# Patient Record
Sex: Male | Born: 1940
Health system: Southern US, Community
[De-identification: ages and names within clinical notes are randomized; demographics above are authoritative.]

## PROBLEM LIST (undated history)

## (undated) DIAGNOSIS — I509 Heart failure, unspecified: Secondary | ICD-10-CM

## (undated) DIAGNOSIS — I119 Hypertensive heart disease without heart failure: Secondary | ICD-10-CM

## (undated) DIAGNOSIS — R9431 Abnormal electrocardiogram [ECG] [EKG]: Secondary | ICD-10-CM

## (undated) DIAGNOSIS — I714 Abdominal aortic aneurysm, without rupture, unspecified: Secondary | ICD-10-CM

## (undated) DIAGNOSIS — K920 Hematemesis: Secondary | ICD-10-CM

## (undated) DIAGNOSIS — E739 Lactose intolerance, unspecified: Secondary | ICD-10-CM

## (undated) DIAGNOSIS — N183 Chronic kidney disease, stage 3 unspecified: Secondary | ICD-10-CM

## (undated) DIAGNOSIS — E785 Hyperlipidemia, unspecified: Secondary | ICD-10-CM

## (undated) DIAGNOSIS — I255 Ischemic cardiomyopathy: Secondary | ICD-10-CM

## (undated) DIAGNOSIS — I4819 Other persistent atrial fibrillation: Secondary | ICD-10-CM

## (undated) DIAGNOSIS — I2510 Atherosclerotic heart disease of native coronary artery without angina pectoris: Secondary | ICD-10-CM

## (undated) DIAGNOSIS — R001 Bradycardia, unspecified: Secondary | ICD-10-CM

## (undated) DIAGNOSIS — R7989 Other specified abnormal findings of blood chemistry: Secondary | ICD-10-CM

## (undated) DIAGNOSIS — I739 Peripheral vascular disease, unspecified: Secondary | ICD-10-CM

## (undated) DIAGNOSIS — I6529 Occlusion and stenosis of unspecified carotid artery: Secondary | ICD-10-CM

## (undated) DIAGNOSIS — K4090 Unilateral inguinal hernia, without obstruction or gangrene, not specified as recurrent: Secondary | ICD-10-CM

## (undated) DIAGNOSIS — I4901 Ventricular fibrillation: Secondary | ICD-10-CM

## (undated) DIAGNOSIS — I340 Nonrheumatic mitral (valve) insufficiency: Secondary | ICD-10-CM

## (undated) DIAGNOSIS — D509 Iron deficiency anemia, unspecified: Secondary | ICD-10-CM

## (undated) DIAGNOSIS — K5790 Diverticulosis of intestine, part unspecified, without perforation or abscess without bleeding: Secondary | ICD-10-CM

## (undated) DIAGNOSIS — R945 Abnormal results of liver function studies: Secondary | ICD-10-CM

## (undated) DIAGNOSIS — Z8673 Personal history of transient ischemic attack (TIA), and cerebral infarction without residual deficits: Secondary | ICD-10-CM

## (undated) HISTORY — DX: Atherosclerotic heart disease of native coronary artery without angina pectoris: I25.10

## (undated) HISTORY — DX: Diverticulosis of intestine, part unspecified, without perforation or abscess without bleeding: K57.90

## (undated) HISTORY — DX: Unilateral inguinal hernia, without obstruction or gangrene, not specified as recurrent: K40.90

## (undated) HISTORY — DX: Other persistent atrial fibrillation: I48.19

## (undated) HISTORY — DX: Peripheral vascular disease, unspecified: I73.9

## (undated) HISTORY — DX: Abdominal aortic aneurysm, without rupture, unspecified: I71.40

## (undated) HISTORY — DX: Hyperlipidemia, unspecified: E78.5

## (undated) HISTORY — DX: Abdominal aortic aneurysm, without rupture: I71.4

## (undated) HISTORY — DX: Iron deficiency anemia, unspecified: D50.9

## (undated) HISTORY — DX: Chronic kidney disease, stage 3 (moderate): N18.3

## (undated) HISTORY — DX: Personal history of transient ischemic attack (TIA), and cerebral infarction without residual deficits: Z86.73

## (undated) HISTORY — DX: Chronic kidney disease, stage 3 unspecified: N18.30

## (undated) HISTORY — DX: Hypertensive heart disease without heart failure: I11.9

## (undated) HISTORY — PX: HERNIA REPAIR: SHX51

## (undated) HISTORY — PX: SKIN GRAFT: SHX250

## (undated) HISTORY — DX: Lactose intolerance, unspecified: E73.9

---

## 1999-09-06 DIAGNOSIS — K5790 Diverticulosis of intestine, part unspecified, without perforation or abscess without bleeding: Secondary | ICD-10-CM

## 1999-09-06 HISTORY — DX: Diverticulosis of intestine, part unspecified, without perforation or abscess without bleeding: K57.90

## 2002-01-10 ENCOUNTER — Inpatient Hospital Stay (HOSPITAL_COMMUNITY): Admission: EM | Admit: 2002-01-10 | Discharge: 2002-01-14 | Payer: Self-pay | Admitting: Emergency Medicine

## 2002-01-10 ENCOUNTER — Encounter: Payer: Self-pay | Admitting: Emergency Medicine

## 2002-01-11 ENCOUNTER — Encounter: Payer: Self-pay | Admitting: Internal Medicine

## 2002-01-12 ENCOUNTER — Encounter: Payer: Self-pay | Admitting: Cardiology

## 2002-01-24 ENCOUNTER — Encounter: Admission: RE | Admit: 2002-01-24 | Discharge: 2002-01-24 | Payer: Self-pay | Admitting: Internal Medicine

## 2003-06-28 ENCOUNTER — Inpatient Hospital Stay (HOSPITAL_COMMUNITY): Admission: EM | Admit: 2003-06-28 | Discharge: 2003-07-01 | Payer: Self-pay | Admitting: Emergency Medicine

## 2003-06-28 ENCOUNTER — Encounter: Payer: Self-pay | Admitting: Emergency Medicine

## 2003-06-29 ENCOUNTER — Encounter: Payer: Self-pay | Admitting: Cardiology

## 2004-09-24 ENCOUNTER — Ambulatory Visit: Payer: Self-pay | Admitting: Cardiology

## 2004-09-27 ENCOUNTER — Ambulatory Visit: Payer: Self-pay | Admitting: Cardiology

## 2004-10-04 ENCOUNTER — Ambulatory Visit: Payer: Self-pay

## 2005-03-24 ENCOUNTER — Ambulatory Visit: Payer: Self-pay | Admitting: Cardiology

## 2005-03-31 ENCOUNTER — Ambulatory Visit: Payer: Self-pay | Admitting: Cardiology

## 2005-04-06 ENCOUNTER — Ambulatory Visit: Payer: Self-pay

## 2005-12-07 ENCOUNTER — Ambulatory Visit: Payer: Self-pay | Admitting: Cardiology

## 2006-01-26 ENCOUNTER — Ambulatory Visit: Payer: Self-pay

## 2006-01-26 ENCOUNTER — Ambulatory Visit: Payer: Self-pay | Admitting: Cardiology

## 2006-04-04 ENCOUNTER — Ambulatory Visit: Payer: Self-pay | Admitting: Cardiology

## 2006-08-03 ENCOUNTER — Ambulatory Visit: Payer: Self-pay | Admitting: Cardiology

## 2007-01-07 ENCOUNTER — Inpatient Hospital Stay (HOSPITAL_COMMUNITY): Admission: EM | Admit: 2007-01-07 | Discharge: 2007-01-15 | Payer: Self-pay | Admitting: *Deleted

## 2007-01-07 ENCOUNTER — Ambulatory Visit: Payer: Self-pay | Admitting: Internal Medicine

## 2007-01-07 ENCOUNTER — Ambulatory Visit: Payer: Self-pay | Admitting: Vascular Surgery

## 2007-01-07 HISTORY — PX: POPLITEAL ARTERY ANGIOPLASTY: SHX2242

## 2007-01-09 ENCOUNTER — Ambulatory Visit: Payer: Self-pay | Admitting: Physical Medicine & Rehabilitation

## 2007-01-11 HISTORY — PX: ORIF TIBIA & FIBULA FRACTURES: SHX2131

## 2007-01-19 ENCOUNTER — Ambulatory Visit: Payer: Self-pay | Admitting: Cardiology

## 2007-02-14 ENCOUNTER — Inpatient Hospital Stay (HOSPITAL_COMMUNITY): Admission: EM | Admit: 2007-02-14 | Discharge: 2007-02-15 | Payer: Self-pay | Admitting: Orthopedic Surgery

## 2007-04-30 ENCOUNTER — Encounter: Admission: RE | Admit: 2007-04-30 | Discharge: 2007-07-29 | Payer: Self-pay | Admitting: Orthopedic Surgery

## 2007-05-14 ENCOUNTER — Ambulatory Visit: Payer: Self-pay | Admitting: Vascular Surgery

## 2007-06-23 ENCOUNTER — Emergency Department (HOSPITAL_COMMUNITY): Admission: EM | Admit: 2007-06-23 | Discharge: 2007-06-23 | Payer: Self-pay | Admitting: *Deleted

## 2007-06-25 ENCOUNTER — Emergency Department (HOSPITAL_COMMUNITY): Admission: EM | Admit: 2007-06-25 | Discharge: 2007-06-25 | Payer: Self-pay | Admitting: *Deleted

## 2007-06-27 ENCOUNTER — Emergency Department (HOSPITAL_COMMUNITY): Admission: EM | Admit: 2007-06-27 | Discharge: 2007-06-27 | Payer: Self-pay | Admitting: Emergency Medicine

## 2007-07-05 ENCOUNTER — Ambulatory Visit: Payer: Self-pay | Admitting: Cardiology

## 2007-07-10 ENCOUNTER — Ambulatory Visit: Payer: Self-pay | Admitting: Cardiology

## 2007-07-10 ENCOUNTER — Ambulatory Visit: Payer: Self-pay

## 2007-07-10 LAB — CONVERTED CEMR LAB
Creatinine, Ser: 1.5 mg/dL (ref 0.4–1.5)
Potassium: 3.6 meq/L (ref 3.5–5.1)
Sodium: 139 meq/L (ref 135–145)

## 2007-08-13 ENCOUNTER — Ambulatory Visit (HOSPITAL_COMMUNITY): Admission: RE | Admit: 2007-08-13 | Discharge: 2007-08-13 | Payer: Self-pay | Admitting: General Surgery

## 2007-08-13 ENCOUNTER — Encounter (INDEPENDENT_AMBULATORY_CARE_PROVIDER_SITE_OTHER): Payer: Self-pay | Admitting: General Surgery

## 2008-10-02 DIAGNOSIS — E785 Hyperlipidemia, unspecified: Secondary | ICD-10-CM

## 2008-10-02 DIAGNOSIS — K4090 Unilateral inguinal hernia, without obstruction or gangrene, not specified as recurrent: Secondary | ICD-10-CM | POA: Insufficient documentation

## 2008-10-02 DIAGNOSIS — I679 Cerebrovascular disease, unspecified: Secondary | ICD-10-CM | POA: Insufficient documentation

## 2008-10-02 DIAGNOSIS — I10 Essential (primary) hypertension: Secondary | ICD-10-CM

## 2008-10-02 DIAGNOSIS — E739 Lactose intolerance, unspecified: Secondary | ICD-10-CM

## 2008-10-02 DIAGNOSIS — I739 Peripheral vascular disease, unspecified: Secondary | ICD-10-CM | POA: Insufficient documentation

## 2008-10-03 ENCOUNTER — Ambulatory Visit: Payer: Self-pay | Admitting: Cardiology

## 2008-11-12 ENCOUNTER — Ambulatory Visit: Payer: Self-pay

## 2008-11-12 ENCOUNTER — Ambulatory Visit: Payer: Self-pay | Admitting: Cardiology

## 2008-11-12 LAB — CONVERTED CEMR LAB
AST: 23 units/L (ref 0–37)
Albumin: 3.7 g/dL (ref 3.5–5.2)
Alkaline Phosphatase: 105 units/L (ref 39–117)
BUN: 28 mg/dL — ABNORMAL HIGH (ref 6–23)
Chloride: 111 meq/L (ref 96–112)
Cholesterol: 119 mg/dL (ref 0–200)
GFR calc non Af Amer: 50 mL/min
LDL Cholesterol: 77 mg/dL (ref 0–99)
Potassium: 3.7 meq/L (ref 3.5–5.1)
Total Bilirubin: 0.7 mg/dL (ref 0.3–1.2)

## 2009-08-10 ENCOUNTER — Encounter: Payer: Self-pay | Admitting: Cardiology

## 2009-08-11 ENCOUNTER — Ambulatory Visit: Payer: Self-pay | Admitting: Cardiology

## 2009-08-11 DIAGNOSIS — F17200 Nicotine dependence, unspecified, uncomplicated: Secondary | ICD-10-CM

## 2009-08-25 ENCOUNTER — Encounter (INDEPENDENT_AMBULATORY_CARE_PROVIDER_SITE_OTHER): Payer: Self-pay | Admitting: *Deleted

## 2009-09-15 ENCOUNTER — Ambulatory Visit: Payer: Self-pay | Admitting: Cardiology

## 2009-09-15 ENCOUNTER — Ambulatory Visit: Payer: Self-pay

## 2009-09-15 DIAGNOSIS — E875 Hyperkalemia: Secondary | ICD-10-CM | POA: Insufficient documentation

## 2009-09-18 LAB — CONVERTED CEMR LAB
CO2: 24 meq/L (ref 19–32)
Calcium: 9.3 mg/dL (ref 8.4–10.5)
Creatinine, Ser: 1.49 mg/dL (ref 0.40–1.50)
Glucose, Bld: 63 mg/dL — ABNORMAL LOW (ref 70–99)

## 2009-09-22 LAB — CONVERTED CEMR LAB
AST: 17 units/L (ref 0–37)
Alkaline Phosphatase: 106 units/L (ref 39–117)
BUN: 26 mg/dL — ABNORMAL HIGH (ref 6–23)
Bilirubin, Direct: 0 mg/dL (ref 0.0–0.3)
CO2: 26 meq/L (ref 19–32)
Calcium: 9.6 mg/dL (ref 8.4–10.5)
Creatinine, Ser: 1.8 mg/dL — ABNORMAL HIGH (ref 0.4–1.5)
Glucose, Bld: 94 mg/dL (ref 70–99)
Total Bilirubin: 0.6 mg/dL (ref 0.3–1.2)
Total CHOL/HDL Ratio: 7

## 2010-08-13 ENCOUNTER — Ambulatory Visit: Payer: Self-pay | Admitting: Cardiology

## 2010-08-13 ENCOUNTER — Encounter: Payer: Self-pay | Admitting: Cardiology

## 2010-08-18 ENCOUNTER — Ambulatory Visit: Payer: Self-pay

## 2010-08-19 ENCOUNTER — Encounter (INDEPENDENT_AMBULATORY_CARE_PROVIDER_SITE_OTHER): Payer: Self-pay | Admitting: *Deleted

## 2010-08-26 ENCOUNTER — Ambulatory Visit: Payer: Self-pay | Admitting: Cardiology

## 2010-08-26 ENCOUNTER — Telehealth: Payer: Self-pay | Admitting: Cardiology

## 2010-08-26 DIAGNOSIS — N259 Disorder resulting from impaired renal tubular function, unspecified: Secondary | ICD-10-CM | POA: Insufficient documentation

## 2010-08-26 LAB — CONVERTED CEMR LAB
Albumin: 3.7 g/dL (ref 3.5–5.2)
CO2: 26 meq/L (ref 19–32)
Calcium: 9.5 mg/dL (ref 8.4–10.5)
Chloride: 106 meq/L (ref 96–112)
Cholesterol: 118 mg/dL (ref 0–200)
Direct LDL: 76.3 mg/dL
HDL: 26 mg/dL — ABNORMAL LOW (ref >39.00)
Sodium: 140 meq/L (ref 135–145)
Total Protein: 6.4 g/dL (ref 6.0–8.3)
Triglycerides: 203 mg/dL — ABNORMAL HIGH (ref 0.0–149.0)

## 2010-09-01 LAB — CONVERTED CEMR LAB
BUN: 15 mg/dL (ref 6–23)
CO2: 25 meq/L (ref 19–32)
Chloride: 106 meq/L (ref 96–112)
Creatinine, Ser: 1.5 mg/dL (ref 0.4–1.5)
Glucose, Bld: 75 mg/dL (ref 70–99)

## 2010-10-04 ENCOUNTER — Ambulatory Visit: Admit: 2010-10-04 | Payer: Self-pay | Admitting: Cardiology

## 2010-10-05 NOTE — Assessment & Plan Note (Signed)
Summary: yearly/sl   History of Present Illness: Logan White is a very pleasant gentleman who has a history of PCI of his LAD in October 2004.  His last Myoview was performed on November 12, 2008. Ejection fraction was 47%. There was a small area of potential ischemia at the infero-apex. It was felt to be low risk and we are treating medically.  He also has cerebrovascular disease. Last carotid Doppler performed in Jan 2011 showed 60-79% bilateral stenosis. Followup was recommended in 12 months. Note abdominal ultrasound in May of 2007 showed no aneurysm. I last saw him in Dec 2010. Since then the patient denies any dyspnea on exertion, orthopnea, PND, pedal edema, palpitations, syncope or chest pain.  Current Medications (verified): 1)  Hydrochlorothiazide 12.5 Mg Tabs (Hydrochlorothiazide) .... Take 1 Tablet By Mouth Once A  Day 2)  Lisinopril 40 Mg Tabs (Lisinopril) .... Take 1 Tablet By Mouth Once A Day 3)  Zocor 40 Mg Tabs (Simvastatin) .... Take 1 Tablet By Mouth At Bedtime 4)  Aspirin 81 Mg Tbec (Aspirin) .... Take One Tablet By Mouth Daily  Past History:  Past Medical History: Reviewed history from 10/02/2008 and no changes required. INGUINAL HERNIA (ICD-550.90) chronic GLUCOSE INTOLERANCE (ICD-271.3)question of DM PERIPHERAL VASCULAR DISEASE (ICD-443.9) CEREBROVASCULAR DISEASE (ICD-437.9) HYPERLIPIDEMIA-MIXED (ICD-272.4) HYPERTENSION (ICD-401.9) CAD (ICD-414.01 Status post aborted anterior ST elevation myocardial        infarction in October of 2004, with PTCA and stenting with a        CYPHER drug-eluting stent to the LAD.  There was normal ejection        fraction.   Past Surgical History: Reviewed history from 10/02/2008 and no changes required.  PROCEDURES:   1. Status post left popliteal artery exploration and vein patch       angioplasty, per Dr. Donnetta Hutching, secondary to ischemic left foot related       to left popliteal artery injury on Jan 07, 2007.   2. Irrigation and  debridement of left open proximal tibia fracture       with significant soft tissue injury noted and I & D of soft tissue       and muscle over the left forearm with closure over drain on Jan 07, 2007.   3. On Jan 11, 2007, open reduction internal fixation, tibia shaft, open       treatment of unicondylar plateau fracture, irrigation/debridement       of open fracture including bone, removal of external fixator under       anesthesia per Dr. Altamese Lennox  Social History: Reviewed history from 10/02/2008 and no changes required. 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 half to one pack of cigarettes a day Alcohol Use - no  Review of Systems       no fevers or chills, productive cough, hemoptysis, dysphasia, odynophagia, melena, hematochezia, dysuria, hematuria, rash, seizure activity, orthopnea, PND, pedal edema, claudication. Remaining systems are negative.   Vital Signs:  Patient profile:   70 year old male Height:      75 inches Weight:      183 pounds BMI:     22.96 Pulse rate:   53 / minute Resp:     14 per minute BP sitting:   119 / 65  (left arm)  Vitals Entered By: Burnett Kanaris (August 13, 2010 9:24 AM)  Physical Exam  General:  Well-developed well-nourished in no acute distress.  Skin is warm and dry.  HEENT is normal.  Neck is supple. No thyromegaly.  Chest is clear to auscultation with normal expansion.  Cardiovascular exam is regular rate and rhythm. 2/6 systolic ejection murmur. Abdominal exam nontender or distended. No masses palpated. Extremities show no edema. neuro grossly intact    EKG  Procedure date:  08/13/2010  Findings:      Sus bradycardia with first degree AV block. Nonspecific ST changes.  Impression & Recommendations:  Problem # 1:  TOBACCO ABUSE (ICD-305.1) Patient counseled on discontinuing.  Problem # 2:  PERIPHERAL VASCULAR DISEASE (ICD-443.9) Patient with known cerebrovascular disease.  Followup carotid Dopplers January 2012. Continue aspirin and statin.  Problem # 3:  HYPERLIPIDEMIA-MIXED (P102836.4)  Continue statin. Check lipids and liver. The following medications were removed from the medication list:    Lipitor 40 Mg Tabs (Atorvastatin calcium) .Marland Kitchen... Take one tablet by mouth daily. His updated medication list for this problem includes:    Zocor 40 Mg Tabs (Simvastatin) .Marland Kitchen... Take 1 tablet by mouth at bedtime  Orders: TLB-Hepatic/Liver Function Pnl (80076-HEPATIC) TLB-BMP (Basic Metabolic Panel-BMET) (99991111)  The following medications were removed from the medication list:    Lipitor 40 Mg Tabs (Atorvastatin calcium) .Marland Kitchen... Take one tablet by mouth daily. His updated medication list for this problem includes:    Zocor 40 Mg Tabs (Simvastatin) .Marland Kitchen... Take 1 tablet by mouth at bedtime  Problem # 4:  HYPERTENSION (ICD-401.9) Blood pressure controlled on present medications. Will continue. Check potassium and renal function. His updated medication list for this problem includes:    Hydrochlorothiazide 12.5 Mg Tabs (Hydrochlorothiazide) .Marland Kitchen... Take 1 tablet by mouth once a  day    Lisinopril 40 Mg Tabs (Lisinopril) .Marland Kitchen... Take 1 tablet by mouth once a day    Aspirin 81 Mg Tbec (Aspirin) .Marland Kitchen... Take one tablet by mouth daily  Problem # 5:  CAD (ICD-414.00) Continue aspirin, ACE inhibitor and statin. His updated medication list for this problem includes:    Lisinopril 40 Mg Tabs (Lisinopril) .Marland Kitchen... Take 1 tablet by mouth once a day    Aspirin 81 Mg Tbec (Aspirin) .Marland Kitchen... Take one tablet by mouth daily  Orders: TLB-BMP (Basic Metabolic Panel-BMET) (99991111)  His updated medication list for this problem includes:    Lisinopril 40 Mg Tabs (Lisinopril) .Marland Kitchen... Take 1 tablet by mouth once a day    Aspirin 81 Mg Tbec (Aspirin) .Marland Kitchen... Take one tablet by mouth daily  Other Orders: Carotid Duplex (Carotid Duplex)  Patient Instructions: 1)  Your physician  recommends that you schedule a follow-up appointment in: 1year with Dr. [name]. 2)  Your physician recommends that you return for lab work in: Today for Liver and Lipid panel as well as a BMET 3)  Your physician has requested that you have a carotid duplex. Dr. [name] would like for you to have a Carotid Duplex done in 09/2010. This test is an ultrasound of the carotid arteries in your neck. It looks at blood flow through these arteries that supply the brain with blood. Allow one hour for this exam. There are no restrictions or special instructions.

## 2010-10-07 NOTE — Miscellaneous (Signed)
Summary: RX Niaspan 500mg  for  4 weeks/1000mg  thereafter  Clinical Lists Changes  Medications: Added new medication of NIASPAN 500 MG CR-TABS (NIACIN (ANTIHYPERLIPIDEMIC)) one at bedtime for 4 weeks then increase to 1000 mg thereafter - Signed Added new medication of NIASPAN 1000 MG CR-TABS (NIACIN (ANTIHYPERLIPIDEMIC)) one every evening after completing 4 weeks of 500 mg - Signed Rx of NIASPAN 500 MG CR-TABS (NIACIN (ANTIHYPERLIPIDEMIC)) one at bedtime for 4 weeks then increase to 1000 mg thereafter;  #30 x 0;  Signed;  Entered by: Sim Boast, RN;  Authorized by: Colin Mulders, MD, [facility];  Method used: Faxed to [facility] [address] [place], [place]  [address], Ph: [phone], Fax: [phone] Rx of NIASPAN 1000 MG CR-TABS (NIACIN (ANTIHYPERLIPIDEMIC)) one every evening after completing 4 weeks of 500 mg;  #30 x 11;  Signed;  Entered by: Sim Boast, RN;  Authorized by: Colin Mulders, MD, [facility];  Method used: Faxed to [facility] [address], [address] [place], [place]  [address], Ph: [phone], Fax: [phone]    Prescriptions: NIASPAN 1000 MG CR-TABS (NIACIN (ANTIHYPERLIPIDEMIC)) one every evening after completing 4 weeks of 500 mg  #30 x 11   Entered by:   Sim Boast, RN   Authorized by:   Colin Mulders, MD, [facility]   Signed by:   Sim Boast, RN on 08/26/2010   Method used:   Faxed to ...       [facility] [phone] (retail)       [place], [place]  [address]       Ph: [phone]       Fax: [phone]   RxID:   [id number] NIASPAN 500 MG CR-TABS (NIACIN (ANTIHYPERLIPIDEMIC)) one at bedtime for 4 weeks then increase to 1000 mg thereafter  #30 x 0   Entered by:   Sim Boast, RN   Authorized by:   Colin Mulders, MD, [facility]   Signed by:   Sim Boast, RN on 08/26/2010   Method used:   Faxed to ...       [facility] [address] (retail)       [address]       [place], [place]  [address]       Ph: [phone]       Fax: [phone]   RxID:   [id number]

## 2010-10-07 NOTE — Letter (Signed)
Summary: [facility] [place] [address] [place]   [place], [place] [address]   Phone: [phone]  Fax: [phone]     August 19, 2010 MRN: [id number]   Logan White [place], [place]  [address]   Dear Mr. ABELLERA,  We have reviewed your cholesterol results.  They are as follows:     Total Cholesterol:    118 (Desirable: less than 200)       HDL  Cholesterol:     26.00  (Desirable: greater than 40 for men and 50 for women)       LDL Cholesterol:       136  (Desirable: less than 100 for low risk and less than 70 for moderate to high risk)       Triglycerides:       203.0  (Desirable: less than 150)  Our recommendations include:THESE NUMBERS LOOK GOOD EXCEPT FOR THE HDL. DR CRENSHAW WOULD LIKE FOR YOU TO TAKE A MEDICATION THAT CAN RAISE THE HDL NUMBER. PLEASE CALL SO WE CAN DISCUSS. DR CRENSHAW/DEBRA   Call our office at the number listed above if you have any questions.  Lowering your LDL cholesterol is important, but it is only one of a large number of "risk factors" that may indicate that you are at risk for heart disease, stroke or other complications of hardening of the arteries.  Other risk factors include:   A.  Cigarette Smoking* B.  High Blood Pressure* C.  Obesity* D.   Low HDL Cholesterol (see yours above)* E.   Diabetes Mellitus (higher risk if your is uncontrolled) F.  Family history of premature heart disease G.  Previous history of stroke or cardiovascular disease    *These are risk factors YOU HAVE CONTROL OVER.  For more information, visit .  There is now evidence that lowering the TOTAL CHOLESTEROL AND LDL CHOLESTEROL can reduce the risk of heart disease.  The American Heart Association recommends the following guidelines for the treatment of elevated cholesterol:  1.  If there is now current heart disease and less than two risk factors, TOTAL CHOLESTEROL should be less than 200 and LDL CHOLESTEROL  should be less than 100. 2.  If there is current heart disease or two or more risk factors, TOTAL CHOLESTEROL should be less than 200 and LDL CHOLESTEROL should be less than 70.  A diet low in cholesterol, saturated fat, and calories is the cornerstone of treatment for elevated cholesterol.  Cessation of smoking and exercise are also important in the management of elevated cholesterol and preventing vascular disease.  Studies have shown that 30 to 60 minutes of physical activity most days can help lower blood pressure, lower cholesterol, and keep your weight at a healthy level.  Drug therapy is used when cholesterol levels do not respond to therapeutic lifestyle changes (smoking cessation, diet, and exercise) and remains unacceptably high.  If medication is started, it is important to have you levels checked periodically to evaluate the need for further treatment options.  Thank you,    [facility]

## 2010-10-07 NOTE — Progress Notes (Signed)
Summary: Problems with "sex drive"  Phone Note Other Incoming   Caller: pt Details for Reason: Problems with "sex drive" Summary of Call: pt here for labs and complains of difficulty with his "sex drive".  Would like to know if his medication would be causing this and if he can possibly use Viagra or Cialis.  Pt aware Dr [name] not in the office today but that I will forward this information to him for his review and we  will contact him in follow-up.  Pt is in agreement. Initial call taken by: Sim Boast, RN,  August 26, 2010 11:10 AM  Follow-up for Phone Call        pt not on beta blocker; may take viagra but would need to get from his primary care; instruct pt to avoid ntg for 24 hours after taking Colin Mulders, MD, [facility]  August 26, 2010 12:01 PM  pt aware Fredia Beets, RN  September 01, 2010 10:56 AM

## 2010-12-28 ENCOUNTER — Inpatient Hospital Stay (HOSPITAL_COMMUNITY)
Admission: EM | Admit: 2010-12-28 | Discharge: 2011-01-01 | DRG: 690 | Disposition: A | Discharge status: Home / Self Care | Payer: Medicare Other | Attending: Internal Medicine | Admitting: Internal Medicine

## 2010-12-28 DIAGNOSIS — Z23 Encounter for immunization: Secondary | ICD-10-CM

## 2010-12-28 DIAGNOSIS — N181 Chronic kidney disease, stage 1: Secondary | ICD-10-CM | POA: Diagnosis present

## 2010-12-28 DIAGNOSIS — Z87891 Personal history of nicotine dependence: Secondary | ICD-10-CM

## 2010-12-28 DIAGNOSIS — I129 Hypertensive chronic kidney disease with stage 1 through stage 4 chronic kidney disease, or unspecified chronic kidney disease: Secondary | ICD-10-CM | POA: Diagnosis present

## 2010-12-28 DIAGNOSIS — I252 Old myocardial infarction: Secondary | ICD-10-CM

## 2010-12-28 DIAGNOSIS — I4891 Unspecified atrial fibrillation: Secondary | ICD-10-CM | POA: Diagnosis present

## 2010-12-28 DIAGNOSIS — E119 Type 2 diabetes mellitus without complications: Secondary | ICD-10-CM | POA: Diagnosis present

## 2010-12-28 DIAGNOSIS — Z7982 Long term (current) use of aspirin: Secondary | ICD-10-CM

## 2010-12-28 DIAGNOSIS — A088 Other specified intestinal infections: Secondary | ICD-10-CM | POA: Diagnosis present

## 2010-12-28 DIAGNOSIS — R1310 Dysphagia, unspecified: Secondary | ICD-10-CM | POA: Diagnosis present

## 2010-12-28 DIAGNOSIS — Z8249 Family history of ischemic heart disease and other diseases of the circulatory system: Secondary | ICD-10-CM

## 2010-12-28 DIAGNOSIS — I4892 Unspecified atrial flutter: Secondary | ICD-10-CM | POA: Diagnosis not present

## 2010-12-28 DIAGNOSIS — Z79899 Other long term (current) drug therapy: Secondary | ICD-10-CM

## 2010-12-28 DIAGNOSIS — Z9861 Coronary angioplasty status: Secondary | ICD-10-CM

## 2010-12-28 DIAGNOSIS — E876 Hypokalemia: Secondary | ICD-10-CM | POA: Diagnosis not present

## 2010-12-28 DIAGNOSIS — E860 Dehydration: Secondary | ICD-10-CM | POA: Diagnosis present

## 2010-12-28 DIAGNOSIS — Z8781 Personal history of (healed) traumatic fracture: Secondary | ICD-10-CM

## 2010-12-28 DIAGNOSIS — R319 Hematuria, unspecified: Secondary | ICD-10-CM | POA: Diagnosis present

## 2010-12-28 DIAGNOSIS — I739 Peripheral vascular disease, unspecified: Secondary | ICD-10-CM | POA: Diagnosis present

## 2010-12-28 DIAGNOSIS — N390 Urinary tract infection, site not specified: Principal | ICD-10-CM | POA: Diagnosis present

## 2010-12-28 DIAGNOSIS — E785 Hyperlipidemia, unspecified: Secondary | ICD-10-CM | POA: Diagnosis present

## 2010-12-28 DIAGNOSIS — I2510 Atherosclerotic heart disease of native coronary artery without angina pectoris: Secondary | ICD-10-CM | POA: Diagnosis present

## 2010-12-28 DIAGNOSIS — Z8673 Personal history of transient ischemic attack (TIA), and cerebral infarction without residual deficits: Secondary | ICD-10-CM

## 2010-12-28 LAB — COMPREHENSIVE METABOLIC PANEL
ALT: 27 U/L (ref 0–53)
Alkaline Phosphatase: 152 U/L — ABNORMAL HIGH (ref 39–117)
CO2: 26 mEq/L (ref 19–32)
Calcium: 10.4 mg/dL (ref 8.4–10.5)
GFR calc non Af Amer: 36 mL/min — ABNORMAL LOW (ref >60)
Glucose, Bld: 174 mg/dL — ABNORMAL HIGH (ref 70–99)
Sodium: 141 mEq/L (ref 135–145)

## 2010-12-28 LAB — URINALYSIS, ROUTINE W REFLEX MICROSCOPIC
Nitrite: NEGATIVE
Specific Gravity, Urine: 1.039 — ABNORMAL HIGH (ref 1.005–1.030)
Urobilinogen, UA: 0.2 mg/dL (ref 0.0–1.0)

## 2010-12-28 LAB — CBC
Platelets: 236 10*3/uL (ref 150–400)
RDW: 14.4 % (ref 11.5–15.5)
WBC: 8.8 10*3/uL (ref 4.0–10.5)

## 2010-12-28 LAB — URINE MICROSCOPIC-ADD ON

## 2010-12-29 DIAGNOSIS — I369 Nonrheumatic tricuspid valve disorder, unspecified: Secondary | ICD-10-CM

## 2010-12-29 LAB — CBC
HCT: 50 % (ref 39.0–52.0)
HCT: 44.7 % (ref 39.0–52.0)
Hemoglobin: 15.8 g/dL (ref 13.0–17.0)
MCH: 26.7 pg (ref 26.0–34.0)
MCH: 26 pg (ref 26.0–34.0)
MCV: 73.4 fL — ABNORMAL LOW (ref 78.0–100.0)
MCV: 73.6 fL — ABNORMAL LOW (ref 78.0–100.0)
Platelets: 265 10*3/uL (ref 150–400)
Platelets: 259 10*3/uL (ref 150–400)
Platelets: 261 10*3/uL (ref 150–400)
RBC: 6.51 MIL/uL — ABNORMAL HIGH (ref 4.22–5.81)
RBC: 6.07 MIL/uL — ABNORMAL HIGH (ref 4.22–5.81)
RDW: 14.4 % (ref 11.5–15.5)
RDW: 14.5 % (ref 11.5–15.5)
WBC: 10.3 10*3/uL (ref 4.0–10.5)
WBC: 12.9 10*3/uL — ABNORMAL HIGH (ref 4.0–10.5)
WBC: 12.1 10*3/uL — ABNORMAL HIGH (ref 4.0–10.5)

## 2010-12-29 LAB — DIFFERENTIAL
Basophils Absolute: 0 10*3/uL (ref 0.0–0.1)
Basophils Absolute: 0.1 10*3/uL (ref 0.0–0.1)
Basophils Relative: 1 % (ref 0–1)
Eosinophils Absolute: 0 10*3/uL (ref 0.0–0.7)
Eosinophils Relative: 0 % (ref 0–5)
Lymphocytes Relative: 21 % (ref 12–46)
Lymphs Abs: 1.4 10*3/uL (ref 0.7–4.0)
Lymphs Abs: 2.2 10*3/uL (ref 0.7–4.0)
Monocytes Absolute: 0.4 10*3/uL (ref 0.1–1.0)
Monocytes Relative: 5 % (ref 3–12)
Monocytes Relative: 11 % (ref 3–12)
Neutro Abs: 6.9 10*3/uL (ref 1.7–7.7)
Neutrophils Relative %: 67 % (ref 43–77)
nRBC: 0 /100 WBC

## 2010-12-29 LAB — LIPID PANEL
Cholesterol: 222 mg/dL — ABNORMAL HIGH (ref 0–200)
LDL Cholesterol: 150 mg/dL — ABNORMAL HIGH (ref 0–99)
Total CHOL/HDL Ratio: 3.7 RATIO

## 2010-12-29 LAB — CK TOTAL AND CKMB (NOT AT ARMC)
CK, MB: 9.8 ng/mL (ref 0.3–4.0)
Relative Index: 2.7 — ABNORMAL HIGH (ref 0.0–2.5)
Total CK: 367 U/L — ABNORMAL HIGH (ref 7–232)

## 2010-12-29 LAB — CARDIAC PANEL(CRET KIN+CKTOT+MB+TROPI)
Total CK: 814 U/L — ABNORMAL HIGH (ref 7–232)
Troponin I: 0.06 ng/mL (ref 0.00–0.06)

## 2010-12-29 LAB — GLUCOSE, CAPILLARY
Glucose-Capillary: 110 mg/dL — ABNORMAL HIGH (ref 70–99)
Glucose-Capillary: 136 mg/dL — ABNORMAL HIGH (ref 70–99)

## 2010-12-29 LAB — SAMPLE TO BLOOD BANK

## 2010-12-29 LAB — TROPONIN I: Troponin I: 0.05 ng/mL (ref 0.00–0.06)

## 2010-12-29 LAB — HEMOGLOBIN A1C
Hgb A1c MFr Bld: 5.7 % — ABNORMAL HIGH (ref <5.7)
Mean Plasma Glucose: 117 mg/dL — ABNORMAL HIGH (ref <117)

## 2010-12-29 LAB — APTT: aPTT: 33 seconds (ref 24–37)

## 2010-12-29 LAB — MRSA PCR SCREENING: MRSA by PCR: POSITIVE — AB

## 2010-12-30 LAB — COMPREHENSIVE METABOLIC PANEL
ALT: 22 U/L (ref 0–53)
Alkaline Phosphatase: 119 U/L — ABNORMAL HIGH (ref 39–117)
Chloride: 98 mEq/L (ref 96–112)
Glucose, Bld: 128 mg/dL — ABNORMAL HIGH (ref 70–99)
Potassium: 3.7 mEq/L (ref 3.5–5.1)
Sodium: 139 mEq/L (ref 135–145)
Total Bilirubin: 1 mg/dL (ref 0.3–1.2)
Total Protein: 7.6 g/dL (ref 6.0–8.3)

## 2010-12-30 LAB — CBC
HCT: 46 % (ref 39.0–52.0)
Hemoglobin: 16.8 g/dL (ref 13.0–17.0)
RBC: 6.23 MIL/uL — ABNORMAL HIGH (ref 4.22–5.81)
RDW: 14.5 % (ref 11.5–15.5)
WBC: 13.3 10*3/uL — ABNORMAL HIGH (ref 4.0–10.5)

## 2010-12-30 LAB — GLUCOSE, CAPILLARY
Glucose-Capillary: 111 mg/dL — ABNORMAL HIGH (ref 70–99)
Glucose-Capillary: 123 mg/dL — ABNORMAL HIGH (ref 70–99)
Glucose-Capillary: 127 mg/dL — ABNORMAL HIGH (ref 70–99)

## 2010-12-31 ENCOUNTER — Inpatient Hospital Stay (HOSPITAL_COMMUNITY): Payer: Medicare Other

## 2010-12-31 DIAGNOSIS — I4891 Unspecified atrial fibrillation: Secondary | ICD-10-CM

## 2010-12-31 LAB — URINALYSIS, ROUTINE W REFLEX MICROSCOPIC
Glucose, UA: NEGATIVE mg/dL
Ketones, ur: NEGATIVE mg/dL
Leukocytes, UA: NEGATIVE
Nitrite: NEGATIVE
Specific Gravity, Urine: 1.024 (ref 1.005–1.030)
pH: 6 (ref 5.0–8.0)

## 2010-12-31 LAB — URINE MICROSCOPIC-ADD ON

## 2010-12-31 LAB — GLUCOSE, CAPILLARY
Glucose-Capillary: 118 mg/dL — ABNORMAL HIGH (ref 70–99)
Glucose-Capillary: 113 mg/dL — ABNORMAL HIGH (ref 70–99)
Glucose-Capillary: 119 mg/dL — ABNORMAL HIGH (ref 70–99)

## 2010-12-31 LAB — BASIC METABOLIC PANEL
CO2: 25 mEq/L (ref 19–32)
Calcium: 9 mg/dL (ref 8.4–10.5)
Chloride: 102 mEq/L (ref 96–112)
GFR calc Af Amer: >60 mL/min (ref >60)
Sodium: 136 mEq/L (ref 135–145)

## 2010-12-31 LAB — CBC
Hemoglobin: 17.3 g/dL — ABNORMAL HIGH (ref 13.0–17.0)
Platelets: 255 10*3/uL (ref 150–400)
RBC: 6.42 MIL/uL — ABNORMAL HIGH (ref 4.22–5.81)

## 2011-01-01 LAB — BASIC METABOLIC PANEL
Calcium: 8.8 mg/dL (ref 8.4–10.5)
Creatinine, Ser: 1.21 mg/dL (ref 0.4–1.5)
GFR calc Af Amer: >60 mL/min (ref >60)
GFR calc non Af Amer: 60 mL/min — ABNORMAL LOW (ref >60)
Sodium: 136 mEq/L (ref 135–145)

## 2011-01-01 LAB — GLUCOSE, CAPILLARY: Glucose-Capillary: 103 mg/dL — ABNORMAL HIGH (ref 70–99)

## 2011-01-01 LAB — MAGNESIUM: Magnesium: 2 mg/dL (ref 1.5–2.5)

## 2011-01-01 LAB — CBC
Hemoglobin: 16.5 g/dL (ref 13.0–17.0)
MCH: 26.6 pg (ref 26.0–34.0)
MCHC: 35.9 g/dL (ref 30.0–36.0)
Platelets: 249 10*3/uL (ref 150–400)
RDW: 14.4 % (ref 11.5–15.5)

## 2011-01-02 LAB — URINE CULTURE
Culture: NO GROWTH
Special Requests: NEGATIVE
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
## 2011-01-11 ENCOUNTER — Encounter: Payer: Self-pay | Admitting: Physician Assistant

## 2011-01-13 ENCOUNTER — Encounter: Payer: Self-pay | Admitting: Physician Assistant

## 2011-01-13 ENCOUNTER — Ambulatory Visit (INDEPENDENT_AMBULATORY_CARE_PROVIDER_SITE_OTHER): Payer: Medicare Other | Admitting: Physician Assistant

## 2011-01-13 ENCOUNTER — Telehealth: Payer: Self-pay | Admitting: *Deleted

## 2011-01-13 VITALS — BP 158/80 | HR 50 | Resp 18 | Ht 75.0 in | Wt 175.8 lb

## 2011-01-13 DIAGNOSIS — R0989 Other specified symptoms and signs involving the circulatory and respiratory systems: Secondary | ICD-10-CM

## 2011-01-13 DIAGNOSIS — I10 Essential (primary) hypertension: Secondary | ICD-10-CM

## 2011-01-13 DIAGNOSIS — R0609 Other forms of dyspnea: Secondary | ICD-10-CM

## 2011-01-13 DIAGNOSIS — N259 Disorder resulting from impaired renal tubular function, unspecified: Secondary | ICD-10-CM

## 2011-01-13 DIAGNOSIS — E785 Hyperlipidemia, unspecified: Secondary | ICD-10-CM

## 2011-01-13 DIAGNOSIS — I4891 Unspecified atrial fibrillation: Secondary | ICD-10-CM

## 2011-01-13 DIAGNOSIS — R0600 Dyspnea, unspecified: Secondary | ICD-10-CM

## 2011-01-13 DIAGNOSIS — I679 Cerebrovascular disease, unspecified: Secondary | ICD-10-CM

## 2011-01-13 DIAGNOSIS — K920 Hematemesis: Secondary | ICD-10-CM

## 2011-01-13 LAB — BASIC METABOLIC PANEL
GFR: 57.67 mL/min — ABNORMAL LOW (ref >60.00)
Potassium: 4.2 mEq/L (ref 3.5–5.1)
Sodium: 143 mEq/L (ref 135–145)

## 2011-01-13 LAB — BRAIN NATRIURETIC PEPTIDE: Pro B Natriuretic peptide (BNP): 446 pg/mL — ABNORMAL HIGH (ref 0.0–100.0)

## 2011-01-13 MED ORDER — FAMOTIDINE 20 MG PO TABS: 20.0000 mg | ORAL_TABLET | Freq: Two times a day (BID) | ORAL | Status: DC

## 2011-01-13 MED ORDER — POTASSIUM CHLORIDE ER 10 MEQ PO TBCR: 10.0000 meq | EXTENDED_RELEASE_TABLET | Freq: Every day | ORAL | Status: DC

## 2011-01-13 MED ORDER — SIMVASTATIN 40 MG PO TABS: 40.0000 mg | ORAL_TABLET | Freq: Every evening | ORAL | Status: DC

## 2011-01-13 MED ORDER — FUROSEMIDE 40 MG PO TABS: 40.0000 mg | ORAL_TABLET | Freq: Every day | ORAL | Status: DC

## 2011-01-13 MED ORDER — DILTIAZEM HCL ER COATED BEADS 180 MG PO CP24: 240.0000 mg | ORAL_CAPSULE | Freq: Every day | ORAL | Status: DC

## 2011-01-13 NOTE — Assessment & Plan Note (Signed)
He is maintaining normal sinus rhythm.  He is somewhat bradycardic.  This may be contributing to his dyspnea and I have recommended decreasing his Cardizem to 180 mg daily.  He has a high stroke risk profile.  His CHADS2-VASc score is 3.  However with his recent hematemesis I am not certain that Coumadin should be started currently.  I would like him to see gastroenterology as his problems with nausea and vomiting seemed to be more chronic than just gastroenteritis.  I will place him on a 48-hour Holter monitor to assess for recurrent atrial fibrillation.  He can continue on aspirin for now.  I will bring him back to see Dr. [name] in several weeks.

## 2011-01-13 NOTE — Assessment & Plan Note (Signed)
Follow up with a basic metabolic panel today.  If his renal function is stable, I will try to reinitiate his ACE inhibitor for blood pressure control.

## 2011-01-13 NOTE — Assessment & Plan Note (Signed)
His lipid panel during his hospitalization was as follows: TC 222, TG 61, HDL 60, LDL 150.  His simvastatin was held at discharge for unclear reasons.  He will restart this and we will need to arrange follow up on his lipids his future appointment.

## 2011-01-13 NOTE — Patient Instructions (Addendum)
Your physician recommends that you schedule a follow-up appointment in: [place] DR. CRENSHAW AS PER SCOTT WEAVER, PA-C.  Your physician has requested that you have a carotid duplex 437.9 . This test is an ultrasound of the carotid arteries in your neck. It looks at blood flow through these arteries that supply the brain with blood. Allow one hour for this exam. There are no restrictions or special instructions.  Your physician recommends that you return for lab work in: [place], BNP, [place] 786.05  Your physician has recommended you make the following change in your medication: DECREASE CARDIZEM TO 180 MG 1 TABLET ONCE DAILY. START PEPCID 20 MG 1 TABLET TWICE DAILY.  Your physician has recommended that you wear a 48 HOUR holter monitor 427.31. Holter monitors are medical devices that record the heart's electrical activity. Doctors most often use these monitors to diagnose arrhythmias. Arrhythmias are problems with the speed or rhythm of the heartbeat. The monitor is a small, portable device. You can wear one while you do your normal daily activities. This is usually used to diagnose what is causing palpitations/syncope (passing out).  You have been referred to GI FOR NAUSEA AN VOMITING, [place] 4/24/-01/01/11 WITH HEMATEMESIS

## 2011-01-13 NOTE — Assessment & Plan Note (Addendum)
As noted, I think he needs evaluation with gastroenterology.  He has had problems with nausea and vomiting in the past as well as the episode that sent him to the hospital.  He denies any dysphagia or odynophagia.  He's had some questionable weight loss.  His barium swallow in the hospital was normal.  I've asked him to start taking Pepcid 20 mg twice a day.  It is possible he had a MW tear from the vomiting.  But, with the need for coumadin, I think he should be seen by GI first.

## 2011-01-13 NOTE — Assessment & Plan Note (Signed)
As noted, if his renal function is stable, I will restart his ACE inhibitor.

## 2011-01-13 NOTE — Telephone Encounter (Signed)
Repeat bmet, bnp 01/21/11

## 2011-01-13 NOTE — Progress Notes (Signed)
History of Present Illness: Primary Cardiologist: Dr. Kirk Ruths  Logan White is a 70 y.o. male with a history of PCI of his LAD in October 2004.  His last Myoview was performed on November 12, 2008. Ejection fraction was 47%. There was a small area of potential ischemia at the infero-apex. It was felt to be low risk and we are treating medically.  He also has cerebrovascular disease. Last carotid Doppler performed in Jan 2011 showed 60-79% bilateral stenosis. Followup was recommended in 12 months but I do not see that this has been done yet.  He was admitted to [facility] 4/24-4/28 with N/V and hematemesis and new onset AFib with RVR.  He was seen by cardiology.  Coumadin was deferred due to hematemesis.  He was not seen by GI.  He did have a barium swallow due to dysphagia which was normal.  His N/V was felt to be due to a gastroenteritis.  He was also noted to have a UTI which was treated with antibx's.  He did develop an elevated creatinine from dehydration and his ACE was held.  His creatinine improved with this as well as hydration.  He was placed on diltiazem and did convert back to NSR.  He returns for follow up today.    He continues to feel sluggish and describes some dyspnea.  He is able to climb steps or go up hills without significant dyspnea.  He describes NYHA class 2 symptoms.  No chest pain.  He had some palpitations when he first went home from the hospital.  However, he has not had any further palpitations.  No orthopnea, PND, edema.  No syncope.  He denies dysphagia or odynophagia.  He has not had any further vomiting.  He does note a prior a h/o nausea and vomiting prior to his admission recently.  He had an episode of nausea since discharge as well.  He has lost some weight but he is not sure how much.  His appetite is good.    Past Medical History  Diagnosis Date  . Inguinal hernia without mention of obstruction or gangrene, unilateral or unspecified, (not specified as recurrent)   .  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 Peripheral vascular disease, unspecified   . Carotid stenosis     dopplers 1/11: 60-79% bilat.  . Hyperlipidemia     mixed  . Hypertension     a. echo 4/12: EF 50%, asymmetric septal hypertrophy, no SAM or LVOT gradient, LAE, PASP 35  . Coronary artery disease     a. s/p aborted ant STEMI tx with Cypher DES to LAD 10/04 (residual at cath: D1 50%, CFX 40% and multiple dist 70%, EF 55%);   b. myoview 3/10: Ef 47%, infero-apical isch, LOW RISK - med Tx recommended  . Glucose intolerance (impaired glucose tolerance)   . CKD (chronic kidney disease)   . Paroxysmal atrial fibrillation 12/2010    Current Outpatient Prescriptions  Medication Sig Dispense Refill  . aspirin 81 MG EC tablet Take 81 mg by mouth daily.        Marland Kitchen diltiazem (CARDIZEM CD) 180 MG 24 hr capsule Take 1 capsule (180 mg total) by mouth daily.  30 capsule  6  . hydrALAZINE (APRESOLINE) 50 MG tablet Take 50 mg by mouth 3 (three) times daily.        Marland Kitchen DISCONTD: diltiazem (CARDIZEM CD) 240 MG 24 hr capsule Take 240 mg by mouth daily.        Marland Kitchen  famotidine (PEPCID) 20 MG tablet Take 1 tablet (20 mg total) by mouth 2 (two) times daily.  60 tablet  11  . simvastatin (ZOCOR) 40 MG tablet Take 1 tablet (40 mg total) by mouth every evening.  30 tablet  11  . DISCONTD: lisinopril (PRINIVIL,ZESTRIL) 40 MG tablet Take 40 mg by mouth daily.        Marland Kitchen DISCONTD: niacin (NIASPAN) 1000 MG CR tablet Take 1,000 mg by mouth at bedtime.        Marland Kitchen DISCONTD: simvastatin (ZOCOR) 40 MG tablet Take 40 mg by mouth at bedtime.          No Known Allergies  History  Substance Use Topics  . Smoking status: Former Smoker -- 1.0 packs/day    Types: Cigarettes    Quit date: 09/08/2010  . Smokeless tobacco: Not on file  . Alcohol Use: No    ROS:  See HPI.  No melena or hematochezia.  No fevers, chills or cough.  All other systems reviewed and negative.  Vital Signs: BP 158/80   Pulse 50  Resp 18  Ht 6\' 3"  (1.905 m)  Wt 175 lb 12.8 oz (79.742 kg)  BMI 21.97 kg/m2  PHYSICAL EXAM: Well nourished, well developed, in no acute distress HEENT: normal Neck: no JVD Lymphatics: no cervical or supraclavicular adenopathy Cardiac:  normal S1, S2; RRR; 2/6 systolic murmur along LSB Lungs:  clear to auscultation bilaterally, no wheezing, rhonchi or rales Abd: soft, nontender, no hepatomegaly, no bruits Ext: no edema Skin: warm and dry Neuro:  CNs 2-12 intact, no focal abnormalities noted Psych: normal affect  EKG:  Sinus bradycardia, heart rate 50, normal axis, LVH, poor R wave progression, nonspecific ST-T wave changes, no significant change when compared to prior tracing, QTC 474 ms.  ASSESSMENT AND PLAN:

## 2011-01-13 NOTE — Telephone Encounter (Signed)
Med order.

## 2011-01-13 NOTE — Assessment & Plan Note (Signed)
He is overdue for carotid Dopplers.  This will be arranged.

## 2011-01-13 NOTE — Assessment & Plan Note (Signed)
I suspect his dyspnea may be related to his bradycardia.  I will decrease his diltiazem as noted.  His BNP was elevated in the hospital.  I do not see that a chest x-ray was done.  He does not look particularly volume overloaded to me.  He does have a mildly depressed LV function.  I will check a BNP with his basic metabolic panel today and also send him for a chest x-ray.

## 2011-01-18 ENCOUNTER — Other Ambulatory Visit: Payer: Self-pay | Admitting: Cardiology

## 2011-01-18 DIAGNOSIS — I6529 Occlusion and stenosis of unspecified carotid artery: Secondary | ICD-10-CM
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
## 2011-01-18 NOTE — Assessment & Plan Note (Signed)
[facility]                            CARDIOLOGY OFFICE NOTE   Logan, White                    MRN:          [id number]  DATE:10/03/2008                            DOB:          [birth year]    Logan White is a very pleasant gentleman who has a history of PCI of his  LAD in October 2004.  His last Myoview was performed on April 06, 2005,  and showed small inferoapical infarct, but no ischemia and his ejection  fraction was 50%.  He also has cerebrovascular disease.  Since I last  saw him, he is doing well from a symptomatic standpoint.  He has dyspnea  with more extreme activities, but not with routine activities.  This is  unchanged compared to July 05, 2007, when I saw him last.  It is not  associated with chest pain.  There is no orthopnea, PND, or pedal edema.  It resolves promptly with the rest.  He also has not had palpitations or  syncope.   MEDICATIONS:  1. Aspirin.  2. HCTZ 12.5 mg p.o. daily.  3. Zocor 40 mg p.o. daily.  4. Lisinopril 40 mg p.o. daily.   Note, he has not taken these in the last couple of days as he had ran  out of these medications.   PHYSICAL EXAMINATION:  VITAL SIGNS:  Blood pressure 160/89 and his pulse  is 56.  HEENT:  Normal.  NECK:  Supple.  CHEST:  Clear.  CARDIOVASCULAR:  Regular rhythm.  ABDOMEN:  No tenderness.  EXTREMITIES:  No edema.   His electrocardiogram today shows a sinus bradycardia at a rate of 55.  There is left ventricle hypertrophy with repolarization abnormality.   DIAGNOSES:  1. Coronary artery disease - the patient does have some dyspnea.  It      has been 4 years since his last Myoview.  We will plan to proceed      with a followup study.  If shows no ischemia, we will continue with      medical therapy.  He will continue on his aspirin, and I will      resume his angiotensin-converting enzyme inhibitor and statin.  2. Hypertension - his blood pressure is elevated today, but  we are      resuming his lisinopril and hydrochlorothiazide.  We will check a      BMET in 6 weeks.  3. Hyperlipidemia - we will resume his Zocor and we will check lipids      and liver in 6 weeks as well.  4. Cerebrovascular disease - he will continue his aspirin and statin.      We will schedule him to have followup carotid Dopplers.  5. History of peripheral vascular use.  6. Tobacco abuse - we discussed the importance of discontinuing this      for between 3-10 minutes.   We will see him back in 1 year.     Logan Bors [name], MD, [facility]  Electronically Signed    BSC/MedQ  DD: 10/03/2008  DT: 10/04/2008  Job #: [id number]
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
## 2011-01-18 NOTE — Op Note (Signed)
NAME:  Logan White, Logan White NO.:  [id number]   MEDICAL RECORD NO.:  [id number]          PATIENT TYPE:  INP   LOCATION:  5018                         FACILITY:  [place]   PHYSICIAN:  Astrid Divine. Marcelino Scot, M.D. DATE OF BIRTH:  [birth year]   DATE OF PROCEDURE:  01/11/2007  DATE OF DISCHARGE:                               OPERATIVE REPORT   PREOPERATIVE DIAGNOSIS:  1. Open left grade 3C proximal tibial shaft fracture.  2. Unicondylar lateral tibial plateau fracture.  3. Retained external fixateur .   POSTOPERATIVE DIAGNOSES:  1. Open left grade 3C proximal tibial shaft fracture.  2. Unicondylar lateral tibial plateau fracture.  3. Retained external fixateur .   PROCEDURES:  1. Open reduction and internal fixation of the tibia shaft.  2. Open treatment of unicondylar plateau fracture.  3. Irrigation and debridement of open fracture including bone.  4. Removal of external fixateur under anesthesia.   SURGEON:  Astrid Divine. Marcelino Scot, M.D.   ASSISTANT:  None.   ANESTHESIA:  General.   COMPLICATIONS:  None.   SPECIMENS:  None.   ESTIMATED BLOOD LOSS:  200 mL.   DISPOSITION:  To PACU.   CONDITION:  Stable.   BRIEF SUMMARY AND INDICATION FOR PROCEDURE:  Logan White is a 70-  year-old male with a vascular compromise of following an open fracture  of his tibial shaft.  This was treated with external fixation by Dr.  Basil Dess, as well as acute vascular repair by Dr. Donnetta Hutching.  CT scan  demonstrated a unicondylar plateau fracture as well involving the medial  plateau.  Given the complexity of the fracture, Dr. Louanne Skye asked for a  consultation and further management.  I discussed with the patient the  risks and benefits of surgery including the possibility of infection,  nerve injury, vessel injury, malunion, nonunion, and need for further  surgery among others.  He also understood the recommendation for  allografting and the risk associated with that as well.  After full  discussion he wished to proceed.   DESCRIPTION OF PROCEDURE:  Logan White was taken to the operating room  where general anesthesia was induced.  His left lower extremity was  prepped and draped in the usual sterile fashion.  The fixateur was  retained during prepping and during the procedure, given the vessel  repair.  The old medial incision was reopened and incarceration of the  hamstring tendons within the fracture site identified.  The fracture  site was distracted and then aggressive curettage including removal of  cancellus bone performed to reduce the risk of infection.  Soft tissues  of the skin and  subcutaneous tissues were debrided with curettes and  sharply with the knife.  Following this debridement, 3 liters of  pulsatile lavage was used as well.  We then loosened the fixateur and  were able to mobilize the fracture in order to maintain extrication of  the tendons while reducing and provisionally holding the fracture with K-  wires.  The fixateur  was, once again, retightened; and then two lag  screws were used to reduce the tibial tubercle from anterior-to-  posterior as well as medial-to-lateral.  We then checked to make sure  that our reduction was being held and compressed at the plateau.  We  took the 4.5 medial plate and slid this down the medial cortex distally;  and underneath the tendon proximally.  We used standard fixation in the  proximal segment in order to maximally appose the plate to the bone and  similarly, one standard screw distally.  We then were able to remove the  fixateur and further compress the plate to the proximal segment.  At  that point we then continued with locked fixation both in the shaft and  in the proximal segment; and exchanged the subchondral screws for locked  fixation.   As the external fixateur was medially in the subchondral bone, our plate  was just slightly lower than this.  We brought him into full extension,  and checked his  alignment which appeared excellent.  Following fixation  there was an excellent stability to varus-valgus forces in full  extension; and also a good stability at 30 degrees of flexion.  The  wounds were copiously irrigated; and then closed in a standard layered  fashion.  As the external fixateur  had been on for 4 days and under  sterile dressings.  With the only dressing change having been performed  by myself, I did feel that secondary, very loose closure, was indicated;  particularly as the proximal pins were most likely within the synovial  reflection.  These were aggressively irrigated and curettaged as well  prior to that closure.   We then took the large infused allograft membrane and cut three strips,  placing some along the anteromedial cortex; and over the tubercle  anteriorly; and then placing some laterally, and then, the bulk  posteriorly.  This resulted in good coverage; and the allograft itself  appeared to be in stable position.  The patient was then placed into  sterile dressings after a standard layered closure; and taken to PACU in  stable condition.   PROGNOSIS:  Logan White should do well following repair of his fracture.  We were able to obtain what appeared to be a very clean wound and  fracture site.  Key in what appeared a near anatomic reduction; and  restore his alignment while maintaining his articular congruity.  We did  achieve compression across that plateau as well.  The patient will have  unrestricted range of motion; and will be on Coumadin for DVT  prophylaxis bridged on Lovenox in the short-term.  He will maintain  nonweightbearing for the next 8 weeks with graduated weightbearing  thereafter.      Astrid Divine. Marcelino Scot, M.D.     MHH/MEDQ  D:  01/11/2007  T:  01/11/2007  Job:  [id number]
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
## 2011-01-21 ENCOUNTER — Encounter (INDEPENDENT_AMBULATORY_CARE_PROVIDER_SITE_OTHER): Payer: Medicare Other

## 2011-01-21 ENCOUNTER — Other Ambulatory Visit (INDEPENDENT_AMBULATORY_CARE_PROVIDER_SITE_OTHER): Payer: Medicare Other | Admitting: *Deleted

## 2011-01-21 ENCOUNTER — Other Ambulatory Visit: Payer: Self-pay | Admitting: *Deleted

## 2011-01-21 ENCOUNTER — Encounter (INDEPENDENT_AMBULATORY_CARE_PROVIDER_SITE_OTHER): Payer: Medicare Other | Admitting: *Deleted

## 2011-01-21 ENCOUNTER — Other Ambulatory Visit: Payer: Self-pay | Admitting: Physician Assistant

## 2011-01-21 DIAGNOSIS — I6529 Occlusion and stenosis of unspecified carotid artery: Secondary | ICD-10-CM

## 2011-01-21 DIAGNOSIS — I679 Cerebrovascular disease, unspecified: Secondary | ICD-10-CM

## 2011-01-21 DIAGNOSIS — I4891 Unspecified atrial fibrillation: Secondary | ICD-10-CM

## 2011-01-21 DIAGNOSIS — I10 Essential (primary) hypertension: Secondary | ICD-10-CM

## 2011-01-21 LAB — BASIC METABOLIC PANEL
CO2: 29 mEq/L (ref 19–32)
Calcium: 9.4 mg/dL (ref 8.4–10.5)
Creatinine, Ser: 2.1 mg/dL — ABNORMAL HIGH (ref 0.4–1.5)
GFR: 40.62 mL/min — ABNORMAL LOW (ref >60.00)
Sodium: 140 mEq/L (ref 135–145)

## 2011-01-21 NOTE — Assessment & Plan Note (Signed)
[facility]                            CARDIOLOGY OFFICE NOTE   Logan White, Logan White                    MRN:          [id number]  DATE:08/03/2006                            DOB:          May 08, 1941    Logan White is a gentleman who has a history of coronary disease,  cerebrovascular disease, hypertension and hyperlipidemia.  Since I last  saw him he is doing well.  There is no chest pain, dyspnea, palpitations  or syncope.  Note, he ran out of his medications recently due to  financial issues.  He is therefore not taking any of his medicines other  than aspirin.   PHYSICAL EXAM TODAY:  Shows blood pressure of 185/90 and his pulse is  61.  NECK:  Supple.  CHEST:  Clear.  CARDIOVASCULAR:  Reveals a regular rate and rhythm.  EXTREMITIES:  Show no edema.   His electrocardiogram shows a sinus rhythm at a rate of 61.  There is  left ventricular hypertrophy with repolarization abnormalities.   DIAGNOSES:  1. Coronary artery disease with history of drug-eluting stent to the      left anterior descending in 2004.  2. Hypertension.  3. Hyperlipidemia.  4. Tobacco abuse.  5. Cerebrovascular disease.  6. History of peripheral vascular disease.   PLAN:  Logan White is doing well from a symptomatic standpoint.  His  blood pressure is extremely elevated today but he has run out of all of  his medications.  We reviewed his medication list today and have made  multiple changes due to financial issues.  I have discontinued his  Plavix.  I would prefer to have him on this long term but he cannot  afford this medicine and his drug-eluting stent was placed 3 years ago.  He is also not taking it at this point regardless.  We will continue  with his aspirin.  He will continue off of his Norvasc.  We have also  discontinued his enalapril and Lipitor.  I will add lisinopril/HCT  20/12.5 mg p.o. daily and metoprolol ER 25 mg p.o. daily.  He will need  a BMET in 1  week.  I will add Zocor 40 mg p.o. q.h.s. and we will check  lipids in liver in 6 weeks.  Hopefully he will be able to afford these  medications.  He needs to discontinue his tobacco use.  He will need  follow up carotid Dopplers in the future.  Note a previous ultrasound  showed no significant aneurysm.  I will see him back in approximately 3  months.     Denice Bors [name], MD, [name]  Electronically Signed   BSC/MedQ  DD: 08/03/2006  DT: 08/03/2006  Job #: [id number]

## 2011-01-21 NOTE — Cardiovascular Report (Signed)
NAME:  NAGI, LISONBEE                       ACCOUNT NO.:  [id number]   MEDICAL RECORD NO.:  [id number]                   PATIENT TYPE:  OIB   LOCATION:  6522                                 FACILITY:  [place]   PHYSICIAN:  Eustace Quail, M.D.                  DATE OF BIRTH:  February 27, 1941   DATE OF PROCEDURE:  06/30/2003  DATE OF DISCHARGE:                              CARDIAC CATHETERIZATION   PROCEDURES PERFORMED:  Cardiac catheterization.   CARDIOLOGIST:  Eustace Quail, M.D.   CLINICAL HISTORY:  Logan White is 70 years old and has a history of a heart  attack about 10 years ago.  He was admitted to the hospital with chest pain  this weekend.  He had transient ST elevation in the anterior leads, but this  resolved, so he was not taken to the cath lab urgently.  He was scheduled  for evaluation with angiography today.  His CKs and MBs were positive for an  aborted ST elevation myocardial infarction.   DESCRIPTION OF PROCEDURE:  The procedure was performed via the left femoral  artery.  We initially tried to pass the wire via the right femoral artery,  but were unable to pass the wire.  We used a 6 [place] sheath an 6 [place]  preformed coronary catheters.  A distal aortogram was performed to evaluate  the right iliac artery, which was found to be totally occluded.   After completion of the diagnostic study I made the decision to proceed with  intervention on the LAD.   The patient was on an Integrilin drip and had been given previous heparin  and we gave additional heparin of 1800 units to prolong the ACT to greater  than 200 seconds.  We used a Q4 6 [place] guiding catheter with side holes  and a PT-2 light support wire.  We crossed the lesion in the proximal LAD  with the wire without difficulty.  We direct-stented with a 3.5 x 18 mm  CYPHER stent deploying this with one inflation of 16 atmospheres for 35  seconds.  We then post dilated with a 4.0 x 25 mm Quantum Maverick and  performed two inflations up to 14 atmospheres for 30 seconds.  Repeat  diagnostic studies were then performed through the guiding catheter.   We initially were planning to close the left femoral artery with Angio-Seal,  but the patient developed a hematoma while we were exchanging sheaths and we  elected not to do this.  We stopped the Integrilin at the end of the  procedure because of the hematoma, but this we obtained control quickly and  the groin remained soft.   RESULTS:   ANGIOGRAPHIC DATA:  Left Main Coronary Artery:  The left main coronary  artery was free of significant disease.   Left Anterior Descending Artery:  The left anterior descending artery gave  rise to four diagonal branches and two septal  perforators.  There was a 85%  stenosis in the proximal LAD.  There was 50% narrowing near the ostium of  the first diagonal branch.   Circumflex Artery:  The circumflex artery gave rise to a large marginal  branch, a small marginal branch, a posterolateral and a posterior descending  branch.  There was 40% narrowing in the proximal circumflex artery.  There  was 70% narrowing in the posterolateral branch.  There were multiple 70%  stenoses in the distal circumflex artery and into the proximal portion of  the posterior descending branch.   Right Coronary Artery:  The right coronary artery is a nondominant vessel,  supplied only the right ventricular branch and was free of major  obstruction.   VENTRICULOGRAPHIC DATA:  Left Ventriculogram:  The left ventriculogram  performed in the RAO projection showed hypo- to akinesis of the tip of the  apex.  The left ventricle was quite vigorous.  The estimated ejection  fraction was 55%.   AORTOGRAPHIC DATA:  Dital Aortogram:  A distal aortogram was performed,  which showed occlusion of the right external iliac artery just after its  takeoff from the internal iliac.  The left iliac artery was patent.  The  injection was too low to  visualize the renals.   HEMODYNAMIC DATA:  The aortic pressure was 143/73 with a mean of 99.  The  left ventricular pressure was 143/17.   Following stenting of the lesion in the proximal LAD the stenosis improved  from 95% to 10%.  There was a little bit of prolapse of tissue through the  stent.   CONCLUSIONS:  1. Recent aborted ST elevation anterior wall myocardial infarction with 95%     stenosis in the proximal left anterior descending, 50% ostial stenosis in     the first diagonal branch, 40% stenosis in the mid circumflex artery with     multiple 79% distal stenoses in the circumflex artery, no significant     obstruction in a nondominant right coronary artery, and apical wall hypo-     to akinesis with an estimated ejection fraction of 55%.  2. Successful stenting of the lesion in the proximal left anterior     descending using a CYPHER stent with improvement in percent narrowing     from 95% to 10%.   DISPOSITION:  The patient returned to the holding area for further  observation.                                                 Eustace Quail, M.D.    BB/MEDQ  D:  06/30/2003  T:  06/30/2003  Job:  [id number]   cc:   Ogden Wall, M.D.   Cardiopulmonary Laboratory

## 2011-01-21 NOTE — Consult Note (Signed)
[place]  Patient:    Logan White, Logan White Visit Number: [id number] MRN: [id number]          Service Type: MED Location: 2000 2038 01 Attending Physician:  Linna Caprice Dictated by:   Denice Bors. [name], M.D. [facility] Proc. Date: 01/11/02 Admit Date:  01/10/2002                            Consultation Report  HISTORY OF PRESENT ILLNESS:  Mr. Bronkema is a 70 year old gentleman with past medical history of myocardial infarction, hypertension, and inguinal hernia. We were asked to evaluate for atypical chest pain and bradycardia.  The patient states that approximately eight years ago he was diagnosed with a myocardial infarction here at [facility].  He states he did have a cardiac catheterization, although I do not have those records available.  He apparently had a minor blockage and was told he needed to take an aspirin a day and be treated medically.  He has not been taking those medications.  Over the past one week, the patient complains of left inguinal pain.  He was seen at [facility] and sent to the emergency room where an incarcerated inguinal hernia was reduced.  Over the past two days, he has also had nausea and vomiting.  Yesterday evening, he complained of an epigastric pain that was described as a burning sensation.  He felt that it was indigestion.  The pain did not radiate nor was there associated shortness of breath or diaphoresis. There were no factors that increased the pain or improved the pain.  Of note, he does have exertional pain and he denies any dyspnea on exertion, orthopnea, PND, pedal edema, palpitations, presyncope, or syncope.  He was also noted to have a heart rate in the 40s but again denies any syncope.  PAST MEDICAL HISTORY:  Significant for hypertension but he does not take medications.  There is no hyperlipidemia or diabetes mellitus.  He has had a prior history of inguinal hernia repair.  PAST  SURGICAL HISTORY:  There are no other prior surgeries.  FAMILY HISTORY:  Positive for coronary artery disease.  SOCIAL HISTORY:  He smokes cigars but denies any alcohol use.  He does smoke marijuana.  MEDICATIONS:  His present medications at the time of admission were none.  ALLERGIES:  He has no known drug allergies.  REVIEW OF SYSTEMS:  He denies any headaches, fevers, or chills.  There is no productive cough or hemoptysis.  There is no dysphagia, odynophagia, melena, hematochezia.  He has not had nausea and vomiting over the past two days. There is no orthopnea, PND, or pedal edema.  There is no claudication noted. The remainder of the review of systems are negative.  PHYSICAL EXAMINATION:  VITAL SIGNS:  His physical exam today shows a blood pressure of 190 to 200/100 to 110.  His pulse has been in the 40s since admission.  He is afebrile.  His respiratory rate is 20.  GENERAL:  He is well-developed, well-nourished and no acute distress.  SKIN:  Warm and dry.  HEENT:  Unremarkable with normal eye lids.  NECK:  Supple with normal upstrokes bilaterally and there are no bruits noted. There was no jugular venous distension.  No thyromegaly noted.  CHEST:  Clear to auscultation with normal expansion.  CARDIOVASCULAR:  Bradycardic rate but a regular rhythm.  There is a gallop but there are no murmurs  noted.  ABDOMEN:  Nontender.  Standard positive bowel sounds.  No hepatosplenomegaly and no masses appreciated.  There is no abdominal bruit.  He has 2+ femoral pulses bilaterally.  He does have a left femoral bruit.  EXTREMITIES:  No edema.  I can palpate no cords.  He has 2+ dorsalis pedis pulses bilaterally.  NEUROLOGICAL:  Grossly intact.  LABORATORY DATA:  An echocardiogram from today shows normal left ventricular function with mild LVH.  There is mild LAE and trivial mitral regurgitation. His electrocardiogram from [place] reveals a marked sinus bradycardia at  a rate of 44.  The axis is normal.  There is left ventricular hypertrophy with repolarization abnormalities.  The anterolateral T-wave changes noted. Ischemia cannot be excluded.  There is no prior electrocardiogram for comparison.  A follow-up electrocardiogram today again shows a marked bradycardia at a rate of 41.  There is some improvement in the T-wave changes. His chest x-ray showed mild vascular congestion and atelectasis.  His hemoglobin is 16 with a hematocrit of 46.  His white blood cell count is 8.5 with a platelet count of 222,000.  His sodium is 146 with a potassium of 2.8.  His BUN and creatinine are 8 and 0.9.  His glucose is 119.  His alkaline phosphatase is mildly elevated at 134.  DIAGNOSES: 1. Atypical epigastric pain. 2. Asymptomatic sinus bradycardia. 3. Hypertension, untreated. 4. Status post reduction of left inguinal hernia.  PLAN:  Mr. Pritchett presents for evaluation of epigastric pain as well as asymptomatic sinus bradycardia.  His symptoms are very atypical and occur in the setting of an incarcerated hernia associated with nausea and vomiting. However, he does have risk factors for coronary disease including untreated hypertension, tobacco use, and male sex.  He did rule out for myocardial infarction with serial enzymes (his CK-MB is elevated mildly but there is no clear trend and his troponin I is negative).  We will therefore plan to proceed with a stress Cardiolite tomorrow morning.  This will be to rule out ischemia and also to evaluate his chronotropic competence.  If it shows normal perfusion and his heart rate does increase, then I would not recommend further cardiac workup.  He does need risk factor modification including discontinuation of tobacco use.  His blood pressure also remains markedly elevated and his Lotensin can be increased as needed.  Given his bradycardia, we would avoid AV nodal block agents including verapamil, Cardizem, digoxin, and  beta blockade.  I would also avoid clonidine as it can also decrease her heart rate.  We will follow him while he is in the hospital.  Dictated by:   Denice Bors. [name], M.D. [place] Attending Physician:  Linna Caprice DD:  01/11/02 TD:  01/13/02 Job: [id number]

## 2011-01-21 NOTE — H&P (Signed)
NAME:  Logan White, Logan White NO.:  [id number]   MEDICAL RECORD NO.:  [id number]                   PATIENT TYPE:  INP   LOCATION:  [id number]                                 FACILITY:  [facility]   PHYSICIAN:  Satira Sark, M.D. [facility]        DATE OF BIRTH:  1940/09/19   DATE OF ADMISSION:  06/28/2003  DATE OF DISCHARGE:                                HISTORY & PHYSICAL   CHIEF COMPLAINT:  Abdominal pain.   PRIMARY CARE PHYSICIAN:  [facility].   HISTORY OF PRESENT ILLNESS:  The patient is a 70 year old man from  [place] who presents with chest pain/abdominal pain to [facility]  Emergency Department. He was moving a bed at approximately 3:30-4:00 p.m.  when he experienced a sharp stabbing pain across his abdomen that was 8-9/10  in intensity. He decided to go to the emergency department and en route, he  experienced heartburn, but no chest pressure. He also had nausea, vomiting,  and diaphoresis. After arrival in the emergency department, the patient's  heartburn sensation decreased to 1/10 after he received aspirin,  nitroglycerin, and a GI cocktail.  On my arrival to the emergency  department, the patient was complaining of 1/10 heartburn and he was  comfortable.   PAST MEDICAL HISTORY:  1. Hernia.  2. Hypertension.  3. The patient is unsure whether or not he had a small MI ten years ago. An     exercise treadmill test done one year ago was reportedly normal.   ALLERGIES:  No known drug allergies.   MEDICATIONS:  The patient has been off his blood pressure medications for  some time. He is not sure what he previously took.   SOCIAL HISTORY:  The patient lives in [place] with his wife. He has a 42-  year-old child. 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  He has a 10-pack-  year history of smoking and is currently smoking 2-3 cigars per day.  He  intermittently uses marijuana and the last time he used marijuana was the  night prior to admission.   FAMILY HISTORY:  His father died of coronary artery disease. His uncle died  of coronary artery disease.   REVIEW OF SYSTEMS:  All other review of systems are negative, except as  mentioned in the HPI. CARDIOPULMONARY:  Positive for chest pain.  GASTROINTESTINAL:  Positive for nausea, vomiting, GERD symptoms, and  abdominal pain. All other review of systems are negative.   PHYSICAL EXAMINATION:  VITAL SIGNS:  On admission, pulse 56, respirations  20, blood pressure initial readings were 180/100 but, at the time I saw him,  blood pressure was 158/91, saturating 97% on 2 L.  GENERAL:  He is a  pleasant man in no apparent distress with a thin body habitus.  HEENT:  Normocephalic and atraumatic.  PERRLA. EOMI.  NECK:  No JVD, no bruits.  No  thyromegaly, no lymphadenopathy.  CARDIAC:  Regular rate and rhythm  with no  murmur, rub, or gallop.  Normal PMI. The patient has a thin chest.  LUNGS:  Clear to auscultation bilaterally.  No rales, rhonchi, or wheezes.  ABDOMEN:  Soft, nontender, and nondistended.  No hepatosplenomegaly.  Normal bowel  sounds.  EXTREMITIES:  No clubbing, cyanosis, or edema. MUSCULOSKELETAL:  Within normal limits.  NEUROLOGICAL:  Intact.   LABORATORY DATA:  Chest x-ray is pending.   EKG, on initial arrival to the ER, shows bradycardia with several  millimeters of anterior ST elevation. This resolved quickly and the  patient's EKG is currently at sinus bradycardia with a rate of 50-60 with  inferolateral T-wave inversion.   White count 8.4, hematocrit 46, hemoglobin 16, platelet count 213,000.  Sodium 143, potassium 3.4, chloride 109, bicarbonate 29, BUN 19, creatinine  1.4, glucose 128.  Bilirubin 0.6, AST 23, ALT 18. CK 350, MB 7, Troponin  0.16. Alkaline phosphatase 108, amylase 97, lipase 19, total protein 7.1,  albumin 2.7. Urinalysis positive for small blood but, otherwise, negative.   ASSESSMENT/PLAN:  The patient is a 70 year old man with history of a   questionable small myocardial infarction ten years ago, positive tobacco  abuse, uncontrolled hypertension, and positive family history who presents  with atypical symptoms, but ST elevation quickly resolved with treatment in  the emergency department.  The patient will be admitted for an acute  myocardial infarction.   1. Acute myocardial infarction .The patient is currently chest pain-free and     his electrocardiogram does not any longer show ST elevation. Will slowly     titrate up his nitroglycerin and provide morphine as needed. He does have     positive enzymes, so we will begin a IIB-IIIA inhibitor, heparin, and     aspirin. I will also start an ACE inhibitor and a statin.  We will not be     able to start a beta- blocker because of his bradycardia.  2. Hypertension.  We will follow his hypertension closely. His blood     pressure is slightly better controlled now. We will continue his     nitroglycerin and begin captopril.  3. Diabetes.  We  will follow his blood sugars closely.   We will obtain a cardiac catheterization in the next 1-2 days at [facility].  I will follow the patient closely in the interim time.      Era Bumpers, M.D. [facility]                Satira Sark, M.D. [facility]    DWM/MEDQ  D:  06/28/2003  T:  06/28/2003  Job:  [id number]

## 2011-01-21 NOTE — Consult Note (Signed)
[place]. [facility]  Patient:    Logan White, Logan White Visit Number: [id number] MRN: [id number]          Service Type: MED Location: 2000 2038 01 Attending Physician:  Linna Caprice Dictated by:   Judeth Horn, M.D. Proc. Date: 01/10/02 Admit Date:  01/10/2002                            Consultation Report  REASON FOR CONSULTATION:  This 70 year old gentleman whom I was asked to see by Dr. Silvano Bilis. [place], while in the emergency room seeing other patients, to see this gentleman who had an incarcerated hernia plus nausea and vomiting.  I went to evaluate the patient where he had an incarcerated left inguinal hernia which had been that way for at least the past two hours.  He had been known to have a hernia, but it had never been incarcerated.  PHYSICAL EXAMINATION:  He had a large left inguinal hernia, which was not initially reducible, because of discomfort.  He had active bowel sounds in the scrotum.  After the patient received 1 mg of Dilaudid, 12.5 mg of IV Phenergan, and 2 mg of IV Versed, we were able to easily reduce the hernia.  Bowel activity remained.  The patient remained sleepy; however, the nausea did resolve.  The patient could have an elective cholecystectomy as an outpatient, and I have given him my card.  Further evaluation also shows that the patient had sinus bradycardia, which may need a separate workup.  He is currently on no medication.  PAST MEDICAL HISTORY  He has had a previous right inguinal hernia repair also. His history is somewhat vague because of his discomfort and now because of his sedation.  RECOMMENDATION:  Will allow the patient to awaken and go home, now that the hernia is reduced. Dictated by:   Judeth Horn, M.D. Attending Physician:  Linna Caprice DD:  01/10/02 TD:  01/12/02 Job: [id number]

## 2011-01-21 NOTE — Discharge Summary (Signed)
NAME:  Logan White, Logan White                       ACCOUNT NO.:  [id number]   MEDICAL RECORD NO.:  [id number]                   PATIENT TYPE:  OIB   LOCATION:  [id number]                                 FACILITY:  [facility]   PHYSICIAN:  Eustace Quail, M.D.                  DATE OF BIRTH:  Feb 20, 1941   DATE OF ADMISSION:  06/30/2003  DATE OF DISCHARGE:  07/01/2003                           DISCHARGE SUMMARY - REFERRING   PROCEDURE:  1. Cardiac catheterization.  2. Coronary arteriogram.  3. Left ventriculogram.  4. Ultrasound of right groin.  5. Percutaneous transluminal coronary angioplasty and stent to one vessel.   HOSPITAL COURSE:  [name] is a 71 year old male with no known history of  coronary artery disease. At about 3:30 to 4:00 on the day of admission, he  was moving a bed when he experienced sharp stabbing pain across his abdomen,  and he went to the emergency room, and on route to the emergency room, he  had heartburn. His pain decreased to a 1/10 in the emergency room, and he  received aspirin, nitroglycerin, and a GI cocktail. He was still complaining  of 1/10 heartburn upon arrival to the emergency room; however, his initial  enzymes were abnormal with a CK-MB of 350/7.0 and a troponin of 0.16. It was  therefore decided to admit him to the hospital and schedule him for cardiac  catheterization.   [name] ruled in for a MI with a CK-MB that peaked to 313/13.1. His  troponin I peaked at 1.18. He had a cardiac catheterization performed on  June 30, 2003, and it showed a normal left main and RCA, but the first  diagonal had a 50% stenosis, the circumflex had multiple 70% lesions, and  the LAD had a 95% mid stenosis. The 95% mid stenosis in the LAD was treated  with PTCA and a Cypher stent, reducing it to 10% with TIMI-III flow. Dr.  Olevia Perches evaluated the patient and felt that he should have a Cardiolite in  four to six weeks to assess the circumflex artery or sooner on a  p.r.n.  basis. His EF was approximately 55% with apical hypokinesis. Of note, an  abdominal aortogram showed patent abdominal aorta with a right external  iliac artery just after its take off from the internal iliac was occluded.  The left iliac artery was patent. The renals were not visualized.   [name] tolerated the procedure well, but post procedure, his blood  pressure was significantly elevated, being generally greater than 180. He  had received labetalol 20 mg IV in the catheterization lab, but this dropped  his heart rate into the 50s, and so his low dose beta blocker was not  increased. He had IV hydralazine p.r.n. and Norvasc added to his medication  regimen. The next day, Vasotec was added as well.   The next day, [name] was noted to have bilateral groin bruits,  left  greater than right. The cardiac catheterization had been attempted on the  right but actually performed on the left, so a groin ultrasound was ordered  to make sure there was no pseudoaneurysm or AV fistula. The groin ultrasound  was negative for pseudoaneurysm or AV fistula.   [name] was continuing to smoke when he came into the hospital, and a  smoking cessation consult was called. He was encouraged to quit, and the  patient stated that he wanted to do it cold [place] and did not request any  medical help.   [name] was also seen by cardiac rehab, and the use of 911, MI risk  factors, cardiac risk factor modifications were discussed. He was referred  for outpatient cardiac rehab.   By July 01, 2003, [name]'s ultrasound was negative, and he was  ambulating without chest pain or shortness of breath. He was considered  stable for discharge on July 01, 2003 p.m. with outpatient followup  arranged.   [name] was hypokalemic upon admission with a potassium of 3.4. Because  of IV fluids, his potassium dropped to a low of 2.9, and he received  multiple supplemental doses including 80 mEq  on the day of discharge for a  potassium of 3.0. He is not on any diuretics, and this will be followed up  in the office. Additionally, it was noted that his CBG was 147, and it had  been 111 on admission that was nonfasting. It was felt that this is  secondary to glucose containing IV solutions. The patient is to followup  with his primary care physician for general medical care and with  cardiology.   LABORATORY DATA:  Sodium 137, potassium 3.0, chloride 98, CO2 35, BUN 6,  creatinine 1.1, glucose 147. Hemoglobin 14.4, hematocrit 42.4, WBC 8.3,  platelets 192. Total cholesterol 167, triglycerides 46, HDL 36, LDL 122. TSH  0.315. Urine drug screen positive for THC.   DISCHARGE CONDITION:  Improved.   DISCHARGE DIAGNOSES:  1. Acute anterior myocardial infarction with Cypher stent to the left     anterior descending artery this admission.  2. Residual coronary artery disease of the circumflex with an ejection     fraction of 55%, stress test in the office four to six weeks for     evaluation.  3. Hyperlipidemia.  4. Hypertension.  5. Ongoing tobacco use.  6. Family history of coronary artery disease.  7. Noncompliance with medications prior to admission.  8. Peripheral vascular disease.   DISCHARGE INSTRUCTIONS:  His activity level is to include no driving for two  days and no strenuous or sexual activity or work until cleared by M.D. He is  to stick to a low fat diet. He is to call the office with problems with the  catheterization site. He is not to use tobacco. He is follow up with [facility]. He is to follow up with Dr. [name] November 9 at 9:30 a.m.   DISCHARGE MEDICATIONS:  1. Aspirin 325 mg q.d.  2. Plavix 75 mg q.d.  3. Toprol-XL 25 mg q.d.  4. Nitroglycerin 0.4 mg p.r.n.  5.     Lipitor 80 mg one half tablet q.d.  6. Vasotec 5 mg q.d.  7. Norvasc 5 mg q.d.     Davis Gourd, P.A. [facility]                  Eustace Quail, M.D.    RG/MEDQ  D:  07/01/2003  T:  07/01/2003  Job:  [id number]   cc:   Kirk Ruths, M.D.   [place]

## 2011-01-21 NOTE — Discharge Summary (Signed)
Logan White. [facility]  Patient:    Logan White, Logan White Visit Number: [id number] MRN: [id number]          Service Type: MED Location: [id number] Attending Physician:  Linna Caprice Dictated by:   Cheryl Flash, M.D. Admit Date:  01/10/2002 Discharge Date: 01/14/2002   CC:         [facility]   Discharge Summary  DISCHARGE DIAGNOSES: 1. Atypical chest pain.    a. The patient presented with chest pain on October 18, 1994, and todays       chest pain the patient had a Cardiolite test done which showed an       ejection fraction of 41%, no ischemia, no scarring by imaging.  He also       had a two-dimensional echocardiogram done that showed normal left       ventricular size, ejection fraction was 55 to 65%.  The patient was       noted to have a cardiomyopathy secondary to hypertension and requires       hypertensive management, was not on any hypertensive medicines on       presentation. 2. History of bradycardia and was also bradycardic in February of 1996.    Bradycardia per cardiology is presumed to be secondary to vagal tone,    improved during his hospitalization, no further workup was done. 3. Hypertension.  The patient was started on Lotensin and hydrochlorothiazide.    If blood pressure is still elevated on follow-up will add Norvasc and    calcium channel blocker.  Will not add clonidine or beta blocker because of    history of bradycardia. 4. Increased LDL, the patient has cholesterol 179, HDL 48, LDl 120, will not    add an agent at this time, but on follow-up will probably add a statin to    get LDL less than 100. 5. Incarcerated inguinal hernia.  The patient has a history of incarcerated    hernia and hernia repair on the right. Today he has incarcerated hernia on    the left. Hernia was reduced in the emergency department by Dr. Hulen Skains. The    patient is to follow up as outpatient for elective hernia repair. 6. Hypokalemia. 7. Nausea  and vomiting. 8. Proteinuria. 9. Increased hemoglobin.  DISCHARGE MEDICATIONS: 1. Lotensin 40 mg p.o. q.d. 2. Hydrochlorothiazide 25 mg p.o. q.d. 3. Aspirin 81 mg p.o. q.d.  FOLLOW-UP:  The patient was told to call primary care doctor or to [place] to make appointment within a week.  Also to call Dr. Hulen Skains, surgeon, to make appointment for elective surgery of his inguinal hernia.  PROCEDURE:  He had the inguinal hernia repair on the 8th.  Also had a Cardiolite stress test done on the 10th.  CHIEF COMPLAINT:  Chest pain, bradycardia, incarcerated inguinal hernia.  HISTORY OF PRESENT ILLNESS:  [name] is a 70 year old African-American male with a past medical history significant for hypertension, tobacco abuse, with a history of small MI per patient back in 1996.  He presented to the ED from [place] secondary to inguinal pain x1 week.  The patient found to have an incarcerated inguinal hernia that was reduced by surgery at the emergency department.  The patient was found to be hypertensive and bradycardic with a burning substernal chest pain since that afternoon.  The patient contributes his chest pain to heartburn.  He also complained of vomiting that afternoon secondary to his hernia.  The patient denies  shortness of breath, dyspnea on exertion, PND, orthopnea, or lower extremity edema.  In the emergency department, the patient bradyd down to 31s and then 40s on arrival.  PAST MEDICAL HISTORY:  As per discharge problem list.  FAMILY HISTORY:  Mother died in 51 of MI.  Also had sickle cell.  Father died of MI in his 89s.  Also had hypertension.  Two brothers and two sisters that are healthy.  SOCIAL HISTORY:  Recently started Architect job.  No IV drug use.  He smokes four cigarettes per day x6 months, used to smoke one pack per day for 20 years.  He is married with four children, one deceased from breast cancer.  PHYSICAL EXAMINATION:  VITAL SIGNS: Temperature  96.8, heart rate 43, blood pressure 185/89, respiratory rate 18, pulse 97% on room air.  GENERAL: The patient was in no acute distress.  He was lethargic, status post getting Versed and Dilaudid for his hernia repair.  HEENT: Head is normocephalic and atraumatic.  Pupils are pinpoint.  Extraocular movements intact.  Sclerae muddy.  No JVD.  NECK: Supple.  LUNGS: Respirations with crackles in the bases, no wheezes.  Good air movement.  HEART: Bradycardic with no murmurs, rubs, or gallops appreciated.  GASTROINTESTINAL: Positive bowel sounds.  Soft and nontender with no guarding, no rebound, no hepatosplenomegaly.  No Murphys sign.  EXTREMITIES: No clubbing, cyanosis, or edema.  DP pulse 2+ bilaterally.  NEUROLOGICAL: Cranial nerves II-XII intact.  Alert and oriented x3.  HOSPITAL COURSE:  #1 - Chest pain.  The patients chest pain sounds atypical, but because he does have some cardiac risk factors in that he stated that he had a small MI in 1996.  He has uncontrolled hypertension.  He smokes tobacco.  He was bradycardic.  Cardiology was consulted.  A two-dimensional echocardiogram was done that was normal.  A Cardiolite stress test was done that showed EF of 41% but no ischemic changes.  Cardiology felt no further workup was warranted and the patient should have real good control of his hypertension.  The patient would follow up with his primary.  He was started on Lotensin and hydrochlorothiazide.  #2 - Bradycardia.  Per cardiology, bradycardia is most likely secondary to vagal response to pain from his inguinal hernia.  During his hospitalization, his heart rate was within normal limits.  No further workup was done by cardiology.  #3 - Incarcerated inguinal hernia, status post release in ED.  The patient will follow up with surgery for elective repair of his inguinal hernia.  #4 - Nausea and vomiting that was most likely secondary to his inguinal hernia repair.  He received  Phenergan and Zofran p.r.n. and that resolved.   #5 - Hypokalemia.   Probably most likely secondary to his vomiting.  He received runs of potassium and potassium was normal on discharge.  #6 - Hypertension.  He was started on an ACE inhibitor for blood pressure control secondary to his low heart rate and blood pressure was still elevated.  He was also started on hydrochlorothiazide.  On follow-up if blood pressure is still elevated, would probably add Norvasc.  Will not add clonidine or beta blocker because of bradycardia.  #7 - Proteinuria.  On discharge, spot urine was done to quantitate the amount of protein that was in the patients urine.  Most likely, the patient has proteinuria secondary to uncontrolled hypertension.  Probably has nephretic syndrome.  Will follow up on results of proteinuria.  #8 - Elevated hemoglobin.  The patient had elevated hemoglobin levels on his admission CBC.  Will follow up with his primary care diagnosis to workup his elevated hemoglobin level.  DISCHARGE LABORATORY DATA:  Sodium 136, potassium 3.9, chloride 100, CO2 29, glucose 93, BUN 76, creatinine 1.4, calcium 9.3.  CBC; WBC 9.2, hemoglobin 18.5, hematocrit 52.8, platelets 243, neutrophils 45%, lymphocytes 45%.  UA; large blood with greater than 300 protein.  Otherwise negative.  TSH 0.837, troponin 0.02.  Urine drug screen negative.  CK 537, MB 7.6, relative index 1.4, troponin 0.02.  Lipid profile; total cholesterol 179, HDL 48, LDL 120, lipase 18, troponin 0.07, troponin 0.03, CK 904, 6.6 MB, and relative index 0.7, and troponin 0.07.  CK 904, MB 86.6, relative index 0.7, and troponin 0.03.  Perfudin study; global hypokinesis especially in the septum, left ventricle is dilated with end diastolic volume of [id number] ml and EF of 41%, no indefinite scar or inducable ischemia is evident.  The abdominal x-ray showed nonobstructive bowel gas pattern.  Chest x-ray showed pulmonary vascular congestion, mild  basilar atelectasis.  EKG on admission showed marked bradycardia, LVH with some ST and T wave abnormality, representing anterolateral ischemia with a normal axis. Dictated by:   Cheryl Flash, M.D. Attending Physician:  Linna Caprice DD:  01/14/02 TD:  01/15/02 Job: [id number]

## 2011-01-24 ENCOUNTER — Encounter: Payer: Self-pay | Admitting: Cardiology

## 2011-01-28 ENCOUNTER — Other Ambulatory Visit: Payer: Medicare Other | Admitting: *Deleted

## 2011-02-01 ENCOUNTER — Encounter: Payer: Medicare Other | Admitting: Cardiology

## 2011-02-01 NOTE — Progress Notes (Signed)
HPI: Logan White is a 70 y.o. male with a history of PCI of his LAD in October 2004. His last Myoview was performed on November 12, 2008. Ejection fraction was 47%. There was a small area of potential ischemia at the infero-apex. It was felt to be low risk and we are treating medically. He also has cerebrovascular disease. Carotid Dopplers on Jan 21, 2011 showed 40-59% bilateral stenosis and followup recommended in one year.  He was admitted to [facility] 4/24-4/28 with N/V and hematemesis and new onset AFib with RVR. Coumadin was deferred due to hematemesis. He did have a barium swallow due to dysphagia which was normal. His N/V was felt to be due to a gastroenteritis. He was also noted to have a UTI which was treated with antibx's. He did develop an elevated creatinine from dehydration and his ACE was held. His creatinine improved with this as well as hydration. He was placed on diltiazem and did convert back to NSR. Echocardiogram in April of 2012 showed an ejection fraction of 50% with posterior hypokinesis. There was mild to moderate left atrial enlargement. Holter monitor in May 2012 showed sinus rhythm with PACs, PVCs and brief PAT. He was seen by Richardson Dopp in April of 2012 and started on Lasix for dyspnea and an elevated BNP. His BUN and creatinine increased and Lasix was decreased to 20 mg daily. Since he was last seen,    Current Outpatient Prescriptions  Medication Sig Dispense Refill  . aspirin 81 MG EC tablet Take 81 mg by mouth daily.        Marland Kitchen diltiazem (CARDIZEM CD) 180 MG 24 hr capsule Take 1 capsule (180 mg total) by mouth daily.  30 capsule  6  . famotidine (PEPCID) 20 MG tablet Take 1 tablet (20 mg total) by mouth 2 (two) times daily.  60 tablet  11  . furosemide (LASIX) 40 MG tablet Take 1 tablet (40 mg total) by mouth daily.  30 tablet  11  . hydrALAZINE (APRESOLINE) 50 MG tablet Take 50 mg by mouth 3 (three) times daily.        . potassium chloride (K-DUR) 10 MEQ tablet Take 1 tablet  (10 mEq total) by mouth daily.  30 tablet  11  . simvastatin (ZOCOR) 40 MG tablet Take 1 tablet (40 mg total) by mouth every evening.  30 tablet  11     Past Medical History  Diagnosis Date  . Inguinal hernia without mention of obstruction or gangrene, unilateral or unspecified, (not specified as recurrent)   . 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 Peripheral vascular disease, unspecified   . Carotid stenosis     dopplers 1/11: 60-79% bilat.  . Hyperlipidemia     mixed  . Hypertension     a. echo 4/12: EF 50%, asymmetric septal hypertrophy, no SAM or LVOT gradient, LAE, PASP 35  . Coronary artery disease     a. s/p aborted ant STEMI tx with Cypher DES to LAD 10/04 (residual at cath: D1 50%, CFX 40% and multiple dist 70%, EF 55%);   b. myoview 3/10: Ef 47%, infero-apical isch, LOW RISK - med Tx recommended  . Glucose intolerance (impaired glucose tolerance)   . CKD (chronic kidney disease)   . Paroxysmal atrial fibrillation 12/2010    Past Surgical History  Procedure Date  . Popliteal artery angioplasty 01/07/07    s/p LEFT POPLITEAL ARTERY EXPLORATION AND VEIN PATCH ANGIOPLASTY, PER DR. EARLY, SECONDARY TO ISCHEMIC LEFT FOOT  RELATED TO LEFT POPLITEAL ARTERY INJURY  . Orif tibia & fibula fractures 01/11/07    OPEN TX OF UNICONDYLAR PLATEAU FRACTURE, IRRIGATION/DEBRIDEMENT OF OPEN FRACTURE INCLUDING BONE, REMOVAL OF EXTERNAL FIXATOR UNDER ANESTHESIA PER DR. MICHAEL HANDY    History   Social History  . Marital Status: Married    Spouse Name: N/A    Number of Children: N/A  . Years of Education: N/A   Occupational History  . 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 Smoking status: Former Smoker -- 1.0 packs/day    Types: Cigarettes    Quit date: 09/08/2010  . Smokeless tobacco: Not on file  . Alcohol Use: No  . Drug Use: No  . Sexually Active: Not on file   Other Topics Concern  . 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 YES. 1/2 -1 PPD OF CIGARETTES NO ETOH    ROS: no fevers or chills, productive cough, hemoptysis, dysphasia, odynophagia, melena, hematochezia, dysuria, hematuria, rash, seizure activity, orthopnea, PND, pedal edema, claudication. Remaining systems are negative.  Physical Exam: Well-developed well-nourished in no acute distress.  Skin is warm and dry.  HEENT is normal.  Neck is supple. No thyromegaly.  Chest is clear to auscultation with normal expansion.  Cardiovascular exam is regular rate and rhythm.  Abdominal exam nontender or distended. No masses palpated. Extremities show no edema. neuro grossly intact  ECG     This encounter was created in error - please disregard.

## 2011-02-07 ENCOUNTER — Telehealth: Payer: Self-pay | Admitting: *Deleted

## 2011-02-07 ENCOUNTER — Encounter: Payer: Self-pay | Admitting: Gastroenterology

## 2011-02-07 NOTE — Telephone Encounter (Signed)
Attempted to inform pt monitor results  Sinus with pac's, brief paf, pvc's and couplet per dr crenshaw./cy

## 2011-02-08 ENCOUNTER — Telehealth: Payer: Self-pay

## 2011-02-08 NOTE — Telephone Encounter (Signed)
Pt was scheduled for a direct procedure in July but the pt was referred for hematemesis. Previsit nurse cancelled previsit appt and states that the pt needs and OV to evaluate the pt prior to the procedure. Appt made for the pt to see Tye Savoy NP 02/10/11@10am . Pt aware of appt date and time.

## 2011-02-10 ENCOUNTER — Encounter: Payer: Self-pay | Admitting: Nurse Practitioner

## 2011-02-10 ENCOUNTER — Ambulatory Visit (INDEPENDENT_AMBULATORY_CARE_PROVIDER_SITE_OTHER): Payer: Medicare Other | Admitting: Nurse Practitioner

## 2011-02-10 VITALS — BP 110/62 | HR 60 | Ht 75.5 in | Wt 184.2 lb

## 2011-02-10 DIAGNOSIS — I4891 Unspecified atrial fibrillation: Secondary | ICD-10-CM

## 2011-02-10 DIAGNOSIS — R718 Other abnormality of red blood cells: Secondary | ICD-10-CM

## 2011-02-10 DIAGNOSIS — R5383 Other fatigue: Secondary | ICD-10-CM

## 2011-02-10 DIAGNOSIS — R6889 Other general symptoms and signs: Secondary | ICD-10-CM

## 2011-02-10 DIAGNOSIS — Z7901 Long term (current) use of anticoagulants: Secondary | ICD-10-CM

## 2011-02-10 DIAGNOSIS — K920 Hematemesis: Secondary | ICD-10-CM

## 2011-02-10 DIAGNOSIS — Z1211 Encounter for screening for malignant neoplasm of colon: Secondary | ICD-10-CM | POA: Insufficient documentation

## 2011-02-10 NOTE — Assessment & Plan Note (Addendum)
Hospitalized late April with nausea vomiting and low volume hematemesis. Suspect hematemesis was secondary to Mallory-Weiss tear or esophagitis. Hemoglobin remained in the normal range during hospitalization. Patient is due for colon cancer screening. I believe it is wise to do an upper endoscopy simultaneously, especially since the patient may need Coumadin in the near future for atrial fibrillation. The benefits, risks, and potential complications of EGD with possible biopsies were discussed with the patient and he agrees to proceed.

## 2011-02-10 NOTE — Assessment & Plan Note (Signed)
Due for routine colon cancer screening. Will arrange for this to be done at the time of upper endoscopy. The risks, benefits, and alternatives to colonoscopy with possible biopsy and possible polypectomy were discussed with the patient and he agrees to proceed.

## 2011-02-10 NOTE — Progress Notes (Signed)
Reviewed and agree with management. Williemae Muriel T. Joannah Gitlin MD FACG 

## 2011-02-10 NOTE — Progress Notes (Signed)
02/10/2011 MALLORY JUSTEN [id number] 1941/07/20   HISTORY OF PRESENT ILLNESS: Mr. Weisner is a 70 year old black male with a history of PCI to his LAD in 2004. He has a history of cerebrovascular accident, hypertension and chronic kidney disease. Patient was last seen in 2001 by Dr. Deatra Ina for routine colon cancer screening. He is on the recall list for a colonoscopy for 02/23/2011 but was referred here by cardiology for evaluation of hematemesis. In late April patient was admitted with nausea, vomiting, hematemesis and abdominal pain. Symptoms were felt to be secondary to viral gastroenteritis as well as a urinary tract infection.  Patient was also diagnosed with new onset atrial fibrillation  with rapid ventricular response felt to be secondary to dehydration. The patient tells me he did in fact have a couple of episodes of hematemesis prior to hospital admission but that was only after he had vomited several times. No prior history of hematemesis and none since hospital discharge. Patient takes a baby aspirin at home, no other NSAIDs. His hemoglobin on admission was 18.6, at discharge, after rehydration, it was normal at 16.5. Of note, his MCV was low at 74 but he has chronic microcytosis dating back to at least to 2008. During his hospital admission the patient did have some dysphagia, mainly to pills. He had no prior history of dysphagia and has not had any dysplasia since hospital discharge. Barium swallow in the hospital was normal.    Patient had a hospital followup with Dr. Jacalyn Lefevre office on 01/13/2011. Per their note, Coumadin was deferred in the hospital because of hematemesis. The physician assistant of Dr. Jacalyn Lefevre office started the patient on Pepcid twice a day but for unclear reasons the patient is not taking that medication. He was actually discharged from the hospital on Protonix. but apparently it was too costly. Cardiology feels the patient may need Coumadin but wanted him evaluated  by [place] first  Past Medical History  Diagnosis Date  . Inguinal hernia without mention of obstruction or gangrene, unilateral or unspecified, (not specified as recurrent)   . 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 Peripheral vascular disease, unspecified   . Carotid stenosis     dopplers 1/11: 60-79% bilat.  . Hyperlipidemia     mixed  . Hypertension     a. echo 4/12: EF 50%, asymmetric septal hypertrophy, no SAM or LVOT gradient, LAE, PASP 35  . Coronary artery disease     a. s/p aborted ant STEMI tx with Cypher DES to LAD 10/04 (residual at cath: D1 50%, CFX 40% and multiple dist 70%, EF 55%);   b. myoview 3/10: Ef 47%, infero-apical isch, LOW RISK - med Tx recommended  . Glucose intolerance (impaired glucose tolerance)   . CKD (chronic kidney disease)   . Paroxysmal atrial fibrillation 12/2010  . Diverticulosis   . Diabetes mellitus   . Atrial fibrillation   . H/O: CVA (cardiovascular accident)    Past Surgical History  Procedure Date  . Popliteal artery angioplasty 01/07/07    s/p LEFT POPLITEAL ARTERY EXPLORATION AND VEIN PATCH ANGIOPLASTY, PER DR. EARLY, SECONDARY TO ISCHEMIC LEFT FOOT RELATED TO LEFT POPLITEAL ARTERY INJURY  . Orif tibia & fibula fractures 01/11/07    OPEN TX OF UNICONDYLAR PLATEAU FRACTURE, IRRIGATION/DEBRIDEMENT OF OPEN FRACTURE INCLUDING BONE, REMOVAL OF EXTERNAL FIXATOR UNDER ANESTHESIA PER DR. MICHAEL HANDY    reports that he quit smoking about 5 months ago. His smoking use included Cigarettes. He smoked 1 pack per day. He  has never used smokeless tobacco. He reports that he does not drink alcohol or use illicit drugs. family history includes Brain cancer in his mother and Heart disease in his father. No Known Allergies    Outpatient Encounter Prescriptions as of 02/10/2011  Medication Sig Dispense Refill  . aspirin 81 MG EC tablet Take 81 mg by mouth daily.        Marland Kitchen diltiazem (CARDIZEM CD) 180 MG 24 hr capsule Take 1  capsule (180 mg total) by mouth daily.  30 capsule  6  . famotidine (PEPCID) 20 MG tablet Take 1 tablet (20 mg total) by mouth 2 (two) times daily.  60 tablet  11  . furosemide (LASIX) 40 MG tablet Take 1 tablet (40 mg total) by mouth daily.  30 tablet  11  . hydrALAZINE (APRESOLINE) 50 MG tablet Take 50 mg by mouth 3 (three) times daily.        . potassium chloride (K-DUR) 10 MEQ tablet Take 1 tablet (10 mEq total) by mouth daily.  30 tablet  11  . simvastatin (ZOCOR) 40 MG tablet Take 1 tablet (40 mg total) by mouth every evening.  30 tablet  11     REVIEW OF SYSTEMS  : Patient has had mild weight loss, fatigue. All other systems reviewed and negative except where noted in the History of Present Illness.   PHYSICAL EXAM: General: Well developed black male in no acute distress Head: Normocephalic and atraumatic Eyes:  sclerae anicteric,conjunctive pink. Ears: Normal auditory acuity Mouth: No deformity or lesions Neck: Supple, no masses.  Lungs: Clear throughout to auscultation Heart: Regular rate and rhythm; soft murmur lower sternal border. Abdomen: Soft, non distended, nontender. No masses or hepatomegaly noted. Normal bowel sounds Rectal: Light brown, heme negative stool Musculoskeletal: Symmetrical with no gross deformities  Skin: No lesions on visible extremities Extremities: No edema or deformities noted Neurological: Alert oriented x 4, grossly nonfocal Cervical Nodes:  No significant cervical adenopathy Psychological:  Alert and cooperative. Normal mood and affect  ASSESSMENT AND PLAN;

## 2011-02-10 NOTE — Assessment & Plan Note (Signed)
Diagnosed with new onset atrial fibrillation with rapid ventricular response in April 2012. Patient currently on Cardizem, Coumadin not started secondary to hematemesis in the hospital. He may need Coumadin in the near future.

## 2011-02-10 NOTE — Patient Instructions (Signed)
Call and schedule you Colon/Endo with Dr. Deatra Ina.  You will have to come in for pre-visit 1 week prior to your procedure for nurse to go over instructions with you.  Call [phone] to schedule.

## 2011-02-11 ENCOUNTER — Encounter: Payer: Self-pay | Admitting: Gastroenterology

## 2011-02-16 ENCOUNTER — Encounter: Payer: Self-pay | Admitting: Gastroenterology

## 2011-02-17 NOTE — Telephone Encounter (Signed)
LMTCB ./CY 

## 2011-02-22 ENCOUNTER — Encounter: Payer: Self-pay | Admitting: Cardiology

## 2011-02-22 NOTE — Telephone Encounter (Signed)
PT  AWARE OF  MONITOR RESULTS ./CY 

## 2011-02-23 ENCOUNTER — Other Ambulatory Visit: Payer: Medicare Other | Admitting: Gastroenterology

## 2011-03-04 ENCOUNTER — Encounter: Payer: Medicare Other | Admitting: Gastroenterology

## 2011-03-07 ENCOUNTER — Encounter: Payer: Medicare Other | Admitting: Gastroenterology

## 2011-03-22 ENCOUNTER — Ambulatory Visit: Payer: Medicare Other | Admitting: Gastroenterology

## 2011-03-30 ENCOUNTER — Encounter: Payer: Medicare Other | Admitting: Gastroenterology

## 2011-06-13 LAB — CBC
HCT: 35.8 — ABNORMAL LOW
MCHC: 34.3
MCV: 75.2 — ABNORMAL LOW
Platelets: 320
RBC: 4.76

## 2011-06-13 LAB — COMPREHENSIVE METABOLIC PANEL
AST: 14
BUN: 21
CO2: 26
Calcium: 9.6
Chloride: 108
Creatinine, Ser: 1.55 — ABNORMAL HIGH
GFR calc Af Amer: 55 — ABNORMAL LOW
GFR calc non Af Amer: 45 — ABNORMAL LOW
Total Bilirubin: 0.7

## 2011-06-13 LAB — DIFFERENTIAL
Basophils Relative: 0
Eosinophils Absolute: 0.1 — ABNORMAL LOW
Eosinophils Relative: 2
Lymphs Abs: 2.1
Neutrophils Relative %: 54

## 2011-06-13 LAB — URINALYSIS, ROUTINE W REFLEX MICROSCOPIC
Bilirubin Urine: NEGATIVE
Glucose, UA: NEGATIVE
Hgb urine dipstick: NEGATIVE
Protein, ur: NEGATIVE
Urobilinogen, UA: 0.2

## 2011-06-15 LAB — URINALYSIS, ROUTINE W REFLEX MICROSCOPIC
Glucose, UA: NEGATIVE
Hgb urine dipstick: NEGATIVE
Hgb urine dipstick: NEGATIVE
Ketones, ur: NEGATIVE
Leukocytes, UA: NEGATIVE
Leukocytes, UA: NEGATIVE
Nitrite: NEGATIVE
Protein, ur: 100 — AB
Protein, ur: 30 — AB
Specific Gravity, Urine: 1.023
Urobilinogen, UA: 0.2

## 2011-06-15 LAB — BASIC METABOLIC PANEL
CO2: 34 — ABNORMAL HIGH
Calcium: 9.2
Chloride: 92 — ABNORMAL LOW
GFR calc Af Amer: 59 — ABNORMAL LOW
Potassium: 3 — ABNORMAL LOW
Sodium: 136

## 2011-06-15 LAB — DIFFERENTIAL
Basophils Relative: 0
Lymphocytes Relative: 25
Lymphs Abs: 2.1
Monocytes Relative: 8
Neutro Abs: 5.7
Neutrophils Relative %: 67

## 2011-06-15 LAB — COMPREHENSIVE METABOLIC PANEL
BUN: 27 — ABNORMAL HIGH
Calcium: 9.7
Creatinine, Ser: 1.54 — ABNORMAL HIGH
Glucose, Bld: 115 — ABNORMAL HIGH
Total Protein: 7.3

## 2011-06-15 LAB — CBC
HCT: 41.2
Hemoglobin: 13.9
MCHC: 33.8
Platelets: 344
RDW: 17.7 — ABNORMAL HIGH

## 2011-06-15 LAB — URINE MICROSCOPIC-ADD ON

## 2011-06-23 LAB — BASIC METABOLIC PANEL
Calcium: 8.9
GFR calc Af Amer: >60
GFR calc non Af Amer: >60
Glucose, Bld: 119 — ABNORMAL HIGH
Potassium: 3.1 — ABNORMAL LOW
Sodium: 131 — ABNORMAL LOW

## 2011-06-23 LAB — CBC
HCT: 33 — ABNORMAL LOW
Hemoglobin: 10.9 — ABNORMAL LOW
RBC: 4.21 — ABNORMAL LOW
RDW: 15.3 — ABNORMAL HIGH

## 2011-06-23 LAB — APTT: aPTT: 43 — ABNORMAL HIGH

## 2011-06-23 LAB — DIFFERENTIAL
Basophils Relative: 0
Lymphocytes Relative: 14
Lymphs Abs: 1.3
Monocytes Absolute: 1 — ABNORMAL HIGH
Monocytes Relative: 11
Neutro Abs: 7
Neutrophils Relative %: 75

## 2011-06-23 LAB — PROTIME-INR
INR: 1.6 — ABNORMAL HIGH
Prothrombin Time: 19.3 — ABNORMAL HIGH

## 2011-11-10 ENCOUNTER — Encounter: Payer: Medicare Other | Admitting: Cardiology

## 2011-11-10 NOTE — Progress Notes (Signed)
HPI: Logan White is a very pleasant gentleman who has a history of PCI of his LAD in October 2004.  His last Myoview was performed on November 12, 2008. Ejection fraction was 47%. There was a small area of potential ischemia at the infero-apex. It was felt to be low risk and we are treating medically.  He also has cerebrovascular disease. Last carotid Doppler performed in May 2012 showed 40-59% bilateral stenosis. Followup was recommended in 12 months. Note abdominal ultrasound in May of 2007 showed no aneurysm. Patient apparently had episode of nausea/vomiting and hematemesis in April of 2012. It was felt his atrial fibrillation may have been stress related. He had a followup Holter monitor in May of 2012 that showed PACs, PVCs and one brief run of PAT. He was to followup with me in May but did not return. He was seen by gastroenterology and had EGD and colonoscopy. I do not have those records available. Since he was last seen here,   Current Outpatient Prescriptions  Medication Sig Dispense Refill  . aspirin 81 MG EC tablet Take 81 mg by mouth daily.        Marland Kitchen diltiazem (CARDIZEM CD) 180 MG 24 hr capsule Take 1 capsule (180 mg total) by mouth daily.  30 capsule  6  . famotidine (PEPCID) 20 MG tablet Take 1 tablet (20 mg total) by mouth 2 (two) times daily.  60 tablet  11  . furosemide (LASIX) 40 MG tablet Take 1 tablet (40 mg total) by mouth daily.  30 tablet  11  . hydrALAZINE (APRESOLINE) 50 MG tablet Take 50 mg by mouth 3 (three) times daily.        . potassium chloride (K-DUR) 10 MEQ tablet Take 1 tablet (10 mEq total) by mouth daily.  30 tablet  11  . simvastatin (ZOCOR) 40 MG tablet Take 1 tablet (40 mg total) by mouth every evening.  30 tablet  11     Past Medical History  Diagnosis Date  . Inguinal hernia without mention of obstruction or gangrene, unilateral or unspecified, (not specified as recurrent)   . 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  Peripheral vascular disease, unspecified   . Carotid stenosis     dopplers 1/11: 60-79% bilat.  . Hyperlipidemia     mixed  . Hypertension     a. echo 4/12: EF 50%, asymmetric septal hypertrophy, no SAM or LVOT gradient, LAE, PASP 35  . Coronary artery disease     a. s/p aborted ant STEMI tx with Cypher DES to LAD 10/04 (residual at cath: D1 50%, CFX 40% and multiple dist 70%, EF 55%);   b. myoview 3/10: Ef 47%, infero-apical isch, LOW RISK - med Tx recommended  . Glucose intolerance (impaired glucose tolerance)   . CKD (chronic kidney disease)   . Paroxysmal atrial fibrillation 12/2010  . Diverticulosis   . Diabetes mellitus   . Atrial fibrillation   . H/O: CVA (cardiovascular accident)     Past Surgical History  Procedure Date  . Popliteal artery angioplasty 01/07/07    s/p LEFT POPLITEAL ARTERY EXPLORATION AND VEIN PATCH ANGIOPLASTY, PER DR. EARLY, SECONDARY TO ISCHEMIC LEFT FOOT RELATED TO LEFT POPLITEAL ARTERY INJURY  . Orif tibia & fibula fractures 01/11/07    OPEN TX OF UNICONDYLAR PLATEAU FRACTURE, IRRIGATION/DEBRIDEMENT OF OPEN FRACTURE INCLUDING BONE, REMOVAL OF EXTERNAL FIXATOR UNDER ANESTHESIA PER DR. MICHAEL HANDY    History   Social History  . Marital Status: Married  Spouse Name: N/A    Number of Children: 4  . Years of Education: N/A   Occupational History  . 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 Smoking status: Former Smoker -- 1.0 packs/day    Types: Cigarettes    Quit date: 09/08/2010  . Smokeless tobacco: Never Used  . Alcohol Use: No  . Drug Use: No  . Sexually Active: Not on file   Other Topics Concern  . 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 1/2 -1 PPD OF CIGARETTES NO ETOH    ROS: no fevers or chills, productive cough, hemoptysis, dysphasia, odynophagia, melena, hematochezia, dysuria, hematuria, rash, seizure activity, orthopnea, PND, pedal edema,  claudication. Remaining systems are negative.  Physical Exam: Well-developed well-nourished in no acute distress.  Skin is warm and dry.  HEENT is normal.  Neck is supple. No thyromegaly.  Chest is clear to auscultation with normal expansion.  Cardiovascular exam is regular rate and rhythm.  Abdominal exam nontender or distended. No masses palpated. Extremities show no edema. neuro grossly intact  ECG     This encounter was created in error - please disregard.

## 2011-11-16 ENCOUNTER — Telehealth: Payer: Self-pay | Admitting: Cardiology

## 2011-11-16 NOTE — Telephone Encounter (Signed)
Please return call to patient wife Mardene Celeste [phone]  Patient has been off of his meds for the last several months, and wife is wondering if it is safe to start taking them again.  She can be reached at [phone]

## 2011-11-16 NOTE — Telephone Encounter (Signed)
Unable to reach pt or leave a message, numbers in chart are invalid

## 2011-11-17 NOTE — Telephone Encounter (Signed)
Fu call Patient's wife called back and gave correct phone number please call back

## 2011-11-17 NOTE — Telephone Encounter (Signed)
Patient should resume cardizem and needs fu ov Kirk Ruths

## 2011-11-17 NOTE — Telephone Encounter (Signed)
Pt set up app. Pt stopped taking diltiazem long ago/ wife not sure why thought he just forgot the medication. Wants to know if he can just restart the med. Told her Dr [name] and nurse are out of office today but will return tomorrow. Please call and advise if he can restart med.

## 2011-11-18 MED ORDER — DILTIAZEM HCL ER COATED BEADS 180 MG PO CP24: 180.0000 mg | ORAL_CAPSULE | Freq: Every day | ORAL | Status: DC

## 2011-11-18 NOTE — Telephone Encounter (Signed)
Spoke with pt, he is aware of dr Jacalyn Lefevre recommendations. Aware of f/u appt.

## 2011-11-18 NOTE — Telephone Encounter (Signed)
pts wife calling to give [place] pt's number, instead of hers, pls call [phone]

## 2011-11-25 ENCOUNTER — Ambulatory Visit (INDEPENDENT_AMBULATORY_CARE_PROVIDER_SITE_OTHER)
Admission: RE | Admit: 2011-11-25 | Discharge: 2011-11-25 | Disposition: A | Discharge status: Home / Self Care | Payer: Medicare Other | Source: Ambulatory Visit | Attending: Cardiology | Admitting: Cardiology

## 2011-11-25 ENCOUNTER — Encounter: Payer: Self-pay | Admitting: Cardiology

## 2011-11-25 ENCOUNTER — Telehealth: Payer: Self-pay | Admitting: *Deleted

## 2011-11-25 ENCOUNTER — Ambulatory Visit (INDEPENDENT_AMBULATORY_CARE_PROVIDER_SITE_OTHER): Payer: Medicare Other | Admitting: Cardiology

## 2011-11-25 ENCOUNTER — Telehealth: Payer: Self-pay | Admitting: Cardiology

## 2011-11-25 VITALS — BP 148/90 | HR 80 | Ht 75.0 in | Wt 187.0 lb

## 2011-11-25 DIAGNOSIS — I4891 Unspecified atrial fibrillation: Secondary | ICD-10-CM

## 2011-11-25 DIAGNOSIS — R0600 Dyspnea, unspecified: Secondary | ICD-10-CM

## 2011-11-25 DIAGNOSIS — R609 Edema, unspecified: Secondary | ICD-10-CM

## 2011-11-25 DIAGNOSIS — I499 Cardiac arrhythmia, unspecified: Secondary | ICD-10-CM

## 2011-11-25 DIAGNOSIS — I2510 Atherosclerotic heart disease of native coronary artery without angina pectoris: Secondary | ICD-10-CM

## 2011-11-25 DIAGNOSIS — E7800 Pure hypercholesterolemia, unspecified: Secondary | ICD-10-CM

## 2011-11-25 DIAGNOSIS — R0989 Other specified symptoms and signs involving the circulatory and respiratory systems: Secondary | ICD-10-CM

## 2011-11-25 DIAGNOSIS — E876 Hypokalemia: Secondary | ICD-10-CM

## 2011-11-25 DIAGNOSIS — R0609 Other forms of dyspnea: Secondary | ICD-10-CM

## 2011-11-25 DIAGNOSIS — K920 Hematemesis: Secondary | ICD-10-CM

## 2011-11-25 LAB — CBC WITH DIFFERENTIAL/PLATELET
Basophils Relative: 0.5 % (ref 0.0–3.0)
Eosinophils Relative: 2.7 % (ref 0.0–5.0)
HCT: 40.4 % (ref 39.0–52.0)
Lymphs Abs: 2.1 10*3/uL (ref 0.7–4.0)
MCV: 78.5 fl (ref 78.0–100.0)
Monocytes Relative: 10.9 % (ref 3.0–12.0)
Platelets: 297 10*3/uL (ref 150.0–400.0)
RBC: 5.15 Mil/uL (ref 4.22–5.81)
WBC: 6.3 10*3/uL (ref 4.5–10.5)

## 2011-11-25 LAB — BASIC METABOLIC PANEL
BUN: 21 mg/dL (ref 6–23)
Chloride: 105 mEq/L (ref 96–112)
GFR: 77.29 mL/min (ref >60.00)
Potassium: 2.8 mEq/L — CL (ref 3.5–5.1)
Sodium: 141 mEq/L (ref 135–145)

## 2011-11-25 LAB — BRAIN NATRIURETIC PEPTIDE: Pro B Natriuretic peptide (BNP): 1460 pg/mL — ABNORMAL HIGH (ref 0.0–100.0)

## 2011-11-25 MED ORDER — POTASSIUM CHLORIDE CRYS ER 20 MEQ PO TBCR: 20.0000 meq | EXTENDED_RELEASE_TABLET | Freq: Two times a day (BID) | ORAL | Status: DC

## 2011-11-25 MED ORDER — FUROSEMIDE 40 MG PO TABS: 40.0000 mg | ORAL_TABLET | Freq: Every day | ORAL | Status: DC

## 2011-11-25 NOTE — Telephone Encounter (Signed)
Message copied by Earvin Hansen on Fri Nov 25, 2011  5:26 PM ------      Message from: Darlin Coco      Created: Fri Nov 25, 2011  4:58 PM       Please report.  The chest x-ray shows an enlarged heart and evidence of too  much fluid in the lungs

## 2011-11-25 NOTE — Assessment & Plan Note (Signed)
EKG today shows atrial fibrillation with a controlled ventricular response.  The patient is unaware as to the duration of his atrial fibrillation.  He is aware of dyspnea but not of his heart rate or rhythm.  In the past the patient has been judged not to be a Coumadin candidate because of compliance issues and question of GI bleeding issues.

## 2011-11-25 NOTE — Telephone Encounter (Signed)
Message copied by Earvin Hansen on Fri Nov 25, 2011  5:23 PM ------      Message from: Darlin Coco      Created: Fri Nov 25, 2011  5:02 PM       Please report.  The test for congestive heart failure was abnormal.  The hemoglobin is normal.  The potassium is very low.  I want him to add Kdur 20 mEq twice a day.  He should also eat foods high in potassium.

## 2011-11-25 NOTE — Patient Instructions (Addendum)
Will obtain labs today and call you with the results (CBC/BMET/BNP)  Will have you go for chest xray today and call you with the results ([place] building across from [facility])  Start on Lasix (furosemide) 40 mg daily  Your physician recommends that you schedule a follow-up appointment in: with Dr [name] in 1-2 week  YOU NEED TO Convoy  Smoking Cessation This document explains the best ways for you to quit smoking and new treatments to help. It lists new medicines that can double or triple your chances of quitting and quitting for good. It also considers ways to avoid relapses and concerns you may have about quitting, including weight gain. NICOTINE: A POWERFUL ADDICTION If you have tried to quit smoking, you know how hard it can be. It is hard because nicotine is a very addictive drug. For some people, it can be as addictive as heroin or cocaine. Usually, people make 2 or 3 tries, or more, before finally being able to quit. Each time you try to quit, you can learn about what helps and what hurts. Quitting takes hard work and a lot of effort, but you can quit smoking. QUITTING SMOKING IS ONE OF THE MOST IMPORTANT THINGS YOU WILL EVER DO.  You will live longer, feel better, and live better.   The impact on your body of quitting smoking is felt almost immediately:   Within 20 minutes, blood pressure decreases. Pulse returns to its normal level.   After 8 hours, carbon monoxide levels in the blood return to normal. Oxygen level increases.   After 24 hours, chance of heart attack starts to decrease. Breath, hair, and body stop smelling like smoke.   After 48 hours, damaged nerve endings begin to recover. Sense of taste and smell improve.   After 72 hours, the body is virtually free of nicotine. Bronchial tubes relax and breathing becomes easier.   After 2 to 12 weeks, lungs can hold more air. Exercise becomes easier and circulation improves.   Quitting will reduce your  risk of having a heart attack, stroke, cancer, or lung disease:   After 1 year, the risk of coronary heart disease is cut in half.   After 5 years, the risk of stroke falls to the same as a nonsmoker.   After 10 years, the risk of lung cancer is cut in half and the risk of other cancers decreases significantly.   After 15 years, the risk of coronary heart disease drops, usually to the level of a nonsmoker.   If you are pregnant, quitting smoking will improve your chances of having a healthy baby.   The people you live with, especially your children, will be healthier.   You will have extra money to spend on things other than cigarettes.  FIVE KEYS TO QUITTING Studies have shown that these 5 steps will help you quit smoking and quit for good. You have the best chances of quitting if you use them together: 1. Get ready.  2. Get support and encouragement.  3. Learn new skills and behaviors.  4. Get medicine to reduce your nicotine addiction and use it correctly.  5. Be prepared for relapse or difficult situations. Be determined to continue trying to quit, even if you do not succeed at first.  1. GET READY  Set a quit date.   Change your environment.   Get rid of ALL cigarettes, ashtrays, matches, and lighters in your home, car, and place of work.   Do not  let people smoke in your home.   Review your past attempts to quit. Think about what worked and what did not.   Once you quit, do not smoke. NOT EVEN A PUFF!  2. GET SUPPORT AND ENCOURAGEMENT Studies have shown that you have a better chance of being successful if you have help. You can get support in many ways.  Tell your family, friends, and coworkers that you are going to quit and need their support. Ask them not to smoke around you.   Talk to your caregivers (doctor, dentist, nurse, pharmacist, psychologist, and/or smoking counselor).   Get individual, group, or telephone counseling and support. The more counseling you  have, the better your chances are of quitting. Programs are available at [employer] and health centers. Call your local health department for information about programs in your area.   Spiritual beliefs and practices may help some smokers quit.   Quit meters are Insurance underwriter that keep track of quit statistics, such as amount of "quit-time," cigarettes not smoked, and money saved.   Many smokers find one or more of the many self-help books available useful in helping them quit and stay off tobacco.  3. LEARN NEW SKILLS AND BEHAVIORS  Try to distract yourself from urges to smoke. Talk to someone, go for a walk, or occupy your time with a task.   When you first try to quit, change your routine. Take a different route to work. Drink tea instead of coffee. Eat breakfast in a different place.   Do something to reduce your stress. Take a hot bath, exercise, or read a book.   Plan something enjoyable to do every day. Reward yourself for not smoking.   Explore interactive web-based programs that specialize in helping you quit.  4. GET MEDICINE AND USE IT CORRECTLY Medicines can help you stop smoking and decrease the urge to smoke. Combining medicine with the above behavioral methods and support can quadruple your chances of successfully quitting smoking. The U.S. Food and Drug Administration (FDA) has approved 7 medicines to help you quit smoking. These medicines fall into 3 categories.  Nicotine replacement therapy (delivers nicotine to your body without the negative effects and risks of smoking):   Nicotine gum: Available over-the-counter.   Nicotine lozenges: Available over-the-counter.   Nicotine inhaler: Available by prescription.   Nicotine nasal spray: Available by prescription.   Nicotine skin patches (transdermal): Available by prescription and over-the-counter.   Antidepressant medicine (helps people abstain from smoking, but how this works is  unknown):   Bupropion sustained-release (SR) tablets: Available by prescription.   Nicotinic receptor partial agonist (simulates the effect of nicotine in your brain):   Varenicline tartrate tablets: Available by prescription.   Ask your caregiver for advice about which medicines to use and how to use them. Carefully read the information on the package.   Everyone who is trying to quit may benefit from using a medicine. If you are pregnant or trying to become pregnant, nursing an infant, you are under age 84, or you smoke fewer than 10 cigarettes per day, talk to your caregiver before taking any nicotine replacement medicines.   You should stop using a nicotine replacement product and call your caregiver if you experience nausea, dizziness, weakness, vomiting, fast or irregular heartbeat, mouth problems with the lozenge or gum, or redness or swelling of the skin around the patch that does not go away.   Do not use any other product containing nicotine  while using a nicotine replacement product.   Talk to your caregiver before using these products if you have diabetes, heart disease, asthma, stomach ulcers, you had a recent heart attack, you have high blood pressure that is not controlled with medicine, a history of irregular heartbeat, or you have been prescribed medicine to help you quit smoking.  5. BE PREPARED FOR RELAPSE OR DIFFICULT SITUATIONS  Most relapses occur within the first 3 months after quitting. Do not be discouraged if you start smoking again. Remember, most people try several times before they finally quit.   You may have symptoms of withdrawal because your body is used to nicotine. You may crave cigarettes, be irritable, feel very hungry, cough often, get headaches, or have difficulty concentrating.   The withdrawal symptoms are only temporary. They are strongest when you first quit, but they will go away within 10 to 14 days.  Here are some difficult situations to watch  for:  Alcohol. Avoid drinking alcohol. Drinking lowers your chances of successfully quitting.   Caffeine. Try to reduce the amount of caffeine you consume. It also lowers your chances of successfully quitting.   Other smokers. Being around smoking can make you want to smoke. Avoid smokers.   Weight gain. Many smokers will gain weight when they quit, usually less than 10 pounds. Eat a healthy diet and stay active. Do not let weight gain distract you from your main goal, quitting smoking. Some medicines that help you quit smoking may also help delay weight gain. You can always lose the weight gained after you quit.   Bad mood or depression. There are a lot of ways to improve your mood other than smoking.  If you are having problems with any of these situations, talk to your caregiver. SPECIAL SITUATIONS AND CONDITIONS Studies suggest that everyone can quit smoking. Your situation or condition can give you a special reason to quit.  Pregnant women/new mothers: By quitting, you protect your baby's health and your own.   Hospitalized patients: By quitting, you reduce health problems and help healing.   Heart attack patients: By quitting, you reduce your risk of a second heart attack.   Lung, head, and neck cancer patients: By quitting, you reduce your chance of a second cancer.   Parents of children and adolescents: By quitting, you protect your children from illnesses caused by secondhand smoke.  QUESTIONS TO THINK ABOUT Think about the following questions before you try to stop smoking. You may want to talk about your answers with your caregiver.  Why do you want to quit?   If you tried to quit in the past, what helped and what did not?   What will be the most difficult situations for you after you quit? How will you plan to handle them?   Who can help you through the tough times? Your family? Friends? Caregiver?   What pleasures do you get from smoking? What ways can you still get  pleasure if you quit?  Here are some questions to ask your caregiver:  How can you help me to be successful at quitting?   What medicine do you think would be best for me and how should I take it?   What should I do if I need more help?   What is smoking withdrawal like? How can I get information on withdrawal?  Quitting takes hard work and a lot of effort, but you can quit smoking. FOR MORE INFORMATION  Smokefree.gov (Inrails.tn) provides free, accurate,  evidence-based information and professional assistance to help support the immediate and long-term needs of people trying to quit smoking. Document Released: 08/16/2001 Document Revised: 08/11/2011 Document Reviewed: 06/08/2009 [facility] Patient Information 2012 [place].Sodium-Controlled Diet  WATCH YOUR SALT INTAKE  Sodium is a mineral. It is found in many foods. Sodium may be found naturally or added during the making of a food. The most common form of sodium is salt, which is made up of sodium and chloride. Reducing your sodium intake involves changing your eating habits. The following guidelines will help you reduce the sodium in your diet:  Stop using the salt shaker.   Use salt sparingly in cooking and baking.   Substitute with sodium-free seasonings and spices.   Do not use a salt substitute (potassium chloride) without your caregiver's permission.   Include a variety of fresh, unprocessed foods in your diet.   Limit the use of processed and convenience foods that are high in sodium.  USE THE FOLLOWING FOODS SPARINGLY: Breads/Starches  Commercial bread stuffing, commercial pancake or waffle mixes, coating mixes. Waffles. Croutons. Prepared (boxed or frozen) potato, rice, or noodle mixes that contain salt or sodium. Salted [place] fries or hash browns. Salted popcorn, breads, crackers, chips, or snack foods.  Vegetables  Vegetables canned with salt or prepared in cream, butter, or cheese sauces.  Sauerkraut. Tomato or vegetable juices canned with salt.   Fresh vegetables are allowed if rinsed thoroughly.  Fruit  Fruit is okay to eat.  Meat and Meat Substitutes  Salted or smoked meats, such as bacon or Canadian bacon, chipped or corned beef, hot dogs, salt pork, luncheon meats, pastrami, ham, or sausage. Canned or smoked fish, poultry, or meat. Processed cheese or cheese spreads, blue or Roquef[place] cheese. Battered or frozen fish products. Prepared spaghetti sauce. Baked beans. Reuben sandwiches. Salted nuts. Caviar.  Milk  Limit buttermilk to 1 cup per week.  Soups and Combination Foods  Bouillon cubes, canned or dried soups, broth, consomm. Convenience (frozen or packaged) dinners with more than 600 mg sodium. Pot pies, pizza, Asian food, fast food cheeseburgers, and specialty sandwiches.  Desserts and Sweets  Regular (salted) desserts, pie, commercial fruit snack pies, commercial snack cakes, canned puddings.   Eat desserts and sweets in moderation.  Fats and Oils  Gravy mixes or canned gravy. No more than 1 to 2 tbs of salad dressing. Chip dips.   Eat fats and oils in moderation.  Beverages  See those listed under the vegetables and milk groups.  Condiments  Ketchup, mustard, meat sauces, salsa, regular (salted) and lite soy sauce or mustard. Dill pickles, olives, meat tenderizer. Prepared horseradish or pickle relish. Dutch-processed cocoa. Baking powder or baking soda used medicinally. Worcestershire sauce. "Light" salt. Salt substitute, unless approved by your caregiver.  Document Released: 02/11/2002 Document Revised: 08/11/2011 Document Reviewed: 09/14/2009 [facility] Patient Information 2012 [place].

## 2011-11-25 NOTE — Telephone Encounter (Signed)
Pt picked up rx diltiazem 3-15, Saturday the 16th leg and foot swollen, still swollen now, wife checked the med online and found out this is a side effect also having  SOB, pls advise [phone]

## 2011-11-25 NOTE — Assessment & Plan Note (Signed)
The patient has been dyspneic for the past week or so.  This corresponds to the Timentin as noted development of peripheral edema.  The patient has a past history of chronic renal insufficiency.  The patient is not on Lasix and we will start Lasix 40 mg daily and get baseline labs today

## 2011-11-25 NOTE — Progress Notes (Addendum)
Logan White Date of Birth:  Dec 04, 1940 [employer] [address] [place], [place]  [address] [phone]         Fax   [phone]  History of Present Illness: This 71 year old gentleman is seen as a work in  office visit.  He is a patient of Dr. [name].  He has not been seen in the office since May 2012.  He comes in today because of increasing shortness of breath and increasing peripheral edema affecting predominantly the right leg.  He has not been experiencing any chest pain.  He has had a cough.  He has had orthopnea.  He states that he quit smoking about a week ago.  He has had no fever.  His sputum is clear.  His right leg has been swelling since one week ago.  There is no pain on walking and the swelling is painless.  The patient has a past history of ischemic heart disease In 2004he had a STEMI treated with a drug-eluting stent to the LAD.  He had a Myoview in March 2010 showing an ejection fraction of 47% with inferoapical ischemia.  He's had a past history of chronic kidney disease, glucose intolerance, high blood pressure, hyperlipidemia, paroxysmal atrial fibrillation, and peripheral vascular disease.  Current Outpatient Prescriptions  Medication Sig Dispense Refill  . aspirin 81 MG EC tablet Take 81 mg by mouth daily.        Marland Kitchen diltiazem (CARDIZEM CD) 180 MG 24 hr capsule Take 1 capsule (180 mg total) by mouth daily.  30 capsule  6  . lisinopril (PRINIVIL,ZESTRIL) 40 MG tablet Take 40 mg by mouth daily.      . simvastatin (ZOCOR) 40 MG tablet Take 1 tablet (40 mg total) by mouth every evening.  30 tablet  11  . furosemide (LASIX) 40 MG tablet Take 1 tablet (40 mg total) by mouth daily.  30 tablet  11    No Known Allergies  Patient Active Problem List  Diagnoses  . GLUCOSE INTOLERANCE  . HYPERLIPIDEMIA-MIXED  . HYPERPOTASSEMIA  . TOBACCO ABUSE  . HYPERTENSION  . CAD  . CEREBROVASCULAR DISEASE  . PERIPHERAL VASCULAR DISEASE  . INGUINAL HERNIA   . RENAL INSUFFICIENCY  . Atrial fibrillation  . Hematemesis  . Dyspnea  . Screening for colon cancer    History  Smoking status  . Former Smoker -- 1.0 packs/day  . Types: Cigarettes  . Quit date: 09/08/2010  Smokeless tobacco  . Never Used    History  Alcohol Use No    Family History  Problem Relation Age of Onset  . Heart disease Father     ALSO UNCLE DIED HAD CAD  . Brain cancer Mother     Review of Systems: Constitutional: no fever chills diaphoresis or fatigue or change in weight.  Head and neck: no hearing loss, no epistaxis, no photophobia or visual disturbance. Respiratory: No cough, shortness of breath or wheezing. Cardiovascular: No chest pain peripheral edema, palpitations. Gastrointestinal: No abdominal distention, no abdominal pain, no change in bowel habits hematochezia or melena. Genitourinary: No dysuria, no frequency, no urgency, no nocturia. Musculoskeletal:No arthralgias, no back pain, no gait disturbance or myalgias. Neurological: No dizziness, no headaches, no numbness, no seizures, no syncope, no weakness, no tremors. Hematologic: No lymphadenopathy, no easy bruising. Psychiatric: No confusion, no hallucinations, no sleep disturbance.    Physical Exam: Filed Vitals:   11/25/11 1201  BP: 148/90  Pulse: 80   the general appearance reveals a  thin elderly African American gentleman who is mildly dyspneic at rest.Pupils equal and reactive.   Extraocular Movements are full.  There is no scleral icterus.  The mouth and pharynx are normal.  The neck is supple.  The carotids reveal no bruits.  The jugular venous pressure is normal.  The thyroid is not enlarged.  There is no lymphadenopathy.  The chest reveals mild expiratory wheezing at the bases.  The heart reveals an irregular rhythm without murmur gallop rub or click.  The abdomen is scaphoid without tenderness.  The liver percusses slightly enlarged.  Extremities reveal 3+ pitting edema on the right  and 2+ on the left.  Pedal pulses are difficult to feel because of the edema. Strength is normal and symmetrical in all extremities.  There is no lateralizing weakness.  There are no sensory deficits.  The skin is warm and dry.  There is no rash.  EKG today shows atrial fibrillation with a controlled ventricular response.  Occasional PVCs are noted.  The patient has evidence of an old anterolateral myocardial infarction and evidence of ST-T wave abnormalities consistent with inferolateral ischemia or digitalis effect   Assessment / Plan: 1. dyspnea and peripheral edema consistent with recurrent congestive heart failure.  His previous ejection fraction was 47%.  He has not been on any diuretic and he has not been on a low salt diet. 2. atrial fibrillation of unknown duration in a patient who is felt to be a poor candidate for long-term anticoagulation other than aspirin. 3.  History of chronic renal insufficiency 4. past history of hematemesis seen by GI but never followed up with endoscopy and colonoscopy apparently.  Plan will be to add furosemide 40 mg daily.  We will get a chest x-ray today to evaluate dyspnea and cough.  We are getting baseline lab work including CBC, basal metabolic panel and a B. natruretic peptide today.  Patient was counseled not to resume cigarette smoking.  He was advised to adhere to a low-salt diet.  I want him to return for followup office visit with Dr. [name] in one to 2 weeks

## 2011-11-25 NOTE — Telephone Encounter (Signed)
Advises wife and patient

## 2011-11-25 NOTE — Assessment & Plan Note (Signed)
The patient denies any recent chest pain to suggest recurrent angina pectoris

## 2011-11-25 NOTE — Assessment & Plan Note (Signed)
The patient had a past history of hematemesis in May 2012.  He saw Dr. Deatra Ina who felt that he might have had a Mallory-Weiss tear.  Dr. Deatra Ina recommended colonoscopy and endoscopy but apparently the patient never followed up and never had those procedures.  He denies any recent hematochezia or melena.  He remains on a baby aspirin daily.

## 2011-11-25 NOTE — Telephone Encounter (Signed)
Fu call °Patient returning your call °

## 2011-11-25 NOTE — Telephone Encounter (Signed)
Pt reports after taking Diltiazem on Friday and Saturday he noticed his right leg and foot were swollen. It has been swollen since last Saturday  "I thought I had been bitten by something" He reports some shortness of breath.   He states he is taking all of his medications. Pt does not think his Diltiazem is the same. ? Weight 182 according to pt. He does not weigh daily Dr. [name] reviewed. Recommended pt come in today for review. Pt will see Dr. Mare Ferrari today at 11:30.  He will bring in all of his medications Horton Chin RN

## 2011-11-25 NOTE — Telephone Encounter (Signed)
Advised patient and wife of labs and chest xray results.  Will recheck bmet next week

## 2011-11-25 NOTE — Telephone Encounter (Signed)
Seen as a work in today

## 2011-11-28 ENCOUNTER — Ambulatory Visit: Payer: Medicare Other | Admitting: Cardiology

## 2011-11-30 ENCOUNTER — Ambulatory Visit (INDEPENDENT_AMBULATORY_CARE_PROVIDER_SITE_OTHER): Payer: Medicare Other | Admitting: *Deleted

## 2011-11-30 DIAGNOSIS — I10 Essential (primary) hypertension: Secondary | ICD-10-CM

## 2011-11-30 LAB — BASIC METABOLIC PANEL
CO2: 30 mEq/L (ref 19–32)
Chloride: 101 mEq/L (ref 96–112)
Creatinine, Ser: 1.7 mg/dL — ABNORMAL HIGH (ref 0.4–1.5)
Glucose, Bld: 74 mg/dL (ref 70–99)
Sodium: 138 mEq/L (ref 135–145)

## 2011-12-01 ENCOUNTER — Telehealth: Payer: Self-pay | Admitting: *Deleted

## 2011-12-01 DIAGNOSIS — E876 Hypokalemia: Secondary | ICD-10-CM

## 2011-12-01 MED ORDER — POTASSIUM CHLORIDE CRYS ER 20 MEQ PO TBCR: 20.0000 meq | EXTENDED_RELEASE_TABLET | Freq: Three times a day (TID) | ORAL | Status: DC

## 2011-12-01 NOTE — Telephone Encounter (Signed)
Message copied by Earvin Hansen on Thu Dec 01, 2011  5:35 PM ------      Message from: Darlin Coco      Created: Wed Nov 30, 2011  9:31 PM       Please report.  K is still low. Increase KDUR 20 to TID

## 2011-12-01 NOTE — Telephone Encounter (Signed)
Advised patient of lab results  

## 2011-12-16 ENCOUNTER — Encounter: Payer: Self-pay | Admitting: Cardiology

## 2011-12-16 ENCOUNTER — Ambulatory Visit (INDEPENDENT_AMBULATORY_CARE_PROVIDER_SITE_OTHER): Payer: Medicare Other | Admitting: Cardiology

## 2011-12-16 VITALS — BP 124/70 | HR 80 | Ht 75.0 in | Wt 170.0 lb

## 2011-12-16 DIAGNOSIS — R0609 Other forms of dyspnea: Secondary | ICD-10-CM

## 2011-12-16 DIAGNOSIS — R0600 Dyspnea, unspecified: Secondary | ICD-10-CM

## 2011-12-16 DIAGNOSIS — I4891 Unspecified atrial fibrillation: Secondary | ICD-10-CM

## 2011-12-16 LAB — BASIC METABOLIC PANEL
BUN: 24 mg/dL — ABNORMAL HIGH (ref 6–23)
Chloride: 106 mEq/L (ref 96–112)
Creatinine, Ser: 1.5 mg/dL (ref 0.4–1.5)
Glucose, Bld: 91 mg/dL (ref 70–99)
Potassium: 4.2 mEq/L (ref 3.5–5.1)

## 2011-12-16 NOTE — Assessment & Plan Note (Signed)
Improved. Continue Lasix for CHF. Check potassium and renal function. Check BNP.

## 2011-12-16 NOTE — Assessment & Plan Note (Signed)
Continue aspirin and statin. Schedule Myoview for risk stratification. 

## 2011-12-16 NOTE — Assessment & Plan Note (Signed)
Blood pressure controlled. Continue present medications. 

## 2011-12-16 NOTE — Progress Notes (Signed)
HPI: Pleasant male for fu of atrial fibrillation and CAD. Patient with a history of PCI of his LAD in October 2004. His last Myoview was performed on November 12, 2008. Ejection fraction was 47%. There was a small area of potential ischemia at the infero-apex. It was felt to be low risk and we are treating medically. He also has cerebrovascular disease. Last carotid Doppler performed in May 2012 showed 40-59% bilateral stenosis. Followup was recommended in 12 months. He was admitted in April 2012 with N/V and hematemesis and new onset AFib with RVR. He was seen by cardiology. Coumadin was deferred due to hematemesis. He was not seen by GI. He did have a barium swallow due to dysphagia which was normal. His N/V was felt to be due to a gastroenteritis. Patient noted to be in sinus at fu. Seen by Dr Mare Ferrari in March of 2013 with complaints of dyspnea. Chest x-ray showed CHF. He was also noted to be in atrial fibrillation. BNP elevated. Lasix 40 mg daily was added. Note patient was seen by GI following his episode of hematemesis in April of 2012. Colonoscopy and endoscopy recommended but procedures not performed. Patient also with renal insufficiency. Since he was seen previously, he occasionally notes dyspnea but states it occurs after smoking. He denies dyspnea on exertion, orthopnea, PND, chest pain or syncope. His pedal edema has resolved with Lasix. No bleeding.   Current Outpatient Prescriptions  Medication Sig Dispense Refill  . aspirin 81 MG EC tablet Take 81 mg by mouth daily.        Marland Kitchen diltiazem (CARDIZEM CD) 180 MG 24 hr capsule Take 1 capsule (180 mg total) by mouth daily.  30 capsule  6  . furosemide (LASIX) 40 MG tablet Take 1 tablet (40 mg total) by mouth daily.  30 tablet  11  . potassium chloride SA (K-DUR,KLOR-CON) 20 MEQ tablet Take 1 tablet (20 mEq total) by mouth 3 (three) times daily.  90 tablet  5  . simvastatin (ZOCOR) 40 MG tablet Take 1 tablet (40 mg total) by mouth every evening.   30 tablet  11  . DISCONTD: famotidine (PEPCID) 20 MG tablet Take 1 tablet (20 mg total) by mouth 2 (two) times daily.  60 tablet  11  . DISCONTD: hydrALAZINE (APRESOLINE) 50 MG tablet Take 50 mg by mouth 3 (three) times daily.           Past Medical History  Diagnosis Date  . Inguinal hernia without mention of obstruction or gangrene, unilateral or unspecified, (not specified as recurrent)   . 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 Peripheral vascular disease, unspecified   . Carotid stenosis     dopplers 1/11: 60-79% bilat.  . Hyperlipidemia     mixed  . Hypertension     a. echo 4/12: EF 50%, asymmetric septal hypertrophy, no SAM or LVOT gradient, LAE, PASP 35  . Coronary artery disease     a. s/p aborted ant STEMI tx with Cypher DES to LAD 10/04 (residual at cath: D1 50%, CFX 40% and multiple dist 70%, EF 55%);   b. myoview 3/10: Ef 47%, infero-apical isch, LOW RISK - med Tx recommended  . Glucose intolerance (impaired glucose tolerance)   . CKD (chronic kidney disease)   . Paroxysmal atrial fibrillation 12/2010  . Diverticulosis   . Diabetes mellitus   . H/O: CVA (cardiovascular accident)     Past Surgical History  Procedure Date  . Popliteal artery angioplasty 01/07/07  s/p LEFT POPLITEAL ARTERY EXPLORATION AND VEIN PATCH ANGIOPLASTY, PER DR. EARLY, SECONDARY TO ISCHEMIC LEFT FOOT RELATED TO LEFT POPLITEAL ARTERY INJURY  . Orif tibia & fibula fractures 01/11/07    OPEN TX OF UNICONDYLAR PLATEAU FRACTURE, IRRIGATION/DEBRIDEMENT OF OPEN FRACTURE INCLUDING BONE, REMOVAL OF EXTERNAL FIXATOR UNDER ANESTHESIA PER DR. MICHAEL HANDY    History   Social History  . Marital Status: Married    Spouse Name: N/A    Number of Children: 4  . Years of Education: N/A   Occupational History  . 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 Smoking status: Former Smoker -- 1.0 packs/day    Types: Cigarettes    Quit date: 09/08/2010    . Smokeless tobacco: Never Used  . Alcohol Use: No  . Drug Use: No  . Sexually Active: Not on file   Other Topics Concern  . 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 1/2 -1 PPD OF CIGARETTES NO ETOH    ROS: no fevers or chills, productive cough, hemoptysis, dysphasia, odynophagia, melena, hematochezia, dysuria, hematuria, rash, seizure activity, orthopnea, PND, pedal edema, claudication. Remaining systems are negative.  Physical Exam: Well-developed well-nourished in no acute distress.  Skin is warm and dry.  HEENT is normal.  Neck is supple. No thyromegaly.  Chest is clear to auscultation with normal expansion.  Cardiovascular exam is irregular Abdominal exam nontender or distended. No masses palpated. Extremities show no edema. neuro grossly intact

## 2011-12-16 NOTE — Assessment & Plan Note (Signed)
Repeat potassium and renal function. 

## 2011-12-16 NOTE — Assessment & Plan Note (Signed)
Patient counseled on discontinuing. 

## 2011-12-16 NOTE — Patient Instructions (Signed)
Your physician recommends that you have lab work today: BMP, BNP, TSH  Your physician has requested that you have an echocardiogram. Echocardiography is a painless test that uses sound waves to create images of your heart. It provides your doctor with information about the size and shape of your heart and how well your heart's chambers and valves are working. This procedure takes approximately one hour. There are no restrictions for this procedure.  Your physician has requested that you have a lexiscan myoview. For further information please visit [email or web address]. Please follow instruction sheet, as given.  Your physician has requested that you have a carotid duplex. This test is an ultrasound of the carotid arteries in your neck. It looks at blood flow through these arteries that supply the brain with blood. Allow one hour for this exam. There are no restrictions or special instructions.  MAY 2013   Your physician recommends that you schedule a follow-up appointment in: 6 weeks with Dr. [name].  You have been referred to  Dr. Fuller Plan for endoscopy follow-up

## 2011-12-16 NOTE — Assessment & Plan Note (Signed)
Continue statin. 

## 2011-12-16 NOTE — Assessment & Plan Note (Signed)
Continue aspirin and statin. Followup carotid Dopplers May 2013.

## 2011-12-28 ENCOUNTER — Ambulatory Visit (HOSPITAL_COMMUNITY): Payer: Medicare Other | Attending: Cardiovascular Disease

## 2011-12-28 ENCOUNTER — Other Ambulatory Visit: Payer: Self-pay

## 2011-12-28 DIAGNOSIS — I059 Rheumatic mitral valve disease, unspecified: Secondary | ICD-10-CM | POA: Insufficient documentation

## 2011-12-28 DIAGNOSIS — Z87891 Personal history of nicotine dependence: Secondary | ICD-10-CM | POA: Insufficient documentation

## 2011-12-28 DIAGNOSIS — I517 Cardiomegaly: Secondary | ICD-10-CM | POA: Insufficient documentation

## 2011-12-28 DIAGNOSIS — I679 Cerebrovascular disease, unspecified: Secondary | ICD-10-CM | POA: Insufficient documentation

## 2011-12-28 DIAGNOSIS — R0989 Other specified symptoms and signs involving the circulatory and respiratory systems: Secondary | ICD-10-CM | POA: Insufficient documentation

## 2011-12-28 DIAGNOSIS — R0609 Other forms of dyspnea: Secondary | ICD-10-CM | POA: Insufficient documentation

## 2011-12-28 DIAGNOSIS — I10 Essential (primary) hypertension: Secondary | ICD-10-CM | POA: Insufficient documentation

## 2011-12-28 DIAGNOSIS — I079 Rheumatic tricuspid valve disease, unspecified: Secondary | ICD-10-CM | POA: Insufficient documentation

## 2011-12-28 DIAGNOSIS — I739 Peripheral vascular disease, unspecified: Secondary | ICD-10-CM | POA: Insufficient documentation

## 2011-12-28 DIAGNOSIS — I4891 Unspecified atrial fibrillation: Secondary | ICD-10-CM | POA: Insufficient documentation

## 2011-12-28 DIAGNOSIS — N289 Disorder of kidney and ureter, unspecified: Secondary | ICD-10-CM | POA: Insufficient documentation

## 2011-12-28 DIAGNOSIS — I2510 Atherosclerotic heart disease of native coronary artery without angina pectoris: Secondary | ICD-10-CM | POA: Insufficient documentation

## 2011-12-28 DIAGNOSIS — E785 Hyperlipidemia, unspecified: Secondary | ICD-10-CM | POA: Insufficient documentation

## 2012-01-02 ENCOUNTER — Other Ambulatory Visit (HOSPITAL_COMMUNITY): Payer: Medicare Other

## 2012-01-09 ENCOUNTER — Ambulatory Visit (HOSPITAL_COMMUNITY): Payer: Medicare Other | Attending: Internal Medicine | Admitting: Radiology

## 2012-01-09 VITALS — BP 112/92 | Ht 75.5 in | Wt 170.0 lb

## 2012-01-09 DIAGNOSIS — R0609 Other forms of dyspnea: Secondary | ICD-10-CM | POA: Insufficient documentation

## 2012-01-09 DIAGNOSIS — Z8679 Personal history of other diseases of the circulatory system: Secondary | ICD-10-CM | POA: Insufficient documentation

## 2012-01-09 DIAGNOSIS — I2510 Atherosclerotic heart disease of native coronary artery without angina pectoris: Secondary | ICD-10-CM

## 2012-01-09 DIAGNOSIS — E785 Hyperlipidemia, unspecified: Secondary | ICD-10-CM | POA: Insufficient documentation

## 2012-01-09 DIAGNOSIS — I10 Essential (primary) hypertension: Secondary | ICD-10-CM | POA: Insufficient documentation

## 2012-01-09 DIAGNOSIS — I4891 Unspecified atrial fibrillation: Secondary | ICD-10-CM | POA: Insufficient documentation

## 2012-01-09 DIAGNOSIS — R0989 Other specified symptoms and signs involving the circulatory and respiratory systems: Secondary | ICD-10-CM | POA: Insufficient documentation

## 2012-01-09 DIAGNOSIS — I779 Disorder of arteries and arterioles, unspecified: Secondary | ICD-10-CM | POA: Insufficient documentation

## 2012-01-09 DIAGNOSIS — E119 Type 2 diabetes mellitus without complications: Secondary | ICD-10-CM | POA: Insufficient documentation

## 2012-01-09 DIAGNOSIS — I739 Peripheral vascular disease, unspecified: Secondary | ICD-10-CM | POA: Insufficient documentation

## 2012-01-09 DIAGNOSIS — R5383 Other fatigue: Secondary | ICD-10-CM | POA: Insufficient documentation

## 2012-01-09 DIAGNOSIS — R0602 Shortness of breath: Secondary | ICD-10-CM | POA: Insufficient documentation

## 2012-01-09 DIAGNOSIS — R5381 Other malaise: Secondary | ICD-10-CM | POA: Insufficient documentation

## 2012-01-09 DIAGNOSIS — R0600 Dyspnea, unspecified: Secondary | ICD-10-CM

## 2012-01-09 DIAGNOSIS — R079 Chest pain, unspecified: Secondary | ICD-10-CM

## 2012-01-09 DIAGNOSIS — Z87891 Personal history of nicotine dependence: Secondary | ICD-10-CM | POA: Insufficient documentation

## 2012-01-09 MED ORDER — TECHNETIUM TC 99M TETROFOSMIN IV KIT
33.0000 | PACK | Freq: Once | INTRAVENOUS | Status: AC | PRN
  Administered 2012-01-09: 33 via INTRAVENOUS

## 2012-01-09 MED ORDER — TECHNETIUM TC 99M TETROFOSMIN IV KIT
11.0000 | PACK | Freq: Once | INTRAVENOUS | Status: AC | PRN
  Administered 2012-01-09: 11 via INTRAVENOUS

## 2012-01-09 MED ORDER — REGADENOSON 0.4 MG/5ML IV SOLN
0.4000 mg | Freq: Once | INTRAVENOUS | Status: AC
  Administered 2012-01-09: 0.4 mg via INTRAVENOUS

## 2012-01-09 NOTE — Progress Notes (Signed)
[facility] [place] [address] [phone]  Cardiology Nuclear Med Study  Logan White is a 71 y.o. male     MRN : [id number]     DOB: [birth year]  Procedure Date: 01/09/2012  Nuclear Med Background Indication for Stress Test:  Evaluation for Ischemia and Stent Patency History:  '04 Stent-LAD; '10 [id number] infero-apical ischemia, EF=47%, med. tx; '12 Echo:EF=50% Cardiac Risk Factors: Carotid Disease, CVA, History of Smoking, Hypertension, Lipids, NIDDM, PVD and Smoker  Symptoms:  DOE, Fatigue and SOB   Nuclear Pre-Procedure Caffeine/Decaff Intake:  None NPO After: 2:30PM   Lungs:  Clear. O2 Sat: 99% on room air. IV 0.9% NS with Angio Cath:  22g  IV Site: R Hand  IV Started by:  Eliezer Lofts, EMT-P  Chest Size (in):  38 Cup Size: n/a  Height: 6' 3.5" (1.918 m)  Weight:  170 lb (77.111 kg)  BMI:  Body mass index is 20.97 kg/(m^2). Tech Comments:  NA    Nuclear Med Study 1 or 2 day study: 1 day  Stress Test Type:  Lexiscan  Reading MD: Dorris Carnes, MD  Order Authorizing Provider:  Kirk Ruths, MD  Resting Radionuclide: Technetium 54m Tetrofosmin  Resting Radionuclide Dose: 11.0 mCi   Stress Radionuclide:  Technetium 56m Tetrofosmin  Stress Radionuclide Dose: 33.0 mCi           Stress Protocol Rest HR: 117 Stress HR: 112  Rest BP: 112/92 Stress BP: 108/90  Exercise Time (min): n/a METS: n/a   Predicted Max HR: 152 bpm % Max HR: 73.68 bpm Rate Pressure Product: 12096   Dose of Adenosine (mg):  n/a Dose of Lexiscan: 0.4 mg  Dose of Atropine (mg): n/a Dose of Dobutamine: n/a mcg/kg/min (at max HR)  Stress Test Technologist: Letta Moynahan, CMA-N  Nuclear Technologist:  Charlton Amor, CNMT     Rest Procedure:  Myocardial perfusion imaging was performed at rest 45 minutes following the intravenous administration of Technetium 19m Tetrofosmin.  Rest ECG: Atrial Fibrilliation/Flutter with RVR, LVH with Strain and  occasional PVC's.  Stress Procedure:  The patient received IV Lexiscan 0.4 mg over 15-seconds.  Technetium 71m Tetrofosmin injected at 30-seconds.  There were no significant changes with Lexiscan bolus, frequent PVC's and occasional couplets were noted.  Quantitative spect images were obtained after a 45 minute delay.  Stress ECG: Nondiagnostic due to baseline ST changes.  QPS Raw Data Images:  Images were motion corrected.  SOft tissue (diaphragm) underlies the inferior wall. Stress Images:  LV appears dilated.  Thinning in the inferior wall (base, mid, distal).  Small defect in the very distal inferoseptal wall and apex.  Otherwise normal perfusion. Rest Images:  Improvement in the distal inferoseptal region, incomplete at apex. Subtraction (SDS):  Borderline, does not appear significant. Transient Ischemic Dilatation (Normal <1.22):  1.06 Lung/Heart Ratio (Normal <0.45):  0.34  Quantitative Gated Spect Images QGS EDV:  n/a ml QGS ESV:  n/a ml  Impression Exercise Capacity:  Lexiscan with no exercise. BP Response:  Normal blood pressure response. Clinical Symptoms:  Mild chest pain/dyspnea. ECG Impression:  Nondiagnostic Comparison with Prior Nuclear Study: By comparison to previous report does not appear to be significantly different.  Overall Impression:  Distal inferoseptal and small apical defect consistent with scar and/or soft tissue attenuation with minimal periinfarct ischemia.  Overall Low risk scan.  LV Ejection Fraction: Study not gated.  LV Wall Motion:Not gated.  Dorris Carnes

## 2012-01-11 ENCOUNTER — Telehealth: Payer: Self-pay | Admitting: Cardiology

## 2012-01-11 NOTE — Telephone Encounter (Signed)
Please return call to patient regarding stress test results, he can be reached at [phone].

## 2012-01-11 NOTE — Telephone Encounter (Signed)
Spoke with pt, aware of nuclear results 

## 2012-01-20 ENCOUNTER — Ambulatory Visit: Payer: Medicare Other | Admitting: Gastroenterology

## 2012-01-24 ENCOUNTER — Other Ambulatory Visit: Payer: Self-pay | Admitting: *Deleted

## 2012-01-24 DIAGNOSIS — I6529 Occlusion and stenosis of unspecified carotid artery: Secondary | ICD-10-CM

## 2012-02-03 ENCOUNTER — Encounter: Payer: Medicare Other | Admitting: Cardiology

## 2012-02-03 NOTE — Progress Notes (Signed)
HPI: Pleasant male for fu of atrial fibrillation and CAD. Patient with a history of PCI of his LAD in October 2004. His last Myoview was performed in May of 2013. There was distal inferoseptal and small apical defect consistent with scar and/or soft tissue attenuation with minimal periinfarct ischemia. It was felt to be low risk and we are treating medically. He also has cerebrovascular disease. Last echocardiogram performed in April of 2013 showed an ejection fraction of 25%. There was moderate mitral regurgitation. There was severe left atrial enlargement and moderate right atrial enlargement. There was mild to moderate tricuspid regurgitation. Last carotid Doppler performed in May 2012 showed 40-59% bilateral stenosis. Followup was recommended in 12 months. Patient also with paroxysmal atrial fibrillation. TSH normal in April of 2013. When I last saw him it was noted that he needed to be on Coumadin. However he had had a previous admission with hematemesis possibly from a Mallory-Weiss tear. However upper and lower endoscopy had been recommended but not performed. We asked him to see gastroenterology for consideration of those procedures. If negative his aspirin was to be discontinued and Coumadin initiated. I last saw him in April of 2013. Since then,    Current Outpatient Prescriptions  Medication Sig Dispense Refill  . aspirin 81 MG EC tablet Take 81 mg by mouth daily.        Marland Kitchen diltiazem (CARDIZEM CD) 180 MG 24 hr capsule Take 1 capsule (180 mg total) by mouth daily.  30 capsule  6  . furosemide (LASIX) 40 MG tablet Take 1 tablet (40 mg total) by mouth daily.  30 tablet  11  . potassium chloride SA (K-DUR,KLOR-CON) 20 MEQ tablet Take 1 tablet (20 mEq total) by mouth 3 (three) times daily.  90 tablet  5  . simvastatin (ZOCOR) 40 MG tablet Take 1 tablet (40 mg total) by mouth every evening.  30 tablet  11  . DISCONTD: famotidine (PEPCID) 20 MG tablet Take 1 tablet (20 mg total) by mouth 2 (two)  times daily.  60 tablet  11  . DISCONTD: hydrALAZINE (APRESOLINE) 50 MG tablet Take 50 mg by mouth 3 (three) times daily.           Past Medical History  Diagnosis Date  . Inguinal hernia without mention of obstruction or gangrene, unilateral or unspecified, (not specified as recurrent)   . 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 Circulation problem   . Carotid stenosis     dopplers 1/11: 60-79% bilat.  . Hyperlipidemia     mixed  . Hypertension     a. echo 4/12: EF 50%, asymmetric septal hypertrophy, no SAM or LVOT gradient, LAE, PASP 35  . Coronary artery disease     a. s/p aborted ant STEMI tx with Cypher DES to LAD 10/04 (residual at cath: D1 50%, CFX 40% and multiple dist 70%, EF 55%);   b. myoview 3/10: Ef 47%, infero-apical isch, LOW RISK - med Tx recommended  . Glucose intolerance (impaired glucose tolerance)   . CKD (chronic kidney disease)   . Paroxysmal atrial fibrillation 12/2010  . Diverticulosis     2001  . Diabetes mellitus   . H/O: CVA (cardiovascular accident)     Past Surgical History  Procedure Date  . Popliteal artery angioplasty 01/07/07    s/p LEFT POPLITEAL ARTERY EXPLORATION AND VEIN PATCH ANGIOPLASTY, PER DR. EARLY, SECONDARY TO ISCHEMIC LEFT FOOT RELATED TO LEFT POPLITEAL ARTERY INJURY  . Orif tibia & fibula fractures  01/11/07    OPEN TX OF UNICONDYLAR PLATEAU FRACTURE, IRRIGATION/DEBRIDEMENT OF OPEN FRACTURE INCLUDING BONE, REMOVAL OF EXTERNAL FIXATOR UNDER ANESTHESIA PER DR. MICHAEL HANDY    History   Social History  . Marital Status: Married    Spouse Name: N/A    Number of Children: 4  . Years of Education: N/A   Occupational History  . 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 Smoking status: Former Smoker -- 1.0 packs/day    Types: Cigarettes    Quit date: 09/08/2010  . Smokeless tobacco: Never Used  . Alcohol Use: No  . Drug Use: No  . Sexually Active: Not on file   Other Topics  Concern  . 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 1/2 -1 PPD OF CIGARETTES NO ETOH    ROS: no fevers or chills, productive cough, hemoptysis, dysphasia, odynophagia, melena, hematochezia, dysuria, hematuria, rash, seizure activity, orthopnea, PND, pedal edema, claudication. Remaining systems are negative.  Physical Exam: Well-developed well-nourished in no acute distress.  Skin is warm and dry.  HEENT is normal.  Neck is supple. No thyromegaly.  Chest is clear to auscultation with normal expansion.  Cardiovascular exam is regular rate and rhythm.  Abdominal exam nontender or distended. No masses palpated. Extremities show no edema. neuro grossly intact  ECG     This encounter was created in error - please disregard.

## 2012-02-03 NOTE — Assessment & Plan Note (Signed)
Patient remains in atrial fibrillation on examination. Continue Cardizem. Check echocardiogram and TSH. He has embolic risk factors of age greater than 63, coronary disease and hypertension. He also has a history of glucose intolerance. He needs to be on Coumadin. However he did have hematemesis in April of 2012. This was most likely from a Mallory-Weiss tear. However endoscopy and colonoscopy was suggested but he did not have performed as he states he could not afford. Continue aspirin for now. Refer back to GI for above procedures. If negative we will discontinue aspirin and begin Coumadin with goal INR 2-3. He understands risk of CVA and need for Coumadin.

## 2012-02-22 ENCOUNTER — Emergency Department (HOSPITAL_COMMUNITY): Payer: Medicare Other

## 2012-02-22 ENCOUNTER — Inpatient Hospital Stay (HOSPITAL_COMMUNITY)
Admission: EM | Admit: 2012-02-22 | Discharge: 2012-03-16 | DRG: 308 | Disposition: A | Discharge status: Home / Self Care | Payer: Medicare Other | Source: Ambulatory Visit | Attending: Internal Medicine | Admitting: Internal Medicine

## 2012-02-22 DIAGNOSIS — Z8673 Personal history of transient ischemic attack (TIA), and cerebral infarction without residual deficits: Secondary | ICD-10-CM

## 2012-02-22 DIAGNOSIS — I255 Ischemic cardiomyopathy: Secondary | ICD-10-CM | POA: Diagnosis present

## 2012-02-22 DIAGNOSIS — I252 Old myocardial infarction: Secondary | ICD-10-CM

## 2012-02-22 DIAGNOSIS — I4891 Unspecified atrial fibrillation: Secondary | ICD-10-CM

## 2012-02-22 DIAGNOSIS — G931 Anoxic brain damage, not elsewhere classified: Secondary | ICD-10-CM

## 2012-02-22 DIAGNOSIS — E785 Hyperlipidemia, unspecified: Secondary | ICD-10-CM | POA: Diagnosis present

## 2012-02-22 DIAGNOSIS — R410 Disorientation, unspecified: Secondary | ICD-10-CM

## 2012-02-22 DIAGNOSIS — I129 Hypertensive chronic kidney disease with stage 1 through stage 4 chronic kidney disease, or unspecified chronic kidney disease: Secondary | ICD-10-CM | POA: Diagnosis present

## 2012-02-22 DIAGNOSIS — J9600 Acute respiratory failure, unspecified whether with hypoxia or hypercapnia: Secondary | ICD-10-CM | POA: Diagnosis present

## 2012-02-22 DIAGNOSIS — I509 Heart failure, unspecified: Secondary | ICD-10-CM

## 2012-02-22 DIAGNOSIS — E119 Type 2 diabetes mellitus without complications: Secondary | ICD-10-CM

## 2012-02-22 DIAGNOSIS — N179 Acute kidney failure, unspecified: Secondary | ICD-10-CM

## 2012-02-22 DIAGNOSIS — N183 Chronic kidney disease, stage 3 unspecified: Secondary | ICD-10-CM

## 2012-02-22 DIAGNOSIS — R4020 Unspecified coma: Secondary | ICD-10-CM | POA: Diagnosis present

## 2012-02-22 DIAGNOSIS — I2589 Other forms of chronic ischemic heart disease: Secondary | ICD-10-CM | POA: Diagnosis present

## 2012-02-22 DIAGNOSIS — R6521 Severe sepsis with septic shock: Secondary | ICD-10-CM

## 2012-02-22 DIAGNOSIS — E870 Hyperosmolality and hypernatremia: Secondary | ICD-10-CM | POA: Diagnosis present

## 2012-02-22 DIAGNOSIS — F17200 Nicotine dependence, unspecified, uncomplicated: Secondary | ICD-10-CM | POA: Diagnosis present

## 2012-02-22 DIAGNOSIS — I2510 Atherosclerotic heart disease of native coronary artery without angina pectoris: Secondary | ICD-10-CM | POA: Diagnosis present

## 2012-02-22 DIAGNOSIS — E875 Hyperkalemia: Secondary | ICD-10-CM | POA: Diagnosis not present

## 2012-02-22 DIAGNOSIS — J9601 Acute respiratory failure with hypoxia: Secondary | ICD-10-CM | POA: Diagnosis present

## 2012-02-22 DIAGNOSIS — A419 Sepsis, unspecified organism: Secondary | ICD-10-CM

## 2012-02-22 DIAGNOSIS — F1210 Cannabis abuse, uncomplicated: Secondary | ICD-10-CM | POA: Diagnosis present

## 2012-02-22 DIAGNOSIS — N189 Chronic kidney disease, unspecified: Secondary | ICD-10-CM

## 2012-02-22 DIAGNOSIS — Z9861 Coronary angioplasty status: Secondary | ICD-10-CM

## 2012-02-22 DIAGNOSIS — R570 Cardiogenic shock: Secondary | ICD-10-CM

## 2012-02-22 DIAGNOSIS — I5021 Acute systolic (congestive) heart failure: Secondary | ICD-10-CM

## 2012-02-22 DIAGNOSIS — I5023 Acute on chronic systolic (congestive) heart failure: Secondary | ICD-10-CM | POA: Diagnosis not present

## 2012-02-22 DIAGNOSIS — I4901 Ventricular fibrillation: Principal | ICD-10-CM

## 2012-02-22 DIAGNOSIS — E876 Hypokalemia: Secondary | ICD-10-CM | POA: Diagnosis present

## 2012-02-22 DIAGNOSIS — I469 Cardiac arrest, cause unspecified: Secondary | ICD-10-CM

## 2012-02-22 DIAGNOSIS — Z7982 Long term (current) use of aspirin: Secondary | ICD-10-CM

## 2012-02-22 DIAGNOSIS — I6529 Occlusion and stenosis of unspecified carotid artery: Secondary | ICD-10-CM | POA: Diagnosis present

## 2012-02-22 HISTORY — DX: Peripheral vascular disease, unspecified: I73.9

## 2012-02-22 HISTORY — DX: Nonrheumatic mitral (valve) insufficiency: I34.0

## 2012-02-22 HISTORY — DX: Other specified abnormal findings of blood chemistry: R79.89

## 2012-02-22 HISTORY — DX: Ventricular fibrillation: I49.01

## 2012-02-22 HISTORY — DX: Occlusion and stenosis of unspecified carotid artery: I65.29

## 2012-02-22 HISTORY — DX: Hematemesis: K92.0

## 2012-02-22 HISTORY — DX: Unilateral inguinal hernia, without obstruction or gangrene, not specified as recurrent: K40.90

## 2012-02-22 HISTORY — DX: Abnormal electrocardiogram (ECG) (EKG): R94.31

## 2012-02-22 HISTORY — DX: Abnormal results of liver function studies: R94.5

## 2012-02-22 HISTORY — DX: Bradycardia, unspecified: R00.1

## 2012-02-22 LAB — DIFFERENTIAL
Eosinophils Absolute: 0.3 10*3/uL (ref 0.0–0.7)
Eosinophils Relative: 2 % (ref 0–5)
Lymphs Abs: 5.8 10*3/uL — ABNORMAL HIGH (ref 0.7–4.0)
Monocytes Relative: 5 % (ref 3–12)

## 2012-02-22 LAB — CBC
MCH: 25.6 pg — ABNORMAL LOW (ref 26.0–34.0)
MCV: 72.7 fL — ABNORMAL LOW (ref 78.0–100.0)
Platelets: 215 10*3/uL (ref 150–400)
RBC: 5.32 MIL/uL (ref 4.22–5.81)

## 2012-02-22 LAB — COMPREHENSIVE METABOLIC PANEL
BUN: 23 mg/dL (ref 6–23)
Calcium: 8.6 mg/dL (ref 8.4–10.5)
Creatinine, Ser: 1.72 mg/dL — ABNORMAL HIGH (ref 0.50–1.35)
GFR calc Af Amer: 45 mL/min — ABNORMAL LOW (ref >90)
Glucose, Bld: 401 mg/dL — ABNORMAL HIGH (ref 70–99)
Sodium: 138 mEq/L (ref 135–145)
Total Protein: 6.5 g/dL (ref 6.0–8.3)

## 2012-02-22 LAB — ETHANOL: Alcohol, Ethyl (B): <11 mg/dL (ref 0–11)

## 2012-02-22 LAB — POCT I-STAT, CHEM 8
Creatinine, Ser: 1.8 mg/dL — ABNORMAL HIGH (ref 0.50–1.35)
Glucose, Bld: 369 mg/dL — ABNORMAL HIGH (ref 70–99)
Hemoglobin: 15 g/dL (ref 13.0–17.0)
Potassium: 2.2 mEq/L — CL (ref 3.5–5.1)

## 2012-02-22 LAB — APTT: aPTT: 30 seconds (ref 24–37)

## 2012-02-22 MED ORDER — SODIUM CHLORIDE 0.9 % IV SOLN
1.0000 mg/h | INTRAVENOUS | Status: DC
  Administered 2012-02-23: 3 mg/h via INTRAVENOUS
  Administered 2012-02-23: 4 mg/h via INTRAVENOUS
  Administered 2012-02-23: 2 mg/h via INTRAVENOUS
  Administered 2012-02-23 – 2012-02-24 (×2): 3 mg/h via INTRAVENOUS
  Filled 2012-02-22 (×3): qty 10

## 2012-02-22 MED ORDER — METOPROLOL TARTRATE 1 MG/ML IV SOLN
5.0000 mg | Freq: Once | INTRAVENOUS | Status: AC
  Administered 2012-02-22: 5 mg via INTRAVENOUS

## 2012-02-22 MED ORDER — CISATRACURIUM BOLUS VIA INFUSION
0.0500 mg/kg | Freq: Once | INTRAVENOUS | Status: AC | PRN
  Filled 2012-02-22: qty 4

## 2012-02-22 MED ORDER — NALOXONE HCL 1 MG/ML IJ SOLN
INTRAMUSCULAR | Status: AC
  Administered 2012-02-22: 23:00:00
  Filled 2012-02-22: qty 4

## 2012-02-22 MED ORDER — ROCURONIUM BROMIDE 50 MG/5ML IV SOLN
75.0000 mg | Freq: Once | INTRAVENOUS | Status: AC
  Administered 2012-02-22: 75 mg via INTRAVENOUS

## 2012-02-22 MED ORDER — MIDAZOLAM HCL 2 MG/2ML IJ SOLN: 2.0000 mg | Freq: Once | INTRAMUSCULAR | Status: AC | PRN

## 2012-02-22 MED ORDER — NOREPINEPHRINE BITARTRATE 1 MG/ML IJ SOLN
0.5000 ug/min | INTRAVENOUS | Status: DC
  Administered 2012-02-23: 25 ug/min via INTRAVENOUS
  Administered 2012-02-23: 5 ug/min via INTRAVENOUS
  Administered 2012-02-23: 15 ug/min via INTRAVENOUS
  Administered 2012-02-23: 2 ug/min via INTRAVENOUS
  Administered 2012-02-24: 10 ug/min via INTRAVENOUS
  Administered 2012-02-25: 15 ug/min via INTRAVENOUS
  Filled 2012-02-22 (×6): qty 4

## 2012-02-22 MED ORDER — FENTANYL CITRATE 0.05 MG/ML IJ SOLN: 100.0000 ug | Freq: Once | INTRAMUSCULAR | Status: AC | PRN

## 2012-02-22 MED ORDER — EPINEPHRINE HCL 0.1 MG/ML IJ SOLN
INTRAMUSCULAR | Status: AC
  Administered 2012-02-22: 23:00:00
  Filled 2012-02-22: qty 40

## 2012-02-22 MED ORDER — ETOMIDATE 2 MG/ML IV SOLN
20.0000 mg | Freq: Once | INTRAVENOUS | Status: AC
  Administered 2012-02-22: 20 mg via INTRAVENOUS

## 2012-02-22 MED ORDER — SODIUM CHLORIDE 0.9 % IV SOLN
25.0000 ug/h | INTRAVENOUS | Status: DC
  Administered 2012-02-23: 50 ug/h via INTRAVENOUS
  Administered 2012-02-24: 100 ug/h via INTRAVENOUS
  Filled 2012-02-22 (×2): qty 50

## 2012-02-22 MED ORDER — ASPIRIN 300 MG RE SUPP: 300.0000 mg | RECTAL | Status: DC

## 2012-02-22 MED ORDER — MIDAZOLAM HCL 2 MG/2ML IJ SOLN: 2.0000 mg | Freq: Once | INTRAMUSCULAR | Status: DC

## 2012-02-22 MED ORDER — POTASSIUM CHLORIDE 10 MEQ/100ML IV SOLN
10.0000 meq | INTRAVENOUS | Status: DC
  Filled 2012-02-22: qty 100

## 2012-02-22 MED ORDER — SODIUM CHLORIDE 0.9 % IV SOLN
1.0000 ug/kg/min | INTRAVENOUS | Status: DC
  Administered 2012-02-23: 1 ug/kg/min via INTRAVENOUS
  Filled 2012-02-22 (×2): qty 20

## 2012-02-22 MED ORDER — FENTANYL BOLUS VIA INFUSION
50.0000 ug | INTRAVENOUS | Status: DC | PRN
  Filled 2012-02-22: qty 50

## 2012-02-22 MED ORDER — MIDAZOLAM BOLUS VIA INFUSION
2.0000 mg | INTRAVENOUS | Status: DC | PRN
  Filled 2012-02-22: qty 2

## 2012-02-22 MED ORDER — SODIUM CHLORIDE 0.9 % IV SOLN
2000.0000 mL | Freq: Once | INTRAVENOUS | Status: AC
  Administered 2012-02-22: 2000 mL via INTRAVENOUS

## 2012-02-22 MED ORDER — METOPROLOL TARTRATE 1 MG/ML IV SOLN
INTRAVENOUS | Status: AC
  Administered 2012-02-22
  Filled 2012-02-22: qty 5

## 2012-02-22 MED ORDER — CISATRACURIUM BOLUS VIA INFUSION
0.1000 mg/kg | Freq: Once | INTRAVENOUS | Status: AC
  Administered 2012-02-22: 6.5 mg via INTRAVENOUS
  Filled 2012-02-22: qty 7

## 2012-02-22 MED ORDER — FENTANYL CITRATE 0.05 MG/ML IJ SOLN: 100.0000 ug | Freq: Once | INTRAMUSCULAR | Status: DC

## 2012-02-22 MED ORDER — ARTIFICIAL TEARS OP OINT
1.0000 "application " | TOPICAL_OINTMENT | Freq: Three times a day (TID) | OPHTHALMIC | Status: DC
  Administered 2012-02-23 – 2012-02-25 (×7): 1 via OPHTHALMIC
  Filled 2012-02-22 (×2): qty 3.5

## 2012-02-22 MED ORDER — DEXTROSE 50 % IV SOLN
INTRAVENOUS | Status: AC
  Administered 2012-02-22: 23:00:00
  Filled 2012-02-22: qty 50
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
## 2012-02-22 NOTE — Progress Notes (Signed)
Chaplain responded to a CPR in progress. Chaplain prayed with patient's family and offered emotional support. Follow up as needed.

## 2012-02-22 NOTE — ED Notes (Addendum)
Patient arrival with spontaneous pulses, fighting to get up.  Patient was sedated and intubated by Dr Ferdinand Lango and Dr Alvino Chapel at bedside.  King airway is removed by Dr Ferdinand Lango prior to intubation with ETT.
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
## 2012-02-22 NOTE — ED Notes (Signed)
MD in to insert triple lumen right IJ.

## 2012-02-22 NOTE — ED Notes (Signed)
Patient in Afib RVR at this time rate of 122.

## 2012-02-23 ENCOUNTER — Inpatient Hospital Stay (HOSPITAL_COMMUNITY): Payer: Medicare Other

## 2012-02-23 ENCOUNTER — Encounter (HOSPITAL_COMMUNITY): Payer: Self-pay | Admitting: Cardiology

## 2012-02-23 DIAGNOSIS — E1129 Type 2 diabetes mellitus with other diabetic kidney complication: Secondary | ICD-10-CM

## 2012-02-23 DIAGNOSIS — I4901 Ventricular fibrillation: Principal | ICD-10-CM | POA: Diagnosis present

## 2012-02-23 DIAGNOSIS — J9601 Acute respiratory failure with hypoxia: Secondary | ICD-10-CM | POA: Diagnosis present

## 2012-02-23 DIAGNOSIS — E119 Type 2 diabetes mellitus without complications: Secondary | ICD-10-CM | POA: Diagnosis present

## 2012-02-23 DIAGNOSIS — I469 Cardiac arrest, cause unspecified: Secondary | ICD-10-CM | POA: Diagnosis present

## 2012-02-23 DIAGNOSIS — I509 Heart failure, unspecified: Secondary | ICD-10-CM | POA: Diagnosis present

## 2012-02-23 LAB — CARDIAC PANEL(CRET KIN+CKTOT+MB+TROPI)
CK, MB: 6.7 ng/mL (ref 0.3–4.0)
CK, MB: 9 ng/mL (ref 0.3–4.0)
CK, MB: 20.9 ng/mL (ref 0.3–4.0)
Relative Index: 1.9 (ref 0.0–2.5)
Relative Index: 3.5 — ABNORMAL HIGH (ref 0.0–2.5)
Total CK: 345 U/L — ABNORMAL HIGH (ref 7–232)
Total CK: 350 U/L — ABNORMAL HIGH (ref 7–232)
Total CK: 502 U/L — ABNORMAL HIGH (ref 7–232)
Total CK: 602 U/L — ABNORMAL HIGH (ref 7–232)
Troponin I: <0.3 ng/mL (ref <0.30)

## 2012-02-23 LAB — BASIC METABOLIC PANEL
BUN: 20 mg/dL (ref 6–23)
BUN: 23 mg/dL (ref 6–23)
BUN: 24 mg/dL — ABNORMAL HIGH (ref 6–23)
BUN: 24 mg/dL — ABNORMAL HIGH (ref 6–23)
BUN: 25 mg/dL — ABNORMAL HIGH (ref 6–23)
BUN: 26 mg/dL — ABNORMAL HIGH (ref 6–23)
CO2: 20 mEq/L (ref 19–32)
CO2: 18 mEq/L — ABNORMAL LOW (ref 19–32)
Calcium: 8.3 mg/dL — ABNORMAL LOW (ref 8.4–10.5)
Calcium: 7.9 mg/dL — ABNORMAL LOW (ref 8.4–10.5)
Calcium: 8 mg/dL — ABNORMAL LOW (ref 8.4–10.5)
Calcium: 7.7 mg/dL — ABNORMAL LOW (ref 8.4–10.5)
Chloride: 109 mEq/L (ref 96–112)
Chloride: 109 mEq/L (ref 96–112)
Creatinine, Ser: 1.6 mg/dL — ABNORMAL HIGH (ref 0.50–1.35)
Creatinine, Ser: 1.56 mg/dL — ABNORMAL HIGH (ref 0.50–1.35)
Creatinine, Ser: 1.53 mg/dL — ABNORMAL HIGH (ref 0.50–1.35)
Creatinine, Ser: 1.35 mg/dL (ref 0.50–1.35)
Creatinine, Ser: 1.29 mg/dL (ref 0.50–1.35)
GFR calc Af Amer: 47 mL/min — ABNORMAL LOW (ref >90)
GFR calc Af Amer: 49 mL/min — ABNORMAL LOW (ref >90)
GFR calc Af Amer: 52 mL/min — ABNORMAL LOW (ref >90)
GFR calc Af Amer: 61 mL/min — ABNORMAL LOW (ref >90)
GFR calc Af Amer: 71 mL/min — ABNORMAL LOW (ref >90)
GFR calc non Af Amer: 41 mL/min — ABNORMAL LOW (ref >90)
GFR calc non Af Amer: 42 mL/min — ABNORMAL LOW (ref >90)
GFR calc non Af Amer: 43 mL/min — ABNORMAL LOW (ref >90)
GFR calc non Af Amer: 45 mL/min — ABNORMAL LOW (ref >90)
GFR calc non Af Amer: 55 mL/min — ABNORMAL LOW (ref >90)
GFR calc non Af Amer: 62 mL/min — ABNORMAL LOW (ref >90)
Glucose, Bld: 260 mg/dL — ABNORMAL HIGH (ref 70–99)
Glucose, Bld: 155 mg/dL — ABNORMAL HIGH (ref 70–99)
Glucose, Bld: 181 mg/dL — ABNORMAL HIGH (ref 70–99)
Potassium: 2.5 mEq/L — CL (ref 3.5–5.1)
Potassium: 2.8 mEq/L — ABNORMAL LOW (ref 3.5–5.1)
Potassium: 2.9 mEq/L — ABNORMAL LOW (ref 3.5–5.1)
Potassium: 3 mEq/L — ABNORMAL LOW (ref 3.5–5.1)
Sodium: 140 mEq/L (ref 135–145)
Sodium: 139 mEq/L (ref 135–145)
Sodium: 140 mEq/L (ref 135–145)
Sodium: 141 mEq/L (ref 135–145)

## 2012-02-23 LAB — MAGNESIUM: Magnesium: 1.7 mg/dL (ref 1.5–2.5)

## 2012-02-23 LAB — RAPID URINE DRUG SCREEN, HOSP PERFORMED
Amphetamines: NOT DETECTED
Barbiturates: NOT DETECTED
Benzodiazepines: NOT DETECTED
Cocaine: NOT DETECTED
Opiates: POSITIVE — AB

## 2012-02-23 LAB — BLOOD GAS, ARTERIAL
Acid-base deficit: 7.9 mmol/L — ABNORMAL HIGH (ref 0.0–2.0)
Bicarbonate: 19.2 mEq/L — ABNORMAL LOW (ref 20.0–24.0)
Bicarbonate: 20.6 mEq/L (ref 20.0–24.0)
FIO2: 1 %
FIO2: 0.9 %
MECHVT: 500 mL
O2 Saturation: 96.4 %
O2 Saturation: 94.9 %
Patient temperature: 91.4
Patient temperature: 91.4
TCO2: 20.9 mmol/L (ref 0–100)
TCO2: 22.4 mmol/L (ref 0–100)
pO2, Arterial: 79.4 mmHg — ABNORMAL LOW (ref 80.0–100.0)

## 2012-02-23 LAB — GLUCOSE, CAPILLARY
Glucose-Capillary: 140 mg/dL — ABNORMAL HIGH (ref 70–99)
Glucose-Capillary: 188 mg/dL — ABNORMAL HIGH (ref 70–99)
Glucose-Capillary: 197 mg/dL — ABNORMAL HIGH (ref 70–99)
Glucose-Capillary: 346 mg/dL — ABNORMAL HIGH (ref 70–99)

## 2012-02-23 LAB — POCT I-STAT 3, ART BLOOD GAS (G3+)
Acid-base deficit: 9 mmol/L — ABNORMAL HIGH (ref 0.0–2.0)
O2 Saturation: 95 %
Patient temperature: 32.9
TCO2: 19 mmol/L (ref 0–100)

## 2012-02-23 LAB — CBC
Hemoglobin: 15.1 g/dL (ref 13.0–17.0)
Hemoglobin: 15.2 g/dL (ref 13.0–17.0)
MCHC: 36.2 g/dL — ABNORMAL HIGH (ref 30.0–36.0)
MCHC: 35.6 g/dL (ref 30.0–36.0)
Platelets: 224 10*3/uL (ref 150–400)
RDW: 17.6 % — ABNORMAL HIGH (ref 11.5–15.5)

## 2012-02-23 LAB — URINE MICROSCOPIC-ADD ON

## 2012-02-23 LAB — URINALYSIS, ROUTINE W REFLEX MICROSCOPIC
Leukocytes, UA: NEGATIVE
Nitrite: NEGATIVE
Specific Gravity, Urine: 1.015 (ref 1.005–1.030)
pH: 6 (ref 5.0–8.0)

## 2012-02-23 LAB — HEPATIC FUNCTION PANEL
Bilirubin, Direct: 0.2 mg/dL (ref 0.0–0.3)
Indirect Bilirubin: 0.2 mg/dL — ABNORMAL LOW (ref 0.3–0.9)
Total Protein: 6.8 g/dL (ref 6.0–8.3)

## 2012-02-23 LAB — LACTIC ACID, PLASMA
Lactic Acid, Venous: 1.6 mmol/L (ref 0.5–2.2)
Lactic Acid, Venous: 8.7 mmol/L — ABNORMAL HIGH (ref 0.5–2.2)

## 2012-02-23 LAB — PROCALCITONIN: Procalcitonin: <0.1 ng/mL

## 2012-02-23 MED ORDER — POTASSIUM CHLORIDE 10 MEQ/50ML IV SOLN
10.0000 meq | INTRAVENOUS | Status: AC
  Administered 2012-02-23 (×4): 10 meq via INTRAVENOUS
  Filled 2012-02-23: qty 50
  Filled 2012-02-23: qty 150

## 2012-02-23 MED ORDER — MUPIROCIN 2 % EX OINT
1.0000 "application " | TOPICAL_OINTMENT | Freq: Two times a day (BID) | CUTANEOUS | Status: AC
  Administered 2012-02-23 – 2012-02-27 (×10): 1 via NASAL
  Filled 2012-02-23: qty 22

## 2012-02-23 MED ORDER — SODIUM BICARBONATE 8.4 % IV SOLN
INTRAVENOUS | Status: DC
  Administered 2012-02-23 – 2012-02-24 (×3): via INTRAVENOUS
  Filled 2012-02-23 (×10): qty 150

## 2012-02-23 MED ORDER — SODIUM CHLORIDE 0.9 % IV SOLN
INTRAVENOUS | Status: AC
  Administered 2012-02-23: 01:00:00 via INTRAVENOUS

## 2012-02-23 MED ORDER — ETOMIDATE 2 MG/ML IV SOLN
INTRAVENOUS | Status: AC
  Administered 2012-02-22
  Filled 2012-02-23: qty 20

## 2012-02-23 MED ORDER — LIDOCAINE HCL (CARDIAC) 20 MG/ML IV SOLN
INTRAVENOUS | Status: AC
  Administered 2012-02-22
  Filled 2012-02-23: qty 5

## 2012-02-23 MED ORDER — IPRATROPIUM-ALBUTEROL 18-103 MCG/ACT IN AERO
6.0000 | INHALATION_SPRAY | RESPIRATORY_TRACT | Status: DC
  Administered 2012-02-23 (×3): 6 via RESPIRATORY_TRACT
  Filled 2012-02-23: qty 14.7

## 2012-02-23 MED ORDER — DEXTROSE 10 % IV SOLN
INTRAVENOUS | Status: DC | PRN
  Administered 2012-02-27: 05:00:00 via INTRAVENOUS

## 2012-02-23 MED ORDER — CHLORHEXIDINE GLUCONATE 0.12 % MT SOLN: 15.0000 mL | Freq: Two times a day (BID) | OROMUCOSAL | Status: DC

## 2012-02-23 MED ORDER — POTASSIUM CHLORIDE 20 MEQ/15ML (10%) PO LIQD
40.0000 meq | Freq: Once | ORAL | Status: AC
  Administered 2012-02-23: 40 meq
  Filled 2012-02-23: qty 30

## 2012-02-23 MED ORDER — FAMOTIDINE IN NACL 20-0.9 MG/50ML-% IV SOLN
20.0000 mg | Freq: Two times a day (BID) | INTRAVENOUS | Status: DC
  Administered 2012-02-23 – 2012-02-28 (×13): 20 mg via INTRAVENOUS
  Filled 2012-02-23 (×16): qty 50

## 2012-02-23 MED ORDER — INSULIN ASPART 100 UNIT/ML ~~LOC~~ SOLN
0.0000 [IU] | SUBCUTANEOUS | Status: DC
  Administered 2012-02-23 (×3): 3 [IU] via SUBCUTANEOUS
  Administered 2012-02-23: 4 [IU] via SUBCUTANEOUS
  Administered 2012-02-24 – 2012-03-04 (×17): 1 [IU] via SUBCUTANEOUS

## 2012-02-23 MED ORDER — SODIUM BICARBONATE 8.4 % IV SOLN
50.0000 meq | Freq: Once | INTRAVENOUS | Status: AC
  Administered 2012-02-23: 50 meq via INTRAVENOUS
  Filled 2012-02-23: qty 50

## 2012-02-23 MED ORDER — BIOTENE DRY MOUTH MT LIQD
15.0000 mL | Freq: Four times a day (QID) | OROMUCOSAL | Status: DC
  Administered 2012-02-23 – 2012-03-05 (×45): 15 mL via OROMUCOSAL

## 2012-02-23 MED ORDER — ASPIRIN 81 MG PO CHEW
324.0000 mg | CHEWABLE_TABLET | ORAL | Status: AC
  Administered 2012-02-23: 324 mg via ORAL

## 2012-02-23 MED ORDER — METOPROLOL TARTRATE 1 MG/ML IV SOLN
5.0000 mg | Freq: Three times a day (TID) | INTRAVENOUS | Status: DC
  Administered 2012-02-23: 5 mg via INTRAVENOUS
  Filled 2012-02-23 (×3): qty 5

## 2012-02-23 MED ORDER — POTASSIUM CHLORIDE 10 MEQ/50ML IV SOLN
10.0000 meq | INTRAVENOUS | Status: AC
  Administered 2012-02-23 (×4): 10 meq via INTRAVENOUS

## 2012-02-23 MED ORDER — DILTIAZEM HCL ER COATED BEADS 180 MG PO CP24: 180.0000 mg | ORAL_CAPSULE | Freq: Every day | ORAL | Status: DC

## 2012-02-23 MED ORDER — ENOXAPARIN SODIUM 40 MG/0.4ML ~~LOC~~ SOLN
40.0000 mg | SUBCUTANEOUS | Status: DC
  Administered 2012-02-23 – 2012-03-11 (×17): 40 mg via SUBCUTANEOUS
  Filled 2012-02-23 (×20): qty 0.4

## 2012-02-23 MED ORDER — SUCCINYLCHOLINE CHLORIDE 20 MG/ML IJ SOLN
INTRAMUSCULAR | Status: AC
  Filled 2012-02-23: qty 10

## 2012-02-23 MED ORDER — ROCURONIUM BROMIDE 50 MG/5ML IV SOLN
INTRAVENOUS | Status: AC
  Administered 2012-02-22
  Filled 2012-02-23: qty 2

## 2012-02-23 MED ORDER — ASPIRIN 81 MG PO CHEW
CHEWABLE_TABLET | ORAL | Status: AC
  Administered 2012-02-23: 01:00:00
  Filled 2012-02-23: qty 4

## 2012-02-23 MED ORDER — BIOTENE DRY MOUTH MT LIQD: 15.0000 mL | Freq: Two times a day (BID) | OROMUCOSAL | Status: DC

## 2012-02-23 MED ORDER — SODIUM BICARBONATE 8.4 % IV SOLN
INTRAVENOUS | Status: DC
  Administered 2012-02-23: 11:00:00 via INTRAVENOUS
  Filled 2012-02-23 (×2): qty 50

## 2012-02-23 MED ORDER — IPRATROPIUM-ALBUTEROL 18-103 MCG/ACT IN AERO
6.0000 | INHALATION_SPRAY | RESPIRATORY_TRACT | Status: DC
  Administered 2012-02-23 – 2012-03-05 (×64): 6 via RESPIRATORY_TRACT
  Filled 2012-02-23 (×2): qty 14.7

## 2012-02-23 MED ORDER — SODIUM CHLORIDE 0.9 % IV SOLN: 250.0000 mL | INTRAVENOUS | Status: DC | PRN

## 2012-02-23 MED ORDER — PNEUMOCOCCAL VAC POLYVALENT 25 MCG/0.5ML IJ INJ
0.5000 mL | INJECTION | INTRAMUSCULAR | Status: AC
  Filled 2012-02-23: qty 0.5

## 2012-02-23 MED ORDER — SODIUM CHLORIDE 0.9 % IV SOLN
2.0000 g | Freq: Once | INTRAVENOUS | Status: AC
  Administered 2012-02-23: 2 g via INTRAVENOUS
  Filled 2012-02-23: qty 20

## 2012-02-23 MED ORDER — SODIUM CHLORIDE 0.9 % IV SOLN
INTRAVENOUS | Status: DC
  Administered 2012-02-23: 03:00:00 via INTRAVENOUS
  Administered 2012-02-25: 20 mL/h via INTRAVENOUS
  Administered 2012-03-02: 10 mL/h via INTRAVENOUS
  Administered 2012-03-04 (×2): via INTRAVENOUS

## 2012-02-23 MED ORDER — ATROPINE SULFATE 1 MG/ML IJ SOLN
1.0000 mg | Freq: Once | INTRAMUSCULAR | Status: AC
  Administered 2012-02-23: 1 mg via INTRAVENOUS

## 2012-02-23 MED ORDER — CHLORHEXIDINE GLUCONATE CLOTH 2 % EX PADS
6.0000 | MEDICATED_PAD | Freq: Every day | CUTANEOUS | Status: AC
  Administered 2012-02-23 – 2012-02-27 (×5): 6 via TOPICAL

## 2012-02-23 MED ORDER — ASPIRIN 300 MG RE SUPP: 300.0000 mg | RECTAL | Status: AC

## 2012-02-23 MED ORDER — CHLORHEXIDINE GLUCONATE 0.12 % MT SOLN
15.0000 mL | Freq: Two times a day (BID) | OROMUCOSAL | Status: DC
  Administered 2012-02-23 – 2012-03-05 (×23): 15 mL via OROMUCOSAL
  Filled 2012-02-23 (×19): qty 15

## 2012-02-23 MED ORDER — POTASSIUM CHLORIDE 10 MEQ/50ML IV SOLN
10.0000 meq | INTRAVENOUS | Status: AC
  Administered 2012-02-23 (×3): 10 meq via INTRAVENOUS

## 2012-02-23 MED ORDER — SODIUM BICARBONATE 8.4 % IV SOLN
INTRAVENOUS | Status: DC
  Administered 2012-02-23: 09:00:00 via INTRAVENOUS
  Filled 2012-02-23 (×3): qty 150

## 2012-02-23 MED FILL — Medication: Qty: 1 | Status: AC
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
## 2012-02-23 NOTE — Procedures (Signed)
Central Venous Catheter Insertion Procedure Note Jontue Jeannette [id number] Dec 12, 1940  Procedure: Insertion of Central Venous Catheter Indications: Assessment of intravascular volume and Drug and/or fluid administration  Procedure Details Consent: Unable to obtain consent because of emergent medical necessity. Time Out: Verified patient identification, verified procedure, site/side was marked, verified correct patient position, special equipment/implants available, medications/allergies/relevent history reviewed, required imaging and test results available.  Performed  Maximum sterile technique was used including antiseptics, cap, gloves, gown, hand hygiene, mask and sheet. Skin prep: Chlorhexidine; local anesthetic administered A antimicrobial bonded/coated triple lumen catheter was placed in the right internal jugular vein using the Seldinger technique. Ultrasound used for vessel identification.  Evaluation Blood flow good Complications: No apparent complications Patient did tolerate procedure well. Chest X-ray ordered to verify placement.  CXR: normal.  Jaquise Faux 02/23/2012, 12:02 AM

## 2012-02-23 NOTE — Progress Notes (Signed)
[facility] Progress Note Patient Name: Deveion Bartles DOB: [birth year] MRN: [id number]  Date of Service  02/23/2012   HPI/Events of Note   K 2.9  ` K supp given   Intervention Category Major Interventions: Electrolyte abnormality - evaluation and management  Asencion Noble 02/23/2012, 4:58 PM

## 2012-02-23 NOTE — ED Provider Notes (Signed)
I saw and evaluated the patient, reviewed the resident's note and I agree with the findings and plan and agree with their ECG interpretation. Patient came in post cardiac arrest. Initially V. fib. Return of vitals after at most 10 minutes of down time. Found to be hypokalemic with a potassium of 2.2. Patient's King airway was changed over to an ET tube by Dr. Ferdinand Lango. I was present for the whole procedure. Patient was hypothermia protocol patient. I patient was seen in the ER by critical care and by Dr. [name].  Jasper Riling. Alvino Chapel, MD 02/23/12 1500

## 2012-02-23 NOTE — Progress Notes (Signed)
INITIAL ADULT NUTRITION ASSESSMENT Date: 02/23/2012   Time: 2:47 PM Reason for Assessment: Vent x 1 day  ASSESSMENT: Male 71 y.o.  Dx: Cardiac arrest  Hx: No past medical history on file. Per H&P known CAD  Related Meds:     . sodium chloride  2,000 mL Intravenous Once  . albuterol-ipratropium  6-8 puff Inhalation Q4H  . antiseptic oral rinse  15 mL Mouth Rinse QID  . artificial tears  1 application Both Eyes [id number]  . aspirin      . aspirin  324 mg Oral NOW   Or  . aspirin  300 mg Rectal NOW  . atropine  1 mg Intravenous Once  . calcium gluconate  2 g Intravenous Once  . chlorhexidine  15 mL Mouth Rinse BID  . Chlorhexidine Gluconate Cloth  6 each Topical Q0600  . dextrose      . enoxaparin  40 mg Subcutaneous Q24H  . EPINEPHrine      . etomidate      . etomidate  20 mg Intravenous Once  . famotidine (PEPCID) IV  20 mg Intravenous Q12H  . fentaNYL  100 mcg Intravenous Once  . insulin aspart  0-4 Units Subcutaneous Q4H  . lidocaine (cardiac) 100 mg/42ml      . metoprolol      . metoprolol  5 mg Intravenous Once  . mupirocin ointment  1 application Nasal BID  . naloxone      . potassium chloride  10 mEq Intravenous Q1 Hr x 4  . potassium chloride  10 mEq Intravenous Q1 Hr x 4  . potassium chloride  40 mEq Per Tube Once  . rocuronium      . rocuronium  75 mg Intravenous Once  . sodium bicarbonate  50 mEq Intravenous Once  . succinylcholine      . DISCONTD: antiseptic oral rinse  15 mL Mouth Rinse q12n4p  . DISCONTD: aspirin  300 mg Rectal NOW  . DISCONTD: chlorhexidine  15 mL Mouth Rinse BID  . DISCONTD: diltiazem  180 mg Oral Daily  . DISCONTD: metoprolol  5 mg Intravenous Q8H  . DISCONTD: midazolam  2 mg Intravenous Once  . DISCONTD: potassium chloride  10 mEq Intravenous Q1 Hr x 4     Ht: 6' (182.9 cm)  Wt: 171 lb 1.2 oz (77.6 kg)  Ideal Wt: 81 kg % Ideal Wt: 96%  Usual Wt:  Wt Readings from Last 10 Encounters:  02/23/12 171 lb 1.2 oz (77.6 kg)    %  Usual Wt: --  Body mass index is 23.20 kg/(m^2). WNL  Food/Nutrition Related Hx: pt unable to provide information at this time   Labs:  CMP     Component Value Date/Time   NA 138 02/23/2012 1100   K 3.8 02/23/2012 1100   CL 108 02/23/2012 1100   CO2 17* 02/23/2012 1100   GLUCOSE 238* 02/23/2012 1100   BUN 26* 02/23/2012 1100   CREATININE 1.53* 02/23/2012 1100   CALCIUM 7.8* 02/23/2012 1100   PROT 6.8 02/23/2012 0500   ALBUMIN 3.4* 02/23/2012 0500   AST 200* 02/23/2012 0500   ALT 124* 02/23/2012 0500   ALKPHOS 176* 02/23/2012 0500   BILITOT 0.4 02/23/2012 0500   GFRNONAA 45* 02/23/2012 1100   GFRAA 52* 02/23/2012 1100   CBG (last 3)   Basename 02/23/12 1013 02/23/12 0820 02/23/12 0643  GLUCAP 203* 115* 140*   Magnesium  Date/Time Value Range Status  02/23/2012 12:35 AM 1.7  1.5 -  2.5 mg/dL Final   No results found for this basename: phos     Intake/Output Summary (Last 24 hours) at 02/23/12 1449 Last data filed at 02/23/12 1400  Gross per 24 hour  Intake 3872.66 ml  Output   1440 ml  Net 2432.66 ml     Diet Order:  none  Supplements/Tube Feeding: none  IVF:    sodium chloride Last Rate: 100 mL/hr at 02/23/12 0115  sodium chloride Last Rate: 20 mL/hr at 02/23/12 0230  cisatracurium (NIMBEX) infusion Last Rate: 1 mcg/kg/min (02/23/12 0119)  dextrose   fentaNYL infusion INTRAVENOUS Last Rate: 100 mcg/hr (02/23/12 0600)  midazolam (VERSED) infusion Last Rate: 3 mg/hr (02/23/12 1006)  norepinephrine (LEVOPHED) Adult infusion Last Rate: 15 mcg/min (02/23/12 1436)  sodium bicarbonate infusion 1000 mL Last Rate: 150 mL/hr at 02/23/12 1400  DISCONTD:  sodium bicarbonate infusion 1000 mL Last Rate: 200 mL/hr at 02/23/12 [id number]  DISCONTD:  sodium bicarbonate infusion 1000 mL Last Rate: 150 mL/hr at 02/23/12 1059    Estimated Nutritional Needs:   Kcal: 1802 Protein: 120-145 gm  Fluid: 1.8 L  Pt is intubated and sedated after witnessed arrest, now on arctic sun protocol. Pt was  + for opiates and THC on admission.  Recommend initiation of EN once pt rewarmed and if to remain intubated.   NUTRITION DIAGNOSIS: -Inadequate oral intake (NI-2.1).  Status: Ongoing  RELATED TO: inability to eat  AS EVIDENCE BY: no diet, vent dependant  MONITORING/EVALUATION(Goals): Goal: initiation of EN w/i 24-48 hours once pt is rewarmed if remains intubated.  And  EN to meet >90% of estimated nutrition needs.  Monitor: vent/rewarming, weight, labs, I/O's, TF initiation   EDUCATION NEEDS: -No education needs identified at this time  INTERVENTION: 1. Recommend if TF initiated, Osmolite 1.2 @ goal rate of 50 ml/hr, with 30 ml Pro-stat 4 times daily via tube.   This will provide 1840 kcal, 127 gm protein, and 984 ml free water.  2. RD will continue to follow.    Dietitian [phone]  DOCUMENTATION CODES Per approved criteria  -Not Applicable    Logan White 02/23/2012, 2:47 PM
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
## 2012-02-23 NOTE — Progress Notes (Signed)
Dr. Nelda Marseille notified of pt's ABG result and HR 20's and B/P decrease. Orders received and Richardson Landry Minor NP at bedside. Titrating Levophed, 1 amp Na Bicarb  and 1 amp of atropine given. Na Bicarb infusion started per orders, vent changes made as ordered.  HR and B/P Increasing after meds given. Will continue to monitor pt.

## 2012-02-23 NOTE — Progress Notes (Signed)
ABG drawn on increased VT
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
## 2012-02-24 ENCOUNTER — Inpatient Hospital Stay (HOSPITAL_COMMUNITY): Payer: Medicare Other

## 2012-02-24 DIAGNOSIS — I059 Rheumatic mitral valve disease, unspecified: Secondary | ICD-10-CM

## 2012-02-24 LAB — BASIC METABOLIC PANEL
BUN: 19 mg/dL (ref 6–23)
BUN: 20 mg/dL (ref 6–23)
CO2: 30 mEq/L (ref 19–32)
CO2: 27 mEq/L (ref 19–32)
Calcium: 7.5 mg/dL — ABNORMAL LOW (ref 8.4–10.5)
Calcium: 7.6 mg/dL — ABNORMAL LOW (ref 8.4–10.5)
Creatinine, Ser: 1.14 mg/dL (ref 0.50–1.35)
Creatinine, Ser: 1.27 mg/dL (ref 0.50–1.35)
GFR calc Af Amer: 71 mL/min — ABNORMAL LOW (ref >90)
GFR calc non Af Amer: 64 mL/min — ABNORMAL LOW (ref >90)
GFR calc non Af Amer: 61 mL/min — ABNORMAL LOW (ref >90)
Glucose, Bld: 148 mg/dL — ABNORMAL HIGH (ref 70–99)
Glucose, Bld: 92 mg/dL (ref 70–99)
Glucose, Bld: 132 mg/dL — ABNORMAL HIGH (ref 70–99)
Potassium: 2.9 mEq/L — ABNORMAL LOW (ref 3.5–5.1)
Sodium: 141 mEq/L (ref 135–145)
Sodium: 139 mEq/L (ref 135–145)

## 2012-02-24 LAB — GLUCOSE, CAPILLARY
Glucose-Capillary: 80 mg/dL (ref 70–99)
Glucose-Capillary: 69 mg/dL — ABNORMAL LOW (ref 70–99)
Glucose-Capillary: 83 mg/dL (ref 70–99)
Glucose-Capillary: 117 mg/dL — ABNORMAL HIGH (ref 70–99)

## 2012-02-24 LAB — CORTISOL: Cortisol, Plasma: 25.2 ug/dL

## 2012-02-24 LAB — BLOOD GAS, ARTERIAL
Acid-Base Excess: 1.3 mmol/L (ref 0.0–2.0)
Drawn by: 36277
FIO2: 0.4 %
RATE: 22 resp/min
pCO2 arterial: 30.8 mmHg — ABNORMAL LOW (ref 35.0–45.0)
pO2, Arterial: 74.5 mmHg — ABNORMAL LOW (ref 80.0–100.0)

## 2012-02-24 LAB — URINE CULTURE: Culture  Setup Time: 201306200648

## 2012-02-24 LAB — CBC
MCV: 70.6 fL — ABNORMAL LOW (ref 78.0–100.0)
Platelets: 164 10*3/uL (ref 150–400)
RDW: 17.4 % — ABNORMAL HIGH (ref 11.5–15.5)
WBC: 5.6 10*3/uL (ref 4.0–10.5)

## 2012-02-24 LAB — PHOSPHORUS: Phosphorus: 3 mg/dL (ref 2.3–4.6)

## 2012-02-24 LAB — MAGNESIUM: Magnesium: 1.3 mg/dL — ABNORMAL LOW (ref 1.5–2.5)

## 2012-02-24 MED ORDER — DEXTROSE 50 % IV SOLN
25.0000 mL | Freq: Once | INTRAVENOUS | Status: AC
  Administered 2012-02-24: 13:00:00 via INTRAVENOUS

## 2012-02-24 MED ORDER — POTASSIUM CHLORIDE 20 MEQ/15ML (10%) PO LIQD
20.0000 meq | Freq: Once | ORAL | Status: AC
  Administered 2012-02-24: 20 meq
  Filled 2012-02-24: qty 15

## 2012-02-24 MED ORDER — PNEUMOCOCCAL VAC POLYVALENT 25 MCG/0.5ML IJ INJ
0.5000 mL | INJECTION | INTRAMUSCULAR | Status: DC
  Filled 2012-02-24: qty 0.5

## 2012-02-24 MED ORDER — DEXTROSE 5 % IV SOLN
2.0000 g | Freq: Once | INTRAVENOUS | Status: DC
  Filled 2012-02-24: qty 4

## 2012-02-24 MED ORDER — POTASSIUM CHLORIDE 20 MEQ/15ML (10%) PO LIQD
40.0000 meq | Freq: Once | ORAL | Status: AC
  Administered 2012-02-24: 40 meq via ORAL
  Filled 2012-02-24: qty 30

## 2012-02-24 MED ORDER — POTASSIUM CHLORIDE 20 MEQ/15ML (10%) PO LIQD
ORAL | Status: AC
  Administered 2012-02-24: 40 meq via ORAL
  Filled 2012-02-24: qty 30

## 2012-02-24 MED ORDER — DEXTROSE 50 % IV SOLN
INTRAVENOUS | Status: AC
  Filled 2012-02-24: qty 50

## 2012-02-24 MED ORDER — DEXTROSE 50 % IV SOLN
25.0000 mL | Freq: Once | INTRAVENOUS | Status: AC
  Administered 2012-02-24: 25 mL via INTRAVENOUS
  Filled 2012-02-24: qty 50

## 2012-02-24 MED ORDER — POTASSIUM CHLORIDE 10 MEQ/50ML IV SOLN
INTRAVENOUS | Status: AC
  Administered 2012-02-24: 10 meq
  Filled 2012-02-24: qty 50

## 2012-02-24 MED ORDER — SODIUM CHLORIDE 0.9 % IV SOLN
2.0000 mg/h | INTRAVENOUS | Status: DC
  Filled 2012-02-24: qty 10

## 2012-02-24 MED ORDER — POTASSIUM CHLORIDE 10 MEQ/100ML IV SOLN
10.0000 meq | Freq: Once | INTRAVENOUS | Status: AC
  Administered 2012-02-24: 10 meq via INTRAVENOUS

## 2012-02-24 MED ORDER — SODIUM CHLORIDE 0.9 % IV SOLN
50.0000 ug/h | INTRAVENOUS | Status: DC
  Administered 2012-02-25: 50 ug/h via INTRAVENOUS
  Filled 2012-02-24: qty 50

## 2012-02-24 MED ORDER — FENTANYL BOLUS VIA INFUSION
50.0000 ug | Freq: Four times a day (QID) | INTRAVENOUS | Status: DC | PRN
  Filled 2012-02-24: qty 100

## 2012-02-24 MED ORDER — POTASSIUM CHLORIDE 20 MEQ/15ML (10%) PO LIQD
ORAL | Status: AC
  Filled 2012-02-24: qty 30

## 2012-02-24 MED ORDER — POTASSIUM CHLORIDE 20 MEQ/15ML (10%) PO LIQD
40.0000 meq | Freq: Once | ORAL | Status: AC
  Administered 2012-02-24: 40 meq
  Filled 2012-02-24: qty 30

## 2012-02-24 MED ORDER — MAGNESIUM SULFATE 40 MG/ML IJ SOLN
2.0000 g | Freq: Once | INTRAMUSCULAR | Status: AC
  Administered 2012-02-24: 2 g via INTRAVENOUS
  Filled 2012-02-24: qty 50

## 2012-02-24 MED ORDER — PNEUMOCOCCAL VAC POLYVALENT 25 MCG/0.5ML IJ INJ
0.5000 mL | INJECTION | INTRAMUSCULAR | Status: AC
  Administered 2012-02-25: 0.5 mL via INTRAMUSCULAR
  Filled 2012-02-24: qty 0.5

## 2012-02-24 MED ORDER — DEXTROSE 10 % IV SOLN
INTRAVENOUS | Status: DC
  Administered 2012-02-24: 17:00:00 via INTRAVENOUS

## 2012-02-24 MED ORDER — MIDAZOLAM BOLUS VIA INFUSION
1.0000 mg | INTRAVENOUS | Status: DC | PRN
  Filled 2012-02-24: qty 2

## 2012-02-24 MED FILL — Perflutren Lipid Microsphere IV Susp 6.52 MG/ML: INTRAVENOUS | Qty: 2 | Status: AC

## 2012-02-24 NOTE — Progress Notes (Signed)
CRITICAL VALUE ALERT  Critical value received: Potassium 2.6 Date of notification: 02/24/12 Time of notification:  0610  Critical value read back: yes  Nurse who received alert: Geralynn Rile MD notified (1st page): [phone] Time of first page:  MD notified (2nd page):  Time of second page:  Responding MD:  Time MD responded:

## 2012-02-24 NOTE — Progress Notes (Signed)
[place] Progress Note Patient Name: Logan White DOB: 21-Oct-1940 MRN: [id number]  Date of Service  02/24/2012   HPI/Events of Note   RN says map goal > 65. Currently MAP is 63 with sbp 80. Ptient on levophed but is capped at 26mcg  eICU Interventions  Increase levophed range to 46mcg   Intervention Category Intermediate Interventions: Medication change / dose adjustment  Baer Hinton 02/24/2012, 9:59 PM

## 2012-02-24 NOTE — Progress Notes (Signed)
CBG: 68  Treatment: dextrose 50% half an amp   Symptoms: none   Follow-up CBG: Time:1300 CBG Result:140  Possible Reasons for Event: removal of dextrose infusion   Comments/MD notified: no     Logan White
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
## 2012-02-24 NOTE — Consult Note (Signed)
Pt currently ventilated. Will revisit later.

## 2012-02-24 NOTE — Progress Notes (Signed)
Pt BP has steadily been decreasing since start of shift; levo has remained at 74mcg/min; MD notified that MAP was dropping below 65; orders received to increase levophed; MD also aware of pt's CVP and decreased urine output. will continue to monitor closely and update as needed

## 2012-02-24 NOTE — Progress Notes (Signed)
[place] Progress Note Patient Name: Logan White DOB: 01-12-1941 MRN: [id number]  Date of Service  02/24/2012   HPI/Events of Note   Lab 02/24/12 0455 02/23/12 2251 02/23/12 1850 02/23/12 1500 02/23/12 1100 02/23/12 0035  NA 141 141 140 140 138 --  K 2.6* 2.8* -- -- -- --  CL 101 105 105 108 108 --  CO2 30 23 21 20  17* --  GLUCOSE 148* 144* 181* 212* 238* --  BUN 19 20 22  24* 26* --  CREATININE 1.14 1.18 1.29 1.35 1.53* --  CALCIUM 7.5* 7.7* 8.0* 7.9* 7.8* --  MG 1.3* -- -- -- -- 1.7  PHOS 3.0 -- -- -- -- --   Rewarming in progress   eICU Interventions  Will give mag 2gm Will give 82meq kcl   Intervention Category Major Interventions: Electrolyte abnormality - evaluation and management  [name] 02/24/2012, 6:28 AM
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
## 2012-02-24 NOTE — Progress Notes (Signed)
Wasted 100cc fentanyl in bag and also wasted 20cc versed from bag.    wittnessed by pam alford

## 2012-02-24 NOTE — Progress Notes (Signed)
Pt preparing for wean for today.  rn has turned off pressors and all criteria is met.

## 2012-02-24 NOTE — Progress Notes (Signed)
eLink Physician-Brief Progress Note Patient Name: Logan White DOB: 08-08-1941 MRN: [id number]  Date of Service  02/24/2012   HPI/Events of Note   Lab 02/23/12 2251 02/23/12 1850 02/23/12 1500 02/23/12 1100 02/23/12 0658  K 2.8* 2.6* 2.9* 3.8 2.8*      eICU Interventions  Replete with 13meq kcl via tube Avoid giving any more k due to anticipated rewarming in 2h Check mag and phos   Intervention Category Intermediate Interventions: Electrolyte abnormality - evaluation and management  Yeily Link 02/24/2012, 12:04 AM

## 2012-02-24 NOTE — Progress Notes (Signed)
*  PRELIMINARY RESULTS* Echocardiogram 2D Echocardiogram withDefinity has been performed.  Logan White 02/24/2012, 12:44 PM

## 2012-02-24 NOTE — Consult Note (Deleted)
Pt says he smokes 4-5 black & mild cigars per year for stress. His wife commented about him telling the truth but pt insists he smokes only 4 per year. Pt voices understanding of risk factors and says he plans to quit cold [place]. Encouraged pt to quit completely. Referred to 1-800 quit now for f/u and support. Discussed oral fixation substitutes, second hand smoke and in home smoking policy. Reviewed and gave pt Written education/contact information.

## 2012-02-25 ENCOUNTER — Inpatient Hospital Stay (HOSPITAL_COMMUNITY): Payer: Medicare Other

## 2012-02-25 DIAGNOSIS — R570 Cardiogenic shock: Secondary | ICD-10-CM | POA: Diagnosis present

## 2012-02-25 DIAGNOSIS — I255 Ischemic cardiomyopathy: Secondary | ICD-10-CM | POA: Diagnosis present

## 2012-02-25 DIAGNOSIS — N179 Acute kidney failure, unspecified: Secondary | ICD-10-CM | POA: Diagnosis not present

## 2012-02-25 DIAGNOSIS — G931 Anoxic brain damage, not elsewhere classified: Secondary | ICD-10-CM | POA: Diagnosis present

## 2012-02-25 LAB — URINALYSIS, ROUTINE W REFLEX MICROSCOPIC
Glucose, UA: NEGATIVE mg/dL
Ketones, ur: NEGATIVE mg/dL
Protein, ur: >300 mg/dL — AB
Urobilinogen, UA: 1 mg/dL (ref 0.0–1.0)

## 2012-02-25 LAB — CBC
HCT: 38.4 % — ABNORMAL LOW (ref 39.0–52.0)
MCH: 25.6 pg — ABNORMAL LOW (ref 26.0–34.0)
MCHC: 35.9 g/dL (ref 30.0–36.0)
MCV: 71.1 fL — ABNORMAL LOW (ref 78.0–100.0)
RDW: 18 % — ABNORMAL HIGH (ref 11.5–15.5)

## 2012-02-25 LAB — GLUCOSE, CAPILLARY
Glucose-Capillary: 120 mg/dL — ABNORMAL HIGH (ref 70–99)
Glucose-Capillary: 123 mg/dL — ABNORMAL HIGH (ref 70–99)
Glucose-Capillary: 128 mg/dL — ABNORMAL HIGH (ref 70–99)
Glucose-Capillary: 122 mg/dL — ABNORMAL HIGH (ref 70–99)
Glucose-Capillary: 130 mg/dL — ABNORMAL HIGH (ref 70–99)
Glucose-Capillary: 107 mg/dL — ABNORMAL HIGH (ref 70–99)

## 2012-02-25 LAB — PHOSPHORUS: Phosphorus: 4.6 mg/dL (ref 2.3–4.6)

## 2012-02-25 LAB — BLOOD GAS, ARTERIAL
Drawn by: 36277
MECHVT: 620 mL
PEEP: 5 cmH2O
Patient temperature: 98.6
RATE: 16 resp/min
pCO2 arterial: 43 mmHg (ref 35.0–45.0)
pH, Arterial: 7.392 (ref 7.350–7.450)

## 2012-02-25 LAB — BASIC METABOLIC PANEL
GFR calc non Af Amer: 40 mL/min — ABNORMAL LOW (ref >90)
Glucose, Bld: 113 mg/dL — ABNORMAL HIGH (ref 70–99)
Potassium: 5.5 mEq/L — ABNORMAL HIGH (ref 3.5–5.1)
Sodium: 135 mEq/L (ref 135–145)

## 2012-02-25 LAB — URINE MICROSCOPIC-ADD ON

## 2012-02-25 MED ORDER — SODIUM CHLORIDE 0.9 % IV SOLN
300.0000 ug/h | INTRAVENOUS | Status: DC
  Administered 2012-02-25: 150 ug/h via INTRAVENOUS
  Administered 2012-02-26: 125 ug/h via INTRAVENOUS
  Filled 2012-02-25: qty 50

## 2012-02-25 MED ORDER — FENTANYL CITRATE 0.05 MG/ML IJ SOLN: 25.0000 ug | INTRAMUSCULAR | Status: DC | PRN

## 2012-02-25 MED ORDER — ASPIRIN 81 MG PO CHEW
81.0000 mg | CHEWABLE_TABLET | Freq: Every day | ORAL | Status: DC
  Administered 2012-02-25 – 2012-03-05 (×9): 81 mg
  Filled 2012-02-25 (×9): qty 1

## 2012-02-25 MED ORDER — FREE WATER
100.0000 mL | Freq: Three times a day (TID) | Status: DC
  Administered 2012-02-25 – 2012-03-03 (×16): 100 mL

## 2012-02-25 MED ORDER — DEXTROSE 5 % IV SOLN
1.0000 g | INTRAVENOUS | Status: DC
  Administered 2012-02-25 – 2012-02-28 (×4): 1 g via INTRAVENOUS
  Filled 2012-02-25 (×4): qty 10

## 2012-02-25 MED ORDER — FENTANYL CITRATE 0.05 MG/ML IJ SOLN
25.0000 ug | INTRAMUSCULAR | Status: DC | PRN
  Filled 2012-02-25: qty 2

## 2012-02-25 MED ORDER — SODIUM CHLORIDE 0.9 % IV SOLN
0.2000 ug/kg/h | INTRAVENOUS | Status: DC
  Administered 2012-02-25 (×2): 1 ug/kg/h via INTRAVENOUS
  Administered 2012-02-25 (×2): 0.4 ug/kg/h via INTRAVENOUS
  Administered 2012-02-25: 1 ug/kg/h via INTRAVENOUS
  Administered 2012-02-25: 0.9 ug/kg/h via INTRAVENOUS
  Administered 2012-02-26: 0.8 ug/kg/h via INTRAVENOUS
  Administered 2012-02-26 (×2): 0.7 ug/kg/h via INTRAVENOUS
  Filled 2012-02-25 (×10): qty 2

## 2012-02-25 MED ORDER — OSMOLITE 1.5 CAL PO LIQD
1000.0000 mL | ORAL | Status: DC
  Administered 2012-02-25 – 2012-03-04 (×2): 1000 mL
  Filled 2012-02-25 (×5): qty 1000

## 2012-02-25 MED ORDER — NOREPINEPHRINE BITARTRATE 1 MG/ML IJ SOLN
0.5000 ug/min | INTRAVENOUS | Status: DC
  Administered 2012-02-25: 20 ug/min via INTRAVENOUS
  Administered 2012-02-26: 10 ug/min via INTRAVENOUS
  Filled 2012-02-25 (×2): qty 16

## 2012-02-25 NOTE — Progress Notes (Signed)
[facility] Progress Note Patient Name: Logan White DOB: 20-Apr-1941 MRN: [id number]  Date of Service  02/25/2012   HPI/Events of Note  Agiated. RN fears self extubation. Patient on pressors and poor metnal status   eICU Interventions  Start precedex gtt (note :we might have to accept increased presor need)   Intervention Category Major Interventions: Delirium, psychosis, severe agitation - evaluation and management;Respiratory failure - evaluation and management  Macauley Mossberg 02/25/2012, 1:54 AM

## 2012-02-25 NOTE — Progress Notes (Signed)
Nutrition Follow-up  Intervention:   Recommend: Osmolite 1.2 @ goal rate of 50 ml/hr, with 30 ml Pro-stat 4 times daily via tube.  This will provide 1840 kcal, 127 gm protein, and 984 ml free water.    Diet Order:  NPO Patient is intubated on ventilator support.  MV: 10.8 Temp: 37.3 Propofol: none Patient has OGT in place. Osmolite 1.5 is infusing @ 20 ml/hr. Providing 720 kcal, 30 grams protein, and 365 ml H2O.  Free water flushes: 100 ml every 8 hours providing 300 of free water. Total free water: 665 ml per day. Residuals: not recorded Last bm: 6/21   Meds: Scheduled Meds:   . albuterol-ipratropium  6 puff Inhalation Q4H  . antiseptic oral rinse  15 mL Mouth Rinse QID  . aspirin  81 mg Per Tube Daily  . cefTRIAXone (ROCEPHIN)  IV  1 g Intravenous Q24H  . chlorhexidine  15 mL Mouth Rinse BID  . Chlorhexidine Gluconate Cloth  6 each Topical Q0600  . dextrose  25 mL Intravenous Once  . enoxaparin  40 mg Subcutaneous Q24H  . famotidine (PEPCID) IV  20 mg Intravenous Q12H  . free water  100 mL Per Tube Q8H  . insulin aspart  0-4 Units Subcutaneous Q4H  . mupirocin ointment  1 application Nasal BID  . pneumococcal 23 valent vaccine  0.5 mL Intramuscular Tomorrow-1000  . pneumococcal 23 valent vaccine  0.5 mL Intramuscular Tomorrow-1000  . potassium chloride  40 mEq Oral Once  . potassium chloride      . DISCONTD: artificial tears  1 application Both Eyes [id number]  . DISCONTD: fentaNYL  100 mcg Intravenous Once   Continuous Infusions:   . sodium chloride 20 mL/hr (02/25/12 0219)  . dexmedetomidine (PRECEDEX) IV infusion 1 mcg/kg/hr (02/25/12 1316)  . dextrose    . feeding supplement (OSMOLITE 1.5 CAL) 1,000 mL (02/25/12 1115)  . fentaNYL infusion INTRAVENOUS    . norepinephrine (LEVOPHED) Adult infusion 28 mcg/min (02/25/12 0700)  . DISCONTD: cisatracurium (NIMBEX) infusion Stopped (02/24/12 1225)  . DISCONTD: dextrose 50 mL/hr at 02/24/12 1900  . DISCONTD: fentaNYL infusion  INTRAVENOUS Stopped (02/24/12 1518)  . DISCONTD: fentaNYL infusion INTRAVENOUS 75 mcg/hr (02/25/12 [id number])  . DISCONTD: midazolam (VERSED) infusion Stopped (02/24/12 1519)  . DISCONTD: midazolam (VERSED) infusion    . DISCONTD: norepinephrine (LEVOPHED) Adult infusion 18 mcg/min (02/25/12 0250)   PRN Meds:.sodium chloride, dextrose, fentaNYL, DISCONTD: fentaNYL, DISCONTD: fentaNYL, DISCONTD: fentaNYL, DISCONTD: midazolam, DISCONTD: midazolam  Labs:  CMP     Component Value Date/Time   NA 135 02/25/2012 0245   K 5.5* 02/25/2012 0245   CL 98 02/25/2012 0245   CO2 23 02/25/2012 0245   GLUCOSE 113* 02/25/2012 0245   BUN 27* 02/25/2012 0245   CREATININE 1.68* 02/25/2012 0245   CALCIUM 7.5* 02/25/2012 0245   PROT 6.8 02/23/2012 0500   ALBUMIN 3.4* 02/23/2012 0500   AST 200* 02/23/2012 0500   ALT 124* 02/23/2012 0500   ALKPHOS 176* 02/23/2012 0500   BILITOT 0.4 02/23/2012 0500   GFRNONAA 40* 02/25/2012 0245   GFRAA 47* 02/25/2012 0245   CBG (last 3)   Basename 02/25/12 1312 02/25/12 0804 02/25/12 0315  GLUCAP 130* 122* 123*    Potassium  Date/Time Value Range Status  02/25/2012  2:45 AM 5.5* 3.5 - 5.1 mEq/L Final     NO VISIBLE HEMOLYSIS  02/24/2012  1:05 PM 3.3* 3.5 - 5.1 mEq/L Final  02/24/2012  8:37 AM 2.9* 3.5 - 5.1 mEq/L Final  Intake/Output Summary (Last 24 hours) at 02/25/12 1424 Last data filed at 02/25/12 1015  Gross per 24 hour  Intake 1778.41 ml  Output    415 ml  Net 1363.41 ml    Weight Status:   188 lbs 6/22  Re-estimated needs:  1877 kcal; 120-145 grams protein   Nutrition Dx:  Inadequate oral intake r/t inability to eat AEB NPO status; ongoing.  Goal: Pt to meet >/= 90% of their estimated nutrition needs; not met  Intervention:    Recommend: Osmolite 1.2 @ goal rate of 50 ml/hr, with 30 ml Pro-stat 4 times daily via tube.  This will provide 1840 kcal, 127 gm protein, and 984 ml free water.   Monitor:  Vent status, TF tolerance, I&O  [place], LDN,  CNSC [phone] weekend pager

## 2012-02-25 NOTE — Progress Notes (Signed)
Name: Logan White MRN: [id number] DOB: [birth year]    LOS: 3  Referring Provider:  Alvino Chapel Reason for Referral:  Cardiac arrest  PULMONARY / CRITICAL CARE MEDICINE  PROFILE:  This is a 71 y/o male with CAD admitted 02/22/2012 after cardiac arrest. Initial rhythm VF. Defib X 2 by EMS in field. Duration of pulselessness > 12 minutes. Underwent [place] protocol (completed 6/21 PM). Did not undergo cath   Events Since Admission: 6/19 Head CT >>  Current Status: intermittently agitated per RN. Sat up on WUA but not F/C and seemed disoriented. Presently sedated with RASS -3  Vital Signs: Temp:  [96.3 F (35.7 C)-99.8 F (37.7 C)] 99.1 F (37.3 C) (06/22 0800) Pulse Rate:  [75-95] 75  (06/22 0848) Resp:  [16-23] 18  (06/22 0848) BP: (67-141)/(8-79) 141/79 mmHg (06/22 0848) SpO2:  [93 %-100 %] 99 % (06/22 0700) FiO2 (%):  [40 %] 40 % (06/22 0848) Weight:  [85.5 kg (188 lb 7.9 oz)] 85.5 kg (188 lb 7.9 oz) (06/22 0600)  Physical Examination: Gen: sedated on vent HEENT: NCAT, PERRL, ETT in place PULM: diffuse rhonchi CV: RRR s M AB: BS+, soft, nontender, no hsm Ext: very cold feet with pulses not palpable Neuro: W/D both UEs, minimal w/d of B LE. PERRL  Intake/Output Summary (Last 24 hours) at 02/25/12 1012 Last data filed at 02/25/12 1000  Gross per 24 hour  Intake 2064.51 ml  Output    545 ml  Net 1519.51 ml   Principal Problem:  *Cardiac arrest Active Problems:  Atrial fibrillation with rapid ventricular response  Acute respiratory failure with hypoxia  CKD (chronic kidney disease)  CHF (congestive heart failure)  Ventricular fibrillation  DM (diabetes mellitus)   ASSESSMENT AND PLAN  PULMONARY  Lab 02/25/12 0244 02/24/12 0431 02/23/12 0839 02/23/12 0517 02/23/12 0140  PHART 7.392 7.507* 7.298* 7.208* 7.225*  PCO2ART 43.0 30.8* 34.1* 50.1* 45.0  PO2ART 91.0 74.5* 69.0* 79.4* 93.3  HCO3 25.6* 24.2* 17.5* 20.6 19.2*  O2SAT 96.5 97.0 95.0 94.9 96.4    Ventilator Settings: Vent Mode:  [-] PRVC FiO2 (%):  [40 %] 40 % Set Rate:  [16 bmp] 16 bmp Vt Set:  [620 mL-637 mL] 637 mL PEEP:  [5 cmH20] 5 cmH20 Plateau Pressure:  [14 cmH20-33 cmH20] 14 cmH20 CXR:  CM ,edema pattern  ETT:  6/19 >>  A:  Acute respiratory failure due to cardiac arrest; smoker, no known COPD P:   -Cont full vent. -Cont dailyWUA/SBT -Lighten sedation as tolerated   CARDIOVASCULAR  Lab 02/23/12 1646 02/23/12 1100 02/23/12 0035 02/22/12 2317 02/22/12 2316  TROPONINI <0.30 <0.30 <0.30 <0.30 --  LATICACIDVEN -- -- 1.6 -- 8.7*  PROBNP -- -- -- -- --   ECG:   Echo 6/21: LVEF 30%. Diffuse HK, moderate MR, mild PAH Lines: 6/19 R IJ CVL >>   A:  1)Cardiac arrest due to V-fib in setting of profound hypokalemia.   2) Atrial fibrillation with RVR - resolved 3) History of CHF, Afib, HTN, CAD, Cerebrovascular disease 4) Shock post arrest - likely cardiogenic P:  -Wean NE for MAP > 65 mmHg  RENAL  Lab 02/25/12 0245 02/24/12 1305 02/24/12 0837 02/24/12 0455 02/23/12 2251 02/23/12 0035  NA 135 139 139 141 141 --  K 5.5* 3.3* -- -- -- --  CL 98 101 101 101 105 --  CO2 23 27 27 30 23  --  BUN 27* 20 20 19 20  --  CREATININE 1.68* 1.27 1.19 1.14 1.18 --  CALCIUM 7.5* 7.6* 7.6* 7.5* 7.7* --  MG 1.6 -- -- 1.3* -- 1.7  PHOS 4.6 -- -- 3.0 -- --   Intake/Output      06/21 0701 - 06/22 0700 06/22 0701 - 06/23 0700   I.V. (mL/kg) 2287.6 (26.8)    NG/GT     IV Piggyback 200    Total Intake(mL/kg) 2487.6 (29.1)    Urine (mL/kg/hr) 540 (0.3) 125   Total Output 540 125   Net +1947.6 -125        Stool Occurrence 1 x     Foley:  6/19  A:   1) CKD, baseline Cr in last year is 1.2-2.1. 2) Hypokalemia, resolved 3) Mild acute renal insuff 3) HyperK+ - mild  P:   -KVO IVF. -Monitor  GASTROINTESTINAL  Lab 02/23/12 0500 02/22/12 2310  AST 200* 122*  ALT 124* 83*  ALKPHOS 176* 157*  BILITOT 0.4 0.3  PROT 6.8 6.5  ALBUMIN 3.4* 3.3*    A: Elevated LFTs -  likely shock and/or congestion.  P:   -Monitor LFTs intermittently   HEMATOLOGIC  Lab 02/25/12 0245 02/24/12 0455 02/23/12 0500 02/23/12 0035 02/22/12 2310  HGB 13.8 14.0 15.2 15.1 13.6  HCT 38.4* 38.7* 42.7 41.7 38.7*  PLT 167 164 224 205 215  INR -- -- -- -- 1.33  APTT -- -- -- -- 30   A:  No acute issues P:  -Monitor for bleeding. -Follow CBC.   INFECTIOUS  Lab 02/25/12 0245 02/24/12 0455 02/23/12 0500 02/23/12 0035 02/22/12 2317 02/22/12 2310  WBC 12.0* 5.6 11.5* 10.9* -- 11.4*  PROCALCITON -- -- -- -- <0.10 --   Cultures: Blood X 2 6/22 >>  Resp 6/22 >>  Urine 6/22 >>   Antibiotics: Ceftriaxone  A:  Low grade fever 6/22, purulent ET secretions - likely purulent tracheobronchitis P:   -Micro and Abx as above - PCT 6/23 AM   ENDOCRINE  Lab 02/25/12 0315 02/25/12 0022 02/24/12 1925 02/24/12 1716 02/24/12 1653  GLUCAP 123* 128* 120* 117* 67*   A:  History of glucose intolerance  Episodes of hypoglycemia P:   -Begin TFs - Cont SSI  NEUROLOGIC  A:  Post anoxic encephalopathy, prognosis guarded P:   -D/C continuous fentanyl. Cont Precedex. Daily WUA   BEST PRACTICE / DISPOSITION Level of Care:  ICU Primary Service: PCCM Consultants:  Interventional Cardiology [name]), [facility] Cardiology Code Status:  Full Diet:  TFs DVT Px:  lovenox GI Px:  pepcid Social / Family:  Updated at bedside  CC time 45 min.  Merton Border, M.D. Pulmonary and [place] Pager: [phone]  02/25/2012, 10:12 AM

## 2012-02-25 NOTE — Progress Notes (Signed)
Pt is coughing more and attempting to pull at ET tube and sit up.    Not following commands. Pupils continue to be pinpoint and nonreactive, but pt squints when shining light in eye.  MD notified; orders received for sedation.  Will continue to monitor closely and update as needed

## 2012-02-25 NOTE — Progress Notes (Addendum)
Patient ID: Logan White, male   DOB: [birth year], 71 y.o.   MRN: [id number]    SUBJECTIVE: Sedated on vent this morning.  Hypotensive last night, now on norepinephrine at 20.  He is in NSR.   Current Facility-Administered Medications  Medication Dose Route Frequency Provider Last Rate Last Dose  . 0.9 %  sodium chloride infusion  250 mL Intravenous PRN Juanito Doom, MD   10 mL/hr at 02/25/12 0046  . 0.9 %  sodium chloride infusion   Intravenous Continuous Colbert Coyer, MD 20 mL/hr at 02/25/12 0219 20 mL/hr at 02/25/12 0219  . albuterol-ipratropium (COMBIVENT) inhaler 6 puff  6 puff Inhalation Q4H Juanito Doom, MD   6 puff at 02/25/12 0334  . antiseptic oral rinse (BIOTENE) solution 15 mL  15 mL Mouth Rinse QID Colbert Coyer, MD   15 mL at 02/25/12 0454  . artificial tears (LACRILUBE) ophthalmic ointment 1 application  1 application Both Eyes [id number] Nathan R. Alvino Chapel, MD   1 application at [id number] [phone]  . chlorhexidine (PERIDEX) 0.12 % solution 15 mL  15 mL Mouth Rinse BID Colbert Coyer, MD   15 mL at 02/25/12 0809  . Chlorhexidine Gluconate Cloth 2 % PADS 6 each  6 each Topical Q0600 Colbert Coyer, MD   6 each at 02/25/12 0500  . dexmedetomidine (PRECEDEX) 200 mcg in sodium chloride 0.9 % 50 mL infusion  0.2-1.2 mcg/kg/hr Intravenous Continuous Brand Males, MD 8.3 mL/hr at 02/25/12 0645 0.4 mcg/kg/hr at 02/25/12 0645  . dextrose 10 % infusion   Intravenous Continuous PRN Juanito Doom, MD      . dextrose 10 % infusion   Intravenous Continuous Doree Fudge, MD 50 mL/hr at 02/24/12 1900    . dextrose 50 % solution 25 mL  25 mL Intravenous Once Rush Farmer, MD      . dextrose 50 % solution 25 mL  25 mL Intravenous Once Rush Farmer, MD   25 mL at 02/24/12 1658  . enoxaparin (LOVENOX) injection 40 mg  40 mg Subcutaneous Q24H Juanito Doom, MD   40 mg at 02/25/12 0050  . famotidine (PEPCID) IVPB 20 mg  20 mg Intravenous Q12H  Juanito Doom, MD   20 mg at 02/24/12 2101  . fentaNYL (SUBLIMAZE) 10 mcg/mL in sodium chloride 0.9 % 250 mL infusion  50-300 mcg/hr Intravenous Titrated Rush Farmer, MD 7.5 mL/hr at 02/25/12 0632 75 mcg/hr at 02/25/12 [id number]   And  . fentaNYL (SUBLIMAZE) bolus via infusion 50-100 mcg  50-100 mcg Intravenous Q6H PRN Rush Farmer, MD      . fentaNYL (SUBLIMAZE) bolus via infusion 50 mcg  50 mcg Intravenous Q15 min PRN Jasper Riling. Pickering, MD      . fentaNYL (SUBLIMAZE) injection 100 mcg  100 mcg Intravenous Once [employer]. Pickering, MD      . fentaNYL (SUBLIMAZE) injection 25-50 mcg  25-50 mcg Intravenous Q2H PRN Brand Males, MD      . insulin aspart (novoLOG) injection 0-4 Units  0-4 Units Subcutaneous Q4H Juanito Doom, MD   1 Units at 02/25/12 0454  . magnesium sulfate IVPB 2 g 50 mL  2 g Intravenous Once Juanito Doom, MD   2 g at 02/24/12 0844  . mupirocin ointment (BACTROBAN) 2 % 1 application  1 application Nasal BID Colbert Coyer, MD   1 application at [id number]  . norepinephrine (LEVOPHED) 16,000 mcg in dextrose 5 %  250 mL infusion  0.5-50 mcg/min Intravenous Titrated Juanito Doom, MD 26.3 mL/hr at 02/25/12 0700 28 mcg/min at 02/25/12 0700  . pneumococcal 23 valent vaccine (PNU-IMMUNE) injection 0.5 mL  0.5 mL Intramuscular Tomorrow-1000 Rush Farmer, MD      . pneumococcal 23 valent vaccine (PNU-IMMUNE) injection 0.5 mL  0.5 mL Intramuscular Tomorrow-1000 Juanito Doom, MD      . potassium chloride 10 mEq in 100 mL IVPB  10 mEq Intravenous Once Rush Farmer, MD   10 mEq at 02/24/12 1107  . potassium chloride 10 MEQ/50ML IVPB        10 mEq at 02/24/12 1106  . potassium chloride 20 MEQ/15ML (10%) liquid 40 mEq  40 mEq Oral Once Rush Farmer, MD   40 mEq at 02/24/12 1108  . potassium chloride 20 MEQ/15ML (10%) liquid 40 mEq  40 mEq Oral Once Rush Farmer, MD   40 mEq at 02/24/12 1530  . potassium chloride 20 MEQ/15ML (10%) liquid           .  DISCONTD: cisatracurium (NIMBEX) 200 mg in sodium chloride 0.9 % 200 mL infusion  1-1.5 mcg/kg/min Intravenous Continuous Jasper Riling. Alvino Chapel, MD   1 mcg/kg/min at 02/23/12 0119  . DISCONTD: fentaNYL (SUBLIMAZE) 10 mcg/mL in sodium chloride 0.9 % 250 mL infusion  25-300 mcg/hr Intravenous Continuous Jasper Riling. Alvino Chapel, MD   50 mcg/hr at 02/24/12 1427  . DISCONTD: midazolam (VERSED) 1 mg/mL in sodium chloride 0.9 % 50 mL infusion  1-10 mg/hr Intravenous Continuous Jasper Riling. Pickering, MD   1.5 mg/hr at 02/24/12 1425  . DISCONTD: midazolam (VERSED) 1 mg/mL in sodium chloride 0.9 % 50 mL infusion  2-10 mg/hr Intravenous Titrated Rush Farmer, MD      . DISCONTD: midazolam (VERSED) bolus via infusion 1-2 mg  1-2 mg Intravenous Q2H PRN Rush Farmer, MD      . DISCONTD: midazolam (VERSED) bolus via infusion 2 mg  2 mg Intravenous Q30 min PRN Jasper Riling. Pickering, MD      . DISCONTD: norepinephrine (LEVOPHED) 4,000 mcg in dextrose 5 % 250 mL infusion  0.5-50 mcg/min Intravenous Titrated Brand Males, MD 67.5 mL/hr at 02/25/12 0250 18 mcg/min at 02/25/12 0250  . DISCONTD: pneumococcal 23 valent vaccine (PNU-IMMUNE) injection 0.5 mL  0.5 mL Intramuscular Tomorrow-1000 Juanito Doom, MD      . DISCONTD: sodium bicarbonate 150 mEq in dextrose 5 % 1,000 mL infusion   Intravenous Continuous Rush Farmer, MD      Precedex gtt Norepinephrine at 20 Fentanyl gtt    Filed Vitals:   02/25/12 0630 02/25/12 0700 02/25/12 0800 02/25/12 0848  BP:    141/79  Pulse:    75  Temp:   99.1 F (37.3 C)   TempSrc:   Oral   Resp: 17 20  18   Height:      Weight:      SpO2: 98% 99%      Intake/Output Summary (Last 24 hours) at 02/25/12 0919 Last data filed at 02/25/12 0700  Gross per 24 hour  Intake 2127.21 ml  Output    455 ml  Net 1672.21 ml    LABS: Basic Metabolic Panel:  Basename 02/25/12 0245 02/24/12 1305 02/24/12 0455  NA 135 139 --  K 5.5* 3.3* --  CL 98 101 --  CO2 23 27 --  GLUCOSE  113* 132* --  BUN 27* 20 --  CREATININE 1.68* 1.27 --  CALCIUM 7.5*  7.6* --  MG 1.6 -- 1.3*  PHOS 4.6 -- 3.0   Liver Function Tests:  Basename 02/23/12 0500 02/22/12 2310  AST 200* 122*  ALT 124* 83*  ALKPHOS 176* 157*  BILITOT 0.4 0.3  PROT 6.8 6.5  ALBUMIN 3.4* 3.3*   No results found for this basename: LIPASE:2,AMYLASE:2 in the last 72 hours CBC:  Basename 02/25/12 0245 02/24/12 0455 02/22/12 2310  WBC 12.0* 5.6 --  NEUTROABS -- -- 4.7  HGB 13.8 14.0 --  HCT 38.4* 38.7* --  MCV 71.1* 70.6* --  PLT 167 164 --   Cardiac Enzymes:  Basename 02/23/12 1646 02/23/12 1100 02/23/12 0035  CKTOTAL 602* 502* 350*  CKMB 20.9* 17.4* 9.0*  CKMBINDEX -- -- --  TROPONINI <0.30 <0.30 <0.30    RADIOLOGY: Dg Chest Port 1 View  02/24/2012  *RADIOLOGY REPORT*  Clinical Data: Copious secretions.  Endotracheal tube position.  PORTABLE CHEST - 1 VIEW  Comparison: Portable chest 02/23/2012.  Findings: Endotracheal tube now terminates 3.5 cm above the carina. An NG tube is in the stomach.  Right IJ line is stable.  The heart is mildly enlarged.  Aeration is slightly improved.  There is persistent bibasilar airspace disease.  Small pleural effusions are suspected.  IMPRESSION:  1.  Endotracheal tube is positioned somewhat lower than on the prior exam, now 3.5 cm above the carina. 2.  Slight improved aeration with persistent bilateral pleural effusions and basilar airspace disease, likely reflecting atelectasis.  Original Report Authenticated By: Resa Miner. MATTERN, M.D.     PHYSICAL EXAM General: Intubated/sedated Lungs: Coarse breath sounds bilaterally CV: Nondisplaced PMI.  Heart regular S1/S2, no S3/S4, no murmur but difficult to hear with lung sounds.  No peripheral edema.  Abdomen: Soft, no hepatosplenomegaly, no distention.  Neurologic: Sedated. Extremities: No clubbing or cyanosis.   TELEMETRY: Reviewed telemetry pt in NSR  ASSESSMENT AND PLAN: 71 yo with history of CAD had  vfib arrest after witnessing violent event.  1. Ventricular fibrillation: s/p cardioversion, now s/p Arctic Sun protocol.  He does wake up and has been agitated but no definite purposeful movement yet.  He is now sedated on the vent.  Cause of arrest not clear => was hypokalemic on arrival but could be due to ACLS meds.  Has h/o CAD but no acute ECG changes and cardiac enzymes negative so no evidence for ACS.  Could have had arrest in the setting of baseline ischemic cardiomyopathy with scar. EF 35% with diffuse hypokinesis on echo.  - If/when neurological recovery is shown, he will need cardiac cath and likely ICD.  - Beta blocker when BP more stable.   - Should get ASA 81.  2. Hypotension: Post-arrest with decreased LV systolic function and need for heavy sedation.  BP now stable on norepinephrine (up to Q000111Q systolic).  Can start to wean norepinephrine.  3. Renal: Acute on chronic kidney injury likely related to hypoperfusion.  4. Atrial fibrillation with RVR: Currently in NSR.  Start amiodarone if it returns.   Loralie Champagne 02/25/2012 9:26 AM

## 2012-02-25 NOTE — Progress Notes (Signed)
Pt's arterial blood pressure dropped with a SBP in the 40s;  Arm BP with a SBP in 60s.  HR in lower 60s.  MD notified; ordered to increase LEVO to 35mcg.  Will continue to monitor closely and update as needed

## 2012-02-26 ENCOUNTER — Encounter (HOSPITAL_COMMUNITY): Payer: Self-pay | Admitting: *Deleted

## 2012-02-26 ENCOUNTER — Inpatient Hospital Stay (HOSPITAL_COMMUNITY): Payer: Medicare Other

## 2012-02-26 DIAGNOSIS — R570 Cardiogenic shock: Secondary | ICD-10-CM

## 2012-02-26 LAB — CBC
HCT: 37.8 % — ABNORMAL LOW (ref 39.0–52.0)
MCHC: 35.7 g/dL (ref 30.0–36.0)
Platelets: 138 10*3/uL — ABNORMAL LOW (ref 150–400)
RDW: 18.1 % — ABNORMAL HIGH (ref 11.5–15.5)

## 2012-02-26 LAB — PRO B NATRIURETIC PEPTIDE: Pro B Natriuretic peptide (BNP): 11188 pg/mL — ABNORMAL HIGH (ref 0–125)

## 2012-02-26 LAB — GLUCOSE, CAPILLARY
Glucose-Capillary: 93 mg/dL (ref 70–99)
Glucose-Capillary: 107 mg/dL — ABNORMAL HIGH (ref 70–99)
Glucose-Capillary: 121 mg/dL — ABNORMAL HIGH (ref 70–99)
Glucose-Capillary: 113 mg/dL — ABNORMAL HIGH (ref 70–99)

## 2012-02-26 LAB — BASIC METABOLIC PANEL
BUN: 27 mg/dL — ABNORMAL HIGH (ref 6–23)
Calcium: 8.4 mg/dL (ref 8.4–10.5)
Chloride: 101 mEq/L (ref 96–112)
Creatinine, Ser: 1.65 mg/dL — ABNORMAL HIGH (ref 0.50–1.35)
GFR calc Af Amer: 48 mL/min — ABNORMAL LOW (ref >90)
GFR calc non Af Amer: 41 mL/min — ABNORMAL LOW (ref >90)

## 2012-02-26 MED ORDER — DEXTROSE 50 % IV SOLN
INTRAVENOUS | Status: AC
  Administered 2012-02-26: 25 mL
  Filled 2012-02-26: qty 50

## 2012-02-26 MED ORDER — FENTANYL CITRATE 0.05 MG/ML IJ SOLN: 25.0000 ug | INTRAMUSCULAR | Status: DC | PRN

## 2012-02-26 MED ORDER — PROPOFOL 10 MG/ML IV EMUL
INTRAVENOUS | Status: AC
  Filled 2012-02-26: qty 100

## 2012-02-26 MED ORDER — PROPOFOL 10 MG/ML IV EMUL
5.0000 ug/kg/min | INTRAVENOUS | Status: DC
  Administered 2012-02-27 (×2): 20 ug/kg/min via INTRAVENOUS
  Administered 2012-02-27: 10 ug/kg/min via INTRAVENOUS
  Administered 2012-02-29 – 2012-03-01 (×5): 30 ug/kg/min via INTRAVENOUS
  Administered 2012-03-01: 40 ug/kg/min via INTRAVENOUS
  Administered 2012-03-01 (×2): 50 ug/kg/min via INTRAVENOUS
  Administered 2012-03-01: 35 ug/kg/min via INTRAVENOUS
  Administered 2012-03-02: 30 ug/kg/min via INTRAVENOUS
  Administered 2012-03-02 (×3): 35 ug/kg/min via INTRAVENOUS
  Administered 2012-03-02: 30 ug/kg/min via INTRAVENOUS
  Administered 2012-03-03: 15 ug/kg/min via INTRAVENOUS
  Administered 2012-03-03: 35 ug/kg/min via INTRAVENOUS
  Filled 2012-02-26 (×20): qty 100

## 2012-02-26 MED ORDER — FENTANYL CITRATE 0.05 MG/ML IJ SOLN
100.0000 ug | Freq: Once | INTRAMUSCULAR | Status: AC
  Administered 2012-02-26: 100 ug via INTRAVENOUS

## 2012-02-26 MED ORDER — SODIUM CHLORIDE 0.9 % IV SOLN
50.0000 ug/h | INTRAVENOUS | Status: DC
  Administered 2012-02-26: 100 ug/h via INTRAVENOUS
  Administered 2012-02-27: 300 ug/h via INTRAVENOUS
  Administered 2012-02-27: 150 ug/h via INTRAVENOUS
  Administered 2012-02-27: 100 ug/h via INTRAVENOUS
  Filled 2012-02-26 (×5): qty 50

## 2012-02-26 MED ORDER — MIDAZOLAM HCL 5 MG/ML IJ SOLN
4.0000 mg | Freq: Once | INTRAMUSCULAR | Status: AC
  Administered 2012-02-26: 4 mg via INTRAVENOUS

## 2012-02-26 MED ORDER — FUROSEMIDE 10 MG/ML IJ SOLN
40.0000 mg | Freq: Two times a day (BID) | INTRAMUSCULAR | Status: AC
  Administered 2012-02-26 – 2012-02-27 (×2): 40 mg via INTRAVENOUS
  Filled 2012-02-26 (×2): qty 4

## 2012-02-26 MED ORDER — FENTANYL BOLUS VIA INFUSION
50.0000 ug | Freq: Four times a day (QID) | INTRAVENOUS | Status: DC | PRN
  Filled 2012-02-26: qty 100

## 2012-02-26 MED ORDER — HALOPERIDOL LACTATE 5 MG/ML IJ SOLN: 5.0000 mg | Freq: Four times a day (QID) | INTRAMUSCULAR | Status: DC | PRN

## 2012-02-26 MED ORDER — PROPOFOL 10 MG/ML IV EMUL
5.0000 ug/kg/min | INTRAVENOUS | Status: DC
  Administered 2012-02-26: 10 ug/kg/min via INTRAVENOUS

## 2012-02-26 MED ORDER — MIDAZOLAM HCL 2 MG/2ML IJ SOLN
INTRAMUSCULAR | Status: AC
  Administered 2012-02-26: 4 mg
  Filled 2012-02-26: qty 4

## 2012-02-26 MED ORDER — AMIODARONE HCL IN DEXTROSE 360-4.14 MG/200ML-% IV SOLN
30.0000 mg/h | INTRAVENOUS | Status: DC
  Administered 2012-02-26: 60 mg/h via INTRAVENOUS
  Administered 2012-02-26: 30 mg/h via INTRAVENOUS
  Administered 2012-02-26: 60 mg/h via INTRAVENOUS
  Administered 2012-02-27: 30.06 mg/h via INTRAVENOUS
  Administered 2012-02-27 – 2012-03-02 (×7): 30 mg/h via INTRAVENOUS
  Filled 2012-02-26 (×30): qty 200

## 2012-02-26 NOTE — Progress Notes (Signed)
eLink Physician-Brief Progress Note Patient Name: Leonce Jett DOB: 1940-12-13 MRN: [id number]  Date of Service  02/26/2012   HPI/Events of Note  ETT in cervical trachea on CXR, good volumes on vent   eICU Interventions  Push in 5 cm, rpt CXR in am   Intervention Category Intermediate Interventions: Diagnostic test evaluation  Maye Parkinson V. 02/26/2012, 11:44 PM

## 2012-02-26 NOTE — Progress Notes (Addendum)
eLink Physician-Brief Progress Note Patient Name: Logan White DOB: 08/20/1941 MRN: [id number]  Date of Service  02/26/2012   HPI/Events of Note  RASS +4 and needing multiple providers to hold him down   eICU Interventions  Haldol 5mg  IV stat Fentanyl 120mcg IV bolus Versed 4mg  IV bolus DC precedex Start diprivn  Update  - now needing fent gtt and diprivan gtt for sedation   Intervention Category Major Interventions: Delirium, psychosis, severe agitation - evaluation and management  Mckaylah Bettendorf 02/26/2012, 4:06 PM

## 2012-02-26 NOTE — Progress Notes (Signed)
eLink Physician-Brief Progress Note Patient Name: Logan White DOB: 1941-06-20 MRN: [id number]  Date of Service  02/26/2012   HPI/Events of Note  Rn says morning cxr shows patient extubated but patient is currently intubated. I confirmed that I cannot see ET tube on cxr   eICU Interventions  Get stat cxr   Intervention Category Intermediate Interventions: Diagnostic test evaluation;Communication with other healthcare providers and/or family  Aaliah Jorgenson 02/26/2012, 10:45 PM

## 2012-02-26 NOTE — Progress Notes (Signed)
Patient ID: Logan White, male   DOB: [birth year], 71 y.o.   MRN: [id number]     SUBJECTIVE: Sedated on vent this morning.  No purposeful awakening yesterday.  Norepinephrine weaned to 3 currently.  Went back into atrial fibrillation last night, has been mildly tachycardic.   Current Facility-Administered Medications  Medication Dose Route Frequency Provider Last Rate Last Dose  . 0.9 %  sodium chloride infusion  250 mL Intravenous PRN Juanito Doom, MD   10 mL/hr at 02/25/12 0046  . 0.9 %  sodium chloride infusion   Intravenous Continuous Colbert Coyer, MD 20 mL/hr at 02/25/12 0219 20 mL/hr at 02/25/12 0219  . albuterol-ipratropium (COMBIVENT) inhaler 6 puff  6 puff Inhalation Q4H Juanito Doom, MD   6 puff at 02/26/12 0836  . antiseptic oral rinse (BIOTENE) solution 15 mL  15 mL Mouth Rinse QID Colbert Coyer, MD   15 mL at 02/26/12 0351  . aspirin chewable tablet 81 mg  81 mg Per Tube Daily Larey Dresser, MD   81 mg at 02/25/12 1000  . cefTRIAXone (ROCEPHIN) 1 g in dextrose 5 % 50 mL IVPB  1 g Intravenous Q24H Wilhelmina Mcardle, MD   1 g at 02/25/12 1101  . chlorhexidine (PERIDEX) 0.12 % solution 15 mL  15 mL Mouth Rinse BID Colbert Coyer, MD   15 mL at 02/25/12 2011  . Chlorhexidine Gluconate Cloth 2 % PADS 6 each  6 each Topical Q0600 Colbert Coyer, MD   6 each at 02/26/12 0454  . dexmedetomidine (PRECEDEX) 200 mcg in sodium chloride 0.9 % 50 mL infusion  0.2-1.2 mcg/kg/hr Intravenous Continuous Brand Males, MD 14.5 mL/hr at 02/26/12 0832 0.7 mcg/kg/hr at 02/26/12 0832  . dextrose 10 % infusion   Intravenous Continuous PRN Juanito Doom, MD      . dextrose 50 % solution        25 mL at 02/26/12 0809  . enoxaparin (LOVENOX) injection 40 mg  40 mg Subcutaneous Q24H Juanito Doom, MD   40 mg at 02/26/12 0034  . famotidine (PEPCID) IVPB 20 mg  20 mg Intravenous Q12H Juanito Doom, MD   20 mg at 02/25/12 2138  . feeding supplement  (OSMOLITE 1.5 CAL) liquid 1,000 mL  1,000 mL Per Tube Continuous Wilhelmina Mcardle, MD 20 mL/hr at 02/25/12 1115 1,000 mL at 02/25/12 1115  . fentaNYL (SUBLIMAZE) 10 mcg/mL in sodium chloride 0.9 % 250 mL infusion  300 mcg/hr Intravenous Continuous Larey Dresser, MD 15 mL/hr at 02/26/12 0516 150 mcg/hr at 02/26/12 0516  . fentaNYL (SUBLIMAZE) injection 25-50 mcg  25-50 mcg Intravenous Q1H PRN Wilhelmina Mcardle, MD      . free water 100 mL  100 mL Per Tube Q8H Wilhelmina Mcardle, MD   100 mL at 02/26/12 0535  . insulin aspart (novoLOG) injection 0-4 Units  0-4 Units Subcutaneous Q4H Juanito Doom, MD   1 Units at 02/25/12 1315  . mupirocin ointment (BACTROBAN) 2 % 1 application  1 application Nasal BID Colbert Coyer, MD   1 application at [id number]  . norepinephrine (LEVOPHED) 16,000 mcg in dextrose 5 % 250 mL infusion  0.5-50 mcg/min Intravenous Titrated Wilhelmina Mcardle, MD 2.8 mL/hr at 02/26/12 0500 3 mcg/min at 02/26/12 0500  . pneumococcal 23 valent vaccine (PNU-IMMUNE) injection 0.5 mL  0.5 mL Intramuscular Tomorrow-1000 Rush Farmer, MD      . pneumococcal 23 valent vaccine (  PNU-IMMUNE) injection 0.5 mL  0.5 mL Intramuscular Tomorrow-1000 Juanito Doom, MD   0.5 mL at 02/25/12 1143  . DISCONTD: artificial tears (LACRILUBE) ophthalmic ointment 1 application  1 application Both Eyes [id number] Nathan R. Alvino Chapel, MD   1 application at [id number] [phone]  . DISCONTD: dextrose 10 % infusion   Intravenous Continuous Doree Fudge, MD 50 mL/hr at 02/24/12 1900    . DISCONTD: fentaNYL (SUBLIMAZE) 10 mcg/mL in sodium chloride 0.9 % 250 mL infusion  50-300 mcg/hr Intravenous Titrated Rush Farmer, MD 7.5 mL/hr at 02/25/12 [id number] 75 mcg/hr at 02/25/12 [id number]  . DISCONTD: fentaNYL (SUBLIMAZE) bolus via infusion 50 mcg  50 mcg Intravenous Q15 min PRN Jasper Riling. Pickering, MD      . DISCONTD: fentaNYL (SUBLIMAZE) bolus via infusion 50-100 mcg  50-100 mcg Intravenous Q6H PRN Rush Farmer, MD        . DISCONTD: fentaNYL (SUBLIMAZE) injection 100 mcg  100 mcg Intravenous Once [employer]. Alvino Chapel, MD      . DISCONTD: fentaNYL (SUBLIMAZE) injection 25-50 mcg  25-50 mcg Intravenous Q2H PRN Brand Males, MD      Precedex gtt Norepinephrine at 3 Fentanyl gtt    Filed Vitals:   02/26/12 0600 02/26/12 0700 02/26/12 0800 02/26/12 0832  BP:  151/108    Pulse:      Temp:  97.9 F (36.6 C)    TempSrc:  Axillary    Resp: 18 17 19    Height:      Weight:      SpO2: 97% 98% 97% 98%    Intake/Output Summary (Last 24 hours) at 02/26/12 0900 Last data filed at 02/26/12 [id number]  Gross per 24 hour  Intake 1924.53 ml  Output   1660 ml  Net 264.53 ml    LABS: Basic Metabolic Panel:  Basename 02/26/12 0415 02/25/12 0245 02/24/12 0455  NA 137 135 --  K 4.7 5.5* --  CL 101 98 --  CO2 24 23 --  GLUCOSE 76 113* --  BUN 27* 27* --  CREATININE 1.65* 1.68* --  CALCIUM 8.4 7.5* --  MG -- 1.6 1.3*  PHOS -- 4.6 3.0   Liver Function Tests: No results found for this basename: AST:2,ALT:2,ALKPHOS:2,BILITOT:2,PROT:2,ALBUMIN:2 in the last 72 hours No results found for this basename: LIPASE:2,AMYLASE:2 in the last 72 hours CBC:  Basename 02/26/12 0415 02/25/12 0245  WBC 7.2 12.0*  NEUTROABS -- --  HGB 13.5 13.8  HCT 37.8* 38.4*  MCV 72.3* 71.1*  PLT 138* 167   Cardiac Enzymes:  Basename 02/23/12 1646 02/23/12 1100  CKTOTAL 602* 502*  CKMB 20.9* 17.4*  CKMBINDEX -- --  TROPONINI <0.30 <0.30    RADIOLOGY: Dg Chest Port 1 View  02/24/2012  *RADIOLOGY REPORT*  Clinical Data: Copious secretions.  Endotracheal tube position.  PORTABLE CHEST - 1 VIEW  Comparison: Portable chest 02/23/2012.  Findings: Endotracheal tube now terminates 3.5 cm above the carina. An NG tube is in the stomach.  Right IJ line is stable.  The heart is mildly enlarged.  Aeration is slightly improved.  There is persistent bibasilar airspace disease.  Small pleural effusions are suspected.  IMPRESSION:  1.   Endotracheal tube is positioned somewhat lower than on the prior exam, now 3.5 cm above the carina. 2.  Slight improved aeration with persistent bilateral pleural effusions and basilar airspace disease, likely reflecting atelectasis.  Original Report Authenticated By: Resa Miner. MATTERN, M.D.     PHYSICAL EXAM General: Intubated/sedated Lungs: Coarse breath sounds bilaterally  CV: Nondisplaced PMI.  Heart irregular S1/S2, no S3/S4, no murmur but difficult to hear with lung sounds.  No peripheral edema.  Abdomen: Soft, no hepatosplenomegaly, no distention.  Neurologic: Sedated. Extremities: No clubbing or cyanosis.   TELEMETRY: Reviewed telemetry pt in atrial fibrillation, 90s-100s  ASSESSMENT AND PLAN: 71 yo with history of CAD had vfib arrest after witnessing violent event.  1. Ventricular fibrillation: s/p cardioversion, now s/p Arctic Sun protocol.  He does wake up and has been agitated but no definite purposeful movement yet.  He is now sedated on the vent.  Cause of arrest not clear => was hypokalemic on arrival but could be due to ACLS meds.  Has h/o CAD but no acute ECG changes and cardiac enzymes negative so no evidence for ACS.  Could have had arrest in the setting of baseline ischemic cardiomyopathy with scar. EF 35% with diffuse hypokinesis on echo.  - If/when neurological recovery is shown, he will need cardiac cath and likely ICD.  - Beta blocker when BP more stable.   - Continue ASA 81   2. Hypotension: Post-arrest with decreased LV systolic function and need for heavy sedation.  BP now stable on gradually lowering dose of norepinephrine. 3. Renal: Acute on chronic kidney injury likely related to hypoperfusion. Creatinine stable today.  4. Atrial fibrillation: Mildly elevated rate.  I will put him on an amiodarone gtt to see if we can maintain NSR for better hemodynamics. 5. Neuro: sedation wean today to see if he awakens purposefully.    Loralie Champagne 02/26/2012 9:00  AM

## 2012-02-26 NOTE — Progress Notes (Signed)
Name: Logan White MRN: [id number] DOB: 1941-04-24    LOS: 4  Referring Provider:  Alvino Chapel Reason for Referral:  Cardiac arrest  PULMONARY / CRITICAL CARE MEDICINE  PROFILE:  This is a 71 y/o male with CAD admitted 02/22/2012 after cardiac arrest. Initial rhythm VF. Defib X 2 by EMS in field. Duration of pulselessness > 12 minutes. Underwent Arctic Sun protocol (completed 6/21 PM). Did not undergo cath   Events Since Admission: 6/19 Head CT >>NAD  Current Status: intermittently agitated per RN. Sat up on WUA but not F/C and seemed disoriented. Presently sedated with RASS -3  Vital Signs: Temp:  [97.1 F (36.2 C)-100 F (37.8 C)] 97.1 F (36.2 C) (06/23 1700) Resp:  [16-23] 16  (06/23 1800) BP: (87-151)/(55-108) 97/66 mmHg (06/23 1700) SpO2:  [87 %-100 %] 100 % (06/23 1800) FiO2 (%):  [40 %] 40 % (06/23 1615) Weight:  [85.3 kg (188 lb 0.8 oz)] 85.3 kg (188 lb 0.8 oz) (06/23 0500)  Physical Examination: Gen: sedated on vent HEENT: NCAT, PERRL, ETT in place PULM: diffuse rhonchi CV: RRR s M AB: BS+, soft, nontender, no hsm Ext: very cold feet with pulses not palpable Neuro: W/D both UEs, minimal w/d of B LE. PERRL  Intake/Output Summary (Last 24 hours) at 02/26/12 1900 Last data filed at 02/26/12 1817  Gross per 24 hour  Intake 1553.02 ml  Output   2950 ml  Net -1396.98 ml   Principal Problem:  *Cardiac arrest Active Problems:  Acute respiratory failure with hypoxia  Ventricular fibrillation  Cardiogenic shock  Anoxic encephalopathy  DM (diabetes mellitus)  Acute renal failure  Ischemic cardiomyopathy  Atrial fibrillation with rapid ventricular response  CKD (chronic kidney disease)   ASSESSMENT AND PLAN  PULMONARY  Lab 02/25/12 0244 02/24/12 0431 02/23/12 0839 02/23/12 0517 02/23/12 0140  PHART 7.392 7.507* 7.298* 7.208* 7.225*  PCO2ART 43.0 30.8* 34.1* 50.1* 45.0  PO2ART 91.0 74.5* 69.0* 79.4* 93.3  HCO3 25.6* 24.2* 17.5* 20.6 19.2*  O2SAT 96.5  97.0 95.0 94.9 96.4   Ventilator Settings: Vent Mode:  [-] PRVC FiO2 (%):  [40 %] 40 % Set Rate:  [16 bmp] 16 bmp Vt Set:  [620 mL] 620 mL PEEP:  [5 cmH20] 5 cmH20 Plateau Pressure:  [22 cmH20-25 cmH20] 25 cmH20 CXR:  CM ,edema pattern  ETT:  6/19 >>  A:  Acute respiratory failure due to cardiac arrest; smoker, no known COPD P:   -Cont full vent. -Cont dailyWUA/SBT -Lighten sedation as tolerated   CARDIOVASCULAR  Lab 02/26/12 0415 02/23/12 1646 02/23/12 1100 02/23/12 0035 02/22/12 2317 02/22/12 2316  TROPONINI -- <0.30 <0.30 <0.30 <0.30 --  LATICACIDVEN -- -- -- 1.6 -- 8.7*  PROBNP 11188.0* -- -- -- -- --   ECG:   Echo 6/21: LVEF 30%. Diffuse HK, moderate MR, mild PAH Lines: 6/19 R IJ CVL >>   A:  1)Cardiac arrest due to V-fib in setting of profound hypokalemia.   2) Atrial fibrillation with RVR - resolved 3) History of CHF, Afib, HTN, CAD, Cerebrovascular disease 4) Shock post arrest - likely cardiogenic P:  -Wean NE for MAP > 65 mmHg  RENAL  Lab 02/26/12 0415 02/25/12 0245 02/24/12 1305 02/24/12 0837 02/24/12 0455 02/23/12 0035  NA 137 135 139 139 141 --  K 4.7 5.5* -- -- -- --  CL 101 98 101 101 101 --  CO2 24 23 27 27 30  --  BUN 27* 27* 20 20 19  --  CREATININE 1.65* 1.68* 1.27 1.19  1.14 --  CALCIUM 8.4 7.5* 7.6* 7.6* 7.5* --  MG -- 1.6 -- -- 1.3* 1.7  PHOS -- 4.6 -- -- 3.0 --   Intake/Output      06/22 0701 - 06/23 0700 06/23 0701 - 06/24 0700   I.V. (mL/kg) 1307 (15.3) 491.7 (5.8)   NG/GT 520 0   IV Piggyback 150 54   Total Intake(mL/kg) 1977 (23.2) 545.7 (6.4)   Urine (mL/kg/hr) 1060 (0.5) 1150 (1.1)   Emesis/NG output  1100   Total Output 1060 2250   Net +917 -1704.3         Foley:  6/19  A:   1) CKD, baseline Cr in last year is 1.2-2.1. 2) Hypokalemia, resolved 3) Mild acute renal insuff 3) HyperK+ - mild  P:   -KVO IVF. -Monitor  GASTROINTESTINAL  Lab 02/23/12 0500 02/22/12 2310  AST 200* 122*  ALT 124* 83*  ALKPHOS 176* 157*    BILITOT 0.4 0.3  PROT 6.8 6.5  ALBUMIN 3.4* 3.3*    A: Elevated LFTs - likely shock and/or congestion.  P:   -Monitor LFTs intermittently   HEMATOLOGIC  Lab 02/26/12 0415 02/25/12 0245 02/24/12 0455 02/23/12 0500 02/23/12 0035 02/22/12 2310  HGB 13.5 13.8 14.0 15.2 15.1 --  HCT 37.8* 38.4* 38.7* 42.7 41.7 --  PLT 138* 167 164 224 205 --  INR -- -- -- -- -- 1.33  APTT -- -- -- -- -- 30   A:  No acute issues P:  -Monitor for bleeding. -Follow CBC.   INFECTIOUS  Lab 02/26/12 0415 02/25/12 0245 02/24/12 0455 02/23/12 0500 02/23/12 0035 02/22/12 2317  WBC 7.2 12.0* 5.6 11.5* 10.9* --  PROCALCITON 13.17 -- -- -- -- <0.10   Cultures: Blood X 2 6/22 >>  Resp 6/22 >>  Urine 6/22 >>   Antibiotics: Ceftriaxone 6/22 >>   A:  Low grade fever 6/22, purulent ET secretions - likely purulent tracheobronchitis P:   -Micro and Abx as above - PCT 6/23 AM - 13.17   ENDOCRINE  Lab 02/26/12 1617 02/26/12 1158 02/26/12 1024 02/26/12 0748 02/26/12 0341  GLUCAP 116* 121* 107* 69* 73   A:  History of glucose intolerance  Episodes of hypoglycemia P:   -Begin TFs - Cont SSI  NEUROLOGIC  A:  Post anoxic encephalopathy, prognosis guarded P:   -D/C continuous fentanyl. Cont Precedex. Daily WUA   BEST PRACTICE / DISPOSITION Level of Care:  ICU Primary Service: PCCM Consultants:  Interventional Cardiology [name]), [facility] Code Status:  Full Diet:  TFs DVT Px:  lovenox GI Px:  pepcid Social / Family:  Updated at bedside  CC time 35 min.  Merton Border, M.D. Pulmonary and [employer] Pager: [phone]  02/26/2012, 7:00 PM

## 2012-02-27 ENCOUNTER — Inpatient Hospital Stay (HOSPITAL_COMMUNITY): Payer: Medicare Other

## 2012-02-27 DIAGNOSIS — A419 Sepsis, unspecified organism: Secondary | ICD-10-CM | POA: Diagnosis not present

## 2012-02-27 DIAGNOSIS — R6521 Severe sepsis with septic shock: Secondary | ICD-10-CM

## 2012-02-27 DIAGNOSIS — R6520 Severe sepsis without septic shock: Secondary | ICD-10-CM

## 2012-02-27 LAB — GLUCOSE, CAPILLARY
Glucose-Capillary: 117 mg/dL — ABNORMAL HIGH (ref 70–99)
Glucose-Capillary: 96 mg/dL (ref 70–99)
Glucose-Capillary: 99 mg/dL (ref 70–99)

## 2012-02-27 LAB — CBC
MCV: 71.1 fL — ABNORMAL LOW (ref 78.0–100.0)
Platelets: 156 10*3/uL (ref 150–400)
RDW: 17.7 % — ABNORMAL HIGH (ref 11.5–15.5)
WBC: 9.5 10*3/uL (ref 4.0–10.5)

## 2012-02-27 LAB — BASIC METABOLIC PANEL
Calcium: 9.2 mg/dL (ref 8.4–10.5)
Creatinine, Ser: 2 mg/dL — ABNORMAL HIGH (ref 0.50–1.35)
GFR calc Af Amer: 38 mL/min — ABNORMAL LOW (ref >90)

## 2012-02-27 MED ORDER — VANCOMYCIN HCL 1000 MG IV SOLR
1500.0000 mg | INTRAVENOUS | Status: DC
  Administered 2012-02-27 – 2012-02-28 (×2): 1500 mg via INTRAVENOUS
  Filled 2012-02-27 (×3): qty 1500

## 2012-02-27 MED ORDER — DEXTROSE 10 % IV SOLN
INTRAVENOUS | Status: DC
  Administered 2012-02-27: 06:00:00 via INTRAVENOUS

## 2012-02-27 NOTE — Progress Notes (Addendum)
Name: Logan White MRN: [id number] DOB: 1941/05/13    LOS: 5  Referring Provider:  Alvino Chapel Reason for Referral:  Cardiac arrest  PULMONARY / CRITICAL CARE MEDICINE  PROFILE:  This is a 71 y/o male with CAD admitted 02/22/2012 after cardiac arrest. Initial rhythm VF. Defib X 2 by EMS in field. Duration of pulselessness > 12 minutes. Underwent [place] protocol (completed 6/21 PM). Did not undergo cath   Events Since Admission: 6/19 Head CT >>NAD 6/23 Severe agiotation - on fent and diprivan Current Status:  Severe agiation. On fent and diprivan On pressors RN not sure if he followed commands briefly Had high residuals yesterday but improved Still on amio gtt No family at bedside   Vital Signs: Temp:  [96.4 F (35.8 C)-99.7 F (37.6 C)] 96.4 F (35.8 C) (06/24 1123) Pulse Rate:  [73-96] 83  (06/24 1204) Resp:  [16-24] 16  (06/24 1300) BP: (81-163)/(55-104) 112/66 mmHg (06/24 1300) SpO2:  [86 %-100 %] 100 % (06/24 1300) FiO2 (%):  [40 %] 40 % (06/24 1204) Weight:  [82.5 kg (181 lb 14.1 oz)-85.3 kg (188 lb 0.8 oz)] 82.5 kg (181 lb 14.1 oz) (06/24 0800)  Physical Examination: Gen: sedated on vent HEENT: NCAT, PERRL, ETT in place PULM: diffuse rhonchi CV: RRR s M AB: BS+, soft, nontender, no hsm Ext: very cold feet with pulses not palpable Neuro RASS +4 on wua  Neuro:  Intake/Output Summary (Last 24 hours) at 02/27/12 1331 Last data filed at 02/27/12 1323  Gross per 24 hour  Intake 1461.9 ml  Output   6650 ml  Net -5188.1 ml   Principal Problem:  *Cardiac arrest Active Problems:  Septic shock  Atrial fibrillation with rapid ventricular response  Acute respiratory failure with hypoxia  CKD (chronic kidney disease)  Ventricular fibrillation  DM (diabetes mellitus)  Cardiogenic shock  Acute renal failure  Anoxic encephalopathy  Ischemic cardiomyopathy   ASSESSMENT AND PLAN  PULMONARY  Lab 02/25/12 0244 02/24/12 0431 02/23/12 0839 02/23/12 0517  02/23/12 0140  PHART 7.392 7.507* 7.298* 7.208* 7.225*  PCO2ART 43.0 30.8* 34.1* 50.1* 45.0  PO2ART 91.0 74.5* 69.0* 79.4* 93.3  HCO3 25.6* 24.2* 17.5* 20.6 19.2*  O2SAT 96.5 97.0 95.0 94.9 96.4   Ventilator Settings: Vent Mode:  [-] PRVC FiO2 (%):  [40 %] 40 % Set Rate:  [16 bmp] 16 bmp Vt Set:  [620 mL] 620 mL PEEP:  [5 cmH20] 5 cmH20 Plateau Pressure:  [19 cmH20-25 cmH20] 21 cmH20 CXR:  CM ,edema pattern  ETT:  6/19 >>  A:  Acute respiratory failure due to cardiac arrest; smoker, no known COPD  -on 6/24: does not meet sbt criteria due to shock and agitation P:   -Cont full vent. -Cont dailyWUA/SBT -Lighten sedation as tolerated but needing sedation gtt on 6/24   CARDIOVASCULAR  Lab 02/26/12 0415 02/23/12 1646 02/23/12 1100 02/23/12 0035 02/22/12 2317 02/22/12 2316  TROPONINI -- <0.30 <0.30 <0.30 <0.30 --  LATICACIDVEN -- -- -- 1.6 -- 8.7*  PROBNP 11188.0* -- -- -- -- --   ECG:   Echo 6/21: LVEF 30%. Diffuse HK, moderate MR, mild PAH Lines: 6/19 R IJ CVL >>   A:  1)Cardiac arrest due to V-fib in setting of profound hypokalemia.   2) Atrial fibrillation with RVR - resolved 3) History of CHF, Afib, HTN, CAD, Cerebrovascular disease 4) Shock post arrest - likely cardiogenic P:  -Wean NE for MAP > 65 mmHg  RENAL  Lab 02/27/12 0334 02/26/12 0415 02/25/12 0245  02/24/12 1305 02/24/12 0837 02/24/12 0455 02/23/12 0035  NA 138 137 135 139 139 -- --  K 3.7 4.7 -- -- -- -- --  CL 99 101 98 101 101 -- --  CO2 27 24 23 27 27  -- --  BUN 30* 27* 27* 20 20 -- --  CREATININE 2.00* 1.65* 1.68* 1.27 1.19 -- --  CALCIUM 9.2 8.4 7.5* 7.6* 7.6* -- --  MG -- -- 1.6 -- -- 1.3* 1.7  PHOS -- -- 4.6 -- -- 3.0 --   Intake/Output      06/23 0701 - 06/24 0700 06/24 0701 - 06/25 0700   I.V. (mL/kg) 1098.7 (12.9) 235.2 (2.9)   NG/GT 100 140   IV Piggyback 54 100   Total Intake(mL/kg) 1252.7 (14.7) 475.2 (5.8)   Urine (mL/kg/hr) 5000 (2.4) 2050 (3.8)   Emesis/NG output 1150    Total  Output 6150 2050   Net -4897.4 -1574.8         Foley:  6/19  A:   1) CKD, baseline Cr in last year is 1.2-2.1. 2) Mild acute renal insuff getting worse 6/24   P:   -KVO IVF. -Monitor  GASTROINTESTINAL  Lab 02/23/12 0500 02/22/12 2310  AST 200* 122*  ALT 124* 83*  ALKPHOS 176* 157*  BILITOT 0.4 0.3  PROT 6.8 6.5  ALBUMIN 3.4* 3.3*    A: Elevated LFTs - likely shock and/or congestion.  P:   -Monitor LFTs intermittently   HEMATOLOGIC  Lab 02/27/12 0334 02/26/12 0415 02/25/12 0245 02/24/12 0455 02/23/12 0500 02/22/12 2310  HGB 12.9* 13.5 13.8 14.0 15.2 --  HCT 36.2* 37.8* 38.4* 38.7* 42.7 --  PLT 156 138* 167 164 224 --  INR -- -- -- -- -- 1.33  APTT -- -- -- -- -- 30   A:  No acute issues P:  - - PRBC for hgb </= 6.9gm%    - exceptions are   -  if ACS susepcted/confirmed then transfuse for hgb </= 8.0gm%,  or    -  If septic shock first 24h and scvo2 < 70% then transfuse for hgb </= 9.0gm%   - active bleeding with hemodynamic instability, then transfuse regardless of hemoglobin value   At at all times try to transfuse 1 unit prbc as possible with exception of active hemorrhage    INFECTIOUS  Lab 02/27/12 0334 02/26/12 0415 02/25/12 0245 02/24/12 0455 02/23/12 0500 02/22/12 2317  WBC 9.5 7.2 12.0* 5.6 11.5* --  PROCALCITON -- 13.17 -- -- -- <0.10   Cultures: Blood X 2 6/22 >>  Resp 6/22 >>  Urine 6/22 >>   Results for orders placed during the hospital encounter of 02/22/12  CULTURE, BLOOD (ROUTINE X 2)     Status: Normal (Preliminary result)   Collection Time   02/22/12 11:03 PM      Component Value Range Status Comment   Specimen Description BLOOD RIGHT HAND   Final    Special Requests BOTTLES DRAWN AEROBIC AND ANAEROBIC 10CC EACH   Final    Culture  Setup Time [id number]   Final    Culture     Final    Value:        BLOOD CULTURE RECEIVED NO GROWTH TO DATE CULTURE WILL BE HELD FOR 5 DAYS BEFORE ISSUING A FINAL NEGATIVE REPORT   Report Status  PENDING   Incomplete   CULTURE, BLOOD (ROUTINE X 2)     Status: Normal (Preliminary result)   Collection Time   02/22/12 11:08  PM      Component Value Range Status Comment   Specimen Description BLOOD LEFT HAND   Final    Special Requests BOTTLES DRAWN AEROBIC ONLY 5CC    Final    Culture  Setup Time [id number]   Final    Culture     Final    Value:        BLOOD CULTURE RECEIVED NO GROWTH TO DATE CULTURE WILL BE HELD FOR 5 DAYS BEFORE ISSUING A FINAL NEGATIVE REPORT   Report Status PENDING   Incomplete   URINE CULTURE     Status: Normal   Collection Time   02/22/12 11:40 PM      Component Value Range Status Comment   Specimen Description URINE, CATHETERIZED   Final    Special Requests NONE   Final    Culture  Setup Time [id number]   Final    Colony Count NO GROWTH   Final    Culture NO GROWTH   Final    Report Status 02/24/2012 FINAL   Final   MRSA PCR SCREENING     Status: Abnormal   Collection Time   02/23/12  1:07 AM      Component Value Range Status Comment   MRSA by PCR POSITIVE (*) NEGATIVE Final   CULTURE, RESPIRATORY     Status: Normal (Preliminary result)   Collection Time   02/25/12 12:45 PM      Component Value Range Status Comment   Specimen Description TRACHEAL ASPIRATE   Final    Special Requests NONE   Final    Gram Stain     Final    Value: ABUNDANT WBC PRESENT, PREDOMINANTLY PMN     ABUNDANT GRAM NEGATIVE COCCOBACILLI     MODERATE GRAM POSITIVE COCCI IN PAIRS   Culture Culture reincubated for better growth   Final    Report Status PENDING   Incomplete   CULTURE, BLOOD (ROUTINE X 2)     Status: Normal (Preliminary result)   Collection Time   02/25/12  1:04 PM      Component Value Range Status Comment   Specimen Description BLOOD RIGHT HAND   Final    Special Requests BOTTLES DRAWN AEROBIC AND ANAEROBIC 10CC   Final    Culture  Setup Time [id number]   Final    Culture     Final    Value:        BLOOD CULTURE RECEIVED NO GROWTH TO DATE CULTURE WILL BE HELD  FOR 5 DAYS BEFORE ISSUING A FINAL NEGATIVE REPORT   Report Status PENDING   Incomplete   CULTURE, BLOOD (ROUTINE X 2)     Status: Normal (Preliminary result)   Collection Time   02/25/12  1:18 PM      Component Value Range Status Comment   Specimen Description BLOOD LEFT HAND   Final    Special Requests BOTTLES DRAWN AEROBIC ONLY [facility]   Final    Culture  Setup Time [id number]   Final    Culture     Final    Value:        BLOOD CULTURE RECEIVED NO GROWTH TO DATE CULTURE WILL BE HELD FOR 5 DAYS BEFORE ISSUING A FINAL NEGATIVE REPORT   Report Status PENDING   Incomplete   CULTURE, RESPIRATORY     Status: Normal (Preliminary result)   Collection Time   02/26/12  6:57 AM      Component Value Range Status Comment   Specimen Description TRACHEAL  ASPIRATE   Final    Special Requests NONE   Final    Gram Stain PENDING   Incomplete    Culture Culture reincubated for better growth   Final    Report Status PENDING   Incomplete      Antibiotics: Anti-infectives     Start     Dose/Rate Route Frequency Ordered Stop   02/25/12 1100   cefTRIAXone (ROCEPHIN) 1 g in dextrose 5 % 50 mL IVPB        1 g 100 mL/hr over 30 Minutes Intravenous Every 24 hours 02/25/12 1012             Ceftriaxone 6/22 >>   A:  Low grade fever 6/22, purulent ET secretions - likely purulent tracheobronchitis On 6/24 - hypothermiic and still pn pressors -septic shck P:   -Micro and Abx as above - PCT 6/23 AM - 13.17 - add vancomycin  ENDOCRINE  Lab 02/27/12 1205 02/27/12 0808 02/27/12 0337 02/27/12 0024 02/26/12 1948  GLUCAP 113* 98 91 96 113*   A:  History of glucose intolerance  Episodes of hypoglycemia P:   -Begin TFs - Cont SSI  NEUROLOGIC  A:   - on 6/24: RASS +4 on WUA. ? Followed x 1 command. Needing continuous sedation P:   fent gtt  diprivan gtt Daily [place] / DISPOSITION Level of Care:  ICU Primary Service: PCCM Consultants:  Interventional Cardiology [name]), LB  Cardiology Code Status:  Full Diet:  TFs DVT Px:  lovenox GI Px:  pepcid Social / Family:  Not available 6/24. Will get social work consult  The patient is critically ill with multiple organ systems failure and requires high complexity decision making for assessment and support, frequent evaluation and titration of therapies, application of advanced monitoring technologies and extensive interpretation of multiple databases.   Critical Care Time devoted to patient care services described in this note is  35  Minutes.  Dr. Brand Males, M.D., [facility].C.P Pulmonary and Critical Care Medicine Staff Physician [facility] Pager: [phone], If no answer or between  15:00h - 7:00h: call [phone]  02/27/2012 2:19 PM

## 2012-02-27 NOTE — Progress Notes (Signed)
ANTIBIOTIC CONSULT NOTE - INITIAL  Pharmacy Consult for Vancomycin Indication: PNA r/o sepsis  No Known Allergies  Patient Measurements: Height: 6' (182.9 cm) Weight: 181 lb 14.1 oz (82.5 kg) IBW/kg (Calculated) : 77.6   Vital Signs: Temp: 96.4 F (35.8 C) (06/24 1123) Temp src: Axillary (06/24 1123) BP: 112/66 mmHg (06/24 1300) Pulse Rate: 83  (06/24 1204) Intake/Output from previous day: 06/23 0701 - 06/24 0700 In: 1262 [I.V.:1108; NG/GT:100; IV Piggyback:54] Out: [id number] [Urine:5000; Emesis/NG output:1150] Intake/Output from this shift: Total I/O In: 723.6 [I.V.:463.6; NG/GT:160; IV Piggyback:100] Out: 2050 [Urine:2050]  Labs:  [facility] 02/27/12 0334 02/26/12 0415 02/25/12 0245  WBC 9.5 7.2 12.0*  HGB 12.9* 13.5 13.8  PLT 156 138* 167  LABCREA -- -- --  CREATININE 2.00* 1.65* 1.68*   Estimated Creatinine Clearance: 38.8 ml/min (by C-G formula based on Cr of 2). No results found for this basename: VANCOTROUGH:2,VANCOPEAK:2,VANCORANDOM:2,GENTTROUGH:2,GENTPEAK:2,GENTRANDOM:2,TOBRATROUGH:2,TOBRAPEAK:2,TOBRARND:2,AMIKACINPEAK:2,AMIKACINTROU:2,AMIKACIN:2, in the last 72 hours   Microbiology: Recent Results (from the past 720 hour(s))  CULTURE, BLOOD (ROUTINE X 2)     Status: Normal (Preliminary result)   Collection Time   02/22/12 11:03 PM      Component Value Range Status Comment   Specimen Description BLOOD RIGHT HAND   Final    Special Requests BOTTLES DRAWN AEROBIC AND ANAEROBIC 10CC EACH   Final    Culture  Setup Time [id number]   Final    Culture     Final    Value:        BLOOD CULTURE RECEIVED NO GROWTH TO DATE CULTURE WILL BE HELD FOR 5 DAYS BEFORE ISSUING A FINAL NEGATIVE REPORT   Report Status PENDING   Incomplete   CULTURE, BLOOD (ROUTINE X 2)     Status: Normal (Preliminary result)   Collection Time   02/22/12 11:08 PM      Component Value Range Status Comment   Specimen Description BLOOD LEFT HAND   Final    Special Requests BOTTLES DRAWN AEROBIC ONLY  5CC    Final    Culture  Setup Time [id number]   Final    Culture     Final    Value:        BLOOD CULTURE RECEIVED NO GROWTH TO DATE CULTURE WILL BE HELD FOR 5 DAYS BEFORE ISSUING A FINAL NEGATIVE REPORT   Report Status PENDING   Incomplete   URINE CULTURE     Status: Normal   Collection Time   02/22/12 11:40 PM      Component Value Range Status Comment   Specimen Description URINE, CATHETERIZED   Final    Special Requests NONE   Final    Culture  Setup Time [id number]   Final    Colony Count NO GROWTH   Final    Culture NO GROWTH   Final    Report Status 02/24/2012 FINAL   Final   MRSA PCR SCREENING     Status: Abnormal   Collection Time   02/23/12  1:07 AM      Component Value Range Status Comment   MRSA by PCR POSITIVE (*) NEGATIVE Final   CULTURE, RESPIRATORY     Status: Normal (Preliminary result)   Collection Time   02/25/12 12:45 PM      Component Value Range Status Comment   Specimen Description TRACHEAL ASPIRATE   Final    Special Requests NONE   Final    Gram Stain     Final    Value: ABUNDANT WBC PRESENT, PREDOMINANTLY PMN  ABUNDANT GRAM NEGATIVE COCCOBACILLI     MODERATE GRAM POSITIVE COCCI IN PAIRS   Culture Culture reincubated for better growth   Final    Report Status PENDING   Incomplete   CULTURE, BLOOD (ROUTINE X 2)     Status: Normal (Preliminary result)   Collection Time   02/25/12  1:04 PM      Component Value Range Status Comment   Specimen Description BLOOD RIGHT HAND   Final    Special Requests BOTTLES DRAWN AEROBIC AND ANAEROBIC 10CC   Final    Culture  Setup Time [id number]   Final    Culture     Final    Value:        BLOOD CULTURE RECEIVED NO GROWTH TO DATE CULTURE WILL BE HELD FOR 5 DAYS BEFORE ISSUING A FINAL NEGATIVE REPORT   Report Status PENDING   Incomplete   CULTURE, BLOOD (ROUTINE X 2)     Status: Normal (Preliminary result)   Collection Time   02/25/12  1:18 PM      Component Value Range Status Comment   Specimen Description  BLOOD LEFT HAND   Final    Special Requests BOTTLES DRAWN AEROBIC ONLY [facility]   Final    Culture  Setup Time [id number]   Final    Culture     Final    Value:        BLOOD CULTURE RECEIVED NO GROWTH TO DATE CULTURE WILL BE HELD FOR 5 DAYS BEFORE ISSUING A FINAL NEGATIVE REPORT   Report Status PENDING   Incomplete   CULTURE, RESPIRATORY     Status: Normal (Preliminary result)   Collection Time   02/26/12  6:57 AM      Component Value Range Status Comment   Specimen Description TRACHEAL ASPIRATE   Final    Special Requests NONE   Final    Gram Stain PENDING   Incomplete    Culture Culture reincubated for better growth   Final    Report Status PENDING   Incomplete     Medical History: Past Medical History  Diagnosis Date  . Coronary artery disease   . Hypertension   . Diabetes mellitus   . Pneumonia   . Myocardial infarction   . Carotid artery stenosis     Medications:  Scheduled:    . albuterol-ipratropium  6 puff Inhalation Q4H  . antiseptic oral rinse  15 mL Mouth Rinse QID  . aspirin  81 mg Per Tube Daily  . cefTRIAXone (ROCEPHIN)  IV  1 g Intravenous Q24H  . chlorhexidine  15 mL Mouth Rinse BID  . Chlorhexidine Gluconate Cloth  6 each Topical Q0600  . enoxaparin  40 mg Subcutaneous Q24H  . famotidine (PEPCID) IV  20 mg Intravenous Q12H  . fentaNYL  100 mcg Intravenous Once  . free water  100 mL Per Tube Q8H  . furosemide  40 mg Intravenous BID  . insulin aspart  0-4 Units Subcutaneous Q4H  . midazolam      . midazolam  4 mg Intravenous Once  . mupirocin ointment  1 application Nasal BID  . propofol       Assessment: 71 y/o male patient s/p cardiac arrest, now intubated/sedated requiring broad spectrum antibiotics d/t low grade fever and increased secretions for pneumonia vs sepsis. Renal fxn declining, will adjust dosing. All cx ngtd.  Goal of Therapy:  Vancomycin trough level 15-20 mcg/ml  Plan:  Vancomycin 1500mg  IV q24, monitor renal function and f/u  c&s. Measure antibiotic drug levels at steady state  Davonna Belling, PharmD, [place] Pager [phone] 02/27/2012,2:38 PM

## 2012-02-27 NOTE — Progress Notes (Signed)
Clinical Social Worker received inappropriate referral for advance directive request. Patient is currently on a vent, sedated and intubated. Family was walking in room when CSW came by. CSW will sign off as social work intervention is no longer needed. Please re-consult CSW if new needs arises.   Leandro Reasoner MSW, [place]

## 2012-02-27 NOTE — Progress Notes (Signed)
Patient's ET tube advanced 2cm. Patient's ET tube is now at 26cm. Chest xray ordered.

## 2012-02-27 NOTE — Progress Notes (Signed)
CCM Dr notified of OGT residual of 399ml . TF continued.

## 2012-02-27 NOTE — Plan of Care (Signed)
Problem: Phase III Progression Outcomes Goal: Mental status at or near baseline Outcome: Not Progressing Does not follow simple commands  . No apparent eye contact. Does not become calm w/reassurance.

## 2012-02-27 NOTE — Consult Note (Signed)
Pt still intubated. Will keep an eye on his progress.

## 2012-02-28 ENCOUNTER — Inpatient Hospital Stay (HOSPITAL_COMMUNITY): Payer: Medicare Other

## 2012-02-28 ENCOUNTER — Ambulatory Visit: Payer: Medicare Other | Admitting: Gastroenterology

## 2012-02-28 DIAGNOSIS — R569 Unspecified convulsions: Secondary | ICD-10-CM

## 2012-02-28 LAB — CULTURE, RESPIRATORY W GRAM STAIN

## 2012-02-28 LAB — GLUCOSE, CAPILLARY

## 2012-02-28 MED ORDER — PIPERACILLIN-TAZOBACTAM 3.375 G IVPB
3.3750 g | Freq: Three times a day (TID) | INTRAVENOUS | Status: DC
  Administered 2012-02-28 – 2012-03-03 (×11): 3.375 g via INTRAVENOUS
  Filled 2012-02-28 (×13): qty 50

## 2012-02-28 MED ORDER — LIDOCAINE HCL (CARDIAC) 20 MG/ML IV SOLN
INTRAVENOUS | Status: AC
  Filled 2012-02-28: qty 5

## 2012-02-28 MED ORDER — ROCURONIUM BROMIDE 50 MG/5ML IV SOLN
INTRAVENOUS | Status: AC
  Filled 2012-02-28: qty 2

## 2012-02-28 MED ORDER — SUCCINYLCHOLINE CHLORIDE 20 MG/ML IJ SOLN
INTRAMUSCULAR | Status: AC
  Filled 2012-02-28: qty 10

## 2012-02-28 MED ORDER — FUROSEMIDE 10 MG/ML IJ SOLN
40.0000 mg | Freq: Once | INTRAMUSCULAR | Status: AC
  Administered 2012-02-28: 40 mg via INTRAVENOUS
  Filled 2012-02-28: qty 4

## 2012-02-28 MED ORDER — ROCURONIUM BROMIDE 50 MG/5ML IV SOLN
70.0000 mg | Freq: Once | INTRAVENOUS | Status: AC
  Administered 2012-02-28: 70 mg via INTRAVENOUS
  Filled 2012-02-28: qty 7

## 2012-02-28 MED ORDER — HALOPERIDOL LACTATE 5 MG/ML IJ SOLN: 5.0000 mg | Freq: Four times a day (QID) | INTRAMUSCULAR | Status: DC

## 2012-02-28 MED ORDER — ETOMIDATE 2 MG/ML IV SOLN
INTRAVENOUS | Status: AC
  Filled 2012-02-28: qty 20

## 2012-02-28 NOTE — Progress Notes (Signed)
Nutrition Follow-up  Recommend: Osmolite 1.2 @ goal rate of 50 ml/hr, with 30 ml Pro-stat 4 times daily via tube.  This will provide 1840 kcal, 127 gm protein, and 984 ml free water.  Current TF + propofol calories is meeting only 49% of kcal needs and 25% protein needs.   Pt remains intubated and sedated. Continues with Osmolite 1.5 at 20 ml/hr providing 720 kcal, 30 gm protein and 365 ml free water. Also 100 ml free water flushes q 8 hours for 665 ml free water total.  Now on propofol, 10.2 ml/hr, providing 269 kcal from lipids daily.  Per notes pt had 335 ml residuals last night, MD notified, TF not held.  Per RN continues with residuals less then 100 ml, not holding TF. Continues to tolerate TF well.     Diet Order:  NPO  Meds: Scheduled Meds:   . albuterol-ipratropium  6 puff Inhalation Q4H  . antiseptic oral rinse  15 mL Mouth Rinse QID  . aspirin  81 mg Per Tube Daily  . cefTRIAXone (ROCEPHIN)  IV  1 g Intravenous Q24H  . chlorhexidine  15 mL Mouth Rinse BID  . enoxaparin  40 mg Subcutaneous Q24H  . famotidine (PEPCID) IV  20 mg Intravenous Q12H  . free water  100 mL Per Tube Q8H  . haloperidol lactate  5 mg Intravenous Q6H  . insulin aspart  0-4 Units Subcutaneous Q4H  . mupirocin ointment  1 application Nasal BID  . vancomycin  1,500 mg Intravenous Q24H   Continuous Infusions:   . sodium chloride 20 mL/hr at 02/27/12 1300  . amiodarone (NEXTERONE PREMIX) 360 mg/200 mL dextrose 30 mg/hr (02/28/12 0414)  . dextrose 20 mL/hr at 02/27/12 0451  . dextrose 20 mL/hr at 02/27/12 0700  . feeding supplement (OSMOLITE 1.5 CAL) Stopped (02/27/12 0412)  . fentaNYL infusion INTRAVENOUS 100 mcg/hr (02/28/12 0500)  . norepinephrine (LEVOPHED) Adult infusion 2 mcg/min (02/28/12 0200)  . propofol 20 mcg/kg/min (02/27/12 2300)   PRN Meds:.sodium chloride, dextrose, fentaNYL  Labs:  CMP     Component Value Date/Time   NA 138 02/27/2012 0334   K 3.7 02/27/2012 0334   CL 99 02/27/2012  0334   CO2 27 02/27/2012 0334   GLUCOSE 110* 02/27/2012 0334   BUN 30* 02/27/2012 0334   CREATININE 2.00* 02/27/2012 0334   CALCIUM 9.2 02/27/2012 0334   PROT 6.8 02/23/2012 0500   ALBUMIN 3.4* 02/23/2012 0500   AST 200* 02/23/2012 0500   ALT 124* 02/23/2012 0500   ALKPHOS 176* 02/23/2012 0500   BILITOT 0.4 02/23/2012 0500   GFRNONAA 33* 02/27/2012 0334   GFRAA 38* 02/27/2012 0334     Intake/Output Summary (Last 24 hours) at 02/28/12 0901 Last data filed at 02/28/12 0700  Gross per 24 hour  Intake 2730.3 ml  Output   3555 ml  Net -824.7 ml    Weight Status:   143 lbs on admission Stable at 188 lbs x 3 days (6/22-24)  Down to 181 lbs 6/24   Re-estimated needs:  1998 kcal, 120-145 gm protein  Nutrition Dx:  Inadequate oral intake r/t inability to eat AEB NPO-ongoing   Goal:  Pt to met >/= 90% of estimated nutrition needs, unmet  Intervention:  Recommend: Osmolite 1.2 @ goal rate of 50 ml/hr, with 30 ml Pro-stat 4 times daily via tube.  This will provide 1840 kcal, 127 gm protein, and 984 ml free water.    Monitor:  Vent status, weight, labs, I/O's  Orson Slick MARIE Pager #:  [phone]

## 2012-02-28 NOTE — Procedures (Signed)
Bronchoscopy Procedure Note Jarette Pullum [id number] 10/12/1940  Procedure: Bronchoscopy Indications: Obtain specimens for culture and/or other diagnostic studies and Remove secretions and Also to reintubate patient due to kinkedprior et tube  Procedure Details Consent: Unable to obtain consent because of emergent medical necessity. Time Out: Verified patient identification, verified procedure, site/side was marked, verified correct patient position, special equipment/implants available, medications/allergies/relevent history reviewed, required imaging and test results available.  Performed  In preparation for procedure, patient was given 100% FiO2. Sedation: Muscle relaxants and previously on sedation  Airway entered and the following bronchi were examined: RUL, RLL and Bronchi.   Procedures performed: TACHEAL ASPIRATE Bronchoscope removed.  , Patient placed back on 100% FiO2 at conclusion of procedure.    Evaluation Hemodynamic Status: BP stable throughout; O2 sats: stable throughout Patient's Current Condition: stable Specimens:  TRACHEAL ASPIRATE THAT IS PURULEN Complications: No apparent complications Patient did tolerate procedure well.   Rmani Kapusta 02/28/2012

## 2012-02-28 NOTE — Progress Notes (Signed)
Patient's et tube advanced 2cm. Patient is now at 28cm. Xray pending

## 2012-02-28 NOTE — Progress Notes (Addendum)
Name: Logan White MRN: [id number] DOB: July 18, 1941    LOS: 6 Date of admit 02/22/2012 10:36 PM   Referring Provider:  Alvino Chapel Reason for Referral:  Cardiac arrest  PULMONARY / CRITICAL CARE MEDICINE  PROFILE:  This is a 71 y/o male with CAD admitted 02/22/2012 after cardiac arrest. Initial rhythm VF. Defib X 2 by EMS in field. Duration of pulselessness > 12 minutes. Underwent [place] protocol (completed 6/21 PM). Did not undergo cath   Events Since Admission: 6/19 Head CT >>NAD 6/23 Severe agiotation - on fent and diprivan 6/24: still severe agitation, still on pressors. abx added. On amio gtt   Current Status:   6/25: Still with sever agitation On WUA RASS +4. Opening eyes but not tracking or following commands. Grandaughter concerned about slow pace of improvement - wants EEG. Off pressors  6/25: Et tube changed due to kink   Vital Signs: Temp:  [96.4 F (35.8 C)-100.1 F (37.8 C)] 100.1 F (37.8 C) (06/25 0820) Pulse Rate:  [83-112] 112  (06/25 0824) Resp:  [16-19] 16  (06/25 1000) BP: (86-128)/(40-90) 118/68 mmHg (06/25 1000) SpO2:  [92 %-100 %] 100 % (06/25 0900) FiO2 (%):  [40 %] 40 % (06/25 1000)  Physical Examination: Gen: sedated on vent HEENT: NCAT, PERRL, ETT in place PULM: scattered crackles CV: RRR s M AB: BS+, soft, nontender, no hsm Ext: very cold feet with pulses not palpable Neuro RASS +4 on wua  Neuro:   Intake/Output Summary (Last 24 hours) at 02/28/12 1045 Last data filed at 02/28/12 1000  Gross per 24 hour  Intake 2890.3 ml  Output   3905 ml  Net -1014.7 ml   Principal Problem:  *Cardiac arrest Active Problems:  Atrial fibrillation with rapid ventricular response  Acute respiratory failure with hypoxia  Ventricular fibrillation  Cardiogenic shock  Acute renal failure  Anoxic encephalopathy  Septic shock  CKD (chronic kidney disease)  DM (diabetes mellitus)  Ischemic cardiomyopathy   ASSESSMENT AND  PLAN  PULMONARY  Lab 02/25/12 0244 02/24/12 0431 02/23/12 0839 02/23/12 0517 02/23/12 0140  PHART 7.392 7.507* 7.298* 7.208* 7.225*  PCO2ART 43.0 30.8* 34.1* 50.1* 45.0  PO2ART 91.0 74.5* 69.0* 79.4* 93.3  HCO3 25.6* 24.2* 17.5* 20.6 19.2*  O2SAT 96.5 97.0 95.0 94.9 96.4   Ventilator Settings: Vent Mode:  [-] PRVC FiO2 (%):  [40 %] 40 % Set Rate:  [16 bmp] 16 bmp Vt Set:  [620 mL] 620 mL PEEP:  [5 cmH20] 5 cmH20 Plateau Pressure:  [10 cmH20-22 cmH20] 10 cmH20 CXR:  CM ,edema pattern  ETT:  6/19 >>  A:  Acute respiratory failure due to cardiac arrest; smoker, no known COPD  -on 6/25: does not meet sbt criteria due d agitation (otherwise on 40% fio2 and meets  P:   -Cont full vent with sedation gtt -Cont dailyWUA/SBT    CARDIOVASCULAR  Lab 02/26/12 0415 02/23/12 1646 02/23/12 1100 02/23/12 0035 02/22/12 2317 02/22/12 2316  TROPONINI -- <0.30 <0.30 <0.30 <0.30 --  LATICACIDVEN -- -- -- 1.6 -- 8.7*  PROBNP 11188.0* -- -- -- -- --   ECG:   Echo 6/21: LVEF 30%. Diffuse HK, moderate MR, mild PAH Lines: 6/19 R IJ CVL >>   A:  1)Cardiac arrest due to V-fib in setting of profound hypokalemia.  But possible STREP PNEUMO and H FLUplayed a role 2) Atrial fibrillation with RVR - resolved 3) History of CHF, Afib, HTN, CAD, Cerebrovascular disease 4) Shock post arrest - likely cardiogenic and septc   -  on 6/25: off pressors P:  -goal MAP > 65 mmHg - begin diuresis 6/25 with one dose  RENAL  Lab 02/27/12 0334 02/26/12 0415 02/25/12 0245 02/24/12 1305 02/24/12 0837 02/24/12 0455 02/23/12 0035  NA 138 137 135 139 139 -- --  K 3.7 4.7 -- -- -- -- --  CL 99 101 98 101 101 -- --  CO2 27 24 23 27 27  -- --  BUN 30* 27* 27* 20 20 -- --  CREATININE 2.00* 1.65* 1.68* 1.27 1.19 -- --  CALCIUM 9.2 8.4 7.5* 7.6* 7.6* -- --  MG -- -- 1.6 -- -- 1.3* 1.7  PHOS -- -- 4.6 -- -- 3.0 --   Intake/Output      06/24 0701 - 06/25 0700 06/25 0701 - 06/26 0700   I.V. (mL/kg) 1582 (19.2) 185.7  (2.3)   NG/GT 700 60   IV Piggyback 650    Total Intake(mL/kg) 2932 (35.5) 245.7 (3)   Urine (mL/kg/hr) 3555 (1.8) 350   Emesis/NG output     Total Output 3555 350   Net -623 -104.3         Foley:  6/19  A:   1) CKD, baseline Cr in last year is 1.2-2.1. 2) Mild acute renal insuff getting worse 6/24 and 6/25   P:   - give one dose lasix 6/25  - check bnp 6/26 and decide more diuresis  GASTROINTESTINAL  Lab 02/23/12 0500 02/22/12 2310  AST 200* 122*  ALT 124* 83*  ALKPHOS 176* 157*  BILITOT 0.4 0.3  PROT 6.8 6.5  ALBUMIN 3.4* 3.3*    A: Elevated LFTs - likely shock and/or congestion.  P:   -Monitor LFTs intermittently   HEMATOLOGIC  Lab 02/27/12 0334 02/26/12 0415 02/25/12 0245 02/24/12 0455 02/23/12 0500 02/22/12 2310  HGB 12.9* 13.5 13.8 14.0 15.2 --  HCT 36.2* 37.8* 38.4* 38.7* 42.7 --  PLT 156 138* 167 164 224 --  INR -- -- -- -- -- 1.33  APTT -- -- -- -- -- 30   A:  No acute issues P:  - - PRBC for hgb </= 6.9gm%    - exceptions are   -  if ACS susepcted/confirmed then transfuse for hgb </= 8.0gm%,  or    -  If septic shock first 24h and scvo2 < 70% then transfuse for hgb </= 9.0gm%   - active bleeding with hemodynamic instability, then transfuse regardless of hemoglobin value   At at all times try to transfuse 1 unit prbc as possible with exception of active hemorrhage    INFECTIOUS  Lab 02/27/12 0334 02/26/12 0415 02/25/12 0245 02/24/12 0455 02/23/12 0500 02/22/12 2317  WBC 9.5 7.2 12.0* 5.6 11.5* --  PROCALCITON -- 13.17 -- -- -- <0.10   Cultures: Blood X 2 6/22 >>  Resp 6/22 >>  Urine 6/22 >>   .... BRONCH ET TUBE THICK GREEN PURULENT SECRETION 6/25 >>  Results for orders placed during the hospital encounter of 02/22/12  CULTURE, BLOOD (ROUTINE X 2)     Status: Normal (Preliminary result)   Collection Time   02/22/12 11:03 PM      Component Value Range Status Comment   Specimen Description BLOOD RIGHT HAND   Final    Special Requests  BOTTLES DRAWN AEROBIC AND ANAEROBIC [facility]   Final    Culture  Setup Time [id number]   Final    Culture     Final    Value:  BLOOD CULTURE RECEIVED NO GROWTH TO DATE CULTURE WILL BE HELD FOR 5 DAYS BEFORE ISSUING A FINAL NEGATIVE REPORT   Report Status PENDING   Incomplete   CULTURE, BLOOD (ROUTINE X 2)     Status: Normal (Preliminary result)   Collection Time   02/22/12 11:08 PM      Component Value Range Status Comment   Specimen Description BLOOD LEFT HAND   Final    Special Requests BOTTLES DRAWN AEROBIC ONLY 5CC    Final    Culture  Setup Time [id number]   Final    Culture     Final    Value:        BLOOD CULTURE RECEIVED NO GROWTH TO DATE CULTURE WILL BE HELD FOR 5 DAYS BEFORE ISSUING A FINAL NEGATIVE REPORT   Report Status PENDING   Incomplete   URINE CULTURE     Status: Normal   Collection Time   02/22/12 11:40 PM      Component Value Range Status Comment   Specimen Description URINE, CATHETERIZED   Final    Special Requests NONE   Final    Culture  Setup Time [id number]   Final    Colony Count NO GROWTH   Final    Culture NO GROWTH   Final    Report Status 02/24/2012 FINAL   Final   MRSA PCR SCREENING     Status: Abnormal   Collection Time   02/23/12  1:07 AM      Component Value Range Status Comment   MRSA by PCR POSITIVE (*) NEGATIVE Final   CULTURE, RESPIRATORY     Status: Normal   Collection Time   02/25/12 12:45 PM      Component Value Range Status Comment   Specimen Description TRACHEAL ASPIRATE   Final    Special Requests NONE   Final    Gram Stain     Final    Value: ABUNDANT WBC PRESENT, PREDOMINANTLY PMN     ABUNDANT GRAM NEGATIVE COCCOBACILLI     MODERATE GRAM POSITIVE COCCI IN PAIRS   Culture     Final    Value: MODERATE STREPTOCOCCUS PNEUMONIAE     ABUNDANT HAEMOPHILUS INFLUENZAE     Note: BETA LACTAMASE NEGATIVE   Report Status 02/28/2012 FINAL   Final    Organism ID, Bacteria STREPTOCOCCUS PNEUMONIAE   Final   CULTURE, BLOOD (ROUTINE X  2)     Status: Normal (Preliminary result)   Collection Time   02/25/12  1:04 PM      Component Value Range Status Comment   Specimen Description BLOOD RIGHT HAND   Final    Special Requests BOTTLES DRAWN AEROBIC AND ANAEROBIC 10CC   Final    Culture  Setup Time [id number]   Final    Culture     Final    Value:        BLOOD CULTURE RECEIVED NO GROWTH TO DATE CULTURE WILL BE HELD FOR 5 DAYS BEFORE ISSUING A FINAL NEGATIVE REPORT   Report Status PENDING   Incomplete   CULTURE, BLOOD (ROUTINE X 2)     Status: Normal (Preliminary result)   Collection Time   02/25/12  1:18 PM      Component Value Range Status Comment   Specimen Description BLOOD LEFT HAND   Final    Special Requests BOTTLES DRAWN AEROBIC ONLY [facility]   Final    Culture  Setup Time [id number]   Final    Culture  Final    Value:        BLOOD CULTURE RECEIVED NO GROWTH TO DATE CULTURE WILL BE HELD FOR 5 DAYS BEFORE ISSUING A FINAL NEGATIVE REPORT   Report Status PENDING   Incomplete   CULTURE, RESPIRATORY     Status: Normal (Preliminary result)   Collection Time   02/26/12  6:57 AM      Component Value Range Status Comment   Specimen Description TRACHEAL ASPIRATE   Final    Special Requests NONE   Final    Gram Stain     Final    Value: MODERATE WBC PRESENT,BOTH PMN AND MONONUCLEAR     NO SQUAMOUS EPITHELIAL CELLS SEEN     NO ORGANISMS SEEN   Culture Culture reincubated for better growth   Final    Report Status PENDING   Incomplete      Antibiotics: Anti-infectives     Start     Dose/Rate Route Frequency Ordered Stop   02/27/12 1500   vancomycin (VANCOCIN) 1,500 mg in sodium chloride 0.9 % 500 mL IVPB        1,500 mg 250 mL/hr over 120 Minutes Intravenous Every 24 hours 02/27/12 1442     02/25/12 1100   cefTRIAXone (ROCEPHIN) 1 g in dextrose 5 % 50 mL IVPB        1 g 100 mL/hr over 30 Minutes Intravenous Every 24 hours 02/25/12 1012             A:  6/22 Sputum culture Strep Pneump and H Flu ( MRSA PCR  POSITIVE)- likely primary cause of cardiac arrest On 6/24 - hypothermiic and still pn pressors -septic shck.  On 6/25 - off pressors. Bronch with thick green purulent secretion via et tube P:   - Continue antibiotics Vanc  And change  CEftriazone to zosyn due to purulent secretions via et  - Will need to taper   ENDOCRINE  Lab 02/28/12 0823 02/28/12 0319 02/27/12 2338 02/27/12 1541 02/27/12 1205  GLUCAP 104* 102* 117* 99 113*   A:  History of glucose intolerance  Episodes of hypoglycemia P:   -Begin TFs - Cont SSI  NEUROLOGIC  A:   - on 6/24: RASS +4 on WUA. ? Followed x 1 command. Needing continuous sedation P:   fent gtt  diprivan gtt Daily wua Get EEG (family keen on it)   BEST PRACTICE / DISPOSITION Level of Care:  ICU Primary Service: PCCM Consultants:  Interventional Cardiology [name]), [facility] Cardiology Code Status:  Full Diet:  TFs DVT Px:  lovenox GI Px:  pepcid Social / Family:  Wife and grandaugther updated 6/25. Informed recovery is uncertain and could take weeks or months   The patient is critically ill with multiple organ systems failure and requires high complexity decision making for assessment and support, frequent evaluation and titration of therapies, application of advanced monitoring technologies and extensive interpretation of multiple databases.   Critical Care Time devoted to patient care services described in this note is  35  Minutes.  Dr. Brand Males, M.D., [facility].C.P Pulmonary and Critical Care Medicine Staff Physician [facility] Pager: [phone], If no answer or between  15:00h - 7:00h: call [phone]  02/28/2012 10:45 AM

## 2012-02-28 NOTE — Procedures (Signed)
Intubation Procedure Note Johandry Longden [id number] [birth year]  Procedure: Intubation Indications: ETT needed to be changed - prior et tube kinked in mouth and was 8cm-13cm above carina despite manipulation. So bronchoscope used. Copious purulent secretions obtained and will be sent for BAL  Procedure Details Consent: Unable to obtain consent because of emergent medical necessity. Time Out: Verified patient identification, verified procedure, site/side was marked, verified correct patient position, special equipment/implants available, medications/allergies/relevent history reviewed, required imaging and test results available.  Performed  Maximum sterile technique was used including cap, gloves, gown, hand hygiene and mask.  MAC    Evaluation Hemodynamic Status: BP stable throughout; O2 sats: stable throughout Patient's Current Condition: stable Complications: No apparent complications Patient did tolerate procedure well. Chest X-ray ordered to verify placement.  CXR: not required.   Reid Nawrot 02/28/2012

## 2012-02-29 ENCOUNTER — Inpatient Hospital Stay (HOSPITAL_COMMUNITY): Payer: Medicare Other

## 2012-02-29 LAB — BASIC METABOLIC PANEL
BUN: 20 mg/dL (ref 6–23)
BUN: 19 mg/dL (ref 6–23)
Chloride: 101 mEq/L (ref 96–112)
Chloride: 103 mEq/L (ref 96–112)
GFR calc Af Amer: 47 mL/min — ABNORMAL LOW (ref >90)
Glucose, Bld: 108 mg/dL — ABNORMAL HIGH (ref 70–99)
Glucose, Bld: 115 mg/dL — ABNORMAL HIGH (ref 70–99)
Potassium: 2.4 mEq/L — CL (ref 3.5–5.1)
Potassium: 3.6 mEq/L (ref 3.5–5.1)
Sodium: 145 mEq/L (ref 135–145)
Sodium: 144 mEq/L (ref 135–145)

## 2012-02-29 LAB — CULTURE, BLOOD (ROUTINE X 2)
Culture  Setup Time: 201306200511
Culture: NO GROWTH

## 2012-02-29 LAB — CBC
HCT: 31.5 % — ABNORMAL LOW (ref 39.0–52.0)
Hemoglobin: 11.3 g/dL — ABNORMAL LOW (ref 13.0–17.0)
RBC: 4.43 MIL/uL (ref 4.22–5.81)
WBC: 7.1 10*3/uL (ref 4.0–10.5)

## 2012-02-29 LAB — GLUCOSE, CAPILLARY
Glucose-Capillary: 104 mg/dL — ABNORMAL HIGH (ref 70–99)
Glucose-Capillary: 112 mg/dL — ABNORMAL HIGH (ref 70–99)
Glucose-Capillary: 115 mg/dL — ABNORMAL HIGH (ref 70–99)

## 2012-02-29 LAB — CREATININE, SERUM: GFR calc non Af Amer: 37 mL/min — ABNORMAL LOW (ref >90)

## 2012-02-29 MED ORDER — HALOPERIDOL LACTATE 5 MG/ML IJ SOLN
INTRAMUSCULAR | Status: AC
  Filled 2012-02-29: qty 1

## 2012-02-29 MED ORDER — POTASSIUM CHLORIDE 20 MEQ/15ML (10%) PO LIQD
40.0000 meq | Freq: Three times a day (TID) | ORAL | Status: AC
  Administered 2012-02-29 (×3): 40 meq
  Filled 2012-02-29 (×2): qty 30

## 2012-02-29 MED ORDER — FAMOTIDINE 40 MG/5ML PO SUSR
40.0000 mg | Freq: Every day | ORAL | Status: DC
  Administered 2012-02-29 – 2012-03-04 (×5): 40 mg via ORAL
  Filled 2012-02-29 (×7): qty 5

## 2012-02-29 MED ORDER — POTASSIUM CHLORIDE 10 MEQ/50ML IV SOLN
10.0000 meq | INTRAVENOUS | Status: AC
  Administered 2012-02-29 (×4): 10 meq via INTRAVENOUS
  Filled 2012-02-29 (×4): qty 50

## 2012-02-29 MED ORDER — HALOPERIDOL LACTATE 5 MG/ML IJ SOLN: 4.0000 mg | INTRAMUSCULAR | Status: DC | PRN

## 2012-02-29 MED ORDER — POTASSIUM CHLORIDE 20 MEQ/15ML (10%) PO LIQD
ORAL | Status: AC
  Filled 2012-02-29: qty 30

## 2012-02-29 MED ORDER — FENTANYL CITRATE 0.05 MG/ML IJ SOLN
25.0000 ug | INTRAMUSCULAR | Status: DC | PRN
  Administered 2012-03-01: 100 ug via INTRAVENOUS
  Administered 2012-03-01: 50 ug via INTRAVENOUS
  Administered 2012-03-02 – 2012-03-05 (×7): 100 ug via INTRAVENOUS
  Filled 2012-02-29 (×9): qty 2

## 2012-02-29 MED ORDER — SODIUM CHLORIDE 0.9 % IV SOLN
INTRAVENOUS | Status: DC
  Administered 2012-02-28: 10 mL/h via INTRAVENOUS
  Administered 2012-02-29: 14:00:00 via INTRAVENOUS

## 2012-02-29 NOTE — Progress Notes (Signed)
Name: Logan White MRN: [id number] DOB: 29-Dec-1940    LOS: 7  Referring Provider:  Alvino Chapel Reason for Referral:  Cardiac arrest  PULMONARY / CRITICAL CARE MEDICINE  PROFILE:  This is a 71 y/o male with CAD admitted 02/22/2012 after cardiac arrest. Initial rhythm VF. Defib X 2 by EMS in field. Duration of pulselessness > 12 minutes. Underwent [place] protocol (completed 6/21 PM). Did not undergo cath   Events Since Admission: 6/19 Head CT >>NAD 6/23 Severe agitation - on fent and diprivan 6/25 ETT changed, bronch, high residuals on TFs  Current Status:   agiation this am , back On fent and diprivan Off pressors RN not sure if he followed commands briefly Still on amio gtt   Vital Signs: Temp:  [98.3 F (36.8 C)-99.8 F (37.7 C)] 98.3 F (36.8 C) (06/26 0800) Pulse Rate:  [88-101] 88  (06/26 0819) Resp:  [16-17] 16  (06/26 0900) BP: (81-128)/(43-79) 84/66 mmHg (06/26 0900) SpO2:  [96 %-100 %] 99 % (06/26 0900) FiO2 (%):  [40 %] 40 % (06/26 0826) Weight:  [80 kg (176 lb 5.9 oz)] 80 kg (176 lb 5.9 oz) (06/26 0500)  Physical Examination: Gen: sedated on vent HEENT: NCAT, PERRL, ETT in place PULM: diffuse rhonchi CV: RRR s M AB: BS+, soft, nontender, no hsm Ext: very cold feet with pulses not palpable Neuro RASS +4 on wua  Neuro:   Intake/Output Summary (Last 24 hours) at 02/29/12 0911 Last data filed at 02/29/12 0900  Gross per 24 hour  Intake 2900.34 ml  Output   3100 ml  Net -199.66 ml   Principal Problem:  *Cardiac arrest Active Problems:  Atrial fibrillation with rapid ventricular response  Acute respiratory failure with hypoxia  CKD (chronic kidney disease)  Ventricular fibrillation  DM (diabetes mellitus)  Cardiogenic shock  Acute renal failure  Anoxic encephalopathy  Ischemic cardiomyopathy  Septic shock   ASSESSMENT AND PLAN  PULMONARY  Lab 02/25/12 0244 02/24/12 0431 02/23/12 0839 02/23/12 0517 02/23/12 0140  PHART 7.392 7.507*  7.298* 7.208* 7.225*  PCO2ART 43.0 30.8* 34.1* 50.1* 45.0  PO2ART 91.0 74.5* 69.0* 79.4* 93.3  HCO3 25.6* 24.2* 17.5* 20.6 19.2*  O2SAT 96.5 97.0 95.0 94.9 96.4   Ventilator Settings: Vent Mode:  [-] PRVC FiO2 (%):  [40 %] 40 % Set Rate:  [16 bmp] 16 bmp Vt Set:  [620 mL] 620 mL PEEP:  [5 cmH20] 5 cmH20 Plateau Pressure:  [18 cmH20-20 cmH20] 18 cmH20 CXR:  CM ,edema pattern with bibasal ASD l >>r ETT:  6/19 >>  A:  Acute respiratory failure due to cardiac arrest; smoker, no known COPD  -on 6/24: does not meet sbt criteria due to shock and agitation P:   -SBTs . -Cont dailyWUA -Lighten sedation as tolerated but needing sedation gtt on 6/26 - trial of haldol prn   CARDIOVASCULAR  Lab 02/29/12 0530 02/26/12 0415 02/23/12 1646 02/23/12 1100 02/23/12 0035 02/22/12 2317 02/22/12 2316  TROPONINI -- -- <0.30 <0.30 <0.30 <0.30 --  LATICACIDVEN -- -- -- -- 1.6 -- 8.7*  PROBNP 8413.0* 11188.0* -- -- -- -- --   ECG:   Echo 6/21: LVEF 30%. Diffuse HK, moderate MR, mild PAH Lines: 6/19 R IJ CVL >>   A:  1)Cardiac arrest due to V-fib in setting of profound hypokalemia.   2) Atrial fibrillation with RVR - resolved 3) History of CHF, Afib, HTN, CAD, Cerebrovascular disease 4) Shock post arrest - likely cardiogenic P:  -Wean NE for MAP >  65 mmHg  RENAL  Lab 02/29/12 0530 02/27/12 0334 02/26/12 0415 02/25/12 0245 02/24/12 1305 02/24/12 0837 02/24/12 0455 02/23/12 0035  NA -- 138 137 135 139 139 -- --  K -- 3.7 4.7 -- -- -- -- --  CL -- 99 101 98 101 101 -- --  CO2 -- 27 24 23 27 27  -- --  BUN -- 30* 27* 27* 20 20 -- --  CREATININE 1.81* 2.00* 1.65* 1.68* 1.27 -- -- --  CALCIUM -- 9.2 8.4 7.5* 7.6* 7.6* -- --  MG -- -- -- 1.6 -- -- 1.3* 1.7  PHOS -- -- -- 4.6 -- -- 3.0 --   Intake/Output      06/25 0701 - 06/26 0700 06/26 0701 - 06/27 0700   I.V. (mL/kg) 1647.4 (20.6) 144.2 (1.8)   NG/GT 520 40   IV Piggyback 700 12.5   Total Intake(mL/kg) 2867.4 (35.8) 196.7 (2.5)   Urine  (mL/kg/hr) 3300 (1.7) 150   Total Output 3300 150   Net -432.6 +46.7         Foley:  6/19  A:   1) CKD, baseline Cr in last year is 1.2-2.1. 2) Mild acute renal insuff getting worse 6/24   P:   -KVO IVF. -Monitor  GASTROINTESTINAL  Lab 02/23/12 0500 02/22/12 2310  AST 200* 122*  ALT 124* 83*  ALKPHOS 176* 157*  BILITOT 0.4 0.3  PROT 6.8 6.5  ALBUMIN 3.4* 3.3*    A: Elevated LFTs - likely shock and/or congestion.  P:   -Monitor LFTs intermittently   HEMATOLOGIC  Lab 02/27/12 0334 02/26/12 0415 02/25/12 0245 02/24/12 0455 02/23/12 0500 02/22/12 2310  HGB 12.9* 13.5 13.8 14.0 15.2 --  HCT 36.2* 37.8* 38.4* 38.7* 42.7 --  PLT 156 138* 167 164 224 --  INR -- -- -- -- -- 1.33  APTT -- -- -- -- -- 30   A:  No acute issues P:  - aim for Hb 7 or above    INFECTIOUS  Lab 02/27/12 0334 02/26/12 0415 02/25/12 0245 02/24/12 0455 02/23/12 0500 02/22/12 2317  WBC 9.5 7.2 12.0* 5.6 11.5* --  PROCALCITON -- 13.17 -- -- -- <0.10   Cultures: Blood X 2 6/22 >> ng Resp 6/22 >> strep, hemophilus Urine 6/22 >>   Ceftriaxone 6/22 >>6/25 Zosyn 6/25 >>  A:  Low grade fever 6/22, purulent ET secretions - likely purulent tracheobronchitis  P:    - PCT 6/23 AM - 13.17 - dc vancomycin  ENDOCRINE  Lab 02/29/12 0746 02/29/12 0341 02/28/12 2306 02/28/12 1909 02/28/12 1614  GLUCAP 104* 115* 104* 92 95   A:  History of glucose intolerance  Episodes of hypoglycemia P:   -Begin TFs - Cont SSI  NEUROLOGIC  A:   - on 6/24: ? Followed x 1 command.  Head CT neg 6/20 eeg 6/25 slowing  P:   fent gtt  diprivan gtt Add haldol prn if qTc ok Daily [place] / DISPOSITION Level of Care:  ICU Primary Service: PCCM Consultants:  Interventional Cardiology [name]), [facility] Cardiology Code Status:  Full Diet:  TFs DVT Px:  lovenox GI Px:  pepcid Social / Family: Updated grand-d Danae Chen 6/26. Will get social work consult  The patient is critically ill with  multiple organ systems failure and requires high complexity decision making for assessment and support, frequent evaluation and titration of therapies, application of advanced monitoring technologies and extensive interpretation of multiple databases.   Critical Care Time devoted  to patient care services described in this note is  35  Minutes.  Kara Mead MD. [place] Flood. [place] [facility] Pager [phone] If no response call [phone]    02/29/2012 9:11 AM

## 2012-02-29 NOTE — Procedures (Signed)
EEG NUMBER:  [id number].  This routine EEG was requested in this 71 year old man who is status post cardiac arrest.  He has had problems with agitation.  List of medications include Haldol, propofol, and Sublimaze.  The EEG was done with the patient both awake and drowsy.  During periods of maximal wakefulness, background activities were composed mainly of higher frequency theta activities.  These were posterior dominant.  There was no clear reactivity to eye opening.  Photic stimulation did not produce a driving response.  Hyperventilation was not performed.  Most of the tracing was spent in a drowsy state.  This was characterized by diffuse poorly organized low-amplitude theta and delta activities with bursts of diffuse low-amplitude rhythmic alpha activities.  No normal activities of sleep were seen.  CLINICAL INTERPRETATION:  This routine EEG done with the patient awake and drowsy is abnormal.  Background activities during wakefulness are too slow suggesting a mild to moderate encephalopathy of nonspecific etiology.  No interictal epileptiform discharges or electrographic seizures were seen.          ______________________________ Neena Rhymes, MD    [id number] D:  02/29/2012 08:00:32  T:  02/29/2012 08:13:10  Job #:  [id number]

## 2012-02-29 NOTE — Progress Notes (Signed)
Replaced K aggerssively IV & PO & rechk in pm Chk mag level  Charnetta Wulff V.

## 2012-03-01 ENCOUNTER — Inpatient Hospital Stay (HOSPITAL_COMMUNITY): Payer: Medicare Other

## 2012-03-01 LAB — GLUCOSE, CAPILLARY
Glucose-Capillary: 123 mg/dL — ABNORMAL HIGH (ref 70–99)
Glucose-Capillary: 92 mg/dL (ref 70–99)
Glucose-Capillary: 126 mg/dL — ABNORMAL HIGH (ref 70–99)
Glucose-Capillary: 110 mg/dL — ABNORMAL HIGH (ref 70–99)
Glucose-Capillary: 83 mg/dL (ref 70–99)

## 2012-03-01 LAB — BASIC METABOLIC PANEL
CO2: 30 mEq/L (ref 19–32)
Calcium: 8.6 mg/dL (ref 8.4–10.5)
GFR calc non Af Amer: 42 mL/min — ABNORMAL LOW (ref >90)
Glucose, Bld: 125 mg/dL — ABNORMAL HIGH (ref 70–99)
Potassium: 3.1 mEq/L — ABNORMAL LOW (ref 3.5–5.1)
Sodium: 142 mEq/L (ref 135–145)

## 2012-03-01 LAB — CBC
Hemoglobin: 11.4 g/dL — ABNORMAL LOW (ref 13.0–17.0)
MCH: 25.4 pg — ABNORMAL LOW (ref 26.0–34.0)
Platelets: 145 10*3/uL — ABNORMAL LOW (ref 150–400)
RBC: 4.48 MIL/uL (ref 4.22–5.81)
WBC: 8 10*3/uL (ref 4.0–10.5)

## 2012-03-01 MED ORDER — POTASSIUM CHLORIDE 10 MEQ/50ML IV SOLN
10.0000 meq | INTRAVENOUS | Status: AC
  Administered 2012-03-01 (×4): 10 meq via INTRAVENOUS
  Filled 2012-03-01: qty 200

## 2012-03-01 NOTE — Progress Notes (Signed)
Name: Logan White MRN: [id number] DOB: 02-16-1941    LOS: 8  Referring Provider:  Alvino Chapel Reason for Referral:  Cardiac arrest  PULMONARY / CRITICAL CARE MEDICINE  PROFILE:  This is a 71 y/o male with CAD admitted 02/22/2012 after cardiac arrest. Initial rhythm VF. Defib X 2 by EMS in field. Duration of pulselessness > 12 minutes. Underwent [place] Sun protocol (completed 6/21 PM). Did not undergo cath   Events Since Admission: 6/19 Head CT >>NAD 6/23 Severe agitation - on fent and diprivan 6/25 ETT changed, bronch, high residuals on TFs 6/26  agiation , back On fent and diprivan, Off pressors Current Status:  Vomiting x1, high RR on SBT,   followed commands briefly off sedation  Still on amio gtt   Vital Signs: Temp:  [97.9 F (36.6 C)-99 F (37.2 C)] 99 F (37.2 C) (06/27 0500) Pulse Rate:  [76-104] 76  (06/27 0847) Resp:  [15-17] 16  (06/27 0700) BP: (83-146)/(58-117) 146/117 mmHg (06/27 0847) SpO2:  [97 %-100 %] 97 % (06/27 0847) FiO2 (%):  [40 %] 40 % (06/27 0847) Weight:  [80.1 kg (176 lb 9.4 oz)] 80.1 kg (176 lb 9.4 oz) (06/27 0400)  Physical Examination: Gen: agitated on vent HEENT: NCAT, PERRL, ETT in place PULM: diffuse rhonchi CV: RRR s M AB: BS+, soft, nontender, no hsm Ext: very cold feet with pulses not palpable Neuro RASS +2 on wua    Intake/Output Summary (Last 24 hours) at 03/01/12 0901 Last data filed at 03/01/12 0700  Gross per 24 hour  Intake 2277.53 ml  Output    770 ml  Net 1507.53 ml   Principal Problem:  *Cardiac arrest Active Problems:  Atrial fibrillation with rapid ventricular response  Acute respiratory failure with hypoxia  CKD (chronic kidney disease)  Ventricular fibrillation  DM (diabetes mellitus)  Cardiogenic shock  Acute renal failure  Anoxic encephalopathy  Ischemic cardiomyopathy  Septic shock   ASSESSMENT AND PLAN  PULMONARY  Lab 02/25/12 0244 02/24/12 0431  PHART 7.392 7.507*  PCO2ART 43.0 30.8*    PO2ART 91.0 74.5*  HCO3 25.6* 24.2*  O2SAT 96.5 97.0   Ventilator Settings: Vent Mode:  [-] PRVC FiO2 (%):  [40 %] 40 % Set Rate:  [16 bmp] 16 bmp Vt Set:  [620 mL] 620 mL PEEP:  [5 cmH20] 5 cmH20 Plateau Pressure:  [19 cmH20-21 cmH20] 19 cmH20  CXR:  CM ,edema pattern with bibasal ASD l >>r ETT:  6/19 >>  A:  Acute respiratory failure due to cardiac arrest; smoker, suspect underlying COPD  -on 6/24: does not meet sbt criteria due to shock and agitation P:   -SBTs . -Rx pneumonia -ct combivent   CARDIOVASCULAR  Lab 02/29/12 0530 02/26/12 0415 02/23/12 1646 02/23/12 1100  TROPONINI -- -- <0.30 <0.30  LATICACIDVEN -- -- -- --  PROBNP 8413.0* 11188.0* -- --   ECG:   Echo 6/21: LVEF 30%. Diffuse HK, moderate MR, mild PAH Lines: 6/19 R IJ CVL >>   A:  1)Cardiac arrest due to V-fib in setting of profound hypokalemia.   2) Atrial fibrillation with RVR - resolved 3) History of CHF, Afib, HTN, CAD, Cerebrovascular disease 4) Shock post arrest - likely cardiogenic P:  -off pressors -cardiac cath/ ICD in future if recovery -amio gtt per cards ? Change to PO, ok to use low dose BB  RENAL  Lab 03/01/12 0327 02/29/12 1700 02/29/12 1226 02/29/12 1009 02/29/12 0530 02/27/12 0334 02/26/12 0415 02/25/12 0245 02/24/12 0455  NA 142  144 -- 145 -- 138 137 -- --  K 3.1* 3.6 -- -- -- -- -- -- --  CL 102 103 -- 101 -- 99 101 -- --  CO2 30 31 -- 33* -- 27 24 -- --  BUN 20 19 -- 20 -- 30* 27* -- --  CREATININE 1.61* 1.66* -- 1.74* 1.81* 2.00* -- -- --  CALCIUM 8.6 8.5 -- 8.6 -- 9.2 8.4 -- --  MG -- -- 1.8 -- -- -- -- 1.6 1.3*  PHOS -- -- -- -- -- -- -- 4.6 3.0   Intake/Output      06/26 0701 - 06/27 0700 06/27 0701 - 06/28 0700   I.V. (mL/kg) 1294.2 (16.2)    NG/GT 830    IV Piggyback 350    Total Intake(mL/kg) 2474.2 (30.9)    Urine (mL/kg/hr) 920 (0.5)    Total Output 920    Net +1554.2          Foley:  6/19  A:   1) CKD, baseline Cr in last year is 1.2-2.1. 2) Mild  acute renal insuff getting worse 6/24 3) hypokalemia   P:   -KVO IVF. -repelte K, Mg ok  GASTROINTESTINAL No results found for this basename: AST:5,ALT:5,ALKPHOS:5,BILITOT:5,PROT:5,ALBUMIN:5 in the last 168 hours  A: Elevated LFTs - likely shock and/or congestion.  P:   -Monitor LFTs intermittently   HEMATOLOGIC  Lab 03/01/12 0327 02/29/12 1009 02/27/12 0334 02/26/12 0415 02/25/12 0245  HGB 11.4* 11.3* 12.9* 13.5 13.8  HCT 31.9* 31.5* 36.2* 37.8* 38.4*  PLT 145* 136* 156 138* 167  INR -- -- -- -- --  APTT -- -- -- -- --   A:  No acute issues P:  - aim for Hb 7 or above    INFECTIOUS  Lab 03/01/12 0327 02/29/12 1009 02/27/12 0334 02/26/12 0415 02/25/12 0245  WBC 8.0 7.1 9.5 7.2 12.0*  PROCALCITON -- -- -- 13.17 --   Cultures: Blood X 2 6/22 >> ng Resp 6/22 >> strep pneumonia, hemophilus Urine 6/22 >>   Ceftriaxone 6/22 >>6/25 Zosyn 6/25 >>  A:  Low grade fever 6/22, purulent ET secretions - likely purulent tracheobronchitis  P:    - PCT 6/23 AM - 13.17 - dc vancomycin  ENDOCRINE  Lab 03/01/12 0822 03/01/12 0305 02/29/12 2356 02/29/12 2050 02/29/12 1556  GLUCAP 123* 129* 115* 115* 112*   A:  History of glucose intolerance  Episodes of hypoglycemia P:   -ct TFs - Cont SSI  NEUROLOGIC  A:   - on 6/24: ? Followed x 1 command.  Head CT neg 6/20 eeg 6/25 slowing  P:   fent prn,  haldol prn if qTc ok  diprivan gtt to rest Daily [place] / DISPOSITION Level of Care:  ICU Primary Service: PCCM Consultants:  Interventional Cardiology [name]), [facility] Cardiology Code Status:  Full Diet:  TFs DVT Px:  lovenox GI Px:  pepcid Social / Family: Updated grand-d Danae Chen 6/27.   The patient is critically ill with multiple organ systems failure and requires high complexity decision making for assessment and support, frequent evaluation and titration of therapies, application of advanced monitoring technologies and extensive interpretation of  multiple databases.   Critical Care Time devoted to patient care services described in this note is  35  Minutes.  Kara Mead MD. Shade Flood. [facility] Pager [phone] If no response call [phone]    03/01/2012 9:01 AM

## 2012-03-01 NOTE — Progress Notes (Signed)
Pt appears to be trying to convert him self into SR on monitor; HR in the 50s-60s.  MD notified; no order to stop amiodarone gtt at this time; BP 93/62 (70). will continue to monitor closely  And update as needed

## 2012-03-01 NOTE — Progress Notes (Signed)
ANTIBIOTIC CONSULT NOTE - Follow Up Pharmacy Consult for Zosyn Indication: PNA r/o sepsis -> Tracheobronchitis  No Known Allergies  Patient Measurements: Height: 6' (182.9 cm) Weight: 176 lb 9.4 oz (80.1 kg) IBW/kg (Calculated) : 77.6   Vital Signs: Temp: 98.9 F (37.2 C) (06/27 1200) Temp src: Oral (06/27 1200) BP: 127/83 mmHg (06/27 1400) Pulse Rate: 68  (06/27 1228) Intake/Output from previous day: 06/26 0701 - 06/27 0700 In: 2474.2 [I.V.:1294.2; NG/GT:830; IV Piggyback:350] Out: 920 [Urine:920] Intake/Output from this shift: Total I/O In: 703.4 [I.V.:373.4; NG/GT:80; IV Piggyback:250] Out: 60 [Urine:60]  Labs:  Basename 03/01/12 0327 02/29/12 1700 02/29/12 1009  WBC 8.0 -- 7.1  HGB 11.4* -- 11.3*  PLT 145* -- 136*  LABCREA -- -- --  CREATININE 1.61* 1.66* 1.74*   Estimated Creatinine Clearance: 48.2 ml/min (by C-G formula based on Cr of 1.61). No results found for this basename: VANCOTROUGH:2,VANCOPEAK:2,VANCORANDOM:2,GENTTROUGH:2,GENTPEAK:2,GENTRANDOM:2,TOBRATROUGH:2,TOBRAPEAK:2,TOBRARND:2,AMIKACINPEAK:2,AMIKACINTROU:2,AMIKACIN:2, in the last 72 hours   Microbiology: Recent Results (from the past 720 hour(s))  CULTURE, BLOOD (ROUTINE X 2)     Status: Normal   Collection Time   02/22/12 11:03 PM      Component Value Range Status Comment   Specimen Description BLOOD RIGHT HAND   Final    Special Requests BOTTLES DRAWN AEROBIC AND ANAEROBIC [facility]   Final    Culture  Setup Time [id number]   Final    Culture NO GROWTH 5 DAYS   Final    Report Status 02/29/2012 FINAL   Final   CULTURE, BLOOD (ROUTINE X 2)     Status: Normal   Collection Time   02/22/12 11:08 PM      Component Value Range Status Comment   Specimen Description BLOOD LEFT HAND   Final    Special Requests BOTTLES DRAWN AEROBIC ONLY 5CC    Final    Culture  Setup Time [id number]   Final    Culture NO GROWTH 5 DAYS   Final    Report Status 02/29/2012 FINAL   Final   URINE CULTURE     Status:  Normal   Collection Time   02/22/12 11:40 PM      Component Value Range Status Comment   Specimen Description URINE, CATHETERIZED   Final    Special Requests NONE   Final    Culture  Setup Time [id number]   Final    Colony Count NO GROWTH   Final    Culture NO GROWTH   Final    Report Status 02/24/2012 FINAL   Final   MRSA PCR SCREENING     Status: Abnormal   Collection Time   02/23/12  1:07 AM      Component Value Range Status Comment   MRSA by PCR POSITIVE (*) NEGATIVE Final   CULTURE, RESPIRATORY     Status: Normal   Collection Time   02/25/12 12:45 PM      Component Value Range Status Comment   Specimen Description TRACHEAL ASPIRATE   Final    Special Requests NONE   Final    Gram Stain     Final    Value: ABUNDANT WBC PRESENT, PREDOMINANTLY PMN     ABUNDANT GRAM NEGATIVE COCCOBACILLI     MODERATE GRAM POSITIVE COCCI IN PAIRS   Culture     Final    Value: MODERATE STREPTOCOCCUS PNEUMONIAE     ABUNDANT HAEMOPHILUS INFLUENZAE     Note: BETA LACTAMASE NEGATIVE   Report Status 02/28/2012 FINAL   Final  Organism ID, Bacteria STREPTOCOCCUS PNEUMONIAE   Final   CULTURE, BLOOD (ROUTINE X 2)     Status: Normal (Preliminary result)   Collection Time   02/25/12  1:04 PM      Component Value Range Status Comment   Specimen Description BLOOD RIGHT HAND   Final    Special Requests BOTTLES DRAWN AEROBIC AND ANAEROBIC 10CC   Final    Culture  Setup Time [id number]   Final    Culture     Final    Value:        BLOOD CULTURE RECEIVED NO GROWTH TO DATE CULTURE WILL BE HELD FOR 5 DAYS BEFORE ISSUING A FINAL NEGATIVE REPORT   Report Status PENDING   Incomplete   CULTURE, BLOOD (ROUTINE X 2)     Status: Normal (Preliminary result)   Collection Time   02/25/12  1:18 PM      Component Value Range Status Comment   Specimen Description BLOOD LEFT HAND   Final    Special Requests BOTTLES DRAWN AEROBIC ONLY [facility]   Final    Culture  Setup Time [id number]   Final    Culture     Final     Value:        BLOOD CULTURE RECEIVED NO GROWTH TO DATE CULTURE WILL BE HELD FOR 5 DAYS BEFORE ISSUING A FINAL NEGATIVE REPORT   Report Status PENDING   Incomplete   CULTURE, RESPIRATORY     Status: Normal   Collection Time   02/26/12  6:57 AM      Component Value Range Status Comment   Specimen Description TRACHEAL ASPIRATE   Final    Special Requests NONE   Final    Gram Stain     Final    Value: MODERATE WBC PRESENT,BOTH PMN AND MONONUCLEAR     NO SQUAMOUS EPITHELIAL CELLS SEEN     NO ORGANISMS SEEN   Culture     Final    Value: FEW HAEMOPHILUS INFLUENZAE     Note: BETA LACTAMASE NEGATIVE   Report Status 02/28/2012 FINAL   Final   CULTURE, BAL-QUANTITATIVE     Status: Normal (Preliminary result)   Collection Time   02/28/12  1:48 PM      Component Value Range Status Comment   Specimen Description BRONCHIAL ALVEOLAR LAVAGE   Final    Special Requests Normal   Final    Gram Stain     Final    Value: ABUNDANT WBC PRESENT,BOTH PMN AND MONONUCLEAR     NO SQUAMOUS EPITHELIAL CELLS SEEN     RARE GRAM POSITIVE RODS   Colony Count 30,000 COLONIES/ML   Final    Culture PSEUDOMONAS AERUGINOSA   Final    Report Status PENDING   Incomplete     Medical History: Past Medical History  Diagnosis Date  . Coronary artery disease   . Hypertension   . Diabetes mellitus   . Pneumonia   . Myocardial infarction   . Carotid artery stenosis     Medications:  Scheduled:     . albuterol-ipratropium  6 puff Inhalation Q4H  . antiseptic oral rinse  15 mL Mouth Rinse QID  . aspirin  81 mg Per Tube Daily  . chlorhexidine  15 mL Mouth Rinse BID  . enoxaparin  40 mg Subcutaneous Q24H  . famotidine  40 mg Oral Daily  . free water  100 mL Per Tube Q8H  . insulin aspart  0-4 Units Subcutaneous Q4H  .  piperacillin-tazobactam (ZOSYN)  IV  3.375 g Intravenous Q8H  . potassium chloride  10 mEq Intravenous Q1 Hr x 4  . potassium chloride  10 mEq Intravenous Q1 Hr x 4  . potassium chloride  40 mEq Per  Tube TID   Admit Complaint: 71 y.o. male admitted 02/22/2012 with vfib arrest. Pharmacy consulted to dose vancomycin and zosyn 6/24, Now on zosyn for tracheobronchitis  Overnight Events: 03/01/2012 vomiting, SBT with high RR, converted to NSR this am.   Assessment: Anticoagulation: Afib, DVTPx: enox 40 daily; CBC stable  Infectious Disease: suspect tracheobronchitis: afeb, WBC wnl Antibiotics: Vanc 6.24>>6/25 CTX 6.22>>6.25 Zosyn 6.25>> Cultures:sputum cx grew strep  Cardiovascular: s/p vfib arrest, complete artic sun protocol 6.21, afib RVR., CAD / PVD / HTN / HLD : ASA and Amio ggt; .BP now in 100-150s, HR 60-70s now in NSR Endocrinology: DM  Gastrointestinal / Nutrition: TF, famotidine  Neurology/MSK: sedated on vent:  Propofol (TG 167 on 6/25)  Nephrology/Urology/Electrolytes: CKD, ARF SCr improving post arrest UOP 0.6 ml/kg.hr  Pulmonary: ARf;  PRVC 16/620/5 40% vent, with PSTs  PTA Medication Issues: Home Meds Not Ordered: diltiazem, Lasix/KCL  Best Practices: MC, Lovenox, Pepcid IV, lacrilube  Goal of Therapy:  Renal adjustment of antibiotics  Plan:  Continue Zosyn 3.375g IV q8h infuse over 4h dose remains appropriate for renal function. Follow up SCr, UOP, cultures, clinical course and adjust as clinically indicated.   Thank you for allowing pharmacy to be a part of this patients care team.  Rowe Robert Pharm.D., BCPS Clinical Pharmacist 03/01/2012 3:09 PM Pager: [phone] Phone: [phone]

## 2012-03-02 ENCOUNTER — Inpatient Hospital Stay (HOSPITAL_COMMUNITY): Payer: Medicare Other

## 2012-03-02 LAB — BASIC METABOLIC PANEL
CO2: 29 mEq/L (ref 19–32)
Calcium: 9 mg/dL (ref 8.4–10.5)
Creatinine, Ser: 1.64 mg/dL — ABNORMAL HIGH (ref 0.50–1.35)
GFR calc Af Amer: 48 mL/min — ABNORMAL LOW (ref >90)
GFR calc non Af Amer: 41 mL/min — ABNORMAL LOW (ref >90)
Sodium: 148 mEq/L — ABNORMAL HIGH (ref 135–145)

## 2012-03-02 LAB — CULTURE, BLOOD (ROUTINE X 2)
Culture  Setup Time: 201306221725
Culture  Setup Time: 201306221726
Culture: NO GROWTH

## 2012-03-02 LAB — CULTURE, BAL-QUANTITATIVE W GRAM STAIN: Special Requests: NORMAL

## 2012-03-02 LAB — GLUCOSE, CAPILLARY
Glucose-Capillary: 120 mg/dL — ABNORMAL HIGH (ref 70–99)
Glucose-Capillary: 136 mg/dL — ABNORMAL HIGH (ref 70–99)
Glucose-Capillary: 137 mg/dL — ABNORMAL HIGH (ref 70–99)
Glucose-Capillary: 125 mg/dL — ABNORMAL HIGH (ref 70–99)

## 2012-03-02 MED ORDER — AMIODARONE HCL 200 MG PO TABS
200.0000 mg | ORAL_TABLET | Freq: Two times a day (BID) | ORAL | Status: DC
  Filled 2012-03-02 (×2): qty 1

## 2012-03-02 MED ORDER — AMIODARONE HCL 200 MG PO TABS
200.0000 mg | ORAL_TABLET | Freq: Two times a day (BID) | ORAL | Status: DC
  Administered 2012-03-02 – 2012-03-03 (×4): 200 mg
  Filled 2012-03-02 (×6): qty 1

## 2012-03-02 MED ORDER — CARVEDILOL 6.25 MG PO TABS
6.2500 mg | ORAL_TABLET | Freq: Two times a day (BID) | ORAL | Status: DC
  Administered 2012-03-02 – 2012-03-05 (×6): 6.25 mg
  Filled 2012-03-02 (×9): qty 1

## 2012-03-02 MED ORDER — FUROSEMIDE 10 MG/ML IJ SOLN
40.0000 mg | Freq: Once | INTRAMUSCULAR | Status: AC
  Administered 2012-03-02: 40 mg via INTRAVENOUS
  Filled 2012-03-02: qty 4

## 2012-03-02 NOTE — Progress Notes (Signed)
Notified MD of pt's lung sounding very rhoncorus, more than yesterday; no new orders received; will continue to monitor closely and update as needed

## 2012-03-02 NOTE — Progress Notes (Signed)
Pt's foley catheter was leaking brown, bloody urine.  When catheter was removed, blood clots noted on catheter tip; new 16FR foley inserted with sterile technique. Frank blood noted around insertion site before, during and after insertion.   Will continue to monitor and update as needed.

## 2012-03-02 NOTE — Progress Notes (Signed)
Name: Logan White MRN: [id number] DOB: 24-Aug-1941    LOS: 9  Referring Provider:  Alvino Chapel Reason for Referral:  Cardiac arrest  PULMONARY / CRITICAL CARE MEDICINE  PROFILE:  This is a 71 y/o male with CAD admitted 02/22/2012 after cardiac arrest. Initial rhythm VF. Defib X 2 by EMS in field. Duration of pulselessness > 12 minutes. Underwent [place] protocol (completed 6/21 PM). Did not undergo cath   Events Since Admission: 6/19 Head CT >>NAD 6/23 Severe agitation - on fent and diprivan 6/25 ETT changed, bronch, high residuals on TFs 6/26  agiation , back On fent and diprivan, Off pressors  Current Status:   high RR on SBT,  Agitated briefly off sedation  Still on amio gtt   Vital Signs: Temp:  [97.4 F (36.3 C)-99.1 F (37.3 C)] 98.5 F (36.9 C) (06/28 1613) Resp:  [16-22] 20  (06/28 1700) BP: (107-185)/(75-125) 107/75 mmHg (06/28 1700) SpO2:  [96 %-100 %] 96 % (06/28 1700) FiO2 (%):  [40 %] 40 % (06/28 1700)  Physical Examination: Gen: agitated on vent HEENT: NCAT, PERRL, ETT in place PULM: diffuse rhonchi CV: RRR s M AB: BS+, soft, nontender, no hsm Ext: very cold feet with pulses not palpable Neuro RASS +2 on wua    Intake/Output Summary (Last 24 hours) at 03/02/12 1838 Last data filed at 03/02/12 1700  Gross per 24 hour  Intake 1660.17 ml  Output   3045 ml  Net -1384.83 ml   Principal Problem:  *Cardiac arrest Active Problems:  Atrial fibrillation with rapid ventricular response  Acute respiratory failure with hypoxia  CKD (chronic kidney disease)  Ventricular fibrillation  DM (diabetes mellitus)  Cardiogenic shock  Acute renal failure  Anoxic encephalopathy  Ischemic cardiomyopathy  Septic shock   ASSESSMENT AND PLAN  PULMONARY  Lab 02/25/12 0244  PHART 7.392  PCO2ART 43.0  PO2ART 91.0  HCO3 25.6*  O2SAT 96.5   Ventilator Settings: Vent Mode:  [-] PRVC FiO2 (%):  [40 %] 40 % Set Rate:  [16 bmp] 16 bmp Vt Set:  [620 mL]  620 mL PEEP:  [5 cmH20] 5 cmH20 Plateau Pressure:  [17 cmH20-22 cmH20] 19 cmH20  CXR:  CM ,edema pattern with bibasal ASD l >>r ETT:  6/19 >>  A:  Acute respiratory failure due to cardiac arrest; smoker, suspect underlying COPD  -on 6/24: does not meet sbt criteria due to shock and agitation P:   -SBTs . -Rx pneumonia -ct combivent   CARDIOVASCULAR  Lab 02/29/12 0530 02/26/12 0415  TROPONINI -- --  LATICACIDVEN -- --  PROBNP 8413.0* 11188.0*   ECG:   Echo 6/21: LVEF 30%. Diffuse HK, moderate MR, mild PAH Lines: 6/19 R IJ CVL >>   A:  1)Cardiac arrest due to V-fib in setting of profound hypokalemia.   2) Atrial fibrillation with RVR - resolved 3) History of CHF, Afib, HTN, CAD, Cerebrovascular disease 4) Shock post arrest - likely cardiogenic P:  -off pressors -cardiac cath/ ICD in future if recovery - Change to PO amio, ok to use low dose BB  RENAL  Lab 03/02/12 0500 03/01/12 0327 02/29/12 1700 02/29/12 1226 02/29/12 1009 02/29/12 0530 02/27/12 0334 02/25/12 0245  NA 148* 142 144 -- 145 -- 138 --  K 3.5 3.1* -- -- -- -- -- --  CL 103 102 103 -- 101 -- 99 --  CO2 29 30 31  -- 33* -- 27 --  BUN 26* 20 19 -- 20 -- 30* --  CREATININE 1.64*  1.61* 1.66* -- 1.74* 1.81* -- --  CALCIUM 9.0 8.6 8.5 -- 8.6 -- 9.2 --  MG -- -- -- 1.8 -- -- -- 1.6  PHOS -- -- -- -- -- -- -- 4.6   Intake/Output      06/27 0701 - 06/28 0700 06/28 0701 - 06/29 0700   I.V. (mL/kg) 1403.1 (17.5) 403 (5)   NG/GT 260 235   IV Piggyback 350 54   Total Intake(mL/kg) 2013.1 (25.1) 692 (8.6)   Urine (mL/kg/hr) 560 (0.3) 2705 (2.9)   Total Output 560 2705   Net +1453.1 -2013        Emesis Occurrence 2 x     Foley:  6/19  A:   1) CKD, baseline Cr in last year is 1.2-2.1. 2) Mild acute renal insuff getting worse 6/24 3) hypokalemia   P:   -diurese -repelted K, Mg ok  GASTROINTESTINAL No results found for this basename: AST:5,ALT:5,ALKPHOS:5,BILITOT:5,PROT:5,ALBUMIN:5 in the last 168  hours  A: Elevated LFTs - likely shock and/or congestion.  P:   -Monitor LFTs intermittently   HEMATOLOGIC  Lab 03/01/12 0327 02/29/12 1009 02/27/12 0334 02/26/12 0415 02/25/12 0245  HGB 11.4* 11.3* 12.9* 13.5 13.8  HCT 31.9* 31.5* 36.2* 37.8* 38.4*  PLT 145* 136* 156 138* 167  INR -- -- -- -- --  APTT -- -- -- -- --   A:  No acute issues P:  - aim for Hb 7 or above    INFECTIOUS  Lab 03/01/12 0327 02/29/12 1009 02/27/12 0334 02/26/12 0415 02/25/12 0245  WBC 8.0 7.1 9.5 7.2 12.0*  PROCALCITON -- -- -- 13.17 --   Cultures: Blood X 2 6/22 >> ng Resp 6/22 >> strep pneumonia, hemophilus BAL 6/25 >> pseudomonas >>  Ceftriaxone 6/22 >>6/25 Zosyn 6/25 >>  A:  Low grade fever 6/22, purulent ET secretions - likely purulent tracheobronchitis  P:    - FU sens of pseudomonas - dc vancomycin  ENDOCRINE  Lab 03/02/12 1536 03/02/12 1150 03/02/12 0746 03/02/12 0407 03/01/12 2322  GLUCAP 137* 139* 136* 120* 83   A:  History of glucose intolerance  Episodes of hypoglycemia P:   -ct TFs - Cont SSI  NEUROLOGIC  A:   - on 6/24: ? Followed x 1 command.  Head CT neg 6/20 eeg 6/25 slowing  P:   fent prn,  haldol prn if qTc ok  diprivan gtt to rest Daily [place] / DISPOSITION Level of Care:  ICU Primary Service: PCCM Consultants:  Interventional Cardiology [name]), [facility] Cardiology Code Status:  Full Diet:  TFs DVT Px:  lovenox GI Px:  pepcid Social / Family: Updated grand-d Danae Chen 6/27.   The patient is critically ill with multiple organ systems failure and requires high complexity decision making for assessment and support, frequent evaluation and titration of therapies, application of advanced monitoring technologies and extensive interpretation of multiple databases.   Critical Care Time devoted to patient care services described in this note is  35  Minutes.  Kara Mead MD. Shade Flood. [place] [facility] Pager [phone] If no response  call [phone]    03/02/2012 6:38 PM

## 2012-03-02 NOTE — Progress Notes (Signed)
Patient: Logan White Date of Encounter: 03/02/2012, 9:00 AM Admit date: 02/22/2012     Subjective  Mr. Mayton remains intubated. Per nursing, patient failed his SBT this AM. He was slowly arousable to voice off sedation but became tachypneic and agitated off sedation.    Objective  Physical Exam: Vitals: BP 172/99  Pulse 66  Temp 98.6 F (37 C) (Axillary)  Resp 21  Ht 6' (1.829 m)  Wt 176 lb 9.4 oz (80.1 kg)  BMI 23.95 kg/m2  SpO2 97% General: Critically ill appearing 71 year old male who is intubated and sedated. Neck: Supple. JVD difficult to assess due to patient position but does not appear elevated. Lungs: Mechanical ventilation without wheezes or rhonchi auscultated anteriorly. Heart: RRR S1 S2 without murmur, rub, or gallop.  Abdomen: Appears soft, non-distended. Extremities: No clubbing or cyanosis. No edema.   Neuro: Deferred. Intubated and sedated.  Intake/Output: Intake/Output Summary (Last 24 hours) at 03/02/12 0900 Last data filed at 03/02/12 0845  Gross per 24 hour  Intake 1960.02 ml  Output    560 ml  Net 1400.02 ml   Inpatient Medications:  . albuterol-ipratropium  6 puff Inhalation Q4H  . amiodarone  200 mg Oral BID  . antiseptic oral rinse  15 mL Mouth Rinse QID  . aspirin  81 mg Per Tube Daily  . chlorhexidine  15 mL Mouth Rinse BID  . enoxaparin  40 mg Subcutaneous Q24H  . famotidine  40 mg Oral Daily  . free water  100 mL Per Tube Q8H  . furosemide  40 mg Intravenous Once  . insulin aspart  0-4 Units Subcutaneous Q4H  . piperacillin-tazobactam (ZOSYN)  IV  3.375 g Intravenous Q8H  . potassium chloride  10 mEq Intravenous Q1 Hr x 4   . sodium chloride 10 mL/hr at 02/28/12 1900  . sodium chloride 10 mL/hr at 02/29/12 1400  . dextrose 20 mL/hr at 02/27/12 0451  . feeding supplement (OSMOLITE 1.5 CAL) Stopped (02/27/12 0412)  . propofol 30 mcg/kg/min (03/02/12 0845)   Labs: Basename 03/02/12 0500 03/01/12 0327 02/29/12 1226  NA 148*  142 --  K 3.5 3.1* --  CL 103 102 --  CO2 29 30 --  GLUCOSE 118* 125* --  BUN 26* 20 --  CREATININE 1.64* 1.61* --  CALCIUM 9.0 8.6 --  MG -- -- 1.8  PHOS -- -- --   Basename 03/01/12 0327 02/29/12 1009  WBC 8.0 7.1  NEUTROABS -- --  HGB 11.4* 11.3*  HCT 31.9* 31.5*  MCV 71.2* 71.1*  PLT 145* 136*   No results found for this basename: CKTOTAL:4,CKMB:4,TROPONINI:4 in the last 72 hours No components found with this basename: POCBNP:3 No results found for this basename: TSH,T4TOTAL,FREET3,T3FREE,THYROIDAB in the last 72 hours No results found for this basename: VITAMINB12,FOLATE,FERRITIN,TIBC,IRON,RETICCTPCT in the last 72 hours  Radiology/Studies: 03/02/2012  *RADIOLOGY REPORT*  PORTABLE CHEST - 1 VIEW  Comparison: 03/01/2012  Findings: Endotracheal tube is 3.8 cm above the carina.  There is enlargement of the cardiac silhouette.  Persistent airspace disease at the right lung base and persistent retrocardiac opacification. The aortic arch is heavily calcified.  Central line in the upper SVC region.  Nasogastric tube extends into the abdomen.  IMPRESSION:  Persistent bibasilar airspace disease.  Cardiomegaly.  Stable support apparatuses.  Original Report Authenticated By: Markus Daft, M.D.   Echocardiogram: from 02/24/2012 - Left ventricle: Mid and basal inferior wall akinesis The cavity size was moderately dilated. Wall thickness was increased  in a pattern of mild LVH. The estimated ejection fraction was 30%. Diffuse hypokinesis. - Mitral valve: Moderate regurgitation. - Left atrium: The atrium was moderately dilated. - Right atrium: The atrium was mildly dilated. - Atrial septum: No defect or patent foramen ovale was identified. - Pulmonary arteries: PA peak pressure: 51mm Hg (S).  Telemetry: normal sinus rhythm currently with occasional PVCs   Assessment and Plan  1. s/p VF arrest - in setting of profound hypokalemia; also has LV dysfunction, EF 30%, with inferior wall akinesis and  global hypokinesis; cardiac enzymes negative; completed [facility] protocol 02/25/2012; remains intubated and sedated; agree with continuing amiodarone and will add low dose beta blocker; avoid volume overload - I/Os positive (+1400) today and agree gentle diuresis with IV lasix; if has neuro recovery, consider LHC for definitive evaluation of coronary anatomy   2. LV dysfunction - new (echo April 2012, EF 50%); add BB; no ACEI/ARB due to renal dysfunction 3. CAD - has been managed medically; s/p aborted anterior STEMI - with Cypher DES to LAD 06/2003 (residual disease at time of LHC: first diagonal 50%, circumflex 40% and multiple distal 70%, EF 55%); myoview 11/2008: EF 47% with infero-apical ischemia, felt to be low risk and medical management recommended  4. PAF - currently in NSR 5. CKD  Dr. Rayann Heman to see and make further recommendations as needed. Signed, EDMISTEN, BROOKE PA-C   I have seen, examined the patient, and reviewed the above assessment and plan.  Changes to above are made where necessary.  Further CV testing is deferred until he wakes.  In the interim, we will continue to titrate medicine. He is back in afib.  Continue amiodarone.  I will add Coreg.  This should be titrated over the weekend as BP allows.   Co Sign: Thompson Grayer, MD 03/02/2012 7:32 PM   Pt remains unresponsive.

## 2012-03-02 NOTE — Progress Notes (Signed)
ANTIBIOTIC CONSULT NOTE - Follow Up Pharmacy Consult for Zosyn Indication: PNA r/o sepsis -> Tracheobronchitis  No Known Allergies  Patient Measurements: Height: 6' (182.9 cm) Weight: 176 lb 9.4 oz (80.1 kg) IBW/kg (Calculated) : 77.6   Vital Signs: Temp: 98.6 F (37 C) (06/28 0748) Temp src: Axillary (06/28 0748) BP: 167/114 mmHg (06/28 1000) Intake/Output from previous day: 06/27 0701 - 06/28 0700 In: 2013.1 [I.V.:1403.1; NG/GT:260; IV Piggyback:350] Out: 560 [Urine:560] Intake/Output from this shift: Total I/O In: 317.8 [I.V.:148.8; NG/GT:115; IV Piggyback:54] Out: 225 [Urine:225]  Labs:  Basename 03/02/12 0500 03/01/12 0327 02/29/12 1700 02/29/12 1009  WBC -- 8.0 -- 7.1  HGB -- 11.4* -- 11.3*  PLT -- 145* -- 136*  LABCREA -- -- -- --  CREATININE 1.64* 1.61* 1.66* --   Estimated Creatinine Clearance: 47.3 ml/min (by C-G formula based on Cr of 1.64). No results found for this basename: VANCOTROUGH:2,VANCOPEAK:2,VANCORANDOM:2,GENTTROUGH:2,GENTPEAK:2,GENTRANDOM:2,TOBRATROUGH:2,TOBRAPEAK:2,TOBRARND:2,AMIKACINPEAK:2,AMIKACINTROU:2,AMIKACIN:2, in the last 72 hours   Microbiology: Recent Results (from the past 720 hour(s))  CULTURE, BLOOD (ROUTINE X 2)     Status: Normal   Collection Time   02/22/12 11:03 PM      Component Value Range Status Comment   Specimen Description BLOOD RIGHT HAND   Final    Special Requests BOTTLES DRAWN AEROBIC AND ANAEROBIC [facility]   Final    Culture  Setup Time [id number]   Final    Culture NO GROWTH 5 DAYS   Final    Report Status 02/29/2012 FINAL   Final   CULTURE, BLOOD (ROUTINE X 2)     Status: Normal   Collection Time   02/22/12 11:08 PM      Component Value Range Status Comment   Specimen Description BLOOD LEFT HAND   Final    Special Requests BOTTLES DRAWN AEROBIC ONLY 5CC    Final    Culture  Setup Time [id number]   Final    Culture NO GROWTH 5 DAYS   Final    Report Status 02/29/2012 FINAL   Final   URINE CULTURE      Status: Normal   Collection Time   02/22/12 11:40 PM      Component Value Range Status Comment   Specimen Description URINE, CATHETERIZED   Final    Special Requests NONE   Final    Culture  Setup Time [id number]   Final    Colony Count NO GROWTH   Final    Culture NO GROWTH   Final    Report Status 02/24/2012 FINAL   Final   MRSA PCR SCREENING     Status: Abnormal   Collection Time   02/23/12  1:07 AM      Component Value Range Status Comment   MRSA by PCR POSITIVE (*) NEGATIVE Final   CULTURE, RESPIRATORY     Status: Normal   Collection Time   02/25/12 12:45 PM      Component Value Range Status Comment   Specimen Description TRACHEAL ASPIRATE   Final    Special Requests NONE   Final    Gram Stain     Final    Value: ABUNDANT WBC PRESENT, PREDOMINANTLY PMN     ABUNDANT GRAM NEGATIVE COCCOBACILLI     MODERATE GRAM POSITIVE COCCI IN PAIRS   Culture     Final    Value: MODERATE STREPTOCOCCUS PNEUMONIAE     ABUNDANT HAEMOPHILUS INFLUENZAE     Note: BETA LACTAMASE NEGATIVE   Report Status 02/28/2012 FINAL   Final  Organism ID, Bacteria STREPTOCOCCUS PNEUMONIAE   Final   CULTURE, BLOOD (ROUTINE X 2)     Status: Normal   Collection Time   02/25/12  1:04 PM      Component Value Range Status Comment   Specimen Description BLOOD RIGHT HAND   Final    Special Requests BOTTLES DRAWN AEROBIC AND ANAEROBIC 10CC   Final    Culture  Setup Time [id number]   Final    Culture NO GROWTH 5 DAYS   Final    Report Status 03/02/2012 FINAL   Final   CULTURE, BLOOD (ROUTINE X 2)     Status: Normal   Collection Time   02/25/12  1:18 PM      Component Value Range Status Comment   Specimen Description BLOOD LEFT HAND   Final    Special Requests BOTTLES DRAWN AEROBIC ONLY [facility]   Final    Culture  Setup Time [id number]   Final    Culture NO GROWTH 5 DAYS   Final    Report Status 03/02/2012 FINAL   Final   CULTURE, RESPIRATORY     Status: Normal   Collection Time   02/26/12  6:57 AM       Component Value Range Status Comment   Specimen Description TRACHEAL ASPIRATE   Final    Special Requests NONE   Final    Gram Stain     Final    Value: MODERATE WBC PRESENT,BOTH PMN AND MONONUCLEAR     NO SQUAMOUS EPITHELIAL CELLS SEEN     NO ORGANISMS SEEN   Culture     Final    Value: FEW HAEMOPHILUS INFLUENZAE     Note: BETA LACTAMASE NEGATIVE   Report Status 02/28/2012 FINAL   Final   CULTURE, BAL-QUANTITATIVE     Status: Normal   Collection Time   02/28/12  1:48 PM      Component Value Range Status Comment   Specimen Description BRONCHIAL ALVEOLAR LAVAGE   Final    Special Requests Normal   Final    Gram Stain     Final    Value: ABUNDANT WBC PRESENT,BOTH PMN AND MONONUCLEAR     NO SQUAMOUS EPITHELIAL CELLS SEEN     RARE GRAM POSITIVE RODS   Colony Count 30,000 COLONIES/ML   Final    Culture PSEUDOMONAS AERUGINOSA   Final    Report Status 03/02/2012 FINAL   Final    Organism ID, Bacteria PSEUDOMONAS AERUGINOSA   Final     Medical History: Past Medical History  Diagnosis Date  . Coronary artery disease   . Hypertension   . Diabetes mellitus   . Pneumonia   . Myocardial infarction   . Carotid artery stenosis     Medications:  Scheduled:     . albuterol-ipratropium  6 puff Inhalation Q4H  . amiodarone  200 mg Per Tube BID  . antiseptic oral rinse  15 mL Mouth Rinse QID  . aspirin  81 mg Per Tube Daily  . chlorhexidine  15 mL Mouth Rinse BID  . enoxaparin  40 mg Subcutaneous Q24H  . famotidine  40 mg Oral Daily  . free water  100 mL Per Tube Q8H  . furosemide  40 mg Intravenous Once  . insulin aspart  0-4 Units Subcutaneous Q4H  . piperacillin-tazobactam (ZOSYN)  IV  3.375 g Intravenous Q8H  . potassium chloride  10 mEq Intravenous Q1 Hr x 4  . DISCONTD: amiodarone  200 mg Oral BID  Admit Complaint: 71 y.o. male admitted 02/22/2012 with vfib arrest. Now on zosyn for tracheobronchitis  Infectious Disease: suspect tracheobronchitis: afeb, WBC 8. Scr 1.64  stable. UOP 0.5ml/kg/hr last 24h.  Antibiotics: Vanc 6.24>>6/25 CTX 6.22>>6.25 Zosyn 6.25>> Cultures:sputum cx grew strep   Goal of Therapy:  Renal adjustment of antibiotics  Plan:  Continue Zosyn 3.375g IV q8h infuse over 4h dose remains appropriate for renal function unless CrCl<30. Pharmacy will sign off. Please reconsult for further dosing assistance.   Syncere Eble S. Alford Highland, PharmD, [place] Clinical Staff Pharmacist Pager [phone]

## 2012-03-03 ENCOUNTER — Inpatient Hospital Stay (HOSPITAL_COMMUNITY): Payer: Medicare Other

## 2012-03-03 DIAGNOSIS — I2589 Other forms of chronic ischemic heart disease: Secondary | ICD-10-CM

## 2012-03-03 LAB — GLUCOSE, CAPILLARY: Glucose-Capillary: 109 mg/dL — ABNORMAL HIGH (ref 70–99)

## 2012-03-03 LAB — CBC
HCT: 37.5 % — ABNORMAL LOW (ref 39.0–52.0)
Hemoglobin: 13.1 g/dL (ref 13.0–17.0)
RDW: 17.7 % — ABNORMAL HIGH (ref 11.5–15.5)
WBC: 14.8 10*3/uL — ABNORMAL HIGH (ref 4.0–10.5)

## 2012-03-03 LAB — BASIC METABOLIC PANEL
Chloride: 104 mEq/L (ref 96–112)
Creatinine, Ser: 1.61 mg/dL — ABNORMAL HIGH (ref 0.50–1.35)
GFR calc Af Amer: 49 mL/min — ABNORMAL LOW (ref >90)
GFR calc non Af Amer: 42 mL/min — ABNORMAL LOW (ref >90)
Potassium: 3.1 mEq/L — ABNORMAL LOW (ref 3.5–5.1)

## 2012-03-03 LAB — TRIGLYCERIDES: Triglycerides: 195 mg/dL — ABNORMAL HIGH (ref <150)

## 2012-03-03 MED ORDER — SODIUM CHLORIDE 0.9 % IV SOLN: 0.2000 ug/kg/h | INTRAVENOUS | Status: DC

## 2012-03-03 MED ORDER — POTASSIUM CHLORIDE 20 MEQ/15ML (10%) PO LIQD
40.0000 meq | Freq: Once | ORAL | Status: AC
  Administered 2012-03-03: 40 meq
  Filled 2012-03-03: qty 30

## 2012-03-03 MED ORDER — SODIUM CHLORIDE 0.9 % IV SOLN
0.4000 ug/kg/h | INTRAVENOUS | Status: AC
  Administered 2012-03-03: 1.1 ug/kg/h via INTRAVENOUS
  Administered 2012-03-04 – 2012-03-05 (×4): 1 ug/kg/h via INTRAVENOUS
  Filled 2012-03-03 (×8): qty 4

## 2012-03-03 MED ORDER — FREE WATER
200.0000 mL | Freq: Four times a day (QID) | Status: DC
  Administered 2012-03-03 – 2012-03-05 (×8): 200 mL

## 2012-03-03 MED ORDER — MIDAZOLAM HCL 2 MG/2ML IJ SOLN
2.0000 mg | INTRAMUSCULAR | Status: DC | PRN
  Administered 2012-03-04 – 2012-03-05 (×4): 2 mg via INTRAVENOUS
  Filled 2012-03-03 (×4): qty 2

## 2012-03-03 MED ORDER — DEXTROSE 5 % IV SOLN
2.0000 g | Freq: Two times a day (BID) | INTRAVENOUS | Status: DC
  Administered 2012-03-03 – 2012-03-05 (×5): 2 g via INTRAVENOUS
  Filled 2012-03-03 (×6): qty 2

## 2012-03-03 MED ORDER — DEXTROSE 5 % IV SOLN
30.0000 ug/min | INTRAVENOUS | Status: DC
  Administered 2012-03-03: 30 ug/min via INTRAVENOUS
  Administered 2012-03-03 – 2012-03-04 (×2): 10 ug/min via INTRAVENOUS
  Filled 2012-03-03 (×2): qty 1

## 2012-03-03 MED ORDER — SODIUM CHLORIDE 0.9 % IV BOLUS (SEPSIS)
500.0000 mL | Freq: Once | INTRAVENOUS | Status: AC
  Administered 2012-03-03: 13:00:00 via INTRAVENOUS

## 2012-03-03 MED ORDER — SODIUM CHLORIDE 0.9 % IV SOLN
0.2000 ug/kg/h | INTRAVENOUS | Status: DC
  Administered 2012-03-03: 1.1 ug/kg/h via INTRAVENOUS
  Administered 2012-03-03: 1 ug/kg/h via INTRAVENOUS
  Administered 2012-03-03: 0.4 ug/kg/h via INTRAVENOUS
  Administered 2012-03-03: 0.2 ug/kg/h via INTRAVENOUS
  Administered 2012-03-03: 1.1 ug/kg/h via INTRAVENOUS
  Administered 2012-03-03: 1 ug/kg/h via INTRAVENOUS
  Filled 2012-03-03 (×5): qty 2

## 2012-03-03 NOTE — Progress Notes (Signed)
ANTIBIOTIC CONSULT NOTE - INITIAL  Pharmacy Consult for cefepime Indication: pneumonia  No Known Allergies  Patient Measurements: Height: 6' (182.9 cm) Weight: 176 lb 9.4 oz (80.1 kg) IBW/kg (Calculated) : 77.6  Adjusted Body Weight:   Vital Signs: Temp: 98.2 F (36.8 C) (06/29 0400) Temp src: Oral (06/29 0400) BP: 111/73 mmHg (06/29 0800) Intake/Output from previous day: 06/28 0701 - 06/29 0700 In: 1584.7 [I.V.:915.7; NG/GT:515; IV Piggyback:154] Out: [id number] [Urine:3235] Intake/Output from this shift:    Labs:  Basename 03/03/12 0338 03/02/12 0500 03/01/12 0327 02/29/12 1009  WBC 14.8* -- 8.0 7.1  HGB 13.1 -- 11.4* 11.3*  PLT 257 -- 145* 136*  LABCREA -- -- -- --  CREATININE 1.61* 1.64* 1.61* --   Estimated Creatinine Clearance: 48.2 ml/min (by C-G formula based on Cr of 1.61). No results found for this basename: VANCOTROUGH:2,VANCOPEAK:2,VANCORANDOM:2,GENTTROUGH:2,GENTPEAK:2,GENTRANDOM:2,TOBRATROUGH:2,TOBRAPEAK:2,TOBRARND:2,AMIKACINPEAK:2,AMIKACINTROU:2,AMIKACIN:2, in the last 72 hours   Microbiology: Recent Results (from the past 720 hour(s))  CULTURE, BLOOD (ROUTINE X 2)     Status: Normal   Collection Time   02/22/12 11:03 PM      Component Value Range Status Comment   Specimen Description BLOOD RIGHT HAND   Final    Special Requests BOTTLES DRAWN AEROBIC AND ANAEROBIC [facility]   Final    Culture  Setup Time [id number]   Final    Culture NO GROWTH 5 DAYS   Final    Report Status 02/29/2012 FINAL   Final   CULTURE, BLOOD (ROUTINE X 2)     Status: Normal   Collection Time   02/22/12 11:08 PM      Component Value Range Status Comment   Specimen Description BLOOD LEFT HAND   Final    Special Requests BOTTLES DRAWN AEROBIC ONLY 5CC    Final    Culture  Setup Time [id number]   Final    Culture NO GROWTH 5 DAYS   Final    Report Status 02/29/2012 FINAL   Final   URINE CULTURE     Status: Normal   Collection Time   02/22/12 11:40 PM      Component Value Range  Status Comment   Specimen Description URINE, CATHETERIZED   Final    Special Requests NONE   Final    Culture  Setup Time [id number]   Final    Colony Count NO GROWTH   Final    Culture NO GROWTH   Final    Report Status 02/24/2012 FINAL   Final   MRSA PCR SCREENING     Status: Abnormal   Collection Time   02/23/12  1:07 AM      Component Value Range Status Comment   MRSA by PCR POSITIVE (*) NEGATIVE Final   CULTURE, RESPIRATORY     Status: Normal   Collection Time   02/25/12 12:45 PM      Component Value Range Status Comment   Specimen Description TRACHEAL ASPIRATE   Final    Special Requests NONE   Final    Gram Stain     Final    Value: ABUNDANT WBC PRESENT, PREDOMINANTLY PMN     ABUNDANT GRAM NEGATIVE COCCOBACILLI     MODERATE GRAM POSITIVE COCCI IN PAIRS   Culture     Final    Value: MODERATE STREPTOCOCCUS PNEUMONIAE     ABUNDANT HAEMOPHILUS INFLUENZAE     Note: BETA LACTAMASE NEGATIVE   Report Status 02/28/2012 FINAL   Final    Organism ID, Bacteria STREPTOCOCCUS PNEUMONIAE   Final  CULTURE, BLOOD (ROUTINE X 2)     Status: Normal   Collection Time   02/25/12  1:04 PM      Component Value Range Status Comment   Specimen Description BLOOD RIGHT HAND   Final    Special Requests BOTTLES DRAWN AEROBIC AND ANAEROBIC 10CC   Final    Culture  Setup Time [id number]   Final    Culture NO GROWTH 5 DAYS   Final    Report Status 03/02/2012 FINAL   Final   CULTURE, BLOOD (ROUTINE X 2)     Status: Normal   Collection Time   02/25/12  1:18 PM      Component Value Range Status Comment   Specimen Description BLOOD LEFT HAND   Final    Special Requests BOTTLES DRAWN AEROBIC ONLY [facility]   Final    Culture  Setup Time [id number]   Final    Culture NO GROWTH 5 DAYS   Final    Report Status 03/02/2012 FINAL   Final   CULTURE, RESPIRATORY     Status: Normal   Collection Time   02/26/12  6:57 AM      Component Value Range Status Comment   Specimen Description TRACHEAL ASPIRATE   Final      Special Requests NONE   Final    Gram Stain     Final    Value: MODERATE WBC PRESENT,BOTH PMN AND MONONUCLEAR     NO SQUAMOUS EPITHELIAL CELLS SEEN     NO ORGANISMS SEEN   Culture     Final    Value: FEW HAEMOPHILUS INFLUENZAE     Note: BETA LACTAMASE NEGATIVE   Report Status 02/28/2012 FINAL   Final   CULTURE, BAL-QUANTITATIVE     Status: Normal   Collection Time   02/28/12  1:48 PM      Component Value Range Status Comment   Specimen Description BRONCHIAL ALVEOLAR LAVAGE   Final    Special Requests Normal   Final    Gram Stain     Final    Value: ABUNDANT WBC PRESENT,BOTH PMN AND MONONUCLEAR     NO SQUAMOUS EPITHELIAL CELLS SEEN     RARE GRAM POSITIVE RODS   Colony Count 30,000 COLONIES/ML   Final    Culture PSEUDOMONAS AERUGINOSA   Final    Report Status 03/02/2012 FINAL   Final    Organism ID, Bacteria PSEUDOMONAS AERUGINOSA   Final     Medical History: Past Medical History  Diagnosis Date  . Coronary artery disease   . Hypertension   . Diabetes mellitus   . Pneumonia   . Myocardial infarction   . Carotid artery stenosis    Assessment: Patientis a 71 y.o. m known to pharmacy from prior abx consult.    Antibiotics: Vanc 6/24>>6/25 CTX 6/22>>6/25 Zosyn 6/25>> 6/29 Cefepime 6/29>>  BAL 6/25>> pseudomonas BCX x2 6/22>> negative FINAL TA 6/23>> hew H. infu FINAL  TA 6/22>>H.infu, strep Pneumo BCX x2 6/19>> negative FINAL  To start cefepime for pseudomonas PNA.  Plan:  1) cefepime 2gm q12h   Razan Siler P 03/03/2012,8:51 AM

## 2012-03-03 NOTE — Progress Notes (Addendum)
Physician-Brief Progress Note Patient Name: Logan White DOB: 1940/09/21 MRN: [id number]  Date of Service  03/03/2012   HPI/Events of Note   Hypotensive SBP 80 andHR 40s on precedex  ICU Interventions  Fluid bolus and reassess  Update 1:11 PM  RN says bp improved to 90 sbp but patient agitated and she is unable to titrate precedex up further due to bp concern. SSo, will start neo for help with bp support. Goal bp is sbp > 90   Intervention Category Intermediate Interventions: Hypotension - evaluation and management  Zayanna Pundt 03/03/2012, 12:21 PM

## 2012-03-03 NOTE — Progress Notes (Signed)
[facility] Progress Note Patient Name: Logan White DOB: 1941/04/30 MRN: [id number]  Date of Service  03/03/2012   HPI/Events of Note  hypokalemia   eICU Interventions  Potassium replaced   Intervention Category Minor Interventions: Electrolytes abnormality - evaluation and management  Abrey Bradway 03/03/2012, 4:24 AM
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
## 2012-03-03 NOTE — Progress Notes (Signed)
Name: Logan White MRN: [id number] DOB: 31-May-1941    LOS: 10  Referring Provider:  Alvino Chapel Reason for Referral:  Cardiac arrest  PULMONARY / CRITICAL CARE MEDICINE  PROFILE:  This is a 71 y/o male with CAD admitted 02/22/2012 after cardiac arrest. Initial rhythm VF. Defib X 2 by EMS in field. Duration of pulselessness > 12 minutes. Underwent [place] protocol (completed 6/21 PM). Did not undergo cath   Events Since Admission: 6/19 Head CT >>NAD 6/23 Severe agitation - on fent and diprivan 6/25 ETT changed, bronch, high residuals on TFs 6/26  agitation , back On fent and diprivan, Off pressors  Current Status:  high RR on SBT,  Agitated off sedation  On propofol   Vital Signs: Temp:  [97.9 F (36.6 C)-99.1 F (37.3 C)] 98.2 F (36.8 C) (06/29 0400) Resp:  [16-22] 16  (06/29 0700) BP: (103-175)/(56-125) 109/66 mmHg (06/29 0700) SpO2:  [95 %-100 %] 97 % (06/29 0700) FiO2 (%):  [40 %] 40 % (06/29 0700)  Physical Examination: Gen: agitated on vent HEENT: NCAT, PERRL, ETT in place PULM: diffuse rhonchi CV: RRR s M AB: BS+, soft, nontender, no hsm Ext: very cold feet with pulses not palpable Neuro RASS +2 on wua    Intake/Output Summary (Last 24 hours) at 03/03/12 0839 Last data filed at 03/03/12 0700  Gross per 24 hour  Intake 1463.55 ml  Output   3235 ml  Net -1771.45 ml   Principal Problem:  *Cardiac arrest Active Problems:  Atrial fibrillation with rapid ventricular response  Acute respiratory failure with hypoxia  CKD (chronic kidney disease)  Ventricular fibrillation  DM (diabetes mellitus)  Cardiogenic shock  Acute renal failure  Anoxic encephalopathy  Ischemic cardiomyopathy  Septic shock   ASSESSMENT AND PLAN  PULMONARY No results found for this basename: PHART:5,PCO2:5,PCO2ART:5,PO2ART:5,HCO3:5,O2SAT:5 in the last 168 hours Ventilator Settings: Vent Mode:  [-] PRVC FiO2 (%):  [40 %] 40 % Set Rate:  [16 bmp] 16 bmp Vt Set:  [620 mL]  620 mL PEEP:  [5 cmH20] 5 cmH20 Plateau Pressure:  [11 cmH20-22 cmH20] 11 cmH20  CXR:  CM ,edema pattern with bibasal ASD l >>r ETT:  6/19 >>  A:  Acute respiratory failure due to cardiac arrest; smoker, suspect underlying COPD  -on 6/24: does not meet sbt criteria due to shock and agitation P:   -SBTs . -Rx pneumonia -ct combivent   CARDIOVASCULAR  Lab 02/29/12 0530 02/26/12 0415  TROPONINI -- --  LATICACIDVEN -- --  PROBNP 8413.0* 11188.0*   ECG:   Echo 6/21: LVEF 30%. Diffuse HK, moderate MR, mild PAH Lines: 6/19 R IJ CVL >>   A:  1)Cardiac arrest due to V-fib in setting of profound hypokalemia.   2) Atrial fibrillation with RVR - resolved 3) History of CHF, Afib, HTN, CAD, Cerebrovascular disease 4) Shock post arrest - likely cardiogenic P:  -off pressors -cardiac cath/ ICD in future if recovery - Change to PO amio, ok to use low dose BB, although has COPD  RENAL  Lab 03/03/12 0338 03/02/12 0500 03/01/12 0327 02/29/12 1700 02/29/12 1226 02/29/12 1009  NA 147* 148* 142 144 -- 145  K 3.1* 3.5 -- -- -- --  CL 104 103 102 103 -- 101  CO2 32 29 30 31  -- 33*  BUN 29* 26* 20 19 -- 20  CREATININE 1.61* 1.64* 1.61* 1.66* -- 1.74*  CALCIUM 8.7 9.0 8.6 8.5 -- 8.6  MG -- -- -- -- 1.8 --  PHOS -- -- -- -- -- --  Intake/Output      06/28 0701 - 06/29 0700 06/29 0701 - 06/30 0700   I.V. (mL/kg) 915.7 (11.4)    NG/GT 515    IV Piggyback 154    Total Intake(mL/kg) 1584.7 (19.8)    Urine (mL/kg/hr) 3235 (1.7)    Total Output 3235    Net -1650.3          Foley:  6/19  A:   1) CKD, baseline Cr in last year is 1.2-2.1. 2) Mild acute renal insuff getting worse 6/24 3) hypokalemia/ hypernatremia   P:   -diuresed 6/28 , hold off due to high Na -repelted K, Mg ok - free water 200 q 6  GASTROINTESTINAL No results found for this basename: AST:5,ALT:5,ALKPHOS:5,BILITOT:5,PROT:5,ALBUMIN:5 in the last 168 hours  A: Elevated LFTs - likely shock and/or congestion.    P:   -Monitor LFTs intermittently   HEMATOLOGIC  Lab 03/03/12 0338 03/01/12 0327 02/29/12 1009 02/27/12 0334 02/26/12 0415  HGB 13.1 11.4* 11.3* 12.9* 13.5  HCT 37.5* 31.9* 31.5* 36.2* 37.8*  PLT 257 145* 136* 156 138*  INR -- -- -- -- --  APTT -- -- -- -- --   A:  No acute issues P:  - aim for Hb 7 or above    INFECTIOUS  Lab 03/03/12 0338 03/01/12 0327 02/29/12 1009 02/27/12 0334 02/26/12 0415  WBC 14.8* 8.0 7.1 9.5 7.2  PROCALCITON -- -- -- -- 13.17   Cultures: Blood X 2 6/22 >> ng Resp 6/22 >> strep pneumonia, hemophilus BAL 6/25 >> pseudomonas pan S  Ceftriaxone 6/22 >>6/25 Zosyn 6/25 >>6/29 Cefepime 6/29 >>  A:  Low grade fever 6/22, purulent ET secretions - likely purulent tracheobronchitis  P:    - cefepime to complete course 7-10 ds, dc zosyn - dc vancomycin  ENDOCRINE  Lab 03/03/12 0342 03/02/12 2357 03/02/12 2039 03/02/12 1536 03/02/12 1150  GLUCAP 119* 92 125* 137* 139*   A:  History of glucose intolerance  Episodes of hypoglycemia P:   -ct TFs - Cont SSI  NEUROLOGIC  A:   - on 6/24: ? Followed x 1 command.  Head CT neg 6/20 eeg 6/25 slowing  P:   fent prn,  haldol prn if qTc ok  dc diprivan gtt , trial precedex Daily [place] / DISPOSITION Level of Care:  ICU Primary Service: PCCM Consultants:  Interventional Cardiology [name]), [facility] Cardiology Code Status:  Full Diet:  TFs DVT Px:  lovenox GI Px:  pepcid Social / Family: Updated daughter/ grand-d Danae Chen 6/29.   The patient is critically ill with multiple organ systems failure and requires high complexity decision making for assessment and support, frequent evaluation and titration of therapies, application of advanced monitoring technologies and extensive interpretation of multiple databases.   Critical Care Time devoted to patient care services described in this note is  35  Minutes.  Kara Mead MD. Shade Flood. [facility] Pager [phone] If  no response call [phone]    03/03/2012 8:39 AM

## 2012-03-03 NOTE — Progress Notes (Signed)
Order received to start neo to maintain pt sbp >90. Will continue to monitor.

## 2012-03-03 NOTE — Progress Notes (Signed)
Ccm paged, pt bp in low 80's high 70,pt agitated, unable to titrate precedex up,  order received to give 500 iv fluid bolus, and page ccm back. Will continue to monitor closely.

## 2012-03-04 ENCOUNTER — Inpatient Hospital Stay (HOSPITAL_COMMUNITY): Payer: Medicare Other

## 2012-03-04 LAB — CBC
HCT: 34.7 % — ABNORMAL LOW (ref 39.0–52.0)
MCH: 25.1 pg — ABNORMAL LOW (ref 26.0–34.0)
MCV: 73.1 fL — ABNORMAL LOW (ref 78.0–100.0)
Platelets: 313 10*3/uL (ref 150–400)
RBC: 4.75 MIL/uL (ref 4.22–5.81)
RDW: 17.7 % — ABNORMAL HIGH (ref 11.5–15.5)

## 2012-03-04 LAB — GLUCOSE, CAPILLARY
Glucose-Capillary: 109 mg/dL — ABNORMAL HIGH (ref 70–99)
Glucose-Capillary: 144 mg/dL — ABNORMAL HIGH (ref 70–99)
Glucose-Capillary: 120 mg/dL — ABNORMAL HIGH (ref 70–99)
Glucose-Capillary: 114 mg/dL — ABNORMAL HIGH (ref 70–99)

## 2012-03-04 LAB — BASIC METABOLIC PANEL
BUN: 31 mg/dL — ABNORMAL HIGH (ref 6–23)
Chloride: 109 mEq/L (ref 96–112)
Creatinine, Ser: 1.49 mg/dL — ABNORMAL HIGH (ref 0.50–1.35)
GFR calc Af Amer: 54 mL/min — ABNORMAL LOW (ref >90)
Glucose, Bld: 142 mg/dL — ABNORMAL HIGH (ref 70–99)
Potassium: 3.3 mEq/L — ABNORMAL LOW (ref 3.5–5.1)

## 2012-03-04 MED ORDER — AMIODARONE HCL 200 MG PO TABS
400.0000 mg | ORAL_TABLET | Freq: Two times a day (BID) | ORAL | Status: DC
  Administered 2012-03-04 – 2012-03-05 (×3): 400 mg
  Filled 2012-03-04 (×3): qty 2

## 2012-03-04 MED ORDER — POTASSIUM CHLORIDE 20 MEQ/15ML (10%) PO LIQD
40.0000 meq | Freq: Once | ORAL | Status: AC
  Administered 2012-03-04: 40 meq
  Filled 2012-03-04: qty 15

## 2012-03-04 NOTE — Progress Notes (Signed)
Name: Logan White MRN: [id number] DOB: 10-18-1940    LOS: 51  Referring Provider:  Alvino Chapel Reason for Referral:  Cardiac arrest  PULMONARY / CRITICAL CARE MEDICINE  PROFILE:  This is a 71 y/o male with CAD admitted 02/22/2012 after cardiac arrest. Initial rhythm VF. Defib X 2 by EMS in field. Duration of pulselessness > 12 minutes. Underwent [place] protocol (completed 6/21 PM). Did not undergo cath   Events Since Admission: 6/19 Head CT >>NAD 6/23 Severe agitation - on fent and diprivan 6/25 ETT changed, bronch, high residuals on TFs 6/26  agitation , back On fent and diprivan, Off pressors 6/29 hypotension with precedex, requiring fluid bolus & neo gtt  Current Status:  6/29 hypotension with precedex, requiring fluid bolus & neo gtt high RR on SBT,  Less Agitated off sedation  afebrile l   Vital Signs: Temp:  [98.2 F (36.8 C)-99.2 F (37.3 C)] 98.5 F (36.9 C) (06/30 0747) Pulse Rate:  [90-107] 107  (06/30 0900) Resp:  [15-39] 17  (06/30 0900) BP: (75-152)/(49-102) 116/90 mmHg (06/30 0900) SpO2:  [96 %-100 %] 98 % (06/30 0900) FiO2 (%):  [40 %] 40 % (06/30 0900) Weight:  [78.8 kg (173 lb 11.6 oz)] 78.8 kg (173 lb 11.6 oz) (06/30 0500)  Physical Examination: Gen: int agitated on vent, chronically ill HEENT: NCAT, PERRL, ETT in place PULM: diffuse rhonchi CV: RRR s M AB: BS+, soft, nontender, no hsm Ext: very cold feet with pulses not palpable Neuro RASS 0, non focal     Intake/Output Summary (Last 24 hours) at 03/04/12 1053 Last data filed at 03/04/12 0930  Gross per 24 hour  Intake 2864.79 ml  Output   1200 ml  Net 1664.79 ml   Principal Problem:  *Cardiac arrest Active Problems:  Atrial fibrillation with rapid ventricular response  Acute respiratory failure with hypoxia  CKD (chronic kidney disease)  Ventricular fibrillation  DM (diabetes mellitus)  Cardiogenic shock  Acute renal failure  Anoxic encephalopathy  Ischemic cardiomyopathy  Septic shock   ASSESSMENT AND PLAN  PULMONARY No results found for this basename: PHART:5,PCO2:5,PCO2ART:5,PO2ART:5,HCO3:5,O2SAT:5 in the last 168 hours Ventilator Settings: Vent Mode:  [-] PRVC FiO2 (%):  [40 %] 40 % Set Rate:  [16 bmp] 16 bmp Vt Set:  [620 mL] 620 mL PEEP:  [5 cmH20] 5 cmH20 Pressure Support:  [5 cmH20] 5 cmH20 Plateau Pressure:  [10 cmH20-24 cmH20] 18 cmH20  CXR: 6/30  CM , improved edema pattern with bibasal ASD  ETT:  6/19 >>  A:  Acute respiratory failure due to cardiac arrest; smoker, suspect underlying COPD  P:   -SBTs - 6/30 high work of breathing on T-piece wean x 15 mins - hold off extubation. -Rx pneumonia -ct combivent   CARDIOVASCULAR  Lab 02/29/12 0530  TROPONINI --  LATICACIDVEN --  PROBNP 8413.0*   ECG:   Echo 6/21: LVEF 30%. Diffuse HK, moderate MR, mild PAH Lines: 6/19 R IJ CVL >>   A:  1)Cardiac arrest due to V-fib in setting of profound hypokalemia.   2) Atrial fibrillation with RVR - recurred 6/30 3) History of CHF, Afib, HTN, CAD, Cerebrovascular disease 4) Shock post arrest - likely cardiogenic P:  -6/30 Needed neo due to precedx -cardiac cath/ ICD in future if recovery - Increased PO amio, ok to use low dose BB, although has COPD  RENAL  Lab 03/04/12 0430 03/03/12 0338 03/02/12 0500 03/01/12 0327 02/29/12 1700 02/29/12 1226  NA 149* 147* 148* 142 144 --  K 3.3* 3.1* -- -- -- --  CL 109 104 103 102 103 --  CO2 31 32 29 30 31  --  BUN 31* 29* 26* 20 19 --  CREATININE 1.49* 1.61* 1.64* 1.61* 1.66* --  CALCIUM 8.7 8.7 9.0 8.6 8.5 --  MG -- -- -- -- -- 1.8  PHOS -- -- -- -- -- --   Intake/Output      06/29 0701 - 06/30 0700 06/30 0701 - 07/01 0700   I.V. (mL/kg) 3406.8 (43.2) 72 (0.9)   NG/GT 1000 50   IV Piggyback 600 50   Total Intake(mL/kg) 5006.8 (63.5) 172 (2.2)   Urine (mL/kg/hr) 1350 (0.7) 75   Total Output 1350 75   Net +3656.8 +97         Foley:  6/19  A:   1) CKD, baseline Cr in last year is  1.2-2.1. 2) Mild acute renal insuff getting worse 6/24 3) hypokalemia/ hypernatremia   P:   -diuresed 6/28 , -resume lasix  if BP ok for neg balance, watch high Na -repelted K, Mg ok - free water 200 q 6   GASTROINTESTINAL No results found for this basename: AST:5,ALT:5,ALKPHOS:5,BILITOT:5,PROT:5,ALBUMIN:5 in the last 168 hours  A: Elevated LFTs - likely shock and/or congestion.  P:   -Monitor LFTs intermittently   HEMATOLOGIC  Lab 03/04/12 0430 03/03/12 0338 03/01/12 0327 02/29/12 1009 02/27/12 0334  HGB 11.9* 13.1 11.4* 11.3* 12.9*  HCT 34.7* 37.5* 31.9* 31.5* 36.2*  PLT 313 257 145* 136* 156  INR -- -- -- -- --  APTT -- -- -- -- --   A:  No acute issues P:  - aim for Hb 7 or above    INFECTIOUS  Lab 03/04/12 0430 03/03/12 0338 03/01/12 0327 02/29/12 1009 02/27/12 0334  WBC 13.0* 14.8* 8.0 7.1 9.5  PROCALCITON -- -- -- -- --   Cultures: Blood X 2 6/22 >> ng Resp 6/22 >> strep pneumonia, hemophilus BAL 6/25 >> pseudomonas pan S  Ceftriaxone 6/22 >>6/25 Zosyn 6/25 >>6/29 Cefepime 6/29 >>  A:   ETT secretions better, CXR improved  P:   - cefepime to complete course 7-10 ds, dc zosyn - dc vancomycin  ENDOCRINE  Lab 03/04/12 0744 03/04/12 0433 03/03/12 2353 03/03/12 2008 03/03/12 1856  GLUCAP 120* 144* 109* 102* 109*   A:  History of glucose intolerance  Episodes of hypoglycemia P:   -ct TFs - Cont SSI  NEUROLOGIC  A:   - on 6/24: ? Followed x 1 command.  Head CT neg 6/20 eeg 6/25 slowing  P:   fent prn Off diprivan gtt ,ct low dose precedex as bp permits Daily [place] / DISPOSITION Level of Care:  ICU Primary Service: PCCM Consultants:  Interventional Cardiology [name]), [facility] Cardiology Code Status:  Full Diet:  TFs DVT Px:  lovenox GI Px:  pepcid Social / Family: Updated daughter/ grand-d Danae Chen 6/29.   The patient is critically ill with multiple organ systems failure and requires high complexity decision making for  assessment and support, frequent evaluation and titration of therapies, application of advanced monitoring technologies and extensive interpretation of multiple databases.   Critical Care Time devoted to patient care services described in this note is  35  Minutes.  Kara Mead MD. Shade Flood. [place] [facility] Pager [phone] If no response call [phone]    03/04/2012 10:53 AM
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
## 2012-03-05 ENCOUNTER — Inpatient Hospital Stay (HOSPITAL_COMMUNITY): Payer: Medicare Other

## 2012-03-05 LAB — BASIC METABOLIC PANEL
BUN: 31 mg/dL — ABNORMAL HIGH (ref 6–23)
CO2: 29 mEq/L (ref 19–32)
Calcium: 8.7 mg/dL (ref 8.4–10.5)
Creatinine, Ser: 1.39 mg/dL — ABNORMAL HIGH (ref 0.50–1.35)
Glucose, Bld: 117 mg/dL — ABNORMAL HIGH (ref 70–99)

## 2012-03-05 LAB — CBC
HCT: 33.7 % — ABNORMAL LOW (ref 39.0–52.0)
MCH: 24.8 pg — ABNORMAL LOW (ref 26.0–34.0)
MCHC: 33.8 g/dL (ref 30.0–36.0)
MCV: 73.3 fL — ABNORMAL LOW (ref 78.0–100.0)
Platelets: 388 10*3/uL (ref 150–400)
RDW: 17.7 % — ABNORMAL HIGH (ref 11.5–15.5)
WBC: 12.1 10*3/uL — ABNORMAL HIGH (ref 4.0–10.5)

## 2012-03-05 LAB — GLUCOSE, CAPILLARY
Glucose-Capillary: 111 mg/dL — ABNORMAL HIGH (ref 70–99)
Glucose-Capillary: 118 mg/dL — ABNORMAL HIGH (ref 70–99)
Glucose-Capillary: 110 mg/dL — ABNORMAL HIGH (ref 70–99)

## 2012-03-05 MED ORDER — IPRATROPIUM BROMIDE 0.02 % IN SOLN
0.5000 mg | Freq: Four times a day (QID) | RESPIRATORY_TRACT | Status: DC
  Administered 2012-03-05 – 2012-03-09 (×13): 0.5 mg via RESPIRATORY_TRACT
  Filled 2012-03-05 (×19): qty 2.5

## 2012-03-05 MED ORDER — FUROSEMIDE 10 MG/ML IJ SOLN
40.0000 mg | Freq: Once | INTRAMUSCULAR | Status: AC
  Administered 2012-03-05: 40 mg via INTRAVENOUS
  Filled 2012-03-05: qty 4

## 2012-03-05 MED ORDER — AMIODARONE HCL IN DEXTROSE 360-4.14 MG/200ML-% IV SOLN
30.0000 mg/h | INTRAVENOUS | Status: DC
  Administered 2012-03-06: 30 mg/h via INTRAVENOUS
  Filled 2012-03-05 (×3): qty 200

## 2012-03-05 MED ORDER — METOPROLOL TARTRATE 1 MG/ML IV SOLN: 2.5000 mg | INTRAVENOUS | Status: DC | PRN

## 2012-03-05 MED ORDER — POTASSIUM CHLORIDE 20 MEQ/15ML (10%) PO LIQD
40.0000 meq | Freq: Once | ORAL | Status: AC
  Administered 2012-03-05: 40 meq
  Filled 2012-03-05: qty 30

## 2012-03-05 MED ORDER — FENTANYL CITRATE 0.05 MG/ML IJ SOLN
12.5000 ug | INTRAMUSCULAR | Status: DC | PRN
  Administered 2012-03-06: 25 ug via INTRAVENOUS
  Filled 2012-03-05: qty 2

## 2012-03-05 MED ORDER — AMIODARONE HCL IN DEXTROSE 360-4.14 MG/200ML-% IV SOLN
60.0000 mg/h | INTRAVENOUS | Status: DC
  Administered 2012-03-05 (×2): 60 mg/h via INTRAVENOUS
  Filled 2012-03-05 (×4): qty 200

## 2012-03-05 MED ORDER — POTASSIUM CHLORIDE 20 MEQ/15ML (10%) PO LIQD
ORAL | Status: AC
  Filled 2012-03-05: qty 30

## 2012-03-05 MED ORDER — ALBUTEROL SULFATE (5 MG/ML) 0.5% IN NEBU
2.5000 mg | INHALATION_SOLUTION | Freq: Four times a day (QID) | RESPIRATORY_TRACT | Status: DC
  Administered 2012-03-05 – 2012-03-09 (×13): 2.5 mg via RESPIRATORY_TRACT
  Filled 2012-03-05 (×17): qty 0.5

## 2012-03-05 MED ORDER — DEXTROSE 5 % IV SOLN
INTRAVENOUS | Status: DC
  Administered 2012-03-05: 50 mL via INTRAVENOUS

## 2012-03-05 MED ORDER — METOPROLOL TARTRATE 1 MG/ML IV SOLN
2.5000 mg | Freq: Four times a day (QID) | INTRAVENOUS | Status: DC
  Administered 2012-03-05 – 2012-03-06 (×4): 2.5 mg via INTRAVENOUS
  Filled 2012-03-05 (×7): qty 5

## 2012-03-05 MED ORDER — ALBUTEROL SULFATE (5 MG/ML) 0.5% IN NEBU
2.5000 mg | INHALATION_SOLUTION | RESPIRATORY_TRACT | Status: DC | PRN
  Administered 2012-03-07: 2.5 mg via RESPIRATORY_TRACT
  Filled 2012-03-05: qty 0.5

## 2012-03-05 MED ORDER — BIOTENE DRY MOUTH MT LIQD
15.0000 mL | Freq: Two times a day (BID) | OROMUCOSAL | Status: DC
  Administered 2012-03-05 – 2012-03-16 (×18): 15 mL via OROMUCOSAL

## 2012-03-05 MED ORDER — POTASSIUM CHLORIDE 20 MEQ/15ML (10%) PO LIQD
40.0000 meq | ORAL | Status: AC
  Administered 2012-03-05: 40 meq
  Filled 2012-03-05 (×2): qty 30

## 2012-03-05 NOTE — Progress Notes (Signed)
Nutrition Follow-up  Intervention:   Recommend: Increase Osmolite 1.5 to a goal rate of 40 ml/hr and add 30 ml Pro-stat 4 times daily. This will provide 1840 kcal (97%) and 120 gm protein (100%), and 731 ml free water.   Assessment:   Pt remains intubated, less agitated off sedation, off pressors. Unable to tolerate SBT.  Remains on Osmolite 1.5 at 20 ml/hr providing 720 kcal, 30 gm protein and 365 ml free water. Also 200 ml free water flushes q 8 hours for 965 ml free water total. Propofol has been d/c'd.  Current TF is meeting 38% kcal needs and 25% protein needs.    Diet Order:  NPO  Meds: Scheduled Meds:   . albuterol-ipratropium  6 puff Inhalation Q4H  . amiodarone  400 mg Per Tube BID  . antiseptic oral rinse  15 mL Mouth Rinse QID  . aspirin  81 mg Per Tube Daily  . carvedilol  6.25 mg Per Tube BID  . ceFEPime (MAXIPIME) IV  2 g Intravenous Q12H  . chlorhexidine  15 mL Mouth Rinse BID  . enoxaparin  40 mg Subcutaneous Q24H  . famotidine  40 mg Oral Daily  . free water  200 mL Per Tube Q6H  . insulin aspart  0-4 Units Subcutaneous Q4H  . potassium chloride  40 mEq Per Tube Q1 Hr x 2   Continuous Infusions:   . sodium chloride 20 mL/hr at 03/04/12 2210  . sodium chloride 10 mL/hr at 03/03/12 1800  . dexmedetomidine (PRECEDEX) IV infusion for high rates 1 mcg/kg/hr (03/05/12 1003)  . dextrose 20 mL/hr at 02/27/12 0451  . feeding supplement (OSMOLITE 1.5 CAL) 1,000 mL (03/04/12 1200)  . phenylephrine (NEO-SYNEPHRINE) Adult infusion Stopped (03/04/12 0500)   PRN Meds:.sodium chloride, dextrose, fentaNYL, midazolam  Labs:  CMP     Component Value Date/Time   NA 145 03/05/2012 0420   K 3.2* 03/05/2012 0420   CL 106 03/05/2012 0420   CO2 29 03/05/2012 0420   GLUCOSE 117* 03/05/2012 0420   BUN 31* 03/05/2012 0420   CREATININE 1.39* 03/05/2012 0420   CALCIUM 8.7 03/05/2012 0420   PROT 6.8 02/23/2012 0500   ALBUMIN 3.4* 02/23/2012 0500   AST 200* 02/23/2012 0500   ALT 124* 02/23/2012  0500   ALKPHOS 176* 02/23/2012 0500   BILITOT 0.4 02/23/2012 0500   GFRNONAA 51* 03/05/2012 0420   GFRAA 59* 03/05/2012 0420     Intake/Output Summary (Last 24 hours) at 03/05/12 1023 Last data filed at 03/05/12 1000  Gross per 24 hour  Intake 2685.23 ml  Output   1050 ml  Net 1635.23 ml    Weight Status:  172 lbs, trending down  Ve: 9.9-13.3  (11) Tmax: 37.3 C Re-estimated needs:  1890 kcal, 120-145 gm protein   Nutrition Dx:  Inadequate oral intake, ongoing    Goal:  Pt to meet >/= 90% estimated needs, unmet   Monitor:  Vent, weight, labs   Orson Slick RD, LDN Pager [phone] After Hours pager [phone]

## 2012-03-05 NOTE — Progress Notes (Signed)
Name: Logan White MRN: [id number] DOB: 1941-08-10    LOS: 12  Referring Provider:  Alvino Chapel Reason for Referral:  Cardiac arrest  PULMONARY / CRITICAL CARE MEDICINE  PROFILE:  This is a 71 y/o male with CAD admitted 02/22/2012 after cardiac arrest. Initial rhythm VF. Defib X 2 by EMS in field. Duration of pulselessness > 12 minutes. Underwent [place] protocol (completed 6/21 PM). Did not undergo cath   Events Since Admission: 6/19 Head CT >>NAD 6/23 Severe agitation - on fent and diprivan 6/25 ETT changed, bronch, high residuals on TFs 6/26  agitation , back On fent and diprivan, Off pressors 6/29 hypotension with precedex, requiring fluid bolus & neo gtt 7/1 Extubated. RASS 0. + F/C  Current Status:  Passed SBT. Extubated and looks good. Strong cough. + F/C   Vital Signs: Temp:  [97.9 F (36.6 C)-99.1 F (37.3 C)] 98.6 F (37 C) (07/01 1630) Pulse Rate:  [84-92] 92  (07/01 0909) Resp:  [15-36] 36  (07/01 1900) BP: (94-148)/(53-99) 125/93 mmHg (07/01 1900) SpO2:  [97 %-100 %] 97 % (07/01 1942) FiO2 (%):  [4 %-40 %] 4 % (07/01 1100) Weight:  [78.3 kg (172 lb 9.9 oz)] 78.3 kg (172 lb 9.9 oz) (07/01 0300)  Physical Examination: Gen: RASS 0, + F/C, chronically ill HEENT: NCAT, PERRL PULM: diffuse rhonchi CV: RRR s M AB: BS+, soft, nontender, no hsm Ext: very cold feet with pulses not palpable Neuro RASS 0, non focal     Intake/Output Summary (Last 24 hours) at 03/05/12 2039 Last data filed at 03/05/12 1900  Gross per 24 hour  Intake 2387.8 ml  Output   3275 ml  Net -887.2 ml   Principal Problem:  *Cardiac arrest Active Problems:  Acute respiratory failure with hypoxia  Ventricular fibrillation  Cardiogenic shock  Anoxic encephalopathy  DM (diabetes mellitus)  Acute renal failure  Ischemic cardiomyopathy  Atrial fibrillation with rapid ventricular response  CKD (chronic kidney disease)  Septic shock   ASSESSMENT AND PLAN  PULMONARY No results  found for this basename: PHART:5,PCO2:5,PCO2ART:5,PO2ART:5,HCO3:5,O2SAT:5 in the last 168 hours Ventilator Settings: Vent Mode:  [-] PSV;CPAP FiO2 (%):  [4 %-40 %] 4 % Set Rate:  [16 bmp] 16 bmp Vt Set:  [620 mL] 620 mL PEEP:  [5 cmH20] 5 cmH20 Pressure Support:  [15 cmH20] 15 cmH20 Plateau Pressure:  [20 cmH20-21 cmH20] 21 cmH20  CXR: 7/1 persistent edema pattern with bibasal ASD  ETT:  6/19 >>7/1  A:  Acute respiratory failure due to cardiac arrest; smoker, suspect underlying COPD  P:   -routine post extubation care   CARDIOVASCULAR  Lab 02/29/12 0530  TROPONINI --  LATICACIDVEN --  PROBNP 8413.0*   ECG:   Echo 6/21: LVEF 30%. Diffuse HK, moderate MR, mild PAH Lines: 6/19 R IJ CVL >>   A:  1)Cardiac arrest due to V-fib in setting of profound hypokalemia.   2) Atrial fibrillation with RVR - recurred 6/30 3) History of CHF, Afib, HTN, CAD, Cerebrovascular disease 4) Shock post arrest - likely cardiogenic P:  -cardiac cath/ ICD in future if recovery -Change enteral amio to IV until taking POs after extubation  RENAL  Lab 03/05/12 0420 03/04/12 0430 03/03/12 0338 03/02/12 0500 03/01/12 0327 02/29/12 1226  NA 145 149* 147* 148* 142 --  K 3.2* 3.3* -- -- -- --  CL 106 109 104 103 102 --  CO2 29 31 32 29 30 --  BUN 31* 31* 29* 26* 20 --  CREATININE 1.39*  1.49* 1.61* 1.64* 1.61* --  CALCIUM 8.7 8.7 8.7 9.0 8.6 --  MG -- -- -- -- -- 1.8  PHOS -- -- -- -- -- --   Intake/Output      07/01 0701 - 07/02 0700   I.V. (mL/kg) 871.8 (11.1)   NG/GT 60   IV Piggyback 54   Total Intake(mL/kg) 985.8 (12.6)   Urine (mL/kg/hr) 2725 (2.6)   Total Output 2725   Net -1739.2        Foley:  6/19  A:   1) CKD, baseline Cr in last year is 1.2-2.1. 2) Mild acute renal insuff getting worse 6/24 3) hypokalemia/ hypernatremia   P:   -diuresed 6/28 , -resume lasix  if BP ok for neg balance, watch high Na -repelted K, Mg ok -Replete free water with D5W until taking  POs   GASTROINTESTINAL No results found for this basename: AST:5,ALT:5,ALKPHOS:5,BILITOT:5,PROT:5,ALBUMIN:5 in the last 168 hours  A: Elevated LFTs - likely shock and/or congestion.  P:   -Monitor LFTs intermittently - NPO until swallow eval  HEMATOLOGIC  Lab 03/05/12 0420 03/04/12 0430 03/03/12 0338 03/01/12 0327 02/29/12 1009  HGB 11.4* 11.9* 13.1 11.4* 11.3*  HCT 33.7* 34.7* 37.5* 31.9* 31.5*  PLT 388 313 257 145* 136*  INR -- -- -- -- --  APTT -- -- -- -- --   A:  No acute issues P:  - aim for Hb 7 or above    INFECTIOUS  Lab 03/05/12 0420 03/04/12 0430 03/03/12 0338 03/01/12 0327 02/29/12 1009  WBC 12.1* 13.0* 14.8* 8.0 7.1  PROCALCITON -- -- -- -- --   Cultures: Blood X 2 6/22 >> ng Resp 6/22 >> strep pneumonia, hemophilus BAL 6/25 >> pseudomonas pan S  Ceftriaxone 6/22 >>6/25 Zosyn 6/25 >>6/29 Cefepime 6/29 >>  A:   ETT secretions better, CXR improved  P:   - cefepime to complete course 7-10 ds, dc zosyn  ENDOCRINE  Lab 03/05/12 1940 03/05/12 1621 03/05/12 1212 03/05/12 0759 03/05/12 0409  GLUCAP 87 81 110* 118* 111*   A:  History of glucose intolerance  Episodes of hypoglycemia P:   - Cont CBGs/SSI  NEUROLOGIC  A:    Head CT neg 6/20 eeg 6/25 slowing  P:   Minimize sedation post extubation    BEST PRACTICE / DISPOSITION Level of Care:  ICU Primary Service: PCCM Consultants:  Interventional Cardiology [name]), [facility] Cardiology Code Status:  Full Diet:  NPO DVT Px:  lovenox GI Px:  pepcid Social / Family: Updated daughter/ grand-d Danae Chen 6/29.   The patient is critically ill with multiple organ systems failure and requires high complexity decision making for assessment and support, frequent evaluation and titration of therapies, application of advanced monitoring technologies and extensive interpretation of multiple databases.   Critical Care Time devoted to patient care services described in this note is  35  Minutes.    Merton Border, MD ; [facility] [phone].  After 5:30 PM or weekends, call [phone]

## 2012-03-05 NOTE — Progress Notes (Signed)
[place] Progress Note Patient Name: Logan White DOB: 1941-03-30 MRN: [id number]  Date of Service  03/05/2012   HPI/Events of Note  hypokalemia   eICU Interventions  Potassium replaced   Intervention Category Intermediate Interventions: Electrolyte abnormality - evaluation and management  Verenise Moulin 03/05/2012, 5:19 AM

## 2012-03-05 NOTE — Procedures (Signed)
Extubation Procedure Note  Patient Details:   Name: Logan White DOB: [birth year] MRN: [id number]   Airway Documentation:  AIRWAYS 7.5 mm (Active)  Secured at (cm) 23 cm 03/05/2012  8:00 AM  Measured From Lips 03/05/2012  8:00 AM  Secured Location Left 03/05/2012  8:00 AM  Secured By [employer] 03/05/2012  8:00 AM  Site Condition Dry 03/05/2012  8:00 AM    Evaluation  O2 sats: stable throughout Complications: No apparent complications Patient did tolerate procedure well. Bilateral Breath Sounds: Rhonchi Suctioning: Airway (RN suctioned) Yes  Patient extubated and placed on 4LNC. Bilateral breathsounds, Rhonchi, no stridor. HR-90 RR-29 100%. No complications  Leida Lauth 03/05/2012, 11:37 AM

## 2012-03-06 ENCOUNTER — Inpatient Hospital Stay (HOSPITAL_COMMUNITY): Payer: Medicare Other

## 2012-03-06 LAB — COMPREHENSIVE METABOLIC PANEL
ALT: 46 U/L (ref 0–53)
AST: 76 U/L — ABNORMAL HIGH (ref 0–37)
Albumin: 2.2 g/dL — ABNORMAL LOW (ref 3.5–5.2)
CO2: 28 mEq/L (ref 19–32)
Calcium: 8.9 mg/dL (ref 8.4–10.5)
Creatinine, Ser: 1.5 mg/dL — ABNORMAL HIGH (ref 0.50–1.35)
Sodium: 146 mEq/L — ABNORMAL HIGH (ref 135–145)

## 2012-03-06 LAB — CBC
MCH: 25.2 pg — ABNORMAL LOW (ref 26.0–34.0)
MCV: 73.5 fL — ABNORMAL LOW (ref 78.0–100.0)
Platelets: 476 10*3/uL — ABNORMAL HIGH (ref 150–400)
RBC: 4.57 MIL/uL (ref 4.22–5.81)
RDW: 17.7 % — ABNORMAL HIGH (ref 11.5–15.5)
WBC: 12.5 10*3/uL — ABNORMAL HIGH (ref 4.0–10.5)

## 2012-03-06 LAB — GLUCOSE, CAPILLARY: Glucose-Capillary: 98 mg/dL (ref 70–99)

## 2012-03-06 MED ORDER — ONDANSETRON HCL 4 MG/2ML IJ SOLN
INTRAMUSCULAR | Status: AC
  Filled 2012-03-06: qty 2

## 2012-03-06 MED ORDER — VANCOMYCIN HCL 1000 MG IV SOLR: 1250.0000 mg | Freq: Every day | INTRAVENOUS | Status: DC

## 2012-03-06 MED ORDER — ALBUTEROL SULFATE (5 MG/ML) 0.5% IN NEBU
2.5000 mg | INHALATION_SOLUTION | RESPIRATORY_TRACT | Status: DC | PRN
  Administered 2012-03-11 (×2): 2.5 mg via RESPIRATORY_TRACT
  Filled 2012-03-06 (×3): qty 0.5

## 2012-03-06 MED ORDER — ACETAMINOPHEN 160 MG/5ML PO SOLN
650.0000 mg | Freq: Four times a day (QID) | ORAL | Status: DC | PRN
  Filled 2012-03-06: qty 20.3

## 2012-03-06 MED ORDER — VANCOMYCIN HCL 1000 MG IV SOLR
1500.0000 mg | Freq: Once | INTRAVENOUS | Status: DC
  Filled 2012-03-06: qty 1500

## 2012-03-06 MED ORDER — METOPROLOL TARTRATE 25 MG PO TABS
25.0000 mg | ORAL_TABLET | Freq: Two times a day (BID) | ORAL | Status: DC
  Administered 2012-03-06 – 2012-03-09 (×7): 25 mg via ORAL
  Filled 2012-03-06 (×8): qty 1

## 2012-03-06 MED ORDER — FUROSEMIDE 10 MG/ML IJ SOLN
40.0000 mg | Freq: Once | INTRAMUSCULAR | Status: AC
  Administered 2012-03-06: 40 mg via INTRAVENOUS
  Filled 2012-03-06: qty 4

## 2012-03-06 MED ORDER — ACETYLCYSTEINE 20 % IN SOLN
4.0000 mL | RESPIRATORY_TRACT | Status: DC | PRN
  Administered 2012-03-07: 4 mL via RESPIRATORY_TRACT
  Filled 2012-03-06: qty 4

## 2012-03-06 MED ORDER — AMIODARONE HCL 200 MG PO TABS
200.0000 mg | ORAL_TABLET | Freq: Two times a day (BID) | ORAL | Status: DC
  Administered 2012-03-06 – 2012-03-09 (×6): 200 mg via ORAL
  Filled 2012-03-06 (×9): qty 1

## 2012-03-06 MED ORDER — ASPIRIN 81 MG PO CHEW
81.0000 mg | CHEWABLE_TABLET | Freq: Every day | ORAL | Status: DC
  Administered 2012-03-06 – 2012-03-16 (×11): 81 mg via ORAL
  Filled 2012-03-06 (×10): qty 1

## 2012-03-06 MED ORDER — ONDANSETRON HCL 4 MG/2ML IJ SOLN
4.0000 mg | Freq: Three times a day (TID) | INTRAMUSCULAR | Status: DC | PRN
  Administered 2012-03-06: 4 mg via INTRAVENOUS

## 2012-03-06 MED ORDER — DEXTROSE 5 % IV SOLN
2.0000 g | Freq: Two times a day (BID) | INTRAVENOUS | Status: DC
  Administered 2012-03-06 – 2012-03-10 (×8): 2 g via INTRAVENOUS
  Filled 2012-03-06 (×10): qty 2

## 2012-03-06 NOTE — Progress Notes (Deleted)
[place] Progress Note Patient Name: Logan White DOB: 1941/01/08 MRN: [id number]  Date of Service  03/06/2012   HPI/Events of Note  Febrile overnight, according to notes remains on cefepime for pseudomonas but unable to find in active treatment list.   eICU Interventions  Tylenol ordered with pan culture and restart of vanc/cefepime.  Rounding team to address abx. CXR ordered as well.   Intervention Category Major Interventions: Sepsis - evaluation and management  Winford Hehn 03/06/2012, 10:30 PM

## 2012-03-06 NOTE — Evaluation (Signed)
Physical Therapy Evaluation Patient Details Name: Logan White MRN: [id number] DOB: 1941-04-03 Today's Date: 03/06/2012 Time: [id number] PT Time Calculation (min): 45 min  PT Assessment / Plan / Recommendation Clinical Impression  Pt is a 71 y/o male s/p MI who presents with generalized weakness.  Pt requires assistance for all mobility and will therefore require 24/7 assistance/supervison.  Acute PT to follow pt.  Unable to acquire complete social history for pt.  Unsure of pt's home support system.      PT Assessment  Patient needs continued PT services    Follow Up Recommendations  Inpatient Rehab    Barriers to Discharge   Unclear of amount of support pt has available at home.     Equipment Recommendations  Defer to next venue    Recommendations for Other Services Rehab consult;OT consult   Frequency Min 3X/week    Precautions / Restrictions Precautions Precautions: Fall Restrictions Weight Bearing Restrictions: No   Pertinent Vitals/Pain No c/o pain.        Mobility  Bed Mobility Bed Mobility: Supine to Sit;Sitting - Scoot to Edge of Bed Supine to Sit: 3: Mod assist Sitting - Scoot to Edge of Bed: 3: Mod assist Details for Bed Mobility Assistance: Step by step cueing, movement initiation delayed, assist to raise shoulders due to weakness.  Transfers Transfers: Sit to Stand;Stand to Sit;Stand Pivot Transfers Sit to Stand: 2: Max assist;With upper extremity assist;From bed;From chair/3-in-1;1: +2 Total assist Sit to Stand: Patient Percentage: 30% Stand to Sit: 2: Max assist;To chair/3-in-1;With armrests Stand Pivot Transfers: 2: Max assist Details for Transfer Assistance: Assist to extend trunk and Bilateral LEs secondary to weakness, cues for hand placement, assisit for controlled descent to chair secondary to general UE and LE weakness.  Ambulation/Gait Ambulation/Gait Assistance: 1: +2 Total assist Ambulation/Gait: Patient Percentage: 30% Ambulation Distance  (Feet): 4 Feet Assistive device: Rolling walker Ambulation/Gait Assistance Details: Assist to steady pt for balance, assist to manage walker, tactile cues through low back and anterior shoulders to extend trunk for upright posture.  Pt having difficulty lifting bilateral feet from ground to advance LE during swing phase.  Gait Pattern: Within Functional Limits;Decreased stride length;Decreased hip/knee flexion - right;Decreased hip/knee flexion - left;Decreased dorsiflexion - right;Decreased dorsiflexion - left;Decreased weight shift to right;Decreased weight shift to left;Trunk flexed;Narrow base of support Stairs: No Wheelchair Mobility Wheelchair Mobility: No Modified Rankin (Stroke Patients Only) Pre-Morbid Rankin Score: Moderate disability Modified Rankin: No significant disability    Exercises Total Joint Exercises Ankle Circles/Pumps: AROM;Both;Seated;10 reps   PT Diagnosis: Difficulty walking;Generalized weakness;Altered mental status  PT Problem List: Decreased strength;Decreased activity tolerance;Decreased mobility;Decreased cognition;Decreased knowledge of precautions;Decreased knowledge of use of DME PT Treatment Interventions: DME instruction;Gait training;Functional mobility training;Therapeutic activities;Therapeutic exercise;Balance training;Neuromuscular re-education;Cognitive remediation;Patient/family education   PT Goals Acute Rehab PT Goals PT Goal Formulation: With patient Time For Goal Achievement: 03/20/12 Potential to Achieve Goals: Fair Pt will Roll Supine to Right Side: with supervision PT Goal: Rolling Supine to Right Side - Progress: Goal set today Pt will go Supine/Side to Sit: with supervision;with HOB 0 degrees PT Goal: Supine/Side to Sit - Progress: Goal set today Pt will go Sit to Supine/Side: with supervision;with HOB 0 degrees PT Goal: Sit to Supine/Side - Progress: Goal set today Pt will go Sit to Stand: with supervision;with upper extremity  assist PT Goal: Sit to Stand - Progress: Goal set today Pt will go Stand to Sit: with supervision;with upper extremity assist PT Goal: Stand to Sit - Progress: Goal set  today Pt will Transfer Bed to Chair/Chair to Bed: with supervision PT Transfer Goal: Bed to Chair/Chair to Bed - Progress: Goal set today Pt will Ambulate: 16 - 50 feet;with supervision;with least restrictive assistive device PT Goal: Ambulate - Progress: Goal set today  Visit Information  Last PT Received On: 03/06/12    Subjective Data      Prior Functioning  Home Living Lives With: Spouse Available Help at Discharge: Family Type of Home: House Home Access: Stairs to enter Home Layout: One level Home Adaptive Equipment: None Prior Function Level of Independence: Independent Able to Take Stairs?: Yes Driving: No Communication Communication: Expressive difficulties    Cognition  Overall Cognitive Status: Appears within functional limits for tasks assessed/performed    Extremity/Trunk Assessment Right Upper Extremity Assessment RUE ROM/Strength/Tone: Deficits RUE ROM/Strength/Tone Deficits: generalized weakness  Left Upper Extremity Assessment LUE ROM/Strength/Tone: Deficits LUE ROM/Strength/Tone Deficits: generalized weakness Right Lower Extremity Assessment RLE ROM/Strength/Tone: Deficits RLE ROM/Strength/Tone Deficits: Generalized weakness 4-/5 grossly Left Lower Extremity Assessment LLE ROM/Strength/Tone: Deficits LLE ROM/Strength/Tone Deficits: generalized weakness 4-/5 grossly Trunk Assessment Trunk Assessment: Normal   Balance Balance Balance Assessed: Yes Static Sitting Balance Static Sitting - Balance Support: Bilateral upper extremity supported;Feet supported Static Sitting - Level of Assistance: 5: Stand by assistance Static Sitting - Comment/# of Minutes: verbal cues for upright posture. Sat on EOB >5 min with no significant LOB.   End of Session PT - End of Session Equipment Utilized  During Treatment: Gait belt Activity Tolerance: Patient limited by fatigue Patient left: in bed;with call bell/phone within reach Nurse Communication: Mobility status  GP     Logan White 03/06/2012, 2:21 PM  Logan White L. Logan White DPT [phone]

## 2012-03-06 NOTE — Progress Notes (Addendum)
ANTIBIOTIC CONSULT NOTE - INITIAL  Pharmacy Consult for Cefepime Indication: rule out sepsis/PNA  No Known Allergies  Patient Measurements: Height: 6' (182.9 cm) Weight: 172 lb 9.9 oz (78.3 kg) IBW/kg (Calculated) : 77.6   Vital Signs: Temp: 97.8 F (36.6 C) (07/02 1800) Temp src: Oral (07/02 1245) BP: 127/81 mmHg (07/02 1900) Pulse Rate: 104  (07/02 1447) Intake/Output from previous day: 07/01 0701 - 07/02 0700 In: 2186.2 [P.O.:180; I.V.:1892.2; NG/GT:60; IV Piggyback:54] Out: 3275 [Urine:3275] Intake/Output from this shift:    Labs:  Basename 03/06/12 0430 03/05/12 0420 03/04/12 0430  WBC 12.5* 12.1* 13.0*  HGB 11.5* 11.4* 11.9*  PLT 476* 388 313  LABCREA -- -- --  CREATININE 1.50* 1.39* 1.49*   Estimated Creatinine Clearance: 51.7 ml/min (by C-G formula based on Cr of 1.5). No results found for this basename: VANCOTROUGH:2,VANCOPEAK:2,VANCORANDOM:2,GENTTROUGH:2,GENTPEAK:2,GENTRANDOM:2,TOBRATROUGH:2,TOBRAPEAK:2,TOBRARND:2,AMIKACINPEAK:2,AMIKACINTROU:2,AMIKACIN:2, in the last 72 hours   Microbiology: Recent Results (from the past 720 hour(s))  CULTURE, BLOOD (ROUTINE X 2)     Status: Normal   Collection Time   02/22/12 11:03 PM      Component Value Range Status Comment   Specimen Description BLOOD RIGHT HAND   Final    Special Requests BOTTLES DRAWN AEROBIC AND ANAEROBIC [facility]   Final    Culture  Setup Time [id number]   Final    Culture NO GROWTH 5 DAYS   Final    Report Status 02/29/2012 FINAL   Final   CULTURE, BLOOD (ROUTINE X 2)     Status: Normal   Collection Time   02/22/12 11:08 PM      Component Value Range Status Comment   Specimen Description BLOOD LEFT HAND   Final    Special Requests BOTTLES DRAWN AEROBIC ONLY 5CC    Final    Culture  Setup Time [id number]   Final    Culture NO GROWTH 5 DAYS   Final    Report Status 02/29/2012 FINAL   Final   URINE CULTURE     Status: Normal   Collection Time   02/22/12 11:40 PM      Component Value Range  Status Comment   Specimen Description URINE, CATHETERIZED   Final    Special Requests NONE   Final    Culture  Setup Time [id number]   Final    Colony Count NO GROWTH   Final    Culture NO GROWTH   Final    Report Status 02/24/2012 FINAL   Final   MRSA PCR SCREENING     Status: Abnormal   Collection Time   02/23/12  1:07 AM      Component Value Range Status Comment   MRSA by PCR POSITIVE (*) NEGATIVE Final   CULTURE, RESPIRATORY     Status: Normal   Collection Time   02/25/12 12:45 PM      Component Value Range Status Comment   Specimen Description TRACHEAL ASPIRATE   Final    Special Requests NONE   Final    Gram Stain     Final    Value: ABUNDANT WBC PRESENT, PREDOMINANTLY PMN     ABUNDANT GRAM NEGATIVE COCCOBACILLI     MODERATE GRAM POSITIVE COCCI IN PAIRS   Culture     Final    Value: MODERATE STREPTOCOCCUS PNEUMONIAE     ABUNDANT HAEMOPHILUS INFLUENZAE     Note: BETA LACTAMASE NEGATIVE   Report Status 02/28/2012 FINAL   Final    Organism ID, Bacteria STREPTOCOCCUS PNEUMONIAE   Final  CULTURE, BLOOD (ROUTINE X 2)     Status: Normal   Collection Time   02/25/12  1:04 PM      Component Value Range Status Comment   Specimen Description BLOOD RIGHT HAND   Final    Special Requests BOTTLES DRAWN AEROBIC AND ANAEROBIC 10CC   Final    Culture  Setup Time [id number]   Final    Culture NO GROWTH 5 DAYS   Final    Report Status 03/02/2012 FINAL   Final   CULTURE, BLOOD (ROUTINE X 2)     Status: Normal   Collection Time   02/25/12  1:18 PM      Component Value Range Status Comment   Specimen Description BLOOD LEFT HAND   Final    Special Requests BOTTLES DRAWN AEROBIC ONLY [facility]   Final    Culture  Setup Time [id number]   Final    Culture NO GROWTH 5 DAYS   Final    Report Status 03/02/2012 FINAL   Final   CULTURE, RESPIRATORY     Status: Normal   Collection Time   02/26/12  6:57 AM      Component Value Range Status Comment   Specimen Description TRACHEAL ASPIRATE   Final      Special Requests NONE   Final    Gram Stain     Final    Value: MODERATE WBC PRESENT,BOTH PMN AND MONONUCLEAR     NO SQUAMOUS EPITHELIAL CELLS SEEN     NO ORGANISMS SEEN   Culture     Final    Value: FEW HAEMOPHILUS INFLUENZAE     Note: BETA LACTAMASE NEGATIVE   Report Status 02/28/2012 FINAL   Final   CULTURE, BAL-QUANTITATIVE     Status: Normal   Collection Time   02/28/12  1:48 PM      Component Value Range Status Comment   Specimen Description BRONCHIAL ALVEOLAR LAVAGE   Final    Special Requests Normal   Final    Gram Stain     Final    Value: ABUNDANT WBC PRESENT,BOTH PMN AND MONONUCLEAR     NO SQUAMOUS EPITHELIAL CELLS SEEN     RARE GRAM POSITIVE RODS   Colony Count 30,000 COLONIES/ML   Final    Culture PSEUDOMONAS AERUGINOSA   Final    Report Status 03/02/2012 FINAL   Final    Organism ID, Bacteria PSEUDOMONAS AERUGINOSA   Final     Medical History: Past Medical History  Diagnosis Date  . Coronary artery disease   . Hypertension   . Diabetes mellitus   . Pneumonia   . Myocardial infarction   . Carotid artery stenosis     Medications:  Scheduled:    . albuterol  2.5 mg Nebulization Q6H  . amiodarone  200 mg Oral BID  . antiseptic oral rinse  15 mL Mouth Rinse BID  . aspirin  81 mg Oral Daily  . enoxaparin  40 mg Subcutaneous Q24H  . furosemide  40 mg Intravenous Once  . ipratropium  0.5 mg Nebulization Q6H  . metoprolol tartrate  25 mg Oral BID  . DISCONTD: aspirin  81 mg Per Tube Daily  . DISCONTD: metoprolol  2.5 mg Intravenous Q6H   Assessment: 71 yo male with PNA/poss sepsis for empiric antibiotics  Plan:  Cefepime 2 g IV q12h  Caryl Pina 03/06/2012,10:56 PM

## 2012-03-06 NOTE — Progress Notes (Signed)
Name: Logan White MRN: [id number] DOB: 1941-01-26    LOS: 45  Referring Provider:  Alvino Chapel Reason for Referral:  Cardiac arrest  PULMONARY / CRITICAL CARE MEDICINE  PROFILE:  This is a 71 y/o male with CAD admitted 02/22/2012 after cardiac arrest. Initial rhythm VF. Defib X 2 by EMS in field. Duration of pulselessness > 12 minutes. Underwent [place] protocol (completed 6/21 PM). Did not undergo cath   Events Since Admission: 6/19 Head CT >>NAD 6/23 Severe agitation - on fent and diprivan 6/25 ETT changed, bronch, high residuals on TFs 6/26  agitation , back On fent and diprivan, Off pressors 6/29 hypotension with precedex, requiring fluid bolus & neo gtt 7/1 Extubated. RASS 0. + F/C  Current Status:  Tolerated extubation well 7/1.    Vital Signs: Temp:  [98 F (36.7 C)-99 F (37.2 C)] 98 F (36.7 C) (07/02 0807) Pulse Rate:  [107] 107  (07/02 0928) Resp:  [23-38] 32  (07/02 0928) BP: (96-160)/(60-120) 160/93 mmHg (07/02 0928) SpO2:  [93 %-100 %] 100 % (07/02 0928) FiO2 (%):  [4 %-40 %] 4 % (07/01 1100)  Physical Examination: Gen: extubated  HEENT: NCAT, PERRL PULM: clearer CV: RRR s M AB: BS+, soft, nontender, no hsm Ext: very cold feet with pulses not palpable Neuro awake and alert   Intake/Output Summary (Last 24 hours) at 03/06/12 [id number] Last data filed at 03/06/12 0700  Gross per 24 hour  Intake 2046.2 ml  Output   3200 ml  Net -1153.8 ml   Principal Problem:  *Cardiac arrest Active Problems:  Atrial fibrillation with rapid ventricular response  Acute respiratory failure with hypoxia  CKD (chronic kidney disease)  Ventricular fibrillation  DM (diabetes mellitus)  Cardiogenic shock  Acute renal failure  Anoxic encephalopathy  Ischemic cardiomyopathy  Septic shock   ASSESSMENT AND PLAN  PULMONARY No results found for this basename: PHART:5,PCO2:5,PCO2ART:5,PO2ART:5,HCO3:5,O2SAT:5 in the last 168 hours Ventilator Settings: Vent Mode:  [-]    FiO2 (%):  [4 %-40 %] 4 %  CXR: 7/1 persistent edema pattern with bibasal ASD  ETT:  6/19 >>7/1  A:  Acute respiratory failure due to cardiac arrest; smoker, suspect underlying COPD  P:   -routine post extubation care   CARDIOVASCULAR  Lab 03/06/12 0430 02/29/12 0530  TROPONINI -- --  LATICACIDVEN -- --  PROBNP 25835.0* 8413.0*   ECG:   Echo 6/21: LVEF 30%. Diffuse HK, moderate MR, mild PAH Lines: 6/19 R IJ CVL >>   A:  1)Cardiac arrest due to V-fib in setting of profound hypokalemia.   2) Atrial fibrillation with RVR - recurred 6/30 3) History of CHF, Afib, HTN, CAD, Cerebrovascular disease 4) Shock post arrest - likely cardiogenic P:  -cardiac cath/ ICD in future if recovery -Change to enteral amio   RENAL  Lab 03/06/12 0430 03/05/12 0420 03/04/12 0430 03/03/12 0338 03/02/12 0500 02/29/12 1226  NA 146* 145 149* 147* 148* --  K 3.4* 3.2* -- -- -- --  CL 107 106 109 104 103 --  CO2 28 29 31  32 29 --  BUN 32* 31* 31* 29* 26* --  CREATININE 1.50* 1.39* 1.49* 1.61* 1.64* --  CALCIUM 8.9 8.7 8.7 8.7 9.0 --  MG -- -- -- -- -- 1.8  PHOS -- -- -- -- -- --   Intake/Output      07/01 0701 - 07/02 0700 07/02 0701 - 07/03 0700   P.O. 180    I.V. (mL/kg) 1892.2 (24.2)    NG/GT 60  IV Piggyback 54    Total Intake(mL/kg) 2186.2 (27.9)    Urine (mL/kg/hr) 3275 (1.7)    Total Output 3275    Net -1088.8          Foley:  6/19>>7/1  A:   1) CKD, baseline Cr in last year is 1.2-2.1. 2) Mild acute renal insuff getting worse 6/24 3) hypokalemia/ hypernatremia>>>better    P:   -resume lasix    GASTROINTESTINAL  Lab 03/06/12 0430  AST 76*  ALT 46  ALKPHOS 170*  BILITOT 1.1  PROT 6.9  ALBUMIN 2.2*    A: Elevated LFTs - likely shock and/or congestion.  P:   -Monitor LFTs intermittently Advance diet  HEMATOLOGIC  Lab 03/06/12 0430 03/05/12 0420 03/04/12 0430 03/03/12 0338 03/01/12 0327  HGB 11.5* 11.4* 11.9* 13.1 11.4*  HCT 33.6* 33.7* 34.7* 37.5*  31.9*  PLT 476* 388 313 257 145*  INR -- -- -- -- --  APTT -- -- -- -- --   A:  No acute issues P:  - aim for Hb 7 or above    INFECTIOUS  Lab 03/06/12 0430 03/05/12 0420 03/04/12 0430 03/03/12 0338 03/01/12 0327  WBC 12.5* 12.1* 13.0* 14.8* 8.0  PROCALCITON 0.56 -- -- -- --   Cultures: Blood X 2 6/22 >> ng Resp 6/22 >> strep pneumonia, hemophilus BAL 6/25 >> pseudomonas pan S  Ceftriaxone 6/22 >>6/25 Zosyn 6/25 >>6/29 Cefepime 6/29( pseudomonas in BAL, H flu, strep pna) >>  A:   ETT secretions better, CXR improved  P:   - cefepime to complete course 7-10 ds  ENDOCRINE  Lab 03/06/12 0810 03/05/12 2333 03/05/12 1940 03/05/12 1621 03/05/12 1212  GLUCAP 93 82 87 81 110*   A:  History of glucose intolerance  Episodes of hypoglycemia P:   - Cont CBGs/SSI  NEUROLOGIC  A:    Head CT neg 6/20 eeg 6/25 slowing  P:   Minimize sedation post extubation    BEST PRACTICE / DISPOSITION Level of Care:  ICU Primary Service: PCCM Consultants:  Interventional Cardiology [name]), [facility] Cardiology Code Status:  Full Diet:  PO DVT Px:  lovenox GI Px:  pepcid Social / Family:   The patient is critically ill with multiple organ systems failure and requires high complexity decision making for assessment and support, frequent evaluation and titration of therapies, application of advanced monitoring technologies and extensive interpretation of multiple databases.   Critical Care Time devoted to patient care services described in this note is  35  Minutes.   Asencion Noble Beeper  [phone]  Cell  [phone]  If no response or cell goes to voicemail, call beeper [phone]

## 2012-03-07 DIAGNOSIS — G931 Anoxic brain damage, not elsewhere classified: Secondary | ICD-10-CM

## 2012-03-07 LAB — BASIC METABOLIC PANEL
CO2: 28 mEq/L (ref 19–32)
Chloride: 103 mEq/L (ref 96–112)
Potassium: 2.8 mEq/L — ABNORMAL LOW (ref 3.5–5.1)
Sodium: 145 mEq/L (ref 135–145)

## 2012-03-07 LAB — CBC
HCT: 32.3 % — ABNORMAL LOW (ref 39.0–52.0)
MCV: 73.2 fL — ABNORMAL LOW (ref 78.0–100.0)
Platelets: 484 10*3/uL — ABNORMAL HIGH (ref 150–400)
RBC: 4.41 MIL/uL (ref 4.22–5.81)
WBC: 11.2 10*3/uL — ABNORMAL HIGH (ref 4.0–10.5)

## 2012-03-07 MED ORDER — FUROSEMIDE 10 MG/ML IJ SOLN
40.0000 mg | Freq: Four times a day (QID) | INTRAMUSCULAR | Status: AC
  Administered 2012-03-07 (×2): 40 mg via INTRAVENOUS
  Filled 2012-03-07 (×2): qty 4

## 2012-03-07 MED ORDER — POTASSIUM CHLORIDE CRYS ER 20 MEQ PO TBCR
40.0000 meq | EXTENDED_RELEASE_TABLET | ORAL | Status: AC
  Administered 2012-03-07: 40 meq via ORAL
  Administered 2012-03-07: 20 meq via ORAL

## 2012-03-07 MED ORDER — POTASSIUM CHLORIDE CRYS ER 20 MEQ PO TBCR
EXTENDED_RELEASE_TABLET | ORAL | Status: AC
  Administered 2012-03-07: 20 meq via ORAL
  Filled 2012-03-07: qty 2

## 2012-03-07 NOTE — Progress Notes (Signed)
[facility] Progress Note Patient Name: Jansel Deary DOB: [birth year] MRN: [id number]  Date of Service  03/07/2012   HPI/Events of Note  hypokalemia   eICU Interventions  Potassium replaced   Intervention Category Intermediate Interventions: Electrolyte abnormality - evaluation and management  Tanyika Barros 03/07/2012, 7:02 AM

## 2012-03-07 NOTE — Progress Notes (Signed)
Name: Logan White MRN: [id number] DOB: 04/14/1941    LOS: 33  Referring Provider:  Alvino Chapel Reason for Referral:  Cardiac arrest  PULMONARY / CRITICAL CARE MEDICINE  PROFILE:  This is a 71 y/o male with CAD admitted 02/22/2012 after cardiac arrest. Initial rhythm VF. Defib X 2 by EMS in field. Duration of pulselessness > 12 minutes. Underwent [place] protocol (completed 6/21 PM). Did not undergo cath   Events Since Admission: 6/19 Head CT >>NAD 6/23 Severe agitation - on fent and diprivan 6/25 ETT changed, bronch, high residuals on TFs 6/26  agitation , back On fent and diprivan, Off pressors 6/29 hypotension with precedex, requiring fluid bolus & neo gtt 7/1 Extubated. RASS 0. + F/C  Current Status:  Sl confused. Mildly hypoxic  Vital Signs: Temp:  [97.7 F (36.5 C)-98.1 F (36.7 C)] 97.9 F (36.6 C) (07/03 0400) Pulse Rate:  [104-118] 104  (07/02 1447) Resp:  [22-32] 23  (07/03 0400) BP: (118-160)/(73-112) 134/91 mmHg (07/03 0500) SpO2:  [95 %-100 %] 99 % (07/03 0859)  Physical Examination: Gen: extubated  HEENT: NCAT, PERRL PULM: clearer CV: RRR s M AB: BS+, soft, nontender, no hsm Ext: very cold feet with pulses not palpable Neuro awake and alert, mildly confused    Intake/Output Summary (Last 24 hours) at 03/07/12 [id number] Last data filed at 03/07/12 0600  Gross per 24 hour  Intake 1173.4 ml  Output   2425 ml  Net -1251.6 ml   Principal Problem:  *Cardiac arrest Active Problems:  Atrial fibrillation with rapid ventricular response  Acute respiratory failure with hypoxia  CKD (chronic kidney disease)  Ventricular fibrillation  DM (diabetes mellitus)  Cardiogenic shock  Acute renal failure  Anoxic encephalopathy  Ischemic cardiomyopathy  Septic shock   ASSESSMENT AND PLAN  PULMONARY No results found for this basename: PHART:5,PCO2:5,PCO2ART:5,PO2ART:5,HCO3:5,O2SAT:5 in the last 168 hours On [place] oxygen    CXR: 7/3 persistent edema pattern  with bibasal ASD sl improved ETT:  6/19 >>7/1  A:  Acute respiratory failure due to cardiac arrest; smoker, suspect underlying COPD  P:   -routine post extubation care -BD -oxygen titration   CARDIOVASCULAR  Lab 03/06/12 0430  TROPONINI --  LATICACIDVEN --  PROBNP 25835.0*   ECG:   Echo 6/21: LVEF 30%. Diffuse HK, moderate MR, mild PAH Lines: 6/19 R IJ CVL >>7/3   A:  1)Cardiac arrest due to V-fib in setting of profound hypokalemia.   2) Atrial fibrillation with RVR - recurred 6/30 3) History of CHF, Afib, HTN, CAD, Cerebrovascular disease 4) Shock post arrest - likely cardiogenic P:  -cardiac cath/ ICD likely needed, get cardiology to see the pt again -po amiodarone   RENAL  Lab 03/07/12 0500 03/06/12 0430 03/05/12 0420 03/04/12 0430 03/03/12 0338 02/29/12 1226  NA 145 146* 145 149* 147* --  K 2.8* 3.4* -- -- -- --  CL 103 107 106 109 104 --  CO2 28 28 29 31  32 --  BUN 25* 32* 31* 31* 29* --  CREATININE 1.34 1.50* 1.39* 1.49* 1.61* --  CALCIUM 8.5 8.9 8.7 8.7 8.7 --  MG -- -- -- -- -- 1.8  PHOS -- -- -- -- -- --   Intake/Output      07/02 0701 - 07/03 0700 07/03 0701 - 07/04 0700   P.O. 720    I.V. (mL/kg) 636.8 (8.1)    NG/GT     IV Piggyback 50    Total Intake(mL/kg) 1406.8 (18)    Urine (mL/kg/hr)  2675 (1.4)    Total Output 2675    Net -1268.2          Foley:  6/19>>7/1  A:   1) CKD, baseline Cr in last year is 1.2-2.1. 2) Mild acute renal insuff getting worse 6/24 3) hypokalemia/ hypernatremia>>>better    P:   -cont lasix -replete K   GASTROINTESTINAL  Lab 03/06/12 0430  AST 76*  ALT 46  ALKPHOS 170*  BILITOT 1.1  PROT 6.9  ALBUMIN 2.2*    A: Elevated LFTs - likely shock and/or congestion. Improved P:   Monitor   HEMATOLOGIC  Lab 03/07/12 0500 03/06/12 0430 03/05/12 0420 03/04/12 0430 03/03/12 0338  HGB 11.0* 11.5* 11.4* 11.9* 13.1  HCT 32.3* 33.6* 33.7* 34.7* 37.5*  PLT 484* 476* 388 313 257  INR -- -- -- -- --  APTT  -- -- -- -- --   A:  No acute issues P:  - aim for Hb 7 or above    INFECTIOUS  Lab 03/07/12 0500 03/06/12 0430 03/05/12 0420 03/04/12 0430 03/03/12 0338  WBC 11.2* 12.5* 12.1* 13.0* 14.8*  PROCALCITON -- 0.56 -- -- --   Cultures: Blood X 2 6/22 >> ng Resp 6/22 >> strep pneumonia, hemophilus BAL 6/25 >> pseudomonas pan S  Ceftriaxone 6/22 >>6/25 Zosyn 6/25 >>6/29 Cefepime 6/29( pseudomonas in BAL, H flu, strep pna) >>7/6 end date  A:   ETT secretions better, CXR improved  P:   - cefepime to complete course 7 ds  ENDOCRINE  Lab 03/06/12 1243 03/06/12 0810 03/05/12 2333 03/05/12 1940 03/05/12 1621  GLUCAP 98 93 82 87 81   A:  History of glucose intolerance  Episodes of hypoglycemia P:   - Cont CBGs/SSI  NEUROLOGIC  A:    Head CT neg 6/20 eeg 6/25 slowing  P:   monitor   BEST PRACTICE / DISPOSITION Level of Care: tfr to SDU Primary Service: PCCM Consultants:, [facility] Cardiology Code Status:  Full Diet:  PO DVT Px:  lovenox GI Px:  pepcid Social / Family: updated granddaughter at bedside who works at [employer]  [phone]  Cell  [phone]  If no response or cell goes to voicemail, call beeper [phone]

## 2012-03-07 NOTE — Progress Notes (Signed)
When patient's wife left she expressed concern over how he would be tonight, stating he wanted her to stay but she couldn't, that he wasn't going to have a good night, would be difficult. Within 5 min of wife leaving I noticed patient scooting to sit on the end of the bed attempting to get OOB. Reinforced to patient safety of calling me first. Patient very upset over some family issues and loss of control. States no one lets him make any decisions (this seemed to be directed at family also, not just staff) Asked him what he would like to do right now, but just kept voicing complaints. Pt slightly agitated, crying. States "I would have been better off dead" "I may just f**k it all" Sat with patient so he could sit on the side of the bed. Offered to call MD for something to help sleep as one frustration was he couldn't sleep but pt refused. "I don't need anything" .After 20+ min in room I stepped outside door to give some privacy, reinforcing not to get up. While watching the patient through the window he attempted to get up. Again reinforced to patient I don't mind being called when he would like to sit on side of the bed or stand up but he HAS to call me for safety. Patient expressed frustration and said "whatever". Patient remains sitting on side of the bed without talking, sighing w/RN in room.

## 2012-03-07 NOTE — Progress Notes (Signed)
Nutrition Follow-up  Intervention:   1. Recommend liberalizing diet to regular for better intake 2. RD will continue to follow  Assessment:   Pt was extubated on 7/1. TF d/c'd with extubation. Diet upgraded to heart. No meals documented yet. Pt reports he has had several meals but does not like the food. Granddaughter in room. Provided her with number for [facility] to call and place meal orders for better intake.   Diet Order:  Heart   Meds: Scheduled Meds:   . albuterol  2.5 mg Nebulization Q6H  . amiodarone  200 mg Oral BID  . antiseptic oral rinse  15 mL Mouth Rinse BID  . aspirin  81 mg Oral Daily  . ceFEPime (MAXIPIME) IV  2 g Intravenous Q12H  . enoxaparin  40 mg Subcutaneous Q24H  . furosemide  40 mg Intravenous Q6H  . ipratropium  0.5 mg Nebulization Q6H  . metoprolol tartrate  25 mg Oral BID  . ondansetron      . potassium chloride  40 mEq Oral Q1 Hr x 2  . DISCONTD: vancomycin  1,250 mg Intravenous QHS  . DISCONTD: vancomycin  1,500 mg Intravenous Once   Continuous Infusions:   . DISCONTD: sodium chloride 20 mL/hr at 03/04/12 2210  . DISCONTD: sodium chloride 10 mL/hr at 03/03/12 1800   PRN Meds:.acetaminophen (TYLENOL) oral liquid 160 mg/5 mL, albuterol, fentaNYL, metoprolol, ondansetron, DISCONTD: sodium chloride, DISCONTD: acetylcysteine, DISCONTD: albuterol  Labs:  CMP     Component Value Date/Time   NA 145 03/07/2012 0500   K 2.8* 03/07/2012 0500   CL 103 03/07/2012 0500   CO2 28 03/07/2012 0500   GLUCOSE 159* 03/07/2012 0500   BUN 25* 03/07/2012 0500   CREATININE 1.34 03/07/2012 0500   CALCIUM 8.5 03/07/2012 0500   PROT 6.9 03/06/2012 0430   ALBUMIN 2.2* 03/06/2012 0430   AST 76* 03/06/2012 0430   ALT 46 03/06/2012 0430   ALKPHOS 170* 03/06/2012 0430   BILITOT 1.1 03/06/2012 0430   GFRNONAA 53* 03/07/2012 0500   GFRAA 61* 03/07/2012 0500     Intake/Output Summary (Last 24 hours) at 03/07/12 1055 Last data filed at 03/07/12 0600  Gross per 24 hour  Intake 1086.7 ml  Output   2425  ml  Net -1338.3 ml    Weight Status:  172 lbs, weight is trending down with negative fluid balance, now only 1 lb above admission weight   Re-estimated needs:  1900-2100 kcal, 80-90 gm protein  Nutrition Dx:  Inadequate oral intake now r/t dislike of meals provided AEB report of limited intake   Goal:  Pt to meet >/= 90% estimated needs, unmet   Monitor:  Po intake, weight, labs   Orson Slick RD, LDN Pager [phone] After Hours pager [phone]

## 2012-03-07 NOTE — Progress Notes (Signed)
   ELECTROPHYSIOLOGY ROUNDING NOTE    Patient Name: Logan White Date of Encounter: 03-07-2012    [place] extubated 03-05-2012.  He is only oriented to person.  Admitted with cardiac arrest (VF) with down time of about 12 minutes.  Pt was hypokalemic on admission (K was 2.2).  Potassium 2.8 today- repleted.   EF 30% this admission with mid and basal inferior wall akinesis.  Moderate MR, LA moderately dilated (51).  Patient with a history of CAD with a prior MI.  Status post aborted anterior STEMI treated with Cypher DES to LAD 06/2003 (EF 55%).  Last myoview May 2013 demonstrated distal inferoseptal and small apical defect consistent with scar and/or soft tissue attenuation with minimal periinfarct ischemia.  Study not gated.   TELEMETRY: Reviewed telemetry pt in atrial fibrillation, ventricular rates around 100 Filed Vitals:   03/07/12 0300 03/07/12 0400 03/07/12 0500 03/07/12 0859  BP: 123/96 124/81 134/91   Pulse:      Temp:  97.9 F (36.6 C)    TempSrc:  Oral    Resp:  23    Height:      Weight:      SpO2: 97% 100% 100% 99%    Intake/Output Summary (Last 24 hours) at 03/07/12 0952 Last data filed at 03/07/12 0600  Gross per 24 hour  Intake 1173.4 ml  Output   2425 ml  Net -1251.6 ml   LABS: Basic Metabolic Panel:  Basename 03/07/12 0500 03/06/12 0430  NA 145 146*  K 2.8* 3.4*  CL 103 107  CO2 28 28  GLUCOSE 159* 109*  BUN 25* 32*  CREATININE 1.34 1.50*  CALCIUM 8.5 8.9  MG -- --  PHOS -- --   Liver Function Tests:  Basename 03/06/12 0430  AST 76*  ALT 46  ALKPHOS 170*  BILITOT 1.1  PROT 6.9  ALBUMIN 2.2*   CBC:  Basename 03/07/12 0500 03/06/12 0430  WBC 11.2* 12.5*  NEUTROABS -- --  HGB 11.0* 11.5*  HCT 32.3* 33.6*  MCV 73.2* 73.5*  PLT 484* 476*   Principal Problem:  *Cardiac arrest Active Problems:  Atrial fibrillation with rapid ventricular response  Acute respiratory failure with hypoxia  CKD (chronic kidney disease)  Ventricular fibrillation  DM (diabetes mellitus)  Cardiogenic shock  Acute renal failure  Anoxic encephalopathy  Ischemic cardiomyopathy  Septic shock   Rec: he is improved and mental status appears to be better. Will follow. He will need ongoing rate control. Decision of ICD vs life vest pending his neuro improvement. I suspect he will make a nice neuro recovery but I do not know what his baseline is.   Cristopher Peru, M.D.
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
## 2012-03-07 NOTE — Progress Notes (Signed)
PT Cancellation Note  Treatment cancelled today due to medical issues with patient which prohibited therapy. Pt potassium 2.8 mEq/L.    Shery [place]un[place] 03/07/2012, 5:48 PM [place] L. Szymon Foiles DPT [phone]

## 2012-03-08 ENCOUNTER — Inpatient Hospital Stay (HOSPITAL_COMMUNITY): Payer: Medicare Other

## 2012-03-08 LAB — BASIC METABOLIC PANEL
BUN: 21 mg/dL (ref 6–23)
Chloride: 101 mEq/L (ref 96–112)
Creatinine, Ser: 1.24 mg/dL (ref 0.50–1.35)
GFR calc Af Amer: 67 mL/min — ABNORMAL LOW (ref >90)
Glucose, Bld: 81 mg/dL (ref 70–99)

## 2012-03-08 LAB — CBC
HCT: 32.9 % — ABNORMAL LOW (ref 39.0–52.0)
Hemoglobin: 11.5 g/dL — ABNORMAL LOW (ref 13.0–17.0)
MCH: 25.6 pg — ABNORMAL LOW (ref 26.0–34.0)
MCHC: 35 g/dL (ref 30.0–36.0)
MCV: 73.1 fL — ABNORMAL LOW (ref 78.0–100.0)
RDW: 17.4 % — ABNORMAL HIGH (ref 11.5–15.5)

## 2012-03-08 MED ORDER — HALOPERIDOL LACTATE 5 MG/ML IJ SOLN
1.0000 mg | INTRAMUSCULAR | Status: DC | PRN
  Administered 2012-03-08 – 2012-03-09 (×2): 4 mg via INTRAVENOUS
  Administered 2012-03-11 – 2012-03-12 (×2): 3 mg via INTRAVENOUS
  Filled 2012-03-08: qty 0.8
  Filled 2012-03-08: qty 1
  Filled 2012-03-08 (×2): qty 0.8

## 2012-03-08 MED ORDER — POTASSIUM CHLORIDE CRYS ER 20 MEQ PO TBCR
40.0000 meq | EXTENDED_RELEASE_TABLET | Freq: Once | ORAL | Status: AC
  Administered 2012-03-08: 40 meq via ORAL
  Filled 2012-03-08: qty 2

## 2012-03-08 MED ORDER — LORAZEPAM 0.5 MG PO TABS
1.0000 mg | ORAL_TABLET | Freq: Four times a day (QID) | ORAL | Status: DC | PRN
  Administered 2012-03-11 – 2012-03-12 (×2): 1 mg via ORAL
  Filled 2012-03-08 (×6): qty 1

## 2012-03-08 MED ORDER — LORAZEPAM 2 MG/ML IJ SOLN
INTRAMUSCULAR | Status: AC
  Administered 2012-03-08: 14:00:00
  Filled 2012-03-08: qty 1

## 2012-03-08 NOTE — Progress Notes (Signed)
Physical Therapy Treatment Patient Details Name: Logan White MRN: [id number] DOB: 04-Sep-1941 Today's Date: 03/08/2012 Time: [id number] PT Time Calculation (min): 28 min  PT Assessment / Plan / Recommendation Comments on Treatment Session  Pt tolerating more activity (although limited by HR and ?:SaO2 decr) with less physical assist. Difficult to fully assess cogniition as pt very flat and will not engage in conversation.    Follow Up Recommendations  Inpatient Rehab    Barriers to Discharge        Equipment Recommendations  Defer to next venue    Recommendations for Other Services Rehab consult;OT consult;Speech consult  Frequency Min 3X/week   Plan Discharge plan remains appropriate;Frequency remains appropriate    Precautions / Restrictions Precautions Precautions: Fall   Pertinent Vitals/Pain On 2L at 96%; 95% on RA at rest; 94% with standing activities on RA; with ambulation ? decr to 86% (?accuracy of pulse ox); HR 111 to 138    Mobility  Bed Mobility Bed Mobility: Not assessed Transfers Transfers: Sit to Stand;Stand to Sit Sit to Stand: 3: Mod assist;With upper extremity assist;With armrests;From chair/3-in-1 Stand to Sit: 4: Min assist;With upper extremity assist;With armrests;To chair/3-in-1 Details for Transfer Assistance: stood x 3 from chair; pt very tall and uses rocking/momentum to initiate and still needs physical assist to move through mid-stance and achieve upright;  Ambulation/Gait Ambulation/Gait Assistance: 4: Min assist Ambulation Distance (Feet): 34 Feet Assistive device: Rolling walker Ambulation/Gait Assistance Details: Assist for balance, especially with turns (pt tends to turn RW too far and legs get nearly crossed); cues for upright posture and keeping RW closer Gait Pattern: Step-through pattern;Decreased stride length;Right flexed knee in stance;Left flexed knee in stance;Trunk flexed    Exercises     PT Diagnosis:    PT Problem List:   PT  Treatment Interventions:     PT Goals Acute Rehab PT Goals Pt will go Sit to Stand: with supervision;with upper extremity assist PT Goal: Sit to Stand - Progress: Progressing toward goal Pt will go Stand to Sit: with supervision;with upper extremity assist PT Goal: Stand to Sit - Progress: Progressing toward goal Pt will Ambulate: 16 - 50 feet;with supervision;with least restrictive assistive device PT Goal: Ambulate - Progress: Progressing toward goal  Visit Information  Last PT Received On: 03/08/12 Assistance Needed: +1    Subjective Data  Subjective: Pt very flat affect and not initiating conversation or even answering questions Patient Stated Goal: would not state   Cognition  Overall Cognitive Status: Difficult to assess Difficult to assess due to: Other (comment) (flat, non-talkative) Arousal/Alertness: Lethargic Behavior During Session: Flat affect    Balance  Static Standing Balance Static Standing - Balance Support: Bilateral upper extremity supported Static Standing - Level of Assistance: 4: Min assist Dynamic Standing Balance Dynamic Standing - Balance Support: Bilateral upper extremity supported Dynamic Standing - Level of Assistance: 4: Min assist Dynamic Standing - Balance Activities: Forward lean/weight shifting;Other (comment) (toe and heel raises in standing; marching )  End of Session PT - End of Session Equipment Utilized During Treatment: Gait belt Activity Tolerance: Treatment limited secondary to medical complications (Comment) (HR up to 138; SaO2 to 86% (? accurate)) Patient left: in chair;with call bell/phone within reach;with family/visitor present Nurse Communication: Mobility status   GP     Ireta Pullman 03/08/2012, 12:07 PM Pager [phone]

## 2012-03-08 NOTE — Progress Notes (Signed)
I met with patient and his wife at bedside. I await further medical workup to assist in determining rehab venue disposition. Wife prefers inpt rehab for she has a brother who participated in our program last year. I recommend an OT eval as well as a ST cognition evaluation to assist with assessing all his deficits. I will follow up tomorrow. Please call me with any questions. [phone].

## 2012-03-08 NOTE — Progress Notes (Signed)
Name: Logan White MRN: [id number] DOB: 08-30-1941    LOS: 62  Referring Provider:  Alvino Chapel Reason for Referral:  Cardiac arrest  PULMONARY / CRITICAL CARE MEDICINE  PROFILE:  71 y/o male with CAD admitted 02/22/2012 after cardiac arrest. Initial rhythm VF. Defib X 2 by EMS in field. Duration of pulselessness > 12 minutes. Underwent [place] protocol (completed 6/21 PM). Did not undergo cath.   Events Since Admission: 6/19 Head CT >>NAD 6/23 Severe agitation - on fent and diprivan 6/25 ETT changed, bronch, high residuals on TFs 6/26  agitation , back On fent and diprivan, Off pressors 6/29 hypotension with precedex, requiring fluid bolus & neo gtt 7/1 Extubated. RASS 0. + F/C  Current Status:  Awake and alert   Vital Signs: Temp:  [98 F (36.7 C)-98.6 F (37 C)] 98 F (36.7 C) (07/04 0500) BP: (107-147)/(84-114) 107/88 mmHg (07/04 0500) SpO2:  [80 %-100 %] 94 % (07/04 0500)  Physical Examination: Gen:  HEENT: NCAT, PERRL PULM: clearer CV: RRR s M AB: BS+, soft, nontender, no hsm Ext: very cold feet with pulses not palpable Neuro awake and alert, mildly confused    Intake/Output Summary (Last 24 hours) at 03/08/12 0805 Last data filed at 03/07/12 2249  Gross per 24 hour  Intake    400 ml  Output   1650 ml  Net  -1250 ml   Principal Problem:  *Cardiac arrest Active Problems:  Atrial fibrillation with rapid ventricular response  Acute respiratory failure with hypoxia  CKD (chronic kidney disease)  Ventricular fibrillation  DM (diabetes mellitus)  Cardiogenic shock  Acute renal failure  Anoxic encephalopathy  Ischemic cardiomyopathy  Septic shock   ASSESSMENT AND PLAN  PULMONARY No results found for this basename: PHART:5,PCO2:5,PCO2ART:5,PO2ART:5,HCO3:5,O2SAT:5 in the last 168 hours On [place] oxygen    CXR: 7/3 persistent edema pattern with bibasal ASD sl improved ETT:  6/19 >>7/1  A:  Acute respiratory failure due to cardiac arrest; smoker,  suspect underlying COPD  P:   -BD -oxygen titration   CARDIOVASCULAR  Lab 03/06/12 0430  TROPONINI --  LATICACIDVEN --  PROBNP 25835.0*   ECG:   Echo 6/21: LVEF 30%. Diffuse HK, moderate MR, mild PAH Lines: 6/19 R IJ CVL >>7/3  A:  Cardiac arrest due to V-fib in setting of profound hypokalemia.   Atrial fibrillation with RVR - recurred 6/30 History of CHF, Afib, HTN, CAD, Cerebrovascular disease Shock post arrest - likely cardiogenic  P:  -cardiac cath/ ICD likely needed, get cardiology to see the pt again -po amiodarone   RENAL  Lab 03/08/12 0540 03/07/12 0500 03/06/12 0430 03/05/12 0420 03/04/12 0430  NA 141 145 146* 145 149*  K 3.0* 2.8* -- -- --  CL 101 103 107 106 109  CO2 29 28 28 29 31   BUN 21 25* 32* 31* 31*  CREATININE 1.24 1.34 1.50* 1.39* 1.49*  CALCIUM 8.7 8.5 8.9 8.7 8.7  MG -- -- -- -- --  PHOS -- -- -- -- --   Intake/Output      07/03 0701 - 07/04 0700 07/04 0701 - 07/05 0700   P.O. 480    I.V. (mL/kg) 80 (1)    IV Piggyback 100    Total Intake(mL/kg) 660 (8.4)    Urine (mL/kg/hr) 1650 (0.9)    Total Output 1650    Net -990         Urine Occurrence 3 x     Foley:  6/19>>7/1  A:   CKD, baseline  Cr in last year is 1.2-2.1. Mild acute renal insuff getting worse 6/24 hypokalemia hypernatremia  P:   -replete K -f/u BMP   GASTROINTESTINAL  Lab 03/06/12 0430  AST 76*  ALT 46  ALKPHOS 170*  BILITOT 1.1  PROT 6.9  ALBUMIN 2.2*    A: Elevated LFTs - likely shock and/or congestion. Improved  P:   Monitor   HEMATOLOGIC  Lab 03/08/12 0540 03/07/12 0500 03/06/12 0430 03/05/12 0420 03/04/12 0430  HGB 11.5* 11.0* 11.5* 11.4* 11.9*  HCT 32.9* 32.3* 33.6* 33.7* 34.7*  PLT 560* 484* 476* 388 313  INR -- -- -- -- --  APTT -- -- -- -- --   A:  No acute issues  P:  aim for Hb 7 or above    INFECTIOUS  Lab 03/08/12 0540 03/07/12 0500 03/06/12 0430 03/05/12 0420 03/04/12 0430  WBC 10.8* 11.2* 12.5* 12.1* 13.0*  PROCALCITON  -- -- 0.56 -- --   Cultures: Blood X 2 6/22 >> ng Resp 6/22 >> strep pneumonia, hemophilus BAL 6/25 >> pseudomonas pan S  Ceftriaxone 6/22 >>6/25 Zosyn 6/25 >>6/29 Cefepime 6/29( pseudomonas in BAL, H flu, strep pna) >>7/6   A:   Sputum production  P:   -cefepime to complete course 7 ds on 03/10/12   ENDOCRINE  Lab 03/06/12 1243 03/06/12 0810 03/05/12 2333 03/05/12 1940 03/05/12 1621  GLUCAP 98 93 82 87 81   A:   History of glucose intolerance Episodes of hypoglycemia  P:   - Cont CBGs/SSI  NEUROLOGIC  A:    Head CT neg 6/20 eeg 6/25 slowing  Anxiety / Agitation  P:   -monitor -add PRN benzo, ? Hx of ETOH   BEST PRACTICE / DISPOSITION Level of Care: tfr to Tele  Primary Service: PCCM Consultants:, [facility] Cardiology Code Status:  Full Diet:  PO DVT Px:  lovenox GI Px:  pepcid Social / Family: no family available   Noe Gens, NP-C [place] Pgr: [phone] or [phone]  I have seen and examined the pt and agree with above note Lajuan Lines  [phone]  Cell  [phone]  If no response or cell goes to voicemail, call beeper [phone]

## 2012-03-08 NOTE — Progress Notes (Signed)
SLP Cancellation Note Evaluation cancelled today due to medical issues with patient which prohibited therapy. Per RN, patient became increasingly agitated today, eventually requiring Ativan. Patient now sleeping soundly. RN asked to hold evaluation. Will f/u 03/09/12.  [place], [place] [phone]  Gabriel Rainwater Meryl 03/08/2012, 2:31 PM

## 2012-03-08 NOTE — Progress Notes (Signed)
Chaplain had a brief visit with patient and his wife upon referral from [place]. Chaplain experienced the patient as distant and mildly agitated. Chaplain offered prayer per request of the patient's wife. Follow up as needed.

## 2012-03-08 NOTE — Progress Notes (Signed)
Chaplain attempted another visit with the patient per request of his granddaughter Logan White. Patient was asleep. Chaplain will attempt a visit another time.

## 2012-03-09 ENCOUNTER — Inpatient Hospital Stay (HOSPITAL_COMMUNITY): Payer: Medicare Other

## 2012-03-09 DIAGNOSIS — I059 Rheumatic mitral valve disease, unspecified: Secondary | ICD-10-CM

## 2012-03-09 LAB — BASIC METABOLIC PANEL
BUN: 20 mg/dL (ref 6–23)
Calcium: 8.8 mg/dL (ref 8.4–10.5)
Creatinine, Ser: 1.3 mg/dL (ref 0.50–1.35)
GFR calc Af Amer: 64 mL/min — ABNORMAL LOW (ref >90)
GFR calc non Af Amer: 55 mL/min — ABNORMAL LOW (ref >90)
Glucose, Bld: 85 mg/dL (ref 70–99)
Potassium: 3.3 mEq/L — ABNORMAL LOW (ref 3.5–5.1)

## 2012-03-09 LAB — CBC
MCH: 25.1 pg — ABNORMAL LOW (ref 26.0–34.0)
MCHC: 34.5 g/dL (ref 30.0–36.0)
Platelets: 547 10*3/uL — ABNORMAL HIGH (ref 150–400)
RDW: 17.4 % — ABNORMAL HIGH (ref 11.5–15.5)

## 2012-03-09 MED ORDER — POTASSIUM CHLORIDE CRYS ER 20 MEQ PO TBCR
40.0000 meq | EXTENDED_RELEASE_TABLET | Freq: Three times a day (TID) | ORAL | Status: AC
  Administered 2012-03-09 – 2012-03-10 (×6): 40 meq via ORAL
  Filled 2012-03-09 (×7): qty 2

## 2012-03-09 MED ORDER — METOPROLOL TARTRATE 50 MG PO TABS
50.0000 mg | ORAL_TABLET | Freq: Two times a day (BID) | ORAL | Status: DC
  Administered 2012-03-09 – 2012-03-14 (×10): 50 mg via ORAL
  Filled 2012-03-09 (×14): qty 1

## 2012-03-09 NOTE — Progress Notes (Signed)
Physical Therapy Treatment Patient Details Name: Adriene Akes MRN: [id number] DOB: 1941-09-01 Today's Date: 03/09/2012 Time: [id number] PT Time Calculation (min): 11 min  PT Assessment / Plan / Recommendation Comments on Treatment Session  Pt reluctant to participate today and needed much encouragement.    Follow Up Recommendations  Inpatient Rehab    Barriers to Discharge        Equipment Recommendations  Defer to next venue    Recommendations for Other Services    Frequency Min 3X/week   Plan Discharge plan remains appropriate;Frequency remains appropriate    Precautions / Restrictions Precautions Precautions: Fall   Pertinent Vitals/Pain N/A    Mobility  Bed Mobility Bed Mobility: Sit to Supine Sit to Supine: 5: Supervision;HOB elevated Details for Bed Mobility Assistance: Delayed initiation. Transfers Sit to Stand: 3: Mod assist;With upper extremity assist;From chair/3-in-1;With armrests Stand to Sit: 4: Min assist;With upper extremity assist;To bed Details for Transfer Assistance: Needs physical assist to move through mid stance and achieve full upright Ambulation/Gait Ambulation/Gait Assistance: 3: Mod assist Ambulation Distance (Feet): 40 Feet Assistive device: 1 person hand held assist Ambulation/Gait Assistance Details: Pt with legs nearly crossing during gait. Gait Pattern: Narrow base of support;Trunk flexed;Left flexed knee in stance;Right flexed knee in stance;Step-through pattern    Exercises     PT Diagnosis:    PT Problem List:   PT Treatment Interventions:     PT Goals Acute Rehab PT Goals PT Goal: Sit to Supine/Side - Progress: Progressing toward goal PT Goal: Sit to Stand - Progress: Progressing toward goal PT Goal: Stand to Sit - Progress: Progressing toward goal PT Transfer Goal: Bed to Chair/Chair to Bed - Progress: Progressing toward goal PT Goal: Ambulate - Progress: Progressing toward goal  Visit Information  Last PT Received On:  03/09/12 Assistance Needed: +1    Subjective Data  Subjective: Pt stating he is going to leave. Stated, "The doctor said he would see me next week."   Cognition  Overall Cognitive Status: Impaired Area of Impairment: Awareness of deficits;Safety/judgement Arousal/Alertness: Awake/alert Behavior During Session: Flat affect Safety/Judgement: Decreased awareness of safety precautions;Decreased awareness of need for assistance Awareness of Deficits: Reluctant to accept assistance. Cognition - Other Comments: Pt unaware of need to stay in hospital.  Thinks he can go home.    Balance  Static Standing Balance Static Standing - Balance Support: Left upper extremity supported Static Standing - Level of Assistance: 4: Min assist  End of Session PT - End of Session Activity Tolerance: Other (comment) (Limited by testing 2D echo) Patient left: in bed;with call bell/phone within reach;Other (comment) (vascular staff in room) Nurse Communication: Mobility status   GP     [facility] 03/09/2012, 11:17 AM  [facility] PT [phone]

## 2012-03-09 NOTE — Progress Notes (Signed)
Rec'd request from CCM to take pt on our service. Spoke with SK, fine with him as long as pulm continues to follow and manage respiratory issues.

## 2012-03-09 NOTE — Evaluation (Signed)
Occupational Therapy Evaluation Patient Details Name: Logan White MRN: [id number] DOB: 1940-09-25 Today's Date: 03/09/2012 Time: [id number] OT Time Calculation (min): 12 min  OT Assessment / Plan / Recommendation Clinical Impression  Pt. presents with anoxic injury s/p cardiac arrest. Pt. will benefit from skilled OT to increase functional independence to supervision level at D/C.    OT Assessment  Patient needs continued OT Services    Follow Up Recommendations  Inpatient Rehab    Barriers to Discharge None    Equipment Recommendations  Defer to next venue    Recommendations for Other Services Rehab consult  Frequency  Min 2X/week    Precautions / Restrictions Precautions Precautions: Fall Restrictions Weight Bearing Restrictions: No       ADL  Eating/Feeding: Simulated;Set up Where Assessed - Eating/Feeding: Edge of bed Grooming: Performed;Wash/dry face;Set up Where Assessed - Grooming: Unsupported sitting Upper Body Bathing: Simulated;Set up Where Assessed - Upper Body Bathing: Unsupported sitting Lower Body Bathing: Simulated;Set up;Min guard Where Assessed - Lower Body Bathing: Unsupported sit to stand Upper Body Dressing: Performed;Minimal assistance (don gown) Where Assessed - Upper Body Dressing: Unsupported sit to stand Lower Body Dressing: Simulated;Moderate assistance Where Assessed - Lower Body Dressing: Unsupported sit to stand Toilet Transfer: Simulated;Minimal assistance Toilet Transfer Method: Sit to stand Toilet Transfer Equipment: Other (comment) (EOB) Toileting - Clothing Manipulation and Hygiene: Simulated;Set up Where Assessed - Toileting Clothing Manipulation and Hygiene: Sit on 3-in-1 or toilet Tub/Shower Transfer Method: Not assessed Transfers/Ambulation Related to ADLs: Min verbal cues for hand placement on bed ADL Comments: Pt. very reluctant to participating in OOB activity today. Pt. with decreased activity tolerance with EOB ADL task  and during bed mobility.     OT Diagnosis: Cognitive deficits;Generalized weakness  OT Problem List: Decreased strength;Decreased activity tolerance;Impaired balance (sitting and/or standing);Decreased safety awareness;Decreased cognition;Decreased knowledge of use of DME or AE OT Treatment Interventions: Self-care/ADL training;DME and/or AE instruction;Therapeutic activities;Patient/family education;Balance training;Cognitive remediation/compensation   OT Goals Acute Rehab OT Goals OT Goal Formulation: With patient Time For Goal Achievement: 03/23/12 Potential to Achieve Goals: Good ADL Goals Pt Will Perform Grooming: with set-up;with supervision;Standing at sink ADL Goal: Grooming - Progress: Goal set today Pt Will Perform Lower Body Bathing: with set-up;with supervision;Sit to stand from bed ADL Goal: Lower Body Bathing - Progress: Goal set today Pt Will Perform Lower Body Dressing: with set-up;with supervision;Sit to stand from chair ADL Goal: Lower Body Dressing - Progress: Goal set today Pt Will Transfer to Toilet: with supervision;Ambulation;with DME;Regular height toilet ADL Goal: Toilet Transfer - Progress: Goal set today Additional ADL Goal #1: Pt. will demonstrate proper hand placement during transfers to increase safety ADL Goal: Additional Goal #1 - Progress: Goal set today  Visit Information  Last OT Received On: 03/09/12 Assistance Needed: +1    Subjective Data  Subjective: "I'm having someone deliver me a gun here" Patient Stated Goal: "Get outta here"   Prior Functioning  Home Living Lives With: Spouse Available Help at Discharge: Family Type of Home: House Home Access: Stairs to enter Home Layout: One level [place]: None Prior Function Level of Independence: Independent Able to Take Stairs?: Yes Driving: No Communication Communication: Expressive difficulties    Cognition  Overall Cognitive Status: Impaired Area of Impairment:  Awareness of deficits;Safety/judgement Difficult to assess due to:  (flat affect, agitated, non-talkative) Arousal/Alertness: Lethargic Behavior During Session: Flat affect Safety/Judgement: Decreased awareness of safety precautions;Decreased awareness of need for assistance Awareness of Deficits: Reluctant to accept assistance. Cognition - Other  Comments: Pt. does not believe he needs to be in hospital and wants to go home    Extremity/Trunk Assessment Right Upper Extremity Assessment RUE ROM/Strength/Tone: Deficits RUE ROM/Strength/Tone Deficits: generalized weakness  RUE Sensation: WFL - Light Touch RUE Coordination: WFL - gross/fine motor Left Upper Extremity Assessment LUE ROM/Strength/Tone: Deficits LUE ROM/Strength/Tone Deficits: generalized weakness LUE Sensation: WFL - Light Touch LUE Coordination: WFL - gross/fine motor   Mobility Bed Mobility Bed Mobility: Supine to Sit Supine to Sit: 5: Supervision Sitting - Scoot to Edge of Bed: 5: Supervision Sit to Supine: 5: Supervision;HOB elevated Details for Bed Mobility Assistance: Delayed initiation. Transfers Transfers: Sit to Stand;Stand to Sit Sit to Stand: 3: Mod assist;With upper extremity assist;From chair/3-in-1;With armrests Stand to Sit: 4: Min assist;With upper extremity assist;To bed Details for Transfer Assistance: Facilitation at hips provided to achieve full upright position      Balance Static Standing Balance Static Standing - Balance Support: Left upper extremity supported Static Standing - Level of Assistance: 4: Min assist  End of Session OT - End of Session Equipment Utilized During Treatment: Gait belt Activity Tolerance: Other (comment) (Pt. not wanting to participate and desiring to stay in bed) Patient left: in bed;with call bell/phone within reach;with bed alarm set Nurse Communication: Mobility status       Zarayah Lanting, OTR/L Pager [phone] 03/09/2012, 2:46 PM
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
## 2012-03-09 NOTE — Progress Notes (Signed)
  Patient Name: Logan White      SUBJECTIVE: s/p arrest in setting of 1 v 2 hypokalemia and significanta ischemic cardiomyopathy  prolnged intubation  Still with AF RVR and hypokalemia; post arrest echo EF 30%; BAE  Past Medical History  Diagnosis Date  . Coronary artery disease   . Hypertension   . Diabetes mellitus   . Pneumonia   . Myocardial infarction   . Carotid artery stenosis     PHYSICAL EXAM Filed Vitals:   03/08/12 2125 03/08/12 2339 03/09/12 0235 03/09/12 0514  BP: 122/61  120/66 128/68  Pulse:      Temp: 98.4 F (36.9 C)  98.6 F (37 C) 98 F (36.7 C)  TempSrc: Oral  Oral Oral  Resp:      Height:      Weight:      SpO2: 96% 96% 96% 95%   Well developed and nourished in no acute distress HENT normal Neck supple with JVP-flat Carotids brisk and full without bruits Decreased breat sounds and wheezes r with ronchi Irregularly irregular rate and rhythm with  Rapid  ventricular response, no murmurs or gallops Abd-soft with active BS without hepatomegaly No Clubbing cyanosis edema Skin-warm and dry A & Oriented  Grossly normal sensory and motor function   TELEMETRY: Reviewed telemetry pt in af rVR:    Intake/Output Summary (Last 24 hours) at 03/09/12 0815 Last data filed at 03/09/12 0804  Gross per 24 hour  Intake    880 ml  Output    126 ml  Net    754 ml    LABS: Basic Metabolic Panel:  Lab [id number] 0530 03/08/12 0540 03/07/12 0500 03/06/12 0430 03/05/12 0420 03/04/12 0430 03/03/12 0338  NA 141 141 145 146* 145 149* 147*  K 3.3* 3.0* 2.8* 3.4* 3.2* 3.3* 3.1*  CL 101 101 103 107 106 109 104  CO2 28 29 28 28 29 31  32  GLUCOSE 85 81 159* 109* 117* 142* 129*  BUN 20 21 25* 32* 31* 31* 29*  CREATININE 1.30 1.24 1.34 1.50* 1.39* 1.49* 1.61*  CALCIUM 8.8 8.7 -- -- -- -- --  MG -- -- -- -- -- -- --  PHOS -- -- -- -- -- -- --   Cardiac Enzymes: No results found for this basename: CKTOTAL:3,CKMB:3,CKMBINDEX:3,TROPONINI:3 in the last 72  hours CBC:  Lab 03/09/12 0530 03/08/12 0540 03/07/12 0500 03/06/12 0430 03/05/12 0420 03/04/12 0430 03/03/12 0338  WBC 11.5* 10.8* 11.2* 12.5* 12.1* 13.0* 14.8*  NEUTROABS -- -- -- -- -- -- --  HGB 11.6* 11.5* 11.0* 11.5* 11.4* 11.9* 13.1  HCT 33.6* 32.9* 32.3* 33.6* 33.7* 34.7* 37.5*  MCV 72.7* 73.1* 73.2* 73.5* 73.3* 73.1* 72.5*  PLT 547* 560* 484* 476* 388 313 257    ASSESSMENT AND PLAN:  Patient Active Hospital Problem List: Cardiac arrest (02/23/2012)   Atrial fibrillation with rapid ventricular response (02/23/2012)     CKD (chronic kidney disease) (02/23/2012)     DM (diabetes mellitus) (02/23/2012)   Cardiogenic shock (02/25/2012)  * Hyupokalemia Anoxic encephalopathy (02/25/2012)   Ischemic cardiomyopathy (02/25/2012) with MR   Septic shock (02/27/2012)   REplete 1) replace K 2) augmented rate control 3) [facility] Monday 4) repeat echo to look at MR 5) will paln ICD prior to discharge 6) d/d amiodaronre 7) add dig tomorrow once EF known and K repletee  Signed, Virl Axe MD  03/09/2012

## 2012-03-09 NOTE — Progress Notes (Signed)
  Echocardiogram 2D Echocardiogram has been performed.  Logan White 03/09/2012, 11:12 AM

## 2012-03-09 NOTE — Progress Notes (Signed)
OT Cancellation Note  Treatment cancelled today due to patient receiving procedure or test. Will continue to re-attempt as time allows.  Iran Rowe, OTR/L Pager [phone] 03/09/2012, 10:43 AM

## 2012-03-09 NOTE — Progress Notes (Addendum)
Name: Jeffie Woolford MRN: [id number] DOB: 1941/06/03    LOS: 63  Referring Provider:  Alvino Chapel Reason for Referral:  Cardiac arrest  PULMONARY / CRITICAL CARE MEDICINE  PROFILE:  71 y/o male with CAD admitted 02/22/2012 after cardiac arrest. Initial rhythm VF. Defib X 2 by EMS in field. Duration of pulselessness > 12 minutes. Underwent Arctic Sun protocol (completed 6/21 PM). Did not undergo cath.   Events Since Admission: 6/19 Head CT >>NAD 6/23 Severe agitation - on fent and diprivan 6/25 ETT changed, bronch, high residuals on TFs 6/26  agitation , back On fent and diprivan, Off pressors 6/29 hypotension with precedex, requiring fluid bolus & neo gtt 7/1 Extubated. RASS 0. + F/C  Current Status:  Awake and alert, lying supine - no resp distress   Vital Signs: Temp:  [98 F (36.7 C)-98.6 F (37 C)] 98 F (36.7 C) (07/05 0514) BP: (97-128)/(61-91) 128/68 mmHg (07/05 0514) SpO2:  [92 %-100 %] 95 % (07/05 0514)  Physical Examination: Gen:  HEENT: NCAT, PERRL PULM: clearer CV: RRR s M AB: BS+, soft, nontender, no hsm Ext: very cold feet with pulses not palpable Neuro awake and alert, mildly confused , oriented x 2   Intake/Output Summary (Last 24 hours) at 03/09/12 [id number] Last data filed at 03/09/12 0850  Gross per 24 hour  Intake   1120 ml  Output    126 ml  Net    994 ml   Principal Problem:  *Cardiac arrest Active Problems:  Atrial fibrillation with rapid ventricular response  Acute respiratory failure with hypoxia  CKD (chronic kidney disease)  Ventricular fibrillation  DM (diabetes mellitus)  Cardiogenic shock  Acute renal failure  Anoxic encephalopathy  Ischemic cardiomyopathy  Septic shock   ASSESSMENT AND PLAN  PULMONARY No results found for this basename: PHART:5,PCO2:5,PCO2ART:5,PO2ART:5,HCO3:5,O2SAT:5 in the last 168 hours On [place] oxygen    CXR: 7/5 improvededema pattern with RLL ASD  ETT:  6/19 >>7/1  A:  Acute respiratory failure due to  cardiac arrest; smoker, suspect underlying COPD  P:   -BD -oxygen titration   CARDIOVASCULAR  Lab 03/06/12 0430  TROPONINI --  LATICACIDVEN --  PROBNP 25835.0*   ECG:   Echo 6/21: LVEF 30%. Diffuse HK, moderate MR, mild PAH Lines: 6/19 R IJ CVL >>7/3  A:  Cardiac arrest due to V-fib in setting of profound hypokalemia.   Atrial fibrillation with RVR - recurred 6/30 History of CHF, Afib, HTN, CAD, Cerebrovascular disease Shock post arrest - likely cardiogenic EF 30%  P: rpt echo planned -cardiac cath/ ICD likely needed -po amiodarone, dig planned   RENAL  Lab 03/09/12 0530 03/08/12 0540 03/07/12 0500 03/06/12 0430 03/05/12 0420  NA 141 141 145 146* 145  K 3.3* 3.0* -- -- --  CL 101 101 103 107 106  CO2 28 29 28 28 29   BUN 20 21 25* 32* 31*  CREATININE 1.30 1.24 1.34 1.50* 1.39*  CALCIUM 8.8 8.7 8.5 8.9 8.7  MG -- -- -- -- --  PHOS -- -- -- -- --   Intake/Output      07/04 0701 - 07/05 0700 07/05 0701 - 07/06 0700   P.O. 540 600   I.V. (mL/kg)     IV Piggyback 100    Total Intake(mL/kg) 640 (8.2) 600 (7.7)   Urine (mL/kg/hr) 376 (0.2)    Total Output 376    Net +264 +600         Foley:  6/19>>7/1  A:   CKD,  baseline Cr in last year is 1.2-2.1. Mild acute renal insuff getting worse 6/24 hypokalemia hypernatremia  P:   -repleted K -f/u BMP   GASTROINTESTINAL  Lab 03/06/12 0430  AST 76*  ALT 46  ALKPHOS 170*  BILITOT 1.1  PROT 6.9  ALBUMIN 2.2*    A: Elevated LFTs - likely shock and/or congestion. Improved  P:   Monitor   HEMATOLOGIC  Lab 03/09/12 0530 03/08/12 0540 03/07/12 0500 03/06/12 0430 03/05/12 0420  HGB 11.6* 11.5* 11.0* 11.5* 11.4*  HCT 33.6* 32.9* 32.3* 33.6* 33.7*  PLT 547* 560* 484* 476* 388  INR -- -- -- -- --  APTT -- -- -- -- --   A:  No acute issues  P:  aim for Hb 7 or above    INFECTIOUS  Lab 03/09/12 0530 03/08/12 0540 03/07/12 0500 03/06/12 0430 03/05/12 0420  WBC 11.5* 10.8* 11.2* 12.5* 12.1*    PROCALCITON -- -- -- 0.56 --   Cultures: Blood X 2 6/22 >> ng Resp 6/22 >> strep pneumonia, hemophilus BAL 6/25 >> pseudomonas pan S  Ceftriaxone 6/22 >>6/25 Zosyn 6/25 >>6/29 Cefepime 6/29( pseudomonas in BAL, H flu, strep pna) >>7/6   A:   Sputum production  P:   -cefepime to complete course 7 ds on 03/10/12   ENDOCRINE  Lab 03/06/12 1243 03/06/12 0810 03/05/12 2333 03/05/12 1940 03/05/12 1621  GLUCAP 98 93 82 87 81   A:   History of glucose intolerance Episodes of hypoglycemia  P:   - Cont CBGs/SSI  NEUROLOGIC  A:    Head CT neg 6/20 eeg 6/25 slowing  Anxiety / Agitation  P:   -monitor -add PRN benzo, ? Hx of ETOH   BEST PRACTICE / DISPOSITION Level of Care: Tele  Primary Service: PCCM-->Tx to [facility] Cardiology Service.  PCCM will remain as pulmonary consultant. Consultants:, [facility] Code Status:  Full Diet:  PO DVT Px:  lovenox GI Px:  pepcid Social / Family: no family available    ALVA,RAKESH V.  [phone]

## 2012-03-10 LAB — BASIC METABOLIC PANEL
CO2: 19 mEq/L (ref 19–32)
Calcium: 9 mg/dL (ref 8.4–10.5)
Creatinine, Ser: 1.25 mg/dL (ref 0.50–1.35)
GFR calc non Af Amer: 57 mL/min — ABNORMAL LOW (ref >90)
Sodium: 135 mEq/L (ref 135–145)

## 2012-03-10 LAB — MAGNESIUM: Magnesium: 1.9 mg/dL (ref 1.5–2.5)

## 2012-03-10 MED ORDER — DILTIAZEM HCL 60 MG PO TABS
60.0000 mg | ORAL_TABLET | Freq: Four times a day (QID) | ORAL | Status: DC
  Administered 2012-03-10 – 2012-03-12 (×10): 60 mg via ORAL
  Filled 2012-03-10 (×16): qty 1

## 2012-03-10 MED ORDER — GUAIFENESIN 100 MG/5ML PO SYRP
300.0000 mg | ORAL_SOLUTION | Freq: Four times a day (QID) | ORAL | Status: DC | PRN
  Administered 2012-03-10 – 2012-03-13 (×2): 300 mg via ORAL
  Filled 2012-03-10 (×3): qty 118

## 2012-03-10 NOTE — Progress Notes (Signed)
  Patient Name: Logan White      SUBJECTIVE: s/p arrest in setting of 1 v 2 hypokalemia and significanta ischemic cardiomyopathy  prolnged intubation  Still with AF RVR and hypokalemia; post arrest echo EF 30%; BAE  Without sob or chest pain  Walking better  Past Medical History  Diagnosis Date  . Coronary artery disease   . Hypertension   . Diabetes mellitus   . Pneumonia   . Myocardial infarction   . Carotid artery stenosis     PHYSICAL EXAM Filed Vitals:   03/09/12 1952 03/09/12 2212 03/10/12 0221 03/10/12 0417  BP: 122/79  118/64 138/89  Pulse: 112  108 110  Temp: 97.8 F (36.6 C)  98.2 F (36.8 C) 98 F (36.7 C)  TempSrc: Oral  Oral Oral  Resp: 18  20 20   Height:      Weight:    167 lb 8.8 oz (76 kg)  SpO2: 94% 95% 94% 97%   Well developed and nourished in no acute distress HENT normal Neck supple with JVP-flat Carotids brisk and full without bruits Decreased breat sounds and wheezes r with ronchi Irregularly irregular rate and rhythm with  Rapid  ventricular response, no murmurs or gallops Abd-soft with active BS without hepatomegaly No Clubbing cyanosis edema Skin-warm and dry A & Oriented  Grossly normal sensory and motor function; weide based gait   TELEMETRY: Reviewed telemetry pt in af rVR:    Intake/Output Summary (Last 24 hours) at 03/10/12 1054 Last data filed at 03/10/12 0834  Gross per 24 hour  Intake    460 ml  Output    100 ml  Net    360 ml    LABS: Basic Metabolic Panel:  Lab [id number] 0500 03/09/12 0530 03/08/12 0540 03/07/12 0500 03/06/12 0430 03/05/12 0420 03/04/12 0430  NA 135 141 141 145 146* 145 149*  K 4.7 3.3* 3.0* 2.8* 3.4* 3.2* 3.3*  CL 101 101 101 103 107 106 109  CO2 19 28 29 28 28 29 31   GLUCOSE 112* 85 81 159* 109* 117* 142*  BUN 19 20 21  25* 32* 31* 31*  CREATININE 1.25 1.30 1.24 1.34 1.50* 1.39* 1.49*  CALCIUM 9.0 8.8 -- -- -- -- --  MG 1.9 -- -- -- -- -- --  PHOS -- -- -- -- -- -- --   Cardiac  Enzymes: No results found for this basename: CKTOTAL:3,CKMB:3,CKMBINDEX:3,TROPONINI:3 in the last 72 hours CBC:  Lab 03/09/12 0530 03/08/12 0540 03/07/12 0500 03/06/12 0430 03/05/12 0420 03/04/12 0430  WBC 11.5* 10.8* 11.2* 12.5* 12.1* 13.0*  NEUTROABS -- -- -- -- -- --  HGB 11.6* 11.5* 11.0* 11.5* 11.4* 11.9*  HCT 33.6* 32.9* 32.3* 33.6* 33.7* 34.7*  MCV 72.7* 73.1* 73.2* 73.5* 73.3* 73.1*  PLT 547* 560* 484* 476* 388 313     Echo  ASSESSMENT AND PLAN:  Patient Active Hospital Problem List: Cardiac arrest (02/23/2012)   Atrial fibrillation with rapid ventricular response (02/23/2012)     CKD (chronic kidney disease) (02/23/2012)     DM (diabetes mellitus) (02/23/2012)   Cardiogenic shock (02/25/2012)  * Hyupokalemia Anoxic encephalopathy (02/25/2012)   Ischemic cardiomyopathy (02/25/2012) with MR   Septic shock (02/27/2012)     Push   rate control Add cardiazem Cath mon ICD tues-hopefully and home wed  Signed, Virl Axe MD  03/10/2012

## 2012-03-10 NOTE — Progress Notes (Signed)
Name: Logan White MRN: [id number] DOB: 16-Dec-1940    LOS: 17  Referring Provider:  Alvino Chapel Reason for Referral:  Cardiac arrest  PULMONARY / CRITICAL CARE MEDICINE  PROFILE:  71 y/o male with CAD admitted 02/22/2012 after cardiac arrest. Initial rhythm VF. Defib X 2 by EMS in field. Duration of pulselessness > 12 minutes. Underwent [place] Sun protocol (completed 6/21 PM). Did not undergo cath.   Events Since Admission: 6/19 Head CT >>NAD 6/23 Severe agitation - on fent and diprivan 6/25 ETT changed, bronch, high residuals on TFs 6/26  agitation , back On fent and diprivan, Off pressors 6/29 hypotension with precedex, requiring fluid bolus & neo gtt 7/1 Extubated. RASS 0. + F/C  Current Status:  On room air with no increased wob.  Still with cough but very little mucus.   Vital Signs: Temp:  [97.5 F (36.4 C)-98.2 F (36.8 C)] 98 F (36.7 C) (07/06 0417) Pulse Rate:  [96-112] 110  (07/06 0417) Resp:  [18-20] 20  (07/06 0417) BP: (112-151)/(64-89) 138/89 mmHg (07/06 0417) SpO2:  [94 %-97 %] 97 % (07/06 0417) Weight:  [76 kg (167 lb 8.8 oz)] 76 kg (167 lb 8.8 oz) (07/06 0417)  Physical Examination: Gen: thin male in nad HEENT: nose without purulence or discharge PULM: bibasilar crackles, no wheezing CV: RRR Ext: no edema Neuro awake and alert, mildly confused , oriented x 2   Principal Problem:  *Cardiac arrest Active Problems:  Atrial fibrillation with rapid ventricular response  Acute respiratory failure with hypoxia  CKD (chronic kidney disease)  Ventricular fibrillation  DM (diabetes mellitus)  Cardiogenic shock  Acute renal failure  Anoxic encephalopathy  Ischemic cardiomyopathy  Septic shock   ASSESSMENT AND PLAN   A:  Acute respiratory failure due to cardiac arrest; smoker, suspect underlying COPD?? May have superimposed bronchitis with +culture. He has no increased wob, and mild nonproductive cough.  He is finishing up his abx today.  He has  excellent sats on RA.  P:   -BD as needed.  -stop antibiotics today   A:  Cardiac arrest due to V-fib in setting of profound hypokalemia.   Atrial fibrillation with RVR - recurred 6/30 History of CHF, Afib, HTN, CAD, Cerebrovascular disease Shock post arrest - likely cardiogenic EF 30%  P: per cardiology      [facility] M  [phone]

## 2012-03-11 ENCOUNTER — Inpatient Hospital Stay (HOSPITAL_COMMUNITY): Payer: Medicare Other

## 2012-03-11 DIAGNOSIS — I5021 Acute systolic (congestive) heart failure: Secondary | ICD-10-CM

## 2012-03-11 LAB — CBC
HCT: 35.9 % — ABNORMAL LOW (ref 39.0–52.0)
MCV: 74 fL — ABNORMAL LOW (ref 78.0–100.0)
Platelets: 658 10*3/uL — ABNORMAL HIGH (ref 150–400)
RBC: 4.85 MIL/uL (ref 4.22–5.81)
WBC: 14.9 10*3/uL — ABNORMAL HIGH (ref 4.0–10.5)

## 2012-03-11 LAB — PROTIME-INR: INR: 1.61 — ABNORMAL HIGH (ref 0.00–1.49)

## 2012-03-11 LAB — BASIC METABOLIC PANEL
BUN: 22 mg/dL (ref 6–23)
CO2: 23 mEq/L (ref 19–32)
Chloride: 98 mEq/L (ref 96–112)
Creatinine, Ser: 1.49 mg/dL — ABNORMAL HIGH (ref 0.50–1.35)
GFR calc Af Amer: 54 mL/min — ABNORMAL LOW (ref >90)
Potassium: 5.8 mEq/L — ABNORMAL HIGH (ref 3.5–5.1)

## 2012-03-11 LAB — PRO B NATRIURETIC PEPTIDE: Pro B Natriuretic peptide (BNP): 39057 pg/mL — ABNORMAL HIGH (ref 0–125)

## 2012-03-11 MED ORDER — ENOXAPARIN SODIUM 80 MG/0.8ML ~~LOC~~ SOLN
1.0000 mg/kg | Freq: Two times a day (BID) | SUBCUTANEOUS | Status: DC
  Administered 2012-03-11 – 2012-03-16 (×9): 70 mg via SUBCUTANEOUS
  Filled 2012-03-11 (×15): qty 0.8

## 2012-03-11 MED ORDER — ENOXAPARIN SODIUM 30 MG/0.3ML ~~LOC~~ SOLN
1.0000 mg/kg | Freq: Two times a day (BID) | SUBCUTANEOUS | Status: DC
  Filled 2012-03-11 (×2): qty 0.3

## 2012-03-11 MED ORDER — FUROSEMIDE 10 MG/ML IJ SOLN
80.0000 mg | Freq: Once | INTRAMUSCULAR | Status: AC
  Administered 2012-03-11: 80 mg via INTRAVENOUS
  Filled 2012-03-11: qty 8

## 2012-03-11 NOTE — Progress Notes (Signed)
ANTICOAGULATION CONSULT NOTE - Initial Consult  Pharmacy Consult for Coumadin Indication: atrial fibrillation  No Known Allergies  Patient Measurements: Height: 6\' 1"  (185.4 cm) Weight: 154 lb 15.7 oz (70.3 kg) (Scale B.) IBW/kg (Calculated) : 79.9    Vital Signs: Temp: 97.2 F (36.2 C) (07/07 1334) Temp src: Axillary (07/07 1334) BP: 134/93 mmHg (07/07 1334) Pulse Rate: 104  (07/07 1334)  Labs:  Basename 03/11/12 0858 03/11/12 0839 03/10/12 0500 03/09/12 0530  HGB -- 12.4* -- 11.6*  HCT -- 35.9* -- 33.6*  PLT -- 658* -- 547*  APTT -- -- -- --  LABPROT 19.4* -- -- --  INR 1.61* -- -- --  HEPARINUNFRC -- -- -- --  CREATININE -- 1.49* 1.25 1.30  CKTOTAL -- -- -- --  CKMB -- -- -- --  TROPONINI -- -- -- --    Estimated Creatinine Clearance: 47.2 ml/min (by C-G formula based on Cr of 1.49).   Medical History: Past Medical History  Diagnosis Date  . Coronary artery disease   . Hypertension   . Diabetes mellitus   . Pneumonia   . Myocardial infarction   . Carotid artery stenosis     Medications:  Scheduled:    . antiseptic oral rinse  15 mL Mouth Rinse BID  . aspirin  81 mg Oral Daily  . diltiazem  60 mg Oral Q6H  . enoxaparin (LOVENOX) injection  1 mg/kg Subcutaneous Q12H  . furosemide  80 mg Intravenous Once  . metoprolol tartrate  50 mg Oral BID  . potassium chloride  40 mEq Oral TID  . DISCONTD: enoxaparin  40 mg Subcutaneous Q24H  . DISCONTD: enoxaparin  1 mg/kg Subcutaneous Q12H    Assessment: 71 yr old male s/p cardiac arrest on coumadin for afib.  Patient currently on lovenox 70mg  sq bid.  Goal of Therapy:  INR 2-3 Monitor platelets by anticoagulation protocol: Yes   Plan:  Coumadin 7.5mg  po x 1 dose tonight. Daily PT/INR   Liz Beach 03/11/2012,1:52 PM

## 2012-03-11 NOTE — Progress Notes (Signed)
Pt is having more SOB on exertion, urged pt to deep breath, which helped a bit, gave 2 breathing tx, due to continued SOB and some episodes of hyperventilation put pt on nonrebreather and started at 3 L;  breathsounds now had crackles in both lobes earlier just some rhonchi; MD notified said they would come up to look at him; MD later ordered Lasix 80 mg IV 1 x dose and a chest x-ray due to the S/S, will continue to monitor, Thanks, Arvella Nigh RN

## 2012-03-11 NOTE — Progress Notes (Signed)
Pt converted back to sinus tach 108, with 1 degree HB, did a ECG to confirm, MD notified, will continue to monitor, thanks, Arvella Nigh RN

## 2012-03-11 NOTE — Progress Notes (Signed)
Pt had episode of pause of 3.37 sec, no S/S, BP 125/89, HR 112, pt still is A-fib with rate controlled, MD notified, will continue to monitor, Thanks, Arvella Nigh RN

## 2012-03-11 NOTE — Progress Notes (Signed)
Patient Name: Logan White Date of Encounter: 03/11/2012     Principal Problem:  *Cardiac arrest Active Problems:  Ventricular fibrillation  Ischemic cardiomyopathy  Acute systolic CHF (congestive heart failure)  Atrial fibrillation with rapid ventricular response  Acute respiratory failure with hypoxia  CKD (chronic kidney disease)  DM (diabetes mellitus)  Cardiogenic shock  Acute renal failure  Anoxic encephalopathy  Septic shock    SUBJECTIVE  Called to see pt this AM 2/2 increasing dyspnea.  Pt sitting up @ side of bed with NRB in place.  Some increase in WOB noted.  He says he's been short of breath like this x 2 wks.  RN notes significant difference in resp status since awakening this AM.  He denies chest pain, fevers, chills, cough.  CURRENT MEDS    . antiseptic oral rinse  15 mL Mouth Rinse BID  . aspirin  81 mg Oral Daily  . diltiazem  60 mg Oral Q6H  . enoxaparin  40 mg Subcutaneous Q24H  . furosemide  80 mg Intravenous Once  . metoprolol tartrate  50 mg Oral BID  . potassium chloride  40 mEq Oral TID  . DISCONTD: ceFEPime (MAXIPIME) IV  2 g Intravenous Q12H    OBJECTIVE  Filed Vitals:   03/10/12 1336 03/10/12 2030 03/11/12 0500 03/11/12 0645  BP: 130/92 125/89 137/86   Pulse: 107 112 101   Temp: 98 F (36.7 C) 97.7 F (36.5 C) 97.1 F (36.2 C)   TempSrc: Oral Oral Axillary   Resp: 18 16  28   Height:      Weight:   154 lb 15.7 oz (70.3 kg)   SpO2: 99% 96% 98% 97%    Intake/Output Summary (Last 24 hours) at 03/11/12 0750 Last data filed at 03/10/12 2300  Gross per 24 hour  Intake    540 ml  Output      0 ml  Net    540 ml   Filed Weights   03/05/12 0300 03/10/12 0417 03/11/12 0500  Weight: 172 lb 9.9 oz (78.3 kg) 167 lb 8.8 oz (76 kg) 154 lb 15.7 oz (70.3 kg)    PHYSICAL EXAM  General: Pleasant, increased WOB. Neuro: Alert and oriented X 3. Moves all extremities spontaneously. Psych: Flat affect. HEENT:  Normal  Neck: Supple  without bruits.  JVP mildly elevated. Lungs:  Resp regular, sl tachypnea.  Basilar crackles and coarse breath sounds throughout. Heart: Difficult to hear over coarse lung sounds.  Irregular, tachy. Abdomen: Soft, non-tender, non-distended, BS + x 4.  Extremities: No clubbing, cyanosis or edema. DP/PT/Radials 1+ and equal bilaterally.  Accessory Clinical Findings  CBC  Basename 03/09/12 0530  WBC 11.5*  NEUTROABS --  HGB 11.6*  HCT 33.6*  MCV 72.7*  PLT 123XX123*   Basic Metabolic Panel  Basename [id number] 0500 03/09/12 0530  NA 135 141  K 4.7 3.3*  CL 101 101  CO2 19 28  GLUCOSE 112* 85  BUN 19 20  CREATININE 1.25 1.30  CALCIUM 9.0 8.8  MG 1.9 --  PHOS -- --   TELE  Afib, 90's to low 100's.  Despite reports of sinus tach by nsg, review of tele does not show this.  ECG  Pending.  Radiology/Studies  Dg Chest Port 1 View  03/09/2012  *RADIOLOGY REPORT*  Clinical Data: Check endotracheal tube.  Shortness of breath  PORTABLE CHEST - 1 VIEW  Comparison: 03/08/2012  Findings: An endotracheal tube is not visualized.  No internal support apparatus is  seen.  Cardiomegaly and aortic ectasia with aortic calcification is stable in degree.  The lung fields demonstrate pulmonary vascular congestion, central peribronchial cuffing, interstitial septal lines and bibasilar alveolar density.  Some improvement in aeration is noted in comparison with the previous exam and the overall pattern is compatible with improving pulmonary edema.  A small right pleural effusion is suspected.  The left costophrenic angle has been excluded from film.  Bony structures demonstrate unchanged curvature of the thoracic spine and are otherwise stable.  IMPRESSION: Improving pulmonary edema pattern with some persistent basilar alveolar edema noted.  Original Report Authenticated By: Ander Gaster, M.D.   ASSESSMENT AND PLAN  1.  Acute Dyspnea:  Pt with acute dyspnea and increased wob this AM.  On exam, he has  evidence of mild volume overload.  He has very coarse breath sounds.  He is afebrile and denies chills.  I've given him a dose of IV lasix and will check pcxr now to r/o pna.  Last cxr showed improving ASD (7/5).  Cont O2 via face mask.  F/U CBC, BMET, pBNP.  2.  VF Arrest:  In setting of severe hypokalemia (2.2).  Ef initially 30% with diff HK, improved to 50-55% by repeat echo 7/5.  Plan for R & L heart cath tomorrow per prior notes.  For ICD prior to d/c.  3.  ICM/Acute systolic chf:  See #1.  EF now normalized.  Cont bb.  No acei/arb up to this point in setting of some degree of acute on chronic KD.    4.  Anoxic encephalopathy:  Stable.  5. Afib: Rates reasonable - 90's - low 100's.  Cont bb/dilt.  CHADS2VASc: 5 (chf, htn, dm - reported in hx but not on anything and cbg's ok, cad, age>65).  Role of anticoagulation not clear - ? Compliance.  Signed, Murray Hodgkins NP    Pt feeling better following lasix still with JVP and ronchi-- will continue IV lasix LABS pending, incl CBC and repeat BMET Add LMWH for VTE prophylaxis and TE risk with his afib--begin coumadin tonight  For Cath in am  Virl Axe, MD 03/11/2012 10:30 AM

## 2012-03-11 NOTE — Progress Notes (Signed)
Patient is very restless and anxious; patient continues to voids in the bed and on his gown; patient has been educated on the importance of using the urinal.  Patient continues to try and leave the unit and has been given Haldol; will continue to monitor patient______________________________________________________________D. Owens Shark RN

## 2012-03-12 ENCOUNTER — Encounter (HOSPITAL_COMMUNITY)
Admission: EM | Disposition: A | Discharge status: Home / Self Care | Payer: Self-pay | Source: Ambulatory Visit | Attending: Pulmonary Disease

## 2012-03-12 DIAGNOSIS — R404 Transient alteration of awareness: Secondary | ICD-10-CM

## 2012-03-12 DIAGNOSIS — F05 Delirium due to known physiological condition: Secondary | ICD-10-CM

## 2012-03-12 DIAGNOSIS — R410 Disorientation, unspecified: Secondary | ICD-10-CM

## 2012-03-12 LAB — HEPATIC FUNCTION PANEL
ALT: 34 U/L (ref 0–53)
AST: 35 U/L (ref 0–37)
Albumin: 2.8 g/dL — ABNORMAL LOW (ref 3.5–5.2)
Alkaline Phosphatase: 161 U/L — ABNORMAL HIGH (ref 39–117)
Indirect Bilirubin: 0.5 mg/dL (ref 0.3–0.9)
Total Protein: 7.6 g/dL (ref 6.0–8.3)

## 2012-03-12 LAB — BASIC METABOLIC PANEL
BUN: 23 mg/dL (ref 6–23)
Calcium: 9.2 mg/dL (ref 8.4–10.5)
Chloride: 99 mEq/L (ref 96–112)
Creatinine, Ser: 1.66 mg/dL — ABNORMAL HIGH (ref 0.50–1.35)
GFR calc Af Amer: 47 mL/min — ABNORMAL LOW (ref >90)
GFR calc non Af Amer: 41 mL/min — ABNORMAL LOW (ref >90)

## 2012-03-12 LAB — PROTIME-INR
INR: 1.52 — ABNORMAL HIGH (ref 0.00–1.49)
Prothrombin Time: 18.6 seconds — ABNORMAL HIGH (ref 11.6–15.2)

## 2012-03-12 SURGERY — LEFT AND RIGHT HEART CATHETERIZATION WITH CORONARY ANGIOGRAM
Anesthesia: LOCAL

## 2012-03-12 MED ORDER — WARFARIN SODIUM 5 MG PO TABS
5.0000 mg | ORAL_TABLET | Freq: Once | ORAL | Status: AC
  Administered 2012-03-12: 5 mg via ORAL
  Filled 2012-03-12: qty 1

## 2012-03-12 MED ORDER — LORAZEPAM 2 MG/ML IJ SOLN
1.0000 mg | Freq: Once | INTRAMUSCULAR | Status: AC
  Administered 2012-03-12: 1 mg via INTRAVENOUS
  Filled 2012-03-12: qty 1

## 2012-03-12 MED ORDER — HALOPERIDOL LACTATE 5 MG/ML IJ SOLN
2.0000 mg | Freq: Once | INTRAMUSCULAR | Status: AC
  Administered 2012-03-12: 2 mg via INTRAVENOUS
  Filled 2012-03-12 (×2): qty 0.4

## 2012-03-12 MED ORDER — WARFARIN - PHARMACIST DOSING INPATIENT
Freq: Every day | Status: DC
  Administered 2012-03-12: 1
  Administered 2012-03-13 – 2012-03-14 (×2)

## 2012-03-12 MED ORDER — BENZTROPINE MESYLATE 0.5 MG PO TABS
0.5000 mg | ORAL_TABLET | Freq: Two times a day (BID) | ORAL | Status: DC | PRN
  Administered 2012-03-12: 0.5 mg via ORAL
  Filled 2012-03-12 (×2): qty 1

## 2012-03-12 MED ORDER — HALOPERIDOL LACTATE 5 MG/ML IJ SOLN
1.0000 mg | INTRAMUSCULAR | Status: DC | PRN
  Administered 2012-03-12: 1 mg via INTRAVENOUS
  Filled 2012-03-12: qty 0.2

## 2012-03-12 MED ORDER — TRAZODONE HCL 100 MG PO TABS
100.0000 mg | ORAL_TABLET | Freq: Every day | ORAL | Status: DC
  Administered 2012-03-12 – 2012-03-15 (×4): 100 mg via ORAL
  Filled 2012-03-12 (×5): qty 1

## 2012-03-12 NOTE — Progress Notes (Signed)
SLP Cancellation Note  Treatment cancelled today due to medical issues with patient which prohibited therapy.  Per RN, episode of aggression noted last night, treated with Haldol and Ativan, now resting. Asked to hold treatment today and f/u 7/9.   [name], CCC-SLP [phone]   Logan White Meryl 03/12/2012, 2:00 PM

## 2012-03-12 NOTE — Consult Note (Signed)
Clinical Social Work Department CLINICAL SOCIAL WORK PSYCHIATRY SERVICE LINE ASSESSMENT 03/12/2012  Patient:  Logan White  Account:  [id number]  Admit Date:  02/22/2012  Clinical Social Worker:  Caleen Essex, LCSW  Date/Time:  03/12/2012 11:52 AM Referred by:  Physician  Date referred:  03/12/2012 Reason for Referral  Behavioral Health Issues   Presenting Symptoms/Problems (In the person's/family's own words):   Acute Delirium.  Patient has been having behavioral outburst, agitation, restlessness, and not sleeping.  Sitter at the bedside for safety.   Abuse/Neglect/Trauma History (check all that apply)  Denies history   Abuse/Neglect/Trauma Comments:   none reported per wife   Psychiatric History (check all that apply)  Denies history   Psychiatric medications:  None reported by wife   Current Mental Health Hospitalizations/Previous Mental Health History:   none reported by wife   Current provider:   PCP  No outpatient Psych f.u   Place and Date:   none   Current Medications:    Scheduled Meds:   . antiseptic oral rinse  15 mL Mouth Rinse BID  . aspirin  81 mg Oral Daily  . diltiazem  60 mg Oral Q6H  . enoxaparin (LOVENOX) injection  1 mg/kg Subcutaneous Q12H  . haloperidol lactate  2 mg Intravenous Once  . LORazepam  1 mg Intravenous Once  . metoprolol tartrate  50 mg Oral BID  . warfarin  5 mg Oral ONCE-1800  . Warfarin - Pharmacist Dosing Inpatient   Does not apply q1800   Continuous Infusions:  PRN Meds:.acetaminophen (TYLENOL) oral liquid 160 mg/5 mL, albuterol, guaifenesin, haloperidol lactate, LORazepam, metoprolol Previous Impatient Admission/Date/Reason:   none reported by wife   Emotional Health / Current Symptoms    Suicide/Self Harm  None reported   Suicide attempt in the past:   None reported by wife currently or history   Other harmful behavior:   Patient has been acting out while in the hospital. Confused and combative, not baseline per  wife.    At home patient was doing very well and independent. No outburst of behavioral/MH problems   Psychotic/Dissociative Symptoms  Delusional  Confusion   Other Psychotic/Dissociative Symptoms:   Per chart review and MD report: Patient became combative and refusing treatment.  He started threatening staff, throwing objects and trying to walk into other patient's rooms.  Sitter and medication had to control patient.    Attention/Behavioral Symptoms  Physicial aggression  Restless  Withdrawn  Verbal aggression   Other Attention / Behavioral Symptoms:   Behavioral problems listed above were events from the previous night and not an accurate reflection of patient's baseline.  Wife at the bedside this am, and patient is resting comfortably in his bed, no agitation noted nor aggression.  Patient has been sleeping all morning.    Wife reports at home is WNL with no problems.    Cognitive Impairment  Impairment due to current medical condition/treatment (stroke,reaction to medication,reaction to infections,etc)   Other Cognitive Impairment:   Patient typically is alert and oriented/independent at home.    Mood and Adjustment  Aggressive/frustrated  Confused    Stress, Anxiety, Trauma, Any Recent Loss/Stressor  None reported   Anxiety (frequency):   Phobia (specify):   Compulsive behavior (specify):   Obsessive behavior (specify):   Other:   Patient has been in the hospital for a long period of time. Wife reports exhaustion but she is doing better.    Patient not currently at his baseline causing increased confusion and agitation  Substance Abuse/Use  Current substance use   SBIRT completed (please refer for detailed history):  N  Self-reported substance use:   None reported,  however patient tested positive for opiates and THC   Urinary Drug Screen Completed:  Y Alcohol level:   ETOH none  Positive for opiates and THC    Environmental/Housing/Living  Arrangement  Stable housing   Who is in the home:   Wife: [name]   Emergency contact:  Wife contact information in chart   Financial  Medicare   Patient's Strengths and Goals (patient's own words):   Patient was asleep and not engaged   Clinical Social Worker's Interpretive Summary:   Upon assessment chart was reviewed. At this time patient is not able to participate in assessment and wife is reporting all information above.  Reports no previous mental health diagnosis, nor inpatient admissions.  Reports current behaviors are not typical of patient or baseline.  Wife reports she lives at home with him, and unclear of involvement, but reports patient did for himself at home and did not require any assistance.    CIR is reviewing patient for possible admission as disposition, however wife reports he is going home.  Medication management and  behavioral symptom management will continue and follow up.   Disposition:  Recommend Psych CSW continuing to support while in hospital   Caleen Essex, MSW LCSW [phone]
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
## 2012-03-12 NOTE — Progress Notes (Signed)
Noted events over the weekend. I will follow up as medical workup pending and functional progress with therapies.[phone] with questions.

## 2012-03-12 NOTE — Progress Notes (Signed)
Pt continues to be combative refusing VS, refusing meds, threatening the staff, throwing objects at staff, and trying to walk in other pt's rooms, called MD gave order to give Ativan IV and another dose of Haldol IV, RN called security again to help with administering the IV meds, meds were given, pt calmed down and sleept alittle bit, much calmer now, will continue to monitor

## 2012-03-12 NOTE — Progress Notes (Signed)
ANTICOAGULATION CONSULT NOTE - Follow Up Consult  Pharmacy Consult for  Lovenox / Coumadin Indication: atrial fibrillation  No Known Allergies   Labs:  Basename 03/11/12 0858 03/11/12 0839 03/10/12 0500  HGB -- 12.4* --  HCT -- 35.9* --  PLT -- 658* --  APTT -- -- --  LABPROT 19.4* -- --  INR 1.61* -- --  HEPARINUNFRC -- -- --  CREATININE -- 1.49* 1.25  CKTOTAL -- -- --  CKMB -- -- --  TROPONINI -- -- --    Estimated Creatinine Clearance: 47.2 ml/min (by C-G formula based on Cr of 1.49).  Assessment: Refusing treatment today No bleeding noted  Goal of Therapy:  INR 2-3 Monitor platelets by anticoagulation protocol: Yes   Plan:  1) Continue Lovenox 70 mg sq BID 2) Coumadin 5 mg po x 1 dose today 3) Follow up AM labs  Thank you. Anette Guarneri, PharmD Clinical Pharmacist [phone]

## 2012-03-12 NOTE — Progress Notes (Signed)
Pt has been refusing meds, tele box, care and VS, pt is calm and resting when not asked to do something, but gets combative when asked for VS or to be given meds, MD notified, pt has order to be given Haldol IV PRN for agitation, will try to give med, will continue to monitor, Thanks, Arvella Nigh RN

## 2012-03-12 NOTE — Progress Notes (Signed)
  Patient Name: Logan White      SUBJECTIVE: acuttely confused this am and threatening people with "his nine." Can not get any other history from him Yesterday ws quite lucid and understood plan ( ithought)  Past Medical History  Diagnosis Date  . Coronary artery disease   . Hypertension   . Diabetes mellitus   . Pneumonia   . Myocardial infarction   . Carotid artery stenosis     PHYSICAL EXAM Filed Vitals:   03/11/12 1052 03/11/12 1334 03/11/12 1723 03/11/12 2338  BP: 136/92 134/93 114/90   Pulse: 107 104 105 75  Temp: 97.9 F (36.6 C) 97.2 F (36.2 C)    TempSrc: Oral Axillary    Resp: 22 21 20 24   Height:      Weight:      SpO2: 98% 90% 100% 94%   BP 114/90  Pulse 75  Temp 97.2 F (36.2 C) (Axillary)  Resp 24  Ht 6\' 1"  (1.854 m)  Wt 154 lb 15.7 oz (70.3 kg)  BMI 20.45 kg/m2  SpO2 94% Pt refused examination  TELEMETRY: Reviewed telemetry pt in afib:    Intake/Output Summary (Last 24 hours) at 03/12/12 0755 Last data filed at 03/11/12 1700  Gross per 24 hour  Intake    340 ml  Output    875 ml  Net   -535 ml    LABS: Basic Metabolic Panel:  Lab [id number] 0839 03/10/12 0500 03/09/12 0530 03/08/12 0540 03/07/12 0500 03/06/12 0430  NA 136 135 141 141 145 146*  K 5.8* 4.7 3.3* 3.0* 2.8* 3.4*  CL 98 101 101 101 103 107  CO2 23 19 28 29 28 28   GLUCOSE 91 112* 85 81 159* 109*  BUN 22 19 20 21  25* 32*  CREATININE 1.49* 1.25 1.30 1.24 1.34 1.50*  CALCIUM 9.4 9.0 -- -- -- --  MG -- 1.9 -- -- -- --  PHOS -- -- -- -- -- --   Cardiac Enzymes: No results found for this basename: CKTOTAL:3,CKMB:3,CKMBINDEX:3,TROPONINI:3 in the last 72 hours CBC:  Lab 03/11/12 0839 03/09/12 0530 03/08/12 0540 03/07/12 0500 03/06/12 0430  WBC 14.9* 11.5* 10.8* 11.2* 12.5*  NEUTROABS -- -- -- -- --  HGB 12.4* 11.6* 11.5* 11.0* 11.5*  HCT 35.9* 33.6* 32.9* 32.3* 33.6*  MCV 74.0* 72.7* 73.1* 73.2* 73.5*  PLT 658* 547* 560* 484* 476*   PROTIME:  Basename 03/11/12  0858  LABPROT 19.4*  INR 1.61*      ASSESSMENT AND PLAN:  Patient Active Hospital Problem List:   Acute confusion/delerium (03/12/2012) Cardiac arrest (02/23/2012)   Atrial fibrillation with rapid ventricular response (02/23/2012)       Ventricular fibrillation (02/23/2012)   DM (diabetes mellitus    Acute renal failure (02/25/2012)   Anoxic encephalopathy (02/25/2012)   Ischemic cardiomyopathy (02/25/2012)       Acute systolic CHF (congestive heart failure) (03/11/2012)     right now, cardiac eval and treatements are on hold as the pt is refusing both.  We have no way of following up on hyperkalemia from yesterday or treating either his myopthy or afib  I have called psych for STAT help he is with a sitter I spoke w him for about 20 minutes and was not able to reconnect with him    Signed, Virl Axe MD  03/12/2012

## 2012-03-13 ENCOUNTER — Inpatient Hospital Stay (HOSPITAL_COMMUNITY): Payer: Medicare Other

## 2012-03-13 ENCOUNTER — Encounter (HOSPITAL_COMMUNITY)
Admission: EM | Disposition: A | Discharge status: Home / Self Care | Payer: Self-pay | Source: Ambulatory Visit | Attending: Pulmonary Disease

## 2012-03-13 DIAGNOSIS — R4182 Altered mental status, unspecified: Secondary | ICD-10-CM

## 2012-03-13 LAB — GLUCOSE, CAPILLARY: Glucose-Capillary: 90 mg/dL (ref 70–99)

## 2012-03-13 LAB — URINE MICROSCOPIC-ADD ON

## 2012-03-13 LAB — URINALYSIS, ROUTINE W REFLEX MICROSCOPIC
Glucose, UA: NEGATIVE mg/dL
Leukocytes, UA: NEGATIVE
Protein, ur: 100 mg/dL — AB
Specific Gravity, Urine: 1.016 (ref 1.005–1.030)
pH: 6.5 (ref 5.0–8.0)

## 2012-03-13 LAB — PROTIME-INR: INR: 1.53 — ABNORMAL HIGH (ref 0.00–1.49)

## 2012-03-13 SURGERY — IMPLANTABLE CARDIOVERTER DEFIBRILLATOR IMPLANT

## 2012-03-13 MED ORDER — WARFARIN SODIUM 5 MG PO TABS
5.0000 mg | ORAL_TABLET | Freq: Once | ORAL | Status: AC
  Administered 2012-03-13: 5 mg via ORAL
  Filled 2012-03-13: qty 1

## 2012-03-13 MED ORDER — GUAIFENESIN 100 MG/5ML PO SOLN
15.0000 mL | Freq: Four times a day (QID) | ORAL | Status: DC | PRN
  Administered 2012-03-13: 300 mg via ORAL
  Filled 2012-03-13: qty 15

## 2012-03-13 MED ORDER — ENSURE COMPLETE PO LIQD
237.0000 mL | Freq: Two times a day (BID) | ORAL | Status: DC
  Administered 2012-03-13 – 2012-03-16 (×5): 237 mL via ORAL

## 2012-03-13 NOTE — Progress Notes (Signed)
Pt had a 7 beat run of vtach on the monitor.  Pt asymptomatic at this time.  MD has been made aware.  New orders given.  Will continue to monitor.  Gae Gallop RN
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
## 2012-03-13 NOTE — Progress Notes (Signed)
   Patient Name: Logan White      SUBJECTIVE: alttile better this am with somec\sob butnochest pain  Past Medical History  Diagnosis Date  . Coronary artery disease   . Hypertension   . Diabetes mellitus   . Pneumonia   . Myocardial infarction   . Carotid artery stenosis     PHYSICAL EXAM Filed Vitals:   03/12/12 1028 03/12/12 1433 03/12/12 2022 03/13/12 0423  BP: 122/73 104/61 124/68 101/55  Pulse: 64 54 49 41  Temp: 97.8 F (36.6 C) 97.8 F (36.6 C) 97.8 F (36.6 C) 98 F (36.7 C)  TempSrc: Oral Axillary Oral Oral  Resp: 22 20 22 24   Height:      Weight:    164 lb 3.2 oz (74.481 kg)  SpO2: 94% 94% 96% 98%   Well developed and nourished in no acute distress HENT normal Neck supple   ronchi r>l Regular rate and rhythm, no murmurs or gallops Abd-soft with active BS without hepatomegaly No Clubbing cyanosis edema Skin-warm and dry A but not orientrd  Grossly normal sensory and motor function        Intake/Output Summary (Last 24 hours) at 03/13/12 0830 Last data filed at 03/12/12 2246  Gross per 24 hour  Intake    120 ml  Output      0 ml  Net    120 ml    LABS: Basic Metabolic Panel:  Lab [id number] 0500 03/11/12 0839 03/10/12 0500 03/09/12 0530 03/08/12 0540 03/07/12 0500  NA 137 136 135 141 141 145  K 4.2 5.8* 4.7 3.3* 3.0* 2.8*  CL 99 98 101 101 101 103  CO2 24 23 19 28 29 28   GLUCOSE 89 91 112* 85 81 159*  BUN 23 22 19 20 21  25*  CREATININE 1.66* 1.49* 1.25 1.30 1.24 1.34  CALCIUM 9.2 9.4 -- -- -- --  MG -- -- 1.9 -- -- --  PHOS -- -- -- -- -- --   Cardiac Enzymes: No results found for this basename: CKTOTAL:3,CKMB:3,CKMBINDEX:3,TROPONINI:3 in the last 72 hours CBC:  Lab 03/11/12 0839 03/09/12 0530 03/08/12 0540 03/07/12 0500  WBC 14.9* 11.5* 10.8* 11.2*  NEUTROABS -- -- -- --  HGB 12.4* 11.6* 11.5* 11.0*  HCT 35.9* 33.6* 32.9* 32.3*  MCV 74.0* 72.7* 73.1* 73.2*  PLT 658* 547* 560* 484*   PROTIME:  Basename 03/13/12 0520  03/12/12 0500 03/11/12 0858  LABPROT 18.7* 18.6* 19.4*  INR 1.53* 1.52* 1.61*   Liver Function Tests:  Basename 03/12/12 1810  AST 35  ALT 34  ALKPHOS 161*  BILITOT 1.0  PROT 7.6  ALBUMIN 2.8*      ASSESSMENT AND PLAN:  Patient Active Hospital Problem List: Cardiac arrest (02/23/2012)   Atrial fibrillation with rapid ventricular response (02/23/2012)   CKD (chronic kidney disease) (02/23/2012)       Anoxic encephalopathy (02/25/2012)   Ischemic cardiomyopathy (A999333)   Acute systolic CHF (congestive heart failure) (03/11/2012)   Delirium (03/12/2012)   Better  Will continue cardiac meds  Will defer cath and anticipaTE MYOVIEW  MAYBE THURS WILL HAVE to decide as we go through week re ICD given cnfusion  Signed, Virl Axe MD  03/13/2012

## 2012-03-13 NOTE — Progress Notes (Signed)
Physical Therapy Treatment Patient Details Name: Logan White MRN: [id number] DOB: 1941/03/05 Today's Date: 03/13/2012 Time: [id number] PT Time Calculation (min): 15 min  PT Assessment / Plan / Recommendation Comments on Treatment Session  Pt adm with cardiac arrest.  Pt's mobility slowly improving but cognition remains poor.  Doubt pt's wife will be able to manage him after inpatient rehab stay.  Feel pt will need ST-SNF to allow longer recovery time.    Follow Up Recommendations  Skilled nursing facility    Barriers to Discharge        Equipment Recommendations  Defer to next venue    Recommendations for Other Services    Frequency Min 2X/week   Plan Discharge plan needs to be updated;Frequency needs to be updated    Precautions / Restrictions Precautions Precautions: Fall Restrictions Weight Bearing Restrictions: No   Pertinent Vitals/Pain N/A    Mobility  Transfers Stand to Sit: 4: Min assist;With armrests;With upper extremity assist;To chair/3-in-1 Details for Transfer Assistance: cues for hand placement.  Pt up with sitter when session initiated. Ambulation/Gait Ambulation/Gait Assistance: 4: Min assist Ambulation Distance (Feet): 125 Feet Assistive device: Rolling walker Ambulation/Gait Assistance Details: Repeated cues to stay closer to walker and to stand more erect. Gait Pattern: Narrow base of support;Trunk flexed;Left flexed knee in stance;Right flexed knee in stance;Step-through pattern    Exercises     PT Diagnosis:    PT Problem List:   PT Treatment Interventions:     PT Goals Acute Rehab PT Goals PT Goal: Sit to Stand - Progress: Progressing toward goal PT Goal: Stand to Sit - Progress: Progressing toward goal PT Goal: Ambulate - Progress: Progressing toward goal  Visit Information  Last PT Received On: 03/13/12 Assistance Needed: +1    Subjective Data  Subjective: "Let me sit down and rest a minute."  Pt with minimal talk during session.     Cognition  Overall Cognitive Status: Impaired Arousal/Alertness: Awake/alert Behavior During Session: Flat affect Safety/Judgement: Decreased awareness of safety precautions;Decreased safety judgement for tasks assessed;Decreased awareness of need for assistance    Balance  Static Standing Balance Static Standing - Balance Support: Bilateral upper extremity supported (on walker) Static Standing - Level of Assistance: 4: Min assist  End of Session PT - End of Session Equipment Utilized During Treatment: Gait belt Activity Tolerance: Patient tolerated treatment well Patient left: in chair;with call bell/phone within reach;Other (comment) (with sitter present)   GP     Letanya Froh 03/13/2012, 10:20 AM  [place]

## 2012-03-13 NOTE — Progress Notes (Signed)
Patient ID: Logan White  male  [id number]    DOB: 1941/07/05    DOA: 02/22/2012  PCP: No primary provider on file.                                           Internal medicine consult followup:   Subjective: Patient much more calm today, sitting in the chair but not very forthcoming with answers. No acute issues overnight, poor by mouth intake.  Objective: Weight change:   Intake/Output Summary (Last 24 hours) at 03/13/12 1346 Last data filed at 03/12/12 2246  Gross per 24 hour  Intake    120 ml  Output      0 ml  Net    120 ml   Blood pressure 125/74, pulse 53, temperature 98 F (36.7 C), temperature source Oral, resp. rate 24, height 6\' 1"  (1.854 m), weight 74.481 kg (164 lb 3.2 oz), SpO2 98.00%.  Physical Exam: General: Alert and awake, not in any acute distress. HEENT: anicteric sclera, pupils reactive to light and accommodation, EOMI CVS: S1-S2 clear, no murmur rubs or gallops Chest: Scattered rhonchi bilaterally Abdomen: soft nontender, nondistended, normal bowel sounds, no organomegaly Extremities: no cyanosis, clubbing or edema noted bilaterally Neuro: Cranial nerves II-XII intact, no focal neurological deficits  Lab Results: Basic Metabolic Panel:  Lab [id number] 0500 03/11/12 0839 03/10/12 0500  NA 137 136 --  K 4.2 5.8* --  CL 99 98 --  CO2 24 23 --  GLUCOSE 89 91 --  BUN 23 22 --  CREATININE 1.66* 1.49* --  CALCIUM 9.2 9.4 --  MG -- -- 1.9  PHOS -- -- --   Liver Function Tests:  Lab 03/12/12 1810  AST 35  ALT 34  ALKPHOS 161*  BILITOT 1.0  PROT 7.6  ALBUMIN 2.8*   No results found for this basename: LIPASE:2,AMYLASE:2 in the last 168 hours  Lab 03/12/12 1810  AMMONIA 30   CBC:  Lab 03/11/12 0839 03/09/12 0530  WBC 14.9* 11.5*  NEUTROABS -- --  HGB 12.4* 11.6*  HCT 35.9* 33.6*  MCV 74.0* 72.7*  PLT 658* 547*   Cardiac Enzymes: No results found for this basename: CKTOTAL:3,CKMB:3,CKMBINDEX:3,TROPONINI:3 in the last 168  hours BNP: No components found with this basename: POCBNP:2 CBG:  Lab 03/13/12 0609  GLUCAP 90     Micro Results: No results found for this or any previous visit (from the past 240 hour(s)).  Studies/Results: Ct Head Wo Contrast  02/23/2012  *RADIOLOGY REPORT*  Clinical Data: Found unresponsive.  Status post CPR.  CT HEAD WITHOUT CONTRAST  Technique:  Contiguous axial images were obtained from the base of the skull through the vertex without contrast.  Comparison: None.  Findings: There is no evidence of intracranial hemorrhage, brain edema or other signs of acute infarction.  There is no evidence of intracranial mass lesion or mass effect.  No abnormal extra-axial fluid collections are identified.  Mild diffuse cerebral atrophy is demonstrated.  No evidence of hydrocephalus.  No other intracranial abnormality identified.  No skull abnormality noted.  A high frontal scalp subcutaneous lipoma is seen measuring approximately 1.2 x 2.9 cm.  IMPRESSION:  1.  No acute intracranial abnormality. 2.  Mild cerebral atrophy. 3.  2.9 cm high frontal scalp subcutaneous lipoma incidentally noted  Original Report Authenticated By: Marlaine Hind, M.D.   Dg Chest Port 1 View  03/11/2012  *  RADIOLOGY REPORT*  Clinical Data: Dyspnea, evaluate for pneumonia, pulmonary edema  PORTABLE CHEST - 1 VIEW  Comparison: 03/09/2012; 03/08/2012; 03/06/2012  Findings:  Grossly unchanged enlarged cardiac silhouette and mediastinal contours.  The pulmonary vascular is indistinct with cephalization of flow.  A small amount of fluid is seen layering along the right minor fissure.  Minimal increase in perihilar and medial basilar heterogeneous opacities.  Query small right-sided pleural effusion. No pneumothorax.  Unchanged bones.  IMPRESSION:  1.   Overall findings most suggestive of worsening pulmonary edema, now with likely small right-sided pleural effusion. 2.  Worsening perihilar and medial basilar heterogeneous opacities,  atelectasis versus infiltrate.  Original Report Authenticated By: Rachel Moulds, M.D.   Dg Chest Port 1 View  03/09/2012  *RADIOLOGY REPORT*  Clinical Data: Check endotracheal tube.  Shortness of breath  PORTABLE CHEST - 1 VIEW  Comparison: 03/08/2012  Findings: An endotracheal tube is not visualized.  No internal support apparatus is seen.  Cardiomegaly and aortic ectasia with aortic calcification is stable in degree.  The lung fields demonstrate pulmonary vascular congestion, central peribronchial cuffing, interstitial septal lines and bibasilar alveolar density.  Some improvement in aeration is noted in comparison with the previous exam and the overall pattern is compatible with improving pulmonary edema.  A small right pleural effusion is suspected.  The left costophrenic angle has been excluded from film.  Bony structures demonstrate unchanged curvature of the thoracic spine and are otherwise stable.  IMPRESSION: Improving pulmonary edema pattern with some persistent basilar alveolar edema noted.  Original Report Authenticated By: Ander Gaster, M.D.   Dg Chest Port 1 View  03/08/2012  *RADIOLOGY REPORT*  Clinical Data: Chest pain and congestion.  PORTABLE CHEST - 1 VIEW  Comparison: Chest 03/06/2012.  Findings: Right IJ catheter has been removed.  Right worse than left airspace disease persist but shows some improvement.  There is cardiomegaly.  No pneumothorax.  IMPRESSION: Some improvement in right worse than left airspace disease.  Original Report Authenticated By: Arvid Right. Luther Parody, M.D.   Dg Chest Port 1 View  03/06/2012  *RADIOLOGY REPORT*  Clinical Data: Cough, shortness of breath, congestion.  PORTABLE CHEST - 1 VIEW  Comparison: 03/06/2012  Findings: Cardiomegaly.  Bilateral airspace opacities and effusions persist, unchanged.  Findings compatible with mild to moderate CHF. Right central line is unchanged.  IMPRESSION: Continued CHF.  Small effusions.  No real change.  Original Report  Authenticated By: Raelyn Number, M.D.   Dg Chest Port 1 View  03/06/2012  *RADIOLOGY REPORT*  Clinical Data: Respiratory distress.  Extubation.  Follow up CHF.  PORTABLE CHEST - 1 VIEW 03/06/2012 0440 hours:  Comparison: Portable chest x-rays yesterday and dating back to 03/01/2012.  Findings: Interval extubation.  Cardiac silhouette enlarged but stable.  Moderate interstitial and airspace pulmonary edema, unchanged.  Bilateral pleural effusions and associated passive atelectasis in the lower lobes, unchanged.  No new pulmonary parenchymal abnormalities.  Right jugular central venous catheter tip remains in the upper SVC.  IMPRESSION: Extubation.  Stable mild CHF.  Stable small bilateral pleural effusions and associated passive atelectasis in the lower lobes. No new abnormalities.  Original Report Authenticated By: Deniece Portela, M.D.   Dg Chest Port 1 View  03/05/2012  *RADIOLOGY REPORT*  Clinical Data: Follow up of airspace disease/pulmonary edema. Hypertension.  Diabetes.  Coronary artery disease.  PORTABLE CHEST - 1 VIEW  Comparison: 03/04/2012  Findings: Endotracheal tube terminates at the level of the clavicles.  Nasogastric  tube extends beyond the  inferior aspect of the film.  Right IJ central line at mid SVC.  Patient rotated to the left.  Mild cardiomegaly.  Mild interstitial edema is new or increased.  Developing right and likely similar left pleural effusion. No pneumothorax.  Similar left and worsened right base air space disease.  IMPRESSION:  1.  Developing or increased mild congestive heart failure. 2.  Worsened right base aeration with developing airspace disease and pleural fluid. 3.  Similar left base air space disease and pleural fluid.  Original Report Authenticated By: Areta Haber, M.D.   Dg Chest Port 1 View  03/04/2012  *RADIOLOGY REPORT*  Clinical Data: Pneumonia  PORTABLE CHEST - 1 VIEW  Comparison:   the previous day's study  Findings: Endotracheal tube, right IJ central  line, and nasogastric tube remain in place.  Stable cardiomegaly.  Improved aeration with mild perihilar and infrahilar interstitial prominence, some decrease in the interstitial edema or infiltrate seen previously. There is persistent left retrocardiac consolidation / atelectasis. The airspace opacities at the right lung base are improved.  No definite effusion.  IMPRESSION:  1.  Interval improvement in bilateral edema or infiltrates. 2.  Stable cardiomegaly. 3. Support hardware stable in position.  Original Report Authenticated By: Trecia Rogers, M.D.   Dg Chest Port 1 View  03/03/2012  *RADIOLOGY REPORT*  Clinical Data: Pulmonary airspace disease.  Coronary artery disease.  PORTABLE CHEST - 1 VIEW  Comparison: 03/02/2012  Findings: The patient is rotated to the left on today's exam, resulting in reduced diagnostic sensitivity and specificity. Endotracheal tube tip is 2.8 cm above the carina.  Right IJ line tip:  SVC.  Nasogastric tube extends down into the stomach.  Atherosclerotic calcification of the aortic arch is present.  Increased right lower lobe airspace opacity noted with obscuration of the right hemidiaphragm.  Persistent left lower lobe airspace opacity is present.  Mild cardiomegaly noted.  IMPRESSION:  1.  Worsened airspace opacity in the right lower lobe with new obscuration the right hemidiaphragm.  Persistent left lower lobe airspace opacity.  The appearance may reflect atelectasis or pneumonia. 2.  Atherosclerosis. 3.  Cardiomegaly.  Original Report Authenticated By: Carron Curie, M.D.   Dg Chest Port 1 View  03/02/2012  *RADIOLOGY REPORT*  Clinical Data: Follow up edema and airspace disease.  PORTABLE CHEST - 1 VIEW  Comparison: 03/01/2012  Findings: Endotracheal tube is 3.8 cm above the carina.  There is enlargement of the cardiac silhouette.  Persistent airspace disease at the right lung base and persistent retrocardiac opacification. The aortic arch is heavily calcified.   Central line in the upper SVC region.  Nasogastric tube extends into the abdomen.  IMPRESSION:  Persistent bibasilar airspace disease.  Cardiomegaly.  Stable support apparatuses.  Original Report Authenticated By: Markus Daft, M.D.   Dg Chest Port 1 View  03/01/2012  *RADIOLOGY REPORT*  Clinical Data: Follow up pulmonary edema  PORTABLE CHEST - 1 VIEW  Comparison: 02/29/2012  Findings: Cardiomegaly again noted.  Endotracheal tube in place with tip 4.8 cm above the carina.  Stable NG tube position.  Stable right IJ central line position. Persistent mild congestion/edema and bilateral basilar atelectasis or infiltrate.  IMPRESSION: Stable support apparatus.  Persistent mild congestion/edema. Again noted bilateral basilar atelectasis or infiltrate.  Original Report Authenticated By: Lahoma Crocker, M.D.   Dg Chest Port 1 View  02/29/2012  *RADIOLOGY REPORT*  Clinical Data: Assess endotracheal tube.  PORTABLE CHEST - 1 VIEW  Comparison: 02/28/2012.  Findings: Endotracheal tube is in satisfactory position. Nasogastric tube is followed into the stomach.  Right IJ central line tip projects over the SVC.  Heart is enlarged, stable.  Thoracic aorta is calcified.  Mild diffuse interstitial prominence and indistinctness with bibasilar air space disease.  Collapse/consolidation in the left lower lobe, as before.  Probable bilateral pleural effusions.  IMPRESSION: Probable congestive heart failure with bibasilar air space disease, left greater than right.  Original Report Authenticated By: Luretha Rued, M.D.   Dg Chest Port 1 View  02/28/2012  *RADIOLOGY REPORT*  Clinical Data: Endotracheal tube placement.  PORTABLE CHEST - 1 VIEW  Comparison: Chest x-ray 02/27/2012.  Findings: The endotracheal tube is 13 cm above the carina located just at the thoracic inlet.  The NG tube and right IJ catheters are stable.  The heart remains enlarged. Probable perihilar pulmonary edema and bibasilar atelectasis.  No definite pleural  effusions.  IMPRESSION:  1.  Stable support apparatus. 2.  Persistent cardiac enlargement, probable perihilar pulmonary edema and bibasilar atelectasis.  Original Report Authenticated By: P. Kalman Jewels, M.D.   Dg Chest Port 1 View  02/27/2012  *RADIOLOGY REPORT*  Clinical Data: Respiratory failure.  Evaluate endotracheal tube position.  PORTABLE CHEST - 1 VIEW  Comparison: Multiple priors, most recently 02/27/2012.  Findings: An endotracheal tube is in place with tip 10.1 cm above the carina. A nasogastric tube is seen extending into the stomach, however, the tip of the nasogastric tube extends below the lower margin of the image. There is a right-sided internal jugular central venous catheter with tip terminating in the proximal superior vena cava.Lung volumes are low.  There are bibasilar opacities which may represent areas of atelectasis and/or consolidation.  Costophrenic sulci are excluded from the lower margin of the imaged such that small effusions cannot be excluded, however, no larger pleural effusions are noted.  Mild pulmonary venous congestion (likely accentuated by low lung volumes), without frank pulmonary edema.  Heart size is mildly enlarged.  Mediastinal contours are unremarkable.  Atherosclerotic calcifications within the arch of the aorta.  IMPRESSION: 1.  Support apparatus, as above.  Please note the relatively high position of the endotracheal tube and consider advancement approximately 3- 4 cm for more optimal placement. 2.  Persistent bibasilar areas of atelectasis and/or consolidation. Sequelae of aspiration or developing bilateral lower lobe infection would be difficult to entirely exclude. 3.  Atherosclerosis. 4.  Mild cardiomegaly.  Original Report Authenticated By: Etheleen Mayhew, M.D.   Dg Chest Port 1 View  02/27/2012  *RADIOLOGY REPORT*  Clinical Data: Respiratory failure.  PORTABLE CHEST - 1 VIEW  Comparison: 02/26/2012  Findings: Interval extubation.  Cardiomegaly  bilateral perihilar and lower lobe opacities.  Mild interstitial prominence. Interstitial disease is improved slightly since prior study.  IMPRESSION: Improving interstitial edema.  Continued confluent opacities in the lower lobe, edema versus infection.  Original Report Authenticated By: Raelyn Number, M.D.   Dg Chest Port 1 View  02/26/2012  *RADIOLOGY REPORT*  Clinical Data: Check endotracheal tube placement.  PORTABLE CHEST - 1 VIEW  Comparison: 02/26/2012 at 0735 hours  Findings: Endotracheal tube is not visualized and may be above the thoracic inlet.  Enteric tube tip is below the left hemidiaphragm, likely in the stomach.  Right central venous catheter tip remains at the mid SVC region.  Cardiac enlargement with mild pulmonary vascular congestion and perihilar edema similar to previous study. No pleural effusions.  No pneumothorax.  IMPRESSION: Endotracheal tube  is not visualized and may be in the cervical trachea.  Persistent cardiac enlargement, mild pulmonary vascular congestion, and edema.  Original Report Authenticated By: Neale Burly, M.D.   Dg Chest Port 1 View  02/26/2012  *RADIOLOGY REPORT*  Clinical Data: Respiratory failure  PORTABLE CHEST - 1 VIEW  Comparison: Yesterday  Findings: Moderate cardiomegaly.  Endotracheal tube removed.  NG tube and right internal jugular vein central venous catheter stable. Stable diffuse edema.  No pneumothorax.  IMPRESSION: Extubated.  Stable edema.  Original Report Authenticated By: Jamas Lav, M.D.   Dg Chest Port 1 View  02/25/2012  *RADIOLOGY REPORT*  Clinical Data: 71 year old male on ventilator.  Recent cardiac arrest and ventricular fibrillation.  PORTABLE CHEST - 1 VIEW  Comparison: 02/24/2012 and prior chest radiographs  Findings: Cardiomegaly and bilateral airspace opacities/edema again noted. Endotracheal tube is again identified with tip 5.5 cm above the carina. A right IJ central venous catheters present with tip overlying the upper  SVC. An NG tube is identified entering the stomach with tip off the field of view. There is no evidence of pneumothorax. Left basilar atelectasis again noted.  IMPRESSION: Unchanged chest radiograph.  Original Report Authenticated By: Lura Em, M.D.   Dg Chest Port 1 View  02/24/2012  *RADIOLOGY REPORT*  Clinical Data: Copious secretions.  Endotracheal tube position.  PORTABLE CHEST - 1 VIEW  Comparison: Portable chest 02/23/2012.  Findings: Endotracheal tube now terminates 3.5 cm above the carina. An NG tube is in the stomach.  Right IJ line is stable.  The heart is mildly enlarged.  Aeration is slightly improved.  There is persistent bibasilar airspace disease.  Small pleural effusions are suspected.  IMPRESSION:  1.  Endotracheal tube is positioned somewhat lower than on the prior exam, now 3.5 cm above the carina. 2.  Slight improved aeration with persistent bilateral pleural effusions and basilar airspace disease, likely reflecting atelectasis.  Original Report Authenticated By: Resa Miner. MATTERN, M.D.   Portable Chest Xray In Am  02/23/2012  *RADIOLOGY REPORT*  Clinical Data: Ventilator.  PORTABLE CHEST - 1 VIEW  Comparison: 02/22/2012  Findings: Endotracheal tube remains 6 cm above the carina.  Support devices are unchanged.  Increasing bilateral perihilar and lower lobe opacities, likely worsening edema.  Cardiomegaly.  Suspect small layering effusions.  IMPRESSION: Worsening bilateral airspace disease, likely edema and probable small layering effusions.  Original Report Authenticated By: Raelyn Number, M.D.   Dg Chest Portable 1 View  02/23/2012  *RADIOLOGY REPORT*  Clinical Data: Cardiac arrest; central line placement and endotracheal tube placement.  PORTABLE CHEST - 1 VIEW  Comparison: Chest radiograph performed earlier today at 10:59 p.m.  Findings: The patient's endotracheal tube is now seen ending 5-6 cm above the carina.  An enteric tube is seen extending below the diaphragm.  A  right IJ line is noted ending overlying the mid SVC.  The lungs remain relatively well expanded.  There is worsening dense consolidation at the left lung base.  Vascular congestion is again noted, with increasing interstitial markings, raising concern for mild interstitial edema.  No definite pleural effusion or pneumothorax is seen.  The cardiomediastinal silhouette is borderline normal in size.  No acute osseous abnormalities are identified.  IMPRESSION:  1.  Endotracheal tube seen ending 5-6 cm above the carina. 2.  Right IJ line seen ending overlying the mid SVC. 3.  Worsening dense consolidation at the left lung base, likely reflect atelectasis. 4.  Mild interstitial edema appears slightly  worsened from the prior study.  Original Report Authenticated By: Santa Lighter, M.D.   Dg Chest Portable 1 View  02/22/2012  *RADIOLOGY REPORT*  Clinical Data: Cardiac arrest; assess support apparatus.  PORTABLE CHEST - 1 VIEW  Comparison: None.  Findings: The patient's enteric tube is noted extending below the diaphragm.  The endotracheal tube is seen ending 4 cm above the carina.  An esophageal probe is noted with its tip about the level of the vocal cords.  Vascular congestion is noted, with increased interstitial markings, likely reflecting mild pulmonary edema.  No definite pleural effusion or pneumothorax is seen, though the left costophrenic angle is incompletely imaged on this study.  The cardiomediastinal silhouette is borderline enlarged; calcification is noted within the aortic arch.  No acute osseous abnormalities are seen.  IMPRESSION:  1.  Endotracheal tube seen ending 4 cm above the carina. 2.  Esophageal probe noted with tip about the level of the vocal cords; this could be carefully advanced to the esophagus, as deemed clinically appropriate. 3.  Vascular congestion and borderline cardiomegaly, with increased interstitial markings, likely reflecting mild pulmonary edema.  Original Report Authenticated By:  Santa Lighter, M.D.    Medications: Scheduled Meds:   . antiseptic oral rinse  15 mL Mouth Rinse BID  . aspirin  81 mg Oral Daily  . diltiazem  60 mg Oral Q6H  . enoxaparin (LOVENOX) injection  1 mg/kg Subcutaneous Q12H  . metoprolol tartrate  50 mg Oral BID  . traZODone  100 mg Oral QHS  . warfarin  5 mg Oral ONCE-1800  . warfarin  5 mg Oral ONCE-1800  . Warfarin - Pharmacist Dosing Inpatient   Does not apply q1800   Continuous Infusions:    Assessment/Plan: Principal Problem:  *Cardiac arrest Active Problems:  Atrial fibrillation with rapid ventricular response  Acute respiratory failure with hypoxia  CKD (chronic kidney disease)  Ventricular fibrillation  DM (diabetes mellitus)  Cardiogenic shock  Acute renal failure  Anoxic encephalopathy  Ischemic cardiomyopathy  Septic shock  Acute systolic CHF (congestive heart failure)  Acute confusion/delerium  Delirium   Acute Delirium: Improving today, Multifactorial but no clear etiology, patient had cardiac arrest with possible anoxic encephalopathy, hypoxic respiratory failure, prolonged intubation, sepsis, prolonged hospitalization causing the acute psychosis.  - Per RN, still didn't sleep well last night, but no agitation today   - Patient was seen by psychiatry and started on PRN Cogentin and trazodone.  - avoid benzodiazepines or any other exacerbating medications.  - I had ordered the UA and culture, still pending  - LFTs improving, ammonia level normal   - Patient had CT head on 6/20 at the time of arrival which was essentially negative. Given the sudden change in his mental status in the last 24 hours, I had ordered the MRI brain and EEG for further workup (pending). - defer to primary service for CHF management and other cardiac issues.  - Chief Technology Officer probably for another 24 hours until he is safe from psychiatry standpoint   DVT Prophylaxis: On Coumadin, Lovenox  Code Status: Full  code  Disposition: Per primary service   LOS: 20 days   RAI,RIPUDEEP M.D. [facility] 03/13/2012, 1:46 PM Pager: [phone]  If 7PM-7AM, please contact night-coverage [email or web address] Password [email or web address]

## 2012-03-13 NOTE — Progress Notes (Signed)
Speech Language Pathology Treatment Patient Details Name: Logan White MRN: [id number] DOB: [birth year] Today's Date: 03/13/2012 Time: [id number] SLP Time Calculation (min): 13 min  Assessment / Plan / Recommendation Clinical Impression  Treatment focused on cognitive-skills development. Patient initially alert, interactive, pleasant. SLP provided moderate-max verbal and tactile cues to sustain attention to basic conversation as patient is highly distracted, both internally and externally. Patient oriented to self only. Unable to provide specific details regarding name of hospital, time, or situation. Max verbal cues required for improved intellectual awareness of deficits s/p cardiac arrest as patient sees no differences in overall function. Patient became quickly fatigued but remained pleasant, stating "I need to rest, Ill see you tomorrow morning". Wife present and supportive. Educated wife on need for 24 hour supervision following discharge due to cognitive impairements impacting overall safety with ADLs at this time. Will continue to f/u.     SLP Plan  Continue with current plan of care    Pertinent Vitals/Pain n/a  SLP Goals  SLP Goals Potential to Achieve Goals: Good Progress/Goals/Alternative treatment plan discussed with pt/caregiver and they: Agree SLP Goal #1: Patient will sustain attention to basic functional ADL with min assist. SLP Goal #1 - Progress: Progressing toward goal SLP Goal #2: Patient will verbalize awareness of new deficits s/p acute illness with moderate assist. SLP Goal #2 - Progress: Progressing toward goal  General Behavior/Cognition: Cooperative;Pleasant mood;Lethargic Patient Positioning: Upright in bed      Treatment Treatment focused on: Cognition;Patient/family/caregiver education Family/Caregiver Educated: wife Skilled Treatment: Treatment focused on cognitive-skills development. Patient initially alert, interactive, pleasant. SLP provided  moderate-max verbal and tactile cues to sustain attention to basic conversation as patient is highly distracted, both internally and externally. Patient oriented to self only. Unable to provide specific details regarding name of hospital, time, or situation. Max verbal cues required for improved intellectual awareness of deficits s/p cardiac arrest as patient sees no differences in overall function. Patient became quickly fatigued but remained pleasant, stating "I need to rest, Ill see you tomorrow morning". Wife present and supportive. Educated wife on need for 24 hour supervision following discharge due to cognitive impairements impacting overall safety with ADLs at this time. Will continue to f/u.  (continue to recommend CIR )   [place], CCC-SLP [phone]   Kura Bethards Meryl 03/13/2012, 11:15 AM

## 2012-03-13 NOTE — Progress Notes (Signed)
Nutrition Follow-up  Intervention:   1. Ensure Complete po BID, each supplement provides 350 kcal and 13 grams of protein. 2. RD will continue to follow    Assessment:   Pt transferred to floor. Increased agitation and restlessness beginning on 7/7, refusing meds and labs . Seen by psych. At time of RD visit pt was calm, with sitter in room. Sitter states pt at a good lunch. Pt reports he has had a good appetite today. After several questions pt started to mumble and drifted off to sleep.   Diet Order:  Heart PO intake: variable, 0-50% per documentation Supplements: none  Meds: Scheduled Meds:   . antiseptic oral rinse  15 mL Mouth Rinse BID  . aspirin  81 mg Oral Daily  . diltiazem  60 mg Oral Q6H  . enoxaparin (LOVENOX) injection  1 mg/kg Subcutaneous Q12H  . metoprolol tartrate  50 mg Oral BID  . traZODone  100 mg Oral QHS  . warfarin  5 mg Oral ONCE-1800  . warfarin  5 mg Oral ONCE-1800  . Warfarin - Pharmacist Dosing Inpatient   Does not apply q1800   Continuous Infusions:  PRN Meds:.acetaminophen (TYLENOL) oral liquid 160 mg/5 mL, albuterol, benztropine, guaiFENesin, haloperidol lactate, metoprolol, DISCONTD: guaifenesin  Labs:  CMP     Component Value Date/Time   NA 137 03/12/2012 0500   K 4.2 03/12/2012 0500   CL 99 03/12/2012 0500   CO2 24 03/12/2012 0500   GLUCOSE 89 03/12/2012 0500   BUN 23 03/12/2012 0500   CREATININE 1.66* 03/12/2012 0500   CALCIUM 9.2 03/12/2012 0500   PROT 7.6 03/12/2012 1810   ALBUMIN 2.8* 03/12/2012 1810   AST 35 03/12/2012 1810   ALT 34 03/12/2012 1810   ALKPHOS 161* 03/12/2012 1810   BILITOT 1.0 03/12/2012 1810   GFRNONAA 41* 03/12/2012 0500   GFRAA 47* 03/12/2012 0500     Intake/Output Summary (Last 24 hours) at 03/13/12 1504 Last data filed at 03/12/12 2246  Gross per 24 hour  Intake    120 ml  Output      0 ml  Net    120 ml    Weight Status:  164 lbs, trending down  Re-estimated needs:  1900-2100 kcal, 80-90 gm protein  Nutrition Dx:  Inadequate  oral intake r/t dislike of meals AEB limited intake, ongoing   Goal:  Pt to meet >/=90% of estimated nutrition needs, unmet  Monitor:  PO intake, weight, labs,  Orson Slick RD, LDN Pager [phone] After Hours pager [phone]

## 2012-03-13 NOTE — Progress Notes (Addendum)
ANTICOAGULATION CONSULT NOTE - Follow Up Consult  Pharmacy Consult for  Lovenox / Coumadin Indication: atrial fibrillation  No Known Allergies   Labs:  Basename 03/13/12 0520 03/12/12 0500 03/11/12 0858 03/11/12 0839  HGB -- -- -- 12.4*  HCT -- -- -- 35.9*  PLT -- -- -- 658*  APTT -- -- -- --  LABPROT 18.7* 18.6* 19.4* --  INR 1.53* 1.52* 1.61* --  HEPARINUNFRC -- -- -- --  CREATININE -- 1.66* -- 1.49*  CKTOTAL -- -- -- --  CKMB -- -- -- --  TROPONINI -- -- -- --    Estimated Creatinine Clearance: 44.9 ml/min (by C-G formula based on Cr of 1.66).  Assessment: Pt with Afib s/p Vfib arrest. D#2 of enoxaparin and warfarin overlap with an INR of 1.53  Goal of Therapy:  INR 2-3 Monitor platelets by anticoagulation protocol: Yes   Plan:  1) Continue Lovenox 70 mg sq BID 2) Coumadin 5 mg po x 1 dose today 3) Follow up CBC and INR  Thank you. Anette Guarneri, PharmD [phone]

## 2012-03-14 LAB — PROTIME-INR: INR: 1.48 (ref 0.00–1.49)

## 2012-03-14 LAB — CREATININE, SERUM
Creatinine, Ser: 1.68 mg/dL — ABNORMAL HIGH (ref 0.50–1.35)
GFR calc non Af Amer: 40 mL/min — ABNORMAL LOW (ref >90)

## 2012-03-14 MED ORDER — WARFARIN SODIUM 7.5 MG PO TABS
7.5000 mg | ORAL_TABLET | Freq: Once | ORAL | Status: AC
  Administered 2012-03-14: 7.5 mg via ORAL
  Filled 2012-03-14: qty 1
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
## 2012-03-14 NOTE — Progress Notes (Signed)
Occupational Therapy Treatment Patient Details Name: Logan White MRN: [id number] DOB: 30-Nov-1940 Today's Date: 03/14/2012 Time: [id number] OT Time Calculation (min): 18 min  OT Assessment / Plan / Recommendation Comments on Treatment Session Pt demonstrates 3 out 4 DOE however on RA pt oxgyen 92% at sink with oral care. Pt lacks awares of DOE. Pt remains high fall risk    Follow Up Recommendations  Inpatient Rehab    Barriers to Discharge       Equipment Recommendations  Defer to next venue    Recommendations for Other Services Rehab consult  Frequency Min 2X/week   Plan Discharge plan remains appropriate    Precautions / Restrictions Precautions Precautions: Fall Restrictions Weight Bearing Restrictions: No   Pertinent Vitals/Pain none    ADL  Grooming: Performed;Min guard;Wash/dry hands;Teeth care Where Assessed - Grooming: Unsupported standing (pt fixated on thrush located on tongue) Toilet Transfer: Simulated;Min guard Toilet Transfer Method: Sit to stand;Stand pivot Science writer: Regular height toilet (BIl ue used on bed surface to recommend 3n1) Equipment Used: Gait belt;Rolling walker Transfers/Ambulation Related to ADLs: PT ambulated ~10 ft during session with flexed posture at hips. Pt DOE of 3 out 4 however responses no dyspnea when asked ADL Comments: Pt reluctant initially to OT treatment but with some cueing agreeable. Pt at the end of session states "ill do whatever I can for ya" pt completed bed mobility and transfered to sink level for grooming. Pt with flexed posture (hip flexion, bil ue resting on counter top and dypnea 3 out 4) pt d/c oxgyen at sink level and oxgyen level 92% on RA. Pt required extended time due to rest break required. Pt demonstrates concern over white thrush like color on tongue.        OT Goals Acute Rehab OT Goals OT Goal Formulation: With patient Time For Goal Achievement: 03/23/12 Potential to Achieve Goals:  Good ADL Goals Pt Will Perform Grooming: with set-up;with supervision;Standing at sink ADL Goal: Grooming - Progress: Progressing toward goals Pt Will Perform Lower Body Bathing: with set-up;with supervision;Sit to stand from bed Pt Will Perform Lower Body Dressing: with set-up;with supervision;Sit to stand from chair Pt Will Transfer to Toilet: with supervision;Ambulation;with DME;Regular height toilet ADL Goal: Toilet Transfer - Progress: Progressing toward goals Additional ADL Goal #1: Pt. will demonstrate proper hand placement during transfers to increase safety ADL Goal: Additional Goal #1 - Progress: Progressing toward goals  Visit Information  Last OT Received On: 03/14/12 Assistance Needed: +1    Subjective Data      Prior Functioning       Cognition  Overall Cognitive Status: Impaired Area of Impairment: Safety/judgement;Awareness of deficits;Awareness of errors Arousal/Alertness: Awake/alert Orientation Level: Disoriented to;Situation (reports July but not the correct date "28th or 29th") Behavior During Session: Flat affect Safety/Judgement: Decreased awareness of safety precautions;Decreased safety judgement for tasks assessed Awareness of Errors: Assistance required to identify errors made    Mobility Bed Mobility Bed Mobility: Supine to Sit;Sit to Supine Supine to Sit: 5: Supervision;HOB elevated Sitting - Scoot to Edge of Bed: 5: Supervision Sit to Supine: 5: Supervision Transfers Sit to Stand: 4: Min guard;With upper extremity assist;From bed Stand to Sit: 4: Min guard;With upper extremity assist;To bed Details for Transfer Assistance: pt needed mod v/c to initiate task but able to complete sit <>stand with min guard (A) . Pt static standing fair balance.    Exercises    Balance Static Sitting Balance Static Sitting - Balance Support: Bilateral upper extremity supported;Feet supported  Static Sitting - Level of Assistance: 5: Stand by assistance Static  Sitting - Comment/# of Minutes: 3 Static Standing Balance Static Standing - Balance Support: Bilateral upper extremity supported;During functional activity Static Standing - Level of Assistance: 5: Stand by assistance  End of Session OT - End of Session Activity Tolerance: Patient tolerated treatment well Patient left: in bed;with call bell/phone within reach Nurse Communication: Mobility status  GO     Sharol Harness [facility] 03/14/2012, 2:34 PM Pager: [phone]

## 2012-03-14 NOTE — Progress Notes (Addendum)
Noted medical workup in progress. I will need to discuss with Dr. Naaman Plummer any further inpatient rehabilitation stay needs before planning final disposition of CIR vs SNF vs Home with home Health follow up. Please call with any questions. [phone]. I discussed with Dr. Naaman Plummer pt's update in functional status and medical workup. I then contacted pt's wife by phone. She feels she and their 70 year old son can provide 24/7 supervision and manage his long term cognition issues. She prefers CIR admit and then d/c home with 24/7 care of family. She is under the understanding that cardiology plans further studies Thursday or Friday related to ICD and then plans for inpt rehab. I am in agreement to CIR admit after medical workup completion. Please call for any questions. [phone]

## 2012-03-14 NOTE — Progress Notes (Signed)
ANTICOAGULATION CONSULT NOTE - Follow Up Consult  Pharmacy Consult for  Lovenox / Coumadin Indication: atrial fibrillation  No Known Allergies   Labs:  Basename 03/14/12 0500 03/13/12 0520 03/12/12 0500 03/11/12 0839  HGB -- -- -- 12.4*  HCT -- -- -- 35.9*  PLT -- -- -- 658*  APTT -- -- -- --  LABPROT 18.2* 18.7* 18.6* --  INR 1.48 1.53* 1.52* --  HEPARINUNFRC -- -- -- --  CREATININE 1.68* -- 1.66* 1.49*  CKTOTAL -- -- -- --  CKMB -- -- -- --  TROPONINI -- -- -- --    Estimated Creatinine Clearance: 44.3 ml/min (by C-G formula based on Cr of 1.68).  Assessment: Pt with Afib s/p Vfib arrest.  Enoxaparin and warfarin overlap with an INR of 1.48  Goal of Therapy:  INR 2-3 Monitor platelets by anticoagulation protocol: Yes   Plan:  1) Continue Lovenox 70 mg sq BID 2) Coumadin 7.5 mg po x 1 dose today 3) Follow up CBC and INR  Thank you. Anette Guarneri, PharmD [phone]

## 2012-03-14 NOTE — Progress Notes (Signed)
Follow up Psych Consult;  S/p cardiac arrest, slow recovery with sudden acute altered mental status of progressive agitation, sleepless night, aggressive combativeness; then complete inability to speak or interact with staff.  Pt is seen today while EKG is taken.  He is lying in bed, speaking, following simple directions.    He is missing upper teeth which impairs his speech.  But he engages in simple responses with  Good eye contact and appropriate replies.  He says he feels better today.  His ability to respond to more complex questions is limited. Today is the first day in several where his symptoms of delirium are abating and he is more cognitively intact.  He expresses an interest in going home.  RECOMMENDATION:  1.  Given his abnormal EKG with prolonged QTc interval and bradycardia s/p infarct, NO antipsychotics are indicated nor should they be taken.  2. Defer to cardiologists for discharge plan; suggest short stay at SNF for Pt/OT Logan Asberry J. Evern Bio, MD Psychiatrist . 03/14/2012 12:11 AM

## 2012-03-15 LAB — CBC
HCT: 31.4 % — ABNORMAL LOW (ref 39.0–52.0)
Platelets: 484 10*3/uL — ABNORMAL HIGH (ref 150–400)
RDW: 18.4 % — ABNORMAL HIGH (ref 11.5–15.5)
WBC: 7.9 10*3/uL (ref 4.0–10.5)

## 2012-03-15 LAB — URINE CULTURE: Colony Count: NO GROWTH

## 2012-03-15 LAB — BASIC METABOLIC PANEL
BUN: 19 mg/dL (ref 6–23)
Chloride: 96 mEq/L (ref 96–112)
GFR calc Af Amer: 52 mL/min — ABNORMAL LOW (ref >90)
Potassium: 4.4 mEq/L (ref 3.5–5.1)
Sodium: 133 mEq/L — ABNORMAL LOW (ref 135–145)

## 2012-03-15 LAB — PROTIME-INR: INR: 2.24 — ABNORMAL HIGH (ref 0.00–1.49)

## 2012-03-15 MED ORDER — WARFARIN SODIUM 5 MG PO TABS
5.0000 mg | ORAL_TABLET | Freq: Once | ORAL | Status: AC
  Administered 2012-03-15: 5 mg via ORAL
  Filled 2012-03-15: qty 1

## 2012-03-15 MED ORDER — METOPROLOL TARTRATE 25 MG PO TABS
25.0000 mg | ORAL_TABLET | Freq: Two times a day (BID) | ORAL | Status: DC
  Administered 2012-03-15 (×2): 25 mg via ORAL
  Filled 2012-03-15 (×4): qty 1

## 2012-03-15 NOTE — Progress Notes (Signed)
Speech Language Pathology Treatment Patient Details Name: Logan White MRN: [id number] DOB: 03-16-1941 Today's Date: 03/15/2012 Time: [id number] SLP Time Calculation (min): 8 min  Assessment / Plan / Recommendation Clinical Impression  Pt. seen for cognitve treatment with goal of increasing indepence and decreasing level of care required.  Pt. very sleepy needing total assist to awaken with verbal and tactile cues and sustain attention to SLP and eventually stated he didn't sleep much last night.  Throughout constant stimuli to staty awake he needed total assist for spatial, situational and temporal orientation.  No response to additional questions due to inability to sustain attention for more than 1 second    SLP Plan  Continue with current plan of care       SLP Goals  SLP Goals Potential to Achieve Goals: Good Potential Considerations: Cooperation/participation level SLP Goal #1 - Progress: Progressing toward goal SLP Goal #2 - Progress: Progressing toward goal SLP Goal #3 - Progress: Progressing toward goal  General Behavior/Cognition: Cooperative;Pleasant mood;Lethargic;Requires cueing;Decreased sustained attention Patient Positioning: Upright in bed  Oral Cavity - Oral Hygiene     Treatment Skilled Treatment: Pt. seen for cognitve treatment with goal of increasing indepence and decreasing level of care required.  Pt. very sleepy needing total assist to awaken with verbal and tactile cues and sustain attention to SLP and eventually stated he didn't sleep much last night.  Throughout constant stimuli to staty awake he needed total assist for spatial, situational and temporal orientation.  No response to additional questions due to inability to sustain attention for more than 1 second.         Orbie Pyo [place].[employer] [phone]  03/15/2012

## 2012-03-15 NOTE — Progress Notes (Signed)
Physical Therapy Treatment Patient Details Name: Logan White MRN: [id number] DOB: 12/21/1940 Today's Date: 03/15/2012 Time: [id number] PT Time Calculation (min): 25 min  PT Assessment / Plan / Recommendation Comments on Treatment Session  Pt making good progress with mobility should be appropriate for return to home.      Follow Up Recommendations  Home health PT    Barriers to Discharge        Equipment Recommendations  Rolling walker with 5" wheels    Recommendations for Other Services    Frequency Min 2X/week   Plan Discharge plan remains appropriate;Frequency remains appropriate    Precautions / Restrictions Precautions Precautions: Fall Restrictions Weight Bearing Restrictions: No   Pertinent Vitals/Pain No c/o pain    Mobility  Bed Mobility Bed Mobility: Supine to Sit;Sit to Supine Supine to Sit: 7: Independent Sitting - Scoot to Edge of Bed: 5: Supervision Sit to Supine: 7: Independent;HOB flat Transfers Transfers: Sit to Stand;Stand to Sit Sit to Stand: 6: Modified independent (Device/Increase time) Stand to Sit: 7: Independent Stand Pivot Transfers: Not tested (comment) Ambulation/Gait Ambulation/Gait Assistance: 4: Min guard;5: Supervision Ambulation Distance (Feet): 250 Feet Assistive device: Rolling walker;1 person hand held assist Ambulation/Gait Assistance Details: Pt ambulated 125 feet with RW and supervision via cueing for upright trunk posture. Pt then ambulated 125 with HHA and rail on wall.  Gait without  RW less steady with occasional scissoring.  Gait Pattern: Narrow base of support;Trunk flexed;Left flexed knee in stance;Right flexed knee in stance;Step-through pattern Stairs: No Wheelchair Mobility Wheelchair Mobility: No Modified Rankin (Stroke Patients Only) Pre-Morbid Rankin Score: Moderate disability Modified Rankin: No significant disability    Exercises     PT Diagnosis:    PT Problem List:   PT Treatment Interventions:     PT  Goals Acute Rehab PT Goals PT Goal Formulation: With patient Time For Goal Achievement: 03/20/12 Potential to Achieve Goals: Fair Pt will Roll Supine to Right Side: with supervision PT Goal: Rolling Supine to Right Side - Progress: Met Pt will go Supine/Side to Sit: with supervision;with HOB 0 degrees PT Goal: Supine/Side to Sit - Progress: Met Pt will go Sit to Supine/Side: with supervision;with HOB 0 degrees PT Goal: Sit to Supine/Side - Progress: Met Pt will go Sit to Stand: with supervision;with upper extremity assist PT Goal: Sit to Stand - Progress: Met Pt will go Stand to Sit: with supervision;with upper extremity assist PT Goal: Stand to Sit - Progress: Met Pt will Transfer Bed to Chair/Chair to Bed: with supervision PT Transfer Goal: Bed to Chair/Chair to Bed - Progress: Met Pt will Ambulate: 16 - 50 feet;with supervision;with least restrictive assistive device PT Goal: Ambulate - Progress: Met  Visit Information  Last PT Received On: 03/15/12    Subjective Data      Cognition  Overall Cognitive Status: Impaired Area of Impairment: Safety/judgement;Awareness of deficits;Awareness of errors Arousal/Alertness: Awake/alert Orientation Level: Disoriented to;Time;Situation Behavior During Session: [facility] for tasks performed Safety/Judgement: Decreased awareness of safety precautions;Decreased safety judgement for tasks assessed    Balance  Balance Balance Assessed: No  End of Session PT - End of Session Equipment Utilized During Treatment: Gait belt Activity Tolerance: Patient tolerated treatment well Patient left: in bed;with call bell/phone within reach Nurse Communication: Mobility status   GP     Cleopatra Sardo 03/15/2012, 2:26 PM Tiffannie Sloss L. Esau Fridman DPT [phone]

## 2012-03-15 NOTE — Progress Notes (Signed)
Pt scheduled dose of metoprolol held due to HR of 55 at this time.  MD has been made aware.  Dose has been adjusted.  Will continue to monitor. Gae Gallop RN
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
## 2012-03-15 NOTE — Progress Notes (Signed)
   CARE MANAGEMENT NOTE 03/15/2012  Patient:  Logan White, Logan White   Account Number:  [id number]  Date Initiated:  02/23/2012  Documentation initiated by:  SIMMONS,CRYSTAL  Subjective/Objective Assessment:   ADMITTED WITH CARDIAC ARREST. LIVES AT HOME WITH WIFE; WAS IPTA.     Action/Plan:   DISCHARGE PLANNING.   Anticipated DC Date:  03/16/2012   Anticipated DC Plan:  [place]  CM consult      PAC Choice  IP REHAB  DURABLE MEDICAL EQUIPMENT   Choice offered to / List presented to:     DME arranged  3-N-1  Vassie Moselle      DME agency  [place].        Status of service:  Completed, signed off Medicare Important Message given?   (If response is "NO", the following Medicare IM given date fields will be blank) Date Medicare IM given:   Date Additional Medicare IM given:    Discharge Disposition:  HOME/SELF CARE  Per UR Regulation:  Reviewed for med. necessity/level of care/duration of stay  If discussed at [place] of Stay Meetings, dates discussed:   02/29/2012  03/07/2012  03/13/2012  03/15/2012    Comments:  03/15/12 1130 Spoke with pt. and spouse about [place] services and DME.  Pt. will be going home with spouse when medially stable with Outpatient PT.  Pt. was interested in having rolling walker, and 3-N-1, however pt. is not interested in having [place] services.  Pt. has support from spouse and his son.  TC to [place] to make arrangements for DME. Llana Aliment, RN, BSN NCM [phone]   7/3 debbie dowell rn,bsn has been extubated, for cir consult.  7/1 13:30p debbie dowell rn,bsn remains on vent and on iv amiodarone.  6/27 13:46p debbie dowell rn,bsn remains on vent. now on iv amiodarone.  6/24 14:33 debbie dowell rn,bsn remains on levophed iv and vent.  02/23/12  [place], BSN [phone] NCM WILL FOLLOW.

## 2012-03-15 NOTE — Progress Notes (Addendum)
ANTICOAGULATION CONSULT NOTE - Follow Up Consult  Pharmacy Consult for  Lovenox / Coumadin Indication: atrial fibrillation  No Known Allergies   Labs:  Basename 03/15/12 0521 03/14/12 0500 03/13/12 0520  HGB 10.9* -- --  HCT 31.4* -- --  PLT 484* -- --  APTT -- -- --  LABPROT 25.2* 18.2* 18.7*  INR 2.24* 1.48 1.53*  HEPARINUNFRC -- -- --  CREATININE 1.54* 1.68* --  CKTOTAL -- -- --  CKMB -- -- --  TROPONINI -- -- --    Estimated Creatinine Clearance: 49 ml/min (by C-G formula based on Cr of 1.54).  Assessment: Pt with Afib s/p Vfib arrest.  Enoxaparin and warfarin overlap D#5 with an INR of 2.24. Recommend to continue overlap with therapeutic INR X 2 days. H&H decreased from last CBC but no bleeding noted.   Goal of Therapy:  INR 2-3 Monitor platelets by anticoagulation protocol: Yes   Plan:  1) Continue Lovenox 70 mg sq BID per MD D#5 2) Decrease coumadin to 5mg  X 1 3) Follow up INR in AM   [facility], Pharm.D. Clinical Pharmacist   Pager: [phone] Phone: [phone] 03/15/2012 9:31 AM  Co-signed by: Salome Arnt, PharmD, BCPS Pager # [phone] 03/15/2012 9:32 AM

## 2012-03-15 NOTE — Progress Notes (Signed)
Discussed with PT pt's progress today. Patient ambulated to nurses' desk with RW and min guard assist. Returned to room with min guard assist and holding to hall rail. Wife reports going with him with RW and to bathroom over the last few days. Wife feels she can manage his cognition issues. I discussed again with Dr. Naaman Plummer and we recommend outpatient PT, OT, and SLP at this time. Wife is in agreement and requests outpatient neurorehabilitaiton that she took her brother to last year on [place]. I will update RNCM. Please call with any questions. [id number]

## 2012-03-15 NOTE — Progress Notes (Signed)
Patient Name: Logan White      SUBJECTIVE: appreciate help from consultants ;  EF normal with LAE  Past Medical History  Diagnosis Date  . Coronary artery disease   . Hypertension   . Diabetes mellitus   . Pneumonia   . Myocardial infarction   . Carotid artery stenosis     PHYSICAL EXAM Filed Vitals:   03/14/12 1410 03/14/12 2100 03/14/12 2200 03/15/12 0422  BP: 115/65 134/72  139/77  Pulse: 58 52 60 51  Temp: 97.8 F (36.6 C) 98.4 F (36.9 C)  98.2 F (36.8 C)  TempSrc: Oral Oral  Oral  Resp: 18 18  18   Height:      Weight:    166 lb 6.4 oz (75.479 kg)  SpO2: 97% 97%  96%    General appearance: alert, cooperative and no distress Lungs: clear to auscultation bilaterally Heart: regular rate and rhythm, S1, S2 normal, no murmur, click, rub or gallop Extremities: extremities normal, atraumatic, no cyanosis or edema Skin: Skin color, texture, turgor normal. No rashes or lesions Neurologic: Alert and oriented X 2   weak  TELEMETRY: Reviewed telemetry pt in sinus brady:    Intake/Output Summary (Last 24 hours) at 03/15/12 0806 Last data filed at 03/15/12 0600  Gross per 24 hour  Intake    600 ml  Output    900 ml  Net   -300 ml    LABS: Basic Metabolic Panel:  Lab [id number] 0521 03/14/12 0500 03/12/12 0500 03/11/12 0839 03/10/12 0500 03/09/12 0530  NA 133* -- 137 136 135 141  K 4.4 -- 4.2 5.8* 4.7 3.3*  CL 96 -- 99 98 101 101  CO2 24 -- 24 23 19 28   GLUCOSE 102* -- 89 91 112* 85  BUN 19 -- 23 22 19 20   CREATININE 1.54* 1.68* 1.66* 1.49* 1.25 1.30  CALCIUM 8.8 -- 9.2 -- -- --  MG -- -- -- -- 1.9 --  PHOS -- -- -- -- -- --   Cardiac Enzymes: No results found for this basename: CKTOTAL:3,CKMB:3,CKMBINDEX:3,TROPONINI:3 in the last 72 hours CBC:  Lab 03/15/12 0521 03/11/12 0839 03/09/12 0530  WBC 7.9 14.9* 11.5*  NEUTROABS -- -- --  HGB 10.9* 12.4* 11.6*  HCT 31.4* 35.9* 33.6*  MCV 73.7* 74.0* 72.7*  PLT 484* 658* 547*   PROTIME:  Basename  03/15/12 0521 03/14/12 0500 03/13/12 0520  LABPROT 25.2* 18.2* 18.7*  INR 2.24* 1.48 1.53*   Liver Function Tests:  Basename 03/12/12 1810  AST 35  ALT 34  ALKPHOS 161*  BILITOT 1.0  PROT 7.6  ALBUMIN 2.8*      ASSESSMENT AND PLAN:  Patient Active Hospital Problem List: Cardiac arrest (02/23/2012)   Atrial fibrillation with rapid ventricular response (02/23/2012)   CKD (chronic kidney disease) (02/23/2012)   Ventricular fibrillation (02/23/2012)   DM (diabetes mellitus) (02/23/2012)   Anoxic encephalopathy (02/25/2012)   Ischemic cardiomyopathy (02/25/2012)>> prior PCI with modest depression of OV function now improved  EF 45>>55%; neg myoview 5/13  0000000)   Acute systolic CHF (congestive heart failure) (03/11/2012)   Acute confusion/delerium (03/12/2012)   Afib  Now in sinus, on Coumadin with therapeutic INR  His K was profoundly low on admission and remained so for  3 days in hospital despite vigoours efforts to replete , suggesting a total body deficit as opposed to an electrolyte shift related to resuscitation.  Furthermore we add evidence in March of profound hypolakemia as outpt.  As such, n I think  it is reasonable, esp with normal LV function, to attribute to the reversible factor, hypokalemia is cardiac arrest and as such, will plan to discharge on medical therapy   BB/ ccomadin  willl shoot for tomoorow PT cponsult  Signed, Virl Axe MD  03/15/2012

## 2012-03-16 ENCOUNTER — Encounter (HOSPITAL_COMMUNITY): Payer: Self-pay | Admitting: Physician Assistant

## 2012-03-16 ENCOUNTER — Telehealth: Payer: Self-pay | Admitting: Cardiovascular Disease

## 2012-03-16 ENCOUNTER — Other Ambulatory Visit: Payer: Medicare Other

## 2012-03-16 LAB — BASIC METABOLIC PANEL
CO2: 28 mEq/L (ref 19–32)
Chloride: 95 mEq/L — ABNORMAL LOW (ref 96–112)
GFR calc Af Amer: 48 mL/min — ABNORMAL LOW (ref >90)
Potassium: 4.4 mEq/L (ref 3.5–5.1)
Sodium: 135 mEq/L (ref 135–145)

## 2012-03-16 LAB — PROTIME-INR
INR: 2.57 — ABNORMAL HIGH (ref 0.00–1.49)
Prothrombin Time: 28 seconds — ABNORMAL HIGH (ref 11.6–15.2)

## 2012-03-16 LAB — CBC
Platelets: 512 10*3/uL — ABNORMAL HIGH (ref 150–400)
RBC: 4.31 MIL/uL (ref 4.22–5.81)
WBC: 8.1 10*3/uL (ref 4.0–10.5)

## 2012-03-16 MED ORDER — WARFARIN SODIUM 5 MG PO TABS: 2.5000 mg | ORAL_TABLET | ORAL | Status: DC

## 2012-03-16 MED ORDER — SPIRONOLACTONE 25 MG PO TABS: 25.0000 mg | ORAL_TABLET | Freq: Every day | ORAL | Status: DC

## 2012-03-16 MED ORDER — NITROGLYCERIN 0.4 MG SL SUBL: 0.4000 mg | SUBLINGUAL_TABLET | SUBLINGUAL | Status: DC | PRN

## 2012-03-16 MED ORDER — ASPIRIN 81 MG PO TBEC: 81.0000 mg | DELAYED_RELEASE_TABLET | Freq: Every day | ORAL | Status: DC

## 2012-03-16 MED ORDER — WARFARIN SODIUM 2.5 MG PO TABS
2.5000 mg | ORAL_TABLET | Freq: Once | ORAL | Status: DC
  Filled 2012-03-16: qty 1

## 2012-03-16 NOTE — Progress Notes (Signed)
[place]   Agencies that are Medicare-Certified and are affiliated with The [employer]  Telephone Number Address  [place] has ownership interest in this company; however, you are under no obligation to use this agency. [phone] or  [place], [place] [address]   Agencies that are Medicare-Certified and are not affiliated with The [place] Telephone Number Address  [facility] [phone] Fax [phone] [address], [place], [place]  [address]  [facility] [phone] or [phone] Fax [phone] [id number] [place], [place] [address]  [facility] [phone] Fax [phone] [place], [place] [address]  [place] [phone] Fax [phone], [place], [place]  [address]  [facility] [phone] Fax [phone] [address] [place], [place] [address]  [facility] [place], [place] [address]  [facility] [phone]  [address], [place] [address]  [facility] [phone] or [phone] Fax [phone] [place] [address], [place] [address] [place], [place]  [address]  [facility] [phone] Fax [phone] [place], [place]  [address]  [place]  [phone] Fax [phone] [address] [place], [place] [address]               Agencies that are not Medicare-Certified and are not affiliated with The [place] Telephone Number Address  [place] or [phone] Fax [phone] [address], [place]  [address]  [facility] [phone] Fax [phone] [address] [place], [place]  [address]  [facility]  [phone] Fax [phone] [address], [place] [address]  [place] [facility] [place] [facility] [phone] Fax [phone] [address] [place], [place] [address]  [facility] [phone] or [phone] Fax [phone] [address] [place], [place]  [address]  [facility] [place] [phone] or [phone] Fax [phone] [address] [place], [place]  [address]  [facility] [phone] Fax [phone] [phone] [place], [place]  [address]  [facility] [phone] [address] [place], [place]  [address]  [facility] [phone] Fax [phone] [address] [place], [place]  [address]  [place]. [phone] Fax [phone] [address] [place], [place]  [address]  [facility] [phone] Fax [phone] [address] [place], [place] [address]  [facility] [phone] Fax [phone] [address] [place], [place]  [address]

## 2012-03-16 NOTE — Telephone Encounter (Signed)
Patient aware of appointment and is feeling good.  He does not have any questions or concerns at this time

## 2012-03-16 NOTE — Progress Notes (Signed)
   CARE MANAGEMENT NOTE 03/16/2012  Patient:  Logan White, Logan White   Account Number:  [id number]  Date Initiated:  02/23/2012  Documentation initiated by:  SIMMONS,CRYSTAL  Subjective/Objective Assessment:   ADMITTED WITH CARDIAC ARREST. LIVES AT HOME WITH WIFE; WAS IPTA.     Action/Plan:   DISCHARGE PLANNING.   Anticipated DC Date:  03/16/2012   Anticipated DC Plan:  [place]  CM consult      PAC Choice  IP REHAB  [place]   Choice offered to / List presented to:  C-3 Spouse   DME arranged  3-N-1  Vassie Moselle      DME agency  [place] arranged  [place] RN      [place].   Status of service:  Completed, signed off Medicare Important Message given?   (If response is "NO", the following Medicare IM given date fields will be blank) Date Medicare IM given:   Date Additional Medicare IM given:    Discharge Disposition:  HOME/SELF CARE  Per UR Regulation:  Reviewed for med. necessity/level of care/duration of stay  If discussed at [place] of Stay Meetings, dates discussed:   02/29/2012  03/07/2012  03/13/2012  03/15/2012    Comments:  03/16/12 1600 Llana Aliment, RN, BSN NCM [phone] TC to spouse about [place] and pt. changed his mind and he wants HH.  [place].  TC to [place] Pons, with [facility] to give referral for [facility] PT/OT, [facility] RN. Pt. discharged home today.   03/15/12 1130 Spoke with pt. and spouse about [place] services and DME.  Pt. will be going home with spouse when medially stable with Outpatient PT.  Pt. was interested in having rolling walker, and 3-N-1, however pt. is not interested in having [place] services.  Pt. has support from spouse and his son.  TC to [place] to make arrangements for DME. Llana Aliment, RN, BSN NCM [phone]   7/3 debbie dowell rn,bsn has been extubated, for cir consult.  7/1 13:30p  debbie dowell rn,bsn remains on vent and on iv amiodarone.  6/27 13:46p debbie dowell rn,bsn remains on vent. now on iv amiodarone.  6/24 14:33 debbie dowell rn,bsn remains on levophed iv and vent.  02/23/12  [place], BSN [phone] NCM WILL FOLLOW.
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
## 2012-03-16 NOTE — Telephone Encounter (Signed)
New problem:  Transition care -7 days pt has appt on  7/22 @ 12:10 with Brynda Rim.

## 2012-03-16 NOTE — Telephone Encounter (Signed)
lmom for patient to return my call 
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
## 2012-03-16 NOTE — Progress Notes (Signed)
IV d/c'd.  Tele d/c'd.  Pt d/c'd to home.  Home meds and d/c instructions have been discussed and reviewed with pt.  Pt leaving unit via wheelchair and appears in no acute distress. Gae Gallop RN

## 2012-03-16 NOTE — Progress Notes (Signed)
ANTICOAGULATION CONSULT NOTE - Follow Up Consult  Pharmacy Consult for  Lovenox / Coumadin Indication: atrial fibrillation  No Known Allergies   Labs:  Basename 03/16/12 0544 03/15/12 0521 03/14/12 0500  HGB 11.2* 10.9* --  HCT 31.9* 31.4* --  PLT 512* 484* --  APTT -- -- --  LABPROT 28.0* 25.2* 18.2*  INR 2.57* 2.24* 1.48  HEPARINUNFRC -- -- --  CREATININE 1.65* 1.54* 1.68*  CKTOTAL -- -- --  CKMB -- -- --  TROPONINI -- -- --    Estimated Creatinine Clearance: 45.8 ml/min (by C-G formula based on Cr of 1.65).  Assessment: Pt with Afib s/p Vfib arrest.   INR = 2.57, therapeutic No bleeding noted  Goal of Therapy:  INR 2-3 Monitor platelets by anticoagulation protocol: Yes   Plan:  1) Discontinue Lovenox 2) Coumadin 2.5 mg po x 1 dose today 3) Follow up AM INR  Thank you. Anette Guarneri, PharmD [phone]

## 2012-03-19 ENCOUNTER — Ambulatory Visit (INDEPENDENT_AMBULATORY_CARE_PROVIDER_SITE_OTHER): Payer: Medicare Other | Admitting: *Deleted

## 2012-03-19 ENCOUNTER — Ambulatory Visit (INDEPENDENT_AMBULATORY_CARE_PROVIDER_SITE_OTHER): Payer: Medicare Other

## 2012-03-19 DIAGNOSIS — E785 Hyperlipidemia, unspecified: Secondary | ICD-10-CM

## 2012-03-19 DIAGNOSIS — I10 Essential (primary) hypertension: Secondary | ICD-10-CM

## 2012-03-19 DIAGNOSIS — Z7901 Long term (current) use of anticoagulants: Secondary | ICD-10-CM

## 2012-03-19 DIAGNOSIS — I469 Cardiac arrest, cause unspecified: Secondary | ICD-10-CM

## 2012-03-19 DIAGNOSIS — I4891 Unspecified atrial fibrillation: Secondary | ICD-10-CM

## 2012-03-19 DIAGNOSIS — I2510 Atherosclerotic heart disease of native coronary artery without angina pectoris: Secondary | ICD-10-CM

## 2012-03-19 LAB — BASIC METABOLIC PANEL
Calcium: 9 mg/dL (ref 8.4–10.5)
GFR: 57.47 mL/min — ABNORMAL LOW (ref >60.00)
Glucose, Bld: 93 mg/dL (ref 70–99)
Potassium: 4.1 mEq/L (ref 3.5–5.1)
Sodium: 136 mEq/L (ref 135–145)

## 2012-03-19 NOTE — Patient Instructions (Signed)

## 2012-03-20 ENCOUNTER — Telehealth: Payer: Self-pay | Admitting: Cardiology

## 2012-03-20 NOTE — Telephone Encounter (Signed)
Recording [employer] customer still unavailable

## 2012-03-20 NOTE — Telephone Encounter (Signed)
Tried to call and cell number unavailable at this time, will try again later

## 2012-03-20 NOTE — Telephone Encounter (Signed)
New msg Pt's wife wants to discuss which meds to continue since getting out of hospital

## 2012-03-21 NOTE — Telephone Encounter (Signed)
Left message to call back  

## 2012-03-22 ENCOUNTER — Telehealth: Payer: Self-pay | Admitting: Cardiology

## 2012-03-22 NOTE — Telephone Encounter (Signed)
Pt was d/c from hospital and wants to know what meds to be taking now

## 2012-03-22 NOTE — Telephone Encounter (Addendum)
Family wants to know if he is ok on current meds from DC, pt on coumadin, asa and spironolactone. Home health nurse told family his vitals are good. His appetite is picking up. He is getting out of house. He has f/u with Kathleen Argue PA next week/ coumadin clinic also. Reviewed Dr Aquilla Hacker notes from hospital and other meds will be addressed in out setting, he is on current meds as stated. Family made aware.

## 2012-03-23 NOTE — Telephone Encounter (Signed)
Pt was informed meds are appropriate at this time.

## 2012-03-23 NOTE — Telephone Encounter (Signed)
His medications are appropriate. I have reviewed his d/c summary. His K+ on recent labs 7/15 was normal. He is not on zocor b/c of high liver enzymes.  This will likely be restarted in the future. He is off of diltiazem b/c of low heart rates. He is not on Lasix or K+ b/c of stable volume. He should continue current Rx until follow up with me. Richardson Dopp, PA-C  1:36 PM 03/23/2012

## 2012-03-26 ENCOUNTER — Ambulatory Visit (INDEPENDENT_AMBULATORY_CARE_PROVIDER_SITE_OTHER): Payer: Medicare Other | Admitting: Physician Assistant

## 2012-03-26 ENCOUNTER — Ambulatory Visit (INDEPENDENT_AMBULATORY_CARE_PROVIDER_SITE_OTHER): Payer: Medicare Other

## 2012-03-26 ENCOUNTER — Encounter: Payer: Self-pay | Admitting: Physician Assistant

## 2012-03-26 VITALS — BP 148/94 | HR 95 | Ht 75.5 in | Wt 163.4 lb

## 2012-03-26 DIAGNOSIS — I10 Essential (primary) hypertension: Secondary | ICD-10-CM

## 2012-03-26 DIAGNOSIS — I4891 Unspecified atrial fibrillation: Secondary | ICD-10-CM

## 2012-03-26 DIAGNOSIS — Z8719 Personal history of other diseases of the digestive system: Secondary | ICD-10-CM

## 2012-03-26 DIAGNOSIS — E785 Hyperlipidemia, unspecified: Secondary | ICD-10-CM

## 2012-03-26 DIAGNOSIS — Z7901 Long term (current) use of anticoagulants: Secondary | ICD-10-CM

## 2012-03-26 DIAGNOSIS — I469 Cardiac arrest, cause unspecified: Secondary | ICD-10-CM

## 2012-03-26 DIAGNOSIS — N189 Chronic kidney disease, unspecified: Secondary | ICD-10-CM

## 2012-03-26 DIAGNOSIS — I2510 Atherosclerotic heart disease of native coronary artery without angina pectoris: Secondary | ICD-10-CM

## 2012-03-26 LAB — PROTIME-INR
INR: 9.5 ratio (ref 0.8–1.0)
Prothrombin Time: 107 s (ref 10.2–12.4)

## 2012-03-26 LAB — BASIC METABOLIC PANEL
BUN: 20 mg/dL (ref 6–23)
Chloride: 103 mEq/L (ref 96–112)
Creatinine, Ser: 1.5 mg/dL (ref 0.4–1.5)
GFR: 59.69 mL/min — ABNORMAL LOW (ref >60.00)
Potassium: 4.1 mEq/L (ref 3.5–5.1)

## 2012-03-26 LAB — HEPATIC FUNCTION PANEL
Albumin: 3.7 g/dL (ref 3.5–5.2)
Alkaline Phosphatase: 152 U/L — ABNORMAL HIGH (ref 39–117)
Bilirubin, Direct: 0.2 mg/dL (ref 0.0–0.3)
Total Protein: 8.3 g/dL (ref 6.0–8.3)

## 2012-03-26 LAB — POCT INR: INR: >8

## 2012-03-26 NOTE — Patient Instructions (Addendum)
Your physician has recommended that you wear a 48 hour holter monitor. Holter monitors are medical devices that record the heart's electrical activity. Doctors most often use these monitors to diagnose arrhythmias. Arrhythmias are problems with the speed or rhythm of the heartbeat. The monitor is a small, portable device. You can wear one while you do your normal daily activities. This is usually used to diagnose what is causing palpitations/syncope (passing out).   Your physician recommends that you have blood work done today  Your physician recommends that you schedule a follow-up appointment in: 2-3 weeks with Dr. [name]

## 2012-03-26 NOTE — Progress Notes (Signed)
[place], [place]  [address] Phone: [phone] Fax:  [phone]  Date:  03/26/2012   Name:  Logan White   DOB:  1941-01-11   MRN:  [id number]  PCP:  No primary provider on file.  Primary Cardiologist:  Dr. Kirk Ruths  Primary Electrophysiologist:  Dr. Virl Axe    History of Present Illness: Logan White is a 71 y.o. male who returns for post hospital follow up.  He has a history of CAD, s/p PCI of his LAD in 06/2003.  He also has cerebrovascular disease.  Carotid Doppler performed in 5/12 with 40-59% bilateral stenosis.  Followup was recommended in 12 months.  Admitted to [facility] 12/2010 with N/V and hematemesis in the setting of new onset AFib with RVR.  Coumadin was deferred initially due to hematemesis.  Last seen by Dr. [name] 12/2011.  Myoview was scheduled at that time.  Myoview 01/09/12: Distal inferoseptal and small apical defect consistent with scar and/or soft tissue attenuation with minimal peri-infarct ischemia, low risk scan, not gated.    He was admitted 6/19-7/12 with a V. Fib arrest ultimately felt to be secondary to hypokalemia.  He witnessed a violent event prior to collapsing.  CPR and defibrillation were initiated in the field.  His potassium was low (2.2) upon arrival with renal insufficiency (creatinine 1.8).  He was in atrial fibrillation with RVR.   He was intubated.  Initial echocardiogram demonstrated reduced LV function with an EF of 25-30%.   LFTs were elevated and felt to be related to shocked liver.  He was treated with antibiotics for tracheobronchitis.  BAL was done and positive for Pseudomonas.  He was placed on IV amiodarone due to recurrent atrial fibrillation.  EEG was notable for moderate encephalopathy of nonspecific etiology.  Head CT was negative.  Repeat echo 03/09/12 (with improved LV function): EF 50-55%, mild MR, severe LAE, mildly reduced RVF, PASP 38, trivial pericardial effusion.  He was followed by  Dr. Caryl Comes in the hospital.  Amiodarone was discontinued.  He developed bradycardia and Cardizem was also discontinued.  He has a high thromboembolic risk factor profile and Coumadin was initiated.  There was a question of whether or not to proceed with cardiac catheterization.  The patient declined this in the setting of encephalopathy and this was placed on hold.  Due to the patient's delirium from an anoxic encephalopathy, he was seen by psychiatry.  He was given Cogentin and trazodone.  However, due to QTc prolongation his medications were stopped.  He was not placed back on his statin do to elevated LFTs.  Overall, it was felt that his arrest was due to reversible causes (hypokalemia) and EF recovered.  Therefore,  no ICD was indicated.  Dr. Caryl Comes noted he may require pacing in the future for tachy-brady.    Overall, doing well since discharge.  Denies chest pain, syncope, palpitations.  He denies significant dyspnea.  He probably describes class II symptoms.  He denies orthopnea or pedal edema.  Wt Readings from Last 3 Encounters:  03/26/12 163 lb 6.4 oz (74.118 kg)  03/15/12 166 lb 6.4 oz (75.479 kg)  03/15/12 166 lb 6.4 oz (75.479 kg)     Potassium  Date/Time Value Range Status  03/19/2012 11:30 AM 4.1  3.5 - 5.1 mEq/L Final     Creatinine, Ser  Date/Time Value Range Status  03/19/2012 11:30 AM 1.6* 0.4 - 1.5 mg/dL Final     Past Medical History  Diagnosis Date  . Inguinal hernia without mention of obstruction or gangrene, unilateral or unspecified, (not specified as recurrent)   . 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 Peripheral vascular disease, unspecified   . Carotid stenosis     dopplers 1/11: 60-79% bilat.  . Hyperlipidemia     mixed  . Hypertension     a. echo 4/12: EF 50%, asymmetric septal hypertrophy, no SAM or LVOT gradient, LAE, PASP 35  . Coronary artery disease     a. s/p aborted ant STEMI tx with Cypher DES to LAD 10/04  (residual at cath: D1 50%, CFX 40% and multiple dist 70%, EF 55%);   b. myoview 3/10: Ef 47%, infero-apical isch, LOW RISK - med Tx recommended  . CKD (chronic kidney disease)   . Paroxysmal atrial fibrillation 12/2010  . Diverticulosis     2001  . Diabetes mellitus   . H/O: CVA (cardiovascular accident)   . Coronary artery disease     a. s/p aborted ant STEMI s/p DES to LAD 06/2003.  Marland Kitchen Hypertension   . Glucose intolerance (impaired glucose tolerance)   . Pneumonia   . Carotid artery stenosis     01/2011 - 40-59% bilateral stenosis  . Peripheral vascular disease     h/o LE angioplasty  . 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 Cardiomyopathy     a. EF 50-55% by echo 03/09/12 (was 30% by echo 02/24/12)  . Chronic renal insufficiency   . Paroxysmal atrial fibrillation   . Mitral regurgitation     Mild by echo 03/2012  . Hematemesis     12/2010 felt 2/2 Mallory Weiss tear - pt could not afford colonscopy/EGD at that time so Coumadin was deferred. Coumadin initiated 03/2012 without any evidence for bleeding.  Marland Kitchen Hyperlipidemia   . Inguinal hernia   . Diverticulosis 2001  . QT prolongation   . Bradycardia     Requiring discontinuation of use of BB/CCB  . Elevated LFTs     Shock liver 03/2012    Current Outpatient Prescriptions  Medication Sig Dispense Refill  . albuterol (PROVENTIL HFA;VENTOLIN HFA) 108 (90 BASE) MCG/ACT inhaler Inhale 2 puffs into the lungs every 4 (four) hours as needed. For wheezing.      Marland Kitchen aspirin 81 MG EC tablet Take 81 mg by mouth daily.        Marland Kitchen aspirin 81 MG EC tablet Take 1 tablet (81 mg total) by mouth daily.      Marland Kitchen diltiazem (CARDIZEM CD) 180 MG 24 hr capsule Take 1 capsule (180 mg total) by mouth daily.  30 capsule  6  . furosemide (LASIX) 40 MG tablet Take 1 tablet (40 mg total) by mouth daily.   30 tablet  11  . nitroGLYCERIN (NITROSTAT) 0.4 MG SL tablet Place 1 tablet (0.4 mg total) under the tongue every 5 (five) minutes as needed for chest pain (Up to 3 doses).  25 tablet  4  . potassium chloride SA (K-DUR,KLOR-CON) 20 MEQ tablet Take 1 tablet (20 mEq total) by mouth 3 (three) times daily.  90 tablet  5  . simvastatin (ZOCOR) 40 MG tablet Take 1 tablet (40 mg total) by mouth every evening.  30 tablet  11  . spironolactone (ALDACTONE) 25 MG tablet Take 1 tablet (25 mg total) by mouth daily.  30 tablet  6  . warfarin (COUMADIN) 5 MG tablet Take 0.5-1 tablets (2.5-5 mg total) by mouth as directed. TONIGHT (03/16/12) - take a half tablet by mouth. TOMORROW (03/17/12) - take a whole tablet by mouth. Sunday (03/18/12) - take a whole tablet by mouth. Monday (03/19/12) - you will have an appointment to get your Coumadin level checked at [facility] and they will tell you what your dose should be.  30 tablet  2  . DISCONTD: famotidine (PEPCID) 20 MG tablet Take 1 tablet (20 mg total) by mouth 2 (two) times daily.  60 tablet  11  . DISCONTD: hydrALAZINE (APRESOLINE) 50 MG tablet Take 50 mg by mouth 3 (three) times daily.          Allergies: No Known Allergies  History  Substance Use Topics  . Smoking status: Current Everyday Smoker -- 0.5 packs/day for 30 years    Types: Cigarettes    Last Attempt to Quit: 09/08/2010  . Smokeless tobacco: Not on file  . Alcohol Use: No     ROS:  Please see the history of present illness.    All other systems reviewed and negative.   PHYSICAL EXAM: VS:  BP 148/94  Pulse 95  Ht 6' 3.5" (1.918 m)  Wt 163 lb 6.4 oz (74.118 kg)  BMI 20.15 kg/m2 Well nourished, well developed, in no acute distress HEENT: normal Neck: no JVD Cardiac:  normal S1, S2; Irregularly irregular rhythm, no murmur Lungs:  clear to auscultation bilaterally, no wheezing, rhonchi or rales Abd: soft, nontender, no hepatomegaly Ext: no edema Skin: warm and dry Neuro:  CNs 2-12  intact, no focal abnormalities noted  EKG:  H. The fibrillation, rate 95, normal axis, LVH with repolarization abnormality      ASSESSMENT AND PLAN:  1.  Atrial fibrillation Overall, his rate seems to be fairly well controlled.  He had difficulty with significant bradycardia with AV nodal blocking agents in the hospital.  Dr. Caryl Comes had noted that he may be a candidate for pacemaker due to tachybradycardia syndrome in the future.  At this point, I am hesitant to add his diltiazem or even low-dose Toprol back given his bradycardia in the hospital.  Today, his heart rate is 95, which is fairly well controlled.  I will place him on a 48-hour Holter to better assess his heart rate control.  He is on Coumadin which is managed in our Coumadin clinic.  His INR was elevated today and he needs a venipuncture for INR.  Plan follow up with Dr. [name] in the next several weeks.  2.  Ventricular fibrillation arrest in the setting of profound hypokalemia He had reduced LV function when he initially presented.  Follow up echo prior to discharge demonstrated normalized LV function.  There was a question whether or not to proceed with cardiac catheterization, however this was deferred due to the patient's mental status.  He had a recent low risk Myoview.  He is now on spironolactone.  I will check a basic metabolic panel today.  I will defer decisions regarding whether or not to proceed with cardiac catheterization to Dr. [name].  As noted above, follow up with Dr. [name] in the next several weeks.    3.  CAD Stable.  Continue aspirin.  Follow up as noted.  4.  Chronic kidney disease Check a basic metabolic panel today.  5.  Hyperlipidemia Check LFTs today.  Once normalized, restart statin therapy.  6.  History of upper GI bleed Overall tolerating  Coumadin.  7.  Hypertension Somewhat elevated.  Continue current therapy.  Continue to monitor blood pressure.  8.  CHF As noted above, his ejection  fraction was initially depressed and then recovered.  His volume appears stable without Lasix therapy.  We discussed the importance of obtaining a scale and wane daily.  He plans on doing this.  He knows to call us if he gains 2-3 pounds in 24 hours.   Signed, Richardson Dopp, PA-C  11:50 AM 03/26/2012

## 2012-03-28 ENCOUNTER — Telehealth: Payer: Self-pay | Admitting: *Deleted

## 2012-03-28 DIAGNOSIS — R945 Abnormal results of liver function studies: Secondary | ICD-10-CM

## 2012-03-28 NOTE — Procedures (Signed)
REFERRING PHYSICIAN:  Estill Cotta, MD  HISTORY:  A 71 year old male, extubated status post cardiac arrest now with altered mental status.  MEDICATIONS:  Coumadin, Lopressor, Cardizem, Haldol, Lasix, Cogentin.  CONDITIONS OF RECORDING:  This is a 16 channel EEG carried out with the patient in the awake and drowsy states.  DESCRIPTION:  The patient is in drowse throughout the majority of the tracing.  The background activity is dominated by theta rhythms that are poorly organized.  There are intermixed other rhythms noted as well including alpha and delta rhythms.  There is some superimposed faster rhythms anteriorly.  This activity is continuous.  There are brief periods during the tracing where the patient is alert and awake.  During those times, a poorly sustained posterior background rhythm can be noted at approximately 9 Hz alpha activity.  Stage II sleep is not obtained. Hyperventilation was not performed.  Intermittent photic stimulation failed to elicit any change in the tracing.  IMPRESSION:  This EEG is slow and poorly organized and likely represents drowse.  There is some evidence of normal wakefulness.  Stage II sleep was not obtained.  No epileptiform activity noted.          ______________________________ Alexis Goodell, MD    [id number] D:  03/13/2012 19:05:36  T:  03/13/2012 [id number]  Job #:  [id number]

## 2012-03-28 NOTE — Telephone Encounter (Signed)
Message copied by Iona Hansen on Wed Mar 28, 2012  5:01 PM ------      Message from: [place], [place] T      Created: Mon Mar 26, 2012  5:06 PM       K+ still normal      Coumadin clinic adjusting coumadin.      LFTs now normal.      Ok to restart simvastatin.      Check LFTs in 3 weeks      Richardson Dopp, PA-C  5:06 PM 03/26/2012

## 2012-03-30 ENCOUNTER — Ambulatory Visit (INDEPENDENT_AMBULATORY_CARE_PROVIDER_SITE_OTHER): Payer: Medicare Other | Admitting: Pharmacist

## 2012-03-30 DIAGNOSIS — Z7901 Long term (current) use of anticoagulants: Secondary | ICD-10-CM

## 2012-03-30 DIAGNOSIS — I4891 Unspecified atrial fibrillation: Secondary | ICD-10-CM

## 2012-03-30 DIAGNOSIS — I469 Cardiac arrest, cause unspecified: Secondary | ICD-10-CM

## 2012-03-30 LAB — POCT INR: INR: 3.5

## 2012-04-05 ENCOUNTER — Ambulatory Visit (INDEPENDENT_AMBULATORY_CARE_PROVIDER_SITE_OTHER): Payer: Medicare Other | Admitting: Pharmacist

## 2012-04-05 ENCOUNTER — Encounter (INDEPENDENT_AMBULATORY_CARE_PROVIDER_SITE_OTHER): Payer: Medicare Other

## 2012-04-05 ENCOUNTER — Encounter: Payer: Self-pay | Admitting: Cardiology

## 2012-04-05 DIAGNOSIS — I4891 Unspecified atrial fibrillation: Secondary | ICD-10-CM

## 2012-04-05 DIAGNOSIS — Z7901 Long term (current) use of anticoagulants: Secondary | ICD-10-CM

## 2012-04-05 DIAGNOSIS — I469 Cardiac arrest, cause unspecified: Secondary | ICD-10-CM

## 2012-04-05 LAB — POCT INR: INR: 3.4

## 2012-04-09 ENCOUNTER — Telehealth: Payer: Self-pay | Admitting: Cardiology

## 2012-04-09 NOTE — Telephone Encounter (Signed)
New msg Pt has been having some chest tightness and sob that comes and goes for about two weeks. Please call

## 2012-04-09 NOTE — Telephone Encounter (Signed)
Called and spoke with pt's wife--pt c/o tightness in chest, coughing, SOB--wife states she has taken him to [facility] treatment--advised to call us back if problem not taken care of --wife agrees

## 2012-04-12 ENCOUNTER — Telehealth: Payer: Self-pay | Admitting: *Deleted

## 2012-04-12 ENCOUNTER — Ambulatory Visit (INDEPENDENT_AMBULATORY_CARE_PROVIDER_SITE_OTHER): Payer: Medicare Other | Admitting: *Deleted

## 2012-04-12 DIAGNOSIS — I4891 Unspecified atrial fibrillation: Secondary | ICD-10-CM

## 2012-04-12 DIAGNOSIS — I2510 Atherosclerotic heart disease of native coronary artery without angina pectoris: Secondary | ICD-10-CM

## 2012-04-12 DIAGNOSIS — Z7901 Long term (current) use of anticoagulants: Secondary | ICD-10-CM

## 2012-04-12 DIAGNOSIS — I469 Cardiac arrest, cause unspecified: Secondary | ICD-10-CM

## 2012-04-12 LAB — POCT INR: INR: 4

## 2012-04-12 MED ORDER — METOPROLOL SUCCINATE ER 25 MG PO TB24: 25.0000 mg | ORAL_TABLET | Freq: Every day | ORAL | Status: DC

## 2012-04-12 MED ORDER — WARFARIN SODIUM 2.5 MG PO TABS: ORAL_TABLET | ORAL | Status: DC

## 2012-04-12 NOTE — Telephone Encounter (Signed)
pt aksed me to s/w wife about monitor results and any changes. wife notified of monitor results and to d/c cardizem and to start on toprol xl 25 qd. wife also states pt not on lasix or K+ told wife I make a note of this in pt's chart. Per Nicki Reaper w. PAC to cancel 8/15 appt with him and for the pt to keep 8/19 with Dr. [name], wife gave verbal understanding today to all instructions

## 2012-04-19 ENCOUNTER — Ambulatory Visit: Payer: Medicare Other | Admitting: Physician Assistant

## 2012-04-23 ENCOUNTER — Other Ambulatory Visit (INDEPENDENT_AMBULATORY_CARE_PROVIDER_SITE_OTHER): Payer: Medicare Other

## 2012-04-23 ENCOUNTER — Ambulatory Visit (INDEPENDENT_AMBULATORY_CARE_PROVIDER_SITE_OTHER): Payer: Medicare Other | Admitting: *Deleted

## 2012-04-23 ENCOUNTER — Telehealth: Payer: Self-pay | Admitting: Gastroenterology

## 2012-04-23 ENCOUNTER — Encounter: Payer: Self-pay | Admitting: Cardiology

## 2012-04-23 ENCOUNTER — Ambulatory Visit (INDEPENDENT_AMBULATORY_CARE_PROVIDER_SITE_OTHER): Payer: Medicare Other | Admitting: Cardiology

## 2012-04-23 VITALS — BP 130/100 | HR 110 | Ht 75.5 in | Wt 171.8 lb

## 2012-04-23 DIAGNOSIS — I4891 Unspecified atrial fibrillation: Secondary | ICD-10-CM

## 2012-04-23 DIAGNOSIS — I2510 Atherosclerotic heart disease of native coronary artery without angina pectoris: Secondary | ICD-10-CM

## 2012-04-23 DIAGNOSIS — I469 Cardiac arrest, cause unspecified: Secondary | ICD-10-CM

## 2012-04-23 DIAGNOSIS — Z7901 Long term (current) use of anticoagulants: Secondary | ICD-10-CM

## 2012-04-23 DIAGNOSIS — R5383 Other fatigue: Secondary | ICD-10-CM | POA: Insufficient documentation

## 2012-04-23 DIAGNOSIS — R0989 Other specified symptoms and signs involving the circulatory and respiratory systems: Secondary | ICD-10-CM

## 2012-04-23 DIAGNOSIS — I428 Other cardiomyopathies: Secondary | ICD-10-CM

## 2012-04-23 DIAGNOSIS — I10 Essential (primary) hypertension: Secondary | ICD-10-CM

## 2012-04-23 DIAGNOSIS — R0602 Shortness of breath: Secondary | ICD-10-CM

## 2012-04-23 LAB — BASIC METABOLIC PANEL
BUN: 16 mg/dL (ref 6–23)
Chloride: 107 mEq/L (ref 96–112)
Glucose, Bld: 97 mg/dL (ref 70–99)
Potassium: 3.8 mEq/L (ref 3.5–5.1)

## 2012-04-23 LAB — HEPATIC FUNCTION PANEL
ALT: 29 U/L (ref 0–53)
AST: 38 U/L — ABNORMAL HIGH (ref 0–37)
Alkaline Phosphatase: 120 U/L — ABNORMAL HIGH (ref 39–117)
Total Bilirubin: 2 mg/dL — ABNORMAL HIGH (ref 0.3–1.2)

## 2012-04-23 LAB — MAGNESIUM: Magnesium: 2 mg/dL (ref 1.5–2.5)

## 2012-04-23 LAB — CBC WITH DIFFERENTIAL/PLATELET
Basophils Absolute: 0 10*3/uL (ref 0.0–0.1)
HCT: 40.3 % (ref 39.0–52.0)
Hemoglobin: 13.2 g/dL (ref 13.0–17.0)
Lymphs Abs: 2.2 10*3/uL (ref 0.7–4.0)
MCHC: 32.8 g/dL (ref 30.0–36.0)
Monocytes Relative: 9.9 % (ref 3.0–12.0)
Neutro Abs: 2.3 10*3/uL (ref 1.4–7.7)
RDW: 18.4 % — ABNORMAL HIGH (ref 11.5–14.6)

## 2012-04-23 LAB — POCT INR: INR: 2.4

## 2012-04-23 MED ORDER — METOPROLOL SUCCINATE ER 50 MG PO TB24: 50.0000 mg | ORAL_TABLET | Freq: Every day | ORAL | Status: DC

## 2012-04-23 NOTE — Telephone Encounter (Signed)
Pt scheduled to see Dr. Deatra Ina 05/02/12@10 :30am. Left message for Bethel Born to call back.

## 2012-04-23 NOTE — Assessment & Plan Note (Signed)
Previously felt secondary to hypokalemia. LV function preserved. Continue beta blocker. We'll not pursue cardiac catheterization as previous Myoview low risk and there was an alternative explanation for his arrest.

## 2012-04-23 NOTE — Assessment & Plan Note (Signed)
Possibly secondary to CHF. He complains of some orthopnea but there may also be a component of COPD. Check BNP. If elevated we'll add low-dose diuretic with close followup of renal function and potassium.

## 2012-04-23 NOTE — Telephone Encounter (Signed)
Logan White will notify pt of appt date and time.

## 2012-04-23 NOTE — Assessment & Plan Note (Signed)
Check hemoglobin and TSH. 

## 2012-04-23 NOTE — Assessment & Plan Note (Signed)
Recheck liver functions. If completely normalized Will resume statin.

## 2012-04-23 NOTE — Assessment & Plan Note (Signed)
Discontinue aspirin given need for Coumadin. Continue beta blocker. Resume statin if liver functions normalized.

## 2012-04-23 NOTE — Assessment & Plan Note (Signed)
Increase Toprol to 50 mg daily both for blood pressure and for rate control.

## 2012-04-23 NOTE — Patient Instructions (Addendum)
Your physician recommends that you have  lab work today: bmet, magnesium,cbc, lfts, bnp, pt/inr,tsh,   Your physician has recommended you make the following change in your medication: STOP  Aspirin, INCREASE Metoprolol (50 mg ) daily  Your physician has requested that you have a carotid duplex. This test is an ultrasound of the carotid arteries in your neck. It looks at blood flow through these arteries that supply the brain with blood. Allow one hour for this exam. There are no restrictions or special instructions.   You have been referred to GI doctor, DX: GI bleed  Your physician recommends that you schedule a follow-up appointment in: 4 weeks with Dr. [name]

## 2012-04-23 NOTE — Assessment & Plan Note (Signed)
Arrange followup carotid Dopplers.

## 2012-04-23 NOTE — Assessment & Plan Note (Signed)
Patient's heart rate remains elevated. Increase Toprol to 50 mg daily. Observe closely for bradycardia although none demonstrated on recent Holter. Continue Coumadin. Best option may be rate control and anticoagulation as he has severe left atrial enlargement on recent echocardiogram.

## 2012-04-23 NOTE — Progress Notes (Signed)
HPI: Pleasant male for fu of atrial fibrillation and CAD. Patient with a history of PCI of his LAD in October 2004. His last Myoview was performed in May of 2013. There was a distal inferior septal and small apical defect consistent with scar with minimal peri-infarct ischemia. Study not gated. It was felt to be low risk and we are treating medically. He also has cerebrovascular disease. Last carotid Doppler performed in May 2012 showed 40-59% bilateral stenosis. Followup was recommended in 12 months. Patient also with history of GI bleed and atrial fibrillation. He was admitted in June of 2013 following a ventricular fibrillation arrest that was felt related to hypokalemia (K 2.2). Echocardiogram showed an ejection fraction of 25-30%. Repeat study prior to discharge showed improvement to 50-55%, severe left atrial enlargement and mild mitral regurgitation. Note during that hospitalization he was noted to have significant bradycardia with AV nodal blocking agents and ACE maker felt possibly needed in the future for tachycardia bradycardia syndrome. followup Holter monitor in August of 2013 showed the patient's rate was not controlled. Toprol 25 mg daily was added. Since that time he does note mild dyspnea on exertion. He also has orthopnea and edema in the right ankle. No chest pain, palpitations or syncope. He does complain of fatigue and insomnia.   Current Outpatient Prescriptions  Medication Sig Dispense Refill  . albuterol (PROVENTIL HFA;VENTOLIN HFA) 108 (90 BASE) MCG/ACT inhaler Inhale 2 puffs into the lungs every 4 (four) hours as needed. For wheezing.      Marland Kitchen aspirin 81 MG EC tablet Take 81 mg by mouth daily.        . metoprolol succinate (TOPROL-XL) 25 MG 24 hr tablet Take 1 tablet (25 mg total) by mouth daily.  30 tablet  11  . nitroGLYCERIN (NITROSTAT) 0.4 MG SL tablet Place 1 tablet (0.4 mg total) under the tongue every 5 (five) minutes as needed for chest pain (Up to 3 doses).  25 tablet  4    . spironolactone (ALDACTONE) 25 MG tablet Take 1 tablet (25 mg total) by mouth daily.  30 tablet  6  . warfarin (COUMADIN) 2.5 MG tablet Take as directed by coumadin clinic  30 tablet  3  . DISCONTD: famotidine (PEPCID) 20 MG tablet Take 1 tablet (20 mg total) by mouth 2 (two) times daily.  60 tablet  11  . DISCONTD: hydrALAZINE (APRESOLINE) 50 MG tablet Take 50 mg by mouth 3 (three) times daily.           Past Medical History  Diagnosis Date  . Inguinal hernia without mention of obstruction or gangrene, unilateral or unspecified, (not specified as recurrent)   . 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 Peripheral vascular disease, unspecified   . Carotid stenosis     dopplers 1/11: 60-79% bilat.  . Hyperlipidemia     mixed  . Hypertension     a. echo 4/12: EF 50%, asymmetric septal hypertrophy, no SAM or LVOT gradient, LAE, PASP 35  . Coronary artery disease     a. s/p aborted ant STEMI tx with Cypher DES to LAD 10/04 (residual at cath: D1 50%, CFX 40% and multiple dist 70%, EF 55%);   b. myoview 3/10: Ef 47%, infero-apical isch, LOW RISK - med Tx recommended  . CKD (chronic kidney disease)   . Paroxysmal atrial fibrillation 12/2010  . Diverticulosis     2001  . Diabetes mellitus   . H/O: CVA (cardiovascular accident)   . Coronary  artery disease     a. s/p aborted ant STEMI s/p DES to LAD 06/2003.  Marland Kitchen Hypertension   . Glucose intolerance (impaired glucose tolerance)   . Pneumonia   . Carotid artery stenosis     01/2011 - 40-59% bilateral stenosis  . Peripheral vascular disease     h/o LE angioplasty  . 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 Cardiomyopathy     a. EF 50-55% by echo 03/09/12 (was 30% by echo 02/24/12)  . Chronic renal insufficiency   . Paroxysmal atrial fibrillation    . Mitral regurgitation     Mild by echo 03/2012  . Hematemesis     12/2010 felt 2/2 Mallory Weiss tear - pt could not afford colonscopy/EGD at that time so Coumadin was deferred. Coumadin initiated 03/2012 without any evidence for bleeding.  Marland Kitchen Hyperlipidemia   . Inguinal hernia   . Diverticulosis 2001  . QT prolongation   . Bradycardia     Requiring discontinuation of use of BB/CCB  . Elevated LFTs     Shock liver 03/2012    Past Surgical History  Procedure Date  . Popliteal artery angioplasty 01/07/07    s/p LEFT POPLITEAL ARTERY EXPLORATION AND VEIN PATCH ANGIOPLASTY, PER DR. EARLY, SECONDARY TO ISCHEMIC LEFT FOOT RELATED TO LEFT POPLITEAL ARTERY INJURY  . Orif tibia & fibula fractures 01/11/07    OPEN TX OF UNICONDYLAR PLATEAU FRACTURE, IRRIGATION/DEBRIDEMENT OF OPEN FRACTURE INCLUDING BONE, REMOVAL OF EXTERNAL FIXATOR UNDER ANESTHESIA PER DR. MICHAEL HANDY  . Hernia repair   . Skin graft     History   Social History  . Marital Status: Married    Spouse Name: N/A    Number of Children: 4  . Years of Education: N/A   Occupational History  . 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 Smoking status: Former Smoker -- 0.5 packs/day for 30 years    Types: Cigarettes    Quit date: 03/12/2012  . Smokeless tobacco: Not on file  . Alcohol Use: No  . Drug Use: 2 per week    Special: Marijuana  . Sexually Active: Not on file   Other Topics Concern  . 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 1/2 -1 PPD OF CIGARETTES NO ETOH    ROS: no fevers or chills, productive cough, hemoptysis, dysphasia, odynophagia, melena, hematochezia, dysuria, hematuria, rash, seizure activity, orthopnea, PND, pedal edema, claudication. Remaining systems are negative.  Physical Exam: Well-developed well-nourished in no acute distress.  Skin is warm and dry.  HEENT is normal.  Neck  is supple.  Chest is clear to auscultation with normal expansion.  Cardiovascular exam is tachycardic and irregular. Abdominal exam nontender or distended. No masses palpated. Extremities show no edema. neuro grossly intact

## 2012-04-23 NOTE — Assessment & Plan Note (Signed)
Patient still has not had GI evaluation from previous episode of hematemesis which was felt possibly secondary to Mallory-Weiss tear. Will arrange GI evaluation.

## 2012-04-23 NOTE — Assessment & Plan Note (Signed)
Patient has discontinued. 

## 2012-04-24 ENCOUNTER — Encounter (INDEPENDENT_AMBULATORY_CARE_PROVIDER_SITE_OTHER): Payer: Medicare Other

## 2012-04-24 DIAGNOSIS — I10 Essential (primary) hypertension: Secondary | ICD-10-CM

## 2012-04-24 DIAGNOSIS — I428 Other cardiomyopathies: Secondary | ICD-10-CM

## 2012-04-24 DIAGNOSIS — R0602 Shortness of breath: Secondary | ICD-10-CM

## 2012-04-24 DIAGNOSIS — I6529 Occlusion and stenosis of unspecified carotid artery: Secondary | ICD-10-CM

## 2012-04-24 DIAGNOSIS — I2510 Atherosclerotic heart disease of native coronary artery without angina pectoris: Secondary | ICD-10-CM

## 2012-04-25 ENCOUNTER — Other Ambulatory Visit: Payer: Self-pay | Admitting: *Deleted

## 2012-04-25 DIAGNOSIS — Z79899 Other long term (current) drug therapy: Secondary | ICD-10-CM

## 2012-04-25 DIAGNOSIS — R7989 Other specified abnormal findings of blood chemistry: Secondary | ICD-10-CM

## 2012-04-25 MED ORDER — FUROSEMIDE 20 MG PO TABS: 20.0000 mg | ORAL_TABLET | Freq: Every day | ORAL | Status: DC

## 2012-04-26 DIAGNOSIS — Z7901 Long term (current) use of anticoagulants: Secondary | ICD-10-CM

## 2012-04-26 DIAGNOSIS — I469 Cardiac arrest, cause unspecified: Secondary | ICD-10-CM

## 2012-04-26 DIAGNOSIS — I10 Essential (primary) hypertension: Secondary | ICD-10-CM

## 2012-04-26 DIAGNOSIS — N189 Chronic kidney disease, unspecified: Secondary | ICD-10-CM

## 2012-04-26 DIAGNOSIS — I2510 Atherosclerotic heart disease of native coronary artery without angina pectoris: Secondary | ICD-10-CM

## 2012-04-26 DIAGNOSIS — E785 Hyperlipidemia, unspecified: Secondary | ICD-10-CM

## 2012-04-26 DIAGNOSIS — Z8719 Personal history of other diseases of the digestive system: Secondary | ICD-10-CM

## 2012-04-30 ENCOUNTER — Other Ambulatory Visit (INDEPENDENT_AMBULATORY_CARE_PROVIDER_SITE_OTHER): Payer: Medicare Other

## 2012-04-30 DIAGNOSIS — I2510 Atherosclerotic heart disease of native coronary artery without angina pectoris: Secondary | ICD-10-CM

## 2012-04-30 DIAGNOSIS — R7989 Other specified abnormal findings of blood chemistry: Secondary | ICD-10-CM

## 2012-04-30 DIAGNOSIS — Z79899 Other long term (current) drug therapy: Secondary | ICD-10-CM

## 2012-04-30 LAB — BASIC METABOLIC PANEL
GFR: 49.29 mL/min — ABNORMAL LOW (ref >60.00)
Glucose, Bld: 128 mg/dL — ABNORMAL HIGH (ref 70–99)
Potassium: 3 mEq/L — ABNORMAL LOW (ref 3.5–5.1)
Sodium: 140 mEq/L (ref 135–145)

## 2012-05-02 ENCOUNTER — Other Ambulatory Visit: Payer: Self-pay | Admitting: *Deleted

## 2012-05-02 ENCOUNTER — Ambulatory Visit: Payer: Medicare Other | Admitting: Gastroenterology

## 2012-05-02 MED ORDER — POTASSIUM CHLORIDE CRYS ER 20 MEQ PO TBCR: EXTENDED_RELEASE_TABLET | ORAL | Status: DC

## 2012-05-03 ENCOUNTER — Other Ambulatory Visit: Payer: Medicare Other

## 2012-05-04 ENCOUNTER — Telehealth: Payer: Self-pay | Admitting: *Deleted

## 2012-05-04 NOTE — Telephone Encounter (Signed)
Message copied by Michae Kava on Fri May 04, 2012  4:31 PM ------      Message from: [place], [place] T      Created: Fri May 04, 2012  2:12 PM       Has this been addressed?      Richardson Dopp, PA-C  2:12 PM 05/04/2012

## 2012-05-04 NOTE — Telephone Encounter (Signed)
verified with pt about his K+ dose and he stated he was called 8/28 to take 2 tabs that day and then 1 20 meq daily, will have repeat bmet 9/3

## 2012-05-08 ENCOUNTER — Other Ambulatory Visit: Payer: Medicare Other

## 2012-05-09 ENCOUNTER — Telehealth: Payer: Self-pay | Admitting: *Deleted

## 2012-05-09 NOTE — Telephone Encounter (Signed)
Message copied by Michae Kava on Wed May 09, 2012  5:15 PM ------      Message from: [place], [place] T      Created: Wed May 09, 2012  3:58 PM       Has he had repeat BMET yet?      Very important to make sure his K+ is ok b/c of his recent admission for VFib thought to be due to low K+.      Richardson Dopp, PA-C  3:58 PM 05/09/2012

## 2012-05-09 NOTE — Telephone Encounter (Signed)
called pt to see why he n/s for lab yesterday, said he did ot have money for co-pay and was going to come in this Friday 9/58for bmet, told pt that was fine and I will let the PA know of this

## 2012-05-10 ENCOUNTER — Other Ambulatory Visit: Payer: Self-pay | Admitting: *Deleted

## 2012-05-10 DIAGNOSIS — I10 Essential (primary) hypertension: Secondary | ICD-10-CM

## 2012-05-10 DIAGNOSIS — I2510 Atherosclerotic heart disease of native coronary artery without angina pectoris: Secondary | ICD-10-CM

## 2012-05-11 ENCOUNTER — Ambulatory Visit (INDEPENDENT_AMBULATORY_CARE_PROVIDER_SITE_OTHER): Payer: Medicare Other | Admitting: *Deleted

## 2012-05-11 DIAGNOSIS — I10 Essential (primary) hypertension: Secondary | ICD-10-CM

## 2012-05-11 DIAGNOSIS — I2510 Atherosclerotic heart disease of native coronary artery without angina pectoris: Secondary | ICD-10-CM

## 2012-05-11 LAB — BASIC METABOLIC PANEL
CO2: 20 mEq/L (ref 19–32)
Chloride: 109 mEq/L (ref 96–112)
Sodium: 140 mEq/L (ref 135–145)

## 2012-05-16 ENCOUNTER — Other Ambulatory Visit: Payer: Self-pay | Admitting: *Deleted

## 2012-05-16 ENCOUNTER — Telehealth: Payer: Self-pay | Admitting: Cardiology

## 2012-05-16 DIAGNOSIS — N289 Disorder of kidney and ureter, unspecified: Secondary | ICD-10-CM

## 2012-05-16 NOTE — Telephone Encounter (Signed)
Patient returning nurse call, he can be reached at hm#

## 2012-05-16 NOTE — Telephone Encounter (Signed)
Left message of results for pt, follow up lab appt made for four weeks

## 2012-05-21 NOTE — Progress Notes (Signed)
BMET is at baseline.

## 2012-05-28 ENCOUNTER — Encounter: Payer: Self-pay | Admitting: Cardiology

## 2012-05-28 ENCOUNTER — Ambulatory Visit (INDEPENDENT_AMBULATORY_CARE_PROVIDER_SITE_OTHER): Payer: Medicare Other | Admitting: Cardiology

## 2012-05-28 VITALS — BP 129/77 | HR 87 | Wt 169.0 lb

## 2012-05-28 DIAGNOSIS — I2510 Atherosclerotic heart disease of native coronary artery without angina pectoris: Secondary | ICD-10-CM

## 2012-05-28 DIAGNOSIS — E875 Hyperkalemia: Secondary | ICD-10-CM

## 2012-05-28 DIAGNOSIS — E785 Hyperlipidemia, unspecified: Secondary | ICD-10-CM

## 2012-05-28 DIAGNOSIS — I4891 Unspecified atrial fibrillation: Secondary | ICD-10-CM

## 2012-05-28 DIAGNOSIS — I679 Cerebrovascular disease, unspecified: Secondary | ICD-10-CM

## 2012-05-28 DIAGNOSIS — I10 Essential (primary) hypertension: Secondary | ICD-10-CM

## 2012-05-28 NOTE — Assessment & Plan Note (Signed)
Previous hypokalemia felt causative of cardiac arrest. Plan repeat BMET.

## 2012-05-28 NOTE — Assessment & Plan Note (Signed)
Blood pressure controlled.continue present medications. 

## 2012-05-28 NOTE — Assessment & Plan Note (Signed)
Not on aspirin given need for Coumadin. Resume statin if liver functions normalized.

## 2012-05-28 NOTE — Progress Notes (Signed)
HPI: Pleasant male for fu of atrial fibrillation and CAD. Patient with a history of PCI of his LAD in October 2004. His last Myoview was performed in May of 2013. There was a distal inferior septal and small apical defect consistent with scar with minimal peri-infarct ischemia. Study not gated. It was felt to be low risk and we are treating medically. He also has cerebrovascular disease. Last carotid Doppler performed in August 2013 showed 40-59% left and 60-79% right stenosis. Followup was recommended in 6 months. Patient also with history of GI bleed and atrial fibrillation. He was admitted in June of 2013 following a ventricular fibrillation arrest that was felt related to hypokalemia (K 2.2). Echocardiogram showed an ejection fraction of 25-30%. Repeat study prior to discharge showed improvement to 50-55%, severe left atrial enlargement and mild mitral regurgitation. Note during that hospitalization he was noted to have significant bradycardia with AV nodal blocking agents and pacemaker felt possibly needed in the future for tachycardia bradycardia syndrome. Followup Holter monitor in August of 2013 showed the patient's rate was not controlled. Toprol 25 mg daily was added and eventually increased to 50 mg daily. Since I last saw him, he has some dyspnea on exertion but improved. No orthopnea, PND, pedal edema, chest pain or syncope.   Current Outpatient Prescriptions  Medication Sig Dispense Refill  . albuterol (PROVENTIL HFA;VENTOLIN HFA) 108 (90 BASE) MCG/ACT inhaler Inhale 2 puffs into the lungs every 4 (four) hours as needed. For wheezing.      . furosemide (LASIX) 20 MG tablet Take 1 tablet (20 mg total) by mouth daily.  30 tablet  12  . metoprolol succinate (TOPROL-XL) 50 MG 24 hr tablet Take 1 tablet (50 mg total) by mouth daily.  30 tablet  11  . nitroGLYCERIN (NITROSTAT) 0.4 MG SL tablet Place 1 tablet (0.4 mg total) under the tongue every 5 (five) minutes as needed for chest pain (Up to 3  doses).  25 tablet  4  . potassium chloride SA (K-DUR,KLOR-CON) 20 MEQ tablet one tablet daily      . spironolactone (ALDACTONE) 25 MG tablet Take 1 tablet (25 mg total) by mouth daily.  30 tablet  6  . warfarin (COUMADIN) 2.5 MG tablet Take as directed by coumadin clinic  30 tablet  3  . DISCONTD: famotidine (PEPCID) 20 MG tablet Take 1 tablet (20 mg total) by mouth 2 (two) times daily.  60 tablet  11  . DISCONTD: hydrALAZINE (APRESOLINE) 50 MG tablet Take 50 mg by mouth 3 (three) times daily.           Past Medical History  Diagnosis Date  . Inguinal hernia without mention of obstruction or gangrene, unilateral or unspecified, (not specified as recurrent)   . 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 Peripheral vascular disease, unspecified   . Carotid stenosis     dopplers 1/11: 60-79% bilat.  . Hyperlipidemia     mixed  . Hypertension     a. echo 4/12: EF 50%, asymmetric septal hypertrophy, no SAM or LVOT gradient, LAE, PASP 35  . Coronary artery disease     a. s/p aborted ant STEMI tx with Cypher DES to LAD 10/04 (residual at cath: D1 50%, CFX 40% and multiple dist 70%, EF 55%);   b. myoview 3/10: Ef 47%, infero-apical isch, LOW RISK - med Tx recommended  . CKD (chronic kidney disease)   . Paroxysmal atrial fibrillation 12/2010  . Diverticulosis     2001  .  Diabetes mellitus   . H/O: CVA (cardiovascular accident)   . Coronary artery disease     a. s/p aborted ant STEMI s/p DES to LAD 06/2003.  Marland Kitchen Hypertension   . Glucose intolerance (impaired glucose tolerance)   . Pneumonia   . Carotid artery stenosis     01/2011 - 40-59% bilateral stenosis  . Peripheral vascular disease     h/o LE angioplasty  . 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 Cardiomyopathy     a. EF 50-55% by echo  03/09/12 (was 30% by echo 02/24/12)  . Chronic renal insufficiency   . Paroxysmal atrial fibrillation   . Mitral regurgitation     Mild by echo 03/2012  . Hematemesis     12/2010 felt 2/2 Mallory Weiss tear - pt could not afford colonscopy/EGD at that time so Coumadin was deferred. Coumadin initiated 03/2012 without any evidence for bleeding.  Marland Kitchen Hyperlipidemia   . Inguinal hernia   . Diverticulosis 2001  . QT prolongation   . Bradycardia     Requiring discontinuation of use of BB/CCB  . Elevated LFTs     Shock liver 03/2012    Past Surgical History  Procedure Date  . Popliteal artery angioplasty 01/07/07    s/p LEFT POPLITEAL ARTERY EXPLORATION AND VEIN PATCH ANGIOPLASTY, PER DR. EARLY, SECONDARY TO ISCHEMIC LEFT FOOT RELATED TO LEFT POPLITEAL ARTERY INJURY  . Orif tibia & fibula fractures 01/11/07    OPEN TX OF UNICONDYLAR PLATEAU FRACTURE, IRRIGATION/DEBRIDEMENT OF OPEN FRACTURE INCLUDING BONE, REMOVAL OF EXTERNAL FIXATOR UNDER ANESTHESIA PER DR. MICHAEL HANDY  . Hernia repair   . Skin graft     History   Social History  . Marital Status: Married    Spouse Name: N/A    Number of Children: 4  . Years of Education: N/A   Occupational History  . 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 Smoking status: Former Smoker -- 0.5 packs/day for 30 years    Types: Cigarettes    Quit date: 03/12/2012  . Smokeless tobacco: Not on file  . Alcohol Use: No  . Drug Use: 2 per week    Special: Marijuana  . Sexually Active: Not on file   Other Topics Concern  . 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 1/2 -1 PPD OF CIGARETTES NO ETOH    ROS: no fevers or chills, productive cough, hemoptysis, dysphasia, odynophagia, melena, hematochezia, dysuria, hematuria, rash, seizure activity, orthopnea, PND, pedal edema, claudication. Remaining systems are negative.  Physical  Exam: Well-developed well-nourished in no acute distress.  Skin is warm and dry.  HEENT is normal.  Neck is supple. Chest is clear to auscultation with normal expansion.  Cardiovascular exam is irregular  Abdominal exam nontender or distended. No masses palpated. Extremities show no edema. neuro grossly intact

## 2012-05-28 NOTE — Assessment & Plan Note (Signed)
Patient remains in atrial fibrillation on examination. Continue Toprol and Coumadin. I am not convinced that he is particularly symptomatically from this. He also has severe left atrial enlargement on echo. We will plan rate control and anticoagulation. I will schedule a CardioNet to make sure that his rate is adequately controlled. Note he did have some bradycardia when he was in the hospital and there was discussion that he may require pacemaker in the future.

## 2012-05-28 NOTE — Assessment & Plan Note (Signed)
Plan recheck liver functions. If normalized we'll resume statin.

## 2012-05-28 NOTE — Patient Instructions (Addendum)
Your physician has recommended that you wear an event monitor. Event monitors are medical devices that record the heart's electrical activity. Doctors most often [place] these monitors to diagnose arrhythmias. Arrhythmias are problems with the speed or rhythm of the heartbeat. The monitor is a small, portable device. You can wear one while you do your normal daily activities. This is usually used to diagnose what is causing palpitations/syncope (passing out).  Your physician recommends that you return for lab work in: today bmet, liver  Your physician recommends that you schedule a follow-up appointment in: 3 months with Dr [name]

## 2012-05-28 NOTE — Assessment & Plan Note (Signed)
Repeat carotid Dopplers February 2014.

## 2012-05-29 LAB — BASIC METABOLIC PANEL
BUN: 32 mg/dL — ABNORMAL HIGH (ref 6–23)
Chloride: 108 mEq/L (ref 96–112)
Glucose, Bld: 99 mg/dL (ref 70–99)
Potassium: 4.3 mEq/L (ref 3.5–5.1)
Sodium: 144 mEq/L (ref 135–145)

## 2012-05-29 LAB — HEPATIC FUNCTION PANEL
ALT: 29 U/L (ref 0–53)
AST: 36 U/L (ref 0–37)
Albumin: 3.7 g/dL (ref 3.5–5.2)
Alkaline Phosphatase: 118 U/L — ABNORMAL HIGH (ref 39–117)
Total Bilirubin: 1.4 mg/dL — ABNORMAL HIGH (ref 0.3–1.2)

## 2012-05-30 ENCOUNTER — Encounter (INDEPENDENT_AMBULATORY_CARE_PROVIDER_SITE_OTHER): Payer: Medicare Other

## 2012-05-30 ENCOUNTER — Ambulatory Visit: Payer: Medicare Other | Admitting: Gastroenterology

## 2012-05-30 DIAGNOSIS — I4891 Unspecified atrial fibrillation: Secondary | ICD-10-CM

## 2012-06-06 ENCOUNTER — Other Ambulatory Visit: Payer: Self-pay | Admitting: *Deleted

## 2012-06-06 DIAGNOSIS — E7800 Pure hypercholesterolemia, unspecified: Secondary | ICD-10-CM

## 2012-06-06 MED ORDER — PRAVASTATIN SODIUM 40 MG PO TABS: 40.0000 mg | ORAL_TABLET | Freq: Every evening | ORAL | Status: DC

## 2012-06-13 ENCOUNTER — Other Ambulatory Visit: Payer: Medicare Other

## 2012-06-21 ENCOUNTER — Telehealth: Payer: Self-pay | Admitting: *Deleted

## 2012-06-21 DIAGNOSIS — I10 Essential (primary) hypertension: Secondary | ICD-10-CM

## 2012-06-21 DIAGNOSIS — I428 Other cardiomyopathies: Secondary | ICD-10-CM

## 2012-06-21 DIAGNOSIS — I2510 Atherosclerotic heart disease of native coronary artery without angina pectoris: Secondary | ICD-10-CM

## 2012-06-21 DIAGNOSIS — R0602 Shortness of breath: Secondary | ICD-10-CM

## 2012-06-21 MED ORDER — METOPROLOL SUCCINATE ER 50 MG PO TB24: 50.0000 mg | ORAL_TABLET | Freq: Two times a day (BID) | ORAL | Status: DC

## 2012-06-21 NOTE — Telephone Encounter (Signed)
Spoke with pt and wife, aware of med change and monitor results

## 2012-06-21 NOTE — Telephone Encounter (Signed)
Left message for pt to call, monitor reviewed by dr [name] shows atrial fib with increased rate. Per dr [name] the pt needs to increase toprol to 50 mg bid.

## 2012-07-19 ENCOUNTER — Other Ambulatory Visit: Payer: Medicare Other

## 2012-07-25 ENCOUNTER — Other Ambulatory Visit (INDEPENDENT_AMBULATORY_CARE_PROVIDER_SITE_OTHER): Payer: Medicare Other

## 2012-07-25 ENCOUNTER — Other Ambulatory Visit: Payer: Self-pay | Admitting: *Deleted

## 2012-07-25 DIAGNOSIS — E7800 Pure hypercholesterolemia, unspecified: Secondary | ICD-10-CM

## 2012-07-25 LAB — HEPATIC FUNCTION PANEL
Albumin: 3.8 g/dL (ref 3.5–5.2)
Alkaline Phosphatase: 155 U/L — ABNORMAL HIGH (ref 39–117)
Total Protein: 8.2 g/dL (ref 6.0–8.3)

## 2012-07-25 LAB — LIPID PANEL
Cholesterol: 135 mg/dL (ref 0–200)
HDL: 28.3 mg/dL — ABNORMAL LOW (ref >39.00)
LDL Cholesterol: 86 mg/dL (ref 0–99)
Total CHOL/HDL Ratio: 5
Triglycerides: 105 mg/dL (ref 0.0–149.0)

## 2012-08-10 ENCOUNTER — Other Ambulatory Visit: Payer: Self-pay | Admitting: Cardiology

## 2012-08-14 ENCOUNTER — Ambulatory Visit (INDEPENDENT_AMBULATORY_CARE_PROVIDER_SITE_OTHER): Payer: Medicare Other

## 2012-08-14 DIAGNOSIS — Z7901 Long term (current) use of anticoagulants: Secondary | ICD-10-CM

## 2012-08-14 DIAGNOSIS — I4891 Unspecified atrial fibrillation: Secondary | ICD-10-CM

## 2012-08-14 DIAGNOSIS — I469 Cardiac arrest, cause unspecified: Secondary | ICD-10-CM

## 2012-08-14 LAB — POCT INR: INR: 5.4

## 2012-08-23 ENCOUNTER — Ambulatory Visit (INDEPENDENT_AMBULATORY_CARE_PROVIDER_SITE_OTHER): Payer: Medicare Other | Admitting: Cardiology

## 2012-08-23 ENCOUNTER — Encounter: Payer: Self-pay | Admitting: Cardiology

## 2012-08-23 ENCOUNTER — Ambulatory Visit (INDEPENDENT_AMBULATORY_CARE_PROVIDER_SITE_OTHER): Payer: Medicare Other

## 2012-08-23 VITALS — BP 122/64 | HR 79 | Wt 179.0 lb

## 2012-08-23 DIAGNOSIS — I4891 Unspecified atrial fibrillation: Secondary | ICD-10-CM

## 2012-08-23 DIAGNOSIS — R7989 Other specified abnormal findings of blood chemistry: Secondary | ICD-10-CM

## 2012-08-23 DIAGNOSIS — R945 Abnormal results of liver function studies: Secondary | ICD-10-CM

## 2012-08-23 DIAGNOSIS — I10 Essential (primary) hypertension: Secondary | ICD-10-CM

## 2012-08-23 DIAGNOSIS — I679 Cerebrovascular disease, unspecified: Secondary | ICD-10-CM

## 2012-08-23 DIAGNOSIS — N289 Disorder of kidney and ureter, unspecified: Secondary | ICD-10-CM

## 2012-08-23 DIAGNOSIS — I469 Cardiac arrest, cause unspecified: Secondary | ICD-10-CM

## 2012-08-23 DIAGNOSIS — I2510 Atherosclerotic heart disease of native coronary artery without angina pectoris: Secondary | ICD-10-CM

## 2012-08-23 DIAGNOSIS — Z7901 Long term (current) use of anticoagulants: Secondary | ICD-10-CM

## 2012-08-23 LAB — BASIC METABOLIC PANEL
CO2: 21 mEq/L (ref 19–32)
Chloride: 111 mEq/L (ref 96–112)
Glucose, Bld: 90 mg/dL (ref 70–99)
Potassium: 4.2 mEq/L (ref 3.5–5.1)
Sodium: 139 mEq/L (ref 135–145)

## 2012-08-23 LAB — CBC WITH DIFFERENTIAL/PLATELET
Eosinophils Absolute: 0.3 10*3/uL (ref 0.0–0.7)
Eosinophils Relative: 4.2 % (ref 0.0–5.0)
Lymphocytes Relative: 38.2 % (ref 12.0–46.0)
MCHC: 32.7 g/dL (ref 30.0–36.0)
MCV: 80.5 fl (ref 78.0–100.0)
Monocytes Absolute: 0.6 10*3/uL (ref 0.1–1.0)
Neutrophils Relative %: 46.3 % (ref 43.0–77.0)
Platelets: 194 10*3/uL (ref 150.0–400.0)
WBC: 6.1 10*3/uL (ref 4.5–10.5)

## 2012-08-23 LAB — LIPID PANEL
HDL: 28.8 mg/dL — ABNORMAL LOW (ref >39.00)
Total CHOL/HDL Ratio: 5
Triglycerides: 120 mg/dL (ref 0.0–149.0)
VLDL: 24 mg/dL (ref 0.0–40.0)

## 2012-08-23 LAB — HEPATIC FUNCTION PANEL
Albumin: 3.7 g/dL (ref 3.5–5.2)
Total Bilirubin: 0.8 mg/dL (ref 0.3–1.2)

## 2012-08-23 MED ORDER — METOPROLOL SUCCINATE ER 50 MG PO TB24: 50.0000 mg | ORAL_TABLET | Freq: Every day | ORAL | Status: DC

## 2012-08-23 NOTE — Addendum Note (Signed)
Addended by: Eulis Foster on: 08/23/2012 02:36 PM   Modules accepted: Orders

## 2012-08-23 NOTE — Assessment & Plan Note (Signed)
Continue statin. Not on aspirin given need for Coumadin. 

## 2012-08-23 NOTE — Assessment & Plan Note (Signed)
Blood pressure controlled. Continue present medications. Check renal function.

## 2012-08-23 NOTE — Progress Notes (Signed)
HPI: Pleasant male for fu of atrial fibrillation and CAD. Patient with a history of PCI of his LAD in October 2004. His last Myoview was performed in May of 2013. There was a distal inferior septal and small apical defect consistent with scar with minimal peri-infarct ischemia. Study not gated. It was felt to be low risk and we are treating medically. He also has cerebrovascular disease. Last carotid Doppler performed in August 2013 showed 40-59% left and 60-79% right stenosis. Followup was recommended in 6 months. He was admitted in June of 2013 following a ventricular fibrillation arrest that was felt related to hypokalemia (K 2.2). Echocardiogram showed an ejection fraction of 25-30%. Repeat study prior to discharge showed improvement to 50-55%, severe left atrial enlargement and mild mitral regurgitation. Note during that hospitalization he was noted to have significant bradycardia with AV nodal blocking agents and pacemaker felt possibly needed in the future for tachycardia bradycardia syndrome. Followup Holter monitor in August of 2013 showed the patient's rate was not controlled. Toprol 25 mg daily was added and eventually increased to 50 mg daily. Followup event monitor in October of 2013 showed that his rate was not controlled and we increased his Toprol to 50 mg by mouth twice a day. However, he is taking only 25 mg daily. I last saw him in Sept 2013. Since then, the patient has dyspnea with more extreme activities but not with routine activities. It is relieved with rest. It is not associated with chest pain. There is no orthopnea, PND or pedal edema. There is no syncope or palpitations. There is no exertional chest pain.   Current Outpatient Prescriptions  Medication Sig Dispense Refill  . metoprolol succinate (TOPROL-XL) 25 MG 24 hr tablet Take 25 mg by mouth daily.      . nitroGLYCERIN (NITROSTAT) 0.4 MG SL tablet Place 1 tablet (0.4 mg total) under the tongue every 5 (five) minutes as needed  for chest pain (Up to 3 doses).  25 tablet  4  . potassium chloride SA (K-DUR,KLOR-CON) 20 MEQ tablet one tablet daily      . pravastatin (PRAVACHOL) 40 MG tablet Take 40 mg by mouth daily.      Marland Kitchen spironolactone (ALDACTONE) 25 MG tablet Take 1 tablet (25 mg total) by mouth daily.  30 tablet  6  . warfarin (COUMADIN) 2.5 MG tablet TAKE AS DIRECTED BY COUMADIN  CLINIC  30 tablet  1  . [DISCONTINUED] famotidine (PEPCID) 20 MG tablet Take 1 tablet (20 mg total) by mouth 2 (two) times daily.  60 tablet  11  . [DISCONTINUED] hydrALAZINE (APRESOLINE) 50 MG tablet Take 50 mg by mouth 3 (three) times daily.           Past Medical History  Diagnosis Date  . Inguinal hernia without mention of obstruction or gangrene, unilateral or unspecified, (not specified as recurrent)   . 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 Peripheral vascular disease, unspecified   . Carotid stenosis     dopplers 1/11: 60-79% bilat.  . Hyperlipidemia     mixed  . Hypertension     a. echo 4/12: EF 50%, asymmetric septal hypertrophy, no SAM or LVOT gradient, LAE, PASP 35  . Coronary artery disease     a. s/p aborted ant STEMI tx with Cypher DES to LAD 10/04 (residual at cath: D1 50%, CFX 40% and multiple dist 70%, EF 55%);   b. myoview 3/10: Ef 47%, infero-apical isch, LOW RISK - med Tx recommended  .  CKD (chronic kidney disease)   . Paroxysmal atrial fibrillation 12/2010  . Diverticulosis     2001  . Diabetes mellitus   . H/O: CVA (cardiovascular accident)   . Coronary artery disease     a. s/p aborted ant STEMI s/p DES to LAD 06/2003.  Marland Kitchen Hypertension   . Glucose intolerance (impaired glucose tolerance)   . Pneumonia   . Carotid artery stenosis     01/2011 - 40-59% bilateral stenosis  . Peripheral vascular disease     h/o LE angioplasty  . Cardiac arrest - ventricular fibrillation     03/2012 in setting of hypokalemia (prolonged hosp with VDRF, tracheobronchitis, ARF, shock liver,  PAF, AMS felt secondary to post-anoxic encephalopathy/shock)  . Cardiomyopathy     a. EF 50-55% by echo 03/09/12 (was 30% by echo 02/24/12)  . Chronic renal insufficiency   . Paroxysmal atrial fibrillation   . Mitral regurgitation     Mild by echo 03/2012  . Hematemesis     12/2010 felt 2/2 Mallory Weiss tear - pt could not afford colonscopy/EGD at that time so Coumadin was deferred. Coumadin initiated 03/2012 without any evidence for bleeding.  Marland Kitchen Hyperlipidemia   . Inguinal hernia   . Diverticulosis 2001  . QT prolongation   . Bradycardia     Requiring discontinuation of use of BB/CCB  . Elevated LFTs     Shock liver 03/2012    Past Surgical History  Procedure Date  . Popliteal artery angioplasty 01/07/07    s/p LEFT POPLITEAL ARTERY EXPLORATION AND VEIN PATCH ANGIOPLASTY, PER DR. EARLY, SECONDARY TO ISCHEMIC LEFT FOOT RELATED TO LEFT POPLITEAL ARTERY INJURY  . Orif tibia & fibula fractures 01/11/07    OPEN TX OF UNICONDYLAR PLATEAU FRACTURE, IRRIGATION/DEBRIDEMENT OF OPEN FRACTURE INCLUDING BONE, REMOVAL OF EXTERNAL FIXATOR UNDER ANESTHESIA PER DR. MICHAEL HANDY  . Hernia repair   . Skin graft     History   Social History  . Marital Status: Married    Spouse Name: N/A    Number of Children: 4  . Years of Education: N/A   Occupational History  . 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 Smoking status: Former Smoker -- 0.5 packs/day for 30 years    Types: Cigarettes    Quit date: 03/12/2012  . Smokeless tobacco: Not on file  . Alcohol Use: No  . Drug Use: 2 per week    Special: Marijuana  . Sexually Active: Not on file   Other Topics Concern  . 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 1/2 -1 PPD OF CIGARETTES NO ETOH    ROS: no fevers or chills, productive cough, hemoptysis, dysphasia, odynophagia, melena, hematochezia, dysuria, hematuria, rash,  seizure activity, orthopnea, PND, pedal edema, claudication. Remaining systems are negative.  Physical Exam: Well-developed well-nourished in no acute distress.  Skin is warm and dry.  HEENT is normal.  Neck is supple.  Chest is clear to auscultation with normal expansion.  Cardiovascular exam is irregular Abdominal exam nontender or distended. No masses palpated. Extremities show no edema. neuro grossly intact  ECG atrial fibrillation at a rate of 91. Left ventricular hypertrophy with repolarization abnormality.

## 2012-08-23 NOTE — Assessment & Plan Note (Signed)
Continue statin. Repeat carotid Dopplers February 2014.

## 2012-08-23 NOTE — Assessment & Plan Note (Signed)
Patient counseled on discontinuing. 

## 2012-08-23 NOTE — Assessment & Plan Note (Signed)
Recheck potassium and renal function °

## 2012-08-23 NOTE — Patient Instructions (Addendum)
Your physician wants you to follow-up in: [place] will receive a reminder letter in the mail two months in advance. If you don't receive a letter, please call our office to schedule the follow-up appointment.   INCREASE METOPROLOL TO 50 MG ONCE DAILY  Your physician recommends that you HAVE LAB WORK TODAY

## 2012-08-23 NOTE — Assessment & Plan Note (Signed)
Plan continue rate control and anticoagulation. Increase Toprol to 50 mg daily. Check hemoglobin.

## 2012-08-23 NOTE — Assessment & Plan Note (Signed)
Continue statin. He has had increased liver functions in the past. Repeat liver functions and lipid panel.

## 2012-09-04 ENCOUNTER — Other Ambulatory Visit: Payer: Self-pay | Admitting: *Deleted

## 2012-09-04 DIAGNOSIS — R748 Abnormal levels of other serum enzymes: Secondary | ICD-10-CM

## 2012-09-06 ENCOUNTER — Other Ambulatory Visit: Payer: Self-pay | Admitting: *Deleted

## 2012-09-06 ENCOUNTER — Ambulatory Visit (INDEPENDENT_AMBULATORY_CARE_PROVIDER_SITE_OTHER): Payer: Medicare Other | Admitting: *Deleted

## 2012-09-06 ENCOUNTER — Other Ambulatory Visit (INDEPENDENT_AMBULATORY_CARE_PROVIDER_SITE_OTHER): Payer: Medicare Other

## 2012-09-06 DIAGNOSIS — Z7901 Long term (current) use of anticoagulants: Secondary | ICD-10-CM

## 2012-09-06 DIAGNOSIS — I469 Cardiac arrest, cause unspecified: Secondary | ICD-10-CM

## 2012-09-06 DIAGNOSIS — R7989 Other specified abnormal findings of blood chemistry: Secondary | ICD-10-CM

## 2012-09-06 DIAGNOSIS — R748 Abnormal levels of other serum enzymes: Secondary | ICD-10-CM

## 2012-09-06 DIAGNOSIS — I4891 Unspecified atrial fibrillation: Secondary | ICD-10-CM

## 2012-09-06 LAB — POCT INR: INR: 3.7

## 2012-09-06 LAB — GAMMA GT: GGT: 108 U/L — ABNORMAL HIGH (ref 7–51)

## 2012-09-20 ENCOUNTER — Ambulatory Visit (INDEPENDENT_AMBULATORY_CARE_PROVIDER_SITE_OTHER): Payer: Medicare Other | Admitting: *Deleted

## 2012-09-20 DIAGNOSIS — Z7901 Long term (current) use of anticoagulants: Secondary | ICD-10-CM

## 2012-09-20 DIAGNOSIS — I4891 Unspecified atrial fibrillation: Secondary | ICD-10-CM

## 2012-09-20 DIAGNOSIS — I469 Cardiac arrest, cause unspecified: Secondary | ICD-10-CM

## 2012-10-31 ENCOUNTER — Other Ambulatory Visit: Payer: Self-pay | Admitting: Physician Assistant

## 2012-10-31 ENCOUNTER — Other Ambulatory Visit: Payer: Self-pay | Admitting: Cardiology

## 2012-11-02 ENCOUNTER — Ambulatory Visit (INDEPENDENT_AMBULATORY_CARE_PROVIDER_SITE_OTHER): Payer: Medicare Other | Admitting: *Deleted

## 2012-11-02 LAB — POCT INR: INR: 2.6

## 2012-11-02 MED ORDER — WARFARIN SODIUM 2.5 MG PO TABS: 2.5000 mg | ORAL_TABLET | ORAL | Status: DC

## 2012-12-05 ENCOUNTER — Ambulatory Visit (INDEPENDENT_AMBULATORY_CARE_PROVIDER_SITE_OTHER): Payer: Medicare Other | Admitting: *Deleted

## 2012-12-05 DIAGNOSIS — Z7901 Long term (current) use of anticoagulants: Secondary | ICD-10-CM

## 2012-12-05 DIAGNOSIS — I469 Cardiac arrest, cause unspecified: Secondary | ICD-10-CM

## 2012-12-05 DIAGNOSIS — I4891 Unspecified atrial fibrillation: Secondary | ICD-10-CM

## 2012-12-05 LAB — POCT INR: INR: 2.4

## 2012-12-30 ENCOUNTER — Emergency Department (HOSPITAL_COMMUNITY): Payer: Medicare Other

## 2012-12-30 ENCOUNTER — Inpatient Hospital Stay (HOSPITAL_COMMUNITY)
Admission: EM | Admit: 2012-12-30 | Discharge: 2013-01-11 | DRG: 250 | Disposition: A | Discharge status: Home Health | Payer: Medicare Other | Attending: Cardiology | Admitting: Cardiology

## 2012-12-30 ENCOUNTER — Ambulatory Visit (HOSPITAL_COMMUNITY): Admit: 2012-12-30 | Payer: Self-pay | Admitting: Cardiology

## 2012-12-30 ENCOUNTER — Encounter (HOSPITAL_COMMUNITY)
Admission: EM | Disposition: A | Discharge status: Home Health | Payer: Self-pay | Source: Home / Self Care | Attending: Pulmonary Disease

## 2012-12-30 ENCOUNTER — Encounter (HOSPITAL_COMMUNITY): Payer: Self-pay | Admitting: Emergency Medicine

## 2012-12-30 DIAGNOSIS — N170 Acute kidney failure with tubular necrosis: Secondary | ICD-10-CM | POA: Diagnosis not present

## 2012-12-30 DIAGNOSIS — Z8249 Family history of ischemic heart disease and other diseases of the circulatory system: Secondary | ICD-10-CM

## 2012-12-30 DIAGNOSIS — Y92009 Unspecified place in unspecified non-institutional (private) residence as the place of occurrence of the external cause: Secondary | ICD-10-CM

## 2012-12-30 DIAGNOSIS — I469 Cardiac arrest, cause unspecified: Secondary | ICD-10-CM | POA: Diagnosis present

## 2012-12-30 DIAGNOSIS — I739 Peripheral vascular disease, unspecified: Secondary | ICD-10-CM | POA: Diagnosis present

## 2012-12-30 DIAGNOSIS — I472 Ventricular tachycardia, unspecified: Secondary | ICD-10-CM | POA: Diagnosis present

## 2012-12-30 DIAGNOSIS — J9600 Acute respiratory failure, unspecified whether with hypoxia or hypercapnia: Secondary | ICD-10-CM | POA: Diagnosis present

## 2012-12-30 DIAGNOSIS — I10 Essential (primary) hypertension: Secondary | ICD-10-CM | POA: Diagnosis present

## 2012-12-30 DIAGNOSIS — N183 Chronic kidney disease, stage 3 unspecified: Secondary | ICD-10-CM | POA: Diagnosis present

## 2012-12-30 DIAGNOSIS — I4901 Ventricular fibrillation: Secondary | ICD-10-CM | POA: Diagnosis present

## 2012-12-30 DIAGNOSIS — I482 Chronic atrial fibrillation, unspecified: Secondary | ICD-10-CM

## 2012-12-30 DIAGNOSIS — Z8673 Personal history of transient ischemic attack (TIA), and cerebral infarction without residual deficits: Secondary | ICD-10-CM

## 2012-12-30 DIAGNOSIS — N179 Acute kidney failure, unspecified: Secondary | ICD-10-CM

## 2012-12-30 DIAGNOSIS — I2582 Chronic total occlusion of coronary artery: Secondary | ICD-10-CM | POA: Diagnosis present

## 2012-12-30 DIAGNOSIS — Z7901 Long term (current) use of anticoagulants: Secondary | ICD-10-CM

## 2012-12-30 DIAGNOSIS — R410 Disorientation, unspecified: Secondary | ICD-10-CM

## 2012-12-30 DIAGNOSIS — E876 Hypokalemia: Secondary | ICD-10-CM | POA: Diagnosis present

## 2012-12-30 DIAGNOSIS — E875 Hyperkalemia: Secondary | ICD-10-CM | POA: Diagnosis present

## 2012-12-30 DIAGNOSIS — I4729 Other ventricular tachycardia: Secondary | ICD-10-CM | POA: Diagnosis present

## 2012-12-30 DIAGNOSIS — E782 Mixed hyperlipidemia: Secondary | ICD-10-CM | POA: Diagnosis present

## 2012-12-30 DIAGNOSIS — Z9861 Coronary angioplasty status: Secondary | ICD-10-CM

## 2012-12-30 DIAGNOSIS — I462 Cardiac arrest due to underlying cardiac condition: Secondary | ICD-10-CM

## 2012-12-30 DIAGNOSIS — I70209 Unspecified atherosclerosis of native arteries of extremities, unspecified extremity: Secondary | ICD-10-CM | POA: Diagnosis present

## 2012-12-30 DIAGNOSIS — J9601 Acute respiratory failure with hypoxia: Secondary | ICD-10-CM | POA: Diagnosis present

## 2012-12-30 DIAGNOSIS — I4891 Unspecified atrial fibrillation: Secondary | ICD-10-CM | POA: Diagnosis not present

## 2012-12-30 DIAGNOSIS — Z832 Family history of diseases of the blood and blood-forming organs and certain disorders involving the immune mechanism: Secondary | ICD-10-CM

## 2012-12-30 DIAGNOSIS — R570 Cardiogenic shock: Secondary | ICD-10-CM | POA: Diagnosis present

## 2012-12-30 DIAGNOSIS — R945 Abnormal results of liver function studies: Secondary | ICD-10-CM

## 2012-12-30 DIAGNOSIS — I129 Hypertensive chronic kidney disease with stage 1 through stage 4 chronic kidney disease, or unspecified chronic kidney disease: Secondary | ICD-10-CM | POA: Diagnosis present

## 2012-12-30 DIAGNOSIS — I2510 Atherosclerotic heart disease of native coronary artery without angina pectoris: Secondary | ICD-10-CM | POA: Diagnosis present

## 2012-12-30 DIAGNOSIS — G931 Anoxic brain damage, not elsewhere classified: Secondary | ICD-10-CM | POA: Diagnosis present

## 2012-12-30 DIAGNOSIS — R7989 Other specified abnormal findings of blood chemistry: Secondary | ICD-10-CM | POA: Diagnosis present

## 2012-12-30 DIAGNOSIS — R4189 Other symptoms and signs involving cognitive functions and awareness: Secondary | ICD-10-CM

## 2012-12-30 DIAGNOSIS — Z87891 Personal history of nicotine dependence: Secondary | ICD-10-CM

## 2012-12-30 DIAGNOSIS — I2109 ST elevation (STEMI) myocardial infarction involving other coronary artery of anterior wall: Principal | ICD-10-CM | POA: Diagnosis present

## 2012-12-30 DIAGNOSIS — I255 Ischemic cardiomyopathy: Secondary | ICD-10-CM

## 2012-12-30 DIAGNOSIS — N189 Chronic kidney disease, unspecified: Secondary | ICD-10-CM

## 2012-12-30 DIAGNOSIS — Y840 Cardiac catheterization as the cause of abnormal reaction of the patient, or of later complication, without mention of misadventure at the time of the procedure: Secondary | ICD-10-CM | POA: Diagnosis present

## 2012-12-30 DIAGNOSIS — T82897A Other specified complication of cardiac prosthetic devices, implants and grafts, initial encounter: Secondary | ICD-10-CM | POA: Diagnosis present

## 2012-12-30 DIAGNOSIS — I2589 Other forms of chronic ischemic heart disease: Secondary | ICD-10-CM | POA: Diagnosis present

## 2012-12-30 DIAGNOSIS — Z79899 Other long term (current) drug therapy: Secondary | ICD-10-CM

## 2012-12-30 DIAGNOSIS — E785 Hyperlipidemia, unspecified: Secondary | ICD-10-CM | POA: Diagnosis present

## 2012-12-30 DIAGNOSIS — Z8674 Personal history of sudden cardiac arrest: Secondary | ICD-10-CM

## 2012-12-30 DIAGNOSIS — E873 Alkalosis: Secondary | ICD-10-CM | POA: Diagnosis not present

## 2012-12-30 DIAGNOSIS — R0902 Hypoxemia: Secondary | ICD-10-CM | POA: Diagnosis present

## 2012-12-30 DIAGNOSIS — I5021 Acute systolic (congestive) heart failure: Secondary | ICD-10-CM

## 2012-12-30 DIAGNOSIS — Z7902 Long term (current) use of antithrombotics/antiplatelets: Secondary | ICD-10-CM

## 2012-12-30 DIAGNOSIS — E119 Type 2 diabetes mellitus without complications: Secondary | ICD-10-CM | POA: Diagnosis present

## 2012-12-30 DIAGNOSIS — F1210 Cannabis abuse, uncomplicated: Secondary | ICD-10-CM | POA: Diagnosis present

## 2012-12-30 HISTORY — DX: Ischemic cardiomyopathy: I25.5

## 2012-12-30 HISTORY — PX: LEFT HEART CATH: SHX5478

## 2012-12-30 LAB — POCT I-STAT, CHEM 8
Calcium, Ion: 1.76 mmol/L (ref 1.13–1.30)
Chloride: 107 mEq/L (ref 96–112)
Creatinine, Ser: 1.7 mg/dL — ABNORMAL HIGH (ref 0.50–1.35)
Glucose, Bld: 286 mg/dL — ABNORMAL HIGH (ref 70–99)
HCT: 44 % (ref 39.0–52.0)

## 2012-12-30 LAB — POCT I-STAT 3, ART BLOOD GAS (G3+)
Acid-base deficit: 19 mmol/L — ABNORMAL HIGH (ref 0.0–2.0)
O2 Saturation: 94 %
TCO2: 15 mmol/L (ref 0–100)
pO2, Arterial: 108 mmHg — ABNORMAL HIGH (ref 80.0–100.0)

## 2012-12-30 LAB — PROTIME-INR
INR: 2.72 — ABNORMAL HIGH (ref 0.00–1.49)
Prothrombin Time: 27.5 seconds — ABNORMAL HIGH (ref 11.6–15.2)

## 2012-12-30 LAB — COMPREHENSIVE METABOLIC PANEL
ALT: 109 U/L — ABNORMAL HIGH (ref 0–53)
Albumin: 2.3 g/dL — ABNORMAL LOW (ref 3.5–5.2)
Alkaline Phosphatase: 135 U/L — ABNORMAL HIGH (ref 39–117)
Potassium: 3.9 mEq/L (ref 3.5–5.1)
Sodium: 142 mEq/L (ref 135–145)
Total Protein: 5.2 g/dL — ABNORMAL LOW (ref 6.0–8.3)

## 2012-12-30 LAB — POCT I-STAT TROPONIN I

## 2012-12-30 SURGERY — LEFT HEART CATH
Anesthesia: LOCAL

## 2012-12-30 MED ORDER — ATROPINE SULFATE 1 MG/ML IJ SOLN
INTRAMUSCULAR | Status: AC | PRN
  Administered 2012-12-30: 1 mg via INTRAVENOUS

## 2012-12-30 MED ORDER — MIDAZOLAM HCL 2 MG/2ML IJ SOLN
INTRAMUSCULAR | Status: AC
  Administered 2012-12-30: 2 mg via INTRAVENOUS
  Filled 2012-12-30: qty 2

## 2012-12-30 MED ORDER — FENTANYL CITRATE 0.05 MG/ML IJ SOLN
INTRAMUSCULAR | Status: AC
  Administered 2012-12-30: 100 ug via INTRAVENOUS
  Filled 2012-12-30: qty 2

## 2012-12-30 MED ORDER — EPINEPHRINE HCL 1 MG/ML IJ SOLN
0.5000 ug/min | INTRAVENOUS | Status: DC
  Filled 2012-12-30: qty 1

## 2012-12-30 MED ORDER — HEPARIN (PORCINE) IN NACL 2-0.9 UNIT/ML-% IJ SOLN
INTRAMUSCULAR | Status: AC
  Filled 2012-12-30: qty 2000

## 2012-12-30 MED ORDER — LIDOCAINE HCL (PF) 1 % IJ SOLN
INTRAMUSCULAR | Status: AC
  Filled 2012-12-30: qty 30

## 2012-12-30 MED ORDER — SODIUM BICARBONATE 8.4 % IV SOLN
INTRAVENOUS | Status: AC | PRN
  Administered 2012-12-30 (×3): 50 meq via INTRAVENOUS

## 2012-12-30 MED ORDER — EPINEPHRINE HCL 0.1 MG/ML IJ SOLN
INTRAMUSCULAR | Status: AC | PRN
  Administered 2012-12-30 (×2): 1 via INTRAVENOUS

## 2012-12-30 MED ORDER — LIDOCAINE HCL (CARDIAC) 20 MG/ML IV SOLN
INTRAVENOUS | Status: AC | PRN
  Administered 2012-12-30: 100 mg via INTRAVENOUS

## 2012-12-30 MED ORDER — MIDAZOLAM HCL 2 MG/2ML IJ SOLN: 2.0000 mg | Freq: Once | INTRAMUSCULAR | Status: AC

## 2012-12-30 MED ORDER — SODIUM BICARBONATE 8.4 % IV SOLN
INTRAVENOUS | Status: DC
  Administered 2012-12-31 – 2013-01-01 (×4): via INTRAVENOUS
  Filled 2012-12-30 (×14): qty 150

## 2012-12-30 MED ORDER — FENTANYL CITRATE 0.05 MG/ML IJ SOLN: 100.0000 ug | Freq: Once | INTRAMUSCULAR | Status: AC

## 2012-12-30 MED ORDER — MAGNESIUM SULFATE 50 % IJ SOLN
INTRAMUSCULAR | Status: AC | PRN
  Administered 2012-12-30 (×2): 2 g via INTRAVENOUS

## 2012-12-30 MED ORDER — PROCAINAMIDE HCL 100 MG/ML IJ SOLN
1000.0000 mg | Freq: Once | INTRAVENOUS | Status: DC
  Filled 2012-12-30: qty 10

## 2012-12-30 MED ORDER — CALCIUM CHLORIDE 10 % IV SOLN
INTRAVENOUS | Status: AC | PRN
  Administered 2012-12-30 (×3): 1 g via INTRAVENOUS

## 2012-12-30 NOTE — ED Provider Notes (Signed)
History     CSN: [id number]  Arrival date & time 12/30/12  2248   First MD Initiated Contact with Patient 12/30/12 2258      Chief Complaint  Patient presents with  . Cardiac Arrest    (Consider location/radiation/quality/duration/timing/severity/associated sxs/prior treatment) HPI Comments: 15 y M with PMH of HTN, HLD, CKD, CVA, prior V fib arrest brought by EMS after a witnessed arrest at home.  CPR initiated by family and continued by EMS.  They reports at least 10 rounds of epi and at least 6 cardioversions with refractory V fib.  Patient is a 72 y.o. male presenting with altered mental status. The history is provided by the EMS personnel.  Altered Mental Status This is a new problem. The current episode started today. The problem occurs constantly. The problem has been unchanged. Treatments tried: cardioversion, epinephrine, King Airway.    Past Medical History  Diagnosis Date  . Inguinal hernia without mention of obstruction or gangrene, unilateral or unspecified, (not specified as recurrent)   . 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 Peripheral vascular disease, unspecified   . Carotid stenosis     dopplers 1/11: 60-79% bilat.  . Hyperlipidemia     mixed  . Hypertension     a. echo 4/12: EF 50%, asymmetric septal hypertrophy, no SAM or LVOT gradient, LAE, PASP 35  . Coronary artery disease     a. s/p aborted ant STEMI tx with Cypher DES to LAD 10/04 (residual at cath: D1 50%, CFX 40% and multiple dist 70%, EF 55%);   b. myoview 3/10: Ef 47%, infero-apical isch, LOW RISK - med Tx recommended  . CKD (chronic kidney disease)   . Paroxysmal atrial fibrillation 12/2010  . Diverticulosis     2001  . Diabetes mellitus   . H/O: CVA (cardiovascular accident)   . Coronary artery disease     a. s/p aborted ant STEMI s/p DES to LAD 06/2003.  Marland Kitchen Hypertension   . Glucose intolerance (impaired glucose tolerance)   . Pneumonia   . Carotid artery  stenosis     01/2011 - 40-59% bilateral stenosis  . Peripheral vascular disease     h/o LE angioplasty  . 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 Cardiomyopathy     a. EF 50-55% by echo 03/09/12 (was 30% by echo 02/24/12)  . Chronic renal insufficiency   . Paroxysmal atrial fibrillation   . Mitral regurgitation     Mild by echo 03/2012  . Hematemesis     12/2010 felt 2/2 Mallory Weiss tear - pt could not afford colonscopy/EGD at that time so Coumadin was deferred. Coumadin initiated 03/2012 without any evidence for bleeding.  Marland Kitchen Hyperlipidemia   . Inguinal hernia   . Diverticulosis 2001  . QT prolongation   . Bradycardia     Requiring discontinuation of use of BB/CCB  . Elevated LFTs     Shock liver 03/2012    Past Surgical History  Procedure Laterality Date  . Popliteal artery angioplasty  01/07/07    s/p LEFT POPLITEAL ARTERY EXPLORATION AND VEIN PATCH ANGIOPLASTY, PER DR. EARLY, SECONDARY TO ISCHEMIC LEFT FOOT RELATED TO LEFT POPLITEAL ARTERY INJURY  . Orif tibia & fibula fractures  01/11/07    OPEN TX OF UNICONDYLAR PLATEAU FRACTURE, IRRIGATION/DEBRIDEMENT OF OPEN FRACTURE INCLUDING BONE, REMOVAL OF EXTERNAL FIXATOR UNDER ANESTHESIA  PER DR. MICHAEL HANDY  . Hernia repair    . Skin graft      Family History  Problem Relation Age of Onset  . Heart disease Father     ALSO UNCLE DIED HAD CAD  . Brain cancer Mother   . Leukemia Mother   . Sickle cell anemia Mother   . Heart failure Father   . Sickle cell anemia Other     History  Substance Use Topics  . Smoking status: Former Smoker -- 0.50 packs/day for 30 years    Types: Cigarettes    Quit date: 03/12/2012  . Smokeless tobacco: Not on file  . Alcohol Use: No      Review of Systems  Unable to perform ROS: Patient unresponsive  Psychiatric/Behavioral:  Positive for altered mental status.    Allergies  Review of patient's allergies indicates no known allergies.  Home Medications   Current Outpatient Rx  Name  Route  Sig  Dispense  Refill  . metoprolol succinate (TOPROL-XL) 50 MG 24 hr tablet   Oral   Take 1 tablet (50 mg total) by mouth daily.   30 tablet   12   . nitroGLYCERIN (NITROSTAT) 0.4 MG SL tablet   Sublingual   Place 1 tablet (0.4 mg total) under the tongue every 5 (five) minutes as needed for chest pain (Up to 3 doses).   25 tablet   4   . potassium chloride SA (K-DUR,KLOR-CON) 20 MEQ tablet      one tablet daily         . spironolactone (ALDACTONE) 25 MG tablet      TAKE ONE TABLET BY MOUTH DAILY   30 tablet   5   . warfarin (COUMADIN) 2.5 MG tablet   Oral   Take 1 tablet (2.5 mg total) by mouth as directed.   35 tablet   2    No vital signs obtainable on arrival  Physical Exam  Vitals reviewed. Constitutional: He appears distressed.  unresponsive  HENT:  Head: Normocephalic.  Right Ear: External ear normal.  Left Ear: External ear normal.  Nose: Nose normal.  Mouth/Throat: Oropharynx is clear and moist.  King Airway in place  Eyes: Conjunctivae are normal. Pupils are equal, round, and reactive to light.  Pupils unreactive  Neck: Neck supple. No tracheal deviation present.  Cardiovascular:  Pulseless, no cardiac activity  Pulmonary/Chest:  rhonchorous BS bilaterally with [place]way, no spontaneous respirations  Abdominal: Soft. He exhibits no distension.  Genitourinary: Penis normal.  Musculoskeletal: He exhibits no edema.  Neurological:  unresponsive  Skin: Skin is dry. No rash noted.  cool  Psychiatric: He has a normal mood and affect.    ED Course  CARDIOVERSION Performed by: Madaline Brilliant Authorized by: Dorie Rank R Consent: The procedure was performed in an emergent situation. Cardioversion basis: emergent Pre-procedure rhythm: ventricular fibrillation (V tach) Patient  position: patient was placed in a supine position Chest area: chest area exposed Electrodes: pads Electrodes placed: anterior-posterior Number of attempts: 9. Post-procedure rhythm: junctional rhythm  INTUBATION Performed by: Madaline Brilliant Authorized by: Dorie Rank R Consent: The procedure was performed in an emergent situation. Patient identity confirmed: anonymous protocol, patient vented/unresponsive Indications: airway protection and respiratory failure Intubation method: video-assisted Patient status: unconscious Preoxygenation: [place] Airway. Laryngoscope size: Mac 4 Tube size: 7.5 mm Tube type: cuffed Number of attempts: 1 Cords visualized: yes Post-procedure assessment: chest rise,  ETCO2 monitor and CO2 detector Breath sounds: equal Cuff inflated: yes Tube secured  with: ETT holder Chest x-ray interpreted by me. Chest x-ray findings: endotracheal tube in appropriate position Patient tolerance: Patient tolerated the procedure well with no immediate complications.  CENTRAL LINE Performed by: Madaline Brilliant Authorized by: Dorie Rank R Consent: The procedure was performed in an emergent situation. Patient identity confirmed: anonymous protocol, patient vented/unresponsive Indications: vascular access Preparation: skin prepped with 2% chlorhexidine Skin prep agent dried: skin prep agent completely dried prior to procedure Sterile barriers: all five maximum sterile barriers used - cap, mask, sterile gown, sterile gloves, and large sterile sheet Hand hygiene: hand hygiene performed prior to central venous catheter insertion Location details: left femoral Site selection rationale: Wanted to avoid area of chest if pulses were lost again Patient position: flat Catheter type: triple lumen Catheter size: 7 Fr Pre-procedure: landmarks identified Ultrasound guidance: yes Number of attempts: 1 Successful placement: yes Post-procedure: line sutured and dressing  applied Assessment: blood return through all ports and free fluid flow Patient tolerance: Patient tolerated the procedure well with no immediate complications.   (including critical care time)  Labs Reviewed  PROTIME-INR - Abnormal; Notable for the following:    Prothrombin Time 27.5 (*)    INR 2.72 (*)    All other components within normal limits  APTT - Abnormal; Notable for the following:    aPTT 54 (*)    All other components within normal limits  COMPREHENSIVE METABOLIC PANEL - Abnormal; Notable for the following:    CO2 18 (*)    Glucose, Bld 307 (*)    Creatinine, Ser 1.78 (*)    Calcium 13.4 (*)    Total Protein 5.2 (*)    Albumin 2.3 (*)    AST 135 (*)    ALT 109 (*)    Alkaline Phosphatase 135 (*)    Total Bilirubin 0.2 (*)    GFR calc non Af Amer 37 (*)    GFR calc Af Amer 43 (*)    All other components within normal limits  CBC WITH DIFFERENTIAL - Abnormal; Notable for the following:    MCV 77.5 (*)    MCHC 36.3 (*)    Platelets 101 (*)    Neutrophils Relative 27 (*)    Lymphocytes Relative 66 (*)    Lymphs Abs 5.9 (*)    All other components within normal limits  TROPONIN I - Abnormal; Notable for the following:    Troponin I 0.35 (*)    All other components within normal limits  POCT I-STAT, CHEM 8 - Abnormal; Notable for the following:    Creatinine, Ser 1.70 (*)    Glucose, Bld 286 (*)    Calcium, Ion 1.76 (*)    All other components within normal limits  POCT I-STAT 3, BLOOD GAS (G3+) - Abnormal; Notable for the following:    pH, Arterial 6.977 (*)    pCO2 arterial 56.8 (*)    pO2, Arterial 108.0 (*)    Bicarbonate 13.3 (*)    Acid-base deficit 19.0 (*)    All other components within normal limits  POCT I-STAT TROPONIN I - Abnormal; Notable for the following:    Troponin i, poc 0.09 (*)    All other components within normal limits  POCT I-STAT 3, BLOOD GAS (G3+) - Abnormal; Notable for the following:    pH, Arterial 7.296 (*)    pCO2 arterial  33.9 (*)    pO2, Arterial 102.0 (*)    Bicarbonate 16.5 (*)    Acid-base deficit 9.0 (*)  All other components within normal limits  BLOOD GAS, ARTERIAL  POCT ACTIVATED CLOTTING TIME   Dg Chest Portable 1 View  12/31/2012  *RADIOLOGY REPORT*  Clinical Data: Cardiac arrest.  PORTABLE CHEST - 1 VIEW  Comparison: 11/25/2011.  Findings: Endotracheal tube tip is 65 mm from the carina.  Enteric tube is present with the tip not visualized.  Defibrillator pads overlie the chest. Cardiopericardial silhouette is enlarged. Diffuse airspace disease is present compatible with cardiogenic pulmonary edema.  Both interstitial and alveolar edema present. There is no pneumothorax identified.  IMPRESSION:  1.  Support apparatus as described above. 2.  Cardiomegaly and moderate pulmonary edema.   Original Report Authenticated By: Dereck Ligas, M.D.      1. Cardiac arrest   2. Unresponsive state   3. Required emergent intubaiton 4. Hyperglycemia 5. Trasaminitis 6. Acute Kidney Injury    MDM   70 y M with PMH of HTN, HLD, CKD, CVA, prior V fib arrest brought by EMS after a witnessed arrest at home.  CPR initiated by family and continued by EMS.  They reports at least 10 rounds of epi and at least 6 cardioversions with refractory V fib.  He arrived pulseless and unresponsive with no cardiac activity on U/S bedside. Over the course of his resuscitation he received compressions by auto device, 7 defib/cardioversions for Vfib and Vtach, 2 rounds of 2 g magnesium, 3 rounds of CaCl, 3 rounds of bicarb, 1 round of atropine and 100 mg Lidocaine.  After ROSC, he was intubated using the Glidescope - Grade 1 view but stomach contents being regurgitated prior to and during intubation.  L femoral CVL placed by myself.  Radial a line placed by RT.  Critical Care and Cardiology were consulted early in his resuscitation.  Hypothermia protocol initiated.  He was taken emergently to the cath lab with cardiology.  Disposition:  Admit  Condition: Critical  Pt seen in conjunction with my attending, Dr. Dorie Rank.  Madaline Brilliant, MD PGY-II [facility] Emergency Medicine Resident         Madaline Brilliant, MD 12/31/12 [phone]

## 2012-12-30 NOTE — ED Notes (Signed)
Patient arrived via [facility] in cardiac arrest, Linton Rump performing CPR.  Patient was a witnessed arrest by family, CPR initiated by family.  Hx of MI in the past.  Patient with 79min down time, patient in refractory VT/VF upon arrival to ED.

## 2012-12-30 NOTE — ED Notes (Signed)
Pulses regained at this time.

## 2012-12-30 NOTE — ED Notes (Signed)
Cold saline started at this time.

## 2012-12-30 NOTE — Progress Notes (Signed)
Patient transported to cath-lab on vent and arctic sun
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
## 2012-12-30 NOTE — Procedures (Signed)
Arterial Catheter Insertion Procedure Note Logan White [id number] 08-22-1941  Procedure: Insertion of Arterial Catheter  Indications: Blood pressure monitoring and Frequent blood sampling  Procedure Details Consent: Risks of procedure as well as the alternatives and risks of each were explained to the (patient/caregiver).  Consent for procedure obtained. and Unable to obtain consent because of emergent medical necessity. Time Out: Verified patient identification, verified procedure, site/side was marked, verified correct patient position, special equipment/implants available, medications/allergies/relevent history reviewed, required imaging and test results available.  Performed  Maximum sterile technique was used including gloves, gown, hand hygiene and mask. Skin prep: Chlorhexidine; local anesthetic administered 20 gauge catheter was inserted into left radial artery using the Seldinger technique.  Evaluation Blood flow poor; BP tracing good. Complications: No apparent complications. Patient started on artic sun protocol. Arterial line placed due to hypotension    [name] 12/30/2012

## 2012-12-31 ENCOUNTER — Inpatient Hospital Stay (HOSPITAL_COMMUNITY): Payer: Medicare Other

## 2012-12-31 DIAGNOSIS — I059 Rheumatic mitral valve disease, unspecified: Secondary | ICD-10-CM

## 2012-12-31 DIAGNOSIS — N289 Disorder of kidney and ureter, unspecified: Secondary | ICD-10-CM | POA: Insufficient documentation

## 2012-12-31 DIAGNOSIS — I469 Cardiac arrest, cause unspecified: Secondary | ICD-10-CM

## 2012-12-31 DIAGNOSIS — I462 Cardiac arrest due to underlying cardiac condition: Secondary | ICD-10-CM | POA: Diagnosis present

## 2012-12-31 DIAGNOSIS — Z7901 Long term (current) use of anticoagulants: Secondary | ICD-10-CM

## 2012-12-31 DIAGNOSIS — G931 Anoxic brain damage, not elsewhere classified: Secondary | ICD-10-CM

## 2012-12-31 DIAGNOSIS — E876 Hypokalemia: Secondary | ICD-10-CM | POA: Diagnosis present

## 2012-12-31 DIAGNOSIS — J9600 Acute respiratory failure, unspecified whether with hypoxia or hypercapnia: Secondary | ICD-10-CM

## 2012-12-31 DIAGNOSIS — I2109 ST elevation (STEMI) myocardial infarction involving other coronary artery of anterior wall: Secondary | ICD-10-CM | POA: Diagnosis present

## 2012-12-31 DIAGNOSIS — Z9911 Dependence on respirator [ventilator] status: Secondary | ICD-10-CM

## 2012-12-31 DIAGNOSIS — I4901 Ventricular fibrillation: Secondary | ICD-10-CM | POA: Diagnosis present

## 2012-12-31 DIAGNOSIS — N189 Chronic kidney disease, unspecified: Secondary | ICD-10-CM | POA: Insufficient documentation

## 2012-12-31 DIAGNOSIS — R945 Abnormal results of liver function studies: Secondary | ICD-10-CM | POA: Diagnosis present

## 2012-12-31 DIAGNOSIS — R570 Cardiogenic shock: Secondary | ICD-10-CM

## 2012-12-31 LAB — GLUCOSE, CAPILLARY
Glucose-Capillary: 187 mg/dL — ABNORMAL HIGH (ref 70–99)
Glucose-Capillary: 237 mg/dL — ABNORMAL HIGH (ref 70–99)
Glucose-Capillary: 280 mg/dL — ABNORMAL HIGH (ref 70–99)
Glucose-Capillary: 247 mg/dL — ABNORMAL HIGH (ref 70–99)
Glucose-Capillary: 183 mg/dL — ABNORMAL HIGH (ref 70–99)
Glucose-Capillary: 175 mg/dL — ABNORMAL HIGH (ref 70–99)
Glucose-Capillary: 159 mg/dL — ABNORMAL HIGH (ref 70–99)
Glucose-Capillary: 138 mg/dL — ABNORMAL HIGH (ref 70–99)
Glucose-Capillary: 128 mg/dL — ABNORMAL HIGH (ref 70–99)

## 2012-12-31 LAB — MRSA PCR SCREENING: MRSA by PCR: POSITIVE — AB

## 2012-12-31 LAB — COMPREHENSIVE METABOLIC PANEL
ALT: 154 U/L — ABNORMAL HIGH (ref 0–53)
AST: 282 U/L — ABNORMAL HIGH (ref 0–37)
Alkaline Phosphatase: 195 U/L — ABNORMAL HIGH (ref 39–117)
CO2: 15 mEq/L — ABNORMAL LOW (ref 19–32)
Calcium: 9.9 mg/dL (ref 8.4–10.5)
GFR calc Af Amer: 37 mL/min — ABNORMAL LOW (ref >90)
GFR calc non Af Amer: 32 mL/min — ABNORMAL LOW (ref >90)
Glucose, Bld: 240 mg/dL — ABNORMAL HIGH (ref 70–99)
Potassium: 3.9 mEq/L (ref 3.5–5.1)
Sodium: 135 mEq/L (ref 135–145)

## 2012-12-31 LAB — POCT I-STAT, CHEM 8
Calcium, Ion: 1.34 mmol/L — ABNORMAL HIGH (ref 1.13–1.30)
Chloride: 111 mEq/L (ref 96–112)
Creatinine, Ser: 2.3 mg/dL — ABNORMAL HIGH (ref 0.50–1.35)
Glucose, Bld: 265 mg/dL — ABNORMAL HIGH (ref 70–99)
Potassium: 2.9 mEq/L — ABNORMAL LOW (ref 3.5–5.1)

## 2012-12-31 LAB — BASIC METABOLIC PANEL
BUN: 25 mg/dL — ABNORMAL HIGH (ref 6–23)
BUN: 28 mg/dL — ABNORMAL HIGH (ref 6–23)
BUN: 32 mg/dL — ABNORMAL HIGH (ref 6–23)
CO2: 14 mEq/L — ABNORMAL LOW (ref 19–32)
CO2: 16 mEq/L — ABNORMAL LOW (ref 19–32)
Calcium: 8.8 mg/dL (ref 8.4–10.5)
Chloride: 104 mEq/L (ref 96–112)
Chloride: 103 mEq/L (ref 96–112)
Chloride: 105 mEq/L (ref 96–112)
Chloride: 105 mEq/L (ref 96–112)
Creatinine, Ser: 2.17 mg/dL — ABNORMAL HIGH (ref 0.50–1.35)
Creatinine, Ser: 2.18 mg/dL — ABNORMAL HIGH (ref 0.50–1.35)
GFR calc Af Amer: 32 mL/min — ABNORMAL LOW (ref >90)
GFR calc Af Amer: 34 mL/min — ABNORMAL LOW (ref >90)
GFR calc non Af Amer: 28 mL/min — ABNORMAL LOW (ref >90)
Glucose, Bld: 273 mg/dL — ABNORMAL HIGH (ref 70–99)
Glucose, Bld: 295 mg/dL — ABNORMAL HIGH (ref 70–99)
Potassium: 3 mEq/L — ABNORMAL LOW (ref 3.5–5.1)
Potassium: 2.7 mEq/L — CL (ref 3.5–5.1)
Potassium: 2.9 mEq/L — ABNORMAL LOW (ref 3.5–5.1)
Sodium: 135 mEq/L (ref 135–145)
Sodium: 138 mEq/L (ref 135–145)
Sodium: 139 mEq/L (ref 135–145)

## 2012-12-31 LAB — CBC WITH DIFFERENTIAL/PLATELET
Basophils Absolute: 0 10*3/uL (ref 0.0–0.1)
Basophils Relative: 0 % (ref 0–1)
Eosinophils Relative: 2 % (ref 0–5)
HCT: 39.9 % (ref 39.0–52.0)
Lymphocytes Relative: 66 % — ABNORMAL HIGH (ref 12–46)
MCHC: 36.3 g/dL — ABNORMAL HIGH (ref 30.0–36.0)
MCV: 77.5 fL — ABNORMAL LOW (ref 78.0–100.0)
Monocytes Absolute: 0.5 10*3/uL (ref 0.1–1.0)
RDW: 14 % (ref 11.5–15.5)

## 2012-12-31 LAB — CBC
HCT: 43.4 % (ref 39.0–52.0)
MCH: 28.5 pg (ref 26.0–34.0)
MCV: 74.1 fL — ABNORMAL LOW (ref 78.0–100.0)
Platelets: 207 10*3/uL (ref 150–400)
RBC: 5.86 MIL/uL — ABNORMAL HIGH (ref 4.22–5.81)
RDW: 13.9 % (ref 11.5–15.5)
WBC: 14.2 10*3/uL — ABNORMAL HIGH (ref 4.0–10.5)

## 2012-12-31 LAB — POCT I-STAT 3, ART BLOOD GAS (G3+)
Acid-base deficit: 10 mmol/L — ABNORMAL HIGH (ref 0.0–2.0)
Bicarbonate: 16.5 mEq/L — ABNORMAL LOW (ref 20.0–24.0)
Bicarbonate: 14.9 mEq/L — ABNORMAL LOW (ref 20.0–24.0)
TCO2: 18 mmol/L (ref 0–100)
TCO2: 16 mmol/L (ref 0–100)
pCO2 arterial: 33.9 mmHg — ABNORMAL LOW (ref 35.0–45.0)
pH, Arterial: 7.296 — ABNORMAL LOW (ref 7.350–7.450)
pO2, Arterial: 102 mmHg — ABNORMAL HIGH (ref 80.0–100.0)
pO2, Arterial: 112 mmHg — ABNORMAL HIGH (ref 80.0–100.0)

## 2012-12-31 LAB — HEMOGLOBIN A1C: Mean Plasma Glucose: 126 mg/dL — ABNORMAL HIGH (ref <117)

## 2012-12-31 LAB — LACTIC ACID, PLASMA: Lactic Acid, Venous: 5.8 mmol/L — ABNORMAL HIGH (ref 0.5–2.2)

## 2012-12-31 LAB — PROTIME-INR
INR: 3.62 — ABNORMAL HIGH (ref 0.00–1.49)
Prothrombin Time: 34 seconds — ABNORMAL HIGH (ref 11.6–15.2)

## 2012-12-31 LAB — POCT ACTIVATED CLOTTING TIME: Activated Clotting Time: 557 seconds

## 2012-12-31 LAB — TROPONIN I: Troponin I: 19.3 ng/mL (ref <0.30)

## 2012-12-31 MED ORDER — CISATRACURIUM BOLUS VIA INFUSION
0.1000 mg/kg | Freq: Once | INTRAVENOUS | Status: AC
  Filled 2012-12-31: qty 9

## 2012-12-31 MED ORDER — PANTOPRAZOLE SODIUM 40 MG IV SOLR
40.0000 mg | INTRAVENOUS | Status: DC
  Administered 2012-12-31: 40 mg via INTRAVENOUS
  Filled 2012-12-31 (×2): qty 40

## 2012-12-31 MED ORDER — CLOPIDOGREL BISULFATE 300 MG PO TABS
300.0000 mg | ORAL_TABLET | Freq: Once | ORAL | Status: AC
  Administered 2012-12-31: 300 mg via NASOGASTRIC
  Filled 2012-12-31: qty 1

## 2012-12-31 MED ORDER — BIVALIRUDIN 250 MG IV SOLR
INTRAVENOUS | Status: AC
  Filled 2012-12-31: qty 250

## 2012-12-31 MED ORDER — FENTANYL CITRATE 0.05 MG/ML IJ SOLN: 100.0000 ug | Freq: Once | INTRAMUSCULAR | Status: DC

## 2012-12-31 MED ORDER — NOREPINEPHRINE BITARTRATE 1 MG/ML IJ SOLN
2.0000 ug/min | INTRAVENOUS | Status: AC
  Administered 2012-12-31: 4 ug/min via INTRAVENOUS

## 2012-12-31 MED ORDER — POTASSIUM CHLORIDE 10 MEQ/50ML IV SOLN
10.0000 meq | INTRAVENOUS | Status: AC
  Administered 2012-12-31 (×3): 10 meq via INTRAVENOUS
  Filled 2012-12-31: qty 150

## 2012-12-31 MED ORDER — CLOPIDOGREL BISULFATE 75 MG PO TABS
75.0000 mg | ORAL_TABLET | Freq: Every day | ORAL | Status: DC
  Administered 2013-01-01 – 2013-01-11 (×10): 75 mg via NASOGASTRIC
  Filled 2012-12-31 (×14): qty 1

## 2012-12-31 MED ORDER — DOPAMINE-DEXTROSE 3.2-5 MG/ML-% IV SOLN
INTRAVENOUS | Status: AC
  Filled 2012-12-31: qty 250

## 2012-12-31 MED ORDER — LIDOCAINE BOLUS VIA INFUSION
75.0000 mg | Freq: Once | INTRAVENOUS | Status: AC
  Administered 2012-12-31: 75 mg via INTRAVENOUS
  Filled 2012-12-31: qty 76

## 2012-12-31 MED ORDER — VITAMIN K1 10 MG/ML IJ SOLN
1.0000 mg | Freq: Once | INTRAVENOUS | Status: AC
  Administered 2012-12-31: 1 mg via INTRAVENOUS
  Filled 2012-12-31: qty 0.1

## 2012-12-31 MED ORDER — ASPIRIN 81 MG PO CHEW
81.0000 mg | CHEWABLE_TABLET | Freq: Every day | ORAL | Status: DC
  Administered 2013-01-01 – 2013-01-09 (×7): 81 mg via NASOGASTRIC
  Filled 2012-12-31 (×7): qty 1

## 2012-12-31 MED ORDER — CHLORHEXIDINE GLUCONATE 0.12 % MT SOLN
15.0000 mL | Freq: Once | OROMUCOSAL | Status: AC
  Administered 2012-12-31: 15 mL via OROMUCOSAL

## 2012-12-31 MED ORDER — NITROGLYCERIN 1 MG/10 ML FOR IR/CATH LAB
INTRA_ARTERIAL | Status: AC
  Filled 2012-12-31: qty 10

## 2012-12-31 MED ORDER — SODIUM CHLORIDE 0.9 % IV SOLN
1.0000 ug/kg/min | INTRAVENOUS | Status: DC
  Administered 2012-12-31: 1 ug/kg/min via INTRAVENOUS
  Filled 2012-12-31 (×2): qty 20

## 2012-12-31 MED ORDER — CISATRACURIUM BOLUS VIA INFUSION
0.0500 mg/kg | Freq: Once | INTRAVENOUS | Status: AC | PRN
  Filled 2012-12-31: qty 5

## 2012-12-31 MED ORDER — MIDAZOLAM BOLUS VIA INFUSION
2.0000 mg | INTRAVENOUS | Status: DC | PRN
  Filled 2012-12-31: qty 2

## 2012-12-31 MED ORDER — NOREPINEPHRINE BITARTRATE 1 MG/ML IJ SOLN
2.0000 ug/min | INTRAVENOUS | Status: DC
  Filled 2012-12-31: qty 4

## 2012-12-31 MED ORDER — POTASSIUM CHLORIDE 10 MEQ/50ML IV SOLN
INTRAVENOUS | Status: AC
  Administered 2012-12-31: 10 meq via INTRAVENOUS
  Filled 2012-12-31: qty 50

## 2012-12-31 MED ORDER — ASPIRIN 300 MG RE SUPP
300.0000 mg | RECTAL | Status: AC
  Administered 2012-12-31: 300 mg via RECTAL
  Filled 2012-12-31: qty 1

## 2012-12-31 MED ORDER — MIDAZOLAM HCL 2 MG/2ML IJ SOLN: 2.0000 mg | Freq: Once | INTRAMUSCULAR | Status: AC | PRN

## 2012-12-31 MED ORDER — INSULIN GLARGINE 100 UNIT/ML ~~LOC~~ SOLN
20.0000 [IU] | SUBCUTANEOUS | Status: DC
  Administered 2012-12-31 – 2013-01-04 (×5): 20 [IU] via SUBCUTANEOUS
  Filled 2012-12-31 (×7): qty 0.2

## 2012-12-31 MED ORDER — SODIUM CHLORIDE 0.9 % IV SOLN
INTRAVENOUS | Status: DC
  Administered 2012-12-31 – 2013-01-05 (×3): via INTRAVENOUS

## 2012-12-31 MED ORDER — SODIUM CHLORIDE 0.9 % IV SOLN
INTRAVENOUS | Status: DC
  Administered 2012-12-31: 2.3 [IU]/h via INTRAVENOUS
  Filled 2012-12-31 (×2): qty 1

## 2012-12-31 MED ORDER — INSULIN ASPART 100 UNIT/ML ~~LOC~~ SOLN
2.0000 [IU] | SUBCUTANEOUS | Status: DC
  Administered 2013-01-01 (×2): 4 [IU] via SUBCUTANEOUS
  Administered 2013-01-02 – 2013-01-04 (×7): 2 [IU] via SUBCUTANEOUS
  Administered 2013-01-04: 4 [IU] via SUBCUTANEOUS
  Administered 2013-01-04: 2 [IU] via SUBCUTANEOUS
  Administered 2013-01-04 – 2013-01-05 (×2): 4 [IU] via SUBCUTANEOUS

## 2012-12-31 MED ORDER — NOREPINEPHRINE BITARTRATE 1 MG/ML IJ SOLN
2.0000 ug/min | INTRAVENOUS | Status: DC
  Administered 2012-12-31: 10 ug/min via INTRAVENOUS
  Administered 2013-01-01: 25 ug/min via INTRAVENOUS
  Administered 2013-01-02: 28 ug/min via INTRAVENOUS
  Administered 2013-01-02 – 2013-01-03 (×2): 32 ug/min via INTRAVENOUS
  Administered 2013-01-03 (×2): 28 ug/min via INTRAVENOUS
  Administered 2013-01-04: 12 ug/min via INTRAVENOUS
  Filled 2012-12-31 (×8): qty 16

## 2012-12-31 MED ORDER — POTASSIUM CHLORIDE 10 MEQ/50ML IV SOLN
10.0000 meq | INTRAVENOUS | Status: AC
Start: 2012-12-31 — End: 2012-12-31
  Administered 2012-12-31 (×2): 10 meq via INTRAVENOUS
  Filled 2012-12-31: qty 100

## 2012-12-31 MED ORDER — CHLORHEXIDINE GLUCONATE CLOTH 2 % EX PADS
6.0000 | MEDICATED_PAD | Freq: Every day | CUTANEOUS | Status: AC
  Administered 2012-12-31 – 2013-01-04 (×5): 6 via TOPICAL

## 2012-12-31 MED ORDER — ARTIFICIAL TEARS OP OINT
1.0000 "application " | TOPICAL_OINTMENT | Freq: Three times a day (TID) | OPHTHALMIC | Status: DC
  Administered 2012-12-31 – 2013-01-01 (×5): 1 via OPHTHALMIC
  Filled 2012-12-31 (×2): qty 3.5

## 2012-12-31 MED ORDER — LIDOCAINE IN D5W 4-5 MG/ML-% IV SOLN
1.0000 mg/min | INTRAVENOUS | Status: DC
  Administered 2012-12-31: 1 mg/min via INTRAVENOUS
  Filled 2012-12-31: qty 250

## 2012-12-31 MED ORDER — INSULIN ASPART 100 UNIT/ML ~~LOC~~ SOLN
0.0000 [IU] | SUBCUTANEOUS | Status: DC
  Administered 2012-12-31: 3 [IU] via SUBCUTANEOUS

## 2012-12-31 MED ORDER — SODIUM CHLORIDE 0.9 % IV SOLN
INTRAVENOUS | Status: DC
  Administered 2012-12-31: 04:00:00 via INTRAVENOUS

## 2012-12-31 MED ORDER — POTASSIUM CHLORIDE 10 MEQ/50ML IV SOLN
10.0000 meq | INTRAVENOUS | Status: AC
  Administered 2012-12-31 (×3): 10 meq via INTRAVENOUS

## 2012-12-31 MED ORDER — ACETAMINOPHEN 325 MG PO TABS: 650.0000 mg | ORAL_TABLET | ORAL | Status: DC | PRN

## 2012-12-31 MED ORDER — POTASSIUM CHLORIDE 10 MEQ/100ML IV SOLN: 10.0000 meq | INTRAVENOUS | Status: DC

## 2012-12-31 MED ORDER — MUPIROCIN 2 % EX OINT
1.0000 "application " | TOPICAL_OINTMENT | Freq: Two times a day (BID) | CUTANEOUS | Status: AC
  Administered 2012-12-31 – 2013-01-04 (×10): 1 via NASAL
  Filled 2012-12-31: qty 22

## 2012-12-31 MED ORDER — NOREPINEPHRINE BITARTRATE 1 MG/ML IJ SOLN
2.0000 ug/min | INTRAVENOUS | Status: AC
  Administered 2012-12-31: 2 ug/min via INTRAVENOUS

## 2012-12-31 MED ORDER — DOPAMINE-DEXTROSE 3.2-5 MG/ML-% IV SOLN
2.0000 ug/kg/min | INTRAVENOUS | Status: DC
  Administered 2013-01-02: 3 ug/kg/min via INTRAVENOUS
  Filled 2012-12-31: qty 250

## 2012-12-31 MED ORDER — POTASSIUM CHLORIDE 10 MEQ/50ML IV SOLN
INTRAVENOUS | Status: AC
  Filled 2012-12-31: qty 150

## 2012-12-31 MED ORDER — FENTANYL BOLUS VIA INFUSION
50.0000 ug | INTRAVENOUS | Status: DC | PRN
  Filled 2012-12-31: qty 50

## 2012-12-31 MED ORDER — SODIUM CHLORIDE 0.9 % IV SOLN
1.0000 mg/h | INTRAVENOUS | Status: DC
  Administered 2012-12-31: 2 mg/h via INTRAVENOUS
  Administered 2012-12-31: 6 mg/h via INTRAVENOUS
  Administered 2012-12-31: 4 mg/h via INTRAVENOUS
  Administered 2013-01-01 (×2): 6 mg/h via INTRAVENOUS
  Filled 2012-12-31 (×6): qty 10

## 2012-12-31 MED ORDER — SODIUM CHLORIDE 0.9 % IV SOLN: 2000.0000 mL | Freq: Once | INTRAVENOUS | Status: AC

## 2012-12-31 MED ORDER — HEPARIN SODIUM (PORCINE) 5000 UNIT/ML IJ SOLN: 5000.0000 [IU] | Freq: Three times a day (TID) | INTRAMUSCULAR | Status: DC

## 2012-12-31 MED ORDER — SODIUM CHLORIDE 0.9 % IV SOLN
0.2500 mg/kg/h | INTRAVENOUS | Status: AC
  Administered 2012-12-31: 0.25 mg/kg/h via INTRAVENOUS
  Filled 2012-12-31: qty 250

## 2012-12-31 MED ORDER — ONDANSETRON HCL 4 MG/2ML IJ SOLN: 4.0000 mg | Freq: Four times a day (QID) | INTRAMUSCULAR | Status: DC | PRN

## 2012-12-31 MED ORDER — BIOTENE DRY MOUTH MT LIQD
15.0000 mL | Freq: Four times a day (QID) | OROMUCOSAL | Status: DC
  Administered 2012-12-31 – 2013-01-11 (×41): 15 mL via OROMUCOSAL

## 2012-12-31 MED ORDER — CHLORHEXIDINE GLUCONATE 0.12 % MT SOLN
15.0000 mL | Freq: Two times a day (BID) | OROMUCOSAL | Status: DC
  Administered 2012-12-31 – 2013-01-06 (×14): 15 mL via OROMUCOSAL
  Filled 2012-12-31 (×15): qty 15

## 2012-12-31 MED ORDER — SODIUM CHLORIDE 0.9 % IV SOLN
25.0000 ug/h | INTRAVENOUS | Status: DC
  Administered 2012-12-31: 250 ug/h via INTRAVENOUS
  Administered 2012-12-31: 50 ug/h via INTRAVENOUS
  Administered 2013-01-01: 250 ug/h via INTRAVENOUS
  Filled 2012-12-31 (×4): qty 50

## 2012-12-31 MED ORDER — MIDAZOLAM HCL 2 MG/2ML IJ SOLN: 2.0000 mg | Freq: Once | INTRAMUSCULAR | Status: DC

## 2012-12-31 MED FILL — Medication: Qty: 1 | Status: AC

## 2012-12-31 NOTE — Progress Notes (Signed)
Pt transported to ICU from cath lab with no complications.

## 2012-12-31 NOTE — ED Provider Notes (Signed)
I reviewed and interpreted the EKG during the patient's evaluation in the ED and agree with the resident's interpretation. I saw and evaluated the patient, reviewed the resident's note and I agree with the findings and plan.  I was present at the bedside for the entire resuscitation.  Multiple rounds of medications, defibrillation with eventual return of spontaneous circulation.  Cooling initiated. Cardiology and Critical care were consulted and their assistance was appreciated.    I had a long discussion with the family and discussed the critical nature of his illness  Pt admitted up to ICU.  CRITICAL CARE Performed by: Kathalene Frames  ?  Total critical care time: 45 min  Critical care time was exclusive of separately billable procedures and treating other patients.  Critical care was necessary to treat or prevent imminent or life-threatening deterioration.  Critical care was time spent personally by me on the following activities: development of treatment plan with patient and/or surrogate as well as nursing, discussions with consultants, evaluation of patient's response to treatment, examination of patient, obtaining history from patient or surrogate, ordering and performing treatments and interventions, ordering and review of laboratory studies, ordering and review of radiographic studies, pulse oximetry and re-evaluation of patient's condition.   Kathalene Frames, MD 12/31/12 1640

## 2012-12-31 NOTE — Progress Notes (Addendum)
PULMONARY  / CRITICAL CARE MEDICINE  Name: Logan White MRN: [id number] DOB: 22-Sep-1940    ADMISSION DATE:  12/30/2012  REFERRING MD :  Dr. Tomi Bamberger PRIMARY SERVICE: PCCM  CHIEF COMPLAINT:  Cardiac arrest  BRIEF PATIENT DESCRIPTION:  Logan White is a 72 year old man with PMH of ischemic cardiomyopathy, A.fib on warfarin, CVA, DM2, CKD, HTN, and previous cardiac arrest who presented to the [facility] via EMS with VF/VT arrest with possible STEMI, gained pulse, started on [facility] protocol to cardiac cath.   SIGNIFICANT EVENTS / STUDIES:  4/27 VT/VT Cardiac arrest with ~30 minutes of CPR  4/27 Initiation of [facility] protocol 4/27 CXR cardiomegaly with mild pulmonary edema 4/28 To cath lab for eval of coronary anatomy  LINES / TUBES: Left femoral CL 4/27>>> ETT 4/27>>> OGT 4/27>> Foley 4/27>>>  CULTURES: NA  ANTIBIOTICS: NA   SUBJECTIVE:  Cardiac arrest, pulse after 73min of CPR, multiple doses of epi and shock [facility] protocol Cardiac Cath 4/27 On norepinephrine and dopamine drip  VITAL SIGNS: Temp:  [88.9 F (31.6 C)-97.9 F (36.6 C)] 88.9 F (31.6 C) (04/28 0700) Pulse Rate:  [39-109] 109 (04/28 0817) Resp:  [22-35] 35 (04/28 0817) BP: (122-198)/(70-135) 138/78 mmHg (04/28 0817) SpO2:  [80 %-100 %] 99 % (04/28 0817) Arterial Line BP: (171-221)/(83-117) 221/98 mmHg (04/28 0000) FiO2 (%):  [80 %-100 %] 80 % (04/28 0817) Weight:  [179 lb 0.2 oz (81.2 kg)-184 lb 1.4 oz (83.5 kg)] 184 lb 1.4 oz (83.5 kg) (04/28 0240) HEMODYNAMICS:   VENTILATOR SETTINGS: Vent Mode:  [-] PRVC FiO2 (%):  [80 %-100 %] 80 % Set Rate:  [35 bmp] 35 bmp Vt Set:  [550 mL] 550 mL PEEP:  [5 cmH20] 5 cmH20 Plateau Pressure:  [23 cmH20-30 cmH20] 24 cmH20 INTAKE / OUTPUT: Intake/Output     04/27 0701 - 04/28 0700 04/28 0701 - 04/29 0700   I.V. (mL/kg) 788.8 (9.4)    IV Piggyback 50 50   Total Intake(mL/kg) 838.8 (10) 50 (0.6)   Urine (mL/kg/hr) 257    Emesis/NG output 750    Total  Output 1007     Net -168.2 +50         PHYSICAL EXAMINATION: General:  Sedated and intubated Neuro:  Sedated and paralyzed. HEENT: ETT in place. Cardiovascular:  S1, s2, irregularly irregular rhythm. Lungs:  Bibasilar crackles, mechanical breath sounds. Abdomen:  Soft, no distended, hypoactive BS.  Musculoskeletal:  Good tone. Skin:  Intact.  LABS:  Recent Labs Lab 12/30/12 2312 12/30/12 2336 12/31/12 0156 12/31/12 0230 12/31/12 0231 12/31/12 0500 12/31/12 0700  HGB 14.5 15.0  --  16.7  --   --   --   WBC 8.9  --   --  14.2*  --   --   --   PLT 101*  --   --  207  --   --   --   NA 142 139  --  135  --  135 138  K 3.9 3.8  --  3.9  --  3.0* 2.7*  CL 102 107  --  105  --  104 103  CO2 18*  --   --  15*  --  14* 16*  GLUCOSE 307* 286*  --  240*  --  273* 295*  BUN 16 18  --  22  --  24* 25*  CREATININE 1.78* 1.70*  --  2.02*  --  2.14* 2.16*  CALCIUM 13.4*  --   --  9.9  --  9.7 9.8  MG  --   --   --  3.4*  --   --   --   PHOS  --   --   --  6.2*  --   --   --   AST 135*  --   --  282*  --   --   --   ALT 109*  --   --  154*  --   --   --   ALKPHOS 135*  --   --  195*  --   --   --   BILITOT 0.2*  --   --  0.4  --   --   --   PROT 5.2*  --   --  6.3  --   --   --   ALBUMIN 2.3*  --   --  2.9*  --   --   --   APTT 54*  --   --  163*  --   --   --   INR 2.72*  --   --  9.05*  --   --   --   TROPONINI 0.35*  --   --   --  2.78*  --  6.90*  PHART  --  6.977* 7.296*  --   --   --   --   PCO2ART  --  56.8* 33.9*  --   --   --   --   PO2ART  --  108.0* 102.0*  --   --   --   --     Recent Labs Lab 12/31/12 0225 12/31/12 0305 12/31/12 0405 12/31/12 0506 12/31/12 0609  GLUCAP 191* 187* 210* 251* 237*    CXR: 4/27 Cardiomegaly and mild pulmonary edema CXr: 4/28 No new infiltrates.  ASSESSMENT / PLAN:  PULMONARY A: VDRF - 2/2 cardiac arrest P:   - Full vent support. - Decreased FiO2 to 80%. ABG- pH 7.35, PCO2 25, PO2 112. keep O2 sat > 92% - SBT once  hypothermia is complete and depending on neuro status. - Portable chest x-ray in a.m. - Increase PEEP to 10.  CARDIOVASCULAR A: VF/VT arrest-  STEMI- status post PCI and stenting of CFx, troponins 6.9<2.78<0.35 [facility] protocol P:  - Per cardiology - Aspirin and Plavix. No statin secondary to elevated LFTs and no beta blocker or Ace inhibitors secondary to hypertension and AKI. - Continue [facility] protocol with paralysis - Continue Levophed and dopamine for goal MAP >65   RENAL A:  Acute on chronic kidney disease. Baseline CKD stage 3 - Baseline Cr 1.5, 1.7 on admission Creatinine 2.16<2.02<1.78-likely secondary to PCI. Now hypokalemia- 2.7<3.0<3.9 P:   - K replaced, recheck at 3 PM. - IVF infusion per [facility] protocol - 2L at [place], Mg, Phos  GASTROINTESTINAL A:  No active issues P:   Protonix 40mg  IV daily for SUP  HEMATOLOGIC A:   Leukocytosis. WBC 14.2< 8.9. Likely stress-related. P:  - CBC in AM  INFECTIOUS A:  No active issues P:   - CBC in AM  ENDOCRINE A:  Hyperglycemia - undiagnosed DM2? P:   CBG q 4h SSI  NEUROLOGIC A:  Risk for brain anoxia 2/2 cardiac arrest P:   - Continue [facility] protocol  - Fentanyl and Versed for sedation while on vent - Paralyzed- Nimbex.  PATEL,RAVI, MD 8:43 AM  TODAY'S SUMMARY: Cardiac arrest 2/2 STEMI. CPR for ~30 minutes with return  of pulse. Intubated. [facility] protocol initiated, cath lab- PCI to circumflex- flow not restored. LAD 80% apical lesion.  I have personally obtained a history, examined the patient, evaluated laboratory and imaging results, formulated the assessment and plan and placed orders.  CRITICAL CARE: The patient is critically ill with multiple organ systems failure and requires high complexity decision making for assessment and support, frequent evaluation and titration of therapies, application of advanced monitoring technologies and extensive interpretation of multiple databases.  Critical Care Time devoted to patient care services described in this note is 60 minutes.   Rush Farmer, M.D. Pulmonary and [place] Pager: [phone]  12/31/2012, 8:27 AM
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
## 2012-12-31 NOTE — Progress Notes (Signed)
Called by RN re: increasing ventricular ectopy and short runs of NSVT. K+ still low and being supplemented.   Spoke with SK, pt is critically ill and amiodarone not a good option because of abnormal LFTs. Will give bolus of Lidocaine and start a low-dose drip. Hopefully, potassium level will improve and will be able to d/c the [place] in am.

## 2012-12-31 NOTE — Progress Notes (Signed)
  Echocardiogram 2D Echocardiogram has been performed.  [place], [place] 12/31/2012, 11:12 AM
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
## 2012-12-31 NOTE — Progress Notes (Signed)
Pt INR 3.75 at 2100. Arterial sheath due to be pulled at 2200. Notified Dr. Percival Spanish of lab value and asked how to proceed. New orders received - 1mg  Vit K IV and sheath pull in the morning. Will continue to monitor closely.
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
## 2012-12-31 NOTE — Progress Notes (Signed)
Notified cardiologist regarding troponin & INR. Will plan to administer vit K. Recheck INR at 2000 & precede to pull sheath.

## 2012-12-31 NOTE — Progress Notes (Signed)
[facility] Progress Note Patient Name: Logan White DOB: May 08, 1941 MRN: [id number]  Date of Service  12/31/2012   HPI/Events of Note     eICU Interventions  Hypokalemia -repleted    Intervention Category Intermediate Interventions: Electrolyte abnormality - evaluation and management  Ieisha Gao V. 12/31/2012, 6:10 PM

## 2012-12-31 NOTE — Progress Notes (Signed)
Paged Dr. Jimmy Footman for pt HR dropping into the 20s and in ventricular bigeminy. Dr. Jimmy Footman told me to page on call cardiologist. I paged Dr. Radford Pax with no return page. I called back to Dr. Jimmy Footman and received new orders. Will continue to monitor closely.
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
## 2012-12-31 NOTE — ED Notes (Signed)
To Cath lab 

## 2012-12-31 NOTE — Progress Notes (Signed)
Chaplain responded to ED at the request of nurse. Pt was brought in as CPR in-progress and later changed to code stemi. Chaplain escorted family to consult room A and had doctor briefed them on pt's condition. Chaplain provided ministry of presence, empathic listening to family and shared words of hope and encouragement until pt was admitted to 2907. Chaplain will follow-up as needed. Family thanked chaplain for his presence and support. Jarrett Ables [id number]  12/31/12 0250  Clinical Encounter Type  Visited With Patient and family together  Visit Type Initial;Spiritual support;Critical Care;ED;Trauma  Referral From Nurse  Spiritual Encounters  Spiritual Needs Emotional  Stress Factors  Patient Stress Factors Lack of knowledge;Not reviewed  Family Stress Factors Major life changes (Shock)

## 2012-12-31 NOTE — Progress Notes (Addendum)
Patient Name: Logan White Date of Encounter: 12/31/2012  Principal Problem:   Cardiac arrest due to underlying cardiac condition Active Problems:   Acute MI, anterolateral wall, initial episode of care   VF (ventricular fibrillation)   Acute on chronic renal insufficiency   HYPERLIPIDEMIA-MIXED   HYPERTENSION   PERIPHERAL VASCULAR DISEASE   Abnormal LFTs   Chronic anticoagulation    SUBJECTIVE: Intubated and sedated  OBJECTIVE Filed Vitals:   12/31/12 0400 12/31/12 0500 12/31/12 0550 12/31/12 0600  BP:    148/82  Pulse:    86  Temp: 95.4 F (35.2 C) 93.7 F (34.3 C) 91.4 F (33 C) 91 F (32.8 C)  TempSrc: Core (Comment) Core (Comment) Core (Comment) Core (Comment)  Resp: 35 35  35  Height:      Weight:      SpO2:  80%      Intake/Output Summary (Last 24 hours) at 12/31/12 0814 Last data filed at 12/31/12 0751  Gross per 24 hour  Intake 888.78 ml  Output   1007 ml  Net -118.22 ml   Filed Weights   12/30/12 2355 12/31/12 0240  Weight: 179 lb 0.2 oz (81.2 kg) 184 lb 1.4 oz (83.5 kg)    PHYSICAL EXAM General: Well developed, well nourished, male sedated on the vent Head: Normocephalic, atraumatic.  Neck:  without bruits, JVD - U/A to accurately assess due to equipment. Lungs:  Resp regular and unlabored,Clear anteriorly, bases not assessed due to [employer] [facility]. Heart: Slightly irregular, S1, S2, no S3, S4, or murmur; no rub. Abdomen: Soft, non-tender, non-distended, BS not heard.  Extremities: No clubbing, cyanosis, no edema. No LE pulses felt  Neuro: Sedated.  LABS: CBC:  Recent Labs  12/30/12 2312 12/30/12 2336 12/31/12 0230  WBC 8.9  --  14.2*  NEUTROABS 2.4  --   --   HGB 14.5 15.0 16.7  HCT 39.9 44.0 43.4  MCV 77.5*  --  74.1*  PLT 101*  --  207   INR:  Recent Labs  12/31/12 0230  INR [id number]*   Basic Metabolic Panel:  Recent Labs  12/31/12 0230 12/31/12 0500 12/31/12 0700  NA 135 135 138  K 3.9 3.0* 2.7*  CL 105 104  103  CO2 15* 14* 16*  GLUCOSE 240* 273* 295*  BUN 22 24* 25*  CREATININE 2.02* 2.14* 2.16*  CALCIUM 9.9 9.7 9.8  MG 3.4*  --   --   PHOS 6.2*  --   --    Liver Function Tests:  Recent Labs  12/30/12 2312 12/31/12 0230  AST 135* 282*  ALT 109* 154*  ALKPHOS 135* 195*  BILITOT 0.2* 0.4  PROT 5.2* 6.3  ALBUMIN 2.3* 2.9*   Cardiac Enzymes:  Recent Labs  12/30/12 2312 12/31/12 0231 12/31/12 0700  TROPONINI 0.35* 2.78* 6.90*    Recent Labs  12/30/12 2334  TROPIPOC 0.09*   TELE:  SR with frequent PVCs, some multifocal, periods of bigeminy    ECG: -APR-2014 02:22:49  Normal sinus rhythm Left ventricular hypertrophy with repolarization abnormality Vent. rate 61 BPM PR interval 206 ms QRS duration 114 ms QT/QTc 466/469 ms P-R-T axes 51 -8 149  Radiology/Studies: Dg Chest Portable 1 View 12/31/2012  *RADIOLOGY REPORT*  Clinical Data: Cardiac arrest.  PORTABLE CHEST - 1 VIEW  Comparison: 11/25/2011.  Findings: Endotracheal tube tip is 65 mm from the carina.  Enteric tube is present with the tip not visualized.  Defibrillator pads overlie the chest. Cardiopericardial silhouette is  enlarged. Diffuse airspace disease is present compatible with cardiogenic pulmonary edema.  Both interstitial and alveolar edema present. There is no pneumothorax identified.  IMPRESSION:  1.  Support apparatus as described above. 2.  Cardiomegaly and moderate pulmonary edema.   Original Report Authenticated By: Dereck Ligas, M.D.     Current Medications:  . antiseptic oral rinse  15 mL Mouth Rinse QID  . artificial tears  1 application Both Eyes [id number]  . [START ON 01/01/2013] aspirin  81 mg Per NG tube Daily  . chlorhexidine  15 mL Mouth Rinse BID  . chlorhexidine  15 mL Mouth/Throat Once  . Chlorhexidine Gluconate Cloth  6 each Topical Q0600  . [START ON 01/01/2013] clopidogrel  75 mg Per NG tube Q breakfast  . fentaNYL  100 mcg Intravenous Once  . insulin aspart  0-9 Units Subcutaneous Q4H   . midazolam  2 mg Intravenous Once  . mupirocin ointment  1 application Nasal BID  . norepinephrine (LEVOPHED) Adult infusion  2-50 mcg/min Intravenous To Cath  . pantoprazole (PROTONIX) IV  40 mg Intravenous Q24H  . potassium chloride  10 mEq Intravenous Q1 Hr x 3  . procainamide (PRONESTYL) BOLUS IVPB  1,000 mg Intravenous Once   . sodium chloride 10 mL/hr at 12/31/12 0336  . sodium chloride Stopped (12/31/12 [id number])  . cisatracurium (NIMBEX) infusion 1 mcg/kg/min (12/31/12 0248)  . DOPamine Stopped (12/31/12 0749)  . fentaNYL infusion INTRAVENOUS 250 mcg/hr (12/31/12 0430)  . insulin (NOVOLIN-R) infusion 2.3 Units/hr (12/31/12 0809)  . midazolam (VERSED) infusion 4 mg/hr (12/31/12 0512)  .  sodium bicarbonate  infusion 1000 mL 150 mL/hr at 12/31/12 0321    ASSESSMENT AND PLAN: 72 yo male with hx CAD was seen slumped over by son, who started CPR. VT/VF on EMS arrival, prolonged CPR at scene and in ER. Documented in ER at 11 pm, taken to the cath lab at 2 am. Su Hilt protocol initiated.  Principal Problem:   Cardiac arrest due to underlying cardiac condition/  VF (ventricular fibrillation) - Pt with frequent ventricular ectopy now. MD advise on amiodarone. As BP improves, may be able to add low-dose BB. Mgt of vent/hypothermia, etc, per CCM  Active Problems:   Acute MI, anterolateral wall, initial episode of care -  In lab, CFX got PCI but U/A to restore distal flow (?chronic occlusion). LAD 80% apical lesion. Plavix loaded. Consider PCI LAD or CTO PCI of the CFX later if pt condition will tolerate. For now, continue ASA/Plavix. No statin secondary to elevated LFTs, no BB or ACE secondary to hypotension and acute renal insufficiency.    Chronic anticoagulation -  INR 2.7 on admission, coumadin held, follow.    Hypokalemia - 3 runs ordered but K+ lower after 1 run, add 3 more. Otherwise, per CCM.  Otherwise, per CCM.   HYPERLIPIDEMIA-MIXED   HYPERTENSION   PERIPHERAL VASCULAR  DISEASE   Abnormal LFTs   Acute on chronic renal insufficiency   Signed, Rosaria Ferries , PA-C 8:14 AM 12/31/2012  Pt had OOH CArrest in 6/13 attributed to profound hypokalemia;  There had been loss of LV function which recovered and further testing was deferred 2/2 to neg myoview 5/13.  Cath last pm showed occluded Cx>> with Tn now 6.9.  Interestingly at last arrest Tn was normal.  CPR was more prolonged this time Continue supportive care Will wait and see what happens to LVEF Since written TN>19 consistent w MI as primary event SHeath still in-- on coumadin for  afib but also with hx of stroke.  The sheath needs to come out  Will give vit K and pull sheath  Heparinize if necessary but aware of risk benefit  Bleeding asso with sheath is potentially hazardous  Lido started earlier for mi assoc ventricular ectopy w subsequent impairment of hemodynmaics  Will plan to d/c in am

## 2012-12-31 NOTE — Progress Notes (Signed)
INITIAL NUTRITION ASSESSMENT  DOCUMENTATION CODES Per approved criteria  -Not Applicable   INTERVENTION: 1. If EN warranted, recommend Initiate Glucerna 1.2 @ 20 ml/hr via OG and increase by 10 ml every 4 hours to goal rate of 65 ml/hr. 30 ml Prostat BID.  At goal rate, tube feeding regimen will provide 2072 kcal, 124 grams of protein, and 1256 ml of H2O.    NUTRITION DIAGNOSIS: Inadequate oral intake related to inability to eat as evidenced by NPO diet status.   Goal: Intake to meet >/=90% estimated nutrition needs  Monitor:  Vent status, weight trends, initiation of EN, labs   Reason for Assessment: VRDF  72 y.o. male  Admitting Dx: Cardiac arrest due to underlying cardiac condition  ASSESSMENT: Pt admitted with cardiac arrest, taken to cath lab and started on arctic sun protocol. Pt with witnessed arrest, required CPR ~30 minutes and several shocks for ROC.   Recommend if pt to remain intubated post hypothermia, recommend initiation of enteral nutrition.    Height: Ht Readings from Last 1 Encounters:  12/31/12 6\' 2"  (1.88 m)    Weight: Wt Readings from Last 1 Encounters:  12/31/12 184 lb 1.4 oz (83.5 kg)    Ideal Body Weight: 190 lbs   % Ideal Body Weight: 96%  Wt Readings from Last 10 Encounters:  12/31/12 184 lb 1.4 oz (83.5 kg)  12/31/12 184 lb 1.4 oz (83.5 kg)  08/23/12 179 lb (81.194 kg)  05/28/12 169 lb (76.658 kg)  04/23/12 171 lb 12.8 oz (77.928 kg)  03/26/12 163 lb 6.4 oz (74.118 kg)  03/15/12 166 lb 6.4 oz (75.479 kg)  03/15/12 166 lb 6.4 oz (75.479 kg)  03/15/12 166 lb 6.4 oz (75.479 kg)  01/09/12 170 lb (77.111 kg)    Usual Body Weight: ~170 lbs, trending up   % Usual Body Weight: 108%  BMI:  Body mass index is 23.62 kg/(m^2). WNL   Patient is currently intubated on ventilator support.  MV: 17.2 Temp:Temp (24hrs), Avg:94.3 F (34.6 C), Min:88.9 F (31.6 C), Max:97.9 F (36.6 C)  Propofol: none  Estimated Nutritional Needs: Kcal:  2114 Protein: 115-125 gm  Fluid: >/= 2 L   Skin: intact   Diet Order:   NPO  EDUCATION NEEDS: -No education needs identified at this time   Intake/Output Summary (Last 24 hours) at 12/31/12 1123 Last data filed at 12/31/12 1001  Gross per 24 hour  Intake 1608.38 ml  Output   1127 ml  Net 481.38 ml    Last BM: 4/28   Labs:   Recent Labs Lab 12/31/12 0230 12/31/12 0500 12/31/12 0700 12/31/12 1008  NA 135 135 138 140  K 3.9 3.0* 2.7* 2.9*  CL 105 104 103 111  CO2 15* 14* 16*  --   BUN 22 24* 25* 27*  CREATININE 2.02* 2.14* 2.16* 2.30*  CALCIUM 9.9 9.7 9.8  --   MG 3.4*  --   --   --   PHOS 6.2*  --   --   --   GLUCOSE 240* 273* 295* 265*    CBG (last 3)   Recent Labs  12/31/12 0405 12/31/12 0506 12/31/12 0609  GLUCAP 210* 251* 237*    Scheduled Meds: . antiseptic oral rinse  15 mL Mouth Rinse QID  . artificial tears  1 application Both Eyes [id number]  . [START ON 01/01/2013] aspirin  81 mg Per NG tube Daily  . chlorhexidine  15 mL Mouth Rinse BID  . Chlorhexidine Gluconate Cloth  6 each Topical Q0600  . [START ON 01/01/2013] clopidogrel  75 mg Per NG tube Q breakfast  . fentaNYL  100 mcg Intravenous Once  . midazolam  2 mg Intravenous Once  . mupirocin ointment  1 application Nasal BID  . pantoprazole (PROTONIX) IV  40 mg Intravenous Q24H  . potassium chloride  10 mEq Intravenous Q1 Hr x 3  . procainamide (PRONESTYL) BOLUS IVPB  1,000 mg Intravenous Once    Continuous Infusions: . sodium chloride 10 mL/hr at 12/31/12 0336  . sodium chloride Stopped (12/31/12 [id number])  . cisatracurium (NIMBEX) infusion 1 mcg/kg/min (12/31/12 0800)  . DOPamine 2 mcg/kg/min (12/31/12 0925)  . fentaNYL infusion INTRAVENOUS 250 mcg/hr (12/31/12 0800)  . insulin (NOVOLIN-R) infusion 5.6 Units/hr (12/31/12 1000)  . midazolam (VERSED) infusion 4 mg/hr (12/31/12 0800)  . norepinephrine (LEVOPHED) Adult infusion Stopped (12/31/12 0845)  .  sodium bicarbonate  infusion 1000 mL 150  mL/hr at 12/31/12 0321    Past Medical History  Diagnosis Date  . Inguinal hernia without mention of obstruction or gangrene, unilateral or unspecified, (not specified as recurrent)   . 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 Peripheral vascular disease, unspecified   . Carotid stenosis     dopplers 1/11: 60-79% bilat.  . Hyperlipidemia     mixed  . Hypertension     a. echo 4/12: EF 50%, asymmetric septal hypertrophy, no SAM or LVOT gradient, LAE, PASP 35  . Coronary artery disease     a. s/p aborted ant STEMI tx with Cypher DES to LAD 10/04 (residual at cath: D1 50%, CFX 40% and multiple dist 70%, EF 55%);   b. myoview 3/10: Ef 47%, infero-apical isch, LOW RISK - med Tx recommended  . CKD (chronic kidney disease)   . Paroxysmal atrial fibrillation 12/2010  . Diverticulosis     2001  . Diabetes mellitus   . H/O: CVA (cardiovascular accident)   . Coronary artery disease     a. s/p aborted ant STEMI s/p DES to LAD 06/2003.  Marland Kitchen Hypertension   . Glucose intolerance (impaired glucose tolerance)   . Pneumonia   . Carotid artery stenosis     01/2011 - 40-59% bilateral stenosis  . Peripheral vascular disease     h/o LE angioplasty  . 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 Cardiomyopathy     a. EF 50-55% by echo 03/09/12 (was 30% by echo 02/24/12)  . Chronic renal insufficiency   . Paroxysmal atrial fibrillation   . Mitral regurgitation     Mild by echo 03/2012  . Hematemesis     12/2010 felt 2/2 Mallory Weiss tear - pt could not afford colonscopy/EGD at that time so Coumadin was deferred. Coumadin initiated 03/2012 without any evidence for bleeding.  Marland Kitchen Hyperlipidemia   . Inguinal hernia   . Diverticulosis 2001  . QT prolongation   . Bradycardia     Requiring discontinuation of use of  BB/CCB  . Elevated LFTs     Shock liver 03/2012    Past Surgical History  Procedure Laterality Date  . Popliteal artery angioplasty  01/07/07    s/p LEFT POPLITEAL ARTERY EXPLORATION AND VEIN PATCH ANGIOPLASTY, PER DR. EARLY, SECONDARY TO ISCHEMIC LEFT FOOT RELATED TO LEFT POPLITEAL ARTERY INJURY  . Orif tibia & fibula fractures  01/11/07    OPEN TX OF  UNICONDYLAR PLATEAU FRACTURE, IRRIGATION/DEBRIDEMENT OF OPEN FRACTURE INCLUDING BONE, REMOVAL OF EXTERNAL FIXATOR UNDER ANESTHESIA PER DR. MICHAEL HANDY  . Hernia repair    . Skin graft      Orson Slick RD, LDN Pager [phone] After Hours pager [phone]

## 2012-12-31 NOTE — Interval H&P Note (Signed)
History and Physical Interval Note:  12/31/2012 2:02 AM  Logan White  has presented today for Emergent Cardiac Catheter, with the diagnosis of VF/VT Arrest - possible STEMI.  The various methods of treatment have been discussed with the patient's family. After consideration of risks, benefits and other options for treatment, the family has verbally consented to  Procedure(s): LEFT HEART CATH (N/A) as a surgical intervention .     The patient's history has been reviewed, patient examined, no change in status, stable for surgery.  I have reviewed the patient's chart and labs.  Questions were answered to the patient's wife's satisfaction.     HARDING,DAVID W

## 2012-12-31 NOTE — Progress Notes (Signed)
CRITICAL VALUE ALERT  Critical value received:  INR 9.05  Date of notification:  12/31/12  Time of notification:  0430  Critical value read back:yes  Nurse who received alert:  Fernand Parkins, RN  MD notified (1st page):  Dellia Nims RN  Time of first page:  [phone]  Responding MD:  Dellia Nims RN  Time MD responded:  [phone]

## 2012-12-31 NOTE — Progress Notes (Signed)
MEDICATION RELATED CONSULT NOTE   Patient is on chronic warfarin prior to admission with therapeutic INR of 2.7 prior to cath. No pharmacological dvt prophylaxis is indicated at this time.  Erin Hearing PharmD., BCPS Clinical Pharmacist Pager [phone] 12/31/2012 8:36 AM

## 2013-01-01 DIAGNOSIS — N179 Acute kidney failure, unspecified: Secondary | ICD-10-CM

## 2013-01-01 LAB — HEPATIC FUNCTION PANEL
Alkaline Phosphatase: 149 U/L — ABNORMAL HIGH (ref 39–117)
Indirect Bilirubin: 0.6 mg/dL (ref 0.3–0.9)
Total Bilirubin: 0.8 mg/dL (ref 0.3–1.2)
Total Protein: 5.3 g/dL — ABNORMAL LOW (ref 6.0–8.3)

## 2013-01-01 LAB — BASIC METABOLIC PANEL WITH GFR
BUN: 35 mg/dL — ABNORMAL HIGH (ref 6–23)
CO2: 23 meq/L (ref 19–32)
Calcium: 8.4 mg/dL (ref 8.4–10.5)
Chloride: 100 meq/L (ref 96–112)
Creatinine, Ser: 2.16 mg/dL — ABNORMAL HIGH (ref 0.50–1.35)
GFR calc Af Amer: 34 mL/min — ABNORMAL LOW
GFR calc non Af Amer: 29 mL/min — ABNORMAL LOW
Glucose, Bld: 155 mg/dL — ABNORMAL HIGH (ref 70–99)
Potassium: 3.6 meq/L (ref 3.5–5.1)
Sodium: 136 meq/L (ref 135–145)

## 2013-01-01 LAB — BASIC METABOLIC PANEL
BUN: 33 mg/dL — ABNORMAL HIGH (ref 6–23)
BUN: 35 mg/dL — ABNORMAL HIGH (ref 6–23)
BUN: 36 mg/dL — ABNORMAL HIGH (ref 6–23)
Calcium: 8.6 mg/dL (ref 8.4–10.5)
Calcium: 8.1 mg/dL — ABNORMAL LOW (ref 8.4–10.5)
Calcium: 7.7 mg/dL — ABNORMAL LOW (ref 8.4–10.5)
Creatinine, Ser: 2.2 mg/dL — ABNORMAL HIGH (ref 0.50–1.35)
Creatinine, Ser: 2.16 mg/dL — ABNORMAL HIGH (ref 0.50–1.35)
Creatinine, Ser: 2.22 mg/dL — ABNORMAL HIGH (ref 0.50–1.35)
Creatinine, Ser: 2.37 mg/dL — ABNORMAL HIGH (ref 0.50–1.35)
GFR calc Af Amer: 33 mL/min — ABNORMAL LOW (ref >90)
GFR calc Af Amer: 34 mL/min — ABNORMAL LOW (ref >90)
GFR calc Af Amer: 33 mL/min — ABNORMAL LOW (ref >90)
GFR calc Af Amer: 31 mL/min — ABNORMAL LOW (ref >90)
GFR calc non Af Amer: 29 mL/min — ABNORMAL LOW (ref >90)
GFR calc non Af Amer: 29 mL/min — ABNORMAL LOW (ref >90)
GFR calc non Af Amer: 29 mL/min — ABNORMAL LOW (ref >90)
GFR calc non Af Amer: 28 mL/min — ABNORMAL LOW (ref >90)
Glucose, Bld: 155 mg/dL — ABNORMAL HIGH (ref 70–99)
Potassium: 3.6 mEq/L (ref 3.5–5.1)
Sodium: 139 mEq/L (ref 135–145)

## 2013-01-01 LAB — BLOOD GAS, ARTERIAL
Acid-base deficit: 0 mmol/L (ref 0.0–2.0)
Bicarbonate: 22.2 meq/L (ref 20.0–24.0)
Drawn by: 312761
FIO2: 0.8 %
MECHVT: 0.55 mL
O2 Saturation: 99.3 %
PEEP: 10 cmH2O
Patient temperature: 98.6
RATE: 35 {breaths}/min
TCO2: 23 mmol/L (ref 0–100)
pCO2 arterial: 25.2 mmHg — ABNORMAL LOW (ref 35.0–45.0)
pH, Arterial: 7.553 — ABNORMAL HIGH (ref 7.350–7.450)
pO2, Arterial: 237 mmHg — ABNORMAL HIGH (ref 80.0–100.0)

## 2013-01-01 LAB — PROTIME-INR
INR: 2.86 — ABNORMAL HIGH (ref 0.00–1.49)
Prothrombin Time: 28.5 seconds — ABNORMAL HIGH (ref 11.6–15.2)

## 2013-01-01 LAB — CBC
HCT: 44.2 % (ref 39.0–52.0)
Hemoglobin: 17.6 g/dL — ABNORMAL HIGH (ref 13.0–17.0)
MCH: 28.7 pg (ref 26.0–34.0)
MCHC: 39.8 g/dL — ABNORMAL HIGH (ref 30.0–36.0)
MCV: 72 fL — ABNORMAL LOW (ref 78.0–100.0)
Platelets: 165 K/uL (ref 150–400)
RBC: 6.14 MIL/uL — ABNORMAL HIGH (ref 4.22–5.81)
RDW: 13.7 % (ref 11.5–15.5)
WBC: 6.3 K/uL (ref 4.0–10.5)

## 2013-01-01 LAB — LACTIC ACID, PLASMA
Lactic Acid, Venous: 3.2 mmol/L — ABNORMAL HIGH (ref 0.5–2.2)
Lactic Acid, Venous: 3 mmol/L — ABNORMAL HIGH (ref 0.5–2.2)

## 2013-01-01 LAB — GLUCOSE, CAPILLARY
Glucose-Capillary: 122 mg/dL — ABNORMAL HIGH (ref 70–99)
Glucose-Capillary: 122 mg/dL — ABNORMAL HIGH (ref 70–99)
Glucose-Capillary: 100 mg/dL — ABNORMAL HIGH (ref 70–99)
Glucose-Capillary: 49 mg/dL — ABNORMAL LOW (ref 70–99)
Glucose-Capillary: 57 mg/dL — ABNORMAL LOW (ref 70–99)
Glucose-Capillary: 95 mg/dL (ref 70–99)

## 2013-01-01 LAB — POCT I-STAT 3, ART BLOOD GAS (G3+)
Bicarbonate: 25.5 mEq/L — ABNORMAL HIGH (ref 20.0–24.0)
Patient temperature: 32.8
TCO2: 26 mmol/L (ref 0–100)
pCO2 arterial: 25.7 mmHg — ABNORMAL LOW (ref 35.0–45.0)
pH, Arterial: 7.589 — ABNORMAL HIGH (ref 7.350–7.450)

## 2013-01-01 LAB — MAGNESIUM: Magnesium: 2 mg/dL (ref 1.5–2.5)

## 2013-01-01 MED ORDER — PANTOPRAZOLE SODIUM 40 MG IV SOLR
40.0000 mg | INTRAVENOUS | Status: DC
  Administered 2013-01-01 – 2013-01-03 (×3): 40 mg via INTRAVENOUS
  Filled 2013-01-01 (×4): qty 40

## 2013-01-01 MED ORDER — DEXTROSE 50 % IV SOLN: 50.0000 mL | Freq: Once | INTRAVENOUS | Status: AC | PRN

## 2013-01-01 MED ORDER — DEXTROSE 50 % IV SOLN
INTRAVENOUS | Status: AC
  Administered 2013-01-01: 50 mL
  Filled 2013-01-01: qty 50

## 2013-01-01 MED ORDER — SODIUM GLYCEROPHOSPHATE 1 MMOLE/ML IV SOLN
20.0000 mmol | Freq: Once | INTRAVENOUS | Status: AC
  Administered 2013-01-01: 20 mmol via INTRAVENOUS
  Filled 2013-01-01: qty 20

## 2013-01-01 MED ORDER — SODIUM GLYCEROPHOSPHATE 1 MMOLE/ML IV SOLN
10.0000 mmol | Freq: Once | INTRAVENOUS | Status: AC
  Administered 2013-01-01: 10 mmol via INTRAVENOUS
  Filled 2013-01-01: qty 10

## 2013-01-01 MED ORDER — SODIUM PHOSPHATE 3 MMOLE/ML IV SOLN: 30.0000 mmol | Freq: Once | INTRAVENOUS | Status: DC

## 2013-01-01 MED ORDER — DEXTROSE 50 % IV SOLN: 25.0000 mL | Freq: Once | INTRAVENOUS | Status: AC | PRN

## 2013-01-01 MED ORDER — VITAMIN K1 10 MG/ML IJ SOLN
1.0000 mg | Freq: Once | INTRAVENOUS | Status: AC
  Administered 2013-01-01: 1 mg via INTRAVENOUS
  Filled 2013-01-01: qty 0.1

## 2013-01-01 MED ORDER — POTASSIUM CHLORIDE 10 MEQ/50ML IV SOLN
INTRAVENOUS | Status: AC
  Filled 2013-01-01: qty 50

## 2013-01-01 MED ORDER — POTASSIUM CHLORIDE 10 MEQ/50ML IV SOLN
10.0000 meq | INTRAVENOUS | Status: DC
  Administered 2013-01-01 (×4): 10 meq via INTRAVENOUS
  Filled 2013-01-01: qty 100
  Filled 2013-01-01: qty 50

## 2013-01-01 MED FILL — Sodium Chloride IV Soln 0.9%: INTRAVENOUS | Qty: 50 | Status: AC

## 2013-01-01 NOTE — Progress Notes (Signed)
[facility] Progress Note Patient Name: BERISH RIVENBURG DOB: 06/07/1941 MRN: [id number]  Date of Service  01/01/2013   HPI/Events of Note  Hypokalemia   eICU Interventions  Potassium replaced   Intervention Category Intermediate Interventions: Electrolyte abnormality - evaluation and management  DETERDING,ELIZABETH 01/01/2013, 1:16 AM

## 2013-01-01 NOTE — Progress Notes (Signed)
Pt. Acutely hypotensive; cuff/A-line correlating.  Norepinephrine titrated up to 25 mcg/min, Dopamine increased to 3 mcg/kg/min.  CCM notified via e-link.

## 2013-01-01 NOTE — Progress Notes (Signed)
[facility] Progress Note Patient Name: Logan White DOB: Nov 12, 1940 MRN: [id number]  Date of Service  01/01/2013   HPI/Events of Note  hypophosphatemia   eICU Interventions  Phos replaced   Intervention Category Intermediate Interventions: Electrolyte abnormality - evaluation and management  Yee Joss 01/01/2013, 6:13 AM

## 2013-01-01 NOTE — Plan of Care (Signed)
Problem: Phase II Progression Outcomes Goal: Hemodynamically stable Outcome: Progressing Titrating levophed to maintain MAP > 85.  Dopamine infusing to maintain HR > 60.

## 2013-01-01 NOTE — Progress Notes (Signed)
SUBJECTIVE:  Intubated sedated and cooled.     PHYSICAL EXAM Filed Vitals:   01/01/13 0700 01/01/13 0800 01/01/13 0802 01/01/13 0840  BP:   134/75   Pulse:   74   Temp: 91.4 F (33 C) 92.7 F (33.7 C)    TempSrc: Core (Comment)     Resp:  24 24   Height:      Weight:      SpO2:  100%  100%   General:  Acutely ill Lungs:  Clear Heart:  Irregular, distant heart sounds Abdomen:  Distended Extremities:  Diffuse mild edema  LABS: Lab Results  Component Value Date   CKTOTAL 602* 02/23/2012   CKMB 20.9* 02/23/2012   TROPONINI 19.30* 12/31/2012   Results for orders placed during the hospital encounter of 12/30/12 (from the past 24 hour(s))  GLUCOSE, CAPILLARY     Status: Abnormal   Collection Time    12/31/12  9:10 AM      Result Value Range   Glucose-Capillary 280 (*) 70 - 99 mg/dL  GLUCOSE, CAPILLARY     Status: Abnormal   Collection Time    12/31/12 10:06 AM      Result Value Range   Glucose-Capillary 247 (*) 70 - 99 mg/dL  POCT I-STAT, CHEM 8     Status: Abnormal   Collection Time    12/31/12 10:08 AM      Result Value Range   Sodium 140  135 - 145 mEq/L   Potassium 2.9 (*) 3.5 - 5.1 mEq/L   Chloride 111  96 - 112 mEq/L   BUN 27 (*) 6 - 23 mg/dL   Creatinine, Ser 2.30 (*) 0.50 - 1.35 mg/dL   Glucose, Bld 265 (*) 70 - 99 mg/dL   Calcium, Ion 1.34 (*) 1.13 - 1.30 mmol/L   TCO2 16  0 - 100 mmol/L   Hemoglobin 18.4 (*) 13.0 - 17.0 g/dL   HCT 54.0 (*) 39.0 - 52.0 %  GLUCOSE, CAPILLARY     Status: Abnormal   Collection Time    12/31/12 11:08 AM      Result Value Range   Glucose-Capillary 228 (*) 70 - 99 mg/dL  APTT     Status: Abnormal   Collection Time    12/31/12 12:10 PM      Result Value Range   aPTT 75 (*) 24 - 37 seconds  PROTIME-INR     Status: Abnormal   Collection Time    12/31/12 12:10 PM      Result Value Range   Prothrombin Time 34.1 (*) 11.6 - 15.2 seconds   INR 3.64 (*) 0.00 - 99991111  BASIC METABOLIC PANEL     Status: Abnormal   Collection  Time    12/31/12 12:10 PM      Result Value Range   Sodium 136  135 - 145 mEq/L   Potassium 2.8 (*) 3.5 - 5.1 mEq/L   Chloride 105  96 - 112 mEq/L   CO2 16 (*) 19 - 32 mEq/L   Glucose, Bld 217 (*) 70 - 99 mg/dL   BUN 28 (*) 6 - 23 mg/dL   Creatinine, Ser 2.17 (*) 0.50 - 1.35 mg/dL   Calcium 9.5  8.4 - 10.5 mg/dL   GFR calc non Af Amer 29 (*) >90 mL/min   GFR calc Af Amer 34 (*) >90 mL/min  GLUCOSE, CAPILLARY     Status: Abnormal   Collection Time    12/31/12 12:12 PM  Result Value Range   Glucose-Capillary 199 (*) 70 - 99 mg/dL  GLUCOSE, CAPILLARY     Status: Abnormal   Collection Time    12/31/12  1:10 PM      Result Value Range   Glucose-Capillary 190 (*) 70 - 99 mg/dL  GLUCOSE, CAPILLARY     Status: Abnormal   Collection Time    12/31/12  2:08 PM      Result Value Range   Glucose-Capillary 183 (*) 70 - 99 mg/dL  TROPONIN I     Status: Abnormal   Collection Time    12/31/12  2:31 PM      Result Value Range   Troponin I 19.30 (*) <0.30 ng/mL  LACTIC ACID, PLASMA     Status: Abnormal   Collection Time    12/31/12  2:59 PM      Result Value Range   Lactic Acid, Venous 3.6 (*) 0.5 - 2.2 mmol/L  GLUCOSE, CAPILLARY     Status: Abnormal   Collection Time    12/31/12  3:00 PM      Result Value Range   Glucose-Capillary 175 (*) 70 - 99 mg/dL  GLUCOSE, CAPILLARY     Status: Abnormal   Collection Time    12/31/12  4:03 PM      Result Value Range   Glucose-Capillary 159 (*) 70 - 99 mg/dL  PROTIME-INR     Status: Abnormal   Collection Time    12/31/12  5:00 PM      Result Value Range   Prothrombin Time 34.0 (*) 11.6 - 15.2 seconds   INR 3.62 (*) 0.00 - 99991111  BASIC METABOLIC PANEL     Status: Abnormal   Collection Time    12/31/12  5:00 PM      Result Value Range   Sodium 138  135 - 145 mEq/L   Potassium 2.9 (*) 3.5 - 5.1 mEq/L   Chloride 106  96 - 112 mEq/L   CO2 19  19 - 32 mEq/L   Glucose, Bld 135 (*) 70 - 99 mg/dL   BUN 32 (*) 6 - 23 mg/dL   Creatinine, Ser  2.25 (*) 0.50 - 1.35 mg/dL   Calcium 9.2  8.4 - 10.5 mg/dL   GFR calc non Af Amer 28 (*) >90 mL/min   GFR calc Af Amer 32 (*) >90 mL/min  GLUCOSE, CAPILLARY     Status: Abnormal   Collection Time    12/31/12  5:02 PM      Result Value Range   Glucose-Capillary 138 (*) 70 - 99 mg/dL  GLUCOSE, CAPILLARY     Status: Abnormal   Collection Time    12/31/12  6:03 PM      Result Value Range   Glucose-Capillary 128 (*) 70 - 99 mg/dL  GLUCOSE, CAPILLARY     Status: Abnormal   Collection Time    12/31/12  7:25 PM      Result Value Range   Glucose-Capillary 128 (*) 70 - 99 mg/dL  GLUCOSE, CAPILLARY     Status: Abnormal   Collection Time    12/31/12  8:21 PM      Result Value Range   Glucose-Capillary 122 (*) 70 - 99 mg/dL  LACTIC ACID, PLASMA     Status: Abnormal   Collection Time    12/31/12  8:59 PM      Result Value Range   Lactic Acid, Venous 3.4 (*) 0.5 - 2.2 mmol/L  BASIC METABOLIC PANEL  Status: Abnormal   Collection Time    12/31/12  9:15 PM      Result Value Range   Sodium 139  135 - 145 mEq/L   Potassium 3.4 (*) 3.5 - 5.1 mEq/L   Chloride 105  96 - 112 mEq/L   CO2 20  19 - 32 mEq/L   Glucose, Bld 139 (*) 70 - 99 mg/dL   BUN 32 (*) 6 - 23 mg/dL   Creatinine, Ser 2.18 (*) 0.50 - 1.35 mg/dL   Calcium 8.8  8.4 - 10.5 mg/dL   GFR calc non Af Amer 29 (*) >90 mL/min   GFR calc Af Amer 34 (*) >90 mL/min  PROTIME-INR     Status: Abnormal   Collection Time    12/31/12  9:15 PM      Result Value Range   Prothrombin Time 34.9 (*) 11.6 - 15.2 seconds   INR 3.75 (*) 0.00 - 1.49  GLUCOSE, CAPILLARY     Status: Abnormal   Collection Time    12/31/12  9:15 PM      Result Value Range   Glucose-Capillary 138 (*) 70 - 99 mg/dL  GLUCOSE, CAPILLARY     Status: Abnormal   Collection Time    12/31/12 11:48 PM      Result Value Range   Glucose-Capillary 154 (*) 70 - 99 mg/dL  BASIC METABOLIC PANEL     Status: Abnormal   Collection Time    01/01/13 12:00 AM      Result Value  Range   Sodium 139  135 - 145 mEq/L   Potassium 2.9 (*) 3.5 - 5.1 mEq/L   Chloride 103  96 - 112 mEq/L   CO2 21  19 - 32 mEq/L   Glucose, Bld 155 (*) 70 - 99 mg/dL   BUN 33 (*) 6 - 23 mg/dL   Creatinine, Ser 2.20 (*) 0.50 - 1.35 mg/dL   Calcium 8.6  8.4 - 10.5 mg/dL   GFR calc non Af Amer 29 (*) >90 mL/min   GFR calc Af Amer 33 (*) >90 mL/min  GLUCOSE, CAPILLARY     Status: Abnormal   Collection Time    01/01/13  2:02 AM      Result Value Range   Glucose-Capillary 158 (*) 70 - 99 mg/dL  BLOOD GAS, ARTERIAL     Status: Abnormal   Collection Time    01/01/13  3:45 AM      Result Value Range   FIO2 0.80     Delivery systems VENTILATOR     Mode PRESSURE REGULATED VOLUME CONTROL     VT 0.550     Rate 35     Peep/cpap 10.0     pH, Arterial 7.553 (*) 7.350 - 7.450   pCO2 arterial 25.2 (*) 35.0 - 45.0 mmHg   pO2, Arterial 237.0 (*) 80.0 - 100.0 mmHg   Bicarbonate 22.2  20.0 - 24.0 mEq/L   TCO2 23.0  0 - 100 mmol/L   Acid-base deficit 0.0  0.0 - 2.0 mmol/L   O2 Saturation 99.3     Patient temperature 98.6     Collection site A-LINE     Drawn by [id number]     Sample type ARTERIAL DRAW    GLUCOSE, CAPILLARY     Status: Abnormal   Collection Time    01/01/13  5:00 AM      Result Value Range   Glucose-Capillary 152 (*) 70 - 99 mg/dL  PROTIME-INR  Status: Abnormal   Collection Time    01/01/13  5:00 AM      Result Value Range   Prothrombin Time 28.5 (*) 11.6 - 15.2 seconds   INR 2.86 (*) 0.00 - 1.49  CBC     Status: Abnormal   Collection Time    01/01/13  5:05 AM      Result Value Range   WBC 6.3  4.0 - 10.5 K/uL   RBC 6.14 (*) 4.22 - 5.81 MIL/uL   Hemoglobin 17.6 (*) 13.0 - 17.0 g/dL   HCT 44.2  39.0 - 52.0 %   MCV 72.0 (*) 78.0 - 100.0 fL   MCH 28.7  26.0 - 34.0 pg   MCHC 39.8 (*) 30.0 - 36.0 g/dL   RDW 13.7  11.5 - 15.5 %   Platelets 165  150 - 400 K/uL  BASIC METABOLIC PANEL     Status: Abnormal   Collection Time    01/01/13  5:05 AM      Result Value Range    Sodium 136  135 - 145 mEq/L   Potassium 3.6  3.5 - 5.1 mEq/L   Chloride 100  96 - 112 mEq/L   CO2 23  19 - 32 mEq/L   Glucose, Bld 155 (*) 70 - 99 mg/dL   BUN 35 (*) 6 - 23 mg/dL   Creatinine, Ser 2.16 (*) 0.50 - 1.35 mg/dL   Calcium 8.4  8.4 - 10.5 mg/dL   GFR calc non Af Amer 29 (*) >90 mL/min   GFR calc Af Amer 34 (*) >90 mL/min  MAGNESIUM     Status: None   Collection Time    01/01/13  5:05 AM      Result Value Range   Magnesium 2.0  1.5 - 2.5 mg/dL  PHOSPHORUS     Status: Abnormal   Collection Time    01/01/13  5:05 AM      Result Value Range   Phosphorus 1.3 (*) 2.3 - 4.6 mg/dL  POCT I-STAT 3, BLOOD GAS (G3+)     Status: Abnormal   Collection Time    01/01/13  5:48 AM      Result Value Range   pH, Arterial 7.589 (*) 7.350 - 7.450   pCO2 arterial 25.7 (*) 35.0 - 45.0 mmHg   pO2, Arterial 245.0 (*) 80.0 - 100.0 mmHg   Bicarbonate 25.5 (*) 20.0 - 24.0 mEq/L   TCO2 26  0 - 100 mmol/L   O2 Saturation 100.0     Acid-Base Excess 4.0 (*) 0.0 - 2.0 mmol/L   Patient temperature 32.8 C     Sample type ARTERIAL    GLUCOSE, CAPILLARY     Status: Abnormal   Collection Time    01/01/13  6:28 AM      Result Value Range   Glucose-Capillary 122 (*) 70 - 99 mg/dL  GLUCOSE, CAPILLARY     Status: Abnormal   Collection Time    01/01/13  7:21 AM      Result Value Range   Glucose-Capillary 100 (*) 70 - 99 mg/dL  LACTIC ACID, PLASMA     Status: Abnormal   Collection Time    01/01/13  7:59 AM      Result Value Range   Lactic Acid, Venous 3.2 (*) 0.5 - 2.2 mmol/L  BASIC METABOLIC PANEL     Status: Abnormal   Collection Time    01/01/13  7:59 AM      Result Value Range  Sodium 137  135 - 145 mEq/L   Potassium 3.6  3.5 - 5.1 mEq/L   Chloride 101  96 - 112 mEq/L   CO2 25  19 - 32 mEq/L   Glucose, Bld 107 (*) 70 - 99 mg/dL   BUN 35 (*) 6 - 23 mg/dL   Creatinine, Ser 2.20 (*) 0.50 - 1.35 mg/dL   Calcium 8.3 (*) 8.4 - 10.5 mg/dL   GFR calc non Af Amer 29 (*) >90 mL/min   GFR calc  Af Amer 33 (*) >90 mL/min  POCT ACTIVATED CLOTTING TIME     Status: None   Collection Time    01/01/13  8:42 AM      Result Value Range   Activated Clotting Time 181      Intake/Output Summary (Last 24 hours) at 01/01/13 [id number] Last data filed at 01/01/13 0800  Gross per 24 hour  Intake 5969.85 ml  Output   2135 ml  Net 3834.85 ml    ASSESSMENT AND PLAN:  Cardiac arrest/ VF (ventricular fibrillation):  Cooled and warming has begun.  I will stop the lidocaine.  If he has any recurrent arrhythmia we can consider amiodarone.  I will repeat liver enzymes.      Acute on chronic renal insufficiency:  Creat remains stable.    CAD:  Needs sheath to be pulled.  We have tried to bring the INR down.  It remains elevated but is lower than previous.  The patient has been treated with vitamin Logan White 01/01/2013 9:09 AM

## 2013-01-01 NOTE — Progress Notes (Signed)
eLink Physician-Brief Progress Note Patient Name: Logan White DOB: 11/17/1940 MRN: [id number]  Date of Service  01/01/2013   HPI/Events of Note  resp alkalosis   eICU Interventions  Decrease in RR from 35 to 25.  Recheck ABG in 1 hour post vent change   Intervention Category Major Interventions: Acid-Base disturbance - evaluation and management  DETERDING,ELIZABETH 01/01/2013, 4:17 AM

## 2013-01-01 NOTE — Progress Notes (Signed)
PULMONARY  / CRITICAL CARE MEDICINE  Name: Logan White MRN: [id number] DOB: Jan 25, 1941    ADMISSION DATE:  12/30/2012  REFERRING MD :  Dr. Tomi Bamberger PRIMARY SERVICE: PCCM  CHIEF COMPLAINT:  Cardiac arrest  BRIEF PATIENT DESCRIPTION:  Mr. Gibney is a 72 year old man with PMH of ischemic cardiomyopathy, A.fib on warfarin, CVA, DM2, CKD, HTN, and previous cardiac arrest who presented to the [facility] via EMS with VF/VT arrest with possible STEMI, gained pulse, started on [facility] protocol to cardiac cath.   SIGNIFICANT EVENTS / STUDIES:  4/27 VT/VT Cardiac arrest with ~30 minutes of CPR  4/27 Initiation of [facility] protocol 4/27 CXR cardiomegaly with mild pulmonary edema 4/28 Cardiac cath- no culprit new lesions.  LINES / TUBES: Left femoral CL 4/27>>> ETT 4/27>>> OGT 4/27>> Foley 4/27>>>   CULTURES: NA  ANTIBIOTICS: NA  SUBJECTIVE:  Cardiac arrest, pulse after 63min of CPR, multiple doses of epi and shock [facility] protocol Cardiac Cath 4/28 early am. On norepinephrine and dopamine drip  VITAL SIGNS: Temp:  [90.9 F (32.7 C)-92.7 F (33.7 C)] 92.7 F (33.7 C) (04/29 0800) Pulse Rate:  [56-74] 74 (04/29 0802) Resp:  [18-35] 24 (04/29 0802) BP: (119-151)/(71-87) 134/75 mmHg (04/29 0802) SpO2:  [82 %-100 %] 100 % (04/29 0840) FiO2 (%):  [60 %-80 %] 60 % (04/29 0802) Weight:  [184 lb 1.4 oz (83.5 kg)] 184 lb 1.4 oz (83.5 kg) (04/28 1500) HEMODYNAMICS:   VENTILATOR SETTINGS: Vent Mode:  [-] PRVC FiO2 (%):  [60 %-80 %] 60 % Set Rate:  [24 bmp-35 bmp] 24 bmp Vt Set:  [550 mL] 550 mL PEEP:  [5 cmH20-10 cmH20] 10 cmH20 Plateau Pressure:  [24 cmH20-30 cmH20] 24 cmH20 INTAKE / OUTPUT: Intake/Output     04/28 0701 - 04/29 0700 04/29 0701 - 04/30 0700   I.V. (mL/kg) 5557.8 (66.6)    Other 50    NG/GT 30    IV Piggyback 850    Total Intake(mL/kg) 6487.8 (77.7)    Urine (mL/kg/hr) 740 (0.4)    Emesis/NG output     Stool 1425 (0.7)    Total Output 2165     Net  +4322.8           PHYSICAL EXAMINATION: General:  Sedated and intubated Neuro:  Sedated and paralyzed. HEENT: ETT in place. Cardiovascular:  S1, s2, irregularly irregular rhythm. Lungs:  Bibasilar crackles, mechanical breath sounds. Abdomen:  Soft, no distended. Musculoskeletal:  Good tone. Skin:  Intact.  LABS:  Recent Labs Lab 12/30/12 2312  12/31/12 0230 12/31/12 0231  12/31/12 0700 12/31/12 0818 12/31/12 0828 12/31/12 1008 12/31/12 1210 12/31/12 1431 12/31/12 1459 12/31/12 1700 12/31/12 2059 12/31/12 2115 01/01/13 01/01/13 0345 01/01/13 0500 01/01/13 0505 01/01/13 0548  HGB 14.5  < > 16.7  --   --   --   --   --  18.4*  --   --   --   --   --   --   --   --   --  17.6*  --   WBC 8.9  --  14.2*  --   --   --   --   --   --   --   --   --   --   --   --   --   --   --  6.3  --   PLT 101*  --  207  --   --   --   --   --   --   --   --   --   --   --   --   --   --   --  165  --   NA 142  < > 135  --   < > 138  --   --  140 136  --   --  138  --  139 139  --   --  136  --   K 3.9  < > 3.9  --   < > 2.7*  --   --  2.9* 2.8*  --   --  2.9*  --  3.4* 2.9*  --   --  3.6  --   CL 102  < > 105  --   < > 103  --   --  111 105  --   --  106  --  105 103  --   --  100  --   CO2 18*  --  15*  --   < > 16*  --   --   --  16*  --   --  19  --  20 21  --   --  23  --   GLUCOSE 307*  < > 240*  --   < > 295*  --   --  265* 217*  --   --  135*  --  139* 155*  --   --  155*  --   BUN 16  < > 22  --   < > 25*  --   --  27* 28*  --   --  32*  --  32* 33*  --   --  35*  --   CREATININE 1.78*  < > 2.02*  --   < > 2.16*  --   --  2.30* 2.17*  --   --  2.25*  --  2.18* 2.20*  --   --  2.16*  --   CALCIUM 13.4*  --  9.9  --   < > 9.8  --   --   --  9.5  --   --  9.2  --  8.8 8.6  --   --  8.4  --   MG  --   --  3.4*  --   --   --   --   --   --   --   --   --   --   --   --   --   --   --  2.0  --   PHOS  --   --  6.2*  --   --   --   --   --   --   --   --   --   --   --   --   --   --   --   1.3*  --   AST 135*  --  282*  --   --   --   --   --   --   --   --   --   --   --   --   --   --   --   --   --   ALT 109*  --  154*  --   --   --   --   --   --   --   --   --   --   --   --   --   --   --   --   --   ALKPHOS 135*  --  195*  --   --   --   --   --   --   --   --   --   --   --   --   --   --   --   --   --  BILITOT 0.2*  --  0.4  --   --   --   --   --   --   --   --   --   --   --   --   --   --   --   --   --   PROT 5.2*  --  6.3  --   --   --   --   --   --   --   --   --   --   --   --   --   --   --   --   --   ALBUMIN 2.3*  --  2.9*  --   --   --   --   --   --   --   --   --   --   --   --   --   --   --   --   --   APTT 54*  --  163*  --   --   --   --   --   --  75*  --   --   --   --   --   --   --   --   --   --   INR 2.72*  --  9.05*  --   --   --   --   --   --  3.64*  --   --  3.62*  --  3.75*  --   --  2.86*  --   --   LATICACIDVEN  --   --   --   --   --   --   --  5.8*  --   --   --  3.6*  --  3.4*  --   --   --   --   --   --   TROPONINI 0.35*  --   --  2.78*  --  6.90*  --   --   --   --  19.30*  --   --   --   --   --   --   --   --   --   PHART  --   < >  --   --   --   --  7.359  --   --   --   --   --   --   --   --   --  7.553*  --   --  7.589*  PCO2ART  --   < >  --   --   --   --  25.0*  --   --   --   --   --   --   --   --   --  25.2*  --   --  25.7*  PO2ART  --   < >  --   --   --   --  112.0*  --   --   --   --   --   --   --   --   --  237.0*  --   --  245.0*  < > = values in this interval not displayed.  Recent Labs Lab 12/31/12 2348 01/01/13 0202 01/01/13 0500 01/01/13 0628 01/01/13 0721  GLUCAP 154* 158* 152* 122* 100*   CXR: 4/27 Cardiomegaly and mild pulmonary edema CXr: 4/28 No  new infiltrates. CXR: 4/29- increasing right mid and lower lung air space disease.  ASSESSMENT / PLAN:  PULMONARY A: VDRF - 2/2 cardiac arrest P:   - Full vent support. - pH 7.59 this morning- decreased RR to 24 and Fio2.  Stop bicarbonate drip. -  SBT once hypothermia is complete (around 6 PM today) and depending on neuro status. - Portable chest x-ray in a.m.  CARDIOVASCULAR A: VF/VT arrest-  STEMI- status post PCI and stenting of CFx, troponins 6.9<2.78<0.35 [facility] protocol P:  - Per cardiology. - Aspirin and Plavix. No statin secondary to elevated LFTs and no beta blocker or Ace inhibitors secondary to hypertension and AKI. - Continue [employer] protocol with paralysis. - Continue Levophed and dopamine for goal MAP >65. - Lidocaine drip per cardiology. We'll likely start amiodarone soon.  RENAL A:  Acute on chronic kidney disease. Baseline CKD stage 3 - Baseline Cr 1.5, 1.7 on admission Creatinine 2.2<2.16<2.02<1.78-likely secondary to PCI. Hypokalemia- 3.6<2.7<3.0<3.9 P:   - K replaced, Phosp replaced - IVF infusion per [facility] protocol - 2L at [facility] - Trend K, Mg, Phos  GASTROINTESTINAL A:  No active issues P:   - Protonix 40mg  IV daily for SUP.  HEMATOLOGIC A:   Leukocytosis- resolved. WBC 6.3 <14.2< 8.9. Likely stress-related. P:  - CBC in AM.  INFECTIOUS A:  No active issues P:   - Monitor fever curve and WBC once warmed.  ENDOCRINE A:  Hyperglycemia - undiagnosed DM2? P:   - CBG q 4h. - SSI.  NEUROLOGIC A:  Risk for brain anoxia 2/2 cardiac arrest P:   - Continue [facility] protocol - to be completed around 6 PM today. - Fentanyl and Versed for sedation while on vent - Paralyzed- Nimbex.  PATEL,RAVI, MD 8:54 AM  TODAY'S SUMMARY: Cardiac arrest 2/2 STEMI. CPR for ~30 minutes with return of pulse. Intubated. [employer] protocol initiated, cath lab- PCI to circumflex- flow not restored. LAD 80% apical lesion. Hypothermia protocol to be completed around 6 PM today.  I have personally obtained a history, examined the patient, evaluated laboratory and imaging results, formulated the assessment and plan and placed orders.  CRITICAL CARE: The patient is critically ill with multiple organ systems  failure and requires high complexity decision making for assessment and support, frequent evaluation and titration of therapies, application of advanced monitoring technologies and extensive interpretation of multiple databases. Critical Care Time devoted to patient care services described in this note is 35 minutes.   Rush Farmer, M.D. Pulmonary and [place] Pager: [phone]  01/01/2013, 8:54 AM

## 2013-01-02 ENCOUNTER — Inpatient Hospital Stay (HOSPITAL_COMMUNITY): Payer: Medicare Other

## 2013-01-02 ENCOUNTER — Encounter (HOSPITAL_COMMUNITY): Payer: Self-pay | Admitting: *Deleted

## 2013-01-02 DIAGNOSIS — R404 Transient alteration of awareness: Secondary | ICD-10-CM

## 2013-01-02 LAB — BLOOD GAS, ARTERIAL
Acid-base deficit: 0.4 mmol/L (ref 0.0–2.0)
Drawn by: 34779
FIO2: 0.4 %
MECHVT: 550 mL
RATE: 24 resp/min
pCO2 arterial: 30.2 mmHg — ABNORMAL LOW (ref 35.0–45.0)
pH, Arterial: 7.487 — ABNORMAL HIGH (ref 7.350–7.450)
pO2, Arterial: 119 mmHg — ABNORMAL HIGH (ref 80.0–100.0)

## 2013-01-02 LAB — CBC
HCT: 42.5 % (ref 39.0–52.0)
MCHC: 38.6 g/dL — ABNORMAL HIGH (ref 30.0–36.0)
RDW: 13.9 % (ref 11.5–15.5)
WBC: 12.5 10*3/uL — ABNORMAL HIGH (ref 4.0–10.5)

## 2013-01-02 LAB — LACTIC ACID, PLASMA
Lactic Acid, Venous: 2.6 mmol/L — ABNORMAL HIGH (ref 0.5–2.2)
Lactic Acid, Venous: 3 mmol/L — ABNORMAL HIGH (ref 0.5–2.2)

## 2013-01-02 LAB — BASIC METABOLIC PANEL
CO2: 24 mEq/L (ref 19–32)
Calcium: 7.6 mg/dL — ABNORMAL LOW (ref 8.4–10.5)
Chloride: 102 mEq/L (ref 96–112)
GFR calc Af Amer: 28 mL/min — ABNORMAL LOW (ref >90)
Sodium: 141 mEq/L (ref 135–145)

## 2013-01-02 LAB — GLUCOSE, CAPILLARY
Glucose-Capillary: 84 mg/dL (ref 70–99)
Glucose-Capillary: 108 mg/dL — ABNORMAL HIGH (ref 70–99)
Glucose-Capillary: 105 mg/dL — ABNORMAL HIGH (ref 70–99)

## 2013-01-02 LAB — MAGNESIUM: Magnesium: 1.7 mg/dL (ref 1.5–2.5)

## 2013-01-02 LAB — PHOSPHORUS: Phosphorus: 5.8 mg/dL — ABNORMAL HIGH (ref 2.3–4.6)

## 2013-01-02 MED ORDER — FUROSEMIDE 10 MG/ML IJ SOLN
40.0000 mg | Freq: Three times a day (TID) | INTRAMUSCULAR | Status: AC
  Administered 2013-01-02 (×2): 40 mg via INTRAVENOUS
  Filled 2013-01-02 (×2): qty 4

## 2013-01-02 MED ORDER — PRO-STAT SUGAR FREE PO LIQD
30.0000 mL | Freq: Three times a day (TID) | ORAL | Status: DC
  Administered 2013-01-02 – 2013-01-04 (×7): 30 mL
  Filled 2013-01-02 (×11): qty 30

## 2013-01-02 MED ORDER — OSMOLITE 1.2 CAL PO LIQD
1000.0000 mL | ORAL | Status: DC
  Administered 2013-01-02: 1000 mL
  Filled 2013-01-02 (×6): qty 1000

## 2013-01-02 NOTE — Progress Notes (Deleted)
Spoke with patient's children and wife (step mother).  She is clearly elderly and is unable to process information adequately.  I would imagine there is some baseline dementia here.  After discussion however, decision was made to make patient LCB with no CPR and no cardioversion with the wife's agreement.  I will not change code status based on wife's input alone as she exhibited clear signs of inability to process the complex information given to her that required very frequent repetition to the extent that some family members started telling her that a particular topic has already been handled.  Additional CC time 45 min.  Rush Farmer, M.D. [facility] Pulmonary/Critical Care Medicine. Pager: [phone]. After hours pager: [phone].

## 2013-01-02 NOTE — Progress Notes (Signed)
Acute hypotension;   Pressors titrated up; now more on the hypertensive site. Cont to monitor;

## 2013-01-02 NOTE — Progress Notes (Signed)
Portable EEG completed

## 2013-01-02 NOTE — Consult Note (Signed)
NEURO HOSPITALIST CONSULT NOTE    Reason for Consult: prognosis  HPI:                                                                                                                                          Logan White is an 72 y.o. male of ischemic cardiomyopathy, afib on warfarin, CVA, DM2, CKD, HTN, and previous cardiac arrest who presented the the ED with VF/VT arrest, possible STEMI, CPR for estimated 30 minutes. Pulses were gained and patient was started on the [employer] protocol. He was rewarmed at 1700 hours on 4/29.  Head CT shows no acute abnormalities with good gray white differention. Neurology was called for prognostication.   Past Medical History  Diagnosis Date  . Inguinal hernia without mention of obstruction or gangrene, unilateral or unspecified, (not specified as recurrent)   . 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 Peripheral vascular disease, unspecified   . Carotid stenosis     dopplers 1/11: 60-79% bilat.  . Hyperlipidemia     mixed  . Hypertension     a. echo 4/12: EF 50%, asymmetric septal hypertrophy, no SAM or LVOT gradient, LAE, PASP 35  . Coronary artery disease     a. s/p aborted ant STEMI tx with Cypher DES to LAD 10/04 (residual at cath: D1 50%, CFX 40% and multiple dist 70%, EF 55%);   b. myoview 3/10: Ef 47%, infero-apical isch, LOW RISK - med Tx recommended  . CKD (chronic kidney disease)   . Paroxysmal atrial fibrillation 12/2010  . Diverticulosis     2001  . Diabetes mellitus   . H/O: CVA (cardiovascular accident)   . Coronary artery disease     a. s/p aborted ant STEMI s/p DES to LAD 06/2003.  Marland Kitchen Hypertension   . Glucose intolerance (impaired glucose tolerance)   . Pneumonia   . Carotid artery stenosis     01/2011 - 40-59% bilateral stenosis  . Peripheral vascular disease     h/o LE angioplasty  . 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 Cardiomyopathy     a. EF 50-55% by echo 03/09/12 (was 30% by echo 02/24/12)  . Chronic renal insufficiency   . Paroxysmal atrial fibrillation   . Mitral regurgitation     Mild by echo 03/2012  . Hematemesis     12/2010 felt 2/2 Mallory Weiss tear - pt could not afford colonscopy/EGD at that time so Coumadin was deferred. Coumadin initiated 03/2012 without any evidence for bleeding.  Marland Kitchen Hyperlipidemia   . Inguinal hernia   . Diverticulosis 2001  . QT prolongation   .  Bradycardia     Requiring discontinuation of use of BB/CCB  . Elevated LFTs     Shock liver 03/2012    Past Surgical History  Procedure Laterality Date  . Popliteal artery angioplasty  01/07/07    s/p LEFT POPLITEAL ARTERY EXPLORATION AND VEIN PATCH ANGIOPLASTY, PER DR. EARLY, SECONDARY TO ISCHEMIC LEFT FOOT RELATED TO LEFT POPLITEAL ARTERY INJURY  . Orif tibia & fibula fractures  01/11/07    OPEN TX OF UNICONDYLAR PLATEAU FRACTURE, IRRIGATION/DEBRIDEMENT OF OPEN FRACTURE INCLUDING BONE, REMOVAL OF EXTERNAL FIXATOR UNDER ANESTHESIA PER DR. MICHAEL HANDY  . Hernia repair    . Skin graft      Family History  Problem Relation Age of Onset  . Heart disease Father     ALSO UNCLE DIED HAD CAD  . Brain cancer Mother   . Leukemia Mother   . Sickle cell anemia Mother   . Heart failure Father   . Sickle cell anemia Other      Social History:  reports that he quit smoking about 9 months ago. His smoking use included Cigarettes. He has a 15 pack-year smoking history. He does not have any smokeless tobacco history on file. He reports that he uses illicit drugs (Marijuana) about twice per week. He reports that he does not drink alcohol.  No Known Allergies  MEDICATIONS:                                                                                                                     Prior to Admission:   Prescriptions prior to admission  Medication Sig Dispense Refill  . nitroGLYCERIN (NITROSTAT) 0.4 MG SL tablet Place 1 tablet (0.4 mg total) under the tongue every 5 (five) minutes as needed for chest pain (Up to 3 doses).  25 tablet  4  . potassium chloride SA (K-DUR,KLOR-CON) 20 MEQ tablet one tablet daily      . spironolactone (ALDACTONE) 25 MG tablet TAKE ONE TABLET BY MOUTH DAILY  30 tablet  5  . warfarin (COUMADIN) 2.5 MG tablet Take 1 tablet (2.5 mg total) by mouth as directed.  35 tablet  2   Scheduled: . antiseptic oral rinse  15 mL Mouth Rinse QID  . aspirin  81 mg Per NG tube Daily  . chlorhexidine  15 mL Mouth Rinse BID  . Chlorhexidine Gluconate Cloth  6 each Topical Q0600  . clopidogrel  75 mg Per NG tube Q breakfast  . feeding supplement  30 mL Per Tube TID  . furosemide  40 mg Intravenous Q8H  . insulin aspart  2-6 Units Subcutaneous Q4H  . insulin glargine  20 Units Subcutaneous Q24H  . mupirocin ointment  1 application Nasal BID  . pantoprazole (PROTONIX) IV  40 mg Intravenous Q24H     ROS:  History obtained from unobtainable from patient due to unresponsiveness    Blood pressure 181/65, pulse 78, temperature 99.5 F (37.5 C), temperature source Core (Comment), resp. rate 13, height 6\' 2"  (1.88 m), weight 83.5 kg (184 lb 1.4 oz), SpO2 98.00%.   Neurologic Examination:                                                                                                      Mental Status: Patient does not respond to verbal stimuli.  Opens eyes temporarily in responce to deep sternal rub.  Does not follow commands.  No verbalizations but intubated and breathing over vent Cranial Nerves: II: patient does not respond confrontation bilaterally, pupils right 1 mm, left 1 mm,and sluggishly reactive bilaterally III,IV,VI: doll's response absent  bilaterally. Eyes disconjugate V,VII: corneal reflex present bilaterally  VIII: patient does not respond to verbal stimuli IX,X: gag reflex absent, XI: trapezius strength unable to test bilaterally XII: tongue strength unable to test Motor: Extremities flaccid throughout.  Does respond to noxious stimuli in bilateral arms and withdraws--no response in LE Sensory: As above Deep Tendon Reflexes:  2+ in UE at bicep and brachioradialis no KJ and no AJ Plantars: absent bilaterally Cerebellar: Unable to perform    Lab Results  Component Value Date/Time   CHOL 135 08/23/2012  9:02 AM    Results for orders placed during the hospital encounter of 12/30/12 (from the past 48 hour(s))  GLUCOSE, CAPILLARY     Status: Abnormal   Collection Time    12/31/12  2:08 PM      Result Value Range   Glucose-Capillary 183 (*) 70 - 99 mg/dL  TROPONIN I     Status: Abnormal   Collection Time    12/31/12  2:31 PM      Result Value Range   Troponin I 19.30 (*) <0.30 ng/mL   Comment:            Due to the release kinetics of cTnI,     a negative result within the first hours     of the onset of symptoms does not rule out     myocardial infarction with certainty.     If myocardial infarction is still suspected,     repeat the test at appropriate intervals.     CRITICAL VALUE NOTED.  VALUE IS CONSISTENT WITH PREVIOUSLY REPORTED AND CALLED VALUE.  LACTIC ACID, PLASMA     Status: Abnormal   Collection Time    12/31/12  2:59 PM      Result Value Range   Lactic Acid, Venous 3.6 (*) 0.5 - 2.2 mmol/L  GLUCOSE, CAPILLARY     Status: Abnormal   Collection Time    12/31/12  3:00 PM      Result Value Range   Glucose-Capillary 175 (*) 70 - 99 mg/dL  GLUCOSE, CAPILLARY     Status: Abnormal   Collection Time    12/31/12  4:03 PM      Result Value Range   Glucose-Capillary 159 (*) 70 - 99 mg/dL  PROTIME-INR  Status: Abnormal   Collection Time    12/31/12  5:00 PM      Result Value Range    Prothrombin Time 34.0 (*) 11.6 - 15.2 seconds   INR 3.62 (*) 0.00 - 99991111  BASIC METABOLIC PANEL     Status: Abnormal   Collection Time    12/31/12  5:00 PM      Result Value Range   Sodium 138  135 - 145 mEq/L   Potassium 2.9 (*) 3.5 - 5.1 mEq/L   Chloride 106  96 - 112 mEq/L   CO2 19  19 - 32 mEq/L   Glucose, Bld 135 (*) 70 - 99 mg/dL   BUN 32 (*) 6 - 23 mg/dL   Creatinine, Ser 2.25 (*) 0.50 - 1.35 mg/dL   Calcium 9.2  8.4 - 10.5 mg/dL   GFR calc non Af Amer 28 (*) >90 mL/min   GFR calc Af Amer 32 (*) >90 mL/min   Comment:            The eGFR has been calculated     using the CKD EPI equation.     This calculation has not been     validated in all clinical     situations.     eGFR's persistently     <90 mL/min signify     possible Chronic Kidney Disease.  GLUCOSE, CAPILLARY     Status: Abnormal   Collection Time    12/31/12  5:02 PM      Result Value Range   Glucose-Capillary 138 (*) 70 - 99 mg/dL  GLUCOSE, CAPILLARY     Status: Abnormal   Collection Time    12/31/12  6:03 PM      Result Value Range   Glucose-Capillary 128 (*) 70 - 99 mg/dL  GLUCOSE, CAPILLARY     Status: Abnormal   Collection Time    12/31/12  7:25 PM      Result Value Range   Glucose-Capillary 128 (*) 70 - 99 mg/dL  GLUCOSE, CAPILLARY     Status: Abnormal   Collection Time    12/31/12  8:21 PM      Result Value Range   Glucose-Capillary 122 (*) 70 - 99 mg/dL  LACTIC ACID, PLASMA     Status: Abnormal   Collection Time    12/31/12  8:59 PM      Result Value Range   Lactic Acid, Venous 3.4 (*) 0.5 - 2.2 mmol/L  BASIC METABOLIC PANEL     Status: Abnormal   Collection Time    12/31/12  9:15 PM      Result Value Range   Sodium 139  135 - 145 mEq/L   Potassium 3.4 (*) 3.5 - 5.1 mEq/L   Chloride 105  96 - 112 mEq/L   CO2 20  19 - 32 mEq/L   Glucose, Bld 139 (*) 70 - 99 mg/dL   BUN 32 (*) 6 - 23 mg/dL   Creatinine, Ser 2.18 (*) 0.50 - 1.35 mg/dL   Calcium 8.8  8.4 - 10.5 mg/dL   GFR calc non Af  Amer 29 (*) >90 mL/min   GFR calc Af Amer 34 (*) >90 mL/min   Comment:            The eGFR has been calculated     using the CKD EPI equation.     This calculation has not been     validated in all clinical     situations.  eGFR's persistently     <90 mL/min signify     possible Chronic Kidney Disease.  PROTIME-INR     Status: Abnormal   Collection Time    12/31/12  9:15 PM      Result Value Range   Prothrombin Time 34.9 (*) 11.6 - 15.2 seconds   INR 3.75 (*) 0.00 - 1.49  GLUCOSE, CAPILLARY     Status: Abnormal   Collection Time    12/31/12  9:15 PM      Result Value Range   Glucose-Capillary 138 (*) 70 - 99 mg/dL  GLUCOSE, CAPILLARY     Status: Abnormal   Collection Time    12/31/12 11:48 PM      Result Value Range   Glucose-Capillary 154 (*) 70 - 99 mg/dL  BASIC METABOLIC PANEL     Status: Abnormal   Collection Time    01/01/13 12:00 AM      Result Value Range   Sodium 139  135 - 145 mEq/L   Potassium 2.9 (*) 3.5 - 5.1 mEq/L   Chloride 103  96 - 112 mEq/L   CO2 21  19 - 32 mEq/L   Glucose, Bld 155 (*) 70 - 99 mg/dL   BUN 33 (*) 6 - 23 mg/dL   Creatinine, Ser 2.20 (*) 0.50 - 1.35 mg/dL   Calcium 8.6  8.4 - 10.5 mg/dL   GFR calc non Af Amer 29 (*) >90 mL/min   GFR calc Af Amer 33 (*) >90 mL/min   Comment:            The eGFR has been calculated     using the CKD EPI equation.     This calculation has not been     validated in all clinical     situations.     eGFR's persistently     <90 mL/min signify     possible Chronic Kidney Disease.  GLUCOSE, CAPILLARY     Status: Abnormal   Collection Time    01/01/13  2:02 AM      Result Value Range   Glucose-Capillary 158 (*) 70 - 99 mg/dL  BLOOD GAS, ARTERIAL     Status: Abnormal   Collection Time    01/01/13  3:45 AM      Result Value Range   FIO2 0.80     Delivery systems VENTILATOR     Mode PRESSURE REGULATED VOLUME CONTROL     VT 0.550     Rate 35     Peep/cpap 10.0     pH, Arterial 7.553 (*) 7.350 -  7.450   pCO2 arterial 25.2 (*) 35.0 - 45.0 mmHg   pO2, Arterial 237.0 (*) 80.0 - 100.0 mmHg   Bicarbonate 22.2  20.0 - 24.0 mEq/L   TCO2 23.0  0 - 100 mmol/L   Acid-base deficit 0.0  0.0 - 2.0 mmol/L   O2 Saturation 99.3     Patient temperature 98.6     Collection site A-LINE     Drawn by [id number]     Sample type ARTERIAL DRAW    GLUCOSE, CAPILLARY     Status: Abnormal   Collection Time    01/01/13  5:00 AM      Result Value Range   Glucose-Capillary 152 (*) 70 - 99 mg/dL  PROTIME-INR     Status: Abnormal   Collection Time    01/01/13  5:00 AM      Result Value Range   Prothrombin Time 28.5 (*) 11.6 -  15.2 seconds   INR 2.86 (*) 0.00 - 1.49  CBC     Status: Abnormal   Collection Time    01/01/13  5:05 AM      Result Value Range   WBC 6.3  4.0 - 10.5 K/uL   RBC 6.14 (*) 4.22 - 5.81 MIL/uL   Hemoglobin 17.6 (*) 13.0 - 17.0 g/dL   HCT 44.2  39.0 - 52.0 %   MCV 72.0 (*) 78.0 - 100.0 fL   MCH 28.7  26.0 - 34.0 pg   MCHC 39.8 (*) 30.0 - 36.0 g/dL   Comment: TARGET CELLS   RDW 13.7  11.5 - 15.5 %   Platelets 165  150 - 400 K/uL  BASIC METABOLIC PANEL     Status: Abnormal   Collection Time    01/01/13  5:05 AM      Result Value Range   Sodium 136  135 - 145 mEq/L   Potassium 3.6  3.5 - 5.1 mEq/L   Comment: DELTA CHECK NOTED     NO VISIBLE HEMOLYSIS   Chloride 100  96 - 112 mEq/L   CO2 23  19 - 32 mEq/L   Glucose, Bld 155 (*) 70 - 99 mg/dL   BUN 35 (*) 6 - 23 mg/dL   Creatinine, Ser 2.16 (*) 0.50 - 1.35 mg/dL   Calcium 8.4  8.4 - 10.5 mg/dL   GFR calc non Af Amer 29 (*) >90 mL/min   GFR calc Af Amer 34 (*) >90 mL/min   Comment:            The eGFR has been calculated     using the CKD EPI equation.     This calculation has not been     validated in all clinical     situations.     eGFR's persistently     <90 mL/min signify     possible Chronic Kidney Disease.  MAGNESIUM     Status: None   Collection Time    01/01/13  5:05 AM      Result Value Range   Magnesium  2.0  1.5 - 2.5 mg/dL  PHOSPHORUS     Status: Abnormal   Collection Time    01/01/13  5:05 AM      Result Value Range   Phosphorus 1.3 (*) 2.3 - 4.6 mg/dL  POCT I-STAT 3, BLOOD GAS (G3+)     Status: Abnormal   Collection Time    01/01/13  5:48 AM      Result Value Range   pH, Arterial 7.589 (*) 7.350 - 7.450   pCO2 arterial 25.7 (*) 35.0 - 45.0 mmHg   pO2, Arterial 245.0 (*) 80.0 - 100.0 mmHg   Bicarbonate 25.5 (*) 20.0 - 24.0 mEq/L   TCO2 26  0 - 100 mmol/L   O2 Saturation 100.0     Acid-Base Excess 4.0 (*) 0.0 - 2.0 mmol/L   Patient temperature 32.8 C     Sample type ARTERIAL    GLUCOSE, CAPILLARY     Status: Abnormal   Collection Time    01/01/13  6:28 AM      Result Value Range   Glucose-Capillary 122 (*) 70 - 99 mg/dL  GLUCOSE, CAPILLARY     Status: Abnormal   Collection Time    01/01/13  7:21 AM      Result Value Range   Glucose-Capillary 100 (*) 70 - 99 mg/dL  LACTIC ACID, PLASMA     Status: Abnormal  Collection Time    01/01/13  7:59 AM      Result Value Range   Lactic Acid, Venous 3.2 (*) 0.5 - 2.2 mmol/L  BASIC METABOLIC PANEL     Status: Abnormal   Collection Time    01/01/13  7:59 AM      Result Value Range   Sodium 137  135 - 145 mEq/L   Potassium 3.6  3.5 - 5.1 mEq/L   Chloride 101  96 - 112 mEq/L   CO2 25  19 - 32 mEq/L   Glucose, Bld 107 (*) 70 - 99 mg/dL   BUN 35 (*) 6 - 23 mg/dL   Creatinine, Ser 2.20 (*) 0.50 - 1.35 mg/dL   Calcium 8.3 (*) 8.4 - 10.5 mg/dL   GFR calc non Af Amer 29 (*) >90 mL/min   GFR calc Af Amer 33 (*) >90 mL/min   Comment:            The eGFR has been calculated     using the CKD EPI equation.     This calculation has not been     validated in all clinical     situations.     eGFR's persistently     <90 mL/min signify     possible Chronic Kidney Disease.  HEPATIC FUNCTION PANEL     Status: Abnormal   Collection Time    01/01/13  7:59 AM      Result Value Range   Total Protein 5.3 (*) 6.0 - 8.3 g/dL   Albumin 2.2 (*)  3.5 - 5.2 g/dL   AST 264 (*) 0 - 37 U/L   ALT 125 (*) 0 - 53 U/L   Alkaline Phosphatase 149 (*) 39 - 117 U/L   Total Bilirubin 0.8  0.3 - 1.2 mg/dL   Bilirubin, Direct 0.2  0.0 - 0.3 mg/dL   Indirect Bilirubin 0.6  0.3 - 0.9 mg/dL  POCT ACTIVATED CLOTTING TIME     Status: None   Collection Time    01/01/13  8:42 AM      Result Value Range   Activated Clotting Time 0000000    BASIC METABOLIC PANEL     Status: Abnormal   Collection Time    01/01/13 12:00 PM      Result Value Range   Sodium 138  135 - 145 mEq/L   Potassium 3.6  3.5 - 5.1 mEq/L   Chloride 101  96 - 112 mEq/L   CO2 25  19 - 32 mEq/L   Glucose, Bld 72  70 - 99 mg/dL   BUN 35 (*) 6 - 23 mg/dL   Creatinine, Ser 2.16 (*) 0.50 - 1.35 mg/dL   Calcium 8.1 (*) 8.4 - 10.5 mg/dL   GFR calc non Af Amer 29 (*) >90 mL/min   GFR calc Af Amer 34 (*) >90 mL/min   Comment:            The eGFR has been calculated     using the CKD EPI equation.     This calculation has not been     validated in all clinical     situations.     eGFR's persistently     <90 mL/min signify     possible Chronic Kidney Disease.  GLUCOSE, CAPILLARY     Status: Abnormal   Collection Time    01/01/13 12:03 PM      Result Value Range   Glucose-Capillary 57 (*) 70 - 99 mg/dL  GLUCOSE,  CAPILLARY     Status: Abnormal   Collection Time    01/01/13 12:06 PM      Result Value Range   Glucose-Capillary 49 (*) 70 - 99 mg/dL  GLUCOSE, CAPILLARY     Status: None   Collection Time    01/01/13 12:31 PM      Result Value Range   Glucose-Capillary 99  70 - 99 mg/dL  GLUCOSE, CAPILLARY     Status: None   Collection Time    01/01/13  1:14 PM      Result Value Range   Glucose-Capillary 95  70 - 99 mg/dL  LACTIC ACID, PLASMA     Status: Abnormal   Collection Time    01/01/13  2:59 PM      Result Value Range   Lactic Acid, Venous 3.0 (*) 0.5 - 2.2 mmol/L  BASIC METABOLIC PANEL     Status: Abnormal   Collection Time    01/01/13  3:00 PM      Result Value Range    Sodium 138  135 - 145 mEq/L   Potassium 4.4  3.5 - 5.1 mEq/L   Comment: DELTA CHECK NOTED     NO VISIBLE HEMOLYSIS   Chloride 100  96 - 112 mEq/L   CO2 24  19 - 32 mEq/L   Glucose, Bld 86  70 - 99 mg/dL   BUN 36 (*) 6 - 23 mg/dL   Creatinine, Ser 2.22 (*) 0.50 - 1.35 mg/dL   Calcium 7.8 (*) 8.4 - 10.5 mg/dL   GFR calc non Af Amer 28 (*) >90 mL/min   GFR calc Af Amer 33 (*) >90 mL/min   Comment:            The eGFR has been calculated     using the CKD EPI equation.     This calculation has not been     validated in all clinical     situations.     eGFR's persistently     <90 mL/min signify     possible Chronic Kidney Disease.  GLUCOSE, CAPILLARY     Status: None   Collection Time    01/01/13  3:16 PM      Result Value Range   Glucose-Capillary 87  70 - 99 mg/dL  LACTIC ACID, PLASMA     Status: Abnormal   Collection Time    01/01/13  7:00 PM      Result Value Range   Lactic Acid, Venous 2.9 (*) 0.5 - 2.2 mmol/L  BASIC METABOLIC PANEL     Status: Abnormal   Collection Time    01/01/13  7:00 PM      Result Value Range   Sodium 140  135 - 145 mEq/L   Potassium 5.1  3.5 - 5.1 mEq/L   Chloride 102  96 - 112 mEq/L   CO2 26  19 - 32 mEq/L   Glucose, Bld 92  70 - 99 mg/dL   BUN 38 (*) 6 - 23 mg/dL   Creatinine, Ser 2.37 (*) 0.50 - 1.35 mg/dL   Calcium 7.7 (*) 8.4 - 10.5 mg/dL   GFR calc non Af Amer 26 (*) >90 mL/min   GFR calc Af Amer 31 (*) >90 mL/min   Comment:            The eGFR has been calculated     using the CKD EPI equation.     This calculation has not been     validated in  all clinical     situations.     eGFR's persistently     <90 mL/min signify     possible Chronic Kidney Disease.  GLUCOSE, CAPILLARY     Status: None   Collection Time    01/01/13  7:41 PM      Result Value Range   Glucose-Capillary 84  70 - 99 mg/dL  GLUCOSE, CAPILLARY     Status: None   Collection Time    01/02/13 12:16 AM      Result Value Range   Glucose-Capillary 81  70 - 99  mg/dL  LACTIC ACID, PLASMA     Status: Abnormal   Collection Time    01/02/13  2:45 AM      Result Value Range   Lactic Acid, Venous 3.2 (*) 0.5 - 2.2 mmol/L  GLUCOSE, CAPILLARY     Status: Abnormal   Collection Time    01/02/13  4:18 AM      Result Value Range   Glucose-Capillary 105 (*) 70 - 99 mg/dL  BASIC METABOLIC PANEL     Status: Abnormal   Collection Time    01/02/13  4:20 AM      Result Value Range   Sodium 141  135 - 145 mEq/L   Potassium 5.2 (*) 3.5 - 5.1 mEq/L   Chloride 102  96 - 112 mEq/L   CO2 24  19 - 32 mEq/L   Glucose, Bld 107 (*) 70 - 99 mg/dL   BUN 42 (*) 6 - 23 mg/dL   Creatinine, Ser 2.55 (*) 0.50 - 1.35 mg/dL   Calcium 7.6 (*) 8.4 - 10.5 mg/dL   GFR calc non Af Amer 24 (*) >90 mL/min   GFR calc Af Amer 28 (*) >90 mL/min   Comment:            The eGFR has been calculated     using the CKD EPI equation.     This calculation has not been     validated in all clinical     situations.     eGFR's persistently     <90 mL/min signify     possible Chronic Kidney Disease.  CBC     Status: Abnormal   Collection Time    01/02/13  4:20 AM      Result Value Range   WBC 12.5 (*) 4.0 - 10.5 K/uL   RBC 5.80  4.22 - 5.81 MIL/uL   Hemoglobin 16.4  13.0 - 17.0 g/dL   HCT 42.5  39.0 - 52.0 %   MCV 73.3 (*) 78.0 - 100.0 fL   MCH 28.3  26.0 - 34.0 pg   MCHC 38.6 (*) 30.0 - 36.0 g/dL   Comment: TARGET CELLS   RDW 13.9  11.5 - 15.5 %   Platelets 147 (*) 150 - 400 K/uL  MAGNESIUM     Status: None   Collection Time    01/02/13  4:20 AM      Result Value Range   Magnesium 1.7  1.5 - 2.5 mg/dL  PHOSPHORUS     Status: Abnormal   Collection Time    01/02/13  4:20 AM      Result Value Range   Phosphorus 5.8 (*) 2.3 - 4.6 mg/dL  BLOOD GAS, ARTERIAL     Status: Abnormal   Collection Time    01/02/13  4:22 AM      Result Value Range   FIO2 0.40     Delivery systems VENTILATOR  Mode PRESSURE REGULATED VOLUME CONTROL     VT 550     Rate 24     Peep/cpap 10.0      pH, Arterial 7.487 (*) 7.350 - 7.450   pCO2 arterial 30.2 (*) 35.0 - 45.0 mmHg   pO2, Arterial 119.0 (*) 80.0 - 100.0 mmHg   Bicarbonate 22.6  20.0 - 24.0 mEq/L   TCO2 23.6  0 - 100 mmol/L   Acid-base deficit 0.4  0.0 - 2.0 mmol/L   O2 Saturation 98.6     Patient temperature 98.6     Collection site A-LINE     Drawn by [phone]     Sample type ARTERIAL DRAW    GLUCOSE, CAPILLARY     Status: Abnormal   Collection Time    01/02/13  8:00 AM      Result Value Range   Glucose-Capillary 108 (*) 70 - 99 mg/dL  LACTIC ACID, PLASMA     Status: Abnormal   Collection Time    01/02/13 11:50 AM      Result Value Range   Lactic Acid, Venous 2.6 (*) 0.5 - 2.2 mmol/L  GLUCOSE, CAPILLARY     Status: Abnormal   Collection Time    01/02/13 11:55 AM      Result Value Range   Glucose-Capillary 116 (*) 70 - 99 mg/dL    Ct Head Wo Contrast  01/02/2013  *RADIOLOGY REPORT*  Clinical Data: Cardiac arrest, rule out anoxic brain injury.  On Coumadin  CT HEAD WITHOUT CONTRAST  Technique:  Contiguous axial images were obtained from the base of the skull through the vertex without contrast.  Comparison: CT head 01/07/2007  Findings: Ventricle size is normal.  Negative for acute infarct. Mild chronic microvascular ischemic changes in the white matter. No evidence of cortical edema.  Pearline Cables and white matter differentiation is maintained.  Negative for hemorrhage or mass.  No acute infarct or edema.  No shift to the midline structures.  Mucosal edema in the paranasal sinuses.  Calcification in the cavernous carotid is mild.  No skull lesion.  Right frontal scalp lipoma over the convexities unchanged from the prior study and appears benign.  IMPRESSION: No acute intracranial abnormality.  If the patient continues to have symptoms of anoxic injury, follow-up CT or MRI is suggested.   Original Report Authenticated By: Carl Best, M.D.    Dg Chest Port 1 View  01/02/2013  *RADIOLOGY REPORT*  Clinical Data: Shortness of  breath.  PORTABLE CHEST - 1 VIEW  Comparison: 01/01/2013.  Findings: Endotracheal tube tip 7.2 cm above the carina. Nasogastric tube tip gastric fundus.  Overlying leads noted.  Heart appears slightly prominent.  Calcified tortuous aorta.  Slight improved aeration right mid to lower lung zone.  Perihilar prominence may represent pulmonary vascular congestion/mild pulmonary edema.  Infectious infiltrate is a secondary less likely consideration.  IMPRESSION: Slight improved aeration right mid to lower lung zone.  Please see above.   Original Report Authenticated By: Genia Del, M.D.      Assessment/Plan: 73 YO male with multiple comorbidity who presented to ED after cardiac arrest and CPR for 30 minutes. Arctic sun protocol was initiated and patient rewarmed at 1700 hours 01/01/13.  Exam shows patient breathing over the vent, intact pupillary, corneal reflex, disconjugate gaze and responds to noxious stimuli in bilateral UE to nailbed pressure. Prognosis is guarded as patient has been < 72 hours since rewarming.   1) EEG ordered 2) MRI brain w/o contrast to evaluate for  possible embolic shower.  Will continue to follow with you.   Etta Quill PA-C [facility] [phone]  I personally participated in this patient's evaluation and management as well as approved above clinical assessment and management recommendations.  Rush Farmer M.D. [facility] [phone]  01/02/2013, 1:35 PM

## 2013-01-02 NOTE — Progress Notes (Signed)
NUTRITION FOLLOW UP  Intervention:   1. Initiate Osmolite 1.2 @ 20 ml/hr via OG and increase by 10 ml every 4 hours to goal rate of 55 ml/hr. 30 ml Prostat TID.  At goal rate, tube feeding regimen will provide 1884 kcal, 118 grams of protein, and 1082 ml of H2O.    Nutrition Dx:   Inadequate oral intake related to inability to eat as evidenced by NPO diet status.    Goal:   Intake to meet >/=90% estimated nutrition needs   Monitor:   Vent status, weight trends, TF tolerance, labs  Assessment:   Pt remains intubated, PS trials but no extubation until MS improves. Pt rewarmed from hypothermia protocol. Requiring pressors. RD consulted for initiation and management of EN.   Height: Ht Readings from Last 1 Encounters:  12/31/12 6\' 2"  (1.88 m)    Weight Status:   Wt Readings from Last 1 Encounters:  12/31/12 184 lb 1.4 oz (83.5 kg)   Patient is currently intubated on ventilator support.  MV: 7.1 Temp:Temp (24hrs), Avg:98.5 F (36.9 C), Min:94.5 F (34.7 C), Max:99.9 F (37.7 C)  Propofol: none   Re-estimated needs:  Kcal: 1911 Protein: 110-125 gm  Fluid: >/= 1.9 L   Skin: intact   Diet Order: NPO   Intake/Output Summary (Last 24 hours) at 01/02/13 1147 Last data filed at 01/02/13 1000  Gross per 24 hour  Intake 1879.01 ml  Output   1975 ml  Net -95.99 ml    Last BM: 4/30    Labs:   Recent Labs Lab 12/31/12 0230  01/01/13 0505  01/01/13 1500 01/01/13 1900 01/02/13 0420  NA 135  < > 136  < > 138 140 141  K 3.9  < > 3.6  < > 4.4 5.1 5.2*  CL 105  < > 100  < > 100 102 102  CO2 15*  < > 23  < > 24 26 24   BUN 22  < > 35*  < > 36* 38* 42*  CREATININE 2.02*  < > 2.16*  < > 2.22* 2.37* 2.55*  CALCIUM 9.9  < > 8.4  < > 7.8* 7.7* 7.6*  MG 3.4*  --  2.0  --   --   --  1.7  PHOS 6.2*  --  1.3*  --   --   --  5.8*  GLUCOSE 240*  < > 155*  < > 86 92 107*  < > = values in this interval not displayed.  CBG (last 3)   Recent Labs  01/02/13 0016  01/02/13 0418 01/02/13 0800  GLUCAP 81 105* 108*    Scheduled Meds: . antiseptic oral rinse  15 mL Mouth Rinse QID  . aspirin  81 mg Per NG tube Daily  . chlorhexidine  15 mL Mouth Rinse BID  . Chlorhexidine Gluconate Cloth  6 each Topical Q0600  . clopidogrel  75 mg Per NG tube Q breakfast  . furosemide  40 mg Intravenous Q8H  . insulin aspart  2-6 Units Subcutaneous Q4H  . insulin glargine  20 Units Subcutaneous Q24H  . mupirocin ointment  1 application Nasal BID  . pantoprazole (PROTONIX) IV  40 mg Intravenous Q24H    Continuous Infusions: . sodium chloride 10 mL/hr at 01/02/13 0400  . sodium chloride 20 mL/hr at 01/01/13 1900  . DOPamine 3 mcg/kg/min (01/01/13 1720)  . norepinephrine (LEVOPHED) Adult infusion 28 mcg/min (01/02/13 0700)    Orson Slick RD, LDN Pager [phone] After Hours  pager [phone]

## 2013-01-02 NOTE — Progress Notes (Signed)
Wasted 169ml of fentanyl and 25 ml of versed with Kerrie Pleasure, RN. Casimer Leek, RN

## 2013-01-02 NOTE — Progress Notes (Signed)
Inpatient Diabetes Program Recommendations  AACE/ADA: New Consensus Statement on Inpatient Glycemic Control (2013)  Target Ranges:  Prepandial:   less than 140 mg/dL      Peak postprandial:   less than 180 mg/dL (1-2 hours)      Critically ill patients:  140 - 180 mg/dL   Inpatient Diabetes Program Recommendations Insulin - Basal: consider decreasing Lantus to 15 units  Thank you  Raoul Pitch BSN, RN,CDE Inpatient Diabetes Coordinator [phone] (team pager)

## 2013-01-02 NOTE — Progress Notes (Signed)
Pt having acute hypotension despite pressors being titrated up.  Pt rebounded BP 1 minute after titration.Called Dr. Conception Chancy for further advise. No new orders at this time. Will continue to monitor closely.

## 2013-01-02 NOTE — Progress Notes (Signed)
Patient Name: Logan White      SUBJECTIVE: continues with pressor support requiring overnight up titration VF peri MI>> cooled.  ECHO  EF55%>>30-35% with rv dysfunction Now warmed and febrile  Past Medical History  Diagnosis Date  . Inguinal hernia without mention of obstruction or gangrene, unilateral or unspecified, (not specified as recurrent)   . 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 Peripheral vascular disease, unspecified   . Carotid stenosis     dopplers 1/11: 60-79% bilat.  . Hyperlipidemia     mixed  . Hypertension     a. echo 4/12: EF 50%, asymmetric septal hypertrophy, no SAM or LVOT gradient, LAE, PASP 35  . Coronary artery disease     a. s/p aborted ant STEMI tx with Cypher DES to LAD 10/04 (residual at cath: D1 50%, CFX 40% and multiple dist 70%, EF 55%);   b. myoview 3/10: Ef 47%, infero-apical isch, LOW RISK - med Tx recommended  . CKD (chronic kidney disease)   . Paroxysmal atrial fibrillation 12/2010  . Diverticulosis     2001  . Diabetes mellitus   . H/O: CVA (cardiovascular accident)   . Coronary artery disease     a. s/p aborted ant STEMI s/p DES to LAD 06/2003.  Marland Kitchen Hypertension   . Glucose intolerance (impaired glucose tolerance)   . Pneumonia   . Carotid artery stenosis     01/2011 - 40-59% bilateral stenosis  . Peripheral vascular disease     h/o LE angioplasty  . 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 Cardiomyopathy     a. EF 50-55% by echo 03/09/12 (was 30% by echo 02/24/12)  . Chronic renal insufficiency   . Paroxysmal atrial fibrillation   . Mitral regurgitation     Mild by echo 03/2012  . Hematemesis     12/2010 felt 2/2 Mallory Weiss tear - pt could not afford colonscopy/EGD at that time so Coumadin was deferred. Coumadin initiated 03/2012  without any evidence for bleeding.  Marland Kitchen Hyperlipidemia   . Inguinal hernia   . Diverticulosis 2001  . QT prolongation   . Bradycardia     Requiring discontinuation of use of BB/CCB  . Elevated LFTs     Shock liver 03/2012    PHYSICAL EXAM Filed Vitals:   01/02/13 0724 01/02/13 0725 01/02/13 0730 01/02/13 0753  BP:    181/65  Pulse:    76  Temp: 99.5 F (37.5 C) 99.5 F (37.5 C) 99.5 F (37.5 C) 99.5 F (37.5 C)  TempSrc:      Resp: 24 24 24 24   Height:      Weight:      SpO2: 98% 99% 100% 100%    Intubated and responds to voice intubated HENT normal Neck supple Clear Regular rate and rhythm, no murmurs or gallops Abd-soft with active BS No Clubbing cyanosis edema Skin-warm and dry Semi purpseful movement     Intake/Output Summary (Last 24 hours) at 01/02/13 0802 Last data filed at 01/02/13 0700  Gross per 24 hour  Intake 2064.42 ml  Output   2225 ml  Net -160.58 ml    LABS: Basic Metabolic Panel:  Recent Labs Lab 01/01/13 01/01/13 0505 01/01/13 0759 01/01/13 1200 01/01/13 1500 01/01/13 1900 01/02/13 0420  NA 139 136 137 138 138 140 141  K 2.9* 3.6 3.6 3.6  4.4 5.1 5.2*  CL 103 100 101 101 100 102 102  CO2 21 23 25 25 24 26 24   GLUCOSE 155* 155* 107* 72 86 92 107*  BUN 33* 35* 35* 35* 36* 38* 42*  CREATININE 2.20* 2.16* 2.20* 2.16* 2.22* 2.37* 2.55*  CALCIUM 8.6 8.4 8.3* 8.1* 7.8* 7.7* 7.6*  MG  --  2.0  --   --   --   --  1.7  PHOS  --  1.3*  --   --   --   --  5.8*   Cardiac Enzymes:  Recent Labs  12/31/12 0231 12/31/12 0700 12/31/12 1431  TROPONINI 2.78* 6.90* 19.30*   CBC:  Recent Labs Lab 12/30/12 2312 12/30/12 2336 12/31/12 0230 12/31/12 1008 01/01/13 0505 01/02/13 0420  WBC 8.9  --  14.2*  --  6.3 12.5*  NEUTROABS 2.4  --   --   --   --   --   HGB 14.5 15.0 16.7 18.4* 17.6* 16.4  HCT 39.9 44.0 43.4 54.0* 44.2 42.5  MCV 77.5*  --  74.1*  --  72.0* 73.3*  PLT 101*  --  207  --  165 147*   PROTIME:  Recent Labs   12/31/12 1700 12/31/12 2115 01/01/13 0500  LABPROT 34.0* 34.9* 28.5*  INR 3.62* 3.75* 2.86*   Liver Function Tests:  Recent Labs  12/31/12 0230 01/01/13 0759  AST 282* 264*  ALT 154* 125*  ALKPHOS 195* 149*  BILITOT 0.4 0.8  PROT 6.3 5.3*  ALBUMIN 2.9* 2.2*   No results found for this basename: LIPASE, AMYLASE,  in the last 72 hours BNP: BNP (last 3 results)  Recent Labs  03/06/12 0430 03/11/12 0858 04/23/12 0939  PROBNP 25835.0* 39057.0* 1756.0*   D-Dimer: No results found for this basename: DDIMER,  in the last 72 hours Hemoglobin A1C:  Recent Labs  12/31/12 0230  HGBA1C 6.0*   Fasting Lipid Panel:      ASSESSMENT AND PLAN:  Principal Problem:   Cardiac arrest due to underlying cardiac condition Active Problems:   HYPERLIPIDEMIA-MIXED   HYPERTENSION   PERIPHERAL VASCULAR DISEASE   Acute MI, anterolateral wall, initial episode of care   VF (ventricular fibrillation)   Abnormal LFTs   Acute on chronic renal insufficiency   Chronic anticoagulation   Hypokalemia  Will check troponin as we never saw it peak On ASA and plavix, no other options right now with INR and pressor requirement Prognosis grim Hyperkalemia and worsening renal function evident Should we give tylenol for fever  Signed, Virl Axe MD  01/02/2013

## 2013-01-02 NOTE — Progress Notes (Signed)
PULMONARY  / CRITICAL CARE MEDICINE  Name: Logan White MRN: [id number] DOB: [birth year]    ADMISSION DATE:  12/30/2012  REFERRING MD :  Dr. Tomi Bamberger PRIMARY SERVICE: PCCM  CHIEF COMPLAINT:  Cardiac arrest  BRIEF PATIENT DESCRIPTION:  Logan White is a 72 year old man with PMH of ischemic cardiomyopathy, A.fib on warfarin, CVA, DM2, CKD, HTN, and previous cardiac arrest who presented to the [facility] via EMS with VF/VT arrest with possible STEMI, gained pulse, started on Arctic Sun protocol to cardiac cath.   SIGNIFICANT EVENTS / STUDIES:  4/27 VT/VT Cardiac arrest with ~30 minutes of CPR  4/27 Initiation of Arctic Sun protocol 4/27 CXR cardiomegaly with mild pulmonary edema 4/28 Cardiac cath- no culprit new lesions.  LINES / TUBES: Left femoral CL 4/27>>> ETT 4/27>>> OGT 4/27>> Foley 4/27>>>   CULTURES: NA  ANTIBIOTICS: NA  SUBJECTIVE:  Cardiac arrest, pulse after 66min of CPR, multiple doses of epi and shock Arctic Sun protocol Cardiac Cath 4/28 early am. On norepinephrine and dopamine drip  VITAL SIGNS: Temp:  [93.2 F (34 C)-99.9 F (37.7 C)] 99.5 F (37.5 C) (04/30 0753) Pulse Rate:  [65-94] 76 (04/30 0753) Resp:  [24-25] 24 (04/30 0753) BP: (78-181)/(49-75) 181/65 mmHg (04/30 0753) SpO2:  [98 %-100 %] 100 % (04/30 0753) FiO2 (%):  [40 %-60 %] 40 % (04/30 0753) HEMODYNAMICS:   VENTILATOR SETTINGS: Vent Mode:  [-] PRVC FiO2 (%):  [40 %-60 %] 40 % Set Rate:  [24 bmp] 24 bmp Vt Set:  [550 mL] 550 mL PEEP:  [10 cmH20] 10 cmH20 Plateau Pressure:  [23 cmH20-27 cmH20] 25 cmH20 INTAKE / OUTPUT: Intake/Output     04/29 0701 - 04/30 0700 04/30 0701 - 05/01 0700   I.V. (mL/kg) 1684 (20.2)    Other 30    NG/GT 30    IV Piggyback 594    Total Intake(mL/kg) 2338 (28)    Urine (mL/kg/hr) 1375 (0.7)    Emesis/NG output 700 (0.3)    Stool 400 (0.2)    Total Output 2475     Net -137           PHYSICAL EXAMINATION: General:  Sedated and intubated Neuro:   Sedated. HEENT: ETT in place. Cardiovascular:  S1, s2, irregularly irregular rhythm. Lungs:  Bibasilar crackles, mechanical breath sounds. Abdomen:  Soft, no distended. Musculoskeletal:  Good tone. Skin:  Intact.  LABS:  Recent Labs Lab 12/30/12 2312  12/31/12 0230 12/31/12 0231  12/31/12 0700  12/31/12 1008 12/31/12 1210 12/31/12 1431  12/31/12 1700  12/31/12 2115  01/01/13 0345 01/01/13 0500 01/01/13 0505 01/01/13 0548 01/01/13 0759  01/01/13 1459 01/01/13 1500 01/01/13 1900 01/02/13 0245 01/02/13 0420 01/02/13 0422  HGB 14.5  < > 16.7  --   --   --   --  18.4*  --   --   --   --   --   --   --   --   --  17.6*  --   --   --   --   --   --   --  16.4  --   WBC 8.9  --  14.2*  --   --   --   --   --   --   --   --   --   --   --   --   --   --  6.3  --   --   --   --   --   --   --  12.5*  --   PLT 101*  --  207  --   --   --   --   --   --   --   --   --   --   --   --   --   --  165  --   --   --   --   --   --   --  147*  --   NA 142  < > 135  --   < > 138  --  140 136  --   --  138  --  139  < >  --   --  136  --  137  < >  --  138 140  --  141  --   K 3.9  < > 3.9  --   < > 2.7*  --  2.9* 2.8*  --   --  2.9*  --  3.4*  < >  --   --  3.6  --  3.6  < >  --  4.4 5.1  --  5.2*  --   CL 102  < > 105  --   < > 103  --  111 105  --   --  106  --  105  < >  --   --  100  --  101  < >  --  100 102  --  102  --   CO2 18*  --  15*  --   < > 16*  --   --  16*  --   --  19  --  20  < >  --   --  23  --  25  < >  --  24 26  --  24  --   GLUCOSE 307*  < > 240*  --   < > 295*  --  265* 217*  --   --  135*  --  139*  < >  --   --  155*  --  107*  < >  --  86 92  --  107*  --   BUN 16  < > 22  --   < > 25*  --  27* 28*  --   --  32*  --  32*  < >  --   --  35*  --  35*  < >  --  36* 38*  --  42*  --   CREATININE 1.78*  < > 2.02*  --   < > 2.16*  --  2.30* 2.17*  --   --  2.25*  --  2.18*  < >  --   --  2.16*  --  2.20*  < >  --  2.22* 2.37*  --  2.55*  --   CALCIUM 13.4*  --  9.9  --    < > 9.8  --   --  9.5  --   --  9.2  --  8.8  < >  --   --  8.4  --  8.3*  < >  --  7.8* 7.7*  --  7.6*  --   MG  --   --  3.4*  --   --   --   --   --   --   --   --   --   --   --   --   --   --  2.0  --   --   --   --   --   --   --  1.7  --   PHOS  --   --  6.2*  --   --   --   --   --   --   --   --   --   --   --   --   --   --  1.3*  --   --   --   --   --   --   --  5.8*  --   AST 135*  --  282*  --   --   --   --   --   --   --   --   --   --   --   --   --   --   --   --  264*  --   --   --   --   --   --   --   ALT 109*  --  154*  --   --   --   --   --   --   --   --   --   --   --   --   --   --   --   --  125*  --   --   --   --   --   --   --   ALKPHOS 135*  --  195*  --   --   --   --   --   --   --   --   --   --   --   --   --   --   --   --  149*  --   --   --   --   --   --   --   BILITOT 0.2*  --  0.4  --   --   --   --   --   --   --   --   --   --   --   --   --   --   --   --  0.8  --   --   --   --   --   --   --   PROT 5.2*  --  6.3  --   --   --   --   --   --   --   --   --   --   --   --   --   --   --   --  5.3*  --   --   --   --   --   --   --   ALBUMIN 2.3*  --  2.9*  --   --   --   --   --   --   --   --   --   --   --   --   --   --   --   --  2.2*  --   --   --   --   --   --   --   APTT 54*  --  163*  --   --   --   --   --  75*  --   --   --   --   --   --   --   --   --   --   --   --   --   --   --   --   --   --  INR 2.72*  --  9.05*  --   --   --   --   --  3.64*  --   --  3.62*  --  3.75*  --   --  2.86*  --   --   --   --   --   --   --   --   --   --   LATICACIDVEN  --   --   --   --   --   --   < >  --   --   --   < >  --   < >  --   --   --   --   --   --  3.2*  --  3.0*  --  2.9* 3.2*  --   --   TROPONINI 0.35*  --   --  2.78*  --  6.90*  --   --   --  19.30*  --   --   --   --   --   --   --   --   --   --   --   --   --   --   --   --   --   PHART  --   < >  --   --   --   --   < >  --   --   --   --   --   --   --   --  7.553*  --   --  7.589*  --   --    --   --   --   --   --  7.487*  PCO2ART  --   < >  --   --   --   --   < >  --   --   --   --   --   --   --   --  25.2*  --   --  25.7*  --   --   --   --   --   --   --  30.2*  PO2ART  --   < >  --   --   --   --   < >  --   --   --   --   --   --   --   --  237.0*  --   --  245.0*  --   --   --   --   --   --   --  119.0*  < > = values in this interval not displayed.  Recent Labs Lab 01/01/13 1516 01/01/13 1941 01/02/13 0016 01/02/13 0418 01/02/13 0800  GLUCAP 87 84 81 105* 108*   CXR: 4/27 Cardiomegaly and mild pulmonary edema CXr: 4/28 No new infiltrates. CXR: 4/29- increasing right mid and lower lung air space disease.  ASSESSMENT / PLAN:  PULMONARY A: VDRF - 2/2 cardiac arrest P:   - Begin PS trials.. - No extubation until patient is more stable. - Portable chest x-ray in a.m.  CARDIOVASCULAR A: VF/VT arrest-  STEMI- status post PCI and stenting of CFx, troponins 6.9<2.78<0.35 Arctic Sun protocol P:  - Per cardiology. - Aspirin and Plavix. No statin secondary to elevated LFTs and no beta blocker or Ace inhibitors secondary to hypertension and AKI. - Artic sun protocol complete. - Levophed for BP support.  RENAL A:  Acute on chronic kidney disease. Baseline CKD stage 3 - Baseline Cr 1.5, 1.7 on admission Creatinine 2.55 <2.2<2.16<2.02<1.78-likely secondary to PCI. Hypokalemia- 3.6<2.7<3.0<3.9 Hyperkalemia 5.2<5.1 P:   - Trend K, Mg, Phos. - Lasix as ordered.  GASTROINTESTINAL A:  No active issues P:   - Protonix 40mg  IV daily for SUP. - TF as ordered.  HEMATOLOGIC A:   Leukocytosis- resolved. WBC 12< 6.3 <14.2< 8.9. Likely stress-related. P:  - CBC in AM.  INFECTIOUS A:  No active issues P:   - Monitor fever curve and WBC once warmed.  ENDOCRINE A:  Hyperglycemia - undiagnosed DM2? P:   - CBG q 4h. - SSI.  NEUROLOGIC A:  Risk for brain anoxia 2/2 cardiac arrest P:   - Continue Arctic Sun protocol - completed yesterday.  - Fentanyl and  Versed for sedation while on vent - EEG and CT ordered to evaluate neuro status. - Neuro consult.  PATEL,RAVI, MD 9:07 AM  TODAY'S SUMMARY: Cardiac arrest 2/2 STEMI. CPR for ~30 minutes with return of pulse. Intubated. [employer] protocol initiated, cath lab- PCI to circumflex- flow not restored. LAD 80% apical lesion.   Hypothermia protocolxcompleted yesterday. No respiratory effort from patient.    I have personally obtained a history, examined the patient, evaluated laboratory and imaging results, formulated the assessment and plan and placed orders.  CRITICAL CARE: The patient is critically ill with multiple organ systems failure and requires high complexity decision making for assessment and support, frequent evaluation and titration of therapies, application of advanced monitoring technologies and extensive interpretation of multiple databases. Critical Care Time devoted to patient care services described in this note is 35 minutes.   Rush Farmer, M.D. Pulmonary and [place] Pager: [phone]  01/02/2013, 9:07 AM

## 2013-01-02 NOTE — Progress Notes (Signed)
RT attempted wean, NO Pt effort at all. Pt does not breath over vent on full support and was did not breath on Pressure Support as well.

## 2013-01-02 NOTE — Progress Notes (Signed)
Levophed titrated up due to drop of MAP to 50s.  No change in heart rate/rhythm observed.

## 2013-01-03 ENCOUNTER — Inpatient Hospital Stay (HOSPITAL_COMMUNITY): Payer: Medicare Other

## 2013-01-03 DIAGNOSIS — I469 Cardiac arrest, cause unspecified: Secondary | ICD-10-CM

## 2013-01-03 DIAGNOSIS — I2109 ST elevation (STEMI) myocardial infarction involving other coronary artery of anterior wall: Secondary | ICD-10-CM

## 2013-01-03 LAB — GLUCOSE, CAPILLARY
Glucose-Capillary: 121 mg/dL — ABNORMAL HIGH (ref 70–99)
Glucose-Capillary: 121 mg/dL — ABNORMAL HIGH (ref 70–99)
Glucose-Capillary: 131 mg/dL — ABNORMAL HIGH (ref 70–99)

## 2013-01-03 LAB — URINALYSIS, ROUTINE W REFLEX MICROSCOPIC
Bilirubin Urine: NEGATIVE
Ketones, ur: NEGATIVE mg/dL
Nitrite: NEGATIVE
Protein, ur: 30 mg/dL — AB
Specific Gravity, Urine: 1.012 (ref 1.005–1.030)
Urobilinogen, UA: 1 mg/dL (ref 0.0–1.0)

## 2013-01-03 LAB — BLOOD GAS, ARTERIAL
Drawn by: 36274
MECHVT: 550 mL
PEEP: 5 cmH2O
RATE: 12 resp/min
pCO2 arterial: 51.9 mmHg — ABNORMAL HIGH (ref 35.0–45.0)
pO2, Arterial: 51.1 mmHg — ABNORMAL LOW (ref 80.0–100.0)

## 2013-01-03 LAB — CBC
HCT: 41.7 % (ref 39.0–52.0)
Hemoglobin: 15.5 g/dL (ref 13.0–17.0)
MCH: 28.2 pg (ref 26.0–34.0)
MCHC: 37.2 g/dL — ABNORMAL HIGH (ref 30.0–36.0)
MCV: 75.8 fL — ABNORMAL LOW (ref 78.0–100.0)
RDW: 14.2 % (ref 11.5–15.5)

## 2013-01-03 LAB — URINE MICROSCOPIC-ADD ON

## 2013-01-03 LAB — BASIC METABOLIC PANEL
BUN: 52 mg/dL — ABNORMAL HIGH (ref 6–23)
Calcium: 7.4 mg/dL — ABNORMAL LOW (ref 8.4–10.5)
Creatinine, Ser: 3.01 mg/dL — ABNORMAL HIGH (ref 0.50–1.35)
GFR calc Af Amer: 23 mL/min — ABNORMAL LOW (ref >90)
GFR calc non Af Amer: 20 mL/min — ABNORMAL LOW (ref >90)
Glucose, Bld: 131 mg/dL — ABNORMAL HIGH (ref 70–99)
Potassium: 4.7 mEq/L (ref 3.5–5.1)

## 2013-01-03 LAB — LACTIC ACID, PLASMA
Lactic Acid, Venous: 3 mmol/L — ABNORMAL HIGH (ref 0.5–2.2)
Lactic Acid, Venous: 2.4 mmol/L — ABNORMAL HIGH (ref 0.5–2.2)

## 2013-01-03 MED ORDER — MAGNESIUM SULFATE 40 MG/ML IJ SOLN
2.0000 g | Freq: Once | INTRAMUSCULAR | Status: AC
  Administered 2013-01-03: 2 g via INTRAVENOUS
  Filled 2013-01-03: qty 50

## 2013-01-03 MED ORDER — SODIUM CHLORIDE 0.9 % IV BOLUS (SEPSIS)
500.0000 mL | Freq: Once | INTRAVENOUS | Status: AC
  Administered 2013-01-03: 500 mL via INTRAVENOUS

## 2013-01-03 MED ORDER — AMIODARONE HCL IN DEXTROSE 360-4.14 MG/200ML-% IV SOLN
INTRAVENOUS | Status: AC
  Filled 2013-01-03: qty 200

## 2013-01-03 MED ORDER — AMIODARONE HCL IN DEXTROSE 360-4.14 MG/200ML-% IV SOLN
60.0000 mg/h | INTRAVENOUS | Status: AC
  Filled 2013-01-03: qty 200

## 2013-01-03 MED ORDER — PANTOPRAZOLE SODIUM 40 MG PO PACK
40.0000 mg | PACK | Freq: Every day | ORAL | Status: DC
  Administered 2013-01-03 – 2013-01-04 (×2): 40 mg
  Filled 2013-01-03 (×3): qty 20

## 2013-01-03 MED ORDER — AMIODARONE HCL IN DEXTROSE 360-4.14 MG/200ML-% IV SOLN
30.0000 mg/h | INTRAVENOUS | Status: DC
  Administered 2013-01-03 – 2013-01-07 (×9): 30 mg/h via INTRAVENOUS
  Filled 2013-01-03 (×21): qty 200

## 2013-01-03 MED ORDER — FUROSEMIDE 10 MG/ML IJ SOLN
40.0000 mg | Freq: Three times a day (TID) | INTRAMUSCULAR | Status: AC
  Administered 2013-01-03 (×2): 40 mg via INTRAVENOUS
  Filled 2013-01-03 (×2): qty 4

## 2013-01-03 MED ORDER — FENTANYL CITRATE 0.05 MG/ML IJ SOLN
INTRAMUSCULAR | Status: AC
  Filled 2013-01-03: qty 2

## 2013-01-03 MED ORDER — AMIODARONE LOAD VIA INFUSION
150.0000 mg | Freq: Once | INTRAVENOUS | Status: AC
  Administered 2013-01-03: 150 mg via INTRAVENOUS

## 2013-01-03 MED ORDER — FENTANYL CITRATE 0.05 MG/ML IJ SOLN
50.0000 ug | Freq: Once | INTRAMUSCULAR | Status: AC
  Administered 2013-01-03: 50 ug via INTRAVENOUS

## 2013-01-03 NOTE — Significant Event (Signed)
Pt noted to develop tachycardia after getting a bath.  He is on dopamine and levophed.  Will give fentanyl 50 mcg IV x one.  Advised to wean off dopamine as tolerated as BP currently 08/06/59 with MAP 78.  Chesley Mires, MD [facility] Pulmonary/Critical Care 01/03/2013, 3:38 AM Pager:  [phone] After 3pm call: [phone]

## 2013-01-03 NOTE — Progress Notes (Addendum)
NEURO HOSPITALIST PROGRESS NOTE   SUBJECTIVE:                                                                                                                        Patient remains intubated and unable to give subjective history  OBJECTIVE:                                                                                                                           Vital signs in last 24 hours: Temp:  [99 F (37.2 C)-99.7 F (37.6 C)] 99.3 F (37.4 C) (05/01 0801) Pulse Rate:  [76-78] 76 (04/30 1524) Resp:  [12-24] 17 (05/01 0801) BP: (134-137)/(56-70) 137/70 mmHg (04/30 2000) SpO2:  [96 %-100 %] 98 % (05/01 0801) FiO2 (%):  [40 %-50 %] 40 % (05/01 0801) Weight:  [86.4 kg (190 lb 7.6 oz)] 86.4 kg (190 lb 7.6 oz) (05/01 0500)  Intake/Output from previous day: 04/30 0701 - 05/01 0700 In: 1849.9 [I.V.:1474.9; NG/GT:345] Out: 3805 [Urine:3675; Stool:130] Intake/Output this shift:   Nutritional status: NPO  Past Medical History  Diagnosis Date  . Inguinal hernia without mention of obstruction or gangrene, unilateral or unspecified, (not specified as recurrent)   . 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 Peripheral vascular disease, unspecified   . Carotid stenosis     dopplers 1/11: 60-79% bilat.  . Hyperlipidemia     mixed  . Hypertension     a. echo 4/12: EF 50%, asymmetric septal hypertrophy, no SAM or LVOT gradient, LAE, PASP 35  . Coronary artery disease     a. s/p aborted ant STEMI tx with Cypher DES to LAD 10/04 (residual at cath: D1 50%, CFX 40% and multiple dist 70%, EF 55%);   b. myoview 3/10: Ef 47%, infero-apical isch, LOW RISK - med Tx recommended  . CKD (chronic kidney disease)   . Paroxysmal atrial fibrillation 12/2010  . Diverticulosis     2001  . Diabetes mellitus   . H/O: CVA (cardiovascular accident)   . Coronary artery disease     a. s/p aborted ant STEMI s/p DES to LAD 06/2003.  Marland Kitchen  Hypertension   . Glucose intolerance (impaired glucose tolerance)   . Pneumonia   .  Carotid artery stenosis     01/2011 - 40-59% bilateral stenosis  . Peripheral vascular disease     h/o LE angioplasty  . 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 Cardiomyopathy     a. EF 50-55% by echo 03/09/12 (was 30% by echo 02/24/12)  . Chronic renal insufficiency   . Paroxysmal atrial fibrillation   . Mitral regurgitation     Mild by echo 03/2012  . Hematemesis     12/2010 felt 2/2 Mallory Weiss tear - pt could not afford colonscopy/EGD at that time so Coumadin was deferred. Coumadin initiated 03/2012 without any evidence for bleeding.  Marland Kitchen Hyperlipidemia   . Inguinal hernia   . Diverticulosis 2001  . QT prolongation   . Bradycardia     Requiring discontinuation of use of BB/CCB  . Elevated LFTs     Shock liver 03/2012     Neurologic Exam:  Mental Status:  Patient does not respond to verbal stimuli. Opens eyes temporarily in responce to deep sternal rub. Does not follow commands. No verbalizations but intubated and breathing over vent  Cranial Nerves:  II: patient does not respond confrontation bilaterally, pupils right 1 mm, left 1 mm,and sluggishly reactive bilaterally  III,IV,VI: doll's response present bilaterally. Eyes disconjugate  V,VII: corneal reflex present bilaterally  VIII: patient does not respond to verbal stimuli  IX,X: gag reflex present , XI: trapezius strength unable to test bilaterally  XII: tongue strength unable to test  Motor:  Extremities flaccid throughout. Does respond to noxious stimuli in bilateral arms and and legs  Sensory:  As above  Deep Tendon Reflexes:  2+ in UE at bicep and brachioradialis no KJ and no AJ  Plantars:  absent bilaterally  Cerebellar:  Unable to perform   Lab Results: Lab  Results  Component Value Date/Time   CHOL 135 08/23/2012  9:02 AM   Lipid Panel No results found for this basename: CHOL, TRIG, HDL, CHOLHDL, VLDL, LDLCALC,  in the last 72 hours  Studies/Results: Ct Head Wo Contrast  01/02/2013  *RADIOLOGY REPORT*  Clinical Data: Cardiac arrest, rule out anoxic brain injury.  On Coumadin  CT HEAD WITHOUT CONTRAST  Technique:  Contiguous axial images were obtained from the base of the skull through the vertex without contrast.  Comparison: CT head 01/07/2007  Findings: Ventricle size is normal.  Negative for acute infarct. Mild chronic microvascular ischemic changes in the white matter. No evidence of cortical edema.  Pearline Cables and white matter differentiation is maintained.  Negative for hemorrhage or mass.  No acute infarct or edema.  No shift to the midline structures.  Mucosal edema in the paranasal sinuses.  Calcification in the cavernous carotid is mild.  No skull lesion.  Right frontal scalp lipoma over the convexities unchanged from the prior study and appears benign.  IMPRESSION: No acute intracranial abnormality.  If the patient continues to have symptoms of anoxic injury, follow-up CT or MRI is suggested.   Original Report Authenticated By: Carl Best, M.D.    Dg Chest Port 1 View  01/03/2013  *RADIOLOGY REPORT*  Clinical Data: Evaluate endotracheal tube positioning  PORTABLE CHEST - 1 VIEW  Comparison: 01/02/2013; 12/30/2012; 11/24/2012  Findings: Grossly unchanged cardiac silhouette and mediastinal contours with atherosclerotic calcification of the thoracic aorta. Stable position of support apparatus including transcutaneous pacer's devices.  Pulmonary vasculature is slightly less  distinct on the present examination.  Worsening perihilar medial basilar opacities, right greater than left.  No definite pleural effusion or pneumothorax.  Unchanged bones.  IMPRESSION: 1.  Stable positioning of support apparatus.  No pneumothorax. 2.  Suspected worsening pulmonary  edema and perihilar/bibasilar atelectasis.   Original Report Authenticated By: Jake Seats, MD    Dg Chest Port 1 View  01/02/2013  *RADIOLOGY REPORT*  Clinical Data: Shortness of breath.  PORTABLE CHEST - 1 VIEW  Comparison: 01/01/2013.  Findings: Endotracheal tube tip 7.2 cm above the carina. Nasogastric tube tip gastric fundus.  Overlying leads noted.  Heart appears slightly prominent.  Calcified tortuous aorta.  Slight improved aeration right mid to lower lung zone.  Perihilar prominence may represent pulmonary vascular congestion/mild pulmonary edema.  Infectious infiltrate is a secondary less likely consideration.  IMPRESSION: Slight improved aeration right mid to lower lung zone.  Please see above.   Original Report Authenticated By: Genia Del, M.D.    EEG INTERPRETATION: This EEG shows findings consistent with severe  encephalopathic process as seen with severe hypoxic encephalopathy. No  evidence of epileptiform activity was demonstrated  MEDICATIONS                                                                                                                        Scheduled: . amiodarone (NEXTERONE PREMIX) 360 mg/200 mL dextrose      . antiseptic oral rinse  15 mL Mouth Rinse QID  . aspirin  81 mg Per NG tube Daily  . chlorhexidine  15 mL Mouth Rinse BID  . Chlorhexidine Gluconate Cloth  6 each Topical Q0600  . clopidogrel  75 mg Per NG tube Q breakfast  . feeding supplement  30 mL Per Tube TID  . insulin aspart  2-6 Units Subcutaneous Q4H  . insulin glargine  20 Units Subcutaneous Q24H  . mupirocin ointment  1 application Nasal BID  . pantoprazole (PROTONIX) IV  40 mg Intravenous Q24H    ASSESSMENT/PLAN:                                                                                                            72 YO male with multiple comorbidity who presented to ED after cardiac arrest and CPR for 30 minutes. Arctic sun protocol was initiated and patient rewarmed at 1700  hours 01/01/13. Exam shows patient breathing over the vent, intact pupillary, corneal reflex, disconjugate gaze and responds to noxious stimuli in in both UE and LE to nailbed pressure. EEG was obtained and  did not show epileptiform activity. Patient continues to show brainstem function and prognosis is still guarded as patient has been < 36 hours since rewarming.    Plan: 1) MRI brain w/o contrast to evaluate for possible embolic shower. 2) Continue to treat underlying metabolic abnormalities such as elevated creatinine.    Will continue to follow with you     Assessment and plan discussed with with attending physician and they are in agreement.    Etta Quill PA-C [facility] [phone]  01/03/2013, 8:40 AM

## 2013-01-03 NOTE — Progress Notes (Signed)
Patient: Logan White Date of Encounter: 01/03/2013, 6:55 AM Admit date: 12/30/2012     Subjective  Mr. Bacak is s/p VF arrest, peri-MI >> cooled. Echo EF55%>>30-35% with RV dysfunction. Now warmed and febrile. Opens eyes to name but makes no other purposeful movements.   Objective  Physical Exam: Vitals: BP 137/70  Pulse 76  Temp(Src) 99 F (37.2 C) (Core (Comment))  Resp 18  Ht 6\' 2"  (1.88 m)  Wt 190 lb 7.6 oz (86.4 kg)  BMI 24.45 kg/m2  SpO2 99% General: Well developed, ill appearing 72 year old male intubated. Neck: Supple. Difficult to assess given support apparatus but I do not appreciate any JVD elevation. Lungs: Mechanical ventilation. Coarse breath sounds at bases bilaterally to auscultation.  Heart: Distant heart tones. Irregularly irregular S1 S2 without murmur, rub or gallop.  Abdomen: Soft, non-distended. Extremities: BLE cool to touch with diminished pedal pulses. No clubbing or cyanosis. No edema.   Neuro: Opens eyes to voice/name called otherwise no purposeful movement.  Intake/Output:  Intake/Output Summary (Last 24 hours) at 01/03/13 0655 Last data filed at 01/03/13 0600  Gross per 24 hour  Intake 1884.56 ml  Output   3805 ml  Net -1920.44 ml    Inpatient Medications:  . antiseptic oral rinse  15 mL Mouth Rinse QID  . aspirin  81 mg Per NG tube Daily  . chlorhexidine  15 mL Mouth Rinse BID  . Chlorhexidine Gluconate Cloth  6 each Topical Q0600  . clopidogrel  75 mg Per NG tube Q breakfast  . feeding supplement  30 mL Per Tube TID  . insulin aspart  2-6 Units Subcutaneous Q4H  . insulin glargine  20 Units Subcutaneous Q24H  . mupirocin ointment  1 application Nasal BID  . pantoprazole (PROTONIX) IV  40 mg Intravenous Q24H   . sodium chloride 10 mL/hr at 01/02/13 0400  . sodium chloride 20 mL/hr at 01/01/13 1900  . DOPamine 3 mcg/kg/min (01/02/13 2000)  . feeding supplement (OSMOLITE 1.2 CAL) 1,000 mL (01/02/13 1730)  . norepinephrine  (LEVOPHED) Adult infusion 32 mcg/min (01/03/13 0200)    Labs:  Recent Labs  01/02/13 0420 01/03/13 0432  NA 141 138  K 5.2* 4.7  CL 102 97  CO2 24 27  GLUCOSE 107* 131*  BUN 42* 52*  CREATININE 2.55* 3.01*  CALCIUM 7.6* 7.4*  MG 1.7 1.6  PHOS 5.8* 7.6*    Recent Labs  01/01/13 0759  AST 264*  ALT 125*  ALKPHOS 149*  BILITOT 0.8  PROT 5.3*  ALBUMIN 2.2*    Recent Labs  01/02/13 0420 01/03/13 0432  WBC 12.5* 7.9  HGB 16.4 15.5  HCT 42.5 41.7  MCV 73.3* 75.8*  PLT 147* 134*    Recent Labs  12/31/12 0700 12/31/12 1431  TROPONINI 6.90* 19.30*    Recent Labs  01/01/13 0500  INR 2.86*    Radiology/Studies: Ct Head Wo Contrast  01/02/2013  *RADIOLOGY REPORT*  Clinical Data: Cardiac arrest, rule out anoxic brain injury.  On Coumadin  CT HEAD WITHOUT CONTRAST  Technique:  Contiguous axial images were obtained from the base of the skull through the vertex without contrast.  Comparison: CT head 01/07/2007  Findings: Ventricle size is normal.  Negative for acute infarct. Mild chronic microvascular ischemic changes in the white matter. No evidence of cortical edema.  Pearline Cables and white matter differentiation is maintained.  Negative for hemorrhage or mass.  No acute infarct or edema.  No shift to  the midline structures.  Mucosal edema in the paranasal sinuses.  Calcification in the cavernous carotid is mild.  No skull lesion.  Right frontal scalp lipoma over the convexities unchanged from the prior study and appears benign.  IMPRESSION: No acute intracranial abnormality.  If the patient continues to have symptoms of anoxic injury, follow-up CT or MRI is suggested.   Original Report Authenticated By: Carl Best, M.D.    Dg Chest Port 1 View  01/02/2013  *RADIOLOGY REPORT*  Clinical Data: Shortness of breath.  PORTABLE CHEST - 1 VIEW  Comparison: 01/01/2013.  Findings: Endotracheal tube tip 7.2 cm above the carina. Nasogastric tube tip gastric fundus.  Overlying leads noted.   Heart appears slightly prominent.  Calcified tortuous aorta.  Slight improved aeration right mid to lower lung zone.  Perihilar prominence may represent pulmonary vascular congestion/mild pulmonary edema.  Infectious infiltrate is a secondary less likely consideration.  IMPRESSION: Slight improved aeration right mid to lower lung zone.  Please see above.   Original Report Authenticated By: Genia Del, M.D.     Telemetry: currently AF w/RVR; no further VT/VF    Assessment and Plan  1. VF arrest 2. STEMI 3. Ischemic CM 4. PAF 5. Acute on chronic renal failure 6. Anoxic brain injury  Dr. Caryl Comes to see Signed, EDMISTEN, BROOKE PA-C  Continue supportive care on high dose norepi ASA and plavix

## 2013-01-03 NOTE — Procedures (Signed)
EEG NUMBER:  [id number]  INDICATION FOR STUDY:  A 73 year old man with hypoxic encephalopathy following ventricular fibrillation arrest.  Study is being performed to assess severity of encephalopathy as well as to rule out possible subclinical seizure activity.  DESCRIPTION:  This is a routine EEG recording performed at the patient's bedside in intensive care unit.  The predominant background activity was one of burst suppression pattern with low amplitude 1 to 2 Hz delta activity, alternating with runs of faster rhythm, consisting of irregular 5-10 Hz, generalized low-to-moderate amplitude activity. Photic stimulation produced no appreciable occipital driving response. No epileptiform discharges were recorded.  There were no areas of focal disproportionate slowing.  INTERPRETATION:  This EEG shows findings consistent with severe encephalopathic process as seen with severe hypoxic encephalopathy.  No evidence of epileptiform activity was demonstrated.     Wallie Char, MD    [id number] D:  01/02/2013 16:58:11  T:  01/03/2013 07:40:46  Job #:  [id number]

## 2013-01-03 NOTE — Consult Note (Signed)
[facility]        RENAL CONSULT   Reason for Consult: Acute renal failure following out of hospital cardiac arrest and subsequent cardiac cath Referring Physician: Dr. Nelda Marseille  HPI: Logan White is a 72 y.o. black man admitted on 12/30/2012 after sustaining a cardiac arrest at home. He received 30 minutes CPR with restoration of pulse. He was brought to [facility] emergency room where he received hypothermia. He underwent cardiac catheterization on 28 April. 150 cc of contrast was used. A chronically occluded circumflex coronary artery was noted--unable to revascularize. He has had increasing serum creatinine over last several days and renal consult was requested by Dr. Nelda Marseille.  He receives primary care at [facility] according to his wife. He has had prior cardiac arrest last summer. His wife does not know of any renal disease in the past. He had acute renal failure following cardiac arrest in 2013. Creatinine in December 2013 was 1.5 .    Past Medical History  Diagnosis Date  . Inguinal hernia without mention of obstruction or gangrene, unilateral or unspecified, (not specified as recurrent)   . 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 Peripheral vascular disease, unspecified   . Carotid stenosis     dopplers 1/11: 60-79% bilat.  . Hyperlipidemia     mixed  . Hypertension     a. echo 4/12: EF 50%, asymmetric septal hypertrophy, no SAM or LVOT gradient, LAE, PASP 35  . Coronary artery disease     a. s/p aborted ant STEMI tx with Cypher DES to LAD 10/04 (residual at cath: D1 50%, CFX 40% and multiple dist 70%, EF 55%);   b. myoview 3/10: Ef 47%, infero-apical isch, LOW RISK - med Tx recommended  . CKD (chronic kidney disease)   . Paroxysmal atrial fibrillation 12/2010  . Diverticulosis     2001  . Diabetes mellitus   . H/O: CVA (cardiovascular accident)   . Coronary artery disease     a. s/p aborted ant  STEMI s/p DES to LAD 06/2003.  Marland Kitchen Hypertension   . Glucose intolerance (impaired glucose tolerance)   . Pneumonia   . Carotid artery stenosis     01/2011 - 40-59% bilateral stenosis  . Peripheral vascular disease     h/o LE angioplasty  . 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 Cardiomyopathy     a. EF 50-55% by echo 03/09/12 (was 30% by echo 02/24/12)  . Chronic renal insufficiency   . Paroxysmal atrial fibrillation   . Mitral regurgitation     Mild by echo 03/2012  . Hematemesis     12/2010 felt 2/2 Mallory Weiss tear - pt could not afford colonscopy/EGD at that time so Coumadin was deferred. Coumadin initiated 03/2012 without any evidence for bleeding.  Marland Kitchen Hyperlipidemia   . Inguinal hernia   . Diverticulosis 2001  . QT prolongation   . Bradycardia     Requiring discontinuation of use of BB/CCB  . Elevated LFTs     Shock liver 03/2012    Family History  Problem Relation Age of Onset  . Heart disease Father     ALSO UNCLE DIED HAD CAD  . Brain cancer Mother   . Leukemia Mother   . Sickle cell anemia Mother   . Heart failure Father   . Sickle  cell anemia Other   He has 2 brothers and 2 sisters who are healthy. He had 2 daughters-one daughter died of cancer. He has 2 sons who are healthy  Social History: Born in [place], he lived in [place] for many years. He moved back [place] about 30 years ago. 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 Prior to Admission:  Prescriptions prior to admission  Medication Sig Dispense Refill  . nitroGLYCERIN (NITROSTAT) 0.4 MG SL tablet  Place 1 tablet (0.4 mg total) under the tongue every 5 (five) minutes as needed for chest pain (Up to 3 doses).  25 tablet  4  . potassium chloride SA (K-DUR,KLOR-CON) 20 MEQ tablet one tablet daily      . spironolactone (ALDACTONE) 25 MG tablet TAKE ONE TABLET BY MOUTH DAILY  30 tablet  5  . warfarin (COUMADIN) 2.5 MG tablet Take 1 tablet (2.5 mg total) by mouth as directed.  35 tablet  2   Scheduled: . antiseptic oral rinse  15 mL Mouth Rinse QID  . aspirin  81 mg Per NG tube Daily  . chlorhexidine  15 mL Mouth Rinse BID  . Chlorhexidine Gluconate Cloth  6 each Topical Q0600  . clopidogrel  75 mg Per NG tube Q breakfast  . feeding supplement  30 mL Per Tube TID  . furosemide  40 mg Intravenous Q8H  . insulin aspart  2-6 Units Subcutaneous Q4H  . insulin glargine  20 Units Subcutaneous Q24H  . mupirocin ointment  1 application Nasal BID  . pantoprazole sodium  40 mg Per Tube QHS   Continuous: . sodium chloride 10 mL/hr at 01/02/13 0400  . sodium chloride 20 mL/hr at 01/01/13 1900  . amiodarone (NEXTERONE PREMIX) 360 mg/200 mL dextrose 60 mg/hr (01/03/13 0930)   And  . amiodarone (NEXTERONE PREMIX) 360 mg/200 mL dextrose    . DOPamine 3 mcg/kg/min (01/02/13 2000)  . feeding supplement (OSMOLITE 1.2 CAL) 1,000 mL (01/02/13 1730)  . norepinephrine (LEVOPHED) Adult infusion 28 mcg/min (01/03/13 0929)    Current meds-Aspirin 81/D., Plavix 75/D., furosemide 40 IV/D. Protonix 40/D IV, amiodarone IV, norepinephrine 20 mcg/minute  ROS- unable to obtain. Wife does not recall any positive symptoms  Physical Exam Blood pressure 137/70, pulse 76, temperature 99.3 F (37.4 C), temperature source Core (Comment), resp. rate 24, height 6\' 2"  (1.88 m), weight 86.4 kg (190 lb 7.6 oz), SpO2 99.00%. granddaughter and grandson Randall Hiss and Roslyn Smiling) are at bedside. He has spontaneous respirations  General-intubated, not sedated Chest-rhonchi Heart-no rub Abd-nontender, Foley catheter in  place Extr-no edema Neuro- poorly responsive  Intake and output-1475/1850 (yesterday),  Lab- hemoglobin 15.5, WBC 7900, PLTS 134K Sodium 138, potassium 4.7, chloride 97, CO2 27, BUN 52, CR 3.01, calcium 7.4, albumin -na, magnesium 1.6, phosphorus 7.6 ABG-pH 7.31 PCO2 51 PCO2 52 on 40% FiO2  Date   CR 27 April 1.78 28 April 2.18 29 April 2.37 30 April  2.55 1 May  3.01   Chest x-ray-pulmonary vascular congestion right greater than left Renal ultrasound-not done U A.-not done   Assessment/Plan: 1. Acute kidney injury-hypotension,  Cardiac arrest, contrast nephropathy likely culprits. He is currently nonoliguric. Chest x-ray shows vascular congestion. I would continue diuretics at this point. Check urinalysis and renal ultrasound . 2.  Cardiac arrest with prolonged CPR-per Dr. Nelda Marseille and critical care medicine. He is scheduled for MRI of brain later today. Continue amiodarone. 3. Shock/hypotension-remains on norepinephrine. Prior history ischemic cardiomyopathy. No new suggestions  We'll follow  Sid Greener F 01/03/2013, 2:07 PM

## 2013-01-03 NOTE — Progress Notes (Signed)
PULMONARY  / CRITICAL CARE MEDICINE  Name: Logan White MRN: [id number] DOB: 1941-08-03    ADMISSION DATE:  12/30/2012  REFERRING MD :  Dr. Tomi Bamberger PRIMARY SERVICE: PCCM  CHIEF COMPLAINT:  Cardiac arrest  BRIEF PATIENT DESCRIPTION:  [name] is a 72 year old man with PMH of ischemic cardiomyopathy, A.fib on warfarin, CVA, DM2, CKD, HTN, and previous cardiac arrest who presented to the [facility] via EMS with VF/VT arrest with possible STEMI, gained pulse, started on Arctic Sun protocol to cardiac cath.   SIGNIFICANT EVENTS / STUDIES:  4/27 VT/VT Cardiac arrest with ~30 minutes of CPR  4/27 Initiation of Arctic Sun protocol 4/27 CXR cardiomegaly with mild pulmonary edema 4/28 Cardiac cath- no culprit new lesions.  LINES / TUBES: Left femoral CL 4/27>>> ETT 4/27>>> OGT 4/27>> Foley 4/27>>>   CULTURES: NA  ANTIBIOTICS: NA  SUBJECTIVE:  Cardiac arrest, pulse after 30min of CPR, multiple doses of epi and shock Arctic Sun protocol Cardiac Cath 4/28 early am. On norepinephrine and dopamine drip  VITAL SIGNS: Temp:  [99 F (37.2 C)-99.7 F (37.6 C)] 99.1 F (37.3 C) (05/01 0900) Pulse Rate:  [76-78] 76 (04/30 1524) Resp:  [12-18] 16 (05/01 0900) BP: (134-137)/(56-70) 137/70 mmHg (04/30 2000) SpO2:  [96 %-100 %] 100 % (05/01 0900) FiO2 (%):  [40 %-50 %] 40 % (05/01 0801) Weight:  [86.4 kg (190 lb 7.6 oz)] 86.4 kg (190 lb 7.6 oz) (05/01 0500) HEMODYNAMICS:   VENTILATOR SETTINGS: Vent Mode:  [-] PRVC FiO2 (%):  [40 %-50 %] 40 % Set Rate:  [12 bmp] 12 bmp Vt Set:  [550 mL] 550 mL PEEP:  [5 cmH20] 5 cmH20 Pressure Support:  [10 cmH20] 10 cmH20 Plateau Pressure:  [14 cmH20-21 cmH20] 18 cmH20 INTAKE / OUTPUT: Intake/Output     04/30 0701 - 05/01 0700 05/01 0701 - 05/02 0700   I.V. (mL/kg) 1474.9 (17.1) 200 (2.3)   Other 30    NG/GT 345 80   IV Piggyback     Total Intake(mL/kg) 1849.9 (21.4) 280 (3.2)   Urine (mL/kg/hr) 3675 (1.8) 175 (0.6)   Emesis/NG output     Stool 130 (0.1)    Total Output 3805 175   Net -1955.1 +105         PHYSICAL EXAMINATION: General:  Intubated, minimal sedation, not arousable or response to command. Neuro:  Withdraws to pain but not following command. HEENT: ETT in place. Cardiovascular:  S1, s2, irregularly irregular rhythm. Lungs:  Bibasilar crackles, mechanical breath sounds. Abdomen:  Soft, no distended. Musculoskeletal:  Good tone. Skin:  Intact.  LABS:  Recent Labs Lab 12/30/12 2312  12/31/12 0230 12/31/12 0231  12/31/12 0700  12/31/12 1210 12/31/12 1431  12/31/12 1700  12/31/12 2115  01/01/13 0500 01/01/13 0505 01/01/13 0548 01/01/13 0759  01/01/13 1900  01/02/13 0420 01/02/13 0422 01/02/13 1150 01/02/13 2000 01/03/13 0259 01/03/13 0342 01/03/13 0432  HGB 14.5  < > 16.7  --   --   --   < >  --   --   --   --   --   --   --   --  17.6*  --   --   --   --   --  16.4  --   --   --   --   --  15.5  WBC 8.9  --  14.2*  --   --   --   --   --   --   --   --   --   --   --   --  6.3  --   --   --   --   --  12.5*  --   --   --   --   --  7.9  PLT 101*  --  207  --   --   --   --   --   --   --   --   --   --   --   --  165  --   --   --   --   --  147*  --   --   --   --   --  134*  NA 142  < > 135  --   < > 138  < > 136  --   --  138  --  139  < >  --  136  --  137  < > 140  --  141  --   --   --   --   --  138  K 3.9  < > 3.9  --   < > 2.7*  < > 2.8*  --   --  2.9*  --  3.4*  < >  --  3.6  --  3.6  < > 5.1  --  5.2*  --   --   --   --   --  4.7  CL 102  < > 105  --   < > 103  < > 105  --   --  106  --  105  < >  --  100  --  101  < > 102  --  102  --   --   --   --   --  97  CO2 18*  --  15*  --   < > 16*  --  16*  --   --  19  --  20  < >  --  23  --  25  < > 26  --  24  --   --   --   --   --  27  GLUCOSE 307*  < > 240*  --   < > 295*  < > 217*  --   --  135*  --  139*  < >  --  155*  --  107*  < > 92  --  107*  --   --   --   --   --  131*  BUN 16  < > 22  --   < > 25*  < > 28*  --   --  32*  --   32*  < >  --  35*  --  35*  < > 38*  --  42*  --   --   --   --   --  52*  CREATININE 1.78*  < > 2.02*  --   < > 2.16*  < > 2.17*  --   --  2.25*  --  2.18*  < >  --  2.16*  --  2.20*  < > 2.37*  --  2.55*  --   --   --   --   --  3.01*  CALCIUM 13.4*  --  9.9  --   < > 9.8  --  9.5  --   --  9.2  --  8.8  < >  --  8.4  --  8.3*  < >  7.7*  --  7.6*  --   --   --   --   --  7.4*  MG  --   < > 3.4*  --   --   --   --   --   --   --   --   --   --   --   --  2.0  --   --   --   --   --  1.7  --   --   --   --   --  1.6  PHOS  --   < > 6.2*  --   --   --   --   --   --   --   --   --   --   --   --  1.3*  --   --   --   --   --  5.8*  --   --   --   --   --  7.6*  AST 135*  --  282*  --   --   --   --   --   --   --   --   --   --   --   --   --   --  264*  --   --   --   --   --   --   --   --   --   --   ALT 109*  --  154*  --   --   --   --   --   --   --   --   --   --   --   --   --   --  125*  --   --   --   --   --   --   --   --   --   --   ALKPHOS 135*  --  195*  --   --   --   --   --   --   --   --   --   --   --   --   --   --  149*  --   --   --   --   --   --   --   --   --   --   BILITOT 0.2*  --  0.4  --   --   --   --   --   --   --   --   --   --   --   --   --   --  0.8  --   --   --   --   --   --   --   --   --   --   PROT 5.2*  --  6.3  --   --   --   --   --   --   --   --   --   --   --   --   --   --  5.3*  --   --   --   --   --   --   --   --   --   --   ALBUMIN 2.3*  --  2.9*  --   --   --   --   --   --   --   --   --   --   --   --   --   --  2.2*  --   --   --   --   --   --   --   --   --   --   APTT 54*  --  163*  --   --   --   --  75*  --   --   --   --   --   --   --   --   --   --   --   --   --   --   --   --   --   --   --   --   INR 2.72*  --  9.05*  --   --   --   --  3.64*  --   --  3.62*  --  3.75*  --  2.86*  --   --   --   --   --   --   --   --   --   --   --   --   --   LATICACIDVEN  --   --   --   --   --   --   < >  --   --   < >  --   < >  --   --   --   --    --  3.2*  < > 2.9*  < >  --   --  2.6* 3.0* 3.0*  --   --   TROPONINI 0.35*  --   --  2.78*  --  6.90*  --   --  19.30*  --   --   --   --   --   --   --   --   --   --   --   --   --   --   --   --   --   --   --   PHART  --   < >  --   --   --   --   < >  --   --   --   --   --   --   < >  --   --  7.589*  --   --   --   --   --  7.487*  --   --   --  7.311*  --   PCO2ART  --   < >  --   --   --   --   < >  --   --   --   --   --   --   < >  --   --  25.7*  --   --   --   --   --  30.2*  --   --   --  51.9*  --   PO2ART  --   < >  --   --   --   --   < >  --   --   --   --   --   --   < >  --   --  245.0*  --   --   --   --   --  119.0*  --   --   --  51.1*  --   < > = values in this interval not displayed.  Recent Labs Lab 01/02/13 1758 01/02/13 1954 01/02/13 2323  01/03/13 0346 01/03/13 0821  GLUCAP 117* 105* 133* 121* 121*   CXR: 4/27 Cardiomegaly and mild pulmonary edema CXr: 4/28 No new infiltrates. CXR: 4/29- increasing right mid and lower lung air space disease.  ASSESSMENT / PLAN:  PULMONARY A: VDRF - 2/2 cardiac arrest P:   - Begin PS trials but no extubation due to poor mental status. - Portable chest x-ray in a.m. - Diureses as ordered.  CARDIOVASCULAR A: VF/VT arrest-  STEMI- status post PCI and stenting of CFx, troponins 6.9<2.78<0.35 Arctic Sun protocol P:  - Per cardiology. - Aspirin and Plavix. No statin secondary to elevated LFTs and no beta blocker or Ace inhibitors secondary to hypertension and AKI. - Artic sun protocol complete. - Levophed for BP support.  RENAL A:  Acute on chronic kidney disease. Baseline CKD stage 3 - Baseline Cr 1.5, 1.7 on admission Creatinine 2.55 <2.2<2.16<2.02<1.78-likely secondary to PCI. P:   - Trend K, Mg, Phos. - Lasix as ordered. - Replace Mg. - Renal consult called  GASTROINTESTINAL A:  No active issues P:   - Protonix 40mg  IV daily for SUP. - TF as ordered.  HEMATOLOGIC A:   Leukocytosis- resolved. WBC 12< 6.3  <14.2< 8.9. Likely stress-related. P:  - CBC in AM.  INFECTIOUS A:  No active issues P:   - Monitor fever curve and WBC.  ENDOCRINE A:  Hyperglycemia - undiagnosed DM2? P:   - CBG q 4h. - SSI.  NEUROLOGIC A:  Risk for brain anoxia 2/2 cardiac arrest P:   - Fentanyl and Versed for sedation while on vent - EEG and CT noted, MRI ordered and pending. - Neuro consult appreciated, prognostication pending.  TODAY'S SUMMARY: Cardiac arrest 2/2 STEMI. CPR for ~30 minutes with return of pulse. Intubated. [employer] protocol initiated, cath lab- PCI to circumflex- flow not restored. LAD 80% apical lesion.  Neuro status remains very poor, not a candidate for extubation.  Family wishes for full support, MRI ordered and pending.   I have personally obtained a history, examined the patient, evaluated laboratory and imaging results, formulated the assessment and plan and placed orders.  CRITICAL CARE: The patient is critically ill with multiple organ systems failure and requires high complexity decision making for assessment and support, frequent evaluation and titration of therapies, application of advanced monitoring technologies and extensive interpretation of multiple databases. Critical Care Time devoted to patient care services described in this note is 35 minutes.   Rush Farmer, M.D. Pulmonary and [place] Pager: [phone]  01/03/2013, 10:19 AM

## 2013-01-04 ENCOUNTER — Inpatient Hospital Stay (HOSPITAL_COMMUNITY): Payer: Medicare Other

## 2013-01-04 DIAGNOSIS — F05 Delirium due to known physiological condition: Secondary | ICD-10-CM

## 2013-01-04 LAB — GLUCOSE, CAPILLARY
Glucose-Capillary: 137 mg/dL — ABNORMAL HIGH (ref 70–99)
Glucose-Capillary: 140 mg/dL — ABNORMAL HIGH (ref 70–99)
Glucose-Capillary: 181 mg/dL — ABNORMAL HIGH (ref 70–99)
Glucose-Capillary: 111 mg/dL — ABNORMAL HIGH (ref 70–99)
Glucose-Capillary: 119 mg/dL — ABNORMAL HIGH (ref 70–99)

## 2013-01-04 LAB — CBC
MCH: 27.8 pg (ref 26.0–34.0)
MCHC: 37.6 g/dL — ABNORMAL HIGH (ref 30.0–36.0)
Platelets: 132 10*3/uL — ABNORMAL LOW (ref 150–400)

## 2013-01-04 LAB — POCT I-STAT 3, ART BLOOD GAS (G3+)
O2 Saturation: 92 %
TCO2: 32 mmol/L (ref 0–100)
pCO2 arterial: 47.3 mmHg — ABNORMAL HIGH (ref 35.0–45.0)
pH, Arterial: 7.412 (ref 7.350–7.450)
pO2, Arterial: 64 mmHg — ABNORMAL LOW (ref 80.0–100.0)

## 2013-01-04 LAB — BASIC METABOLIC PANEL
BUN: 56 mg/dL — ABNORMAL HIGH (ref 6–23)
Calcium: 8.3 mg/dL — ABNORMAL LOW (ref 8.4–10.5)
GFR calc non Af Amer: 22 mL/min — ABNORMAL LOW (ref >90)
Glucose, Bld: 161 mg/dL — ABNORMAL HIGH (ref 70–99)

## 2013-01-04 LAB — TROPONIN I: Troponin I: 12.35 ng/mL (ref <0.30)

## 2013-01-04 LAB — MAGNESIUM: Magnesium: 1.6 mg/dL (ref 1.5–2.5)

## 2013-01-04 MED ORDER — MIDAZOLAM HCL 2 MG/2ML IJ SOLN
INTRAMUSCULAR | Status: AC
  Filled 2013-01-04: qty 4

## 2013-01-04 MED ORDER — ACETAMINOPHEN 160 MG/5ML PO SOLN
650.0000 mg | ORAL | Status: DC | PRN
  Administered 2013-01-04: 650 mg via ORAL
  Filled 2013-01-04 (×2): qty 20.3

## 2013-01-04 MED ORDER — FUROSEMIDE 10 MG/ML IJ SOLN
40.0000 mg | Freq: Four times a day (QID) | INTRAMUSCULAR | Status: DC
  Administered 2013-01-04: 40 mg via INTRAVENOUS
  Filled 2013-01-04: qty 4

## 2013-01-04 MED ORDER — POTASSIUM CHLORIDE 20 MEQ/15ML (10%) PO LIQD
40.0000 meq | Freq: Three times a day (TID) | ORAL | Status: AC
  Administered 2013-01-04 (×2): 40 meq
  Filled 2013-01-04 (×2): qty 30

## 2013-01-04 MED ORDER — FUROSEMIDE 10 MG/ML IJ SOLN
40.0000 mg | Freq: Two times a day (BID) | INTRAMUSCULAR | Status: DC
  Administered 2013-01-04 – 2013-01-07 (×7): 40 mg via INTRAVENOUS
  Filled 2013-01-04 (×14): qty 4

## 2013-01-04 MED ORDER — MAGNESIUM SULFATE 40 MG/ML IJ SOLN
2.0000 g | Freq: Once | INTRAMUSCULAR | Status: AC
  Administered 2013-01-04: 2 g via INTRAVENOUS
  Filled 2013-01-04: qty 50

## 2013-01-04 NOTE — Progress Notes (Signed)
Subjective: Patient is responsive at this point, although he is still intubated and on mechanical ventilation.  Objective: Current vital signs: BP 119/89  Pulse 83  Temp(Src) 98.8 F (37.1 C) (Core (Comment))  Resp 19  Ht 6\' 2"  (1.88 m)  Wt 83.9 kg (184 lb 15.5 oz)  BMI 23.74 kg/m2  SpO2 100%  Neurologic Exam: Patient appears to be alert and follows voluntary commands well. He is intubated and has spontaneous respirations, however. Pupils were equal and reacted normally to light. Extraocular movements were full and conjugate with voluntary movement. Face was symmetrical with no focal weakness. Patient moved extremities equally to command with good strength throughout.  Lab Results: Results for orders placed during the hospital encounter of 12/30/12 (from the past 48 hour(s))  GLUCOSE, CAPILLARY     Status: Abnormal   Collection Time    01/02/13  5:58 PM      Result Value Range   Glucose-Capillary 117 (*) 70 - 99 mg/dL  GLUCOSE, CAPILLARY     Status: Abnormal   Collection Time    01/02/13  7:54 PM      Result Value Range   Glucose-Capillary 105 (*) 70 - 99 mg/dL  LACTIC ACID, PLASMA     Status: Abnormal   Collection Time    01/02/13  8:00 PM      Result Value Range   Lactic Acid, Venous 3.0 (*) 0.5 - 2.2 mmol/L  GLUCOSE, CAPILLARY     Status: Abnormal   Collection Time    01/02/13 11:23 PM      Result Value Range   Glucose-Capillary 133 (*) 70 - 99 mg/dL  LACTIC ACID, PLASMA     Status: Abnormal   Collection Time    01/03/13  2:59 AM      Result Value Range   Lactic Acid, Venous 3.0 (*) 0.5 - 2.2 mmol/L  BLOOD GAS, ARTERIAL     Status: Abnormal   Collection Time    01/03/13  3:42 AM      Result Value Range   FIO2 0.40     Delivery systems VENTILATOR     Mode PRESSURE REGULATED VOLUME CONTROL     VT 550     Rate 12     Peep/cpap 5.0     pH, Arterial 7.311 (*) 7.350 - 7.450   pCO2 arterial 51.9 (*) 35.0 - 45.0 mmHg   pO2, Arterial 51.1 (*) 80.0 - 100.0 mmHg    Bicarbonate 25.4 (*) 20.0 - 24.0 mEq/L   TCO2 27.0  0 - 100 mmol/L   Acid-base deficit 0.0  0.0 - 2.0 mmol/L   O2 Saturation 83.3     Patient temperature 98.6     Collection site A-LINE     Drawn by [phone]     Sample type ARTERIAL DRAW    GLUCOSE, CAPILLARY     Status: Abnormal   Collection Time    01/03/13  3:46 AM      Result Value Range   Glucose-Capillary 121 (*) 70 - 99 mg/dL  CBC     Status: Abnormal   Collection Time    01/03/13  4:32 AM      Result Value Range   WBC 7.9  4.0 - 10.5 K/uL   RBC 5.50  4.22 - 5.81 MIL/uL   Hemoglobin 15.5  13.0 - 17.0 g/dL   HCT 41.7  39.0 - 52.0 %   MCV 75.8 (*) 78.0 - 100.0 fL   MCH 28.2  26.0 -  34.0 pg   MCHC 37.2 (*) 30.0 - 36.0 g/dL   Comment: TARGET CELLS   RDW 14.2  11.5 - 15.5 %   Platelets 134 (*) 150 - 400 K/uL  BASIC METABOLIC PANEL     Status: Abnormal   Collection Time    01/03/13  4:32 AM      Result Value Range   Sodium 138  135 - 145 mEq/L   Potassium 4.7  3.5 - 5.1 mEq/L   Chloride 97  96 - 112 mEq/L   CO2 27  19 - 32 mEq/L   Glucose, Bld 131 (*) 70 - 99 mg/dL   BUN 52 (*) 6 - 23 mg/dL   Creatinine, Ser 3.01 (*) 0.50 - 1.35 mg/dL   Calcium 7.4 (*) 8.4 - 10.5 mg/dL   GFR calc non Af Amer 20 (*) >90 mL/min   GFR calc Af Amer 23 (*) >90 mL/min   Comment:            The eGFR has been calculated     using the CKD EPI equation.     This calculation has not been     validated in all clinical     situations.     eGFR's persistently     <90 mL/min signify     possible Chronic Kidney Disease.  MAGNESIUM     Status: None   Collection Time    01/03/13  4:32 AM      Result Value Range   Magnesium 1.6  1.5 - 2.5 mg/dL  PHOSPHORUS     Status: Abnormal   Collection Time    01/03/13  4:32 AM      Result Value Range   Phosphorus 7.6 (*) 2.3 - 4.6 mg/dL  GLUCOSE, CAPILLARY     Status: Abnormal   Collection Time    01/03/13  8:21 AM      Result Value Range   Glucose-Capillary 121 (*) 70 - 99 mg/dL  LACTIC ACID,  PLASMA     Status: Abnormal   Collection Time    01/03/13 10:20 AM      Result Value Range   Lactic Acid, Venous 2.4 (*) 0.5 - 2.2 mmol/L  GLUCOSE, CAPILLARY     Status: Abnormal   Collection Time    01/03/13 12:22 PM      Result Value Range   Glucose-Capillary 131 (*) 70 - 99 mg/dL  GLUCOSE, CAPILLARY     Status: Abnormal   Collection Time    01/03/13  4:18 PM      Result Value Range   Glucose-Capillary 137 (*) 70 - 99 mg/dL  URINALYSIS, ROUTINE W REFLEX MICROSCOPIC     Status: Abnormal   Collection Time    01/03/13  4:56 PM      Result Value Range   Color, Urine YELLOW  YELLOW   APPearance CLEAR  CLEAR   Specific Gravity, Urine 1.012  1.005 - 1.030   pH 6.0  5.0 - 8.0   Glucose, UA NEGATIVE  NEGATIVE mg/dL   Hgb urine dipstick LARGE (*) NEGATIVE   Bilirubin Urine NEGATIVE  NEGATIVE   Ketones, ur NEGATIVE  NEGATIVE mg/dL   Protein, ur 30 (*) NEGATIVE mg/dL   Urobilinogen, UA 1.0  0.0 - 1.0 mg/dL   Nitrite NEGATIVE  NEGATIVE   Leukocytes, UA NEGATIVE  NEGATIVE  URINE MICROSCOPIC-ADD ON     Status: Abnormal   Collection Time    01/03/13  4:56 PM  Result Value Range   Squamous Epithelial / LPF RARE  RARE   WBC, UA 0-2  <3 WBC/hpf   Casts GRANULAR CAST (*) NEGATIVE  LACTIC ACID, PLASMA     Status: Abnormal   Collection Time    01/03/13  8:00 PM      Result Value Range   Lactic Acid, Venous 2.3 (*) 0.5 - 2.2 mmol/L  GLUCOSE, CAPILLARY     Status: Abnormal   Collection Time    01/03/13  8:34 PM      Result Value Range   Glucose-Capillary 140 (*) 70 - 99 mg/dL  GLUCOSE, CAPILLARY     Status: Abnormal   Collection Time    01/03/13 11:32 PM      Result Value Range   Glucose-Capillary 181 (*) 70 - 99 mg/dL  POCT I-STAT 3, BLOOD GAS (G3+)     Status: Abnormal   Collection Time    01/04/13  3:10 AM      Result Value Range   pH, Arterial 7.412  7.350 - 7.450   pCO2 arterial 47.3 (*) 35.0 - 45.0 mmHg   pO2, Arterial 64.0 (*) 80.0 - 100.0 mmHg   Bicarbonate 30.2 (*)  20.0 - 24.0 mEq/L   TCO2 32  0 - 100 mmol/L   O2 Saturation 92.0     Acid-Base Excess 4.0 (*) 0.0 - 2.0 mmol/L   Patient temperature 98.6 F     Collection site RADIAL, ALLEN'S TEST ACCEPTABLE     Drawn by RT     Sample type ARTERIAL    GLUCOSE, CAPILLARY     Status: Abnormal   Collection Time    01/04/13  3:15 AM      Result Value Range   Glucose-Capillary 134 (*) 70 - 99 mg/dL  BASIC METABOLIC PANEL     Status: Abnormal   Collection Time    01/04/13  3:55 AM      Result Value Range   Sodium 139  135 - 145 mEq/L   Potassium 3.5  3.5 - 5.1 mEq/L   Chloride 95 (*) 96 - 112 mEq/L   CO2 29  19 - 32 mEq/L   Glucose, Bld 161 (*) 70 - 99 mg/dL   BUN 56 (*) 6 - 23 mg/dL   Creatinine, Ser 2.71 (*) 0.50 - 1.35 mg/dL   Calcium 8.3 (*) 8.4 - 10.5 mg/dL   GFR calc non Af Amer 22 (*) >90 mL/min   GFR calc Af Amer 26 (*) >90 mL/min   Comment:            The eGFR has been calculated     using the CKD EPI equation.     This calculation has not been     validated in all clinical     situations.     eGFR's persistently     <90 mL/min signify     possible Chronic Kidney Disease.  CBC     Status: Abnormal   Collection Time    01/04/13  3:55 AM      Result Value Range   WBC 4.6  4.0 - 10.5 K/uL   Comment: WHITE COUNT CONFIRMED ON SMEAR   RBC 5.26  4.22 - 5.81 MIL/uL   Hemoglobin 14.6  13.0 - 17.0 g/dL   HCT 38.8 (*) 39.0 - 52.0 %   MCV 73.8 (*) 78.0 - 100.0 fL   MCH 27.8  26.0 - 34.0 pg   MCHC 37.6 (*) 30.0 - 36.0 g/dL  Comment: TARGET CELLS   RDW 14.2  11.5 - 15.5 %   Platelets 132 (*) 150 - 400 K/uL  MAGNESIUM     Status: None   Collection Time    01/04/13  3:55 AM      Result Value Range   Magnesium 1.6  1.5 - 2.5 mg/dL  PHOSPHORUS     Status: None   Collection Time    01/04/13  3:55 AM      Result Value Range   Phosphorus 4.6  2.3 - 4.6 mg/dL  LACTIC ACID, PLASMA     Status: None   Collection Time    01/04/13  4:30 AM      Result Value Range   Lactic Acid, Venous 2.2   0.5 - 2.2 mmol/L  GLUCOSE, CAPILLARY     Status: Abnormal   Collection Time    01/04/13  7:19 AM      Result Value Range   Glucose-Capillary 155 (*) 70 - 99 mg/dL  LACTIC ACID, PLASMA     Status: None   Collection Time    01/04/13  8:51 AM      Result Value Range   Lactic Acid, Venous 1.9  0.5 - 2.2 mmol/L  TROPONIN I     Status: Abnormal   Collection Time    01/04/13  9:17 AM      Result Value Range   Troponin I 12.35 (*) <0.30 ng/mL   Comment:            Due to the release kinetics of cTnI,     a negative result within the first hours     of the onset of symptoms does not rule out     myocardial infarction with certainty.     If myocardial infarction is still suspected,     repeat the test at appropriate intervals.     CRITICAL RESULT CALLED TO, READ BACK BY AND VERIFIED WITH:     HOUT,L RN @ 1001 ON 01/04/13 BY LEONARD,A    Studies/Results: US Renal Port  01/03/2013  *RADIOLOGY REPORT*  Clinical Data: Worsening renal function, evaluate for hydronephrosis  RENAL/URINARY TRACT ULTRASOUND COMPLETE  Comparison:  CT chest abdomen pelvis 01/07/2007  Findings:  Right Kidney:  Diffusely echogenic renal parenchyma.  The contour is normal and reniform.  No hydronephrosis or focal renal lesion. The kidney measures 10.6 cm in length.  Left Kidney:  Diffusely echogenic renal parenchyma.  The renal contour is normal.  No hydronephrosis or focal lesion.  The kidney measures 10.5 cm in length.  Bladder:  Foley catheter in the collapsed bladder.  Other:  Small 1.1 x 1.2 cm cyst in hepatic segment five of the liver adjacent to the gallbladder fossa.  Cholelithiasis is noted. There is also mild gallbladder wall thickening.  IMPRESSION:  1.  No hydronephrosis. 2.  Echogenic renal parenchyma consistent with underlying medical renal disease 3.  Cholelithiasis with gallbladder wall thickening. Query symptoms of chronic cholecystitis.   Original Report Authenticated By: Jacqulynn Cadet, M.D.    Dg Chest Port  1 View  01/04/2013  *RADIOLOGY REPORT*  Clinical Data: Endotracheal tube placement.  PORTABLE CHEST - 1 VIEW  Comparison: 01/03/2013.  Findings: Endotracheal tube is in satisfactory position. Nasogastric tube is followed into the stomach.  Heart size stable. There is diffuse bilateral air space disease, bibasilar dependent. No definite pleural fluid.  IMPRESSION: Pulmonary edema, stable.   Original Report Authenticated By: Lorin Picket, M.D.    Dg Chest [facility]  1 View  01/03/2013  *RADIOLOGY REPORT*  Clinical Data: Evaluate endotracheal tube positioning  PORTABLE CHEST - 1 VIEW  Comparison: 01/02/2013; 12/30/2012; 11/24/2012  Findings: Grossly unchanged cardiac silhouette and mediastinal contours with atherosclerotic calcification of the thoracic aorta. Stable position of support apparatus including transcutaneous pacer's devices.  Pulmonary vasculature is slightly less distinct on the present examination.  Worsening perihilar medial basilar opacities, right greater than left.  No definite pleural effusion or pneumothorax.  Unchanged bones.  IMPRESSION: 1.  Stable positioning of support apparatus.  No pneumothorax. 2.  Suspected worsening pulmonary edema and perihilar/bibasilar atelectasis.   Original Report Authenticated By: Jake Seats, MD     Medications: I have reviewed the patient's current medications.  Assessment/Plan: Hypoxic encephalopathy with marked improvement and level of responsiveness. Patient has regained consciousness and communicates appropriately with following commands. No focal weakness was noted.  Recommend no changes in current management. No further neurodiagnostic studies are indicated at this point. We will sign off on his care at this point, but will remain available for reevaluation if indicated.  C.R. Nicole Kindred, MD [facility]  01/04/2013  12:54 PM

## 2013-01-04 NOTE — Progress Notes (Signed)
PULMONARY  / CRITICAL CARE MEDICINE  Name: Logan White MRN: [id number] DOB: 1940/09/23    ADMISSION DATE:  12/30/2012  REFERRING MD :  Dr. Tomi Bamberger PRIMARY SERVICE: PCCM  CHIEF COMPLAINT:  Cardiac arrest  BRIEF PATIENT DESCRIPTION:  [name] is a 72 year old man with PMH of ischemic cardiomyopathy, A.fib on warfarin, CVA, DM2, CKD, HTN, and previous cardiac arrest who presented to the [facility] via EMS with VF/VT arrest with possible STEMI, gained pulse, started on Arctic Sun protocol to cardiac cath.   SIGNIFICANT EVENTS / STUDIES:  4/27 VT/VT Cardiac arrest with ~30 minutes of CPR  4/27 Initiation of Arctic Sun protocol 4/27 CXR cardiomegaly with mild pulmonary edema 4/28 Cardiac cath- no culprit new lesions.  LINES / TUBES: Left femoral CL 4/27>>> ETT 4/27>>> OGT 4/27>> Foley 4/27>>>   CULTURES: NA  ANTIBIOTICS: NA  SUBJECTIVE:  Cardiac arrest, pulse after 79min of CPR, multiple doses of epi and shock Arctic Sun protocol Cardiac Cath 4/28 early am. On norepinephrine and dopamine drip  VITAL SIGNS: Temp:  [99 F (37.2 C)-101.7 F (38.7 C)] 99 F (37.2 C) (05/02 0900) Pulse Rate:  [83] 83 (05/01 2000) Resp:  [13-35] 20 (05/02 0900) BP: (119)/(89) 119/89 mmHg (05/02 0400) SpO2:  [85 %-100 %] 100 % (05/02 0900) FiO2 (%):  [40 %] 40 % (05/02 0800) Weight:  [83.9 kg (184 lb 15.5 oz)] 83.9 kg (184 lb 15.5 oz) (05/02 0500) HEMODYNAMICS:   VENTILATOR SETTINGS: Vent Mode:  [-] PSV FiO2 (%):  [40 %] 40 % Set Rate:  [12 bmp] 12 bmp Vt Set:  [550 mL] 550 mL PEEP:  [5 cmH20] 5 cmH20 Pressure Support:  [10 cmH20] 10 cmH20 Plateau Pressure:  [14 cmH20-20 cmH20] 17 cmH20 INTAKE / OUTPUT: Intake/Output     05/01 0701 - 05/02 0700 05/02 0701 - 05/03 0700   I.V. (mL/kg) 1861 (22.2) 101.6 (1.2)   Other     NG/GT 740 110   IV Piggyback 50    Total Intake(mL/kg) 2651 (31.6) 211.6 (2.5)   Urine (mL/kg/hr) 5375 (2.7) 250 (0.9)   Stool     Total Output 5375 250   Net  -2724 -38.4         PHYSICAL EXAMINATION: General: Intubated, on no sedation, arousable and follows command with delay. Neuro: Withdraws to pain but not following command. HEENT: ETT in place. Cardiovascular:  S1, s2, irregularly irregular rhythm. Lungs:  Bibasilar crackles, mechanical breath sounds. Abdomen:  Soft, no distended. Musculoskeletal:  Good tone. Skin:  Intact.  LABS:  Recent Labs Lab 12/30/12 2312  12/31/12 0230  12/31/12 0700  12/31/12 1210 12/31/12 1431  12/31/12 1700  12/31/12 2115  01/01/13 0500  01/01/13 0759  01/02/13 0420 01/02/13 0422  01/03/13 0342 01/03/13 0432  01/03/13 2000 01/04/13 0310 01/04/13 0355 01/04/13 0430 01/04/13 0851 01/04/13 0917  HGB 14.5  < > 16.7  --   --   < >  --   --   --   --   --   --   --   --   < >  --   --  16.4  --   --   --  15.5  --   --   --  14.6  --   --   --   WBC 8.9  --  14.2*  --   --   --   --   --   --   --   --   --   --   --   < >  --   --  12.5*  --   --   --  7.9  --   --   --  4.6  --   --   --   PLT 101*  --  207  --   --   --   --   --   --   --   --   --   --   --   < >  --   --  147*  --   --   --  134*  --   --   --  132*  --   --   --   NA 142  < > 135  < > 138  < > 136  --   --  138  --  139  < >  --   < > 137  < > 141  --   --   --  138  --   --   --  139  --   --   --   K 3.9  < > 3.9  < > 2.7*  < > 2.8*  --   --  2.9*  --  3.4*  < >  --   < > 3.6  < > 5.2*  --   --   --  4.7  --   --   --  3.5  --   --   --   CL 102  < > 105  < > 103  < > 105  --   --  106  --  105  < >  --   < > 101  < > 102  --   --   --  97  --   --   --  95*  --   --   --   CO2 18*  --  15*  < > 16*  --  16*  --   --  19  --  20  < >  --   < > 25  < > 24  --   --   --  27  --   --   --  29  --   --   --   GLUCOSE 307*  < > 240*  < > 295*  < > 217*  --   --  135*  --  139*  < >  --   < > 107*  < > 107*  --   --   --  131*  --   --   --  161*  --   --   --   BUN 16  < > 22  < > 25*  < > 28*  --   --  32*  --  32*  < >  --   < >  35*  < > 42*  --   --   --  52*  --   --   --  56*  --   --   --   CREATININE 1.78*  < > 2.02*  < > 2.16*  < > 2.17*  --   --  2.25*  --  2.18*  < >  --   < > 2.20*  < > 2.55*  --   --   --  3.01*  --   --   --  2.71*  --   --   --   CALCIUM 13.4*  --  9.9  < >  9.8  --  9.5  --   --  9.2  --  8.8  < >  --   < > 8.3*  < > 7.6*  --   --   --  7.4*  --   --   --  8.3*  --   --   --   MG  --   --  3.4*  --   --   --   --   --   --   --   --   --   --   --   < >  --   --  1.7  --   --   --  1.6  --   --   --  1.6  --   --   --   PHOS  --   --  6.2*  --   --   --   --   --   --   --   --   --   --   --   < >  --   --  5.8*  --   --   --  7.6*  --   --   --  4.6  --   --   --   AST 135*  --  282*  --   --   --   --   --   --   --   --   --   --   --   --  264*  --   --   --   --   --   --   --   --   --   --   --   --   --   ALT 109*  --  154*  --   --   --   --   --   --   --   --   --   --   --   --  125*  --   --   --   --   --   --   --   --   --   --   --   --   --   ALKPHOS 135*  --  195*  --   --   --   --   --   --   --   --   --   --   --   --  149*  --   --   --   --   --   --   --   --   --   --   --   --   --   BILITOT 0.2*  --  0.4  --   --   --   --   --   --   --   --   --   --   --   --  0.8  --   --   --   --   --   --   --   --   --   --   --   --   --   PROT 5.2*  --  6.3  --   --   --   --   --   --   --   --   --   --   --   --  5.3*  --   --   --   --   --   --   --   --   --   --   --   --   --  ALBUMIN 2.3*  --  2.9*  --   --   --   --   --   --   --   --   --   --   --   --  2.2*  --   --   --   --   --   --   --   --   --   --   --   --   --   APTT 54*  --  163*  --   --   --  75*  --   --   --   --   --   --   --   --   --   --   --   --   --   --   --   --   --   --   --   --   --   --   INR 2.72*  --  9.05*  --   --   --  3.64*  --   --  3.62*  --  3.75*  --  2.86*  --   --   --   --   --   --   --   --   --   --   --   --   --   --   --   LATICACIDVEN  --   --   --   --   --   <  >  --   --   < >  --   < >  --   --   --   --  3.2*  < >  --   --   < >  --   --   < > 2.3*  --   --  2.2 1.9  --   TROPONINI 0.35*  --   --   < > 6.90*  --   --  19.30*  --   --   --   --   --   --   --   --   --   --   --   --   --   --   --   --   --   --   --   --  12.35*  PHART  --   < >  --   --   --   < >  --   --   --   --   --   --   < >  --   < >  --   --   --  7.487*  --  7.311*  --   --   --  7.412  --   --   --   --   PCO2ART  --   < >  --   --   --   < >  --   --   --   --   --   --   < >  --   < >  --   --   --  30.2*  --  51.9*  --   --   --  47.3*  --   --   --   --   PO2ART  --   < >  --   --   --   < >  --   --   --   --   --   --   < >  --   < >  --   --   --  119.0*  --  51.1*  --   --   --  64.0*  --   --   --   --   < > = values in this interval not displayed.  Recent Labs Lab 01/03/13 1618 01/03/13 2034 01/03/13 2332 01/04/13 0315 01/04/13 0719  GLUCAP 137* 140* 181* 134* 155*   CXR: 4/27 Cardiomegaly and mild pulmonary edema CXr: 4/28 No new infiltrates. CXR: 4/29- increasing right mid and lower lung air space disease.  ASSESSMENT / PLAN:  PULMONARY A: VDRF - 2/2 cardiac arrest P:   - Continue PS trials but no extubation due to poor mental status and weak gag. - Portable chest x-ray in a.m. - Diureses as ordered. - Asked family 5/1 to consider option of trach/peg.  CARDIOVASCULAR A: VF/VT arrest-  STEMI- status post PCI and stenting of CFx, troponins 6.9<2.78<0.35 Arctic Sun protocol P:  - Per cardiology. - Aspirin and Plavix. No statin secondary to elevated LFTs and no beta blocker or Ace inhibitors secondary to hypertension and AKI. - Artic sun protocol complete. - Levophed for BP support at 15 mcg.  RENAL A:  Acute on chronic kidney disease. Baseline CKD stage 3 - Baseline Cr 1.5, 1.7 on admission Creatinine 2.55 <2.2<2.16<2.02<1.78-likely secondary to PCI. P:   - Trend K, Mg, Phos. - Lasix as ordered. - Replace K and Mg. - Renal consult  appreciated.  GASTROINTESTINAL A:  No active issues P:   - Protonix 40mg  IV daily for SUP. - TF as ordered.  HEMATOLOGIC A:   Leukocytosis- resolved. WBC 12< 6.3 <14.2< 8.9. Likely stress-related. P:  - CBC in AM.  INFECTIOUS A:  No active issues P:   - Monitor fever curve and WBC.  ENDOCRINE A:  Hyperglycemia - undiagnosed DM2? P:   - CBG q 4h. - SSI.  NEUROLOGIC A:  Risk for brain anoxia 2/2 cardiac arrest P:   - PRN Fentanyl and Versed for sedation while on vent - EEG and CT noted, MRI ordered and pending. - Neuro consult appreciated.  TODAY'S SUMMARY: Cardiac arrest 2/2 STEMI. CPR for ~30 minutes with return of pulse. Intubated. [employer] protocol initiated, cath lab- PCI to circumflex- flow not restored. LAD 80% apical lesion.  Neuro status improving.  Family discussing trach need.   I have personally obtained a history, examined the patient, evaluated laboratory and imaging results, formulated the assessment and plan and placed orders.  CRITICAL CARE: The patient is critically ill with multiple organ systems failure and requires high complexity decision making for assessment and support, frequent evaluation and titration of therapies, application of advanced monitoring technologies and extensive interpretation of multiple databases. Critical Care Time devoted to patient care services described in this note is 35 minutes.   Rush Farmer, M.D. Pulmonary and [place] Pager: [phone]  01/04/2013, 10:10 AM

## 2013-01-04 NOTE — Progress Notes (Signed)
Patient ID: Logan White, male   DOB: 1940/12/17, 72 y.o.   MRN: [id number] Reason for Consult:AKI/CKD Referring Physician: Keyston Soland is an 72 y.o. male.  HPI: Logan White is a 72 y.o. black man admitted on 12/30/2012 after sustaining a cardiac arrest at home. He received 30 minutes CPR with restoration of pulse. He was brought to [facility] emergency room where he received hypothermia. He underwent cardiac catheterization on 28 April. 150 cc of contrast was used. A chronically occluded circumflex coronary artery was noted--unable to revascularize. He has had increasing serum creatinine over last several days and renal consult was requested by Dr. Nelda Marseille.  He receives primary care at [facility] according to his wife. He has had prior cardiac arrest last summer. His wife does not know of any renal disease in the past. He had acute renal failure following cardiac arrest in 2013. Creatinine in December 2013 was 1.5.  The trend in Scr is seen below.   Trend in Creatinine: Creatinine, Ser  Date/Time Value Range Status  01/04/2013  3:55 AM 2.71* 0.50 - 1.35 mg/dL Final  01/03/2013  4:32 AM 3.01* 0.50 - 1.35 mg/dL Final  01/02/2013  4:20 AM 2.55* 0.50 - 1.35 mg/dL Final  01/01/2013  7:00 PM 2.37* 0.50 - 1.35 mg/dL Final  01/01/2013  3:00 PM 2.22* 0.50 - 1.35 mg/dL Final  01/01/2013 12:00 PM 2.16* 0.50 - 1.35 mg/dL Final  01/01/2013  7:59 AM 2.20* 0.50 - 1.35 mg/dL Final  01/01/2013  5:05 AM 2.16* 0.50 - 1.35 mg/dL Final  01/01/2013 12:00 AM 2.20* 0.50 - 1.35 mg/dL Final  12/31/2012  9:15 PM 2.18* 0.50 - 1.35 mg/dL Final  12/31/2012  5:00 PM 2.25* 0.50 - 1.35 mg/dL Final  12/31/2012 12:10 PM 2.17* 0.50 - 1.35 mg/dL Final  12/31/2012 10:08 AM 2.30* 0.50 - 1.35 mg/dL Final  12/31/2012  7:00 AM 2.16* 0.50 - 1.35 mg/dL Final  12/31/2012  5:00 AM 2.14* 0.50 - 1.35 mg/dL Final  12/31/2012  2:30 AM 2.02* 0.50 - 1.35 mg/dL Final  12/30/2012 11:36 PM 1.70* 0.50 - 1.35  mg/dL Final  12/30/2012 11:12 PM 1.78* 0.50 - 1.35 mg/dL Final  08/23/2012  9:02 AM 1.5  0.4 - 1.5 mg/dL Final  05/28/2012 10:55 AM 1.8* 0.4 - 1.5 mg/dL Final  05/11/2012 12:50 PM 1.5  0.4 - 1.5 mg/dL Final  04/30/2012 11:04 AM 1.8* 0.4 - 1.5 mg/dL Final  04/23/2012  9:39 AM 1.4  0.4 - 1.5 mg/dL Final  03/26/2012  1:16 PM 1.5  0.4 - 1.5 mg/dL Final  03/19/2012 11:30 AM 1.6* 0.4 - 1.5 mg/dL Final  03/16/2012  5:44 AM 1.65* 0.50 - 1.35 mg/dL Final  03/15/2012  5:21 AM 1.54* 0.50 - 1.35 mg/dL Final  03/14/2012  5:00 AM 1.68* 0.50 - 1.35 mg/dL Final  03/12/2012  5:00 AM 1.66* 0.50 - 1.35 mg/dL Final  03/11/2012  8:39 AM 1.49* 0.50 - 1.35 mg/dL Final  03/10/2012  5:00 AM 1.25  0.50 - 1.35 mg/dL Final  03/09/2012  5:30 AM 1.30  0.50 - 1.35 mg/dL Final  03/08/2012  5:40 AM 1.24  0.50 - 1.35 mg/dL Final  03/07/2012  5:00 AM 1.34  0.50 - 1.35 mg/dL Final  03/06/2012  4:30 AM 1.50* 0.50 - 1.35 mg/dL Final  03/05/2012  4:20 AM 1.39* 0.50 - 1.35 mg/dL Final  03/04/2012  4:30 AM 1.49* 0.50 - 1.35 mg/dL Final  03/03/2012  3:38 AM 1.61* 0.50 - 1.35  mg/dL Final  03/02/2012  5:00 AM 1.64* 0.50 - 1.35 mg/dL Final  03/01/2012  3:27 AM 1.61* 0.50 - 1.35 mg/dL Final  02/29/2012  5:00 PM 1.66* 0.50 - 1.35 mg/dL Final  02/29/2012 10:09 AM 1.74* 0.50 - 1.35 mg/dL Final  02/29/2012  5:30 AM 1.81* 0.50 - 1.35 mg/dL Final  02/27/2012  3:34 AM 2.00* 0.50 - 1.35 mg/dL Final  02/26/2012  4:15 AM 1.65* 0.50 - 1.35 mg/dL Final  02/25/2012  2:45 AM 1.68* 0.50 - 1.35 mg/dL Final  02/24/2012  1:05 PM 1.27  0.50 - 1.35 mg/dL Final  02/24/2012  8:37 AM 1.19  0.50 - 1.35 mg/dL Final  02/24/2012  4:55 AM 1.14  0.50 - 1.35 mg/dL Final  02/23/2012 10:51 PM 1.18  0.50 - 1.35 mg/dL Final  02/23/2012  6:50 PM 1.29  0.50 - 1.35 mg/dL Final  02/23/2012  3:00 PM 1.35  0.50 - 1.35 mg/dL Final    PMH:   Past Medical History  Diagnosis Date  . Inguinal hernia without mention of obstruction or gangrene, unilateral or unspecified, (not specified as recurrent)   .  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 Peripheral vascular disease, unspecified   . Carotid stenosis     dopplers 1/11: 60-79% bilat.  . Hyperlipidemia     mixed  . Hypertension     a. echo 4/12: EF 50%, asymmetric septal hypertrophy, no SAM or LVOT gradient, LAE, PASP 35  . Coronary artery disease     a. s/p aborted ant STEMI tx with Cypher DES to LAD 10/04 (residual at cath: D1 50%, CFX 40% and multiple dist 70%, EF 55%);   b. myoview 3/10: Ef 47%, infero-apical isch, LOW RISK - med Tx recommended  . CKD (chronic kidney disease)   . Paroxysmal atrial fibrillation 12/2010  . Diverticulosis     2001  . Diabetes mellitus   . H/O: CVA (cardiovascular accident)   . Coronary artery disease     a. s/p aborted ant STEMI s/p DES to LAD 06/2003.  Marland Kitchen Hypertension   . Glucose intolerance (impaired glucose tolerance)   . Pneumonia   . Carotid artery stenosis     01/2011 - 40-59% bilateral stenosis  . Peripheral vascular disease     h/o LE angioplasty  . 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 Cardiomyopathy     a. EF 50-55% by echo 03/09/12 (was 30% by echo 02/24/12)  . Chronic renal insufficiency   . Paroxysmal atrial fibrillation   . Mitral regurgitation     Mild by echo 03/2012  . Hematemesis     12/2010 felt 2/2 Mallory Weiss tear - pt could not afford colonscopy/EGD at that time so Coumadin was deferred. Coumadin initiated 03/2012 without any evidence for bleeding.  Marland Kitchen Hyperlipidemia   . Inguinal hernia   . Diverticulosis 2001  . QT prolongation   . Bradycardia     Requiring discontinuation of use of BB/CCB  . Elevated LFTs     Shock liver 03/2012    PSH:   Past Surgical History  Procedure Laterality Date  . Popliteal artery angioplasty  01/07/07    s/p LEFT POPLITEAL ARTERY EXPLORATION  AND VEIN PATCH ANGIOPLASTY, PER DR. EARLY, SECONDARY TO ISCHEMIC LEFT FOOT RELATED TO LEFT POPLITEAL ARTERY INJURY  . Orif tibia & fibula fractures  01/11/07  OPEN TX OF UNICONDYLAR PLATEAU FRACTURE, IRRIGATION/DEBRIDEMENT OF OPEN FRACTURE INCLUDING BONE, REMOVAL OF EXTERNAL FIXATOR UNDER ANESTHESIA PER DR. MICHAEL HANDY  . Hernia repair    . Skin graft      Allergies: No Known Allergies  Medications:   Prior to Admission medications   Medication Sig Start Date End Date Taking? Authorizing Provider  nitroGLYCERIN (NITROSTAT) 0.4 MG SL tablet Place 1 tablet (0.4 mg total) under the tongue every 5 (five) minutes as needed for chest pain (Up to 3 doses). 03/16/12 03/16/13 Yes Dayna N Dunn, PA-C  potassium chloride SA (K-DUR,KLOR-CON) 20 MEQ tablet one tablet daily 05/02/12  Yes Scott T Kathlen Mody, PA-C  spironolactone (ALDACTONE) 25 MG tablet TAKE ONE TABLET BY MOUTH DAILY 10/31/12  Yes Lelon Perla, MD  warfarin (COUMADIN) 2.5 MG tablet Take 1 tablet (2.5 mg total) by mouth as directed. 11/02/12  Yes Lelon Perla, MD    Inpatient medications: . antiseptic oral rinse  15 mL Mouth Rinse QID  . aspirin  81 mg Per NG tube Daily  . chlorhexidine  15 mL Mouth Rinse BID  . clopidogrel  75 mg Per NG tube Q breakfast  . feeding supplement  30 mL Per Tube TID  . furosemide  40 mg Intravenous Q6H  . insulin aspart  2-6 Units Subcutaneous Q4H  . insulin glargine  20 Units Subcutaneous Q24H  . magnesium sulfate 1 - 4 g bolus IVPB  2 g Intravenous Once  . mupirocin ointment  1 application Nasal BID  . pantoprazole sodium  40 mg Per Tube QHS  . potassium chloride  40 mEq Per Tube TID    Discontinued Meds:   Medications Discontinued During This Encounter  Medication Reason  . heparin injection 5,000 Units   . EPINEPHrine (ADRENALIN) 1,000 mcg in dextrose 5 % 250 mL infusion   . norepinephrine (LEVOPHED) 4 mg in dextrose 5 % 250 mL infusion   . insulin aspart (novoLOG) injection 0-9 Units Change  in therapy  . potassium chloride 10 mEq in 100 mL IVPB   . pantoprazole (PROTONIX) injection 40 mg   . sodium phosphate 30 mmol in dextrose 5 % 250 mL infusion Formulary change  . potassium chloride 10 mEq in 50 mL *CENTRAL LINE* IVPB   . lidocaine (cardiac) IV  infusion 4 mg/mL   . procainamide (PRONESTYL) 1,000 mg in dextrose 5 % 250 mL IVPB Completed Course  . metoprolol succinate (TOPROL-XL) 50 MG 24 hr tablet Discontinued by provider  . artificial tears (LACRILUBE) ophthalmic ointment 1 application   . cisatracurium (NIMBEX) 200 mg in sodium chloride 0.9 % 200 mL infusion   . midazolam (VERSED) injection 2 mg   . midazolam (VERSED) 1 mg/mL in sodium chloride 0.9 % 50 mL infusion   . midazolam (VERSED) bolus via infusion 2 mg   . fentaNYL (SUBLIMAZE) injection 100 mcg   . fentaNYL (SUBLIMAZE) 10 mcg/mL in sodium chloride 0.9 % 250 mL infusion   . fentaNYL (SUBLIMAZE) bolus via infusion 50 mcg   . insulin regular (NOVOLIN R,HUMULIN R) 1 Units/mL in sodium chloride 0.9 % 100 mL infusion   . sodium bicarbonate 150 mEq in dextrose 5 % 1,000 mL infusion   . pantoprazole (PROTONIX) injection 40 mg Change in therapy  . acetaminophen (TYLENOL) tablet 650 mg     Social History:  reports that he quit smoking about 9 months ago. His smoking use included Cigarettes. He has a 15 pack-year smoking history. He does not have  any smokeless tobacco history on file. He reports that he uses illicit drugs (Marijuana) about twice per week. He reports that he does not drink alcohol.  Family History:   Family History  Problem Relation Age of Onset  . Heart disease Father     ALSO UNCLE DIED HAD CAD  . Brain cancer Mother   . Leukemia Mother   . Sickle cell anemia Mother   . Heart failure Father   . Sickle cell anemia Other     Review of systems not obtained due to patient factors. Weight change: -2.5 kg (-5 lb 8.2 oz)  Intake/Output Summary (Last 24 hours) at 01/04/13 1156 Last data filed at  01/04/13 1125  Gross per 24 hour  Intake   2396 ml  Output   5205 ml  Net  -2809 ml    General appearance: intubated, eyes open but nonresponsive/interactive Head: Normocephalic, without obvious abnormality, atraumatic Resp: occ rhonchi bilaterally Cardio: no rub GI: soft, non-tender; bowel sounds normal; no masses,  no organomegaly Extremities: extremities normal, atraumatic, no cyanosis or edema  Labs: Basic Metabolic Panel:  Recent Labs Lab 12/30/12 2312  12/31/12 0230  01/01/13 0505 01/01/13 0759 01/01/13 1200 01/01/13 1500 01/01/13 1900 01/02/13 0420 01/03/13 0432 01/04/13 0355  NA 142  < > 135  < > 136 137 138 138 140 141 138 139  K 3.9  < > 3.9  < > 3.6 3.6 3.6 4.4 5.1 5.2* 4.7 3.5  CL 102  < > 105  < > 100 101 101 100 102 102 97 95*  CO2 18*  --  15*  < > 23 25 25 24 26 24 27 29   GLUCOSE 307*  < > 240*  < > 155* 107* 72 86 92 107* 131* 161*  BUN 16  < > 22  < > 35* 35* 35* 36* 38* 42* 52* 56*  CREATININE 1.78*  < > 2.02*  < > 2.16* 2.20* 2.16* 2.22* 2.37* 2.55* 3.01* 2.71*  ALBUMIN 2.3*  --  2.9*  --   --  2.2*  --   --   --   --   --   --   CALCIUM 13.4*  --  9.9  < > 8.4 8.3* 8.1* 7.8* 7.7* 7.6* 7.4* 8.3*  PHOS  --   --  6.2*  --  1.3*  --   --   --   --  5.8* 7.6* 4.6  < > = values in this interval not displayed. Liver Function Tests:  Recent Labs Lab 12/30/12 2312 12/31/12 0230 01/01/13 0759  AST 135* 282* 264*  ALT 109* 154* 125*  ALKPHOS 135* 195* 149*  BILITOT 0.2* 0.4 0.8  PROT 5.2* 6.3 5.3*  ALBUMIN 2.3* 2.9* 2.2*   No results found for this basename: LIPASE, AMYLASE,  in the last 168 hours No results found for this basename: AMMONIA,  in the last 168 hours CBC:  Recent Labs Lab 12/30/12 2312  01/01/13 0505 01/02/13 0420 01/03/13 0432 01/04/13 0355  WBC 8.9  < > 6.3 12.5* 7.9 4.6  NEUTROABS 2.4  --   --   --   --   --   HGB 14.5  < > 17.6* 16.4 15.5 14.6  HCT 39.9  < > 44.2 42.5 41.7 38.8*  MCV 77.5*  < > 72.0* 73.3* 75.8* 73.8*   PLT 101*  < > 165 147* 134* 132*  < > = values in this interval not displayed. PT/INR: @labrcntip (inr:5) Cardiac Enzymes:  Recent Labs Lab 12/30/12 2312 12/31/12 0231 12/31/12 0700 12/31/12 1431 01/04/13 0917  TROPONINI 0.35* 2.78* 6.90* 19.30* 12.35*   CBG:  Recent Labs Lab 01/03/13 1618 01/03/13 2034 01/03/13 2332 01/04/13 0315 01/04/13 0719  GLUCAP 137* 140* 181* 134* 155*    Iron Studies: No results found for this basename: IRON, TIBC, TRANSFERRIN, FERRITIN,  in the last 168 hours  Xrays/Other Studies: US Renal Port  01/03/2013  *RADIOLOGY REPORT*  Clinical Data: Worsening renal function, evaluate for hydronephrosis  RENAL/URINARY TRACT ULTRASOUND COMPLETE  Comparison:  CT chest abdomen pelvis 01/07/2007  Findings:  Right Kidney:  Diffusely echogenic renal parenchyma.  The contour is normal and reniform.  No hydronephrosis or focal renal lesion. The kidney measures 10.6 cm in length.  Left Kidney:  Diffusely echogenic renal parenchyma.  The renal contour is normal.  No hydronephrosis or focal lesion.  The kidney measures 10.5 cm in length.  Bladder:  Foley catheter in the collapsed bladder.  Other:  Small 1.1 x 1.2 cm cyst in hepatic segment five of the liver adjacent to the gallbladder fossa.  Cholelithiasis is noted. There is also mild gallbladder wall thickening.  IMPRESSION:  1.  No hydronephrosis. 2.  Echogenic renal parenchyma consistent with underlying medical renal disease 3.  Cholelithiasis with gallbladder wall thickening. Query symptoms of chronic cholecystitis.   Original Report Authenticated By: Jacqulynn Cadet, M.D.    Dg Chest Port 1 View  01/04/2013  *RADIOLOGY REPORT*  Clinical Data: Endotracheal tube placement.  PORTABLE CHEST - 1 VIEW  Comparison: 01/03/2013.  Findings: Endotracheal tube is in satisfactory position. Nasogastric tube is followed into the stomach.  Heart size stable. There is diffuse bilateral air space disease, bibasilar dependent. No definite  pleural fluid.  IMPRESSION: Pulmonary edema, stable.   Original Report Authenticated By: Lorin Picket, M.D.    Dg Chest Port 1 View  01/03/2013  *RADIOLOGY REPORT*  Clinical Data: Evaluate endotracheal tube positioning  PORTABLE CHEST - 1 VIEW  Comparison: 01/02/2013; 12/30/2012; 11/24/2012  Findings: Grossly unchanged cardiac silhouette and mediastinal contours with atherosclerotic calcification of the thoracic aorta. Stable position of support apparatus including transcutaneous pacer's devices.  Pulmonary vasculature is slightly less distinct on the present examination.  Worsening perihilar medial basilar opacities, right greater than left.  No definite pleural effusion or pneumothorax.  Unchanged bones.  IMPRESSION: 1.  Stable positioning of support apparatus.  No pneumothorax. 2.  Suspected worsening pulmonary edema and perihilar/bibasilar atelectasis.   Original Report Authenticated By: Jake Seats, MD      Assessment/Plan:  1. Acute kidney injury/CKD- likely ischemic ATN in setting of hypotension, Cardiac arrest, further complicated by contrast nephropathy. 1.  Marked increase in UOP.  Likely diuretic phase of recovery.   2. Follow I's/O's and would decrease diuretics to prevent worsening of his ATN. Chest x-ray shows worsening of vascular congestion despite significant diuresis  ?asp PNA from arrest.  3. renal ultrasound without hydro and cortical thinning. 2. Cardiac arrest with prolonged CPR-per Cards and PCCM.  3. AMS- post prolonged CPR and arrest.  He is scheduled for MRI of brain later today.  4. Shock/hypotension-remains on norepinephrine/dopa. Prior history ischemic cardiomyopathy. No new suggestions 5. VDRF- per PCCM 6. Fever/pulmonary infiltrates- ? Aspiration PNA.  abx per PCCm 7. Dispo- ?await to see MRI of brain and possible anoxic brain injury. Dailin Sosnowski A 01/04/2013, 11:56 AM

## 2013-01-04 NOTE — Progress Notes (Signed)
Patient: Logan White Date of Encounter: 01/04/2013, 8:29 AM Admit date: 12/30/2012     Subjective  Not responsive to name on exam this morning, hypotensive requiring levophed upward titration (decreased MAP) and amiodarone.  Mildly febrile today at 99.1 from tmx 101.7 yesterday. Art line b 136/66 on exam this morning Not responding to name or any purposeful movements today.    Objective  Physical Exam: Vitals: BP 119/89  Pulse 83  Temp(Src) 99.1 F (37.3 C) (Core (Comment))  Resp 29  Ht 6\' 2"  (1.88 m)  Wt 184 lb 15.5 oz (83.9 kg)  BMI 23.74 kg/m2  SpO2 100% General: Well developed, AA male HEENT: Normocephalic, atraumatic, intubated Neck: Supple Lungs: Mechanical ventilation. Coarse breath sounds anterior fields Heart: Distant heart sounds. Irregularly irregular S1 S2 without murmur, rub or gallop appreciated  Abdomen/GU: Soft, non-distended, hypoactive bowel sounds, foley in place Extremities: BLE cool, diminished pedal pulses, SCDs in place Neuro: Following commands  Intake/Output:  Intake/Output Summary (Last 24 hours) at 01/04/13 0829 Last data filed at 01/04/13 0600  Gross per 24 hour  Intake 2405.2 ml  Output   5225 ml  Net -2819.8 ml    Inpatient Medications:  . antiseptic oral rinse  15 mL Mouth Rinse QID  . aspirin  81 mg Per NG tube Daily  . chlorhexidine  15 mL Mouth Rinse BID  . clopidogrel  75 mg Per NG tube Q breakfast  . feeding supplement  30 mL Per Tube TID  . insulin aspart  2-6 Units Subcutaneous Q4H  . insulin glargine  20 Units Subcutaneous Q24H  . mupirocin ointment  1 application Nasal BID  . pantoprazole sodium  40 mg Per Tube QHS   . sodium chloride 10 mL/hr at 01/02/13 0400  . sodium chloride 20 mL/hr at 01/01/13 1900  . amiodarone (NEXTERONE PREMIX) 360 mg/200 mL dextrose 30 mg/hr (01/04/13 0525)  . DOPamine 3 mcg/kg/min (01/02/13 2000)  . feeding supplement (OSMOLITE 1.2 CAL) 1,000 mL (01/02/13 1730)  . norepinephrine (LEVOPHED)  Adult infusion 15 mcg/min (01/04/13 0400)    Labs:  Recent Labs  01/03/13 0432 01/04/13 0355  NA 138 139  K 4.7 3.5  CL 97 95*  CO2 27 29  GLUCOSE 131* 161*  BUN 52* 56*  CREATININE 3.01* 2.71*  CALCIUM 7.4* 8.3*  MG 1.6 1.6  PHOS 7.6* 4.6     Recent Labs  01/03/13 0432 01/04/13 0355  WBC 7.9 4.6  HGB 15.5 14.6  HCT 41.7 38.8*  MCV 75.8* 73.8*  PLT 134* 132*    Radiology/Studies: Ct Head Wo Contrast  01/02/2013  *RADIOLOGY REPORT*  Clinical Data: Cardiac arrest, rule out anoxic brain injury.  On Coumadin  CT HEAD WITHOUT CONTRAST  Technique:  Contiguous axial images were obtained from the base of the skull through the vertex without contrast.  Comparison: CT head 01/07/2007  Findings: Ventricle size is normal.  Negative for acute infarct. Mild chronic microvascular ischemic changes in the white matter. No evidence of cortical edema.  Pearline Cables and white matter differentiation is maintained.  Negative for hemorrhage or mass.  No acute infarct or edema.  No shift to the midline structures.  Mucosal edema in the paranasal sinuses.  Calcification in the cavernous carotid is mild.  No skull lesion.  Right frontal scalp lipoma over the convexities unchanged from the prior study and appears benign.  IMPRESSION: No acute intracranial abnormality.  If the patient continues to have symptoms of anoxic injury, follow-up CT or MRI  is suggested.   Original Report Authenticated By: Carl Best, M.D.    Dg Chest Port 1 View  01/02/2013  *RADIOLOGY REPORT*  Clinical Data: Shortness of breath.  PORTABLE CHEST - 1 VIEW  Comparison: 01/01/2013.  Findings: Endotracheal tube tip 7.2 cm above the carina. Nasogastric tube tip gastric fundus.  Overlying leads noted.  Heart appears slightly prominent.  Calcified tortuous aorta.  Slight improved aeration right mid to lower lung zone.  Perihilar prominence may represent pulmonary vascular congestion/mild pulmonary edema.  Infectious infiltrate is a secondary  less likely consideration.  IMPRESSION: Slight improved aeration right mid to lower lung zone.  Please see above.   Original Report Authenticated By: Genia Del, M.D.     Telemetry: currently AF w/RVR; no further VT/VF    Assessment and Plan  Logan White is a 72 year old man with PMH of ischemic cardiomyopathy, Atrial fib on warfarin, CVA, DM2, CKD, HTN, and previous cardiac arrest a year ago (2/2 sever hyperkalemia) admitted with V Fib/V Tach arrest now s/p emergent cardiac cath and induced hypothermia.  1. Ventricular Fibrillation arrest:  Witnessed, s/p CPR prior to ED presentation, s/p induced hypothermia, telemetry Atrial fib, requirinf pressors for hypotension with goal MAP >85 and dopamine infusion to maintain HR >60.  On exam today HR 87 bpm with Arterial line bp reading 136/66 -cont supportive care  2. STEMI: cardiac enzymes elevated, s/p emergent cardiac cath -consider repeat troponin to follow down trend     Recent Labs Lab 12/30/12 2312 12/31/12 0231 12/31/12 0700 12/31/12 1431  TROPONINI 0.35* 2.78* 6.90* 19.30*   3. Ischemic Cardiomyopathy: Echo EF55%>>30-35% with RV dysfunction, pt with extensive CAD hx  4. Acute on chronic renal failure: creatinine looks to have peaked, Renal US w/o hydronephrosis, diffusely echogenic bilaterally c/w medical renal disease, UOP adequate -Renal following  Recent Labs Lab 01/01/13 1500 01/01/13 1900 01/02/13 0420 01/03/13 0432 01/04/13 0355  CREATININE 2.22* 2.37* 2.55* 3.01* 2.71*   6. Anoxic brain injury: unresponsive and intubated, MRI brain pending -Neurology following   Signed, Dorian Heckle , MD PGY-2  History and all data above reviewed.  Patient examined.  I agree with the findings as above. He is arousable and following commands.  The patient exam reveals COR:RRR  ,  Lungs: Decreased breath sounds  ,  Abd: Positive bowel sounds, no rebound no guarding, Ext No edema  .  All available labs, radiology testing,  previous records reviewed. Agree with documented assessment and plan. Continue supportive care.  Medical management.  We will need to consider an ICD in the future.    [name]  10:17 AM  01/04/2013

## 2013-01-05 ENCOUNTER — Inpatient Hospital Stay (HOSPITAL_COMMUNITY): Payer: Medicare Other

## 2013-01-05 DIAGNOSIS — I4891 Unspecified atrial fibrillation: Secondary | ICD-10-CM

## 2013-01-05 DIAGNOSIS — I2589 Other forms of chronic ischemic heart disease: Secondary | ICD-10-CM

## 2013-01-05 DIAGNOSIS — E119 Type 2 diabetes mellitus without complications: Secondary | ICD-10-CM

## 2013-01-05 LAB — CBC
MCV: 73.3 fL — ABNORMAL LOW (ref 78.0–100.0)
Platelets: 139 10*3/uL — ABNORMAL LOW (ref 150–400)
RBC: 5.06 MIL/uL (ref 4.22–5.81)
RDW: 14.1 % (ref 11.5–15.5)
WBC: 6.8 10*3/uL (ref 4.0–10.5)

## 2013-01-05 LAB — MAGNESIUM: Magnesium: 1.9 mg/dL (ref 1.5–2.5)

## 2013-01-05 LAB — POCT I-STAT 3, ART BLOOD GAS (G3+)
Acid-Base Excess: 10 mmol/L — ABNORMAL HIGH (ref 0.0–2.0)
Patient temperature: 100
pH, Arterial: 7.417 (ref 7.350–7.450)

## 2013-01-05 LAB — BASIC METABOLIC PANEL
CO2: 32 mEq/L (ref 19–32)
Chloride: 93 mEq/L — ABNORMAL LOW (ref 96–112)
Creatinine, Ser: 2.26 mg/dL — ABNORMAL HIGH (ref 0.50–1.35)
GFR calc Af Amer: 32 mL/min — ABNORMAL LOW (ref >90)
Sodium: 137 mEq/L (ref 135–145)

## 2013-01-05 LAB — BLOOD GAS, ARTERIAL
Bicarbonate: 32.7 mEq/L — ABNORMAL HIGH (ref 20.0–24.0)
O2 Saturation: 96.6 %
PEEP: 5 cmH2O
TCO2: 34.1 mmol/L (ref 0–100)
pH, Arterial: 7.468 — ABNORMAL HIGH (ref 7.350–7.450)
pO2, Arterial: 85.9 mmHg (ref 80.0–100.0)

## 2013-01-05 LAB — GLUCOSE, CAPILLARY
Glucose-Capillary: 114 mg/dL — ABNORMAL HIGH (ref 70–99)
Glucose-Capillary: 151 mg/dL — ABNORMAL HIGH (ref 70–99)

## 2013-01-05 LAB — PHOSPHORUS: Phosphorus: 3.8 mg/dL (ref 2.3–4.6)

## 2013-01-05 MED ORDER — HEPARIN BOLUS VIA INFUSION
4000.0000 [IU] | Freq: Once | INTRAVENOUS | Status: AC
  Administered 2013-01-05: 4000 [IU] via INTRAVENOUS
  Filled 2013-01-05: qty 4000

## 2013-01-05 MED ORDER — MIDAZOLAM HCL 2 MG/2ML IJ SOLN
1.0000 mg | INTRAMUSCULAR | Status: DC | PRN
  Administered 2013-01-05: 1 mg via INTRAVENOUS
  Filled 2013-01-05: qty 2

## 2013-01-05 MED ORDER — MIDAZOLAM HCL 2 MG/2ML IJ SOLN
INTRAMUSCULAR | Status: AC
  Administered 2013-01-05: 2 mg via INTRAVENOUS
  Filled 2013-01-05: qty 2

## 2013-01-05 MED ORDER — FENTANYL CITRATE 0.05 MG/ML IJ SOLN
25.0000 ug | INTRAMUSCULAR | Status: DC | PRN
  Administered 2013-01-05: 25 ug via INTRAVENOUS
  Filled 2013-01-05: qty 2

## 2013-01-05 MED ORDER — POTASSIUM CHLORIDE 10 MEQ/50ML IV SOLN
10.0000 meq | INTRAVENOUS | Status: AC
  Administered 2013-01-05 (×2): 10 meq via INTRAVENOUS
  Filled 2013-01-05 (×2): qty 50

## 2013-01-05 MED ORDER — FENTANYL CITRATE 0.05 MG/ML IJ SOLN
INTRAMUSCULAR | Status: AC
  Administered 2013-01-05: 50 ug via INTRAVENOUS
  Filled 2013-01-05: qty 2

## 2013-01-05 MED ORDER — HEPARIN (PORCINE) IN NACL 100-0.45 UNIT/ML-% IJ SOLN
1250.0000 [IU]/h | INTRAMUSCULAR | Status: DC
  Administered 2013-01-05: 1100 [IU]/h via INTRAVENOUS
  Filled 2013-01-05 (×5): qty 250

## 2013-01-05 NOTE — Progress Notes (Signed)
[facility] ICU Electrolyte Replacement Protocol  Patient Name: Logan White DOB: March 08, 1941 MRN: [id number]  Date of Service  01/05/2013 K+ 3.3  Pt not suitable for electrolyte protocol MD notified  HPI/Events of Note    Recent Labs Lab 01/01/13 0505  01/01/13 1900 01/02/13 0420 01/03/13 0432 01/04/13 0355 01/05/13 0419  NA 136  < > 140 141 138 139 137  K 3.6  < > 5.1 5.2* 4.7 3.5 3.3*  CL 100  < > 102 102 97 95* 93*  CO2 23  < > 26 24 27 29  32  GLUCOSE 155*  < > 92 107* 131* 161* 132*  BUN 35*  < > 38* 42* 52* 56* 58*  CREATININE 2.16*  < > 2.37* 2.55* 3.01* 2.71* 2.26*  CALCIUM 8.4  < > 7.7* 7.6* 7.4* 8.3* 9.2  MG 2.0  --   --  1.7 1.6 1.6 1.9  PHOS 1.3*  --   --  5.8* 7.6* 4.6 3.8  < > = values in this interval not displayed.  Estimated Creatinine Clearance: 35.4 ml/min (by C-G formula based on Cr of 2.26).  Intake/Output     05/02 0701 - 05/03 0700   I.V. (mL/kg) 947 (11.7)   NG/GT 1045   IV Piggyback 50   Total Intake(mL/kg) 2042 (25.1)   Urine (mL/kg/hr) 3705 (1.9)   Total Output 3705   Net -1663.1        - I/O DETAILED x24h    Total I/O In: 906.6 [I.V.:411.6; NG/GT:495] Out: 1450 [Urine:1450] - I/O THIS SHIFT    ASSESSMENT   eICURN Interventions     ASSESSMENT: MAJOR ELECTROLYTE    Lorene Dy 01/05/2013, 5:43 AM

## 2013-01-05 NOTE — Progress Notes (Signed)
ANTICOAGULATION CONSULT NOTE - Initial Consult  Pharmacy Consult for heparin  Indication: atrial fibrillation  No Known Allergies  Patient Measurements: Height: 6\' 2"  (188 cm) Weight: 179 lb 0.2 oz (81.2 kg) IBW/kg (Calculated) : 82.2 Heparin Dosing Weight: 80kg  Vital Signs: Temp: 100.6 F (38.1 C) (05/03 1400) BP: 165/54 mmHg (05/03 0747) Pulse Rate: 101 (05/03 0747)  Labs:  Recent Labs  01/03/13 0432 01/04/13 0355 01/04/13 0917 01/05/13 0419  HGB 15.5 14.6  --  14.2  HCT 41.7 38.8*  --  37.1*  PLT 134* 132*  --  139*  CREATININE 3.01* 2.71*  --  2.26*  TROPONINI  --   --  12.35*  --     Estimated Creatinine Clearance: 35.4 ml/min (by C-G formula based on Cr of 2.26).   Medical History: Past Medical History  Diagnosis Date  . Inguinal hernia without mention of obstruction or gangrene, unilateral or unspecified, (not specified as recurrent)   . 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 Peripheral vascular disease, unspecified   . Carotid stenosis     dopplers 1/11: 60-79% bilat.  . Hyperlipidemia     mixed  . Hypertension     a. echo 4/12: EF 50%, asymmetric septal hypertrophy, no SAM or LVOT gradient, LAE, PASP 35  . Coronary artery disease     a. s/p aborted ant STEMI tx with Cypher DES to LAD 10/04 (residual at cath: D1 50%, CFX 40% and multiple dist 70%, EF 55%);   b. myoview 3/10: Ef 47%, infero-apical isch, LOW RISK - med Tx recommended  . CKD (chronic kidney disease)   . Paroxysmal atrial fibrillation 12/2010  . Diverticulosis     2001  . Diabetes mellitus   . H/O: CVA (cardiovascular accident)   . Coronary artery disease     a. s/p aborted ant STEMI s/p DES to LAD 06/2003.  Marland Kitchen Hypertension   . Glucose intolerance (impaired glucose tolerance)   . Pneumonia   . Carotid artery stenosis     01/2011 - 40-59% bilateral stenosis  . Peripheral vascular disease     h/o LE angioplasty  . Cardiac arrest - ventricular  fibrillation     03/2012 in setting of hypokalemia (prolonged hosp with VDRF, tracheobronchitis, ARF, shock liver, PAF, AMS felt secondary to post-anoxic encephalopathy/shock)  . Cardiomyopathy     a. EF 50-55% by echo 03/09/12 (was 30% by echo 02/24/12)  . Chronic renal insufficiency   . Paroxysmal atrial fibrillation   . Mitral regurgitation     Mild by echo 03/2012  . Hematemesis     12/2010 felt 2/2 Mallory Weiss tear - pt could not afford colonscopy/EGD at that time so Coumadin was deferred. Coumadin initiated 03/2012 without any evidence for bleeding.  Marland Kitchen Hyperlipidemia   . Inguinal hernia   . Diverticulosis 2001  . QT prolongation   . Bradycardia     Requiring discontinuation of use of BB/CCB  . Elevated LFTs     Shock liver 03/2012     Assessment: 72 year old male s/p vfib arrest hypothermia protocol. History of afib on coumadin pta, orders to start heparin. Patient continues in afib. CBC has remained stable.  Goal of Therapy:  Heparin level 0.3-0.7 units/ml Monitor platelets by anticoagulation protocol: Yes   Plan:  Give 4000 units bolus x 1 Start heparin infusion at 1100 units/hr Check anti-Xa level in 6 hours and daily while on heparin Continue to monitor H&H and platelets  Georgina Peer  01/05/2013,3:38 PM

## 2013-01-05 NOTE — Progress Notes (Signed)
Awake, following comends and trying to climb out of bed   Intermittent sedation ordered

## 2013-01-05 NOTE — Progress Notes (Signed)
PULMONARY  / CRITICAL CARE MEDICINE  Name: Logan White MRN: [id number] DOB: 01/23/1941    ADMISSION DATE:  12/30/2012  REFERRING MD :  EDP PRIMARY SERVICE: PCCM  CHIEF COMPLAINT:  Cardiac arrest  BRIEF PATIENT DESCRIPTION:  72 yo with ischemic cardiomyopathy admitted for hypothermia protocol after VF/VT arrest with possible STEMI.  SIGNIFICANT EVENTS / STUDIES:  4/27 VT/VT cardiac arrest, hypothermia protocol initiated 4/28 Cardiac cath - no culprit new lesions 5/1   EEG >>> Severe encephalopathy 5/2   Brain MRI >>> No acute infarct, no edema  LINES / TUBES: Left femoral  TLC 4/27 >>> 5/3 ETT 4/27>>> OGT 4/27>> Foley 4/27>>>   CULTURES:  ANTIBIOTICS:  SUBJECTIVE:  Agitated overnight, responds to some commands.  VITAL SIGNS: Temp:  [97.9 F (36.6 C)-100.4 F (38 C)] 100 F (37.8 C) (05/03 0747) Pulse Rate:  [101] 101 (05/03 0747) Resp:  [14-38] 22 (05/03 0747) BP: (165)/(54) 165/54 mmHg (05/03 0747) SpO2:  [96 %-100 %] 99 % (05/03 0747) FiO2 (%):  [40 %] 40 % (05/03 0747) Weight:  [81.2 kg (179 lb 0.2 oz)] 81.2 kg (179 lb 0.2 oz) (05/03 0438) HEMODYNAMICS:   VENTILATOR SETTINGS: Vent Mode:  [-] CPAP;PSV FiO2 (%):  [40 %] 40 % Set Rate:  [12 bmp-14 bmp] 14 bmp Vt Set:  [550 mL] 550 mL PEEP:  [5 cmH20] 5 cmH20 Pressure Support:  [5 cmH20-10 cmH20] 5 cmH20 Plateau Pressure:  [16 cmH20-18 cmH20] 17 cmH20  INTAKE / OUTPUT: Intake/Output     05/02 0701 - 05/03 0700 05/03 0701 - 05/04 0700   I.V. (mL/kg) 1025.1 (12.6)    NG/GT 1155    IV Piggyback 50    Total Intake(mL/kg) 2230.1 (27.5)    Urine (mL/kg/hr) 4055 (2.1)    Total Output 4055     Net -1825           PHYSICAL EXAMINATION: General: No distress, synchronous Neuro: Nonverbal, follows some commands, no focal deficits HEENT: OGT/OETT Cardiovascular:  Irregular / tachycardic Lungs:  Bibasilar crackles, bilateral air entry Abdomen:  Soft, no distended. Musculoskeletal:  Moves all  extremities Skin:  Intact.  LABS:  Recent Labs Lab 12/30/12 2312  12/31/12 0230  12/31/12 0700  12/31/12 1210 12/31/12 1431  12/31/12 1700  12/31/12 2115  01/01/13 0500  01/01/13 0759  01/03/13 0432  01/03/13 2000 01/04/13 0310 01/04/13 0355 01/04/13 0430 01/04/13 0851 01/04/13 0917 01/05/13 0419 01/05/13 0500 01/05/13 0808  HGB 14.5  < > 16.7  --   --   < >  --   --   --   --   --   --   --   --   < >  --   < > 15.5  --   --   --  14.6  --   --   --  14.2  --   --   WBC 8.9  --  14.2*  --   --   --   --   --   --   --   --   --   --   --   < >  --   < > 7.9  --   --   --  4.6  --   --   --  6.8  --   --   PLT 101*  --  207  --   --   --   --   --   --   --   --   --   --   --   < >  --   < >  134*  --   --   --  132*  --   --   --  139*  --   --   NA 142  < > 135  < > 138  < > 136  --   --  138  --  139  < >  --   < > 137  < > 138  --   --   --  139  --   --   --  137  --   --   K 3.9  < > 3.9  < > 2.7*  < > 2.8*  --   --  2.9*  --  3.4*  < >  --   < > 3.6  < > 4.7  --   --   --  3.5  --   --   --  3.3*  --   --   CL 102  < > 105  < > 103  < > 105  --   --  106  --  105  < >  --   < > 101  < > 97  --   --   --  95*  --   --   --  93*  --   --   CO2 18*  --  15*  < > 16*  --  16*  --   --  19  --  20  < >  --   < > 25  < > 27  --   --   --  29  --   --   --  32  --   --   GLUCOSE 307*  < > 240*  < > 295*  < > 217*  --   --  135*  --  139*  < >  --   < > 107*  < > 131*  --   --   --  161*  --   --   --  132*  --   --   BUN 16  < > 22  < > 25*  < > 28*  --   --  32*  --  32*  < >  --   < > 35*  < > 52*  --   --   --  56*  --   --   --  58*  --   --   CREATININE 1.78*  < > 2.02*  < > 2.16*  < > 2.17*  --   --  2.25*  --  2.18*  < >  --   < > 2.20*  < > 3.01*  --   --   --  2.71*  --   --   --  2.26*  --   --   CALCIUM 13.4*  --  9.9  < > 9.8  --  9.5  --   --  9.2  --  8.8  < >  --   < > 8.3*  < > 7.4*  --   --   --  8.3*  --   --   --  9.2  --   --   MG  --   --  3.4*  --   --   --    --   --   --   --   --   --   --   --   < >  --   < >  1.6  --   --   --  1.6  --   --   --  1.9  --   --   PHOS  --   --  6.2*  --   --   --   --   --   --   --   --   --   --   --   < >  --   < > 7.6*  --   --   --  4.6  --   --   --  3.8  --   --   AST 135*  --  282*  --   --   --   --   --   --   --   --   --   --   --   --  264*  --   --   --   --   --   --   --   --   --   --   --   --   ALT 109*  --  154*  --   --   --   --   --   --   --   --   --   --   --   --  125*  --   --   --   --   --   --   --   --   --   --   --   --   ALKPHOS 135*  --  195*  --   --   --   --   --   --   --   --   --   --   --   --  149*  --   --   --   --   --   --   --   --   --   --   --   --   BILITOT 0.2*  --  0.4  --   --   --   --   --   --   --   --   --   --   --   --  0.8  --   --   --   --   --   --   --   --   --   --   --   --   PROT 5.2*  --  6.3  --   --   --   --   --   --   --   --   --   --   --   --  5.3*  --   --   --   --   --   --   --   --   --   --   --   --   ALBUMIN 2.3*  --  2.9*  --   --   --   --   --   --   --   --   --   --   --   --  2.2*  --   --   --   --   --   --   --   --   --   --   --   --   APTT 54*  --  163*  --   --   --  75*  --   --   --   --   --   --   --   --   --   --   --   --   --   --   --   --   --   --   --   --   --   INR 2.72*  --  9.05*  --   --   --  3.64*  --   --  3.62*  --  3.75*  --  2.86*  --   --   --   --   --   --   --   --   --   --   --   --   --   --   LATICACIDVEN  --   --   --   --   --   < >  --   --   < >  --   < >  --   --   --   --  3.2*  < >  --   < > 2.3*  --   --  2.2 1.9  --   --   --   --   TROPONINI 0.35*  --   --   < > 6.90*  --   --  19.30*  --   --   --   --   --   --   --   --   --   --   --   --   --   --   --   --  12.35*  --   --   --   PHART  --   < >  --   --   --   < >  --   --   --   --   --   --   < >  --   < >  --   < >  --   --   --  7.412  --   --   --   --   --  7.468* 7.417  PCO2ART  --   < >  --   --   --   < >  --   --    --   --   --   --   < >  --   < >  --   < >  --   --   --  47.3*  --   --   --   --   --  45.8* 56.7*  PO2ART  --   < >  --   --   --   < >  --   --   --   --   --   --   < >  --   < >  --   < >  --   --   --  64.0*  --   --   --   --   --  85.9 89.0  < > = values in this interval not displayed.  Recent Labs Lab 01/04/13 1647 01/04/13 2004 01/04/13 2315 01/05/13 0358 01/05/13 0722  GLUCAP 111* 119* 129* 114* 151*   CXR: 5/3 >>> Improving bilateral airspace disease, ETT in place  ASSESSMENT / PLAN:  PULMONARY A: Acute respiratory failure in setting of cardiac arrest and acute pulmonary edema. P:  SBT with intent to extubate as improved mental status  CARDIOVASCULAR A: VF/VT arrest. STEMI.  Cardiogenic shock, resolving. P:  Cardiology following Aspirin / Plavix Statin contraindicated - elevated LFTs  Beta blocker  / ACEI contraindicated -  hypotension / AKI D/c Levophed / Dopamine D/c femoral line Amiodarone  RENAL A:  CKD.  Acute on chronic renal failure. P:   Renal following Trend BMP Lasix Replete K PRN  GASTROINTESTINAL A:  No active issues P:   NPO SLP eval after extubation D/c Protonix as extubated and off pressors D/c TF  HEM: No active issues. P:  Trend CBC  INFECTIOUS A:  No active issues. P:   No intervention required  ENDOCRINE A:  Hyperglycemia, suspected DM. P:   SSI D/c Lantus as NPO  NEUROLOGIC A:  Acute encephalopathy, improving.  No edema  / no acute stroke on MRI. P:   Neuro following D/c sedation  I have personally obtained a history, examined the patient, evaluated laboratory and imaging results, formulated the assessment and plan and placed orders.  CRITICAL CARE: The patient is critically ill with multiple organ systems failure and requires high complexity decision making for assessment and support, frequent evaluation and titration of therapies, application of advanced monitoring technologies and extensive interpretation  of multiple databases. Critical Care Time devoted to patient care services described in this note is 35 minutes.   Doree Fudge, M.D. Pulmonary and [place] Pager: [phone]  01/05/2013, 8:17 AM

## 2013-01-05 NOTE — Progress Notes (Signed)
SLP Cancellation Note  Patient Details Name: [name] MRN: [id number] DOB: March 17, 1941   Cancelled treatment:       Reason Eval/Treat Not Completed: Patient not medically ready.  Patient extubated this am after prolonged intubation. Per RN, patient remains largely aphonic, severely hoarse. Will f/u 5/4.  [place], [place] [phone]    Aurel Nguyen Meryl 01/05/2013, 2:35 PM

## 2013-01-05 NOTE — Progress Notes (Signed)
Patient ID: Logan White, male   DOB: Dec 29, 1940, 72 y.o.   MRN: [id number] S:extubated, lethargic but follows simple commands O:BP 165/54  Pulse 101  Temp(Src) 100.2 F (37.9 C) (Core (Comment))  Resp 19  Ht 6\' 2"  (1.88 m)  Wt 81.2 kg (179 lb 0.2 oz)  BMI 22.97 kg/m2  SpO2 97%  Intake/Output Summary (Last 24 hours) at 01/05/13 0940 Last data filed at 01/05/13 0800  Gross per 24 hour  Intake 2203.48 ml  Output   3955 ml  Net -1751.52 ml   Intake/Output: I/O last 3 completed shifts: In: 3658.7 [I.V.:1738.7; NG/GT:1870; IV Piggyback:50] Out: [id number] [Urine:8055]  Intake/Output this shift:  Total I/O In: 92.4 [I.V.:37.4; NG/GT:55] Out: 150 [Urine:150] Weight change: -2.7 kg (-5 lb 15.2 oz) [id number], chronically ill appearing AAM CVS:no rub Resp:scattered rhonchi [id number] Ext:no edema   Recent Labs Lab 12/30/12 2312  12/31/12 0230  01/01/13 0505 01/01/13 0759 01/01/13 1200 01/01/13 1500 01/01/13 1900 01/02/13 0420 01/03/13 0432 01/04/13 0355 01/05/13 0419  NA 142  < > 135  < > 136 137 138 138 140 141 138 139 137  K 3.9  < > 3.9  < > 3.6 3.6 3.6 4.4 5.1 5.2* 4.7 3.5 3.3*  CL 102  < > 105  < > 100 101 101 100 102 102 97 95* 93*  CO2 18*  --  15*  < > 23 25 25 24 26 24 27 29  32  GLUCOSE 307*  < > 240*  < > 155* 107* 72 86 92 107* 131* 161* 132*  BUN 16  < > 22  < > 35* 35* 35* 36* 38* 42* 52* 56* 58*  CREATININE 1.78*  < > 2.02*  < > 2.16* 2.20* 2.16* 2.22* 2.37* 2.55* 3.01* 2.71* 2.26*  ALBUMIN 2.3*  --  2.9*  --   --  2.2*  --   --   --   --   --   --   --   CALCIUM 13.4*  --  9.9  < > 8.4 8.3* 8.1* 7.8* 7.7* 7.6* 7.4* 8.3* 9.2  PHOS  --   --  6.2*  --  1.3*  --   --   --   --  5.8* 7.6* 4.6 3.8  AST 135*  --  282*  --   --  264*  --   --   --   --   --   --   --   ALT 109*  --  154*  --   --  125*  --   --   --   --   --   --   --   < > = values in this interval not displayed. Liver Function Tests:  Recent Labs Lab 12/30/12 2312 12/31/12 0230  01/01/13 0759  AST 135* 282* 264*  ALT 109* 154* 125*  ALKPHOS 135* 195* 149*  BILITOT 0.2* 0.4 0.8  PROT 5.2* 6.3 5.3*  ALBUMIN 2.3* 2.9* 2.2*   No results found for this basename: LIPASE, AMYLASE,  in the last 168 hours No results found for this basename: AMMONIA,  in the last 168 hours CBC:  Recent Labs Lab 12/30/12 2312  01/01/13 0505 01/02/13 0420 01/03/13 0432 01/04/13 0355 01/05/13 0419  WBC 8.9  < > 6.3 12.5* 7.9 4.6 6.8  NEUTROABS 2.4  --   --   --   --   --   --   HGB 14.5  < > 17.6* 16.4  15.5 14.6 14.2  HCT 39.9  < > 44.2 42.5 41.7 38.8* 37.1*  MCV 77.5*  < > 72.0* 73.3* 75.8* 73.8* 73.3*  PLT 101*  < > 165 147* 134* 132* 139*  < > = values in this interval not displayed. Cardiac Enzymes:  Recent Labs Lab 12/30/12 2312 12/31/12 0231 12/31/12 0700 12/31/12 1431 01/04/13 0917  TROPONINI 0.35* 2.78* 6.90* 19.30* 12.35*   CBG:  Recent Labs Lab 01/04/13 1647 01/04/13 2004 01/04/13 2315 01/05/13 0358 01/05/13 0722  GLUCAP 111* 119* 129* 114* 151*    Iron Studies: No results found for this basename: IRON, TIBC, TRANSFERRIN, FERRITIN,  in the last 72 hours Studies/Results: Mr Brain Wo Contrast  01/04/2013  *RADIOLOGY REPORT*  Clinical Data: Cardiac arrest.  Evaluate for anoxic injury.  MRI HEAD WITHOUT CONTRAST  Technique:  Multiplanar, multiecho pulse sequences of the brain and surrounding structures were obtained according to standard protocol without intravenous contrast.  Comparison: 01/02/2013 CT.  No comparison MR.  Findings: No acute infarct.  No diffuse brain edema to suggest diffuse anoxic injury.  Mild small vessel disease type changes.  Global atrophy without hydrocephalus.  Major intracranial vascular structures are patent.  No intracranial hemorrhage.  Lipoma scalp convexity incidentally noted.  Mild mucosal thickening mastoid air cells and paranasal sinuses. Pooled secretions within the upper pharynx.  IMPRESSION:  No acute infarct.  No diffuse  brain edema to suggest diffuse anoxic injury.   Original Report Authenticated By: Genia Del, M.D.    US Renal Port  01/03/2013  *RADIOLOGY REPORT*  Clinical Data: Worsening renal function, evaluate for hydronephrosis  RENAL/URINARY TRACT ULTRASOUND COMPLETE  Comparison:  CT chest abdomen pelvis 01/07/2007  Findings:  Right Kidney:  Diffusely echogenic renal parenchyma.  The contour is normal and reniform.  No hydronephrosis or focal renal lesion. The kidney measures 10.6 cm in length.  Left Kidney:  Diffusely echogenic renal parenchyma.  The renal contour is normal.  No hydronephrosis or focal lesion.  The kidney measures 10.5 cm in length.  Bladder:  Foley catheter in the collapsed bladder.  Other:  Small 1.1 x 1.2 cm cyst in hepatic segment five of the liver adjacent to the gallbladder fossa.  Cholelithiasis is noted. There is also mild gallbladder wall thickening.  IMPRESSION:  1.  No hydronephrosis. 2.  Echogenic renal parenchyma consistent with underlying medical renal disease 3.  Cholelithiasis with gallbladder wall thickening. Query symptoms of chronic cholecystitis.   Original Report Authenticated By: Jacqulynn Cadet, M.D.    Dg Chest Port 1 View  01/05/2013  *RADIOLOGY REPORT*  Clinical Data: Intubated, respiratory failure  PORTABLE CHEST - 1 VIEW  Comparison: 01/04/2013  Findings: Slight retraction of endotracheal tube now 7.8 cm above the carina.  NG tube in the stomach fundus as before.  Mild cardiac enlargement with slight improvement in perihilar and basilar edema pattern.  Minimal residual basilar atelectasis.  No enlarging effusion or pneumothorax.  IMPRESSION: Slight retraction of the endotracheal tube as above.  Improving edema pattern with residual atelectasis   Original Report Authenticated By: Jerilynn Mages. Annamaria Boots, M.D.    Dg Chest Port 1 View  01/04/2013  *RADIOLOGY REPORT*  Clinical Data: Endotracheal tube placement.  PORTABLE CHEST - 1 VIEW  Comparison: 01/03/2013.  Findings: Endotracheal tube  is in satisfactory position. Nasogastric tube is followed into the stomach.  Heart size stable. There is diffuse bilateral air space disease, bibasilar dependent. No definite pleural fluid.  IMPRESSION: Pulmonary edema, stable.   Original Report Authenticated By: Rip Harbour  Rosario Jacks, M.D.    . antiseptic oral rinse  15 mL Mouth Rinse QID  . aspirin  81 mg Per NG tube Daily  . chlorhexidine  15 mL Mouth Rinse BID  . clopidogrel  75 mg Per NG tube Q breakfast  . furosemide  40 mg Intravenous Q12H  . potassium chloride  10 mEq Intravenous Q1 Hr x 2    BMET    Component Value Date/Time   NA 137 01/05/2013 0419   K 3.3* 01/05/2013 0419   CL 93* 01/05/2013 0419   CO2 32 01/05/2013 0419   GLUCOSE 132* 01/05/2013 0419   BUN 58* 01/05/2013 0419   CREATININE 2.26* 01/05/2013 0419   CALCIUM 9.2 01/05/2013 0419   GFRNONAA 28* 01/05/2013 0419   GFRAA 32* 01/05/2013 0419   CBC    Component Value Date/Time   WBC 6.8 01/05/2013 0419   RBC 5.06 01/05/2013 0419   HGB 14.2 01/05/2013 0419   HCT 37.1* 01/05/2013 0419   PLT 139* 01/05/2013 0419   MCV 73.3* 01/05/2013 0419   MCH 28.1 01/05/2013 0419   MCHC 38.3* 01/05/2013 0419   RDW 14.1 01/05/2013 0419   LYMPHSABS 5.9* 12/30/2012 2312   MONOABS 0.5 12/30/2012 2312   EOSABS 0.2 12/30/2012 2312   BASOSABS 0.0 12/30/2012 2312     Assessment/Plan:  1. Acute kidney injury/CKD- likely ischemic ATN in setting of hypotension, Cardiac arrest, further complicated by contrast nephropathy.  1. Marked increase in UOP and dropping Scr. Likely diuretic phase of recovery.  2. Follow I's/O's and would decrease diuretics to prevent worsening of his ATN.   3. renal ultrasound without hydro but did show cortical thinning. 4. 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 Cardiac arrest with prolonged CPR-per Cards and PCCM.  3. AMS- post prolonged CPR and arrest. He is extubated and lethargic but does follow simple commands.  MRI of brain did not show any cerebral edema to support anoxic injury.   Will need PT/OT and swallowing eval.   4. Shock/hypotension-off of pressors. Prior history ischemic cardiomyopathy.  5. VDRF- s/p extubation.  Plan per PCCM 6. Fever/pulmonary infiltrates- ? Aspiration PNA. abx per PCCm 7. Dispo- will likely need PT/OT or SNF depending upon his recovery and family wishes. 8.   Dalesha Stanback A

## 2013-01-05 NOTE — Progress Notes (Signed)
ANTICOAGULATION CONSULT NOTE  Pharmacy Consult for heparin  Indication: atrial fibrillation  No Known Allergies  Patient Measurements: Height: 6\' 2"  (188 cm) Weight: 179 lb 0.2 oz (81.2 kg) IBW/kg (Calculated) : 82.2 Heparin Dosing Weight: 80kg  Vital Signs: Temp: 99.9 F (37.7 C) (05/03 2100) BP: 92/56 mmHg (05/03 2100)  Labs:  Recent Labs  01/03/13 0432 01/04/13 0355 01/04/13 0917 01/05/13 0419 01/05/13 2132  HGB 15.5 14.6  --  14.2  --   HCT 41.7 38.8*  --  37.1*  --   PLT 134* 132*  --  139*  --   HEPARINUNFRC  --   --   --   --  0.26*  CREATININE 3.01* 2.71*  --  2.26*  --   TROPONINI  --   --  12.35*  --   --     Estimated Creatinine Clearance: 35.4 ml/min (by C-G formula based on Cr of 2.26).  Assessment: 72 year old male with h/o afib for heparin.  Goal of Therapy:  Heparin level 0.3-0.7 units/ml Monitor platelets by anticoagulation protocol: Yes   Plan:  Increase Heparin 1250 units/hr Follow-up am labs.  Caryl Pina 01/05/2013,10:36 PM

## 2013-01-05 NOTE — Procedures (Signed)
Extubation Procedure Note  Patient Details:   Name: Logan White DOB: 04/28/1941 MRN: [id number]   Airway Documentation:     Evaluation  O2 sats: stable throughout Complications: No apparent complications Patient did tolerate procedure well. Bilateral Breath Sounds: Rhonchi;Diminished Suctioning: Airway;Oral Yes  Mcneil Sober 01/05/2013, 8:18 AM

## 2013-01-05 NOTE — Progress Notes (Signed)
Patient: Logan White Date of Encounter: 01/05/2013, 8:25 AM Admit date: 12/30/2012     Subjective  Pt became more responsive throughout the day yesterday.  Tried to climb out of bed while intubated.  Extubated this morning. Febrile T 100.   Objective  Physical Exam: Vitals: BP 165/54  Pulse 101  Temp(Src) 100 F (37.8 C) (Core (Comment))  Resp 22  Ht 6\' 2"  (1.88 m)  Wt 179 lb 0.2 oz (81.2 kg)  BMI 22.97 kg/m2  SpO2 97% General: Well developed, AA male HEENT: Normocephalic, atraumatic Neck: Supple Lungs: Coarse breath sounds anterior fields Heart: Distant heart sounds. Irregularly irregular S1 S2 without murmur, rub or gallop appreciated  Abdomen/GU: Soft, non-distended, hypoactive bowel sounds, foley in place Extremities: BLE cool, diminished pedal pulses, SCDs in place Neuro: Following commands  Intake/Output:  Intake/Output Summary (Last 24 hours) at 01/05/13 0825 Last data filed at 01/05/13 0600  Gross per 24 hour  Intake 2124.25 ml  Output   3805 ml  Net -1680.75 ml    Inpatient Medications:  . antiseptic oral rinse  15 mL Mouth Rinse QID  . aspirin  81 mg Per NG tube Daily  . chlorhexidine  15 mL Mouth Rinse BID  . clopidogrel  75 mg Per NG tube Q breakfast  . feeding supplement  30 mL Per Tube TID  . furosemide  40 mg Intravenous Q12H  . insulin aspart  2-6 Units Subcutaneous Q4H  . insulin glargine  20 Units Subcutaneous Q24H  . pantoprazole sodium  40 mg Per Tube QHS   . sodium chloride 20 mL/hr at 01/05/13 0401  . sodium chloride 20 mL/hr at 01/01/13 1900  . amiodarone (NEXTERONE PREMIX) 360 mg/200 mL dextrose 30 mg/hr (01/05/13 0400)  . DOPamine 3 mcg/kg/min (01/02/13 2000)  . feeding supplement (OSMOLITE 1.2 CAL) 1,000 mL (01/02/13 1730)  . norepinephrine (LEVOPHED) Adult infusion 2 mcg/min (01/05/13 0737)    Labs:  Recent Labs  01/04/13 0355 01/05/13 0419  NA 139 137  K 3.5 3.3*  CL 95* 93*  CO2 29 32  GLUCOSE 161* 132*  BUN 56* 58*   CREATININE 2.71* 2.26*  CALCIUM 8.3* 9.2  MG 1.6 1.9  PHOS 4.6 3.8     Recent Labs  01/04/13 0355 01/05/13 0419  WBC 4.6 6.8  HGB 14.6 14.2  HCT 38.8* 37.1*  MCV 73.8* 73.3*  PLT 132* 139*    Radiology/Studies: Ct Head Wo Contrast  01/02/2013  *RADIOLOGY REPORT*  Clinical Data: Cardiac arrest, rule out anoxic brain injury.  On Coumadin  CT HEAD WITHOUT CONTRAST  Technique:  Contiguous axial images were obtained from the base of the skull through the vertex without contrast.  Comparison: CT head 01/07/2007  Findings: Ventricle size is normal.  Negative for acute infarct. Mild chronic microvascular ischemic changes in the white matter. No evidence of cortical edema.  Pearline Cables and white matter differentiation is maintained.  Negative for hemorrhage or mass.  No acute infarct or edema.  No shift to the midline structures.  Mucosal edema in the paranasal sinuses.  Calcification in the cavernous carotid is mild.  No skull lesion.  Right frontal scalp lipoma over the convexities unchanged from the prior study and appears benign.  IMPRESSION: No acute intracranial abnormality.  If the patient continues to have symptoms of anoxic injury, follow-up CT or MRI is suggested.   Original Report Authenticated By: Carl Best, M.D.    Dg Chest Port 1 View  01/02/2013  *RADIOLOGY REPORT*  Clinical Data: Shortness of breath.  PORTABLE CHEST - 1 VIEW  Comparison: 01/01/2013.  Findings: Endotracheal tube tip 7.2 cm above the carina. Nasogastric tube tip gastric fundus.  Overlying leads noted.  Heart appears slightly prominent.  Calcified tortuous aorta.  Slight improved aeration right mid to lower lung zone.  Perihilar prominence may represent pulmonary vascular congestion/mild pulmonary edema.  Infectious infiltrate is a secondary less likely consideration.  IMPRESSION: Slight improved aeration right mid to lower lung zone.  Please see above.   Original Report Authenticated By: Genia Del, M.D.     Telemetry:  currently AF w/RVR; no further VT/VF    Assessment and Plan  Mr. Lloyd is a 72 year old man with PMH of ischemic cardiomyopathy, Atrial fib on warfarin, CVA, DM2, CKD, HTN, and previous cardiac arrest a year ago (2/2 severe hyperkalemia) admitted with V Fib/V Tach arrest now s/p emergent cardiac cath and induced hypothermia.  1. Ventricular Fibrillation arrest:  Witnessed, s/p CPR prior to ED presentation, s/p induced hypothermia, telemetry Atrial fib, Bp 165/54 on pressors. -recommend hold levophed and monitor bp -likely need ICD in future as this is pt's second episode of VF/VT cardiac arrest in 2 years  2. STEMI: cardiac enzymes elevated, s/p emergent cardiac cath, troponins trending towards normal    Recent Labs Lab 12/30/12 2312 12/31/12 0231 12/31/12 0700 12/31/12 1431 01/04/13 0917  TROPONINI 0.35* 2.78* 6.90* 19.30* 12.35*   3. Ischemic Cardiomyopathy: Echo EF55%>>30-35% with RV dysfunction, pt with extensive CAD hx  4. Acute on chronic renal failure: creatinine looks to have peaked, Renal US w/o hydronephrosis, diffusely echogenic bilaterally c/w medical renal disease, UOP adequate -Renal following  Recent Labs Lab 01/01/13 1900 01/02/13 0420 01/03/13 0432 01/04/13 0355 01/05/13 0419  CREATININE 2.37* 2.55* 3.01* 2.71* 2.26*   6. Anoxic brain injury: responsive, MRI w/o anoxic findings, EEG with severe encephalopathy -Neurology signed off as no further testing needed   Signed, Dorian Heckle , MD  History and all data above reviewed.  Patient examined.  I agree with the findings as above.  He is extubated and awake but not purposeful.  In atrial fib since 5/1. The patient exam reveals [id number]  ,  Lungs: Decreased  ,  Abd: Positive bowel sounds, no rebound no guarding, Ext No edema  .  All available labs, radiology testing, previous records reviewed. Agree with documented assessment and plan. Atrial fib:  Start heparin.  Change to PO amio when taking PO.  We  will need to consider long term PO anticoagulation pending his other issues and in particular his mental status.  No contraindication to anticoagulation at this point.  Vfib:  We will consider ICD pending other outcomes.  Cardiomyopathy:  EF is now 30 - 35%.  We will titrate meds in the future when his BP allows.    Mike Hamre  9:30 AM  01/05/2013;  PGY-2

## 2013-01-06 ENCOUNTER — Encounter (HOSPITAL_COMMUNITY): Payer: Self-pay | Admitting: Internal Medicine

## 2013-01-06 DIAGNOSIS — I5021 Acute systolic (congestive) heart failure: Secondary | ICD-10-CM

## 2013-01-06 DIAGNOSIS — I509 Heart failure, unspecified: Secondary | ICD-10-CM

## 2013-01-06 LAB — RENAL FUNCTION PANEL
BUN: 60 mg/dL — ABNORMAL HIGH (ref 6–23)
Chloride: 91 mEq/L — ABNORMAL LOW (ref 96–112)
Creatinine, Ser: 2.34 mg/dL — ABNORMAL HIGH (ref 0.50–1.35)
Glucose, Bld: 248 mg/dL — ABNORMAL HIGH (ref 70–99)
Phosphorus: 4.2 mg/dL (ref 2.3–4.6)
Potassium: 3.4 mEq/L — ABNORMAL LOW (ref 3.5–5.1)

## 2013-01-06 LAB — CBC
Hemoglobin: 14.5 g/dL (ref 13.0–17.0)
MCH: 27.7 pg (ref 26.0–34.0)
MCHC: 37.5 g/dL — ABNORMAL HIGH (ref 30.0–36.0)
RDW: 13.7 % (ref 11.5–15.5)

## 2013-01-06 LAB — HEPARIN LEVEL (UNFRACTIONATED)
Heparin Unfractionated: 0.28 IU/mL — ABNORMAL LOW (ref 0.30–0.70)
Heparin Unfractionated: 0.37 IU/mL (ref 0.30–0.70)

## 2013-01-06 MED ORDER — WHITE PETROLATUM GEL
Status: AC
  Administered 2013-01-06: 0.2
  Filled 2013-01-06: qty 5

## 2013-01-06 MED ORDER — HEPARIN (PORCINE) IN NACL 100-0.45 UNIT/ML-% IJ SOLN
1400.0000 [IU]/h | INTRAMUSCULAR | Status: DC
Start: 2013-01-06 — End: 2013-01-07
  Administered 2013-01-06 – 2013-01-07 (×2): 1400 [IU]/h via INTRAVENOUS
  Filled 2013-01-06 (×3): qty 250

## 2013-01-06 MED ORDER — POTASSIUM CHLORIDE 10 MEQ/100ML IV SOLN
10.0000 meq | INTRAVENOUS | Status: AC
  Administered 2013-01-06 (×4): 10 meq via INTRAVENOUS
  Filled 2013-01-06: qty 400

## 2013-01-06 NOTE — Progress Notes (Signed)
ANTICOAGULATION CONSULT NOTE  Pharmacy Consult for heparin  Indication: atrial fibrillation  No Known Allergies  Patient Measurements: Height: 6\' 2"  (188 cm) Weight: 169 lb 8.5 oz (76.9 kg) IBW/kg (Calculated) : 82.2 Heparin Dosing Weight: 80kg  Vital Signs: Temp: 98.8 F (37.1 C) (05/04 1700) BP: 120/74 mmHg (05/04 1700)  Labs:  Recent Labs  01/04/13 0355 01/04/13 0917 01/05/13 0419 01/05/13 2132 01/06/13 0809 01/06/13 1659  HGB 14.6  --  14.2  --  14.5  --   HCT 38.8*  --  37.1*  --  38.7*  --   PLT 132*  --  139*  --  159  --   LABPROT  --   --   --   --  17.3*  --   INR  --   --   --   --  1.46  --   HEPARINUNFRC  --   --   --  0.26* 0.28* 0.37  CREATININE 2.71*  --  2.26*  --  2.34*  --   TROPONINI  --  12.35*  --   --   --   --     Estimated Creatinine Clearance: 32.4 ml/min (by C-G formula based on Cr of 2.34).  Assessment: 72 year old male with h/o afib for heparin. S/p VT/VF arrest s/p hypothermia protocol.  Heparin level 0.37 now in goal range.  Goal of Therapy:  Heparin level 0.3-0.7 units/ml Monitor platelets by anticoagulation protocol: Yes   Plan:  Continue heparin 1400 units/hr. Next heparin level in am.  Kymari Lollis S. Alford Highland, PharmD, [facility] Clinical Staff Pharmacist Pager [phone]  01/06/2013 5:44 PM

## 2013-01-06 NOTE — Progress Notes (Signed)
Patient ID: CHARLETON FERA, male   DOB: 02-23-1941, 72 y.o.   MRN: [id number] S:more awake/alert/interactive.  Wants a drink of water O:BP 123/59  Pulse 101  Temp(Src) 99.1 F (37.3 C) (Core (Comment))  Resp 24  Ht 6\' 2"  (1.88 m)  Wt 76.9 kg (169 lb 8.5 oz)  BMI 21.76 kg/m2  SpO2 100%  Intake/Output Summary (Last 24 hours) at 01/06/13 1219 Last data filed at 01/06/13 1100  Gross per 24 hour  Intake 1153.95 ml  Output   2790 ml  Net -1636.05 ml   Intake/Output: I/O last 3 completed shifts: In: 2379.1 [I.V.:1564.1; NG/GT:715; IV Piggyback:100] Out: 4750 [Urine:4750]  Intake/Output this shift:  Total I/O In: 301.3 [I.V.:201.3; IV Piggyback:100] Out: 240 [Urine:240] Weight change: -4.3 kg (-9 lb 7.7 oz) [id number], chronically ill-appearing AAM who appears older than stated age CVS:no rub Resp:scattered rhonchi [id number] Ext:no edema   Recent Labs Lab 12/30/12 2312  12/31/12 0230  01/01/13 0505 01/01/13 0759  01/01/13 1500 01/01/13 1900 01/02/13 0420 01/03/13 0432 01/04/13 0355 01/05/13 0419 01/06/13 0809  NA 142  < > 135  < > 136 137  < > 138 140 141 138 139 137 138  K 3.9  < > 3.9  < > 3.6 3.6  < > 4.4 5.1 5.2* 4.7 3.5 3.3* 3.4*  CL 102  < > 105  < > 100 101  < > 100 102 102 97 95* 93* 91*  CO2 18*  --  15*  < > 23 25  < > 24 26 24 27 29  32 30  GLUCOSE 307*  < > 240*  < > 155* 107*  < > 86 92 107* 131* 161* 132* 248*  BUN 16  < > 22  < > 35* 35*  < > 36* 38* 42* 52* 56* 58* 60*  CREATININE 1.78*  < > 2.02*  < > 2.16* 2.20*  < > 2.22* 2.37* 2.55* 3.01* 2.71* 2.26* 2.34*  ALBUMIN 2.3*  --  2.9*  --   --  2.2*  --   --   --   --   --   --   --  2.0*  CALCIUM 13.4*  --  9.9  < > 8.4 8.3*  < > 7.8* 7.7* 7.6* 7.4* 8.3* 9.2 8.8  PHOS  --   --  6.2*  --  1.3*  --   --   --   --  5.8* 7.6* 4.6 3.8 4.2  AST 135*  --  282*  --   --  264*  --   --   --   --   --   --   --   --   ALT 109*  --  154*  --   --  125*  --   --   --   --   --   --   --   --   < > = values in  this interval not displayed. Liver Function Tests:  Recent Labs Lab 12/30/12 2312 12/31/12 0230 01/01/13 0759 01/06/13 0809  AST 135* 282* 264*  --   ALT 109* 154* 125*  --   ALKPHOS 135* 195* 149*  --   BILITOT 0.2* 0.4 0.8  --   PROT 5.2* 6.3 5.3*  --   ALBUMIN 2.3* 2.9* 2.2* 2.0*   No results found for this basename: LIPASE, AMYLASE,  in the last 168 hours No results found for this basename: AMMONIA,  in the last 168  hours CBC:  Recent Labs Lab 12/30/12 2312  01/02/13 0420 01/03/13 0432 01/04/13 0355 01/05/13 0419 01/06/13 0809  WBC 8.9  < > 12.5* 7.9 4.6 6.8 10.5  NEUTROABS 2.4  --   --   --   --   --   --   HGB 14.5  < > 16.4 15.5 14.6 14.2 14.5  HCT 39.9  < > 42.5 41.7 38.8* 37.1* 38.7*  MCV 77.5*  < > 73.3* 75.8* 73.8* 73.3* 74.0*  PLT 101*  < > 147* 134* 132* 139* 159  < > = values in this interval not displayed. Cardiac Enzymes:  Recent Labs Lab 12/30/12 2312 12/31/12 0231 12/31/12 0700 12/31/12 1431 01/04/13 0917  TROPONINI 0.35* 2.78* 6.90* 19.30* 12.35*   CBG:  Recent Labs Lab 01/04/13 1647 01/04/13 2004 01/04/13 2315 01/05/13 0358 01/05/13 0722  GLUCAP 111* 119* 129* 114* 151*    Iron Studies: No results found for this basename: IRON, TIBC, TRANSFERRIN, FERRITIN,  in the last 72 hours Studies/Results: Mr Brain Wo Contrast  01/04/2013  *RADIOLOGY REPORT*  Clinical Data: Cardiac arrest.  Evaluate for anoxic injury.  MRI HEAD WITHOUT CONTRAST  Technique:  Multiplanar, multiecho pulse sequences of the brain and surrounding structures were obtained according to standard protocol without intravenous contrast.  Comparison: 01/02/2013 CT.  No comparison MR.  Findings: No acute infarct.  No diffuse brain edema to suggest diffuse anoxic injury.  Mild small vessel disease type changes.  Global atrophy without hydrocephalus.  Major intracranial vascular structures are patent.  No intracranial hemorrhage.  Lipoma scalp convexity incidentally noted.  Mild  mucosal thickening mastoid air cells and paranasal sinuses. Pooled secretions within the upper pharynx.  IMPRESSION:  No acute infarct.  No diffuse brain edema to suggest diffuse anoxic injury.   Original Report Authenticated By: Genia Del, M.D.    Dg Chest Port 1 View  01/05/2013  *RADIOLOGY REPORT*  Clinical Data: Intubated, respiratory failure  PORTABLE CHEST - 1 VIEW  Comparison: 01/04/2013  Findings: Slight retraction of endotracheal tube now 7.8 cm above the carina.  NG tube in the stomach fundus as before.  Mild cardiac enlargement with slight improvement in perihilar and basilar edema pattern.  Minimal residual basilar atelectasis.  No enlarging effusion or pneumothorax.  IMPRESSION: Slight retraction of the endotracheal tube as above.  Improving edema pattern with residual atelectasis   Original Report Authenticated By: Jerilynn Mages. Annamaria Boots, M.D.    . antiseptic oral rinse  15 mL Mouth Rinse QID  . aspirin  81 mg Per NG tube Daily  . chlorhexidine  15 mL Mouth Rinse BID  . clopidogrel  75 mg Per NG tube Q breakfast  . furosemide  40 mg Intravenous Q12H  . potassium chloride  10 mEq Intravenous Q1 Hr x 4    BMET    Component Value Date/Time   NA 138 01/06/2013 0809   K 3.4* 01/06/2013 0809   CL 91* 01/06/2013 0809   CO2 30 01/06/2013 0809   GLUCOSE 248* 01/06/2013 0809   BUN 60* 01/06/2013 0809   CREATININE 2.34* 01/06/2013 0809   CALCIUM 8.8 01/06/2013 0809   GFRNONAA 27* 01/06/2013 0809   GFRAA 31* 01/06/2013 0809   CBC    Component Value Date/Time   WBC 10.5 01/06/2013 0809   RBC 5.23 01/06/2013 0809   HGB 14.5 01/06/2013 0809   HCT 38.7* 01/06/2013 0809   PLT 159 01/06/2013 0809   MCV 74.0* 01/06/2013 0809   MCH 27.7 01/06/2013 0809  MCHC 37.5* 01/06/2013 0809   RDW 13.7 01/06/2013 0809   LYMPHSABS 5.9* 12/30/2012 2312   MONOABS 0.5 12/30/2012 2312   EOSABS 0.2 12/30/2012 2312   BASOSABS 0.0 12/30/2012 2312     Assessment/Plan:  1. Acute kidney injury/CKD- likely ischemic ATN in setting of hypotension,  Cardiac arrest, further complicated by contrast nephropathy.  1. Marked increase in UOP and dropping Scr. Likely diuretic phase of recovery.  2. Follow I's/O's and would decrease diuretics to prevent worsening of his ATN.  3. renal ultrasound without hydro but did show cortical thinning. 4. CKD stage 3-4 with baseline Scr of around 1.5-1.8 5. F/u with his PCP and if needed he can be set up for outpt f/u with our practice although pt would make a poor dialysis candidate given ICMP and ongoing arrythmias. 6. Will sign off for now, as nothing further to add.  Call with questions or concerns. 2. Cardiac arrest with prolonged CPR-per Cards and PCCM.  3. AMS- post prolonged CPR and arrest. He is extubated and lethargic but does follow simple commands. MRI of brain did not show any cerebral edema to support anoxic injury. Will need PT/OT and swallowing eval.  4. Shock/hypotension-off of pressors. Prior history ischemic cardiomyopathy.  5. VDRF- s/p extubation. Plan per PCCM 6. Fever/pulmonary infiltrates- ? Aspiration PNA. abx per PCCm 7. Dispo- will likely need PT/OT or SNF depending upon his recovery and family wishes. 8.   Rainee Sweatt A

## 2013-01-06 NOTE — Progress Notes (Signed)
Patient: Logan White Date of Encounter: 01/06/2013, 8:32 AM Admit date: 12/30/2012     Subjective  The patient appears more alert this morning, and granddaughter at bedside states his mental status is significantly improved.  The patient spiked a temp of 100.8 last night.  Levophed discontinued yesterday, BP 100-120/60's.   Objective  Physical Exam: Vitals: BP 104/66  Pulse 101  Temp(Src) 99.3 F (37.4 C) (Core (Comment))  Resp 37  Ht 6\' 2"  (1.88 m)  Wt 169 lb 8.5 oz (76.9 kg)  BMI 21.76 kg/m2  SpO2 100% General: alert, sitting up in bed, can answer questions and identify family members HEENT: PERRL, EOMI, conjunctiva erythematous  Neck: supple, no lymphadenopathy, no JVD Lungs: course breath sounds in anterior fields Heart: distant heart sounds, irregularly irregular rhythm, regular rate, no m/g/r Extremities: no cyanosis, clubbing, or edema Neurologic: A&O x3, following commands  Intake/Output:  Intake/Output Summary (Last 24 hours) at 01/06/13 0832 Last data filed at 01/06/13 0800  Gross per 24 hour  Intake 1148.65 ml  Output   2920 ml  Net -1771.35 ml    Inpatient Medications:  . antiseptic oral rinse  15 mL Mouth Rinse QID  . aspirin  81 mg Per NG tube Daily  . chlorhexidine  15 mL Mouth Rinse BID  . clopidogrel  75 mg Per NG tube Q breakfast  . furosemide  40 mg Intravenous Q12H   . sodium chloride 20 mL/hr at 01/05/13 0401  . sodium chloride 20 mL/hr at 01/05/13 1600  . amiodarone (NEXTERONE PREMIX) 360 mg/200 mL dextrose 30 mg/hr (01/05/13 2355)  . heparin 1,250 Units/hr (01/05/13 2238)    Labs:  Recent Labs  01/04/13 0355 01/05/13 0419  NA 139 137  K 3.5 3.3*  CL 95* 93*  CO2 29 32  GLUCOSE 161* 132*  BUN 56* 58*  CREATININE 2.71* 2.26*  CALCIUM 8.3* 9.2  MG 1.6 1.9  PHOS 4.6 3.8     Recent Labs  01/04/13 0355 01/05/13 0419  WBC 4.6 6.8  HGB 14.6 14.2  HCT 38.8* 37.1*  MCV 73.8* 73.3*  PLT 132* 139*     Radiology/Studies: Ct Head Wo Contrast  01/02/2013  *RADIOLOGY REPORT*  Clinical Data: Cardiac arrest, rule out anoxic brain injury.  On Coumadin  CT HEAD WITHOUT CONTRAST  Technique:  Contiguous axial images were obtained from the base of the skull through the vertex without contrast.  Comparison: CT head 01/07/2007  Findings: Ventricle size is normal.  Negative for acute infarct. Mild chronic microvascular ischemic changes in the white matter. No evidence of cortical edema.  Pearline Cables and white matter differentiation is maintained.  Negative for hemorrhage or mass.  No acute infarct or edema.  No shift to the midline structures.  Mucosal edema in the paranasal sinuses.  Calcification in the cavernous carotid is mild.  No skull lesion.  Right frontal scalp lipoma over the convexities unchanged from the prior study and appears benign.  IMPRESSION: No acute intracranial abnormality.  If the patient continues to have symptoms of anoxic injury, follow-up CT or MRI is suggested.   Original Report Authenticated By: Carl Best, M.D.    Dg Chest Port 1 View  01/02/2013  *RADIOLOGY REPORT*  Clinical Data: Shortness of breath.  PORTABLE CHEST - 1 VIEW  Comparison: 01/01/2013.  Findings: Endotracheal tube tip 7.2 cm above the carina. Nasogastric tube tip gastric fundus.  Overlying leads noted.  Heart appears slightly prominent.  Calcified tortuous aorta.  Slight improved aeration right  mid to lower lung zone.  Perihilar prominence may represent pulmonary vascular congestion/mild pulmonary edema.  Infectious infiltrate is a secondary less likely consideration.  IMPRESSION: Slight improved aeration right mid to lower lung zone.  Please see above.   Original Report Authenticated By: Genia Del, M.D.     Telemetry: currently AF w/RVR; no further VT/VF    Assessment and Plan  Mr. Terrasi is a 72 year old man with PMH of ischemic cardiomyopathy, Atrial fib on warfarin, CVA, DM2, CKD, HTN, and previous cardiac arrest  a year ago (2/2 severe hyperkalemia) admitted with V Fib/V Tach arrest now s/p emergent cardiac cath and induced hypothermia.  1. Ventricular Fibrillation arrest:  Witnessed, s/p CPR prior to ED presentation, s/p induced hypothermia.  Telemetry shows continued afib, with BP in 100-120's/60's off pressors since 5/3. -continue aspirin, plavix -continue heparin drip -likely need ICD in future as this is pt's second episode of VF/VT cardiac arrest in 2 years  2. STEMI: cardiac enzymes elevated, s/p emergent cardiac cath, troponins trending towards normal  Recent Labs Lab 12/30/12 2312 12/31/12 0231 12/31/12 0700 12/31/12 1431 01/04/13 0917  TROPONINI 0.35* 2.78* 6.90* 19.30* 12.35*   3. Ischemic Cardiomyopathy: Echo EF55%>>30-35% with RV dysfunction, pt with extensive CAD hx -holding BB, ACE-I in setting of hypotension -on lasix 40 IV q12 hours  4. Acute on chronic renal failure: creatinine looks to have peaked, Renal US w/o hydronephrosis, diffusely echogenic bilaterally c/w medical renal disease, UOP adequate -Renal following  Recent Labs Lab 01/01/13 1900 01/02/13 0420 01/03/13 0432 01/04/13 0355 01/05/13 0419  CREATININE 2.37* 2.55* 3.01* 2.71* 2.26*   5. Atrial fibrillation - patient currently in afib on telemetry, with HR in 100's.  Patient started on heparin 5/3. -continue amiodarone drip, transition to PO if patient passes swallow screen -continue heparin -continue to discuss options for long-term anticoagulation  6. Anoxic brain injury: responsive, MRI w/o anoxic findings, EEG with severe encephalopathy -Neurology signed off as no further testing needed -SLP swallow eval today   Signed, Elnora Morrison , MD   History and all data above reviewed.  Patient examined.  I agree with the findings as above. Much more alert today.  No chest pain  The patient exam reveals [id number]  ,  Lungs: Decreased breath sounds  ,  Abd: Positive bowel sounds, no rebound no guarding,  Ext No edema  .  All available labs, radiology testing, previous records reviewed. Agree with documented assessment and plan. Atrial fib:  Switch to PO when taking PO.  On heparin.  Start systemic anticoagulation after we have further assessed his ability to comply with this.  Need further discussion with family.  V fib arrest:  We will need to consult EP before discharge to consider ICD.  Cardiomyopathy:  Hold on beta blocker until BP slightly better.   Jeneen Rinks [facility]  10:25 AM  01/06/2013
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
## 2013-01-06 NOTE — Progress Notes (Signed)
PULMONARY  / CRITICAL CARE MEDICINE  Name: Logan White MRN: [id number] DOB: 05/23/1941    ADMISSION DATE:  12/30/2012  REFERRING MD :  EDP PRIMARY SERVICE: PCCM  CHIEF COMPLAINT:  Cardiac arrest  BRIEF PATIENT DESCRIPTION:  72 yo with ischemic cardiomyopathy admitted for hypothermia protocol after VF/VT arrest with possible STEMI.  SIGNIFICANT EVENTS / STUDIES:  4/27 VT/VT cardiac arrest, hypothermia protocol initiated 4/28 Cardiac cath - no culprit new lesions 5/1   EEG >>> Severe encephalopathy 5/2   Brain MRI >>> No acute infarct, no edema 5/3   Extubated  LINES / TUBES: Left femoral  TLC 4/27 >>> 5/3 ETT 4/27 >>> 5/3 OGT 4/27>>> 5/3 Foley 4/27>>>   CULTURES:  ANTIBIOTICS:  SUBJECTIVE:  Remained extubated overnight.  No issues per RN.  VITAL SIGNS: Temp:  [99 F (37.2 C)-100.8 F (38.2 C)] 99.3 F (37.4 C) (05/04 0800) Resp:  [15-43] 37 (05/04 0800) BP: (92-148)/(52-112) 104/66 mmHg (05/04 0800) SpO2:  [95 %-100 %] 100 % (05/04 0800) Weight:  [76.9 kg (169 lb 8.5 oz)] 76.9 kg (169 lb 8.5 oz) (05/04 0457) HEMODYNAMICS:   VENTILATOR SETTINGS:    INTAKE / OUTPUT: Intake/Output     05/03 0701 - 05/04 0700 05/04 0701 - 05/05 0700   I.V. (mL/kg) 1036.9 (13.5) 49.2 (0.6)   NG/GT 55    IV Piggyback 100    Total Intake(mL/kg) 1191.9 (15.5) 49.2 (0.6)   Urine (mL/kg/hr) 2950 (1.6) 120 (0.8)   Total Output 2950 120   Net -1758.1 -70.8         PHYSICAL EXAMINATION: General: No distress Neuro: Sleepy, arouses to voice, follows commands HEENT: PERRL Cardiovascular:  Irregular / tachycardic Lungs:  Bilateral diminished air entry Abdomen:  Soft, no distended. Musculoskeletal:  Moves all extremities Skin:  Intact.  LABS:  Recent Labs Lab 12/30/12 2312  12/31/12 0230  12/31/12 0700  12/31/12 1210 12/31/12 1431  12/31/12 2115  01/01/13 0500  01/01/13 0759  01/03/13 0432  01/03/13 2000 01/04/13 0310 01/04/13 0355 01/04/13 0430 01/04/13 0851  01/04/13 0917 01/05/13 0419 01/05/13 0500 01/05/13 0808 01/06/13 0809  HGB 14.5  < > 16.7  --   --   < >  --   --   --   --   --   --   < >  --   < > 15.5  --   --   --  14.6  --   --   --  14.2  --   --   --   WBC 8.9  --  14.2*  --   --   --   --   --   --   --   --   --   < >  --   < > 7.9  --   --   --  4.6  --   --   --  6.8  --   --   --   PLT 101*  --  207  --   --   --   --   --   --   --   --   --   < >  --   < > 134*  --   --   --  132*  --   --   --  139*  --   --   --   NA 142  < > 135  < > 138  < > 136  --   < >  139  < >  --   < > 137  < > 138  --   --   --  139  --   --   --  137  --   --   --   K 3.9  < > 3.9  < > 2.7*  < > 2.8*  --   < > 3.4*  < >  --   < > 3.6  < > 4.7  --   --   --  3.5  --   --   --  3.3*  --   --   --   CL 102  < > 105  < > 103  < > 105  --   < > 105  < >  --   < > 101  < > 97  --   --   --  95*  --   --   --  93*  --   --   --   CO2 18*  --  15*  < > 16*  --  16*  --   < > 20  < >  --   < > 25  < > 27  --   --   --  29  --   --   --  32  --   --   --   GLUCOSE 307*  < > 240*  < > 295*  < > 217*  --   < > 139*  < >  --   < > 107*  < > 131*  --   --   --  161*  --   --   --  132*  --   --   --   BUN 16  < > 22  < > 25*  < > 28*  --   < > 32*  < >  --   < > 35*  < > 52*  --   --   --  56*  --   --   --  58*  --   --   --   CREATININE 1.78*  < > 2.02*  < > 2.16*  < > 2.17*  --   < > 2.18*  < >  --   < > 2.20*  < > 3.01*  --   --   --  2.71*  --   --   --  2.26*  --   --   --   CALCIUM 13.4*  --  9.9  < > 9.8  --  9.5  --   < > 8.8  < >  --   < > 8.3*  < > 7.4*  --   --   --  8.3*  --   --   --  9.2  --   --   --   MG  --   --  3.4*  --   --   --   --   --   --   --   --   --   < >  --   < > 1.6  --   --   --  1.6  --   --   --  1.9  --   --   --   PHOS  --   --  6.2*  --   --   --   --   --   --   --   --   --   < >  --   < >  7.6*  --   --   --  4.6  --   --   --  3.8  --   --   --   AST 135*  --  282*  --   --   --   --   --   --   --   --   --   --  264*  --    --   --   --   --   --   --   --   --   --   --   --   --   ALT 109*  --  154*  --   --   --   --   --   --   --   --   --   --  125*  --   --   --   --   --   --   --   --   --   --   --   --   --   ALKPHOS 135*  --  195*  --   --   --   --   --   --   --   --   --   --  149*  --   --   --   --   --   --   --   --   --   --   --   --   --   BILITOT 0.2*  --  0.4  --   --   --   --   --   --   --   --   --   --  0.8  --   --   --   --   --   --   --   --   --   --   --   --   --   PROT 5.2*  --  6.3  --   --   --   --   --   --   --   --   --   --  5.3*  --   --   --   --   --   --   --   --   --   --   --   --   --   ALBUMIN 2.3*  --  2.9*  --   --   --   --   --   --   --   --   --   --  2.2*  --   --   --   --   --   --   --   --   --   --   --   --   --   APTT 54*  --  163*  --   --   --  75*  --   --   --   --   --   --   --   --   --   --   --   --   --   --   --   --   --   --   --   --   INR 2.72*  --  9.05*  --   --   --  3.64*  --   < > 3.75*  --  2.86*  --   --   --   --   --   --   --   --   --   --   --   --   --   --  1.46  LATICACIDVEN  --   --   --   --   --   < >  --   --   < >  --   --   --   --  3.2*  < >  --   < > 2.3*  --   --  2.2 1.9  --   --   --   --   --   TROPONINI 0.35*  --   --   < > 6.90*  --   --  19.30*  --   --   --   --   --   --   --   --   --   --   --   --   --   --  12.35*  --   --   --   --   PHART  --   < >  --   --   --   < >  --   --   --   --   < >  --   < >  --   < >  --   --   --  7.412  --   --   --   --   --  7.468* 7.417  --   PCO2ART  --   < >  --   --   --   < >  --   --   --   --   < >  --   < >  --   < >  --   --   --  47.3*  --   --   --   --   --  45.8* 56.7*  --   PO2ART  --   < >  --   --   --   < >  --   --   --   --   < >  --   < >  --   < >  --   --   --  64.0*  --   --   --   --   --  85.9 89.0  --   < > = values in this interval not displayed.  Recent Labs Lab 01/04/13 1647 01/04/13 2004 01/04/13 2315 01/05/13 0358 01/05/13 0722   GLUCAP 111* 119* 129* 114* 151*   CXR: None today  ASSESSMENT / PLAN:  PULMONARY A: No active issues. P:   Goal SpO2>92 Supplemental oxygen PRN Incentive spirometry Add flutter valve  CARDIOVASCULAR A: S/p VF/VT arrest. STEMI.  AF/RVR. P:  Cardiology following Aspirin / Plavix Statin contraindicated - elevated LFTs  Beta blocker  / ACEI contraindicated -  hypotension / AKI Amiodarone Cardiology considering permanent pacer placement  RENAL A:  CKD.  Acute on chronic renal failure. P:   Renal following Trend BMP Lasix Replete K PRN  GASTROINTESTINAL A:  No active issues P:   NPO SLP eval after extubation GI Px is not indicated  HEM: No active issues. P:  Trend CBC Heparin gtt DVT Px is not indicated (on Heparin gtt)  INFECTIOUS A:  No active issues. P:   No intervention required  ENDOCRINE A:  Hyperglycemia, suspected DM. P:   SSI  NEUROLOGIC A:  Acute encephalopathy, improving. P:   Neuro following  I have personally obtained a history, examined the patient, evaluated laboratory and imaging results, formulated the assessment and plan and placed  orders.  Doree Fudge, M.D. Pulmonary and [place] Pager: [phone]  01/06/2013, 8:53 AM

## 2013-01-06 NOTE — Progress Notes (Addendum)
ANTICOAGULATION CONSULT NOTE  Pharmacy Consult for heparin  Indication: atrial fibrillation  No Known Allergies  Patient Measurements: Height: 6\' 2"  (188 cm) Weight: 169 lb 8.5 oz (76.9 kg) IBW/kg (Calculated) : 82.2 Heparin Dosing Weight: 80kg  Vital Signs: Temp: 99.3 F (37.4 C) (05/04 0800) BP: 104/66 mmHg (05/04 0800)  Labs:  Recent Labs  01/04/13 0355 01/04/13 0917 01/05/13 0419 01/05/13 2132 01/06/13 0809  HGB 14.6  --  14.2  --   --   HCT 38.8*  --  37.1*  --   --   PLT 132*  --  139*  --   --   LABPROT  --   --   --   --  17.3*  INR  --   --   --   --  1.46  HEPARINUNFRC  --   --   --  0.26* 0.28*  CREATININE 2.71*  --  2.26*  --   --   TROPONINI  --  12.35*  --   --   --     Estimated Creatinine Clearance: 33.6 ml/min (by C-G formula based on Cr of 2.26).  Assessment: 72 year old male with h/o afib for heparin.  Level continues to be below goal on 1250 units/hr. No bleeding issues noted.   Goal of Therapy:  Heparin level 0.3-0.7 units/ml Monitor platelets by anticoagulation protocol: Yes   Plan:  Increase Heparin 1400 units/hr Follow-up HL in 8 hours  Erin Hearing PharmD., BCPS Clinical Pharmacist Pager [phone] 01/06/2013 9:05 AM

## 2013-01-07 DIAGNOSIS — N289 Disorder of kidney and ureter, unspecified: Secondary | ICD-10-CM

## 2013-01-07 DIAGNOSIS — N189 Chronic kidney disease, unspecified: Secondary | ICD-10-CM

## 2013-01-07 LAB — CBC
HCT: 41.1 % (ref 39.0–52.0)
Hemoglobin: 15.8 g/dL (ref 13.0–17.0)
MCV: 73.9 fL — ABNORMAL LOW (ref 78.0–100.0)
WBC: 13.7 10*3/uL — ABNORMAL HIGH (ref 4.0–10.5)

## 2013-01-07 LAB — RENAL FUNCTION PANEL
CO2: 24 mEq/L (ref 19–32)
GFR calc Af Amer: 29 mL/min — ABNORMAL LOW (ref >90)
Glucose, Bld: 86 mg/dL (ref 70–99)
Potassium: 3.7 mEq/L (ref 3.5–5.1)
Sodium: 143 mEq/L (ref 135–145)

## 2013-01-07 LAB — GLUCOSE, CAPILLARY: Glucose-Capillary: 118 mg/dL — ABNORMAL HIGH (ref 70–99)

## 2013-01-07 LAB — HEPARIN LEVEL (UNFRACTIONATED): Heparin Unfractionated: 0.3 IU/mL (ref 0.30–0.70)

## 2013-01-07 MED ORDER — HEPARIN (PORCINE) IN NACL 100-0.45 UNIT/ML-% IJ SOLN
1500.0000 [IU]/h | INTRAMUSCULAR | Status: DC
  Administered 2013-01-07 – 2013-01-11 (×5): 1500 [IU]/h via INTRAVENOUS
  Filled 2013-01-07 (×10): qty 250

## 2013-01-07 MED ORDER — WHITE PETROLATUM GEL
Status: AC
  Administered 2013-01-07: 1
  Filled 2013-01-07: qty 5

## 2013-01-07 NOTE — Progress Notes (Signed)
Transferred in from 2907 by chair. Awake and alert.

## 2013-01-07 NOTE — Progress Notes (Signed)
Patient ID: Logan White, male   DOB: Apr 26, 1941, 72 y.o.   MRN: [id number] Subjective:  Awake, denies chest pain.   Objective:  Vital Signs in the last 24 hours: Temp:  [98.1 F (36.7 C)-99.3 F (37.4 C)] 98.1 F (36.7 C) (05/05 0800) Resp:  [21-39] 33 (05/05 0700) BP: (100-157)/(55-116) 100/76 mmHg (05/05 0700) SpO2:  [92 %-100 %] 97 % (05/05 0700)  Intake/Output from previous day: 05/04 0701 - 05/05 0700 In: 1550.6 [I.V.:1150.6; IV Piggyback:400] Out: 2655 [Urine:2655] Intake/Output from this shift: Total I/O In: 73.4 [I.V.:73.4] Out: -   Physical Exam: stable appearing NAD HEENT: Unremarkable Neck:  7 cm JVD, no thyromegally Lungs:  Clear with scattered rales HEART:  Regular rate rhythm, no murmurs, no rubs, no clicks Abd:  Flat, positive bowel sounds, no organomegally, no rebound, no guarding Ext:  2 plus pulses, no edema, no cyanosis, no clubbing Skin:  No rashes no nodules Neuro:  CN II through XII intact, motor grossly intact, oriented to name and hospital, not date or place   Lab Results:  Recent Labs  01/06/13 0809 01/07/13 0415  WBC 10.5 13.7*  HGB 14.5 15.8  PLT 159 168    Recent Labs  01/06/13 0809 01/07/13 0415  NA 138 143  K 3.4* 3.7  CL 91* 96  CO2 30 24  GLUCOSE 248* 86  BUN 60* 66*  CREATININE 2.34* 2.50*   No results found for this basename: TROPONINI, CK, MB,  in the last 72 hours Hepatic Function Panel  Recent Labs  01/07/13 0415  ALBUMIN 2.2*   No results found for this basename: CHOL,  in the last 72 hours No results found for this basename: PROTIME,  in the last 72 hours  Imaging: No results found.  Cardiac Studies: Tele - atrial fib with RVR Assessment/Plan:  1. NSTEMI, Troponin 20 2. VF arrest, s/p cooling 3. Encephalopathy due to anoxia, improving, awaiting swallow eval. 4. ICM in setting of recent arrest 5. Atrial fib with an RVR Rec: as patient's cardiac arrest due to NSTEMI, no indication for ICD at this  time. His EF now reduced (previously normal). In addition to maximal medical therapy, would discharge with life vest. Would repeat EF as an outpatient in 3 months. If EF remains down, then ICD for primary prevention would be warranted. VF arrest due to a secondary cause is not an indication for ICD.   LOS: 8 days    Gregg Goynes,M.D. 01/07/2013, 10:17 AM

## 2013-01-07 NOTE — Progress Notes (Addendum)
PULMONARY  / CRITICAL CARE MEDICINE  Name: Logan White MRN: [id number] DOB: 1940/09/08    ADMISSION DATE:  12/30/2012  REFERRING MD :  EDP PRIMARY SERVICE: PCCM  CHIEF COMPLAINT:  Cardiac arrest  BRIEF PATIENT DESCRIPTION:  72 yo with ischemic cardiomyopathy admitted for hypothermia protocol after VF/VT arrest with possible STEMI.  SIGNIFICANT EVENTS / STUDIES:  4/27 VT/VT cardiac arrest, hypothermia protocol initiated 4/28 Cardiac cath - no culprit new lesions 5/1   EEG >>> Severe encephalopathy 5/2   Brain MRI >>> No acute infarct, no edema 5/3   Extubated  LINES / TUBES: Left femoral  TLC 4/27 >>> 5/3 ETT 4/27 >>> 5/3 OGT 4/27>>> 5/3 Foley 4/27>>>   CULTURES:  ANTIBIOTICS:  SUBJECTIVE:  Remained extubated overnight.  No issues per RN.  VITAL SIGNS: Temp:  [98.1 F (36.7 C)-99.3 F (37.4 C)] 98.1 F (36.7 C) (05/05 0800) Resp:  [21-39] 33 (05/05 0700) BP: (100-157)/(55-116) 100/76 mmHg (05/05 0700) SpO2:  [92 %-100 %] 97 % (05/05 0700) HEMODYNAMICS:   VENTILATOR SETTINGS:    INTAKE / OUTPUT: Intake/Output     05/04 0701 - 05/05 0700 05/05 0701 - 05/06 0700   I.V. (mL/kg) 1150.6 (15) 73.4 (1)   NG/GT     IV Piggyback 400    Total Intake(mL/kg) 1550.6 (20.2) 73.4 (1)   Urine (mL/kg/hr) 2655 (1.4)    Total Output 2655     Net -1104.4 +73.4        Emesis Occurrence 2 x     PHYSICAL EXAMINATION: General: No distress Neuro: Sleepy, arouses to voice, follows commands HEENT: PERRL Cardiovascular:  Irregular / tachycardic Lungs:  Bilateral diminished air entry Abdomen:  Soft, no distended. Musculoskeletal:  Moves all extremities Skin:  Intact.  LABS:  Recent Labs Lab 12/31/12 1210 12/31/12 1431  12/31/12 2115  01/01/13 0500  01/01/13 0759  01/03/13 0432  01/03/13 2000 01/04/13 0310 01/04/13 0355 01/04/13 0430 01/04/13 [id number] 01/04/13 [id number] 01/05/13 0419 01/05/13 0500 01/05/13 0808 01/06/13 0809 01/07/13 0415  HGB  --   --   --   --    --   --   < >  --   < > 15.5  --   --   --  14.6  --   --   --  14.2  --   --  14.5 15.8  WBC  --   --   --   --   --   --   < >  --   < > 7.9  --   --   --  4.6  --   --   --  6.8  --   --  10.5 13.7*  PLT  --   --   --   --   --   --   < >  --   < > 134*  --   --   --  132*  --   --   --  139*  --   --  159 168  NA 136  --   < > 139  < >  --   < > 137  < > 138  --   --   --  139  --   --   --  137  --   --  138 143  K 2.8*  --   < > 3.4*  < >  --   < > 3.6  < > 4.7  --   --   --  3.5  --   --   --  3.3*  --   --  3.4* 3.7  CL 105  --   < > 105  < >  --   < > 101  < > 97  --   --   --  95*  --   --   --  93*  --   --  91* 96  CO2 16*  --   < > 20  < >  --   < > 25  < > 27  --   --   --  29  --   --   --  32  --   --  30 24  GLUCOSE 217*  --   < > 139*  < >  --   < > 107*  < > 131*  --   --   --  161*  --   --   --  132*  --   --  248* 86  BUN 28*  --   < > 32*  < >  --   < > 35*  < > 52*  --   --   --  56*  --   --   --  58*  --   --  60* 66*  CREATININE 2.17*  --   < > 2.18*  < >  --   < > 2.20*  < > 3.01*  --   --   --  2.71*  --   --   --  2.26*  --   --  2.34* 2.50*  CALCIUM 9.5  --   < > 8.8  < >  --   < > 8.3*  < > 7.4*  --   --   --  8.3*  --   --   --  9.2  --   --  8.8 9.6  MG  --   --   --   --   --   --   < >  --   < > 1.6  --   --   --  1.6  --   --   --  1.9  --   --   --   --   PHOS  --   --   --   --   --   --   < >  --   < > 7.6*  --   --   --  4.6  --   --   --  3.8  --   --  4.2 3.9  AST  --   --   --   --   --   --   --  264*  --   --   --   --   --   --   --   --   --   --   --   --   --   --   ALT  --   --   --   --   --   --   --  125*  --   --   --   --   --   --   --   --   --   --   --   --   --   --   ALKPHOS  --   --   --   --   --   --   --  149*  --   --   --   --   --   --   --   --   --   --   --   --   --   --   BILITOT  --   --   --   --   --   --   --  0.8  --   --   --   --   --   --   --   --   --   --   --   --   --   --   PROT  --   --   --   --   --   --   --   5.3*  --   --   --   --   --   --   --   --   --   --   --   --   --   --   ALBUMIN  --   --   --   --   --   --   --  2.2*  --   --   --   --   --   --   --   --   --   --   --   --  2.0* 2.2*  APTT 75*  --   --   --   --   --   --   --   --   --   --   --   --   --   --   --   --   --   --   --   --   --   INR 3.64*  --   < > 3.75*  --  2.86*  --   --   --   --   --   --   --   --   --   --   --   --   --   --  1.46  --   LATICACIDVEN  --   --   < >  --   --   --   --  3.2*  < >  --   < > 2.3*  --   --  2.2 1.9  --   --   --   --   --   --   TROPONINI  --  19.30*  --   --   --   --   --   --   --   --   --   --   --   --   --   --  12.35*  --   --   --   --   --   PHART  --   --   --   --   < >  --   < >  --   < >  --   --   --  7.412  --   --   --   --   --  7.468* 7.417  --   --   PCO2ART  --   --   --   --   < >  --   < >  --   < >  --   --   --  47.3*  --   --   --   --   --  45.8* 56.7*  --   --   PO2ART  --   --   --   --   < >  --   < >  --   < >  --   --   --  64.0*  --   --   --   --   --  85.9 89.0  --   --   < > = values in this interval not displayed.  Recent Labs Lab 01/04/13 1647 01/04/13 2004 01/04/13 2315 01/05/13 0358 01/05/13 0722  GLUCAP 111* 119* 129* 114* 151*   CXR: None today  ASSESSMENT / PLAN:  PULMONARY A: No active issues. P:   - Goal SpO2>92. - Supplemental oxygen PRN. - Incentive spirometry.  CARDIOVASCULAR A: S/p VF/VT arrest. STEMI.  AF/RVR. P:  - Cardiology following. - Aspirin / Plavix. - Statin contraindicated - elevated LFTs. - Beta blocker/ACEI contraindicated -  hypotension / AKI. - Amiodarone. - Cardiology considering permanent pacer placement.  RENAL A:  CKD.  Acute on chronic renal failure. P:   - Renal following. - Trend BMP. - Lasix as ordered. - Replete K PRN.  GASTROINTESTINAL A:  No active issues P:   - NPO. - SLP eval again 5/5. - GI Px is not indicated.  HEM: No active issues. P:  - Trend CBC. - Heparin  gtt. - DVT Px is not indicated (on Heparin gtt).  INFECTIOUS A:  No active issues. P:   - No intervention required.  ENDOCRINE A:  Hyperglycemia, suspected DM. P:   - SSI.  NEUROLOGIC A:  Acute encephalopathy, improving. P:   - Neuro following.  Will transfer to SDU, cardiology arranging for ICD implantation.  Transfer to [facility], PCCM will sign off, please call back if needed.  Jennet Maduro, M.D. [facility] Pager: [phone]  01/07/2013, 9:38 AM

## 2013-01-07 NOTE — Progress Notes (Signed)
Patient: Logan White Date of Encounter: 01/07/2013, 7:15 AM Admit date: 12/30/2012     Subjective  No acute events overnight, pt continues to improve and become more alert especially to family.   Objective  Physical Exam: Vitals: BP 122/76  Pulse 101  Temp(Src) 99.3 F (37.4 C) (Core (Comment))  Resp 35  Ht 6\' 2"  (1.88 m)  Wt 169 lb 8.5 oz (76.9 kg)  BMI 21.76 kg/m2  SpO2 93% General: Well developed, AA male, sitting in bedside recliner HEENT: Normocephalic, atraumatic, injected sclera bilaterally Neck: Supple Lungs: Coarse breath sounds anterior fields Heart: Distant heart sounds. Irregularly irregular S1 S2 without murmur, rub or gallop appreciated  Abdomen/GU: Soft, non-distended, hypoactive bowel sounds, foley in place Extremities: BLE cool, diminished pedal pulses Neuro: sleeping in recliner but easily arousable, fluent speech and follows commands  Intake/Output:  Intake/Output Summary (Last 24 hours) at 01/07/13 0715 Last data filed at 01/07/13 0600  Gross per 24 hour  Intake 1513.9 ml  Output   2655 ml  Net -1141.1 ml    Inpatient Medications:  . antiseptic oral rinse  15 mL Mouth Rinse QID  . aspirin  81 mg Per NG tube Daily  . chlorhexidine  15 mL Mouth Rinse BID  . clopidogrel  75 mg Per NG tube Q breakfast  . furosemide  40 mg Intravenous Q12H   . sodium chloride 20 mL/hr at 01/05/13 0401  . sodium chloride 20 mL/hr at 01/05/13 1600  . amiodarone (NEXTERONE PREMIX) 360 mg/200 mL dextrose 30 mg/hr (01/07/13 0057)  . heparin 1,400 Units/hr (01/07/13 0311)    Labs:  Recent Labs  01/05/13 0419 01/06/13 0809 01/07/13 0415  NA 137 138 143  K 3.3* 3.4* 3.7  CL 93* 91* 96  CO2 32 30 24  GLUCOSE 132* 248* 86  BUN 58* 60* 66*  CREATININE 2.26* 2.34* 2.50*  CALCIUM 9.2 8.8 9.6  MG 1.9  --   --   PHOS 3.8 4.2 3.9     Recent Labs  01/06/13 0809 01/07/13 0415  WBC 10.5 13.7*  HGB 14.5 15.8  HCT 38.7* 41.1  MCV 74.0* 73.9*  PLT 159 168     Radiology/Studies: Ct Head Wo Contrast  01/02/2013  *RADIOLOGY REPORT*  Clinical Data: Cardiac arrest, rule out anoxic brain injury.  On Coumadin  CT HEAD WITHOUT CONTRAST  Technique:  Contiguous axial images were obtained from the base of the skull through the vertex without contrast.  Comparison: CT head 01/07/2007  Findings: Ventricle size is normal.  Negative for acute infarct. Mild chronic microvascular ischemic changes in the white matter. No evidence of cortical edema.  Pearline Cables and white matter differentiation is maintained.  Negative for hemorrhage or mass.  No acute infarct or edema.  No shift to the midline structures.  Mucosal edema in the paranasal sinuses.  Calcification in the cavernous carotid is mild.  No skull lesion.  Right frontal scalp lipoma over the convexities unchanged from the prior study and appears benign.  IMPRESSION: No acute intracranial abnormality.  If the patient continues to have symptoms of anoxic injury, follow-up CT or MRI is suggested.   Original Report Authenticated By: Carl Best, M.D.    Dg Chest Port 1 View  01/02/2013  *RADIOLOGY REPORT*  Clinical Data: Shortness of breath.  PORTABLE CHEST - 1 VIEW  Comparison: 01/01/2013.  Findings: Endotracheal tube tip 7.2 cm above the carina. Nasogastric tube tip gastric fundus.  Overlying leads noted.  Heart appears slightly prominent.  Calcified tortuous aorta.  Slight improved aeration right mid to lower lung zone.  Perihilar prominence may represent pulmonary vascular congestion/mild pulmonary edema.  Infectious infiltrate is a secondary less likely consideration.  IMPRESSION: Slight improved aeration right mid to lower lung zone.  Please see above.   Original Report Authenticated By: Genia Del, M.D.     Telemetry: continued A fib    Assessment and Plan  Mr. Torbeck is a 72 year old man with PMH of ischemic cardiomyopathy, Atrial fib on warfarin, CVA, DM2, CKD, HTN, and previous cardiac arrest a year ago (2/2  severe hyperkalemia) admitted with V Fib/V Tach arrest now s/p emergent cardiac cath and induced hypothermia.  1. Ventricular Fibrillation arrest:  Witnessed, s/p CPR prior to ED presentation, s/p induced hypothermia, telemetry Atrial fib, Bp 122/76, pt off pressors. -Possibly need ICD in future as this is pt's second episode of VF/VT cardiac arrest in 2 years, will consult EP  2. STEMI: cardiac enzymes trending downwards, s/p emergent cardiac cath -cont aspirin and plavix  Recent Labs Lab 12/31/12 1431 01/04/13 0917  TROPONINI 19.30* 12.35*   3. Ischemic Cardiomyopathy: Echo EF55%>>30-35% with RV dysfunction, pt with extensive CAD hx -on Lasix 40 mg IV q12 -will need ICD as noted above   4. Atrial fibrillation: cont Afib on telemetry, on amiodarone IV -transition to amiodarone oral once taking orals, swallowing fxn to be re-assessed today -pt on Heparin gtt, will need long term anticoag  5. Acute on chronic renal failure: likely secondary to ATN, creatinine looks to have peaked, Renal US w/o hydronephrosis, diffusely echogenic bilaterally c/w medical renal disease, UOP adequate, pt stable from renal aspect -Renal signed off  Recent Labs Lab 01/03/13 0432 01/04/13 0355 01/05/13 0419 01/06/13 0809 01/07/13 0415  CREATININE 3.01* 2.71* 2.26* 2.34* 2.50*   7. Anoxic brain injury: alert and responsive, MRI w/o anoxic findings, EEG with severe encephalopathy -Neurology signed off as no further testing needed   Signed, Dorian Heckle , MD  Patient examined chart reviewed.  Renal function improving Agree with EP evaluation for consideration of AICD  QRS narrow and would not necessarily need BiV.  D/C foley  Jenkins Rouge

## 2013-01-07 NOTE — Progress Notes (Signed)
Speech Language Pathology Dysphagia Treatment Patient Details Name: Logan White MRN: [id number] DOB: [birth year] Today's Date: 01/07/2013 Time: 1200-1215 SLP Time Calculation (min): 15 min  Assessment / Plan / Recommendation Clinical Impression  Pt.'s swallow ability appears to have improved yesterday per documentation of bedside swallow assessment.  Today no evidence of aspiration was observed with multiple trials.  Possibly a suspected delay in swallow initiation versus oral delays.  Pt. required min-mod verbal/visual cues to take smaller bites.  Wife reports he does not consistenly wear dentures when eating at home.  Recommend pt. initiate po's with a Dys 2 diet texture to conserve energy and lack of upper dentition.  Textures can be upgraded by SLP as pt. progresses.  Recommend thin via cup or straw, small sips and bites, full supervision and pills whole in applesauce.    Diet Recommendation  Initiate / Change Diet: Dysphagia 2 (fine chop);Thin liquid    SLP Plan Continue with current plan of care   Pertinent Vitals/Pain none   Swallowing Goals  SLP Swallowing Goals Patient will utilize recommended strategies during swallow to increase swallowing safety with: Minimal cueing Goal #3: Pt will consume PO trials at bedside without overt s/s of aspiration to assess readiness for a PO diet. Swallow Study Goal #3 - Progress: Met  General Temperature Spikes Noted: No Respiratory Status: Supplemental O2 delivered via (comment) Behavior/Cognition: Alert;Cooperative;Pleasant mood;Requires cueing Oral Cavity - Dentition:  (natural lower, top palte not in hospital) Patient Positioning: Upright in bed  Oral Cavity - Oral Hygiene Does patient have any of the following "at risk" factors?: Oxygen therapy - cannula, mask, simple oxygen devices Brush patient's teeth BID with toothbrush (using toothpaste with fluoride): Yes Patient is AT RISK - Oral Care Protocol followed (see row info): Yes    Dysphagia Treatment Treatment focused on: Patient/family/caregiver education;Facilitation of oral preparatory phase;Facilitation of oral phase;Facilitation of pharyngeal phase Family/Caregiver Educated: wife Treatment Methods/Modalities: Skilled observation;Differential diagnosis Patient observed directly with PO's: Yes Type of PO's observed: Thin liquids;Dysphagia 1 (puree);Dysphagia 3 (soft) Feeding: Able to feed self;Needs assist;Needs set up Liquids provided via: Teaspoon;Cup;Straw Oral Phase Signs & Symptoms: Prolonged mastication;Prolonged bolus formation;Prolonged oral phase (with cracker only) Pharyngeal Phase Signs & Symptoms: Suspected delayed swallow initiation Type of cueing: Verbal;Visual Amount of cueing: Minimal   GO     Houston Siren M.Ed [employer] [phone]  01/07/2013

## 2013-01-07 NOTE — Progress Notes (Signed)
[facility] for heparin  Indication: atrial fibrillation  No Known Allergies  Patient Measurements: Height: 6\' 2"  (188 cm) Weight: 169 lb 8.5 oz (76.9 kg) IBW/kg (Calculated) : 82.2 Heparin Dosing Weight: 80kg  Vital Signs: Temp: 98.2 F (36.8 C) (05/05 1100) Temp src: Oral (05/05 1100) BP: 113/86 mmHg (05/05 1100)  Labs:  Recent Labs  01/05/13 0419  01/06/13 0809 01/06/13 1659 01/07/13 0415 01/07/13 1353  HGB 14.2  --  14.5  --  15.8  --   HCT 37.1*  --  38.7*  --  41.1  --   PLT 139*  --  159  --  168  --   LABPROT  --   --  17.3*  --   --   --   INR  --   --  1.46  --   --   --   HEPARINUNFRC  --   < > 0.28* 0.37 <0.10* 0.30  CREATININE 2.26*  --  2.34*  --  2.50*  --   < > = values in this interval not displayed.  Estimated Creatinine Clearance: 30.3 ml/min (by C-G formula based on Cr of 2.5).  Assessment: 72 year old male with h/o afib for heparin. S/p VT/VF arrest s/p hypothermia protocol.  Heparin level undetectable this morning due to IV out. Repeat level 0.3 in goal range. No bleeding complications noted. Patient does not qualify for ICD at this time. Swallowing ability improving, will need long term anticoagulation, will continue to follow.  Goal of Therapy:  Heparin level 0.3-0.7 units/ml Monitor platelets by anticoagulation protocol: Yes   Plan:  Continue heparin 1500 units/hr. Next heparin level in am.  Erin Hearing PharmD., BCPS Clinical Pharmacist Pager [phone] 01/07/2013 2:58 PM

## 2013-01-08 DIAGNOSIS — Z7901 Long term (current) use of anticoagulants: Secondary | ICD-10-CM

## 2013-01-08 DIAGNOSIS — R7989 Other specified abnormal findings of blood chemistry: Secondary | ICD-10-CM

## 2013-01-08 LAB — RENAL FUNCTION PANEL
Albumin: 2.6 g/dL — ABNORMAL LOW (ref 3.5–5.2)
BUN: 58 mg/dL — ABNORMAL HIGH (ref 6–23)
GFR calc Af Amer: 31 mL/min — ABNORMAL LOW (ref >90)
GFR calc non Af Amer: 27 mL/min — ABNORMAL LOW (ref >90)
Phosphorus: 3.4 mg/dL (ref 2.3–4.6)
Potassium: 2.8 mEq/L — ABNORMAL LOW (ref 3.5–5.1)
Sodium: 139 mEq/L (ref 135–145)

## 2013-01-08 LAB — POTASSIUM: Potassium: 3.2 mEq/L — ABNORMAL LOW (ref 3.5–5.1)

## 2013-01-08 LAB — CBC
MCH: 27.9 pg (ref 26.0–34.0)
MCV: 72.4 fL — ABNORMAL LOW (ref 78.0–100.0)
Platelets: UNDETERMINED 10*3/uL (ref 150–400)
RBC: 5.62 MIL/uL (ref 4.22–5.81)
RDW: 13.5 % (ref 11.5–15.5)
WBC: 18.6 10*3/uL — ABNORMAL HIGH (ref 4.0–10.5)

## 2013-01-08 LAB — HEPARIN LEVEL (UNFRACTIONATED): Heparin Unfractionated: 0.31 IU/mL (ref 0.30–0.70)

## 2013-01-08 MED ORDER — ENSURE COMPLETE PO LIQD
237.0000 mL | Freq: Two times a day (BID) | ORAL | Status: DC
  Administered 2013-01-08 – 2013-01-11 (×5): 237 mL via ORAL

## 2013-01-08 MED ORDER — MAGNESIUM SULFATE 40 MG/ML IJ SOLN
2.0000 g | Freq: Once | INTRAMUSCULAR | Status: AC
  Administered 2013-01-08: 2 g via INTRAVENOUS
  Filled 2013-01-08: qty 50

## 2013-01-08 MED ORDER — METOPROLOL TARTRATE 12.5 MG HALF TABLET
12.5000 mg | ORAL_TABLET | Freq: Two times a day (BID) | ORAL | Status: DC
  Administered 2013-01-08 (×2): 12.5 mg via ORAL
  Filled 2013-01-08 (×4): qty 1

## 2013-01-08 MED ORDER — POTASSIUM CHLORIDE 10 MEQ/100ML IV SOLN
10.0000 meq | INTRAVENOUS | Status: AC
  Administered 2013-01-08 (×2): 10 meq via INTRAVENOUS
  Filled 2013-01-08 (×2): qty 100

## 2013-01-08 MED ORDER — WARFARIN SODIUM 4 MG PO TABS
4.0000 mg | ORAL_TABLET | Freq: Once | ORAL | Status: AC
  Administered 2013-01-08: 4 mg via ORAL
  Filled 2013-01-08: qty 1

## 2013-01-08 MED ORDER — POTASSIUM CHLORIDE CRYS ER 20 MEQ PO TBCR
40.0000 meq | EXTENDED_RELEASE_TABLET | Freq: Once | ORAL | Status: AC
  Administered 2013-01-08: 40 meq via ORAL
  Filled 2013-01-08: qty 2

## 2013-01-08 MED ORDER — WARFARIN - PHARMACIST DOSING INPATIENT
Freq: Every day | Status: DC
  Administered 2013-01-09: 18:00:00

## 2013-01-08 MED ORDER — FUROSEMIDE 40 MG PO TABS
40.0000 mg | ORAL_TABLET | Freq: Every day | ORAL | Status: DC
  Administered 2013-01-08: 40 mg via ORAL
  Filled 2013-01-08 (×2): qty 1

## 2013-01-08 MED ORDER — ATORVASTATIN CALCIUM 80 MG PO TABS
80.0000 mg | ORAL_TABLET | Freq: Every day | ORAL | Status: DC
  Administered 2013-01-08 – 2013-01-10 (×3): 80 mg via ORAL
  Filled 2013-01-08 (×4): qty 1

## 2013-01-08 NOTE — Progress Notes (Signed)
K-3.2 result relayed  To dr. Wynelle Cleveland, awaiting response.

## 2013-01-08 NOTE — Care Management Note (Signed)
    Page 1 of 2   01/08/2013     3:53:43 PM   CARE MANAGEMENT NOTE 01/08/2013  Patient:  Logan White, Logan White   Account Number:  [id number]  Date Initiated:  12/31/2012  Documentation initiated by:  Elissa Hefty  Subjective/Objective Assessment:   adm w cardiac arrest, vent     Action/Plan:   lives w wife   Anticipated DC Date:     Anticipated DC Plan:        DC Planning Services  CM consult      [facility] Choice  HOME HEALTH   Choice offered to / List presented to:  C-1 Patient        [place] arranged  [place] PT      [place].   Status of service:   Medicare Important Message given?   (If response is "NO", the following Medicare IM given date fields will be blank) Date Medicare IM given:   Date Additional Medicare IM given:    Discharge Disposition:  [place]  Per UR Regulation:  Reviewed for med. necessity/level of care/duration of stay  If discussed at [place] of Stay Meetings, dates discussed:   01/08/2013    Comments:  5/6  1552 debbie Thatiana Renbarger rn,bsn spoke w pt. hx of adv homecare in past and no pref to hhc agency. ref to donna w [facility] for hhpt. phy ther rec hhpt. pt states md mentioned will need lifevest. lifevest form placed in shadowchart for md to sign.  4/28 1041a debbie Natasja Niday rn,bsn  01-03-13 3:43pm [place] [id number] [place] on vent and pressors - oliguria - Renal consulting for HD.

## 2013-01-08 NOTE — Progress Notes (Signed)
[facility] for heparin / Coumadin  Indication: atrial fibrillation  No Known Allergies  Patient Measurements: Height: 6\' 2"  (188 cm) Weight: 169 lb 8.5 oz (76.9 kg) IBW/kg (Calculated) : 82.2 Heparin Dosing Weight: 80kg  Vital Signs: Temp: 97.6 F (36.4 C) (05/06 0427) Temp src: Oral (05/06 0427) BP: 122/76 mmHg (05/06 0427) Pulse Rate: 106 (05/06 0427)  Labs:  Recent Labs  01/06/13 0809  01/07/13 0415 01/07/13 1353 01/08/13 0520  HGB 14.5  --  15.8  --   --   HCT 38.7*  --  41.1  --   --   PLT 159  --  168  --   --   LABPROT 17.3*  --   --   --   --   INR 1.46  --   --   --   --   HEPARINUNFRC 0.28*  < > <0.10* 0.30 0.31  CREATININE 2.34*  --  2.50*  --  2.31*  < > = values in this interval not displayed.  Estimated Creatinine Clearance: 32.8 ml/min (by C-G formula based on Cr of 2.31).  Assessment: 72 year old male with h/o afib for heparin. S/p VT/VF arrest s/p hypothermia protocol.  Patient does not qualify for ICD at this time. Swallowing ability improving, will need long term anticoagulation, will continue to follow.  Heparin level (0.31) is at-goal on 1500 units/hr.   Pharmacy consulted to now restart Coumadin. INR on 5/4 of 1.46. Prior to admission Coumadin regimen of 2.5mg  daily, except 1.25mg  on Monday and Wednesday (per 12/05/12 anticoagulation note).   Goal of Therapy:  Heparin level 0.3-0.7 units/ml Monitor platelets by anticoagulation protocol: Yes   Plan:  1. Continue IV heparin at 1500 units/hr. 2. Daily CBC, heparin level  3. Coumadin 4mg  po today. 4. Daily PT / INR  Raye Sorrow, PharmD 01/08/2013 7:21 AM

## 2013-01-08 NOTE — Progress Notes (Signed)
Hypokalemia   Kcl 2 runs ordered through the periphery IV.

## 2013-01-08 NOTE — Evaluation (Signed)
Physical Therapy Evaluation Patient Details Name: Logan White MRN: [id number] DOB: Nov 28, 1940 Today's Date: 01/08/2013 Time: 0900-0920 PT Time Calculation (min): 20 min  PT Assessment / Plan / Recommendation Clinical Impression  Pt is a 72 yo male admitted for cardiac arrest s/p 19 min of CPR. Pt presenting with balance impairements, mild cognitive deficits and decreased safety awareness in addition to increased HR and RR with minimal activity. Anticipate pt to be able to return home with 24/7 supervision and HHPT once medicall safe. Acute PT to follow to address mentioned deficits and achieve safe mod I function to decrease burden of care on family.    PT Assessment  Patient needs continued PT services    Follow Up Recommendations  Home health PT;Supervision/Assistance - 24 hour    Does the patient have the potential to tolerate intense rehabilitation      Barriers to Discharge None      Equipment Recommendations  Rolling walker with 5" wheels    Recommendations for Other Services     Frequency Min 4X/week    Precautions / Restrictions Precautions Precautions: Fall Restrictions Weight Bearing Restrictions: No   Pertinent Vitals/Pain Pt denies pain.      Mobility  Bed Mobility Bed Mobility: Supine to Sit Supine to Sit: 5: Supervision;With rails;HOB elevated Details for Bed Mobility Assistance: increased time/effort required Transfers Transfers: Sit to Stand;Stand to Sit Sit to Stand: 4: Min assist;With upper extremity assist;From bed Stand to Sit: With armrests;With upper extremity assist;4: Min guard;To chair/3-in-1 Details for Transfer Assistance: pt unsteady once in standing, increased time Ambulation/Gait Ambulation/Gait Assistance: 3: Mod assist Ambulation Distance (Feet): 20 Feet Assistive device: 1 person hand held assist Ambulation/Gait Assistance Details: pt reached for objects to hold onto. pt with scissored, narrow base of support despite v/cs. Pt  with HR into 130 and RR into 30s to 44 however questionable accuracy due to pt asymptomatic and then found to have 1 lead off Gait Pattern: Step-through pattern;Scissoring Gait velocity: slow Stairs: No    Exercises     PT Diagnosis: Difficulty walking  PT Problem List: Decreased activity tolerance;Decreased balance;Decreased mobility PT Treatment Interventions: Gait training;Stair training;Functional mobility training;Therapeutic activities;Therapeutic exercise;Balance training   PT Goals Acute Rehab PT Goals PT Goal Formulation: With patient Time For Goal Achievement: 01/22/13 Potential to Achieve Goals: Good Pt will go Sit to Stand: with modified independence;with upper extremity assist (up to RW) PT Goal: Sit to Stand - Progress: Goal set today Pt will Stand: with modified independence;3 - 5 min;with no upper extremity support (for AM ADLs) PT Goal: Stand - Progress: Goal set today Pt will Ambulate: 51 - 150 feet;with supervision;with rolling walker PT Goal: Ambulate - Progress: Goal set today Pt will Go Up / Down Stairs: 3-5 stairs;with min assist PT Goal: Up/Down Stairs - Progress: Goal set today  Visit Information  Last PT Received On: 01/08/13 Assistance Needed: +1    Subjective Data  Subjective: Pt received supine in bed agreeable to PT   Prior Functioning  Home Living Lives With: Spouse Available Help at Discharge: Family;Available 24 hours/day (works part time but is now on leave) Type of Home: House Home Access: Stairs to enter [employer] of Steps: 3-4 Entrance Stairs-Rails: None Home Layout: One level Bathroom Shower/Tub: Chiropodist: Standard Home Adaptive Equipment: None Prior Function Level of Independence: Independent Able to Take Stairs?: Yes Driving: Yes Vocation: Retired Corporate investment banker: No difficulties Dominant Hand: Right    Cognition  Cognition Arousal/Alertness: Awake/alert Behavior  During  Therapy: WFL for tasks assessed/performed Overall Cognitive Status: Impaired/Different from baseline Area of Impairment: Safety/judgement;Awareness;Attention Current Attention Level: Sustained Safety/Judgement: Decreased awareness of deficits;Decreased awareness of safety Awareness: Intellectual General Comments: pt with delayed processing and response time    Extremity/Trunk Assessment Right Upper Extremity Assessment RUE ROM/Strength/Tone: Within functional levels RUE Sensation: WFL - Light Touch RUE Coordination: WFL - gross/fine motor Left Upper Extremity Assessment LUE ROM/Strength/Tone: Within functional levels LUE Sensation: WFL - Light Touch Right Lower Extremity Assessment RLE ROM/Strength/Tone: Within functional levels RLE Sensation: WFL - Light Touch RLE Coordination: WFL - gross/fine motor Left Lower Extremity Assessment LLE ROM/Strength/Tone: Within functional levels LLE Sensation: WFL - Light Touch LLE Coordination: WFL - gross/fine motor Trunk Assessment Trunk Assessment: Normal   Balance Balance Balance Assessed: Yes Static Sitting Balance Static Sitting - Balance Support: No upper extremity supported Static Sitting - Level of Assistance: 6: Modified independent (Device/Increase time) Static Sitting - Comment/# of Minutes: 5 min Static Standing Balance Static Standing - Balance Support: Left upper extremity supported Static Standing - Level of Assistance: 4: Min assist Static Standing - Comment/# of Minutes: 1 min  End of Session PT - End of Session Equipment Utilized During Treatment: Gait belt Activity Tolerance: Patient tolerated treatment well (limited by inc HR and RR ) Patient left: with call bell/phone within reach;in chair Nurse Communication: Mobility status  GP     Kingsley Callander 01/08/2013, 10:21 AM  Kittie Plater, PT, DPT Pager #: [phone] Office #: [phone]

## 2013-01-08 NOTE — Progress Notes (Signed)
Patient: Logan White Date of Encounter: 01/08/2013, 6:51 AM Admit date: 12/30/2012     Subjective  Patient denies CP or dyspnea   Objective  Physical Exam: Vitals: BP 122/76  Pulse 106  Temp(Src) 97.6 F (36.4 C) (Oral)  Resp 30  Ht 6\' 2"  (1.88 m)  Wt 169 lb 8.5 oz (76.9 kg)  BMI 21.76 kg/m2  SpO2 95% General: Well developed, WN, NAD HEENT: Normal Neck: Supple Lungs: Diffuse rhonchi Heart: Irregularly irregular S1 S2 without murmur, rub or gallop appreciated  Abdomen/GU: Soft, non-distended Extremities: No edema Neuro: grossly intact  Intake/Output:  Intake/Output Summary (Last 24 hours) at 01/08/13 0651 Last data filed at 01/08/13 0600  Gross per 24 hour  Intake 2797.81 ml  Output   1779 ml  Net 1018.81 ml    Inpatient Medications:  . antiseptic oral rinse  15 mL Mouth Rinse QID  . aspirin  81 mg Per NG tube Daily  . clopidogrel  75 mg Per NG tube Q breakfast  . furosemide  40 mg Intravenous Q12H   . sodium chloride 20 mL/hr at 01/05/13 0401  . sodium chloride 20 mL/hr at 01/07/13 0800  . amiodarone (NEXTERONE PREMIX) 360 mg/200 mL dextrose 30 mg/hr (01/07/13 2003)  . heparin 1,500 Units/hr (01/07/13 2002)    Labs:  Recent Labs  01/06/13 0809 01/07/13 0415  NA 138 143  K 3.4* 3.7  CL 91* 96  CO2 30 24  GLUCOSE 248* 86  BUN 60* 66*  CREATININE 2.34* 2.50*  CALCIUM 8.8 9.6  PHOS 4.2 3.9     Recent Labs  01/06/13 0809 01/07/13 0415  WBC 10.5 13.7*  HGB 14.5 15.8  HCT 38.7* 41.1  MCV 74.0* 73.9*  PLT 159 168    Radiology/Studies: Ct Head Wo Contrast  01/02/2013  *RADIOLOGY REPORT*  Clinical Data: Cardiac arrest, rule out anoxic brain injury.  On Coumadin  CT HEAD WITHOUT CONTRAST  Technique:  Contiguous axial images were obtained from the base of the skull through the vertex without contrast.  Comparison: CT head 01/07/2007  Findings: Ventricle size is normal.  Negative for acute infarct. Mild chronic microvascular ischemic changes in  the white matter. No evidence of cortical edema.  Pearline Cables and white matter differentiation is maintained.  Negative for hemorrhage or mass.  No acute infarct or edema.  No shift to the midline structures.  Mucosal edema in the paranasal sinuses.  Calcification in the cavernous carotid is mild.  No skull lesion.  Right frontal scalp lipoma over the convexities unchanged from the prior study and appears benign.  IMPRESSION: No acute intracranial abnormality.  If the patient continues to have symptoms of anoxic injury, follow-up CT or MRI is suggested.   Original Report Authenticated By: Carl Best, M.D.    Dg Chest Port 1 View  01/02/2013  *RADIOLOGY REPORT*  Clinical Data: Shortness of breath.  PORTABLE CHEST - 1 VIEW  Comparison: 01/01/2013.  Findings: Endotracheal tube tip 7.2 cm above the carina. Nasogastric tube tip gastric fundus.  Overlying leads noted.  Heart appears slightly prominent.  Calcified tortuous aorta.  Slight improved aeration right mid to lower lung zone.  Perihilar prominence may represent pulmonary vascular congestion/mild pulmonary edema.  Infectious infiltrate is a secondary less likely consideration.  IMPRESSION: Slight improved aeration right mid to lower lung zone.  Please see above.   Original Report Authenticated By: Genia Del, M.D.     Telemetry: continued A fib    Assessment and Plan  Mr.  Addario is a 72 year old man with PMH of ischemic cardiomyopathy, Atrial fib on warfarin, CVA, DM2, CKD, HTN, and previous cardiac arrest a year ago (2/2 severe hypokalemia) admitted with V Fib/V Tach arrest now s/p emergent cardiac cath and induced hypothermia.  1. Ventricular Fibrillation arrest:  Witnessed, s/p CPR prior to ED presentation, s/p induced hypothermia, telemetry Atrial fib -Dr Lovena Le has reviewed; plan life vest at DC; repeat echo in 3 months; if EF < 35, ICD.  2. STEMI: cardiac enzymes trending downwards, s/p emergent cardiac cath -cont aspirin and plavix, add lopressor  12.5 mg PO BID; add statin.   Recent Labs Lab 01/04/13 0917  TROPONINI 12.35*   3. Ischemic Cardiomyopathy: Echo EF55%>>30-35% with RV dysfunction, pt with extensive CAD hx -change lasix to 40 mg po daily -add beta blocker; no ACEI given renal insuff.; add hydralazine nitrates later as tolerated by BP.   4. Atrial fibrillation: cont Afib on telemetry; add lopressor; DC amiodarone. -pt on Heparin gtt, will need long term anticoag; begin coumadin.  5. Acute on chronic renal failure: likely secondary to ATN, creatinine looks to have peaked, Renal US w/o hydronephrosis, diffusely echogenic bilaterally c/w medical renal disease, UOP adequate, pt stable from renal aspect -Renal signed off  Recent Labs Lab 01/03/13 0432 01/04/13 0355 01/05/13 0419 01/06/13 0809 01/07/13 0415  CREATININE 3.01* 2.71* 2.26* 2.34* 2.50*   7. Anoxic brain injury: alert and responsive, MRI w/o anoxic findings, improving.   Signed, Kirk Ruths , MD

## 2013-01-08 NOTE — Progress Notes (Signed)
NUTRITION FOLLOW UP  Intervention:   Ensure Complete po BID, each supplement provides 350 kcal and 13 grams of protein.   Nutrition Dx:   Inadequate oral intake now related to poor appetite and dislike of foods provided as evidenced by poor meal completion  Goal:   Meet >/=90% estimated nutrition needs. Currently unmet   Monitor:   PO intake, weight trends, supplement tolerance, labs   Assessment:   Pt was extubated on 5/3, with improved mental status. SLP evaluated swallowing ability on 5/5, upgraded diet to dysphagia 2, thin liquids.  Pt does not like the lunch provided, basically untouched. States he does not have an appetite either. Does not remember his appetite or weight PTA. Is willing to try Ensure Complete, chocolate only.   Height: Ht Readings from Last 1 Encounters:  12/31/12 6\' 2"  (1.88 m)    Weight Status:   Wt Readings from Last 1 Encounters:  01/06/13 169 lb 8.5 oz (76.9 kg)    Re-estimated needs:  Kcal: 1900-2100 Protein: 75-85 gm  Fluid: >/= 2 L   Skin: intact  Diet Order: Dysphagia 2, thin   Intake/Output Summary (Last 24 hours) at 01/08/13 1226 Last data filed at 01/08/13 1200  Gross per 24 hour  Intake 2820.31 ml  Output   1129 ml  Net 1691.31 ml    Last BM: 5/5    Labs:   Recent Labs Lab 01/04/13 0355 01/05/13 0419 01/06/13 0809 01/07/13 0415 01/08/13 0520  NA 139 137 138 143 139  K 3.5 3.3* 3.4* 3.7 2.8*  CL 95* 93* 91* 96 90*  CO2 29 32 30 24 35*  BUN 56* 58* 60* 66* 58*  CREATININE 2.71* 2.26* 2.34* 2.50* 2.31*  CALCIUM 8.3* 9.2 8.8 9.6 9.6  MG 1.6 1.9  --   --  1.7  PHOS 4.6 3.8 4.2 3.9 3.4  GLUCOSE 161* 132* 248* 86 103*    CBG (last 3)  No results found for this basename: GLUCAP,  in the last 72 hours  Scheduled Meds: . antiseptic oral rinse  15 mL Mouth Rinse QID  . aspirin  81 mg Per NG tube Daily  . atorvastatin  80 mg Oral q1800  . clopidogrel  75 mg Per NG tube Q breakfast  . furosemide  40 mg Oral Daily   . magnesium sulfate 1 - 4 g bolus IVPB  2 g Intravenous Once  . metoprolol tartrate  12.5 mg Oral BID  . warfarin  4 mg Oral ONCE-1800  . Warfarin - Pharmacist Dosing Inpatient   Does not apply q1800    Continuous Infusions: . sodium chloride 20 mL/hr at 01/05/13 0401  . sodium chloride 20 mL/hr at 01/07/13 0800  . heparin 1,500 Units/hr (01/07/13 2002)    Orson Slick RD, LDN Pager [phone] After Hours pager [phone]

## 2013-01-08 NOTE — Progress Notes (Signed)
[facility] Progress Note [place] TEAM 1 - Stepdown/ICU TEAM   Logan White [id number] DOB: May 13, 1941 DOA: 12/30/2012 PCP: No primary provider on file.  Brief narrative: 72 y/o male with a h/o ischemic cardiomyopathy with DES to LAD, PAF, carotid artery stenosis, DM, admitted for v- fib arrest in June2013 thought to be due to hypokalemia. EF was 25-30% but improved to 50-55%. Admitted on 4/27 with V-fib arrest to the ICU service.   SIGNIFICANT EVENTS / STUDIES:  4/27 VT/VT cardiac arrest, hypothermia protocol initiated  4/28 Cardiac cath - no culprit new lesions  5/1 EEG >>> Severe encephalopathy  5/2 Brain MRI >>> No acute infarct, no edema  5/3 Extubated  Assessment/Plan: Principal Problem:   Cardiac arrest/ V-fib/ STEMI  - - Cardiology following.  - Aspirin / Plavix.  - ACEI contraindicated -  AKI.  - Amiodarone discontinued- started on Lopressor today - Cardiology recommending life vest for 30 days- repeat ECHO afterwards to assess EF and possible AICD if EF not improved.   Active Problems:  PAF - coumadin resumed by cardiology - on Heparin infusion as well - Lopressor added    Abnormal LFTs - due to cardiac arrest - repeat in AM    Acute on chronic renal insufficiency (CKD 3) - baseline Cr appears to be 1.7 - hold off on ACE I and other offending agents    Chronic anticoagulation - cont coumadin and heparin per cardiology    Hypokalemia - replaced - will replace mag as well today as it is borderline low    Code Status: full code Family Communication: with wife Disposition Plan: transfer to tele when OK with cardiology  Consultants: cardiology  Procedures: Cardiac Cath- 4/28 EEG- 5/1  DVT prophylaxis: Heparin infusion  HPI/Subjective: Pt alert, sitting in chair, no complaints of dyspnea, chest pain or palpitations.    Objective: Blood pressure 106/67, pulse 100, temperature 98.1 F (36.7 C), temperature source Oral, resp.  rate 29, height 6\' 2"  (1.88 m), weight 76.9 kg (169 lb 8.5 oz), SpO2 98.00%.  Intake/Output Summary (Last 24 hours) at 01/08/13 1033 Last data filed at 01/08/13 0854  Gross per 24 hour  Intake 2781.71 ml  Output   1429 ml  Net 1352.71 ml     Exam: General: No acute respiratory distress Lungs: Clear to auscultation bilaterally without wheezes or crackles Cardiovascular: Irregular rate and rhythm without murmur gallop or rub normal S1 and S2 Abdomen: Nontender, nondistended, soft, bowel sounds positive, no rebound, no ascites, no appreciable mass Extremities: No significant cyanosis, clubbing, or edema bilateral lower extremities  Data Reviewed: Basic Metabolic Panel:  Recent Labs Lab 01/02/13 0420 01/03/13 0432 01/04/13 0355 01/05/13 0419 01/06/13 0809 01/07/13 0415 01/08/13 0520  NA 141 138 139 137 138 143 139  K 5.2* 4.7 3.5 3.3* 3.4* 3.7 2.8*  CL 102 97 95* 93* 91* 96 90*  CO2 24 27 29  32 30 24 35*  GLUCOSE 107* 131* 161* 132* 248* 86 103*  BUN 42* 52* 56* 58* 60* 66* 58*  CREATININE 2.55* 3.01* 2.71* 2.26* 2.34* 2.50* 2.31*  CALCIUM 7.6* 7.4* 8.3* 9.2 8.8 9.6 9.6  MG 1.7 1.6 1.6 1.9  --   --  1.7  PHOS 5.8* 7.6* 4.6 3.8 4.2 3.9 3.4   Liver Function Tests:  Recent Labs Lab 01/06/13 0809 01/07/13 0415 01/08/13 0520  ALBUMIN 2.0* 2.2* 2.6*   No results found for this basename: LIPASE, AMYLASE,  in the last 168 hours No results found for  this basename: AMMONIA,  in the last 168 hours CBC:  Recent Labs Lab 01/04/13 0355 01/05/13 0419 01/06/13 0809 01/07/13 0415 01/08/13 0520  WBC 4.6 6.8 10.5 13.7* 18.6*  HGB 14.6 14.2 14.5 15.8 15.7  HCT 38.8* 37.1* 38.7* 41.1 40.7  MCV 73.8* 73.3* 74.0* 73.9* 72.4*  PLT 132* 139* 159 168 PLATELET CLUMPS NOTED ON SMEAR, UNABLE TO ESTIMATE   Cardiac Enzymes:  Recent Labs Lab 01/04/13 0917  TROPONINI 12.35*   BNP (last 3 results)  Recent Labs  03/06/12 0430 03/11/12 0858 04/23/12 0939  PROBNP 25835.0*  39057.0* 1756.0*   CBG:  Recent Labs Lab 01/04/13 1647 01/04/13 2004 01/04/13 2315 01/05/13 0358 01/05/13 0722  GLUCAP 111* 119* 129* 114* 151*    Recent Results (from the past 240 hour(s))  MRSA PCR SCREENING     Status: Abnormal   Collection Time    12/31/12  2:30 AM      Result Value Range Status   MRSA by PCR POSITIVE (*) NEGATIVE Final   Comment:            The GeneXpert MRSA Assay (FDA     approved for NASAL specimens     only), is one component of a     comprehensive MRSA colonization     surveillance program. It is not     intended to diagnose MRSA     infection nor to guide or     monitor treatment for     MRSA infections.     RESULT CALLED TO, READ BACK BY AND VERIFIED WITH:     STOWE,S RN [id number] AT [phone] SKEEN,P     Studies:  Recent x-ray studies have been reviewed in detail by the Attending Physician  Scheduled Meds:  Scheduled Meds: . antiseptic oral rinse  15 mL Mouth Rinse QID  . aspirin  81 mg Per NG tube Daily  . atorvastatin  80 mg Oral q1800  . clopidogrel  75 mg Per NG tube Q breakfast  . furosemide  40 mg Oral Daily  . metoprolol tartrate  12.5 mg Oral BID  . warfarin  4 mg Oral ONCE-1800  . Warfarin - Pharmacist Dosing Inpatient   Does not apply q1800   Continuous Infusions: . sodium chloride 20 mL/hr at 01/05/13 0401  . sodium chloride 20 mL/hr at 01/07/13 0800  . heparin 1,500 Units/hr (01/07/13 2002)    Time spent on care of this patient: 78 min   Debbe Odea, [place]  [facility] Office  [phone] Pager - Text Page per Amion as per below:  On-Call/Text Page:      [email or web address]      password [email or web address]  If 7PM-7AM, please contact night-coverage [email or web address] Password [email or web address] 01/08/2013, 10:33 AM   LOS: 9 days

## 2013-01-09 LAB — URINALYSIS, ROUTINE W REFLEX MICROSCOPIC
Leukocytes, UA: NEGATIVE
Nitrite: NEGATIVE
Protein, ur: NEGATIVE mg/dL
Specific Gravity, Urine: 1.017 (ref 1.005–1.030)
Urobilinogen, UA: 2 mg/dL — ABNORMAL HIGH (ref 0.0–1.0)

## 2013-01-09 LAB — COMPREHENSIVE METABOLIC PANEL
BUN: 44 mg/dL — ABNORMAL HIGH (ref 6–23)
CO2: 33 mEq/L — ABNORMAL HIGH (ref 19–32)
Calcium: 9.6 mg/dL (ref 8.4–10.5)
Chloride: 93 mEq/L — ABNORMAL LOW (ref 96–112)
Creatinine, Ser: 1.95 mg/dL — ABNORMAL HIGH (ref 0.50–1.35)
GFR calc Af Amer: 39 mL/min — ABNORMAL LOW (ref >90)
GFR calc non Af Amer: 33 mL/min — ABNORMAL LOW (ref >90)
Total Bilirubin: 1.5 mg/dL — ABNORMAL HIGH (ref 0.3–1.2)

## 2013-01-09 LAB — HEPARIN LEVEL (UNFRACTIONATED): Heparin Unfractionated: 0.41 IU/mL (ref 0.30–0.70)

## 2013-01-09 LAB — BASIC METABOLIC PANEL
BUN: 41 mg/dL — ABNORMAL HIGH (ref 6–23)
CO2: 31 mEq/L (ref 19–32)
Chloride: 93 mEq/L — ABNORMAL LOW (ref 96–112)
Glucose, Bld: 115 mg/dL — ABNORMAL HIGH (ref 70–99)
Potassium: 3.8 mEq/L (ref 3.5–5.1)
Sodium: 134 mEq/L — ABNORMAL LOW (ref 135–145)

## 2013-01-09 LAB — CBC
HCT: 40.9 % (ref 39.0–52.0)
Hemoglobin: 15.2 g/dL (ref 13.0–17.0)
MCH: 27.5 pg (ref 26.0–34.0)
MCHC: 37.2 g/dL — ABNORMAL HIGH (ref 30.0–36.0)
RDW: 14.1 % (ref 11.5–15.5)

## 2013-01-09 LAB — URINE MICROSCOPIC-ADD ON

## 2013-01-09 MED ORDER — FUROSEMIDE 20 MG PO TABS
20.0000 mg | ORAL_TABLET | Freq: Every day | ORAL | Status: DC
  Administered 2013-01-09 – 2013-01-11 (×3): 20 mg via ORAL
  Filled 2013-01-09 (×3): qty 1

## 2013-01-09 MED ORDER — METOPROLOL TARTRATE 25 MG PO TABS
25.0000 mg | ORAL_TABLET | Freq: Two times a day (BID) | ORAL | Status: DC
  Administered 2013-01-09 (×2): 25 mg via ORAL
  Filled 2013-01-09 (×3): qty 1

## 2013-01-09 MED ORDER — WARFARIN SODIUM 4 MG PO TABS
4.0000 mg | ORAL_TABLET | Freq: Once | ORAL | Status: AC
  Administered 2013-01-09: 4 mg via ORAL
  Filled 2013-01-09: qty 1

## 2013-01-09 MED ORDER — PANTOPRAZOLE SODIUM 40 MG PO TBEC
40.0000 mg | DELAYED_RELEASE_TABLET | Freq: Every day | ORAL | Status: DC
  Administered 2013-01-09 – 2013-01-11 (×3): 40 mg via ORAL
  Filled 2013-01-09 (×3): qty 1

## 2013-01-09 MED ORDER — POTASSIUM CHLORIDE 10 MEQ/100ML IV SOLN
10.0000 meq | INTRAVENOUS | Status: DC
  Administered 2013-01-09: 10 meq via INTRAVENOUS
  Filled 2013-01-09: qty 100

## 2013-01-09 MED ORDER — POTASSIUM CHLORIDE CRYS ER 20 MEQ PO TBCR
40.0000 meq | EXTENDED_RELEASE_TABLET | Freq: Once | ORAL | Status: AC
  Administered 2013-01-09: 40 meq via ORAL
  Filled 2013-01-09: qty 2

## 2013-01-09 NOTE — Progress Notes (Signed)
Patient: Logan White Date of Encounter: 01/09/2013, 7:31 AM Admit date: 12/30/2012     Subjective  Patient denies CP or dyspnea   Objective  Physical Exam: Vitals: BP 105/36  Pulse 101  Temp(Src) 98.6 F (37 C) (Oral)  Resp 31  Ht 6\' 2"  (1.88 m)  Wt 169 lb 15.6 oz (77.1 kg)  BMI 21.81 kg/m2  SpO2 99% General: Well developed, WN, NAD HEENT: Normal Neck: Supple Lungs: CTA Heart: Irregularly irregular S1 S2 without murmur, rub or gallop appreciated  Abdomen/GU: Soft, non-distended Extremities: No edema Neuro: grossly intact  Intake/Output:  Intake/Output Summary (Last 24 hours) at 01/09/13 0731 Last data filed at 01/09/13 0700  Gross per 24 hour  Intake   1235 ml  Output    903 ml  Net    332 ml    Inpatient Medications:  . antiseptic oral rinse  15 mL Mouth Rinse QID  . aspirin  81 mg Per NG tube Daily  . atorvastatin  80 mg Oral q1800  . clopidogrel  75 mg Per NG tube Q breakfast  . feeding supplement  237 mL Oral BID BM  . furosemide  40 mg Oral Daily  . metoprolol tartrate  12.5 mg Oral BID  . Warfarin - Pharmacist Dosing Inpatient   Does not apply q1800   . sodium chloride 20 mL/hr at 01/05/13 0401  . sodium chloride 5 mL/hr at 01/08/13 2300  . heparin 1,500 Units/hr (01/09/13 0326)    Labs:  Recent Labs  01/07/13 0415 01/08/13 0520 01/08/13 1435  NA 143 139  --   K 3.7 2.8* 3.2*  CL 96 90*  --   CO2 24 35*  --   GLUCOSE 86 103*  --   BUN 66* 58*  --   CREATININE 2.50* 2.31*  --   CALCIUM 9.6 9.6  --   MG  --  1.7  --   PHOS 3.9 3.4  --      Recent Labs  01/07/13 0415 01/08/13 0520  WBC 13.7* 18.6*  HGB 15.8 15.7  HCT 41.1 40.7  MCV 73.9* 72.4*  PLT 168 PLATELET CLUMPS NOTED ON SMEAR, UNABLE TO ESTIMATE    Radiology/Studies: Ct Head Wo Contrast  01/02/2013  *RADIOLOGY REPORT*  Clinical Data: Cardiac arrest, rule out anoxic brain injury.  On Coumadin  CT HEAD WITHOUT CONTRAST  Technique:  Contiguous axial images were obtained  from the base of the skull through the vertex without contrast.  Comparison: CT head 01/07/2007  Findings: Ventricle size is normal.  Negative for acute infarct. Mild chronic microvascular ischemic changes in the white matter. No evidence of cortical edema.  Pearline Cables and white matter differentiation is maintained.  Negative for hemorrhage or mass.  No acute infarct or edema.  No shift to the midline structures.  Mucosal edema in the paranasal sinuses.  Calcification in the cavernous carotid is mild.  No skull lesion.  Right frontal scalp lipoma over the convexities unchanged from the prior study and appears benign.  IMPRESSION: No acute intracranial abnormality.  If the patient continues to have symptoms of anoxic injury, follow-up CT or MRI is suggested.   Original Report Authenticated By: Carl Best, M.D.    Dg Chest Port 1 View  01/02/2013  *RADIOLOGY REPORT*  Clinical Data: Shortness of breath.  PORTABLE CHEST - 1 VIEW  Comparison: 01/01/2013.  Findings: Endotracheal tube tip 7.2 cm above the carina. Nasogastric tube tip gastric fundus.  Overlying leads noted.  Heart appears  slightly prominent.  Calcified tortuous aorta.  Slight improved aeration right mid to lower lung zone.  Perihilar prominence may represent pulmonary vascular congestion/mild pulmonary edema.  Infectious infiltrate is a secondary less likely consideration.  IMPRESSION: Slight improved aeration right mid to lower lung zone.  Please see above.   Original Report Authenticated By: Genia Del, M.D.     Telemetry: continued A fib    Assessment and Plan  Logan White is a 72 year old man with PMH of ischemic cardiomyopathy, Atrial fib on warfarin, CVA, DM2, CKD, HTN, and previous cardiac arrest a year ago (2/2 severe hypokalemia) admitted with V Fib/V Tach arrest now s/p emergent cardiac cath and induced hypothermia.  1. Ventricular Fibrillation arrest:  Witnessed, s/p CPR prior to ED presentation, s/p induced hypothermia, telemetry Atrial  fib -Dr Lovena Le has reviewed; plan life vest at DC; repeat echo in 3 months; if EF < 35, ICD.  2. STEMI: cardiac enzymes trending downwards, s/p emergent cardiac cath -cont aspirin, plavix, lopressor and statin.   Recent Labs Lab 01/04/13 0917  TROPONINI 12.35*   3. Ischemic Cardiomyopathy: Echo EF55%>>30-35% with RV dysfunction, pt with extensive CAD hx -change lasix to 20 mg po daily -continue beta blocker; no ACEI given renal insuff.; add hydralazine nitrates later as tolerated by BP.   4. Atrial fibrillation: cont Afib on telemetry (HR increased); change lopressor to 25 mg po BID. -pt on Heparin gtt, will need long term anticoag; continue coumadin.  5. Acute on chronic renal failure: likely secondary to ATN, creatinine looks to have peaked, Renal US w/o hydronephrosis, diffusely echogenic bilaterally c/w medical renal disease, UOP adequate, pt stable from renal aspect -Renal signed off Recheck BMET in AM  Recent Labs Lab 01/04/13 0355 01/05/13 0419 01/06/13 0809 01/07/13 0415 01/08/13 0520  CREATININE 2.71* 2.26* 2.34* 2.50* 2.31*   7. Anoxic brain injury: alert and responsive, MRI w/o anoxic findings, improved.  8. Increasing WBC: check UA; otherwise per primary care.  9. Hypokalemia: recheck and supplement as needed.  Patient can be transferred to telemetry from a cardiac standpoint Signed, Kirk Ruths , MD

## 2013-01-09 NOTE — Progress Notes (Signed)
Physical Therapy Treatment Patient Details Name: Logan White MRN: [id number] DOB: [birth year] Today's Date: 01/09/2013 Time: [id number] PT Time Calculation (min): 24 min  PT Assessment / Plan / Recommendation Comments on Treatment Session  Pt is a 72 yo male who presented with cardiac arrest. Pt much more alert today and safely ambulating with a RW with min gaurd-minA. Pt continues to have decreased endurance and generalized weakness. Pt requires 24/7 supervision initially to safely retun home and would benefit from HHPT to address balance deficits, weakness and endurance. Pt's spouse agreeable to providing 24/7 supervision.    Follow Up Recommendations  Home health PT;Supervision/Assistance - 24 hour     Does the patient have the potential to tolerate intense rehabilitation     Barriers to Discharge        Equipment Recommendations  Rolling walker with 5" wheels (3-in-1 chair to raise commode height)    Recommendations for Other Services    Frequency Min 4X/week   Plan Discharge plan remains appropriate    Precautions / Restrictions Precautions Precautions: Fall Restrictions Weight Bearing Restrictions: No   Pertinent Vitals/Pain  Pt denies pain    Mobility  Bed Mobility Bed Mobility: Supine to Sit;Sitting - Scoot to Edge of Bed Supine to Sit: HOB flat;6: Modified independent (Device/Increase time) (increased time) Sitting - Scoot to Edge of Bed: 5: Supervision (v/cs for hand placement and sequencing) Details for Bed Mobility Assistance: Increased time Transfers Transfers: Sit to Stand;Stand to Sit Sit to Stand: 5: Supervision;From bed Stand to Sit: 5: Supervision;To chair/3-in-1;With armrests;With upper extremity assist (w/ v/c's for sequencing and safe transfer techniques) Details for Transfer Assistance: pt able to stand with supervision from elevated bed-hips flexed to 90 Ambulation/Gait Ambulation/Gait Assistance: 4: Min guard (with min assist to regain  balance) Ambulation Distance (Feet): 250 Feet Assistive device: Rolling walker (It initially w/o RW~20 ft; pt w/ 2 instances LOB; used RW) Ambulation/Gait Assistance Details: Pt reaches for objects to hold onto w/o RW, pt scissoring and w/ 2 instances of LOB. With RW, pt continues to scissor but is contact gaurd- no LOB Gait Pattern: Step-through pattern;Scissoring Gait velocity: slow    Exercises     PT Diagnosis:    PT Problem List:   PT Treatment Interventions:     PT Goals Acute Rehab PT Goals PT Goal Formulation: With patient Time For Goal Achievement: 01/22/13 PT Goal: Sit to Stand - Progress: Progressing toward goal PT Goal: Stand - Progress: Progressing toward goal PT Goal: Ambulate - Progress: Progressing toward goal  Visit Information  Last PT Received On: 01/09/13 Assistance Needed: +1    Subjective Data  Subjective: Pt received supine in bed agreeable to PT. Pt reports he does not in fact have steps at home and his wife is taking FMLA to be with him at home. Wife present   Cognition  Cognition Arousal/Alertness: Awake/alert Behavior During Therapy: WFL for tasks assessed/performed Current Attention Level: Sustained General Comments: Pt more alert in therapy today. Decreased response time    Balance  Static Standing Balance Static Standing - Balance Support: Bilateral upper extremity supported Static Standing - Level of Assistance: 6: Modified independent (Device/Increase time) (RW) Static Standing - Comment/# of Minutes: Pt with good static balance for 2 mins with B/L hands on walker  End of Session PT - End of Session Equipment Utilized During Treatment: Gait belt Activity Tolerance: Patient tolerated treatment well Patient left: with call bell/phone within reach;in chair;with family/visitor present Nurse Communication: Mobility status (equipment for d/c  home)   GP     01/09/2013, 1:30 PM Donnald Garre, Student Physical Therapist Office #:  [phone]   Kittie Plater, PT, DPT Pager #: [phone] Office #: [phone]

## 2013-01-09 NOTE — Progress Notes (Signed)
Speech Language Pathology Dysphagia Treatment Patient Details Name: Logan White MRN: [id number] DOB: 31-May-1941 Today's Date: 01/09/2013 Time: 0900-0910 SLP Time Calculation (min): 10 min  Assessment / Plan / Recommendation Clinical Impression  Skilled dysphagia treatment included observation of upgraded texture (Dys 3) to determine ability to recommend upgrade.  Pt. stated he has upper denture plate but only wears when he leaves his house and does not eat with them at home.  Pt. dislikes saltine crackers (no graham crackers available) but agreeable to consume.  Prep, mastication and transit were delayed most likely with pt.'s dislike an affecting factor.  His reports a decreased appetite and "nothing tastes good."  Recommend upgrade diet texture to Dys 3 and continue thin.  SLP will see once more to ensure effeciency with uprgraded texture.    Diet Recommendation  Initiate / Change Diet: Dysphagia 3 (mechanical soft);Thin liquid    SLP Plan Continue with current plan of care   Pertinent Vitals/Pain none   Swallowing Goals  SLP Swallowing Goals Patient will utilize recommended strategies during swallow to increase swallowing safety with: Minimal cueing Goal #3: Pt will consume PO trials at bedside without overt s/s of aspiration to assess readiness for a PO diet. Swallow Study Goal #3 - Progress: Met  General Temperature Spikes Noted: No Respiratory Status: Room air Behavior/Cognition: Alert;Cooperative;Pleasant mood Oral Cavity - Dentition: Dentures, bottom Patient Positioning: Upright in bed  Oral Cavity - Oral Hygiene Does patient have any of the following "at risk" factors?: Nutritional status - inadequate Brush patient's teeth BID with toothbrush (using toothpaste with fluoride): Yes Patient is AT RISK - Oral Care Protocol followed (see row info): Yes   Dysphagia Treatment Treatment focused on: Skilled observation of diet tolerance Treatment Methods/Modalities: Skilled  observation Patient observed directly with PO's: Yes Type of PO's observed: Dysphagia 3 (soft);Thin liquids Feeding: Able to feed self Liquids provided via: Straw Oral Phase Signs & Symptoms: Prolonged mastication;Prolonged bolus formation;Prolonged oral phase Type of cueing: Verbal Amount of cueing: Minimal   GO     ls 01/09/2013

## 2013-01-09 NOTE — Progress Notes (Signed)
[facility] [place] TEAM 1 - Stepdown/ICU TEAM  After reviewing the patient's chart it appeared that the vast majority of active issues were being thoroughly addressed by the cardiology service.  I contacted [facility] cardiology and requested that they consider assuming the attending role for this patient.  They have graciously agreed to do so.  [facility] is therefore signing off.  Cherene Altes, MD [facility] Office  [phone] Pager [phone]  On-Call/Text Page:      [email or web address]      password [facility]

## 2013-01-09 NOTE — Progress Notes (Signed)
[place] for heparin / Coumadin  Indication: atrial fibrillation  No Known Allergies  Labs:  Recent Labs  01/07/13 0415 01/07/13 1353 01/08/13 0520 01/09/13 0810  HGB 15.8  --  15.7 15.2  HCT 41.1  --  40.7 40.9  PLT 168  --  PLATELET CLUMPS NOTED ON SMEAR, UNABLE TO ESTIMATE 240  LABPROT  --   --   --  17.5*  INR  --   --   --  1.48  HEPARINUNFRC <0.10* 0.30 0.31 0.41  CREATININE 2.50*  --  2.31* 1.95*    Estimated Creatinine Clearance: 39 ml/min (by C-G formula based on Cr of 1.95).  Assessment: 72 year old male with h/o afib for heparin. S/p VT/VF arrest s/p hypothermia protocol.  Heparin level (0.41) is at-goal on 1500 units/hr.   Pharmacy consulted to now restart Coumadin. INR on 5/4 of 1.46. Prior to admission Coumadin regimen of 2.5mg  daily, except 1.25mg  on Monday and Wednesday (per 12/05/12 anticoagulation note).   Goal of Therapy:  Heparin level 0.3-0.7 units/ml Monitor platelets by anticoagulation protocol: Yes   Plan:  1. Continue IV heparin at 1500 units/hr. 2. Daily CBC, heparin level  3. Coumadin 4mg  po today. 4. Daily PT / INR  Thank you. Anette Guarneri, PharmD [phone]  01/09/2013 9:58 AM

## 2013-01-09 NOTE — Progress Notes (Signed)
Report called to receiving Rn [id number]. Will transfer via [place] on monitor.

## 2013-01-09 NOTE — Progress Notes (Signed)
Physical Therapy Treatment Patient Details Name: Logan White MRN: [id number] DOB: 1941/04/27 Today's Date: 01/09/2013 Time: [id number] PT Time Calculation (min): 24 min  PT Assessment / Plan / Recommendation Comments on Treatment Session  Pt is a 72 yo male who presented with cardiac arrest. Pt much more alert today and safely ambulating with a RW with min gaurd-minA. Pt continues to have decreased endurance and generalized weakness. Pt requires 24/7 supervision initially to safely retun home and would benefit from HHPT to address balance deficits, weakness and endurance. Pt's spouse agreeable to providing 24/7 supervision.    Follow Up Recommendations  Home health PT;Supervision/Assistance - 24 hour     Does the patient have the potential to tolerate intense rehabilitation     Barriers to Discharge        Equipment Recommendations  Rolling walker with 5" wheels (3-in-1 chair to raise commode height)    Recommendations for Other Services    Frequency Min 4X/week   Plan Discharge plan remains appropriate    Precautions / Restrictions Precautions Precautions: Fall Restrictions Weight Bearing Restrictions: No   Pertinent Vitals/Pain  Pt denies pain    Mobility  Bed Mobility Bed Mobility: Supine to Sit;Sitting - Scoot to Edge of Bed Supine to Sit: HOB flat;6: Modified independent (Device/Increase time) (increased time) Sitting - Scoot to Edge of Bed: 5: Supervision (v/cs for hand placement and sequencing) Details for Bed Mobility Assistance: Increased time Transfers Transfers: Sit to Stand;Stand to Sit Sit to Stand: 5: Supervision;From bed Stand to Sit: 5: Supervision;To chair/3-in-1;With armrests;With upper extremity assist (w/ v/c's for sequencing and safe transfer techniques) Details for Transfer Assistance: pt able to stand with supervision from elevated bed-hips flexed to 90 Ambulation/Gait Ambulation/Gait Assistance: 4: Min guard (with min assist to regain  balance) Ambulation Distance (Feet): 250 Feet Assistive device: Rolling walker (It initially w/o RW~20 ft; pt w/ 2 instances LOB; used RW) Ambulation/Gait Assistance Details: Pt reaches for objects to hold onto w/o RW, pt scissoring and w/ 2 instances of LOB. With RW, pt continues to scissor but is contact gaurd- no LOB Gait Pattern: Step-through pattern;Scissoring Gait velocity: slow    Exercises     PT Diagnosis:    PT Problem List:   PT Treatment Interventions:     PT Goals Acute Rehab PT Goals PT Goal Formulation: With patient Time For Goal Achievement: 01/22/13 PT Goal: Sit to Stand - Progress: Progressing toward goal PT Goal: Stand - Progress: Progressing toward goal PT Goal: Ambulate - Progress: Progressing toward goal  Visit Information  Last PT Received On: 01/09/13 Assistance Needed: +1    Subjective Data  Subjective: Pt received supine in bed agreeable to PT. Pt reports he does not in fact have steps at home and his wife is taking FMLA to be with him at home. Wife present   Cognition  Cognition Arousal/Alertness: Awake/alert Behavior During Therapy: WFL for tasks assessed/performed Current Attention Level: Sustained General Comments: Pt more alert in therapy today. Decreased response time    Balance  Static Standing Balance Static Standing - Balance Support: Bilateral upper extremity supported Static Standing - Level of Assistance: 6: Modified independent (Device/Increase time) (RW) Static Standing - Comment/# of Minutes: Pt with good static balance for 2 mins with B/L hands on walker  End of Session PT - End of Session Equipment Utilized During Treatment: Gait belt Activity Tolerance: Patient tolerated treatment well Patient left: with call bell/phone within reach;in chair;with family/visitor present Nurse Communication: Mobility status (equipment for d/c  home)   GP     01/09/2013, 1:30 PM Donnald Garre, Student Physical Therapist Office #: [phone]

## 2013-01-10 ENCOUNTER — Inpatient Hospital Stay (HOSPITAL_COMMUNITY): Payer: Medicare Other

## 2013-01-10 LAB — BASIC METABOLIC PANEL
BUN: 36 mg/dL — ABNORMAL HIGH (ref 6–23)
Calcium: 8.8 mg/dL (ref 8.4–10.5)
Chloride: 96 mEq/L (ref 96–112)
Creatinine, Ser: 1.78 mg/dL — ABNORMAL HIGH (ref 0.50–1.35)
GFR calc Af Amer: 43 mL/min — ABNORMAL LOW (ref >90)

## 2013-01-10 LAB — CBC
HCT: 37.1 % — ABNORMAL LOW (ref 39.0–52.0)
MCHC: 36.4 g/dL — ABNORMAL HIGH (ref 30.0–36.0)
MCV: 74.5 fL — ABNORMAL LOW (ref 78.0–100.0)
Platelets: 289 10*3/uL (ref 150–400)
RDW: 14.1 % (ref 11.5–15.5)
WBC: 18 10*3/uL — ABNORMAL HIGH (ref 4.0–10.5)

## 2013-01-10 LAB — PROTIME-INR
INR: 1.83 — ABNORMAL HIGH (ref 0.00–1.49)
Prothrombin Time: 20.5 seconds — ABNORMAL HIGH (ref 11.6–15.2)

## 2013-01-10 MED ORDER — METOPROLOL SUCCINATE ER 50 MG PO TB24
75.0000 mg | ORAL_TABLET | Freq: Every day | ORAL | Status: DC
  Administered 2013-01-10 – 2013-01-11 (×2): 75 mg via ORAL
  Filled 2013-01-10 (×2): qty 1

## 2013-01-10 MED ORDER — WARFARIN SODIUM 4 MG PO TABS
4.0000 mg | ORAL_TABLET | Freq: Once | ORAL | Status: AC
  Administered 2013-01-10: 4 mg via ORAL
  Filled 2013-01-10: qty 1

## 2013-01-10 MED ORDER — SODIUM CHLORIDE 0.9 % IJ SOLN
3.0000 mL | Freq: Two times a day (BID) | INTRAMUSCULAR | Status: DC
  Administered 2013-01-10 – 2013-01-11 (×2): 3 mL via INTRAVENOUS

## 2013-01-10 NOTE — Progress Notes (Signed)
Patient: Logan White Date of Encounter: 01/10/2013, 7:33 AM Admit date: 12/30/2012     Subjective  Patient denies CP or dyspnea; mild cough (unchanged from home); no diarrhea, dysuria, abdominal pain   Objective  Physical Exam: Vitals: BP 115/59  Pulse 106  Temp(Src) 99.2 F (37.3 C) (Oral)  Resp 20  Ht 6\' 1"  (1.854 m)  Wt 166 lb 3.6 oz (75.4 kg)  BMI 21.94 kg/m2  SpO2 92% General: Well developed, WN, NAD HEENT: Normal Neck: Supple Lungs: CTA Heart: Irregularly irregular S1 S2 without murmur, rub or gallop appreciated  Abdomen/GU: Soft, non-distended; no RUQ tenderness Extremities: No edema; no evidence of infection at IV sites. Neuro: grossly intact  Intake/Output:  Intake/Output Summary (Last 24 hours) at 01/10/13 0733 Last data filed at 01/10/13 0610  Gross per 24 hour  Intake 1414.5 ml  Output   1425 ml  Net  -10.5 ml    Inpatient Medications:  . antiseptic oral rinse  15 mL Mouth Rinse QID  . aspirin  81 mg Per NG tube Daily  . atorvastatin  80 mg Oral q1800  . clopidogrel  75 mg Per NG tube Q breakfast  . feeding supplement  237 mL Oral BID BM  . furosemide  20 mg Oral Daily  . metoprolol tartrate  25 mg Oral BID  . pantoprazole  40 mg Oral Daily  . Warfarin - Pharmacist Dosing Inpatient   Does not apply q1800   . sodium chloride 20 mL/hr at 01/05/13 0401  . sodium chloride 5 mL/hr at 01/08/13 2300  . heparin 1,500 Units/hr (01/09/13 2016)    Labs:  Recent Labs  01/08/13 0520  01/09/13 0810 01/09/13 2004  NA 139  --  137 134*  K 2.8*  < > 3.0* 3.8  CL 90*  --  93* 93*  CO2 35*  --  33* 31  GLUCOSE 103*  --  127* 115*  BUN 58*  --  44* 41*  CREATININE 2.31*  --  1.95* 1.88*  CALCIUM 9.6  --  9.6 9.1  MG 1.7  --   --   --   PHOS 3.4  --   --   --   < > = values in this interval not displayed.   Recent Labs  01/08/13 0520 01/09/13 0810  WBC 18.6* 21.7*  HGB 15.7 15.2  HCT 40.7 40.9  MCV 72.4* 74.0*  PLT PLATELET CLUMPS NOTED ON  SMEAR, UNABLE TO ESTIMATE 240    Radiology/Studies: Ct Head Wo Contrast  01/02/2013  *RADIOLOGY REPORT*  Clinical Data: Cardiac arrest, rule out anoxic brain injury.  On Coumadin  CT HEAD WITHOUT CONTRAST  Technique:  Contiguous axial images were obtained from the base of the skull through the vertex without contrast.  Comparison: CT head 01/07/2007  Findings: Ventricle size is normal.  Negative for acute infarct. Mild chronic microvascular ischemic changes in the white matter. No evidence of cortical edema.  Pearline Cables and white matter differentiation is maintained.  Negative for hemorrhage or mass.  No acute infarct or edema.  No shift to the midline structures.  Mucosal edema in the paranasal sinuses.  Calcification in the cavernous carotid is mild.  No skull lesion.  Right frontal scalp lipoma over the convexities unchanged from the prior study and appears benign.  IMPRESSION: No acute intracranial abnormality.  If the patient continues to have symptoms of anoxic injury, follow-up CT or MRI is suggested.   Original Report Authenticated By: Carl Best, M.D.  Dg Chest Port 1 View  01/02/2013  *RADIOLOGY REPORT*  Clinical Data: Shortness of breath.  PORTABLE CHEST - 1 VIEW  Comparison: 01/01/2013.  Findings: Endotracheal tube tip 7.2 cm above the carina. Nasogastric tube tip gastric fundus.  Overlying leads noted.  Heart appears slightly prominent.  Calcified tortuous aorta.  Slight improved aeration right mid to lower lung zone.  Perihilar prominence may represent pulmonary vascular congestion/mild pulmonary edema.  Infectious infiltrate is a secondary less likely consideration.  IMPRESSION: Slight improved aeration right mid to lower lung zone.  Please see above.   Original Report Authenticated By: Genia Del, M.D.     Telemetry: continued A fib    Assessment and Plan  Mr. Szalay is a 72 year old man with PMH of ischemic cardiomyopathy, Atrial fib on warfarin, CVA, DM2, CKD, HTN, and previous  cardiac arrest a year ago (2/2 severe hypokalemia) admitted with V Fib/V Tach arrest now s/p emergent cardiac cath and induced hypothermia.  1. Ventricular Fibrillation arrest:  Witnessed, s/p CPR prior to ED presentation, s/p induced hypothermia, telemetry Atrial fib -Dr Lovena Le has reviewed; plan life vest at DC; repeat echo in 3 months; if EF < 35, ICD.  2. STEMI: cardiac enzymes trending downwards, s/p emergent cardiac cath -cont plavix, lopressor and statin; discussed with Dr Wilhemina Cash yesterday; previous PCI aborted; DC ASA (would like to avoid dual antiplt therapy given need for coumadin).  Recent Labs Lab 01/04/13 0917  TROPONINI 12.35*   3. Ischemic Cardiomyopathy: Echo EF55%>>30-35% with RV dysfunction, pt with extensive CAD hx -continue lasix 20 mg po daily -continue beta blocker (change to toprol); no ACEI given renal insuff.; add hydralazine nitrates later as tolerated by BP.   4. Atrial fibrillation: cont Afib on telemetry (HR increased); change lopressor to toprol 75 mg po daily. -pt on Heparin gtt, will need long term anticoag; continue coumadin.  5. Acute on chronic renal failure: likely secondary to ATN, creatinine continues to improve, Renal US w/o hydronephrosis, diffusely echogenic bilaterally c/w medical renal disease, UOP adequate, pt stable from renal aspect -Renal signed off [facility] in AM  Recent Labs Lab 01/06/13 0809 01/07/13 0415 01/08/13 0520 01/09/13 0810 01/09/13 2004  CREATININE 2.34* 2.50* 2.31* 1.95* 1.88*   7. Anoxic brain injury: alert and responsive, MRI w/o anoxic findings, improved.  8. Increasing WBC: no localizing signs/symptoms of infection; no evidence of infection at IV sites; UA neg; no RUQ tenderness on exam (cholelithiasis on previous ultrasound); afebrile; continue to follow; no antibiotics for now.  9. Hypokalemia: recheck and supplement as needed.  10. Elevated LFTs: most likely from recent arrest; follow.  Signed, Kirk Ruths , MD

## 2013-01-10 NOTE — Progress Notes (Signed)
Physical Therapy Treatment Patient Details Name: Logan White MRN: [id number] DOB: [birth year] Today's Date: 01/10/2013 Time: [id number] PT Time Calculation (min): 23 min  PT Assessment / Plan / Recommendation Comments on Treatment Session  Overall pt moving fairly well.  Mildly unsteady with ambulation but no overt LOB, & slightly impulsive.  Pt able to increase ambulation distance & tolerated well without any complaints.  Cont to recommend 24/7 (S) initially, HHPT, use of RW at d/c.      Follow Up Recommendations  Home health PT;Supervision/Assistance - 24 hour     Does the patient have the potential to tolerate intense rehabilitation     Barriers to Discharge        Equipment Recommendations  Rolling walker with 5" wheels    Recommendations for Other Services    Frequency Min 4X/week   Plan Discharge plan remains appropriate    Precautions / Restrictions Precautions Precautions: Fall Restrictions Weight Bearing Restrictions: No       Mobility  Bed Mobility Bed Mobility: Not assessed Transfers Transfers: Sit to Stand;Stand to Sit Sit to Stand: 5: Supervision;With upper extremity assist;From bed;From toilet Stand to Sit: 5: Supervision;With upper extremity assist;To bed;To toilet Details for Transfer Assistance: Cues for hand placement & to ensure balance before ambulating.   Ambulation/Gait Ambulation/Gait Assistance: 4: Min guard Ambulation Distance (Feet): 300 Feet Assistive device: Rolling walker Ambulation/Gait Assistance Details: Cues for safe use of RW-- pt taking 1 hand off at times while still ambulating.  Cues for body positioning inside RW & tall posture.   Gait Pattern: Step-through pattern;Narrow base of support General Gait Details: No LOB noted with use of RW Stairs: No Wheelchair Mobility Wheelchair Mobility: No     PT Goals Acute Rehab PT Goals Time For Goal Achievement: 01/22/13 Potential to Achieve Goals: Good Pt will go Sit to Stand: with  modified independence;with upper extremity assist PT Goal: Sit to Stand - Progress: Progressing toward goal Pt will Stand: with modified independence;3 - 5 min;with no upper extremity support PT Goal: Stand - Progress: Progressing toward goal Pt will Ambulate: 51 - 150 feet;with supervision;with rolling walker PT Goal: Ambulate - Progress: Progressing toward goal Pt will Go Up / Down Stairs: 3-5 stairs;with min assist  Visit Information  Last PT Received On: 01/10/13 Assistance Needed: +1    Subjective Data      Cognition  Cognition Arousal/Alertness: Awake/alert Behavior During Therapy: WFL for tasks assessed/performed Overall Cognitive Status: Impaired/Different from baseline Area of Impairment: Safety/judgement Safety/Judgement: Decreased awareness of safety General Comments: Pt slightly impulsive.     Balance     End of Session PT - End of Session Equipment Utilized During Treatment: Gait belt Activity Tolerance: Patient tolerated treatment well Patient left: Other (comment);with nursing in room;with family/visitor present;with bed alarm set;with call bell/phone within reach (sitting EOB per pt's request) Nurse Communication: Mobility status     Sarajane Marek, [place] [phone] 01/10/2013

## 2013-01-10 NOTE — Progress Notes (Signed)
[facility] for heparin / Coumadin  Indication: atrial fibrillation  No Known Allergies  Labs:  Recent Labs  01/08/13 0520 01/09/13 0810 01/09/13 2004 01/10/13 0650  HGB 15.7 15.2  --  13.5  HCT 40.7 40.9  --  37.1*  PLT PLATELET CLUMPS NOTED ON SMEAR, UNABLE TO ESTIMATE 240  --  289  LABPROT  --  17.5*  --  20.5*  INR  --  1.48  --  1.83*  HEPARINUNFRC 0.31 0.41  --  0.44  CREATININE 2.31* 1.95* 1.88*  --     Estimated Creatinine Clearance: 39.5 ml/min (by C-G formula based on Cr of 1.88).  Assessment: 72 year old male with h/o afib continues on IV heparin and coumadin for anticoagulation. Admitted s/p VT/VF arrest and hypothermia protocol. Heparin level remains at goal at 0.44 and INR is low at 1.83 but increasing nicely. No bleeding noted, CBC Is stable.   Prior to admission Coumadin regimen of 2.5mg  daily, except 1.25mg  on Monday and Wednesday (per 12/05/12 anticoagulation note).   Goal of Therapy:  Heparin level 0.3-0.7 units/ml Monitor platelets by anticoagulation protocol: Yes   Plan:  1. Continue heparin gtt at 1500 units/hr 2. Coumadin 4mg  PO x 1 tonight - will likely reduce dose tomorrow depending on response 3. F/u AM INR and heparin level  Salome Arnt, PharmD, BCPS Pager # [phone] 01/10/2013 8:38 AM

## 2013-01-11 ENCOUNTER — Telehealth: Payer: Self-pay | Admitting: Cardiology

## 2013-01-11 ENCOUNTER — Encounter (HOSPITAL_COMMUNITY): Payer: Self-pay | Admitting: Nurse Practitioner

## 2013-01-11 DIAGNOSIS — N179 Acute kidney failure, unspecified: Secondary | ICD-10-CM

## 2013-01-11 DIAGNOSIS — I482 Chronic atrial fibrillation, unspecified: Secondary | ICD-10-CM

## 2013-01-11 LAB — COMPREHENSIVE METABOLIC PANEL
AST: 40 U/L — ABNORMAL HIGH (ref 0–37)
BUN: 29 mg/dL — ABNORMAL HIGH (ref 6–23)
CO2: 30 mEq/L (ref 19–32)
Calcium: 8.8 mg/dL (ref 8.4–10.5)
Chloride: 97 mEq/L (ref 96–112)
Creatinine, Ser: 1.79 mg/dL — ABNORMAL HIGH (ref 0.50–1.35)
GFR calc non Af Amer: 37 mL/min — ABNORMAL LOW (ref >90)
Total Bilirubin: 1.1 mg/dL (ref 0.3–1.2)

## 2013-01-11 LAB — PROTIME-INR
INR: 1.81 — ABNORMAL HIGH (ref 0.00–1.49)
Prothrombin Time: 20.3 seconds — ABNORMAL HIGH (ref 11.6–15.2)

## 2013-01-11 LAB — CBC
Hemoglobin: 13.5 g/dL (ref 13.0–17.0)
Platelets: 353 10*3/uL (ref 150–400)
RBC: 4.9 MIL/uL (ref 4.22–5.81)
WBC: 16.4 10*3/uL — ABNORMAL HIGH (ref 4.0–10.5)

## 2013-01-11 MED ORDER — POTASSIUM CHLORIDE CRYS ER 20 MEQ PO TBCR
40.0000 meq | EXTENDED_RELEASE_TABLET | Freq: Once | ORAL | Status: AC
  Administered 2013-01-11: 40 meq via ORAL
  Filled 2013-01-11: qty 2

## 2013-01-11 MED ORDER — WARFARIN SODIUM 5 MG PO TABS
5.0000 mg | ORAL_TABLET | Freq: Once | ORAL | Status: DC
  Filled 2013-01-11: qty 1

## 2013-01-11 MED ORDER — POTASSIUM CHLORIDE CRYS ER 20 MEQ PO TBCR: 20.0000 meq | EXTENDED_RELEASE_TABLET | Freq: Every day | ORAL | Status: DC

## 2013-01-11 MED ORDER — METOPROLOL SUCCINATE ER 25 MG PO TB24: 75.0000 mg | ORAL_TABLET | Freq: Every day | ORAL | Status: DC

## 2013-01-11 MED ORDER — CLOPIDOGREL BISULFATE 75 MG PO TABS: 75.0000 mg | ORAL_TABLET | Freq: Every day | ORAL | Status: DC

## 2013-01-11 MED ORDER — ATORVASTATIN CALCIUM 80 MG PO TABS: 80.0000 mg | ORAL_TABLET | Freq: Every day | ORAL | Status: DC

## 2013-01-11 MED ORDER — FUROSEMIDE 20 MG PO TABS: 20.0000 mg | ORAL_TABLET | Freq: Every day | ORAL | Status: DC

## 2013-01-11 MED ORDER — NITROGLYCERIN 0.4 MG SL SUBL: 0.4000 mg | SUBLINGUAL_TABLET | SUBLINGUAL | Status: DC | PRN

## 2013-01-11 NOTE — Progress Notes (Signed)
Speech Language Pathology Dysphagia Treatment Patient Details Name: Logan White MRN: [id number] DOB: 1941-04-21 Today's Date: 01/11/2013 Time: [id number] SLP Time Calculation (min): 10 min  Assessment / Plan / Recommendation Clinical Impression  F/u due to acute transient dysphagia s/p intubation.  No further indication of deficits.  Pt is consuming POs safely - dysphagia has resolved per observation with adequate airway protection.   No SLP f/u needed.  WIll sign-off    Diet Recommendation  Initiate / Change Diet: Regular;Thin liquid    SLP Plan All goals met   Pertinent Vitals/Pain No pain   Swallowing Goals  SLP Swallowing Goals Swallow Study Goal #2 - Progress: Met Swallow Study Goal #3 - Progress: Met  General Temperature Spikes Noted: No Respiratory Status: Room air Behavior/Cognition: Alert;Cooperative;Pleasant mood Oral Cavity - Dentition: Dentures, bottom Patient Positioning: Upright in chair  Oral Cavity - Oral Hygiene     Dysphagia Treatment Treatment focused on: Skilled observation of diet tolerance Treatment Methods/Modalities: Skilled observation Patient observed directly with PO's: Yes Type of PO's observed: Regular;Thin liquids Feeding: Able to feed self Liquids provided via: Straw;Cup Oral Phase Signs & Symptoms: Prolonged mastication Amount of cueing:  (no cues)        Logan White 01/11/2013, 9:46 AM

## 2013-01-11 NOTE — Telephone Encounter (Signed)
New problem      Per Gerald Stabs PA calling 7 day TCM with Dr [name].

## 2013-01-11 NOTE — Progress Notes (Signed)
ANTICOAGULATION CONSULT NOTE  Pharmacy Consult for heparin / Coumadin  Indication: atrial fibrillation  No Known Allergies  Labs:  Recent Labs  01/09/13 0810 01/09/13 2004 01/10/13 0650 01/11/13 0540  HGB 15.2  --  13.5 13.5  HCT 40.9  --  37.1* 37.3*  PLT 240  --  289 353  LABPROT 17.5*  --  20.5* 20.3*  INR 1.48  --  1.83* 1.81*  HEPARINUNFRC 0.41  --  0.44 0.59  CREATININE 1.95* 1.88* 1.78* 1.79*    Estimated Creatinine Clearance: 41.6 ml/min (by C-G formula based on Cr of 1.79).  Assessment: 72 year old male with h/o afib continues on IV heparin and coumadin for anticoagulation. Admitted s/p VT/VF arrest and hypothermia protocol. Heparin level remains at goal at 0.44 and INR is low at 1.81 (may be some resistance d/t vitamin K administration). No bleeding noted, CBC Is stable.   Prior to admission Coumadin regimen of 2.5mg  daily, except 1.25mg  on Monday and Wednesday (per 12/05/12 anticoagulation note).   Goal of Therapy:  INR 2-3   Plan:  1. Coumadin 5mg  PO x 1 tonight  2. F/u AM INR if still admitted  *IF DISCHARGED TODAY - Recommend 5mg  tonight then resume home regimen tomorrow  Salome Arnt, PharmD, BCPS Pager # [phone] 01/11/2013 9:15 AM
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
## 2013-01-11 NOTE — Progress Notes (Signed)
Patient: Logan White Date of Encounter: 01/11/2013, 8:21 AM Admit date: 12/30/2012     Subjective  Patient denies CP or dyspnea; mild cough (unchanged from home); no diarrhea, dysuria, abdominal pain   Objective  Physical Exam: Vitals: BP 119/74  Pulse 79  Temp(Src) 97.8 F (36.6 C) (Oral)  Resp 18  Ht 6\' 1"  (1.854 m)  Wt 166 lb 11.2 oz (75.615 kg)  BMI 22 kg/m2  SpO2 96% General: Well developed, WN, NAD HEENT: Normal Neck: Supple Lungs: CTA Heart: Irregularly irregular S1 S2 without murmur, rub or gallop appreciated  Abdomen/GU: Soft, non-distended; no RUQ tenderness Extremities: No edema; no evidence of infection at IV sites. Neuro: grossly intact  Intake/Output:  Intake/Output Summary (Last 24 hours) at 01/11/13 0821 Last data filed at 01/11/13 0802  Gross per 24 hour  Intake   5260 ml  Output   1200 ml  Net   4060 ml    Inpatient Medications:  . antiseptic oral rinse  15 mL Mouth Rinse QID  . atorvastatin  80 mg Oral q1800  . clopidogrel  75 mg Per NG tube Q breakfast  . feeding supplement  237 mL Oral BID BM  . furosemide  20 mg Oral Daily  . metoprolol succinate  75 mg Oral Daily  . pantoprazole  40 mg Oral Daily  . sodium chloride  3 mL Intravenous Q12H  . Warfarin - Pharmacist Dosing Inpatient   Does not apply q1800   . sodium chloride Stopped (01/10/13 1023)  . sodium chloride 5 mL/hr at 01/10/13 1900  . heparin 1,500 Units/hr (01/11/13 0652)    Labs:  Recent Labs  01/10/13 0650 01/11/13 0540  NA 136 137  K 3.5 3.3*  CL 96 97  CO2 26 30  GLUCOSE 134* 101*  BUN 36* 29*  CREATININE 1.78* 1.79*  CALCIUM 8.8 8.8     Recent Labs  01/10/13 0650 01/11/13 0540  WBC 18.0* 16.4*  HGB 13.5 13.5  HCT 37.1* 37.3*  MCV 74.5* 76.1*  PLT 289 353    Radiology/Studies: Ct Head Wo Contrast  01/02/2013  *RADIOLOGY REPORT*  Clinical Data: Cardiac arrest, rule out anoxic brain injury.  On Coumadin  CT HEAD WITHOUT CONTRAST  Technique:   Contiguous axial images were obtained from the base of the skull through the vertex without contrast.  Comparison: CT head 01/07/2007  Findings: Ventricle size is normal.  Negative for acute infarct. Mild chronic microvascular ischemic changes in the white matter. No evidence of cortical edema.  Pearline Cables and white matter differentiation is maintained.  Negative for hemorrhage or mass.  No acute infarct or edema.  No shift to the midline structures.  Mucosal edema in the paranasal sinuses.  Calcification in the cavernous carotid is mild.  No skull lesion.  Right frontal scalp lipoma over the convexities unchanged from the prior study and appears benign.  IMPRESSION: No acute intracranial abnormality.  If the patient continues to have symptoms of anoxic injury, follow-up CT or MRI is suggested.   Original Report Authenticated By: Carl Best, M.D.    Dg Chest Port 1 View  01/02/2013  *RADIOLOGY REPORT*  Clinical Data: Shortness of breath.  PORTABLE CHEST - 1 VIEW  Comparison: 01/01/2013.  Findings: Endotracheal tube tip 7.2 cm above the carina. Nasogastric tube tip gastric fundus.  Overlying leads noted.  Heart appears slightly prominent.  Calcified tortuous aorta.  Slight improved aeration right mid to lower lung zone.  Perihilar prominence may represent pulmonary vascular congestion/mild  pulmonary edema.  Infectious infiltrate is a secondary less likely consideration.  IMPRESSION: Slight improved aeration right mid to lower lung zone.  Please see above.   Original Report Authenticated By: Genia Del, M.D.     Telemetry: continued A fib    Assessment and Plan  Logan White is a 72 year old man with PMH of ischemic cardiomyopathy, Atrial fib on warfarin, CVA, DM2, CKD, HTN, and previous cardiac arrest a year ago (2/2 severe hypokalemia) admitted with V Fib/V Tach arrest now s/p emergent cardiac cath and induced hypothermia.  1. Ventricular Fibrillation arrest:  Witnessed, s/p CPR prior to ED presentation, s/p  induced hypothermia, telemetry Atrial fib -Dr Lovena Le has reviewed; plan life vest at DC; repeat echo in 3 months; if EF < 35, ICD.  2. STEMI: cardiac enzymes trending downwards, s/p emergent cardiac cath -cont plavix, lopressor and statin; discussed with Dr Wilhemina Cash previously; previous PCI aborted; Continue off ASA (would like to avoid dual antiplt therapy given need for coumadin).  Recent Labs Lab 01/04/13 0917  TROPONINI 12.35*   3. Ischemic Cardiomyopathy: Echo EF55%>>30-35% with RV dysfunction, pt with extensive CAD hx -continue lasix 20 mg po daily -continue beta blocker; no ACEI given renal insuff; add hydralazine nitrates later as tolerated by BP.   4. Atrial fibrillation: cont Afib on telemetry (HR controlled); continue toprol 75 mg po daily. -continue coumadin.  5. Acute on chronic renal failure: likely secondary to ATN, creatinine continues to improve, Renal US w/o hydronephrosis, diffusely echogenic bilaterally c/w medical renal disease, UOP adequate, pt stable from renal aspect -Renal signed off  Recent Labs Lab 01/08/13 0520 01/09/13 0810 01/09/13 2004 01/10/13 0650 01/11/13 0540  CREATININE 2.31* 1.95* 1.88* 1.78* 1.79*   7. Anoxic brain injury: alert and responsive, MRI w/o anoxic findings, improved.  8. Increasing WBC: WBC decreasing today; no localizing signs/symptoms of infection; no evidence of infection at IV sites; UA and chest xray neg; no RUQ tenderness on exam (cholelithiasis on previous ultrasound); afebrile.  9. Hypokalemia: will give KCL 40 meq today and DC on 20 meq po daily.  10. Elevated LFTs: most likely from recent arrest; improving.  DC with life vest today; check BMET and INR in coumadin clinic on Monday (DC on preadmission dose of coumadin); fu with me in 2 weeks. >30 min PA and physician time D2  Signed, Kirk Ruths , MD

## 2013-01-12 DIAGNOSIS — I469 Cardiac arrest, cause unspecified: Secondary | ICD-10-CM

## 2013-01-12 DIAGNOSIS — I679 Cerebrovascular disease, unspecified: Secondary | ICD-10-CM

## 2013-01-12 DIAGNOSIS — I2589 Other forms of chronic ischemic heart disease: Secondary | ICD-10-CM

## 2013-01-12 NOTE — Discharge Summary (Signed)
See progress notes Brian Crenshaw  

## 2013-01-14 ENCOUNTER — Ambulatory Visit (INDEPENDENT_AMBULATORY_CARE_PROVIDER_SITE_OTHER): Payer: Medicare Other

## 2013-01-14 ENCOUNTER — Ambulatory Visit (INDEPENDENT_AMBULATORY_CARE_PROVIDER_SITE_OTHER): Payer: Medicare Other | Admitting: Cardiology

## 2013-01-14 DIAGNOSIS — R7989 Other specified abnormal findings of blood chemistry: Secondary | ICD-10-CM

## 2013-01-14 DIAGNOSIS — Z7901 Long term (current) use of anticoagulants: Secondary | ICD-10-CM

## 2013-01-14 DIAGNOSIS — I469 Cardiac arrest, cause unspecified: Secondary | ICD-10-CM

## 2013-01-14 DIAGNOSIS — I4891 Unspecified atrial fibrillation: Secondary | ICD-10-CM

## 2013-01-14 DIAGNOSIS — I2510 Atherosclerotic heart disease of native coronary artery without angina pectoris: Secondary | ICD-10-CM

## 2013-01-14 DIAGNOSIS — I10 Essential (primary) hypertension: Secondary | ICD-10-CM

## 2013-01-14 LAB — HEPATIC FUNCTION PANEL
AST: 34 U/L (ref 0–37)
Alkaline Phosphatase: 185 U/L — ABNORMAL HIGH (ref 39–117)
Bilirubin, Direct: 0.1 mg/dL (ref 0.0–0.3)
Total Bilirubin: 0.9 mg/dL (ref 0.3–1.2)

## 2013-01-14 LAB — BASIC METABOLIC PANEL
BUN: 16 mg/dL (ref 6–23)
CO2: 23 mEq/L (ref 19–32)
Calcium: 8.4 mg/dL (ref 8.4–10.5)
Chloride: 100 mEq/L (ref 96–112)
Creatinine, Ser: 1.7 mg/dL — ABNORMAL HIGH (ref 0.4–1.5)
Glucose, Bld: 96 mg/dL (ref 70–99)

## 2013-01-14 LAB — POCT INR: INR: 2.3

## 2013-01-14 NOTE — Telephone Encounter (Signed)
Spoke to patient he stated he is doing well.Stated he understands his discharge instructions and medications.Patient is aware of INR check today at 1:45 pm and post hospital appointment with Dr.Crenshaw 01/18/13 at 9:30 am.

## 2013-01-18 ENCOUNTER — Ambulatory Visit (INDEPENDENT_AMBULATORY_CARE_PROVIDER_SITE_OTHER): Payer: Medicare Other | Admitting: Cardiology

## 2013-01-18 ENCOUNTER — Telehealth: Payer: Self-pay | Admitting: Cardiology

## 2013-01-18 ENCOUNTER — Encounter: Payer: Self-pay | Admitting: Cardiology

## 2013-01-18 ENCOUNTER — Encounter: Payer: Medicare Other | Admitting: Cardiology

## 2013-01-18 VITALS — BP 132/74 | HR 65 | Wt 170.0 lb

## 2013-01-18 DIAGNOSIS — I2589 Other forms of chronic ischemic heart disease: Secondary | ICD-10-CM

## 2013-01-18 DIAGNOSIS — I10 Essential (primary) hypertension: Secondary | ICD-10-CM

## 2013-01-18 DIAGNOSIS — I469 Cardiac arrest, cause unspecified: Secondary | ICD-10-CM

## 2013-01-18 DIAGNOSIS — I679 Cerebrovascular disease, unspecified: Secondary | ICD-10-CM

## 2013-01-18 DIAGNOSIS — E785 Hyperlipidemia, unspecified: Secondary | ICD-10-CM

## 2013-01-18 DIAGNOSIS — I255 Ischemic cardiomyopathy: Secondary | ICD-10-CM

## 2013-01-18 MED ORDER — LISINOPRIL 2.5 MG PO TABS: 2.5000 mg | ORAL_TABLET | Freq: Every day | ORAL | Status: DC

## 2013-01-18 NOTE — Assessment & Plan Note (Signed)
Continue Toprol and Coumadin.

## 2013-01-18 NOTE — Assessment & Plan Note (Signed)
Continue statin.schedule followup carotid Dopplers.

## 2013-01-18 NOTE — Telephone Encounter (Signed)
Spoke with Logan White and advised her that the document for her employer was ready for pick up.  She asked for it to mailed to her home address. Placed in the mail today.

## 2013-01-18 NOTE — Telephone Encounter (Signed)
Documentation completed by Dr. [name] and faxed to [phone] on 5.15.2014. Called and spoke with Logan White on 5.15.2014 to advise document faxed and ready for pick up. Patient was to have wife to call the office back. Stated she was at her place of employment.

## 2013-01-18 NOTE — Assessment & Plan Note (Addendum)
Continue beta blocker. Renal function has improved on most recent labs. Add lisinopril 2.5 mg daily. Check potassium and renal function in 3 days. Repeat echo in 3 months.

## 2013-01-18 NOTE — Assessment & Plan Note (Signed)
Patient is status post cardiac arrest x2. Initial episode was felt related to severe hypokalemia with a potassium of 2.2. His most recent episode occurred in the setting of a non-ST elevation myocardial infarction. His ejection fraction is 30-35%. Continue life vest. Repeat echocardiogram 3 months from most recent event. His ejection fraction less than 35% he will need a permanent ICD.

## 2013-01-18 NOTE — Patient Instructions (Signed)
Your physician recommends that you schedule a follow-up appointment in:  [place] recommends that you return for lab work Monday 01-21-13  START LISINOPRIL 2.5 MG ONCE DAILY  Your physician has requested that you have an echocardiogram. Echocardiography is a painless test that uses sound waves to create images of your heart. It provides your doctor with information about the size and shape of your heart and how well your heart's chambers and valves are working. This procedure takes approximately one hour. There are no restrictions for this procedure. 04-01-13   Your physician has requested that you have a carotid duplex. This test is an ultrasound of the carotid arteries in your neck. It looks at blood flow through these arteries that supply the brain with blood. Allow one hour for this exam. There are no restrictions or special instructions.

## 2013-01-18 NOTE — Assessment & Plan Note (Signed)
-   Continue Plavix and statin 

## 2013-01-18 NOTE — Assessment & Plan Note (Signed)
Continue statin. 

## 2013-01-18 NOTE — Assessment & Plan Note (Signed)
Continue present blood pressure medications. 

## 2013-01-18 NOTE — Progress Notes (Signed)
HPI: Pleasant male for fu of recent ventricular fibrillation arrest, coronary disease, ischemic cardiomyopathy and atrial fibrillation. Patient with a history of PCI of his LAD in October 2004. Last carotid Doppler performed in August 2013 showed 40-59% left and 60-79% right stenosis. Followup was recommended in 6 months. He was admitted in June of 2013 following a ventricular fibrillation arrest that was felt related to hypokalemia (K 2.2). Patient improved following that episode. Patient admitted in April of 2014 following a ventricular fibrillation arrest. Cardiac catheterization in April of 2014 showed a patent stent in the LAD with a 40% in-stent restenosis. There was an apical 80% lesion. The circumflex was occluded. The right coronary artery was small and nondominant. The patient had attempt at PCI of the occluded circumflex but was unsuccessful. Echocardiogram in April of 2014 showed an ejection fraction of 30-35%, mild left atrial enlargement, moderately reduced RV function and mild mitral regurgitation. Patient was seen by Dr. Lovena Le of electrophysiology in ICD felt not indicated at this time because the patient did suffer a non-ST elevation myocardial infarction with positive enzymes. He was discharged with a life vest. The plan is to repeat his echocardiogram 3 months after his event and if his ejection fraction is less than 35% he will require ICD. Most recent laboratories on May 12 showed a potassium of 4.0, BUN 16 and creatinine 1.7. Since he was discharged, he denies dyspnea, chest pain, palpitations, syncope or bleeding.   Current Outpatient Prescriptions  Medication Sig Dispense Refill  . atorvastatin (LIPITOR) 80 MG tablet Take 1 tablet (80 mg total) by mouth daily at 6 PM.  30 tablet  6  . clopidogrel (PLAVIX) 75 MG tablet Take 1 tablet (75 mg total) by mouth daily.  30 tablet  6  . furosemide (LASIX) 20 MG tablet Take 1 tablet (20 mg total) by mouth daily.  30 tablet  6  . metoprolol  succinate (TOPROL-XL) 25 MG 24 hr tablet Take 3 tablets (75 mg total) by mouth daily.  90 tablet  6  . nitroGLYCERIN (NITROSTAT) 0.4 MG SL tablet Place 1 tablet (0.4 mg total) under the tongue every 5 (five) minutes as needed for chest pain (Up to 3 doses).  25 tablet  3  . potassium chloride SA (K-DUR,KLOR-CON) 20 MEQ tablet Take 1 tablet (20 mEq total) by mouth daily.  30 tablet  6  . warfarin (COUMADIN) 2.5 MG tablet Take 1 tablet (2.5 mg total) by mouth as directed.  35 tablet  2  . [DISCONTINUED] famotidine (PEPCID) 20 MG tablet Take 1 tablet (20 mg total) by mouth 2 (two) times daily.  60 tablet  11  . [DISCONTINUED] hydrALAZINE (APRESOLINE) 50 MG tablet Take 50 mg by mouth 3 (three) times daily.         No current facility-administered medications for this visit.     Past Medical History  Diagnosis Date  . Inguinal hernia without mention of obstruction or gangrene, unilateral or unspecified, (not specified as recurrent)   . 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 Peripheral vascular disease, unspecified   . Hyperlipidemia     mixed  . Hypertension     a. echo 4/12: EF 50%, asymmetric septal hypertrophy, no SAM or LVOT gradient, LAE, PASP 35  . Coronary artery disease     a. s/p aborted ant STEMI tx with Cypher DES to LAD 10/04 (residual at cath: D1 50%, CFX 40% and multiple dist 70%, EF 55%);   b. myoview 3/10:  Ef 47%, infero-apical isch, LOW RISK - med Tx recommended  . CKD (chronic kidney disease)   . Atrial fibrillation 12/2010  . Diverticulosis     2001  . Diabetes mellitus   . H/O: CVA (cardiovascular accident)   . Carotid artery stenosis     01/2011 - 40-59% bilateral stenosis  . Peripheral vascular disease     h/o LE angioplasty  . 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  Ischemic cardiomyopathy     a. EF 50-55% by echo 03/09/12 (was 30% by echo 02/24/12)  . Mitral regurgitation     Mild by echo 03/2012  . Hematemesis     12/2010 felt 2/2 Mallory Weiss tear - pt could not afford colonscopy/EGD at that time so Coumadin was deferred. Coumadin initiated 03/2012 without any evidence for bleeding.  . Inguinal hernia   . Diverticulosis 2001  . QT prolongation   . Bradycardia     Requiring discontinuation of use of BB/CCB  . Elevated LFTs     Shock liver 03/2012    Past Surgical History  Procedure Laterality Date  . Popliteal artery angioplasty  01/07/07    s/p LEFT POPLITEAL ARTERY EXPLORATION AND VEIN PATCH ANGIOPLASTY, PER DR. EARLY, SECONDARY TO ISCHEMIC LEFT FOOT RELATED TO LEFT POPLITEAL ARTERY INJURY  . Orif tibia & fibula fractures  01/11/07    OPEN TX OF UNICONDYLAR PLATEAU FRACTURE, IRRIGATION/DEBRIDEMENT OF OPEN FRACTURE INCLUDING BONE, REMOVAL OF EXTERNAL FIXATOR UNDER ANESTHESIA PER DR. MICHAEL HANDY  . Hernia repair    . Skin graft      History   Social History  . Marital Status: Married    Spouse Name: N/A    Number of Children: 4  . Years of Education: N/A   Occupational History  . 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 Smoking status: Former Smoker -- 0.50 packs/day for 30 years    Types: Cigarettes    Quit date: 03/12/2012  . Smokeless tobacco: Not on file  . Alcohol Use: No  . Drug Use: 2.00 per week    Special: Marijuana  . Sexually Active: Not on file   Other Topics Concern  . 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 YES. 1/2 -1 PPD OF CIGARETTES    NO ETOH    ROS: no fevers or chills, productive cough, hemoptysis, dysphasia, odynophagia, melena, hematochezia, dysuria, hematuria, rash, seizure activity, orthopnea, PND, pedal edema, claudication. Remaining systems are negative.  Physical  Exam: Well-developed well-nourished in no acute distress.  Skin is warm and dry.  HEENT is normal.  Neck is supple.  Chest is clear to auscultation with normal expansion.  Cardiovascular exam is irregular Abdominal exam nontender or distended. No masses palpated. Extremities show no edema. neuro grossly intact

## 2013-01-21 ENCOUNTER — Other Ambulatory Visit: Payer: Medicare Other

## 2013-01-22 ENCOUNTER — Encounter (INDEPENDENT_AMBULATORY_CARE_PROVIDER_SITE_OTHER): Payer: Medicare Other

## 2013-01-22 DIAGNOSIS — I6529 Occlusion and stenosis of unspecified carotid artery: Secondary | ICD-10-CM

## 2013-01-22 DIAGNOSIS — I679 Cerebrovascular disease, unspecified: Secondary | ICD-10-CM

## 2013-02-10 ENCOUNTER — Other Ambulatory Visit: Payer: Self-pay | Admitting: Cardiology

## 2013-03-23 ENCOUNTER — Emergency Department (HOSPITAL_COMMUNITY)
Admission: EM | Admit: 2013-03-23 | Discharge: 2013-03-23 | Disposition: A | Discharge status: Home / Self Care | Payer: Medicare Other | Attending: Emergency Medicine | Admitting: Emergency Medicine

## 2013-03-23 ENCOUNTER — Encounter (HOSPITAL_COMMUNITY): Payer: Self-pay | Admitting: Nurse Practitioner

## 2013-03-23 DIAGNOSIS — I129 Hypertensive chronic kidney disease with stage 1 through stage 4 chronic kidney disease, or unspecified chronic kidney disease: Secondary | ICD-10-CM | POA: Insufficient documentation

## 2013-03-23 DIAGNOSIS — E1129 Type 2 diabetes mellitus with other diabetic kidney complication: Secondary | ICD-10-CM | POA: Insufficient documentation

## 2013-03-23 DIAGNOSIS — Z7901 Long term (current) use of anticoagulants: Secondary | ICD-10-CM | POA: Insufficient documentation

## 2013-03-23 DIAGNOSIS — I2510 Atherosclerotic heart disease of native coronary artery without angina pectoris: Secondary | ICD-10-CM | POA: Insufficient documentation

## 2013-03-23 DIAGNOSIS — Z8679 Personal history of other diseases of the circulatory system: Secondary | ICD-10-CM | POA: Insufficient documentation

## 2013-03-23 DIAGNOSIS — R1310 Dysphagia, unspecified: Secondary | ICD-10-CM

## 2013-03-23 DIAGNOSIS — Z79899 Other long term (current) drug therapy: Secondary | ICD-10-CM | POA: Insufficient documentation

## 2013-03-23 DIAGNOSIS — Z8674 Personal history of sudden cardiac arrest: Secondary | ICD-10-CM | POA: Insufficient documentation

## 2013-03-23 DIAGNOSIS — Z87891 Personal history of nicotine dependence: Secondary | ICD-10-CM | POA: Insufficient documentation

## 2013-03-23 DIAGNOSIS — N189 Chronic kidney disease, unspecified: Secondary | ICD-10-CM | POA: Insufficient documentation

## 2013-03-23 DIAGNOSIS — Z8673 Personal history of transient ischemic attack (TIA), and cerebral infarction without residual deficits: Secondary | ICD-10-CM | POA: Insufficient documentation

## 2013-03-23 DIAGNOSIS — Z8719 Personal history of other diseases of the digestive system: Secondary | ICD-10-CM | POA: Insufficient documentation

## 2013-03-23 DIAGNOSIS — E785 Hyperlipidemia, unspecified: Secondary | ICD-10-CM | POA: Insufficient documentation

## 2013-03-23 LAB — CBC WITH DIFFERENTIAL/PLATELET
Basophils Relative: 0 % (ref 0–1)
HCT: 43.3 % (ref 39.0–52.0)
Hemoglobin: 15.6 g/dL (ref 13.0–17.0)
Lymphocytes Relative: 46 % (ref 12–46)
MCHC: 36 g/dL (ref 30.0–36.0)
Monocytes Absolute: 0.5 10*3/uL (ref 0.1–1.0)
Monocytes Relative: 8 % (ref 3–12)
Neutro Abs: 2.7 10*3/uL (ref 1.7–7.7)
Neutrophils Relative %: 44 % (ref 43–77)
RBC: 5.65 MIL/uL (ref 4.22–5.81)
WBC: 6.2 10*3/uL (ref 4.0–10.5)

## 2013-03-23 LAB — COMPREHENSIVE METABOLIC PANEL
AST: 22 U/L (ref 0–37)
Albumin: 4.1 g/dL (ref 3.5–5.2)
Alkaline Phosphatase: 178 U/L — ABNORMAL HIGH (ref 39–117)
BUN: 27 mg/dL — ABNORMAL HIGH (ref 6–23)
CO2: 27 mEq/L (ref 19–32)
Chloride: 95 mEq/L — ABNORMAL LOW (ref 96–112)
Creatinine, Ser: 1.84 mg/dL — ABNORMAL HIGH (ref 0.50–1.35)
GFR calc non Af Amer: 36 mL/min — ABNORMAL LOW (ref >90)
Potassium: 4.2 mEq/L (ref 3.5–5.1)
Total Bilirubin: 1.1 mg/dL (ref 0.3–1.2)

## 2013-03-23 LAB — URINALYSIS, ROUTINE W REFLEX MICROSCOPIC
Glucose, UA: NEGATIVE mg/dL
Ketones, ur: NEGATIVE mg/dL
Protein, ur: NEGATIVE mg/dL
pH: 5.5 (ref 5.0–8.0)

## 2013-03-23 NOTE — ED Notes (Addendum)
Pt reports vomiting for past several days. Denies pain, bowel/bladder changes. Pt has been unable to tolerate daily meds due to vomiting. [facility] to [facility] and they recommended him to come to ER evaluation since pt has been off daily meds. cxr was completed at [facility] and was normal, pt has images on disk with him

## 2013-03-23 NOTE — ED Notes (Signed)
Pt knows that urine is needed

## 2013-03-23 NOTE — ED Provider Notes (Signed)
History    CSN: [id number] Arrival date & time 03/23/13  1544  First MD Initiated Contact with Patient 03/23/13 1632     Chief Complaint  Patient presents with  . Emesis   (Consider location/radiation/quality/duration/timing/severity/associated sxs/prior Treatment) HPI Comments: Logan White is a 72 y.o. male who presents for evaluation of swallowing difficulty. The problem is worsening over the last week. He was able to tolerate some Gatorade last night and this morning, without vomiting. Prior to that, yesterday, he could not swallow food, because it felt like it was "swelling in my mouth". At other times he has felt like things got caught in his lower chest area, then would move up and down before passing into the stomach. He's not had a bowel movement recently, because he is not "keeping anything down". He has been unable to take any of his medications, for 2 days, because he could not swallow them. He denies headache, chest pain, back pain, abdominal pain, weakness, dizziness, fever, chills, dysuria, urinary frequency, or constipation. He had a similar episode about one month ago that resolved spontaneously after about a day. He did not seek evaluation at that time. There are no other known modifying factors.  Patient is a 72 y.o. male presenting with vomiting. The history is provided by the patient.  Emesis  Past Medical History  Diagnosis Date  . Inguinal hernia without mention of obstruction or gangrene, unilateral or unspecified, (not specified as recurrent)   . 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 Peripheral vascular disease, unspecified   . Hyperlipidemia     mixed  . Hypertension     a. echo 4/12: EF 50%, asymmetric septal hypertrophy, no SAM or LVOT gradient, LAE, PASP 35  . Coronary artery disease     a. s/p aborted ant STEMI tx with Cypher DES to LAD 10/04 (residual at cath: D1 50%, CFX 40% and multiple dist 70%, EF 55%);   b.  myoview 3/10: Ef 47%, infero-apical isch, LOW RISK - med Tx recommended  . CKD (chronic kidney disease)   . Atrial fibrillation 12/2010  . Diverticulosis     2001  . Diabetes mellitus   . H/O: CVA (cardiovascular accident)   . Carotid artery stenosis     01/2011 - 40-59% bilateral stenosis  . Peripheral vascular disease     h/o LE angioplasty  . 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 Ischemic cardiomyopathy     a. EF 50-55% by echo 03/09/12 (was 30% by echo 02/24/12)  . Mitral regurgitation     Mild by echo 03/2012  . Hematemesis     12/2010 felt 2/2 Mallory Weiss tear - pt could not afford colonscopy/EGD at that time so Coumadin was deferred. Coumadin initiated 03/2012 without any evidence for bleeding.  . Inguinal hernia   . Diverticulosis 2001  . QT prolongation   . Bradycardia     Requiring discontinuation of use of BB/CCB  . Elevated LFTs     Shock liver 03/2012   Past Surgical History  Procedure Laterality Date  . Popliteal artery angioplasty  01/07/07    s/p LEFT POPLITEAL ARTERY EXPLORATION AND VEIN PATCH ANGIOPLASTY, PER DR. EARLY, SECONDARY TO ISCHEMIC LEFT FOOT RELATED TO LEFT POPLITEAL ARTERY INJURY  . Orif tibia & fibula fractures  01/11/07    OPEN TX OF UNICONDYLAR PLATEAU  FRACTURE, IRRIGATION/DEBRIDEMENT OF OPEN FRACTURE INCLUDING BONE, REMOVAL OF EXTERNAL FIXATOR UNDER ANESTHESIA PER DR. MICHAEL HANDY  . Hernia repair    . Skin graft     Family History  Problem Relation Age of Onset  . Heart disease Father     ALSO UNCLE DIED HAD CAD  . Brain cancer Mother   . Leukemia Mother   . Sickle cell anemia Mother   . Heart failure Father   . Sickle cell anemia Other    History  Substance Use Topics  . Smoking status: Former Smoker -- 0.50 packs/day for 30 years    Types: Cigarettes    Quit date: 03/12/2012   . Smokeless tobacco: Not on file  . Alcohol Use: No    Review of Systems  Gastrointestinal: Positive for vomiting.  All other systems reviewed and are negative.    Allergies  Review of patient's allergies indicates no known allergies.  Home Medications   Current Outpatient Rx  Name  Route  Sig  Dispense  Refill  . atorvastatin (LIPITOR) 80 MG tablet   Oral   Take 1 tablet (80 mg total) by mouth daily at 6 PM.   30 tablet   6   . Calcium Carbonate Antacid (TUMS PO)   Oral   Take 1 tablet by mouth daily as needed (indigestion).         . clopidogrel (PLAVIX) 75 MG tablet   Oral   Take 1 tablet (75 mg total) by mouth daily.   30 tablet   6   . furosemide (LASIX) 20 MG tablet   Oral   Take 1 tablet (20 mg total) by mouth daily.   30 tablet   6   . lisinopril (PRINIVIL,ZESTRIL) 2.5 MG tablet   Oral   Take 1 tablet (2.5 mg total) by mouth daily.   90 tablet   3   . metoprolol succinate (TOPROL-XL) 25 MG 24 hr tablet   Oral   Take 25 mg by mouth 3 (three) times daily. 8am, 12pm, 6pm         . nitroGLYCERIN (NITROSTAT) 0.4 MG SL tablet   Sublingual   Place 1 tablet (0.4 mg total) under the tongue every 5 (five) minutes as needed for chest pain (Up to 3 doses).   25 tablet   3   . potassium chloride SA (K-DUR,KLOR-CON) 20 MEQ tablet   Oral   Take 1 tablet (20 mEq total) by mouth daily.   30 tablet   6   . spironolactone (ALDACTONE) 25 MG tablet   Oral   Take 25 mg by mouth daily.         Marland Kitchen warfarin (COUMADIN) 2.5 MG tablet   Oral   Take 1.25-2.5 mg by mouth daily. 1/2 tablet (1.25 mg) on Monday and Friday, 1 tablet (2.5 mg) on all other days          BP 105/77  Pulse 49  Temp(Src) 98.7 F (37.1 C) (Oral)  Resp 21  SpO2 94% Physical Exam  Nursing note and vitals reviewed. Constitutional: He is oriented to person, place, and time. He appears well-developed. No distress.  Elderly, calm  HENT:  Head: Normocephalic and atraumatic.  Right  Ear: External ear normal.  Left Ear: External ear normal.  Nose: Nose normal.  Mouth/Throat: Oropharynx is clear and moist. No oropharyngeal exudate.  No uvula deviation. No trismus  Eyes: Conjunctivae and EOM are normal. Pupils are equal, round, and reactive to light.  Neck:  Normal range of motion and phonation normal. Neck supple.  Cardiovascular: Normal rate, regular rhythm, normal heart sounds and intact distal pulses.   Pulmonary/Chest: Effort normal and breath sounds normal. He exhibits no bony tenderness.  He is wearing an external defibrillator  Abdominal: Soft. Normal appearance. There is no tenderness.  Musculoskeletal: Normal range of motion.  Neurological: He is alert and oriented to person, place, and time. He has normal strength. No cranial nerve deficit or sensory deficit. He exhibits normal muscle tone. Coordination normal.  Skin: Skin is warm, dry and intact.  Psychiatric: He has a normal mood and affect. His behavior is normal. Judgment and thought content normal.    ED Course  Procedures (including critical care time)  6:43 PM Reevaluation with update and discussion. After initial assessment and treatment, an updated evaluation reveals he is tolerating water and crackers, now. He relates that he is having marriage difficulties and plans on leaving his wife, and going back home to [place], [place]. His wife has left the emergency department, to get something to eat. We are trying to locate his medications, so he can attempt to take them. Veronnica Hennings L        Date: 03/23/13  Rate: 92  Rhythm: atrial fibrillation  QRS Axis: left  PR and QT Intervals: QT normal  ST/T Wave abnormalities: early repolarization  PR and QRS Conduction Disutrbances:QRS normal  Narrative Interpretation:   Old EKG Reviewed: changes noted- 12/31/12- prior tracing normal sinus rhythm (AF is paroxysmal)   Labs Reviewed  CBC WITH DIFFERENTIAL - Abnormal; Notable for the following:     MCV 76.6 (*)    All other components within normal limits  COMPREHENSIVE METABOLIC PANEL - Abnormal; Notable for the following:    Sodium 132 (*)    Chloride 95 (*)    Glucose, Bld 102 (*)    BUN 27 (*)    Creatinine, Ser 1.84 (*)    Alkaline Phosphatase 178 (*)    GFR calc non Af Amer 36 (*)    GFR calc Af Amer 41 (*)    All other components within normal limits  URINALYSIS, ROUTINE W REFLEX MICROSCOPIC - Abnormal; Notable for the following:    Bilirubin Urine SMALL (*)    All other components within normal limits  PROTIME-INR - Abnormal; Notable for the following:    Prothrombin Time 22.3 (*)    INR 2.03 (*)    All other components within normal limits  LIPASE, BLOOD    1. Dysphagia, unspecified(787.20)     MDM  Unspecified, intermittent dysphagia. Suspect esophageal disorder, possibly related to GERD. He was able to tolerate oral liquids oral food and his medications, in ED. Doubt metabolic instability, serious bacterial infection or impending vascular collapse; the patient is stable for discharge.   Nursing Notes Reviewed/ Care Coordinated, and agree without changes. Applicable Imaging Reviewed.  Interpretation of Laboratory Data incorporated into ED treatment   Plan: Home Medications- usual; Home Treatments and Observation- rest, fluids, watch for progressive symptoms; return here if the recommended treatment, does not improve the symptoms; Recommended follow up- follow up with his GI specialist and/or his PCP, in one week    Richarda Blade, MD 03/24/13 0020

## 2013-04-01 ENCOUNTER — Ambulatory Visit (HOSPITAL_COMMUNITY): Payer: Medicare Other | Attending: Cardiology | Admitting: Radiology

## 2013-04-01 DIAGNOSIS — I2589 Other forms of chronic ischemic heart disease: Secondary | ICD-10-CM | POA: Insufficient documentation

## 2013-04-01 DIAGNOSIS — I059 Rheumatic mitral valve disease, unspecified: Secondary | ICD-10-CM | POA: Insufficient documentation

## 2013-04-01 DIAGNOSIS — I255 Ischemic cardiomyopathy: Secondary | ICD-10-CM

## 2013-04-01 DIAGNOSIS — Z8673 Personal history of transient ischemic attack (TIA), and cerebral infarction without residual deficits: Secondary | ICD-10-CM | POA: Insufficient documentation

## 2013-04-01 DIAGNOSIS — I2510 Atherosclerotic heart disease of native coronary artery without angina pectoris: Secondary | ICD-10-CM | POA: Insufficient documentation

## 2013-04-01 DIAGNOSIS — E119 Type 2 diabetes mellitus without complications: Secondary | ICD-10-CM | POA: Insufficient documentation

## 2013-04-01 DIAGNOSIS — E785 Hyperlipidemia, unspecified: Secondary | ICD-10-CM | POA: Insufficient documentation

## 2013-04-01 DIAGNOSIS — I10 Essential (primary) hypertension: Secondary | ICD-10-CM | POA: Insufficient documentation

## 2013-04-01 DIAGNOSIS — I4891 Unspecified atrial fibrillation: Secondary | ICD-10-CM | POA: Insufficient documentation

## 2013-04-01 DIAGNOSIS — I379 Nonrheumatic pulmonary valve disorder, unspecified: Secondary | ICD-10-CM | POA: Insufficient documentation

## 2013-04-01 DIAGNOSIS — Z8674 Personal history of sudden cardiac arrest: Secondary | ICD-10-CM | POA: Insufficient documentation

## 2013-04-01 DIAGNOSIS — I079 Rheumatic tricuspid valve disease, unspecified: Secondary | ICD-10-CM | POA: Insufficient documentation

## 2013-04-01 DIAGNOSIS — I2109 ST elevation (STEMI) myocardial infarction involving other coronary artery of anterior wall: Secondary | ICD-10-CM

## 2013-04-01 DIAGNOSIS — I4892 Unspecified atrial flutter: Secondary | ICD-10-CM | POA: Insufficient documentation

## 2013-04-01 NOTE — Progress Notes (Signed)
Echocardiogram performed.  

## 2013-04-03 ENCOUNTER — Telehealth: Payer: Self-pay | Admitting: Cardiology

## 2013-04-03 NOTE — Telephone Encounter (Signed)
Spoke with pt, aware of echo results. He questions if he needs to cont to wear the life vest. Will discuss with dr [name]

## 2013-04-03 NOTE — Telephone Encounter (Signed)
Follow Up ° ° °Pt returning call from earlier. Please call back. °

## 2013-04-05 ENCOUNTER — Ambulatory Visit (INDEPENDENT_AMBULATORY_CARE_PROVIDER_SITE_OTHER): Payer: Medicare Other | Admitting: Cardiology

## 2013-04-05 ENCOUNTER — Encounter: Payer: Self-pay | Admitting: Cardiology

## 2013-04-05 VITALS — BP 132/80 | HR 74 | Ht 75.5 in | Wt 184.0 lb

## 2013-04-05 DIAGNOSIS — I10 Essential (primary) hypertension: Secondary | ICD-10-CM

## 2013-04-05 DIAGNOSIS — E785 Hyperlipidemia, unspecified: Secondary | ICD-10-CM

## 2013-04-05 DIAGNOSIS — I255 Ischemic cardiomyopathy: Secondary | ICD-10-CM

## 2013-04-05 DIAGNOSIS — I2589 Other forms of chronic ischemic heart disease: Secondary | ICD-10-CM

## 2013-04-05 LAB — BASIC METABOLIC PANEL
BUN: 24 mg/dL — ABNORMAL HIGH (ref 6–23)
CO2: 25 mEq/L (ref 19–32)
Chloride: 106 mEq/L (ref 96–112)
Glucose, Bld: 114 mg/dL — ABNORMAL HIGH (ref 70–99)
Potassium: 3.5 mEq/L (ref 3.5–5.1)
Sodium: 139 mEq/L (ref 135–145)

## 2013-04-05 MED ORDER — METOPROLOL SUCCINATE ER 25 MG PO TB24: ORAL_TABLET | ORAL | Status: DC

## 2013-04-05 NOTE — Assessment & Plan Note (Signed)
Given recent infarct continue Plavix for one year. Continue statin.

## 2013-04-05 NOTE — Assessment & Plan Note (Signed)
LV function now improved.continue beta blocker and ACE inhibitor.

## 2013-04-05 NOTE — Assessment & Plan Note (Signed)
Continue statin. 

## 2013-04-05 NOTE — Assessment & Plan Note (Signed)
Continue present blood pressure medications. 

## 2013-04-05 NOTE — Assessment & Plan Note (Signed)
Recheck potassium and renal function °

## 2013-04-05 NOTE — Patient Instructions (Addendum)
Your physician wants you to follow-up in: 3 [place] will receive a reminder letter in the mail two months in advance. If you don't receive a letter, please call our office to schedule the follow-up appointment.   Your physician recommends that you HAVE LAB WORK TODAY  CHANGE METOPROLOL TO 25 MG THREE TABLETS ONCE DAILY

## 2013-04-05 NOTE — Assessment & Plan Note (Signed)
Patient remains in permanent atrial fibrillation. Continue beta blocker for rate control and a consolidate to once daily Toprol. Continue Coumadin.

## 2013-04-05 NOTE — Assessment & Plan Note (Signed)
Continue statin. Followup carotid Dopplers in November 2014.

## 2013-04-05 NOTE — Progress Notes (Signed)
HPI: Pleasant male for fu of recent ventricular fibrillation arrest, coronary disease, ischemic cardiomyopathy and atrial fibrillation. Patient with a history of PCI of his LAD in October 2004. Last carotid Doppler performed in May of 2014 showed 60-79% bilateral stenosis. Followup was recommended in 6 months. He was admitted in June of 2013 following a ventricular fibrillation arrest that was felt related to hypokalemia (K 2.2). Patient improved following that episode. Patient admitted in April of 2014 following a ventricular fibrillation arrest. Cardiac catheterization in April of 2014 showed a patent stent in the LAD with a 40% in-stent restenosis. There was an apical 80% lesion. The circumflex was occluded. The right coronary artery was small and nondominant. The patient had attempt at PCI of the occluded circumflex but was unsuccessful. Echocardiogram in April of 2014 showed an ejection fraction of 30-35%, mild left atrial enlargement, moderately reduced RV function and mild mitral regurgitation. Patient was seen by Dr. Lovena Le of electrophysiology and ICD felt not indicated at this time because the patient did suffer a non-ST elevation myocardial infarction with positive enzymes. He was discharged with a life vest. Echocardiogram repeated in July of 2014. There was mild left ventricular hypertrophy and his ejection fraction was 55-60%. There was biatrial enlargement and mild mitral regurgitation. Since he was last seen, no dyspnea, chest pain, palpitations or syncope. No pedal edema.  Current Outpatient Prescriptions  Medication Sig Dispense Refill  . atorvastatin (LIPITOR) 80 MG tablet Take 1 tablet (80 mg total) by mouth daily at 6 PM.  30 tablet  6  . clopidogrel (PLAVIX) 75 MG tablet Take 1 tablet (75 mg total) by mouth daily.  30 tablet  6  . furosemide (LASIX) 20 MG tablet Take 1 tablet (20 mg total) by mouth daily.  30 tablet  6  . lisinopril (PRINIVIL,ZESTRIL) 2.5 MG tablet Take 1 tablet (2.5  mg total) by mouth daily.  90 tablet  3  . metoprolol succinate (TOPROL-XL) 25 MG 24 hr tablet Take 25 mg by mouth 3 (three) times daily. 8am, 12pm, 6pm      . nitroGLYCERIN (NITROSTAT) 0.4 MG SL tablet Place 1 tablet (0.4 mg total) under the tongue every 5 (five) minutes as needed for chest pain (Up to 3 doses).  25 tablet  3  . potassium chloride SA (K-DUR,KLOR-CON) 20 MEQ tablet Take 1 tablet (20 mEq total) by mouth daily.  30 tablet  6  . spironolactone (ALDACTONE) 25 MG tablet Take 25 mg by mouth daily.      Marland Kitchen warfarin (COUMADIN) 2.5 MG tablet Take 1.25-2.5 mg by mouth daily. 1/2 tablet (1.25 mg) on Monday and Friday, 1 tablet (2.5 mg) on all other days      . [DISCONTINUED] famotidine (PEPCID) 20 MG tablet Take 1 tablet (20 mg total) by mouth 2 (two) times daily.  60 tablet  11  . [DISCONTINUED] hydrALAZINE (APRESOLINE) 50 MG tablet Take 50 mg by mouth 3 (three) times daily.         No current facility-administered medications for this visit.     Past Medical History  Diagnosis Date  . Inguinal hernia without mention of obstruction or gangrene, unilateral or unspecified, (not specified as recurrent)   . 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 Peripheral vascular disease, unspecified   . Hyperlipidemia     mixed  . Hypertension     a. echo 4/12: EF 50%, asymmetric septal hypertrophy, no SAM or LVOT gradient, LAE, PASP 35  . Coronary artery  disease     a. s/p aborted ant STEMI tx with Cypher DES to LAD 10/04 (residual at cath: D1 50%, CFX 40% and multiple dist 70%, EF 55%);   b. myoview 3/10: Ef 47%, infero-apical isch, LOW RISK - med Tx recommended  . CKD (chronic kidney disease)   . Atrial fibrillation 12/2010  . Diverticulosis     2001  . Diabetes mellitus   . H/O: CVA (cardiovascular accident)   . Carotid artery stenosis     01/2011 - 40-59% bilateral stenosis  . Peripheral vascular disease     h/o LE angioplasty  . Cardiac arrest -  ventricular fibrillation     03/2012 in setting of hypokalemia (prolonged hosp with VDRF, tracheobronchitis, ARF, shock liver, PAF, AMS felt secondary to post-anoxic encephalopathy/shock)  . Ischemic cardiomyopathy     a. EF 50-55% by echo 03/09/12 (was 30% by echo 02/24/12)  . Mitral regurgitation     Mild by echo 03/2012  . Hematemesis     12/2010 felt 2/2 Mallory Weiss tear - pt could not afford colonscopy/EGD at that time so Coumadin was deferred. Coumadin initiated 03/2012 without any evidence for bleeding.  . Inguinal hernia   . Diverticulosis 2001  . QT prolongation   . Bradycardia     Requiring discontinuation of use of BB/CCB  . Elevated LFTs     Shock liver 03/2012    Past Surgical History  Procedure Laterality Date  . Popliteal artery angioplasty  01/07/07    s/p LEFT POPLITEAL ARTERY EXPLORATION AND VEIN PATCH ANGIOPLASTY, PER DR. EARLY, SECONDARY TO ISCHEMIC LEFT FOOT RELATED TO LEFT POPLITEAL ARTERY INJURY  . Orif tibia & fibula fractures  01/11/07    OPEN TX OF UNICONDYLAR PLATEAU FRACTURE, IRRIGATION/DEBRIDEMENT OF OPEN FRACTURE INCLUDING BONE, REMOVAL OF EXTERNAL FIXATOR UNDER ANESTHESIA PER DR. MICHAEL HANDY  . Hernia repair    . Skin graft      History   Social History  . Marital Status: Married    Spouse Name: N/A    Number of Children: 4  . Years of Education: N/A   Occupational History  . 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 Smoking status: Former Smoker -- 0.50 packs/day for 30 years    Types: Cigarettes    Quit date: 03/12/2012  . Smokeless tobacco: Not on file  . Alcohol Use: No  . Drug Use: No  . Sexually Active: Not on file   Other Topics Concern  . 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 YES. 1/2 -1 PPD OF CIGARETTES    NO ETOH    ROS: no fevers or chills, productive cough, hemoptysis, dysphasia, odynophagia,  melena, hematochezia, dysuria, hematuria, rash, seizure activity, orthopnea, PND, pedal edema, claudication. Remaining systems are negative.  Physical Exam: Well-developed well-nourished in no acute distress.  Skin is warm and dry.  HEENT is normal.  Neck is supple.  Chest is clear to auscultation with normal expansion.  Cardiovascular exam is irregular Abdominal exam nontender or distended. No masses palpated. Extremities show no edema. neuro grossly intact  ECG 03/23/2013-atrial fibrillation, left ventricular hypertrophy with repolarization abnormality.

## 2013-04-05 NOTE — Telephone Encounter (Signed)
Discussed at office visit 04-05-13. Pt will take off the life vest today

## 2013-04-05 NOTE — Assessment & Plan Note (Signed)
Patient's followup echocardiogram showed normalization of his LV function. We will discontinue life vest.

## 2013-04-19 ENCOUNTER — Other Ambulatory Visit (INDEPENDENT_AMBULATORY_CARE_PROVIDER_SITE_OTHER): Payer: Medicare Other

## 2013-04-19 ENCOUNTER — Telehealth: Payer: Self-pay | Admitting: Cardiology

## 2013-04-19 DIAGNOSIS — I255 Ischemic cardiomyopathy: Secondary | ICD-10-CM

## 2013-04-19 DIAGNOSIS — I2589 Other forms of chronic ischemic heart disease: Secondary | ICD-10-CM

## 2013-04-19 DIAGNOSIS — E876 Hypokalemia: Secondary | ICD-10-CM

## 2013-04-19 LAB — BASIC METABOLIC PANEL
BUN: 26 mg/dL — ABNORMAL HIGH (ref 6–23)
CO2: 24 mEq/L (ref 19–32)
Chloride: 109 mEq/L (ref 96–112)
Creatinine, Ser: 1.5 mg/dL (ref 0.4–1.5)
Glucose, Bld: 114 mg/dL — ABNORMAL HIGH (ref 70–99)

## 2013-04-19 NOTE — Telephone Encounter (Signed)
Follow Up     Pts wife following up on pts test results. Please call.

## 2013-04-19 NOTE — Telephone Encounter (Signed)
Spoke with pt wife, she states the pt was out of potassium for three days. He has now restarted. He will take an extra 2 tablets today only. He will come to have a recheck of his potassium next week.

## 2013-05-12 ENCOUNTER — Other Ambulatory Visit: Payer: Self-pay | Admitting: Cardiology

## 2013-06-28 ENCOUNTER — Telehealth: Payer: Self-pay

## 2013-06-28 ENCOUNTER — Other Ambulatory Visit: Payer: Self-pay | Admitting: Cardiology

## 2013-06-28 MED ORDER — SPIRONOLACTONE 25 MG PO TABS: ORAL_TABLET | ORAL | Status: DC

## 2013-06-28 MED ORDER — FUROSEMIDE 20 MG PO TABS: 20.0000 mg | ORAL_TABLET | Freq: Every day | ORAL | Status: DC

## 2013-06-28 NOTE — Telephone Encounter (Signed)
Last INR 5/14 overdue for f/u.  Attempted to call pt LMOM TCB.

## 2013-06-28 NOTE — Telephone Encounter (Signed)
Pt has not been seen since May and states took last coumadin tablet on yesterday.Pt instructed would send in  Refill for 10 tablets and made an appt for him to be seen in clinic on Monday and he states understanding.

## 2013-07-03 ENCOUNTER — Ambulatory Visit (INDEPENDENT_AMBULATORY_CARE_PROVIDER_SITE_OTHER): Payer: Medicare Other | Admitting: Pharmacist

## 2013-07-03 DIAGNOSIS — I4891 Unspecified atrial fibrillation: Secondary | ICD-10-CM

## 2013-07-03 DIAGNOSIS — I469 Cardiac arrest, cause unspecified: Secondary | ICD-10-CM

## 2013-07-03 DIAGNOSIS — Z7901 Long term (current) use of anticoagulants: Secondary | ICD-10-CM

## 2013-07-03 DIAGNOSIS — E876 Hypokalemia: Secondary | ICD-10-CM

## 2013-07-03 MED ORDER — WARFARIN SODIUM 2.5 MG PO TABS: ORAL_TABLET | ORAL | Status: DC

## 2013-07-04 LAB — BASIC METABOLIC PANEL
BUN: 30 mg/dL — ABNORMAL HIGH (ref 6–23)
Calcium: 9.4 mg/dL (ref 8.4–10.5)
Creatinine, Ser: 1.9 mg/dL — ABNORMAL HIGH (ref 0.4–1.5)
GFR: 45.27 mL/min — ABNORMAL LOW (ref >60.00)
Glucose, Bld: 96 mg/dL (ref 70–99)
Potassium: 4.3 mEq/L (ref 3.5–5.1)

## 2013-07-04 NOTE — Telephone Encounter (Signed)
error 

## 2013-07-08 ENCOUNTER — Other Ambulatory Visit: Payer: Self-pay | Admitting: *Deleted

## 2013-07-08 ENCOUNTER — Telehealth: Payer: Self-pay | Admitting: Cardiology

## 2013-07-08 DIAGNOSIS — I10 Essential (primary) hypertension: Secondary | ICD-10-CM

## 2013-07-08 NOTE — Telephone Encounter (Signed)
Called patient concerning lab work results. He will return on 12/2 for a repeat BMET.

## 2013-07-08 NOTE — Telephone Encounter (Signed)
Follow Up   Pt is returning a call about blood work// Please call back

## 2013-08-05 ENCOUNTER — Ambulatory Visit (INDEPENDENT_AMBULATORY_CARE_PROVIDER_SITE_OTHER): Payer: Medicare Other | Admitting: *Deleted

## 2013-08-05 ENCOUNTER — Other Ambulatory Visit (INDEPENDENT_AMBULATORY_CARE_PROVIDER_SITE_OTHER): Payer: Medicare Other

## 2013-08-05 DIAGNOSIS — Z7901 Long term (current) use of anticoagulants: Secondary | ICD-10-CM

## 2013-08-05 DIAGNOSIS — I4891 Unspecified atrial fibrillation: Secondary | ICD-10-CM

## 2013-08-05 DIAGNOSIS — I10 Essential (primary) hypertension: Secondary | ICD-10-CM

## 2013-08-06 ENCOUNTER — Other Ambulatory Visit: Payer: Medicare Other

## 2013-08-06 LAB — BASIC METABOLIC PANEL
BUN: 37 mg/dL — ABNORMAL HIGH (ref 6–23)
Creatinine, Ser: 2.4 mg/dL — ABNORMAL HIGH (ref 0.4–1.5)
GFR: 35.24 mL/min — ABNORMAL LOW (ref >60.00)
Glucose, Bld: 111 mg/dL — ABNORMAL HIGH (ref 70–99)
Potassium: 5 mEq/L (ref 3.5–5.1)

## 2013-08-07 ENCOUNTER — Other Ambulatory Visit: Payer: Self-pay | Admitting: Nurse Practitioner

## 2013-08-09 ENCOUNTER — Other Ambulatory Visit: Payer: Self-pay | Admitting: *Deleted

## 2013-08-09 ENCOUNTER — Telehealth: Payer: Self-pay | Admitting: Cardiology

## 2013-08-09 DIAGNOSIS — N259 Disorder resulting from impaired renal tubular function, unspecified: Secondary | ICD-10-CM

## 2013-08-09 NOTE — Telephone Encounter (Signed)
New problem   Pt's wife stated someone called concerning her husband lab work. Pt is out of town and ask wife to call.

## 2013-08-09 NOTE — Telephone Encounter (Signed)
Spoke with pt wife, aware of med change and need for repeat labs

## 2013-08-13 ENCOUNTER — Other Ambulatory Visit: Payer: Self-pay | Admitting: Cardiology

## 2013-09-07 ENCOUNTER — Other Ambulatory Visit: Payer: Self-pay | Admitting: Nurse Practitioner

## 2013-09-13 ENCOUNTER — Other Ambulatory Visit: Payer: Self-pay | Admitting: Cardiology

## 2013-09-16 IMAGING — CR DG CHEST 1V PORT
1 series · 1 of 1 positions shown · non-contrast
Comparison: Chest radiograph performed earlier today at [DATE] p.m.

CLINICAL DATA: Cardiac arrest; central line placement and
endotracheal tube placement.

PORTABLE CHEST - 1 VIEW

[AP]
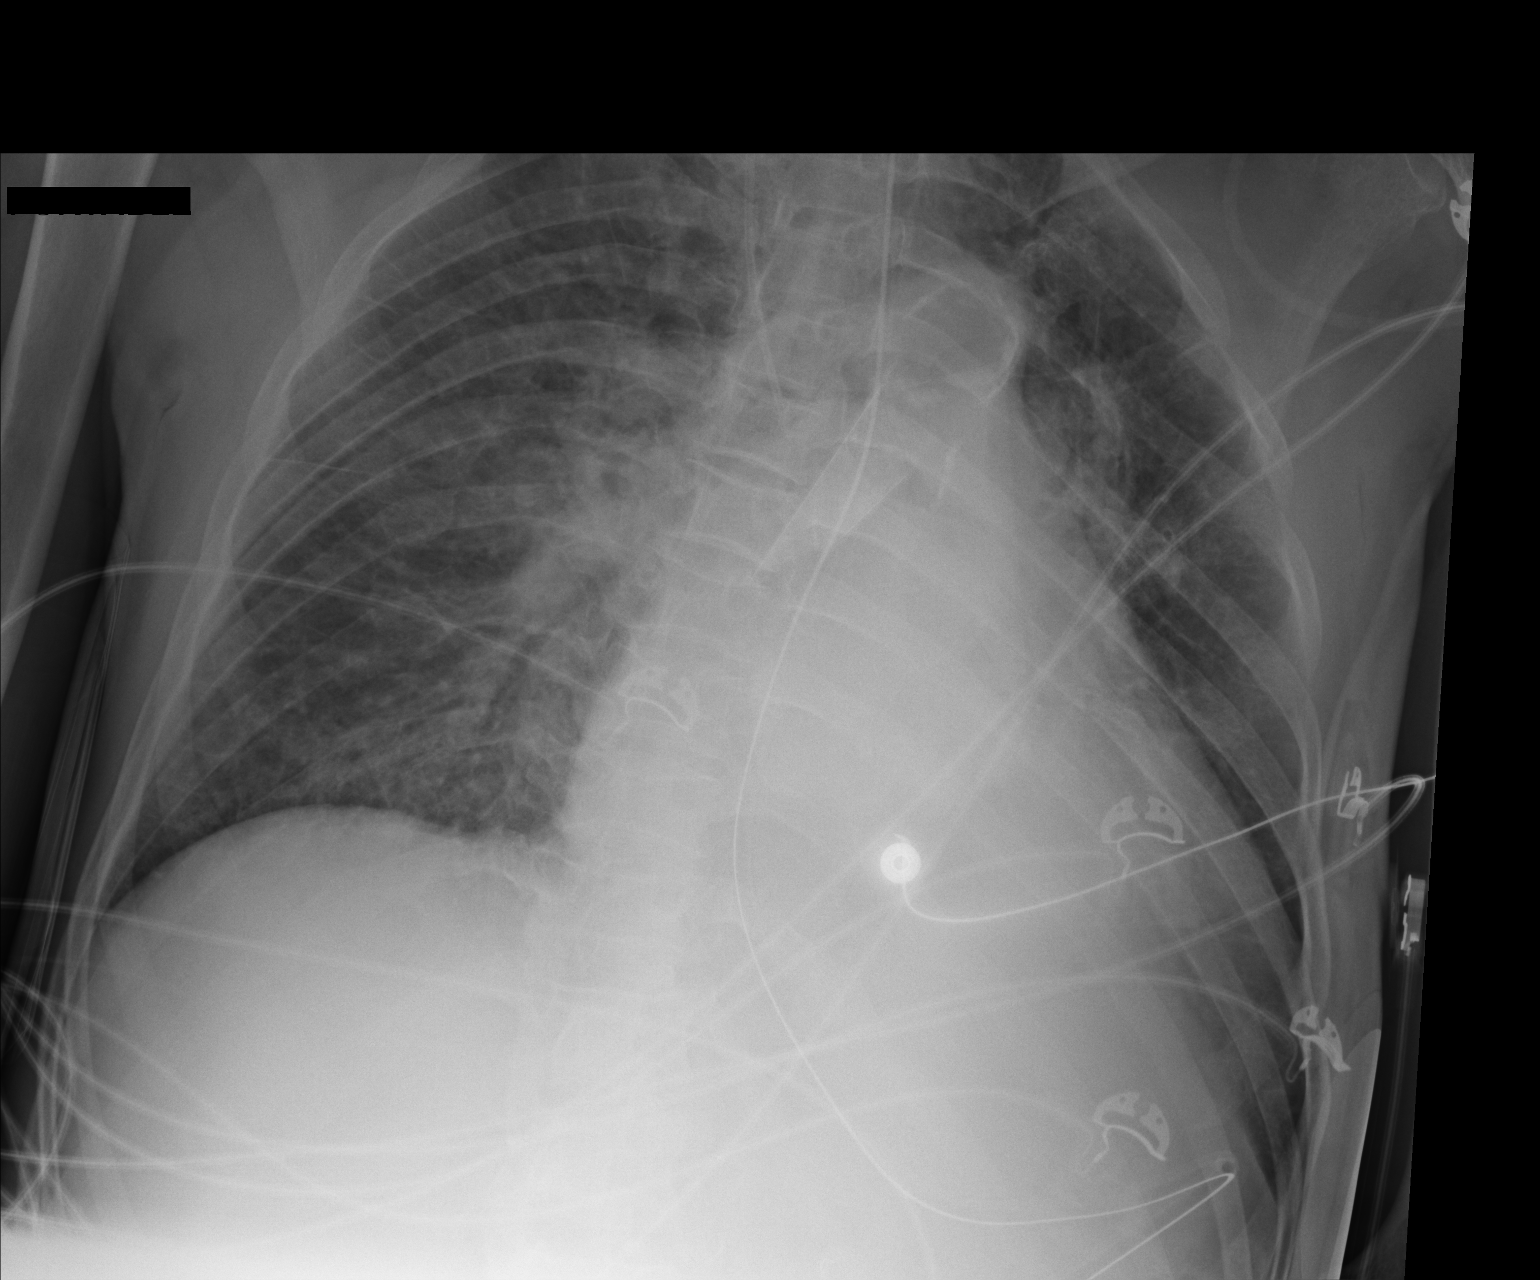

[1 of 1 positions shown; findings below may reference images not displayed]

FINDINGS: The patient's endotracheal tube is now seen ending 5-6 cm
above the carina.  An enteric tube is seen extending below the
diaphragm.  A right IJ line is noted ending overlying the mid SVC.

The lungs remain relatively well expanded.  There is worsening
dense consolidation at the left lung base.  Vascular congestion is
again noted, with increasing interstitial markings, raising concern
for mild interstitial edema.  No definite pleural effusion or
pneumothorax is seen.

The cardiomediastinal silhouette is borderline normal in size.  No
acute osseous abnormalities are identified.
IMPRESSION: 1.  Endotracheal tube seen ending 5-6 cm above the carina.
2.  Right IJ line seen ending overlying the mid SVC.
3.  Worsening dense consolidation at the left lung base, likely
reflect atelectasis.
4.  Mild interstitial edema appears slightly worsened from the
prior study.

## 2013-09-20 ENCOUNTER — Encounter: Payer: Self-pay | Admitting: Cardiology

## 2013-09-20 ENCOUNTER — Ambulatory Visit (INDEPENDENT_AMBULATORY_CARE_PROVIDER_SITE_OTHER): Payer: Medicare Other | Admitting: Cardiology

## 2013-09-20 ENCOUNTER — Ambulatory Visit: Payer: Medicare Other | Admitting: Cardiology

## 2013-09-20 ENCOUNTER — Ambulatory Visit (INDEPENDENT_AMBULATORY_CARE_PROVIDER_SITE_OTHER): Payer: Medicare Other | Admitting: *Deleted

## 2013-09-20 ENCOUNTER — Encounter: Payer: Self-pay | Admitting: *Deleted

## 2013-09-20 VITALS — BP 124/88 | HR 91 | Ht 75.0 in | Wt 198.0 lb

## 2013-09-20 DIAGNOSIS — I10 Essential (primary) hypertension: Secondary | ICD-10-CM

## 2013-09-20 DIAGNOSIS — I4891 Unspecified atrial fibrillation: Secondary | ICD-10-CM

## 2013-09-20 DIAGNOSIS — Z7901 Long term (current) use of anticoagulants: Secondary | ICD-10-CM

## 2013-09-20 DIAGNOSIS — E785 Hyperlipidemia, unspecified: Secondary | ICD-10-CM | POA: Diagnosis not present

## 2013-09-20 DIAGNOSIS — I679 Cerebrovascular disease, unspecified: Secondary | ICD-10-CM | POA: Diagnosis not present

## 2013-09-20 DIAGNOSIS — I2589 Other forms of chronic ischemic heart disease: Secondary | ICD-10-CM

## 2013-09-20 DIAGNOSIS — I255 Ischemic cardiomyopathy: Secondary | ICD-10-CM

## 2013-09-20 DIAGNOSIS — I2510 Atherosclerotic heart disease of native coronary artery without angina pectoris: Secondary | ICD-10-CM

## 2013-09-20 LAB — BASIC METABOLIC PANEL
BUN: 26 mg/dL — ABNORMAL HIGH (ref 6–23)
CALCIUM: 9.1 mg/dL (ref 8.4–10.5)
CO2: 20 meq/L (ref 19–32)
Chloride: 109 mEq/L (ref 96–112)
Creatinine, Ser: 1.8 mg/dL — ABNORMAL HIGH (ref 0.4–1.5)
GFR: 49.42 mL/min — AB (ref >60.00)
GLUCOSE: 114 mg/dL — AB (ref 70–99)
POTASSIUM: 3.4 meq/L — AB (ref 3.5–5.1)
SODIUM: 137 meq/L (ref 135–145)

## 2013-09-20 LAB — POCT INR: INR: 3.3

## 2013-09-20 NOTE — Assessment & Plan Note (Signed)
Given infarct in April of 2014 we'll continue Plavix. Discontinue when he returns in 6 months. Continue statin.

## 2013-09-20 NOTE — Assessment & Plan Note (Signed)
Patient remains in permanent atrial fibrillation. Continue Toprol for rate control. Continue Coumadin.

## 2013-09-20 NOTE — Assessment & Plan Note (Signed)
Continue statin. 

## 2013-09-20 NOTE — Assessment & Plan Note (Signed)
Continue statin. Schedule followup carotid Dopplers.

## 2013-09-20 NOTE — Progress Notes (Signed)
HPI: FU previous ventricular fibrillation arrest, coronary disease, ischemic cardiomyopathy and atrial fibrillation. Patient with a history of PCI of his LAD in October 2004. Last carotid Doppler performed in May of 2014 showed 60-79% bilateral stenosis. Followup was recommended in 6 months. He was admitted in June of 2013 following a ventricular fibrillation arrest that was felt related to hypokalemia (K 2.2). Patient improved following that episode. Patient admitted in April of 2014 following a ventricular fibrillation arrest. Cardiac catheterization in April of 2014 showed a patent stent in the LAD with a 40% in-stent restenosis. There was an apical 80% lesion. The circumflex was occluded. The right coronary artery was small and nondominant. The patient had attempt at PCI of the occluded circumflex but was unsuccessful. Echocardiogram in April of 2014 showed an ejection fraction of 30-35%, mild left atrial enlargement, moderately reduced RV function and mild mitral regurgitation. Patient was seen by Dr. Lovena Le of electrophysiology and ICD felt not indicated at this time because the patient did suffer a non-ST elevation myocardial infarction with positive enzymes. He was discharged with a life vest. Echocardiogram repeated in July of 2014. There was mild left ventricular hypertrophy and his ejection fraction was 55-60%. There was biatrial enlargement and mild mitral regurgitation. Since he was last seen in August 2014, no dyspnea, chest pain, palpitations or syncope. No pedal edema.   Current Outpatient Prescriptions  Medication Sig Dispense Refill  . atorvastatin (LIPITOR) 80 MG tablet TAKE 1 TABLET (80 MG TOTAL) BY MOUTH DAILY AT 6 PM.  30 tablet  5  . clopidogrel (PLAVIX) 75 MG tablet TAKE 1 TABLET (75 MG TOTAL) BY MOUTH DAILY.  30 tablet  5  . furosemide (LASIX) 20 MG tablet Take 1 tablet (20 mg total) by mouth daily.  30 tablet  6  . lisinopril (PRINIVIL,ZESTRIL) 2.5 MG tablet Take 1 tablet  (2.5 mg total) by mouth daily.  90 tablet  3  . metoprolol succinate (TOPROL-XL) 25 MG 24 hr tablet TAKE 3 TABLETS (75 MG TOTAL) BY MOUTH DAILY.  90 tablet  5  . nitroGLYCERIN (NITROSTAT) 0.4 MG SL tablet Place 1 tablet (0.4 mg total) under the tongue every 5 (five) minutes as needed for chest pain (Up to 3 doses).  25 tablet  3  . potassium chloride SA (K-DUR,KLOR-CON) 20 MEQ tablet TAKE 1 TABLET (20 MEQ TOTAL) BY MOUTH DAILY.  30 tablet  5  . warfarin (COUMADIN) 2.5 MG tablet Take as directed by coumadin clinic  30 tablet  3  . [DISCONTINUED] famotidine (PEPCID) 20 MG tablet Take 1 tablet (20 mg total) by mouth 2 (two) times daily.  60 tablet  11  . [DISCONTINUED] hydrALAZINE (APRESOLINE) 50 MG tablet Take 50 mg by mouth 3 (three) times daily.         No current facility-administered medications for this visit.     Past Medical History  Diagnosis Date  . Inguinal hernia without mention of obstruction or gangrene, unilateral or unspecified, (not specified as recurrent)   . 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 Peripheral vascular disease, unspecified   . Hyperlipidemia     mixed  . Hypertension     a. echo 4/12: EF 50%, asymmetric septal hypertrophy, no SAM or LVOT gradient, LAE, PASP 35  . Coronary artery disease     a. s/p aborted ant STEMI tx with Cypher DES to LAD 10/04 (residual at cath: D1 50%, CFX 40% and multiple dist 70%, EF 55%);  b. myoview 3/10: Ef 47%, infero-apical isch, LOW RISK - med Tx recommended  . CKD (chronic kidney disease)   . Atrial fibrillation 12/2010  . Diverticulosis     2001  . Diabetes mellitus   . H/O: CVA (cardiovascular accident)   . Carotid artery stenosis     01/2011 - 40-59% bilateral stenosis  . Peripheral vascular disease     h/o LE angioplasty  . Cardiac arrest - ventricular fibrillation     03/2012 in setting of hypokalemia (prolonged hosp with VDRF, tracheobronchitis, ARF, shock liver, PAF, AMS felt  secondary to post-anoxic encephalopathy/shock)  . Ischemic cardiomyopathy     a. EF 50-55% by echo 03/09/12 (was 30% by echo 02/24/12)  . Mitral regurgitation     Mild by echo 03/2012  . Hematemesis     12/2010 felt 2/2 Mallory Weiss tear - pt could not afford colonscopy/EGD at that time so Coumadin was deferred. Coumadin initiated 03/2012 without any evidence for bleeding.  . Inguinal hernia   . Diverticulosis 2001  . QT prolongation   . Bradycardia     Requiring discontinuation of use of BB/CCB  . Elevated LFTs     Shock liver 03/2012    Past Surgical History  Procedure Laterality Date  . Popliteal artery angioplasty  01/07/07    s/p LEFT POPLITEAL ARTERY EXPLORATION AND VEIN PATCH ANGIOPLASTY, PER DR. EARLY, SECONDARY TO ISCHEMIC LEFT FOOT RELATED TO LEFT POPLITEAL ARTERY INJURY  . Orif tibia & fibula fractures  01/11/07    OPEN TX OF UNICONDYLAR PLATEAU FRACTURE, IRRIGATION/DEBRIDEMENT OF OPEN FRACTURE INCLUDING BONE, REMOVAL OF EXTERNAL FIXATOR UNDER ANESTHESIA PER DR. MICHAEL HANDY  . Hernia repair    . Skin graft      History   Social History  . Marital Status: Married    Spouse Name: N/A    Number of Children: 4  . Years of Education: N/A   Occupational History  . 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 Smoking status: Former Smoker -- 0.50 packs/day for 30 years    Types: Cigarettes    Quit date: 03/12/2012  . Smokeless tobacco: Not on file  . Alcohol Use: No  . Drug Use: No  . Sexual Activity: Not on file   Other Topics Concern  . 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 YES. 1/2 -1 PPD OF CIGARETTES    NO ETOH    ROS: no fevers or chills, productive cough, hemoptysis, dysphasia, odynophagia, melena, hematochezia, dysuria, hematuria, rash, seizure activity, orthopnea, PND, pedal edema, claudication. Remaining systems are  negative.  Physical Exam: Well-developed well-nourished in no acute distress.  Skin is warm and dry.  HEENT is normal.  Neck is supple.  Chest is clear to auscultation with normal expansion.  Cardiovascular exam is irregular Abdominal exam nontender or distended. No masses palpated. Extremities show no edema. neuro grossly intact  ECG at a rate of 91. Occasional PVCs or aberrantly conducted beats. Left ventricular hypertrophy with repolarization abnormality.

## 2013-09-20 NOTE — Assessment & Plan Note (Signed)
LV function improved on most recent echo. Continue ACE inhibitor and beta blocker.

## 2013-09-20 NOTE — Assessment & Plan Note (Signed)
Blood pressure controlled. Continue present medications. Check potassium and renal function. 

## 2013-09-20 NOTE — Patient Instructions (Signed)
Your physician wants you to follow-up in: [place] will receive a reminder letter in the mail two months in advance. If you don't receive a letter, please call our office to schedule the follow-up appointment.   Your physician has requested that you have a carotid duplex. This test is an ultrasound of the carotid arteries in your neck. It looks at blood flow through these arteries that supply the brain with blood. Allow one hour for this exam. There are no restrictions or special instructions.   Your physician recommends that you HAVE LAB WORK TODAY

## 2013-09-21 IMAGING — CR DG CHEST 1V PORT
1 series · 1 of 1 positions shown · non-contrast
Comparison: 02/26/2012

CLINICAL DATA: Respiratory failure.

PORTABLE CHEST - 1 VIEW

[AP]
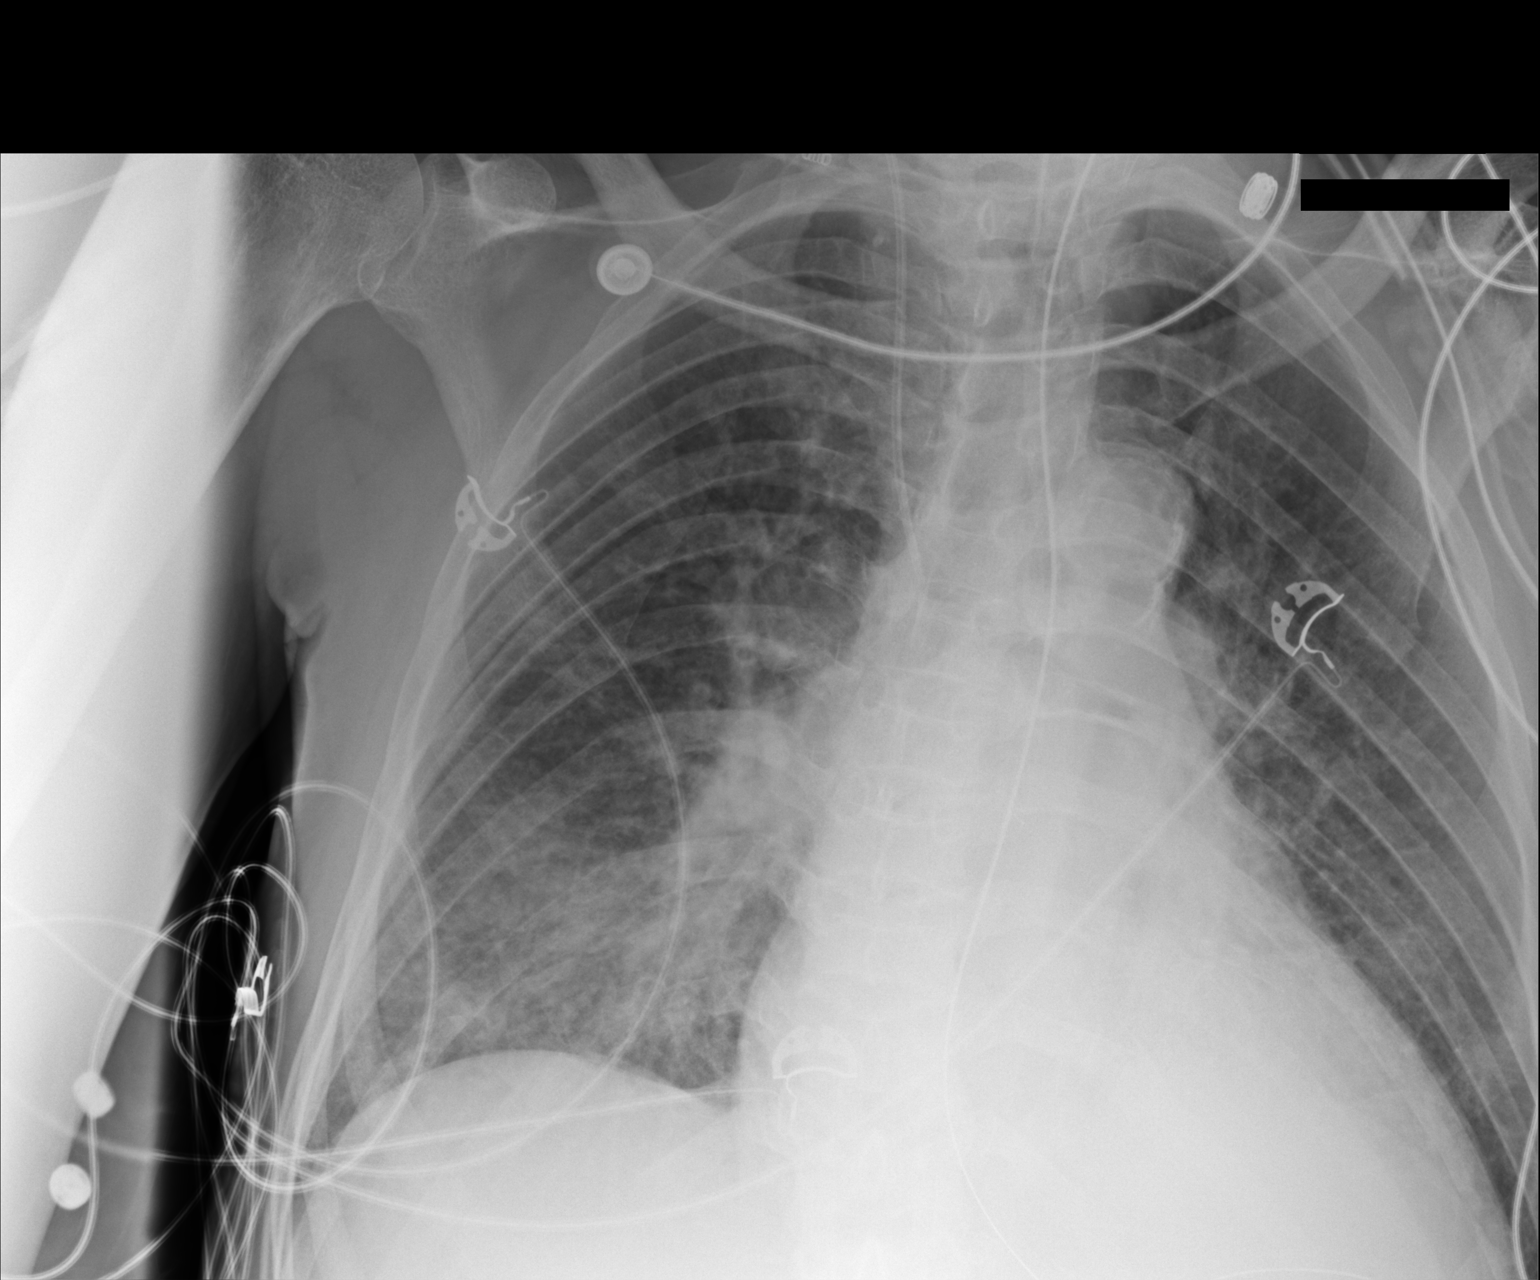

[1 of 1 positions shown; findings below may reference images not displayed]

FINDINGS: Interval extubation.  Cardiomegaly bilateral perihilar
and lower lobe opacities.  Mild interstitial prominence.
Interstitial disease is improved slightly since prior study.
IMPRESSION: Improving interstitial edema.  Continued confluent opacities in the
lower lobe, edema versus infection.

## 2013-09-24 ENCOUNTER — Encounter (HOSPITAL_COMMUNITY): Payer: Medicare Other

## 2013-09-24 DIAGNOSIS — R0989 Other specified symptoms and signs involving the circulatory and respiratory systems: Secondary | ICD-10-CM

## 2013-10-09 ENCOUNTER — Encounter (HOSPITAL_COMMUNITY): Payer: Self-pay | Admitting: Cardiology

## 2013-10-11 ENCOUNTER — Ambulatory Visit (INDEPENDENT_AMBULATORY_CARE_PROVIDER_SITE_OTHER): Payer: Medicare Other | Admitting: *Deleted

## 2013-10-11 DIAGNOSIS — I4891 Unspecified atrial fibrillation: Secondary | ICD-10-CM

## 2013-10-11 DIAGNOSIS — Z5181 Encounter for therapeutic drug level monitoring: Secondary | ICD-10-CM | POA: Diagnosis not present

## 2013-10-11 DIAGNOSIS — Z7901 Long term (current) use of anticoagulants: Secondary | ICD-10-CM | POA: Diagnosis not present

## 2013-10-11 LAB — POCT INR: INR: 2.9

## 2013-10-15 ENCOUNTER — Ambulatory Visit (HOSPITAL_COMMUNITY): Payer: Medicare Other | Attending: Internal Medicine

## 2013-10-15 ENCOUNTER — Encounter: Payer: Self-pay | Admitting: Internal Medicine

## 2013-10-15 DIAGNOSIS — I10 Essential (primary) hypertension: Secondary | ICD-10-CM | POA: Diagnosis not present

## 2013-10-15 DIAGNOSIS — I6529 Occlusion and stenosis of unspecified carotid artery: Secondary | ICD-10-CM | POA: Diagnosis not present

## 2013-10-15 DIAGNOSIS — F17200 Nicotine dependence, unspecified, uncomplicated: Secondary | ICD-10-CM | POA: Diagnosis not present

## 2013-10-15 DIAGNOSIS — I2510 Atherosclerotic heart disease of native coronary artery without angina pectoris: Secondary | ICD-10-CM | POA: Diagnosis not present

## 2013-10-15 DIAGNOSIS — I658 Occlusion and stenosis of other precerebral arteries: Secondary | ICD-10-CM | POA: Insufficient documentation

## 2013-10-15 DIAGNOSIS — I739 Peripheral vascular disease, unspecified: Secondary | ICD-10-CM | POA: Diagnosis not present

## 2013-10-15 DIAGNOSIS — E785 Hyperlipidemia, unspecified: Secondary | ICD-10-CM | POA: Diagnosis not present

## 2013-10-15 DIAGNOSIS — I679 Cerebrovascular disease, unspecified: Secondary | ICD-10-CM

## 2013-11-08 ENCOUNTER — Ambulatory Visit (INDEPENDENT_AMBULATORY_CARE_PROVIDER_SITE_OTHER): Payer: Medicare Other | Admitting: *Deleted

## 2013-11-08 DIAGNOSIS — Z7901 Long term (current) use of anticoagulants: Secondary | ICD-10-CM | POA: Diagnosis not present

## 2013-11-08 DIAGNOSIS — Z5181 Encounter for therapeutic drug level monitoring: Secondary | ICD-10-CM | POA: Diagnosis not present

## 2013-11-08 DIAGNOSIS — I4891 Unspecified atrial fibrillation: Secondary | ICD-10-CM | POA: Diagnosis not present

## 2013-11-08 LAB — POCT INR: INR: 3.7

## 2013-11-22 ENCOUNTER — Ambulatory Visit (INDEPENDENT_AMBULATORY_CARE_PROVIDER_SITE_OTHER): Payer: Medicare Other | Admitting: Pharmacist

## 2013-11-22 DIAGNOSIS — I4891 Unspecified atrial fibrillation: Secondary | ICD-10-CM

## 2013-11-22 DIAGNOSIS — Z7901 Long term (current) use of anticoagulants: Secondary | ICD-10-CM | POA: Diagnosis not present

## 2013-11-22 DIAGNOSIS — Z5181 Encounter for therapeutic drug level monitoring: Secondary | ICD-10-CM | POA: Diagnosis not present

## 2013-11-22 LAB — POCT INR: INR: 3.4

## 2013-12-06 ENCOUNTER — Ambulatory Visit (INDEPENDENT_AMBULATORY_CARE_PROVIDER_SITE_OTHER): Payer: Medicare Other | Admitting: *Deleted

## 2013-12-06 DIAGNOSIS — Z7901 Long term (current) use of anticoagulants: Secondary | ICD-10-CM

## 2013-12-06 DIAGNOSIS — I4891 Unspecified atrial fibrillation: Secondary | ICD-10-CM

## 2013-12-06 DIAGNOSIS — Z5181 Encounter for therapeutic drug level monitoring: Secondary | ICD-10-CM | POA: Diagnosis not present

## 2013-12-06 LAB — POCT INR: INR: 2.2

## 2014-01-08 ENCOUNTER — Other Ambulatory Visit: Payer: Self-pay | Admitting: Cardiology

## 2014-02-05 ENCOUNTER — Other Ambulatory Visit: Payer: Self-pay | Admitting: Cardiology

## 2014-02-07 ENCOUNTER — Other Ambulatory Visit: Payer: Self-pay | Admitting: *Deleted

## 2014-02-07 ENCOUNTER — Ambulatory Visit (INDEPENDENT_AMBULATORY_CARE_PROVIDER_SITE_OTHER): Payer: Medicare Other

## 2014-02-07 DIAGNOSIS — I4891 Unspecified atrial fibrillation: Secondary | ICD-10-CM | POA: Diagnosis not present

## 2014-02-07 DIAGNOSIS — Z7901 Long term (current) use of anticoagulants: Secondary | ICD-10-CM | POA: Diagnosis not present

## 2014-02-07 DIAGNOSIS — Z5181 Encounter for therapeutic drug level monitoring: Secondary | ICD-10-CM

## 2014-02-07 LAB — POCT INR: INR: 1.9

## 2014-02-07 MED ORDER — WARFARIN SODIUM 2.5 MG PO TABS: 2.5000 mg | ORAL_TABLET | ORAL | Status: DC

## 2014-02-13 ENCOUNTER — Other Ambulatory Visit: Payer: Self-pay | Admitting: Cardiology

## 2014-02-21 ENCOUNTER — Ambulatory Visit (INDEPENDENT_AMBULATORY_CARE_PROVIDER_SITE_OTHER): Payer: Medicare Other | Admitting: Pharmacist

## 2014-02-21 DIAGNOSIS — Z5181 Encounter for therapeutic drug level monitoring: Secondary | ICD-10-CM | POA: Diagnosis not present

## 2014-02-21 DIAGNOSIS — Z7901 Long term (current) use of anticoagulants: Secondary | ICD-10-CM

## 2014-02-21 DIAGNOSIS — I4891 Unspecified atrial fibrillation: Secondary | ICD-10-CM | POA: Diagnosis not present

## 2014-02-21 LAB — POCT INR: INR: 2.3

## 2014-03-11 ENCOUNTER — Other Ambulatory Visit: Payer: Self-pay | Admitting: Cardiology

## 2014-03-14 ENCOUNTER — Ambulatory Visit (INDEPENDENT_AMBULATORY_CARE_PROVIDER_SITE_OTHER): Payer: Medicare Other | Admitting: Pharmacist

## 2014-03-14 DIAGNOSIS — Z7901 Long term (current) use of anticoagulants: Secondary | ICD-10-CM | POA: Diagnosis not present

## 2014-03-14 DIAGNOSIS — I4891 Unspecified atrial fibrillation: Secondary | ICD-10-CM

## 2014-03-14 DIAGNOSIS — Z5181 Encounter for therapeutic drug level monitoring: Secondary | ICD-10-CM | POA: Diagnosis not present

## 2014-03-14 LAB — POCT INR: INR: 2

## 2014-03-19 IMAGING — US US RENAL PORT
1 series · 14 of 25 positions shown · non-contrast
Comparison: CT chest abdomen pelvis 01/07/2007

CLINICAL DATA: Worsening renal function, evaluate for
hydronephrosis

RENAL/URINARY TRACT ULTRASOUND COMPLETE

[Series 1: us renal port · 0.31mm/px · 14 of 38 slices shown]
[im 1/38]
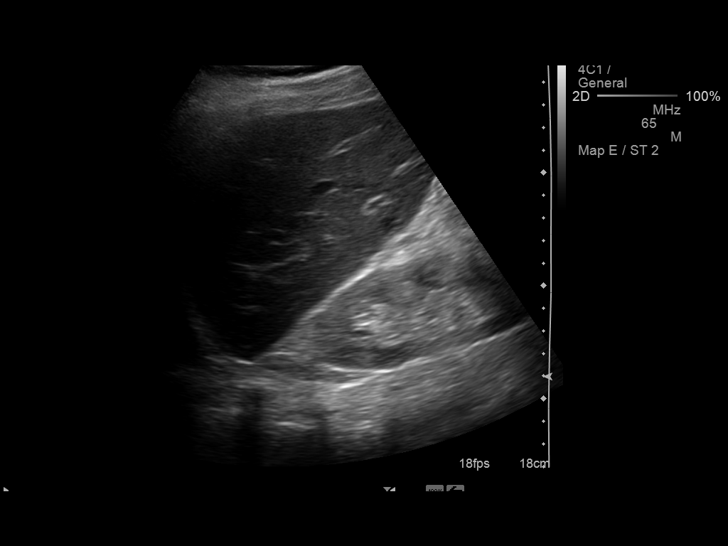
[im 4/38]
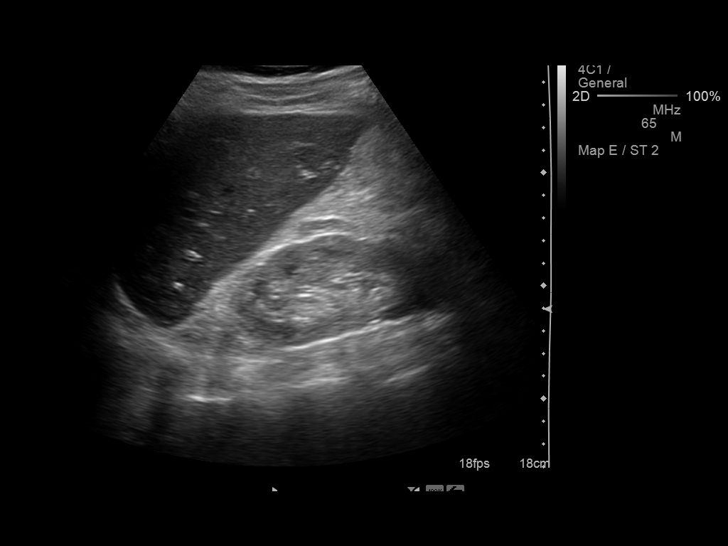
[im 7/38]
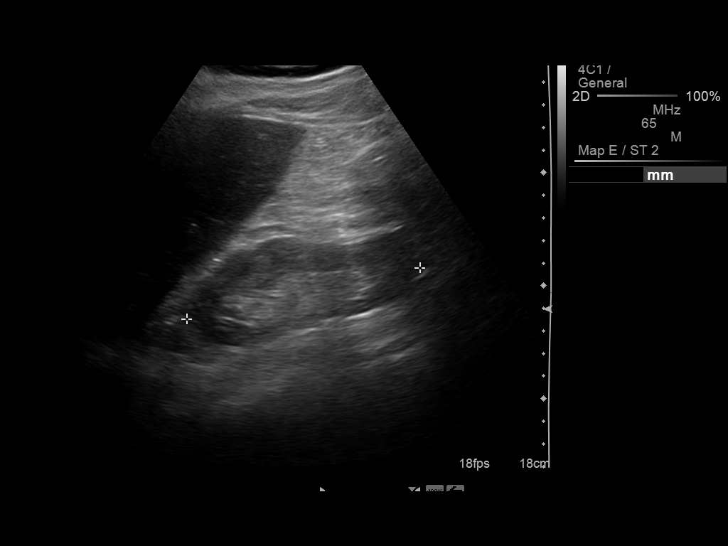
[im 10/38]
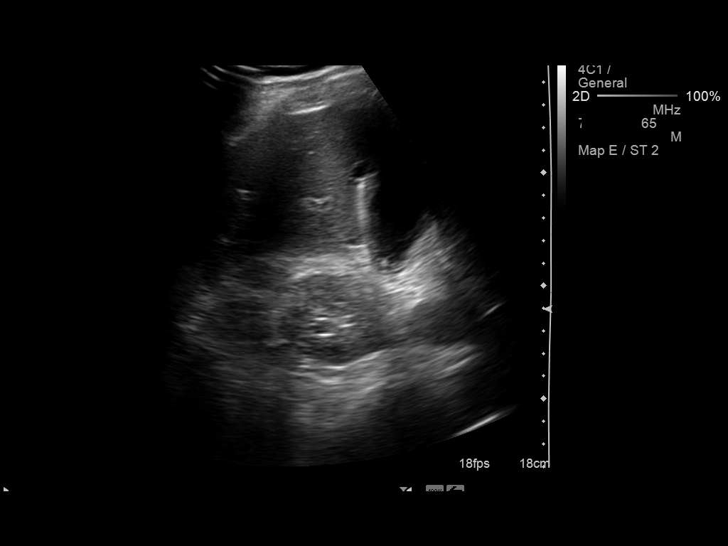
[im 13/38]
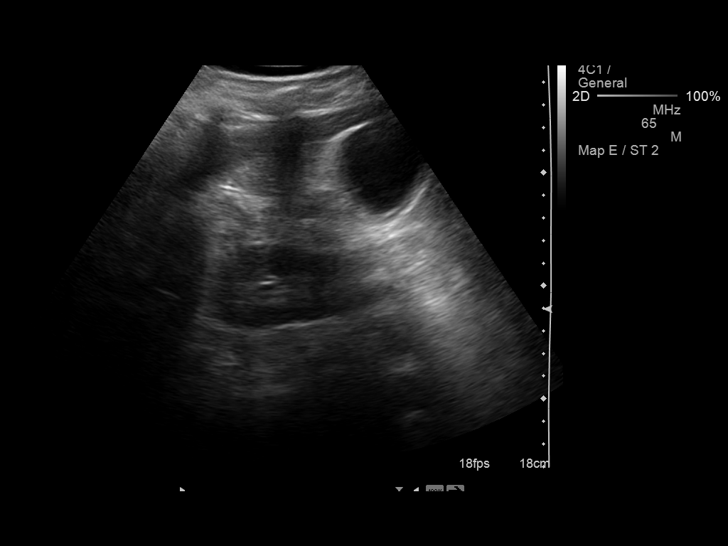
[im 14/38]
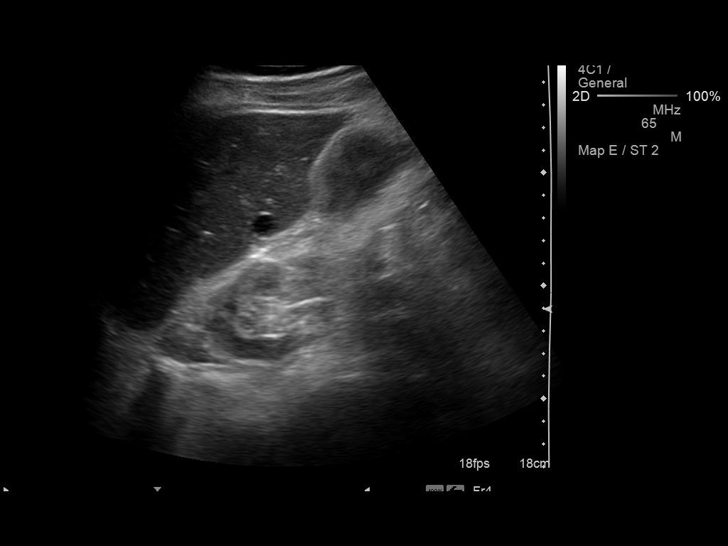
[im 17/38]
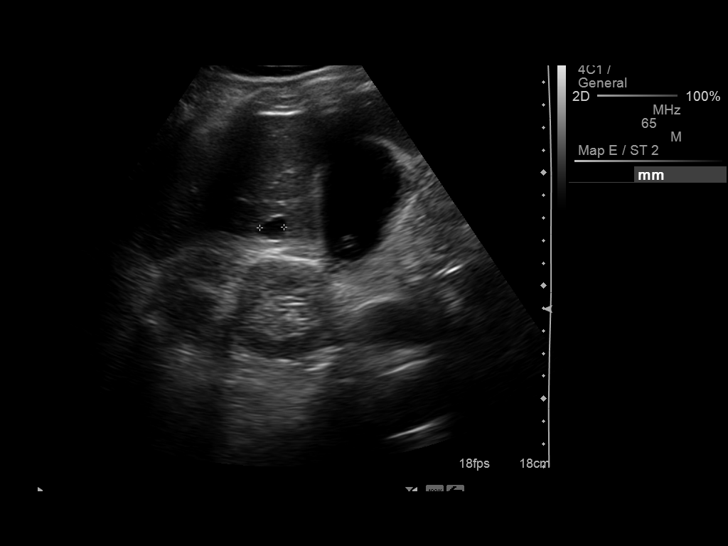
[im 21/38]
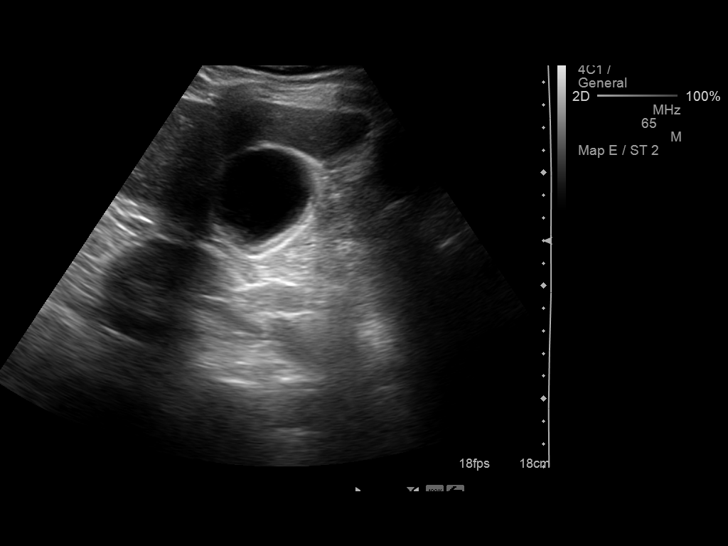
[im 24/38]
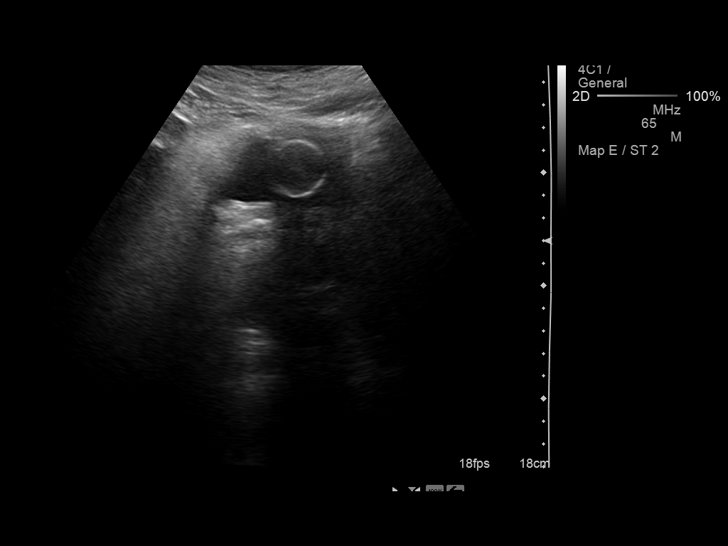
[im 25/38]
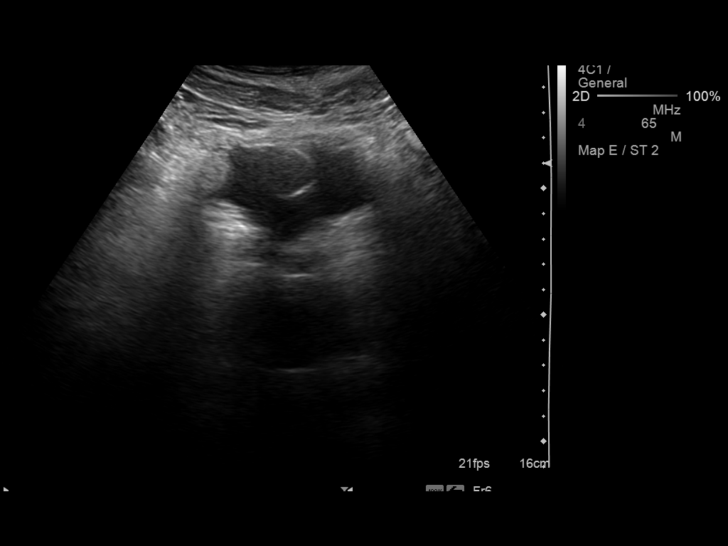
[im 28/38]
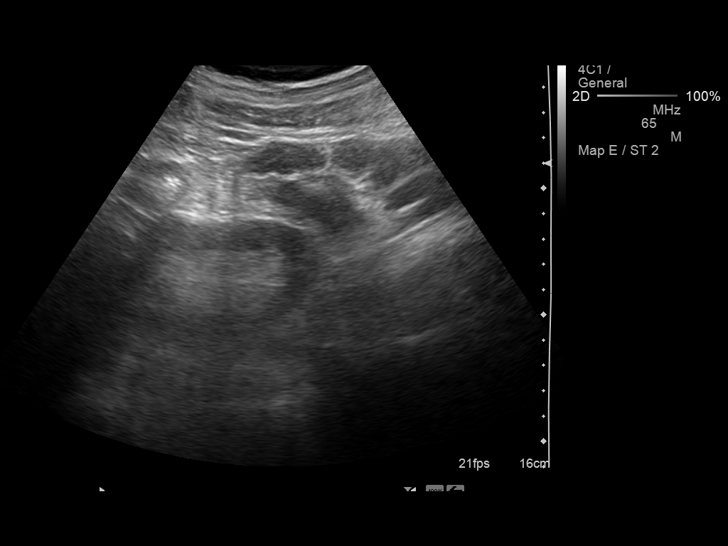
[im 31/38]
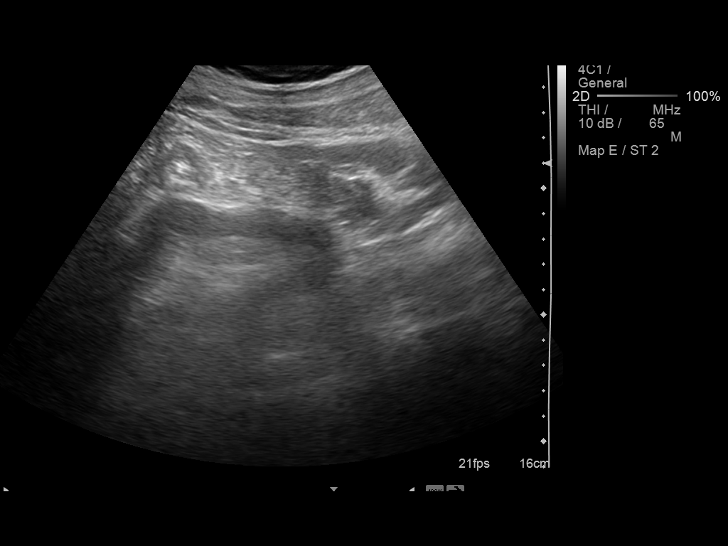
[im 34/38]
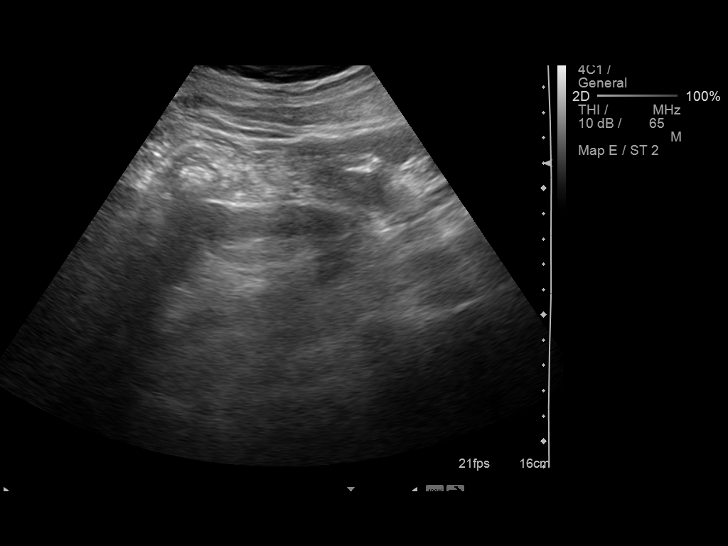
[im 38/38]
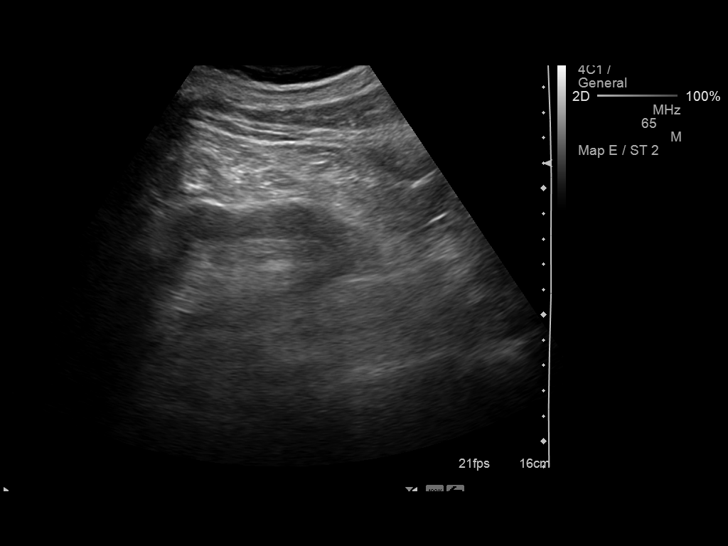

[14 of 25 positions shown; findings below may reference images not displayed]

FINDINGS: Right Kidney:  Diffusely echogenic renal parenchyma.  The contour
is normal and reniform.  No hydronephrosis or focal renal lesion.
The kidney measures 10.6 cm in length.

Left Kidney:  Diffusely echogenic renal parenchyma.  The renal
contour is normal.  No hydronephrosis or focal lesion.  The kidney
measures 10.5 cm in length.

Bladder:  Foley catheter in the collapsed bladder.

Other:  Small 1.1 x 1.2 cm cyst in hepatic segment five of the
liver adjacent to the gallbladder fossa.  Cholelithiasis is noted.
There is also mild gallbladder wall thickening.
IMPRESSION: 1.  No hydronephrosis.
2.  Echogenic renal parenchyma consistent with underlying medical
renal disease
3.  Cholelithiasis with gallbladder wall thickening. Query symptoms
of chronic cholecystitis.

## 2014-03-20 IMAGING — CR DG CHEST 1V PORT
1 series · 1 of 1 positions shown · non-contrast
Comparison: 01/03/2013.

CLINICAL DATA: Endotracheal tube placement.

PORTABLE CHEST - 1 VIEW

[AP]
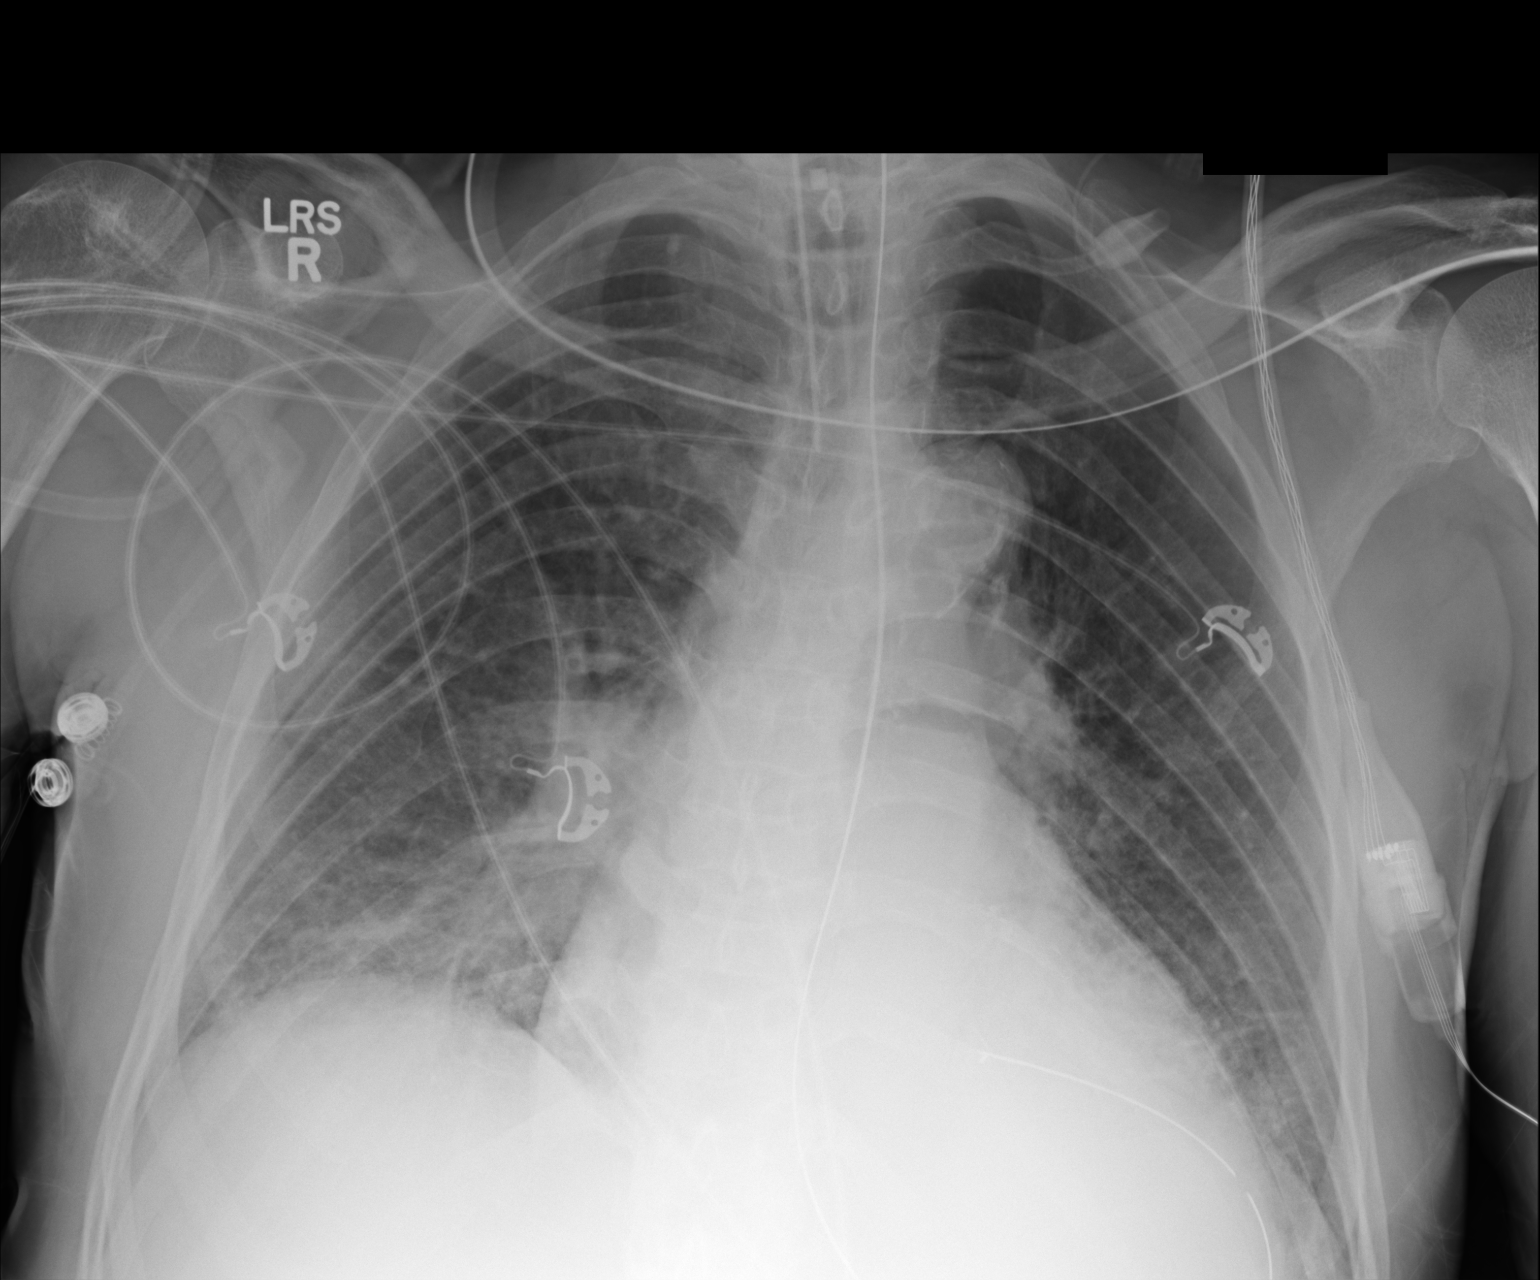

[1 of 1 positions shown; findings below may reference images not displayed]

FINDINGS: Endotracheal tube is in satisfactory position.
Nasogastric tube is followed into the stomach.  Heart size stable.
There is diffuse bilateral air space disease, bibasilar dependent.
No definite pleural fluid.
IMPRESSION: Pulmonary edema, stable.

## 2014-03-27 ENCOUNTER — Ambulatory Visit (INDEPENDENT_AMBULATORY_CARE_PROVIDER_SITE_OTHER): Payer: Medicare Other | Admitting: Cardiology

## 2014-03-27 ENCOUNTER — Ambulatory Visit (INDEPENDENT_AMBULATORY_CARE_PROVIDER_SITE_OTHER): Payer: Medicare Other | Admitting: Pharmacist Clinician (PhC)/ Clinical Pharmacy Specialist

## 2014-03-27 ENCOUNTER — Encounter: Payer: Self-pay | Admitting: Cardiology

## 2014-03-27 VITALS — BP 132/90 | HR 85 | Ht 75.5 in | Wt 201.3 lb

## 2014-03-27 DIAGNOSIS — I679 Cerebrovascular disease, unspecified: Secondary | ICD-10-CM

## 2014-03-27 DIAGNOSIS — E785 Hyperlipidemia, unspecified: Secondary | ICD-10-CM | POA: Diagnosis not present

## 2014-03-27 DIAGNOSIS — I2510 Atherosclerotic heart disease of native coronary artery without angina pectoris: Secondary | ICD-10-CM | POA: Diagnosis not present

## 2014-03-27 DIAGNOSIS — I255 Ischemic cardiomyopathy: Secondary | ICD-10-CM

## 2014-03-27 DIAGNOSIS — Z5181 Encounter for therapeutic drug level monitoring: Secondary | ICD-10-CM | POA: Diagnosis not present

## 2014-03-27 DIAGNOSIS — I4891 Unspecified atrial fibrillation: Secondary | ICD-10-CM | POA: Diagnosis not present

## 2014-03-27 DIAGNOSIS — Z7901 Long term (current) use of anticoagulants: Secondary | ICD-10-CM | POA: Diagnosis not present

## 2014-03-27 DIAGNOSIS — I2589 Other forms of chronic ischemic heart disease: Secondary | ICD-10-CM

## 2014-03-27 DIAGNOSIS — I739 Peripheral vascular disease, unspecified: Secondary | ICD-10-CM

## 2014-03-27 DIAGNOSIS — I482 Chronic atrial fibrillation, unspecified: Secondary | ICD-10-CM

## 2014-03-27 LAB — POCT INR: INR: 2.3

## 2014-03-27 NOTE — Patient Instructions (Signed)
Your physician wants you to follow-up in: [place] will receive a reminder letter in the mail two months in advance. If you don't receive a letter, please call our office to schedule the follow-up appointment.   STOP PLAVIX  Your physician recommends that you HAVE LAB WORK TODAY

## 2014-03-27 NOTE — Assessment & Plan Note (Signed)
Patient remains in atrial fibrillation. Continue beta blocker for rate control. Continue Coumadin. Check hemoglobin.

## 2014-03-27 NOTE — Assessment & Plan Note (Signed)
Continue statin. Followup carotid Dopplers in August 2016.

## 2014-03-27 NOTE — Assessment & Plan Note (Signed)
Continue statin. 

## 2014-03-27 NOTE — Assessment & Plan Note (Signed)
Discontinue Plavix given need for Coumadin. Continue statin.

## 2014-03-27 NOTE — Assessment & Plan Note (Signed)
Continue statin. Check lipids and liver. 

## 2014-03-27 NOTE — Progress Notes (Signed)
HPI: FU Atrial fibrillation, CAD, previous V fib arrest and ischemic CM. Patient with a history of PCI of his LAD in October 2004. He was admitted in June of 2013 following a ventricular fibrillation arrest that was felt related to hypokalemia (K 2.2). Patient improved following that episode. Patient admitted in April of 2014 following a ventricular fibrillation arrest. Cardiac catheterization in April of 2014 showed a patent stent in the LAD with a 40% in-stent restenosis. There was an apical 80% lesion. The circumflex was occluded. The right coronary artery was small and nondominant. The patient had attempt at PCI of the occluded circumflex but was unsuccessful. Echocardiogram in April of 2014 showed an ejection fraction of 30-35%, mild left atrial enlargement, moderately reduced RV function and mild mitral regurgitation. Patient was seen by Dr. Lovena Le of electrophysiology and ICD felt not indicated at that time because the patient did suffer a non-ST elevation myocardial infarction with positive enzymes. He was discharged with a life vest. Echocardiogram repeated in July of 2014. There was mild left ventricular hypertrophy and his ejection fraction was 55-60%. There was biatrial enlargement and mild mitral regurgitation. Last carotid Doppler performed in Feb 2015 showed 60-79% bilateral stenosis. Followup was recommended in 6 months. Since he was last seen, no dyspnea, chest pain, palpitations or syncope. No pedal edema.    Current Outpatient Prescriptions  Medication Sig Dispense Refill  . atorvastatin (LIPITOR) 80 MG tablet TAKE ONE TABLET BY MOUTH ONE TIME DAILY at 6pm.  30 tablet  1  . clopidogrel (PLAVIX) 75 MG tablet TAKE ONE TABLET BY MOUTH ONE TIME DAILY   30 tablet  1  . furosemide (LASIX) 20 MG tablet TAKE ONE TABLET BY MOUTH ONE TIME DAILY   30 tablet  3  . metoprolol succinate (TOPROL-XL) 25 MG 24 hr tablet TAKE THREE TABLETS BY MOUTH DAILY   90 tablet  1  . nitroGLYCERIN  (NITROSTAT) 0.4 MG SL tablet Place 1 tablet (0.4 mg total) under the tongue every 5 (five) minutes as needed for chest pain (Up to 3 doses).  25 tablet  3  . potassium chloride SA (K-DUR,KLOR-CON) 20 MEQ tablet TAKE TWO TABLET BY MOUTH ONE TIME DAILY      . warfarin (COUMADIN) 2.5 MG tablet Take 1 tablet (2.5 mg total) by mouth as directed.  30 tablet  3  . [DISCONTINUED] famotidine (PEPCID) 20 MG tablet Take 1 tablet (20 mg total) by mouth 2 (two) times daily.  60 tablet  11  . [DISCONTINUED] hydrALAZINE (APRESOLINE) 50 MG tablet Take 50 mg by mouth 3 (three) times daily.         No current facility-administered medications for this visit.     Past Medical History  Diagnosis Date  . Inguinal hernia without mention of obstruction or gangrene, unilateral or unspecified, (not specified as recurrent)   . 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 Peripheral vascular disease, unspecified   . Hyperlipidemia     mixed  . Hypertension     a. echo 4/12: EF 50%, asymmetric septal hypertrophy, no SAM or LVOT gradient, LAE, PASP 35  . Coronary artery disease     a. s/p aborted ant STEMI tx with Cypher DES to LAD 10/04 (residual at cath: D1 50%, CFX 40% and multiple dist 70%, EF 55%);   b. myoview 3/10: Ef 47%, infero-apical isch, LOW RISK - med Tx recommended  . CKD (chronic kidney disease)   . Atrial fibrillation 12/2010  .  Diverticulosis     2001  . Diabetes mellitus   . H/O: CVA (cardiovascular accident)   . Carotid artery stenosis     01/2011 - 40-59% bilateral stenosis  . Peripheral vascular disease     h/o LE angioplasty  . 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 Ischemic cardiomyopathy     a. EF 50-55% by echo 03/09/12 (was 30% by echo 02/24/12)  . Mitral regurgitation     Mild by echo 03/2012    . Hematemesis     12/2010 felt 2/2 Mallory Weiss tear - pt could not afford colonscopy/EGD at that time so Coumadin was deferred. Coumadin initiated 03/2012 without any evidence for bleeding.  . Inguinal hernia   . Diverticulosis 2001  . QT prolongation   . Bradycardia     Requiring discontinuation of use of BB/CCB  . Elevated LFTs     Shock liver 03/2012    Past Surgical History  Procedure Laterality Date  . Popliteal artery angioplasty  01/07/07    s/p LEFT POPLITEAL ARTERY EXPLORATION AND VEIN PATCH ANGIOPLASTY, PER DR. EARLY, SECONDARY TO ISCHEMIC LEFT FOOT RELATED TO LEFT POPLITEAL ARTERY INJURY  . Orif tibia & fibula fractures  01/11/07    OPEN TX OF UNICONDYLAR PLATEAU FRACTURE, IRRIGATION/DEBRIDEMENT OF OPEN FRACTURE INCLUDING BONE, REMOVAL OF EXTERNAL FIXATOR UNDER ANESTHESIA PER DR. MICHAEL HANDY  . Hernia repair    . Skin graft      History   Social History  . Marital Status: Married    Spouse Name: N/A    Number of Children: 4  . Years of Education: N/A   Occupational History  . 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 Smoking status: Former Smoker -- 0.50 packs/day for 30 years    Types: Cigarettes    Quit date: 03/12/2012  . Smokeless tobacco: Not on file  . Alcohol Use: No  . Drug Use: No  . Sexual Activity: Not on file   Other Topics Concern  . 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 YES. 1/2 -1 PPD OF CIGARETTES    NO ETOH    ROS: no fevers or chills, productive cough, hemoptysis, dysphasia, odynophagia, melena, hematochezia, dysuria, hematuria, rash, seizure activity, orthopnea, PND, pedal edema, claudication. Remaining systems are negative.  Physical Exam: Well-developed well-nourished in no acute distress.  Skin is warm and dry.  HEENT is normal.  Neck is supple.  Chest is clear to auscultation with normal  expansion.  Cardiovascular exam is irregular Abdominal exam nontender or distended. No masses palpated. Extremities show no edema. neuro grossly intact  ECG Atrial fibrillation at a rate of 85. Left ventricular hypertrophy with repolarization abnormality.

## 2014-03-27 NOTE — Assessment & Plan Note (Signed)
Improved on most recent echocardiogram. Continue beta blocker.

## 2014-03-27 NOTE — Assessment & Plan Note (Signed)
Continue present blood pressure medications. Check potassium and renal function. 

## 2014-03-28 LAB — BASIC METABOLIC PANEL WITH GFR
BUN: 16 mg/dL (ref 6–23)
CALCIUM: 9.4 mg/dL (ref 8.4–10.5)
CO2: 24 meq/L (ref 19–32)
Chloride: 105 mEq/L (ref 96–112)
Creat: 1.37 mg/dL — ABNORMAL HIGH (ref 0.50–1.35)
GFR, EST AFRICAN AMERICAN: 60 mL/min
GFR, Est Non African American: 52 mL/min — ABNORMAL LOW
GLUCOSE: 112 mg/dL — AB (ref 70–99)
Potassium: 3.5 mEq/L (ref 3.5–5.3)
Sodium: 138 mEq/L (ref 135–145)

## 2014-03-28 LAB — CBC
HEMATOCRIT: 42.8 % (ref 39.0–52.0)
HEMOGLOBIN: 15.2 g/dL (ref 13.0–17.0)
MCH: 26 pg (ref 26.0–34.0)
MCHC: 35.5 g/dL (ref 30.0–36.0)
MCV: 73.2 fL — ABNORMAL LOW (ref 78.0–100.0)
Platelets: 254 10*3/uL (ref 150–400)
RBC: 5.85 MIL/uL — AB (ref 4.22–5.81)
RDW: 16.6 % — ABNORMAL HIGH (ref 11.5–15.5)
WBC: 6.2 10*3/uL (ref 4.0–10.5)

## 2014-03-28 LAB — HEPATIC FUNCTION PANEL
ALBUMIN: 4.4 g/dL (ref 3.5–5.2)
ALK PHOS: 185 U/L — AB (ref 39–117)
ALT: 19 U/L (ref 0–53)
AST: 24 U/L (ref 0–37)
BILIRUBIN TOTAL: 0.7 mg/dL (ref 0.2–1.2)
Bilirubin, Direct: 0.2 mg/dL (ref 0.0–0.3)
Indirect Bilirubin: 0.5 mg/dL (ref 0.2–1.2)
TOTAL PROTEIN: 7.6 g/dL (ref 6.0–8.3)

## 2014-03-28 LAB — LIPID PANEL
Cholesterol: 110 mg/dL (ref 0–200)
HDL: 33 mg/dL — ABNORMAL LOW (ref >39)
LDL CALC: 49 mg/dL (ref 0–99)
Total CHOL/HDL Ratio: 3.3 Ratio
Triglycerides: 140 mg/dL (ref <150)
VLDL: 28 mg/dL (ref 0–40)

## 2014-04-07 ENCOUNTER — Other Ambulatory Visit: Payer: Self-pay | Admitting: Cardiology

## 2014-04-25 ENCOUNTER — Ambulatory Visit (INDEPENDENT_AMBULATORY_CARE_PROVIDER_SITE_OTHER): Payer: Medicare Other | Admitting: Pharmacist Clinician (PhC)/ Clinical Pharmacy Specialist

## 2014-04-25 DIAGNOSIS — I4891 Unspecified atrial fibrillation: Secondary | ICD-10-CM | POA: Diagnosis not present

## 2014-04-25 DIAGNOSIS — I482 Chronic atrial fibrillation, unspecified: Secondary | ICD-10-CM

## 2014-04-25 DIAGNOSIS — Z7901 Long term (current) use of anticoagulants: Secondary | ICD-10-CM

## 2014-04-25 DIAGNOSIS — Z5181 Encounter for therapeutic drug level monitoring: Secondary | ICD-10-CM | POA: Diagnosis not present

## 2014-04-25 LAB — POCT INR: INR: 1.6

## 2014-05-13 ENCOUNTER — Telehealth: Payer: Self-pay | Admitting: Cardiology

## 2014-05-13 MED ORDER — ATORVASTATIN CALCIUM 80 MG PO TABS: 80.0000 mg | ORAL_TABLET | Freq: Every day | ORAL | Status: DC

## 2014-05-13 MED ORDER — POTASSIUM CHLORIDE CRYS ER 20 MEQ PO TBCR: 40.0000 meq | EXTENDED_RELEASE_TABLET | Freq: Every day | ORAL | Status: DC

## 2014-05-13 NOTE — Telephone Encounter (Signed)
Rx was sent to pharmacy electronically. 

## 2014-05-13 NOTE — Telephone Encounter (Signed)
Need a refill on his Potassium and his Atorvastatin.Please call to [facility] [phone].

## 2014-05-16 ENCOUNTER — Ambulatory Visit (INDEPENDENT_AMBULATORY_CARE_PROVIDER_SITE_OTHER): Payer: Medicare Other | Admitting: Pharmacist Clinician (PhC)/ Clinical Pharmacy Specialist

## 2014-05-16 DIAGNOSIS — I4891 Unspecified atrial fibrillation: Secondary | ICD-10-CM | POA: Diagnosis not present

## 2014-05-16 DIAGNOSIS — Z7901 Long term (current) use of anticoagulants: Secondary | ICD-10-CM | POA: Diagnosis not present

## 2014-05-16 DIAGNOSIS — Z5181 Encounter for therapeutic drug level monitoring: Secondary | ICD-10-CM

## 2014-05-16 DIAGNOSIS — I482 Chronic atrial fibrillation, unspecified: Secondary | ICD-10-CM

## 2014-05-16 LAB — POCT INR: INR: 1.6

## 2014-05-21 ENCOUNTER — Inpatient Hospital Stay (HOSPITAL_COMMUNITY): Admission: RE | Admit: 2014-05-21 | Payer: Medicare Other | Source: Ambulatory Visit

## 2014-05-27 ENCOUNTER — Other Ambulatory Visit (HOSPITAL_COMMUNITY): Payer: Self-pay | Admitting: Cardiology

## 2014-05-27 ENCOUNTER — Ambulatory Visit (HOSPITAL_COMMUNITY)
Admission: RE | Admit: 2014-05-27 | Discharge: 2014-05-27 | Disposition: A | Discharge status: Home / Self Care | Payer: Medicare Other | Source: Ambulatory Visit | Attending: Cardiovascular Disease | Admitting: Cardiovascular Disease

## 2014-05-27 DIAGNOSIS — I6529 Occlusion and stenosis of unspecified carotid artery: Secondary | ICD-10-CM | POA: Insufficient documentation

## 2014-05-27 DIAGNOSIS — I2510 Atherosclerotic heart disease of native coronary artery without angina pectoris: Secondary | ICD-10-CM

## 2014-05-27 DIAGNOSIS — I10 Essential (primary) hypertension: Secondary | ICD-10-CM | POA: Diagnosis not present

## 2014-05-27 DIAGNOSIS — E782 Mixed hyperlipidemia: Secondary | ICD-10-CM | POA: Diagnosis not present

## 2014-05-27 DIAGNOSIS — I739 Peripheral vascular disease, unspecified: Secondary | ICD-10-CM | POA: Diagnosis not present

## 2014-05-27 DIAGNOSIS — F17200 Nicotine dependence, unspecified, uncomplicated: Secondary | ICD-10-CM | POA: Insufficient documentation

## 2014-05-27 DIAGNOSIS — E119 Type 2 diabetes mellitus without complications: Secondary | ICD-10-CM | POA: Insufficient documentation

## 2014-05-27 NOTE — Progress Notes (Signed)
Carotid Duplex Completed. °Brianna L Mazza,RVT °

## 2014-06-06 ENCOUNTER — Ambulatory Visit (INDEPENDENT_AMBULATORY_CARE_PROVIDER_SITE_OTHER): Payer: Medicare Other | Admitting: Pharmacist Clinician (PhC)/ Clinical Pharmacy Specialist

## 2014-06-06 DIAGNOSIS — Z7901 Long term (current) use of anticoagulants: Secondary | ICD-10-CM | POA: Diagnosis not present

## 2014-06-06 DIAGNOSIS — Z5181 Encounter for therapeutic drug level monitoring: Secondary | ICD-10-CM | POA: Diagnosis not present

## 2014-06-06 DIAGNOSIS — I482 Chronic atrial fibrillation, unspecified: Secondary | ICD-10-CM

## 2014-06-06 DIAGNOSIS — I4891 Unspecified atrial fibrillation: Secondary | ICD-10-CM

## 2014-06-06 LAB — POCT INR: INR: 4.7

## 2014-06-08 ENCOUNTER — Other Ambulatory Visit: Payer: Self-pay | Admitting: Cardiology

## 2014-06-13 ENCOUNTER — Telehealth: Payer: Self-pay | Admitting: Cardiology

## 2014-06-13 ENCOUNTER — Other Ambulatory Visit: Payer: Self-pay | Admitting: Cardiology

## 2014-06-13 MED ORDER — POTASSIUM CHLORIDE CRYS ER 20 MEQ PO TBCR
20.0000 meq | EXTENDED_RELEASE_TABLET | Freq: Two times a day (BID) | ORAL | Status: DC
Start: 2014-06-13 — End: 2015-03-16

## 2014-06-13 NOTE — Telephone Encounter (Signed)
Mrs Logan White is calling about Mr Logan White Potassium Medication , because the pharmacy states that they have on file for him to take the medication once a day and he is supposed to take it twice a day ,the pharmacy is needing a new prescription that states he takes it twice a day .Marland KitchenPlease call   Thanks

## 2014-06-13 NOTE — Telephone Encounter (Signed)
Rx was sent to pharmacy electronically. 

## 2014-06-20 ENCOUNTER — Ambulatory Visit (INDEPENDENT_AMBULATORY_CARE_PROVIDER_SITE_OTHER): Payer: Medicare Other | Admitting: Pharmacist Clinician (PhC)/ Clinical Pharmacy Specialist

## 2014-06-20 DIAGNOSIS — Z7901 Long term (current) use of anticoagulants: Secondary | ICD-10-CM

## 2014-06-20 DIAGNOSIS — I4891 Unspecified atrial fibrillation: Secondary | ICD-10-CM

## 2014-06-20 DIAGNOSIS — I482 Chronic atrial fibrillation, unspecified: Secondary | ICD-10-CM

## 2014-06-20 DIAGNOSIS — Z5181 Encounter for therapeutic drug level monitoring: Secondary | ICD-10-CM | POA: Diagnosis not present

## 2014-06-20 LAB — POCT INR: INR: 3.7

## 2014-07-04 ENCOUNTER — Ambulatory Visit (INDEPENDENT_AMBULATORY_CARE_PROVIDER_SITE_OTHER): Payer: Medicare Other | Admitting: Pharmacist Clinician (PhC)/ Clinical Pharmacy Specialist

## 2014-07-04 DIAGNOSIS — Z7901 Long term (current) use of anticoagulants: Secondary | ICD-10-CM | POA: Diagnosis not present

## 2014-07-04 DIAGNOSIS — Z5181 Encounter for therapeutic drug level monitoring: Secondary | ICD-10-CM

## 2014-07-04 DIAGNOSIS — I4891 Unspecified atrial fibrillation: Secondary | ICD-10-CM

## 2014-07-04 DIAGNOSIS — I482 Chronic atrial fibrillation, unspecified: Secondary | ICD-10-CM

## 2014-07-04 LAB — POCT INR: INR: 2.9

## 2014-07-25 ENCOUNTER — Ambulatory Visit (INDEPENDENT_AMBULATORY_CARE_PROVIDER_SITE_OTHER): Payer: Medicare Other | Admitting: Pharmacist Clinician (PhC)/ Clinical Pharmacy Specialist

## 2014-07-25 DIAGNOSIS — I4891 Unspecified atrial fibrillation: Secondary | ICD-10-CM | POA: Diagnosis not present

## 2014-07-25 DIAGNOSIS — I482 Chronic atrial fibrillation, unspecified: Secondary | ICD-10-CM

## 2014-07-25 DIAGNOSIS — Z5181 Encounter for therapeutic drug level monitoring: Secondary | ICD-10-CM | POA: Diagnosis not present

## 2014-07-25 DIAGNOSIS — Z7901 Long term (current) use of anticoagulants: Secondary | ICD-10-CM | POA: Diagnosis not present

## 2014-07-25 LAB — POCT INR: INR: 3.3

## 2014-08-08 ENCOUNTER — Ambulatory Visit (INDEPENDENT_AMBULATORY_CARE_PROVIDER_SITE_OTHER): Payer: Medicare Other | Admitting: Pharmacist Clinician (PhC)/ Clinical Pharmacy Specialist

## 2014-08-08 DIAGNOSIS — I482 Chronic atrial fibrillation, unspecified: Secondary | ICD-10-CM

## 2014-08-08 DIAGNOSIS — Z5181 Encounter for therapeutic drug level monitoring: Secondary | ICD-10-CM

## 2014-08-08 DIAGNOSIS — I4891 Unspecified atrial fibrillation: Secondary | ICD-10-CM

## 2014-08-08 DIAGNOSIS — Z7901 Long term (current) use of anticoagulants: Secondary | ICD-10-CM | POA: Diagnosis not present

## 2014-08-08 LAB — POCT INR: INR: 1.4

## 2014-08-11 ENCOUNTER — Other Ambulatory Visit: Payer: Self-pay

## 2014-08-11 MED ORDER — WARFARIN SODIUM 2.5 MG PO TABS: 2.5000 mg | ORAL_TABLET | ORAL | Status: DC

## 2014-08-14 ENCOUNTER — Encounter (HOSPITAL_COMMUNITY): Payer: Self-pay | Admitting: Cardiology

## 2014-08-22 ENCOUNTER — Ambulatory Visit: Payer: Medicare Other | Admitting: Pharmacist Clinician (PhC)/ Clinical Pharmacy Specialist

## 2014-08-25 ENCOUNTER — Ambulatory Visit: Payer: Medicare Other | Admitting: Pharmacist Clinician (PhC)/ Clinical Pharmacy Specialist

## 2014-09-15 ENCOUNTER — Other Ambulatory Visit: Payer: Self-pay | Admitting: *Deleted

## 2014-09-15 MED ORDER — FUROSEMIDE 20 MG PO TABS: 20.0000 mg | ORAL_TABLET | Freq: Every day | ORAL | Status: DC

## 2014-10-23 NOTE — Progress Notes (Signed)
HPI: FU Atrial fibrillation, CAD, previous V fib arrest and ischemic CM. Patient with a history of PCI of his LAD in October 2004. He was admitted in June of 2013 following a ventricular fibrillation arrest that was felt related to hypokalemia (K 2.2). Patient improved following that episode. Patient admitted in April of 2014 following a ventricular fibrillation arrest. Cardiac catheterization in April of 2014 showed a patent stent in the LAD with a 40% in-stent restenosis. There was an apical 80% lesion. The circumflex was occluded. The right coronary artery was small and nondominant. The patient had attempt at PCI of the occluded circumflex but was unsuccessful. Echocardiogram in April of 2014 showed an ejection fraction of 30-35%, mild left atrial enlargement, moderately reduced RV function and mild mitral regurgitation. Patient was seen by Dr. Lovena Le of electrophysiology and ICD felt not indicated at that time because the patient did suffer a non-ST elevation myocardial infarction with positive enzymes. He was discharged with a life vest. Echocardiogram repeated in July of 2014. There was mild left ventricular hypertrophy and his ejection fraction was 55-60%. There was biatrial enlargement and mild mitral regurgitation. Last carotid Doppler performed in Sept 2015 showed 50-69% right stenosis. Followup was recommended in 12 months. Since he was last seen, the patient has dyspnea with more extreme activities but not with routine activities. It is relieved with rest. It is not associated with chest pain. There is no orthopnea, PND or pedal edema. There is no syncope or palpitations. There is no exertional chest pain.   Current Outpatient Prescriptions  Medication Sig Dispense Refill  . atorvastatin (LIPITOR) 80 MG tablet Take 1 tablet (80 mg total) by mouth daily at 6 PM. 30 tablet 10  . furosemide (LASIX) 20 MG tablet Take 1 tablet (20 mg total) by mouth daily. 30 tablet 1  . metoprolol succinate  (TOPROL-XL) 25 MG 24 hr tablet TAKE THREE TABLETS BY MOUTH DAILY  90 tablet 11  . nitroGLYCERIN (NITROSTAT) 0.4 MG SL tablet Place 1 tablet (0.4 mg total) under the tongue every 5 (five) minutes as needed for chest pain (Up to 3 doses). 25 tablet 3  . potassium chloride SA (K-DUR,KLOR-CON) 20 MEQ tablet Take 1 tablet (20 mEq total) by mouth 2 (two) times daily. 60 tablet 7  . warfarin (COUMADIN) 2.5 MG tablet Take 1 tablet (2.5 mg total) by mouth as directed. 30 tablet 3  . [DISCONTINUED] famotidine (PEPCID) 20 MG tablet Take 1 tablet (20 mg total) by mouth 2 (two) times daily. 60 tablet 11  . [DISCONTINUED] hydrALAZINE (APRESOLINE) 50 MG tablet Take 50 mg by mouth 3 (three) times daily.       No current facility-administered medications for this visit.     Past Medical History  Diagnosis Date  . Inguinal hernia without mention of obstruction or gangrene, unilateral or unspecified, (not specified as recurrent)   . 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 Peripheral vascular disease, unspecified   . Hyperlipidemia     mixed  . Hypertension     a. echo 4/12: EF 50%, asymmetric septal hypertrophy, no SAM or LVOT gradient, LAE, PASP 35  . Coronary artery disease     a. s/p aborted ant STEMI tx with Cypher DES to LAD 10/04 (residual at cath: D1 50%, CFX 40% and multiple dist 70%, EF 55%);   b. myoview 3/10: Ef 47%, infero-apical isch, LOW RISK - med Tx recommended  . CKD (chronic kidney disease)   .  Atrial fibrillation 12/2010  . Diverticulosis     2001  . Diabetes mellitus   . H/O: CVA (cardiovascular accident)   . Carotid artery stenosis     01/2011 - 40-59% bilateral stenosis  . Peripheral vascular disease     h/o LE angioplasty  . 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 Ischemic  cardiomyopathy     a. EF 50-55% by echo 03/09/12 (was 30% by echo 02/24/12)  . Mitral regurgitation     Mild by echo 03/2012  . Hematemesis     12/2010 felt 2/2 Mallory Weiss tear - pt could not afford colonscopy/EGD at that time so Coumadin was deferred. Coumadin initiated 03/2012 without any evidence for bleeding.  . Inguinal hernia   . Diverticulosis 2001  . QT prolongation   . Bradycardia     Requiring discontinuation of use of BB/CCB  . Elevated LFTs     Shock liver 03/2012    Past Surgical History  Procedure Laterality Date  . Popliteal artery angioplasty  01/07/07    s/p LEFT POPLITEAL ARTERY EXPLORATION AND VEIN PATCH ANGIOPLASTY, PER DR. EARLY, SECONDARY TO ISCHEMIC LEFT FOOT RELATED TO LEFT POPLITEAL ARTERY INJURY  . Orif tibia & fibula fractures  01/11/07    OPEN TX OF UNICONDYLAR PLATEAU FRACTURE, IRRIGATION/DEBRIDEMENT OF OPEN FRACTURE INCLUDING BONE, REMOVAL OF EXTERNAL FIXATOR UNDER ANESTHESIA PER DR. MICHAEL HANDY  . Hernia repair    . Skin graft    . Left heart cath N/A 12/30/2012    Procedure: LEFT HEART CATH;  Surgeon: [name], MD;  Location: [facility] CATH LAB;  Service: Cardiovascular;  Laterality: N/A;    History   Social History  . Marital Status: Married    Spouse Name: N/A  . Number of Children: 4  . Years of Education: N/A   Occupational History  . 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 Smoking status: Current Every Day Smoker -- 0.50 packs/day for 30 years    Types: Cigarettes    Last Attempt to Quit: 03/12/2012  . Smokeless tobacco: Not on file  . Alcohol Use: No  . Drug Use: No  . Sexual Activity: Not on file   Other Topics Concern  . 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 YES. 1/2 -1 PPD OF CIGARETTES    NO ETOH    ROS: no fevers or chills, productive cough, hemoptysis, dysphasia, odynophagia, melena,  hematochezia, dysuria, hematuria, rash, seizure activity, orthopnea, PND, pedal edema, claudication. Remaining systems are negative.  Physical Exam: Well-developed well-nourished in no acute distress.  Skin is warm and dry.  HEENT is normal.  Neck is supple.  Chest is clear to auscultation with normal expansion.  Cardiovascular exam is irregular  Abdominal exam nontender or distended. No masses palpated. Extremities show no edema. neuro grossly intact  ECG atrial fibrillation with PVCs or aberrantly conducted beats. Left ventricular hypertrophy with repolarization abnormality.

## 2014-10-24 ENCOUNTER — Ambulatory Visit (INDEPENDENT_AMBULATORY_CARE_PROVIDER_SITE_OTHER): Payer: Commercial Managed Care - HMO | Admitting: Cardiology

## 2014-10-24 ENCOUNTER — Encounter: Payer: Self-pay | Admitting: *Deleted

## 2014-10-24 ENCOUNTER — Ambulatory Visit (INDEPENDENT_AMBULATORY_CARE_PROVIDER_SITE_OTHER): Payer: Commercial Managed Care - HMO | Admitting: Pharmacist Clinician (PhC)/ Clinical Pharmacy Specialist

## 2014-10-24 ENCOUNTER — Encounter: Payer: Self-pay | Admitting: Cardiology

## 2014-10-24 DIAGNOSIS — Z7901 Long term (current) use of anticoagulants: Secondary | ICD-10-CM

## 2014-10-24 DIAGNOSIS — I482 Chronic atrial fibrillation, unspecified: Secondary | ICD-10-CM

## 2014-10-24 DIAGNOSIS — I2589 Other forms of chronic ischemic heart disease: Secondary | ICD-10-CM | POA: Diagnosis not present

## 2014-10-24 DIAGNOSIS — I10 Essential (primary) hypertension: Secondary | ICD-10-CM | POA: Diagnosis not present

## 2014-10-24 DIAGNOSIS — I4891 Unspecified atrial fibrillation: Secondary | ICD-10-CM | POA: Diagnosis not present

## 2014-10-24 DIAGNOSIS — I255 Ischemic cardiomyopathy: Secondary | ICD-10-CM | POA: Diagnosis not present

## 2014-10-24 DIAGNOSIS — I2510 Atherosclerotic heart disease of native coronary artery without angina pectoris: Secondary | ICD-10-CM

## 2014-10-24 DIAGNOSIS — Z5181 Encounter for therapeutic drug level monitoring: Secondary | ICD-10-CM

## 2014-10-24 DIAGNOSIS — I679 Cerebrovascular disease, unspecified: Secondary | ICD-10-CM

## 2014-10-24 DIAGNOSIS — R0989 Other specified symptoms and signs involving the circulatory and respiratory systems: Secondary | ICD-10-CM | POA: Insufficient documentation

## 2014-10-24 LAB — HEPATIC FUNCTION PANEL
ALK PHOS: 194 U/L — AB (ref 39–117)
ALT: 29 U/L (ref 0–53)
AST: 29 U/L (ref 0–37)
Albumin: 4.5 g/dL (ref 3.5–5.2)
Bilirubin, Direct: 0.2 mg/dL (ref 0.0–0.3)
Indirect Bilirubin: 0.6 mg/dL (ref 0.2–1.2)
TOTAL PROTEIN: 7.4 g/dL (ref 6.0–8.3)
Total Bilirubin: 0.8 mg/dL (ref 0.2–1.2)

## 2014-10-24 LAB — CBC
HCT: 50 % (ref 39.0–52.0)
HEMOGLOBIN: 16.9 g/dL (ref 13.0–17.0)
MCH: 26.4 pg (ref 26.0–34.0)
MCHC: 33.8 g/dL (ref 30.0–36.0)
MCV: 78 fL (ref 78.0–100.0)
MPV: 10.4 fL (ref 8.6–12.4)
Platelets: 190 10*3/uL (ref 150–400)
RBC: 6.41 MIL/uL — ABNORMAL HIGH (ref 4.22–5.81)
RDW: 16.9 % — ABNORMAL HIGH (ref 11.5–15.5)
WBC: 6.3 10*3/uL (ref 4.0–10.5)

## 2014-10-24 LAB — BASIC METABOLIC PANEL WITH GFR
BUN: 23 mg/dL (ref 6–23)
CHLORIDE: 108 meq/L (ref 96–112)
CO2: 23 mEq/L (ref 19–32)
Calcium: 9.4 mg/dL (ref 8.4–10.5)
Creat: 1.5 mg/dL — ABNORMAL HIGH (ref 0.50–1.35)
GFR, EST NON AFRICAN AMERICAN: 46 mL/min — AB
GFR, Est African American: 53 mL/min — ABNORMAL LOW
GLUCOSE: 114 mg/dL — AB (ref 70–99)
POTASSIUM: 4.3 meq/L (ref 3.5–5.3)
Sodium: 140 mEq/L (ref 135–145)

## 2014-10-24 LAB — POCT INR: INR: 4.6

## 2014-10-24 LAB — LIPID PANEL
CHOLESTEROL: 126 mg/dL (ref 0–200)
HDL: 33 mg/dL — ABNORMAL LOW (ref >39)
LDL CALC: 71 mg/dL (ref 0–99)
TRIGLYCERIDES: 109 mg/dL (ref <150)
Total CHOL/HDL Ratio: 3.8 Ratio
VLDL: 22 mg/dL (ref 0–40)

## 2014-10-24 MED ORDER — NICOTINE 14 MG/24HR TD PT24: 14.0000 mg | MEDICATED_PATCH | Freq: Every day | TRANSDERMAL | Status: DC

## 2014-10-24 NOTE — Assessment & Plan Note (Signed)
Continue present medications. Check potassium and renal function.

## 2014-10-24 NOTE — Assessment & Plan Note (Signed)
Continue statin. Check lipids and liver. 

## 2014-10-24 NOTE — Assessment & Plan Note (Signed)
Schedule for sound to exclude aneurysm.

## 2014-10-24 NOTE — Patient Instructions (Signed)
Your physician wants you to follow-up in: [place] will receive a reminder letter in the mail two months in advance. If you don't receive a letter, please call our office to schedule the follow-up appointment.   START [facility]  Your physician has requested that you have an abdominal aorta duplex. During this test, an ultrasound is used to evaluate the aorta. Allow 30 minutes for this exam. Do not eat after midnight the day before and avoid carbonated beverages   Your physician recommends that you HAVE LAB [place]

## 2014-10-24 NOTE — Assessment & Plan Note (Signed)
Continue statin. Not on aspirin given need for Coumadin. 

## 2014-10-24 NOTE — Assessment & Plan Note (Addendum)
Continue beta blocker and Coumadin. Check hemoglobin. I have provided the names of NOACs. He will check with his insurance company to see if they're covered. If so we will transition. He has not had his INR checked recently as he has been out of town. We will check that today.

## 2014-10-24 NOTE — Assessment & Plan Note (Signed)
Improved on most recent echocardiogram. Continue beta blocker.

## 2014-10-24 NOTE — Assessment & Plan Note (Signed)
Patient counseled on discontinuing. We have provided a nicotine patch today.

## 2014-10-24 NOTE — Assessment & Plan Note (Signed)
Follow-up carotid Dopplers September 2016.

## 2014-10-28 ENCOUNTER — Encounter: Payer: Self-pay | Admitting: *Deleted

## 2014-11-05 ENCOUNTER — Encounter: Payer: Self-pay | Admitting: *Deleted

## 2014-11-07 ENCOUNTER — Ambulatory Visit (INDEPENDENT_AMBULATORY_CARE_PROVIDER_SITE_OTHER): Payer: Commercial Managed Care - HMO | Admitting: Pharmacist Clinician (PhC)/ Clinical Pharmacy Specialist

## 2014-11-07 DIAGNOSIS — I4891 Unspecified atrial fibrillation: Secondary | ICD-10-CM | POA: Diagnosis not present

## 2014-11-07 DIAGNOSIS — Z5181 Encounter for therapeutic drug level monitoring: Secondary | ICD-10-CM | POA: Diagnosis not present

## 2014-11-07 DIAGNOSIS — I482 Chronic atrial fibrillation, unspecified: Secondary | ICD-10-CM

## 2014-11-07 DIAGNOSIS — Z7901 Long term (current) use of anticoagulants: Secondary | ICD-10-CM

## 2014-11-07 LAB — POCT INR: INR: 1.7

## 2014-11-28 ENCOUNTER — Ambulatory Visit: Payer: Commercial Managed Care - HMO | Admitting: Pharmacist Clinician (PhC)/ Clinical Pharmacy Specialist

## 2014-12-15 ENCOUNTER — Other Ambulatory Visit: Payer: Self-pay | Admitting: *Deleted

## 2014-12-15 MED ORDER — FUROSEMIDE 20 MG PO TABS: 20.0000 mg | ORAL_TABLET | Freq: Every day | ORAL | Status: DC

## 2015-01-16 DIAGNOSIS — H5210 Myopia, unspecified eye: Secondary | ICD-10-CM | POA: Diagnosis not present

## 2015-01-16 DIAGNOSIS — H5203 Hypermetropia, bilateral: Secondary | ICD-10-CM | POA: Diagnosis not present

## 2015-02-06 ENCOUNTER — Other Ambulatory Visit: Payer: Self-pay | Admitting: *Deleted

## 2015-02-06 MED ORDER — WARFARIN SODIUM 2.5 MG PO TABS: 2.5000 mg | ORAL_TABLET | ORAL | Status: DC

## 2015-02-12 ENCOUNTER — Other Ambulatory Visit: Payer: Self-pay | Admitting: Pharmacist Clinician (PhC)/ Clinical Pharmacy Specialist

## 2015-02-12 MED ORDER — WARFARIN SODIUM 2.5 MG PO TABS: 2.5000 mg | ORAL_TABLET | ORAL | Status: DC

## 2015-03-16 ENCOUNTER — Other Ambulatory Visit: Payer: Self-pay | Admitting: Cardiology

## 2015-03-16 ENCOUNTER — Other Ambulatory Visit: Payer: Self-pay

## 2015-03-16 MED ORDER — POTASSIUM CHLORIDE CRYS ER 20 MEQ PO TBCR: 20.0000 meq | EXTENDED_RELEASE_TABLET | Freq: Two times a day (BID) | ORAL | Status: DC

## 2015-03-17 ENCOUNTER — Other Ambulatory Visit: Payer: Self-pay | Admitting: Cardiology

## 2015-06-10 ENCOUNTER — Other Ambulatory Visit: Payer: Self-pay | Admitting: Cardiology

## 2015-06-19 ENCOUNTER — Other Ambulatory Visit: Payer: Self-pay | Admitting: Cardiology

## 2015-06-19 DIAGNOSIS — I6523 Occlusion and stenosis of bilateral carotid arteries: Secondary | ICD-10-CM

## 2015-06-25 ENCOUNTER — Ambulatory Visit (HOSPITAL_COMMUNITY)
Admission: RE | Admit: 2015-06-25 | Discharge: 2015-06-25 | Disposition: A | Discharge status: Home / Self Care | Payer: Commercial Managed Care - HMO | Source: Ambulatory Visit | Attending: Cardiology | Admitting: Cardiology

## 2015-06-25 DIAGNOSIS — E785 Hyperlipidemia, unspecified: Secondary | ICD-10-CM | POA: Insufficient documentation

## 2015-06-25 DIAGNOSIS — I129 Hypertensive chronic kidney disease with stage 1 through stage 4 chronic kidney disease, or unspecified chronic kidney disease: Secondary | ICD-10-CM | POA: Diagnosis not present

## 2015-06-25 DIAGNOSIS — I2510 Atherosclerotic heart disease of native coronary artery without angina pectoris: Secondary | ICD-10-CM | POA: Insufficient documentation

## 2015-06-25 DIAGNOSIS — N189 Chronic kidney disease, unspecified: Secondary | ICD-10-CM | POA: Insufficient documentation

## 2015-06-25 DIAGNOSIS — I779 Disorder of arteries and arterioles, unspecified: Secondary | ICD-10-CM | POA: Insufficient documentation

## 2015-06-25 DIAGNOSIS — E119 Type 2 diabetes mellitus without complications: Secondary | ICD-10-CM | POA: Diagnosis not present

## 2015-06-25 DIAGNOSIS — I6521 Occlusion and stenosis of right carotid artery: Secondary | ICD-10-CM | POA: Insufficient documentation

## 2015-06-25 DIAGNOSIS — I6523 Occlusion and stenosis of bilateral carotid arteries: Secondary | ICD-10-CM | POA: Diagnosis not present

## 2015-07-07 ENCOUNTER — Other Ambulatory Visit: Payer: Self-pay

## 2015-07-07 MED ORDER — METOPROLOL SUCCINATE ER 25 MG PO TB24: 75.0000 mg | ORAL_TABLET | Freq: Every day | ORAL | Status: DC

## 2015-07-08 ENCOUNTER — Other Ambulatory Visit: Payer: Self-pay | Admitting: Cardiology

## 2015-07-24 DIAGNOSIS — Z0000 Encounter for general adult medical examination without abnormal findings: Secondary | ICD-10-CM | POA: Diagnosis not present

## 2015-07-24 DIAGNOSIS — E782 Mixed hyperlipidemia: Secondary | ICD-10-CM | POA: Diagnosis not present

## 2015-07-24 DIAGNOSIS — I481 Persistent atrial fibrillation: Secondary | ICD-10-CM | POA: Diagnosis not present

## 2015-07-24 DIAGNOSIS — Z6820 Body mass index (BMI) 20.0-20.9, adult: Secondary | ICD-10-CM | POA: Diagnosis not present

## 2015-07-24 DIAGNOSIS — I739 Peripheral vascular disease, unspecified: Secondary | ICD-10-CM | POA: Diagnosis not present

## 2015-07-24 DIAGNOSIS — I10 Essential (primary) hypertension: Secondary | ICD-10-CM | POA: Diagnosis not present

## 2015-08-26 ENCOUNTER — Other Ambulatory Visit: Payer: Self-pay | Admitting: Cardiology

## 2015-08-26 DIAGNOSIS — I159 Secondary hypertension, unspecified: Secondary | ICD-10-CM

## 2015-08-26 DIAGNOSIS — N189 Chronic kidney disease, unspecified: Secondary | ICD-10-CM

## 2015-09-04 ENCOUNTER — Ambulatory Visit (HOSPITAL_COMMUNITY)
Admission: RE | Admit: 2015-09-04 | Discharge: 2015-09-04 | Disposition: A | Discharge status: Home / Self Care | Payer: Commercial Managed Care - HMO | Source: Ambulatory Visit | Attending: Cardiovascular Disease | Admitting: Cardiovascular Disease

## 2015-09-04 DIAGNOSIS — I129 Hypertensive chronic kidney disease with stage 1 through stage 4 chronic kidney disease, or unspecified chronic kidney disease: Secondary | ICD-10-CM | POA: Insufficient documentation

## 2015-09-04 DIAGNOSIS — N189 Chronic kidney disease, unspecified: Secondary | ICD-10-CM

## 2015-09-04 DIAGNOSIS — I700 Atherosclerosis of aorta: Secondary | ICD-10-CM | POA: Diagnosis not present

## 2015-09-04 DIAGNOSIS — I708 Atherosclerosis of other arteries: Secondary | ICD-10-CM | POA: Insufficient documentation

## 2015-09-04 DIAGNOSIS — R0989 Other specified symptoms and signs involving the circulatory and respiratory systems: Secondary | ICD-10-CM | POA: Diagnosis not present

## 2015-09-04 DIAGNOSIS — I159 Secondary hypertension, unspecified: Secondary | ICD-10-CM

## 2015-09-04 DIAGNOSIS — E119 Type 2 diabetes mellitus without complications: Secondary | ICD-10-CM | POA: Insufficient documentation

## 2015-09-04 DIAGNOSIS — E785 Hyperlipidemia, unspecified: Secondary | ICD-10-CM | POA: Diagnosis not present

## 2015-09-08 ENCOUNTER — Encounter: Payer: Self-pay | Admitting: Nurse Practitioner

## 2015-09-08 ENCOUNTER — Ambulatory Visit (INDEPENDENT_AMBULATORY_CARE_PROVIDER_SITE_OTHER): Payer: Commercial Managed Care - HMO | Admitting: Nurse Practitioner

## 2015-09-08 VITALS — BP 132/78 | HR 81 | Ht 75.5 in | Wt 183.9 lb

## 2015-09-08 DIAGNOSIS — I6529 Occlusion and stenosis of unspecified carotid artery: Secondary | ICD-10-CM | POA: Diagnosis not present

## 2015-09-08 DIAGNOSIS — I739 Peripheral vascular disease, unspecified: Secondary | ICD-10-CM | POA: Diagnosis not present

## 2015-09-08 DIAGNOSIS — I4891 Unspecified atrial fibrillation: Secondary | ICD-10-CM | POA: Diagnosis not present

## 2015-09-08 DIAGNOSIS — I6523 Occlusion and stenosis of bilateral carotid arteries: Secondary | ICD-10-CM | POA: Diagnosis not present

## 2015-09-08 DIAGNOSIS — I2510 Atherosclerotic heart disease of native coronary artery without angina pectoris: Secondary | ICD-10-CM | POA: Diagnosis not present

## 2015-09-08 DIAGNOSIS — R0609 Other forms of dyspnea: Secondary | ICD-10-CM

## 2015-09-08 DIAGNOSIS — I714 Abdominal aortic aneurysm, without rupture, unspecified: Secondary | ICD-10-CM | POA: Insufficient documentation

## 2015-09-08 DIAGNOSIS — I482 Chronic atrial fibrillation: Secondary | ICD-10-CM

## 2015-09-08 DIAGNOSIS — I4821 Permanent atrial fibrillation: Secondary | ICD-10-CM

## 2015-09-08 DIAGNOSIS — I119 Hypertensive heart disease without heart failure: Secondary | ICD-10-CM | POA: Diagnosis not present

## 2015-09-08 LAB — CBC WITH DIFFERENTIAL/PLATELET
BASOS PCT: 0 % (ref 0–1)
Basophils Absolute: 0 10*3/uL (ref 0.0–0.1)
EOS ABS: 0.2 10*3/uL (ref 0.0–0.7)
EOS PCT: 3 % (ref 0–5)
HCT: 38.9 % — ABNORMAL LOW (ref 39.0–52.0)
Hemoglobin: 12.9 g/dL — ABNORMAL LOW (ref 13.0–17.0)
Lymphocytes Relative: 25 % (ref 12–46)
Lymphs Abs: 1.7 10*3/uL (ref 0.7–4.0)
MCH: 26.2 pg (ref 26.0–34.0)
MCHC: 33.2 g/dL (ref 30.0–36.0)
MCV: 79.1 fL (ref 78.0–100.0)
MONO ABS: 0.7 10*3/uL (ref 0.1–1.0)
MONOS PCT: 10 % (ref 3–12)
MPV: 10.8 fL (ref 8.6–12.4)
NEUTROS PCT: 62 % (ref 43–77)
Neutro Abs: 4.2 10*3/uL (ref 1.7–7.7)
PLATELETS: 281 10*3/uL (ref 150–400)
RBC: 4.92 MIL/uL (ref 4.22–5.81)
RDW: 17.2 % — ABNORMAL HIGH (ref 11.5–15.5)
WBC: 6.7 10*3/uL (ref 4.0–10.5)

## 2015-09-08 LAB — COMPREHENSIVE METABOLIC PANEL
ALT: 19 U/L (ref 9–46)
AST: 17 U/L (ref 10–35)
Albumin: 3.7 g/dL (ref 3.6–5.1)
Alkaline Phosphatase: 135 U/L — ABNORMAL HIGH (ref 40–115)
BUN: 13 mg/dL (ref 7–25)
CHLORIDE: 106 mmol/L (ref 98–110)
CO2: 25 mmol/L (ref 20–31)
CREATININE: 1.15 mg/dL (ref 0.70–1.18)
Calcium: 8.7 mg/dL (ref 8.6–10.3)
Glucose, Bld: 93 mg/dL (ref 65–99)
POTASSIUM: 3.9 mmol/L (ref 3.5–5.3)
Sodium: 143 mmol/L (ref 135–146)
Total Bilirubin: 2.1 mg/dL — ABNORMAL HIGH (ref 0.2–1.2)
Total Protein: 6 g/dL — ABNORMAL LOW (ref 6.1–8.1)

## 2015-09-08 NOTE — Addendum Note (Signed)
Addended by: Murray Hodgkins R on: 09/08/2015 12:57 PM   Modules accepted: Orders

## 2015-09-08 NOTE — Patient Instructions (Addendum)
Medication Instructions:  Your physician recommends that you continue on your current medications as directed. Please refer to the Current Medication list given to you today.   Labwork: TODAY:  CBC W/ DIFF                 CMET                 PT/INR                 LIPID   Testing/Procedures: A chest x-ray takes a picture of the organs and structures inside the chest, including the heart, lungs, and blood vessels. This test can show several things, including, whether the heart is enlarges; whether fluid is building up in the lungs; and whether pacemaker / defibrillator leads are still in place.  Your physician has requested that you have a lexiscan myoview. For further information please visit [email or web address]. Please follow instruction sheet, as given.   Your physician has requested that you have a lower extremity arterial exercise duplex. During this test, exercise and ultrasound are used to evaluate arterial blood flow in the legs. Allow one hour for this exam. There are no restrictions or special instructions.  Your physician has requested that you have an echocardiogram. Echocardiography is a painless test that uses sound waves to create images of your heart. It provides your doctor with information about the size and shape of your heart and how well your heart's chambers and valves are working. This procedure takes approximately one hour. There are no restrictions for this procedure.    Follow-Up: Your physician recommends that you schedule a follow-up appointment in:   ASAP in the [facility] 2-3 [place] EXTENDER    Any Other Special Instructions Will Be Listed Below (If Applicable).  Echocardiogram An echocardiogram, or echocardiography, uses sound waves (ultrasound) to produce an image of your heart. The echocardiogram is simple, painless, obtained within a short period of time, and offers valuable information to  your health care provider. The images from an echocardiogram can provide information such as:  Evidence of coronary artery disease (CAD).  Heart size.  Heart muscle function.  Heart valve function.  Aneurysm detection.  Evidence of a past heart attack.  Fluid buildup around the heart.  Heart muscle thickening.  Assess heart valve function. LET [facility] CARE PROVIDER KNOW ABOUT:  Any allergies you have.  All medicines you are taking, including vitamins, herbs, eye drops, creams, and over-the-counter medicines.  Previous problems you or members of your family have had with the use of anesthetics.  Any blood disorders you have.  Previous surgeries you have had.  Medical conditions you have.  Possibility of pregnancy, if this applies. BEFORE THE PROCEDURE  No special preparation is needed. Eat and drink normally.  PROCEDURE   In order to produce an image of your heart, gel will be applied to your chest and a wand-like tool (transducer) will be moved over your chest. The gel will help transmit the sound waves from the transducer. The sound waves will harmlessly bounce off your heart to allow the heart images to be captured in real-time motion. These images will then be recorded.  You may need an IV to receive a medicine that improves the quality of the pictures. AFTER THE PROCEDURE You may return to your normal schedule including diet, activities, and medicines, unless your health care provider tells you otherwise.   This information is not  intended to replace advice given to you by your health care provider. Make sure you discuss any questions you have with your health care provider.   Document Released: 08/19/2000 Document Revised: 09/12/2014 Document Reviewed: 04/29/2013 Elsevier Interactive Patient Education 2016 [place].  Pharmacologic Stress Electrocardiogram A pharmacologic stress electrocardiogram is a heart (cardiac) test that uses nuclear imaging to  evaluate the blood supply to your heart. This test may also be called a pharmacologic stress electrocardiography. Pharmacologic means that a medicine is used to increase your heart rate and blood pressure.  This stress test is done to find areas of poor blood flow to the heart by determining the extent of coronary artery disease (CAD). Some people exercise on a treadmill, which naturally increases the blood flow to the heart. For those people unable to exercise on a treadmill, a medicine is used. This medicine stimulates your heart and will cause your heart to beat harder and more quickly, as if you were exercising.  Pharmacologic stress tests can help determine:  The adequacy of blood flow to your heart during increased levels of activity in order to clear you for discharge home.  The extent of coronary artery blockage caused by CAD.  Your prognosis if you have suffered a heart attack.  The effectiveness of cardiac procedures done, such as an angioplasty, which can increase the circulation in your coronary arteries.  Causes of chest pain or pressure. LET [facility] CARE PROVIDER KNOW ABOUT:  Any allergies you have.  All medicines you are taking, including vitamins, herbs, eye drops, creams, and over-the-counter medicines.  Previous problems you or members of your family have had with the use of anesthetics.  Any blood disorders you have.  Previous surgeries you have had.  Medical conditions you have.  Possibility of pregnancy, if this applies.  If you are currently breastfeeding. RISKS AND COMPLICATIONS Generally, this is a safe procedure. However, as with any procedure, complications can occur. Possible complications include:  You develop pain or pressure in the following areas:  Chest.  Jaw or neck.  Between your shoulder blades.  Radiating down your left arm.  Headache.  Dizziness or light-headedness.  Shortness of breath.  Increased or irregular  heartbeat.  Low blood pressure.  Nausea or vomiting.  Flushing.  Redness going up the arm and slight pain during injection of medicine.  Heart attack (rare). BEFORE THE PROCEDURE   Avoid all forms of caffeine for 24 hours before your test or as directed by your health care provider. This includes coffee, tea (even decaffeinated tea), caffeinated sodas, chocolate, cocoa, and certain pain medicines.  Follow your health care provider's instructions regarding eating and drinking before the test.  Take your medicines as directed at regular times with water unless instructed otherwise. Exceptions may include:  If you have diabetes, ask how you are to take your insulin or pills. It is common to adjust insulin dosing the morning of the test.  If you are taking beta-blocker medicines, it is important to talk to your health care provider about these medicines well before the date of your test. Taking beta-blocker medicines may interfere with the test. In some cases, these medicines need to be changed or stopped 24 hours or more before the test.  If you wear a nitroglycerin patch, it may need to be removed prior to the test. Ask your health care provider if the patch should be removed before the test.  If you use an inhaler for any breathing condition, bring it  with you to the test.  If you are an outpatient, bring a snack so you can eat right after the stress phase of the test.  Do not smoke for 4 hours prior to the test or as directed by your health care provider.  Do not apply lotions, powders, creams, or oils on your chest prior to the test.  Wear comfortable shoes and clothing. Let your health care provider know if you were unable to complete or follow the preparations for your test. PROCEDURE   Multiple patches (electrodes) will be put on your chest. If needed, small areas of your chest may be shaved to get better contact with the electrodes. Once the electrodes are attached to your  body, multiple wires will be attached to the electrodes, and your heart rate will be monitored.  An IV access will be started. A nuclear trace (isotope) is given. The isotope may be given intravenously, or it may be swallowed. Nuclear refers to several types of radioactive isotopes, and the nuclear isotope lights up the arteries so that the nuclear images are clear. The isotope is absorbed by your body. This results in low radiation exposure.  A resting nuclear image is taken to show how your heart functions at rest.  A medicine is given through the IV access.  A second scan is done about 1 hour after the medicine injection and determines how your heart functions under stress.  During this stress phase, you will be connected to an electrocardiogram machine. Your blood pressure and oxygen levels will be monitored. AFTER THE PROCEDURE   Your heart rate and blood pressure will be monitored after the test.  You may return to your normal schedule, including diet,activities, and medicines, unless your health care provider tells you otherwise.   This information is not intended to replace advice given to you by your health care provider. Make sure you discuss any questions you have with your health care provider.   Document Released: 01/08/2009 Document Revised: 08/27/2013 Document Reviewed: 04/29/2013 Elsevier Interactive Patient Education [employer].    If you need a refill on your cardiac medications before your next appointment, please call your pharmacy.

## 2015-09-08 NOTE — Progress Notes (Signed)
Office Visit    Patient Name: Logan White Date of Encounter: 09/08/2015  Primary Care Provider:  No PCP Per Patient Primary Cardiologist:  B. [name], MD   Chief Complaint    75 y/o male with a h/o CAD, permanent afib, PAD, and AAA, who presents for f/u today r/t dyspnea on exertion.  Past Medical History    Past Medical History  Diagnosis Date  . Inguinal hernia without mention of obstruction or gangrene, unilateral or unspecified, (not specified as recurrent)   . 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 Mixed Hyperlipidemia   . Hypertensive heart disease     a. echo 4/12: EF 50%, asymmetric septal hypertrophy, no SAM or LVOT gradient, LAE, PASP 35  . Coronary artery disease     a. s/p aborted ant STEMI tx with Cypher DES to LAD 10/04 (residual at cath: D1 50%, CFX 40% and multiple dist 70%, EF 55%);   b. myoview 3/10: Ef 47%, infero-apical isch, LOW RISK - med Tx recommended;  c. 12/2012 VF Arrest/Cath: LAD 40isr, 80 apical, LCX 100 (failed PTCA), RCA nondom.  . CKD (chronic kidney disease), stage III   . Persistent atrial fibrillation ([place])     a. Dx 12/2010-->coumadin (CHA2DS2VASc = 7).  . Diverticulosis     2001  . Diabetes mellitus   . H/O: CVA (cardiovascular accident)   . Carotid artery stenosis     a. 01/2011 - 40-59% bilateral stenosis;  b. 06/2015 Carotid U/S: 40-59% bilat ICA stenosis->f/u 6 mos.  . Peripheral vascular disease ([place])     a. h/o LE angioplasty;  b. 08/2015 Duplex: >50% RCIA, 100% REIA, >50% LEIA, elev vel in SMA, patent IVC.  Marland Kitchen Cardiac arrest - ventricular fibrillation     a. 03/2012 in setting of hypokalemia (prolonged hosp with VDRF, tracheobronchitis, ARF, shock liver, PAF, AMS felt secondary to post-anoxic encephalopathy/shock).  Marland Kitchen History of Ischemic Cardiomyopathy     a. EF 50-55% by echo 03/09/12 (was 30% by echo 02/24/12); b. 03/2013 Echo: EF 55-60%, mild MR, sev dil LA, mod dil RA, PASP 64mmHg.  . Mitral  regurgitation     a. Mild by echo 03/2012 and 03/2013.  Marland Kitchen Hematemesis     a. 12/2010 felt 2/2 Mallory Weiss tear - pt could not afford colonscopy/EGD at that time so Coumadin was deferred. Coumadin initiated 03/2012 without any evidence for bleeding.  . Inguinal hernia   . Diverticulosis 2001  . QT prolongation   . Bradycardia     a. Requiring discontinuation of use of BB/CCB  . Elevated LFTs     Shock liver 03/2012  . AAA (abdominal aortic aneurysm) ([place])     a. 08/2015 Abd U/S: 2.7 x 2.9 x 3.2 cm infrarenal AAA.   Past Surgical History  Procedure Laterality Date  . Popliteal artery angioplasty  01/07/07    s/p LEFT POPLITEAL ARTERY EXPLORATION AND VEIN PATCH ANGIOPLASTY, PER DR. EARLY, SECONDARY TO ISCHEMIC LEFT FOOT RELATED TO LEFT POPLITEAL ARTERY INJURY  . Orif tibia & fibula fractures  01/11/07    OPEN TX OF UNICONDYLAR PLATEAU FRACTURE, IRRIGATION/DEBRIDEMENT OF OPEN FRACTURE INCLUDING BONE, REMOVAL OF EXTERNAL FIXATOR UNDER ANESTHESIA PER DR. MICHAEL HANDY  . Hernia repair    . Skin graft    . Left heart cath N/A 12/30/2012    Procedure: LEFT HEART CATH;  Surgeon: [name], MD;  Location: [facility] CATH LAB;  Service: Cardiovascular;  Laterality: N/A;    Allergies  No  Known Allergies  History of Present Illness    75 y/o male with the above complex PMH.  He has a h/o PCI of the LAD in 2004 with VF arrest in 02/2012 in the setting of hypokalemia and recurrent VF arrest in 12/2012, that time in the setting of LCX occlusion (attempted PCI medical therapy).  He also has a h/o ICM with EF as low as 30-35% at one point, though most recent EF was 55-60% by echo in 03/2013.  He has permanent AF and is on chronic coumadin.  He was having his INRs followed here but hasn't had an INR since last March.  He says that he's been taking his coumadin but didn't realized that he needed to continue to have monitoring.   He was last seen in clinic in February 2016. He was doing well at that time. Says that  over the past couple of months, his appetite has dropped significantly. His weight, which had been in the 192-200 range is now down to 183 without trying to lose weight. Over the past 2 weeks or so, he has also noted dyspnea on exertion. He works a night shift cleaning job and notes that he's had to stop and rest more frequently within usual. He has not been having any chest discomfort. He denies PND, orthopnea, dizziness, syncope, edema, or early satiety.  He has some degree of chronic bilateral thigh fatigue/claudication with ambulation during work. He recently had abdominal ultrasound for AAA and this revealed bilateral iliac disease. Recommendation was made for peripheral vascular evaluation. He denies change in hair distribution, color, or sensation.  Home Medications    Prior to Admission medications   Medication Sig Start Date End Date Taking? Authorizing Provider  atorvastatin (LIPITOR) 80 MG tablet Take 1 tablet (80 mg total) by mouth daily at 6 PM. Please schedule an appointment 06/10/15   Lelon Perla, MD  furosemide (LASIX) 20 MG tablet Take 1 tablet (20 mg total) by mouth daily. 12/15/14   Lelon Perla, MD  metoprolol succinate (TOPROL-XL) 25 MG 24 hr tablet Take 3 tablets (75 mg total) by mouth daily. 07/07/15   Lelon Perla, MD  nicotine (NICODERM CQ) 14 mg/24hr patch Place 1 patch (14 mg total) onto the skin daily. 10/24/14   Lelon Perla, MD  nitroGLYCERIN (NITROSTAT) 0.4 MG SL tablet Place 1 tablet (0.4 mg total) under the tongue every 5 (five) minutes as needed for chest pain (Up to 3 doses). 01/11/13   Rogelia Mire, NP  potassium chloride SA (K-DUR,KLOR-CON) 20 MEQ tablet TAKE ONE TABLET BY MOUTH TWICE DAILY 07/08/15   Lelon Perla, MD  warfarin (COUMADIN) 2.5 MG tablet Take 1 tablet (2.5 mg total) by mouth as directed. 02/12/15   Lelon Perla, MD    Review of Systems    As above, he has been expressing early satiety for several months with associated  unintentional weight loss. He has been expressing dyspnea on exertion over the past few weeks.  He denies chest pain, palpitations, pnd, orthopnea, n, v, dizziness, syncope, edema, weight gain, early satiety, cough, fevers, chills.   All other systems reviewed and are otherwise negative except as noted above.  Physical Exam    VS:  BP 132/78 mmHg  Pulse 81  Ht 6' 3.5" (1.918 m)  Wt 183 lb 14.4 oz (83.416 kg)  BMI 22.68 kg/m2 , BMI Body mass index is 22.68 kg/(m^2). GEN: Well nourished, well developed, in no acute distress. HEENT: normal.  Neck: Supple, no JVD, carotid bruits, or masses. Cardiac: IR, IR, no murmurs, rubs, or gallops. No clubbing, cyanosis, edema.  Radials/DP/PT 1+ and equal bilaterally.  Respiratory:  Respirations regular and unlabored, initially rhonchi noted - cleared with additional deep breathing. GI: Soft, nontender, nondistended, BS + x 4. MS: no deformity or atrophy. Skin: warm and dry, no rash. Neuro:  Strength and sensation are intact. Psych: Normal affect.  Accessory Clinical Findings    ECG - AFib, 81, mild inferolat ST dep with lat TWI/biphasic T's - similar to prior ECGs.  Assessment & Plan    1.  Dyspnea on Exertion:  Pt has been experiencing dyspnea on exertion over the past 2 weeks. This is a new symptom for him. 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 He has not been having any chest pain. He does not remember what his anginal equivalent was. He has no evidence of volume overload on exam. He also reports early satiety with unintentional weight loss. I will check a chest x-ray and also arrange for 2-D echocardiogram given his prior history of ischemic cardi68myopathy. As it has been since his last ischemic evaluation, I will also arrange for a Lexiscan Myoview. As he has been on Coumadin without an INR check in nearly a year, I will check a CBC, INR, and complete metabolic panel today.  2.  CAD: s/p prior LAD stenting in 2004, with  occluded LCX noted @ time of VF arrest in 12/2012 (LAD w/40% ISR @ that time). As above, he has not been having any chest pain but has been having dyspnea exertion. Plan for [facility]. Continue beta blocker and statin therapy. He has non-aspirin as he is on warfarin.  3.  Permanent Afib:  Rate controlled and asymptomatic. He is on chronic Coumadin however has not had an INR since March 2016. I will check an INR today and rearrange for Coumadin clinic follow-up. We did discuss the importance of regular Coumadin follow-up.  4.  Hypertensive Heart Disease: Blood pressure stable today on beta blocker therapy.  5.  HL: LDL 71 one year ago. Follow-up lipids and LFTs today.  6.  H/O ICM:  Last echo 03/2013 with nl LV fxn. He has been expressing dyspnea on exertion without evidence of volume overload. His also had early satiety. Repeat echocardiogram as above. Continue beta blocker.  7.  Tobacco Abuse: He continues to smoke less than half a pack a day. Complete cessation advised.  8. Peripheral arterial disease/claudication: Patient says that he has bilateral thigh fatigue when he works. He is not sure that this is changed recently. Recent abdominal ultrasound suggested bilateral iliac disease. I will obtain ABIs to further clarify and refer to Dr. Gwenlyn Found pending results.   9. Bilateral carotid arterial disease: Stable by most recent ultrasound. Continue statin therapy.  10.  Disposition: Labs and testing as above. Plan to follow-up 2-3 weeks once testing complete.  Murray Hodgkins, NP 09/08/2015, 11:25 AM

## 2015-09-09 ENCOUNTER — Encounter (HOSPITAL_COMMUNITY): Payer: Self-pay | Admitting: Cardiology

## 2015-09-09 ENCOUNTER — Other Ambulatory Visit: Payer: Self-pay | Admitting: Nurse Practitioner

## 2015-09-09 ENCOUNTER — Encounter: Payer: Commercial Managed Care - HMO | Admitting: Pharmacist Clinician (PhC)/ Clinical Pharmacy Specialist

## 2015-09-09 DIAGNOSIS — I739 Peripheral vascular disease, unspecified: Secondary | ICD-10-CM

## 2015-09-09 LAB — PROTIME-INR
INR: 1.34 (ref <1.50)
Prothrombin Time: 16.7 seconds — ABNORMAL HIGH (ref 11.6–15.2)

## 2015-09-10 ENCOUNTER — Telehealth (HOSPITAL_COMMUNITY): Payer: Self-pay

## 2015-09-10 NOTE — Telephone Encounter (Signed)
Unable to reach pt

## 2015-09-11 ENCOUNTER — Ambulatory Visit (INDEPENDENT_AMBULATORY_CARE_PROVIDER_SITE_OTHER): Payer: Commercial Managed Care - HMO | Admitting: Pharmacist Clinician (PhC)/ Clinical Pharmacy Specialist

## 2015-09-11 DIAGNOSIS — Z7901 Long term (current) use of anticoagulants: Secondary | ICD-10-CM | POA: Diagnosis not present

## 2015-09-11 DIAGNOSIS — I482 Chronic atrial fibrillation, unspecified: Secondary | ICD-10-CM

## 2015-09-11 DIAGNOSIS — I4891 Unspecified atrial fibrillation: Secondary | ICD-10-CM | POA: Diagnosis not present

## 2015-09-11 DIAGNOSIS — Z5181 Encounter for therapeutic drug level monitoring: Secondary | ICD-10-CM | POA: Diagnosis not present

## 2015-09-11 LAB — POCT INR: INR: 1.6

## 2015-09-15 ENCOUNTER — Inpatient Hospital Stay (HOSPITAL_COMMUNITY): Admission: RE | Admit: 2015-09-15 | Payer: Commercial Managed Care - HMO | Source: Ambulatory Visit

## 2015-09-21 ENCOUNTER — Other Ambulatory Visit: Payer: Self-pay

## 2015-09-21 ENCOUNTER — Ambulatory Visit (HOSPITAL_COMMUNITY): Payer: Commercial Managed Care - HMO | Attending: Cardiovascular Disease

## 2015-09-21 DIAGNOSIS — E785 Hyperlipidemia, unspecified: Secondary | ICD-10-CM | POA: Insufficient documentation

## 2015-09-21 DIAGNOSIS — F17200 Nicotine dependence, unspecified, uncomplicated: Secondary | ICD-10-CM | POA: Diagnosis not present

## 2015-09-21 DIAGNOSIS — R0600 Dyspnea, unspecified: Secondary | ICD-10-CM | POA: Diagnosis not present

## 2015-09-21 DIAGNOSIS — I739 Peripheral vascular disease, unspecified: Secondary | ICD-10-CM | POA: Diagnosis not present

## 2015-09-21 DIAGNOSIS — E119 Type 2 diabetes mellitus without complications: Secondary | ICD-10-CM | POA: Insufficient documentation

## 2015-09-21 DIAGNOSIS — I129 Hypertensive chronic kidney disease with stage 1 through stage 4 chronic kidney disease, or unspecified chronic kidney disease: Secondary | ICD-10-CM | POA: Insufficient documentation

## 2015-09-21 DIAGNOSIS — I351 Nonrheumatic aortic (valve) insufficiency: Secondary | ICD-10-CM | POA: Diagnosis not present

## 2015-09-21 DIAGNOSIS — N189 Chronic kidney disease, unspecified: Secondary | ICD-10-CM | POA: Diagnosis not present

## 2015-09-21 DIAGNOSIS — R0609 Other forms of dyspnea: Secondary | ICD-10-CM | POA: Insufficient documentation

## 2015-09-21 DIAGNOSIS — I482 Chronic atrial fibrillation: Secondary | ICD-10-CM | POA: Diagnosis not present

## 2015-09-21 DIAGNOSIS — I340 Nonrheumatic mitral (valve) insufficiency: Secondary | ICD-10-CM | POA: Diagnosis not present

## 2015-09-21 DIAGNOSIS — R29898 Other symptoms and signs involving the musculoskeletal system: Secondary | ICD-10-CM | POA: Diagnosis not present

## 2015-09-21 DIAGNOSIS — Z8249 Family history of ischemic heart disease and other diseases of the circulatory system: Secondary | ICD-10-CM | POA: Insufficient documentation

## 2015-09-21 DIAGNOSIS — I4821 Permanent atrial fibrillation: Secondary | ICD-10-CM

## 2015-09-22 ENCOUNTER — Telehealth: Payer: Self-pay | Admitting: *Deleted

## 2015-09-22 NOTE — Telephone Encounter (Signed)
-----   Message from Rogelia Mire, NP sent at 09/21/2015 12:37 PM EST ----- EF back down to 35-40%.  He has had LV dysfxn before but LV fxn normalized by echo in 2014.  Symptoms likely related to drop in EF.  He has f/u with Tarri Fuller on 1/19 and he needs to keep that appt as we may need to consider right and left heart cath (he was prev scheduled for a cardiolite but did not show).

## 2015-09-22 NOTE — Telephone Encounter (Signed)
Per Ignacia Bayley, NP, called pt re: ECHO results.  Pt has been informed of his ECHO results and the importance that it is for him to keep his f/u appt 09/24/15 to discuss possible l/r heart cath due to his EF% going down.  Pt verbalized understanding.

## 2015-09-23 ENCOUNTER — Other Ambulatory Visit: Payer: Self-pay | Admitting: Nurse Practitioner

## 2015-09-23 DIAGNOSIS — I739 Peripheral vascular disease, unspecified: Secondary | ICD-10-CM

## 2015-09-24 ENCOUNTER — Encounter: Payer: Self-pay | Admitting: Physician Assistant

## 2015-09-24 ENCOUNTER — Ambulatory Visit (INDEPENDENT_AMBULATORY_CARE_PROVIDER_SITE_OTHER): Payer: Commercial Managed Care - HMO | Admitting: Physician Assistant

## 2015-09-24 ENCOUNTER — Ambulatory Visit (INDEPENDENT_AMBULATORY_CARE_PROVIDER_SITE_OTHER): Payer: Commercial Managed Care - HMO | Admitting: Pharmacist Clinician (PhC)/ Clinical Pharmacy Specialist

## 2015-09-24 VITALS — BP 132/70 | HR 60 | Ht 75.5 in | Wt 183.5 lb

## 2015-09-24 DIAGNOSIS — R7989 Other specified abnormal findings of blood chemistry: Secondary | ICD-10-CM

## 2015-09-24 DIAGNOSIS — Z7901 Long term (current) use of anticoagulants: Secondary | ICD-10-CM

## 2015-09-24 DIAGNOSIS — R0600 Dyspnea, unspecified: Secondary | ICD-10-CM | POA: Diagnosis not present

## 2015-09-24 DIAGNOSIS — I482 Chronic atrial fibrillation, unspecified: Secondary | ICD-10-CM

## 2015-09-24 DIAGNOSIS — I739 Peripheral vascular disease, unspecified: Secondary | ICD-10-CM

## 2015-09-24 DIAGNOSIS — I2583 Coronary atherosclerosis due to lipid rich plaque: Secondary | ICD-10-CM

## 2015-09-24 DIAGNOSIS — N183 Chronic kidney disease, stage 3 unspecified: Secondary | ICD-10-CM

## 2015-09-24 DIAGNOSIS — R945 Abnormal results of liver function studies: Secondary | ICD-10-CM

## 2015-09-24 DIAGNOSIS — Z5181 Encounter for therapeutic drug level monitoring: Secondary | ICD-10-CM

## 2015-09-24 DIAGNOSIS — I4891 Unspecified atrial fibrillation: Secondary | ICD-10-CM

## 2015-09-24 DIAGNOSIS — I6523 Occlusion and stenosis of bilateral carotid arteries: Secondary | ICD-10-CM

## 2015-09-24 DIAGNOSIS — I255 Ischemic cardiomyopathy: Secondary | ICD-10-CM

## 2015-09-24 DIAGNOSIS — I2510 Atherosclerotic heart disease of native coronary artery without angina pectoris: Secondary | ICD-10-CM

## 2015-09-24 DIAGNOSIS — R931 Abnormal findings on diagnostic imaging of heart and coronary circulation: Secondary | ICD-10-CM

## 2015-09-24 DIAGNOSIS — E785 Hyperlipidemia, unspecified: Secondary | ICD-10-CM

## 2015-09-24 DIAGNOSIS — I10 Essential (primary) hypertension: Secondary | ICD-10-CM

## 2015-09-24 DIAGNOSIS — F17200 Nicotine dependence, unspecified, uncomplicated: Secondary | ICD-10-CM

## 2015-09-24 LAB — POCT INR: INR: 1.9

## 2015-09-24 NOTE — Patient Instructions (Signed)
Your physician has requested that you have a lexiscan myoview. For further information please visit [email or web address]. Please follow instruction sheet, as given.  Your physician recommends that you return for fasting lab work in: 2 weeks.  Your physician recommends that you schedule a follow-up appointment in: 3 weeks with Jolly Mango, or Dr [name].  Go have chest xray ordered on 09/08/15. No appointment needed.  [facility] [employer]
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
## 2015-09-25 ENCOUNTER — Telehealth: Payer: Self-pay | Admitting: *Deleted

## 2015-09-25 ENCOUNTER — Other Ambulatory Visit: Payer: Self-pay | Admitting: *Deleted

## 2015-09-25 MED ORDER — HYDRALAZINE HCL 10 MG PO TABS: 10.0000 mg | ORAL_TABLET | Freq: Three times a day (TID) | ORAL | Status: DC

## 2015-09-25 NOTE — Telephone Encounter (Signed)
Called and notified patient that a  Prescription for hydralazine has been ordered and sent to his pharmacy. Patient voiced understanding.

## 2015-09-29 ENCOUNTER — Other Ambulatory Visit: Payer: Self-pay | Admitting: Cardiology

## 2015-09-29 NOTE — Telephone Encounter (Signed)
Rx(s) sent to pharmacy electronically.  

## 2015-10-01 ENCOUNTER — Telehealth (HOSPITAL_COMMUNITY): Payer: Self-pay

## 2015-10-01 ENCOUNTER — Other Ambulatory Visit: Payer: Self-pay | Admitting: *Deleted

## 2015-10-01 ENCOUNTER — Inpatient Hospital Stay (HOSPITAL_COMMUNITY)
Admission: RE | Admit: 2015-10-01 | Discharge: 2015-10-01 | Disposition: A | Discharge status: Home / Self Care | Payer: Commercial Managed Care - HMO | Source: Ambulatory Visit

## 2015-10-01 DIAGNOSIS — I739 Peripheral vascular disease, unspecified: Secondary | ICD-10-CM

## 2015-10-01 MED ORDER — METOPROLOL SUCCINATE ER 25 MG PO TB24: 75.0000 mg | ORAL_TABLET | Freq: Every day | ORAL | Status: DC

## 2015-10-01 NOTE — Telephone Encounter (Signed)
Encounter complete. 

## 2015-10-06 ENCOUNTER — Ambulatory Visit (HOSPITAL_COMMUNITY)
Admission: RE | Admit: 2015-10-06 | Discharge: 2015-10-06 | Disposition: A | Discharge status: Home / Self Care | Payer: Commercial Managed Care - HMO | Source: Ambulatory Visit | Attending: Internal Medicine | Admitting: Internal Medicine

## 2015-10-06 DIAGNOSIS — R0600 Dyspnea, unspecified: Secondary | ICD-10-CM | POA: Diagnosis not present

## 2015-10-06 DIAGNOSIS — I10 Essential (primary) hypertension: Secondary | ICD-10-CM | POA: Diagnosis not present

## 2015-10-06 DIAGNOSIS — R5383 Other fatigue: Secondary | ICD-10-CM | POA: Insufficient documentation

## 2015-10-06 DIAGNOSIS — Z8249 Family history of ischemic heart disease and other diseases of the circulatory system: Secondary | ICD-10-CM | POA: Insufficient documentation

## 2015-10-06 DIAGNOSIS — E119 Type 2 diabetes mellitus without complications: Secondary | ICD-10-CM | POA: Insufficient documentation

## 2015-10-06 DIAGNOSIS — I517 Cardiomegaly: Secondary | ICD-10-CM | POA: Insufficient documentation

## 2015-10-06 DIAGNOSIS — R0609 Other forms of dyspnea: Secondary | ICD-10-CM | POA: Diagnosis not present

## 2015-10-06 DIAGNOSIS — R9439 Abnormal result of other cardiovascular function study: Secondary | ICD-10-CM | POA: Insufficient documentation

## 2015-10-06 DIAGNOSIS — F17200 Nicotine dependence, unspecified, uncomplicated: Secondary | ICD-10-CM | POA: Diagnosis not present

## 2015-10-06 DIAGNOSIS — R931 Abnormal findings on diagnostic imaging of heart and coronary circulation: Secondary | ICD-10-CM | POA: Diagnosis not present

## 2015-10-06 DIAGNOSIS — I739 Peripheral vascular disease, unspecified: Secondary | ICD-10-CM | POA: Diagnosis not present

## 2015-10-06 LAB — MYOCARDIAL PERFUSION IMAGING
CHL CUP NUCLEAR SDS: 0
CHL CUP RESTING HR STRESS: 78 {beats}/min
CSEPPHR: 84 {beats}/min
NUC STRESS TID: 1.27
SRS: 4
SSS: 4

## 2015-10-06 MED ORDER — TECHNETIUM TC 99M SESTAMIBI GENERIC - CARDIOLITE
10.9000 | Freq: Once | INTRAVENOUS | Status: AC | PRN
  Administered 2015-10-06: 10.9 via INTRAVENOUS

## 2015-10-06 MED ORDER — TECHNETIUM TC 99M SESTAMIBI GENERIC - CARDIOLITE
30.2000 | Freq: Once | INTRAVENOUS | Status: AC | PRN
  Administered 2015-10-06: 30.2 via INTRAVENOUS

## 2015-10-06 MED ORDER — AMINOPHYLLINE 25 MG/ML IV SOLN
75.0000 mg | Freq: Once | INTRAVENOUS | Status: AC
  Administered 2015-10-06: 75 mg via INTRAVENOUS

## 2015-10-06 MED ORDER — REGADENOSON 0.4 MG/5ML IV SOLN
0.4000 mg | Freq: Once | INTRAVENOUS | Status: AC
  Administered 2015-10-06: 0.4 mg via INTRAVENOUS

## 2015-10-09 ENCOUNTER — Encounter: Payer: Commercial Managed Care - HMO | Admitting: Pharmacist Clinician (PhC)/ Clinical Pharmacy Specialist

## 2015-10-13 ENCOUNTER — Encounter: Payer: Self-pay | Admitting: Cardiology

## 2015-10-13 ENCOUNTER — Ambulatory Visit (INDEPENDENT_AMBULATORY_CARE_PROVIDER_SITE_OTHER): Payer: Commercial Managed Care - HMO | Admitting: Pharmacist Clinician (PhC)/ Clinical Pharmacy Specialist

## 2015-10-13 ENCOUNTER — Ambulatory Visit (INDEPENDENT_AMBULATORY_CARE_PROVIDER_SITE_OTHER): Payer: Commercial Managed Care - HMO | Admitting: Cardiology

## 2015-10-13 VITALS — BP 140/60 | HR 70 | Ht 75.0 in | Wt 184.0 lb

## 2015-10-13 DIAGNOSIS — Z7901 Long term (current) use of anticoagulants: Secondary | ICD-10-CM

## 2015-10-13 DIAGNOSIS — N183 Chronic kidney disease, stage 3 unspecified: Secondary | ICD-10-CM

## 2015-10-13 DIAGNOSIS — I4891 Unspecified atrial fibrillation: Secondary | ICD-10-CM

## 2015-10-13 DIAGNOSIS — I482 Chronic atrial fibrillation, unspecified: Secondary | ICD-10-CM

## 2015-10-13 DIAGNOSIS — I4901 Ventricular fibrillation: Secondary | ICD-10-CM

## 2015-10-13 DIAGNOSIS — I429 Cardiomyopathy, unspecified: Secondary | ICD-10-CM

## 2015-10-13 DIAGNOSIS — Z5181 Encounter for therapeutic drug level monitoring: Secondary | ICD-10-CM

## 2015-10-13 DIAGNOSIS — I5021 Acute systolic (congestive) heart failure: Secondary | ICD-10-CM

## 2015-10-13 DIAGNOSIS — I2510 Atherosclerotic heart disease of native coronary artery without angina pectoris: Secondary | ICD-10-CM

## 2015-10-13 DIAGNOSIS — Z9861 Coronary angioplasty status: Secondary | ICD-10-CM

## 2015-10-13 LAB — POCT INR: INR: 1.9

## 2015-10-13 NOTE — Patient Instructions (Signed)
Your physician wants you to follow-up in: 6 MONTHS WITH DR CRENSHAW You will receive a reminder letter in the mail two months in advance. If you don't receive a letter, please call our office to schedule the follow-up appointment.  

## 2015-10-13 NOTE — Assessment & Plan Note (Signed)
Last SCr WNL- 1.20 Sep 2015

## 2015-10-13 NOTE — Assessment & Plan Note (Signed)
New drop in EF to 35-40% by echo jan 2017

## 2015-10-13 NOTE — Assessment & Plan Note (Signed)
Coumadin Rx 

## 2015-10-13 NOTE — Assessment & Plan Note (Signed)
2013 and again in 2014 (failed CFX PCI then).

## 2015-10-13 NOTE — Assessment & Plan Note (Signed)
LAD DES in '04, unsuccessful attempt at CFX PCI 2014 when he presented with VF Myoview Jan 2017- DCM, no ischemia

## 2015-10-13 NOTE — Assessment & Plan Note (Signed)
Recently admitted to [facility] with respiratory failure. It sounds like she had CAP and CHF. Her ACX was stopped and her diuretic increased for two doses.

## 2015-10-13 NOTE — Progress Notes (Signed)
10/13/2015 Logan White   03/18/1941  [id number]  Primary Physician No PCP Per Patient Primary Cardiologist: Dr [name]  HPI:  75 y/o AA male with multiple medical problems, her for me to see in follow up today. The pt had an LAD DES in 2004. In 2013 he had a VF arrest and was apparently treated medically (NL EF). He presented again in 2014 with a VF arrest and underwent cath and attempted CFX PCI but this was unsuccessful. As noted his EF was 55-60% by echo in July 2014. He saw Dr [name] in Feb 2016 and then he was seen by Ignacia Bayley Jan 3d for DOE. An echo and Myoview were ordered and the pt was then seen by Tenny Craw 09/24/15. Aaron Edelman noted new LVD on his echo with an EF of 35-40%. The pt was not in CHF. Hydralazine was added. The pt had not yet had his Myoview and this was ordered. Aaron Edelman also urged the pt to get a CXR with his smoking history but this was never done. Myoview showed DCM, no ischemia. The pt is now on my schedule for follow up. He denies any chest pain or dyspnea. He is not sure why he is here.    Current Outpatient Prescriptions  Medication Sig Dispense Refill  . albuterol (PROAIR HFA) 108 (90 Base) MCG/ACT inhaler Inhale 2 puffs into the lungs every 4 (four) hours as needed.    Marland Kitchen atorvastatin (LIPITOR) 80 MG tablet Take 1 tablet (80 mg total) by mouth daily. 30 tablet 4  . furosemide (LASIX) 20 MG tablet Take 1 tablet (20 mg total) by mouth daily. 30 tablet 6  . hydrALAZINE (APRESOLINE) 10 MG tablet Take 1 tablet (10 mg total) by mouth 3 (three) times daily. 90 tablet 6  . metoprolol succinate (TOPROL-XL) 25 MG 24 hr tablet Take 3 tablets (75 mg total) by mouth daily. 90 tablet 1  . nitroGLYCERIN (NITROSTAT) 0.4 MG SL tablet Place 1 tablet (0.4 mg total) under the tongue every 5 (five) minutes as needed for chest pain (Up to 3 doses). 25 tablet 3  . potassium chloride SA (K-DUR,KLOR-CON) 20 MEQ tablet TAKE ONE TABLET BY MOUTH TWICE DAILY 60 tablet 4  . warfarin  (COUMADIN) 2.5 MG tablet Take 1 tablet (2.5 mg total) by mouth as directed. 30 tablet 3  . [DISCONTINUED] famotidine (PEPCID) 20 MG tablet Take 1 tablet (20 mg total) by mouth 2 (two) times daily. 60 tablet 11   No current facility-administered medications for this visit.    No Known Allergies  Social History   Social History  . Marital Status: Married    Spouse Name: N/A  . Number of Children: 4  . Years of Education: N/A   Occupational History  . 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 Smoking status: Current Every Day Smoker -- 0.50 packs/day for 30 years    Types: Cigarettes    Last Attempt to Quit: 03/12/2012  . Smokeless tobacco: Not on file  . Alcohol Use: No  . Drug Use: No  . Sexual Activity: Not on file   Other Topics Concern  . 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 YES. 1/2 -1 PPD OF CIGARETTES    NO ETOH     Review of Systems: General: negative  for chills, fever, night sweats or weight changes.  Cardiovascular: negative for chest pain, dyspnea on exertion, edema, orthopnea, palpitations, paroxysmal nocturnal dyspnea or shortness of breath Dermatological: negative for rash Respiratory: negative for cough or wheezing Urologic: negative for hematuria Abdominal: negative for nausea, vomiting, diarrhea, bright red blood per rectum, melena, or hematemesis Neurologic: negative for visual changes, syncope, or dizziness All other systems reviewed and are otherwise negative except as noted above.    Blood pressure 140/60, pulse 70, height 6\' 3"  (1.905 m), weight 184 lb (83.462 kg).  General appearance: alert, cooperative, no distress and thin Neck: no JVD Lungs: clear to auscultation bilaterally Extremities: no edema Neurologic: Grossly normal   ASSESSMENT AND PLAN:    Cardiomyopathy new, appears to be NICM: EF 35-40%, no  CHF on exam.   Coronary artery disease CABG '00, PCI '06  VF (ventricular fibrillation) ([place]) 2013 and again in 2014 (failed CFX PCI then).   CAD S/P PCI LAD DES in '04, unsuccessful attempt at CFX PCI 2014 when he presented with VF Myoview Jan 2017- DCM, no ischemia  Chronic anticoagulation Coumadin Rx  CKD (chronic kidney disease), stage III Last SCr WNL- 1.20 Sep 2015  Chronic atrial fibrillation ([place]) CHADs VASc=7   PLAN  I discussed the importance of sodium restriction. He'll f/u with Dr [name] in 3 months. I did not charge him for today's visit.   Kerin Ransom K PA-C 10/13/2015 3:09 PM

## 2015-10-13 NOTE — Assessment & Plan Note (Signed)
CABG '00, PCI '06

## 2015-10-13 NOTE — Assessment & Plan Note (Signed)
CHADs VASc= 7 

## 2015-10-20 ENCOUNTER — Ambulatory Visit (HOSPITAL_COMMUNITY)
Admission: RE | Admit: 2015-10-20 | Discharge: 2015-10-20 | Disposition: A | Discharge status: Home / Self Care | Payer: Commercial Managed Care - HMO | Source: Ambulatory Visit | Attending: Cardiology | Admitting: Cardiology

## 2015-10-20 ENCOUNTER — Other Ambulatory Visit: Payer: Self-pay | Admitting: Cardiology

## 2015-10-20 DIAGNOSIS — I10 Essential (primary) hypertension: Secondary | ICD-10-CM | POA: Diagnosis not present

## 2015-10-20 DIAGNOSIS — Z8673 Personal history of transient ischemic attack (TIA), and cerebral infarction without residual deficits: Secondary | ICD-10-CM | POA: Diagnosis not present

## 2015-10-20 DIAGNOSIS — I714 Abdominal aortic aneurysm, without rupture: Secondary | ICD-10-CM | POA: Insufficient documentation

## 2015-10-20 DIAGNOSIS — I70203 Unspecified atherosclerosis of native arteries of extremities, bilateral legs: Secondary | ICD-10-CM | POA: Insufficient documentation

## 2015-10-20 DIAGNOSIS — E785 Hyperlipidemia, unspecified: Secondary | ICD-10-CM | POA: Diagnosis not present

## 2015-10-20 DIAGNOSIS — I739 Peripheral vascular disease, unspecified: Secondary | ICD-10-CM | POA: Diagnosis not present

## 2015-10-20 DIAGNOSIS — E119 Type 2 diabetes mellitus without complications: Secondary | ICD-10-CM | POA: Insufficient documentation

## 2015-10-20 DIAGNOSIS — I2510 Atherosclerotic heart disease of native coronary artery without angina pectoris: Secondary | ICD-10-CM | POA: Insufficient documentation

## 2015-10-22 ENCOUNTER — Ambulatory Visit (INDEPENDENT_AMBULATORY_CARE_PROVIDER_SITE_OTHER): Payer: Commercial Managed Care - HMO | Admitting: Cardiovascular Disease

## 2015-10-22 ENCOUNTER — Encounter: Payer: Self-pay | Admitting: Cardiovascular Disease

## 2015-10-22 VITALS — BP 134/72 | HR 76 | Ht 75.5 in | Wt 185.0 lb

## 2015-10-22 DIAGNOSIS — D689 Coagulation defect, unspecified: Secondary | ICD-10-CM

## 2015-10-22 DIAGNOSIS — R0600 Dyspnea, unspecified: Secondary | ICD-10-CM | POA: Diagnosis not present

## 2015-10-22 DIAGNOSIS — Z79899 Other long term (current) drug therapy: Secondary | ICD-10-CM

## 2015-10-22 DIAGNOSIS — I739 Peripheral vascular disease, unspecified: Secondary | ICD-10-CM | POA: Diagnosis not present

## 2015-10-22 NOTE — Patient Instructions (Signed)
Dr. Gwenlyn Found has ordered a peripheral angiogram to be done at [facility].  This procedure is going to look at the bloodflow in your lower extremities.  If Dr. Gwenlyn Found is able to open up the arteries, you will have to spend one night in the hospital.  If he is not able to open the arteries, you will be able to go home that same day.    WARFARIN (COUMADIN) WILL NEED TO BE HELD 4 DAYS PRIOR TO THE ABOVE PROCEDURE.  After the procedure, you will not be allowed to drive for 3 days or push, pull, or lift anything greater than 10 lbs for one week.    You will be required to have the following tests prior to the procedure:  1. Blood work-the blood work can be done no more than 7 days prior to the procedure.  It can be done at any [facility] lab.  There is one downstairs on the first floor of this building and one in the [facility] building [phone], [address])  2. Chest Xray-the chest xray order has already been placed at the [place].     *REPS:  NONE  Puncture :  LEFT

## 2015-10-22 NOTE — Progress Notes (Signed)
10/22/2015 Logan White   29-Jan-1941  [id number]  Primary Physician No PCP Per Patient Primary Cardiologist: [name] MD [facility], FSCAI'[  HPI:  Logan White is a 75 year old married African-American male father of 4 patient of Logan White is referred for peripheral vascular evaluation. He has ischemic cardiac myopathy status post V. Fib arrest with a history of LAD PCI October 2004. He had a second V. Fib arrest April 2014 with attempt at PCI circumflex which was unsuccessful. His EF is in the 30% range currently. He has continued tobacco abuse, hyperlipidemia and atrial fibrillation on Coumadin anticoagulation. He does have lifestyle limiting claudication with a right ABI 0.54 and a subocclusive right iliac system and a left ABI 0.40 with moderate left external iliac artery stenosis and an occluded left SFA.   Current Outpatient Prescriptions  Medication Sig Dispense Refill  . albuterol (PROAIR HFA) 108 (90 Base) MCG/ACT inhaler Inhale 2 puffs into the lungs every 4 (four) hours as needed.    Marland Kitchen atorvastatin (LIPITOR) 80 MG tablet Take 1 tablet (80 mg total) by mouth daily. 30 tablet 4  . furosemide (LASIX) 20 MG tablet Take 1 tablet (20 mg total) by mouth daily. 30 tablet 6  . hydrALAZINE (APRESOLINE) 10 MG tablet Take 1 tablet (10 mg total) by mouth 3 (three) times daily. 90 tablet 6  . metoprolol succinate (TOPROL-XL) 25 MG 24 hr tablet Take 3 tablets (75 mg total) by mouth daily. 90 tablet 1  . nitroGLYCERIN (NITROSTAT) 0.4 MG SL tablet Place 1 tablet (0.4 mg total) under the tongue every 5 (five) minutes as needed for chest pain (Up to 3 doses). 25 tablet 3  . potassium chloride SA (K-DUR,KLOR-CON) 20 MEQ tablet TAKE ONE TABLET BY MOUTH TWICE DAILY 60 tablet 4  . warfarin (COUMADIN) 2.5 MG tablet Take 1 tablet (2.5 mg total) by mouth as directed. 30 tablet 3  . [DISCONTINUED] famotidine (PEPCID) 20 MG tablet Take 1 tablet (20 mg total) by mouth 2 (two) times daily.  60 tablet 11   No current facility-administered medications for this visit.    No Known Allergies  Social History   Social History  . Marital Status: Married    Spouse Name: N/A  . Number of Children: 4  . Years of Education: N/A   Occupational History  . 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 Smoking status: Current Some Day Smoker -- 0.50 packs/day for 30 years    Types: Cigarettes    Last Attempt to Quit: 03/12/2012  . Smokeless tobacco: Not on file  . Alcohol Use: No  . Drug Use: No  . Sexual Activity: Not on file   Other Topics Concern  . 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 YES. 1/2 -1 PPD OF CIGARETTES    NO ETOH     Review of Systems: General: negative for chills, fever, night sweats or weight changes.  Cardiovascular: negative for chest pain, dyspnea on exertion, edema, orthopnea, palpitations, paroxysmal nocturnal dyspnea or shortness of breath Dermatological: negative for rash Respiratory: negative for cough or wheezing Urologic: negative for hematuria Abdominal: negative for nausea, vomiting, diarrhea, bright red blood per rectum, melena, or hematemesis Neurologic: negative for visual changes, syncope, or dizziness All other systems reviewed and are otherwise negative except  as noted above.    Blood pressure 134/72, pulse 76, height 6' 3.5" (1.918 m), weight 185 lb (83.915 kg).  General appearance: alert and no distress Neck: no adenopathy, no carotid bruit, no JVD, supple, symmetrical, trachea midline and thyroid not enlarged, symmetric, no tenderness/mass/nodules Lungs: clear to auscultation bilaterally Heart: regular rate and rhythm, S1, S2 normal, no murmur, click, rub or gallop Extremities: extremities normal, atraumatic, no cyanosis or edema  EKG not performed today  ASSESSMENT AND PLAN:   PERIPHERAL  VASCULAR DISEASE Logan White was referred to me by Logan White. 4 peripheral vascular evaluation. He is a 75 year old woman with ischemic cardiomyopathy. His EF is less than 30% range. He has  A right ABI 0.54  And a left ABI of 0.40.Marland Kitchen He has some occlusive disease in his right iliac system and what appears to be an occluded left SFA. He is on Coumadin and coagulation for A. Fib has mild renal insufficiency. He does have lifestyle limiting claudication. We have decided to proceed with outpatient angiography and potential intervention. He will stop his Coumadin 4 days prior to the procedure.      [name] MD FACP,FACC,FAHA, [facility] 10/22/2015 4:10 PM

## 2015-10-22 NOTE — Assessment & Plan Note (Signed)
[name] was referred to me by Dr. [name]. 4 peripheral vascular evaluation. He is a 76 year old woman with ischemic cardiomyopathy. His EF is less than 30% range. He has  A right ABI 0.54  And a left ABI of 0.40.Logan White He has some occlusive disease in his right iliac system and what appears to be an occluded left SFA. He is on Coumadin and coagulation for A. Fib has mild renal insufficiency. He does have lifestyle limiting claudication. We have decided to proceed with outpatient angiography and potential intervention. He will stop his Coumadin 4 days prior to the procedure.

## 2015-10-23 ENCOUNTER — Encounter: Payer: Commercial Managed Care - HMO | Admitting: Cardiovascular Disease

## 2015-10-28 ENCOUNTER — Ambulatory Visit (INDEPENDENT_AMBULATORY_CARE_PROVIDER_SITE_OTHER): Payer: Commercial Managed Care - HMO | Admitting: Pharmacist Clinician (PhC)/ Clinical Pharmacy Specialist

## 2015-10-28 DIAGNOSIS — I482 Chronic atrial fibrillation, unspecified: Secondary | ICD-10-CM

## 2015-10-28 DIAGNOSIS — I4891 Unspecified atrial fibrillation: Secondary | ICD-10-CM

## 2015-10-28 DIAGNOSIS — Z5181 Encounter for therapeutic drug level monitoring: Secondary | ICD-10-CM | POA: Diagnosis not present

## 2015-10-28 DIAGNOSIS — Z7901 Long term (current) use of anticoagulants: Secondary | ICD-10-CM | POA: Diagnosis not present

## 2015-10-28 LAB — POCT INR: INR: 3.8

## 2015-11-04 ENCOUNTER — Other Ambulatory Visit: Payer: Self-pay | Admitting: *Deleted

## 2015-11-04 MED ORDER — FUROSEMIDE 20 MG PO TABS: 20.0000 mg | ORAL_TABLET | Freq: Every day | ORAL | Status: DC

## 2015-11-13 ENCOUNTER — Encounter: Payer: Commercial Managed Care - HMO | Admitting: Pharmacist Clinician (PhC)/ Clinical Pharmacy Specialist

## 2015-11-20 ENCOUNTER — Other Ambulatory Visit: Payer: Self-pay

## 2015-11-20 DIAGNOSIS — I739 Peripheral vascular disease, unspecified: Secondary | ICD-10-CM

## 2015-11-23 ENCOUNTER — Ambulatory Visit (HOSPITAL_COMMUNITY)
Admission: RE | Admit: 2015-11-23 | Discharge: 2015-11-23 | Disposition: A | Discharge status: Home / Self Care | Payer: Commercial Managed Care - HMO | Source: Ambulatory Visit | Attending: Cardiovascular Disease | Admitting: Cardiovascular Disease

## 2015-11-23 ENCOUNTER — Encounter (HOSPITAL_COMMUNITY)
Admission: RE | Disposition: A | Discharge status: Home / Self Care | Payer: Self-pay | Source: Ambulatory Visit | Attending: Cardiovascular Disease

## 2015-11-23 DIAGNOSIS — I739 Peripheral vascular disease, unspecified: Secondary | ICD-10-CM | POA: Insufficient documentation

## 2015-11-23 DIAGNOSIS — Z539 Procedure and treatment not carried out, unspecified reason: Secondary | ICD-10-CM | POA: Diagnosis not present

## 2015-11-23 DIAGNOSIS — I2510 Atherosclerotic heart disease of native coronary artery without angina pectoris: Secondary | ICD-10-CM

## 2015-11-23 DIAGNOSIS — Z9861 Coronary angioplasty status: Secondary | ICD-10-CM

## 2015-11-23 LAB — PROTIME-INR
INR: 3.65 — AB (ref 0.00–1.49)
Prothrombin Time: 35.4 seconds — ABNORMAL HIGH (ref 11.6–15.2)

## 2015-11-23 LAB — BASIC METABOLIC PANEL
ANION GAP: 12 (ref 5–15)
BUN: 16 mg/dL (ref 6–20)
CALCIUM: 9.3 mg/dL (ref 8.9–10.3)
CO2: 21 mmol/L — AB (ref 22–32)
Chloride: 106 mmol/L (ref 101–111)
Creatinine, Ser: 1.59 mg/dL — ABNORMAL HIGH (ref 0.61–1.24)
GFR, EST AFRICAN AMERICAN: 48 mL/min — AB (ref >60)
GFR, EST NON AFRICAN AMERICAN: 42 mL/min — AB (ref >60)
Glucose, Bld: 97 mg/dL (ref 65–99)
POTASSIUM: 3.8 mmol/L (ref 3.5–5.1)
Sodium: 139 mmol/L (ref 135–145)

## 2015-11-23 LAB — CBC
HEMATOCRIT: 42.3 % (ref 39.0–52.0)
Hemoglobin: 15.1 g/dL (ref 13.0–17.0)
MCH: 26.8 pg (ref 26.0–34.0)
MCHC: 35.7 g/dL (ref 30.0–36.0)
MCV: 75.1 fL — ABNORMAL LOW (ref 78.0–100.0)
PLATELETS: 179 10*3/uL (ref 150–400)
RBC: 5.63 MIL/uL (ref 4.22–5.81)
RDW: 15.6 % — AB (ref 11.5–15.5)
WBC: 6.1 10*3/uL (ref 4.0–10.5)

## 2015-11-23 LAB — GLUCOSE, CAPILLARY: GLUCOSE-CAPILLARY: 96 mg/dL (ref 65–99)

## 2015-11-23 SURGERY — LOWER EXTREMITY ANGIOGRAPHY

## 2015-11-23 MED ORDER — SODIUM CHLORIDE 0.9% FLUSH: 3.0000 mL | INTRAVENOUS | Status: DC | PRN

## 2015-11-23 MED ORDER — SODIUM CHLORIDE 0.9 % WEIGHT BASED INFUSION
3.0000 mL/kg/h | INTRAVENOUS | Status: AC
  Administered 2015-11-23: 3 mL/kg/h via INTRAVENOUS

## 2015-11-23 MED ORDER — SODIUM CHLORIDE 0.9 % WEIGHT BASED INFUSION: 1.0000 mL/kg/h | INTRAVENOUS | Status: DC

## 2015-11-24 DIAGNOSIS — Z539 Procedure and treatment not carried out, unspecified reason: Secondary | ICD-10-CM | POA: Diagnosis not present

## 2015-11-24 DIAGNOSIS — I739 Peripheral vascular disease, unspecified: Secondary | ICD-10-CM | POA: Diagnosis not present

## 2015-11-26 ENCOUNTER — Telehealth: Payer: Self-pay

## 2015-11-26 NOTE — Telephone Encounter (Signed)
Left message to call back.  Pt needs to be scheduled for PV angio and strong reminder to hold Coumidan for 5 days prior.

## 2015-11-30 ENCOUNTER — Other Ambulatory Visit: Payer: Self-pay

## 2015-11-30 ENCOUNTER — Telehealth: Payer: Self-pay | Admitting: Cardiovascular Disease

## 2015-11-30 DIAGNOSIS — I739 Peripheral vascular disease, unspecified: Secondary | ICD-10-CM

## 2015-11-30 NOTE — Telephone Encounter (Signed)
Joelene Millin - looks like you've been trying to reach him.

## 2015-11-30 NOTE — Telephone Encounter (Signed)
Spoke with pt. Scheduled for him to visit coumadin clinic on 4/3 @ 9 am to discuss his coumadin HOLD before PV angio.  Pt scheduled to have PV angio on 4/10 @ 7:30am arrive at 5:30am.  Pt knows he will need repeat blood work and can pickup details for procedure when he see [name], PharmD Pt given office number to call if has any questions or concerns.

## 2015-11-30 NOTE — Telephone Encounter (Signed)
NEW MESSAGE   Pt wife is calling for pt surgery to be rescheduled with Dr.Berry

## 2015-11-30 NOTE — Telephone Encounter (Signed)
Pt rescheduled for PV angio on 4/10

## 2015-11-30 NOTE — Telephone Encounter (Signed)
Returned call to patient. Notes that his INR was too high to be able to have claudication procedure done. He wants to have this rescheduled. He has INR checks done at [facility]. Informed pt I would sent to [facility] for arrangement of return check and/or recommendations.

## 2015-12-07 ENCOUNTER — Ambulatory Visit (INDEPENDENT_AMBULATORY_CARE_PROVIDER_SITE_OTHER): Payer: Commercial Managed Care - HMO | Admitting: Pharmacist Clinician (PhC)/ Clinical Pharmacy Specialist

## 2015-12-07 DIAGNOSIS — I482 Chronic atrial fibrillation, unspecified: Secondary | ICD-10-CM

## 2015-12-07 DIAGNOSIS — Z5181 Encounter for therapeutic drug level monitoring: Secondary | ICD-10-CM

## 2015-12-07 DIAGNOSIS — Z7901 Long term (current) use of anticoagulants: Secondary | ICD-10-CM

## 2015-12-07 DIAGNOSIS — I4891 Unspecified atrial fibrillation: Secondary | ICD-10-CM | POA: Diagnosis not present

## 2015-12-07 LAB — POCT INR: INR: 3.4

## 2015-12-14 ENCOUNTER — Telehealth: Payer: Self-pay | Admitting: *Deleted

## 2015-12-14 ENCOUNTER — Encounter (HOSPITAL_COMMUNITY): Admission: RE | Payer: Self-pay | Source: Ambulatory Visit

## 2015-12-14 ENCOUNTER — Ambulatory Visit (HOSPITAL_COMMUNITY)
Admission: RE | Admit: 2015-12-14 | Payer: Commercial Managed Care - HMO | Source: Ambulatory Visit | Admitting: Cardiovascular Disease

## 2015-12-14 SURGERY — ABDOMINAL AORTOGRAM
Anesthesia: LOCAL

## 2015-12-14 NOTE — Telephone Encounter (Signed)
Spoke with Mrs. Logan White regarding rescheduling of Lower extremiity angiogram that was cancelled for Monday 12/14/15.  This procedure has been rescheduled for Thursday 12/17/15 --procedure time is 7:30 am--arrive at Short Stay cent at [facility] at 5:30 am..  [name] stated that [name] took Coumadin last night.  I reached out to [name], RN and she replied   [12/14/2015 11:46 AM] [name]:  Per [name]: also will you put all of this in documentation of call Yes, if he held it for 4 days prior to that and 3 days now, that one dose should not make any difference.  Leave the procedure for Thursday.  I told Mrs. Logan White not let [name] take any more Coumadin.  She voiced her understanding.

## 2015-12-15 ENCOUNTER — Telehealth: Payer: Self-pay | Admitting: *Deleted

## 2015-12-15 NOTE — Telephone Encounter (Signed)
Spoke with Logan White regarding the rescheduling of lower extremity angiogram on Thursday 12/17/15 to Monday 12/21/15.  Times will remain the same-----procedure at 7:30 am arrive at Short Stay center at 5:30 am NPO after midnight,  Continue to hold your coumadin until you are instructed differently.  [name] voiced her understanding.Marland Kitchen

## 2015-12-21 ENCOUNTER — Ambulatory Visit (HOSPITAL_COMMUNITY)
Admission: RE | Admit: 2015-12-21 | Discharge: 2015-12-21 | Disposition: A | Discharge status: Home / Self Care | Payer: Commercial Managed Care - HMO | Source: Ambulatory Visit | Attending: Cardiovascular Disease | Admitting: Cardiovascular Disease

## 2015-12-21 ENCOUNTER — Encounter (HOSPITAL_COMMUNITY)
Admission: RE | Disposition: A | Discharge status: Home / Self Care | Payer: Self-pay | Source: Ambulatory Visit | Attending: Cardiovascular Disease

## 2015-12-21 ENCOUNTER — Encounter (HOSPITAL_COMMUNITY): Payer: Self-pay | Admitting: Cardiovascular Disease

## 2015-12-21 DIAGNOSIS — I4891 Unspecified atrial fibrillation: Secondary | ICD-10-CM | POA: Insufficient documentation

## 2015-12-21 DIAGNOSIS — I255 Ischemic cardiomyopathy: Secondary | ICD-10-CM | POA: Diagnosis not present

## 2015-12-21 DIAGNOSIS — Z79899 Other long term (current) drug therapy: Secondary | ICD-10-CM | POA: Diagnosis not present

## 2015-12-21 DIAGNOSIS — F17210 Nicotine dependence, cigarettes, uncomplicated: Secondary | ICD-10-CM | POA: Insufficient documentation

## 2015-12-21 DIAGNOSIS — I739 Peripheral vascular disease, unspecified: Secondary | ICD-10-CM

## 2015-12-21 DIAGNOSIS — Z7901 Long term (current) use of anticoagulants: Secondary | ICD-10-CM | POA: Insufficient documentation

## 2015-12-21 DIAGNOSIS — E785 Hyperlipidemia, unspecified: Secondary | ICD-10-CM | POA: Insufficient documentation

## 2015-12-21 DIAGNOSIS — Z95820 Peripheral vascular angioplasty status with implants and grafts: Secondary | ICD-10-CM | POA: Insufficient documentation

## 2015-12-21 DIAGNOSIS — I70212 Atherosclerosis of native arteries of extremities with intermittent claudication, left leg: Secondary | ICD-10-CM | POA: Diagnosis not present

## 2015-12-21 HISTORY — PX: PERIPHERAL VASCULAR CATHETERIZATION: SHX172C

## 2015-12-21 LAB — BASIC METABOLIC PANEL
ANION GAP: 9 (ref 5–15)
BUN: 21 mg/dL — AB (ref 6–20)
CHLORIDE: 111 mmol/L (ref 101–111)
CO2: 23 mmol/L (ref 22–32)
Calcium: 8.9 mg/dL (ref 8.9–10.3)
Creatinine, Ser: 1.65 mg/dL — ABNORMAL HIGH (ref 0.61–1.24)
GFR calc Af Amer: 46 mL/min — ABNORMAL LOW (ref >60)
GFR, EST NON AFRICAN AMERICAN: 40 mL/min — AB (ref >60)
GLUCOSE: 99 mg/dL (ref 65–99)
POTASSIUM: 3.9 mmol/L (ref 3.5–5.1)
Sodium: 143 mmol/L (ref 135–145)

## 2015-12-21 LAB — CBC
HEMATOCRIT: 38.3 % — AB (ref 39.0–52.0)
HEMOGLOBIN: 13.3 g/dL (ref 13.0–17.0)
MCH: 26 pg (ref 26.0–34.0)
MCHC: 34.7 g/dL (ref 30.0–36.0)
MCV: 75 fL — AB (ref 78.0–100.0)
PLATELETS: 189 10*3/uL (ref 150–400)
RBC: 5.11 MIL/uL (ref 4.22–5.81)
RDW: 15.8 % — ABNORMAL HIGH (ref 11.5–15.5)
WBC: 6.7 10*3/uL (ref 4.0–10.5)

## 2015-12-21 LAB — PROTIME-INR
INR: 1.34 (ref 0.00–1.49)
PROTHROMBIN TIME: 16.7 s — AB (ref 11.6–15.2)

## 2015-12-21 SURGERY — LOWER EXTREMITY ANGIOGRAPHY

## 2015-12-21 MED ORDER — ACETAMINOPHEN 325 MG PO TABS: 650.0000 mg | ORAL_TABLET | ORAL | Status: DC | PRN

## 2015-12-21 MED ORDER — HEPARIN (PORCINE) IN NACL 2-0.9 UNIT/ML-% IJ SOLN
INTRAMUSCULAR | Status: AC
  Filled 2015-12-21: qty 1000

## 2015-12-21 MED ORDER — FENTANYL CITRATE (PF) 100 MCG/2ML IJ SOLN
INTRAMUSCULAR | Status: DC | PRN
  Administered 2015-12-21: 25 ug via INTRAVENOUS

## 2015-12-21 MED ORDER — SODIUM CHLORIDE 0.9 % IV SOLN: INTRAVENOUS | Status: AC

## 2015-12-21 MED ORDER — NITROGLYCERIN 1 MG/10 ML FOR IR/CATH LAB
INTRA_ARTERIAL | Status: AC
  Filled 2015-12-21: qty 10

## 2015-12-21 MED ORDER — LIDOCAINE HCL (PF) 1 % IJ SOLN
INTRAMUSCULAR | Status: AC
  Filled 2015-12-21: qty 30

## 2015-12-21 MED ORDER — SODIUM CHLORIDE 0.9 % IV SOLN
INTRAVENOUS | Status: DC | PRN
  Administered 2015-12-21: 75 mL/h via INTRAVENOUS

## 2015-12-21 MED ORDER — HEPARIN (PORCINE) IN NACL 2-0.9 UNIT/ML-% IJ SOLN
INTRAMUSCULAR | Status: DC | PRN
  Administered 2015-12-21: 1000 mL

## 2015-12-21 MED ORDER — NITROGLYCERIN 1 MG/10 ML FOR IR/CATH LAB
INTRA_ARTERIAL | Status: DC | PRN
  Administered 2015-12-21: 200 ug

## 2015-12-21 MED ORDER — LIDOCAINE HCL (PF) 1 % IJ SOLN
INTRAMUSCULAR | Status: DC | PRN
  Administered 2015-12-21: 30 mL

## 2015-12-21 MED ORDER — SODIUM CHLORIDE 0.9 % WEIGHT BASED INFUSION: 1.0000 mL/kg/h | INTRAVENOUS | Status: DC

## 2015-12-21 MED ORDER — IODIXANOL 320 MG/ML IV SOLN
INTRAVENOUS | Status: DC | PRN
  Administered 2015-12-21: 65 mL via INTRAVENOUS

## 2015-12-21 MED ORDER — SODIUM CHLORIDE 0.9% FLUSH: 3.0000 mL | INTRAVENOUS | Status: DC | PRN

## 2015-12-21 MED ORDER — SODIUM CHLORIDE 0.9 % WEIGHT BASED INFUSION
3.0000 mL/kg/h | INTRAVENOUS | Status: DC
  Administered 2015-12-21: 3 mL/kg/h via INTRAVENOUS

## 2015-12-21 MED ORDER — FENTANYL CITRATE (PF) 100 MCG/2ML IJ SOLN
INTRAMUSCULAR | Status: AC
  Filled 2015-12-21: qty 2

## 2015-12-21 MED ORDER — ONDANSETRON HCL 4 MG/2ML IJ SOLN
4.0000 mg | Freq: Four times a day (QID) | INTRAMUSCULAR | Status: DC | PRN
Start: 2015-12-21 — End: 2015-12-21

## 2015-12-21 SURGICAL SUPPLY — 10 items
CATH ANGIO 5F PIGTAIL 65CM (CATHETERS) ×2 IMPLANT
CATH CROSS OVER TEMPO 5F (CATHETERS) ×2 IMPLANT
CATH STRAIGHT 5FR 65CM (CATHETERS) ×2 IMPLANT
KIT PV (KITS) ×3 IMPLANT
SHEATH PINNACLE 5F 10CM (SHEATH) ×2 IMPLANT
STOPCOCK MORSE 400PSI 3WAY (MISCELLANEOUS) ×2 IMPLANT
SYRINGE MEDRAD AVANTA MACH 7 (SYRINGE) ×2 IMPLANT
TRANSDUCER W/STOPCOCK (MISCELLANEOUS) ×3 IMPLANT
TRAY PV CATH (CUSTOM PROCEDURE TRAY) ×3 IMPLANT
WIRE HITORQ VERSACORE ST 145CM (WIRE) ×2 IMPLANT

## 2015-12-21 NOTE — H&P (Signed)
11/20/15 Logan White  15-Mar-1941  [id number]  Primary Physician No PCP Per Patient Primary Cardiologist: [name] MD [facility], FSCAI'[  HPI: Logan White is a 75 year old married African-American male father of 4 patient of Logan White is referred for peripheral vascular evaluation. He has ischemic cardiac myopathy status post V. Fib arrest with a history of LAD PCI October 2004. He had a second V. Fib arrest April 2014 with attempt at PCI circumflex which was unsuccessful. His EF is in the 30% range currently. He has continued tobacco abuse, hyperlipidemia and atrial fibrillation on Coumadin anticoagulation. He does have lifestyle limiting claudication with a right ABI 0.54 and a subocclusive right iliac system and a left ABI 0.40 with moderate left external iliac artery stenosis and an occluded left SFA.   Current Outpatient Prescriptions  Medication Sig Dispense Refill  . albuterol (PROAIR HFA) 108 (90 Base) MCG/ACT inhaler Inhale 2 puffs into the lungs every 4 (four) hours as needed.    Marland Kitchen atorvastatin (LIPITOR) 80 MG tablet Take 1 tablet (80 mg total) by mouth daily. 30 tablet 4  . furosemide (LASIX) 20 MG tablet Take 1 tablet (20 mg total) by mouth daily. 30 tablet 6  . hydrALAZINE (APRESOLINE) 10 MG tablet Take 1 tablet (10 mg total) by mouth 3 (three) times daily. 90 tablet 6  . metoprolol succinate (TOPROL-XL) 25 MG 24 hr tablet Take 3 tablets (75 mg total) by mouth daily. 90 tablet 1  . nitroGLYCERIN (NITROSTAT) 0.4 MG SL tablet Place 1 tablet (0.4 mg total) under the tongue every 5 (five) minutes as needed for chest pain (Up to 3 doses). 25 tablet 3  . potassium chloride SA (K-DUR,KLOR-CON) 20 MEQ tablet TAKE ONE TABLET BY MOUTH TWICE DAILY 60 tablet 4  . warfarin (COUMADIN) 2.5 MG tablet Take 1 tablet (2.5 mg total) by mouth as directed. 30 tablet 3  . [DISCONTINUED] famotidine (PEPCID) 20 MG tablet Take 1  tablet (20 mg total) by mouth 2 (two) times daily. 60 tablet 11   No current facility-administered medications for this visit.    No Known Allergies  Social History   Social History  . Marital Status: Married    Spouse Name: N/A  . Number of Children: 4  . Years of Education: N/A   Occupational History  . 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 Smoking status: Current Some Day Smoker -- 0.50 packs/day for 30 years    Types: Cigarettes    Last Attempt to Quit: 03/12/2012  . Smokeless tobacco: Not on file  . Alcohol Use: No  . Drug Use: No  . Sexual Activity: Not on file   Other Topics Concern  . Not on file   Social History Narrative   ** Merged History Encounter **      MARRIED   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 YES. 1/2 -1 PPD OF CIGARETTES    NO ETOH     Review of Systems: General: negative for chills, fever, night sweats or weight changes.  Cardiovascular: negative for chest pain, dyspnea on exertion, edema, orthopnea, palpitations, paroxysmal nocturnal dyspnea or shortness of breath Dermatological: negative for rash Respiratory: negative for cough or wheezing Urologic: negative for hematuria Abdominal: negative for nausea, vomiting, diarrhea, bright red blood per rectum, melena, or hematemesis Neurologic: negative for visual changes, syncope, or dizziness All other systems reviewed and are otherwise negative except as noted  above.    Blood pressure 134/72, pulse 76, height 6' 3.5" (1.918 m), weight 185 lb (83.915 kg).  General appearance: alert and no distress Neck: no adenopathy, no carotid bruit, no JVD, supple, symmetrical, trachea midline and thyroid not enlarged, symmetric, no tenderness/mass/nodules Lungs: clear to auscultation bilaterally Heart: regular rate and rhythm, S1, S2 normal, no murmur, click, rub or  gallop Extremities: extremities normal, atraumatic, no cyanosis or edema  EKG not performed today  ASSESSMENT AND PLAN:   PERIPHERAL VASCULAR DISEASE [name] was referred to me by Logan White. 4 peripheral vascular evaluation. He is a 75 year old woman with ischemic cardiomyopathy. His EF is less than 30% range. He has A right ABI 0.54 And a left ABI of 0.40.Marland Kitchen He has some occlusive disease in his right iliac system and what appears to be an occluded left SFA. He is on Coumadin and coagulation for A. Fib has mild renal insufficiency. He does have lifestyle limiting claudication. We have decided to proceed with outpatient angiography and potential intervention. He will stop his Coumadin 4 days prior to the procedure.     [name], M.D., [place], [facility], Laverta [place] [address] [place], [place]  [address]  [phone] 12/21/2015 7:05 AM

## 2015-12-21 NOTE — Discharge Instructions (Signed)

## 2015-12-21 NOTE — Progress Notes (Signed)
Site area: lt groin Site Prior to Removal:  Level 0 Pressure Applied For:  20 minutes Manual:   yes Patient Status During Pull:  stable Post Pull Site:  Level  0 Post Pull Instructions Given:  yes Post Pull Pulses Present: yes Dressing Applied:  tegaderm Bedrest begins @  [id number] Comments:

## 2015-12-25 ENCOUNTER — Ambulatory Visit (INDEPENDENT_AMBULATORY_CARE_PROVIDER_SITE_OTHER): Payer: Commercial Managed Care - HMO | Admitting: Pharmacist Clinician (PhC)/ Clinical Pharmacy Specialist

## 2015-12-25 DIAGNOSIS — I4891 Unspecified atrial fibrillation: Secondary | ICD-10-CM | POA: Diagnosis not present

## 2015-12-25 DIAGNOSIS — Z7901 Long term (current) use of anticoagulants: Secondary | ICD-10-CM

## 2015-12-25 DIAGNOSIS — Z5181 Encounter for therapeutic drug level monitoring: Secondary | ICD-10-CM

## 2015-12-25 DIAGNOSIS — I482 Chronic atrial fibrillation, unspecified: Secondary | ICD-10-CM

## 2015-12-25 LAB — POCT INR: INR: 1.8

## 2016-01-08 ENCOUNTER — Other Ambulatory Visit: Payer: Self-pay | Admitting: Pharmacist Clinician (PhC)/ Clinical Pharmacy Specialist

## 2016-01-08 ENCOUNTER — Encounter: Payer: Commercial Managed Care - HMO | Admitting: Pharmacist Clinician (PhC)/ Clinical Pharmacy Specialist

## 2016-01-08 ENCOUNTER — Other Ambulatory Visit: Payer: Self-pay | Admitting: Cardiology

## 2016-01-08 MED ORDER — WARFARIN SODIUM 2.5 MG PO TABS: ORAL_TABLET | ORAL | Status: DC

## 2016-01-08 NOTE — Telephone Encounter (Signed)
Rx(s) sent to pharmacy electronically.  

## 2016-01-19 ENCOUNTER — Encounter: Payer: Self-pay | Admitting: Cardiovascular Disease

## 2016-01-19 ENCOUNTER — Ambulatory Visit (INDEPENDENT_AMBULATORY_CARE_PROVIDER_SITE_OTHER): Payer: Commercial Managed Care - HMO | Admitting: Cardiovascular Disease

## 2016-01-19 VITALS — BP 143/55 | HR 70 | Ht 75.5 in | Wt 180.4 lb

## 2016-01-19 DIAGNOSIS — I739 Peripheral vascular disease, unspecified: Secondary | ICD-10-CM | POA: Diagnosis not present

## 2016-01-19 NOTE — Progress Notes (Signed)
Logan White returns for follow-up of his recent angiogram which I performed 12/21/15. He had an occluded right external iliac artery and common femoral artery as well as an occluded left SFA both of which were not percutaneously addressable.I recommend no intervention at this time. I will see him back as needed.

## 2016-01-19 NOTE — Assessment & Plan Note (Signed)
Logan White returns today after his recent angiogram which I performed 12/21/15 for symptomatically on occasion.He had an occluded right external iliac and common femoral artery not percutaneously addressable, 80% left common femoral artery stenosis without a pullback gradient and an occluded left SFA from the origin down to the adductor canal with one-vessel runoff on the left. This likewise was not percutaneously addressable. He will need conservative medical therapy. Given his cardiac situation I do not think he is an operative candidate as well.I will see him back when necessary.

## 2016-01-19 NOTE — Patient Instructions (Signed)
Medication Instructions:  Your physician recommends that you continue on your current medications as directed. Please refer to the Current Medication list given to you today.   Follow-Up: Your physician recommends that you schedule a follow-up appointment ON AN AS NEEDED BASIS.  If you need a refill on your cardiac medications before your next appointment, please call your pharmacy.   

## 2016-03-26 ENCOUNTER — Other Ambulatory Visit: Payer: Self-pay | Admitting: Cardiology

## 2016-05-10 ENCOUNTER — Other Ambulatory Visit: Payer: Self-pay | Admitting: Cardiology

## 2016-06-06 ENCOUNTER — Other Ambulatory Visit: Payer: Self-pay | Admitting: Cardiovascular Disease

## 2016-06-06 MED ORDER — HYDRALAZINE HCL 10 MG PO TABS: 10.0000 mg | ORAL_TABLET | Freq: Three times a day (TID) | ORAL | 6 refills | Status: DC

## 2016-06-06 NOTE — Telephone Encounter (Signed)
error 

## 2016-06-13 ENCOUNTER — Other Ambulatory Visit: Payer: Self-pay | Admitting: Cardiovascular Disease

## 2016-06-17 ENCOUNTER — Other Ambulatory Visit: Payer: Self-pay | Admitting: Cardiology

## 2016-06-17 DIAGNOSIS — I6523 Occlusion and stenosis of bilateral carotid arteries: Secondary | ICD-10-CM

## 2016-06-24 ENCOUNTER — Encounter (INDEPENDENT_AMBULATORY_CARE_PROVIDER_SITE_OTHER): Payer: Commercial Managed Care - HMO

## 2016-06-24 ENCOUNTER — Ambulatory Visit (HOSPITAL_COMMUNITY)
Admission: RE | Admit: 2016-06-24 | Discharge: 2016-06-24 | Disposition: A | Discharge status: Home / Self Care | Payer: Commercial Managed Care - HMO | Source: Ambulatory Visit | Attending: Cardiology | Admitting: Cardiology

## 2016-06-24 DIAGNOSIS — Z7901 Long term (current) use of anticoagulants: Secondary | ICD-10-CM | POA: Diagnosis not present

## 2016-06-24 DIAGNOSIS — I4891 Unspecified atrial fibrillation: Secondary | ICD-10-CM

## 2016-06-24 DIAGNOSIS — I6523 Occlusion and stenosis of bilateral carotid arteries: Secondary | ICD-10-CM | POA: Insufficient documentation

## 2016-07-20 ENCOUNTER — Ambulatory Visit (INDEPENDENT_AMBULATORY_CARE_PROVIDER_SITE_OTHER): Payer: Commercial Managed Care - HMO | Admitting: Cardiovascular Disease

## 2016-07-20 ENCOUNTER — Encounter: Payer: Self-pay | Admitting: Cardiovascular Disease

## 2016-07-20 VITALS — BP 140/66 | HR 86 | Ht 75.5 in | Wt 183.0 lb

## 2016-07-20 DIAGNOSIS — I739 Peripheral vascular disease, unspecified: Secondary | ICD-10-CM

## 2016-07-20 NOTE — Assessment & Plan Note (Signed)
Logan White returns for follow-up of his peripheral vascular disease. He is referred to me by Dr. [name] for lifestyle including claudication. I angiogram him 12/21/15 revealing a long occluded right external iliac artery extending down to the right common femoral artery as well as a long occluded left SFA both of which I thought were not percutaneously addressable. He really denies lifestyle including claudication. Should he require catheterization he could have a left-to-right femorofemoral crossover graft after opening up his distal left external iliac artery percutaneously. Otherwise, I will see him back when necessary.

## 2016-07-20 NOTE — Progress Notes (Signed)
07/20/2016 Logan White   [birth year]  [id number]  Primary Physician No PCP Per Patient Primary Cardiologist: [name] MD [name]  HPI:  Logan White is a 75 year old married African-American male father of 4 patient of Dr. [name] is referred for peripheral vascular evaluation. I last saw him in the office 01/19/16. He has ischemic cardiac myopathy status post V. Fib arrest with a history of LAD PCI October 2004. He had a second V. Fib arrest April 2014 with attempt at PCI circumflex which was unsuccessful. His EF is in the 30% range currently. He has continued tobacco abuse, hyperlipidemia and atrial fibrillation on Coumadin anticoagulation. He does have lifestyle limiting claudication with a right ABI 0.54 and a subocclusive right iliac system and a left ABI 0.40 with moderate left external iliac artery stenosis and an occluded left SFA. He has been off his Coumadin for the last 2 weeks. He underwent peripheral angiography by myself 12/21/15 revealing a long occluded right external iliac artery as well as left SFA both of which were not percutaneously addressable. He does have some claudication which does not appear to be lifestyle limiting at this time.   Current Outpatient Prescriptions  Medication Sig Dispense Refill  . albuterol (PROVENTIL HFA;VENTOLIN HFA) 108 (90 Base) MCG/ACT inhaler Inhale into the lungs every 6 (six) hours as needed for wheezing or shortness of breath.    Marland Kitchen atorvastatin (LIPITOR) 80 MG tablet TAKE 1 TABLET (80 MG TOTAL) BY MOUTH DAILY. 30 tablet 4  . furosemide (LASIX) 20 MG tablet Take 1 tablet (20 mg total) by mouth daily. (Patient taking differently: Take 20 mg by mouth at bedtime. ) 30 tablet 10  . hydrALAZINE (APRESOLINE) 10 MG tablet Take 1 tablet (10 mg total) by mouth 3 (three) times daily. 90 tablet 6  . ibuprofen (ADVIL,MOTRIN) 200 MG tablet Take 400 mg by mouth every 6 (six) hours as needed for headache.    . metoprolol  succinate (TOPROL-XL) 25 MG 24 hr tablet TAKE 3 TABLETS (75 MG TOTAL) BY MOUTH DAILY. 90 tablet 9  . nitroGLYCERIN (NITROSTAT) 0.4 MG SL tablet Place 1 tablet (0.4 mg total) under the tongue every 5 (five) minutes as needed for chest pain (Up to 3 doses). 25 tablet 3  . potassium chloride SA (K-DUR,KLOR-CON) 20 MEQ tablet TAKE ONE TABLET BY MOUTH TWICE DAILY 60 tablet 4  . warfarin (COUMADIN) 2.5 MG tablet TAKE 1 TABLET BY MOUTH DAILY OR AS DIRECTED BY COUMADIN CLINIC 30 tablet 0   No current facility-administered medications for this visit.     No Known Allergies  Social History   Social History  . Marital status: Married    Spouse name: N/A  . Number of children: 4  . Years of education: N/A   Occupational History  . 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 Smoking status: Current Some Day Smoker    Packs/day: 0.50    Years: 30.00    Types: Cigarettes    Last attempt to quit: 03/12/2012  . Smokeless tobacco: Not on file  . Alcohol use No  . Drug use: No  . Sexual activity: Not on file   Other Topics Concern  . 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 YES. 1/2 -1 PPD OF CIGARETTES  NO ETOH     Review of Systems: General: negative for chills, fever, night sweats or weight changes.  Cardiovascular: negative for chest pain, dyspnea on exertion, edema, orthopnea, palpitations, paroxysmal nocturnal dyspnea or shortness of breath Dermatological: negative for rash Respiratory: negative for cough or wheezing Urologic: negative for hematuria Abdominal: negative for nausea, vomiting, diarrhea, bright red blood per rectum, melena, or hematemesis Neurologic: negative for visual changes, syncope, or dizziness All other systems reviewed and are otherwise negative except as noted above.    Blood pressure 140/66, pulse 86, height 6' 3.5"  (1.918 m), weight 183 lb (83 kg).  General appearance: alert and no distress Neck: no adenopathy, no carotid bruit, no JVD, supple, symmetrical, trachea midline and thyroid not enlarged, symmetric, no tenderness/mass/nodules Lungs: clear to auscultation bilaterally Heart: regular rate and rhythm, S1, S2 normal, no murmur, click, rub or gallop Extremities: extremities normal, atraumatic, no cyanosis or edema  EKG A. fib relation with a response of 86, occasional aberrantly conducted beat and nonspecific ST and T-wave changes with septal Q waves. I personally reviewed this EKG  ASSESSMENT AND PLAN:   PERIPHERAL VASCULAR DISEASE Logan White returns for follow-up of his peripheral vascular disease. He is referred to me by Dr. [name] for lifestyle including claudication. I angiogram him 12/21/15 revealing a long occluded right external iliac artery extending down to the right common femoral artery as well as a long occluded left SFA both of which I thought were not percutaneously addressable. He really denies lifestyle including claudication. Should he require catheterization he could have a left-to-right femorofemoral crossover graft after opening up his distal left external iliac artery percutaneously. Otherwise, I will see him back when necessary.      [name] MD FACP,FACC,FAHA, [facility] 07/20/2016 12:33 PM

## 2016-07-20 NOTE — Patient Instructions (Signed)
Medication Instructions: Your physician recommends that you continue on your current medications as directed. Please refer to the Current Medication list given to you today.   Follow-Up: Your physician recommends that you schedule a follow-up appointment in: 1-2 months with Dr. [name] and as needed with Dr. Gwenlyn Found.  If you need a refill on your cardiac medications before your next appointment, please call your pharmacy.

## 2016-08-06 ENCOUNTER — Other Ambulatory Visit: Payer: Self-pay | Admitting: Cardiovascular Disease

## 2016-08-09 ENCOUNTER — Telehealth: Payer: Self-pay | Admitting: Pharmacist Clinician (PhC)/ Clinical Pharmacy Specialist

## 2016-08-09 NOTE — Telephone Encounter (Signed)
Spoke with patient, scheduled INR for Friday.  He states he has enough warfarin to last until then

## 2016-08-23 ENCOUNTER — Ambulatory Visit (INDEPENDENT_AMBULATORY_CARE_PROVIDER_SITE_OTHER): Payer: Commercial Managed Care - HMO | Admitting: Pharmacist

## 2016-08-23 DIAGNOSIS — Z5181 Encounter for therapeutic drug level monitoring: Secondary | ICD-10-CM | POA: Diagnosis not present

## 2016-08-23 DIAGNOSIS — I482 Chronic atrial fibrillation, unspecified: Secondary | ICD-10-CM

## 2016-08-23 LAB — POCT INR: INR: 1.2

## 2016-09-01 ENCOUNTER — Ambulatory Visit (INDEPENDENT_AMBULATORY_CARE_PROVIDER_SITE_OTHER): Payer: Commercial Managed Care - HMO | Admitting: Pharmacist

## 2016-09-01 DIAGNOSIS — Z5181 Encounter for therapeutic drug level monitoring: Secondary | ICD-10-CM | POA: Diagnosis not present

## 2016-09-01 DIAGNOSIS — I482 Chronic atrial fibrillation, unspecified: Secondary | ICD-10-CM

## 2016-09-01 DIAGNOSIS — I4891 Unspecified atrial fibrillation: Secondary | ICD-10-CM

## 2016-09-01 DIAGNOSIS — Z7901 Long term (current) use of anticoagulants: Secondary | ICD-10-CM

## 2016-09-01 LAB — POCT INR: INR: 2.8

## 2016-09-06 NOTE — Progress Notes (Deleted)
HPI: FU Atrial fibrillation, CAD, previous V fib arrest and ischemic CM. Also with PVD. Had PCI of LAD 2004. He was admitted in June of 2013 following a ventricular fibrillation arrest that was felt related to hypokalemia (K 2.2). Patient improved following that episode. Patient admitted in April of 2014 following a ventricular fibrillation arrest. Cardiac catheterization in April of 2014 showed a patent stent in the LAD with a 40% in-stent restenosis. There was an apical 80% lesion. The circumflex was occluded. The right coronary artery was small and nondominant. The patient had attempt at PCI of the occluded circumflex but was unsuccessful. Echocardiogram in April of 2014 showed an ejection fraction of 30-35%, mild left atrial enlargement, moderately reduced RV function and mild mitral regurgitation. Patient was seen by Dr. Lovena Le of electrophysiology and ICD felt not indicated at that time because the patient did suffer a non-ST elevation myocardial infarction with positive enzymes. He was discharged with a life vest. FU echo showed improvement in LV function. Abd ultrasound showed 2.7x2.9x3.2 AAA. Last echo 1/17 showed EF 35-40, mild AI, moderate MR, severe LAE. Nuclear study 1/17 showed inferior/apical fixed defect; no ischemia. Pt has seen Dr Gwenlyn Found for PVD (R ABI 0.54 and L 0.40); has occluded occluded right external iliac artery as well as left SFA both of which were not percutaneously addressable and medical therapy recommended. Carotid dopplers 10/17 showed . Since he was last seen,  Current Outpatient Prescriptions  Medication Sig Dispense Refill  . albuterol (PROVENTIL HFA;VENTOLIN HFA) 108 (90 Base) MCG/ACT inhaler Inhale into the lungs every 6 (six) hours as needed for wheezing or shortness of breath.    Marland Kitchen atorvastatin (LIPITOR) 80 MG tablet TAKE 1 TABLET (80 MG TOTAL) BY MOUTH DAILY. 30 tablet 4  . furosemide (LASIX) 20 MG tablet Take 1 tablet (20 mg total) by mouth daily. (Patient  taking differently: Take 20 mg by mouth at bedtime. ) 30 tablet 10  . hydrALAZINE (APRESOLINE) 10 MG tablet Take 1 tablet (10 mg total) by mouth 3 (three) times daily. 90 tablet 6  . ibuprofen (ADVIL,MOTRIN) 200 MG tablet Take 400 mg by mouth every 6 (six) hours as needed for headache.    . metoprolol succinate (TOPROL-XL) 25 MG 24 hr tablet TAKE 3 TABLETS (75 MG TOTAL) BY MOUTH DAILY. 90 tablet 9  . nitroGLYCERIN (NITROSTAT) 0.4 MG SL tablet Place 1 tablet (0.4 mg total) under the tongue every 5 (five) minutes as needed for chest pain (Up to 3 doses). 25 tablet 3  . potassium chloride SA (K-DUR,KLOR-CON) 20 MEQ tablet TAKE ONE TABLET BY MOUTH TWICE DAILY 60 tablet 4  . warfarin (COUMADIN) 2.5 MG tablet TAKE 1 TABLET BY MOUTH DAILY OR AS DIRECTED BY COUMADIN CLINIC 30 tablet 0   No current facility-administered medications for this visit.      Past Medical History:  Diagnosis Date  . AAA (abdominal aortic aneurysm) ([place])    a. 08/2015 Abd U/S: 2.7 x 2.9 x 3.2 cm infrarenal AAA.  . Bradycardia    a. Requiring discontinuation of use of BB/CCB  . Cardiac arrest - ventricular fibrillation    a. 03/2012 in setting of hypokalemia (prolonged hosp with VDRF, tracheobronchitis, ARF, shock liver, PAF, AMS felt secondary to post-anoxic encephalopathy/shock).  . Carotid artery stenosis    a. 01/2011 - 40-59% bilateral stenosis;  b. 06/2015 Carotid U/S: 40-59% bilat ICA stenosis->f/u 6 mos.  . CKD (chronic kidney disease), stage III   . Coronary artery disease  a. s/p aborted ant STEMI tx with Cypher DES to LAD 10/04 (residual at cath: D1 50%, CFX 40% and multiple dist 70%, EF 55%);   b. myoview 3/10: Ef 47%, infero-apical isch, LOW RISK - med Tx recommended;  c. 12/2012 VF Arrest/Cath: LAD 40isr, 80 apical, LCX 100 (failed PTCA), RCA nondom.  . Diabetes mellitus   . Diverticulosis    2001  . Diverticulosis 2001  . Elevated LFTs    Shock liver 03/2012  . H/O: CVA (cardiovascular accident)   .  Hematemesis    a. 12/2010 felt 2/2 Mallory Weiss tear - pt could not afford colonscopy/EGD at that time so Coumadin was deferred. Coumadin initiated 03/2012 without any evidence for bleeding.  Marland Kitchen History of Ischemic Cardiomyopathy    a. EF 50-55% by echo 03/09/12 (was 30% by echo 02/24/12); b. 03/2013 Echo: EF 55-60%, mild MR, sev dil LA, mod dil RA, PASP 45mmHg.  Marland Kitchen Hypertensive heart disease    a. echo 4/12: EF 50%, asymmetric septal hypertrophy, no SAM or LVOT gradient, LAE, PASP 35  . Inguinal hernia   . Inguinal hernia without mention of obstruction or gangrene, unilateral or unspecified, (not specified as recurrent)   . 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 Mitral regurgitation    a. Mild by echo 03/2012 and 03/2013.  . Mixed Hyperlipidemia   . Peripheral arterial disease ([place])   . Peripheral vascular disease ([place])    a. h/o LE angioplasty;  b. 08/2015 Duplex: >50% RCIA, 100% REIA, >50% LEIA, elev vel in SMA, patent IVC.  Marland Kitchen Persistent atrial fibrillation ([place])    a. Dx 12/2010-->coumadin (CHA2DS2VASc = 7).  . QT prolongation     Past Surgical History:  Procedure Laterality Date  . HERNIA REPAIR    . LEFT HEART CATH N/A 12/30/2012   Procedure: LEFT HEART CATH;  Surgeon: [name], MD;  Location[facility] C[facility] LAB;  Service: Cardiovascular;  Laterality: N/A;  . ORIF TIBIA & FIBULA FRACTURES  01/11/07   OPEN TX OF UNICONDYLAR PLATEAU FRACTURE, IRRIGATION/DEBRIDEMENT OF OPEN FRACTURE INCLUDING BONE, REMOVAL OF EXTERNAL FIXATOR UNDER ANESTHESIA PER DR. MICHAEL HANDY  . PERIPHERAL VASCULAR CATHETERIZATION N/A 12/21/2015   Procedure: Lower Extremity Angiography;  Surgeon: [name], MD;  Location: [facility] CV LAB;  Service: Cardiovascular;  Laterality: N/A;  . POPLITEAL ARTERY ANGIOPLASTY  01/07/07   s/p LEFT POPLITEAL ARTERY EXPLORATION AND VEIN PATCH ANGIOPLASTY, PER DR. EARLY, SECONDARY TO ISCHEMIC LEFT FOOT RELATED TO LEFT POPLITEAL ARTERY INJURY  . SKIN  GRAFT      Social History   Social History  . Marital status: Married    Spouse name: N/A  . Number of children: 4  . Years of education: N/A   Occupational History  . 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 Smoking status: Current Some Day Smoker    Packs/day: 0.50    Years: 30.00    Types: Cigarettes    Last attempt to quit: 03/12/2012  . Smokeless tobacco: Not on file  . Alcohol use No  . Drug use: No  . Sexual activity: Not on file   Other Topics Concern  . 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 YES. 1/2 -1 PPD OF CIGARETTES    NO ETOH    Family  History  Problem Relation Age of Onset  . Heart disease Father     ALSO UNCLE DIED HAD CAD  . Heart failure Father   . Brain cancer Mother   . Leukemia Mother   . Sickle cell anemia Mother   . Sickle cell anemia Other     ROS: no fevers or chills, productive cough, hemoptysis, dysphasia, odynophagia, melena, hematochezia, dysuria, hematuria, rash, seizure activity, orthopnea, PND, pedal edema, claudication. Remaining systems are negative.  Physical Exam: Well-developed well-nourished in no acute distress.  Skin is warm and dry.  HEENT is normal.  Neck is supple.  Chest is clear to auscultation with normal expansion.  Cardiovascular exam is regular rate and rhythm.  Abdominal exam nontender or distended. No masses palpated. Extremities show no edema. neuro grossly intact  ECG

## 2016-09-13 ENCOUNTER — Ambulatory Visit: Payer: Commercial Managed Care - HMO | Admitting: Cardiology

## 2016-09-21 ENCOUNTER — Other Ambulatory Visit: Payer: Self-pay | Admitting: Cardiology

## 2016-09-26 NOTE — Progress Notes (Signed)
HPI: FU Atrial fibrillation, CAD, previous V fib arrest and ischemic CM. Patient with a history of PCI of his LAD in October 2004. He was admitted in June of 2013 following a ventricular fibrillation arrest that was felt related to hypokalemia (K 2.2). Patient improved following that episode. Patient admitted in April of 2014 following a ventricular fibrillation arrest. Cardiac catheterization in April of 2014 showed a patent stent in the LAD with a 40% in-stent restenosis. There was an apical 80% lesion. The circumflex was occluded. The right coronary artery was small and nondominant. The patient had attempt at PCI of the occluded circumflex but was unsuccessful. Echocardiogram in April of 2014 showed an ejection fraction of 30-35%, mild left atrial enlargement, moderately reduced RV function and mild mitral regurgitation. Patient was seen by Dr. Lovena Le of electrophysiology and ICD felt not indicated at that time because the patient did suffer a non-ST elevation myocardial infarction with positive enzymes. He was discharged with a life vest. Abdominal ultrasound December 2016 showed 2.7 x 2.9 abdominal aortic aneurysm with peripheral vascular disease. Nuclear study January 2017 showed fixed inferior and apical defect but no ischemia. Echocardiogram January 2017 showed ejection fraction 35-40%, mild aortic insufficiency, moderate mitral regurgitation and severe left atrial enlargement. Patient had angiogram for peripheral vascular disease in April 2017 that showed an occluded right external iliac and common femoral artery not amenable to percutaneous intervention and an 80% left common femoral artery stenosis and an occluded left SFA also not amenable to percutaneous intervention. Treated medically. Carotid Dopplers October 2017 showed 40-59% bilateral stenosis. Since he was last seen, patient notes increased dyspnea. He describes a cough productive of yellow sputum. He denies orthopnea or PND. Mild pedal  edema. No chest pain, palpitations or syncope.  Current Outpatient Prescriptions  Medication Sig Dispense Refill  . albuterol (PROVENTIL HFA;VENTOLIN HFA) 108 (90 Base) MCG/ACT inhaler Inhale into the lungs every 6 (six) hours as needed for wheezing or shortness of breath.    Marland Kitchen atorvastatin (LIPITOR) 80 MG tablet TAKE 1 TABLET (80 MG TOTAL) BY MOUTH DAILY. 30 tablet 4  . furosemide (LASIX) 20 MG tablet Take 1 tablet (20 mg total) by mouth daily. (Patient taking differently: Take 20 mg by mouth at bedtime. ) 30 tablet 10  . hydrALAZINE (APRESOLINE) 10 MG tablet Take 1 tablet (10 mg total) by mouth 3 (three) times daily. 90 tablet 6  . ibuprofen (ADVIL,MOTRIN) 200 MG tablet Take 400 mg by mouth every 6 (six) hours as needed for headache.    . metoprolol succinate (TOPROL-XL) 25 MG 24 hr tablet TAKE 3 TABLETS (75 MG TOTAL) BY MOUTH DAILY. 90 tablet 9  . nitroGLYCERIN (NITROSTAT) 0.4 MG SL tablet Place 1 tablet (0.4 mg total) under the tongue every 5 (five) minutes as needed for chest pain (Up to 3 doses). 25 tablet 3  . potassium chloride SA (K-DUR,KLOR-CON) 20 MEQ tablet TAKE ONE TABLET BY MOUTH TWICE DAILY 60 tablet 4  . warfarin (COUMADIN) 2.5 MG tablet TAKE 1 TABLET BY MOUTH DAILY OR AS DIRECTED BY COUMADIN CLINIC 30 tablet 0   No current facility-administered medications for this visit.      Past Medical History:  Diagnosis Date  . AAA (abdominal aortic aneurysm) ([place])    a. 08/2015 Abd U/S: 2.7 x 2.9 x 3.2 cm infrarenal AAA.  . Bradycardia    a. Requiring discontinuation of use of BB/CCB  . Cardiac arrest - ventricular fibrillation    a. 03/2012 in  setting of hypokalemia (prolonged hosp with VDRF, tracheobronchitis, ARF, shock liver, PAF, AMS felt secondary to post-anoxic encephalopathy/shock).  . Carotid artery stenosis    a. 01/2011 - 40-59% bilateral stenosis;  b. 06/2015 Carotid U/S: 40-59% bilat ICA stenosis->f/u 6 mos.  . CKD (chronic kidney disease), stage III   . Coronary artery  disease    a. s/p aborted ant STEMI tx with Cypher DES to LAD 10/04 (residual at cath: D1 50%, CFX 40% and multiple dist 70%, EF 55%);   b. myoview 3/10: Ef 47%, infero-apical isch, LOW RISK - med Tx recommended;  c. 12/2012 VF Arrest/Cath: LAD 40isr, 80 apical, LCX 100 (failed PTCA), RCA nondom.  . Diabetes mellitus   . Diverticulosis    2001  . Diverticulosis 2001  . Elevated LFTs    Shock liver 03/2012  . H/O: CVA (cardiovascular accident)   . Hematemesis    a. 12/2010 felt 2/2 Mallory Weiss tear - pt could not afford colonscopy/EGD at that time so Coumadin was deferred. Coumadin initiated 03/2012 without any evidence for bleeding.  Marland Kitchen History of Ischemic Cardiomyopathy    a. EF 50-55% by echo 03/09/12 (was 30% by echo 02/24/12); b. 03/2013 Echo: EF 55-60%, mild MR, sev dil LA, mod dil RA, PASP 28mmHg.  Marland Kitchen Hypertensive heart disease    a. echo 4/12: EF 50%, asymmetric septal hypertrophy, no SAM or LVOT gradient, LAE, PASP 35  . Inguinal hernia   . Inguinal hernia without mention of obstruction or gangrene, unilateral or unspecified, (not specified as recurrent)   . 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 Mitral regurgitation    a. Mild by echo 03/2012 and 03/2013.  . Mixed Hyperlipidemia   . Peripheral arterial disease ([place])   . Peripheral vascular disease ([place])    a. h/o LE angioplasty;  b. 08/2015 Duplex: >50% RCIA, 100% REIA, >50% LEIA, elev vel in SMA, patent IVC.  Marland Kitchen Persistent atrial fibrillation ([place])    a. Dx 12/2010-->coumadin (CHA2DS2VASc = 7).  . QT prolongation     Past Surgical History:  Procedure Laterality Date  . HERNIA REPAIR    . LEFT HEART CATH N/A 12/30/2012   Procedure: LEFT HEART CATH;  Surgeon: [name], MD;  Location: [facility] CATH LAB;  Service: Cardiovascular;  Laterality: N/A;  . ORIF TIBIA & FIBULA FRACTURES  01/11/07   OPEN TX OF UNICONDYLAR PLATEAU FRACTURE, IRRIGATION/DEBRIDEMENT OF OPEN FRACTURE INCLUDING BONE, REMOVAL OF  EXTERNAL FIXATOR UNDER ANESTHESIA PER DR. MICHAEL HANDY  . PERIPHERAL VASCULAR CATHETERIZATION N/A 12/21/2015   Procedure: Lower Extremity Angiography;  Surgeon: [name], MD;  Location: [facility] CV LAB;  Service: Cardiovascular;  Laterality: N/A;  . POPLITEAL ARTERY ANGIOPLASTY  01/07/07   s/p LEFT POPLITEAL ARTERY EXPLORATION AND VEIN PATCH ANGIOPLASTY, PER DR. EARLY, SECONDARY TO ISCHEMIC LEFT FOOT RELATED TO LEFT POPLITEAL ARTERY INJURY  . SKIN GRAFT      Social History   Social History  . Marital status: Married    Spouse name: N/A  . Number of children: 4  . Years of education: N/A   Occupational History  . 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 Smoking status: Current Some Day Smoker    Packs/day: 0.50    Years: 30.00    Types: Cigarettes    Last attempt to quit: 03/12/2012  . Smokeless tobacco: Never Used  . Alcohol use No  . Drug use: No  . Sexual activity: Not  on file   Other Topics Concern  . 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 YES. 1/2 -1 PPD OF CIGARETTES    NO ETOH    Family History  Problem Relation Age of Onset  . Heart disease Father     ALSO UNCLE DIED HAD CAD  . Heart failure Father   . Brain cancer Mother   . Leukemia Mother   . Sickle cell anemia Mother   . Sickle cell anemia Other     ROS: Productive cough but no fevers or chills, hemoptysis, dysphasia, odynophagia, melena, hematochezia, dysuria, hematuria, rash, seizure activity, orthopnea, PND, claudication. Remaining systems are negative.  Physical Exam: Well-developed well-nourished in no acute distress.  Skin is warm and dry.  HEENT is normal.  Neck is supple.  Chest with diminished BS Cardiovascular exam is irregular Abdominal exam nontender or distended. No masses palpated. Extremities show trace edema. neuro grossly  intact  A/P  1 Permanent atrial fibrillation-continue beta blocker for rate control. Continue Coumadin. Check Hgb.  2 coronary artery disease-continue statin. Not on aspirin given need for anticoagulation.  3. Carotid artery disease-continue statin. Plan follow-up carotid Dopplers October 2018.  4 hyperlipidemia-continue statin. Check lipids and liver.  5 hypertension-blood pressure elevated. Continue present medications. Add cozaar 25 mg daily; bmet one week.  6 tobacco abuse-patient counseled on discontinuing.  7 ischemic cardiomyopathy-continue beta blocker. Add cozaar 25 mg daily; bmet one week.  8 Abdominal aortic aneurysm-schedule follow-up ultrasound.  9 PVD-continue statin.   10 possible bronchitis-I have given him a prescription for azithromycin. We will also refer to primary care.   11 Chronic stage III kidney disease-check renal function with addition of Cozaar.   Kirk Ruths, MD

## 2016-10-06 ENCOUNTER — Ambulatory Visit (INDEPENDENT_AMBULATORY_CARE_PROVIDER_SITE_OTHER): Payer: Medicare HMO | Admitting: Cardiology

## 2016-10-06 ENCOUNTER — Encounter: Payer: Self-pay | Admitting: Cardiology

## 2016-10-06 VITALS — BP 156/68 | HR 91 | Ht 75.0 in | Wt 187.1 lb

## 2016-10-06 DIAGNOSIS — I714 Abdominal aortic aneurysm, without rupture, unspecified: Secondary | ICD-10-CM

## 2016-10-06 DIAGNOSIS — I4891 Unspecified atrial fibrillation: Secondary | ICD-10-CM | POA: Diagnosis not present

## 2016-10-06 DIAGNOSIS — J40 Bronchitis, not specified as acute or chronic: Secondary | ICD-10-CM

## 2016-10-06 DIAGNOSIS — I428 Other cardiomyopathies: Secondary | ICD-10-CM | POA: Diagnosis not present

## 2016-10-06 DIAGNOSIS — E7800 Pure hypercholesterolemia, unspecified: Secondary | ICD-10-CM | POA: Diagnosis not present

## 2016-10-06 MED ORDER — AZITHROMYCIN 250 MG PO TABS: ORAL_TABLET | ORAL | 0 refills | Status: DC

## 2016-10-06 MED ORDER — LOSARTAN POTASSIUM 25 MG PO TABS: 25.0000 mg | ORAL_TABLET | Freq: Every day | ORAL | 3 refills | Status: DC

## 2016-10-06 NOTE — Patient Instructions (Signed)
Medication Instructions:   Z-PAK TAKE AS DIRECTED  START LOSARTAN 25 MG ONCE DAILY  Labwork:  Your physician recommends that you return for lab work in: [place]- DO NOT EAT PRIOR TO LAB WORK  Testing/Procedures:  Your physician has requested that you have an abdominal aorta duplex. During this test, an ultrasound is used to evaluate the aorta. Allow 30 minutes for this exam. Do not eat after midnight the day before and avoid carbonated beverages   Follow-Up:  Your physician recommends that you schedule a follow-up appointment in: [place]

## 2016-10-12 ENCOUNTER — Other Ambulatory Visit: Payer: Self-pay | Admitting: Cardiology

## 2016-10-12 NOTE — Telephone Encounter (Signed)
Pt overdue to INR check called to make appt. Appt made for Friday. He does not have enough Coumadin will send supply.

## 2016-10-14 ENCOUNTER — Ambulatory Visit (INDEPENDENT_AMBULATORY_CARE_PROVIDER_SITE_OTHER): Payer: Medicare HMO | Admitting: Pharmacist

## 2016-10-14 DIAGNOSIS — I482 Chronic atrial fibrillation, unspecified: Secondary | ICD-10-CM

## 2016-10-14 DIAGNOSIS — I4891 Unspecified atrial fibrillation: Secondary | ICD-10-CM

## 2016-10-14 DIAGNOSIS — Z7901 Long term (current) use of anticoagulants: Secondary | ICD-10-CM | POA: Diagnosis not present

## 2016-10-14 DIAGNOSIS — Z5181 Encounter for therapeutic drug level monitoring: Secondary | ICD-10-CM

## 2016-10-14 LAB — POCT INR: INR: 1.3

## 2016-10-17 ENCOUNTER — Telehealth: Payer: Self-pay | Admitting: Cardiology

## 2016-10-17 NOTE — Telephone Encounter (Signed)
Left message for patient, dr [name] will not fill inhalers.

## 2016-10-17 NOTE — Telephone Encounter (Signed)
matPatient notified that this is non-cardiac medication refill request  Spoke with patient and wife and they do not know who Rx'ed original inhaler Advised this should come from PCP but he does not see them until May 9 Advised will check with Dr. [name] but uncertain if it will be refilled as he generally does not Rx non-cardiac medications Voiced understanding.

## 2016-10-17 NOTE — Telephone Encounter (Signed)
New Message     *STAT* If patient is at the pharmacy, call can be transferred to refill team.   1. Which medications need to be refilled? (please list name of each medication and dose if known) albuterol (PROVENTIL HFA;VENTOLIN HFA) 108 (90 Base) MCG/ACT inhaler  2. Which pharmacy/location (including street and city if local pharmacy) is medication to be sent to? [facility] [place] [place] [facility] [address]  3. Do they need a 30 day or 90 day supply? 90 day

## 2016-10-26 ENCOUNTER — Telehealth: Payer: Self-pay | Admitting: *Deleted

## 2016-10-26 ENCOUNTER — Ambulatory Visit (HOSPITAL_COMMUNITY)
Admission: RE | Admit: 2016-10-26 | Discharge: 2016-10-26 | Disposition: A | Discharge status: Home / Self Care | Payer: Medicare HMO | Source: Ambulatory Visit | Attending: Cardiology | Admitting: Cardiology

## 2016-10-26 DIAGNOSIS — I714 Abdominal aortic aneurysm, without rupture, unspecified: Secondary | ICD-10-CM

## 2016-10-26 DIAGNOSIS — I739 Peripheral vascular disease, unspecified: Secondary | ICD-10-CM | POA: Diagnosis not present

## 2016-10-26 DIAGNOSIS — R0602 Shortness of breath: Secondary | ICD-10-CM

## 2016-10-26 MED ORDER — FUROSEMIDE 20 MG PO TABS: 40.0000 mg | ORAL_TABLET | Freq: Every day | ORAL | 3 refills | Status: DC

## 2016-10-26 NOTE — Telephone Encounter (Signed)
Patient in the office today for testing, he reports SOB and edema in both feet and ankles. Per dr [name], the patient will increase furosemide to 40 mg bid x 2 days and then will take furosemide 40 mg daily. New script sent to the pharmacy. He will have a BMP in one week, paperwork given to the patient for labs. Follow up scheduled with dr [name]. Patient instructed to contact the office Friday if no change in symptoms.

## 2016-10-28 ENCOUNTER — Encounter: Payer: Self-pay | Admitting: Cardiology

## 2016-10-31 DIAGNOSIS — R0602 Shortness of breath: Secondary | ICD-10-CM | POA: Diagnosis not present

## 2016-10-31 LAB — BASIC METABOLIC PANEL
BUN: 17 mg/dL (ref 7–25)
CALCIUM: 8.5 mg/dL — AB (ref 8.6–10.3)
CO2: 27 mmol/L (ref 20–31)
CREATININE: 1.34 mg/dL — AB (ref 0.70–1.18)
Chloride: 103 mmol/L (ref 98–110)
GLUCOSE: 123 mg/dL — AB (ref 65–99)
POTASSIUM: 3.8 mmol/L (ref 3.5–5.3)
SODIUM: 139 mmol/L (ref 135–146)

## 2016-11-02 NOTE — Progress Notes (Signed)
HPI: FU Atrial fibrillation, CAD, previous V fib arrest and ischemic CM. Patient with a history of PCI of his LAD in October 2004. He was admitted in June of 2013 following a ventricular fibrillation arrest that was felt related to hypokalemia (K 2.2). Patient improved following that episode. Patient admitted in April of 2014 following a ventricular fibrillation arrest. Cardiac catheterization in April of 2014 showed a patent stent in the LAD with a 40% in-stent restenosis. There was an apical 80% lesion. The circumflex was occluded. The right coronary artery was small and nondominant. The patient had attempt at PCI of the occluded circumflex but was unsuccessful. Echocardiogram in April of 2014 showed an ejection fraction of 30-35%, mild left atrial enlargement, moderately reduced RV function and mild mitral regurgitation. Patient was seen by Dr. Lovena Le of electrophysiology and ICD felt not indicated at that time because the patient did suffer a non-ST elevation myocardial infarction with positive enzymes. He was discharged with a life vest. Nuclear study January 2017 showed fixed inferior and apical defect but no ischemia. Echocardiogram January 2017 showed ejection fraction 35-40%, mild aortic insufficiency, moderate mitral regurgitation and severe left atrial enlargement. Patient had angiogram for peripheral vascular disease in April 2017 that showed an occluded right external iliac and common femoral artery not amenable to percutaneous intervention and an 80% left common femoral artery stenosis and an occluded left SFA also not amenable to percutaneous intervention. Treated medically. Carotid Dopplers October 2017 showed 40-59% bilateral stenosis. Abdominal ultrasound February 2018 showed 2.7 x 2.9 abdominal aortic aneurysm and greater than 50% bilateral iliac stenosis. At time of recent abdominal ultrasound patient stopped and told us he was having increased pedal edema. We increased his Lasix.   Since he was last seen, his dyspnea has improved. No orthopnea or PND. No chest pain.  Current Outpatient Prescriptions  Medication Sig Dispense Refill  . albuterol (PROVENTIL HFA;VENTOLIN HFA) 108 (90 Base) MCG/ACT inhaler Inhale into the lungs every 6 (six) hours as needed for wheezing or shortness of breath.    Marland Kitchen atorvastatin (LIPITOR) 80 MG tablet TAKE 1 TABLET (80 MG TOTAL) BY MOUTH DAILY. 30 tablet 4  . azithromycin (ZITHROMAX Z-PAK) 250 MG tablet Take as directed 6 each 0  . furosemide (LASIX) 20 MG tablet Take 2 tablets (40 mg total) by mouth daily. 160 tablet 3  . hydrALAZINE (APRESOLINE) 10 MG tablet Take 1 tablet (10 mg total) by mouth 3 (three) times daily. 90 tablet 6  . ibuprofen (ADVIL,MOTRIN) 200 MG tablet Take 400 mg by mouth every 6 (six) hours as needed for headache.    . losartan (COZAAR) 25 MG tablet Take 1 tablet (25 mg total) by mouth daily. 90 tablet 3  . metoprolol succinate (TOPROL-XL) 25 MG 24 hr tablet TAKE 3 TABLETS (75 MG TOTAL) BY MOUTH DAILY. 90 tablet 9  . nitroGLYCERIN (NITROSTAT) 0.4 MG SL tablet Place 1 tablet (0.4 mg total) under the tongue every 5 (five) minutes as needed for chest pain (Up to 3 doses). 25 tablet 3  . potassium chloride SA (K-DUR,KLOR-CON) 20 MEQ tablet TAKE ONE TABLET BY MOUTH TWICE DAILY 60 tablet 4  . warfarin (COUMADIN) 2.5 MG tablet TAKE 1 TABLET BY MOUTH DAILY OR AS DIRECTED BY COUMADIN CLINIC 30 tablet 0   No current facility-administered medications for this visit.      Past Medical History:  Diagnosis Date  . AAA (abdominal aortic aneurysm) ([place])    a. 08/2015 Abd U/S: 2.7 x  2.9 x 3.2 cm infrarenal AAA.  . Bradycardia    a. Requiring discontinuation of use of BB/CCB  . Cardiac arrest - ventricular fibrillation    a. 03/2012 in setting of hypokalemia (prolonged hosp with VDRF, tracheobronchitis, ARF, shock liver, PAF, AMS felt secondary to post-anoxic encephalopathy/shock).  . Carotid artery stenosis    a. 01/2011 - 40-59%  bilateral stenosis;  b. 06/2015 Carotid U/S: 40-59% bilat ICA stenosis->f/u 6 mos.  . CKD (chronic kidney disease), stage III   . Coronary artery disease    a. s/p aborted ant STEMI tx with Cypher DES to LAD 10/04 (residual at cath: D1 50%, CFX 40% and multiple dist 70%, EF 55%);   b. myoview 3/10: Ef 47%, infero-apical isch, LOW RISK - med Tx recommended;  c. 12/2012 VF Arrest/Cath: LAD 40isr, 80 apical, LCX 100 (failed PTCA), RCA nondom.  . Diabetes mellitus   . Diverticulosis    2001  . Diverticulosis 2001  . Elevated LFTs    Shock liver 03/2012  . H/O: CVA (cardiovascular accident)   . Hematemesis    a. 12/2010 felt 2/2 Mallory Weiss tear - pt could not afford colonscopy/EGD at that time so Coumadin was deferred. Coumadin initiated 03/2012 without any evidence for bleeding.  Marland Kitchen History of Ischemic Cardiomyopathy    a. EF 50-55% by echo 03/09/12 (was 30% by echo 02/24/12); b. 03/2013 Echo: EF 55-60%, mild MR, sev dil LA, mod dil RA, PASP 59mmHg.  Marland Kitchen Hypertensive heart disease    a. echo 4/12: EF 50%, asymmetric septal hypertrophy, no SAM or LVOT gradient, LAE, PASP 35  . Inguinal hernia   . Inguinal hernia without mention of obstruction or gangrene, unilateral or unspecified, (not specified as recurrent)   . 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 Mitral regurgitation    a. Mild by echo 03/2012 and 03/2013.  . Mixed Hyperlipidemia   . Peripheral arterial disease ([place])   . Peripheral vascular disease ([place])    a. h/o LE angioplasty;  b. 08/2015 Duplex: >50% RCIA, 100% REIA, >50% LEIA, elev vel in SMA, patent IVC.  Marland Kitchen Persistent atrial fibrillation ([place])    a. Dx 12/2010-->coumadin (CHA2DS2VASc = 7).  . QT prolongation     Past Surgical History:  Procedure Laterality Date  . HERNIA REPAIR    . LEFT HEART CATH N/A 12/30/2012   Procedure: LEFT HEART CATH;  Surgeon: [name], MD;  Location: [facility] CATH LAB;  Service: Cardiovascular;  Laterality: N/A;  . ORIF  TIBIA & FIBULA FRACTURES  01/11/07   OPEN TX OF UNICONDYLAR PLATEAU FRACTURE, IRRIGATION/DEBRIDEMENT OF OPEN FRACTURE INCLUDING BONE, REMOVAL OF EXTERNAL FIXATOR UNDER ANESTHESIA PER DR. MICHAEL HANDY  . PERIPHERAL VASCULAR CATHETERIZATION N/A 12/21/2015   Procedure: Lower Extremity Angiography;  Surgeon: [name], MD;  Location: [facility] CV LAB;  Service: Cardiovascular;  Laterality: N/A;  . POPLITEAL ARTERY ANGIOPLASTY  01/07/07   s/p LEFT POPLITEAL ARTERY EXPLORATION AND VEIN PATCH ANGIOPLASTY, PER DR. EARLY, SECONDARY TO ISCHEMIC LEFT FOOT RELATED TO LEFT POPLITEAL ARTERY INJURY  . SKIN GRAFT      Social History   Social History  . Marital status: Married    Spouse name: N/A  . Number of children: 4  . Years of education: N/A   Occupational History  . 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 Smoking status: Current Some Day Smoker    Packs/day: 0.50    Years: 30.00  Types: Cigarettes    Last attempt to quit: 03/12/2012  . Smokeless tobacco: Never Used  . Alcohol use No  . Drug use: No  . Sexual activity: Not on file   Other Topics Concern  . 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 YES. 1/2 -1 PPD OF CIGARETTES    NO ETOH    Family History  Problem Relation Age of Onset  . Heart disease Father     ALSO UNCLE DIED HAD CAD  . Heart failure Father   . Brain cancer Mother   . Leukemia Mother   . Sickle cell anemia Mother   . Sickle cell anemia Other     ROS: Bilateral foot pain at night but no fevers or chills, productive cough, hemoptysis, dysphasia, odynophagia, melena, hematochezia, dysuria, hematuria, rash, seizure activity, orthopnea, PND, pedal edema, claudication. Remaining systems are negative.  Physical Exam: Well-developed well-nourished in no acute distress.  Skin is warm and dry.  HEENT is normal.  Neck is  supple.  Chest is clear to auscultation with normal expansion.  Cardiovascular exam is regular rate and rhythm.  Abdominal exam nontender or distended. No masses palpated. Extremities show no edema. neuro grossly intact  A/P  1 Permanent atrial fibrillation-continue beta blocker for rate control. Continue Coumadin.  2 Chronic combined systolic/diastolic congestive heart failure-volume status has improved since increasing lasix. Continue present dose of diuretic. Repeat potassium and renal function in 2 weeks. Repeat echocardiogram.  3 coronary artery disease-continue statin. No aspirin given need for Coumadin.  4 carotid artery disease-plan follow-up carotid Dopplers October 2018.  5 hypertension-blood pressure controlled. Continue new present medications.  6 tobacco abuse-patient counseled on discontinuing.  7 ischemic cardiomyopathy-continue beta blocker and ARB.  8 abdominal aortic aneurysm-plan follow-up ultrasound February 2019.  9 vascular disease-continue statin.  10 chronic stage III kidney disease  Kirk Ruths, MD

## 2016-11-03 ENCOUNTER — Ambulatory Visit (INDEPENDENT_AMBULATORY_CARE_PROVIDER_SITE_OTHER): Payer: Medicare HMO | Admitting: Pharmacist

## 2016-11-03 DIAGNOSIS — I4891 Unspecified atrial fibrillation: Secondary | ICD-10-CM

## 2016-11-03 DIAGNOSIS — I482 Chronic atrial fibrillation, unspecified: Secondary | ICD-10-CM

## 2016-11-03 DIAGNOSIS — Z5181 Encounter for therapeutic drug level monitoring: Secondary | ICD-10-CM

## 2016-11-03 DIAGNOSIS — Z7901 Long term (current) use of anticoagulants: Secondary | ICD-10-CM

## 2016-11-03 LAB — POCT INR: INR: 2.5

## 2016-11-07 ENCOUNTER — Ambulatory Visit (INDEPENDENT_AMBULATORY_CARE_PROVIDER_SITE_OTHER): Payer: Medicare HMO | Admitting: Cardiology

## 2016-11-07 ENCOUNTER — Encounter: Payer: Self-pay | Admitting: Cardiology

## 2016-11-07 VITALS — BP 112/60 | HR 61 | Ht 72.5 in | Wt 177.4 lb

## 2016-11-07 DIAGNOSIS — I2510 Atherosclerotic heart disease of native coronary artery without angina pectoris: Secondary | ICD-10-CM

## 2016-11-07 DIAGNOSIS — I509 Heart failure, unspecified: Secondary | ICD-10-CM

## 2016-11-07 NOTE — Patient Instructions (Addendum)
Medication Instructions:   NO CHANGE  Labwork:  Your physician recommends that you return for lab work in:2 WEEKS  Testing/Procedures:  Your physician has requested that you have an echocardiogram. Echocardiography is a painless test that uses sound waves to create images of your heart. It provides your doctor with information about the size and shape of your heart and how well your heart's chambers and valves are working. This procedure takes approximately one hour. There are no restrictions for this procedure.    Follow-Up:  Your physician recommends that you schedule a follow-up appointment in: [place]   If you need a refill on your cardiac medications before your next appointment, please call your pharmacy.

## 2016-11-11 ENCOUNTER — Ambulatory Visit: Payer: Medicare HMO | Admitting: Family

## 2016-11-23 ENCOUNTER — Ambulatory Visit (HOSPITAL_COMMUNITY): Payer: Medicare HMO | Attending: Internal Medicine

## 2016-11-23 ENCOUNTER — Other Ambulatory Visit: Payer: Self-pay

## 2016-11-23 DIAGNOSIS — I509 Heart failure, unspecified: Secondary | ICD-10-CM | POA: Diagnosis not present

## 2016-11-23 DIAGNOSIS — I340 Nonrheumatic mitral (valve) insufficiency: Secondary | ICD-10-CM | POA: Insufficient documentation

## 2016-12-01 ENCOUNTER — Ambulatory Visit (INDEPENDENT_AMBULATORY_CARE_PROVIDER_SITE_OTHER): Payer: Medicare HMO | Admitting: Pharmacist

## 2016-12-01 DIAGNOSIS — Z5181 Encounter for therapeutic drug level monitoring: Secondary | ICD-10-CM

## 2016-12-01 DIAGNOSIS — Z7901 Long term (current) use of anticoagulants: Secondary | ICD-10-CM | POA: Diagnosis not present

## 2016-12-01 DIAGNOSIS — I482 Chronic atrial fibrillation, unspecified: Secondary | ICD-10-CM

## 2016-12-01 DIAGNOSIS — I4891 Unspecified atrial fibrillation: Secondary | ICD-10-CM

## 2016-12-01 LAB — POCT INR: INR: 3.7

## 2016-12-07 ENCOUNTER — Encounter: Payer: Self-pay | Admitting: *Deleted

## 2016-12-14 ENCOUNTER — Other Ambulatory Visit: Payer: Self-pay | Admitting: Cardiology

## 2016-12-15 ENCOUNTER — Other Ambulatory Visit: Payer: Self-pay | Admitting: Cardiology

## 2016-12-15 NOTE — Telephone Encounter (Signed)
New Message ° ° pt verbalized that she is returning call for rn °

## 2016-12-16 ENCOUNTER — Telehealth: Payer: Self-pay | Admitting: *Deleted

## 2016-12-16 NOTE — Telephone Encounter (Signed)
PA #[id number] for metoprolol started.

## 2016-12-22 ENCOUNTER — Ambulatory Visit (INDEPENDENT_AMBULATORY_CARE_PROVIDER_SITE_OTHER): Payer: Medicare HMO | Admitting: Pharmacist

## 2016-12-22 DIAGNOSIS — I482 Chronic atrial fibrillation, unspecified: Secondary | ICD-10-CM

## 2016-12-22 DIAGNOSIS — I4891 Unspecified atrial fibrillation: Secondary | ICD-10-CM | POA: Diagnosis not present

## 2016-12-22 DIAGNOSIS — Z5181 Encounter for therapeutic drug level monitoring: Secondary | ICD-10-CM | POA: Diagnosis not present

## 2016-12-22 DIAGNOSIS — Z7901 Long term (current) use of anticoagulants: Secondary | ICD-10-CM | POA: Diagnosis not present

## 2016-12-22 LAB — POCT INR: INR: 2.5

## 2016-12-23 NOTE — Telephone Encounter (Signed)
PA approved for metoprolol.

## 2017-01-05 ENCOUNTER — Ambulatory Visit: Payer: Medicare HMO | Admitting: Cardiology

## 2017-01-14 ENCOUNTER — Other Ambulatory Visit: Payer: Self-pay | Admitting: Cardiology

## 2017-01-15 ENCOUNTER — Other Ambulatory Visit: Payer: Self-pay | Admitting: Cardiology

## 2017-01-16 NOTE — Telephone Encounter (Signed)
Rx(s) sent to pharmacy electronically.  

## 2017-02-14 ENCOUNTER — Other Ambulatory Visit: Payer: Self-pay | Admitting: Cardiology

## 2017-02-20 NOTE — Progress Notes (Signed)
HPI: FU Atrial fibrillation, CAD, previous V fib arrest and ischemic CM. Patient with a history of PCI of his LAD in October 2004. He was admitted in June of 2013 following a ventricular fibrillation arrest that was felt related to hypokalemia (K 2.2). Patient improved following that episode. Patient admitted in April of 2014 following a ventricular fibrillation arrest. Cardiac catheterization in April of 2014 showed a patent stent in the LAD with a 40% in-stent restenosis. There was an apical 80% lesion. The circumflex was occluded. The right coronary artery was small and nondominant. The patient had attempt at PCI of the occluded circumflex but was unsuccessful. Echocardiogram in April of 2014 showed an ejection fraction of 30-35%, mild left atrial enlargement, moderately reduced RV function and mild mitral regurgitation. Patient was seen by Dr. Lovena Le of electrophysiology and ICD felt not indicated at that time because the patient did suffer a non-ST elevation myocardial infarction with positive enzymes. He was discharged with a life vest. Nuclear study January 2017 showed fixed inferior and apical defect but no ischemia. Patient had angiogram for peripheral vascular disease in April 2017 that showed an occluded right external iliac and common femoral artery not amenable to percutaneous intervention and an 80% left common femoral artery stenosis and an occluded left SFA also not amenable to percutaneous intervention. Treated medically.Carotid Dopplers October 2017 showed 40-59% bilateral stenosis. Abdominal ultrasound February 2018 showed 2.7 x 2.9 abdominal aortic aneurysm and greater than 50% bilateral iliac stenosis. Echocardiogram repeated March 2018 and showed ejection fraction 30-35%, moderate left ventricular enlargement, moderate aortic insufficiency, severe mitral regurgitation, biatrial enlargement, mild right ventricular enlargement, mild tricuspid regurgitation. Since last seen, patient  denies dyspnea, chest pain, palpitations or syncope.  Current Outpatient Prescriptions  Medication Sig Dispense Refill  . albuterol (PROVENTIL HFA;VENTOLIN HFA) 108 (90 Base) MCG/ACT inhaler Inhale into the lungs every 6 (six) hours as needed for wheezing or shortness of breath.    Marland Kitchen atorvastatin (LIPITOR) 80 MG tablet TAKE 1 TABLET (80 MG TOTAL) BY MOUTH DAILY. 90 tablet 3  . azithromycin (ZITHROMAX Z-PAK) 250 MG tablet Take as directed 6 each 0  . furosemide (LASIX) 20 MG tablet Take 2 tablets (40 mg total) by mouth daily. 160 tablet 3  . hydrALAZINE (APRESOLINE) 10 MG tablet Take 1 tablet (10 mg total) by mouth 3 (three) times daily. 90 tablet 6  . ibuprofen (ADVIL,MOTRIN) 200 MG tablet Take 400 mg by mouth every 6 (six) hours as needed for headache.    Marland Kitchen KLOR-CON M20 20 MEQ tablet TAKE ONE TABLET BY MOUTH TWICE DAILY 60 tablet 11  . metoprolol succinate (TOPROL-XL) 50 MG 24 hr tablet Take 3 tablets (150 mg total) by mouth daily. 90 tablet 8  . nitroGLYCERIN (NITROSTAT) 0.4 MG SL tablet Place 1 tablet (0.4 mg total) under the tongue every 5 (five) minutes as needed for chest pain (Up to 3 doses). 25 tablet 3  . warfarin (COUMADIN) 2.5 MG tablet TAKE 1 TABLET BY MOUTH DAILY OR AS DIRECTED BY COUMADIN CLINIC 30 tablet 0  . losartan (COZAAR) 25 MG tablet Take 1 tablet (25 mg total) by mouth daily. 90 tablet 3   No current facility-administered medications for this visit.      Past Medical History:  Diagnosis Date  . AAA (abdominal aortic aneurysm) ([place])    a. 08/2015 Abd U/S: 2.7 x 2.9 x 3.2 cm infrarenal AAA.  . Bradycardia    a. Requiring discontinuation of use of BB/CCB  .  Cardiac arrest - ventricular fibrillation    a. 03/2012 in setting of hypokalemia (prolonged hosp with VDRF, tracheobronchitis, ARF, shock liver, PAF, AMS felt secondary to post-anoxic encephalopathy/shock).  . Carotid artery stenosis    a. 01/2011 - 40-59% bilateral stenosis;  b. 06/2015 Carotid U/S: 40-59% bilat ICA  stenosis->f/u 6 mos.  . CKD (chronic kidney disease), stage III   . Coronary artery disease    a. s/p aborted ant STEMI tx with Cypher DES to LAD 10/04 (residual at cath: D1 50%, CFX 40% and multiple dist 70%, EF 55%);   b. myoview 3/10: Ef 47%, infero-apical isch, LOW RISK - med Tx recommended;  c. 12/2012 VF Arrest/Cath: LAD 40isr, 80 apical, LCX 100 (failed PTCA), RCA nondom.  . Diabetes mellitus   . Diverticulosis    2001  . Diverticulosis 2001  . Elevated LFTs    Shock liver 03/2012  . H/O: CVA (cardiovascular accident)   . Hematemesis    a. 12/2010 felt 2/2 Mallory Weiss tear - pt could not afford colonscopy/EGD at that time so Coumadin was deferred. Coumadin initiated 03/2012 without any evidence for bleeding.  Marland Kitchen History of Ischemic Cardiomyopathy    a. EF 50-55% by echo 03/09/12 (was 30% by echo 02/24/12); b. 03/2013 Echo: EF 55-60%, mild MR, sev dil LA, mod dil RA, PASP 81mmHg.  Marland Kitchen Hypertensive heart disease    a. echo 4/12: EF 50%, asymmetric septal hypertrophy, no SAM or LVOT gradient, LAE, PASP 35  . Inguinal hernia   . Inguinal hernia without mention of obstruction or gangrene, unilateral or unspecified, (not specified as recurrent)   . 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 Mitral regurgitation    a. Mild by echo 03/2012 and 03/2013.  . Mixed Hyperlipidemia   . Peripheral arterial disease ([place])   . Peripheral vascular disease ([place])    a. h/o LE angioplasty;  b. 08/2015 Duplex: >50% RCIA, 100% REIA, >50% LEIA, elev vel in SMA, patent IVC.  Marland Kitchen Persistent atrial fibrillation ([place])    a. Dx 12/2010-->coumadin (CHA2DS2VASc = 7).  . QT prolongation     Past Surgical History:  Procedure Laterality Date  . HERNIA REPAIR    . LEFT HEART CATH N/A 12/30/2012   Procedure: LEFT HEART CATH;  Surgeon: [name], MD;  Location[facility] CATH LAB;  Service: Cardiovascular;  Laterality: N/A;  . ORIF TIBIA & FIBULA FRACTURES  01/11/07   OPEN TX OF UNICONDYLAR  PLATEAU FRACTURE, IRRIGATION/DEBRIDEMENT OF OPEN FRACTURE INCLUDING BONE, REMOVAL OF EXTERNAL FIXATOR UNDER ANESTHESIA PER DR. MICHAEL HANDY  . PERIPHERAL VASCULAR CATHETERIZATION N/A 12/21/2015   Procedure: Lower Extremity Angiography;  Surgeon: [name], MD;  Location: [place] CV LAB;  Service: Cardiovascular;  Laterality: N/A;  . POPLITEAL ARTERY ANGIOPLASTY  01/07/07   s/p LEFT POPLITEAL ARTERY EXPLORATION AND VEIN PATCH ANGIOPLASTY, PER DR. EARLY, SECONDARY TO ISCHEMIC LEFT FOOT RELATED TO LEFT POPLITEAL ARTERY INJURY  . SKIN GRAFT      Social History   Social History  . Marital status: Married    Spouse name: N/A  . Number of children: 4  . Years of education: N/A   Occupational History  . 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 Smoking status: Current Some Day Smoker    Packs/day: 0.50    Years: 30.00    Types: Cigarettes    Last attempt to quit: 03/12/2012  . Smokeless tobacco: Never Used  . Alcohol use  No  . Drug use: No  . Sexual activity: Not on file   Other Topics Concern  . 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 YES. 1/2 -1 PPD OF CIGARETTES    NO ETOH    Family History  Problem Relation Age of Onset  . Heart disease Father        ALSO UNCLE DIED HAD CAD  . Heart failure Father   . Brain cancer Mother   . Leukemia Mother   . Sickle cell anemia Mother   . Sickle cell anemia Other     ROS: no fevers or chills, productive cough, hemoptysis, dysphasia, odynophagia, melena, hematochezia, dysuria, hematuria, rash, seizure activity, orthopnea, PND, pedal edema, claudication. Remaining systems are negative.  Physical Exam: Well-developed well-nourished in no acute distress.  Skin is warm and dry.  HEENT is normal.  Neck is supple.  Chest is clear to auscultation with normal expansion.  Cardiovascular exam  Bradycardic and irregular Abdominal exam nontender or distended. No masses palpated. Extremities show no edema. neuro grossly intact  ECG- atrial fibrillation with slow ventricular response at 51, left ventricular hypertrophy with repolarization abnormality. personally reviewed  A/P  1 Atrial fibrillation-we will continue with present dose of Coumadin. Continue beta blocker for rate control. His heart rate is mildly decreased. Decrease Toprol to 100 mg daily and follow.  2 chronic combined systolic/diastolic congestive heart failure-patient is much improved. Continue present dose of Lasix.  3 Coronary artery disease-continue statin. No aspirin given need for anticoagulation.  4 ischemic cardiomyopathy-continue beta blocker but decrease Toprol to 100 mg daily given bradycardia. Increase Cozaar to 50 mg daily. Discontinue hydralazine. Check potassium and renal function in 1 week. We will plan to repeat echocardiogram in 3 months following these medication adjustments. If ejection fraction less than 35% would need to consider ICD.  5 carotid artery disease-plan follow-up carotid Dopplers October 2018.  6 chronic stage III kidney disease-recheck potassium and renal function.  7 abdominal aortic aneurysm-follow-up ultrasound in February 2019.   8 hypertension-pressure is controlled. Medication adjustments as outlined above for atrial fibrillation and cardiomyopathy.  9 peripheral vascular disease-continue statin.  10 tobacco abuse-patient counseled on discontinuing. We have given a prescription for Wellbutrin today.   11 mitral regurgitation-we will need follow-up echoes in the future.    Kirk Ruths, MD

## 2017-02-27 ENCOUNTER — Encounter: Payer: Self-pay | Admitting: Cardiology

## 2017-02-27 ENCOUNTER — Ambulatory Visit (INDEPENDENT_AMBULATORY_CARE_PROVIDER_SITE_OTHER): Payer: Medicare HMO | Admitting: Pharmacist Clinician (PhC)/ Clinical Pharmacy Specialist

## 2017-02-27 ENCOUNTER — Ambulatory Visit (INDEPENDENT_AMBULATORY_CARE_PROVIDER_SITE_OTHER): Payer: Medicare HMO | Admitting: Cardiology

## 2017-02-27 VITALS — BP 124/68 | HR 51 | Ht 72.5 in | Wt 169.0 lb

## 2017-02-27 DIAGNOSIS — I482 Chronic atrial fibrillation, unspecified: Secondary | ICD-10-CM

## 2017-02-27 DIAGNOSIS — I4891 Unspecified atrial fibrillation: Secondary | ICD-10-CM | POA: Diagnosis not present

## 2017-02-27 DIAGNOSIS — I2510 Atherosclerotic heart disease of native coronary artery without angina pectoris: Secondary | ICD-10-CM

## 2017-02-27 DIAGNOSIS — Z7901 Long term (current) use of anticoagulants: Secondary | ICD-10-CM | POA: Diagnosis not present

## 2017-02-27 DIAGNOSIS — Z5181 Encounter for therapeutic drug level monitoring: Secondary | ICD-10-CM

## 2017-02-27 DIAGNOSIS — I4821 Permanent atrial fibrillation: Secondary | ICD-10-CM

## 2017-02-27 DIAGNOSIS — I5042 Chronic combined systolic (congestive) and diastolic (congestive) heart failure: Secondary | ICD-10-CM | POA: Diagnosis not present

## 2017-02-27 LAB — POCT INR: INR: 3

## 2017-02-27 MED ORDER — LOSARTAN POTASSIUM 50 MG PO TABS: 50.0000 mg | ORAL_TABLET | Freq: Every day | ORAL | 3 refills | Status: DC

## 2017-02-27 MED ORDER — METOPROLOL SUCCINATE ER 100 MG PO TB24: 100.0000 mg | ORAL_TABLET | Freq: Every day | ORAL | 3 refills | Status: DC

## 2017-02-27 MED ORDER — BUPROPION HCL ER (SR) 150 MG PO TB12
150.0000 mg | ORAL_TABLET | Freq: Two times a day (BID) | ORAL | 0 refills | Status: DC
Start: 2017-02-27 — End: 2017-05-30

## 2017-02-27 NOTE — Patient Instructions (Signed)
Medication Instructions:   DECREASE METOPROLOL TO 100 MG ONCE DAILY= 2 OF THE 50 MG TABLETS ONCE DAILY  STOP HYDRALAZINE  INCREASE LOSARTAN TO 50 MG ONCE DAILY= 2 OF THE 25 MG TABLETS ONCE DAILY  START WELLBUTRIN 150 MG ONCE DAILY X 3 DAYS THEN INCREASE TO 1 TABLET TWICE DAILY UNTIL COMPLETE  Labwork:  Your physician recommends that you return for lab work in: [place]:  Your physician recommends that you schedule a follow-up appointment in: [place]   If you need a refill on your cardiac medications before your next appointment, please call your pharmacy.

## 2017-03-13 ENCOUNTER — Encounter: Payer: Self-pay | Admitting: *Deleted

## 2017-03-27 ENCOUNTER — Ambulatory Visit (INDEPENDENT_AMBULATORY_CARE_PROVIDER_SITE_OTHER): Payer: Medicare HMO | Admitting: Pharmacist

## 2017-03-27 DIAGNOSIS — Z7901 Long term (current) use of anticoagulants: Secondary | ICD-10-CM

## 2017-03-27 DIAGNOSIS — I4891 Unspecified atrial fibrillation: Secondary | ICD-10-CM

## 2017-03-27 DIAGNOSIS — I482 Chronic atrial fibrillation, unspecified: Secondary | ICD-10-CM

## 2017-03-27 DIAGNOSIS — Z5181 Encounter for therapeutic drug level monitoring: Secondary | ICD-10-CM

## 2017-03-27 LAB — POCT INR: INR: 1.6

## 2017-04-24 ENCOUNTER — Ambulatory Visit (INDEPENDENT_AMBULATORY_CARE_PROVIDER_SITE_OTHER): Payer: Medicare HMO | Admitting: Pharmacist

## 2017-04-24 DIAGNOSIS — Z7901 Long term (current) use of anticoagulants: Secondary | ICD-10-CM

## 2017-04-24 DIAGNOSIS — I482 Chronic atrial fibrillation, unspecified: Secondary | ICD-10-CM

## 2017-04-24 DIAGNOSIS — I4891 Unspecified atrial fibrillation: Secondary | ICD-10-CM | POA: Diagnosis not present

## 2017-04-24 DIAGNOSIS — Z5181 Encounter for therapeutic drug level monitoring: Secondary | ICD-10-CM | POA: Diagnosis not present

## 2017-04-24 LAB — POCT INR: INR: 2

## 2017-05-10 ENCOUNTER — Other Ambulatory Visit: Payer: Self-pay | Admitting: Cardiology

## 2017-05-19 ENCOUNTER — Encounter: Payer: Self-pay | Admitting: Cardiology

## 2017-05-19 NOTE — Progress Notes (Deleted)
HPI: FU Atrial fibrillation, CAD, previous V fib arrest and ischemic CM. Patient with a history of PCI of his LAD in October 2004. He was admitted in June of 2013 following a ventricular fibrillation arrest that was felt related to hypokalemia (K 2.2). Patient improved following that episode. Patient admitted in April of 2014 following a ventricular fibrillation arrest. Cardiac catheterization in April of 2014 showed a patent stent in the LAD with a 40% in-stent restenosis. There was an apical 80% lesion. The circumflex was occluded. The right coronary artery was small and nondominant. The patient had attempt at PCI of the occluded circumflex but was unsuccessful. Echocardiogram in April of 2014 showed an ejection fraction of 30-35%, mild left atrial enlargement, moderately reduced RV function and mild mitral regurgitation. Patient was seen by Dr. Lovena Le of electrophysiology and ICD felt not indicated at that time because the patient did suffer a non-ST elevation myocardial infarction with positive enzymes. He was discharged with a life vest. Nuclear study January 2017 showed fixed inferior and apical defect but no ischemia. Patient had angiogram for peripheral vascular disease in April 2017 that showed an occluded right external iliac and common femoral artery not amenable to percutaneous intervention and an 80% left common femoral artery stenosis and an occluded left SFA also not amenable to percutaneous intervention. Treated medically.Carotid Dopplers October 2017 showed 40-59% bilateral stenosis. Abdominal ultrasound February 2018 showed 2.7 x 2.9 abdominal aortic aneurysm and greater than 50% bilateral iliac stenosis. Echocardiogram repeated March 2018 and showed ejection fraction 30-35%, moderate left ventricular enlargement, moderate aortic insufficiency, severe mitral regurgitation, biatrial enlargement, mild right ventricular enlargement, mild tricuspid regurgitation. Since last seen,   Current  Outpatient Prescriptions  Medication Sig Dispense Refill  . albuterol (PROVENTIL HFA;VENTOLIN HFA) 108 (90 Base) MCG/ACT inhaler Inhale into the lungs every 6 (six) hours as needed for wheezing or shortness of breath.    Marland Kitchen atorvastatin (LIPITOR) 80 MG tablet TAKE 1 TABLET (80 MG TOTAL) BY MOUTH DAILY. 90 tablet 3  . azithromycin (ZITHROMAX Z-PAK) 250 MG tablet Take as directed 6 each 0  . buPROPion (WELLBUTRIN SR) 150 MG 12 hr tablet Take 1 tablet (150 mg total) by mouth 2 (two) times daily. 168 tablet 0  . furosemide (LASIX) 20 MG tablet Take 2 tablets (40 mg total) by mouth daily. 160 tablet 3  . ibuprofen (ADVIL,MOTRIN) 200 MG tablet Take 400 mg by mouth every 6 (six) hours as needed for headache.    Marland Kitchen KLOR-CON M20 20 MEQ tablet TAKE ONE TABLET BY MOUTH TWICE DAILY 60 tablet 11  . losartan (COZAAR) 50 MG tablet Take 1 tablet (50 mg total) by mouth daily. 90 tablet 3  . metoprolol succinate (TOPROL-XL) 100 MG 24 hr tablet Take 1 tablet (100 mg total) by mouth daily. 90 tablet 3  . nitroGLYCERIN (NITROSTAT) 0.4 MG SL tablet Place 1 tablet (0.4 mg total) under the tongue every 5 (five) minutes as needed for chest pain (Up to 3 doses). 25 tablet 3  . warfarin (COUMADIN) 2.5 MG tablet TAKE 1 TABLET BY MOUTH DAILY OR AS DIRECTED BY COUMADIN CLINIC 30 tablet 1   No current facility-administered medications for this visit.      Past Medical History:  Diagnosis Date  . AAA (abdominal aortic aneurysm) ([place])    a. 08/2015 Abd U/S: 2.7 x 2.9 x 3.2 cm infrarenal AAA.  . Bradycardia    a. Requiring discontinuation of use of BB/CCB  . Cardiac arrest -  ventricular fibrillation    a. 03/2012 in setting of hypokalemia (prolonged hosp with VDRF, tracheobronchitis, ARF, shock liver, PAF, AMS felt secondary to post-anoxic encephalopathy/shock).  . Carotid artery stenosis    a. 01/2011 - 40-59% bilateral stenosis;  b. 06/2015 Carotid U/S: 40-59% bilat ICA stenosis->f/u 6 mos.  . CKD (chronic kidney disease),  stage III   . Coronary artery disease    a. s/p aborted ant STEMI tx with Cypher DES to LAD 10/04 (residual at cath: D1 50%, CFX 40% and multiple dist 70%, EF 55%);   b. myoview 3/10: Ef 47%, infero-apical isch, LOW RISK - med Tx recommended;  c. 12/2012 VF Arrest/Cath: LAD 40isr, 80 apical, LCX 100 (failed PTCA), RCA nondom.  . Diabetes mellitus   . Diverticulosis    2001  . Diverticulosis 2001  . Elevated LFTs    Shock liver 03/2012  . H/O: CVA (cardiovascular accident)   . Hematemesis    a. 12/2010 felt 2/2 Mallory Weiss tear - pt could not afford colonscopy/EGD at that time so Coumadin was deferred. Coumadin initiated 03/2012 without any evidence for bleeding.  Marland Kitchen History of Ischemic Cardiomyopathy    a. EF 50-55% by echo 03/09/12 (was 30% by echo 02/24/12); b. 03/2013 Echo: EF 55-60%, mild MR, sev dil LA, mod dil RA, PASP 94mmHg.  Marland Kitchen Hypertensive heart disease    a. echo 4/12: EF 50%, asymmetric septal hypertrophy, no SAM or LVOT gradient, LAE, PASP 35  . Inguinal hernia   . Inguinal hernia without mention of obstruction or gangrene, unilateral or unspecified, (not specified as recurrent)   . 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 Mitral regurgitation    a. Mild by echo 03/2012 and 03/2013.  . Mixed Hyperlipidemia   . Peripheral arterial disease ([place])   . Peripheral vascular disease ([place])    a. h/o LE angioplasty;  b. 08/2015 Duplex: >50% RCIA, 100% REIA, >50% LEIA, elev vel in SMA, patent IVC.  Marland Kitchen Persistent atrial fibrillation ([place])    a. Dx 12/2010-->coumadin (CHA2DS2VASc = 7).  . QT prolongation     Past Surgical History:  Procedure Laterality Date  . HERNIA REPAIR    . LEFT HEART CATH N/A 12/30/2012   Procedure: LEFT HEART CATH;  Surgeon: [name], MD;  Location: [facility] CATH LAB;  Service: Cardiovascular;  Laterality: N/A;  . ORIF TIBIA & FIBULA FRACTURES  01/11/07   OPEN TX OF UNICONDYLAR PLATEAU FRACTURE, IRRIGATION/DEBRIDEMENT OF OPEN FRACTURE  INCLUDING BONE, REMOVAL OF EXTERNAL FIXATOR UNDER ANESTHESIA PER DR. MICHAEL HANDY  . PERIPHERAL VASCULAR CATHETERIZATION N/A 12/21/2015   Procedure: Lower Extremity Angiography;  Surgeon: [name], MD;  Location: [facility] CV LAB;  Service: Cardiovascular;  Laterality: N/A;  . POPLITEAL ARTERY ANGIOPLASTY  01/07/07   s/p LEFT POPLITEAL ARTERY EXPLORATION AND VEIN PATCH ANGIOPLASTY, PER DR. EARLY, SECONDARY TO ISCHEMIC LEFT FOOT RELATED TO LEFT POPLITEAL ARTERY INJURY  . SKIN GRAFT      Social History   Social History  . Marital status: Married    Spouse name: N/A  . Number of children: 4  . Years of education: N/A   Occupational History  . 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 Smoking status: Current Some Day Smoker    Packs/day: 0.50    Years: 30.00    Types: Cigarettes    Last attempt to quit: 03/12/2012  . Smokeless tobacco: Never Used  . Alcohol use No  .  Drug use: No  . Sexual activity: Not on file   Other Topics Concern  . 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 YES. 1/2 -1 PPD OF CIGARETTES    NO ETOH    Family History  Problem Relation Age of Onset  . Heart disease Father        ALSO UNCLE DIED HAD CAD  . Heart failure Father   . Brain cancer Mother   . Leukemia Mother   . Sickle cell anemia Mother   . Sickle cell anemia Other     ROS: no fevers or chills, productive cough, hemoptysis, dysphasia, odynophagia, melena, hematochezia, dysuria, hematuria, rash, seizure activity, orthopnea, PND, pedal edema, claudication. Remaining systems are negative.  Physical Exam: Well-developed well-nourished in no acute distress.  Skin is warm and dry.  HEENT is normal.  Neck is supple.  Chest is clear to auscultation with normal expansion.  Cardiovascular exam is regular rate and rhythm.  Abdominal exam nontender or  distended. No masses palpated. Extremities show no edema. neuro grossly intact  ECG- personally reviewed  A/P  1 Prominent atrial fibrillation-plan to continue present dose of Toprol for rate control. Continue Coumadin.  2 coronary artery disease-continue statin. Patient not on aspirin given the need for anticoagulation.  3 chronic combined systolic/diastolic congestive heart failure-volume status is stable. Continue present dose of Lasix and follow.  4 ischemic cardiomyopathy-plan to continue present dose of beta blocker and ARB. Check potassium and renal function. Repeat echocardiogram. If ejection fraction less than 35% will refer for ICD.  5 carotid artery disease-follow-up carotid Dopplers October 2018.  6 chronic stage III kidney disease-check potassium and renal function.  7 abdominal aortic aneurysm-follow-up ultrasound and/or a 2019.  8 tobacco abuse-patient counseled on discontinuing.  9 peripheral vascular disease-continue statin.  10 hypertension-blood pressure is controlled. Continue present medications.  11 mitral regurgitation-plan follow-up echocardiogram.   Kirk Ruths, MD

## 2017-05-30 ENCOUNTER — Other Ambulatory Visit: Payer: Self-pay | Admitting: Cardiology

## 2017-06-01 ENCOUNTER — Ambulatory Visit: Payer: Medicare HMO | Admitting: Cardiology

## 2017-06-02 ENCOUNTER — Encounter: Payer: Self-pay | Admitting: *Deleted

## 2017-06-13 ENCOUNTER — Other Ambulatory Visit: Payer: Self-pay | Admitting: Cardiology

## 2017-06-20 ENCOUNTER — Other Ambulatory Visit: Payer: Self-pay | Admitting: *Deleted

## 2017-06-20 DIAGNOSIS — I679 Cerebrovascular disease, unspecified: Secondary | ICD-10-CM

## 2017-07-03 ENCOUNTER — Ambulatory Visit (HOSPITAL_COMMUNITY)
Admission: RE | Admit: 2017-07-03 | Discharge: 2017-07-03 | Disposition: A | Discharge status: Home / Self Care | Payer: Medicare HMO | Source: Ambulatory Visit | Attending: Cardiovascular Disease | Admitting: Cardiovascular Disease

## 2017-07-03 DIAGNOSIS — I679 Cerebrovascular disease, unspecified: Secondary | ICD-10-CM | POA: Diagnosis not present

## 2017-07-03 DIAGNOSIS — Z8673 Personal history of transient ischemic attack (TIA), and cerebral infarction without residual deficits: Secondary | ICD-10-CM | POA: Insufficient documentation

## 2017-07-03 DIAGNOSIS — I10 Essential (primary) hypertension: Secondary | ICD-10-CM | POA: Insufficient documentation

## 2017-07-03 DIAGNOSIS — F17200 Nicotine dependence, unspecified, uncomplicated: Secondary | ICD-10-CM | POA: Insufficient documentation

## 2017-07-03 DIAGNOSIS — E119 Type 2 diabetes mellitus without complications: Secondary | ICD-10-CM | POA: Diagnosis not present

## 2017-07-03 DIAGNOSIS — E785 Hyperlipidemia, unspecified: Secondary | ICD-10-CM | POA: Diagnosis not present

## 2017-07-16 ENCOUNTER — Other Ambulatory Visit: Payer: Self-pay | Admitting: Cardiology

## 2017-07-21 ENCOUNTER — Other Ambulatory Visit: Payer: Self-pay | Admitting: Cardiovascular Disease

## 2017-07-21 ENCOUNTER — Other Ambulatory Visit: Payer: Self-pay | Admitting: Cardiology

## 2017-07-24 ENCOUNTER — Other Ambulatory Visit: Payer: Self-pay | Admitting: Pharmacist

## 2017-07-24 MED ORDER — WARFARIN SODIUM 2.5 MG PO TABS: ORAL_TABLET | ORAL | 0 refills | Status: DC

## 2017-08-16 ENCOUNTER — Other Ambulatory Visit: Payer: Self-pay | Admitting: Cardiology

## 2017-09-07 ENCOUNTER — Other Ambulatory Visit: Payer: Self-pay | Admitting: Cardiology

## 2017-09-07 NOTE — Telephone Encounter (Signed)
New Rx cancelled. Patient no show for coumadin checks since 04/2017. Noted 2 "no show" appoitment.

## 2017-09-11 ENCOUNTER — Other Ambulatory Visit: Payer: Self-pay | Admitting: Cardiology

## 2017-09-19 ENCOUNTER — Other Ambulatory Visit: Payer: Self-pay | Admitting: Cardiology

## 2017-09-21 NOTE — Telephone Encounter (Signed)
LMOM for patient to return call regarding need for INR check

## 2017-09-26 ENCOUNTER — Ambulatory Visit (INDEPENDENT_AMBULATORY_CARE_PROVIDER_SITE_OTHER): Payer: Medicare HMO | Admitting: Pharmacist

## 2017-09-26 DIAGNOSIS — Z7901 Long term (current) use of anticoagulants: Secondary | ICD-10-CM | POA: Diagnosis not present

## 2017-09-26 DIAGNOSIS — Z5181 Encounter for therapeutic drug level monitoring: Secondary | ICD-10-CM | POA: Diagnosis not present

## 2017-09-26 DIAGNOSIS — I482 Chronic atrial fibrillation, unspecified: Secondary | ICD-10-CM

## 2017-09-26 LAB — POCT INR: INR: 2.8

## 2017-09-26 MED ORDER — WARFARIN SODIUM 2.5 MG PO TABS: ORAL_TABLET | ORAL | 0 refills | Status: DC

## 2017-09-28 ENCOUNTER — Other Ambulatory Visit: Payer: Self-pay | Admitting: *Deleted

## 2017-09-28 DIAGNOSIS — I714 Abdominal aortic aneurysm, without rupture, unspecified: Secondary | ICD-10-CM

## 2017-10-31 ENCOUNTER — Ambulatory Visit (HOSPITAL_COMMUNITY)
Admission: RE | Admit: 2017-10-31 | Discharge: 2017-10-31 | Disposition: A | Discharge status: Home / Self Care | Payer: Medicare HMO | Source: Ambulatory Visit | Attending: Cardiology | Admitting: Cardiology

## 2017-10-31 ENCOUNTER — Ambulatory Visit (INDEPENDENT_AMBULATORY_CARE_PROVIDER_SITE_OTHER): Payer: Medicare HMO | Admitting: Pharmacist

## 2017-10-31 DIAGNOSIS — Z5181 Encounter for therapeutic drug level monitoring: Secondary | ICD-10-CM | POA: Diagnosis not present

## 2017-10-31 DIAGNOSIS — I708 Atherosclerosis of other arteries: Secondary | ICD-10-CM | POA: Insufficient documentation

## 2017-10-31 DIAGNOSIS — I482 Chronic atrial fibrillation, unspecified: Secondary | ICD-10-CM

## 2017-10-31 DIAGNOSIS — I714 Abdominal aortic aneurysm, without rupture, unspecified: Secondary | ICD-10-CM

## 2017-10-31 LAB — POCT INR: INR: 1.2

## 2017-11-02 ENCOUNTER — Telehealth: Payer: Self-pay | Admitting: *Deleted

## 2017-11-02 DIAGNOSIS — K7689 Other specified diseases of liver: Secondary | ICD-10-CM

## 2017-11-02 NOTE — Telephone Encounter (Addendum)
-----   Message from Lelon Perla, MD sent at 10/31/2017  4:55 PM EST ----- Schedule dedicated liver ultrasound; fu abdominal ultrasound one year Kirk Ruths  Left message for pt to call

## 2017-11-06 NOTE — Telephone Encounter (Signed)
Spoke with pt, Logan White abdomen RUQ to look at the liver scheduled for 11-09-17 @ 8:30 am @ [address] [facility]. He will need to arrive @ 8:10 am and he will need to be NPO past midnight.

## 2017-11-09 ENCOUNTER — Ambulatory Visit
Admission: RE | Admit: 2017-11-09 | Discharge: 2017-11-09 | Disposition: A | Discharge status: Home / Self Care | Payer: Medicare HMO | Source: Ambulatory Visit | Attending: Cardiology | Admitting: Cardiology

## 2017-11-09 DIAGNOSIS — K7689 Other specified diseases of liver: Secondary | ICD-10-CM | POA: Diagnosis not present

## 2017-11-09 DIAGNOSIS — K8020 Calculus of gallbladder without cholecystitis without obstruction: Secondary | ICD-10-CM | POA: Diagnosis not present

## 2017-11-11 ENCOUNTER — Other Ambulatory Visit: Payer: Self-pay | Admitting: Cardiology

## 2017-11-26 ENCOUNTER — Encounter (HOSPITAL_COMMUNITY): Payer: Self-pay | Admitting: Emergency Medicine

## 2017-11-26 ENCOUNTER — Other Ambulatory Visit: Payer: Self-pay

## 2017-11-26 DIAGNOSIS — N183 Chronic kidney disease, stage 3 (moderate): Secondary | ICD-10-CM | POA: Insufficient documentation

## 2017-11-26 DIAGNOSIS — E1122 Type 2 diabetes mellitus with diabetic chronic kidney disease: Secondary | ICD-10-CM | POA: Diagnosis not present

## 2017-11-26 DIAGNOSIS — Z7901 Long term (current) use of anticoagulants: Secondary | ICD-10-CM | POA: Diagnosis not present

## 2017-11-26 DIAGNOSIS — I10 Essential (primary) hypertension: Secondary | ICD-10-CM | POA: Diagnosis not present

## 2017-11-26 DIAGNOSIS — R748 Abnormal levels of other serum enzymes: Secondary | ICD-10-CM | POA: Insufficient documentation

## 2017-11-26 DIAGNOSIS — Z79899 Other long term (current) drug therapy: Secondary | ICD-10-CM | POA: Diagnosis not present

## 2017-11-26 DIAGNOSIS — I509 Heart failure, unspecified: Secondary | ICD-10-CM | POA: Diagnosis not present

## 2017-11-26 DIAGNOSIS — I130 Hypertensive heart and chronic kidney disease with heart failure and stage 1 through stage 4 chronic kidney disease, or unspecified chronic kidney disease: Secondary | ICD-10-CM | POA: Insufficient documentation

## 2017-11-26 DIAGNOSIS — I2510 Atherosclerotic heart disease of native coronary artery without angina pectoris: Secondary | ICD-10-CM | POA: Diagnosis not present

## 2017-11-26 DIAGNOSIS — R112 Nausea with vomiting, unspecified: Secondary | ICD-10-CM | POA: Diagnosis not present

## 2017-11-26 DIAGNOSIS — Z8679 Personal history of other diseases of the circulatory system: Secondary | ICD-10-CM | POA: Insufficient documentation

## 2017-11-26 DIAGNOSIS — N289 Disorder of kidney and ureter, unspecified: Secondary | ICD-10-CM | POA: Diagnosis not present

## 2017-11-26 DIAGNOSIS — F17210 Nicotine dependence, cigarettes, uncomplicated: Secondary | ICD-10-CM | POA: Insufficient documentation

## 2017-11-26 NOTE — ED Triage Notes (Signed)
Pt family reports that pt has been having vomiting and chills with sweating for the last 2 hours.

## 2017-11-27 ENCOUNTER — Emergency Department (HOSPITAL_COMMUNITY)
Admission: EM | Admit: 2017-11-27 | Discharge: 2017-11-27 | Disposition: A | Discharge status: Home / Self Care | Payer: Medicare HMO | Attending: Emergency Medicine | Admitting: Emergency Medicine

## 2017-11-27 DIAGNOSIS — N289 Disorder of kidney and ureter, unspecified: Secondary | ICD-10-CM

## 2017-11-27 DIAGNOSIS — R112 Nausea with vomiting, unspecified: Secondary | ICD-10-CM

## 2017-11-27 DIAGNOSIS — R748 Abnormal levels of other serum enzymes: Secondary | ICD-10-CM

## 2017-11-27 LAB — COMPREHENSIVE METABOLIC PANEL
ALT: 20 U/L (ref 17–63)
AST: 31 U/L (ref 15–41)
Albumin: 4.7 g/dL (ref 3.5–5.0)
Alkaline Phosphatase: 153 U/L — ABNORMAL HIGH (ref 38–126)
Anion gap: 14 (ref 5–15)
BUN: 14 mg/dL (ref 6–20)
CO2: 26 mmol/L (ref 22–32)
CREATININE: 1.75 mg/dL — AB (ref 0.61–1.24)
Calcium: 9.7 mg/dL (ref 8.9–10.3)
Chloride: 103 mmol/L (ref 101–111)
GFR, EST AFRICAN AMERICAN: 42 mL/min — AB (ref >60)
GFR, EST NON AFRICAN AMERICAN: 37 mL/min — AB (ref >60)
Glucose, Bld: 169 mg/dL — ABNORMAL HIGH (ref 65–99)
Potassium: 3.3 mmol/L — ABNORMAL LOW (ref 3.5–5.1)
Sodium: 143 mmol/L (ref 135–145)
Total Bilirubin: 1 mg/dL (ref 0.3–1.2)
Total Protein: 8.5 g/dL — ABNORMAL HIGH (ref 6.5–8.1)

## 2017-11-27 LAB — CBC
HCT: 46.1 % (ref 39.0–52.0)
Hemoglobin: 15.8 g/dL (ref 13.0–17.0)
MCH: 27.4 pg (ref 26.0–34.0)
MCHC: 34.3 g/dL (ref 30.0–36.0)
MCV: 79.9 fL (ref 78.0–100.0)
PLATELETS: 245 10*3/uL (ref 150–400)
RBC: 5.77 MIL/uL (ref 4.22–5.81)
RDW: 14.9 % (ref 11.5–15.5)
WBC: 8.8 10*3/uL (ref 4.0–10.5)

## 2017-11-27 LAB — URINALYSIS, ROUTINE W REFLEX MICROSCOPIC
BILIRUBIN URINE: NEGATIVE
Bacteria, UA: NONE SEEN
Glucose, UA: NEGATIVE mg/dL
Ketones, ur: NEGATIVE mg/dL
LEUKOCYTES UA: NEGATIVE
Nitrite: NEGATIVE
Protein, ur: NEGATIVE mg/dL
SPECIFIC GRAVITY, URINE: 1.011 (ref 1.005–1.030)
SQUAMOUS EPITHELIAL / LPF: NONE SEEN
pH: 5 (ref 5.0–8.0)

## 2017-11-27 LAB — DIFFERENTIAL
BASOS ABS: 0 10*3/uL (ref 0.0–0.1)
Basophils Relative: 0 %
EOS ABS: 0 10*3/uL (ref 0.0–0.7)
Eosinophils Relative: 1 %
LYMPHS ABS: 2.1 10*3/uL (ref 0.7–4.0)
LYMPHS PCT: 24 %
Monocytes Absolute: 0.6 10*3/uL (ref 0.1–1.0)
Monocytes Relative: 7 %
NEUTROS PCT: 68 %
Neutro Abs: 6 10*3/uL (ref 1.7–7.7)

## 2017-11-27 LAB — LIPASE, BLOOD: Lipase: 21 U/L (ref 11–51)

## 2017-11-27 LAB — PROTIME-INR
INR: 1.86
PROTHROMBIN TIME: 21.3 s — AB (ref 11.4–15.2)

## 2017-11-27 LAB — TROPONIN I
TROPONIN I: 0.05 ng/mL — AB (ref <0.03)
Troponin I: 0.07 ng/mL (ref <0.03)

## 2017-11-27 MED ORDER — ONDANSETRON HCL 4 MG PO TABS: 4.0000 mg | ORAL_TABLET | Freq: Four times a day (QID) | ORAL | 0 refills | Status: DC | PRN

## 2017-11-27 MED ORDER — ONDANSETRON HCL 4 MG/2ML IJ SOLN
4.0000 mg | Freq: Once | INTRAMUSCULAR | Status: AC
  Administered 2017-11-27: 4 mg via INTRAVENOUS
  Filled 2017-11-27: qty 2

## 2017-11-27 MED ORDER — SODIUM CHLORIDE 0.9 % IV BOLUS (SEPSIS)
1000.0000 mL | Freq: Once | INTRAVENOUS | Status: AC
  Administered 2017-11-27: 1000 mL via INTRAVENOUS

## 2017-11-27 NOTE — Discharge Instructions (Addendum)
Return if symptoms are getting worse. °

## 2017-11-27 NOTE — ED Notes (Signed)
Patient provided with water for PO challenge.

## 2017-11-27 NOTE — ED Provider Notes (Signed)
[facility] DEPT Provider Note   CSN: [id number] Arrival date & time: 11/26/17  2344     History   Chief Complaint Chief Complaint  Patient presents with  . Emesis    HPI Logan White is a 77 y.o. male.  The history is provided by the patient.  Emesis    He has a history of diabetes, hypertension, hyperlipidemia, abdominal aortic aneurysm, chronic kidney disease, coronary artery disease, atrial fibrillation and comes in with approximately 2-hour history of chills, sweats, nausea, vomiting.  He states that he has had anorexia the entire day today.  Vomiting started this evening.  He has been trying to drink fluids, but vomited immediately after drinking them.  He is very thirsty now.  He denies abdominal pain, constipation, diarrhea.  He denies history of abdominal surgery.  He denies any sick contacts.  Past Medical History:  Diagnosis Date  . AAA (abdominal aortic aneurysm) ([place])    a. 08/2015 Abd U/S: 2.7 x 2.9 x 3.2 cm infrarenal AAA.  . Bradycardia    a. Requiring discontinuation of use of BB/CCB  . Cardiac arrest - ventricular fibrillation    a. 03/2012 in setting of hypokalemia (prolonged hosp with VDRF, tracheobronchitis, ARF, shock liver, PAF, AMS felt secondary to post-anoxic encephalopathy/shock).  . Carotid artery stenosis    a. 01/2011 - 40-59% bilateral stenosis;  b. 06/2015 Carotid U/S: 40-59% bilat ICA stenosis->f/u 6 mos.  . CKD (chronic kidney disease), stage III ([place])   . Coronary artery disease    a. s/p aborted ant STEMI tx with Cypher DES to LAD 10/04 (residual at cath: D1 50%, CFX 40% and multiple dist 70%, EF 55%);   b. myoview 3/10: Ef 47%, infero-apical isch, LOW RISK - med Tx recommended;  c. 12/2012 VF Arrest/Cath: LAD 40isr, 80 apical, LCX 100 (failed PTCA), RCA nondom.  . Diabetes mellitus   . Diverticulosis    2001  . Diverticulosis 2001  . Elevated LFTs    Shock liver 03/2012  . H/O: CVA (cardiovascular accident)    . Hematemesis    a. 12/2010 felt 2/2 Mallory Weiss tear - pt could not afford colonscopy/EGD at that time so Coumadin was deferred. Coumadin initiated 03/2012 without any evidence for bleeding.  Marland Kitchen History of Ischemic Cardiomyopathy    a. EF 50-55% by echo 03/09/12 (was 30% by echo 02/24/12); b. 03/2013 Echo: EF 55-60%, mild MR, sev dil LA, mod dil RA, PASP 34mmHg.  Marland Kitchen Hypertensive heart disease    a. echo 4/12: EF 50%, asymmetric septal hypertrophy, no SAM or LVOT gradient, LAE, PASP 35  . Inguinal hernia   . Inguinal hernia without mention of obstruction or gangrene, unilateral or unspecified, (not specified as recurrent)   . 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 Mitral regurgitation    a. Mild by echo 03/2012 and 03/2013.  . Mixed Hyperlipidemia   . Peripheral arterial disease ([place])   . Peripheral vascular disease ([place])    a. h/o LE angioplasty;  b. 08/2015 Duplex: >50% RCIA, 100% REIA, >50% LEIA, elev vel in SMA, patent IVC.  Marland Kitchen Persistent atrial fibrillation ([place])    a. Dx 12/2010-->coumadin (CHA2DS2VASc = 7).  . QT prolongation     Patient Active Problem List   Diagnosis Date Noted  . CAD S/P PCI 10/13/2015  . Cardiomyopathy, new. Etiology apeears to be NICM 10/13/2015  . AAA (abdominal aortic aneurysm) ([place])   . Peripheral vascular disease ([place])   . Carotid  artery stenosis   . Hypertensive heart disease   . Bruit 10/24/2014  . Encounter for therapeutic drug monitoring 10/11/2013  . Acute on chronic renal failure ([place]) 01/11/2013  . Chronic atrial fibrillation ([place]) 01/11/2013  . Acute MI, anterolateral wall, initial episode of care ([place]) 12/31/2012  . Cardiac arrest due to underlying cardiac condition ([place]) 12/31/2012  . VF (ventricular fibrillation) ([place]) 12/31/2012  . Abnormal LFTs 12/31/2012  . Acute on chronic renal insufficiency 12/31/2012  . Chronic anticoagulation 12/31/2012  . Hypokalemia 12/31/2012  . Fatigue 04/23/2012  . Long term  (current) use of anticoagulants 03/19/2012  . CKD (chronic kidney disease), stage III ([place]) 03/16/2012  . Acute confusion/delerium 03/12/2012  . Delirium 03/12/2012  . Acute systolic CHF (congestive heart failure) ([place]) 03/11/2012  . Septic shock(785.52) 02/27/2012    Class: Acute  . Cardiogenic shock ([place]) 02/25/2012  . Acute renal failure ([place]) 02/25/2012  . Anoxic encephalopathy ([place]) 02/25/2012  . Ischemic cardiomyopathy 02/25/2012  . Cardiac arrest ([place]) 02/23/2012  . Acute respiratory failure with hypoxia ([place]) 02/23/2012  . Ventricular fibrillation ([place]) 02/23/2012  . DM (diabetes mellitus) ([place]) 02/23/2012  . Screening for colon cancer 02/10/2011  . Hematemesis 01/13/2011  . Dyspnea 01/13/2011  . RENAL INSUFFICIENCY 08/26/2010  . HYPERPOTASSEMIA 09/15/2009  . TOBACCO ABUSE 08/11/2009  . GLUCOSE INTOLERANCE 10/02/2008  . Hyperlipidemia 10/02/2008  . Essential hypertension 10/02/2008  . Cerebrovascular disease 10/02/2008  . PERIPHERAL VASCULAR DISEASE 10/02/2008  . INGUINAL HERNIA 10/02/2008    Past Surgical History:  Procedure Laterality Date  . HERNIA REPAIR    . LEFT HEART CATH N/A 12/30/2012   Procedure: LEFT HEART CATH;  Surgeon: [name], MD;  Location: [facility] CATH LAB;  Service: Cardiovascular;  Laterality: N/A;  . ORIF TIBIA & FIBULA FRACTURES  01/11/07   OPEN TX OF UNICONDYLAR PLATEAU FRACTURE, IRRIGATION/DEBRIDEMENT OF OPEN FRACTURE INCLUDING BONE, REMOVAL OF EXTERNAL FIXATOR UNDER ANESTHESIA PER DR. MICHAEL HANDY  . PERIPHERAL VASCULAR CATHETERIZATION N/A 12/21/2015   Procedure: Lower Extremity Angiography;  Surgeon: [name], MD;  Location: [facility] CV LAB;  Service: Cardiovascular;  Laterality: N/A;  . POPLITEAL ARTERY ANGIOPLASTY  01/07/07   s/p LEFT POPLITEAL ARTERY EXPLORATION AND VEIN PATCH ANGIOPLASTY, PER DR. EARLY, SECONDARY TO ISCHEMIC LEFT FOOT RELATED TO LEFT POPLITEAL ARTERY INJURY  . SKIN GRAFT          Home Medications    Prior  to Admission medications   Medication Sig Start Date End Date Taking? Authorizing Provider  atorvastatin (LIPITOR) 80 MG tablet TAKE 1 TABLET (80 MG TOTAL) BY MOUTH DAILY. 01/16/17  Yes Lelon Perla, MD  furosemide (LASIX) 20 MG tablet TAKE 2 TABLETS (40 MG TOTAL) BY MOUTH DAILY. 07/17/17  Yes Lelon Perla, MD  hydrALAZINE (APRESOLINE) 10 MG tablet TAKE 1 TABLET (10 MG TOTAL) BY MOUTH 3 (THREE) TIMES DAILY. 07/21/17  Yes [name], MD  KLOR-CON M20 20 MEQ tablet TAKE ONE TABLET BY MOUTH TWICE DAILY 12/14/16  Yes Lelon Perla, MD  losartan (COZAAR) 50 MG tablet Take 1 tablet (50 mg total) by mouth daily. 02/27/17 11/27/17 Yes Lelon Perla, MD  metoprolol succinate (TOPROL-XL) 100 MG 24 hr tablet Take 1 tablet (100 mg total) by mouth daily. 02/27/17  Yes Lelon Perla, MD  nitroGLYCERIN (NITROSTAT) 0.4 MG SL tablet Place 1 tablet (0.4 mg total) under the tongue every 5 (five) minutes as needed for chest pain (Up to 3 doses). 01/11/13  Yes Theora Gianotti,  NP  warfarin (COUMADIN) 2.5 MG tablet TAKE 1/2 TO 1 TABLET DAILY AS DIRECTED BY COUMADIN CLINIC. Patient taking differently: Take 1.25-2.5 mg by mouth daily. Take 1.25 mg on MWF and 2.5 mg on all other days 11/13/17  Yes Lelon Perla, MD  buPROPion [facility] SR) 150 MG 12 hr tablet TAKE 1 TABLET BY MOUTH TWICE A DAY Patient not taking: Reported on 11/27/2017 05/30/17   Lelon Perla, MD  famotidine (PEPCID) 20 MG tablet Take 1 tablet (20 mg total) by mouth 2 (two) times daily. 01/13/11 11/25/11  Liliane Shi, PA-C    Family History Family History  Problem Relation Age of Onset  . Heart disease Father        ALSO UNCLE DIED HAD CAD  . Heart failure Father   . Brain cancer Mother   . Leukemia Mother   . Sickle cell anemia Mother   . Sickle cell anemia Other     Social History Social History   Tobacco Use  . Smoking status: Current Some Day Smoker    Packs/day: 0.50    Years: 30.00    Pack  years: 15.00    Types: Cigarettes    Last attempt to quit: 03/12/2012    Years since quitting: 5.7  . Smokeless tobacco: Never Used  Substance Use Topics  . Alcohol use: No    Alcohol/week: 0.0 oz  . Drug use: No    Frequency: 2.0 times per week     Allergies   Patient has no known allergies.   Review of Systems Review of Systems  Gastrointestinal: Positive for vomiting.  All other systems reviewed and are negative.    Physical Exam Updated Vital Signs BP 139/82 (BP Location: Left Arm)   Pulse 63   Temp 97.7 F (36.5 C) (Oral)   Resp 17   Ht 6' 3.5" (1.918 m)   Wt 77.1 kg (170 lb)   SpO2 100%   BMI 20.97 kg/m   Physical Exam  Nursing note and vitals reviewed.  77 year old male, resting comfortably and in no acute distress. Vital signs are normal. Oxygen saturation is 100%, which is normal. Head is normocephalic and atraumatic. PERRLA, EOMI. Oropharynx is clear.  Mucous membranes appear dry. Neck is nontender and supple without adenopathy or JVD. Back is nontender and there is no CVA tenderness. Lungs are clear without rales, wheezes, or rhonchi. Chest is nontender. Heart has regular rate and rhythm without murmur. Abdomen is soft, flat, nontender without masses or hepatosplenomegaly and peristalsis is hypoactive. Extremities have no cyanosis or edema, full range of motion is present. Skin is warm and dry without rash. Neurologic: Mental status is normal, cranial nerves are intact, there are no motor or sensory deficits.  ED Treatments / Results  Labs (all labs ordered are listed, but only abnormal results are displayed) Labs Reviewed  COMPREHENSIVE METABOLIC PANEL - Abnormal; Notable for the following components:      Result Value   Potassium 3.3 (*)    Glucose, Bld 169 (*)    Creatinine, Ser 1.75 (*)    Total Protein 8.5 (*)    Alkaline Phosphatase 153 (*)    GFR calc non Af Amer 37 (*)    GFR calc Af Amer 42 (*)    All other components within normal  limits  URINALYSIS, ROUTINE W REFLEX MICROSCOPIC - Abnormal; Notable for the following components:   Hgb urine dipstick SMALL (*)    All other components within normal limits  TROPONIN I - Abnormal; Notable for the following components:   Troponin I 0.07 (*)    All other components within normal limits  TROPONIN I - Abnormal; Notable for the following components:   Troponin I 0.05 (*)    All other components within normal limits  PROTIME-INR - Abnormal; Notable for the following components:   Prothrombin Time 21.3 (*)    All other components within normal limits  LIPASE, BLOOD  CBC  DIFFERENTIAL    EKG EKG Interpretation  Date/Time:  Monday November 27 2017 02:31:15 EDT Ventricular Rate:  60 PR Interval:    QRS Duration: 136 QT Interval:  493 QTC Calculation: 493 R Axis:   82 Text Interpretation:  Atrial fibrillation LVH with secondary repolarization abnormality Anterior infarct, old ST depression, consider ischemia, diffuse lds When compared with ECG of 12/31/2012, HEART RATE has decreased ST depression today is very similar to prior ECG Confirmed by Delora Fuel ([id number]) on 11/27/2017 2:42:10 AM   Radiology No results found.  Procedures Procedures (including critical care time)  Medications Ordered in ED Medications  ondansetron (ZOFRAN) injection 4 mg (4 mg Intravenous Given 11/27/17 0240)  sodium chloride 0.9 % bolus 1,000 mL (0 mLs Intravenous Stopped 11/27/17 0620)     Initial Impression / Assessment and Plan / ED Course  I have reviewed the triage vital signs and the nursing notes.  Pertinent labs & imaging results that were available during my care of the patient were reviewed by me and considered in my medical decision making (see chart for details).  Nausea and vomiting which seems most likely be due to viral gastritis.  No history of abdominal surgery, so bowel obstruction is very unlikely.  He does have history of coronary artery disease, so we will check ECG and  troponin, but very much doubt ACS as cause of symptoms.  Old records are reviewed, and he is followed by cardiology for his CHF and atrial fibrillation.  Last INR was on February 26 and was 1.2.  He was supposed to have INR rechecked on March 12, but did not keep that appointment.  INR is up to 1.86.  Renal insufficiency is present and unchanged from baseline.  Elevated alkaline phosphatase is noted and also unchanged from baseline.  Troponin was mildly elevated.  Repeat troponin was obtained and actually was decreasing and this is not felt to be ACS.  Nausea is improved following ondansetron.  He was given an oral fluid challenge and was able to hold liquids down.  He was felt to be safe for discharge.  He is discharged with prescription for ondansetron.  Return precautions discussed.  Final Clinical Impressions(s) / ED Diagnoses   Final diagnoses:  Non-intractable vomiting with nausea, unspecified vomiting type  Renal insufficiency  Elevated serum alkaline phosphatase level    ED Discharge Orders        Ordered    ondansetron (ZOFRAN) 4 MG tablet  Every 6 hours PRN     48/18/56 3149       Delora Fuel, MD 70/26/37 [phone]

## 2018-01-07 ENCOUNTER — Other Ambulatory Visit: Payer: Self-pay | Admitting: Cardiology

## 2018-01-08 NOTE — Telephone Encounter (Signed)
REFILL 

## 2018-02-03 ENCOUNTER — Other Ambulatory Visit: Payer: Self-pay | Admitting: Cardiology

## 2018-02-06 ENCOUNTER — Other Ambulatory Visit: Payer: Self-pay | Admitting: Cardiology

## 2018-02-06 NOTE — Telephone Encounter (Signed)
Rx sent to pharmacy   

## 2018-02-07 ENCOUNTER — Other Ambulatory Visit: Payer: Self-pay | Admitting: Cardiology

## 2018-02-09 NOTE — Telephone Encounter (Signed)
LM with patient's son. Please call back ASAP   Need INR check prior to next warfarin refill

## 2018-02-20 NOTE — Telephone Encounter (Signed)
Patient answered call today but hanged up while I was talking.    Last seen at coumadin clinic on 10/31/17.  Patient was not show to appointment on 11/2017 and refuses to schedule new appointment.    Not appropriate to refill medication at this time.

## 2018-03-10 ENCOUNTER — Other Ambulatory Visit: Payer: Self-pay | Admitting: Cardiology

## 2018-03-12 NOTE — Telephone Encounter (Signed)
Rx sent to pharmacy   

## 2018-03-22 ENCOUNTER — Ambulatory Visit (INDEPENDENT_AMBULATORY_CARE_PROVIDER_SITE_OTHER): Payer: Medicare HMO | Admitting: Pharmacist

## 2018-03-22 DIAGNOSIS — I482 Chronic atrial fibrillation, unspecified: Secondary | ICD-10-CM

## 2018-03-22 DIAGNOSIS — Z5181 Encounter for therapeutic drug level monitoring: Secondary | ICD-10-CM | POA: Diagnosis not present

## 2018-03-22 LAB — POCT INR: INR: 1.1 — AB (ref 2.0–3.0)

## 2018-03-22 MED ORDER — WARFARIN SODIUM 2.5 MG PO TABS: ORAL_TABLET | ORAL | 0 refills | Status: DC

## 2018-03-22 NOTE — Patient Instructions (Signed)
Take 2 table today 7/18, and tomorrow 7/19 then continue taking 1/2 tablet each Monday, Wednesday and Friday, 1 tablet all other days. Repeat INR in 10 days

## 2018-04-04 ENCOUNTER — Other Ambulatory Visit: Payer: Self-pay | Admitting: Cardiology

## 2018-04-04 NOTE — Telephone Encounter (Signed)
Rx sent to pharmacy   

## 2018-04-06 ENCOUNTER — Other Ambulatory Visit: Payer: Self-pay | Admitting: Cardiovascular Disease

## 2018-05-02 NOTE — Progress Notes (Signed)
HPI: FU Atrial fibrillation, CAD, previous V fib arrest and ischemic CM. Patient with a history of PCI of his LAD in October 2004. He was admitted in June of 2013 following a ventricular fibrillation arrest that was felt related to hypokalemia (K 2.2). Patient improved following that episode. Patient admitted in April of 2014 following a ventricular fibrillation arrest. Cardiac catheterization in April of 2014 showed a patent stent in the LAD with a 40% in-stent restenosis. There was an apical 80% lesion. The circumflex was occluded. The right coronary artery was small and nondominant. The patient had attempt at PCI of the occluded circumflex but was unsuccessful. Echocardiogram in April of 2014 showed an ejection fraction of 30-35%, mild left atrial enlargement, moderately reduced RV function and mild mitral regurgitation. Patient was seen by Dr. Lovena Le of electrophysiology and ICD felt not indicated at that time because the patient did suffer a non-ST elevation myocardial infarction with positive enzymes. He was discharged with a life vest. Nuclear study January 2017 showed fixed inferior and apical defect but no ischemia. Patient had angiogram for peripheral vascular disease in April 2017 that showed an occluded right external iliac and common femoral artery not amenable to percutaneous intervention and an 80% left common femoral artery stenosis and an occluded left SFA also not amenable to percutaneous intervention. Treated medically.Echocardiogram repeated March 2018 and showed ejection fraction 30-35%, moderate left ventricular enlargement, moderate aortic insufficiency, severe mitral regurgitation, biatrial enlargement, mild right ventricular enlargement, mild tricuspid regurgitation.   Carotid Dopplers October 2018 showed 40 to 59% right and left stenosis.  Abdominal ultrasound February 2019 showed 3 cm abdominal aortic aneurysm.  Since last seen, the patient has dyspnea with more extreme  activities but not with routine activities. It is relieved with rest. It is not associated with chest pain. There is no orthopnea, PND or pedal edema. There is no syncope or palpitations. There is no exertional chest pain.   Current Outpatient Medications  Medication Sig Dispense Refill  . atorvastatin (LIPITOR) 80 MG tablet Take 1 tablet (80 mg total) by mouth daily. Please call office to make appointment for further refills. 90 tablet 0  . furosemide (LASIX) 20 MG tablet TAKE 2 TABLETS (40 MG TOTAL) BY MOUTH DAILY. 160 tablet 2  . hydrALAZINE (APRESOLINE) 10 MG tablet TAKE 1 TABLET BY MOUTH THREE TIMES A DAY 90 tablet 0  . KLOR-CON M20 20 MEQ tablet TAKE ONE TABLET BY MOUTH TWICE DAILY 60 tablet 2  . losartan (COZAAR) 50 MG tablet Take 1 tablet (50 mg total) by mouth daily. Pt needs appointment for further refills 60 tablet 0  . metoprolol succinate (TOPROL-XL) 100 MG 24 hr tablet Take 1 tablet (100 mg total) by mouth daily. Needs appointment for further refills. 90 tablet 0  . nitroGLYCERIN (NITROSTAT) 0.4 MG SL tablet Place 1 tablet (0.4 mg total) under the tongue every 5 (five) minutes as needed for chest pain (Up to 3 doses). 25 tablet 3  . ondansetron (ZOFRAN) 4 MG tablet Take 1 tablet (4 mg total) by mouth every 6 (six) hours as needed for nausea. 12 tablet 0  . warfarin (COUMADIN) 2.5 MG tablet TAKE 1/2 TO 1 TABLET DAILY AS DIRECTED BY COUMADIN CLINIC. 45 tablet 0   No current facility-administered medications for this visit.      Past Medical History:  Diagnosis Date  . AAA (abdominal aortic aneurysm) ([place])    a. 08/2015 Abd U/S: 2.7 x 2.9 x 3.2 cm infrarenal  AAA.  . Bradycardia    a. Requiring discontinuation of use of BB/CCB  . Cardiac arrest - ventricular fibrillation    a. 03/2012 in setting of hypokalemia (prolonged hosp with VDRF, tracheobronchitis, ARF, shock liver, PAF, AMS felt secondary to post-anoxic encephalopathy/shock).  . Carotid artery stenosis    a. 01/2011 -  40-59% bilateral stenosis;  b. 06/2015 Carotid U/S: 40-59% bilat ICA stenosis->f/u 6 mos.  . CKD (chronic kidney disease), stage III ([place])   . Coronary artery disease    a. s/p aborted ant STEMI tx with Cypher DES to LAD 10/04 (residual at cath: D1 50%, CFX 40% and multiple dist 70%, EF 55%);   b. myoview 3/10: Ef 47%, infero-apical isch, LOW RISK - med Tx recommended;  c. 12/2012 VF Arrest/Cath: LAD 40isr, 80 apical, LCX 100 (failed PTCA), RCA nondom.  . Diabetes mellitus   . Diverticulosis    2001  . Diverticulosis 2001  . Elevated LFTs    Shock liver 03/2012  . H/O: CVA (cardiovascular accident)   . Hematemesis    a. 12/2010 felt 2/2 Mallory Weiss tear - pt could not afford colonscopy/EGD at that time so Coumadin was deferred. Coumadin initiated 03/2012 without any evidence for bleeding.  Marland Kitchen History of Ischemic Cardiomyopathy    a. EF 50-55% by echo 03/09/12 (was 30% by echo 02/24/12); b. 03/2013 Echo: EF 55-60%, mild MR, sev dil LA, mod dil RA, PASP 35mmHg.  Marland Kitchen Hypertensive heart disease    a. echo 4/12: EF 50%, asymmetric septal hypertrophy, no SAM or LVOT gradient, LAE, PASP 35  . Inguinal hernia   . Inguinal hernia without mention of obstruction or gangrene, unilateral or unspecified, (not specified as recurrent)   . 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 Mitral regurgitation    a. Mild by echo 03/2012 and 03/2013.  . Mixed Hyperlipidemia   . Peripheral arterial disease ([place])   . Peripheral vascular disease ([place])    a. h/o LE angioplasty;  b. 08/2015 Duplex: >50% RCIA, 100% REIA, >50% LEIA, elev vel in SMA, patent IVC.  Marland Kitchen Persistent atrial fibrillation ([place])    a. Dx 12/2010-->coumadin (CHA2DS2VASc = 7).  . QT prolongation     Past Surgical History:  Procedure Laterality Date  . HERNIA REPAIR    . LEFT HEART CATH N/A 12/30/2012   Procedure: LEFT HEART CATH;  Surgeon: [name], MD;  Location: [facility] CATH LAB;  Service: Cardiovascular;  Laterality: N/A;   . ORIF TIBIA & FIBULA FRACTURES  01/11/07   OPEN TX OF UNICONDYLAR PLATEAU FRACTURE, IRRIGATION/DEBRIDEMENT OF OPEN FRACTURE INCLUDING BONE, REMOVAL OF EXTERNAL FIXATOR UNDER ANESTHESIA PER DR. MICHAEL HANDY  . PERIPHERAL VASCULAR CATHETERIZATION N/A 12/21/2015   Procedure: Lower Extremity Angiography;  Surgeon: [name], MD;  Location: [facility] CV LAB;  Service: Cardiovascular;  Laterality: N/A;  . POPLITEAL ARTERY ANGIOPLASTY  01/07/07   s/p LEFT POPLITEAL ARTERY EXPLORATION AND VEIN PATCH ANGIOPLASTY, PER DR. EARLY, SECONDARY TO ISCHEMIC LEFT FOOT RELATED TO LEFT POPLITEAL ARTERY INJURY  . SKIN GRAFT      Social History   Socioeconomic History  . Marital status: Married    Spouse name: Not on file  . Number of children: 4  . Years of education: Not on file  . Highest education level: Not on file  Occupational History  . Occupation: FOREMAN FOR A [employer]    Employer: [employer]  . Financial resource strain: Not on file  . Food  insecurity:    Worry: Not on file    Inability: Not on file  . Transportation needs:    Medical: Not on file    Non-medical: Not on file  Tobacco Use  . Smoking status: Current Some Day Smoker    Packs/day: 0.50    Years: 30.00    Pack years: 15.00    Types: Cigarettes    Last attempt to quit: 03/12/2012    Years since quitting: 6.1  . Smokeless tobacco: Never Used  Substance and Sexual Activity  . Alcohol use: No    Alcohol/week: 0.0 standard drinks  . Drug use: No    Frequency: 2.0 times per week  . Sexual activity: Not on file  Lifestyle  . Physical activity:    Days per week: Not on file    Minutes per session: Not on file  . Stress: Not on file  Relationships  . Social connections:    Talks on phone: Not on file    Gets together: Not on file    Attends religious service: Not on file    Active member of club or organization: Not on file    Attends meetings of clubs or organizations: Not on file     Relationship status: Not on file  . Intimate partner violence:    Fear of current or ex partner: Not on file    Emotionally abused: Not on file    Physically abused: Not on file    Forced sexual activity: Not on file  Other Topics Concern  . 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 YES. 1/2 -1 PPD OF CIGARETTES    NO ETOH    Family History  Problem Relation Age of Onset  . Heart disease Father        ALSO UNCLE DIED HAD CAD  . Heart failure Father   . Brain cancer Mother   . Leukemia Mother   . Sickle cell anemia Mother   . Sickle cell anemia Other     ROS: no fevers or chills, productive cough, hemoptysis, dysphasia, odynophagia, melena, hematochezia, dysuria, hematuria, rash, seizure activity, orthopnea, PND, pedal edema, claudication. Remaining systems are negative.  Physical Exam: Well-developed well-nourished in no acute distress.  Skin is warm and dry.  HEENT is normal.  Neck is supple.  Chest is clear to auscultation with normal expansion.  Cardiovascular exam is irregular Abdominal exam nontender or distended. No masses palpated. Extremities show no edema. neuro grossly intact   A/P  1 permanent atrial fibrillation-plan to continue beta-blocker for rate control.  Discontinue Coumadin.  Instead we will treat with apixaban 5 mg twice daily.  Check hemoglobin and renal function in 4 weeks.  2 chronic combined systolic/diastolic congestive heart failure-patient appears to be euvolemic on examination.  Continue present dose of diuretic.  Check potassium and renal function.  3 coronary artery disease-plan to continue medical therapy.  He is not on aspirin given need for anticoagulation.  Continue statin.  4 ischemic cardiomyopathy-continue beta-blocker.  Continue cozaar. Repeat echocardiogram.  If ejection fraction less than 35% would consider ICD.  5 carotid  artery disease-schedule follow-up carotid Dopplers 10/19.  6 abdominal aortic aneurysm-plan repeat ultrasound February 2020.  7 chronic stage III kidney disease-check potassium and renal function.  8 peripheral vascular disease-continue statin.  No claudication.  9 hypertension-blood pressure is  controlled.  Plan as outlined above.  10 tobacco abuse-patient counseled on discontinuing.  11 hyperlipidemia-check lipids and liver.  Continue statin.  12 mitral regurgitation-plan repeat echocardiogram.  Kirk Ruths, MD

## 2018-05-04 ENCOUNTER — Ambulatory Visit (INDEPENDENT_AMBULATORY_CARE_PROVIDER_SITE_OTHER): Payer: Medicare HMO | Admitting: Pharmacist

## 2018-05-04 ENCOUNTER — Ambulatory Visit (INDEPENDENT_AMBULATORY_CARE_PROVIDER_SITE_OTHER): Payer: Medicare HMO | Admitting: Cardiology

## 2018-05-04 ENCOUNTER — Encounter: Payer: Self-pay | Admitting: Cardiology

## 2018-05-04 VITALS — BP 116/60 | HR 60 | Ht 75.5 in | Wt 174.0 lb

## 2018-05-04 DIAGNOSIS — Z7901 Long term (current) use of anticoagulants: Secondary | ICD-10-CM

## 2018-05-04 DIAGNOSIS — I482 Chronic atrial fibrillation, unspecified: Secondary | ICD-10-CM

## 2018-05-04 DIAGNOSIS — I428 Other cardiomyopathies: Secondary | ICD-10-CM | POA: Diagnosis not present

## 2018-05-04 DIAGNOSIS — I10 Essential (primary) hypertension: Secondary | ICD-10-CM

## 2018-05-04 DIAGNOSIS — E1151 Type 2 diabetes mellitus with diabetic peripheral angiopathy without gangrene: Secondary | ICD-10-CM | POA: Diagnosis not present

## 2018-05-04 DIAGNOSIS — I2510 Atherosclerotic heart disease of native coronary artery without angina pectoris: Secondary | ICD-10-CM

## 2018-05-04 DIAGNOSIS — I679 Cerebrovascular disease, unspecified: Secondary | ICD-10-CM

## 2018-05-04 DIAGNOSIS — I5042 Chronic combined systolic (congestive) and diastolic (congestive) heart failure: Secondary | ICD-10-CM | POA: Diagnosis not present

## 2018-05-04 DIAGNOSIS — I714 Abdominal aortic aneurysm, without rupture: Secondary | ICD-10-CM | POA: Diagnosis not present

## 2018-05-04 DIAGNOSIS — Z5181 Encounter for therapeutic drug level monitoring: Secondary | ICD-10-CM

## 2018-05-04 DIAGNOSIS — E7800 Pure hypercholesterolemia, unspecified: Secondary | ICD-10-CM

## 2018-05-04 DIAGNOSIS — I4821 Permanent atrial fibrillation: Secondary | ICD-10-CM

## 2018-05-04 LAB — POCT INR: INR: 1.1 — AB (ref 2.0–3.0)

## 2018-05-04 MED ORDER — APIXABAN 5 MG PO TABS: 5.0000 mg | ORAL_TABLET | Freq: Two times a day (BID) | ORAL | 6 refills | Status: DC

## 2018-05-04 NOTE — Patient Instructions (Addendum)
Medication Instructions:   STOP WARFARIN  START ELIQUIS 5 MG ONE TABLET TWICE DAILY AS DIRECTED BY PHARM MD  Labwork:  Your physician recommends that you return for lab work IN 4 WEEKS PRIOR TO EATING  Testing/Procedures:  Your physician has requested that you have a carotid duplex. This test is an ultrasound of the carotid arteries in your neck. It looks at blood flow through these arteries that supply the brain with blood. Allow one hour for this exam. There are no restrictions or special instructions.    Your physician has requested that you have an echocardiogram. Echocardiography is a painless test that uses sound waves to create images of your heart. It provides your doctor with information about the size and shape of your heart and how well your heart's chambers and valves are working. This procedure takes approximately one hour. There are no restrictions for this procedure.   Follow-Up:  Your physician wants you to follow-up in: [place] will receive a reminder letter in the mail two months in advance. If you don't receive a letter, please call our office to schedule the follow-up appointment.   If you need a refill on your cardiac medications before your next appointment, please call your pharmacy.

## 2018-05-16 ENCOUNTER — Ambulatory Visit (HOSPITAL_COMMUNITY): Payer: Medicare HMO | Attending: Cardiology

## 2018-05-16 ENCOUNTER — Other Ambulatory Visit: Payer: Self-pay | Admitting: Cardiology

## 2018-05-16 NOTE — Telephone Encounter (Signed)
Rx request sent to pharmacy.  

## 2018-05-17 ENCOUNTER — Other Ambulatory Visit: Payer: Self-pay | Admitting: Cardiology

## 2018-05-29 ENCOUNTER — Other Ambulatory Visit: Payer: Self-pay

## 2018-05-29 ENCOUNTER — Ambulatory Visit (HOSPITAL_COMMUNITY): Payer: Medicare HMO | Attending: Cardiology

## 2018-05-29 DIAGNOSIS — I428 Other cardiomyopathies: Secondary | ICD-10-CM

## 2018-05-29 DIAGNOSIS — I083 Combined rheumatic disorders of mitral, aortic and tricuspid valves: Secondary | ICD-10-CM | POA: Diagnosis not present

## 2018-06-02 ENCOUNTER — Other Ambulatory Visit: Payer: Self-pay | Admitting: Cardiology

## 2018-06-08 ENCOUNTER — Telehealth: Payer: Self-pay | Admitting: Cardiology

## 2018-06-08 DIAGNOSIS — I4821 Permanent atrial fibrillation: Secondary | ICD-10-CM

## 2018-06-08 MED ORDER — VALSARTAN 80 MG PO TABS: 80.0000 mg | ORAL_TABLET | Freq: Every day | ORAL | 6 refills | Status: DC

## 2018-06-08 MED ORDER — APIXABAN 5 MG PO TABS: 5.0000 mg | ORAL_TABLET | Freq: Two times a day (BID) | ORAL | 6 refills | Status: DC

## 2018-06-08 NOTE — Telephone Encounter (Signed)
Returned call to [facility]ist at [facility] [employer] [facility].She was calling to verify if patient is to be taking coumadin and eliquis.Advised at last office visit with [facility] 05/04/18.Coumadin was stopped and Eliquis 5 mg twice a day started.Also she needed to know directions for Valsartan.Advised 80 mg daily.

## 2018-06-08 NOTE — Telephone Encounter (Signed)
New message   Pt c/o medication issue:  1. Name of Medication: eliquis , warfarin, valsartan   2. How are you currently taking this medication (dosage and times per day)? Valsartan: never taken,eliquis: one tablet by mouth twice a day  warfarin: 1/2 to one tablet daily as directed by coumadin clinic   3. Are you having a reaction (difficulty breathing--STAT)? No   4. What is your medication issue? Is patient supposed to be taking eliquis and warfarin? Per pharmacy need to know the quantity, directions and if any refills on Valsartan? Please advise.

## 2018-06-11 ENCOUNTER — Encounter: Payer: Self-pay | Admitting: *Deleted

## 2018-06-12 ENCOUNTER — Telehealth: Payer: Self-pay | Admitting: Cardiology

## 2018-06-12 NOTE — Telephone Encounter (Signed)
New Message         Sidra from [facility] is calling for the above patient she has questions about, (Valsartan 80 mg)

## 2018-06-12 NOTE — Telephone Encounter (Signed)
Sidra with [facility] called to report that since the recall her other manufacturers are on back order for the Valsartan 80mg .. They were asking to switch the pt to Losartan 25mg  po bid.. It is the only med left that is not recalled or on backorder due to the recall. Will forward to DrMarland Kitchen [name] for further advice.

## 2018-06-12 NOTE — Telephone Encounter (Signed)
DC valsartan; cozaar 50 mg daily Kirk Ruths

## 2018-06-13 MED ORDER — LOSARTAN POTASSIUM 50 MG PO TABS: 50.0000 mg | ORAL_TABLET | Freq: Every day | ORAL | 3 refills | Status: DC

## 2018-06-13 NOTE — Telephone Encounter (Signed)
New script sent to the pharmacy

## 2018-06-25 DIAGNOSIS — E7800 Pure hypercholesterolemia, unspecified: Secondary | ICD-10-CM | POA: Diagnosis not present

## 2018-06-25 DIAGNOSIS — I482 Chronic atrial fibrillation, unspecified: Secondary | ICD-10-CM | POA: Diagnosis not present

## 2018-06-26 ENCOUNTER — Ambulatory Visit (HOSPITAL_COMMUNITY)
Admission: RE | Admit: 2018-06-26 | Payer: Medicare HMO | Source: Ambulatory Visit | Attending: Cardiology | Admitting: Cardiology

## 2018-06-26 ENCOUNTER — Encounter: Payer: Self-pay | Admitting: *Deleted

## 2018-06-26 LAB — BASIC METABOLIC PANEL
BUN / CREAT RATIO: 14 (ref 10–24)
BUN: 23 mg/dL (ref 8–27)
CO2: 17 mmol/L — ABNORMAL LOW (ref 20–29)
CREATININE: 1.65 mg/dL — AB (ref 0.76–1.27)
Calcium: 9.6 mg/dL (ref 8.6–10.2)
Chloride: 106 mmol/L (ref 96–106)
GFR calc non Af Amer: 40 mL/min/{1.73_m2} — ABNORMAL LOW (ref >59)
GFR, EST AFRICAN AMERICAN: 46 mL/min/{1.73_m2} — AB (ref >59)
GLUCOSE: 95 mg/dL (ref 65–99)
POTASSIUM: 4.9 mmol/L (ref 3.5–5.2)
Sodium: 140 mmol/L (ref 134–144)

## 2018-06-26 LAB — LIPID PANEL
CHOLESTEROL TOTAL: 127 mg/dL (ref 100–199)
Chol/HDL Ratio: 4.2 ratio (ref 0.0–5.0)
HDL: 30 mg/dL — ABNORMAL LOW (ref >39)
LDL Calculated: 74 mg/dL (ref 0–99)
Triglycerides: 117 mg/dL (ref 0–149)
VLDL CHOLESTEROL CAL: 23 mg/dL (ref 5–40)

## 2018-06-26 LAB — CBC
Hematocrit: 45.5 % (ref 37.5–51.0)
Hemoglobin: 14.9 g/dL (ref 13.0–17.7)
MCH: 26.5 pg — ABNORMAL LOW (ref 26.6–33.0)
MCHC: 32.7 g/dL (ref 31.5–35.7)
MCV: 81 fL (ref 79–97)
PLATELETS: 213 10*3/uL (ref 150–450)
RBC: 5.63 x10E6/uL (ref 4.14–5.80)
RDW: 15.5 % — ABNORMAL HIGH (ref 12.3–15.4)
WBC: 5.5 10*3/uL (ref 3.4–10.8)

## 2018-06-26 LAB — HEPATIC FUNCTION PANEL
ALBUMIN: 4.3 g/dL (ref 3.5–4.8)
ALK PHOS: 154 IU/L — AB (ref 39–117)
ALT: 21 IU/L (ref 0–44)
AST: 30 IU/L (ref 0–40)
BILIRUBIN TOTAL: 0.9 mg/dL (ref 0.0–1.2)
BILIRUBIN, DIRECT: 0.2 mg/dL (ref 0.00–0.40)
TOTAL PROTEIN: 7.1 g/dL (ref 6.0–8.5)

## 2018-07-01 ENCOUNTER — Other Ambulatory Visit: Payer: Self-pay | Admitting: Cardiology

## 2018-07-02 ENCOUNTER — Other Ambulatory Visit: Payer: Self-pay | Admitting: Cardiology

## 2018-07-02 NOTE — Telephone Encounter (Signed)
Rx request sent to pharmacy.  

## 2018-07-11 ENCOUNTER — Other Ambulatory Visit: Payer: Self-pay | Admitting: Cardiology

## 2018-07-13 ENCOUNTER — Other Ambulatory Visit: Payer: Self-pay | Admitting: Cardiology

## 2018-07-17 ENCOUNTER — Ambulatory Visit (HOSPITAL_COMMUNITY)
Admission: RE | Admit: 2018-07-17 | Payer: Medicare HMO | Source: Ambulatory Visit | Attending: Cardiology | Admitting: Cardiology

## 2018-07-18 ENCOUNTER — Encounter (HOSPITAL_COMMUNITY): Payer: Self-pay | Admitting: Cardiology

## 2018-07-31 ENCOUNTER — Ambulatory Visit (HOSPITAL_COMMUNITY)
Admission: RE | Admit: 2018-07-31 | Discharge: 2018-07-31 | Disposition: A | Discharge status: Home / Self Care | Payer: Medicare HMO | Source: Ambulatory Visit | Attending: Internal Medicine | Admitting: Internal Medicine

## 2018-07-31 DIAGNOSIS — I679 Cerebrovascular disease, unspecified: Secondary | ICD-10-CM | POA: Insufficient documentation

## 2018-08-05 ENCOUNTER — Other Ambulatory Visit: Payer: Self-pay | Admitting: Cardiology

## 2018-08-06 ENCOUNTER — Other Ambulatory Visit: Payer: Self-pay | Admitting: *Deleted

## 2018-08-06 DIAGNOSIS — I679 Cerebrovascular disease, unspecified: Secondary | ICD-10-CM

## 2018-10-08 ENCOUNTER — Other Ambulatory Visit: Payer: Self-pay | Admitting: Cardiology

## 2018-10-13 ENCOUNTER — Other Ambulatory Visit: Payer: Self-pay | Admitting: Cardiology

## 2018-10-15 ENCOUNTER — Other Ambulatory Visit: Payer: Self-pay | Admitting: *Deleted

## 2018-10-15 DIAGNOSIS — I714 Abdominal aortic aneurysm, without rupture, unspecified: Secondary | ICD-10-CM

## 2018-11-08 ENCOUNTER — Ambulatory Visit (HOSPITAL_COMMUNITY)
Admission: RE | Admit: 2018-11-08 | Payer: Medicare HMO | Source: Ambulatory Visit | Attending: Cardiology | Admitting: Cardiology

## 2018-11-24 ENCOUNTER — Other Ambulatory Visit: Payer: Self-pay | Admitting: Cardiology

## 2018-12-06 ENCOUNTER — Other Ambulatory Visit (HOSPITAL_COMMUNITY): Payer: Medicare HMO

## 2018-12-26 ENCOUNTER — Telehealth: Payer: Self-pay | Admitting: *Deleted

## 2018-12-26 NOTE — Telephone Encounter (Signed)
12/26/18 LMOM @ 1:42 pm, re: follow up appointment.

## 2019-01-14 ENCOUNTER — Other Ambulatory Visit (HOSPITAL_COMMUNITY): Payer: Medicare HMO

## 2019-02-07 ENCOUNTER — Other Ambulatory Visit: Payer: Self-pay | Admitting: Cardiology

## 2019-02-08 ENCOUNTER — Other Ambulatory Visit: Payer: Self-pay | Admitting: Cardiology

## 2019-02-21 ENCOUNTER — Other Ambulatory Visit: Payer: Self-pay | Admitting: Cardiology

## 2019-02-22 ENCOUNTER — Other Ambulatory Visit: Payer: Self-pay | Admitting: Cardiology

## 2019-03-15 ENCOUNTER — Ambulatory Visit (HOSPITAL_COMMUNITY)
Admission: RE | Admit: 2019-03-15 | Discharge: 2019-03-15 | Disposition: A | Discharge status: Home / Self Care | Payer: Medicare HMO | Source: Ambulatory Visit | Attending: Internal Medicine | Admitting: Internal Medicine

## 2019-03-15 ENCOUNTER — Other Ambulatory Visit (HOSPITAL_COMMUNITY): Payer: Self-pay | Admitting: Cardiology

## 2019-03-15 ENCOUNTER — Other Ambulatory Visit: Payer: Self-pay | Admitting: *Deleted

## 2019-03-15 ENCOUNTER — Other Ambulatory Visit: Payer: Self-pay

## 2019-03-15 DIAGNOSIS — I714 Abdominal aortic aneurysm, without rupture, unspecified: Secondary | ICD-10-CM

## 2019-03-15 DIAGNOSIS — I739 Peripheral vascular disease, unspecified: Secondary | ICD-10-CM

## 2019-03-31 ENCOUNTER — Other Ambulatory Visit: Payer: Self-pay | Admitting: Cardiology

## 2019-05-06 ENCOUNTER — Other Ambulatory Visit: Payer: Self-pay | Admitting: Cardiology

## 2019-05-07 ENCOUNTER — Other Ambulatory Visit: Payer: Self-pay | Admitting: Cardiology

## 2019-05-07 NOTE — Telephone Encounter (Signed)
This is Dr. Crenshaw's pt 

## 2019-05-10 ENCOUNTER — Other Ambulatory Visit: Payer: Self-pay | Admitting: Cardiology

## 2019-06-02 ENCOUNTER — Other Ambulatory Visit: Payer: Self-pay | Admitting: Cardiology

## 2019-06-19 ENCOUNTER — Telehealth: Payer: Self-pay | Admitting: Cardiology

## 2019-06-19 NOTE — Telephone Encounter (Signed)
lm2cb to discuss swelling

## 2019-06-19 NOTE — Telephone Encounter (Signed)
New message   Pt c/o swelling: STAT is pt has developed SOB within 24 hours  1) How much weight have you gained and in what time span? No   2) If swelling, where is the swelling located?in both feet and ankles   3) Are you currently taking a fluid pill?yes   4) Are you currently SOB? Yes   5) Do you have a log of your daily weights (if so, list)? No   6) Have you gained 3 pounds in a day or 5 pounds in a week? No   7) Have you traveled recently?Per patient's wife went to see son in [place] within the last week.

## 2019-06-22 ENCOUNTER — Inpatient Hospital Stay (HOSPITAL_BASED_OUTPATIENT_CLINIC_OR_DEPARTMENT_OTHER)
Admission: EM | Admit: 2019-06-22 | Discharge: 2019-06-24 | DRG: 291 | Disposition: A | Discharge status: Home / Self Care | Payer: Medicare HMO | Attending: Internal Medicine | Admitting: Internal Medicine

## 2019-06-22 ENCOUNTER — Other Ambulatory Visit: Payer: Self-pay

## 2019-06-22 ENCOUNTER — Emergency Department (HOSPITAL_BASED_OUTPATIENT_CLINIC_OR_DEPARTMENT_OTHER): Payer: Medicare HMO

## 2019-06-22 ENCOUNTER — Encounter (HOSPITAL_BASED_OUTPATIENT_CLINIC_OR_DEPARTMENT_OTHER): Payer: Self-pay | Admitting: Adult Health

## 2019-06-22 DIAGNOSIS — I340 Nonrheumatic mitral (valve) insufficiency: Secondary | ICD-10-CM | POA: Diagnosis present

## 2019-06-22 DIAGNOSIS — J9601 Acute respiratory failure with hypoxia: Secondary | ICD-10-CM | POA: Diagnosis not present

## 2019-06-22 DIAGNOSIS — E782 Mixed hyperlipidemia: Secondary | ICD-10-CM | POA: Diagnosis present

## 2019-06-22 DIAGNOSIS — I4892 Unspecified atrial flutter: Secondary | ICD-10-CM | POA: Diagnosis present

## 2019-06-22 DIAGNOSIS — Z79899 Other long term (current) drug therapy: Secondary | ICD-10-CM

## 2019-06-22 DIAGNOSIS — I110 Hypertensive heart disease with heart failure: Secondary | ICD-10-CM | POA: Diagnosis not present

## 2019-06-22 DIAGNOSIS — I272 Pulmonary hypertension, unspecified: Secondary | ICD-10-CM | POA: Diagnosis present

## 2019-06-22 DIAGNOSIS — M7989 Other specified soft tissue disorders: Secondary | ICD-10-CM | POA: Diagnosis present

## 2019-06-22 DIAGNOSIS — Z20828 Contact with and (suspected) exposure to other viral communicable diseases: Secondary | ICD-10-CM | POA: Diagnosis present

## 2019-06-22 DIAGNOSIS — R0902 Hypoxemia: Secondary | ICD-10-CM | POA: Diagnosis not present

## 2019-06-22 DIAGNOSIS — I5023 Acute on chronic systolic (congestive) heart failure: Secondary | ICD-10-CM | POA: Diagnosis present

## 2019-06-22 DIAGNOSIS — Z808 Family history of malignant neoplasm of other organs or systems: Secondary | ICD-10-CM

## 2019-06-22 DIAGNOSIS — I255 Ischemic cardiomyopathy: Secondary | ICD-10-CM | POA: Diagnosis present

## 2019-06-22 DIAGNOSIS — I361 Nonrheumatic tricuspid (valve) insufficiency: Secondary | ICD-10-CM | POA: Diagnosis not present

## 2019-06-22 DIAGNOSIS — I130 Hypertensive heart and chronic kidney disease with heart failure and stage 1 through stage 4 chronic kidney disease, or unspecified chronic kidney disease: Principal | ICD-10-CM | POA: Diagnosis present

## 2019-06-22 DIAGNOSIS — E1151 Type 2 diabetes mellitus with diabetic peripheral angiopathy without gangrene: Secondary | ICD-10-CM | POA: Diagnosis present

## 2019-06-22 DIAGNOSIS — I252 Old myocardial infarction: Secondary | ICD-10-CM

## 2019-06-22 DIAGNOSIS — I2510 Atherosclerotic heart disease of native coronary artery without angina pectoris: Secondary | ICD-10-CM | POA: Diagnosis present

## 2019-06-22 DIAGNOSIS — E1122 Type 2 diabetes mellitus with diabetic chronic kidney disease: Secondary | ICD-10-CM | POA: Diagnosis present

## 2019-06-22 DIAGNOSIS — I4891 Unspecified atrial fibrillation: Secondary | ICD-10-CM | POA: Diagnosis not present

## 2019-06-22 DIAGNOSIS — Z955 Presence of coronary angioplasty implant and graft: Secondary | ICD-10-CM | POA: Diagnosis not present

## 2019-06-22 DIAGNOSIS — N183 Chronic kidney disease, stage 3 unspecified: Secondary | ICD-10-CM | POA: Diagnosis not present

## 2019-06-22 DIAGNOSIS — Z7901 Long term (current) use of anticoagulants: Secondary | ICD-10-CM | POA: Diagnosis not present

## 2019-06-22 DIAGNOSIS — M109 Gout, unspecified: Secondary | ICD-10-CM | POA: Diagnosis present

## 2019-06-22 DIAGNOSIS — I714 Abdominal aortic aneurysm, without rupture, unspecified: Secondary | ICD-10-CM | POA: Diagnosis present

## 2019-06-22 DIAGNOSIS — D509 Iron deficiency anemia, unspecified: Secondary | ICD-10-CM | POA: Diagnosis not present

## 2019-06-22 DIAGNOSIS — F17200 Nicotine dependence, unspecified, uncomplicated: Secondary | ICD-10-CM | POA: Diagnosis not present

## 2019-06-22 DIAGNOSIS — Z806 Family history of leukemia: Secondary | ICD-10-CM

## 2019-06-22 DIAGNOSIS — I10 Essential (primary) hypertension: Secondary | ICD-10-CM | POA: Diagnosis not present

## 2019-06-22 DIAGNOSIS — I4821 Permanent atrial fibrillation: Secondary | ICD-10-CM | POA: Diagnosis not present

## 2019-06-22 DIAGNOSIS — Z8673 Personal history of transient ischemic attack (TIA), and cerebral infarction without residual deficits: Secondary | ICD-10-CM

## 2019-06-22 DIAGNOSIS — Z8249 Family history of ischemic heart disease and other diseases of the circulatory system: Secondary | ICD-10-CM

## 2019-06-22 DIAGNOSIS — J209 Acute bronchitis, unspecified: Secondary | ICD-10-CM | POA: Diagnosis present

## 2019-06-22 DIAGNOSIS — I6523 Occlusion and stenosis of bilateral carotid arteries: Secondary | ICD-10-CM | POA: Diagnosis present

## 2019-06-22 DIAGNOSIS — I4819 Other persistent atrial fibrillation: Secondary | ICD-10-CM | POA: Diagnosis not present

## 2019-06-22 DIAGNOSIS — R0602 Shortness of breath: Secondary | ICD-10-CM | POA: Diagnosis not present

## 2019-06-22 DIAGNOSIS — F17210 Nicotine dependence, cigarettes, uncomplicated: Secondary | ICD-10-CM | POA: Diagnosis present

## 2019-06-22 DIAGNOSIS — Z832 Family history of diseases of the blood and blood-forming organs and certain disorders involving the immune mechanism: Secondary | ICD-10-CM

## 2019-06-22 DIAGNOSIS — J9602 Acute respiratory failure with hypercapnia: Secondary | ICD-10-CM | POA: Diagnosis not present

## 2019-06-22 LAB — BRAIN NATRIURETIC PEPTIDE: B Natriuretic Peptide: 1512.4 pg/mL — ABNORMAL HIGH (ref 0.0–100.0)

## 2019-06-22 LAB — CBC WITH DIFFERENTIAL/PLATELET
Abs Immature Granulocytes: 0.01 10*3/uL (ref 0.00–0.07)
Basophils Absolute: 0 10*3/uL (ref 0.0–0.1)
Basophils Relative: 1 %
Eosinophils Absolute: 0.1 10*3/uL (ref 0.0–0.5)
Eosinophils Relative: 2 %
HCT: 36.3 % — ABNORMAL LOW (ref 39.0–52.0)
Hemoglobin: 12.5 g/dL — ABNORMAL LOW (ref 13.0–17.0)
Immature Granulocytes: 0 %
Lymphocytes Relative: 42 %
Lymphs Abs: 2.5 10*3/uL (ref 0.7–4.0)
MCH: 26.9 pg (ref 26.0–34.0)
MCHC: 34.4 g/dL (ref 30.0–36.0)
MCV: 78.1 fL — ABNORMAL LOW (ref 80.0–100.0)
Monocytes Absolute: 0.7 10*3/uL (ref 0.1–1.0)
Monocytes Relative: 12 %
Neutro Abs: 2.6 10*3/uL (ref 1.7–7.7)
Neutrophils Relative %: 43 %
Platelets: 251 10*3/uL (ref 150–400)
RBC: 4.65 MIL/uL (ref 4.22–5.81)
RDW: 15.1 % (ref 11.5–15.5)
WBC: 6.1 10*3/uL (ref 4.0–10.5)
nRBC: 0 % (ref 0.0–0.2)

## 2019-06-22 LAB — COMPREHENSIVE METABOLIC PANEL
ALT: 39 U/L (ref 0–44)
AST: 49 U/L — ABNORMAL HIGH (ref 15–41)
Albumin: 3.4 g/dL — ABNORMAL LOW (ref 3.5–5.0)
Alkaline Phosphatase: 146 U/L — ABNORMAL HIGH (ref 38–126)
Anion gap: 10 (ref 5–15)
BUN: 32 mg/dL — ABNORMAL HIGH (ref 8–23)
CO2: 23 mmol/L (ref 22–32)
Calcium: 8.7 mg/dL — ABNORMAL LOW (ref 8.9–10.3)
Chloride: 103 mmol/L (ref 98–111)
Creatinine, Ser: 1.56 mg/dL — ABNORMAL HIGH (ref 0.61–1.24)
GFR calc Af Amer: 49 mL/min — ABNORMAL LOW (ref >60)
GFR calc non Af Amer: 43 mL/min — ABNORMAL LOW (ref >60)
Glucose, Bld: 103 mg/dL — ABNORMAL HIGH (ref 70–99)
Potassium: 4.4 mmol/L (ref 3.5–5.1)
Sodium: 136 mmol/L (ref 135–145)
Total Bilirubin: 1.5 mg/dL — ABNORMAL HIGH (ref 0.3–1.2)
Total Protein: 6.9 g/dL (ref 6.5–8.1)

## 2019-06-22 LAB — PROTIME-INR
INR: 1.5 — ABNORMAL HIGH (ref 0.8–1.2)
Prothrombin Time: 17.7 seconds — ABNORMAL HIGH (ref 11.4–15.2)

## 2019-06-22 LAB — TROPONIN I (HIGH SENSITIVITY)
Troponin I (High Sensitivity): 18 ng/L — ABNORMAL HIGH (ref <18)
Troponin I (High Sensitivity): 17 ng/L (ref <18)

## 2019-06-22 MED ORDER — ALBUTEROL SULFATE HFA 108 (90 BASE) MCG/ACT IN AERS
2.0000 | INHALATION_SPRAY | Freq: Once | RESPIRATORY_TRACT | Status: AC
  Administered 2019-06-22: 2 via RESPIRATORY_TRACT
  Filled 2019-06-22: qty 6.7

## 2019-06-22 MED ORDER — METHYLPREDNISOLONE SODIUM SUCC 125 MG IJ SOLR
125.0000 mg | Freq: Once | INTRAMUSCULAR | Status: AC
  Administered 2019-06-22: 125 mg via INTRAVENOUS
  Filled 2019-06-22: qty 2

## 2019-06-22 MED ORDER — FUROSEMIDE 10 MG/ML IJ SOLN
40.0000 mg | Freq: Once | INTRAMUSCULAR | Status: AC
  Administered 2019-06-22: 20:00:00 40 mg via INTRAVENOUS
  Filled 2019-06-22: qty 4

## 2019-06-22 NOTE — ED Triage Notes (Signed)
Pt has multiple cardiac problems and has had 2 cardiac arrests. HE began having SOB worse with lying down at night one week ago, he has a productive cough, denies fevers. Denies pain.

## 2019-06-22 NOTE — Care Management (Signed)
This is a no charge note  Transfer from [facility] per Dr. Darl Householder  78 year old man with a past medical history of hypertension, hyperlipidemia, diabetes mellitus, stroke, gout, atrial fibrillation on Eliquis, PVD, mitral valve regurgitation, CAD, DES placement, CKD stage III, AAA, history of cardiac arrest due to V. fib, CHF with EF of 35-40%, tobacco abuse, who presents with shortness of breath and leg edema for more than 1 week.  Patient was found to have BNP 1512, WBC 6.1, troponin 15, INR 1.5, pending COVID-19 test, renal function close to baseline, liver function (ALP 146, AST 49, ALT 39, total bilirubin 1.5), temperature 97.5, blood pressure 108/73, heart rate 70 to 80s, oxygen saturation 85% on room air.  Chest x-ray showed central opacity which is likely due to edema.  Clinically consistent with CHF exacerbation.  ED physician states that patient also has mild wheezing.  Patient is admitted to telemetry bed as inpatient.  40 mg of Lasix and 125 mg of Solu-Medrol were given in ED.   Please call manager of [facility] at [phone] when pt arrives to floor   Ivor Costa, MD  [facility]   If 7PM-7AM, please contact night-coverage [email or web address] Password [facility] 06/22/2019, 8:54 PM

## 2019-06-22 NOTE — ED Provider Notes (Signed)
[facility] EMERGENCY DEPARTMENT Provider Note   CSN: [id number] Arrival date & time: 06/22/19  1814     History   Chief Complaint Chief Complaint  Patient presents with  . Shortness of Breath    HPI Logan White is a 78 y.o. male hx of CAD s/p stent, V fib arrest, CHF with EF of 35%, who presented with shortness of breath.  Patient has been having worsening shortness of breath over the last 2 weeks.  He states that he noticed shortness of breath mostly at nighttime.  He states that he had to sleep on 2 pillows instead of 1.  He denies any chest pain or exertional shortness of breath.  States that his weight is stable. He had gout attack about 2 weeks ago but right big toe still swollen but his leg is not swollen. He is compliant with his eliquis as prescribed.  Patient states that he has some nonproductive cough but denies any fevers.  He adamantly denies any exposure to COVID.     The history is provided by the patient.    Past Medical History:  Diagnosis Date  . AAA (abdominal aortic aneurysm) ([place])    a. 08/2015 Abd U/S: 2.7 x 2.9 x 3.2 cm infrarenal AAA.  . Bradycardia    a. Requiring discontinuation of use of BB/CCB  . Cardiac arrest - ventricular fibrillation    a. 03/2012 in setting of hypokalemia (prolonged hosp with VDRF, tracheobronchitis, ARF, shock liver, PAF, AMS felt secondary to post-anoxic encephalopathy/shock).  . Carotid artery stenosis    a. 01/2011 - 40-59% bilateral stenosis;  b. 06/2015 Carotid U/S: 40-59% bilat ICA stenosis->f/u 6 mos.  . CKD (chronic kidney disease), stage III   . Coronary artery disease    a. s/p aborted ant STEMI tx with Cypher DES to LAD 10/04 (residual at cath: D1 50%, CFX 40% and multiple dist 70%, EF 55%);   b. myoview 3/10: Ef 47%, infero-apical isch, LOW RISK - med Tx recommended;  c. 12/2012 VF Arrest/Cath: LAD 40isr, 80 apical, LCX 100 (failed PTCA), RCA nondom.  . Diabetes mellitus   . Diverticulosis    2001  .  Diverticulosis 2001  . Elevated LFTs    Shock liver 03/2012  . H/O: CVA (cardiovascular accident)   . Hematemesis    a. 12/2010 felt 2/2 Mallory Weiss tear - pt could not afford colonscopy/EGD at that time so Coumadin was deferred. Coumadin initiated 03/2012 without any evidence for bleeding.  Marland Kitchen History of Ischemic Cardiomyopathy    a. EF 50-55% by echo 03/09/12 (was 30% by echo 02/24/12); b. 03/2013 Echo: EF 55-60%, mild MR, sev dil LA, mod dil RA, PASP 48mmHg.  Marland Kitchen Hypertensive heart disease    a. echo 4/12: EF 50%, asymmetric septal hypertrophy, no SAM or LVOT gradient, LAE, PASP 35  . Inguinal hernia   . Inguinal hernia without mention of obstruction or gangrene, unilateral or unspecified, (not specified as recurrent)   . 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 Mitral regurgitation    a. Mild by echo 03/2012 and 03/2013.  . Mixed Hyperlipidemia   . Peripheral arterial disease ([place])   . Peripheral vascular disease ([place])    a. h/o LE angioplasty;  b. 08/2015 Duplex: >50% RCIA, 100% REIA, >50% LEIA, elev vel in SMA, patent IVC.  Marland Kitchen Persistent atrial fibrillation ([place])    a. Dx 12/2010-->coumadin (CHA2DS2VASc = 7).  . QT prolongation     Patient Active Problem List  Diagnosis Date Noted  . CAD S/P PCI 10/13/2015  . Cardiomyopathy, new. Etiology apeears to be NICM 10/13/2015  . AAA (abdominal aortic aneurysm) ([place])   . Peripheral vascular disease ([place])   . Carotid artery stenosis   . Hypertensive heart disease   . Bruit 10/24/2014  . Acute on chronic renal failure ([place]) 01/11/2013  . Acute MI, anterolateral wall, initial episode of care ([place]) 12/31/2012  . Cardiac arrest due to underlying cardiac condition ([place]) 12/31/2012  . VF (ventricular fibrillation) ([place]) 12/31/2012  . Abnormal LFTs 12/31/2012  . Acute on chronic renal insufficiency 12/31/2012  . Chronic anticoagulation 12/31/2012  . Hypokalemia 12/31/2012  . Fatigue 04/23/2012  . Long term  (current) use of anticoagulants 03/19/2012  . CKD (chronic kidney disease), stage III 03/16/2012  . Acute confusion/delerium 03/12/2012  . Delirium 03/12/2012  . Acute systolic CHF (congestive heart failure) ([place]) 03/11/2012  . Septic shock(785.52) 02/27/2012    Class: Acute  . Cardiogenic shock ([place]) 02/25/2012  . Acute renal failure ([place]) 02/25/2012  . Anoxic encephalopathy ([place]) 02/25/2012  . Ischemic cardiomyopathy 02/25/2012  . Cardiac arrest ([place]) 02/23/2012  . Acute respiratory failure with hypoxia ([place]) 02/23/2012  . Ventricular fibrillation ([place]) 02/23/2012  . DM (diabetes mellitus) ([place]) 02/23/2012  . Screening for colon cancer 02/10/2011  . Hematemesis 01/13/2011  . Dyspnea 01/13/2011  . RENAL INSUFFICIENCY 08/26/2010  . HYPERPOTASSEMIA 09/15/2009  . TOBACCO ABUSE 08/11/2009  . GLUCOSE INTOLERANCE 10/02/2008  . Hyperlipidemia 10/02/2008  . Essential hypertension 10/02/2008  . Cerebrovascular disease 10/02/2008  . PERIPHERAL VASCULAR DISEASE 10/02/2008  . INGUINAL HERNIA 10/02/2008    Past Surgical History:  Procedure Laterality Date  . HERNIA REPAIR    . LEFT HEART CATH N/A 12/30/2012   Procedure: LEFT HEART CATH;  Surgeon: [name], MD;  Location: [facility] CATH LAB;  Service: Cardiovascular;  Laterality: N/A;  . ORIF TIBIA & FIBULA FRACTURES  01/11/07   OPEN TX OF UNICONDYLAR PLATEAU FRACTURE, IRRIGATION/DEBRIDEMENT OF OPEN FRACTURE INCLUDING BONE, REMOVAL OF EXTERNAL FIXATOR UNDER ANESTHESIA PER DR. MICHAEL HANDY  . PERIPHERAL VASCULAR CATHETERIZATION N/A 12/21/2015   Procedure: Lower Extremity Angiography;  Surgeon: [name], MD;  Location: [facility] CV LAB;  Service: Cardiovascular;  Laterality: N/A;  . POPLITEAL ARTERY ANGIOPLASTY  01/07/07   s/p LEFT POPLITEAL ARTERY EXPLORATION AND VEIN PATCH ANGIOPLASTY, PER DR. EARLY, SECONDARY TO ISCHEMIC LEFT FOOT RELATED TO LEFT POPLITEAL ARTERY INJURY  . SKIN GRAFT          Home Medications    Prior to  Admission medications   Medication Sig Start Date End Date Taking? Authorizing Provider  apixaban (ELIQUIS) 5 MG TABS tablet Take 1 tablet (5 mg total) by mouth 2 (two) times daily. 06/08/18   Lelon Perla, MD  atorvastatin (LIPITOR) 80 MG tablet TAKE 1 TABLET BY MOUTH EVERY DAY 02/22/19   Lelon Perla, MD  furosemide (LASIX) 20 MG tablet TAKE 2 TABLETS BY MOUTH EVERY DAY, NEED APPIONTMENT FOR ADDITIONAL REFILLS 05/07/19   Lelon Perla, MD  hydrALAZINE (APRESOLINE) 10 MG tablet Take 1 tablet (10 mg total) by mouth 3 (three) times daily. *NEEDS OFFICE VISIT* 2nd attempt 06/03/19   Lelon Perla, MD  KLOR-CON M20 20 MEQ tablet TAKE 1 TABLET BY MOUTH TWICE A DAY 07/16/18   Lelon Perla, MD  losartan (COZAAR) 50 MG tablet Take 1 tablet (50 mg total) by mouth daily. 06/13/18 09/11/18  Lelon Perla, MD  metoprolol succinate (TOPROL-XL) 100 MG  24 hr tablet TAKE 1 TABLET (100 MG TOTAL) BY MOUTH DAILY. *NEEDS OFFICE VISIT FOR FURTHER REFILLS* 06/03/19   Lelon Perla, MD  nitroGLYCERIN (NITROSTAT) 0.4 MG SL tablet Place 1 tablet (0.4 mg total) under the tongue every 5 (five) minutes as needed for chest pain (Up to 3 doses). 01/11/13   Theora Gianotti, NP  ondansetron (ZOFRAN) 4 MG tablet Take 1 tablet (4 mg total) by mouth every 6 (six) hours as needed for nausea. 8/85/02   Delora Fuel, MD  famotidine (PEPCID) 20 MG tablet Take 1 tablet (20 mg total) by mouth 2 (two) times daily. 01/13/11 11/25/11  Liliane Shi, PA-C    Family History Family History  Problem Relation Age of Onset  . Heart disease Father        ALSO UNCLE DIED HAD CAD  . Heart failure Father   . Brain cancer Mother   . Leukemia Mother   . Sickle cell anemia Mother   . Sickle cell anemia Other     Social History Social History   Tobacco Use  . Smoking status: Current Some Day Smoker    Packs/day: 0.50    Years: 30.00    Pack years: 15.00    Types: Cigarettes    Last attempt to quit: 03/12/2012     Years since quitting: 7.2  . Smokeless tobacco: Never Used  Substance Use Topics  . Alcohol use: No    Alcohol/week: 0.0 standard drinks  . Drug use: No    Frequency: 2.0 times per week     Allergies   Patient has no known allergies.   Review of Systems Review of Systems  Respiratory: Positive for shortness of breath.   All other systems reviewed and are negative.    Physical Exam Updated Vital Signs BP 108/73   Pulse 77   Temp (!) 97.5 F (36.4 C) (Oral)   Resp (!) 28   Ht 6\' 3"  (1.905 m)   Wt 73 kg   SpO2 98%   BMI 20.12 kg/m   Physical Exam Vitals signs and nursing note reviewed.  Constitutional:      Comments: Chronically ill   HENT:     Head: Normocephalic.  Eyes:     Pupils: Pupils are equal, round, and reactive to light.  Neck:     Musculoskeletal: Normal range of motion.  Cardiovascular:     Rate and Rhythm: Normal rate and regular rhythm.  Pulmonary:     Comments: Slightly tachypneic, mild diffuse wheezing. No crackles  Abdominal:     General: Bowel sounds are normal.     Palpations: Abdomen is soft.  Musculoskeletal: Normal range of motion.     Comments: R big toe slightly swollen but no signs of septic joint. Trace edema bilaterally, no obvious calf tenderness   Skin:    General: Skin is warm.     Capillary Refill: Capillary refill takes less than 2 seconds.  Neurological:     General: No focal deficit present.     Mental Status: He is alert and oriented to person, place, and time.  Psychiatric:        Mood and Affect: Mood normal.        Behavior: Behavior normal.      ED Treatments / Results  Labs (all labs ordered are listed, but only abnormal results are displayed) Labs Reviewed  CBC WITH DIFFERENTIAL/PLATELET - Abnormal; Notable for the following components:      Result Value  Hemoglobin 12.5 (*)    HCT 36.3 (*)    MCV 78.1 (*)    All other components within normal limits  COMPREHENSIVE METABOLIC PANEL - Abnormal; Notable  for the following components:   Glucose, Bld 103 (*)    BUN 32 (*)    Creatinine, Ser 1.56 (*)    Calcium 8.7 (*)    Albumin 3.4 (*)    AST 49 (*)    Alkaline Phosphatase 146 (*)    Total Bilirubin 1.5 (*)    GFR calc non Af Amer 43 (*)    GFR calc Af Amer 49 (*)    All other components within normal limits  BRAIN NATRIURETIC PEPTIDE - Abnormal; Notable for the following components:   B Natriuretic Peptide 1,512.4 (*)    All other components within normal limits  PROTIME-INR - Abnormal; Notable for the following components:   Prothrombin Time 17.7 (*)    INR 1.5 (*)    All other components within normal limits  TROPONIN I (HIGH SENSITIVITY) - Abnormal; Notable for the following components:   Troponin I (High Sensitivity) 18 (*)    All other components within normal limits  SARS CORONAVIRUS 2 (TAT 6-24 HRS)  TROPONIN I (HIGH SENSITIVITY)    EKG EKG Interpretation  Date/Time:  Saturday June 22 2019 18:20:00 EDT Ventricular Rate:  77 PR Interval:    QRS Duration: 114 QT Interval:  394 QTC Calculation: 446 R Axis:   96 Text Interpretation:  Atrial fibrillation Anterior infarct, old Repol abnrm suggests ischemia, anterolateral No significant change since last tracing Confirmed by Wandra Arthurs [phone]) on 06/22/2019 6:37:34 PM   Radiology Dg Chest 2 View  Result Date: 06/22/2019 CLINICAL DATA:  Multiple cardiac issues. Two cardiac arrest. Shortness of breath. EXAM: CHEST - 2 VIEW COMPARISON:  Jan 10, 2013 FINDINGS: Stable cardiomegaly. Right greater than left central pulmonary opacities. No pneumothorax. No other abnormalities. IMPRESSION: 1. The central opacities, right greater than left, may represent asymmetric edema versus an infectious process. Recommend clinical correlation and follow-up imaging to ensure resolution. Electronically Signed   By: Dorise Bullion III M.D   On: 06/22/2019 19:16    Procedures Procedures (including critical care time)  CRITICAL CARE  Performed by: Wandra Arthurs   Total critical care time: 30 minutes  Critical care time was exclusive of separately billable procedures and treating other patients.  Critical care was necessary to treat or prevent imminent or life-threatening deterioration.  Critical care was time spent personally by me on the following activities: development of treatment plan with patient and/or surrogate as well as nursing, discussions with consultants, evaluation of patient's response to treatment, examination of patient, obtaining history from patient or surrogate, ordering and performing treatments and interventions, ordering and review of laboratory studies, ordering and review of radiographic studies, pulse oximetry and re-evaluation of patient's condition.   Medications Ordered in ED Medications  albuterol (VENTOLIN HFA) 108 (90 Base) MCG/ACT inhaler 2 puff (2 puffs Inhalation Given 06/22/19 1851)  methylPREDNISolone sodium succinate (SOLU-MEDROL) 125 mg/2 mL injection 125 mg (125 mg Intravenous Given 06/22/19 1849)  furosemide (LASIX) injection 40 mg (40 mg Intravenous Given 06/22/19 1951)     Initial Impression / Assessment and Plan / ED Course  I have reviewed the triage vital signs and the nursing notes.  Pertinent labs & imaging results that were available during my care of the patient were reviewed by me and considered in my medical decision making (see chart for details).  PIERCE BAROCIO is a 79 y.o. male here with SOB. SOB mainly at night. He has EF of 35% several years ago. Suspect CHF exacerbation. He is also wheezing so consider bronchitis. He has no COVID exposure. Will get labs, BNP, CXR. Will give albuterol and steroids. Will test for COVID.   8:11 PM Patient's BNP 1500. CXR showed asymmetric edema. Given lasix 40 mg IV. His O2 dropped to 85% when sleeping. Will admit for hypoxia from CHF exacerbation. Hospitalist to admit    Final Clinical Impressions(s) / ED Diagnoses    Final diagnoses:  None    ED Discharge Orders    None       Drenda Freeze, MD 06/22/19 2013

## 2019-06-23 ENCOUNTER — Inpatient Hospital Stay (HOSPITAL_COMMUNITY): Payer: Medicare HMO

## 2019-06-23 DIAGNOSIS — I340 Nonrheumatic mitral (valve) insufficiency: Secondary | ICD-10-CM

## 2019-06-23 DIAGNOSIS — I10 Essential (primary) hypertension: Secondary | ICD-10-CM

## 2019-06-23 DIAGNOSIS — I714 Abdominal aortic aneurysm, without rupture: Secondary | ICD-10-CM

## 2019-06-23 DIAGNOSIS — I4819 Other persistent atrial fibrillation: Secondary | ICD-10-CM | POA: Diagnosis present

## 2019-06-23 DIAGNOSIS — N183 Chronic kidney disease, stage 3 unspecified: Secondary | ICD-10-CM

## 2019-06-23 DIAGNOSIS — F17200 Nicotine dependence, unspecified, uncomplicated: Secondary | ICD-10-CM

## 2019-06-23 DIAGNOSIS — J9602 Acute respiratory failure with hypercapnia: Secondary | ICD-10-CM

## 2019-06-23 DIAGNOSIS — Z7901 Long term (current) use of anticoagulants: Secondary | ICD-10-CM

## 2019-06-23 DIAGNOSIS — I361 Nonrheumatic tricuspid (valve) insufficiency: Secondary | ICD-10-CM

## 2019-06-23 DIAGNOSIS — J9601 Acute respiratory failure with hypoxia: Secondary | ICD-10-CM

## 2019-06-23 LAB — COMPREHENSIVE METABOLIC PANEL
ALT: 38 U/L (ref 0–44)
AST: 40 U/L (ref 15–41)
Albumin: 3.1 g/dL — ABNORMAL LOW (ref 3.5–5.0)
Alkaline Phosphatase: 146 U/L — ABNORMAL HIGH (ref 38–126)
Anion gap: 12 (ref 5–15)
BUN: 36 mg/dL — ABNORMAL HIGH (ref 8–23)
CO2: 22 mmol/L (ref 22–32)
Calcium: 8.8 mg/dL — ABNORMAL LOW (ref 8.9–10.3)
Chloride: 102 mmol/L (ref 98–111)
Creatinine, Ser: 1.79 mg/dL — ABNORMAL HIGH (ref 0.61–1.24)
GFR calc Af Amer: 42 mL/min — ABNORMAL LOW (ref >60)
GFR calc non Af Amer: 36 mL/min — ABNORMAL LOW (ref >60)
Glucose, Bld: 157 mg/dL — ABNORMAL HIGH (ref 70–99)
Potassium: 4.3 mmol/L (ref 3.5–5.1)
Sodium: 136 mmol/L (ref 135–145)
Total Bilirubin: 1.5 mg/dL — ABNORMAL HIGH (ref 0.3–1.2)
Total Protein: 6.6 g/dL (ref 6.5–8.1)

## 2019-06-23 LAB — ECHOCARDIOGRAM COMPLETE
Height: 75 in
Weight: 2705.6 oz

## 2019-06-23 LAB — SARS CORONAVIRUS 2 (TAT 6-24 HRS): SARS Coronavirus 2: NEGATIVE

## 2019-06-23 LAB — MRSA PCR SCREENING: MRSA by PCR: POSITIVE — AB

## 2019-06-23 LAB — MAGNESIUM: Magnesium: 2 mg/dL (ref 1.7–2.4)

## 2019-06-23 MED ORDER — ACETAMINOPHEN 325 MG PO TABS: 650.0000 mg | ORAL_TABLET | ORAL | Status: DC | PRN

## 2019-06-23 MED ORDER — PREDNISONE 20 MG PO TABS
40.0000 mg | ORAL_TABLET | Freq: Every day | ORAL | Status: DC
  Administered 2019-06-23 – 2019-06-24 (×2): 40 mg via ORAL
  Filled 2019-06-23 (×2): qty 2

## 2019-06-23 MED ORDER — METOPROLOL SUCCINATE ER 100 MG PO TB24
100.0000 mg | ORAL_TABLET | Freq: Every day | ORAL | Status: DC
  Administered 2019-06-23 – 2019-06-24 (×2): 100 mg via ORAL
  Filled 2019-06-23 (×2): qty 1

## 2019-06-23 MED ORDER — APIXABAN 5 MG PO TABS
5.0000 mg | ORAL_TABLET | Freq: Two times a day (BID) | ORAL | Status: DC
  Administered 2019-06-23 – 2019-06-24 (×3): 5 mg via ORAL
  Filled 2019-06-23 (×3): qty 1

## 2019-06-23 MED ORDER — ALBUTEROL SULFATE (2.5 MG/3ML) 0.083% IN NEBU
2.5000 mg | INHALATION_SOLUTION | RESPIRATORY_TRACT | Status: DC | PRN
  Administered 2019-06-24: 2.5 mg via RESPIRATORY_TRACT
  Filled 2019-06-23: qty 3

## 2019-06-23 MED ORDER — ONDANSETRON HCL 4 MG/2ML IJ SOLN: 4.0000 mg | Freq: Four times a day (QID) | INTRAMUSCULAR | Status: DC | PRN

## 2019-06-23 MED ORDER — NICOTINE 21 MG/24HR TD PT24
21.0000 mg | MEDICATED_PATCH | Freq: Every day | TRANSDERMAL | Status: DC
  Administered 2019-06-23 – 2019-06-24 (×3): 21 mg via TRANSDERMAL
  Filled 2019-06-23 (×3): qty 1

## 2019-06-23 MED ORDER — PREDNISONE 20 MG PO TABS: 40.0000 mg | ORAL_TABLET | Freq: Every day | ORAL | Status: DC

## 2019-06-23 MED ORDER — ATORVASTATIN CALCIUM 80 MG PO TABS
80.0000 mg | ORAL_TABLET | Freq: Every day | ORAL | Status: DC
  Administered 2019-06-23 – 2019-06-24 (×2): 80 mg via ORAL
  Filled 2019-06-23 (×2): qty 1

## 2019-06-23 MED ORDER — SODIUM CHLORIDE 0.9% FLUSH
3.0000 mL | Freq: Two times a day (BID) | INTRAVENOUS | Status: DC
  Administered 2019-06-23 (×2): 3 mL via INTRAVENOUS

## 2019-06-23 MED ORDER — POTASSIUM CHLORIDE CRYS ER 20 MEQ PO TBCR
20.0000 meq | EXTENDED_RELEASE_TABLET | Freq: Two times a day (BID) | ORAL | Status: DC
  Administered 2019-06-23 – 2019-06-24 (×3): 20 meq via ORAL
  Filled 2019-06-23 (×4): qty 1

## 2019-06-23 MED ORDER — FUROSEMIDE 10 MG/ML IJ SOLN
40.0000 mg | Freq: Two times a day (BID) | INTRAMUSCULAR | Status: DC
  Administered 2019-06-23 – 2019-06-24 (×3): 40 mg via INTRAVENOUS
  Filled 2019-06-23 (×3): qty 4

## 2019-06-23 MED ORDER — ALBUTEROL SULFATE (2.5 MG/3ML) 0.083% IN NEBU
2.5000 mg | INHALATION_SOLUTION | Freq: Once | RESPIRATORY_TRACT | Status: AC
  Administered 2019-06-23: 17:00:00 2.5 mg via RESPIRATORY_TRACT
  Filled 2019-06-23: qty 3

## 2019-06-23 MED ORDER — SODIUM CHLORIDE 0.9 % IV SOLN: 250.0000 mL | INTRAVENOUS | Status: DC | PRN

## 2019-06-23 MED ORDER — GUAIFENESIN ER 600 MG PO TB12
600.0000 mg | ORAL_TABLET | Freq: Two times a day (BID) | ORAL | Status: DC
  Administered 2019-06-23 – 2019-06-24 (×2): 600 mg via ORAL
  Filled 2019-06-23 (×2): qty 1

## 2019-06-23 MED ORDER — MUPIROCIN 2 % EX OINT
1.0000 "application " | TOPICAL_OINTMENT | Freq: Two times a day (BID) | CUTANEOUS | Status: DC
  Administered 2019-06-23 – 2019-06-24 (×3): 1 via NASAL
  Filled 2019-06-23 (×2): qty 22

## 2019-06-23 MED ORDER — ALBUTEROL SULFATE HFA 108 (90 BASE) MCG/ACT IN AERS
2.0000 | INHALATION_SPRAY | RESPIRATORY_TRACT | Status: DC | PRN
  Administered 2019-06-23: 2 via RESPIRATORY_TRACT
  Filled 2019-06-23: qty 6.7

## 2019-06-23 MED ORDER — SODIUM CHLORIDE 0.9% FLUSH
3.0000 mL | INTRAVENOUS | Status: DC | PRN
  Administered 2019-06-24: 3 mL via INTRAVENOUS
  Filled 2019-06-23: qty 3

## 2019-06-23 MED ORDER — CHLORHEXIDINE GLUCONATE CLOTH 2 % EX PADS: 6.0000 | MEDICATED_PAD | Freq: Every day | CUTANEOUS | Status: DC

## 2019-06-23 NOTE — H&P (Signed)
History and Physical    Logan White:[id number] DOB: 1940-09-11 DOA: 06/22/2019  Referring MD/NP/PA: Ivor Costa, MD PCP: Patient, No Pcp Per  Patient coming from: [facility] transfer  Chief Complaint: Could not breathe  I have personally briefly reviewed patient's old medical records in [place]   HPI: Logan White is a 78 y.o. male with medical history significant of AF on Eliquis, prediabetes/DM type II, HTN, HLD, AAA, CVA, PAD, and cardiac arrest secondary to V. fib.  Patient presents with complaints of progressively worsening shortness of breath over the last 2 weeks.  Notes that 2 weeks ago he was driven up to [place] by his son when he was supposed to stay these 2 weeks.  However, patient notes that he developed a gout flare right great toe and was having leg swelling.  His son's home had multiple flights of stairs and he was having difficulty and was therefore brought back home 5 days ago. Reports that they may have stopped 1 or 2 times each way.  He does not routinely check his weight, but felt like it was stable.  Patient had quit smoking cold [place], but recently started back up.  I not been able to sleep at night due to shortness of breath symptoms.  Associated symptoms include orthopnea, wheezing, and intermittently productive cough.  Denies having any significant fever, known COVID-19 exposure, chest pain, abdominal pain, nausea, vomiting, diarrhea, or recent trauma.  Patient has been taking his Eliquis as advised and his right great toe is no longer hurting him.   ED Course: Upon admission patient was noted to be tachypneic with O2 saturations as low as 85% and improved with 2 L of nasal cannula oxygen.  Labs significant for hemoglobin 12.5, BUN 32, BUN 32, creatinine 1.56, BNP 1512.4, high-sensitivity troponin 18->17.  Patient had been given 40 mg of Lasix IV, albuterol, and nicotine patch.  Review of Systems  Constitutional: Negative for fever and weight loss.   HENT: Negative for ear pain and sinus pain.   Eyes: Negative for double vision and photophobia.  Respiratory: Positive for cough, sputum production and shortness of breath.   Cardiovascular: Positive for orthopnea and leg swelling.  Gastrointestinal: Negative for abdominal pain, diarrhea, nausea and vomiting.  Genitourinary: Negative for dysuria and frequency.  Musculoskeletal: Negative for falls, joint pain and myalgias.  Skin: Negative for itching and rash.  Neurological: Negative for focal weakness and loss of consciousness.  Psychiatric/Behavioral: Negative for substance abuse. The patient has insomnia.     Past Medical History:  Diagnosis Date  . AAA (abdominal aortic aneurysm) ([place])    a. 08/2015 Abd U/S: 2.7 x 2.9 x 3.2 cm infrarenal AAA.  . Bradycardia    a. Requiring discontinuation of use of BB/CCB  . Cardiac arrest - ventricular fibrillation    a. 03/2012 in setting of hypokalemia (prolonged hosp with VDRF, tracheobronchitis, ARF, shock liver, PAF, AMS felt secondary to post-anoxic encephalopathy/shock).  . Carotid artery stenosis    a. 01/2011 - 40-59% bilateral stenosis;  b. 06/2015 Carotid U/S: 40-59% bilat ICA stenosis->f/u 6 mos.  . CKD (chronic kidney disease), stage III   . Coronary artery disease    a. s/p aborted ant STEMI tx with Cypher DES to LAD 10/04 (residual at cath: D1 50%, CFX 40% and multiple dist 70%, EF 55%);   b. myoview 3/10: Ef 47%, infero-apical isch, LOW RISK - med Tx recommended;  c. 12/2012 VF Arrest/Cath: LAD 40isr, 80 apical, LCX 100 (failed  PTCA), RCA nondom.  . Diabetes mellitus   . Diverticulosis    2001  . Diverticulosis 2001  . Elevated LFTs    Shock liver 03/2012  . H/O: CVA (cardiovascular accident)   . Hematemesis    a. 12/2010 felt 2/2 Mallory Weiss tear - pt could not afford colonscopy/EGD at that time so Coumadin was deferred. Coumadin initiated 03/2012 without any evidence for bleeding.  Marland Kitchen History of Ischemic Cardiomyopathy    a. EF  50-55% by echo 03/09/12 (was 30% by echo 02/24/12); b. 03/2013 Echo: EF 55-60%, mild MR, sev dil LA, mod dil RA, PASP 42mmHg.  Marland Kitchen Hypertensive heart disease    a. echo 4/12: EF 50%, asymmetric septal hypertrophy, no SAM or LVOT gradient, LAE, PASP 35  . Inguinal hernia   . Inguinal hernia without mention of obstruction or gangrene, unilateral or unspecified, (not specified as recurrent)   . 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 Mitral regurgitation    a. Mild by echo 03/2012 and 03/2013.  . Mixed Hyperlipidemia   . Peripheral arterial disease ([place])   . Peripheral vascular disease ([place])    a. h/o LE angioplasty;  b. 08/2015 Duplex: >50% RCIA, 100% REIA, >50% LEIA, elev vel in SMA, patent IVC.  Marland Kitchen Persistent atrial fibrillation ([place])    a. Dx 12/2010-->coumadin (CHA2DS2VASc = 7).  . QT prolongation     Past Surgical History:  Procedure Laterality Date  . HERNIA REPAIR    . LEFT HEART CATH N/A 12/30/2012   Procedure: LEFT HEART CATH;  Surgeon: [name], MD;  Location: [facility] CATH LAB;  Service: Cardiovascular;  Laterality: N/A;  . ORIF TIBIA & FIBULA FRACTURES  01/11/07   OPEN TX OF UNICONDYLAR PLATEAU FRACTURE, IRRIGATION/DEBRIDEMENT OF OPEN FRACTURE INCLUDING BONE, REMOVAL OF EXTERNAL FIXATOR UNDER ANESTHESIA PER DR. MICHAEL HANDY  . PERIPHERAL VASCULAR CATHETERIZATION N/A 12/21/2015   Procedure: Lower Extremity Angiography;  Surgeon: [name], MD;  Location: [facility] CV LAB;  Service: Cardiovascular;  Laterality: N/A;  . POPLITEAL ARTERY ANGIOPLASTY  01/07/07   s/p LEFT POPLITEAL ARTERY EXPLORATION AND VEIN PATCH ANGIOPLASTY, PER DR. EARLY, SECONDARY TO ISCHEMIC LEFT FOOT RELATED TO LEFT POPLITEAL ARTERY INJURY  . SKIN GRAFT       reports that he has been smoking cigarettes. He has a 15.00 pack-year smoking history. He has never used smokeless tobacco. He reports that he does not drink alcohol or use drugs.  No Known Allergies  Family History   Problem Relation Age of Onset  . Heart disease Father        ALSO UNCLE DIED HAD CAD  . Heart failure Father   . Brain cancer Mother   . Leukemia Mother   . Sickle cell anemia Mother   . Sickle cell anemia Other     Prior to Admission medications   Medication Sig Start Date End Date Taking? Authorizing Provider  apixaban (ELIQUIS) 5 MG TABS tablet Take 1 tablet (5 mg total) by mouth 2 (two) times daily. 06/08/18   Lelon Perla, MD  atorvastatin (LIPITOR) 80 MG tablet TAKE 1 TABLET BY MOUTH EVERY DAY 02/22/19   Lelon Perla, MD  furosemide (LASIX) 20 MG tablet TAKE 2 TABLETS BY MOUTH EVERY DAY, NEED APPIONTMENT FOR ADDITIONAL REFILLS 05/07/19   Lelon Perla, MD  hydrALAZINE (APRESOLINE) 10 MG tablet Take 1 tablet (10 mg total) by mouth 3 (three) times daily. *NEEDS OFFICE VISIT* 2nd attempt 06/03/19   Lelon Perla,  MD  KLOR-CON M20 20 MEQ tablet TAKE 1 TABLET BY MOUTH TWICE A DAY 07/16/18   Lelon Perla, MD  losartan (COZAAR) 50 MG tablet Take 1 tablet (50 mg total) by mouth daily. 06/13/18 09/11/18  Lelon Perla, MD  metoprolol succinate (TOPROL-XL) 100 MG 24 hr tablet TAKE 1 TABLET (100 MG TOTAL) BY MOUTH DAILY. *NEEDS OFFICE VISIT FOR FURTHER REFILLS* 06/03/19   Lelon Perla, MD  nitroGLYCERIN (NITROSTAT) 0.4 MG SL tablet Place 1 tablet (0.4 mg total) under the tongue every 5 (five) minutes as needed for chest pain (Up to 3 doses). 01/11/13   Theora Gianotti, NP  ondansetron (ZOFRAN) 4 MG tablet Take 1 tablet (4 mg total) by mouth every 6 (six) hours as needed for nausea. 0/27/74   Delora Fuel, MD  famotidine (PEPCID) 20 MG tablet Take 1 tablet (20 mg total) by mouth 2 (two) times daily. 01/13/11 11/25/11  Liliane Shi, PA-C    Physical Exam:  Constitutional: Elderly male who appears to be currently in NAD, calm, comfortable Vitals:   06/23/19 0511 06/23/19 0515 06/23/19 0530 06/23/19 0635  BP:    120/81  Pulse:  75 81 81  Resp:  (!) 24 (!) 27 18   Temp: 98.1 F (36.7 C)   98.3 F (36.8 C)  TempSrc: Oral   Oral  SpO2:  94% 96% 100%  Weight:    76.7 kg  Height:    6\' 3"  (1.905 m)   Eyes: PERRL, lids and conjunctivae normal ENMT: Mucous membranes are moist. Posterior pharynx clear of any exudate or lesions.  Neck: normal, supple, no masses, no thyromegaly.  Positive JVD. Respiratory: Normal respiratory effort at this time with possible crackles in the mid to lower lung fields.  No accessory muscle use noted.  Patient able to talk in complete sentences. Cardiovascular: Irregular irregular, no murmurs / rubs / gallops.  +1 pitting bilateral lower extremity edema. 2+ pedal pulses. No carotid bruits.  Abdomen: no tenderness, no masses palpated. No hepatosplenomegaly. Bowel sounds positive.  Musculoskeletal: no clubbing / cyanosis. No joint deformity upper and lower extremities. Good ROM, no contractures. Normal muscle tone.  Skin: no rashes, lesions, ulcers. No induration Neurologic: CN 2-12 grossly intact. Sensation intact, DTR normal. Strength 5/5 in all 4.  Psychiatric: Normal judgment and insight. Alert and oriented x 3. Normal mood.     Labs on Admission: I have personally reviewed following labs and imaging studies  CBC: Recent Labs  Lab 06/22/19 1844  WBC 6.1  NEUTROABS 2.6  HGB 12.5*  HCT 36.3*  MCV 78.1*  PLT 128   Basic Metabolic Panel: Recent Labs  Lab 06/22/19 1844  NA 136  K 4.4  CL 103  CO2 23  GLUCOSE 103*  BUN 32*  CREATININE 1.56*  CALCIUM 8.7*   GFR: Estimated Creatinine Clearance: 43.7 mL/min (A) (by C-G formula based on SCr of 1.56 mg/dL (H)). Liver Function Tests: Recent Labs  Lab 06/22/19 1844  AST 49*  ALT 39  ALKPHOS 146*  BILITOT 1.5*  PROT 6.9  ALBUMIN 3.4*   No results for input(s): LIPASE, AMYLASE in the last 168 hours. No results for input(s): AMMONIA in the last 168 hours. Coagulation Profile: Recent Labs  Lab 06/22/19 1848  INR 1.5*   Cardiac Enzymes: No results for  input(s): CKTOTAL, CKMB, CKMBINDEX, TROPONINI in the last 168 hours. BNP (last 3 results) No results for input(s): PROBNP in the last 8760 hours. HbA1C: No results for input(s):  HGBA1C in the last 72 hours. CBG: No results for input(s): GLUCAP in the last 168 hours. Lipid Profile: No results for input(s): CHOL, HDL, LDLCALC, TRIG, CHOLHDL, LDLDIRECT in the last 72 hours. Thyroid Function Tests: No results for input(s): TSH, T4TOTAL, FREET4, T3FREE, THYROIDAB in the last 72 hours. Anemia Panel: No results for input(s): VITAMINB12, FOLATE, FERRITIN, TIBC, IRON, RETICCTPCT in the last 72 hours. Urine analysis:    Component Value Date/Time   COLORURINE YELLOW 11/27/2017 0136   APPEARANCEUR CLEAR 11/27/2017 0136   LABSPEC 1.011 11/27/2017 0136   PHURINE 5.0 11/27/2017 0136   GLUCOSEU NEGATIVE 11/27/2017 0136   HGBUR SMALL (A) 11/27/2017 0136   BILIRUBINUR NEGATIVE 11/27/2017 0136   KETONESUR NEGATIVE 11/27/2017 0136   PROTEINUR NEGATIVE 11/27/2017 0136   UROBILINOGEN 0.2 03/23/2013 1740   NITRITE NEGATIVE 11/27/2017 0136   LEUKOCYTESUR NEGATIVE 11/27/2017 0136   Sepsis Labs: Recent Results (from the past 240 hour(s))  SARS CORONAVIRUS 2 (TAT 6-24 HRS) Nasopharyngeal Nasopharyngeal Swab     Status: None   Collection Time: 06/22/19  7:55 PM   Specimen: Nasopharyngeal Swab  Result Value Ref Range Status   SARS Coronavirus 2 NEGATIVE NEGATIVE Final    Comment: (NOTE) SARS-CoV-2 target nucleic acids are NOT DETECTED. The SARS-CoV-2 RNA is generally detectable in upper and lower respiratory specimens during the acute phase of infection. Negative results do not preclude SARS-CoV-2 infection, do not rule out co-infections with other pathogens, and should not be used as the sole basis for treatment or other patient management decisions. Negative results must be combined with clinical observations, patient history, and epidemiological information. The expected result is Negative. Fact  Sheet for Patients: SugarRoll.be Fact Sheet for Healthcare Providers: https://www.woods-mathews.com/ This test is not yet approved or cleared by the [place] FDA and  has been authorized for detection and/or diagnosis of SARS-CoV-2 by FDA under an Emergency Use Authorization (EUA). This EUA will remain  in effect (meaning this test can be used) for the duration of the COVID-19 declaration under Section 56 4(b)(1) of the Act, 21 U.S.C. section 360bbb-3(b)(1), unless the authorization is terminated or revoked sooner. Performed at [facility], [place] [address], [place], [place] [address]      Radiological Exams on Admission: Dg Chest 2 View  Result Date: 06/22/2019 CLINICAL DATA:  Multiple cardiac issues. Two cardiac arrest. Shortness of breath. EXAM: CHEST - 2 VIEW COMPARISON:  Jan 10, 2013 FINDINGS: Stable cardiomegaly. Right greater than left central pulmonary opacities. No pneumothorax. No other abnormalities. IMPRESSION: 1. The central opacities, right greater than left, may represent asymmetric edema versus an infectious process. Recommend clinical correlation and follow-up imaging to ensure resolution. Electronically Signed   By: Dorise Bullion III M.D   On: 06/22/2019 19:16    EKG: Independently reviewed.  Atrial fibrillation at 77 bpm similar to previous tracings  Assessment/Plan Acute respiratory failure with hypoxia secondary to acute on chronic systolic congestive heart failure: On admission O2 saturations noted to be in 80s.  Patient was placed on 2 L of nasal cannula oxygen improvement in O2 saturations.  Exam reveals at least 1+ pitting bilateral lower extremity edema..  Chest x-ray showing cardiomegaly with central opacities concerning for edema versus infectious process.  BNP was elevated 1512.4.  Last EF noted to be 35 to 40% on 05/29/2018 by echocardiogram.  Patient given 40 mg of Lasix IV in the ED.  -Admit to a telemetry  bed  -Heart failure orders set  initiated  -Continuous pulse  oximetry with nasal cannula oxygen as needed to keep O2 saturations >92% -Strict I&Os and daily weights -Elevate lower extremities -Lasix 40 mg IV Bid -Reassess in a.m. and adjust diuresis as needed. -Check echocardiogram -Message sent for cardiology to evaluate  Acute bronchitis: Patient complaints of having wheezing and has significant history of tobacco abuse.  Does not appear to have a formal diagnosis of COPD. -Prednisone 40 mg daily -Albuterol inhaler as needed -Mucinex  Elevated troponin: Acute.  Initial troponin mildly elevated 18, but repeat check within normal limits at 17.  Suspect secondary to acute demand. -Continue to monitor  Essential hypertension: Blood pressures at the lower limit of normal on admission. -Hold losartan and hydralazine -Continue metoprolol -Restart other blood pressure medications when medically appropriate  Microcytic anemia: Hemoglobin 12.5 with low MCV. -Check repeat CBC, iron, and ferritin in a.m.  Chronic kidney disease stage III: Baseline creatinine previously noted to be around 1.6-1.7 -Continue to monitor with diuresis  AAA: Patient/AAA Doppler completed on 03/15/2019 noted aortic diameter remained unchanged at approximately 3 cm since last checked on 10/2017. -Continue outpatient surveillance with Dr. [name]   Permanent atrial fibrillation on chronic anticoagulation -Continue Eliquis  History of V. fib cardiac arrest, ischemic cardiomyopathy, CAD: Patient status post PCI of his LAD in October 2014.  V. fib arrest in June 2013 related to hypokalemia and again in April 2014. -Continue beta-blocker Tobacco abuse -Counseled on the need of cessation of tobacco use -Continue nicotine patch  DVT prophylaxis: Eliquis   Code Status: Full Family Communication: No family present at bedside Disposition Plan: Possible discharge home in 2 to 3 days Consults called: Message sent for  cardiology to eval Admission status: inaptient   Norval Morton MD [facility] Pager [phone]   If 7PM-7AM, please contact night-coverage [email or web address] Password [facility]  06/23/2019, 9:36 AM

## 2019-06-23 NOTE — ED Notes (Signed)
Pt's wife updated regarding room number per his request.

## 2019-06-23 NOTE — ED Notes (Signed)
Pt wheezing on assessment. Denies sob. 02 sats 95%. Asked Dr. Florina Ou to also assess pt. Awaiting inpt bed. Orders received for albuterol treatment. Pt is atrial fib on the monitor. Hx of same. Pt given some applesauce and small amount of juice per his request.

## 2019-06-23 NOTE — ED Notes (Signed)
Pt offered a smoking patch earlier and he declined. Pt now request the nicotine patch. Dr. Florina Ou aware and orders receive.d

## 2019-06-23 NOTE — ED Notes (Signed)
[facility] notified (Tara) - patient ready for transport 

## 2019-06-23 NOTE — ED Notes (Signed)
Report given to [facility] 

## 2019-06-23 NOTE — Progress Notes (Signed)
Some medication not given this morning such as metoprolol and eliquis  Contacted pharmacy, med rec. need to be done.

## 2019-06-23 NOTE — Progress Notes (Signed)
  Echocardiogram 2D Echocardiogram has been performed.  Taleya Whitcher L Androw 06/23/2019, 4:53 PM

## 2019-06-23 NOTE — ED Notes (Signed)
Awaiting [facility] to transport to [facility]

## 2019-06-23 NOTE — Progress Notes (Signed)
RT placed patient on 1 liter nasal cannula due to patient dipping to below 88% SPO2 at times. Patient's SPO2 increased to 97% initially and is now staying at or above 92%

## 2019-06-23 NOTE — ED Notes (Signed)
[facility] here to transport. VSS at transfer.

## 2019-06-23 NOTE — Progress Notes (Signed)
Patient resting in the bed without any complaints. Night shift RN paged MD Rondell about diet and other orders. Informed by night shift RN that orders are in process.

## 2019-06-24 ENCOUNTER — Encounter (HOSPITAL_COMMUNITY): Payer: Self-pay | Admitting: Physician Assistant

## 2019-06-24 DIAGNOSIS — I5023 Acute on chronic systolic (congestive) heart failure: Secondary | ICD-10-CM

## 2019-06-24 LAB — CBC WITH DIFFERENTIAL/PLATELET
Abs Immature Granulocytes: 0.04 10*3/uL (ref 0.00–0.07)
Basophils Absolute: 0 10*3/uL (ref 0.0–0.1)
Basophils Relative: 0 %
Eosinophils Absolute: 0 10*3/uL (ref 0.0–0.5)
Eosinophils Relative: 0 %
HCT: 37.1 % — ABNORMAL LOW (ref 39.0–52.0)
Hemoglobin: 12.9 g/dL — ABNORMAL LOW (ref 13.0–17.0)
Immature Granulocytes: 1 %
Lymphocytes Relative: 13 %
Lymphs Abs: 1.1 10*3/uL (ref 0.7–4.0)
MCH: 26.5 pg (ref 26.0–34.0)
MCHC: 34.8 g/dL (ref 30.0–36.0)
MCV: 76.2 fL — ABNORMAL LOW (ref 80.0–100.0)
Monocytes Absolute: 0.4 10*3/uL (ref 0.1–1.0)
Monocytes Relative: 5 %
Neutro Abs: 6.7 10*3/uL (ref 1.7–7.7)
Neutrophils Relative %: 81 %
Platelets: 298 10*3/uL (ref 150–400)
RBC: 4.87 MIL/uL (ref 4.22–5.81)
RDW: 14.9 % (ref 11.5–15.5)
WBC: 8.2 10*3/uL (ref 4.0–10.5)
nRBC: 0 % (ref 0.0–0.2)

## 2019-06-24 LAB — BASIC METABOLIC PANEL
Anion gap: 11 (ref 5–15)
BUN: 43 mg/dL — ABNORMAL HIGH (ref 8–23)
CO2: 22 mmol/L (ref 22–32)
Calcium: 9.1 mg/dL (ref 8.9–10.3)
Chloride: 103 mmol/L (ref 98–111)
Creatinine, Ser: 1.84 mg/dL — ABNORMAL HIGH (ref 0.61–1.24)
GFR calc Af Amer: 40 mL/min — ABNORMAL LOW (ref >60)
GFR calc non Af Amer: 35 mL/min — ABNORMAL LOW (ref >60)
Glucose, Bld: 152 mg/dL — ABNORMAL HIGH (ref 70–99)
Potassium: 4.8 mmol/L (ref 3.5–5.1)
Sodium: 136 mmol/L (ref 135–145)

## 2019-06-24 LAB — FERRITIN: Ferritin: 180 ng/mL (ref 24–336)

## 2019-06-24 LAB — IRON AND TIBC
Iron: 39 ug/dL — ABNORMAL LOW (ref 45–182)
Saturation Ratios: 14 % — ABNORMAL LOW (ref 17.9–39.5)
TIBC: 281 ug/dL (ref 250–450)
UIBC: 242 ug/dL

## 2019-06-24 MED ORDER — FUROSEMIDE 20 MG PO TABS: 60.0000 mg | ORAL_TABLET | ORAL | 0 refills | Status: DC

## 2019-06-24 MED ORDER — PNEUMOCOCCAL VAC POLYVALENT 25 MCG/0.5ML IJ INJ: 0.5000 mL | INJECTION | INTRAMUSCULAR | Status: DC

## 2019-06-24 MED ORDER — NICOTINE 21 MG/24HR TD PT24: 21.0000 mg | MEDICATED_PATCH | Freq: Every day | TRANSDERMAL | 0 refills | Status: DC

## 2019-06-24 MED ORDER — INFLUENZA VAC A&B SA ADJ QUAD 0.5 ML IM PRSY: 0.5000 mL | PREFILLED_SYRINGE | INTRAMUSCULAR | Status: DC

## 2019-06-24 MED ORDER — ISOSORBIDE MONONITRATE ER 30 MG PO TB24: 30.0000 mg | ORAL_TABLET | Freq: Every day | ORAL | 0 refills | Status: DC

## 2019-06-24 MED ORDER — SODIUM CHLORIDE 0.9 % IV SOLN
510.0000 mg | Freq: Once | INTRAVENOUS | Status: AC
  Administered 2019-06-24: 510 mg via INTRAVENOUS
  Filled 2019-06-24: qty 17

## 2019-06-24 MED ORDER — POTASSIUM CHLORIDE CRYS ER 20 MEQ PO TBCR: 20.0000 meq | EXTENDED_RELEASE_TABLET | ORAL | 3 refills | Status: DC

## 2019-06-24 MED ORDER — ISOSORBIDE MONONITRATE ER 30 MG PO TB24
30.0000 mg | ORAL_TABLET | Freq: Every day | ORAL | Status: DC
  Administered 2019-06-24: 30 mg via ORAL
  Filled 2019-06-24: qty 1

## 2019-06-24 NOTE — Progress Notes (Signed)
Patient discharged: Home with family  Discharge paperwork given: to patient and family  Reviewed with teach back  IV and telemetry disconnected  Belongings given to patient  Awaiting on ride.

## 2019-06-24 NOTE — Progress Notes (Signed)
PROGRESS NOTE  Logan White TGG:[id number] DOB: [birth year] DOA: 06/22/2019 PCP: Patient, No Pcp Per  Brief History   Logan White is a 78 y.o. male with medical history significant of AF on Eliquis, prediabetes/DM type II, HTN, HLD, AAA, CVA, PAD, and cardiac arrest secondary to V. fib.  Patient presents with complaints of progressively worsening shortness of breath over the last 2 weeks.  Notes that 2 weeks ago he was driven up to [place] by his son when he was supposed to stay these 2 weeks.  However, patient notes that he developed a gout flare right great toe and was having leg swelling.  His son's home had multiple flights of stairs and he was having difficulty and was therefore brought back home 5 days ago. Reports that they may have stopped 1 or 2 times each way.  He does not routinely check his weight, but felt like it was stable.  Patient had quit smoking cold [place], but recently started back up.  I not been able to sleep at night due to shortness of breath symptoms.  Associated symptoms include orthopnea, wheezing, and intermittently productive cough.  Denies having any significant fever, known COVID-19 exposure, chest pain, abdominal pain, nausea, vomiting, diarrhea, or recent trauma.  Patient has been taking his Eliquis as advised and his right great toe is no longer hurting him.   ED Course: Upon admission patient was noted to be tachypneic with O2 saturations as low as 85% and improved with 2 L of nasal cannula oxygen.  Labs significant for hemoglobin 12.5, BUN 32, BUN 32, creatinine 1.56, BNP 1512.4, high-sensitivity troponin 18->17.  Patient had been given 40 mg of Lasix IV, albuterol, and nicotine patch.  He has been admitted to a telemetry bed. Cardiology has been consulted. Echocardiogram has been repeated, and the patient has been started on diuresing.  Consultants  . Cardiology  Procedures  . None  Antibiotics   Anti-infectives (From admission, onward)   None     .  Subjective  The patient is resting comfortably. No new complaints. He remains short of breath.  Objective   Vitals:  Vitals:   06/24/19 1002 06/24/19 1150  BP: 113/88 112/77  Pulse: 73 86  Resp: 16 20  Temp: 98 F (36.7 C) (!) 97.5 F (36.4 C)  SpO2: 100% 99%   Exam:  Constitutional:  . The patient is awake, alert, and oriented x 3. No acute distress. Respiratory:  . No increased work of breathing. . No wheezes . Positive for scattered rales and rhonchi . No tactile fremitus Cardiovascular:  . Regular rate and rhythm . No murmurs, ectopy, or gallups. . No lateral PMI. No thrills. Abdomen:  . Abdomen is soft, non-tender, non-distended . No hernias, masses, or organomegaly . Normoactive bowel sounds.  Musculoskeletal:  . No cyanosis, clubbing, or edema Skin:  . No rashes, lesions, ulcers . palpation of skin: no induration or nodules Neurologic:  . CN 2-12 intact . Sensation all 4 extremities intact Psychiatric:  . Mental status o Mood, affect appropriate o Orientation to person, place, time  . judgment and insight appear intact  I have personally reviewed the following:   Today's Data  . Vitals, BMP, Iron studies, CBC  Cardiology Data  . EKG, Echocardiogram  Scheduled Meds: . apixaban  5 mg Oral BID  . atorvastatin  80 mg Oral Daily  . Chlorhexidine Gluconate Cloth  6 each Topical Q0600  . guaiFENesin  600 mg Oral BID  . [START  ON 06/25/2019] influenza vaccine adjuvanted  0.5 mL Intramuscular Tomorrow-1000  . isosorbide mononitrate  30 mg Oral Daily  . metoprolol succinate  100 mg Oral Daily  . mupirocin ointment  1 application Nasal BID  . nicotine  21 mg Transdermal Daily  . [START ON 06/25/2019] pneumococcal 23 valent vaccine  0.5 mL Intramuscular Tomorrow-1000  . potassium chloride  20 mEq Oral BID  . predniSONE  40 mg Oral Q breakfast  . sodium chloride flush  3 mL Intravenous Q12H   Continuous Infusions: . sodium chloride       Principal Problem:   Acute on chronic systolic CHF (congestive heart failure) (HCC) Active Problems:   TOBACCO ABUSE   Essential hypertension   CKD (chronic kidney disease), stage III   Chronic anticoagulation   AAA (abdominal aortic aneurysm) (HCC)   Acute on chronic systolic (congestive) heart failure (HCC)   Persistent atrial fibrillation (HCC)   LOS: 2 days   A & P  Acute respiratory failure with hypoxia secondary to acute on chronic systolic congestive heart failure: On admission O2 saturations noted to be in 80s.  Patient was placed on 2 L of nasal cannula oxygen improvement in O2 saturations.  Exam reveals at least 1+ pitting bilateral lower extremity edema..  Chest x-ray showing cardiomegaly with central opacities concerning for edema versus infectious process.  BNP was elevated 1512.4.  Last EF noted to be 35 to 40% on 05/29/2018 by echocardiogram.  Patient given 40 mg of Lasix IV in the ED. The patient has been admitted to a telemetry bed. Cardiology has been consulted and heart failure pathway initiated. The patient is receiving lasix 40 mg bid. Echocardiogram has been repeated and demonstrated a somewhat reduced EF at 30-35% with moderately decreased function. There is basal and mid inferolateral wall akinesis and hypokinesis of the anterior wall, basal and mid anterolateral wall, basal and mid anterior septum apical lateral segment. There is moderate reduction of systolic function in the RV globally with evidence of pulmonary hypertension. The patient is currently saturating 99% on room air.   Acute bronchitis: Patient complaints of having wheezing and has significant history of tobacco abuse.  Does not appear to have a formal diagnosis of COPD. The patient is receiving Prednisone 40 mg daily and as needed inhaler treatments. He is also receiving mucinex.  Elevated troponin: Acute.  Initial troponin mildly elevated 18, but repeat check within normal limits at 17.  Suspect secondary to  acute demand. A per cardilogy.  Essential hypertension: Blood pressures at the lower limit of normal on admission.  They continue to below. Metoprolol has been continued.  Blood pressures are controlled.  Microcytic anemia: Due to iron deficiency anemia. Supplement Hemoglobin 12.5 with low MCV.  Chronic kidney disease stage III: Baseline creatinine previously noted to be around 1.6-1.7 1.84 today. Monitor creatinine, electrolytes, and volume status.  AAA: Patient/AAA Doppler completed on 03/15/2019 noted aortic diameter remained unchanged at approximately 3 cm since last checked on 10/2017. Continue outpatient surveillance with Dr. [name]   Permanent atrial fibrillation on chronic anticoagulation. Continue Eliquis.  History of V. fib cardiac arrest, ischemic cardiomyopathy, CAD: Cardiology consulted. Patient status post PCI of his LAD in October 2014.  V. fib arrest in June 2013 related to hypokalemia and again in April 2014. Continue beta-blocker.  Tobacco abuse: the patient has received 7-10 minutes of tobacco cessation counseling. Nicotine patch has been made available.  I have seen and examined this patient myself. I have spent 34 minutes  in his evaluation and care.  DVT prophylaxis: Eliquis   Code Status: Full Family Communication: No family present at bedside Disposition Plan: Possible discharge home in 2 to 3 days   Krystiana Fornes, DO [facility] Direct contact: see [email or web address]  7PM-7AM contact night coverage as above 06/24/2019, 2:22 PM  LOS: 2 days

## 2019-06-24 NOTE — Plan of Care (Signed)

## 2019-06-24 NOTE — Progress Notes (Signed)
Patient discharged: Home with family  Via: Wheelchair   Discharge paperwork given: to patient and family  Reviewed with teach back  IV and telemetry disconnected  Belongings given to patient    

## 2019-06-24 NOTE — Consult Note (Addendum)
Cardiology Consultation:   Patient ID: Logan White; [id number]; Jul 21, 1941   Admit date: 06/22/2019 Date of Consult: 06/24/2019  Primary Care Provider: Patient, No Pcp Per Primary Cardiologist: Kirk Ruths, MD 05/04/2018 Primary Electrophysiologist:  None   Patient Profile:   Logan White is a 78 y.o. male with a hx of PCI LAD 2004, VF arrest 2013 felt 2nd K+ 2.2, VF arrest 2014 2nd MI w/ 100% CFX (PCI unsuccessful) and LAD stent patent w/ apical LAD 80%, EF 30-35% in 2014 & 2018, MV 2017 w/ scar but w/out ischemia, PAD w/ R-EIA & R-CFA 100% no PCI possible, L-SFA occluded no PCI possible, 40-59% carotid dz 2018, 3.2 cm infrarenal AAA, DM, HTN, HLD, permanent atrial fib on Eliquis, CKD III,  who is being seen today for the evaluation of CHF at the request of Dr Benny Lennert.  History of Present Illness:   [name] had not been seen by Dr. [name] since 05/04/2018.  At that time, he was doing well and his weight was 78.9 kg.  He called the office 06/19/2019 and that he was having problems with swelling and shortness of breath.  He came to the emergency room on 06/22/2019, and was admitted.  He was admitted by internal medicine and cardiology was asked to evaluate him.  He spent some time recently in [place] w/ his son. He did not eat out much, but did some. Had some crab cakes. Does not think he missed any Lasix doses up there.   He feels the SOB was related to having to walk up multiple flights of stairs in his son's home. He first noticed it then. Had to climb stairs all the time, garage, kitchen, bedrooms all on different floors.  He feels like climbing up the stairs was really too much for him.  He started noting increased dyspnea on exertion.  No chest pain.  His legs started swelling when in [place]. He says the long ride home made the swelling worse.   After he got back, he developed orthopnea and PND. Sx progressed to SOB at rest, that is when he decided to go to the ER.    He went to [place] on Saturday and was tx to [facility].  Put on Lasix 40 mg IV twice daily.  He has had 4 doses so far.  He diuresed and feels his respiratory status is at baseline.  He also got Solu-Medrol 125 mg x 1 and is on prednisone 40 mg a day.  He did not miss any doses of Eliquis while traveling.  He had a gout flare prior to [place] trip, it finally resolved. Did not miss any Lasix during that time.   He quit smoking a couple of weeks before he went to [place].  He has not started back.  He was put on a nicotine patch, and feels this is a good idea.  He currently feels he is breathing is back to baseline and wants to go home.  At no time during all of this did he have chest pain.  He feels his activity level has been good.  He runs a pool in the summer, and so was busy doing this, on his feet a lot.  He tolerated this well and did not feel he had any symptoms related to activity.   Past Medical History:  Diagnosis Date  . AAA (abdominal aortic aneurysm) ([place])    a. 08/2015 Abd U/S: 2.7 x 2.9 x 3.2 cm infrarenal AAA.  Marland Kitchen  Bradycardia    a. Requiring discontinuation of use of BB/CCB  . Cardiac arrest - ventricular fibrillation    a. 03/2012 in setting of hypokalemia (prolonged hosp with VDRF, tracheobronchitis, ARF, shock liver, PAF, AMS felt secondary to post-anoxic encephalopathy/shock).  . Carotid artery stenosis    a. 01/2011 - 40-59% bilateral stenosis;  b. 06/2015 Carotid U/S: 40-59% bilat ICA stenosis->f/u 6 mos.  . CKD (chronic kidney disease), stage III   . Coronary artery disease    a. s/p aborted ant STEMI tx with Cypher DES to LAD 10/04 (residual at cath: D1 50%, CFX 40% and multiple dist 70%, EF 55%);   b. myoview 3/10: Ef 47%, infero-apical isch, LOW RISK - med Tx recommended;  c. 12/2012 VF Arrest/Cath: LAD 40isr, 80 apical, LCX 100 (failed PTCA), RCA nondom.  . Diabetes mellitus   . Diverticulosis    2001  . Diverticulosis 2001  . Elevated LFTs    Shock  liver 03/2012  . H/O: CVA (cardiovascular accident)   . Hematemesis    a. 12/2010 felt 2/2 Mallory Weiss tear - pt could not afford colonscopy/EGD at that time so Coumadin was deferred. Coumadin initiated 03/2012 without any evidence for bleeding.  Marland Kitchen History of Ischemic Cardiomyopathy    a. EF 50-55% by echo 03/09/12 (was 30% by echo 02/24/12); b. 03/2013 Echo: EF 55-60%, mild MR, sev dil LA, mod dil RA, PASP 5mHg.  .Marland KitchenHypertensive heart disease    a. echo 4/12: EF 50%, asymmetric septal hypertrophy, no SAM or LVOT gradient, LAE, PASP 35  . Inguinal hernia   . Inguinal hernia without mention of obstruction or gangrene, unilateral or unspecified, (not specified as recurrent)   . 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 Mitral regurgitation    a. Mild by echo 03/2012 and 03/2013.  . Mixed Hyperlipidemia   . Peripheral arterial disease ([place]   . Peripheral vascular disease ([place]    a. h/o LE angioplasty;  b. 08/2015 Duplex: >50% RCIA, 100% REIA, >50% LEIA, elev vel in SMA, patent IVC.  .Marland KitchenPersistent atrial fibrillation ([place]    a. Dx 12/2010-->coumadin (CHA2DS2VASc = 7).  . QT prolongation     Past Surgical History:  Procedure Laterality Date  . HERNIA REPAIR    . LEFT HEART CATH N/A 12/30/2012   Procedure: LEFT HEART CATH;  Surgeon: DLeonie Man MD;  [facility];  Service: Cardiovascular;  Laterality: N/A;  . ORIF TIBIA & FIBULA FRACTURES  01/11/07   OPEN TX OF UNICONDYLAR PLATEAU FRACTURE, IRRIGATION/DEBRIDEMENT OF OPEN FRACTURE INCLUDING BONE, REMOVAL OF EXTERNAL FIXATOR UNDER ANESTHESIA PER DR. MICHAEL HANDY  . PERIPHERAL VASCULAR CATHETERIZATION N/A 12/21/2015   Procedure: Lower Extremity Angiography;  Surgeon: JLorretta Harp MD;  [facility];  Service: Cardiovascular;  Laterality: N/A;  . POPLITEAL ARTERY ANGIOPLASTY  01/07/07   s/p LEFT POPLITEAL ARTERY EXPLORATION AND VEIN PATCH ANGIOPLASTY, PER DR. EARLY, SECONDARY TO ISCHEMIC  LEFT FOOT RELATED TO LEFT POPLITEAL ARTERY INJURY  . SKIN GRAFT       Prior to Admission medications   Medication Sig Start Date End Date Taking? Authorizing Provider  apixaban (ELIQUIS) 5 MG TABS tablet Take 1 tablet (5 mg total) by mouth 2 (two) times daily. 06/08/18  Yes CLelon Perla MD  atorvastatin (LIPITOR) 80 MG tablet TAKE 1 TABLET BY MOUTH EVERY DAY 02/22/19  Yes Crenshaw, BDenice Bors MD  furosemide (LASIX) 20 MG tablet TAKE 2 TABLETS BY MOUTH EVERY DAY,  NEED APPIONTMENT FOR ADDITIONAL REFILLS Patient taking differently: Take 40 mg by mouth daily.  05/07/19  Yes Lelon Perla, MD  hydrALAZINE (APRESOLINE) 10 MG tablet Take 1 tablet (10 mg total) by mouth 3 (three) times daily. *NEEDS OFFICE VISIT* 2nd attempt 06/03/19  Yes Crenshaw, Denice Bors, MD  KLOR-CON M20 20 MEQ tablet TAKE 1 TABLET BY MOUTH TWICE A DAY Patient taking differently: Take 40 mEq by mouth daily.  07/16/18  Yes Lelon Perla, MD  losartan (COZAAR) 50 MG tablet Take 1 tablet (50 mg total) by mouth daily. 06/13/18 06/23/19 Yes Lelon Perla, MD  metoprolol succinate (TOPROL-XL) 100 MG 24 hr tablet TAKE 1 TABLET (100 MG TOTAL) BY MOUTH DAILY. *NEEDS OFFICE VISIT FOR FURTHER REFILLS* 06/03/19  Yes Crenshaw, Denice Bors, MD  nitroGLYCERIN (NITROSTAT) 0.4 MG SL tablet Place 1 tablet (0.4 mg total) under the tongue every 5 (five) minutes as needed for chest pain (Up to 3 doses). 01/11/13  Yes Theora Gianotti, NP  ondansetron (ZOFRAN) 4 MG tablet Take 1 tablet (4 mg total) by mouth every 6 (six) hours as needed for nausea. Patient not taking: Reported on 76/22/6333 5/45/62   Delora Fuel, MD  famotidine (PEPCID) 20 MG tablet Take 1 tablet (20 mg total) by mouth 2 (two) times daily. 01/13/11 11/25/11  Richardson Dopp T, PA-C    Inpatient Medications: Scheduled Meds: . apixaban  5 mg Oral BID  . atorvastatin  80 mg Oral Daily  . Chlorhexidine Gluconate Cloth  6 each Topical Q0600  . furosemide  40 mg Intravenous BID   . guaiFENesin  600 mg Oral BID  . [START ON 06/25/2019] influenza vaccine adjuvanted  0.5 mL Intramuscular Tomorrow-1000  . metoprolol succinate  100 mg Oral Daily  . mupirocin ointment  1 application Nasal BID  . nicotine  21 mg Transdermal Daily  . [START ON 06/25/2019] pneumococcal 23 valent vaccine  0.5 mL Intramuscular Tomorrow-1000  . potassium chloride  20 mEq Oral BID  . predniSONE  40 mg Oral Q breakfast  . sodium chloride flush  3 mL Intravenous Q12H   Continuous Infusions: . sodium chloride     PRN Meds: sodium chloride, acetaminophen, albuterol, ondansetron (ZOFRAN) IV, sodium chloride flush  Allergies:   No Known Allergies  Social History:   Social History   Socioeconomic History  . Marital status: Married    Spouse name: Not on file  . Number of children: 4  . Years of education: Not on file  . Highest education level: Not on file  Occupational History  . 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 Financial resource strain: Not on file  . Food insecurity    Worry: Not on file    Inability: Not on file  . Transportation needs    Medical: Not on file    Non-medical: Not on file  Tobacco Use  . Smoking status: Current Some Day Smoker    Packs/day: 0.50    Years: 30.00    Pack years: 15.00    Types: Cigarettes    Last attempt to quit: 03/12/2012    Years since quitting: 7.2  . Smokeless tobacco: Never Used  Substance and Sexual Activity  . Alcohol use: No    Alcohol/week: 0.0 standard drinks  . Drug use: No    Frequency: 2.0 times per week  . Sexual activity: Not on file  Lifestyle  . Physical activity    Days  per week: Not on file    Minutes per session: Not on file  . Stress: Not on file  Relationships  . Social Herbalist on phone: Not on file    Gets together: Not on file    Attends religious service: Not on file    Active member of club or organization: Not on file    Attends meetings of  clubs or organizations: Not on file    Relationship status: Not on file  . Intimate partner violence    Fear of current or ex partner: Not on file    Emotionally abused: Not on file    Physically abused: Not on file    Forced sexual activity: Not on file  Other Topics Concern  . 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 YES. 1/2 -1 PPD OF CIGARETTES    NO ETOH    Family History:   Family History  Problem Relation Age of Onset  . Heart disease Father        ALSO UNCLE DIED HAD CAD  . Heart failure Father   . Brain cancer Mother   . Leukemia Mother   . Sickle cell anemia Mother   . Sickle cell anemia Other    Family Status:  Family Status  Relation Name Status  . Father  Deceased  . Mother leu Deceased  . Other  (Not Specified)  . MGM  Deceased  . MGF  Deceased  . PGM  Deceased  . PGF  Deceased    ROS:  Please see the history of present illness.  All other ROS reviewed and negative.     Physical Exam/Data:   Vitals:   06/24/19 0052 06/24/19 0417 06/24/19 0422 06/24/19 1002  BP: 105/65  (!) 110/57 113/88  Pulse: 90  (!) 58 73  Resp: '17  18 16  ' Temp: 98 F (36.7 C)  97.9 F (36.6 C) 98 F (36.7 C)  TempSrc: Oral  Oral Oral  SpO2: 91%  100% 100%  Weight:  75.6 kg    Height:        Intake/Output Summary (Last 24 hours) at 06/24/2019 1054 Last data filed at 06/24/2019 1025 Gross per 24 hour  Intake 843 ml  Output 1350 ml  Net -507 ml   Filed Weights   06/22/19 1826 06/23/19 0635 06/24/19 0417  Weight: 73 kg 76.7 kg 75.6 kg   Body mass index is 20.82 kg/m.  General:  Well nourished, well developed, male in no acute distress HEENT: normal Lymph: no adenopathy Neck: JVD -minimal elevation Endocrine:  No thryomegaly Vascular: No carotid bruits; upper extremity pulses 2+, lower extremity pulses decreased, capillary refill somewhat delayed Cardiac:   normal S1, S2; irregular rate and rhythm; no murmur  Lungs: Few basilar rales and rare rhonchi bilaterally, no wheezing  Abd: soft, nontender, no hepatomegaly  Ext: no edema Musculoskeletal:  No deformities, BUE and BLE strength normal and equal Skin: warm and dry  Neuro:  CNs 2-12 intact, no focal abnormalities noted Psych:  Normal affect   EKG:  The EKG was personally 06/22/2019 reviewed and demonstrates: Atrial fib, LVH, heart rate 77, lateral T wave inversions are similar to 11/27/2017 ECG Telemetry:  Telemetry was personally reviewed and demonstrates: Atrial fibrillation, rate generally close to 100   CV studies:   ECHO: 06/23/2019  1. Left ventricular ejection fraction, by visual estimation, is 30 to 35%. The left ventricle has moderately decreased function. Moderately increased left ventricular size. There is no left ventricular hypertrophy.  2. Multiple segmental abnormalities exist. See findings.  3. Left ventricular diastolic function could not be evaluated pattern of LV diastolic filling.  4. Global right ventricle has moderately reduced systolic function.The right ventricular size is mildly enlarged. No increase in right ventricular wall thickness.  5. Left atrial size was severely dilated.  6. Right atrial size was moderately dilated.  7. Moderate mitral annular calcification.  8. The mitral valve is normal in structure. Severe mitral valve regurgitation.  9. The tricuspid valve is normal in structure. Tricuspid valve regurgitation is mild. 10. The aortic valve is normal in structure. Aortic valve regurgitation is mild by color flow Doppler. Mild aortic valve sclerosis without stenosis. 11. The pulmonic valve was normal in structure. Pulmonic valve regurgitation is mild by color flow Doppler. 12. Moderately elevated pulmonary artery systolic pressure. 13. The inferior vena cava is dilated in size with <50% respiratory variability, suggesting right atrial pressure of 15 mmHg.   Left Ventricle: Left ventricular ejection fraction, by visual estimation, is 30 to 35%. The left ventricle has moderately decreased function. There is no left ventricular hypertrophy. Moderately increased left ventricular size. The left ventricular  diastology could not be evaluatedsecondary to atrial fibrillation. Spectral Doppler shows Left ventricular diastolic function could not be evaluated pattern of LV diastolic filling. LV Wall Scoring: The basal and mid inferolateral wall is akinetic. The entire anterior wall, basal and mid anterolateral wall, basal and mid anterior septum, apical lateral segment, and apex are hypokinetic. All remaining scored segments are normal.  MYOVIEW: 10/06/2015  No T wave inversion was noted during stress.  There was no ST segment deviation noted during stress.  Defect 1: There is a small defect of moderate severity.  Findings consistent with prior myocardial infarction.  Small size, moderate intensity fixed apical and inferior wall defect, which may represent scar or apical thinning. The LV is markedly dilated at 200 ml (stress) and 158 ml (rest) - although the study was not gated, this suggests the possibility of dilated cardiomyopathy. No reversible ischemia is noted. This is a low risk stress test from an ischemia standpoint.   CATH: 12/31/2012 Coronary Anatomy:  Left Main: Large-caliber vessel that bifurcates into the LAD and circumflex, mildly calcified but no significant lesions.  LAD: Large-caliber vessel gives rise to proximal moderate sized septal perforator and first diagonal prior to the previously placed Cypher DES  3.5 mm x 18 mm postdilated to 4.0 mm. The distal portion of the stent has a roughly 40% in-stent restenosis that extends beyond the stent. Distal flow the LAD is TIMI 1-2 pulsatile flow showing 2 smaller distal diagonal. The vessel wraps the apex and there is an apical eccentric roughly 80% lesion. The inferoapical vessel courses about a  third of the way down the posterior septum   Left Circumflex:  large-caliber dominant vessel that gives rise to a very proximal OM1 then 2 small branches before it is occluded in the AV groove.   OM1: Large-caliber vessel that reaches almost all again the apex bifurcating distally. Minimal luminal irregularities.  On reviewing the angiography from 2004, there was one small OM 2 followed by moderate caliber LPL and L. PDA. There is diffuse disease in these vessels a roughly 70%.   After PTCA at the occlusion site and downstream slightly only the small OM 2  was visualized and no flow down the distal vessel was able to restore.     RCA: Small caliber nondominant vessel with only 2 RV marginal branches.  Percutaneous Coronary Intervention:   Guide: 6 Fr   XB 3.5   Guidewire:  pro-water  Predilation Balloon:  Emerge 2.5 mm x 12  mm;   6  Atm x 30  Sec, 10  Atm x 30  Sec --> minimal downstream flow noted however the wire did move freely into what appeared to be an OM branch.  As this area did appear to be somewhat calcified, a scoring balloon was chosen next. Predilation Balloon:  Emerge 2.5 mm x 12  mm; Stent: AngioSculpt scoring balloon   2.0 mm x 10  mm;  ? 6  Atm x 30  Sec, 8  Atm x 45  Sec ? TIMI 1 flow was restored into what now is clearly only a small OM 2. ?  Pre--dilation Balloon#2 :  Quantum   2.0 mm x 12  mm;  ? 8  Atm x 45  Sec, followed by 2 more proximal inflations at 8 Atm x 30 Sec   Post  inflation angiography in multiple views, with and without guidewire in place revealed TIMI 2-3 flow down a trivial sized obtuse marginal. The wire was not successfully advanced into the mainstream circumflex. At this time, I determined that this is most likely a chronic total occlusion, and not the culprit for this acute event. The decision was made to abort the intervention As the patient was now hemodynamic stable in normal sinus rhythm with a relatively normal blood pressure and off  dopamine. POST-OPERATIVE DIAGNOSIS:    As the circumflex occlusion was most likely chronic total occlusion and not acute, no obvious culprit lesion for tonight's event could be identified besides the distal/apical LAD lesion.  Relatively normal LVEDP and stable blood pressures and heart rhythm.  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 abdominal aortogram            2. Bilateral iliac angiogram            3. Left lower extremity runoff Angiographic Data:  1: Abdominal aortic rash fluoroscopically calcified and not atherosclerotic aneurysm 2: Right lower extremity-the right external iliac artery was occludedafter the takeoff of the hypogastric and filled by collaterals in the right common femoral artery level which was diffusely diseased. The right SFA appeared to be patent. 3: Left lower extremity-there was an 80% eccentric focal left common femoral artery stenosis without a pullback gradient. The left SFA was occluded at its origin and reconstituted in the adductor canal. Profunda femoris collaterals. There was an old nitinol self-expanding stent in the mid left SFA a which was occluded. There was one-vessel runoff peroneal.  IMPRESSION:occluded  Right external iliac artery not percutaneously addressable. Occluded left SFA not percutaneously addressable.. Continued medical therapy will be recommended. A total of 65 mL of contrast was administered to the patient. The sheath was removed and pressure held. The patient will be discharged home today and will restart Coumadin.  Laboratory Data:   Chemistry Recent Labs  Lab 06/22/19 1844 06/23/19 1033 06/24/19 0436  NA 136 136 136  K 4.4 4.3 4.8  CL 103 102 103  CO2 23 22 22  GLUCOSE 103* 157* 152*  BUN 32* 36* 43*  CREATININE 1.56* 1.79* 1.84*  CALCIUM 8.7* 8.8* 9.1   GFRNONAA 43* 36* 35*  GFRAA 49* 42* 40*  ANIONGAP '10 12 11    ' Lab Results  Component Value Date   ALT 38 06/23/2019   AST 40 06/23/2019   ALKPHOS 146 (H) 06/23/2019   BILITOT 1.5 (H) 06/23/2019   Hematology Recent Labs  Lab 06/22/19 1844 06/24/19 0436  WBC 6.1 8.2  RBC 4.65 4.87  HGB 12.5* 12.9*  HCT 36.3* 37.1*  MCV 78.1* 76.2*  MCH 26.9 26.5  MCHC 34.4 34.8  RDW 15.1 14.9  PLT 251 298   Cardiac Enzymes High Sensitivity Troponin:   Recent Labs  Lab 06/22/19 1844 06/22/19 2049  TROPONINIHS 18* 17     BNP Recent Labs  Lab 06/22/19 1844  BNP 1,512.4*    TSH:  Lab Results  Component Value Date   TSH 2.46 04/23/2012   Lipids: Lab Results  Component Value Date   CHOL 127 06/25/2018   HDL 30 (L) 06/25/2018   LDLCALC 74 06/25/2018   LDLDIRECT 76.3 08/13/2010   TRIG 117 06/25/2018   CHOLHDL 4.2 06/25/2018   HgbA1c: Lab Results  Component Value Date   HGBA1C 6.0 (H) 12/31/2012   Magnesium:  Magnesium  Date Value Ref Range Status  06/23/2019 2.0 1.7 - 2.4 mg/dL Final    Comment:    Performed at [facility], [place] [address], [place], [place] [address]    Radiology/Studies:  Dg Chest 2 View  Result Date: 06/22/2019 CLINICAL DATA:  Multiple cardiac issues. Two cardiac arrest. Shortness of breath. EXAM: CHEST - 2 VIEW COMPARISON:  Jan 10, 2013 FINDINGS: Stable cardiomegaly. Right greater than left central pulmonary opacities. No pneumothorax. No other abnormalities. IMPRESSION: 1. The central opacities, right greater than left, may represent asymmetric edema versus an infectious process. Recommend clinical correlation and follow-up imaging to ensure resolution. Electronically Signed   By: Dorise Bullion III M.D   On: 06/22/2019 19:16    Assessment and Plan:   1. Acute on chronic systolic CHF: -His weight is down 3 kg and his respiratory status is improved. -His EF is a little bit lower than previous.  -He reports compliance with his  medications, but had not been back to the office in over a year -Breathing much better after diuresis, feel we could change him back to p.o. Lasix -He was on Toprol-XL 100 mg qd, hydralazine 10 mg tid & losartan 50 mg qd at home. -The losartan and hydralazine are on hold, but he is on his home dose of Toprol-XL -Will try adding Imdur 30 mg daily and see how tolerated, this would benefit him from both a heart failure and CAD perspective.  2.  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 - Ez were negative for MI -Dr. Harrington Challenger to review his cath report and echo and decide if any ischemic evaluation is indicated -Otherwise, continue statin and beta-blocker.  No aspirin secondary to Eliquis  3.  Permanent atrial fibrillation: -Rate control is likely the only viable option, based on his atrial size on echo -He has not had bradycardia or significant pauses on Toprol-XL 100 mg daily -There was a note regarding bradycardia on beta-blockers, so will not further increase the metoprolol  4.  Hypertension: -Blood pressure is well controlled on current therapy  5.  CKD, stage III -BUN/creatinine have increased with diuresis -Will DC IV Lasix for now, MD to review and advise on p.o. Lasix dosing -Losartan is also on hold  6.  Chronic anticoagulation with Eliquis - CHA2DS2-VASc = 8 (age x 2, CHF, CAD, CVA x 2, HTN, DM) -Per patient, does not think he missed any doses of Eliquis while traveling -Emphasized compliance  Otherwise, per IM Principal Problem:   Acute on chronic systolic CHF (congestive heart failure) (HCC) Active Problems:   TOBACCO ABUSE   Essential hypertension   CKD (chronic kidney disease), stage III   Chronic anticoagulation   AAA (abdominal aortic aneurysm) (HCC)   Acute on chronic systolic (congestive) heart  failure (HCC)   Persistent atrial fibrillation ([place])     For questions or updates, please contact [facility] Please consult [email or web address] for contact info under Cardiology/STEMI.   Signed, Rosaria Ferries, PA-C  06/24/2019 10:54 AM  Pt seen and examined   I agree with findings as noted above by R Barrett Pt is a 78 yo with hx of CAD, systolic CHF, permenant atrial fibrillation, HTN and CKD  Just got back home from spending time in [place] with his son    Admits to going up and down stairs a lot which is new for him   When he went up to [place] he was feeling good, breathign steady   While there he noted increased SOB  Wheezing  Came home   Denies CP   Says breathing was worse than the past  Came to [facility]   Since admit he has been diuresed and says his breathign is better   Admits to eating what he wants.   Doesn't watch salt   Desnt weigh himself.  On exam:  JVP is increased Lungs with mild wheezing on forced expriration.   No rales Cardiac exam  Irreg irreg   No S3  No murmurs Abd is supple    Ext with Tr to 1+ edema  Labs:  Cr bumped to 1.8   Echo as noted above   LVEF and RVEF are mildly down from prvious    MR appears mod to severe   IVC is dilted more   (echo done prior to full iduresis)  Impression:   Acute on chronic systolic CHF    Probably related to diet    Pt does not watch Na intake   Wants a philly cheese steak sandwich from [employer] for dinner  He still has some extra fluiid but better than admit   Still wheezing.   REcomm:   Pt needs additional diuresis   He is comfortable and sats mostly above 90%   Volume is not severely elevated     I think it is OK for him to be discharged with Lasix 60 mg every other day with 20 KCL   F/U in clinic on Friday for labs and visit  I have told him about scale   He needs to get one for AM weights  WIll need appt late this week   NEEDS to watch salt (2-2.5 Grams per day ONLY()    2.   Atrial  fibrillation/flutter   Rate control  3  CAD   I a mnot convinced of active angina    4  HTN   BP is OK   Keep on current regimen  We discussed salt and fluid restriction.  Dorris Carnes MD

## 2019-06-24 NOTE — Progress Notes (Signed)
SATURATION QUALIFICATIONS: (This note is used to comply with regulatory documentation for home oxygen)  Patient Saturations on Room Air at Rest = 98%  Patient Saturations on Room Air while Ambulating = 95%  Patient oxygen dropped just for few seconds down to 89% and immediately went back up to 95%.   Dr. Harrington Challenger notified.

## 2019-06-25 ENCOUNTER — Other Ambulatory Visit: Payer: Self-pay

## 2019-06-25 NOTE — Progress Notes (Signed)
This encounter was created in error - please disregard.

## 2019-06-25 NOTE — Addendum Note (Signed)
Addended by: Oswaldo Done on: 60/47/9987 11:35 AM   Modules accepted: Level of Service, SmartSet

## 2019-06-25 NOTE — Telephone Encounter (Signed)
Patient was seen in the ER 10/17 

## 2019-06-25 NOTE — Addendum Note (Signed)
Addended by: Oswaldo Done on: 64/84/7207 11:37 AM   Modules accepted: Level of Service, SmartSet

## 2019-06-25 NOTE — Discharge Summary (Signed)
Physician Discharge Summary  Logan White JOI:[id number] DOB: 1941-03-01 DOA: 06/22/2019  PCP: Patient, No Pcp Per  Admit date: 06/22/2019 Discharge date: 06/25/2019  Recommendations for Outpatient Follow-up:  1. Follow up with PCP in 7-10 days. 2. BMP to be drawn on 07/01/2019 and reported to PCP. 3. Follow up with cardiology as directed.  Discharge Diagnoses: Principal diagnosis is #1 1. Acute respiratory failure 2. Acute bronchitis 3. Elevated troponin 4. Hypertension 5. Microcytic anemia 6. CKD III 7. AAA 8. Permanent atrial fibrillation 9. History of V fib cardiac arrest  Discharge Condition: Fair  Disposition: Home  Diet recommendation: Heart Healthy  Filed Weights   06/22/19 1826 06/23/19 0635 06/24/19 0417  Weight: 73 kg 76.7 kg 75.6 kg    History of present illness:  Logan White is a 79 y.o. male with medical history significant of AF on Eliquis, prediabetes/DM type II, HTN, HLD, AAA, CVA, PAD, and cardiac arrest secondary to V. fib.  Patient presents with complaints of progressively worsening shortness of breath over the last 2 weeks.  Notes that 2 weeks ago he was driven up to [place] by his son when he was supposed to stay these 2 weeks.  However, patient notes that he developed a gout flare right great toe and was having leg swelling.  His son's home had multiple flights of stairs and he was having difficulty and was therefore brought back home 5 days ago. Reports that they may have stopped 1 or 2 times each way.  He does not routinely check his weight, but felt like it was stable.  Patient had quit smoking cold [place], but recently started back up.  I not been able to sleep at night due to shortness of breath symptoms.  Associated symptoms include orthopnea, wheezing, and intermittently productive cough.  Denies having any significant fever, known COVID-19 exposure, chest pain, abdominal pain, nausea, vomiting, diarrhea, or recent trauma.  Patient has  been taking his Eliquis as advised and his right great toe is no longer hurting him.   ED Course: Upon admission patient was noted to be tachypneic with O2 saturations as low as 85% and improved with 2 L of nasal cannula oxygen.  Labs significant for hemoglobin 12.5, BUN 32, BUN 32, creatinine 1.56, BNP 1512.4, high-sensitivity troponin 18->17.  Patient had been given 40 mg of Lasix IV, albuterol, and nicotine patch.  Hospital Course:  Logan White a 78 y.o.malewith medical history significant of AF on Eliquis, prediabetes/DM type II, HTN, HLD, AAA, CVA, PAD, and cardiac arrest secondary to V. fib. Patient presents with complaints of progressively worsening shortness of breath over the last 2 weeks. Notes that 2 weeks ago he was driven up to [place] by his son when he was supposed to stay these 2 weeks. However, patient notes that he developed a gout flare right great toe and was having leg swelling. His son's home had multiple flights of stairs and he was having difficulty and was therefore brought back home 5 days ago. Reports that they may have stopped 1 or 2 times each way. He does not routinely check his weight, but felt like it was stable. Patient had quit smoking cold [place], but recently started back up. I not been able to sleep at night due to shortness of breath symptoms. Associated symptoms include orthopnea, wheezing, and intermittently productive cough. Denies having any significant fever, known COVID-19 exposure, chest pain, abdominal pain, nausea, vomiting, diarrhea, or recent trauma. Patient has been taking his Eliquis as  advised and his right great toe is no longer hurting him.   ED Course:Upon admission patient was noted to be tachypneic with O2 saturations as low as 85% and improved with 2 L of nasal cannula oxygen. Labs significant for hemoglobin 12.5, BUN 32, BUN 32, creatinine 1.56, BNP 1512.4, high-sensitivity troponin 18->17. Patient had been given 40 mg  of Lasix IV, albuterol, and nicotine patch.  He has been admitted to a telemetry bed. Cardiology has been consulted. Echocardiogram has been repeated, and the patient has been started on diuresing.   Today's assessment: S: The patient is resting comfortably. No new complaints. O: Vitals:  Vitals:   06/24/19 1150 06/24/19 1636  BP: 112/77 101/71  Pulse: 86 86  Resp: 20 16  Temp: (!) 97.5 F (36.4 C) 97.7 F (36.5 C)  SpO2: 99% 100%   Constitutional:   The patient is awake, alert, and oriented x 3. No acute distress. Respiratory:   No increased work of breathing.  No wheezes  Positive for scattered rales and rhonchi  No tactile fremitus Cardiovascular:   Regular rate and rhythm  No murmurs, ectopy, or gallups.  No lateral PMI. No thrills. Abdomen:   Abdomen is soft, non-tender, non-distended  No hernias, masses, or organomegaly  Normoactive bowel sounds.  Musculoskeletal:   No cyanosis, clubbing, or edema Skin:   No rashes, lesions, ulcers  palpation of skin: no induration or nodules Neurologic:   CN 2-12 intact  Sensation all 4 extremities intact Psychiatric:   Mental status ? Mood, affect appropriate ? Orientation to person, place, time   judgment and insight appear intact Discharge Instructions  Discharge Instructions    (HEART FAILURE PATIENTS) Call MD:  Anytime you have any of the following symptoms: 1) 3 pound weight gain in 24 hours or 5 pounds in 1 week 2) shortness of breath, with or without a dry hacking cough 3) swelling in the hands, feet or stomach 4) if you have to sleep on extra pillows at night in order to breathe.   Complete by: As directed    Call MD for:  difficulty breathing, headache or visual disturbances   Complete by: As directed    Call MD for:  extreme fatigue   Complete by: As directed    Call MD for:  persistant dizziness or light-headedness   Complete by: As directed    Diet - low sodium heart healthy   Complete  by: As directed    Discharge instructions   Complete by: As directed    Follow up with PCP in 7-10 days. BMP to be drawn on 07/01/2019 and reported to PCP. Follow up with cardiology as directed.   Heart Failure patients record your daily weight using the same scale at the same time of day   Complete by: As directed    Increase activity slowly   Complete by: As directed      Allergies as of 06/24/2019   No Known Allergies     Medication List    STOP taking these medications   hydrALAZINE 10 MG tablet Commonly known as: APRESOLINE   losartan 50 MG tablet Commonly known as: COZAAR   ondansetron 4 MG tablet Commonly known as: ZOFRAN     TAKE these medications   apixaban 5 MG Tabs tablet Commonly known as: ELIQUIS Take 1 tablet (5 mg total) by mouth 2 (two) times daily.   atorvastatin 80 MG tablet Commonly known as: LIPITOR TAKE 1 TABLET BY MOUTH EVERY DAY  furosemide 20 MG tablet Commonly known as: LASIX Take 3 tablets (60 mg total) by mouth every other day. What changed: See the new instructions.   isosorbide mononitrate 30 MG 24 hr tablet Commonly known as: IMDUR Take 1 tablet (30 mg total) by mouth daily.   metoprolol succinate 100 MG 24 hr tablet Commonly known as: TOPROL-XL TAKE 1 TABLET (100 MG TOTAL) BY MOUTH DAILY. *NEEDS OFFICE VISIT FOR FURTHER REFILLS*   nicotine 21 mg/24hr patch Commonly known as: NICODERM CQ - dosed in mg/24 hours Place 1 patch (21 mg total) onto the skin daily.   nitroGLYCERIN 0.4 MG SL tablet Commonly known as: Nitrostat Place 1 tablet (0.4 mg total) under the tongue every 5 (five) minutes as needed for chest pain (Up to 3 doses).   potassium chloride SA 20 MEQ tablet Commonly known as: Klor-Con M20 Take 1 tablet (20 mEq total) by mouth every other day. Take on days when taking lasix 60 mg. What changed:   how much to take  when to take this  additional instructions      No Known Allergies  The results of  significant diagnostics from this hospitalization (including imaging, microbiology, ancillary and laboratory) are listed below for reference.    Significant Diagnostic Studies: Dg Chest 2 View  Result Date: 06/22/2019 CLINICAL DATA:  Multiple cardiac issues. Two cardiac arrest. Shortness of breath. EXAM: CHEST - 2 VIEW COMPARISON:  Jan 10, 2013 FINDINGS: Stable cardiomegaly. Right greater than left central pulmonary opacities. No pneumothorax. No other abnormalities. IMPRESSION: 1. The central opacities, right greater than left, may represent asymmetric edema versus an infectious process. Recommend clinical correlation and follow-up imaging to ensure resolution. Electronically Signed   By: Dorise Bullion III M.D   On: 06/22/2019 19:16    Microbiology: Recent Results (from the past 240 hour(s))  SARS CORONAVIRUS 2 (TAT 6-24 HRS) Nasopharyngeal Nasopharyngeal Swab     Status: None   Collection Time: 06/22/19  7:55 PM   Specimen: Nasopharyngeal Swab  Result Value Ref Range Status   SARS Coronavirus 2 NEGATIVE NEGATIVE Final    Comment: (NOTE) SARS-CoV-2 target nucleic acids are NOT DETECTED. The SARS-CoV-2 RNA is generally detectable in upper and lower respiratory specimens during the acute phase of infection. Negative results do not preclude SARS-CoV-2 infection, do not rule out co-infections with other pathogens, and should not be used as the sole basis for treatment or other patient management decisions. Negative results must be combined with clinical observations, patient history, and epidemiological information. The expected result is Negative. Fact Sheet for Patients: SugarRoll.be Fact Sheet for Healthcare Providers: https://www.woods-mathews.com/ This test is not yet approved or cleared by the [place] FDA and  has been authorized for detection and/or diagnosis of SARS-CoV-2 by FDA under an Emergency Use Authorization (EUA). This EUA  will remain  in effect (meaning this test can be used) for the duration of the COVID-19 declaration under Section 56 4(b)(1) of the Act, 21 U.S.C. section 360bbb-3(b)(1), unless the authorization is terminated or revoked sooner. Performed at [facility], [place] [address] [place], [place] [address]   MRSA PCR Screening     Status: Abnormal   Collection Time: 06/23/19  9:42 AM   Specimen: Nasopharyngeal  Result Value Ref Range Status   MRSA by PCR POSITIVE (A) NEGATIVE Final    Comment:        The GeneXpert MRSA Assay (FDA approved for NASAL specimens only), is one component of a comprehensive MRSA colonization surveillance  program. It is not intended to diagnose MRSA infection nor to guide or monitor treatment for MRSA infections. RESULT CALLED TO, READ BACK BY AND VERIFIED WITH: RN [facility] CVIJETIC [id number] FCP Performed at [facility] [address], [place], [place] [address]      Labs: Basic Metabolic Panel: Recent Labs  Lab 06/22/19 1844 06/23/19 1033 06/24/19 0436  NA 136 136 136  K 4.4 4.3 4.8  CL 103 102 103  CO2 23 22 22   GLUCOSE 103* 157* 152*  BUN 32* 36* 43*  CREATININE 1.56* 1.79* 1.84*  CALCIUM 8.7* 8.8* 9.1  MG  --  2.0  --    Liver Function Tests: Recent Labs  Lab 06/22/19 1844 06/23/19 1033  AST 49* 40  ALT 39 38  ALKPHOS 146* 146*  BILITOT 1.5* 1.5*  PROT 6.9 6.6  ALBUMIN 3.4* 3.1*   No results for input(s): LIPASE, AMYLASE in the last 168 hours. No results for input(s): AMMONIA in the last 168 hours. CBC: Recent Labs  Lab 06/22/19 1844 06/24/19 0436  WBC 6.1 8.2  NEUTROABS 2.6 6.7  HGB 12.5* 12.9*  HCT 36.3* 37.1*  MCV 78.1* 76.2*  PLT 251 298   Cardiac Enzymes: No results for input(s): CKTOTAL, CKMB, CKMBINDEX, TROPONINI in the last 168 hours. BNP: BNP (last 3 results) Recent Labs    06/22/19 1844  BNP 1,512.4*    ProBNP (last 3 results) No results for input(s): PROBNP in the last 8760  hours.  CBG: No results for input(s): GLUCAP in the last 168 hours.  Principal Problem:   Acute on chronic systolic CHF (congestive heart failure) (HCC) Active Problems:   TOBACCO ABUSE   Essential hypertension   CKD (chronic kidney disease), stage III   Chronic anticoagulation   AAA (abdominal aortic aneurysm) (HCC)   Acute on chronic systolic (congestive) heart failure (HCC)   Persistent atrial fibrillation ([place])   Time coordinating discharge: 38 minutes.  Signed:        Quiera Diffee, DO [facility]  06/25/2019, 5:50 PM

## 2019-06-27 ENCOUNTER — Other Ambulatory Visit: Payer: Self-pay

## 2019-06-27 ENCOUNTER — Encounter: Payer: Self-pay | Admitting: Physician Assistant

## 2019-06-27 ENCOUNTER — Ambulatory Visit (INDEPENDENT_AMBULATORY_CARE_PROVIDER_SITE_OTHER): Payer: Medicare HMO | Admitting: Physician Assistant

## 2019-06-27 VITALS — BP 118/50 | HR 82 | Temp 98.7°F | Ht 75.0 in | Wt 170.2 lb

## 2019-06-27 DIAGNOSIS — I5023 Acute on chronic systolic (congestive) heart failure: Secondary | ICD-10-CM

## 2019-06-27 DIAGNOSIS — I4821 Permanent atrial fibrillation: Secondary | ICD-10-CM | POA: Diagnosis not present

## 2019-06-27 DIAGNOSIS — I2510 Atherosclerotic heart disease of native coronary artery without angina pectoris: Secondary | ICD-10-CM | POA: Diagnosis not present

## 2019-06-27 DIAGNOSIS — I6523 Occlusion and stenosis of bilateral carotid arteries: Secondary | ICD-10-CM | POA: Diagnosis not present

## 2019-06-27 DIAGNOSIS — I340 Nonrheumatic mitral (valve) insufficiency: Secondary | ICD-10-CM | POA: Diagnosis not present

## 2019-06-27 DIAGNOSIS — I255 Ischemic cardiomyopathy: Secondary | ICD-10-CM

## 2019-06-27 DIAGNOSIS — I714 Abdominal aortic aneurysm, without rupture, unspecified: Secondary | ICD-10-CM

## 2019-06-27 DIAGNOSIS — Z9861 Coronary angioplasty status: Secondary | ICD-10-CM

## 2019-06-27 DIAGNOSIS — N183 Chronic kidney disease, stage 3 unspecified: Secondary | ICD-10-CM | POA: Diagnosis not present

## 2019-06-27 MED ORDER — FUROSEMIDE 20 MG PO TABS: 60.0000 mg | ORAL_TABLET | Freq: Every day | ORAL | 3 refills | Status: DC

## 2019-06-27 MED ORDER — POTASSIUM CHLORIDE CRYS ER 20 MEQ PO TBCR: 20.0000 meq | EXTENDED_RELEASE_TABLET | Freq: Once | ORAL | 3 refills | Status: DC

## 2019-06-27 NOTE — Patient Instructions (Addendum)
Medication Instructions:   INCREASE LASIX TO 60 MG (3 TABLETS) DAILY  INCREASE POTASSIUM TO 20 MEQ DAILY  *If you need a refill on your cardiac medications before your next appointment, please call your pharmacy*  Lab Work:  You will need to have labs (blood work) drawn today and again in 1 weeks:  BMET If you have labs (blood work) drawn today and your tests are completely normal, you will receive your results only by: Marland Kitchen MyChart Message (if you have MyChart) OR . A paper copy in the mail If you have any lab test that is abnormal or we need to change your treatment, we will call you to review the results.  Testing/Procedures:  NONE ordered at this time of appointment   Follow-Up: At [facility], you and your health needs are our priority.  As part of our continuing mission to provide you with exceptional heart care, we have created designated Provider Care Teams.  These Care Teams include your primary Cardiologist (physician) and Advanced Practice Providers (APPs -  Physician Assistants and Nurse Practitioners) who all work together to provide you with the care you need, when you need it.  Your next appointment:   Follow up as scheduled  The format for your next appointment:   In Person  Provider:   Roby Lofts, PA-C  Other Instructions

## 2019-06-27 NOTE — Progress Notes (Signed)
Cardiology Office Note    Date:  06/29/2019   ID:  Logan White, Logan White June 27, 1941, MRN [id number]  PCP:  Logan White, No Pcp Per  Cardiologist: Dr. [name]  Chief Complaint  Logan White presents with  . Follow-up    seen for Dr. [name].     History of Present Illness:  Logan White is a 78 y.o. male with past medical history of permanent atrial fibrillation, CAD, prior V. fib arrest, and ischemic cardiomyopathy.  He had PCI of LAD October 2004.  He was admitted in June 2013 was V. fib arrest in the setting of severe hypokalemia with potassium 2.2.  Cardiac catheterization in April 2014 showed patent stent in the LAD with 40% in-stent restenosis, apical LAD had 80% lesion, left circumflex artery was occluded, right coronary artery was small and nondominant.  He had attempted at CTO PCI of left circumflex however this was unsuccessful.  Echocardiogram in April 2014 showed EF down to 30 to 35%, mild LAE, moderately reduced RV function with mild MR.  He was seen by Dr. Lovena Le who felt ICD was not indicated at the time because Logan White did suffer NSTEMI but with positive enzymes.  He was discharged with what [place] in January 2017 showed fixed inferior and apical defect with no ischemia.  Logan White had lower extremity angiography in April 2017 that showed occluded right external iliac and the common femoral artery not amenable to PCI and 80% left common femoral artery stenosis, occluded left SFA also not amenable to PCI.  He was managed medically.  Echocardiogram in March 2018 showed EF 30 to 35%, moderate AI, severe MR, biatrial enlargement.  Carotid Doppler obtained in November 2019 showed moderate disease bilaterally, 21-month follow-up study was recommended.  AAA Doppler in July 2020 showed 3 cm dilatation, 2-year follow-up study was recommended  Logan White was last seen by Dr. [name] in October 2019 at which time he was doing well. More recently, Logan White was admitted to the hospital  on 06/23/2019 with lower extremity edema and shortness of breath.  He recently went to [place], he noticed his lower extremity edema worsening on his way back.  He was treated with IV Lasix but improvement.  Echocardiogram obtained during this admission showed EF 30 to 35%, severe MR, moderate AR.  Logan White was seen by Dr. Harrington Challenger who recommended addition of low-dose Imdur.  Hydralazine and losartan were discontinued during this hospitalization.  Creatinine was trending up prior to discharge.  Dr. Harrington Challenger recommended discharge on 60 mg every other day of Lasix.  Logan White presents today for cardiology office visit.  His discharge weight was 166 pounds.  Today's weight is 170.2 pounds.  On physical exam, he has at least 1-2+ pitting edema in bilateral lower extremity.  Looking back, his previous diuretic dosage was 40 mg daily prior to the hospitalization, however since hospitalization his diuretic has been changed to 60 mg every other day which is a lower dose when compared to the previous diuretic dosage.  I recommend he increase Lasix to 60 mg daily and increase potassium chloride to 20 mEq daily.  I will obtain a basic metabolic panel today and also in 1 week to follow his renal function and electrolyte closely.  There remains lingering question regarding what to do about his LV dysfunction and severe MR.  I think he may be a candidate for mitral clip, this will need to be discussed with Dr. [name].  Given his EF of 30 to 35%, he likely is  also a candidate for ICD.  For the time being, I will focus on diuresing him.  He will return in 1.5 to 2 weeks for follow-up on day Dr. [name] is also in the office.   Past Medical History:  Diagnosis Date  . AAA (abdominal aortic aneurysm) ([place])    a. 08/2015 Abd U/S: 2.7 x 2.9 x 3.2 cm infrarenal AAA.  . Bradycardia    a. Requiring discontinuation of use of BB/CCB  . Cardiac arrest - ventricular fibrillation    a. 03/2012 in setting of hypokalemia (prolonged hosp  with VDRF, tracheobronchitis, ARF, shock liver, PAF, AMS felt secondary to post-anoxic encephalopathy/shock).  . Carotid artery stenosis    a. 01/2011 - 40-59% bilateral stenosis;  b. 06/2015 Carotid U/S: 40-59% bilat ICA stenosis->f/u 6 mos.  . CKD (chronic kidney disease), stage III   . Coronary artery disease    a. s/p aborted ant STEMI tx with Cypher DES to LAD 10/04 (residual at cath: D1 50%, CFX 40% and multiple dist 70%, EF 55%);   b. myoview 3/10: Ef 47%, infero-apical isch, LOW RISK - med Tx recommended;  c. 12/2012 VF Arrest/Cath: LAD 40isr, 80 apical, LCX 100 (failed PTCA), RCA nondom.  . Diabetes mellitus   . Diverticulosis 2001  . Elevated LFTs    Shock liver 03/2012  . H/O: CVA (cardiovascular accident)   . Hematemesis    a. 12/2010 felt 2/2 Mallory Weiss tear - pt could not afford colonscopy/EGD at that time so Coumadin was deferred. Coumadin initiated 03/2012 without any evidence for bleeding.  Marland Kitchen History of Ischemic Cardiomyopathy    a. EF 50-55% by echo 03/09/12 (was 30% by echo 02/24/12); b. 03/2013 Echo: EF 55-60%, mild MR, sev dil LA, mod dil RA, PASP 66mmHg.  Marland Kitchen Hypertensive heart disease    a. echo 4/12: EF 50%, asymmetric septal hypertrophy, no SAM or LVOT gradient, LAE, PASP 35  . Inguinal hernia   . 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 Mitral regurgitation    a. Mild by echo 03/2012 and 03/2013.  . Mixed Hyperlipidemia   . Peripheral vascular disease ([place])    a. h/o LE angioplasty;  b. 08/2015 Duplex: >50% RCIA, 100% REIA, >50% LEIA, elev vel in SMA, patent IVC.  Marland Kitchen Persistent atrial fibrillation ([place])    a. Dx 12/2010-->coumadin (CHA2DS2VASc = 7).  . QT prolongation     Past Surgical History:  Procedure Laterality Date  . HERNIA REPAIR    . LEFT HEART CATH N/A 12/30/2012   Procedure: LEFT HEART CATH;  Surgeon: [name], MD;  Location: [facility] C[facility] LAB;  Service: Cardiovascular;  Laterality: N/A;  . ORIF TIBIA & FIBULA FRACTURES   01/11/07   OPEN TX OF UNICONDYLAR PLATEAU FRACTURE, IRRIGATION/DEBRIDEMENT OF OPEN FRACTURE INCLUDING BONE, REMOVAL OF EXTERNAL FIXATOR UNDER ANESTHESIA PER DR. MICHAEL HANDY  . PERIPHERAL VASCULAR CATHETERIZATION N/A 12/21/2015   Procedure: Lower Extremity Angiography;  Surgeon: [name], MD;  Location: [facility] CV LAB;  Service: Cardiovascular;  Laterality: N/A;  . POPLITEAL ARTERY ANGIOPLASTY  01/07/07   s/p LEFT POPLITEAL ARTERY EXPLORATION AND VEIN PATCH ANGIOPLASTY, PER DR. EARLY, SECONDARY TO ISCHEMIC LEFT FOOT RELATED TO LEFT POPLITEAL ARTERY INJURY  . SKIN GRAFT      Current Medications: Outpatient Medications Prior to Visit  Medication Sig Dispense Refill  . apixaban (ELIQUIS) 5 MG TABS tablet Take 1 tablet (5 mg total) by mouth 2 (two) times daily. 60 tablet 6  . atorvastatin (LIPITOR)  80 MG tablet TAKE 1 TABLET BY MOUTH EVERY DAY 90 tablet 2  . isosorbide mononitrate (IMDUR) 30 MG 24 hr tablet Take 1 tablet (30 mg total) by mouth daily. 30 tablet 0  . metoprolol succinate (TOPROL-XL) 100 MG 24 hr tablet TAKE 1 TABLET (100 MG TOTAL) BY MOUTH DAILY. *NEEDS OFFICE VISIT FOR FURTHER REFILLS* 30 tablet 0  . nicotine (NICODERM CQ - DOSED IN MG/24 HOURS) 21 mg/24hr patch Place 1 patch (21 mg total) onto the skin daily. 28 patch 0  . nitroGLYCERIN (NITROSTAT) 0.4 MG SL tablet Place 1 tablet (0.4 mg total) under the tongue every 5 (five) minutes as needed for chest pain (Up to 3 doses). 25 tablet 3  . furosemide (LASIX) 20 MG tablet Take 3 tablets (60 mg total) by mouth every other day. 20 tablet 0  . potassium chloride SA (KLOR-CON M20) 20 MEQ tablet Take 1 tablet (20 mEq total) by mouth every other day. Take on days when taking lasix 60 mg. 180 tablet 3   No facility-administered medications prior to visit.      Allergies:   Logan White has no known allergies.   Social History   Socioeconomic History  . Marital status: Married    Spouse name: Not on file  . Number of children: 4   . Years of education: Not on file  . Highest education level: Not on file  Occupational History  . Occupation: FOREMAN FOR A [employer]    Employer: [employer]  . Financial resource strain: Not on file  . Food insecurity    Worry: Not on file    Inability: Not on file  . Transportation needs    Medical: Not on file    Non-medical: Not on file  Tobacco Use  . Smoking status: Current Some Day Smoker    Packs/day: 0.50    Years: 30.00    Pack years: 15.00    Types: Cigarettes    Last attempt to quit: 03/12/2012    Years since quitting: 7.3  . Smokeless tobacco: Never Used  Substance and Sexual Activity  . Alcohol use: No    Alcohol/week: 0.0 standard drinks  . Drug use: No    Frequency: 2.0 times per week  . Sexual activity: Not on file  Lifestyle  . Physical activity    Days per week: Not on file    Minutes per session: Not on file  . Stress: Not on file  Relationships  . Social Herbalist on phone: Not on file    Gets together: Not on file    Attends religious service: Not on file    Active member of club or organization: Not on file    Attends meetings of clubs or organizations: Not on file    Relationship status: Not on file  Other Topics Concern  . 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 YES. 1/2 -1 PPD OF CIGARETTES    NO ETOH     Family History:  The Logan White's family history includes Brain cancer in his mother; Heart disease in his father; Heart failure in his father; Leukemia in his mother; Sickle cell anemia in his mother and another family member.   ROS:   Please see the history of present illness.    ROS All other systems reviewed and  are negative.   PHYSICAL EXAM:   VS:  BP (!) 118/50   Pulse 82   Temp 98.7 F (37.1 C)   Ht 6\' 3"  (1.905 m)   Wt 170 lb 3.2 oz (77.2 kg)   SpO2 92%   BMI 21.27 kg/m    GEN: Well  nourished, well developed, in no acute distress  HEENT: normal  Neck: no JVD, carotid bruits, or masses Cardiac: RRR; no murmurs, rubs, or gallops,no edema  Respiratory:  clear to auscultation bilaterally, normal work of breathing GI: soft, nontender, nondistended, + BS MS: no deformity or atrophy  Skin: warm and dry, no rash Neuro:  Alert and Oriented x 3, Strength and sensation are intact Psych: euthymic mood, full affect  Wt Readings from Last 3 Encounters:  06/27/19 170 lb 3.2 oz (77.2 kg)  06/24/19 166 lb 9.6 oz (75.6 kg)  11/23/15 170 lb (77.1 kg)      Studies/Labs Reviewed:   EKG:  EKG is ordered today.  The ekg ordered today demonstrates atrial fibrillation with lateral TWI  Recent Labs: 06/22/2019: B Natriuretic Peptide 1,512.4 06/23/2019: ALT 38; Magnesium 2.0 06/24/2019: Hemoglobin 12.9; Platelets 298 06/27/2019: BUN 29; Creatinine, Ser 1.47; Potassium 4.6; Sodium 145   Lipid Panel    Component Value Date/Time   CHOL 127 06/25/2018 1107   TRIG 117 06/25/2018 1107   HDL 30 (L) 06/25/2018 1107   CHOLHDL 4.2 06/25/2018 1107   CHOLHDL 3.8 10/24/2014 0943   VLDL 22 10/24/2014 0943   LDLCALC 74 06/25/2018 1107   LDLDIRECT 76.3 08/13/2010 0931    Additional studies/ records that were reviewed today include:   Echo 06/23/2019 IMPRESSIONS    1. Left ventricular ejection fraction, by visual estimation, is 30 to 35%. The left ventricle has moderately decreased function. Moderately increased left ventricular size. There is no left ventricular hypertrophy.  2. Multiple segmental abnormalities exist. See findings.  3. Left ventricular diastolic function could not be evaluated pattern of LV diastolic filling.  4. Global right ventricle has moderately reduced systolic function.The right ventricular size is mildly enlarged. No increase in right ventricular wall thickness.  5. Left atrial size was severely dilated.  6. Right atrial size was moderately dilated.  7.  Moderate mitral annular calcification.  8. The mitral valve is normal in structure. Severe mitral valve regurgitation.  9. The tricuspid valve is normal in structure. Tricuspid valve regurgitation is mild. 10. The aortic valve is normal in structure. Aortic valve regurgitation is mild by color flow Doppler. Mild aortic valve sclerosis without stenosis. 11. The pulmonic valve was normal in structure. Pulmonic valve regurgitation is mild by color flow Doppler. 12. Moderately elevated pulmonary artery systolic pressure. 13. The inferior vena cava is dilated in size with <50% respiratory variability, suggesting right atrial pressure of 15 mmHg.   ASSESSMENT:    1. Acute on chronic systolic heart failure ([place])   2. Permanent atrial fibrillation ([place])   3. CAD S/P PCI   4. Ischemic cardiomyopathy   5. Severe mitral regurgitation   6. Bilateral carotid artery stenosis   7. AAA (abdominal aortic aneurysm) without rupture ([place])   8. Stage 3 chronic kidney disease, unspecified whether stage 3a or 3b CKD      PLAN:  In order of problems listed above:  1. Acute on chronic systolic heart failure: He was discharged on 60 mg every other day of Lasix.  I will increase this to 60 mg daily.  He has also been instructed to take  20 mEq daily of potassium  2. CKD stage III: Obtain basic metabolic panel  3. Permanent atrial fibrillation: On Eliquis and metoprolol  4. CAD: Denies any recent chest pain.  5. Ischemic cardiomyopathy: Continue Toprol-XL.  His hydralazine and losartan has been discontinued during the recent hospitalization.  He was started on low-dose Imdur.  I plan to restart hydralazine first when his blood pressure able to tolerate then consider restart losartan in the future.  Recent echo continues to show EF low at 30 to 35%.  He qualify for ICD implantation.  I will focus on diuresis for now and discuss ICD implantation on the next follow-up.  6. Severe mitral regurgitation: Seen on  recent echocardiogram.  He may be a candidate for mitral clip.  Will need to discuss with Dr. [name] on the next follow-up  7. Bilateral carotid artery disease: Due for repeat image in December 2020  8. AAA: Due for repeat imaging in 2 years    Medication Adjustments/Labs and Tests Ordered: Current medicines are reviewed at length with the Logan White today.  Concerns regarding medicines are outlined above.  Medication changes, Labs and Tests ordered today are listed in the Logan White Instructions below. Logan White Instructions  Medication Instructions:   INCREASE LASIX TO 60 MG (3 TABLETS) DAILY  INCREASE POTASSIUM TO 20 MEQ DAILY  *If you need a refill on your cardiac medications before your next appointment, please call your pharmacy*  Lab Work:  You will need to have labs (blood work) drawn today and again in 1 weeks:  BMET If you have labs (blood work) drawn today and your tests are completely normal, you will receive your results only by: Marland Kitchen MyChart Message (if you have MyChart) OR . A paper copy in the mail If you have any lab test that is abnormal or we need to change your treatment, we will call you to review the results.  Testing/Procedures:  NONE ordered at this time of appointment   Follow-Up: At [facility], you and your health needs are our priority.  As part of our continuing mission to provide you with exceptional heart care, we have created designated Provider Care Teams.  These Care Teams include your primary Cardiologist (physician) and Advanced Practice Providers (APPs -  Physician Assistants and Nurse Practitioners) who all work together to provide you with the care you need, when you need it.  Your next appointment:   Follow up as scheduled  The format for your next appointment:   In Person  Provider:   Roby Lofts, PA-C  Other Instructions      Signed, Almyra Deforest, [place]  06/29/2019 11:40 PM    [address],  [place], [place]  [address] Phone: [phone]; Fax: [phone]

## 2019-06-28 LAB — BASIC METABOLIC PANEL
BUN/Creatinine Ratio: 20 (ref 10–24)
BUN: 29 mg/dL — ABNORMAL HIGH (ref 8–27)
CO2: 24 mmol/L (ref 20–29)
Calcium: 9.2 mg/dL (ref 8.6–10.2)
Chloride: 106 mmol/L (ref 96–106)
Creatinine, Ser: 1.47 mg/dL — ABNORMAL HIGH (ref 0.76–1.27)
GFR calc Af Amer: 53 mL/min/{1.73_m2} — ABNORMAL LOW (ref >59)
GFR calc non Af Amer: 46 mL/min/{1.73_m2} — ABNORMAL LOW (ref >59)
Glucose: 104 mg/dL — ABNORMAL HIGH (ref 65–99)
Potassium: 4.6 mmol/L (ref 3.5–5.2)
Sodium: 145 mmol/L — ABNORMAL HIGH (ref 134–144)

## 2019-06-29 ENCOUNTER — Encounter: Payer: Self-pay | Admitting: Physician Assistant

## 2019-07-03 ENCOUNTER — Telehealth: Payer: Self-pay

## 2019-07-03 ENCOUNTER — Other Ambulatory Visit: Payer: Self-pay

## 2019-07-03 DIAGNOSIS — I5023 Acute on chronic systolic (congestive) heart failure: Secondary | ICD-10-CM | POA: Diagnosis not present

## 2019-07-03 LAB — BASIC METABOLIC PANEL
BUN/Creatinine Ratio: 22 (ref 10–24)
BUN: 30 mg/dL — ABNORMAL HIGH (ref 8–27)
CO2: 22 mmol/L (ref 20–29)
Calcium: 9.1 mg/dL (ref 8.6–10.2)
Chloride: 103 mmol/L (ref 96–106)
Creatinine, Ser: 1.37 mg/dL — ABNORMAL HIGH (ref 0.76–1.27)
GFR calc Af Amer: 58 mL/min/{1.73_m2} — ABNORMAL LOW (ref >59)
GFR calc non Af Amer: 50 mL/min/{1.73_m2} — ABNORMAL LOW (ref >59)
Glucose: 110 mg/dL — ABNORMAL HIGH (ref 65–99)
Potassium: 4.7 mmol/L (ref 3.5–5.2)
Sodium: 138 mmol/L (ref 134–144)

## 2019-07-03 NOTE — Telephone Encounter (Addendum)
Left a detailed voice message for the patient with the comments from Almyra Deforest, PA-C and to give our office a call if he has any questions   ----- Message from Almyra Deforest, [place] sent at 06/28/2019  8:53 AM EDT ----- Kidney function improving,  treatment plan with regarding to diuretic adjustment from yesterday is unchanged.

## 2019-07-04 NOTE — Progress Notes (Signed)
The patient has been notified of the result and verbalized understanding.  All questions (if any) were answered. Jacqulynn Cadet, CMA 07/04/2019 12:00 PM

## 2019-07-06 ENCOUNTER — Other Ambulatory Visit: Payer: Self-pay | Admitting: Cardiology

## 2019-07-10 ENCOUNTER — Encounter: Payer: Self-pay | Admitting: Medical

## 2019-07-10 ENCOUNTER — Ambulatory Visit (INDEPENDENT_AMBULATORY_CARE_PROVIDER_SITE_OTHER): Payer: Medicare HMO | Admitting: Medical

## 2019-07-10 ENCOUNTER — Other Ambulatory Visit: Payer: Self-pay

## 2019-07-10 VITALS — BP 102/56 | HR 96 | Temp 97.0°F | Ht 75.0 in | Wt 169.2 lb

## 2019-07-10 DIAGNOSIS — I255 Ischemic cardiomyopathy: Secondary | ICD-10-CM | POA: Diagnosis not present

## 2019-07-10 DIAGNOSIS — I4821 Permanent atrial fibrillation: Secondary | ICD-10-CM

## 2019-07-10 DIAGNOSIS — N183 Chronic kidney disease, stage 3 unspecified: Secondary | ICD-10-CM

## 2019-07-10 DIAGNOSIS — I714 Abdominal aortic aneurysm, without rupture, unspecified: Secondary | ICD-10-CM

## 2019-07-10 DIAGNOSIS — Z9861 Coronary angioplasty status: Secondary | ICD-10-CM

## 2019-07-10 DIAGNOSIS — I5023 Acute on chronic systolic (congestive) heart failure: Secondary | ICD-10-CM

## 2019-07-10 DIAGNOSIS — E782 Mixed hyperlipidemia: Secondary | ICD-10-CM

## 2019-07-10 DIAGNOSIS — I10 Essential (primary) hypertension: Secondary | ICD-10-CM | POA: Diagnosis not present

## 2019-07-10 DIAGNOSIS — I2510 Atherosclerotic heart disease of native coronary artery without angina pectoris: Secondary | ICD-10-CM

## 2019-07-10 DIAGNOSIS — I340 Nonrheumatic mitral (valve) insufficiency: Secondary | ICD-10-CM

## 2019-07-10 DIAGNOSIS — I6523 Occlusion and stenosis of bilateral carotid arteries: Secondary | ICD-10-CM | POA: Diagnosis not present

## 2019-07-10 MED ORDER — POTASSIUM CHLORIDE CRYS ER 20 MEQ PO TBCR: 20.0000 meq | EXTENDED_RELEASE_TABLET | Freq: Two times a day (BID) | ORAL | 3 refills | Status: DC

## 2019-07-10 MED ORDER — FUROSEMIDE 20 MG PO TABS: 60.0000 mg | ORAL_TABLET | Freq: Two times a day (BID) | ORAL | 3 refills | Status: DC

## 2019-07-10 MED ORDER — ALBUTEROL SULFATE HFA 108 (90 BASE) MCG/ACT IN AERS: 2.0000 | INHALATION_SPRAY | Freq: Four times a day (QID) | RESPIRATORY_TRACT | 0 refills | Status: DC | PRN

## 2019-07-10 NOTE — Patient Instructions (Signed)
Medication Instructions:   INCREASE LASIX TO 60 MG 2 TIMES A DAY  INCREASE POTASSIUM TO 20 MEQ 2 TIMES A DAY *If you need a refill on your cardiac medications before your next appointment, please call your pharmacy*  Lab Work:  NONE ordered at this time of appointment   If you have labs (blood work) drawn today and your tests are completely normal, you will receive your results only by: Marland Kitchen MyChart Message (if you have MyChart) OR . A paper copy in the mail If you have any lab test that is abnormal or we need to change your treatment, we will call you to review the results.  Testing/Procedures:  NONE ordered at this time of appointment   Follow-Up: At [facility], you and your health needs are our priority.  As part of our continuing mission to provide you with exceptional heart care, we have created designated Provider Care Teams.  These Care Teams include your primary Cardiologist (physician) and Advanced Practice Providers (APPs -  Physician Assistants and Nurse Practitioners) who all work together to provide you with the care you need, when you need it.  Your next appointment:   1 WEEK  The format for your next appointment:   In Person  Provider:   Almyra Deforest, PA-C   Other Instructions

## 2019-07-10 NOTE — Progress Notes (Signed)
Cardiology Office Note   Date:  07/10/2019   ID:  Logan, White [birth year], MRN [id number]  PCP:  Patient, No Pcp Per  Cardiologist:  Kirk Ruths, MD EP: None  Chief Complaint  Patient presents with  . Follow-up    CHF      History of Present Illness: Logan White is a 78 y.o. male with a complicated PMH of CAD s/p PCI/DES to LAD 2004 with unsuccessful CTO intervention to LCx in 2014, permanent atrial fibrillation on apixaban, VF arrest, ischemic cardiomyopathy, PAD (occluded right external iliac and common femoral artery, 80% left common femoral artery stenosis, and occluded left SFA not amenable to stenting), severe MR, moderate AI, carotid artery disease, AAA (3 cm), HTN, HLD, and CKD stage 3, who presents for follow-up of his CHF.  He was last evaluated by cardiology at an outpatient visit with Almyra Deforest, PA-C 06/27/2019, at which time he was felt to be volume overloaded after his diuretic regimen was decreased following a recent hospitalization. He was started on lasix 60m daily and recommended to return in 2 weeks. He was hospitalized 06/23/2019-06/24/2019 for CHF and had an echocardiogram 30-35%, moderately reduced RV systolic function, severe LAE, moderate RAE, severe MR, moderate AI, and moderately elevated PA pressures.   He presents today for follow-up of his CHF. Weight is only down to 169lbs today from 170lb last visit despite increased lasix over the past 2 weeks. He reports no significant change in his breathing from his last visit. Still with LE edema which is unchanged. He does report some improvement in his SOB with an albuterol inhaler. He quit smoking 2 months ago and reports his chronic cough is improving. He denies chest pain, palpitations, PND, orthopnea, dizziness, lightheadedness, or syncope.   1. Acute on chronic combined CHF/ischemic cardiomyopathy: still appears volume overloaded despite increase in lasix to 668mdaily. EF back down to 30-35%  on echo 06/2019, previously up to 35-40% 05/2018 and ICD was deferred. BP is still on the soft side limiting ability to add ACEi/ARB/ARNI  - Will increase lasix to 6019mID until follow-up with HaoIsaac Laud/08/2019 - if no improvement, could consider transition to torsemide - Will increase potassium to 20 mEq BID while on BID lasix dosing - Continue metoprolol succinate and imdur.  - Encouraged low sodium diet and daily weights - We discussed benefits of ICD with EF 30-35% - consider referral to EP once volume status improves  2. Severe mitral regurgitation: Likely contributing to #1. We discussed a TEE to further evaluate his MR. This would be the next step to evaluate for possible mitraclip. Discussed with Dr. CreStanford Breedo agrees. Patient wishes to think about this procedure and plans to bring his wife to the visit next week for further discussion.  - Follow-up with patient next week for TEE consideration - once TEE completed, can refer to the structural heart team - Continue diuresis as above  3. Permanent atrial fibrillation: rate well controlled on metoprolol. No complaints of palpitations. No complaints of bleeding on eliquis - Continue metoprolol for rate control - Continue eliquis for stroke ppx  4. HTN: BP 102/56 today. Limits management of #1 - Continue metoprolol succinate and imdur  5. CAD s/p PCI/DES to LAD 2004 with unsuccessful CTO intervention to LCx in 2014: No chest pain complaints. Not on ASA given need for anticoagulation.  - Continue metoprolol, imdur, and statin   6. HLD: LDL 74 06/2018 - Continue statin  7. Carotid artery  stenosis: moderate on last duplex 07/2018 - Plan for repeat duplex 08/2019 - already scheduled - Continue statin  8. AAA: 3cm on last duplex 03/2019 - Plan for repeat duplex in 2022  9. CKD stage 3: Cr. 1.37 07/03/2019 - Plan to repeat BMET at next office visit 07/18/2019   Past Medical History:  Diagnosis Date  . AAA (abdominal aortic aneurysm)  ([place])    a. 08/2015 Abd U/S: 2.7 x 2.9 x 3.2 cm infrarenal AAA.  . Bradycardia    a. Requiring discontinuation of use of BB/CCB  . Cardiac arrest - ventricular fibrillation    a. 03/2012 in setting of hypokalemia (prolonged hosp with VDRF, tracheobronchitis, ARF, shock liver, PAF, AMS felt secondary to post-anoxic encephalopathy/shock).  . Carotid artery stenosis    a. 01/2011 - 40-59% bilateral stenosis;  b. 06/2015 Carotid U/S: 40-59% bilat ICA stenosis->f/u 6 mos.  . CKD (chronic kidney disease), stage III   . Coronary artery disease    a. s/p aborted ant STEMI tx with Cypher DES to LAD 10/04 (residual at cath: D1 50%, CFX 40% and multiple dist 70%, EF 55%);   b. myoview 3/10: Ef 47%, infero-apical isch, LOW RISK - med Tx recommended;  c. 12/2012 VF Arrest/Cath: LAD 40isr, 80 apical, LCX 100 (failed PTCA), RCA nondom.  . Diabetes mellitus   . Diverticulosis 2001  . Elevated LFTs    Shock liver 03/2012  . H/O: CVA (cardiovascular accident)   . Hematemesis    a. 12/2010 felt 2/2 Mallory Weiss tear - pt could not afford colonscopy/EGD at that time so Coumadin was deferred. Coumadin initiated 03/2012 without any evidence for bleeding.  Marland Kitchen History of Ischemic Cardiomyopathy    a. EF 50-55% by echo 03/09/12 (was 30% by echo 02/24/12); b. 03/2013 Echo: EF 55-60%, mild MR, sev dil LA, mod dil RA, PASP 84mHg.  .Marland KitchenHypertensive heart disease    a. echo 4/12: EF 50%, asymmetric septal hypertrophy, no SAM or LVOT gradient, LAE, PASP 35  . Inguinal hernia   . 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 Mitral regurgitation    a. Mild by echo 03/2012 and 03/2013.  . Mixed Hyperlipidemia   . Peripheral vascular disease ([place]    a. h/o LE angioplasty;  b. 08/2015 Duplex: >50% RCIA, 100% REIA, >50% LEIA, elev vel in SMA, patent IVC.  .Marland KitchenPersistent atrial fibrillation ([place]    a. Dx 12/2010-->coumadin (CHA2DS2VASc = 7).  . QT prolongation     Past Surgical History:  Procedure  Laterality Date  . HERNIA REPAIR    . LEFT HEART CATH N/A 12/30/2012   Procedure: LEFT HEART CATH;  Surgeon: DLeonie Man MD;  [facility];  Service: Cardiovascular;  Laterality: N/A;  . ORIF TIBIA & FIBULA FRACTURES  01/11/07   OPEN TX OF UNICONDYLAR PLATEAU FRACTURE, IRRIGATION/DEBRIDEMENT OF OPEN FRACTURE INCLUDING BONE, REMOVAL OF EXTERNAL FIXATOR UNDER ANESTHESIA PER DR. MICHAEL HANDY  . PERIPHERAL VASCULAR CATHETERIZATION N/A 12/21/2015   Procedure: Lower Extremity Angiography;  Surgeon: JLorretta Harp MD;  [facility];  Service: Cardiovascular;  Laterality: N/A;  . POPLITEAL ARTERY ANGIOPLASTY  01/07/07   s/p LEFT POPLITEAL ARTERY EXPLORATION AND VEIN PATCH ANGIOPLASTY, PER DR. EARLY, SECONDARY TO ISCHEMIC LEFT FOOT RELATED TO LEFT POPLITEAL ARTERY INJURY  . SKIN GRAFT       Current Outpatient Medications  Medication Sig Dispense Refill  . apixaban (ELIQUIS) 5 MG TABS tablet Take 1 tablet (5 mg total) by  mouth 2 (two) times daily. 60 tablet 6  . atorvastatin (LIPITOR) 80 MG tablet TAKE 1 TABLET BY MOUTH EVERY DAY 90 tablet 2  . furosemide (LASIX) 20 MG tablet Take 3 tablets (60 mg total) by mouth 2 (two) times daily. 180 tablet 3  . isosorbide mononitrate (IMDUR) 30 MG 24 hr tablet Take 1 tablet (30 mg total) by mouth daily. 30 tablet 0  . metoprolol succinate (TOPROL-XL) 100 MG 24 hr tablet Take 1 tablet (100 mg total) by mouth daily. Please keep upcoming appt for further refills. Thank you 30 tablet 0  . nicotine (NICODERM CQ - DOSED IN MG/24 HOURS) 21 mg/24hr patch Place 1 patch (21 mg total) onto the skin daily. 28 patch 0  . nitroGLYCERIN (NITROSTAT) 0.4 MG SL tablet Place 1 tablet (0.4 mg total) under the tongue every 5 (five) minutes as needed for chest pain (Up to 3 doses). 25 tablet 3  . albuterol (VENTOLIN HFA) 108 (90 Base) MCG/ACT inhaler Inhale 2 puffs into the lungs every 6 (six) hours as needed for wheezing or shortness of breath. 90 g 0  .  potassium chloride SA (KLOR-CON M20) 20 MEQ tablet Take 1 tablet (20 mEq total) by mouth 2 (two) times daily for 30 doses. Take on days when taking lasix 60 mg. 30 tablet 3   No current facility-administered medications for this visit.     Allergies:   Patient has no known allergies.    Social History:  The patient  reports that he has been smoking cigarettes. He has a 15.00 pack-year smoking history. He has never used smokeless tobacco. He reports that he does not drink alcohol or use drugs.   Family History:  The patient's family history includes Brain cancer in his mother; Heart disease in his father; Heart failure in his father; Leukemia in his mother; Sickle cell anemia in his mother and another family member.    ROS:  Please see the history of present illness.   Otherwise, review of systems are positive for none.   All other systems are reviewed and negative.    PHYSICAL EXAM: VS:  BP (!) 102/56   Pulse 96   Temp (!) 97 F (36.1 C)   Ht _0  (1.905 m)   Wt 169 lb 3.2 oz (76.7 kg)   SpO2 99%   BMI 21.15 kg/m  , BMI Body mass index is 21.15 kg/m. GEN: Thin elderly gentleman sitting on the exam table in no acute distress; mild conversational SOB HEENT: sclera anicteric Neck: no JVD, carotid bruits, or masses Cardiac: IRIR; no murmurs, rubs, or gallops, 2+ LE edema  Respiratory:  clear to auscultation bilaterally, normal work of breathing GI: soft, nontender, nondistended, + BS MS: no deformity or atrophy Skin: warm and dry, no rash Neuro:  Strength and sensation are intact Psych: euthymic mood, full affect   EKG:  EKG is not ordered today.   Recent Labs: 06/22/2019: B Natriuretic Peptide 1,512.4 06/23/2019: ALT 38; Magnesium 2.0 06/24/2019: Hemoglobin 12.9; Platelets 298 07/03/2019: BUN 30; Creatinine, Ser 1.37; Potassium 4.7; Sodium 138    Lipid Panel    Component Value Date/Time   CHOL 127 06/25/2018 1107   TRIG 117 06/25/2018 1107   HDL 30 (L) 06/25/2018  1107   CHOLHDL 4.2 06/25/2018 1107   CHOLHDL 3.8 10/24/2014 0943   VLDL 22 10/24/2014 0943   LDLCALC 74 06/25/2018 1107   LDLDIRECT 76.3 08/13/2010 0931      Wt Readings from Last 3 Encounters:  07/10/19 169 lb 3.2 oz (76.7 kg)  06/27/19 170 lb 3.2 oz (77.2 kg)  06/24/19 166 lb 9.6 oz (75.6 kg)      Other studies Reviewed: Additional studies/ records that were reviewed today include:  ECHO: 06/23/2019 1. Left ventricular ejection fraction, by visual estimation, is 30 to 35%. The left ventricle has moderately decreased function. Moderately increased left ventricular size. There is no left ventricular hypertrophy. 2. Multiple segmental abnormalities exist. See findings. 3. Left ventricular diastolic function could not be evaluated pattern of LV diastolic filling. 4. Global right ventricle has moderately reduced systolic function.The right ventricular size is mildly enlarged. No increase in right ventricular wall thickness. 5. Left atrial size was severely dilated. 6. Right atrial size was moderately dilated. 7. Moderate mitral annular calcification. 8. The mitral valve is normal in structure. Severe mitral valve regurgitation. 9. The tricuspid valve is normal in structure. Tricuspid valve regurgitation is mild. 10. The aortic valve is normal in structure. Aortic valve regurgitation is mild by color flow Doppler. Mild aortic valve sclerosis without stenosis. 11. The pulmonic valve was normal in structure. Pulmonic valve regurgitation is mild by color flow Doppler. 12. Moderately elevated pulmonary artery systolic pressure. 13. The inferior vena cava is dilated in size with <50% respiratory variability, suggesting right atrial pressure of 15 mmHg. Left Ventricle: Left ventricular ejection fraction, by visual estimation, is 30 to 35%. The left ventricle has moderately decreased function. There is no left ventricular hypertrophy. Moderately increased left ventricular size. The  left ventricular  diastology could not be evaluatedsecondary to atrial fibrillation. Spectral Doppler shows Left ventricular diastolic function could not be evaluated pattern of LV diastolic filling. LV Wall Scoring: The basal and mid inferolateral wall is akinetic. The entire anterior wall, basal and mid anterolateral wall, basal and mid anterior septum, apical lateral segment, and apex are hypokinetic. All remaining scored segments are normal.  MYOVIEW: 10/06/2015  No T wave inversion was noted during stress.  There was no ST segment deviation noted during stress.  Defect 1: There is a small defect of moderate severity.  Findings consistent with prior myocardial infarction. Small size, moderate intensity fixed apical and inferior wall defect, which may represent scar or apical thinning. The LV is markedly dilated at 200 ml (stress) and 158 ml (rest) - although the study was not gated, this suggests the possibility of dilated cardiomyopathy. No reversible ischemia is noted. This is a low risk stress test from an ischemia standpoint.   CATH: 12/31/2012 Coronary Anatomy:  Left Main: Large-caliber vessel that bifurcates into the LAD and circumflex, mildly calcified but no significant lesions.  OFB:PZWCH-ENIDPOE vessel gives rise to proximal moderate sized septal perforator and first diagonal prior to the previously placed Cypher DES 3.5 mm x 18 mmpostdilated to 4.0 mm. The distal portion of the stent has a roughly 40% in-stent restenosis that extends beyond the stent. Distal flow the LAD is TIMI 1-2 pulsatile flow showing 2 smaller distal diagonal. The vessel wraps the apex and there is an apical eccentric roughly 80% lesion. The inferoapical vessel courses about a third of the way down the posterior septum   Left Circumflex:large-caliber dominant vessel that gives rise to a very proximal OM1 then 2 small branches before it is occluded in the AV groove.   OM1:Large-caliber vessel  that reaches almost all again the apex bifurcating distally. Minimal luminal irregularities.  On reviewing the angiography from 2004, there was one small OM 2 followed by moderate caliber LPL and L. PDA.  There is diffuse disease in these vessels a roughly 70%. After PTCA at the occlusion site and downstream slightly only the small OM 2 was visualized and no flow down the distal vessel was able to restore.    RCA: Small caliber nondominant vessel with only 2 RV marginal branches.  Percutaneous Coronary Intervention:  Guide: 6 Fr XB 3.5 Guidewire: pro-water  Predilation Balloon: Emerge 2.5 mm x 12 mm;   6 Atm x 30 Sec, 10 Atm x 30 Sec -->minimal downstream flow noted however the wire did move freely into what appeared to be an OM branch.  As this area did appear to be somewhat calcified, a scoring balloon was chosen next. Predilation Balloon: Emerge 2.5 mm x 12 mm; Stent: AngioSculpt scoring balloon 2.0 mm x 10 mm;  ? 6 Atm x 30 Sec, 8 Atm x 45 Sec ? TIMI 1 flow was restored into what now is clearly only a small OM 2. ?  Pre--dilation Balloon#2 : Quantum 2.0 mm x 12 mm;  ? 8 Atm x 45 Sec, followed by 2 more proximal inflations at 8 Atm x 30 Sec   Post inflation angiography in multiple views, with and without guidewire in place revealed TIMI 2-3 flow down a trivial sized obtuse marginal. The wire was not successfully advanced into the mainstream circumflex. At this time, I determined that this is most likely a chronic total occlusion, and not the culprit for this acute event. The decision was made to abort the intervention As the patient was now hemodynamic stable in normal sinus rhythm with a relatively normal blood pressure and off dopamine. POST-OPERATIVE DIAGNOSIS:   As the circumflex occlusion was most likely chronic total occlusion and not acute, no obvious culprit lesion for tonight's event could be identified besides the distal/apical LAD lesion.   Relatively normal LVEDP and stable blood pressures and heart rhythm.  Prolonged cardiac arrest with CPR, starting blood gas with a pH of 6.9; likely poor overall prognosis  Most likely occluded Right Common Iliac Artery  PV CATH: 12/21/2015 Procedures Performed: 1. abdominal aortogram 2. Bilateral iliac angiogram 3. Left lower extremity runoff Angiographic Data:  1: Abdominal aortic rash fluoroscopically calcified and not atherosclerotic aneurysm 2: Right lower extremity-the right external iliac artery was occludedafter the takeoff of the hypogastric and filled by collaterals in the right common femoral artery level which was diffusely diseased. The right SFA appeared to be patent. 3: Left lower extremity-there was an 80% eccentric focal left common femoral artery stenosis without a pullback gradient. The left SFA was occluded at its origin and reconstituted in the adductor canal. Profunda femoris collaterals. There was an old nitinol self-expanding stent in the mid left SFA a which was occluded. There was one-vessel runoff peroneal.  IMPRESSION:occluded Right external iliac artery not percutaneously addressable. Occluded left SFA not percutaneously addressable.. Continued medical therapy will be recommended. A total of 65 mL of contrast was administered to the patient. The sheath was removed and pressure held. The patient will be discharged home today and will restart Coumadin.   ASSESSMENT AND PLAN:   1. Acute on chronic combined CHF/ischemic cardiomyopathy: still appears volume overloaded despite increase in lasix to 72m daily. EF back down to 30-35% on echo 06/2019, previously up to 35-40% 05/2018 and ICD was deferred. BP is still on the soft side limiting ability to add ACEi/ARB/ARNI  - Will increase lasix to 623mBID until follow-up with HaIsaac Laud1/08/2019 - if no improvement, could consider transition to torsemide - Will  increase potassium to 20 mEq  BID while on BID lasix dosing - Continue metoprolol succinate and imdur.  - Encouraged low sodium diet and daily weights - We discussed benefits of ICD with EF 30-35% - consider referral to EP once volume status improves - Could consider referral to Advanced HF clinic if ongoing difficulty with diuretics - ?low output CHF  2. Severe mitral regurgitation: Likely contributing to #1. We discussed a TEE to further evaluate his MR. This would be the next step to evaluate for possible mitraclip. Discussed with Dr. [name] who agrees. Patient wishes to think about this procedure and plans to bring his wife to the visit next week for further discussion.  - Follow-up with patient next week for TEE consideration - once TEE completed, can refer to the structural heart team - Continue diuresis as above  3. Permanent atrial fibrillation: rate well controlled on metoprolol. No complaints of palpitations. No complaints of bleeding on eliquis - Continue metoprolol for rate control - Continue eliquis for stroke ppx  4. HTN: BP 102/56 today. Limits management of #1 - Continue metoprolol succinate and imdur  5. CAD s/p PCI/DES to LAD 2004 with unsuccessful CTO intervention to LCx in 2014: No chest pain complaints. Not on ASA given need for anticoagulation.  - Continue metoprolol, imdur, and statin   6. HLD: LDL 74 06/2018 - Continue statin  7. Carotid artery stenosis: moderate on last duplex 07/2018 - Plan for repeat duplex 08/2019 - already scheduled - Continue statin  8. AAA: 3cm on last duplex 03/2019 - Plan for repeat duplex in 2022  9. CKD stage 3: Cr. 1.37 07/03/2019 - Plan to repeat BMET at next office visit 07/18/2019     Current medicines are reviewed at length with the patient today.  The patient does not have concerns regarding medicines.  The following changes have been made:  As above  Labs/ tests ordered today include:  No orders of the defined types were placed in this encounter.     Disposition:   FU with Almyra Deforest, PA-C 07/18/2019    Signed, Abigail Butts, PA-C  07/10/2019 5:16 PM

## 2019-07-18 ENCOUNTER — Other Ambulatory Visit: Payer: Self-pay

## 2019-07-18 ENCOUNTER — Ambulatory Visit (INDEPENDENT_AMBULATORY_CARE_PROVIDER_SITE_OTHER): Payer: Medicare HMO | Admitting: Physician Assistant

## 2019-07-18 ENCOUNTER — Encounter: Payer: Self-pay | Admitting: Physician Assistant

## 2019-07-18 VITALS — BP 110/60 | HR 92 | Ht 75.5 in | Wt 172.0 lb

## 2019-07-18 DIAGNOSIS — I714 Abdominal aortic aneurysm, without rupture, unspecified: Secondary | ICD-10-CM

## 2019-07-18 DIAGNOSIS — I5022 Chronic systolic (congestive) heart failure: Secondary | ICD-10-CM

## 2019-07-18 DIAGNOSIS — I6523 Occlusion and stenosis of bilateral carotid arteries: Secondary | ICD-10-CM

## 2019-07-18 DIAGNOSIS — Z01810 Encounter for preprocedural cardiovascular examination: Secondary | ICD-10-CM | POA: Diagnosis not present

## 2019-07-18 DIAGNOSIS — I4821 Permanent atrial fibrillation: Secondary | ICD-10-CM

## 2019-07-18 DIAGNOSIS — I2510 Atherosclerotic heart disease of native coronary artery without angina pectoris: Secondary | ICD-10-CM | POA: Diagnosis not present

## 2019-07-18 DIAGNOSIS — I739 Peripheral vascular disease, unspecified: Secondary | ICD-10-CM

## 2019-07-18 DIAGNOSIS — I255 Ischemic cardiomyopathy: Secondary | ICD-10-CM | POA: Diagnosis not present

## 2019-07-18 DIAGNOSIS — Z79899 Other long term (current) drug therapy: Secondary | ICD-10-CM | POA: Diagnosis not present

## 2019-07-18 DIAGNOSIS — I340 Nonrheumatic mitral (valve) insufficiency: Secondary | ICD-10-CM

## 2019-07-18 NOTE — Progress Notes (Signed)
Cardiology Office Note:    Date:  07/20/2019   ID:  Logan White 1941-01-17, MRN [id number]  PCP:  Patient, No Pcp Per  Cardiologist:  Kirk Ruths, MD  Electrophysiologist:  None   Referring MD: No ref. provider found   Chief Complaint  Patient presents with  . Follow-up    seen for Dr. [name].     History of Present Illness:    Logan White is a 78 y.o. male with a hx of permanent atrial fibrillation, CAD, prior V. fib arrest, and ischemic cardiomyopathy.  He had PCI of LAD October 2004.  He was admitted in June 2013 with V. fib arrest in the setting of severe hypokalemia with potassium 2.2.  Cardiac catheterization in April 2014 showed patent stent in the LAD with 40% in-stent restenosis, apical LAD had 80% lesion, left circumflex artery was occluded, right coronary artery was small and nondominant.  He had attempted at CTO PCI of left circumflex however this was unsuccessful.  Echocardiogram in April 2014 showed EF down to 30 to 35%, mild LAE, moderately reduced RV function with mild MR.  He was seen by Dr. Lovena Le who felt ICD was not indicated at the time because patient did suffer NSTEMI with positive enzymes.  He was discharged with what [place] in January 2017 showed fixed inferior and apical defect with no ischemia.  Patient had lower extremity angiography in April 2017 that showed occluded right external iliac and the common femoral artery not amenable to PCI and 80% left common femoral artery stenosis, occluded left SFA also not amenable to PCI.  He was managed medically.  Echocardiogram in March 2018 showed EF 30 to 35%, moderate AI, severe MR, biatrial enlargement.  Carotid Doppler obtained in November 2019 showed moderate disease bilaterally, 30-month follow-up study was recommended.  AAA Doppler in July 2020 showed 3 cm dilatation, 2-year follow-up study was recommended.   Patient was previously seen by Dr. [name] in October 2019 at which time he  was doing well.  He was admitted in October 2020 with lower extremity edema and shortness of breath.  He recently went to [place] and then noticed his lower extremity edema worsened on his way back.  He was treated with IV Lasix with improvement.  Echocardiogram obtained during this admission showed EF 30 to 35%, severe MR, moderate AR.  He was seen by Dr. Harrington Challenger who recommended addition of low-dose Imdur.  Hydralazine and losartan were discontinued during this hospitalization.  Creatinine was trending up prior to discharge.  Dr. Harrington Challenger recommended discharge on 60 mg every other day of Lasix.   When he returned, he has gained at least 3.5 pounds.  On physical exam, he had 1-2+ pitting edema.  I increased his Lasix to 60 mg daily and increase potassium to 20 meq daily.  He was seen by Roby Lofts, PA-C on 07/10/2019, he was still volume overloaded at the time, therefore Lasix dose was doubled.  He presents today for follow-up, he does not have any significant lower extremity edema at this stage.  I recommended to continue the current therapy.  I will obtain a basic metabolic panel to make sure he is not dehydrated.  I discussed with the patient both ICD implantation and work-up for his severe MR that was seen on the last echocardiogram.  Both the patient and his wife wish to think about ICD at this point, they are hesitant to proceed immediately therefore did not agree to proceed with  EP referral.  They are willing to proceed with additional work-up for his severe MR though.  I have arranged transesophageal echocardiogram.  Risk and benefit has been discussed with the patient and his wife, who were agreeable to proceed.   Past Medical History:  Diagnosis Date  . AAA (abdominal aortic aneurysm) ([place])    a. 08/2015 Abd U/S: 2.7 x 2.9 x 3.2 cm infrarenal AAA.  . Bradycardia    a. Requiring discontinuation of use of BB/CCB  . Cardiac arrest - ventricular fibrillation    a. 03/2012 in setting of hypokalemia  (prolonged hosp with VDRF, tracheobronchitis, ARF, shock liver, PAF, AMS felt secondary to post-anoxic encephalopathy/shock).  . Carotid artery stenosis    a. 01/2011 - 40-59% bilateral stenosis;  b. 06/2015 Carotid U/S: 40-59% bilat ICA stenosis->f/u 6 mos.  . CKD (chronic kidney disease), stage III   . Coronary artery disease    a. s/p aborted ant STEMI tx with [place] DES to LAD 10/04 (residual at cath: D1 50%, CFX 40% and multiple dist 70%, EF 55%);   b. myoview 3/10: Ef 47%, infero-apical isch, LOW RISK - med Tx recommended;  c. 12/2012 VF Arrest/Cath: LAD 40isr, 80 apical, LCX 100 (failed PTCA), RCA nondom.  . Diabetes mellitus   . Diverticulosis 2001  . Elevated LFTs    Shock liver 03/2012  . H/O: CVA (cardiovascular accident)   . Hematemesis    a. 12/2010 felt 2/2 Mallory Weiss tear - pt could not afford colonscopy/EGD at that time so Coumadin was deferred. Coumadin initiated 03/2012 without any evidence for bleeding.  Marland Kitchen History of Ischemic Cardiomyopathy    a. EF 50-55% by echo 03/09/12 (was 30% by echo 02/24/12); b. 03/2013 Echo: EF 55-60%, mild MR, sev dil LA, mod dil RA, PASP 63mmHg.  Marland Kitchen Hypertensive heart disease    a. echo 4/12: EF 50%, asymmetric septal hypertrophy, no SAM or LVOT gradient, LAE, PASP 35  . Inguinal hernia   . 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 Mitral regurgitation    a. Mild by echo 03/2012 and 03/2013.  . Mixed Hyperlipidemia   . Peripheral vascular disease ([place])    a. h/o LE angioplasty;  b. 08/2015 Duplex: >50% RCIA, 100% REIA, >50% LEIA, elev vel in SMA, patent IVC.  Marland Kitchen Persistent atrial fibrillation ([place])    a. Dx 12/2010-->coumadin (CHA2DS2VASc = 7).  . QT prolongation     Past Surgical History:  Procedure Laterality Date  . HERNIA REPAIR    . LEFT HEART CATH N/A 12/30/2012   Procedure: LEFT HEART CATH;  Surgeon: [name], MD;  Location: [facility] CATH LAB;  Service: Cardiovascular;  Laterality: N/A;  . ORIF TIBIA & FIBULA  FRACTURES  01/11/07   OPEN TX OF UNICONDYLAR PLATEAU FRACTURE, IRRIGATION/DEBRIDEMENT OF OPEN FRACTURE INCLUDING BONE, REMOVAL OF EXTERNAL FIXATOR UNDER ANESTHESIA PER DR. MICHAEL HANDY  . PERIPHERAL VASCULAR CATHETERIZATION N/A 12/21/2015   Procedure: Lower Extremity Angiography;  Surgeon: [name], MD;  Location: [facility] CV LAB;  Service: Cardiovascular;  Laterality: N/A;  . POPLITEAL ARTERY ANGIOPLASTY  01/07/07   s/p LEFT POPLITEAL ARTERY EXPLORATION AND VEIN PATCH ANGIOPLASTY, PER DR. EARLY, SECONDARY TO ISCHEMIC LEFT FOOT RELATED TO LEFT POPLITEAL ARTERY INJURY  . SKIN GRAFT      Current Medications: Current Meds  Medication Sig  . albuterol (VENTOLIN HFA) 108 (90 Base) MCG/ACT inhaler Inhale 2 puffs into the lungs every 6 (six) hours as needed for wheezing or shortness of breath.  Marland Kitchen  apixaban (ELIQUIS) 5 MG TABS tablet Take 1 tablet (5 mg total) by mouth 2 (two) times daily.  Marland Kitchen atorvastatin (LIPITOR) 80 MG tablet TAKE 1 TABLET BY MOUTH EVERY DAY  . furosemide (LASIX) 20 MG tablet Take 3 tablets (60 mg total) by mouth 2 (two) times daily.  . isosorbide mononitrate (IMDUR) 30 MG 24 hr tablet Take 1 tablet (30 mg total) by mouth daily.  . metoprolol succinate (TOPROL-XL) 100 MG 24 hr tablet Take 1 tablet (100 mg total) by mouth daily. Please keep upcoming appt for further refills. Thank you  . nitroGLYCERIN (NITROSTAT) 0.4 MG SL tablet Place 1 tablet (0.4 mg total) under the tongue every 5 (five) minutes as needed for chest pain (Up to 3 doses).  . potassium chloride SA (KLOR-CON M20) 20 MEQ tablet Take 1 tablet (20 mEq total) by mouth 2 (two) times daily for 30 doses. Take on days when taking lasix 60 mg.  . [DISCONTINUED] nicotine (NICODERM CQ - DOSED IN MG/24 HOURS) 21 mg/24hr patch Place 1 patch (21 mg total) onto the skin daily.     Allergies:   Patient has no known allergies.   Social History   Socioeconomic History  . Marital status: Married    Spouse name: Not on file   . Number of children: 4  . Years of education: Not on file  . Highest education level: Not on file  Occupational History  . Occupation: FOREMAN FOR A [employer]    Employer: [employer]  . Financial resource strain: Not on file  . Food insecurity    Worry: Not on file    Inability: Not on file  . Transportation needs    Medical: Not on file    Non-medical: Not on file  Tobacco Use  . Smoking status: Current Some Day Smoker    Packs/day: 0.50    Years: 30.00    Pack years: 15.00    Types: Cigarettes    Last attempt to quit: 03/12/2012    Years since quitting: 7.3  . Smokeless tobacco: Never Used  Substance and Sexual Activity  . Alcohol use: No    Alcohol/week: 0.0 standard drinks  . Drug use: No    Frequency: 2.0 times per week  . Sexual activity: Not on file  Lifestyle  . Physical activity    Days per week: Not on file    Minutes per session: Not on file  . Stress: Not on file  Relationships  . Social Herbalist on phone: Not on file    Gets together: Not on file    Attends religious service: Not on file    Active member of club or organization: Not on file    Attends meetings of clubs or organizations: Not on file    Relationship status: Not on file  Other Topics Concern  . 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 YES. 1/2 -1 PPD OF CIGARETTES    NO ETOH     Family History: The patient's family history includes Brain cancer in his mother; Heart disease in his father; Heart failure in his father; Leukemia in his mother; Sickle cell anemia in his mother and another family member.  ROS:   Please see the history of present illness.     All other systems reviewed and are  negative.  EKGs/Labs/Other Studies Reviewed:    The following studies were reviewed today:   EKG:  EKG is not ordered today.    Recent Labs: 06/22/2019: B  Natriuretic Peptide 1,512.4 06/23/2019: ALT 38; Magnesium 2.0 07/18/2019: BUN 28; Creatinine, Ser 1.79; Hemoglobin 12.7; Platelets 222; Potassium 3.8; Sodium 142  Recent Lipid Panel    Component Value Date/Time   CHOL 127 06/25/2018 1107   TRIG 117 06/25/2018 1107   HDL 30 (L) 06/25/2018 1107   CHOLHDL 4.2 06/25/2018 1107   CHOLHDL 3.8 10/24/2014 0943   VLDL 22 10/24/2014 0943   LDLCALC 74 06/25/2018 1107   LDLDIRECT 76.3 08/13/2010 0931    Physical Exam:    VS:  BP 110/60   Pulse 92   Ht 6' 3.5" (1.918 m)   Wt 172 lb (78 kg)   SpO2 94%   BMI 21.21 kg/m     Wt Readings from Last 3 Encounters:  07/18/19 172 lb (78 kg)  07/10/19 169 lb 3.2 oz (76.7 kg)  06/27/19 170 lb 3.2 oz (77.2 kg)     GEN:  Well nourished, well developed in no acute distress HEENT: Normal NECK: No JVD; No carotid bruits LYMPHATICS: No lymphadenopathy CARDIAC: Irregularly irregular, no murmurs, rubs, gallops RESPIRATORY:  Clear to auscultation without rales, wheezing or rhonchi  ABDOMEN: Soft, non-tender, non-distended MUSCULOSKELETAL:  No edema; No deformity  SKIN: Warm and dry NEUROLOGIC:  Alert and oriented x 3 PSYCHIATRIC:  Normal affect   ASSESSMENT:    1. Chronic systolic heart failure ([place])   2. Medication management   3. Pre-procedural cardiovascular examination   4. Coronary artery disease involving native coronary artery of native heart without angina pectoris   5. Permanent atrial fibrillation ([place])   6. Ischemic cardiomyopathy   7. PAD (peripheral artery disease) ([place])   8. Bilateral carotid artery stenosis   9. AAA (abdominal aortic aneurysm) without rupture ([place])   10. Severe mitral regurgitation    PLAN:    In order of problems listed above:  1. Chronic systolic heart failure: His Lasix dose was doubled during the last office visit.  I recommended basic metabolic panel today to make sure he is not dehydrated.  2. CAD: Denies any chest pain.  On Lipitor.  Not on aspirin  given the need for Eliquis.  3. Permanent atrial fibrillation: Rate controlled on metoprolol.  Anticoagulated with Eliquis  4. Ischemic cardiomyopathy: I discussed with the patient to consider EP referral and ICD implantation, however both his the patient and his wife were very hesitant to proceed with this and wished to think about it for the time being.  Today aware that with low EF, there is a certain probability patient may have very dangerous type of irregular rhythm.  5. Severe MR: I discussed with both the patient and his wife, they were agreeable to proceed with TEE for further work-up  6. PAD: Denies any claudication symptoms  7. Bilateral carotid artery disease: Due for carotid Doppler near December of this year   8. AAA: Last AAA Doppler was in July 2020, 2-year follow-up Doppler was recommended.   Medication Adjustments/Labs and Tests Ordered: Current medicines are reviewed at length with the patient today.  Concerns regarding medicines are outlined above.  Orders Placed This Encounter  Procedures  . Basic metabolic panel  . CBC  . Basic metabolic panel  . CBC   No orders of the defined types were placed in this encounter.   Patient Instructions  Medication Instructions:  Your physician recommends that you continue on your current medications as directed. Please refer to the Current Medication list given to you today.  *If you need a refill on your cardiac medications before your next appointment, please call your pharmacy*  Lab Work: You will need to have labs (blood work) drawn today and again on Monday 08/05/2019:  BMET  CBC  If you have labs (blood work) drawn today and your tests are completely normal, you will receive your results only by: Marland Kitchen MyChart Message (if you have MyChart) OR . A paper copy in the mail If you have any lab test that is abnormal or we need to change your treatment, we will call you to review the results.  Testing/Procedures: Your  physician has recommended that you have a Cardioversion (DCCV). Electrical Cardioversion uses a jolt of electricity to your heart either through paddles or wired patches attached to your chest. This is a controlled, usually prescheduled, procedure. Defibrillation is done under light anesthesia in the hospital, and you usually go home the day of the procedure. This is done to get your heart back into a normal rhythm. You are not awake for the procedure. Please see the instruction sheet given to you today.   Follow-Up: At [facility], you and your health needs are our priority.  As part of our continuing mission to provide you with exceptional heart care, we have created designated Provider Care Teams.  These Care Teams include your primary Cardiologist (physician) and Advanced Practice Providers (APPs -  Physician Assistants and Nurse Practitioners) who all work together to provide you with the care you need, when you need it.  Your next appointment:   6-8 weeks  The format for your next appointment:   In Person  Provider:   Kirk Ruths, MD  Other Instructions   Dear Logan White You are scheduled for a TEE/Cardioversion/TEE Cardioversion on Thursday August 08, 2019 with Dr. [name].  Please arrive at the [facility] (Main Entrance A) at [facility]: [address] [place], [place] [address] at 2:00PM. (1 hour prior to procedure unless lab work is needed; if lab work is needed arrive 1.5 hours ahead)  DIET: Nothing to eat or drink after midnight except a sip of water with medications (see medication instructions below)  Labs: If patient is on Coumadin, patient needs pt/INR, CBC, BMET within 3 days (No pt/INR needed for patients taking Xarelto, Eliquis, Pradaxa) For patients receiving anesthesia for TEE and all Cardioversion patients: BMET, CBC within 1 week  Come to: [place] must have a responsible person to drive you home and stay in the  waiting area during your procedure. Failure to do so could result in cancellation.  Bring your insurance cards.  *Special Note: Every effort is made to have your procedure done on time. Occasionally there are emergencies that occur at the hospital that may cause delays. Please be patient if a delay does occur.       Hilbert Corrigan, [place]  07/20/2019 11:47 PM    [facility]

## 2019-07-18 NOTE — Patient Instructions (Signed)
Medication Instructions:   Your physician recommends that you continue on your current medications as directed. Please refer to the Current Medication list given to you today.  *If you need a refill on your cardiac medications before your next appointment, please call your pharmacy*  Lab Work: You will need to have labs (blood work) drawn today and again on Monday 08/05/2019:  BMET  CBC  If you have labs (blood work) drawn today and your tests are completely normal, you will receive your results only by: Marland Kitchen MyChart Message (if you have MyChart) OR . A paper copy in the mail If you have any lab test that is abnormal or we need to change your treatment, we will call you to review the results.  Testing/Procedures: Your physician has recommended that you have a Cardioversion (DCCV). Electrical Cardioversion uses a jolt of electricity to your heart either through paddles or wired patches attached to your chest. This is a controlled, usually prescheduled, procedure. Defibrillation is done under light anesthesia in the hospital, and you usually go home the day of the procedure. This is done to get your heart back into a normal rhythm. You are not awake for the procedure. Please see the instruction sheet given to you today.   Follow-Up: At [facility], you and your health needs are our priority.  As part of our continuing mission to provide you with exceptional heart care, we have created designated Provider Care Teams.  These Care Teams include your primary Cardiologist (physician) and Advanced Practice Providers (APPs -  Physician Assistants and Nurse Practitioners) who all work together to provide you with the care you need, when you need it.  Your next appointment:   6-8 weeks  The format for your next appointment:   In Person  Provider:   Kirk Ruths, MD  Other Instructions   Dear Logan White You are scheduled for a TEE/Cardioversion/TEE Cardioversion on Thursday August 08, 2019 with Dr. [name].  Please arrive at the [facility] (Main Entrance A) at [facility]: [address] [place], [place] [address] at 2:00PM. (1 hour prior to procedure unless lab work is needed; if lab work is needed arrive 1.5 hours ahead)  DIET: Nothing to eat or drink after midnight except a sip of water with medications (see medication instructions below)  Labs: If patient is on Coumadin, patient needs pt/INR, CBC, BMET within 3 days (No pt/INR needed for patients taking Xarelto, Eliquis, Pradaxa) For patients receiving anesthesia for TEE and all Cardioversion patients: BMET, CBC within 1 week  Come to: [place] must have a responsible person to drive you home and stay in the waiting area during your procedure. Failure to do so could result in cancellation.  Bring your insurance cards.  *Special Note: Every effort is made to have your procedure done on time. Occasionally there are emergencies that occur at the hospital that may cause delays. Please be patient if a delay does occur.

## 2019-07-19 LAB — CBC
Hematocrit: 38.3 % (ref 37.5–51.0)
Hemoglobin: 12.7 g/dL — ABNORMAL LOW (ref 13.0–17.7)
MCH: 27.1 pg (ref 26.6–33.0)
MCHC: 33.2 g/dL (ref 31.5–35.7)
MCV: 82 fL (ref 79–97)
Platelets: 222 10*3/uL (ref 150–450)
RBC: 4.68 x10E6/uL (ref 4.14–5.80)
RDW: 16 % — ABNORMAL HIGH (ref 11.6–15.4)
WBC: 5.4 10*3/uL (ref 3.4–10.8)

## 2019-07-19 LAB — BASIC METABOLIC PANEL
BUN/Creatinine Ratio: 16 (ref 10–24)
BUN: 28 mg/dL — ABNORMAL HIGH (ref 8–27)
CO2: 25 mmol/L (ref 20–29)
Calcium: 8.9 mg/dL (ref 8.6–10.2)
Chloride: 102 mmol/L (ref 96–106)
Creatinine, Ser: 1.79 mg/dL — ABNORMAL HIGH (ref 0.76–1.27)
GFR calc Af Amer: 42 mL/min/{1.73_m2} — ABNORMAL LOW (ref >59)
GFR calc non Af Amer: 36 mL/min/{1.73_m2} — ABNORMAL LOW (ref >59)
Glucose: 108 mg/dL — ABNORMAL HIGH (ref 65–99)
Potassium: 3.8 mmol/L (ref 3.5–5.2)
Sodium: 142 mmol/L (ref 134–144)

## 2019-07-20 ENCOUNTER — Encounter: Payer: Self-pay | Admitting: Physician Assistant

## 2019-07-24 ENCOUNTER — Telehealth: Payer: Self-pay

## 2019-07-24 NOTE — Telephone Encounter (Addendum)
Left a voice message for the patient to call the office back so that his results can be discussed and to reduce certain medications.  ----- Message from [place], [place] sent at 07/22/2019  2:21 PM EST ----- Kidney function worsened slightly, reduce both lasix and potassium supplement from BID to daily. Reduce lasix to 60mg  daily and potassium to 72meq daily

## 2019-07-29 DIAGNOSIS — Z01810 Encounter for preprocedural cardiovascular examination: Secondary | ICD-10-CM | POA: Diagnosis not present

## 2019-07-29 LAB — CBC
Hematocrit: 38.3 % (ref 37.5–51.0)
Hemoglobin: 12.7 g/dL — ABNORMAL LOW (ref 13.0–17.7)
MCH: 26.6 pg (ref 26.6–33.0)
MCHC: 33.2 g/dL (ref 31.5–35.7)
MCV: 80 fL (ref 79–97)
Platelets: 191 10*3/uL (ref 150–450)
RBC: 4.77 x10E6/uL (ref 4.14–5.80)
RDW: 15.6 % — ABNORMAL HIGH (ref 11.6–15.4)
WBC: 4.7 10*3/uL (ref 3.4–10.8)

## 2019-07-29 LAB — BASIC METABOLIC PANEL
BUN/Creatinine Ratio: 14 (ref 10–24)
BUN: 22 mg/dL (ref 8–27)
CO2: 25 mmol/L (ref 20–29)
Calcium: 9 mg/dL (ref 8.6–10.2)
Chloride: 98 mmol/L (ref 96–106)
Creatinine, Ser: 1.55 mg/dL — ABNORMAL HIGH (ref 0.76–1.27)
GFR calc Af Amer: 50 mL/min/{1.73_m2} — ABNORMAL LOW (ref >59)
GFR calc non Af Amer: 43 mL/min/{1.73_m2} — ABNORMAL LOW (ref >59)
Glucose: 167 mg/dL — ABNORMAL HIGH (ref 65–99)
Potassium: 3.3 mmol/L — ABNORMAL LOW (ref 3.5–5.2)
Sodium: 139 mmol/L (ref 134–144)

## 2019-07-29 NOTE — Addendum Note (Signed)
Addended by: Darliss Ridgel D on: 07/29/2019 09:58 AM   Modules accepted: Orders

## 2019-08-02 ENCOUNTER — Other Ambulatory Visit: Payer: Self-pay | Admitting: Medical

## 2019-08-03 ENCOUNTER — Other Ambulatory Visit: Payer: Self-pay | Admitting: Cardiology

## 2019-08-05 ENCOUNTER — Other Ambulatory Visit: Payer: Self-pay

## 2019-08-05 ENCOUNTER — Other Ambulatory Visit: Payer: Self-pay | Admitting: Physician Assistant

## 2019-08-05 ENCOUNTER — Other Ambulatory Visit (HOSPITAL_COMMUNITY): Payer: Medicare HMO

## 2019-08-05 DIAGNOSIS — Z20822 Contact with and (suspected) exposure to covid-19: Secondary | ICD-10-CM

## 2019-08-06 ENCOUNTER — Other Ambulatory Visit: Payer: Self-pay

## 2019-08-06 LAB — NOVEL CORONAVIRUS, NAA: SARS-CoV-2, NAA: NOT DETECTED

## 2019-08-06 NOTE — Telephone Encounter (Signed)
This was a one time prescription. Patient was instructed that additional refills will need to be prescribed by his PCP.

## 2019-08-07 ENCOUNTER — Ambulatory Visit (HOSPITAL_COMMUNITY)
Admission: RE | Admit: 2019-08-07 | Discharge: 2019-08-07 | Disposition: A | Discharge status: Home / Self Care | Payer: Medicare HMO | Source: Ambulatory Visit | Attending: Cardiovascular Disease | Admitting: Cardiovascular Disease

## 2019-08-07 ENCOUNTER — Other Ambulatory Visit: Payer: Self-pay

## 2019-08-07 ENCOUNTER — Other Ambulatory Visit: Payer: Self-pay | Admitting: Cardiology

## 2019-08-07 ENCOUNTER — Other Ambulatory Visit: Payer: Self-pay | Admitting: *Deleted

## 2019-08-07 DIAGNOSIS — I679 Cerebrovascular disease, unspecified: Secondary | ICD-10-CM | POA: Insufficient documentation

## 2019-08-07 MED ORDER — ALBUTEROL SULFATE HFA 108 (90 BASE) MCG/ACT IN AERS: 2.0000 | INHALATION_SPRAY | Freq: Four times a day (QID) | RESPIRATORY_TRACT | 0 refills | Status: DC | PRN

## 2019-08-07 NOTE — Progress Notes (Signed)
Pre-op call done for procedure tomorrow, patient states they have been quarantined since COVID test. All questions addressed.

## 2019-08-07 NOTE — Progress Notes (Signed)
Logan White was having some breathing difficulty while undergoing his carotid duplex ultrasound today. He has noticed some wheezing and asked for a new prescription for his inhaler. He still has substantial bilateral leg edema despite wearing compression stockings and taking furosemide 60 mg twice daily. He has very mild wheezing on exam, but does not have rales and does not appear to be in acute respiratory distress.  2-3+ symmetrical calf edema.  Irregular rhythm, rate controlled.  Apical holosystolic murmur. I sent in a new prescription for his inhaler (it has run out) but also recommended that he increase his furosemide to 80 mg twice daily, between now and his TEE procedure scheduled for tomorrow.  Sanda Klein, MD, [facility] [phone] office [phone] pager

## 2019-08-08 ENCOUNTER — Encounter (HOSPITAL_COMMUNITY)
Admission: RE | Disposition: A | Discharge status: Home / Self Care | Payer: Self-pay | Source: Home / Self Care | Attending: Cardiology

## 2019-08-08 ENCOUNTER — Ambulatory Visit (HOSPITAL_COMMUNITY)
Admission: RE | Admit: 2019-08-08 | Discharge: 2019-08-08 | Disposition: A | Discharge status: Home / Self Care | Payer: Medicare HMO | Attending: Cardiology | Admitting: Cardiology

## 2019-08-08 ENCOUNTER — Ambulatory Visit (HOSPITAL_BASED_OUTPATIENT_CLINIC_OR_DEPARTMENT_OTHER)
Admission: RE | Admit: 2019-08-08 | Discharge: 2019-08-08 | Disposition: A | Discharge status: Home / Self Care | Payer: Medicare HMO | Source: Ambulatory Visit | Attending: Physician Assistant | Admitting: Physician Assistant

## 2019-08-08 ENCOUNTER — Other Ambulatory Visit: Payer: Self-pay

## 2019-08-08 ENCOUNTER — Encounter (HOSPITAL_COMMUNITY): Payer: Self-pay | Admitting: Cardiology

## 2019-08-08 DIAGNOSIS — Z955 Presence of coronary angioplasty implant and graft: Secondary | ICD-10-CM | POA: Insufficient documentation

## 2019-08-08 DIAGNOSIS — I255 Ischemic cardiomyopathy: Secondary | ICD-10-CM | POA: Insufficient documentation

## 2019-08-08 DIAGNOSIS — Z8719 Personal history of other diseases of the digestive system: Secondary | ICD-10-CM | POA: Diagnosis not present

## 2019-08-08 DIAGNOSIS — I351 Nonrheumatic aortic (valve) insufficiency: Secondary | ICD-10-CM

## 2019-08-08 DIAGNOSIS — I130 Hypertensive heart and chronic kidney disease with heart failure and stage 1 through stage 4 chronic kidney disease, or unspecified chronic kidney disease: Secondary | ICD-10-CM | POA: Insufficient documentation

## 2019-08-08 DIAGNOSIS — E1151 Type 2 diabetes mellitus with diabetic peripheral angiopathy without gangrene: Secondary | ICD-10-CM | POA: Diagnosis not present

## 2019-08-08 DIAGNOSIS — I5022 Chronic systolic (congestive) heart failure: Secondary | ICD-10-CM | POA: Diagnosis not present

## 2019-08-08 DIAGNOSIS — E1122 Type 2 diabetes mellitus with diabetic chronic kidney disease: Secondary | ICD-10-CM | POA: Diagnosis not present

## 2019-08-08 DIAGNOSIS — Z8249 Family history of ischemic heart disease and other diseases of the circulatory system: Secondary | ICD-10-CM | POA: Diagnosis not present

## 2019-08-08 DIAGNOSIS — I340 Nonrheumatic mitral (valve) insufficiency: Secondary | ICD-10-CM

## 2019-08-08 DIAGNOSIS — I083 Combined rheumatic disorders of mitral, aortic and tricuspid valves: Secondary | ICD-10-CM | POA: Diagnosis not present

## 2019-08-08 DIAGNOSIS — Z8673 Personal history of transient ischemic attack (TIA), and cerebral infarction without residual deficits: Secondary | ICD-10-CM | POA: Diagnosis not present

## 2019-08-08 DIAGNOSIS — Z8674 Personal history of sudden cardiac arrest: Secondary | ICD-10-CM | POA: Diagnosis not present

## 2019-08-08 DIAGNOSIS — I4821 Permanent atrial fibrillation: Secondary | ICD-10-CM | POA: Insufficient documentation

## 2019-08-08 DIAGNOSIS — Z7901 Long term (current) use of anticoagulants: Secondary | ICD-10-CM | POA: Diagnosis not present

## 2019-08-08 DIAGNOSIS — E782 Mixed hyperlipidemia: Secondary | ICD-10-CM | POA: Insufficient documentation

## 2019-08-08 DIAGNOSIS — I714 Abdominal aortic aneurysm, without rupture: Secondary | ICD-10-CM | POA: Diagnosis not present

## 2019-08-08 DIAGNOSIS — N183 Chronic kidney disease, stage 3 unspecified: Secondary | ICD-10-CM | POA: Diagnosis not present

## 2019-08-08 DIAGNOSIS — Z87891 Personal history of nicotine dependence: Secondary | ICD-10-CM | POA: Diagnosis not present

## 2019-08-08 DIAGNOSIS — I2510 Atherosclerotic heart disease of native coronary artery without angina pectoris: Secondary | ICD-10-CM | POA: Insufficient documentation

## 2019-08-08 HISTORY — PX: TEE WITHOUT CARDIOVERSION: SHX5443

## 2019-08-08 SURGERY — ECHOCARDIOGRAM, TRANSESOPHAGEAL
Anesthesia: Moderate Sedation

## 2019-08-08 MED ORDER — APIXABAN 5 MG PO TABS: 5.0000 mg | ORAL_TABLET | Freq: Two times a day (BID) | ORAL | 6 refills | Status: DC

## 2019-08-08 MED ORDER — FENTANYL CITRATE (PF) 100 MCG/2ML IJ SOLN
INTRAMUSCULAR | Status: AC
  Filled 2019-08-08: qty 4

## 2019-08-08 MED ORDER — MIDAZOLAM HCL (PF) 10 MG/2ML IJ SOLN
INTRAMUSCULAR | Status: DC | PRN
  Administered 2019-08-08: 2 mg via INTRAVENOUS

## 2019-08-08 MED ORDER — MIDAZOLAM HCL (PF) 5 MG/ML IJ SOLN
INTRAMUSCULAR | Status: AC
  Filled 2019-08-08: qty 2

## 2019-08-08 MED ORDER — BUTAMBEN-TETRACAINE-BENZOCAINE 2-2-14 % EX AERO
INHALATION_SPRAY | CUTANEOUS | Status: DC | PRN
  Administered 2019-08-08: 2 via TOPICAL

## 2019-08-08 MED ORDER — FENTANYL CITRATE (PF) 100 MCG/2ML IJ SOLN
INTRAMUSCULAR | Status: DC | PRN
  Administered 2019-08-08: 25 ug via INTRAVENOUS

## 2019-08-08 MED ORDER — SODIUM CHLORIDE 0.9 % IV SOLN
INTRAVENOUS | Status: AC | PRN
  Administered 2019-08-08: 500 mL via INTRAVENOUS

## 2019-08-08 MED ORDER — DIPHENHYDRAMINE HCL 50 MG/ML IJ SOLN
INTRAMUSCULAR | Status: AC
  Filled 2019-08-08: qty 1

## 2019-08-08 NOTE — Discharge Instructions (Signed)

## 2019-08-08 NOTE — Interval H&P Note (Signed)
History and Physical Interval Note:  08/08/2019 1:31 PM  Logan White  has presented today for surgery, with the diagnosis of SEVERE MITRAL REGURGITATION.  The various methods of treatment have been discussed with the patient and family. After consideration of risks, benefits and other options for treatment, the patient has consented to  Procedure(s): TRANSESOPHAGEAL ECHOCARDIOGRAM (TEE) (N/A) as a surgical intervention.  The patient's history has been reviewed, patient examined, no change in status, stable for surgery.  I have reviewed the patient's chart and labs.  Questions were answered to the patient's satisfaction.     Kirk Ruths

## 2019-08-08 NOTE — H&P (Signed)
Office Visit   Go to Cards  07/18/2019  [facility], [place], [place]   Cardiology        Chronic systolic heart failure ([place]) +9 more   Dx        Follow-up  seen for Dr. [name].    Reason for Visit        Additional Documentation   Vitals:       BP 110/60       Pulse 92       Ht 6' 3.5" (1.918 m)       Wt 78 kg       SpO2 94%       BMI 21.21 kg/m       BSA 2.04 m             More Vitals    Flowsheets:        Anthropometrics,       NEWS,       MEWS Score     Encounter Info:        Billing Info,       History,       Allergies,       Detailed Report            All Notes      Addendum Note by Cherylann Parr, [place] at 07/18/2019 12:00 PM   Author: Cherylann Parr, [place] Author Type: Technician Filed: 07/29/2019  9:58 AM  Note Status: Signed Cosign: Cosign Not Required Encounter Date: 07/18/2019  Editor: Darliss Ridgel (Technician)           Addended by: Darliss Ridgel D on: 07/29/2019 09:58 AM      Modules accepted: Orders          Progress Notes by Almyra Deforest, [place] at 07/18/2019 12:00 PM   Author: Almyra Deforest, PA Author Type: Physician Assistant Filed: 07/20/2019 11:47 PM  Note Status: Signed Cosign: Cosign Not Required Encounter Date: 07/18/2019  Editor: Almyra Deforest, [place] (Physician Assistant)      Expand All Collapse All           untitled image   Cardiology Office Note:        Date:  07/20/2019      ID:  Logan White, DOB 02/25/1941, MRN [id number]     PCP:  Patient, No Pcp Per       Cardiologist:  Kirk Ruths, MD   Electrophysiologist:  None      Referring MD: No ref. provider found           Chief Complaint    Patient presents with    .   Follow-up            seen for Dr. [name].           History of Present Illness:        Logan White is a 78 y.o. male with a hx of permanent atrial fibrillation, CAD, prior V. fib arrest, and ischemic cardiomyopathy.  He had PCI of LAD October 2004.  He was admitted in June 2013 with V. fib arrest in the setting of severe hypokalemia with potassium 2.2.  Cardiac catheterization in April 2014 showed patent stent in the LAD with 40% in-stent restenosis, apical LAD had 80% lesion, left circumflex artery was occluded, right coronary artery was small and nondominant.  He had attempted at CTO PCI of left circumflex however this was  unsuccessful.  Echocardiogram in April 2014 showed EF down to 30 to 35%, mild LAE, moderately reduced RV function with mild MR.  He was seen by Dr. Lovena Le who felt ICD was not indicated at the time because patient did suffer NSTEMI with positive enzymes.  He was discharged with what [place] in January 2017 showed fixed inferior and apical defect with no ischemia.  Patient had lower extremity angiography in April 2017 that showed occluded right external iliac and the common femoral artery not amenable to PCI and 80% left common femoral artery stenosis, occluded left SFA also not amenable to PCI.  He was managed medically.  Echocardiogram in March 2018 showed EF 30 to 35%, moderate AI, severe MR, biatrial enlargement.  Carotid Doppler obtained in November 2019 showed moderate disease bilaterally, 79-month follow-up study was recommended.  AAA Doppler in July 2020 showed 3 cm dilatation, 2-year follow-up study was recommended.      Patient was previously seen by Dr. [name] in October 2019 at which time he was doing well.  He was admitted in October 2020 with lower extremity edema and shortness of breath.  He recently went to [place] and then noticed his lower extremity edema worsened on his way back.  He was treated with IV Lasix with improvement.  Echocardiogram obtained during this admission showed EF 30 to 35%, severe MR, moderate AR.  He was seen by Dr. Harrington Challenger who  recommended addition of low-dose Imdur.  Hydralazine and losartan were discontinued during this hospitalization.  Creatinine was trending up prior to discharge.  Dr. Harrington Challenger recommended discharge on 60 mg every other day of Lasix.      When he returned, he has gained at least 3.5 pounds.  On physical exam, he had 1-2+ pitting edema.  I increased his Lasix to 60 mg daily and increase potassium to 20 meq daily.  He was seen by Roby Lofts, PA-C on 07/10/2019, he was still volume overloaded at the time, therefore Lasix dose was doubled.  He presents today for follow-up, he does not have any significant lower extremity edema at this stage.  I recommended to continue the current therapy.  I will obtain a basic metabolic panel to make sure he is not dehydrated.  I discussed with the patient both ICD implantation and work-up for his severe MR that was seen on the last echocardiogram.  Both the patient and his wife wish to think about ICD at this point, they are hesitant to proceed immediately therefore did not agree to proceed with EP referral.  They are willing to proceed with additional work-up for his severe MR though.  I have arranged transesophageal echocardiogram.  Risk and benefit has been discussed with the patient and his wife, who were agreeable to proceed.             Past Medical History:    Diagnosis   Date    .   AAA (abdominal aortic aneurysm) ([place])            a. 08/2015 Abd U/S: 2.7 x 2.9 x 3.2 cm infrarenal AAA.    .   Bradycardia            a. Requiring discontinuation of use of BB/CCB    .   Cardiac arrest - ventricular fibrillation            a. 03/2012 in setting of hypokalemia (prolonged hosp with VDRF, tracheobronchitis, ARF, shock liver, PAF, AMS felt secondary to post-anoxic encephalopathy/shock).    Marland Kitchen  Carotid artery stenosis            a. 01/2011 - 40-59% bilateral stenosis;  b. 06/2015 Carotid U/S: 40-59% bilat ICA stenosis->f/u 6  mos.    .   CKD (chronic kidney disease), stage III        .   Coronary artery disease            a. s/p aborted ant STEMI tx with Cypher DES to LAD 10/04 (residual at cath: D1 50%, CFX 40% and multiple dist 70%, EF 55%);   b. myoview 3/10: Ef 47%, infero-apical isch, LOW RISK - med Tx recommended;  c. 12/2012 VF Arrest/Cath: LAD 40isr, 80 apical, LCX 100 (failed PTCA), RCA nondom.    .   Diabetes mellitus        .   Diverticulosis   2001    .   Elevated LFTs            Shock liver 03/2012    .   H/O: CVA (cardiovascular accident)        .   Hematemesis            a. 12/2010 felt 2/2 Mallory Weiss tear - pt could not afford colonscopy/EGD at that time so Coumadin was deferred. Coumadin initiated 03/2012 without any evidence for bleeding.    Marland Kitchen   History of Ischemic Cardiomyopathy            a. EF 50-55% by echo 03/09/12 (was 30% by echo 02/24/12); b. 03/2013 Echo: EF 55-60%, mild MR, sev dil LA, mod dil RA, PASP 98mmHg.    Marland Kitchen   Hypertensive heart disease            a. echo 4/12: EF 50%, asymmetric septal hypertrophy, no SAM or LVOT gradient, LAE, PASP 35    .   Inguinal hernia        .   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   Mitral regurgitation            a. Mild by echo 03/2012 and 03/2013.    .   Mixed Hyperlipidemia        .   Peripheral vascular disease ([place])            a. h/o LE angioplasty;  b. 08/2015 Duplex: >50% RCIA, 100% REIA, >50% LEIA, elev vel in SMA, patent IVC.    Marland Kitchen   Persistent atrial fibrillation ([place])            a. Dx 12/2010-->coumadin (CHA2DS2VASc = 7).    .   QT prolongation                   Past Surgical History:    Procedure   Laterality   Date    .   HERNIA REPAIR            .   LEFT HEART CATH   N/A   12/30/2012        Procedure: LEFT HEART CATH;  Surgeon: [name],  MD;  Location: [facility] CATH LAB;  Service: Cardiovascular;  Laterality: N/A;    .   ORIF TIBIA & FIBULA FRACTURES       01/11/07        OPEN TX OF UNICONDYLAR PLATEAU FRACTURE, IRRIGATION/DEBRIDEMENT OF OPEN FRACTURE INCLUDING BONE, REMOVAL OF EXTERNAL FIXATOR UNDER ANESTHESIA PER DR. MICHAEL HANDY    .   PERIPHERAL VASCULAR CATHETERIZATION  N/A   12/21/2015        Procedure: Lower Extremity Angiography;  Surgeon: [name], MD;  Location: [facility] CV LAB;  Service: Cardiovascular;  Laterality: N/A;    .   POPLITEAL ARTERY ANGIOPLASTY       01/07/07        s/p LEFT POPLITEAL ARTERY EXPLORATION AND VEIN PATCH ANGIOPLASTY, PER DR. EARLY, SECONDARY TO ISCHEMIC LEFT FOOT RELATED TO LEFT POPLITEAL ARTERY INJURY    .   SKIN GRAFT                 Current Medications:  Active Medications                                                                                                                      Allergies:   Patient has no known allergies.       Social History             Socioeconomic History    .   Marital status:   Married            Spouse name:   Not on file    .   Number of children:   4    .   Years of education:   Not on file    .   Highest education level:   Not on file    Occupational History    .   Occupation:   FOREMAN FOR A [employer]            Employer:   [employer]    .   Financial resource strain:   Not on file    .   Food insecurity            Worry:   Not on file            Inability:   Not on file    .   Transportation needs            Medical:   Not on file            Non-medical:   Not on file    Tobacco Use    .   Smoking status:   Current Some Day Smoker            Packs/day:   0.50             Years:   30.00            Pack years:   15.00            Types:   Cigarettes            Last attempt to quit:   03/12/2012            Years since quitting:   7.3    .   Smokeless tobacco:   Never Used    Substance and Sexual Activity    .  Alcohol use:   No            Alcohol/week:   0.0 standard drinks    .   Drug use:   No            Frequency:   2.0 times per week    .   Sexual activity:   Not on file    Lifestyle    .   Physical activity            Days per week:   Not on file            Minutes per session:   Not on file    .   Stress:   Not on file    Relationships    .   Social Airline pilot on phone:   Not on file            Gets together:   Not on file            Attends religious service:   Not on file            Active member of club or organization:   Not on file            Attends meetings of clubs or organizations:   Not on file            Relationship status:   Not on file    Other Topics   Concern    .   Not on file    Social History Narrative        ** Merged History Encounter **                 MARRIED        FULL TIME FOREMAN FOR A CONSTRUCTION CREW        TOBACCO USE. YES. 1/2 -1 PPD OF CIGARETTES         NO ETOH          Family History:  The patient's family history includes Brain cancer in his mother; Heart disease in his father; Heart failure in his father; Leukemia in his mother; Sickle cell anemia in his mother and another family member.     ROS:    Please see the history of present illness.      All other systems reviewed and are negative.      EKGs/Labs/Other Studies Reviewed:        The following studies were reviewed today:        EKG:  EKG is not ordered today.       Recent Labs:  06/22/2019: B  Natriuretic Peptide 1,512.4  06/23/2019: ALT 38; Magnesium 2.0  07/18/2019: BUN 28; Creatinine, Ser 1.79; Hemoglobin 12.7; Platelets 222; Potassium 3.8; Sodium 142   Recent Lipid Panel  Labs (Brief)  Physical Exam:        VS:  BP 110/60   Pulse 92   Ht 6' 3.5" (1.918 m)   Wt 172 lb (78 kg)   SpO2 94%   BMI 21.21 kg/m            Wt Readings from Last 3 Encounters:    07/18/19   172 lb (78 kg)    07/10/19   169 lb 3.2 oz (76.7 kg)    06/27/19   170 lb 3.2 oz (77.2 kg)          GEN:  Well nourished, well developed in no acute distress  HEENT: Normal  NECK: No JVD; No carotid bruits  LYMPHATICS: No lymphadenopathy  CARDIAC: Irregularly irregular, no murmurs, rubs, gallops  RESPIRATORY:  Clear to auscultation without rales, wheezing or rhonchi   ABDOMEN: Soft, non-tender, non-distended  MUSCULOSKELETAL:  No edema; No deformity   SKIN: Warm and dry  NEUROLOGIC:  Alert and oriented x 3  PSYCHIATRIC:  Normal affect       ASSESSMENT:         1.   Chronic systolic heart failure ([place])     2.   Medication management     3.   Pre-procedural cardiovascular examination     4.   Coronary artery disease involving native coronary artery of native heart without angina pectoris     5.   Permanent atrial fibrillation ([place])     6.   Ischemic cardiomyopathy     7.   PAD (peripheral artery disease) ([place])     8.   Bilateral carotid artery stenosis     9.   AAA (abdominal aortic aneurysm) without rupture ([place])     10.   Severe mitral regurgitation        PLAN:        In order of problems listed above:     1.Chronic systolic heart failure: His Lasix dose was doubled during the last  office visit.  I recommended basic metabolic panel today to make sure he is not dehydrated.      2.CAD: Denies any chest pain.  On Lipitor.  Not on aspirin given the need for Eliquis.      3.Permanent atrial fibrillation: Rate controlled on metoprolol.  Anticoagulated with Eliquis      4.Ischemic cardiomyopathy: I discussed with the patient to consider EP referral and ICD implantation, however both his the patient and his wife were very hesitant to proceed with this and wished to think about it for the time being.  Today aware that with low EF, there is a certain probability patient may have very dangerous type of irregular rhythm.      5.Severe MR: I discussed with both the patient and his wife, they were agreeable to proceed with TEE for further work-up      6.PAD: Denies any claudication symptoms      7.Bilateral carotid artery disease: Due for carotid Doppler near December of this year       8.AAA: Last AAA Doppler was in July 2020, 2-year follow-up Doppler was recommended.         Medication Adjustments/Labs and Tests Ordered:  Current medicines are reviewed at length with the patient today.  Concerns regarding medicines are outlined above.       Orders Placed This Encounter    Procedures    .   Basic metabolic panel    .   CBC    .  Basic metabolic panel    .   CBC      No orders of the defined types were placed in this encounter.        Patient Instructions    Medication Instructions:   .Your physician recommends that you continue on your current medications as directed. Please refer to the Current Medication list given to you today.      *If you need a refill on your cardiac medications before your next appointment, please call your pharmacy*     Lab Work:  You will need to have labs (blood work) drawn today and again on Monday 08/05/2019:  .BMET   .CBC      If you have labs (blood work) drawn today  and your tests are completely normal, you will receive your results only by:  .MyChart Message (if you have MyChart) OR   .A paper copy in the mail   If you have any lab test that is abnormal or we need to change your treatment, we will call you to review the results.     Testing/Procedures:  Your physician has recommended that you have a Cardioversion (DCCV). Electrical Cardioversion uses a jolt of electricity to your heart either through paddles or wired patches attached to your chest. This is a controlled, usually prescheduled, procedure. Defibrillation is done under light anesthesia in the hospital, and you usually go home the day of the procedure. This is done to get your heart back into a normal rhythm. You are not awake for the procedure. Please see the instruction sheet given to you today.        Follow-Up:  At [facility], you and your health needs are our priority.  As part of our continuing mission to provide you with exceptional heart care, we have created designated Provider Care Teams.  These Care Teams include your primary Cardiologist (physician) and Advanced Practice Providers (APPs -  Physician Assistants and Nurse Practitioners) who all work together to provide you with the care you need, when you need it.     Your next appointment:    6-8 weeks     The format for your next appointment:    In Person     Provider:    Kirk Ruths, MD     Other Instructions       Dear Marlana Latus  You are scheduled for a TEE/Cardioversion/TEE Cardioversion on Thursday August 08, 2019 with Dr. [name].  Please arrive at the [facility] (Main Entrance A) at [facility]: [address] [place], [place] [address] at 2:00PM. (1 hour prior to procedure unless lab work is needed; if lab work is needed arrive 1.5 hours ahead)     DIET: Nothing to eat or drink after midnight except a sip of water with medications (see medication instructions  below)     Labs: If patient is on Coumadin, patient needs pt/INR, CBC, BMET within 3 days (No pt/INR needed for patients taking Xarelto, Eliquis, Pradaxa)  For patients receiving anesthesia for TEE and all Cardioversion patients: BMET, CBC within 1 week     Come to: [facility] must have a responsible person to drive you home and stay in the waiting area during your procedure. Failure to do so could result in cancellation.     Bring your insurance cards.     *Special Note: Every effort is made to have your procedure done on time. Occasionally there are emergencies  that occur at the hospital that may cause delays. Please be patient if a delay does occur.               Hilbert Corrigan, [place]   07/20/2019 11:47 PM      [facility]    For TEE; no changes. Kirk Ruths

## 2019-08-08 NOTE — Progress Notes (Signed)
Echocardiogram Echocardiogram Transesophageal has been performed.  Oneal Deputy Rosmery Duggin 08/08/2019, 2:41 PM

## 2019-08-08 NOTE — Progress Notes (Signed)
Dr. [name] requested to start patient on Eliquis 5 mg BID. He should be on this for permanent atrial fibrillation. Eliquis listed on his list when seen by Almyra Deforest 07/18/2019. However, not today.   Patient is not able to provide any detained information as he is lethargic from anesthesia. Hgb was stable prior to procedure. Will sent scrip to his pharmacy> patient will pick up tomorrow. Update nurse. Has follow up with Dr. [name] in 3 weeks.

## 2019-08-08 NOTE — Progress Notes (Addendum)
    Transesophageal Echocardiogram Note  LATOYA DISKIN [id number] 1941/02/21  Procedure: Transesophageal Echocardiogram Indications: MR  Procedure Details Consent: Obtained Time Out: Verified patient identification, verified procedure, site/side was marked, verified correct patient position, special equipment/implants available, Radiology Safety Procedures followed,  medications/allergies/relevent history reviewed, required imaging and test results available.  Performed  Medications:  During this procedure the patient is administered a total of Versed 2 mg and Fentanyl 25 mcg  to achieve and maintain moderate conscious sedation.  The patient's heart rate, blood pressure, and oxygen saturation are monitored continuously during the procedure. The period of conscious sedation is 30 minutes, of which I was present face-to-face 100% of this time.  EF 25-30; biatrial enlargement; 3+ MR Pt states unsure if he is taking apixaban; I have asked him to take 5 mg BID for atrial fibrillation.   Complications: No apparent complications Patient did tolerate procedure well.  Kirk Ruths, MD

## 2019-08-09 ENCOUNTER — Other Ambulatory Visit: Payer: Self-pay | Admitting: *Deleted

## 2019-08-09 ENCOUNTER — Encounter: Payer: Self-pay | Admitting: *Deleted

## 2019-08-09 DIAGNOSIS — I679 Cerebrovascular disease, unspecified: Secondary | ICD-10-CM

## 2019-08-12 ENCOUNTER — Telehealth: Payer: Self-pay

## 2019-08-12 NOTE — Telephone Encounter (Signed)
Received a message from staff - pt had TEE 08/08/2019. Pt complaining of pain and swelling in both legs. Pain has been non-stop since procedure 12/3. He describes it as a burning sensation. Denies chest pain, just strictly swollen legs and burning sensation. Takes lasix fluid pills in morning and afternoon. States that he gets SOB when the pain gets worse. Will forward to Dr [name] for review.

## 2019-08-12 NOTE — Telephone Encounter (Signed)
Follow up scheduled

## 2019-08-12 NOTE — Telephone Encounter (Signed)
Please schedule fuov with me or APP for CHF Logan White

## 2019-08-13 ENCOUNTER — Telehealth: Payer: Self-pay

## 2019-08-13 NOTE — Telephone Encounter (Addendum)
Left a vocie message for the patient to give our office a call and someone will go over his results.   ----- Message from [place], [place] sent at 08/12/2019 11:08 AM EST ----- Reviewed TEE by Dr. [name], the previously seen severe mitral valve leakage appears to be more moderate in nature. Therefore does not need surgery at this time. The pumping function of heart remain weakened.

## 2019-08-13 NOTE — Progress Notes (Signed)
Cardiology Office Note   Date:  08/14/2019   ID:  Darrold, Bezek 1941-06-11, MRN [id number]  PCP:  Patient, No Pcp Per  Cardiologist:  Dr. [name] CC: Lower Extremity Edema   History of Present Illness: Logan White is a 78 y.o. male who presents for ongoing assessment and management of severe mitral regurgitation.  The patient had a TEE completed on 08/08/2019 by Dr. [name].  There was a question of whether or not the patient was taking apixaban as directed at 5 mg twice daily as he has a history of permanent atrial fibrillation.    Other cardiac history includes ischemic cardiomyopathy, CAD with PCI of the LAD in 06/2003., prior V. fib arrest.  Cardiac catheterization in 2014 revealed patent stent in the LAD with 40% in-stent restenosis, the apical LAD had 80% lesion.  The left circumflex artery was occluded and the right coronary artery was small and nondominant. At that cath the patient had an attempted CTO PCI of the left circumflex but this was unsuccessful.  He has a history of PAD, with lower extremity angiography April 2017 revealing occluded right external iliac and common femoral artery not amenable to PCI, and an 80% left common femoral artery stenosis, with occluded left SFA which was also not amendable to PCI.  Therefore managed medically.  He was seen in the office by Almyra Deforest, [place] on 06/27/2019.  He was noted to have evidence of fluid overload.  Lasix was increased to 60 mg daily along with potassium 20 mEq daily.  Due to question about his LV dysfunction and severe MR, he was scheduled for a TEE to evaluate his candidacy for mitral valve clip.  TEE on 08/08/2019 revealed only moderate mitral valve regurgitation, (3+).Marland Kitchen  LVEF was 25% to 30%.  Moderately reduced systolic right ventricular function.  With moderate RV enlargement.  There was severe plaque involving the descending aorta, with severe LAE.  It was felt that the patient was not a candidate for mitral valve  surgery.  He has been seen in the past by EP who did not feel ICD was indicated.  He called our office on 08/13/2019 with complaints of worsening lower extremity edema.  He was made an appointment at the request of Dr. [name] with me to evaluate him further.  He comes today having gained approximately 15 pounds since his last documented weight.  Weight today 187 pounds prior weight documented 172 pounds.  He complains of lower extremity edema, leg pain, and PND with dyspnea.  He is having significant pain in his lower extremities with burning feeling.   Past Medical History:  Diagnosis Date  . AAA (abdominal aortic aneurysm) ([place])    a. 08/2015 Abd U/S: 2.7 x 2.9 x 3.2 cm infrarenal AAA.  . Bradycardia    a. Requiring discontinuation of use of BB/CCB  . Cardiac arrest - ventricular fibrillation    a. 03/2012 in setting of hypokalemia (prolonged hosp with VDRF, tracheobronchitis, ARF, shock liver, PAF, AMS felt secondary to post-anoxic encephalopathy/shock).  . Carotid artery stenosis    a. 01/2011 - 40-59% bilateral stenosis;  b. 06/2015 Carotid U/S: 40-59% bilat ICA stenosis->f/u 6 mos.  . CKD (chronic kidney disease), stage III   . Coronary artery disease    a. s/p aborted ant STEMI tx with Cypher DES to LAD 10/04 (residual at cath: D1 50%, CFX 40% and multiple dist 70%, EF 55%);   b. myoview 3/10: Ef 47%, infero-apical isch, LOW RISK - med Tx  recommended;  c. 12/2012 VF Arrest/Cath: LAD 40isr, 80 apical, LCX 100 (failed PTCA), RCA nondom.  . Diabetes mellitus   . Diverticulosis 2001  . Elevated LFTs    Shock liver 03/2012  . H/O: CVA (cardiovascular accident)   . Hematemesis    a. 12/2010 felt 2/2 Mallory Weiss tear - pt could not afford colonscopy/EGD at that time so Coumadin was deferred. Coumadin initiated 03/2012 without any evidence for bleeding.  Marland Kitchen History of Ischemic Cardiomyopathy    a. EF 50-55% by echo 03/09/12 (was 30% by echo 02/24/12); b. 03/2013 Echo: EF 55-60%, mild MR, sev dil  LA, mod dil RA, PASP 16mmHg.  Marland Kitchen Hypertensive heart disease    a. echo 4/12: EF 50%, asymmetric septal hypertrophy, no SAM or LVOT gradient, LAE, PASP 35  . Inguinal hernia   . 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 Mitral regurgitation    a. Mild by echo 03/2012 and 03/2013.  . Mixed Hyperlipidemia   . Peripheral vascular disease ([place])    a. h/o LE angioplasty;  b. 08/2015 Duplex: >50% RCIA, 100% REIA, >50% LEIA, elev vel in SMA, patent IVC.  Marland Kitchen Persistent atrial fibrillation ([place])    a. Dx 12/2010-->coumadin (CHA2DS2VASc = 7).  . QT prolongation     Past Surgical History:  Procedure Laterality Date  . HERNIA REPAIR    . LEFT HEART CATH N/A 12/30/2012   Procedure: LEFT HEART CATH;  Surgeon: [name], MD;  Location: [facility] C[facility] LAB;  Service: Cardiovascular;  Laterality: N/A;  . ORIF TIBIA & FIBULA FRACTURES  01/11/07   OPEN TX OF UNICONDYLAR PLATEAU FRACTURE, IRRIGATION/DEBRIDEMENT OF OPEN FRACTURE INCLUDING BONE, REMOVAL OF EXTERNAL FIXATOR UNDER ANESTHESIA PER DR. MICHAEL HANDY  . PERIPHERAL VASCULAR CATHETERIZATION N/A 12/21/2015   Procedure: Lower Extremity Angiography;  Surgeon: [name], MD;  Location: [place] [facility] CV LAB;  Service: Cardiovascular;  Laterality: N/A;  . POPLITEAL ARTERY ANGIOPLASTY  01/07/07   s/p LEFT POPLITEAL ARTERY EXPLORATION AND VEIN PATCH ANGIOPLASTY, PER DR. EARLY, SECONDARY TO ISCHEMIC LEFT FOOT RELATED TO LEFT POPLITEAL ARTERY INJURY  . SKIN GRAFT    . TEE WITHOUT CARDIOVERSION N/A 08/08/2019   Procedure: TRANSESOPHAGEAL ECHOCARDIOGRAM (TEE);  Surgeon: Logan Perla, MD;  Location: [facility] ENDOSCOPY;  Service: Cardiovascular;  Laterality: N/A;     Current Outpatient Medications  Medication Sig Dispense Refill  . albuterol (VENTOLIN HFA) 108 (90 Base) MCG/ACT inhaler Inhale 2 puffs into the lungs every 6 (six) hours as needed for wheezing or shortness of breath. 90 g 0  . apixaban (ELIQUIS) 5 MG TABS tablet Take 1  tablet (5 mg total) by mouth 2 (two) times daily. 60 tablet 6  . atorvastatin (LIPITOR) 80 MG tablet TAKE 1 TABLET BY MOUTH EVERY DAY (Patient taking differently: Take 80 mg by mouth every evening. ) 90 tablet 2  . metoprolol succinate (TOPROL-XL) 100 MG 24 hr tablet Take 1 tablet (100 mg total) by mouth daily. Please keep upcoming appt for further refills. Thank you (Patient taking differently: Take 100 mg by mouth every evening. Please keep upcoming appt for further refills. Thank you) 30 tablet 0  . nitroGLYCERIN (NITROSTAT) 0.4 MG SL tablet Place 1 tablet (0.4 mg total) under the tongue every 5 (five) minutes as needed for chest pain (Up to 3 doses). 25 tablet 3  . potassium chloride SA (KLOR-CON M20) 20 MEQ tablet Take 1 tablet (20 mEq total) by mouth 2 (two) times daily for 30 doses. Take on  days when taking lasix 60 mg. 30 tablet 3  . isosorbide mononitrate (IMDUR) 30 MG 24 hr tablet Take 1 tablet (30 mg total) by mouth daily. (Patient not taking: Reported on 07/31/2019) 30 tablet 0   No current facility-administered medications for this visit.     Allergies:   Patient has no known allergies.    Social History:  The patient  reports that he has been smoking cigarettes. He has a 15.00 pack-year smoking history. He has never used smokeless tobacco. He reports that he does not drink alcohol or use drugs.   Family History:  The patient's family history includes Brain cancer in his mother; Heart disease in his father; Heart failure in his father; Leukemia in his mother; Sickle cell anemia in his mother and another family member.    ROS: All other systems are reviewed and negative. Unless otherwise mentioned in H&P    PHYSICAL EXAM: VS:  BP 113/65   Pulse 70   Ht 6' 3.5" (1.918 m)   Wt 187 lb (84.8 kg)   SpO2 93%   BMI 23.06 kg/m  , BMI Body mass index is 23.06 kg/m. GEN: Well nourished, well developed, in no acute distress HEENT: normal Neck: no JVD, carotid bruits, or masses  Cardiac: IRRR; no murmurs, rubs, or gallops 3+ bilateral pretibial shiny edema to his knees Respiratory:  Upper airway wheezes,  labored breathing GI: soft, nontender, mild distended, + BS MS: no deformity or atrophy Skin: warm and dry, no rash Neuro:  Strength and sensation are intact Psych: euthymic mood, full affect   EKG: Not completed during this office visit   Recent Labs: 06/22/2019: B Natriuretic Peptide 1,512.4 06/23/2019: ALT 38; Magnesium 2.0 07/29/2019: BUN 22; Creatinine, Ser 1.55; Hemoglobin 12.7; Platelets 191; Potassium 3.3; Sodium 139    Lipid Panel    Component Value Date/Time   CHOL 127 06/25/2018 1107   TRIG 117 06/25/2018 1107   HDL 30 (L) 06/25/2018 1107   CHOLHDL 4.2 06/25/2018 1107   CHOLHDL 3.8 10/24/2014 0943   VLDL 22 10/24/2014 0943   LDLCALC 74 06/25/2018 1107   LDLDIRECT 76.3 08/13/2010 0931      Wt Readings from Last 3 Encounters:  08/14/19 187 lb (84.8 kg)  Sep 01, 2019 172 lb (78 kg)  07/18/19 172 lb (78 kg)      Other studies Reviewed: Echocardiogram Sep 01, 2019 1. Left ventricular ejection fraction, by visual estimation, is 25 to 30%. The left ventricle has severely decreased function. There is no left ventricular hypertrophy.  2. Global right ventricle has moderately reduced systolic function.The right ventricular size is moderately enlarged.  3. Left atrial size was severely dilated.  4. Right atrial size was moderately dilated.  5. The mitral valve is grossly normal. Moderate mitral valve regurgitation.  6. The tricuspid valve is grossly normal. Tricuspid valve regurgitation is mild.  7. The aortic valve is tricuspid. Aortic valve regurgitation is mild. Mild aortic valve sclerosis without stenosis.  8. The pulmonic valve was grossly normal. Pulmonic valve regurgitation is mild.  9. Severe plaque invoving the descending aorta. 10. Global hypokinesis with akinesis of the inferior wall; overall severely reduced LV systolic function;  severe LAE; no LAA thrombus; moderate RAE; moderate RVE with moderate RV dysfuncttion; mild AI; moderate (3+) MR; mild TR and PI.   ASSESSMENT AND PLAN:  1.  Acute on chronic decompensated systolic heart failure: Most recent ejection fraction 25 to 30% per TEE on 06/08/2019.  He admits to eating salted foods to include  sausages, canned salmon, gravy and spiced meats.  He also eats a lot of cheese.   The patient is overtly fluid overloaded with 3+ pitting edema to his knees bilaterally, his weight is up 14 pounds, he is short of breath with symptoms of PND and orthopnea.  I believe that he should be hospitalized but he refuses to be put in the hospital at this time. He is willing to be tried on outpatient therapy first.  I will therefore stop his Lasix, and change him to torsemide 60 mg twice daily for 3 days, give him 1 dose of metolazone 2.5 mg x 1.  I will also increase his potassium to 40 mEq twice daily with torsemide dosing.  After 3 days he will continue torsemide at 40 mg twice daily.  May not respond to p.o. medications.    If he fails outpatient therapy he will need to be admitted for IV diuresis possible inotrope therapy in the setting of reduced EF.  If he is no better in 2 days I have advised him to report to the hospital where he will be admitted for further diuresis and treatment.  Today he will have a BNP and a BMET.  I will see him in 5 days for reevaluation in the office.  If he is not better then I will admit him.  He is aware of the need to go to the emergency room if he is not diuresing within the next 2 days or if symptoms worsen. He is not yet a candidate for afterload reduction. He will be seen on follow-up on Monday by Dr. [name] at 2:40 PM.  2.  Moderate mitral valve regurgitation.  Per TEE completed on 08/08/2019, 3+ MR.  He was not found to be a candidate for mitral valve clipping.   3.  Atrial fibrillation: Patient remains on metoprolol 100 mg daily along with apixaban 5 mg  twice daily.  4.  Hypercholesterolemia: Continue atorvastatin 80 mg daily.  Current medicines are reviewed at length with the patient today.  I have discussed this with Dr. [name] who is onsite today.  He also believes the patient should be hospitalized.  I have reiterated this to the patient who continues to refuse hospitalization.  Dr. [name] will see him on follow-up in 5 days.  Labs/ tests ordered today include: BMET, BNP  Phill Myron. [place] Pugh, ANP, AACC   08/14/2019 3:31 PM    [facility] [place] [address] Office [phone] Fax [phone]  Notice: This dictation was prepared with Dragon dictation along with smaller phrase technology. Any transcriptional errors that result from this process are unintentional and may not be corrected upon review.

## 2019-08-14 ENCOUNTER — Ambulatory Visit (INDEPENDENT_AMBULATORY_CARE_PROVIDER_SITE_OTHER): Payer: Medicare HMO | Admitting: Adult Health

## 2019-08-14 ENCOUNTER — Encounter: Payer: Self-pay | Admitting: Adult Health

## 2019-08-14 ENCOUNTER — Other Ambulatory Visit: Payer: Self-pay

## 2019-08-14 VITALS — BP 113/65 | HR 70 | Ht 75.5 in | Wt 187.0 lb

## 2019-08-14 DIAGNOSIS — Z79899 Other long term (current) drug therapy: Secondary | ICD-10-CM

## 2019-08-14 DIAGNOSIS — I255 Ischemic cardiomyopathy: Secondary | ICD-10-CM

## 2019-08-14 DIAGNOSIS — I340 Nonrheumatic mitral (valve) insufficiency: Secondary | ICD-10-CM

## 2019-08-14 DIAGNOSIS — I5021 Acute systolic (congestive) heart failure: Secondary | ICD-10-CM | POA: Diagnosis not present

## 2019-08-14 DIAGNOSIS — R0602 Shortness of breath: Secondary | ICD-10-CM | POA: Diagnosis not present

## 2019-08-14 MED ORDER — METOLAZONE 2.5 MG PO TABS: 2.5000 mg | ORAL_TABLET | Freq: Once | ORAL | 0 refills | Status: DC

## 2019-08-14 MED ORDER — TORSEMIDE 20 MG PO TABS: 40.0000 mg | ORAL_TABLET | Freq: Two times a day (BID) | ORAL | 3 refills | Status: DC

## 2019-08-14 NOTE — Patient Instructions (Addendum)
Medication Instructions:  STOP- Furosemide(Lasix) START- Torsemide take 60 mg by mouth twice a day for 3 days then 40 mg by mouth twice a day INCREASE- Potassium 40 mg by mouth twice a day for 3 days, then back to regular dose. TAKE- Metolazone 2.5 mg tomorrow morning 30 minutes before taking Torsemide.  If you need a refill on your cardiac medications before your next appointment, please call your pharmacy.  Labwork: BMP and BNP HERE IN OUR OFFICE AT [facility]   You will need to fast.    You will NOT need to fast   If you have labs (blood work) drawn today and your tests are completely normal, you will receive your results only by: Marland Kitchen MyChart Message (if you have MyChart) OR . A paper copy in the mail If you have any lab test that is abnormal or we need to change your treatment, we will call you to review the results.  Testing/Procedures: None Ordered   Follow-Up: Monday December 14th @ 2:40 pm  At [facility], you and your health needs are our priority.  As part of our continuing mission to provide you with exceptional heart care, we have created designated Provider Care Teams.  These Care Teams include your primary Cardiologist (physician) and Advanced Practice Providers (APPs -  Physician Assistants and Nurse Practitioners) who all work together to provide you with the care you need, when you need it.  Thank you for choosing [facility] at [facility]!!       Happy Holidays!!

## 2019-08-14 NOTE — Progress Notes (Signed)
The patient has been notified of the result during his office visit with Jory Sims, NP on 08/14/2019. Jacqulynn Cadet, [place] 08/14/2019 5:07 PM

## 2019-08-15 DIAGNOSIS — R0602 Shortness of breath: Secondary | ICD-10-CM | POA: Diagnosis not present

## 2019-08-15 DIAGNOSIS — Z79899 Other long term (current) drug therapy: Secondary | ICD-10-CM | POA: Diagnosis not present

## 2019-08-15 NOTE — Progress Notes (Deleted)
HPI: FU CHF, atrial fibrillation, CAD, previous V fib arrest and ischemic CM. Patient with a history of PCI of his LAD in October 2004. He was admitted in June of 2013 following a ventricular fibrillation arrest that was felt related to hypokalemia (K 2.2). Patient improved following that episode. Patient admitted in April of 2014 following a ventricular fibrillation arrest. Cardiac catheterization in April of 2014 showed a patent stent in the LAD with a 40% in-stent restenosis. There was an apical 80% lesion. The circumflex was occluded. The right coronary artery was small and nondominant. The patient had attempt at PCI of the occluded circumflex but was unsuccessful. Echocardiogram in April of 2014 showed an ejection fraction of 30-35%, mild left atrial enlargement, moderately reduced RV function and mild mitral regurgitation. Patient was seen by Dr. Lovena Le of electrophysiology and ICD felt not indicated at that time because the patient did suffer a non-ST elevation myocardial infarction with positive enzymes. He was discharged with a life vest. Nuclear study January 2017 showed fixed inferior and apical defect but no ischemia. Patient had angiogram for peripheral vascular disease in April 2017 that showed an occluded right external iliac and common femoral artery not amenable to percutaneous intervention and an 80% left common femoral artery stenosis and an occluded left SFA also not amenable to percutaneous intervention. Treated medically. Abdominal ultrasound 7/20 showed 3 cm abdominal aortic aneurysm; occluded right common iliac and external iliac, left distal external iliac with greater than 50% stenosis; follow-up recommended 24 months. Carotid Dopplers December 2020 showed 40 to 59% right and left stenosis.  Transesophageal echocardiogram December 2020 showed ejection fraction 25 to 30%, biatrial enlargement, reduced RV function, moderate mitral regurgitation (3+), mild tricuspid regurgitation,  mild aortic insufficiency.  Patient seen recently with progressive congestive heart failure.  Diuretics were increased.  Note at that time admission was recommended but patient declined.  Since last seen,  Current Outpatient Medications  Medication Sig Dispense Refill   albuterol (VENTOLIN HFA) 108 (90 Base) MCG/ACT inhaler Inhale 2 puffs into the lungs every 6 (six) hours as needed for wheezing or shortness of breath. 90 g 0   apixaban (ELIQUIS) 5 MG TABS tablet Take 1 tablet (5 mg total) by mouth 2 (two) times daily. 60 tablet 6   atorvastatin (LIPITOR) 80 MG tablet TAKE 1 TABLET BY MOUTH EVERY DAY (Patient taking differently: Take 80 mg by mouth every evening. ) 90 tablet 2   isosorbide mononitrate (IMDUR) 30 MG 24 hr tablet Take 1 tablet (30 mg total) by mouth daily. (Patient not taking: Reported on 07/31/2019) 30 tablet 0   metolazone (ZAROXOLYN) 2.5 MG tablet Take 1 tablet (2.5 mg total) by mouth once for 1 dose. 1 tablet 0   metoprolol succinate (TOPROL-XL) 100 MG 24 hr tablet Take 1 tablet (100 mg total) by mouth daily. Please keep upcoming appt for further refills. Thank you (Patient taking differently: Take 100 mg by mouth every evening. Please keep upcoming appt for further refills. Thank you) 30 tablet 0   nitroGLYCERIN (NITROSTAT) 0.4 MG SL tablet Place 1 tablet (0.4 mg total) under the tongue every 5 (five) minutes as needed for chest pain (Up to 3 doses). 25 tablet 3   potassium chloride SA (KLOR-CON M20) 20 MEQ tablet Take 1 tablet (20 mEq total) by mouth 2 (two) times daily for 30 doses. Take on days when taking lasix 60 mg. 30 tablet 3   torsemide (DEMADEX) 20 MG tablet Take 2 tablets (40  mg total) by mouth 2 (two) times daily. 360 tablet 3   No current facility-administered medications for this visit.     Past Medical History:  Diagnosis Date   AAA (abdominal aortic aneurysm) ([place])    a. 08/2015 Abd U/S: 2.7 x 2.9 x 3.2 cm infrarenal AAA.   Bradycardia    a.  Requiring discontinuation of use of BB/CCB   Cardiac arrest - ventricular fibrillation    a. 03/2012 in setting of hypokalemia (prolonged hosp with VDRF, tracheobronchitis, ARF, shock liver, PAF, AMS felt secondary to post-anoxic encephalopathy/shock).   Carotid artery stenosis    a. 01/2011 - 40-59% bilateral stenosis;  b. 06/2015 Carotid U/S: 40-59% bilat ICA stenosis->f/u 6 mos.   CKD (chronic kidney disease), stage III    Coronary artery disease    a. s/p aborted ant STEMI tx with Cypher DES to LAD 10/04 (residual at cath: D1 50%, CFX 40% and multiple dist 70%, EF 55%);   b. myoview 3/10: Ef 47%, infero-apical isch, LOW RISK - med Tx recommended;  c. 12/2012 VF Arrest/Cath: LAD 40isr, 80 apical, LCX 100 (failed PTCA), RCA nondom.   Diabetes mellitus    Diverticulosis 2001   Elevated LFTs    Shock liver 03/2012   H/O: CVA (cardiovascular accident)    Hematemesis    a. 12/2010 felt 2/2 Mallory Weiss tear - pt could not afford colonscopy/EGD at that time so Coumadin was deferred. Coumadin initiated 03/2012 without any evidence for bleeding.   History of Ischemic Cardiomyopathy    a. EF 50-55% by echo 03/09/12 (was 30% by echo 02/24/12); b. 03/2013 Echo: EF 55-60%, mild MR, sev dil LA, mod dil RA, PASP 60mmHg.   Hypertensive heart disease    a. 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 Mild by echo 03/2012 and 03/2013.   Mixed Hyperlipidemia    Peripheral vascular disease ([place])    a. h/o LE angioplasty;  b. 08/2015 Duplex: >50% RCIA, 100% REIA, >50% LEIA, elev vel in SMA, patent IVC.   Persistent atrial fibrillation ([place])    a. Dx 12/2010-->coumadin (CHA2DS2VASc = 7).   QT prolongation     Past Surgical History:  Procedure Laterality Date   HERNIA REPAIR     LEFT HEART CATH N/A 12/30/2012   Procedure: LEFT HEART  CATH;  Surgeon: [name], MD;  Location: [facility] CATH LAB;  Service: Cardiovascular;  Laterality: N/A;   ORIF TIBIA & FIBULA FRACTURES  01/11/07   OPEN TX OF UNICONDYLAR PLATEAU FRACTURE, IRRIGATION/DEBRIDEMENT OF OPEN FRACTURE INCLUDING BONE, REMOVAL OF EXTERNAL FIXATOR UNDER ANESTHESIA PER DR. MICHAEL HANDY   PERIPHERAL VASCULAR CATHETERIZATION N/A 12/21/2015   Procedure: Lower Extremity Angiography;  Surgeon: [name], MD;  Location: [place] CV LAB;  Service: Cardiovascular;  Laterality: N/A;   POPLITEAL ARTERY ANGIOPLASTY  01/07/07   s/p LEFT POPLITEAL ARTERY EXPLORATION AND VEIN PATCH ANGIOPLASTY, PER DR. EARLY, SECONDARY TO ISCHEMIC LEFT FOOT RELATED TO LEFT POPLITEAL ARTERY INJURY   SKIN GRAFT     TEE WITHOUT CARDIOVERSION N/A 08/08/2019   Procedure: TRANSESOPHAGEAL ECHOCARDIOGRAM (TEE);  Surgeon: Lelon Perla, MD;  Location: [facility] ENDOSCOPY;  Service: Cardiovascular;  Laterality: N/A;    Social History   Socioeconomic History   Marital status: Married    Spouse name: Not on file   Number of children:  4   Years of education: Not on file   Highest education level: Not on file  Occupational History   Occupation: [employer]    Employer: RETIRED  Tobacco Use   Smoking status: Current Some Day Smoker    Packs/day: 0.50    Years: 30.00    Pack years: 15.00    Types: Cigarettes    Last attempt to quit: 03/12/2012    Years since quitting: 7.4   Smokeless tobacco: Never Used  Substance and Sexual Activity   Alcohol use: No    Alcohol/week: 0.0 standard drinks   Drug use: No    Frequency: 2.0 times per week   Sexual activity: Not on file  Other Topics Concern   Not on file  Social History Narrative   ** Merged History Encounter **       MARRIED   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 YES. 1/2 -1 PPD OF CIGARETTES    NO ETOH   Social Determinants of Health   Financial Resource Strain:    Difficulty of  Paying Living Expenses: Not on file  Food Insecurity:    Worried About Charity fundraiser in the Last Year: Not on file   [employer] of Food in the Last Year: Not on file  Transportation Needs:    Lack of Transportation (Medical): Not on file   Lack of Transportation (Non-Medical): Not on file  Physical Activity:    Days of Exercise per Week: Not on file   Minutes of Exercise per Session: Not on file  Stress:    Feeling of Stress : Not on file  Social Connections:    Frequency of Communication with Friends and Family: Not on file   Frequency of Social Gatherings with Friends and Family: Not on file   Attends Religious Services: Not on file   Active Member of Clubs or Organizations: Not on file   Attends Archivist Meetings: Not on file   Marital Status: Not on file  Intimate Partner Violence:    Fear of Current or Ex-Partner: Not on file   Emotionally Abused: Not on file   Physically Abused: Not on file   Sexually Abused: Not on file    Family History  Problem Relation Age of Onset   Heart disease Father        ALSO UNCLE DIED HAD CAD   Heart failure Father    Brain cancer Mother    Leukemia Mother    Sickle cell anemia Mother    Sickle cell anemia Other     ROS: no fevers or chills, productive cough, hemoptysis, dysphasia, odynophagia, melena, hematochezia, dysuria, hematuria, rash, seizure activity, orthopnea, PND, pedal edema, claudication. Remaining systems are negative.  Physical Exam: Well-developed well-nourished in no acute distress.  Skin is warm and dry.  HEENT is normal.  Neck is supple.  Chest is clear to auscultation with normal expansion.  Cardiovascular exam is regular rate and rhythm.  Abdominal exam nontender or distended. No masses palpated. Extremities show no edema. neuro grossly intact  ECG- personally reviewed  A/P  1 acute on chronic systolic congestive heart failure-  2 permanent atrial  fibrillation-continue beta-blocker at present dose for rate control.  Continue apixaban.  3 coronary artery disease-patient has not had recurrent chest pain.  Continue statin.  No aspirin given need for apixaban.  4 ischemic cardiomyopathy-  5 carotid artery disease-plan follow-up carotid Dopplers December 2021.  6  abdominal aortic aneurysm/peripheral vascular disease-plan follow-up ultrasound July 2022.  7 chronic stage III kidney disease-check potassium and renal function.  8 tobacco abuse-patient counseled on discontinuing.  9 mitral regurgitation-this appeared to be moderate or 3+ on recent transesophageal echocardiogram.  Patient will need follow-up echoes in the future.  Hopefully mitral regurgitation will improve with CHF therapy.  10 hyperlipidemia-continue statin.   Kirk Ruths, MD

## 2019-08-16 LAB — BASIC METABOLIC PANEL
BUN/Creatinine Ratio: 18 (ref 10–24)
BUN: 33 mg/dL — ABNORMAL HIGH (ref 8–27)
CO2: 24 mmol/L (ref 20–29)
Calcium: 8.9 mg/dL (ref 8.6–10.2)
Chloride: 98 mmol/L (ref 96–106)
Creatinine, Ser: 1.84 mg/dL — ABNORMAL HIGH (ref 0.76–1.27)
GFR calc Af Amer: 40 mL/min/{1.73_m2} — ABNORMAL LOW (ref >59)
GFR calc non Af Amer: 35 mL/min/{1.73_m2} — ABNORMAL LOW (ref >59)
Glucose: 97 mg/dL (ref 65–99)
Potassium: 3.1 mmol/L — ABNORMAL LOW (ref 3.5–5.2)
Sodium: 140 mmol/L (ref 134–144)

## 2019-08-16 LAB — PRO B NATRIURETIC PEPTIDE: NT-Pro BNP: 16511 pg/mL — ABNORMAL HIGH (ref 0–486)

## 2019-08-19 ENCOUNTER — Ambulatory Visit: Payer: Medicare HMO | Admitting: Cardiology

## 2019-08-19 ENCOUNTER — Telehealth: Payer: Self-pay | Admitting: *Deleted

## 2019-08-19 ENCOUNTER — Other Ambulatory Visit: Payer: Self-pay | Admitting: *Deleted

## 2019-08-19 DIAGNOSIS — I5021 Acute systolic (congestive) heart failure: Secondary | ICD-10-CM

## 2019-08-19 NOTE — Telephone Encounter (Signed)
Spoke with pt, he did not have today's appointment down he has next Monday. He reports all of his swelling is gone and his breathing is better. He reports he is back to normal. He will keep the follow up appointment 08/26/2019 and will call prior to appointment with problems.

## 2019-08-20 NOTE — Progress Notes (Signed)
HPI: FU CHF, atrial fibrillation, CAD, previous V fib arrest and ischemic CM. Patient with a history of PCI of his LAD in October 2004. He was admitted in June of 2013 following a ventricular fibrillation arrest that was felt related to hypokalemia (K 2.2). Patient improved following that episode. Patient admitted in April of 2014 following a ventricular fibrillation arrest. Cardiac catheterization in April of 2014 showed a patent stent in the LAD with a 40% in-stent restenosis. There was an apical 80% lesion. The circumflex was occluded. The right coronary artery was small and nondominant. The patient had attempt at PCI of the occluded circumflex but was unsuccessful. Echocardiogram in April of 2014 showed an ejection fraction of 30-35%, mild left atrial enlargement, moderately reduced RV function and mild mitral regurgitation. Patient was seen by Dr. Lovena Le of electrophysiology and ICD felt not indicated at that time because the patient did suffer a non-ST elevation myocardial infarction with positive enzymes. He was discharged with a life vest. Nuclear study January 2017 showed fixed inferior and apical defect but no ischemia. Patient had angiogram for peripheral vascular disease in April 2017 that showed an occluded right external iliac and common femoral artery not amenable to percutaneous intervention and an 80% left common femoral artery stenosis and an occluded left SFA also not amenable to percutaneous intervention. Treated medically. Abdominal ultrasound 7/20 showed 3 cm abdominal aortic aneurysm; occluded right common iliac and external iliac, left distal external iliac with greater than 50% stenosis; follow-up recommended 24 months. Carotid Dopplers December 2020 showed 40 to 59% right and left stenosis.  Transesophageal echocardiogram December 2020 showed ejection fraction 25 to 30%, biatrial enlargement, reduced RV function, moderate mitral regurgitation (3+), mild tricuspid regurgitation,  mild aortic insufficiency.  Patient seen recently with progressive congestive heart failure.  Diuretics were increased.  Note at that time admission was recommended but patient declined.  Since last seen,he has dyspnea on exertion which she states is improved with an inhaler.  His pedal edema has improved but mild edema persists.  No chest pain, palpitations or syncope.  Current Outpatient Medications  Medication Sig Dispense Refill   albuterol (VENTOLIN HFA) 108 (90 Base) MCG/ACT inhaler Inhale 2 puffs into the lungs every 6 (six) hours as needed for wheezing or shortness of breath. 90 g 0   apixaban (ELIQUIS) 5 MG TABS tablet Take 1 tablet (5 mg total) by mouth 2 (two) times daily. 60 tablet 6   atorvastatin (LIPITOR) 80 MG tablet TAKE 1 TABLET BY MOUTH EVERY DAY (Patient taking differently: Take 80 mg by mouth every evening. ) 90 tablet 2   isosorbide mononitrate (IMDUR) 30 MG 24 hr tablet Take 1 tablet (30 mg total) by mouth daily. 30 tablet 0   metoprolol succinate (TOPROL-XL) 100 MG 24 hr tablet Take 1 tablet (100 mg total) by mouth daily. 30 tablet 0   nitroGLYCERIN (NITROSTAT) 0.4 MG SL tablet Place 1 tablet (0.4 mg total) under the tongue every 5 (five) minutes as needed for chest pain (Up to 3 doses). 25 tablet 3   potassium chloride SA (KLOR-CON M20) 20 MEQ tablet Take 1 tablet (20 mEq total) by mouth 2 (two) times daily for 30 doses. Take on days when taking lasix 60 mg. 30 tablet 3   torsemide (DEMADEX) 20 MG tablet Take 2 tablets (40 mg total) by mouth 2 (two) times daily. 360 tablet 3   metolazone (ZAROXOLYN) 2.5 MG tablet Take 1 tablet (2.5 mg total) by mouth once  for 1 dose. 1 tablet 0   No current facility-administered medications for this visit.     Past Medical History:  Diagnosis Date   AAA (abdominal aortic aneurysm) ([place])    a. 08/2015 Abd U/S: 2.7 x 2.9 x 3.2 cm infrarenal AAA.   Bradycardia    a. Requiring discontinuation of use of BB/CCB   Cardiac arrest  - ventricular fibrillation    a. 03/2012 in setting of hypokalemia (prolonged hosp with VDRF, tracheobronchitis, ARF, shock liver, PAF, AMS felt secondary to post-anoxic encephalopathy/shock).   Carotid artery stenosis    a. 01/2011 - 40-59% bilateral stenosis;  b. 06/2015 Carotid U/S: 40-59% bilat ICA stenosis->f/u 6 mos.   CKD (chronic kidney disease), stage III    Coronary artery disease    a. s/p aborted ant STEMI tx with Cypher DES to LAD 10/04 (residual at cath: D1 50%, CFX 40% and multiple dist 70%, EF 55%);   b. myoview 3/10: Ef 47%, infero-apical isch, LOW RISK - med Tx recommended;  c. 12/2012 VF Arrest/Cath: LAD 40isr, 80 apical, LCX 100 (failed PTCA), RCA nondom.   Diabetes mellitus    Diverticulosis 2001   Elevated LFTs    Shock liver 03/2012   H/O: CVA (cardiovascular accident)    Hematemesis    a. 12/2010 felt 2/2 Mallory Weiss tear - pt could not afford colonscopy/EGD at that time so Coumadin was deferred. Coumadin initiated 03/2012 without any evidence for bleeding.   History of Ischemic Cardiomyopathy    a. EF 50-55% by echo 03/09/12 (was 30% by echo 02/24/12); b. 03/2013 Echo: EF 55-60%, mild MR, sev dil LA, mod dil RA, PASP 77mmHg.   Hypertensive heart disease    a. 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 Mild by echo 03/2012 and 03/2013.   Mixed Hyperlipidemia    Peripheral vascular disease ([place])    a. h/o LE angioplasty;  b. 08/2015 Duplex: >50% RCIA, 100% REIA, >50% LEIA, elev vel in SMA, patent IVC.   Persistent atrial fibrillation ([place])    a. Dx 12/2010-->coumadin (CHA2DS2VASc = 7).   QT prolongation     Past Surgical History:  Procedure Laterality Date   HERNIA REPAIR     LEFT HEART CATH N/A 12/30/2012   Procedure: LEFT HEART CATH;  Surgeon: [name], MD;  Location: [facility] CATH LAB;   Service: Cardiovascular;  Laterality: N/A;   ORIF TIBIA & FIBULA FRACTURES  01/11/07   OPEN TX OF UNICONDYLAR PLATEAU FRACTURE, IRRIGATION/DEBRIDEMENT OF OPEN FRACTURE INCLUDING BONE, REMOVAL OF EXTERNAL FIXATOR UNDER ANESTHESIA PER DR. MICHAEL HANDY   PERIPHERAL VASCULAR CATHETERIZATION N/A 12/21/2015   Procedure: Lower Extremity Angiography;  Surgeon: [name], MD;  Location: [place] CV LAB;  Service: Cardiovascular;  Laterality: N/A;   POPLITEAL ARTERY ANGIOPLASTY  01/07/07   s/p LEFT POPLITEAL ARTERY EXPLORATION AND VEIN PATCH ANGIOPLASTY, PER DR. EARLY, SECONDARY TO ISCHEMIC LEFT FOOT RELATED TO LEFT POPLITEAL ARTERY INJURY   SKIN GRAFT     TEE WITHOUT CARDIOVERSION N/A 08/08/2019   Procedure: TRANSESOPHAGEAL ECHOCARDIOGRAM (TEE);  Surgeon: Lelon Perla, MD;  Location: [facility] ENDOSCOPY;  Service: Cardiovascular;  Laterality: N/A;    Social History   Socioeconomic History   Marital status: Married    Spouse name: Not on file   Number of children: 4   Years of  education: Not on file   Highest education level: Not on file  Occupational History   Occupation: [place]    Employer: RETIRED  Tobacco Use   Smoking status: Current Some Day Smoker    Packs/day: 0.50    Years: 30.00    Pack years: 15.00    Types: Cigarettes    Last attempt to quit: 03/12/2012    Years since quitting: 7.4   Smokeless tobacco: Never Used  Substance and Sexual Activity   Alcohol use: No    Alcohol/week: 0.0 standard drinks   Drug use: No    Frequency: 2.0 times per week   Sexual activity: Not on file  Other Topics Concern   Not on file  Social History Narrative   ** Merged History Encounter **       MARRIED   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 YES. 1/2 -1 PPD OF CIGARETTES    NO ETOH   Social Determinants of Health   Financial Resource Strain:    Difficulty of Paying Living Expenses: Not on file  Food Insecurity:     Worried About Charity fundraiser in the Last Year: Not on file   [employer] of Food in the Last Year: Not on file  Transportation Needs:    Lack of Transportation (Medical): Not on file   Lack of Transportation (Non-Medical): Not on file  Physical Activity:    Days of Exercise per Week: Not on file   Minutes of Exercise per Session: Not on file  Stress:    Feeling of Stress : Not on file  Social Connections:    Frequency of Communication with Friends and Family: Not on file   Frequency of Social Gatherings with Friends and Family: Not on file   Attends Religious Services: Not on file   Active Member of Clubs or Organizations: Not on file   Attends Archivist Meetings: Not on file   Marital Status: Not on file  Intimate Partner Violence:    Fear of Current or Ex-Partner: Not on file   Emotionally Abused: Not on file   Physically Abused: Not on file   Sexually Abused: Not on file    Family History  Problem Relation Age of Onset   Heart disease Father        ALSO UNCLE DIED HAD CAD   Heart failure Father    Brain cancer Mother    Leukemia Mother    Sickle cell anemia Mother    Sickle cell anemia Other     ROS: Bilateral leg burning but no fevers or chills, productive cough, hemoptysis, dysphasia, odynophagia, melena, hematochezia, dysuria, hematuria, rash, seizure activity, orthopnea, PND, claudication. Remaining systems are negative.  Physical Exam: Well-developed well-nourished in no acute distress.  Skin is warm and dry.  HEENT is normal.  Neck is supple.  Chest with mild rhonchi and diminished breath sounds Cardiovascular exam is regular rate and rhythm.  2/6 systolic murmur apex Abdominal exam nontender or distended. No masses palpated. Extremities show 1+ edema. neuro grossly intact   A/P  1 acute on chronic systolic congestive heart failure-patient's volume status has much improved following recent addition of Demadex.  I will  continue at present dose.  Check potassium and renal function.  We need to follow his potassium closely as he had a cardiac arrest in the past in the setting of hypokalemia.  We discussed fluid restriction and low-sodium diet.  2  permanent atrial fibrillation-continue beta-blocker at present dose for rate control.  Continue apixaban.  3 coronary artery disease-patient has not had recurrent chest pain.  Continue statin.  No aspirin given need for apixaban.  4 ischemic cardiomyopathy-continue beta-blocker.  We will await results of bmet.  If renal function stable I will add low-dose ARB.  Not clear to me that his blood pressure will tolerate Entresto.  If renal function worse will add low-dose hydralazine to his nitrates.  5 carotid artery disease-plan follow-up carotid Dopplers December 2021.  6 abdominal aortic aneurysm/peripheral vascular disease-plan follow-up ultrasound July 2022.  7 chronic stage III kidney disease-check potassium and renal function.  8 tobacco abuse-patient counseled on discontinuing.  9 mitral regurgitation-this appeared to be moderate or 3+ on recent transesophageal echocardiogram.  Patient will need follow-up echoes in the future.  Hopefully mitral regurgitation will improve with CHF therapy.  If not may need to consider referral for MitraClip.  10 hyperlipidemia-continue statin.   Kirk Ruths, MD

## 2019-08-23 ENCOUNTER — Other Ambulatory Visit: Payer: Self-pay | Admitting: Cardiology

## 2019-08-26 ENCOUNTER — Other Ambulatory Visit: Payer: Self-pay

## 2019-08-26 ENCOUNTER — Encounter: Payer: Self-pay | Admitting: Cardiology

## 2019-08-26 ENCOUNTER — Ambulatory Visit (INDEPENDENT_AMBULATORY_CARE_PROVIDER_SITE_OTHER): Payer: Medicare HMO | Admitting: Cardiology

## 2019-08-26 VITALS — BP 116/64 | HR 58 | Temp 95.5°F | Ht 75.0 in | Wt 180.2 lb

## 2019-08-26 DIAGNOSIS — I5021 Acute systolic (congestive) heart failure: Secondary | ICD-10-CM

## 2019-08-26 DIAGNOSIS — I4821 Permanent atrial fibrillation: Secondary | ICD-10-CM

## 2019-08-26 DIAGNOSIS — I2510 Atherosclerotic heart disease of native coronary artery without angina pectoris: Secondary | ICD-10-CM | POA: Diagnosis not present

## 2019-08-26 DIAGNOSIS — I340 Nonrheumatic mitral (valve) insufficiency: Secondary | ICD-10-CM | POA: Diagnosis not present

## 2019-08-26 DIAGNOSIS — I255 Ischemic cardiomyopathy: Secondary | ICD-10-CM | POA: Diagnosis not present

## 2019-08-26 MED ORDER — ALBUTEROL SULFATE HFA 108 (90 BASE) MCG/ACT IN AERS: 2.0000 | INHALATION_SPRAY | Freq: Four times a day (QID) | RESPIRATORY_TRACT | 3 refills | Status: AC | PRN

## 2019-08-26 NOTE — Patient Instructions (Signed)
Medication Instructions:  REFILLED ALBUTEROL INHALER NO CHANGES *If you need a refill on your cardiac medications before your next appointment, please call your pharmacy*  Lab Work: Your physician recommends that you return for lab work today Artist)  If you have labs (blood work) drawn today and your tests are completely normal, you will receive your results only by: Marland Kitchen MyChart Message (if you have MyChart) OR . A paper copy in the mail If you have any lab test that is abnormal or we need to change your treatment, we will call you to review the results.  Testing/Procedures: NONE NEEDED  Follow-Up: At [facility], you and your health needs are our priority.  As part of our continuing mission to provide you with exceptional heart care, we have created designated Provider Care Teams.  These Care Teams include your primary Cardiologist (physician) and Advanced Practice Providers (APPs -  Physician Assistants and Nurse Practitioners) who all work together to provide you with the care you need, when you need it.  Your physician recommends that you schedule a follow-up appointment in: 4 weeks with APP and 3 months with Dr. [name].  Other Instructions Call [phone] to establish a primary care physician.

## 2019-08-27 ENCOUNTER — Telehealth: Payer: Self-pay | Admitting: *Deleted

## 2019-08-27 LAB — BASIC METABOLIC PANEL
BUN/Creatinine Ratio: 18 (ref 10–24)
BUN: 35 mg/dL — ABNORMAL HIGH (ref 8–27)
CO2: 24 mmol/L (ref 20–29)
Calcium: 9.2 mg/dL (ref 8.6–10.2)
Chloride: 97 mmol/L (ref 96–106)
Creatinine, Ser: 1.96 mg/dL — ABNORMAL HIGH (ref 0.76–1.27)
GFR calc Af Amer: 37 mL/min/{1.73_m2} — ABNORMAL LOW (ref >59)
GFR calc non Af Amer: 32 mL/min/{1.73_m2} — ABNORMAL LOW (ref >59)
Glucose: 94 mg/dL (ref 65–99)
Potassium: 4 mmol/L (ref 3.5–5.2)
Sodium: 140 mmol/L (ref 134–144)

## 2019-08-27 NOTE — Telephone Encounter (Addendum)
Left message for pt to call    ----- Message from Lelon Perla, MD sent at 08/27/2019  8:23 AM EST ----- Add losartan 25 mg daily; bmet one week Kirk Ruths

## 2019-08-29 NOTE — Telephone Encounter (Signed)
Left message for pt to call.

## 2019-09-12 ENCOUNTER — Encounter: Payer: Self-pay | Admitting: *Deleted

## 2019-09-23 ENCOUNTER — Encounter: Payer: Self-pay | Admitting: Physician Assistant

## 2019-09-23 ENCOUNTER — Ambulatory Visit (INDEPENDENT_AMBULATORY_CARE_PROVIDER_SITE_OTHER): Payer: Medicare HMO | Admitting: Family Medicine

## 2019-09-23 ENCOUNTER — Other Ambulatory Visit: Payer: Self-pay

## 2019-09-23 VITALS — BP 113/69 | HR 102 | Ht 75.5 in | Wt 178.2 lb

## 2019-09-23 DIAGNOSIS — F17200 Nicotine dependence, unspecified, uncomplicated: Secondary | ICD-10-CM

## 2019-09-23 DIAGNOSIS — I714 Abdominal aortic aneurysm, without rupture, unspecified: Secondary | ICD-10-CM

## 2019-09-23 DIAGNOSIS — I4821 Permanent atrial fibrillation: Secondary | ICD-10-CM | POA: Diagnosis not present

## 2019-09-23 DIAGNOSIS — R0600 Dyspnea, unspecified: Secondary | ICD-10-CM

## 2019-09-23 DIAGNOSIS — I6523 Occlusion and stenosis of bilateral carotid arteries: Secondary | ICD-10-CM | POA: Diagnosis not present

## 2019-09-23 DIAGNOSIS — I255 Ischemic cardiomyopathy: Secondary | ICD-10-CM | POA: Diagnosis not present

## 2019-09-23 DIAGNOSIS — I5022 Chronic systolic (congestive) heart failure: Secondary | ICD-10-CM

## 2019-09-23 DIAGNOSIS — E782 Mixed hyperlipidemia: Secondary | ICD-10-CM

## 2019-09-23 DIAGNOSIS — I2510 Atherosclerotic heart disease of native coronary artery without angina pectoris: Secondary | ICD-10-CM | POA: Diagnosis not present

## 2019-09-23 DIAGNOSIS — N183 Chronic kidney disease, stage 3 unspecified: Secondary | ICD-10-CM | POA: Diagnosis not present

## 2019-09-23 DIAGNOSIS — I340 Nonrheumatic mitral (valve) insufficiency: Secondary | ICD-10-CM

## 2019-09-23 LAB — BASIC METABOLIC PANEL
BUN/Creatinine Ratio: 21 (ref 10–24)
BUN: 39 mg/dL — ABNORMAL HIGH (ref 8–27)
CO2: 23 mmol/L (ref 20–29)
Calcium: 9.5 mg/dL (ref 8.6–10.2)
Chloride: 101 mmol/L (ref 96–106)
Creatinine, Ser: 1.83 mg/dL — ABNORMAL HIGH (ref 0.76–1.27)
GFR calc Af Amer: 41 mL/min/{1.73_m2} — ABNORMAL LOW (ref >59)
GFR calc non Af Amer: 35 mL/min/{1.73_m2} — ABNORMAL LOW (ref >59)
Glucose: 79 mg/dL (ref 65–99)
Potassium: 4.5 mmol/L (ref 3.5–5.2)
Sodium: 140 mmol/L (ref 134–144)

## 2019-09-23 MED ORDER — HYDRALAZINE HCL 10 MG PO TABS: 10.0000 mg | ORAL_TABLET | Freq: Two times a day (BID) | ORAL | 0 refills | Status: DC

## 2019-09-23 NOTE — Progress Notes (Addendum)
Cardiology Office Note  Date: 09/23/2019   ID: Logan White, Logan White Apr 26, 1941, MRN [id number]  PCP:  Kelton Pillar, MD  Cardiologist:  Kirk Ruths, MD Electrophysiologist:  None   Chief Complaint  Patient presents with  . Follow-up    History of Present Illness: Logan White is a 79 y.o. male recently seen by Dr. [name] 08/26/2019 history of CHF, atrial fibrillation, CAD, previous V. fib arrest and ischemic cardiomyopathy.  Previous PCI of LAD in October 2004.  Admitted June 2013 on V. fib arrest related to hyperkalemia.  Admitted in April 2014 with V. fib arrest.  Cardiac catheterization April 2014 showed patent LAD stent with a 40% in-stent restenosis.  There was an 80% apical lesion.  The circumflex was occluded.  Right coronary was small and nondominant.  The patient had attempt at PCI of the occluded circumflex was unsuccessful.  Echocardiogram April 2014 showed EF of 30 to 35%.  Mild atrial enlargement.  Moderately reduced RV function and mild mitral regurgitation.  Seen by Dr. Lovena Le EP and ICD was not indicated at that time because the patient did suffer a non-ST MI with positive enzymes.  He was discharged with a LifeVest.  Stress nuclear study in January 2017 showed fixed inferior and apical defect but no ischemia.  Patient had angiogram for PVD April 2017 showed cumulative right internal iliac and common femoral artery not amenable to PCI and 80% left common femoral artery stenosis and occluded left SFA also not amenable to PCI.  Patient was treated medically.  Abdominal ultrasound to 2020 showed a 3 cm abdominal aortic aneurysm.  Occluded right common iliac and external iliac, left distal external iliac with greater than 50% stenosis.  Follow-up was recommended for 24 months.  Carotid Dopplers in December 2020 showed 40 to 59% right and left ICA stenosis.  TEE December 2020 showed ejection fraction of 25 to 30%.  Seen recently with progressive congestive heart failure.   Diuretics were increased.  Admission was recommended at that time time but patient was not agreeable.  He continues with dyspnea on exertion improved with inhaler use.  Pedal edema was improved but mild edema persisted..  Patient presents today with increasing shortness of breath occurring with and without exertion.  States he has been using a rescue albuterol inhaler 3-4 times a day prescribed by primary care provider recently which seems to provide temporary relief. He has significant history of smoking and likely COPD.    He denies any  exposure to Covid virus.  He does have a nonproductive loose cough on arrival today and expiratory wheezing on arrival today.   States his feet feel cold all the time.  He denies any claudication-like symptoms such as calf or thigh pain when walking.    Past Medical History:  Diagnosis Date  . AAA (abdominal aortic aneurysm) ([place])    a. 08/2015 Abd U/S: 2.7 x 2.9 x 3.2 cm infrarenal AAA.  . Bradycardia    a. Requiring discontinuation of use of BB/CCB  . Cardiac arrest - ventricular fibrillation    a. 03/2012 in setting of hypokalemia (prolonged hosp with VDRF, tracheobronchitis, ARF, shock liver, PAF, AMS felt secondary to post-anoxic encephalopathy/shock).  . Carotid artery stenosis    a. 01/2011 - 40-59% bilateral stenosis;  b. 06/2015 Carotid U/S: 40-59% bilat ICA stenosis->f/u 6 mos.  . CKD (chronic kidney disease), stage III   . Coronary artery disease    a. s/p aborted ant STEMI tx with Cypher DES  to LAD 10/04 (residual at cath: D1 50%, CFX 40% and multiple dist 70%, EF 55%);   b. myoview 3/10: Ef 47%, infero-apical isch, LOW RISK - med Tx recommended;  c. 12/2012 VF Arrest/Cath: LAD 40isr, 80 apical, LCX 100 (failed PTCA), RCA nondom.  . Diabetes mellitus   . Diverticulosis 2001  . Elevated LFTs    Shock liver 03/2012  . H/O: CVA (cardiovascular accident)   . Hematemesis    a. 12/2010 felt 2/2 Mallory Weiss tear - pt could not afford colonscopy/EGD  at that time so Coumadin was deferred. Coumadin initiated 03/2012 without any evidence for bleeding.  Marland Kitchen History of Ischemic Cardiomyopathy    a. EF 50-55% by echo 03/09/12 (was 30% by echo 02/24/12); b. 03/2013 Echo: EF 55-60%, mild MR, sev dil LA, mod dil RA, PASP 56mmHg.  Marland Kitchen Hypertensive heart disease    a. echo 4/12: EF 50%, asymmetric septal hypertrophy, no SAM or LVOT gradient, LAE, PASP 35  . Inguinal hernia   . 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 Mitral regurgitation    a. Mild by echo 03/2012 and 03/2013.  . Mixed Hyperlipidemia   . Peripheral vascular disease ([place])    a. h/o LE angioplasty;  b. 08/2015 Duplex: >50% RCIA, 100% REIA, >50% LEIA, elev vel in SMA, patent IVC.  Marland Kitchen Persistent atrial fibrillation ([place])    a. Dx 12/2010-->coumadin (CHA2DS2VASc = 7).  . QT prolongation     Past Surgical History:  Procedure Laterality Date  . HERNIA REPAIR    . LEFT HEART CATH N/A 12/30/2012   Procedure: LEFT HEART CATH;  Surgeon: [name], MD;  Location: [facility] CATH LAB;  Service: Cardiovascular;  Laterality: N/A;  . ORIF TIBIA & FIBULA FRACTURES  01/11/07   OPEN TX OF UNICONDYLAR PLATEAU FRACTURE, IRRIGATION/DEBRIDEMENT OF OPEN FRACTURE INCLUDING BONE, REMOVAL OF EXTERNAL FIXATOR UNDER ANESTHESIA PER DR. MICHAEL HANDY  . PERIPHERAL VASCULAR CATHETERIZATION N/A 12/21/2015   Procedure: Lower Extremity Angiography;  Surgeon: [name], MD;  Location: [facility] CV LAB;  Service: Cardiovascular;  Laterality: N/A;  . POPLITEAL ARTERY ANGIOPLASTY  01/07/07   s/p LEFT POPLITEAL ARTERY EXPLORATION AND VEIN PATCH ANGIOPLASTY, PER DR. EARLY, SECONDARY TO ISCHEMIC LEFT FOOT RELATED TO LEFT POPLITEAL ARTERY INJURY  . SKIN GRAFT    . TEE WITHOUT CARDIOVERSION N/A 08/08/2019   Procedure: TRANSESOPHAGEAL ECHOCARDIOGRAM (TEE);  Surgeon: Lelon Perla, MD;  Location: [facility] ENDOSCOPY;  Service: Cardiovascular;  Laterality: N/A;    Current Outpatient Medications    Medication Sig Dispense Refill  . albuterol (VENTOLIN HFA) 108 (90 Base) MCG/ACT inhaler Inhale 2 puffs into the lungs every 6 (six) hours as needed for wheezing or shortness of breath. 90 g 3  . apixaban (ELIQUIS) 5 MG TABS tablet Take 1 tablet (5 mg total) by mouth 2 (two) times daily. 60 tablet 6  . atorvastatin (LIPITOR) 80 MG tablet Take 80 mg by mouth daily.    . isosorbide mononitrate (IMDUR) 30 MG 24 hr tablet Take 1 tablet (30 mg total) by mouth daily. 30 tablet 0  . metoprolol succinate (TOPROL-XL) 100 MG 24 hr tablet Take 1 tablet (100 mg total) by mouth daily. 30 tablet 0  . nitroGLYCERIN (NITROSTAT) 0.4 MG SL tablet Place 1 tablet (0.4 mg total) under the tongue every 5 (five) minutes as needed for chest pain (Up to 3 doses). 25 tablet 3  . potassium chloride SA (KLOR-CON) 20 MEQ tablet Take 20 mEq by mouth 2 (two)  times daily.    Marland Kitchen torsemide (DEMADEX) 20 MG tablet Take 2 tablets (40 mg total) by mouth 2 (two) times daily. 360 tablet 3  . hydrALAZINE (APRESOLINE) 10 MG tablet Take 1 tablet (10 mg total) by mouth 2 (two) times daily. 180 tablet 0   No current facility-administered medications for this visit.   Allergies:  Patient has no known allergies.   Social History: The patient  reports that he has been smoking cigarettes. He has a 15.00 pack-year smoking history. He has never used smokeless tobacco. He reports that he does not drink alcohol or use drugs.   Family History: The patient's family history includes Brain cancer in his mother; Heart disease in his father; Heart failure in his father; Leukemia in his mother; Sickle cell anemia in his mother and another family member.   ROS:  Please see the history of present illness. Otherwise, complete review of systems is positive for none.  All other systems are reviewed and negative.   Physical Exam: VS:  BP 113/69   Pulse (!) 102   Ht 6' 3.5" (1.918 m)   Wt 178 lb 3.2 oz (80.8 kg)   BMI 21.98 kg/m , BMI Body mass index is  21.98 kg/m.  Wt Readings from Last 3 Encounters:  09/23/19 178 lb 3.2 oz (80.8 kg)  08/26/19 180 lb 3.2 oz (81.7 kg)  08/14/19 187 lb (84.8 kg)    General: Patient appears comfortable at rest. Neck: Supple, no elevated JVP or carotid bruits, no thyromegaly. Lungs: Expiratory wheezing noted throughout posterior lung bases and one third up, mildly labored breathing at rest. Cardiac: Irregularly irregular rate and rhythm, no S3 or significant systolic murmur, no pericardial rub. Extremities: No pitting edema, distal pulses 2+. Skin: Warm and dry. Neuropsychiatric: Alert and oriented x3, affect grossly appropriate.  ECG:  An ECG dated September 23, 2019. was personally reviewed today and demonstrated:  Atrial fibrillation with PVCs or aberrantly conducted complexes rate of 73, anteroseptal infarct, age undetermined, ST and T wave abnormality, consider lateral ischemia  Recent Labwork: 06/22/2019: B Natriuretic Peptide 1,512.4 06/23/2019: ALT 38; AST 40; Magnesium 2.0 07/29/2019: Hemoglobin 12.7; Platelets 191 08/15/2019: NT-Pro BNP 16,511 08/26/2019: BUN 35; Creatinine, Ser 1.96; Potassium 4.0; Sodium 140     Component Value Date/Time   CHOL 127 06/25/2018 1107   TRIG 117 06/25/2018 1107   HDL 30 (L) 06/25/2018 1107   CHOLHDL 4.2 06/25/2018 1107   CHOLHDL 3.8 10/24/2014 0943   VLDL 22 10/24/2014 0943   LDLCALC 74 06/25/2018 1107   LDLDIRECT 76.3 08/13/2010 0931    Other Studies Reviewed Today:  Echocardiogram August 08, 2019 1. Left ventricular ejection fraction, by visual estimation, is 25 to 30%. The left ventricle has severely decreased function. There is no left ventricular hypertrophy. 2. Global right ventricle has moderately reduced systolic function.The right ventricular size is moderately enlarged. 3. Left atrial size was severely dilated. 4. Right atrial size was moderately dilated. 5. The mitral valve is grossly normal. Moderate mitral valve regurgitation. 6. The  tricuspid valve is grossly normal. Tricuspid valve regurgitation is mild. 7. The aortic valve is tricuspid. Aortic valve regurgitation is mild. Mild aortic valve sclerosis without stenosis. 8. The pulmonic valve was grossly normal. Pulmonic valve regurgitation is mild. 9. Severe plaque invoving the descending aorta. 10. Global hypokinesis with akinesis of the inferior wall; overall severely reduced LV systolic function; severe LAE; no LAA thrombus; moderate RAE; moderate RVE with moderate RV dysfuncttion; mild AI; moderate (3+) MR;  mild TR and PI.  Carotid artery Doppler study August 07, 2019 Summary: *See table(s) above for measurements and observations. Suggest follow up study in 12 months. Electronically signed by Ida Rogue MD on 08/07/2019 at 4:16:11 PM. Vertebral PSV cm/s 29 EDV cm/s 7 Antegrade Right Carotid: Velocities in the right ICA are consistent with a 40-59% stenosis. Non-hemodynamically significant plaque <50% noted in the CCA. Left Carotid: Velocities in the left ICA are consistent with a 40-59% stenosis. Non-hemodynamically significant plaque <50% noted in the CCA. Vertebrals: Bilateral vertebral arteries demonstrate antegrade flow. Subclavians: Normal flow hemodynamics were seen in bilateral subclavian arteries.  Abdominal aortic ultrasound March 15, 2019 Summary: Abdominal Aorta: There is evidence of abnormal dilitation of the Distal Abdominal aorta. The largest aortic diameter remains essentially unchanged compared to prior exam. Previous diameter measurement was 3.0 cm obtained on 10/2017.  Stenosis: Atherosclerosis throughout the aorta and bilateral common and external iliac arteries. The right common iliac is occluded which has progressed from prior exam. The external iliac artery is occluded. The left distal external iliac artery has a >50% stenosis and is stable compared to prior exam.  Technically challenging study due to bowel gas and flash  artifact.   Peripheral vascular catheterization December 21, 2015 IMPRESSION:occluded  Right external iliac artery not percutaneously addressable. Occluded left SFA not percutaneously addressable.. Continued medical therapy will be recommended.  Assessment and Plan:  1. Coronary artery disease involving native coronary artery of native heart without angina pectoris   2. Ischemic cardiomyopathy   3. Permanent atrial fibrillation ([place])   4. Chronic systolic heart failure ([place])   5. AAA (abdominal aortic aneurysm) without rupture ([place])   6. Bilateral carotid artery stenosis   7. Stage 3 chronic kidney disease, unspecified whether stage 3a or 3b CKD   8. Mixed hyperlipidemia   9. Moderate mitral regurgitation   10. TOBACCO ABUSE   11. Dyspnea, unspecified type    .1. Coronary artery disease involving native coronary artery of native heart without angina pectoris Previous PCI of LAD in October 2004. catheterization April 2014 showed patent LAD stent with a 40% in-stent restenosis.  There was an 80% apical lesion.  The circumflex was occluded.  Right coronary was small and nondominant. The attempt at PCI of the occluded circumflex was unsuccessful.  He currently denies any chest pain, chest pressure, chest tightness, neck, arm, jaw, or back pain on exertion.Only complaint is shortness of breath with and without exertion. Continue Imdur 30 mg QD, Toprol 100 mg QD, and sublingual nitroglycerin as needed.  2. Ischemic cardiomyopathy Recent echocardiogram showed decreased EF to 25 to 30%.  Will need follow-up echocardiograms in the future to check for improved ejection fraction and evaluate mitral valve regurgitation. Add hydralazine 10 mg p.o. twice daily.   3. Permanent atrial fibrillation [facility]) Patient remains in atrial fibrillation on EKG today.  Atrial fibrillation with premature ventricular complexes.  Heart rate of 73, anteroseptal infarct, age undetermined, ST and T wave abnormality, consider  lateral ischemia.  Continue Toprol XL 100 mg daily.  Continue Eliquis 5 mg daily.  4. Chronic systolic heart failure ([facility]) Recent echocardiogram showed EF of 25 to 30%.  Severe left atrial dilatation, moderate MR. Continue torsemide 20 mg 2 tablets by mouth twice daily.   5. AAA (abdominal aortic aneurysm) without rupture ([facility]) Recent abdominal aortic ultrasound July 2020 showed; abnormal dilitation of the Distal Abdominal aorta. The largest aortic diameter remains essentially unchanged compared to prior exam. Previous diameter measurement was 3.0 cm  obtained on 10/2017.  Follow-up ultrasound study scheduled for July 2022  6. Bilateral carotid artery stenosis Patient has bilateral carotid artery disease.  Both left and right ICA 40- 59% stenosis noted.  A follow-up study was suggested in 12 months.  7. Stage 3 chronic kidney disease, unspecified whether stage 3a or 3b CKD Recent lab work performed December 21, 202020 showed an increase of creatinine of 1.96 and GFR of 37 from a previous creatinine and GFR of 1.84 and 40.  Get BMP today.  8. Mixed hyperlipidemia Continue high intensity statin medication atorvastatin 80 mg daily.  9. Moderate mitral regurgitation On recent echo December 2020.  10. TOBACCO ABUSE Highly advised patient to cease smoking.  Patient states he has decreased the amount he is smoking but has not stopped at this point.  11. Dyspnea, unspecified type Patient complains of increased dyspnea on exertion and at rest.  History of significant smoking.  Obvious wheezing noted throughout posterior lung bases and one third up biaolterally.  He uses an albuterol inhaler recently acquired from  primary care provider for dyspnea and wheezing 2-3 times per day.  Likely has a significant element of COPD.  Will refer to pulmonology.  Patient also needs a primary care provider.    Medication Adjustments/Labs and Tests Ordered: Current medicines are reviewed at length with the  patient today.  Concerns regarding medicines are outlined above.    Patient Instructions  Medication Instructions:   START HYDRALAZINE 10 MG 2 TIMES A DAY *If you need a refill on your cardiac medications before your next appointment, please call your pharmacy*  Lab Work: Your physician recommends that you return for lab work TODAY:  BMET  If you have labs (blood work) drawn today and your tests are completely normal, you will receive your results only by: Marland Kitchen MyChart Message (if you have MyChart) OR . A paper copy in the mail If you have any lab test that is abnormal or we need to change your treatment, we will call you to review the results.  Testing/Procedures: You have been referred to PULMONOLOGY  Follow-Up: At [facility], you and your health needs are our priority.  As part of our continuing mission to provide you with exceptional heart care, we have created designated Provider Care Teams.  These Care Teams include your primary Cardiologist (physician) and Advanced Practice Providers (APPs -  Physician Assistants and Nurse Practitioners) who all work together to provide you with the care you need, when you need it.  Your next appointment:   2-3 week(s)  The format for your next appointment:   In Person  Provider:   Almyra Deforest, PA-C  Other Instructions  MAKE [facility] [place] [facility] [phone]          Signed, Levell July, NP 09/23/2019 12:41 PM    [facility] [place], [place], [place] [address] Phone: [phone]; Fax: [phone]

## 2019-09-23 NOTE — Patient Instructions (Addendum)
Medication Instructions:   START HYDRALAZINE 10 MG 2 TIMES A DAY *If you need a refill on your cardiac medications before your next appointment, please call your pharmacy*  Lab Work: Your physician recommends that you return for lab work TODAY:  BMET  If you have labs (blood work) drawn today and your tests are completely normal, you will receive your results only by: Marland Kitchen MyChart Message (if you have MyChart) OR . A paper copy in the mail If you have any lab test that is abnormal or we need to change your treatment, we will call you to review the results.  Testing/Procedures: You have been referred to PULMONOLOGY  Follow-Up: At [facility], you and your health needs are our priority.  As part of our continuing mission to provide you with exceptional heart care, we have created designated Provider Care Teams.  These Care Teams include your primary Cardiologist (physician) and Advanced Practice Providers (APPs -  Physician Assistants and Nurse Practitioners) who all work together to provide you with the care you need, when you need it.  Your next appointment:   2-3 week(s)  The format for your next appointment:   In Person  Provider:   Almyra Deforest, PA-C  Other Instructions  [place] [phone]

## 2019-09-24 NOTE — Progress Notes (Signed)
I think that is very reasonable. Renal function and electrolyte stable. I can always repeat another BMET when he returns to see me in 2-3 weeks

## 2019-09-28 ENCOUNTER — Emergency Department (HOSPITAL_COMMUNITY): Payer: Medicare HMO

## 2019-09-28 ENCOUNTER — Other Ambulatory Visit: Payer: Self-pay

## 2019-09-28 ENCOUNTER — Encounter (HOSPITAL_COMMUNITY): Payer: Self-pay

## 2019-09-28 ENCOUNTER — Inpatient Hospital Stay (HOSPITAL_COMMUNITY)
Admission: EM | Admit: 2019-09-28 | Discharge: 2019-10-06 | DRG: 286 | Disposition: A | Discharge status: Home Health | Payer: Medicare HMO | Attending: Internal Medicine | Admitting: Internal Medicine

## 2019-09-28 DIAGNOSIS — Z8674 Personal history of sudden cardiac arrest: Secondary | ICD-10-CM

## 2019-09-28 DIAGNOSIS — I493 Ventricular premature depolarization: Secondary | ICD-10-CM | POA: Diagnosis not present

## 2019-09-28 DIAGNOSIS — I472 Ventricular tachycardia: Secondary | ICD-10-CM | POA: Diagnosis not present

## 2019-09-28 DIAGNOSIS — Z955 Presence of coronary angioplasty implant and graft: Secondary | ICD-10-CM | POA: Diagnosis not present

## 2019-09-28 DIAGNOSIS — J441 Chronic obstructive pulmonary disease with (acute) exacerbation: Secondary | ICD-10-CM | POA: Diagnosis present

## 2019-09-28 DIAGNOSIS — Z87891 Personal history of nicotine dependence: Secondary | ICD-10-CM

## 2019-09-28 DIAGNOSIS — E876 Hypokalemia: Secondary | ICD-10-CM | POA: Diagnosis not present

## 2019-09-28 DIAGNOSIS — E1151 Type 2 diabetes mellitus with diabetic peripheral angiopathy without gangrene: Secondary | ICD-10-CM | POA: Diagnosis present

## 2019-09-28 DIAGNOSIS — N1832 Chronic kidney disease, stage 3b: Secondary | ICD-10-CM | POA: Diagnosis present

## 2019-09-28 DIAGNOSIS — I340 Nonrheumatic mitral (valve) insufficiency: Secondary | ICD-10-CM | POA: Diagnosis not present

## 2019-09-28 DIAGNOSIS — N179 Acute kidney failure, unspecified: Secondary | ICD-10-CM | POA: Diagnosis present

## 2019-09-28 DIAGNOSIS — Z20822 Contact with and (suspected) exposure to covid-19: Secondary | ICD-10-CM | POA: Diagnosis present

## 2019-09-28 DIAGNOSIS — E875 Hyperkalemia: Secondary | ICD-10-CM | POA: Diagnosis not present

## 2019-09-28 DIAGNOSIS — E782 Mixed hyperlipidemia: Secondary | ICD-10-CM | POA: Diagnosis present

## 2019-09-28 DIAGNOSIS — D631 Anemia in chronic kidney disease: Secondary | ICD-10-CM | POA: Diagnosis present

## 2019-09-28 DIAGNOSIS — I255 Ischemic cardiomyopathy: Secondary | ICD-10-CM | POA: Diagnosis not present

## 2019-09-28 DIAGNOSIS — I252 Old myocardial infarction: Secondary | ICD-10-CM

## 2019-09-28 DIAGNOSIS — I509 Heart failure, unspecified: Secondary | ICD-10-CM

## 2019-09-28 DIAGNOSIS — R519 Headache, unspecified: Secondary | ICD-10-CM | POA: Diagnosis not present

## 2019-09-28 DIAGNOSIS — R0602 Shortness of breath: Secondary | ICD-10-CM

## 2019-09-28 DIAGNOSIS — I5043 Acute on chronic combined systolic (congestive) and diastolic (congestive) heart failure: Secondary | ICD-10-CM | POA: Diagnosis present

## 2019-09-28 DIAGNOSIS — Z7901 Long term (current) use of anticoagulants: Secondary | ICD-10-CM | POA: Diagnosis not present

## 2019-09-28 DIAGNOSIS — Z8673 Personal history of transient ischemic attack (TIA), and cerebral infarction without residual deficits: Secondary | ICD-10-CM

## 2019-09-28 DIAGNOSIS — I714 Abdominal aortic aneurysm, without rupture: Secondary | ICD-10-CM | POA: Diagnosis present

## 2019-09-28 DIAGNOSIS — I130 Hypertensive heart and chronic kidney disease with heart failure and stage 1 through stage 4 chronic kidney disease, or unspecified chronic kidney disease: Principal | ICD-10-CM | POA: Diagnosis present

## 2019-09-28 DIAGNOSIS — I361 Nonrheumatic tricuspid (valve) insufficiency: Secondary | ICD-10-CM | POA: Diagnosis not present

## 2019-09-28 DIAGNOSIS — I348 Other nonrheumatic mitral valve disorders: Secondary | ICD-10-CM | POA: Diagnosis present

## 2019-09-28 DIAGNOSIS — I5023 Acute on chronic systolic (congestive) heart failure: Secondary | ICD-10-CM | POA: Diagnosis not present

## 2019-09-28 DIAGNOSIS — J9621 Acute and chronic respiratory failure with hypoxia: Secondary | ICD-10-CM | POA: Diagnosis not present

## 2019-09-28 DIAGNOSIS — I272 Pulmonary hypertension, unspecified: Secondary | ICD-10-CM | POA: Diagnosis present

## 2019-09-28 DIAGNOSIS — I5021 Acute systolic (congestive) heart failure: Secondary | ICD-10-CM | POA: Diagnosis present

## 2019-09-28 DIAGNOSIS — I4891 Unspecified atrial fibrillation: Secondary | ICD-10-CM | POA: Diagnosis not present

## 2019-09-28 DIAGNOSIS — R55 Syncope and collapse: Secondary | ICD-10-CM | POA: Diagnosis not present

## 2019-09-28 DIAGNOSIS — Z832 Family history of diseases of the blood and blood-forming organs and certain disorders involving the immune mechanism: Secondary | ICD-10-CM

## 2019-09-28 DIAGNOSIS — I2510 Atherosclerotic heart disease of native coronary artery without angina pectoris: Secondary | ICD-10-CM | POA: Diagnosis present

## 2019-09-28 DIAGNOSIS — Z808 Family history of malignant neoplasm of other organs or systems: Secondary | ICD-10-CM

## 2019-09-28 DIAGNOSIS — Z806 Family history of leukemia: Secondary | ICD-10-CM

## 2019-09-28 DIAGNOSIS — E1122 Type 2 diabetes mellitus with diabetic chronic kidney disease: Secondary | ICD-10-CM | POA: Diagnosis present

## 2019-09-28 DIAGNOSIS — M542 Cervicalgia: Secondary | ICD-10-CM | POA: Diagnosis not present

## 2019-09-28 DIAGNOSIS — I4821 Permanent atrial fibrillation: Secondary | ICD-10-CM | POA: Diagnosis not present

## 2019-09-28 DIAGNOSIS — I4901 Ventricular fibrillation: Secondary | ICD-10-CM | POA: Diagnosis present

## 2019-09-28 DIAGNOSIS — Z8719 Personal history of other diseases of the digestive system: Secondary | ICD-10-CM

## 2019-09-28 DIAGNOSIS — N189 Chronic kidney disease, unspecified: Secondary | ICD-10-CM | POA: Diagnosis not present

## 2019-09-28 DIAGNOSIS — Z79899 Other long term (current) drug therapy: Secondary | ICD-10-CM

## 2019-09-28 DIAGNOSIS — Z7982 Long term (current) use of aspirin: Secondary | ICD-10-CM

## 2019-09-28 DIAGNOSIS — I110 Hypertensive heart disease with heart failure: Secondary | ICD-10-CM | POA: Diagnosis not present

## 2019-09-28 DIAGNOSIS — Z8249 Family history of ischemic heart disease and other diseases of the circulatory system: Secondary | ICD-10-CM

## 2019-09-28 DIAGNOSIS — I5082 Biventricular heart failure: Secondary | ICD-10-CM | POA: Diagnosis present

## 2019-09-28 LAB — COMPREHENSIVE METABOLIC PANEL
ALT: 25 U/L (ref 0–44)
AST: 32 U/L (ref 15–41)
Albumin: 3.8 g/dL (ref 3.5–5.0)
Alkaline Phosphatase: 186 U/L — ABNORMAL HIGH (ref 38–126)
Anion gap: 11 (ref 5–15)
BUN: 49 mg/dL — ABNORMAL HIGH (ref 8–23)
CO2: 23 mmol/L (ref 22–32)
Calcium: 9.2 mg/dL (ref 8.9–10.3)
Chloride: 99 mmol/L (ref 98–111)
Creatinine, Ser: 2.6 mg/dL — ABNORMAL HIGH (ref 0.61–1.24)
GFR calc Af Amer: 27 mL/min — ABNORMAL LOW (ref >60)
GFR calc non Af Amer: 23 mL/min — ABNORMAL LOW (ref >60)
Glucose, Bld: 105 mg/dL — ABNORMAL HIGH (ref 70–99)
Potassium: 5.6 mmol/L — ABNORMAL HIGH (ref 3.5–5.1)
Sodium: 133 mmol/L — ABNORMAL LOW (ref 135–145)
Total Bilirubin: 2.9 mg/dL — ABNORMAL HIGH (ref 0.3–1.2)
Total Protein: 8 g/dL (ref 6.5–8.1)

## 2019-09-28 LAB — CBC WITH DIFFERENTIAL/PLATELET
Abs Immature Granulocytes: 0.01 10*3/uL (ref 0.00–0.07)
Basophils Absolute: 0 10*3/uL (ref 0.0–0.1)
Basophils Relative: 1 %
Eosinophils Absolute: 0.1 10*3/uL (ref 0.0–0.5)
Eosinophils Relative: 2 %
HCT: 43.7 % (ref 39.0–52.0)
Hemoglobin: 15.3 g/dL (ref 13.0–17.0)
Immature Granulocytes: 0 %
Lymphocytes Relative: 37 %
Lymphs Abs: 2.1 10*3/uL (ref 0.7–4.0)
MCH: 26.7 pg (ref 26.0–34.0)
MCHC: 35 g/dL (ref 30.0–36.0)
MCV: 76.1 fL — ABNORMAL LOW (ref 80.0–100.0)
Monocytes Absolute: 0.7 10*3/uL (ref 0.1–1.0)
Monocytes Relative: 12 %
Neutro Abs: 2.8 10*3/uL (ref 1.7–7.7)
Neutrophils Relative %: 48 %
Platelets: 185 10*3/uL (ref 150–400)
RBC: 5.74 MIL/uL (ref 4.22–5.81)
RDW: 17 % — ABNORMAL HIGH (ref 11.5–15.5)
WBC: 5.8 10*3/uL (ref 4.0–10.5)
nRBC: 0 % (ref 0.0–0.2)

## 2019-09-28 LAB — RESPIRATORY PANEL BY RT PCR (FLU A&B, COVID)
Influenza A by PCR: NEGATIVE
Influenza B by PCR: NEGATIVE
SARS Coronavirus 2 by RT PCR: NEGATIVE

## 2019-09-28 LAB — BRAIN NATRIURETIC PEPTIDE: B Natriuretic Peptide: 2423.4 pg/mL — ABNORMAL HIGH (ref 0.0–100.0)

## 2019-09-28 MED ORDER — APIXABAN 5 MG PO TABS
5.0000 mg | ORAL_TABLET | Freq: Two times a day (BID) | ORAL | Status: DC
  Administered 2019-09-28 – 2019-09-30 (×4): 5 mg via ORAL
  Filled 2019-09-28 (×2): qty 1
  Filled 2019-09-28: qty 2
  Filled 2019-09-28: qty 1

## 2019-09-28 MED ORDER — SODIUM CHLORIDE 0.9% FLUSH: 3.0000 mL | INTRAVENOUS | Status: DC | PRN

## 2019-09-28 MED ORDER — SODIUM POLYSTYRENE SULFONATE 15 GM/60ML PO SUSP
30.0000 g | Freq: Once | ORAL | Status: AC
  Administered 2019-09-28: 30 g via ORAL
  Filled 2019-09-28: qty 120

## 2019-09-28 MED ORDER — SODIUM CHLORIDE 0.9% FLUSH
3.0000 mL | Freq: Two times a day (BID) | INTRAVENOUS | Status: DC
  Administered 2019-09-28 – 2019-10-01 (×5): 3 mL via INTRAVENOUS

## 2019-09-28 MED ORDER — METOPROLOL SUCCINATE ER 100 MG PO TB24
100.0000 mg | ORAL_TABLET | Freq: Every day | ORAL | Status: DC
  Administered 2019-09-29 – 2019-10-04 (×6): 100 mg via ORAL
  Filled 2019-09-28 (×6): qty 1

## 2019-09-28 MED ORDER — ACETAMINOPHEN 325 MG PO TABS
650.0000 mg | ORAL_TABLET | Freq: Four times a day (QID) | ORAL | Status: DC | PRN
  Administered 2019-10-01: 650 mg via ORAL
  Filled 2019-09-28: qty 2

## 2019-09-28 MED ORDER — ISOSORBIDE MONONITRATE ER 30 MG PO TB24
30.0000 mg | ORAL_TABLET | Freq: Every day | ORAL | Status: DC
  Administered 2019-09-29 – 2019-09-30 (×2): 30 mg via ORAL
  Filled 2019-09-28 (×3): qty 1

## 2019-09-28 MED ORDER — NITROGLYCERIN 0.4 MG SL SUBL: 0.4000 mg | SUBLINGUAL_TABLET | SUBLINGUAL | Status: DC | PRN

## 2019-09-28 MED ORDER — HYDRALAZINE HCL 10 MG PO TABS
10.0000 mg | ORAL_TABLET | Freq: Two times a day (BID) | ORAL | Status: DC
  Administered 2019-09-28 – 2019-09-30 (×5): 10 mg via ORAL
  Filled 2019-09-28 (×6): qty 1

## 2019-09-28 MED ORDER — FUROSEMIDE 10 MG/ML IJ SOLN
40.0000 mg | Freq: Two times a day (BID) | INTRAMUSCULAR | Status: DC
  Filled 2019-09-28: qty 4

## 2019-09-28 MED ORDER — SODIUM CHLORIDE 0.9 % IV SOLN: 250.0000 mL | INTRAVENOUS | Status: DC | PRN

## 2019-09-28 MED ORDER — FUROSEMIDE 10 MG/ML IJ SOLN
80.0000 mg | Freq: Once | INTRAMUSCULAR | Status: AC
  Administered 2019-09-28: 80 mg via INTRAVENOUS
  Filled 2019-09-28: qty 8

## 2019-09-28 MED ORDER — ASPIRIN EC 81 MG PO TBEC
81.0000 mg | DELAYED_RELEASE_TABLET | Freq: Every day | ORAL | Status: DC
  Administered 2019-09-29 – 2019-10-06 (×8): 81 mg via ORAL
  Filled 2019-09-28 (×8): qty 1

## 2019-09-28 MED ORDER — ATORVASTATIN CALCIUM 40 MG PO TABS
80.0000 mg | ORAL_TABLET | Freq: Every day | ORAL | Status: DC
  Administered 2019-09-29 – 2019-10-05 (×7): 80 mg via ORAL
  Filled 2019-09-28 (×6): qty 2

## 2019-09-28 MED ORDER — ALBUTEROL SULFATE HFA 108 (90 BASE) MCG/ACT IN AERS
4.0000 | INHALATION_SPRAY | Freq: Once | RESPIRATORY_TRACT | Status: AC
  Administered 2019-09-28: 4 via RESPIRATORY_TRACT
  Filled 2019-09-28: qty 6.7

## 2019-09-28 NOTE — ED Triage Notes (Signed)
Patient states he has increased  SOB this past week. Patient has a history of CHF.  Room air sats -87% in triage. Patient placed on O2 3L/min via [place] Sats increased to 96%.

## 2019-09-28 NOTE — ED Notes (Signed)
ED TO INPATIENT HANDOFF REPORT  ED Nurse Name and Phone #:  Keshon Markovitz wled   S Name/Age/Gender Logan White 79 y.o. male Room/Bed: WA23/WA23  Code Status   Code Status: Prior  Home/SNF/Other Home  Patient oriented to: self, place, time and situation Is this baseline? Yes   Triage Complete: Triage complete  Chief Complaint CHF (congestive heart failure), NYHA class IV, acute, systolic ([place]) [U72.53]  Triage Note Patient states he has increased  SOB this past week. Patient has a history of CHF.  Room air sats -87% in triage. Patient placed on O2 3L/min via [place] Sats increased to 96%.    Allergies No Known Allergies  Level of Care/Admitting Diagnosis ED Disposition    ED Disposition Condition Comment   Admit  Hospital Area: [facility] [[id number]]  Level of Care: Telemetry [5]  Admit to tele based on following criteria: Acute CHF  Covid Evaluation: Confirmed COVID Negative  Date Laboratory Confirmed COVID Negative: 09/28/2019  Diagnosis: CHF (congestive heart failure), NYHA class IV, acute, systolic [facility]) [[id number]]  Admitting Physician: Artist Beach [[id number]]  Attending Physician: Kinnie Feil  Estimated length of stay: past midnight tomorrow  Certification:: I certify this patient will need inpatient services for at least 2 midnights       B Medical/Surgery History Past Medical History:  Diagnosis Date  . AAA (abdominal aortic aneurysm) ([place])    a. 08/2015 Abd U/S: 2.7 x 2.9 x 3.2 cm infrarenal AAA.  . Bradycardia    a. Requiring discontinuation of use of BB/CCB  . Cardiac arrest - ventricular fibrillation    a. 03/2012 in setting of hypokalemia (prolonged hosp with VDRF, tracheobronchitis, ARF, shock liver, PAF, AMS felt secondary to post-anoxic encephalopathy/shock).  . Carotid artery stenosis    a. 01/2011 - 40-59% bilateral stenosis;  b. 06/2015 Carotid U/S: 40-59% bilat ICA stenosis->f/u 6 mos.  . CKD (chronic kidney  disease), stage III   . Coronary artery disease    a. s/p aborted ant STEMI tx with Cypher DES to LAD 10/04 (residual at cath: D1 50%, CFX 40% and multiple dist 70%, EF 55%);   b. myoview 3/10: Ef 47%, infero-apical isch, LOW RISK - med Tx recommended;  c. 12/2012 VF Arrest/Cath: LAD 40isr, 80 apical, LCX 100 (failed PTCA), RCA nondom.  . Diabetes mellitus   . Diverticulosis 2001  . Elevated LFTs    Shock liver 03/2012  . H/O: CVA (cardiovascular accident)   . Hematemesis    a. 12/2010 felt 2/2 Mallory Weiss tear - pt could not afford colonscopy/EGD at that time so Coumadin was deferred. Coumadin initiated 03/2012 without any evidence for bleeding.  Marland Kitchen History of Ischemic Cardiomyopathy    a. EF 50-55% by echo 03/09/12 (was 30% by echo 02/24/12); b. 03/2013 Echo: EF 55-60%, mild MR, sev dil LA, mod dil RA, PASP 98mmHg.  Marland Kitchen Hypertensive heart disease    a. echo 4/12: EF 50%, asymmetric septal hypertrophy, no SAM or LVOT gradient, LAE, PASP 35  . Inguinal hernia   . 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 Mitral regurgitation    a. Mild by echo 03/2012 and 03/2013.  . Mixed Hyperlipidemia   . Peripheral vascular disease ([place])    a. h/o LE angioplasty;  b. 08/2015 Duplex: >50% RCIA, 100% REIA, >50% LEIA, elev vel in SMA, patent IVC.  Marland Kitchen Persistent atrial fibrillation ([place])    a. Dx 12/2010-->coumadin (CHA2DS2VASc = 7).  . QT prolongation  Past Surgical History:  Procedure Laterality Date  . HERNIA REPAIR    . LEFT HEART CATH N/A 12/30/2012   Procedure: LEFT HEART CATH;  Surgeon: [name], MD;  Location: [facility] CATH LAB;  Service: Cardiovascular;  Laterality: N/A;  . ORIF TIBIA & FIBULA FRACTURES  01/11/07   OPEN TX OF UNICONDYLAR PLATEAU FRACTURE, IRRIGATION/DEBRIDEMENT OF OPEN FRACTURE INCLUDING BONE, REMOVAL OF EXTERNAL FIXATOR UNDER ANESTHESIA PER DR. MICHAEL HANDY  . PERIPHERAL VASCULAR CATHETERIZATION N/A 12/21/2015   Procedure: Lower Extremity Angiography;   Surgeon: [name], MD;  Location: [facility] CV LAB;  Service: Cardiovascular;  Laterality: N/A;  . POPLITEAL ARTERY ANGIOPLASTY  01/07/07   s/p LEFT POPLITEAL ARTERY EXPLORATION AND VEIN PATCH ANGIOPLASTY, PER DR. EARLY, SECONDARY TO ISCHEMIC LEFT FOOT RELATED TO LEFT POPLITEAL ARTERY INJURY  . SKIN GRAFT    . TEE WITHOUT CARDIOVERSION N/A 08/08/2019   Procedure: TRANSESOPHAGEAL ECHOCARDIOGRAM (TEE);  Surgeon: Lelon Perla, MD;  Location: [facility] ENDOSCOPY;  Service: Cardiovascular;  Laterality: N/A;     A IV Location/Drains/Wounds Patient Lines/Drains/Airways Status   Active Line/Drains/Airways    Name:   Placement date:   Placement time:   Site:   Days:   Peripheral IV 09/28/19 Left Antecubital   09/28/19    1823    Antecubital   less than 1   Wound 02/23/12 Abrasion(s) Leg Left;Anterior;Lower 1cm x 0.3cm   02/23/12    0200    Leg   2774          Intake/Output Last 24 hours No intake or output data in the 24 hours ending 09/28/19 2218  Labs/Imaging Results for orders placed or performed during the hospital encounter of 09/28/19 (from the past 48 hour(s))  Comprehensive metabolic panel     Status: Abnormal   Collection Time: 09/28/19  6:29 PM  Result Value Ref Range   Sodium 133 (L) 135 - 145 mmol/L   Potassium 5.6 (H) 3.5 - 5.1 mmol/L   Chloride 99 98 - 111 mmol/L   CO2 23 22 - 32 mmol/L   Glucose, Bld 105 (H) 70 - 99 mg/dL   BUN 49 (H) 8 - 23 mg/dL   Creatinine, Ser 2.60 (H) 0.61 - 1.24 mg/dL   Calcium 9.2 8.9 - 10.3 mg/dL   Total Protein 8.0 6.5 - 8.1 g/dL   Albumin 3.8 3.5 - 5.0 g/dL   AST 32 15 - 41 U/L   ALT 25 0 - 44 U/L   Alkaline Phosphatase 186 (H) 38 - 126 U/L   Total Bilirubin 2.9 (H) 0.3 - 1.2 mg/dL   GFR calc non Af Amer 23 (L) >60 mL/min   GFR calc Af Amer 27 (L) >60 mL/min   Anion gap 11 5 - 15    Comment: Performed at [facility] [place] [address] [place], [place] [address]  CBC with Differential     Status: Abnormal    Collection Time: 09/28/19  6:29 PM  Result Value Ref Range   WBC 5.8 4.0 - 10.5 K/uL   RBC 5.74 4.22 - 5.81 MIL/uL   Hemoglobin 15.3 13.0 - 17.0 g/dL   HCT 43.7 39.0 - 52.0 %   MCV 76.1 (L) 80.0 - 100.0 fL   MCH 26.7 26.0 - 34.0 pg   MCHC 35.0 30.0 - 36.0 g/dL   RDW 17.0 (H) 11.5 - 15.5 %   Platelets 185 150 - 400 K/uL   nRBC 0.0 0.0 - 0.2 %   Neutrophils  Relative % 48 %   Neutro Abs 2.8 1.7 - 7.7 K/uL   Lymphocytes Relative 37 %   Lymphs Abs 2.1 0.7 - 4.0 K/uL   Monocytes Relative 12 %   Monocytes Absolute 0.7 0.1 - 1.0 K/uL   Eosinophils Relative 2 %   Eosinophils Absolute 0.1 0.0 - 0.5 K/uL   Basophils Relative 1 %   Basophils Absolute 0.0 0.0 - 0.1 K/uL   Immature Granulocytes 0 %   Abs Immature Granulocytes 0.01 0.00 - 0.07 K/uL    Comment: Performed at [facility], [place] [address], [place], [place] [address]  Brain natriuretic peptide     Status: Abnormal   Collection Time: 09/28/19  6:29 PM  Result Value Ref Range   B Natriuretic Peptide 2,423.4 (H) 0.0 - 100.0 pg/mL    Comment: Performed at [facility], [place] [address] [place], [place] [address]  Respiratory Panel by RT PCR (Flu A&B, Covid) - Nasopharyngeal Swab     Status: None   Collection Time: 09/28/19  6:29 PM   Specimen: Nasopharyngeal Swab  Result Value Ref Range   SARS Coronavirus 2 by RT PCR NEGATIVE NEGATIVE    Comment: (NOTE) SARS-CoV-2 target nucleic acids are NOT DETECTED. The SARS-CoV-2 RNA is generally detectable in upper respiratoy specimens during the acute phase of infection. The lowest concentration of SARS-CoV-2 viral copies this assay can detect is 131 copies/mL. A negative result does not preclude SARS-Cov-2 infection and should not be used as the sole basis for treatment or other patient management decisions. A negative result may occur with  improper specimen collection/handling, submission of specimen other than nasopharyngeal swab, presence of viral  mutation(s) within the areas targeted by this assay, and inadequate number of viral copies (<131 copies/mL). A negative result must be combined with clinical observations, patient history, and epidemiological information. The expected result is Negative. Fact Sheet for Patients:  PinkCheek.be Fact Sheet for Healthcare Providers:  GravelBags.it This test is not yet ap proved or cleared by the [place] FDA and  has been authorized for detection and/or diagnosis of SARS-CoV-2 by FDA under an Emergency Use Authorization (EUA). This EUA will remain  in effect (meaning this test can be used) for the duration of the COVID-19 declaration under Section 564(b)(1) of the Act, 21 U.S.C. section 360bbb-3(b)(1), unless the authorization is terminated or revoked sooner.    Influenza A by PCR NEGATIVE NEGATIVE   Influenza B by PCR NEGATIVE NEGATIVE    Comment: (NOTE) The Xpert Xpress SARS-CoV-2/FLU/RSV assay is intended as an aid in  the diagnosis of influenza from Nasopharyngeal swab specimens and  should not be used as a sole basis for treatment. Nasal washings and  aspirates are unacceptable for Xpert Xpress SARS-CoV-2/FLU/RSV  testing. Fact Sheet for Patients: PinkCheek.be Fact Sheet for Healthcare Providers: GravelBags.it This test is not yet approved or cleared by the [place] FDA and  has been authorized for detection and/or diagnosis of SARS-CoV-2 by  FDA under an Emergency Use Authorization (EUA). This EUA will remain  in effect (meaning this test can be used) for the duration of the  Covid-19 declaration under Section 564(b)(1) of the Act, 21  U.S.C. section 360bbb-3(b)(1), unless the authorization is  terminated or revoked. Performed at [facility], [place] [address], [place], [place] [address]    CT Head Wo Contrast  Result Date:  09/28/2019 CLINICAL DATA:  Pain status post fall. EXAM: CT HEAD WITHOUT CONTRAST CT CERVICAL SPINE  WITHOUT CONTRAST TECHNIQUE: Multidetector CT imaging of the head and cervical spine was performed following the standard protocol without intravenous contrast. Multiplanar CT image reconstructions of the cervical spine were also generated. COMPARISON:  CT head dated 01/02/2013.  MRI dated Jan 04, 2013. FINDINGS: CT HEAD FINDINGS Brain: No evidence of acute infarction, hemorrhage, hydrocephalus, extra-axial collection or mass lesion/mass effect. Mild age related atrophy is noted. Vascular: No hyperdense vessel or unexpected calcification. Skull: Normal. Negative for fracture or focal lesion. Sinuses/Orbits: No acute finding. Other: None. CT CERVICAL SPINE FINDINGS Alignment: Normal. Skull base and vertebrae: There is no acute displaced fracture. No dislocation. Is height loss of several cervical vertebral bodies, most evident at the C4-C5 levels, likely chronic. Soft tissues and spinal canal: No prevertebral fluid or swelling. No visible canal hematoma. Disc levels:  The disc heights are relatively well preserved. Upper chest: Negative. Other: None IMPRESSION: 1. No acute intracranial abnormality. 2. No acute cervical spine fracture. Electronically Signed   By: Constance Holster M.D.   On: 09/28/2019 19:19   CT Cervical Spine Wo Contrast  Result Date: 09/28/2019 CLINICAL DATA:  Pain status post fall. EXAM: CT HEAD WITHOUT CONTRAST CT CERVICAL SPINE WITHOUT CONTRAST TECHNIQUE: Multidetector CT imaging of the head and cervical spine was performed following the standard protocol without intravenous contrast. Multiplanar CT image reconstructions of the cervical spine were also generated. COMPARISON:  CT head dated 01/02/2013.  MRI dated Jan 04, 2013. FINDINGS: CT HEAD FINDINGS Brain: No evidence of acute infarction, hemorrhage, hydrocephalus, extra-axial collection or mass lesion/mass effect. Mild age related atrophy is  noted. Vascular: No hyperdense vessel or unexpected calcification. Skull: Normal. Negative for fracture or focal lesion. Sinuses/Orbits: No acute finding. Other: None. CT CERVICAL SPINE FINDINGS Alignment: Normal. Skull base and vertebrae: There is no acute displaced fracture. No dislocation. Is height loss of several cervical vertebral bodies, most evident at the C4-C5 levels, likely chronic. Soft tissues and spinal canal: No prevertebral fluid or swelling. No visible canal hematoma. Disc levels:  The disc heights are relatively well preserved. Upper chest: Negative. Other: None IMPRESSION: 1. No acute intracranial abnormality. 2. No acute cervical spine fracture. Electronically Signed   By: Constance Holster M.D.   On: 09/28/2019 19:19   DG Chest Port 1 View  Result Date: 09/28/2019 CLINICAL DATA:  Shortness of breath EXAM: PORTABLE CHEST 1 VIEW COMPARISON:  06/22/2019 FINDINGS: Heart size is significantly enlarged. The pulmonary arteries are dilated. Prominent interstitial lung markings. Aortic calcifications are noted. There are trace to small bilateral pleural effusions. There is no pneumothorax. IMPRESSION: 1. Cardiomegaly with findings concerning for congestive heart failure. 2. Dilated pulmonary arteries which can be seen in patients with elevated pulmonary artery pressure. 3.  Choose Electronically Signed   By: Constance Holster M.D.   On: 09/28/2019 18:34    Pending Labs Unresulted Labs (From admission, onward)    Start     Ordered   09/28/19 2349  Potassium  Once,   STAT     09/28/19 2148   Signed and Held  Basic metabolic panel  Daily,   R     Signed and Held   Signed and Held  Basic metabolic panel  Tomorrow morning,   R     Signed and Held   Signed and Held  CBC  Tomorrow morning,   R     Signed and Held          Vitals/Pain Today's Vitals   09/28/19 2030 09/28/19 2122 09/28/19 2123  09/28/19 2200  BP: 97/67 (!) 95/52  108/88  Pulse:      Resp: (!) 32 (!) 33 10 14  Temp:       TempSrc:      SpO2:      Weight:      Height:      PainSc:        Isolation Precautions No active isolations  Medications Medications  albuterol (VENTOLIN HFA) 108 (90 Base) MCG/ACT inhaler 4 puff (4 puffs Inhalation Given 09/28/19 1853)  furosemide (LASIX) injection 80 mg (80 mg Intravenous Given 09/28/19 2046)    Mobility walks with device Low fall risk   Focused Assessments Cardiac Assessment Handoff:  Cardiac Rhythm: Atrial fibrillation Lab Results  Component Value Date   CKTOTAL 602 (H) 02/23/2012   CKMB 20.9 (HH) 02/23/2012   TROPONINI 0.05 ([place]) 11/27/2017   No results found for: DDIMER Does the Patient currently have chest pain?     R Recommendations: See Admitting Provider Note  Report given to:   Additional Notes: N/A

## 2019-09-28 NOTE — H&P (Signed)
History and Physical    Logan White RCB:[id number] DOB: 1941/08/31 DOA: 09/28/2019  PCP: Kelton Pillar, MD   Patient coming from: Home  I have personally briefly reviewed patient's old medical records in [place]  Chief Complaint: I have worsening shortness of breath and passed out  HPI: Logan White is a 79 y.o. male with medical history significant for Systolic CHF with EF 80%,[id number] atrial fibrillation on anticoagulation with eliquis, previous V. fib arrest in 2014 secondary to hyperkalemia, ischemic cardiomyopathy secondary to CAD,peripheral vascular disease, previous V. fib arrest and a 3 cm abdominal aortic aneurysm in 2020.  He also has a history of tobacco smoking in the past with possible underlying COPD.  He is being followed at cardiology clinic for progressive congestive heart failure.  Diuretics were increased recently.  As per patient, he has been compliant with his medications and with his fluid intake. Despite all these measures, he continues to have significant exertional dyspnea, orthopnea.  Today, patient had an episode of acute onset shortness of breath while trying to use the bathroom and thinks he passed out.Its unclear how long he passed out but thinks it was a few minutes with associated incontinence of urine and stool.  At that point, he decided to call EMS and patient brought into the ED for further evaluation and management.  No known aggravating or relieving factors.  Denies any associated chest pain or palpitations.  Denies any nausea or vomiting.   He was admitted to bilateral lower extremity edema which she states is chronic and has been persistent.  He admits to have quit tobacco smoking 7 months ago..  Admission was recommended at that time time but patient was not agreeable.  He continues with dyspnea on exertion improved with inhaler use.  Pedal edema was improved but mild edema persisted..       ED Course: Patient arrived with oxygen  saturation of 87% on room air.  He required 3 L of nasal cannula for improvement.  Patient was also given IV Lasix 80 mg with improvement with symptoms.  Review of Systems: As per HPI otherwise 10 point review of systems negative.  Pertinent positives and negatives as noted in HPI. Past Medical History:  Diagnosis Date  . AAA (abdominal aortic aneurysm) ([place])    a. 08/2015 Abd U/S: 2.7 x 2.9 x 3.2 cm infrarenal AAA.  . Bradycardia    a. Requiring discontinuation of use of BB/CCB  . Cardiac arrest - ventricular fibrillation    a. 03/2012 in setting of hypokalemia (prolonged hosp with VDRF, tracheobronchitis, ARF, shock liver, PAF, AMS felt secondary to post-anoxic encephalopathy/shock).  . Carotid artery stenosis    a. 01/2011 - 40-59% bilateral stenosis;  b. 06/2015 Carotid U/S: 40-59% bilat ICA stenosis->f/u 6 mos.  . CKD (chronic kidney disease), stage III   . Coronary artery disease    a. s/p aborted ant STEMI tx with Cypher DES to LAD 10/04 (residual at cath: D1 50%, CFX 40% and multiple dist 70%, EF 55%);   b. myoview 3/10: Ef 47%, infero-apical isch, LOW RISK - med Tx recommended;  c. 12/2012 VF Arrest/Cath: LAD 40isr, 80 apical, LCX 100 (failed PTCA), RCA nondom.  . Diabetes mellitus   . Diverticulosis 2001  . Elevated LFTs    Shock liver 03/2012  . H/O: CVA (cardiovascular accident)   . Hematemesis    a. 12/2010 felt 2/2 Mallory Weiss tear - pt could not afford colonscopy/EGD at that time so Coumadin was  deferred. Coumadin initiated 03/2012 without any evidence for bleeding.  Marland Kitchen History of Ischemic Cardiomyopathy    a. EF 50-55% by echo 03/09/12 (was 30% by echo 02/24/12); b. 03/2013 Echo: EF 55-60%, mild MR, sev dil LA, mod dil RA, PASP 46mmHg.  Marland Kitchen Hypertensive heart disease    a. echo 4/12: EF 50%, asymmetric septal hypertrophy, no SAM or LVOT gradient, LAE, PASP 35  . Inguinal hernia   . 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 Mitral regurgitation    a.  Mild by echo 03/2012 and 03/2013.  . Mixed Hyperlipidemia   . Peripheral vascular disease ([place])    a. h/o LE angioplasty;  b. 08/2015 Duplex: >50% RCIA, 100% REIA, >50% LEIA, elev vel in SMA, patent IVC.  Marland Kitchen Persistent atrial fibrillation ([place])    a. Dx 12/2010-->coumadin (CHA2DS2VASc = 7).  . QT prolongation     Past Surgical History:  Procedure Laterality Date  . HERNIA REPAIR    . LEFT HEART CATH N/A 12/30/2012   Procedure: LEFT HEART CATH;  Surgeon: [name], MD;  Location: [facility] CATH LAB;  Service: Cardiovascular;  Laterality: N/A;  . ORIF TIBIA & FIBULA FRACTURES  01/11/07   OPEN TX OF UNICONDYLAR PLATEAU FRACTURE, IRRIGATION/DEBRIDEMENT OF OPEN FRACTURE INCLUDING BONE, REMOVAL OF EXTERNAL FIXATOR UNDER ANESTHESIA PER DR. MICHAEL HANDY  . PERIPHERAL VASCULAR CATHETERIZATION N/A 12/21/2015   Procedure: Lower Extremity Angiography;  Surgeon: [name], MD;  Location: [facility] CV LAB;  Service: Cardiovascular;  Laterality: N/A;  . POPLITEAL ARTERY ANGIOPLASTY  01/07/07   s/p LEFT POPLITEAL ARTERY EXPLORATION AND VEIN PATCH ANGIOPLASTY, PER DR. EARLY, SECONDARY TO ISCHEMIC LEFT FOOT RELATED TO LEFT POPLITEAL ARTERY INJURY  . SKIN GRAFT    . TEE WITHOUT CARDIOVERSION N/A 08/08/2019   Procedure: TRANSESOPHAGEAL ECHOCARDIOGRAM (TEE);  Surgeon: Lelon Perla, MD;  Location: [facility] ENDOSCOPY;  Service: Cardiovascular;  Laterality: N/A;    Patient has history of previous tobacco smoking.  Quit 7 months ago.  Denies any alcohol use or illicit drug use.  No Known Allergies  Family History  Problem Relation Age of Onset  . Heart disease Father        ALSO UNCLE DIED HAD CAD  . Heart failure Father   . Brain cancer Mother   . Leukemia Mother   . Sickle cell anemia Mother   . Sickle cell anemia Other      Prior to Admission medications   Medication Sig Start Date End Date Taking? Authorizing Provider  albuterol (VENTOLIN HFA) 108 (90 Base) MCG/ACT inhaler Inhale 2 puffs into the  lungs every 6 (six) hours as needed for wheezing or shortness of breath. 08/26/19   Lelon Perla, MD  apixaban (ELIQUIS) 5 MG TABS tablet Take 1 tablet (5 mg total) by mouth 2 (two) times daily. 08/08/19   Bhagat, Crista Luria, PA  atorvastatin (LIPITOR) 80 MG tablet Take 80 mg by mouth daily.    [provider]  hydrALAZINE (APRESOLINE) 10 MG tablet Take 1 tablet (10 mg total) by mouth 2 (two) times daily. 09/23/19 12/22/19  Verta Ellen., NP  isosorbide mononitrate (IMDUR) 30 MG 24 hr tablet Take 1 tablet (30 mg total) by mouth daily. 06/25/19   Swayze, Ava, DO  metoprolol succinate (TOPROL-XL) 100 MG 24 hr tablet Take 1 tablet (100 mg total) by mouth daily. 08/26/19   Lelon Perla, MD  nitroGLYCERIN (NITROSTAT) 0.4 MG SL tablet Place 1 tablet (0.4 mg total) under the  tongue every 5 (five) minutes as needed for chest pain (Up to 3 doses). 01/11/13   Theora Gianotti, NP  potassium chloride SA (KLOR-CON) 20 MEQ tablet Take 20 mEq by mouth 2 (two) times daily.    [provider]  torsemide (DEMADEX) 20 MG tablet Take 2 tablets (40 mg total) by mouth 2 (two) times daily. 08/14/19 11/12/19  Lendon Colonel, NP    Physical Exam: Vitals:   09/28/19 1731 09/28/19 1737 09/28/19 1800 09/28/19 1830  BP:  116/63 95/81 114/72  Pulse:  72  71  Resp:  (!) 22 (!) 23 19  Temp:  (!) 97.4 F (36.3 C)    TempSrc:  Oral    SpO2:  (!) 87%  99%  Weight: 80.8 kg     Height: 6' 3.5" (1.918 m)       Constitutional: Mild respiratory discomfort.  He is alert oriented x3 and able to answer questions appropriately.  He looks, calm. Vitals:   09/28/19 1731 09/28/19 1737 09/28/19 1800 09/28/19 1830  BP:  116/63 95/81 114/72  Pulse:  72  71  Resp:  (!) 22 (!) 23 19  Temp:  (!) 97.4 F (36.3 C)    TempSrc:  Oral    SpO2:  (!) 87%  99%  Weight: 80.8 kg     Height: 6' 3.5" (1.918 m)      Eyes: PERRL, lids and conjunctivae normal ENMT: Mucous membranes are moist. Posterior  pharynx clear of any exudate or lesions. Poor dentition.  Neck: normal, supple, no masses, no thyromegaly.  Mild JVD. Respiratory: Diminished breath sounds bilaterally.  Mild crackles at the bases of the lungs appreciated.  No use of accessory muscles at this time.  Cardiovascular: Regular rate and rhythm, no murmurs / rubs / gallops.1+ lower extremity edema. 1+ pedal pulses. No carotid bruits.  Abdomen: no tenderness, no masses palpated. No hepatosplenomegaly. Bowel sounds positive.  Musculoskeletal: no clubbing / cyanosis. No joint deformity upper and lower extremities. Good ROM, no contractures. Normal muscle tone.  Skin: no rashes, lesions, ulcers. No induration Neurologic: CN 2-12 grossly intact. Sensation intact, DTR normal. Strength 5/5 in all 4.  Psychiatric: Normal judgment and insight. Alert and oriented x 3. Normal mood.   Labs on Admission: I have personally reviewed following labs and imaging studies  CBC: Recent Labs  Lab 09/28/19 1829  WBC 5.8  NEUTROABS 2.8  HGB 15.3  HCT 43.7  MCV 76.1*  PLT 829   Basic Metabolic Panel: Recent Labs  Lab 09/23/19 1007 09/28/19 1829  NA 140 133*  K 4.5 5.6*  CL 101 99  CO2 23 23  GLUCOSE 79 105*  BUN 39* 49*  CREATININE 1.83* 2.60*  CALCIUM 9.5 9.2   GFR: Estimated Creatinine Clearance: 27.6 mL/min (A) (by C-G formula based on SCr of 2.6 mg/dL (H)). Liver Function Tests: Recent Labs  Lab 09/28/19 1829  AST 32  ALT 25  ALKPHOS 186*  BILITOT 2.9*  PROT 8.0  ALBUMIN 3.8   No results for input(s): LIPASE, AMYLASE in the last 168 hours. No results for input(s): AMMONIA in the last 168 hours. Coagulation Profile: No results for input(s): INR, PROTIME in the last 168 hours. Cardiac Enzymes: No results for input(s): CKTOTAL, CKMB, CKMBINDEX, TROPONINI in the last 168 hours. BNP (last 3 results) Recent Labs    08/15/19 0949  PROBNP 16,511*   HbA1C: No results for input(s): HGBA1C in the last 72 hours. CBG: No  results for input(s): GLUCAP  in the last 168 hours. Lipid Profile: No results for input(s): CHOL, HDL, LDLCALC, TRIG, CHOLHDL, LDLDIRECT in the last 72 hours. Thyroid Function Tests: No results for input(s): TSH, T4TOTAL, FREET4, T3FREE, THYROIDAB in the last 72 hours. Anemia Panel: No results for input(s): VITAMINB12, FOLATE, FERRITIN, TIBC, IRON, RETICCTPCT in the last 72 hours. Urine analysis:    Component Value Date/Time   COLORURINE YELLOW 11/27/2017 0136   APPEARANCEUR CLEAR 11/27/2017 0136   LABSPEC 1.011 11/27/2017 0136   PHURINE 5.0 11/27/2017 0136   GLUCOSEU NEGATIVE 11/27/2017 0136   HGBUR SMALL (A) 11/27/2017 0136   BILIRUBINUR NEGATIVE 11/27/2017 0136   KETONESUR NEGATIVE 11/27/2017 0136   PROTEINUR NEGATIVE 11/27/2017 0136   UROBILINOGEN 0.2 03/23/2013 1740   NITRITE NEGATIVE 11/27/2017 0136   LEUKOCYTESUR NEGATIVE 11/27/2017 0136    Radiological Exams on Admission: CT Head Wo Contrast  Result Date: 09/28/2019 CLINICAL DATA:  Pain status post fall. EXAM: CT HEAD WITHOUT CONTRAST CT CERVICAL SPINE WITHOUT CONTRAST TECHNIQUE: Multidetector CT imaging of the head and cervical spine was performed following the standard protocol without intravenous contrast. Multiplanar CT image reconstructions of the cervical spine were also generated. COMPARISON:  CT head dated 01/02/2013.  MRI dated Jan 04, 2013. FINDINGS: CT HEAD FINDINGS Brain: No evidence of acute infarction, hemorrhage, hydrocephalus, extra-axial collection or mass lesion/mass effect. Mild age related atrophy is noted. Vascular: No hyperdense vessel or unexpected calcification. Skull: Normal. Negative for fracture or focal lesion. Sinuses/Orbits: No acute finding. Other: None. CT CERVICAL SPINE FINDINGS Alignment: Normal. Skull base and vertebrae: There is no acute displaced fracture. No dislocation. Is height loss of several cervical vertebral bodies, most evident at the C4-C5 levels, likely chronic. Soft tissues and spinal  canal: No prevertebral fluid or swelling. No visible canal hematoma. Disc levels:  The disc heights are relatively well preserved. Upper chest: Negative. Other: None IMPRESSION: 1. No acute intracranial abnormality. 2. No acute cervical spine fracture. Electronically Signed   By: Constance Holster M.D.   On: 09/28/2019 19:19   CT Cervical Spine Wo Contrast  Result Date: 09/28/2019 CLINICAL DATA:  Pain status post fall. EXAM: CT HEAD WITHOUT CONTRAST CT CERVICAL SPINE WITHOUT CONTRAST TECHNIQUE: Multidetector CT imaging of the head and cervical spine was performed following the standard protocol without intravenous contrast. Multiplanar CT image reconstructions of the cervical spine were also generated. COMPARISON:  CT head dated 01/02/2013.  MRI dated Jan 04, 2013. FINDINGS: CT HEAD FINDINGS Brain: No evidence of acute infarction, hemorrhage, hydrocephalus, extra-axial collection or mass lesion/mass effect. Mild age related atrophy is noted. Vascular: No hyperdense vessel or unexpected calcification. Skull: Normal. Negative for fracture or focal lesion. Sinuses/Orbits: No acute finding. Other: None. CT CERVICAL SPINE FINDINGS Alignment: Normal. Skull base and vertebrae: There is no acute displaced fracture. No dislocation. Is height loss of several cervical vertebral bodies, most evident at the C4-C5 levels, likely chronic. Soft tissues and spinal canal: No prevertebral fluid or swelling. No visible canal hematoma. Disc levels:  The disc heights are relatively well preserved. Upper chest: Negative. Other: None IMPRESSION: 1. No acute intracranial abnormality. 2. No acute cervical spine fracture. Electronically Signed   By: Constance Holster M.D.   On: 09/28/2019 19:19   DG Chest Port 1 View  Result Date: 09/28/2019 CLINICAL DATA:  Shortness of breath EXAM: PORTABLE CHEST 1 VIEW COMPARISON:  06/22/2019 FINDINGS: Heart size is significantly enlarged. The pulmonary arteries are dilated. Prominent interstitial  lung markings. Aortic calcifications are noted. There are trace to small  bilateral pleural effusions. There is no pneumothorax. IMPRESSION: 1. Cardiomegaly with findings concerning for congestive heart failure. 2. Dilated pulmonary arteries which can be seen in patients with elevated pulmonary artery pressure. 3.  Choose Electronically Signed   By: Constance Holster M.D.   On: 09/28/2019 18:34    EKG: Independently reviewed  Assessment/Plan Active Problems:   CHF (congestive heart failure), NYHA class IV, acute, systolic (HCC)  # Acute Systolic CHF exacerbation: Patient with known history of chronic systolic CHF with left ventricular ejection fraction of 35%.  Patient admits compliance with diuretics with torsemide.  Patient will be optimized with IV Lasix 40 mg twice daily.  Daily weight monitoring.  Strict I's and O's monitoring.  Fluid restriction advised.  Cardiology input will be advisable in a.m due to progressive worsening CHF with NYHA stage IV.  Continue with oxygen supplementation to maintain oxygen sats above 92%.  Follow-up echo in a.m.  # Acute Hyperkalemia: Present on admission.  Likely due to potassium supplementation as per med rec.  Will hold potassium replacement therapy.  Will do trial of Kayexalate. Repeat potassium. Patient has known history of V. fib cardiac arrest due to hyperkalemia.  Careful monitoring on telemetry is advised during the course of stay.  # Acute syncope: Etiology likely secondary to hypoxia.  CT head and neck did not show any acute abnormality.  Will monitor on telemetry overnight.  #Acute on chronic kidney injury: Baseline stage III CKD.  Worsening serum creatinine on this admission.  Considering dose of Lasix, careful monitoring of renal function may be warranted.  Hold all nephrotoxic medications.  Nephrology input may be warranted if renal function continues to worsen.  # History of chronic atrial fibrillation: Rate is controlled at this time.  Patient  is on Eliquis 5 mg twice daily.  We will continue with same.  #History of peripheral vascular disease: Asymptomatic at this time.  We will continue with cardioprotective medications.  #Chronic essential hypertension: Blood pressure at this time is stable.  Hold beta-blockers for tonight due to acute CHF.  Will resume beta-blockers and BiDil as tolerated by blood pressure.  # History of AAA.  Stable and being monitored in the outpatient setting.  #History of chronic bronchitis due to tobacco smoking in the past.  No obvious evidence of acute COPD flare this time.  As needed albuterol/ipratropium.  DVT prophylaxis: Eliquis Code Status: Full Family Communication:   Disposition Plan: Home Consults called: Cardiology input prior to discharge will be warranted Admission status: Inpatient  Artist Beach MD [facility] Pager [phone]  If 7PM-7AM, please contact night-coverage [email or web address] Password [facility]  09/28/2019, 8:36 PM

## 2019-09-28 NOTE — ED Provider Notes (Signed)
[facility] DEPT Provider Note   CSN: [id number] Arrival date & time: 09/28/19  1713     History Chief Complaint  Patient presents with  . Shortness of Breath  . Congestive Heart Failure    Logan White is a 79 y.o. male puncture AAA, CHF, cardiac arrest, chronic diverticulitis, CVA who presents for evaluation of shortness of breath.  He states he has been progressively worsening shortness of breath over the last week or so.  He states that it has fluctuated but today felt acutely worse.  He reports he had a syncopal episode today because he felt like he could not breathe.  He states that he had a syncopal episode and fell and hit the ground.  He states that this was unwitnessed.  He thinks that the loss of consciousness was only a few minutes.  He does report he is on blood thinners for treatment of A. fib.  He states he has had a cough that is productive of phlegm.  No hemoptysis.  No chest pain.  He states that he has not been able to sleep much secondary to the coughing and shortness of breath.  He states that he always has a small amount of fluid on his legs and does not feel like it is any worse than normally.  He has been compliant with his Lasix.  He states he has not had any recent travel or known COVID-19 exposure.  He denies any abdominal pain, nausea/vomiting, fevers.  The history is provided by the patient.       Past Medical History:  Diagnosis Date  . AAA (abdominal aortic aneurysm) ([place])    a. 08/2015 Abd U/S: 2.7 x 2.9 x 3.2 cm infrarenal AAA.  . Bradycardia    a. Requiring discontinuation of use of BB/CCB  . Cardiac arrest - ventricular fibrillation    a. 03/2012 in setting of hypokalemia (prolonged hosp with VDRF, tracheobronchitis, ARF, shock liver, PAF, AMS felt secondary to post-anoxic encephalopathy/shock).  . Carotid artery stenosis    a. 01/2011 - 40-59% bilateral stenosis;  b. 06/2015 Carotid U/S: 40-59% bilat ICA stenosis->f/u  6 mos.  . CKD (chronic kidney disease), stage III   . Coronary artery disease    a. s/p aborted ant STEMI tx with Cypher DES to LAD 10/04 (residual at cath: D1 50%, CFX 40% and multiple dist 70%, EF 55%);   b. myoview 3/10: Ef 47%, infero-apical isch, LOW RISK - med Tx recommended;  c. 12/2012 VF Arrest/Cath: LAD 40isr, 80 apical, LCX 100 (failed PTCA), RCA nondom.  . Diabetes mellitus   . Diverticulosis 2001  . Elevated LFTs    Shock liver 03/2012  . H/O: CVA (cardiovascular accident)   . Hematemesis    a. 12/2010 felt 2/2 Mallory Weiss tear - pt could not afford colonscopy/EGD at that time so Coumadin was deferred. Coumadin initiated 03/2012 without any evidence for bleeding.  Marland Kitchen History of Ischemic Cardiomyopathy    a. EF 50-55% by echo 03/09/12 (was 30% by echo 02/24/12); b. 03/2013 Echo: EF 55-60%, mild MR, sev dil LA, mod dil RA, PASP 37mmHg.  Marland Kitchen Hypertensive heart disease    a. echo 4/12: EF 50%, asymmetric septal hypertrophy, no SAM or LVOT gradient, LAE, PASP 35  . Inguinal hernia   . 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 Mitral regurgitation    a. Mild by echo 03/2012 and 03/2013.  . Mixed Hyperlipidemia   . Peripheral vascular disease ([place])  a. h/o LE angioplasty;  b. 08/2015 Duplex: >50% RCIA, 100% REIA, >50% LEIA, elev vel in SMA, patent IVC.  Marland Kitchen Persistent atrial fibrillation ([place])    a. Dx 12/2010-->coumadin (CHA2DS2VASc = 7).  . QT prolongation     Patient Active Problem List   Diagnosis Date Noted  . CHF (congestive heart failure), NYHA class IV, acute, systolic ([place]) 76/54/6503  . Persistent atrial fibrillation ([place])   . Acute on chronic systolic CHF (congestive heart failure) ([place]) 06/22/2019  . Acute on chronic systolic (congestive) heart failure ([place]) 06/22/2019  . CAD S/P PCI 10/13/2015  . Cardiomyopathy, new. Etiology apeears to be NICM 10/13/2015  . AAA (abdominal aortic aneurysm) ([place])   . Peripheral vascular disease ([place])   .  Carotid artery stenosis   . Hypertensive heart disease   . Bruit 10/24/2014  . Acute on chronic renal failure ([place]) 01/11/2013  . Acute MI, anterolateral wall, initial episode of care ([place]) 12/31/2012  . Cardiac arrest due to underlying cardiac condition ([place]) 12/31/2012  . VF (ventricular fibrillation) ([place]) 12/31/2012  . Abnormal LFTs 12/31/2012  . Acute on chronic renal insufficiency 12/31/2012  . Chronic anticoagulation 12/31/2012  . Hypokalemia 12/31/2012  . Fatigue 04/23/2012  . Long term (current) use of anticoagulants 03/19/2012  . CKD (chronic kidney disease), stage III 03/16/2012  . Acute confusion/delerium 03/12/2012  . Delirium 03/12/2012  . Acute systolic CHF (congestive heart failure) ([place]) 03/11/2012  . Septic shock(785.52) 02/27/2012    Class: Acute  . Cardiogenic shock ([place]) 02/25/2012  . Acute renal failure ([place]) 02/25/2012  . Anoxic encephalopathy ([place]) 02/25/2012  . Ischemic cardiomyopathy 02/25/2012  . Cardiac arrest ([place]) 02/23/2012  . Acute respiratory failure with hypoxia ([place]) 02/23/2012  . Ventricular fibrillation ([place]) 02/23/2012  . DM (diabetes mellitus) ([place]) 02/23/2012  . Screening for colon cancer 02/10/2011  . Hematemesis 01/13/2011  . Dyspnea 01/13/2011  . RENAL INSUFFICIENCY 08/26/2010  . HYPERPOTASSEMIA 09/15/2009  . TOBACCO ABUSE 08/11/2009  . GLUCOSE INTOLERANCE 10/02/2008  . Hyperlipidemia 10/02/2008  . Essential hypertension 10/02/2008  . Cerebrovascular disease 10/02/2008  . PERIPHERAL VASCULAR DISEASE 10/02/2008  . INGUINAL HERNIA 10/02/2008    Past Surgical History:  Procedure Laterality Date  . HERNIA REPAIR    . LEFT HEART CATH N/A 12/30/2012   Procedure: LEFT HEART CATH;  Surgeon: [name], MD;  Location: [facility] CATH LAB;  Service: Cardiovascular;  Laterality: N/A;  . ORIF TIBIA & FIBULA FRACTURES  01/11/07   OPEN TX OF UNICONDYLAR PLATEAU FRACTURE, IRRIGATION/DEBRIDEMENT OF OPEN FRACTURE INCLUDING BONE, REMOVAL OF EXTERNAL  FIXATOR UNDER ANESTHESIA PER DR. MICHAEL HANDY  . PERIPHERAL VASCULAR CATHETERIZATION N/A 12/21/2015   Procedure: Lower Extremity Angiography;  Surgeon: [name], MD;  Location: [facility] CV LAB;  Service: Cardiovascular;  Laterality: N/A;  . POPLITEAL ARTERY ANGIOPLASTY  01/07/07   s/p LEFT POPLITEAL ARTERY EXPLORATION AND VEIN PATCH ANGIOPLASTY, PER DR. EARLY, SECONDARY TO ISCHEMIC LEFT FOOT RELATED TO LEFT POPLITEAL ARTERY INJURY  . SKIN GRAFT    . TEE WITHOUT CARDIOVERSION N/A 08/08/2019   Procedure: TRANSESOPHAGEAL ECHOCARDIOGRAM (TEE);  Surgeon: Lelon Perla, MD;  Location: [facility] ENDOSCOPY;  Service: Cardiovascular;  Laterality: N/A;       Family History  Problem Relation Age of Onset  . Heart disease Father        ALSO UNCLE DIED HAD CAD  . Heart failure Father   . Brain cancer Mother   . Leukemia Mother   . Sickle cell anemia Mother   .  Sickle cell anemia Other     Social History   Tobacco Use  . Smoking status: Former Smoker    Packs/day: 0.50    Years: 30.00    Pack years: 15.00    Types: Cigarettes    Quit date: 03/12/2012    Years since quitting: 7.5  . Smokeless tobacco: Never Used  Substance Use Topics  . Alcohol use: No    Alcohol/week: 0.0 standard drinks  . Drug use: No    Frequency: 2.0 times per week    Home Medications Prior to Admission medications   Medication Sig Start Date End Date Taking? Authorizing Provider  acetaminophen (TYLENOL) 325 MG tablet Take 650 mg by mouth every 6 (six) hours as needed for headache.   Yes [provider]  albuterol (VENTOLIN HFA) 108 (90 Base) MCG/ACT inhaler Inhale 2 puffs into the lungs every 6 (six) hours as needed for wheezing or shortness of breath. 08/26/19  Yes Lelon Perla, MD  apixaban (ELIQUIS) 5 MG TABS tablet Take 1 tablet (5 mg total) by mouth 2 (two) times daily. 08/08/19  Yes Bhagat, Bhavinkumar, PA  atorvastatin (LIPITOR) 80 MG tablet Take 80 mg by mouth daily.   Yes [provider]  Ensure (ENSURE) Take 237 mLs by mouth 2 (two) times daily as needed (nutritional supplement).   Yes [provider]  hydrALAZINE (APRESOLINE) 10 MG tablet Take 1 tablet (10 mg total) by mouth 2 (two) times daily. 09/23/19 12/22/19 Yes Verta Ellen., NP  metoprolol succinate (TOPROL-XL) 100 MG 24 hr tablet Take 1 tablet (100 mg total) by mouth daily. 08/26/19  Yes Lelon Perla, MD  nitroGLYCERIN (NITROSTAT) 0.4 MG SL tablet Place 1 tablet (0.4 mg total) under the tongue every 5 (five) minutes as needed for chest pain (Up to 3 doses). 01/11/13  Yes Theora Gianotti, NP  potassium chloride SA (KLOR-CON) 20 MEQ tablet Take 20 mEq by mouth 2 (two) times daily.   Yes [provider]  torsemide (DEMADEX) 20 MG tablet Take 2 tablets (40 mg total) by mouth 2 (two) times daily. 08/14/19 11/12/19 Yes Lendon Colonel, NP  isosorbide mononitrate (IMDUR) 30 MG 24 hr tablet Take 1 tablet (30 mg total) by mouth daily. Patient not taking: Reported on 09/28/2019 06/25/19   Swayze, Ava, DO    Allergies    Patient has no known allergies.  Review of Systems   Review of Systems  Constitutional: Negative for fever.  Respiratory: Positive for cough and shortness of breath.   Cardiovascular: Negative for chest pain and leg swelling.  Gastrointestinal: Negative for abdominal pain, nausea and vomiting.  Genitourinary: Negative for dysuria and hematuria.  Neurological: Negative for headaches.  All other systems reviewed and are negative.   Physical Exam Updated Vital Signs BP 112/84 (BP Location: Right Arm)   Pulse (!) 59   Temp 97.7 F (36.5 C) (Oral)   Resp (!) 24   Ht 6' 3.5" (1.918 m)   Wt 80.8 kg   SpO2 98%   BMI 21.98 kg/m   Physical Exam Vitals and nursing note reviewed.  Constitutional:      Appearance: Normal appearance. He is well-developed.  HENT:     Head: Normocephalic and atraumatic.     Comments: No tenderness to palpation of skull. No  deformities or crepitus noted. No open wounds, abrasions or lacerations.  Eyes:     General: Lids are normal.     Conjunctiva/sclera: Conjunctivae normal.  Pupils: Pupils are equal, round, and reactive to light.  Cardiovascular:     Rate and Rhythm: Normal rate and regular rhythm.     Pulses: Normal pulses.     Heart sounds: Normal heart sounds. No murmur. No friction rub. No gallop.   Pulmonary:     Effort: Tachypnea present.     Comments: Tachypnea and increased work of breathing noted.  He is speaking in short to medium sentences.  On lung exam, he is wheezing noted in upper air fields but crackles noted bilateral bases. Abdominal:     Palpations: Abdomen is soft. Abdomen is not rigid.     Tenderness: There is no abdominal tenderness. There is no guarding.  Musculoskeletal:        General: Normal range of motion.     Cervical back: Full passive range of motion without pain.     Comments: 1+ pitting edema noted bilateral lower extremities from the mid tib-fib that extends distally.  No overlying warmth, erythema.  Skin:    General: Skin is warm and dry.     Capillary Refill: Capillary refill takes less than 2 seconds.  Neurological:     Mental Status: He is alert and oriented to person, place, and time.  Psychiatric:        Speech: Speech normal.     ED Results / Procedures / Treatments   Labs (all labs ordered are listed, but only abnormal results are displayed) Labs Reviewed  COMPREHENSIVE METABOLIC PANEL - Abnormal; Notable for the following components:      Result Value   Sodium 133 (*)    Potassium 5.6 (*)    Glucose, Bld 105 (*)    BUN 49 (*)    Creatinine, Ser 2.60 (*)    Alkaline Phosphatase 186 (*)    Total Bilirubin 2.9 (*)    GFR calc non Af Amer 23 (*)    GFR calc Af Amer 27 (*)    All other components within normal limits  CBC WITH DIFFERENTIAL/PLATELET - Abnormal; Notable for the following components:   MCV 76.1 (*)    RDW 17.0 (*)    All other  components within normal limits  BRAIN NATRIURETIC PEPTIDE - Abnormal; Notable for the following components:   B Natriuretic Peptide 2,423.4 (*)    All other components within normal limits  RESPIRATORY PANEL BY RT PCR (FLU A&B, COVID)  POTASSIUM    EKG EKG Interpretation  Date/Time:  Saturday September 28 2019 17:36:08 EST Ventricular Rate:  82 PR Interval:    QRS Duration: 119 QT Interval:  429 QTC Calculation: 502 R Axis:   -74 Text Interpretation: Atrial fibrillation Left anterior fascicular block Anterior infarct, old Abnormal T, consider ischemia, lateral leads since last tracing no significant change Confirmed by Malvin Johns [phone]) on 09/28/2019 6:05:28 PM   Radiology CT Head Wo Contrast  Result Date: 09/28/2019 CLINICAL DATA:  Pain status post fall. EXAM: CT HEAD WITHOUT CONTRAST CT CERVICAL SPINE WITHOUT CONTRAST TECHNIQUE: Multidetector CT imaging of the head and cervical spine was performed following the standard protocol without intravenous contrast. Multiplanar CT image reconstructions of the cervical spine were also generated. COMPARISON:  CT head dated 01/02/2013.  MRI dated Jan 04, 2013. FINDINGS: CT HEAD FINDINGS Brain: No evidence of acute infarction, hemorrhage, hydrocephalus, extra-axial collection or mass lesion/mass effect. Mild age related atrophy is noted. Vascular: No hyperdense vessel or unexpected calcification. Skull: Normal. Negative for fracture or focal lesion. Sinuses/Orbits: No acute finding. Other: None. CT  CERVICAL SPINE FINDINGS Alignment: Normal. Skull base and vertebrae: There is no acute displaced fracture. No dislocation. Is height loss of several cervical vertebral bodies, most evident at the C4-C5 levels, likely chronic. Soft tissues and spinal canal: No prevertebral fluid or swelling. No visible canal hematoma. Disc levels:  The disc heights are relatively well preserved. Upper chest: Negative. Other: None IMPRESSION: 1. No acute intracranial  abnormality. 2. No acute cervical spine fracture. Electronically Signed   By: Constance Holster M.D.   On: 09/28/2019 19:19   CT Cervical Spine Wo Contrast  Result Date: 09/28/2019 CLINICAL DATA:  Pain status post fall. EXAM: CT HEAD WITHOUT CONTRAST CT CERVICAL SPINE WITHOUT CONTRAST TECHNIQUE: Multidetector CT imaging of the head and cervical spine was performed following the standard protocol without intravenous contrast. Multiplanar CT image reconstructions of the cervical spine were also generated. COMPARISON:  CT head dated 01/02/2013.  MRI dated Jan 04, 2013. FINDINGS: CT HEAD FINDINGS Brain: No evidence of acute infarction, hemorrhage, hydrocephalus, extra-axial collection or mass lesion/mass effect. Mild age related atrophy is noted. Vascular: No hyperdense vessel or unexpected calcification. Skull: Normal. Negative for fracture or focal lesion. Sinuses/Orbits: No acute finding. Other: None. CT CERVICAL SPINE FINDINGS Alignment: Normal. Skull base and vertebrae: There is no acute displaced fracture. No dislocation. Is height loss of several cervical vertebral bodies, most evident at the C4-C5 levels, likely chronic. Soft tissues and spinal canal: No prevertebral fluid or swelling. No visible canal hematoma. Disc levels:  The disc heights are relatively well preserved. Upper chest: Negative. Other: None IMPRESSION: 1. No acute intracranial abnormality. 2. No acute cervical spine fracture. Electronically Signed   By: Constance Holster M.D.   On: 09/28/2019 19:19   DG Chest Port 1 View  Result Date: 09/28/2019 CLINICAL DATA:  Shortness of breath EXAM: PORTABLE CHEST 1 VIEW COMPARISON:  06/22/2019 FINDINGS: Heart size is significantly enlarged. The pulmonary arteries are dilated. Prominent interstitial lung markings. Aortic calcifications are noted. There are trace to small bilateral pleural effusions. There is no pneumothorax. IMPRESSION: 1. Cardiomegaly with findings concerning for congestive heart  failure. 2. Dilated pulmonary arteries which can be seen in patients with elevated pulmonary artery pressure. 3.  Choose Electronically Signed   By: Constance Holster M.D.   On: 09/28/2019 18:34    Procedures Procedures (including critical care time)  Medications Ordered in ED Medications  albuterol (VENTOLIN HFA) 108 (90 Base) MCG/ACT inhaler 4 puff (4 puffs Inhalation Given 09/28/19 1853)  furosemide (LASIX) injection 80 mg (80 mg Intravenous Given 09/28/19 2046)    ED Course  I have reviewed the triage vital signs and the nursing notes.  Pertinent labs & imaging results that were available during my care of the patient were reviewed by me and considered in my medical decision making (see chart for details).    MDM Rules/Calculators/A&P                      79 year old male who presents for evaluation of shortness of breath that has been ongoing for about a week but worsened today.  States he has has had a cough.  No chest pain.  No fevers.  Today, he had a syncopal episode that he felt like was from difficulty breathing.  He does state that this was unwitnessed.  He is on blood thinners.  On initial ED arrival, he is afebrile but appears uncomfortable and is tachypneic.  He was 87% on room air.  Patient was placed on  3 L which bumped him up to low 90s.  He did sound somewhat improved.  On exam, he has diffuse wheezing in upper lung fields as well as some rales in the bilateral lung fields.  He has 1+ pitting edema noted bilateral lower extremities.  Concern for COPD exacerbation versus CHF exacerbation.  Also consider infectious etiologies such as Covid.  Covid is negative, flu is negative.  BNP is elevated 2400.  CBC with no significant leukocytosis.  CMP shows potassium of 5.6, BUN of 49, creatinine of 2.6.  This is slightly worse than his baseline creatinine.  He had a creatinine of 1.835 days ago.  Given his CHF status, will hold on gentle hydration because I do not want to fluid  overload him.  CT of cervical spine shows no acute cervical abnormality.  CT head negative for any acute abnormality.  Chest x-ray shows cardiomegaly with f some interstitial lung findings that could be pulmonary edema.  At this time, question if this is COPD versus CHF or mixed back picture.  At this time, he is currently on 5 L O2 and has no oxygen requirement at home.  Given hypoxia, shortness of breath, feel that he needs admission.  Discussed with hospitalist.  Will plan for admission.  Portions of this note were generated with Lobbyist. Dictation errors may occur despite best attempts at proofreading.   Final Clinical Impression(s) / ED Diagnoses Final diagnoses:  Acute on chronic congestive heart failure, unspecified heart failure type ([place])  Shortness of breath  AKI (acute kidney injury) [facility])    Rx / DC Orders ED Discharge Orders         Ordered    Amb Referral to HF Clinic     09/28/19 2032           Desma Mcgregor 09/28/19 2308    Malvin Johns, MD 09/28/19 [phone]

## 2019-09-29 ENCOUNTER — Inpatient Hospital Stay (HOSPITAL_COMMUNITY): Payer: Medicare HMO

## 2019-09-29 DIAGNOSIS — I5023 Acute on chronic systolic (congestive) heart failure: Secondary | ICD-10-CM

## 2019-09-29 DIAGNOSIS — I361 Nonrheumatic tricuspid (valve) insufficiency: Secondary | ICD-10-CM

## 2019-09-29 DIAGNOSIS — I340 Nonrheumatic mitral (valve) insufficiency: Secondary | ICD-10-CM

## 2019-09-29 DIAGNOSIS — N189 Chronic kidney disease, unspecified: Secondary | ICD-10-CM

## 2019-09-29 DIAGNOSIS — R55 Syncope and collapse: Secondary | ICD-10-CM

## 2019-09-29 DIAGNOSIS — N179 Acute kidney failure, unspecified: Secondary | ICD-10-CM

## 2019-09-29 LAB — CBC
HCT: 39.3 % (ref 39.0–52.0)
Hemoglobin: 13.7 g/dL (ref 13.0–17.0)
MCH: 26.4 pg (ref 26.0–34.0)
MCHC: 34.9 g/dL (ref 30.0–36.0)
MCV: 75.9 fL — ABNORMAL LOW (ref 80.0–100.0)
Platelets: 147 10*3/uL — ABNORMAL LOW (ref 150–400)
RBC: 5.18 MIL/uL (ref 4.22–5.81)
RDW: 16.6 % — ABNORMAL HIGH (ref 11.5–15.5)
WBC: 6.2 10*3/uL (ref 4.0–10.5)
nRBC: 0 % (ref 0.0–0.2)

## 2019-09-29 LAB — BASIC METABOLIC PANEL
Anion gap: 11 (ref 5–15)
BUN: 51 mg/dL — ABNORMAL HIGH (ref 8–23)
CO2: 23 mmol/L (ref 22–32)
Calcium: 8.7 mg/dL — ABNORMAL LOW (ref 8.9–10.3)
Chloride: 101 mmol/L (ref 98–111)
Creatinine, Ser: 2.5 mg/dL — ABNORMAL HIGH (ref 0.61–1.24)
GFR calc Af Amer: 28 mL/min — ABNORMAL LOW (ref >60)
GFR calc non Af Amer: 24 mL/min — ABNORMAL LOW (ref >60)
Glucose, Bld: 119 mg/dL — ABNORMAL HIGH (ref 70–99)
Potassium: 4.6 mmol/L (ref 3.5–5.1)
Sodium: 135 mmol/L (ref 135–145)

## 2019-09-29 LAB — ECHOCARDIOGRAM COMPLETE
Height: 75.5 in
Weight: 2851.2 oz

## 2019-09-29 LAB — POTASSIUM: Potassium: 5.5 mmol/L — ABNORMAL HIGH (ref 3.5–5.1)

## 2019-09-29 MED ORDER — METHYLPREDNISOLONE SODIUM SUCC 40 MG IJ SOLR
40.0000 mg | Freq: Two times a day (BID) | INTRAMUSCULAR | Status: DC
  Administered 2019-09-29 – 2019-10-02 (×7): 40 mg via INTRAVENOUS
  Filled 2019-09-29 (×7): qty 1

## 2019-09-29 MED ORDER — ALBUTEROL SULFATE (2.5 MG/3ML) 0.083% IN NEBU
3.0000 mL | INHALATION_SOLUTION | RESPIRATORY_TRACT | Status: DC | PRN
  Administered 2019-09-29 – 2019-10-01 (×4): 3 mL via RESPIRATORY_TRACT
  Filled 2019-09-29 (×3): qty 3

## 2019-09-29 MED ORDER — ALBUTEROL SULFATE (2.5 MG/3ML) 0.083% IN NEBU
2.5000 mg | INHALATION_SOLUTION | Freq: Two times a day (BID) | RESPIRATORY_TRACT | Status: DC
  Administered 2019-09-30 – 2019-10-03 (×8): 2.5 mg via RESPIRATORY_TRACT
  Filled 2019-09-29 (×9): qty 3

## 2019-09-29 MED ORDER — FUROSEMIDE 10 MG/ML IJ SOLN
80.0000 mg | Freq: Two times a day (BID) | INTRAMUSCULAR | Status: DC
  Administered 2019-09-29: 80 mg via INTRAVENOUS
  Filled 2019-09-29: qty 8

## 2019-09-29 NOTE — Progress Notes (Signed)
Md paged of rn concern for pt breathing overall. Pt sill very short of breath with minimal exertion and even at rest at times. Pt remains on 6lnc (pt sating 97percent at this level). Pt lungs have bilat wheezes and rhonci. Breathing treatments have been helpful overall but only provide short term relief. Rn awaiting call back from md. Cardiologist did see pt as well and feels like he is getting very close to his baseline for his chf. Rn will continue to monitor.

## 2019-09-29 NOTE — Plan of Care (Signed)
  Problem: Clinical Measurements: Goal: Cardiovascular complication will be avoided 09/29/2019 0913 by Elza Rafter, RN Outcome: Progressing 09/29/2019 0913 by Elza Rafter, RN Outcome: Progressing   Problem: Activity: Goal: Risk for activity intolerance will decrease 09/29/2019 0913 by Elza Rafter, RN Outcome: Progressing 09/29/2019 0913 by Elza Rafter, RN Outcome: Progressing   Problem: Coping: Goal: Level of anxiety will decrease 09/29/2019 0913 by Elza Rafter, RN Outcome: Progressing 09/29/2019 0913 by Elza Rafter, RN Outcome: Progressing   Problem: Clinical Measurements: Goal: Respiratory complications will improve 09/29/2019 0913 by Elza Rafter, RN Outcome: Progressing 09/29/2019 0913 by Elza Rafter, RN Outcome: Progressing

## 2019-09-29 NOTE — Progress Notes (Signed)
  Echocardiogram 2D Echocardiogram has been performed.  Logan White 09/29/2019, 9:28 AM

## 2019-09-29 NOTE — Consult Note (Addendum)
Cardiology Consultation:   Patient ID: LERON STOFFERS MRN: [id number]; DOB: 11/12/1940  Admit date: 09/28/2019 Date of Consult: 09/29/2019  Primary Care Provider: Kelton Pillar, MD Primary Cardiologist: Kirk Ruths, MD  Primary Electrophysiologist:  None    Patient Profile:   Logan White is a 79 y.o. male with a hx of chronic systolic CHF 2/2 ischemic CM, coronary artery disease s/p prior myocardial infarction, prior VF arrest in the setting of hypokalemia, permanent atrial fibrillation, chronic kidney disease, peripheral arterial disease, mitral regurgitation who is being seen today for the evaluation of decompensated congestive heart failure and syncope at the request of Dr. Sloan Leiter.  History of Present Illness:   Mr. Lahaie is followed in clinic by Dr. [name] and has hx of coronary artery disease s/p prior PCI to the LAD in 2004.  He had a VF arrest in the setting of hypokalemia in 2013 and another VF arrest in the setting of NSTEMI in 2014.  He had an occluded LCx at cath but PCI was unsuccessful.  He has systolic CHF 2/2 ischemic CM and his EF was 25-30% with moderate MR by TEE in 08/2019.  He also has permanent atrial fibrillation and is on chronic anticoagulation with Apixaban, abdominal aortic aneurysm, carotid artery disease, peripheral arterial disease that is managed medically, chronic kidney disease, hyperlipidemia and tobacco use.  He has been seen several times in clinic recently with worsening heart failure.  He initially improved when his furosemide was ? to torsemide.    Prior Cardiac Studies:  TEE 08/08/2019: EF 25-30, mod reduced RVSF, BAE, mod MR, mild AI, TR  Carotid US 08/2019: Bilat ICA 40-59  AAA [place] 03/2019: 3.0 cm abdominal aortic aneurysm   Myoview 10/06/2015: Small fixed apical & inf defect, no ischemia   Cath 12/31/12: LAD stent ok w 40 ISR, apical 80; LCx occluded (unsuccesful PCI)  The patient has continued to have significant exertional  dyspnea.  The admission notes indicate he came to the ED via EMS after an episode of acute shortness of breath while using the bathroom with ?associated syncope.  His O2 was 87% and required 3L O2 on [place] and he had improved symptoms with Furosemide 80 mg IV.  I spoke to the patient's nurse [name] for any collaborative history details.  Patient tells me that he does not know what indiscretions in his diet he may have had, but does note that he does not have much of an appetite anymore.  He will try to put food in his mouth and he will does not want to eat and he will spit it out and put it away.  He does not know how much volume he is drinking at home.  In addition he has had some difficulties with his breathing at home.  He tells me also that he has had an episode of syncope while lying in the bed at home where he lost consciousness and lost control of his bowels.  This was not while standing or visiting the restroom.  He denies chest pain but does endorse continued shortness of breath.  Denies palpitations.  Denies PND or orthopnea.  Endorses leg swelling on presentation to the hospital, but states it seems to have improved.  Data: K 5.6 >> 4.6; SCr 2.6 >> 2.5 ALT 25 BNP 2423.4 Hgb 13.7, SARS-CoV-2 neg Head CT: nothing acute CXR: CM w findings c/w CHF EKG: AFib, HR 82, LAD, TW inversion V6 (no change from prior)   Past Medical History:  Diagnosis  Date  . AAA (abdominal aortic aneurysm) ([place])    a. 08/2015 Abd U/S: 2.7 x 2.9 x 3.2 cm infrarenal AAA.  . Bradycardia    a. Requiring discontinuation of use of BB/CCB  . Cardiac arrest - ventricular fibrillation    a. 03/2012 in setting of hypokalemia (prolonged hosp with VDRF, tracheobronchitis, ARF, shock liver, PAF, AMS felt secondary to post-anoxic encephalopathy/shock).  . Carotid artery stenosis    a. 01/2011 - 40-59% bilateral stenosis;  b. 06/2015 Carotid U/S: 40-59% bilat ICA stenosis->f/u 6 mos.  . CKD (chronic kidney disease), stage III    . Coronary artery disease    a. s/p aborted ant STEMI tx with Cypher DES to LAD 10/04 (residual at cath: D1 50%, CFX 40% and multiple dist 70%, EF 55%);   b. myoview 3/10: Ef 47%, infero-apical isch, LOW RISK - med Tx recommended;  c. 12/2012 VF Arrest/Cath: LAD 40isr, 80 apical, LCX 100 (failed PTCA), RCA nondom.  . Diabetes mellitus   . Diverticulosis 2001  . Elevated LFTs    Shock liver 03/2012  . H/O: CVA (cardiovascular accident)   . Hematemesis    a. 12/2010 felt 2/2 Mallory Weiss tear - pt could not afford colonscopy/EGD at that time so Coumadin was deferred. Coumadin initiated 03/2012 without any evidence for bleeding.  Marland Kitchen History of Ischemic Cardiomyopathy    a. EF 50-55% by echo 03/09/12 (was 30% by echo 02/24/12); b. 03/2013 Echo: EF 55-60%, mild MR, sev dil LA, mod dil RA, PASP 5mmHg.  Marland Kitchen Hypertensive heart disease    a. echo 4/12: EF 50%, asymmetric septal hypertrophy, no SAM or LVOT gradient, LAE, PASP 35  . Inguinal hernia   . 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 Mitral regurgitation    a. Mild by echo 03/2012 and 03/2013.  . Mixed Hyperlipidemia   . Peripheral vascular disease ([place])    a. h/o LE angioplasty;  b. 08/2015 Duplex: >50% RCIA, 100% REIA, >50% LEIA, elev vel in SMA, patent IVC.  Marland Kitchen Persistent atrial fibrillation ([place])    a. Dx 12/2010-->coumadin (CHA2DS2VASc = 7).  . QT prolongation     Past Surgical History:  Procedure Laterality Date  . HERNIA REPAIR    . LEFT HEART CATH N/A 12/30/2012   Procedure: LEFT HEART CATH;  Surgeon: [name], MD;  Location: [facility] CATH LAB;  Service: Cardiovascular;  Laterality: N/A;  . ORIF TIBIA & FIBULA FRACTURES  01/11/07   OPEN TX OF UNICONDYLAR PLATEAU FRACTURE, IRRIGATION/DEBRIDEMENT OF OPEN FRACTURE INCLUDING BONE, REMOVAL OF EXTERNAL FIXATOR UNDER ANESTHESIA PER DR. MICHAEL HANDY  . PERIPHERAL VASCULAR CATHETERIZATION N/A 12/21/2015   Procedure: Lower Extremity Angiography;  Surgeon: [name], MD;  Location: [facility] CV LAB;  Service: Cardiovascular;  Laterality: N/A;  . POPLITEAL ARTERY ANGIOPLASTY  01/07/07   s/p LEFT POPLITEAL ARTERY EXPLORATION AND VEIN PATCH ANGIOPLASTY, PER DR. EARLY, SECONDARY TO ISCHEMIC LEFT FOOT RELATED TO LEFT POPLITEAL ARTERY INJURY  . SKIN GRAFT    . TEE WITHOUT CARDIOVERSION N/A 08/08/2019   Procedure: TRANSESOPHAGEAL ECHOCARDIOGRAM (TEE);  Surgeon: Lelon Perla, MD;  Location: [facility] ENDOSCOPY;  Service: Cardiovascular;  Laterality: N/A;     Home Medications:  Prior to Admission medications   Medication Sig Start Date End Date Taking? Authorizing Provider  acetaminophen (TYLENOL) 325 MG tablet Take 650 mg by mouth every 6 (six) hours as needed for headache.   Yes [provider]  albuterol (VENTOLIN HFA) 108 (90 Base) MCG/ACT inhaler  Inhale 2 puffs into the lungs every 6 (six) hours as needed for wheezing or shortness of breath. 08/26/19  Yes Lelon Perla, MD  apixaban (ELIQUIS) 5 MG TABS tablet Take 1 tablet (5 mg total) by mouth 2 (two) times daily. 08/08/19  Yes Bhagat, Bhavinkumar, PA  atorvastatin (LIPITOR) 80 MG tablet Take 80 mg by mouth daily.   Yes [provider]  Ensure (ENSURE) Take 237 mLs by mouth 2 (two) times daily as needed (nutritional supplement).   Yes [provider]  hydrALAZINE (APRESOLINE) 10 MG tablet Take 1 tablet (10 mg total) by mouth 2 (two) times daily. 09/23/19 12/22/19 Yes Verta Ellen., NP  metoprolol succinate (TOPROL-XL) 100 MG 24 hr tablet Take 1 tablet (100 mg total) by mouth daily. 08/26/19  Yes Lelon Perla, MD  nitroGLYCERIN (NITROSTAT) 0.4 MG SL tablet Place 1 tablet (0.4 mg total) under the tongue every 5 (five) minutes as needed for chest pain (Up to 3 doses). 01/11/13  Yes Theora Gianotti, NP  potassium chloride SA (KLOR-CON) 20 MEQ tablet Take 20 mEq by mouth 2 (two) times daily.   Yes [provider]  torsemide (DEMADEX) 20 MG tablet Take 2  tablets (40 mg total) by mouth 2 (two) times daily. 08/14/19 11/12/19 Yes Lendon Colonel, NP  isosorbide mononitrate (IMDUR) 30 MG 24 hr tablet Take 1 tablet (30 mg total) by mouth daily. Patient not taking: Reported on 09/28/2019 06/25/19   Swayze, Ava, DO    Inpatient Medications: Scheduled Meds: . apixaban  5 mg Oral BID  . aspirin EC  81 mg Oral Daily  . atorvastatin  80 mg Oral q1800  . furosemide  80 mg Intravenous Q12H  . hydrALAZINE  10 mg Oral BID  . isosorbide mononitrate  30 mg Oral Daily  . metoprolol succinate  100 mg Oral Daily  . sodium chloride flush  3 mL Intravenous Q12H   Continuous Infusions: . sodium chloride     PRN Meds: sodium chloride, acetaminophen, albuterol, nitroGLYCERIN, sodium chloride flush  Allergies:   No Known Allergies  Social History:   Social History   Socioeconomic History  . Marital status: Married    Spouse name: Not on file  . Number of children: 4  . Years of education: Not on file  . Highest education level: Not on file  Occupational History  . 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 Smoking status: Former Smoker    Packs/day: 0.50    Years: 30.00    Pack years: 15.00    Types: Cigarettes    Quit date: 03/12/2012    Years since quitting: 7.5  . Smokeless tobacco: Never Used  Substance and Sexual Activity  . Alcohol use: No    Alcohol/week: 0.0 standard drinks  . Drug use: No    Frequency: 2.0 times per week  . Sexual activity: Not on file  Other Topics Concern  . 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 YES. 1/2 -1 PPD OF CIGARETTES    NO ETOH   Social Determinants of Health   Financial Resource Strain:   . Difficulty of Paying Living Expenses: Not on file  Food Insecurity:   . Worried About Charity fundraiser in the Last Year: Not on file  .  Ran Out of Food in the  Last Year: Not on file  Transportation Needs:   . Lack of Transportation (Medical): Not on file  . Lack of Transportation (Non-Medical): Not on file  Physical Activity:   . Days of Exercise per Week: Not on file  . Minutes of Exercise per Session: Not on file  Stress:   . Feeling of Stress : Not on file  Social Connections:   . Frequency of Communication with Friends and Family: Not on file  . Frequency of Social Gatherings with Friends and Family: Not on file  . Attends Religious Services: Not on file  . Active Member of Clubs or Organizations: Not on file  . Attends Archivist Meetings: Not on file  . Marital Status: Not on file  Intimate Partner Violence:   . Fear of Current or Ex-Partner: Not on file  . Emotionally Abused: Not on file  . Physically Abused: Not on file  . Sexually Abused: Not on file    Family History:    Family History  Problem Relation Age of Onset  . Heart disease Father        ALSO UNCLE DIED HAD CAD  . Heart failure Father   . Brain cancer Mother   . Leukemia Mother   . Sickle cell anemia Mother   . Sickle cell anemia Other      ROS:  Please see the history of present illness.   All other ROS reviewed and negative.     Physical Exam/Data:   Vitals:   09/28/19 2200 09/28/19 2239 09/29/19 0543 09/29/19 0940  BP: 108/88 112/84 (!) 117/92   Pulse:  (!) 59 78   Resp: 14 (!) 24 (!) 24   Temp:  97.7 F (36.5 C) 97.9 F (36.6 C)   TempSrc:  Oral Oral   SpO2:  98% 97% 99%  Weight:      Height:        Intake/Output Summary (Last 24 hours) at 09/29/2019 1321 Last data filed at 09/29/2019 1303 Gross per 24 hour  Intake 1080 ml  Output 1250 ml  Net -170 ml   Last 3 Weights 09/28/2019 09/23/2019 08/26/2019  Weight (lbs) 178 lb 3.2 oz 178 lb 3.2 oz 180 lb 3.2 oz  Weight (kg) 80.831 kg 80.831 kg 81.738 kg  Some encounter information is confidential and restricted. Go to Review Flowsheets activity to see all data.     Body mass index  is 21.98 kg/m.  General: Thin, audible wheezing, mild respiratory distress, but speaks without significant difficulty as a conversation goes on.   HEENT: normal Neck: no JVD at 45 degrees Cardiac: Irregular rhythm, normal rate, murmurs challenging to appreciate over respiratory sounds. Lungs: Scattered coarse rhonchi with end expiratory wheezing. Abd: soft, nontender, nondistended Ext: no edema Musculoskeletal:  No deformities, BUE and BLE strength normal and equal Skin: warm and dry  Neuro:  CNs 2-12 intact, no focal abnormalities noted Psych:  Normal affect   EKG:  The EKG was personally reviewed and demonstrates: Normal sinus rhythm, anterior infarct, lateral T wave inversions, not significantly different from the prior ECG. Telemetry:  Telemetry was personally reviewed and demonstrates: Atrial fibrillation occasional PVCs versus aberrancy  Relevant CV Studies:  Echocardiogram 09/29/2019  1. Left ventricular ejection fraction, by visual estimation, is 25 to 30%. The left ventricle has severely decreased function. There is mildly increased left ventricular hypertrophy.  2. Left ventricular diastolic function could not be evaluated.  3. Severely dilated left  ventricular internal cavity size.  4. The left ventricle demonstrates global hypokinesis.  5. Global right ventricle has severely reduced systolic function.The right ventricular size is mildly enlarged.  6. Left atrial size was severely dilated.  7. The interatrial septum is aneurysmal.  8. Right atrial size was severely dilated.  9. The mitral valve is normal in structure. Moderate mitral valve regurgitation. No evidence of mitral stenosis. 10. The tricuspid valve is normal in structure. Tricuspid valve regurgitation is mild. 11. The aortic valve is tricuspid. Aortic valve regurgitation is mild. Mild to moderate aortic valve sclerosis/calcification without any evidence of aortic stenosis. 12. The pulmonic valve was normal in  structure. Pulmonic valve regurgitation is mild. 13. Moderately elevated pulmonary artery systolic pressure. 14. The inferior vena cava is dilated in size with <50% respiratory variability, suggesting right atrial pressure of 15 mmHg. 15. Severe global reduction in LV systolic function; mild LVH; 4 chamber enlargement; severe RV dysfunction; mild AI; moderate MR; mild TR; moderate pulmonary hypertension.   Laboratory Data:  High Sensitivity Troponin:  No results for input(s): TROPONINIHS in the last 720 hours.   Chemistry Recent Labs  Lab 09/23/19 1007 09/23/19 1007 09/28/19 1829 09/28/19 2342 09/29/19 0506  NA 140  --  133*  --  135  K 4.5   < > 5.6* 5.5* 4.6  CL 101  --  99  --  101  CO2 23  --  23  --  23  GLUCOSE 79  --  105*  --  119*  BUN 39*  --  49*  --  51*  CREATININE 1.83*  --  2.60*  --  2.50*  CALCIUM 9.5  --  9.2  --  8.7*  GFRNONAA 35*  --  23*  --  24*  GFRAA 41*  --  27*  --  28*  ANIONGAP  --   --  11  --  11   < > = values in this interval not displayed.    Recent Labs  Lab 09/28/19 1829  PROT 8.0  ALBUMIN 3.8  AST 32  ALT 25  ALKPHOS 186*  BILITOT 2.9*   Hematology Recent Labs  Lab 09/28/19 1829 09/29/19 0506  WBC 5.8 6.2  RBC 5.74 5.18  HGB 15.3 13.7  HCT 43.7 39.3  MCV 76.1* 75.9*  MCH 26.7 26.4  MCHC 35.0 34.9  RDW 17.0* 16.6*  PLT 185 147*   BNP Recent Labs  Lab 09/28/19 1829  BNP 2,423.4*    DDimer No results for input(s): DDIMER in the last 168 hours.   Radiology/Studies:  CT Head Wo Contrast  Result Date: 09/28/2019 CLINICAL DATA:  Pain status post fall. EXAM: CT HEAD WITHOUT CONTRAST CT CERVICAL SPINE WITHOUT CONTRAST TECHNIQUE: Multidetector CT imaging of the head and cervical spine was performed following the standard protocol without intravenous contrast. Multiplanar CT image reconstructions of the cervical spine were also generated. COMPARISON:  CT head dated 01/02/2013.  MRI dated Jan 04, 2013. FINDINGS: CT HEAD  FINDINGS Brain: No evidence of acute infarction, hemorrhage, hydrocephalus, extra-axial collection or mass lesion/mass effect. Mild age related atrophy is noted. Vascular: No hyperdense vessel or unexpected calcification. Skull: Normal. Negative for fracture or focal lesion. Sinuses/Orbits: No acute finding. Other: None. CT CERVICAL SPINE FINDINGS Alignment: Normal. Skull base and vertebrae: There is no acute displaced fracture. No dislocation. Is height loss of several cervical vertebral bodies, most evident at the C4-C5 levels, likely chronic. Soft tissues and spinal canal: No prevertebral fluid or  swelling. No visible canal hematoma. Disc levels:  The disc heights are relatively well preserved. Upper chest: Negative. Other: None IMPRESSION: 1. No acute intracranial abnormality. 2. No acute cervical spine fracture. Electronically Signed   By: Constance Holster M.D.   On: 09/28/2019 19:19   CT Cervical Spine Wo Contrast  Result Date: 09/28/2019 CLINICAL DATA:  Pain status post fall. EXAM: CT HEAD WITHOUT CONTRAST CT CERVICAL SPINE WITHOUT CONTRAST TECHNIQUE: Multidetector CT imaging of the head and cervical spine was performed following the standard protocol without intravenous contrast. Multiplanar CT image reconstructions of the cervical spine were also generated. COMPARISON:  CT head dated 01/02/2013.  MRI dated Jan 04, 2013. FINDINGS: CT HEAD FINDINGS Brain: No evidence of acute infarction, hemorrhage, hydrocephalus, extra-axial collection or mass lesion/mass effect. Mild age related atrophy is noted. Vascular: No hyperdense vessel or unexpected calcification. Skull: Normal. Negative for fracture or focal lesion. Sinuses/Orbits: No acute finding. Other: None. CT CERVICAL SPINE FINDINGS Alignment: Normal. Skull base and vertebrae: There is no acute displaced fracture. No dislocation. Is height loss of several cervical vertebral bodies, most evident at the C4-C5 levels, likely chronic. Soft tissues and spinal  canal: No prevertebral fluid or swelling. No visible canal hematoma. Disc levels:  The disc heights are relatively well preserved. Upper chest: Negative. Other: None IMPRESSION: 1. No acute intracranial abnormality. 2. No acute cervical spine fracture. Electronically Signed   By: Constance Holster M.D.   On: 09/28/2019 19:19   DG Chest Port 1 View  Result Date: 09/28/2019 CLINICAL DATA:  Shortness of breath EXAM: PORTABLE CHEST 1 VIEW COMPARISON:  06/22/2019 FINDINGS: Heart size is significantly enlarged. The pulmonary arteries are dilated. Prominent interstitial lung markings. Aortic calcifications are noted. There are trace to small bilateral pleural effusions. There is no pneumothorax. IMPRESSION: 1. Cardiomegaly with findings concerning for congestive heart failure. 2. Dilated pulmonary arteries which can be seen in patients with elevated pulmonary artery pressure. 3.  Choose Electronically Signed   By: Constance Holster M.D.   On: 09/28/2019 18:34   ECHOCARDIOGRAM COMPLETE  Result Date: 09/29/2019   ECHOCARDIOGRAM REPORT   Patient Name:   KHADAR MONGER Date of Exam: 09/29/2019 Medical Rec #:  [id number]         Height:       75.5 in Accession #:    [id number]        Weight:       178.2 lb Date of Birth:  [birth year]         BSA:          2.10 m Patient Age:    27 years          BP:           117/92 mmHg Patient Gender: M                 HR:           94 bpm. Exam Location:  Inpatient Procedure: 2D Echo Indications:    Cardiomyopathy-Unsepcified  History:        Patient has no prior history of Echocardiogram examinations and                 Patient has prior history of Echocardiogram examinations, most                 recent 08/08/2019. CHF, Arrythmias:Atrial Fibrillation,                 Signs/Symptoms:Shortness of Breath; Risk Factors:Former  Smoker.                 Ischemic cardiomyopathy. CKD.  Sonographer:    Clayton Lefort RDCS (AE) Referring Phys: [id number] [place]  Sonographer Comments:  Image acquisition challenging due to respiratory motion. IMPRESSIONS  1. Left ventricular ejection fraction, by visual estimation, is 25 to 30%. The left ventricle has severely decreased function. There is mildly increased left ventricular hypertrophy.  2. Left ventricular diastolic function could not be evaluated.  3. Severely dilated left ventricular internal cavity size.  4. The left ventricle demonstrates global hypokinesis.  5. Global right ventricle has severely reduced systolic function.The right ventricular size is mildly enlarged.  6. Left atrial size was severely dilated.  7. The interatrial septum is aneurysmal.  8. Right atrial size was severely dilated.  9. The mitral valve is normal in structure. Moderate mitral valve regurgitation. No evidence of mitral stenosis. 10. The tricuspid valve is normal in structure. Tricuspid valve regurgitation is mild. 11. The aortic valve is tricuspid. Aortic valve regurgitation is mild. Mild to moderate aortic valve sclerosis/calcification without any evidence of aortic stenosis. 12. The pulmonic valve was normal in structure. Pulmonic valve regurgitation is mild. 13. Moderately elevated pulmonary artery systolic pressure. 14. The inferior vena cava is dilated in size with <50% respiratory variability, suggesting right atrial pressure of 15 mmHg. 15. Severe global reduction in LV systolic function; mild LVH; 4 chamber enlargement; severe RV dysfunction; mild AI; moderate MR; mild TR; moderate pulmonary hypertension. FINDINGS  Left Ventricle: Left ventricular ejection fraction, by visual estimation, is 25 to 30%. The left ventricle has severely decreased function. The left ventricle demonstrates global hypokinesis. The left ventricular internal cavity size was severely dilated left ventricle. There is mildly increased left ventricular hypertrophy. The left ventricular diastology could not be evaluated due to atrial fibrillation. Left ventricular diastolic function could  not be evaluated. Right Ventricle: The right ventricular size is mildly enlarged.Global RV systolic function is has severely reduced systolic function. The tricuspid regurgitant velocity is 2.66 m/s, and with an assumed right atrial pressure of 15 mmHg, the estimated right ventricular systolic pressure is moderately elevated at 43.3 mmHg. Left Atrium: Left atrial size was severely dilated. Right Atrium: Right atrial size was severely dilated Pericardium: There is no evidence of pericardial effusion. Mitral Valve: The mitral valve is normal in structure. There is mild thickening of the mitral valve leaflet(s). Moderate mitral valve regurgitation. No evidence of mitral valve stenosis by observation. Tricuspid Valve: The tricuspid valve is normal in structure. Tricuspid valve regurgitation is mild. Aortic Valve: The aortic valve is tricuspid. Aortic valve regurgitation is mild. Aortic regurgitation PHT measures 643 msec. Mild to moderate aortic valve sclerosis/calcification is present, without any evidence of aortic stenosis. Pulmonic Valve: The pulmonic valve was normal in structure. Pulmonic valve regurgitation is mild. Pulmonic regurgitation is mild. Aorta: The aortic root is normal in size and structure. Venous: The inferior vena cava is dilated in size with less than 50% respiratory variability, suggesting right atrial pressure of 15 mmHg. IAS/Shunts: An The interatrial septum is aneurysmal is seen. Additional Comments: Severe global reduction in LV systolic function; mild LVH; 4 chamber enlargement; severe RV dysfunction; mild AI; moderate MR; mild TR; moderate pulmonary hypertension.  LEFT VENTRICLE PLAX 2D LVIDd:         6.50 cm LVIDs:         6.00 cm LV PW:         1.10 cm LV IVS:  1.20 cm LVOT diam:     2.00 cm  3D Volume EF: LV SV:         36 ml    3D EF:        34 % LV SV Index:   17.40    LV EDV:       233 ml LVOT Area:     3.14 cm LV ESV:       155 ml                         LV SV:        78 ml  RIGHT VENTRICLE            IVC RV Basal diam:  4.10 cm    IVC diam: 2.60 cm RV S prime:     9.00 cm/s TAPSE (M-mode): 1.6 cm LEFT ATRIUM              Index        RIGHT ATRIUM           Index LA diam:        6.70 cm  3.19 cm/m   RA Area:     35.80 cm LA Vol (A2C):   261.0 ml 124.30 ml/m RA Volume:   128.00 ml 60.96 ml/m LA Vol (A4C):   272.0 ml 129.54 ml/m LA Biplane Vol: 267.0 ml 127.16 ml/m  AORTIC VALVE AI PHT:      643 msec  AORTA Ao Root diam: 2.90 cm Ao Asc diam:  3.20 cm MR Peak grad:    102.4 mmHg  TRICUSPID VALVE MR Mean grad:    58.0 mmHg   TR Peak grad:   28.3 mmHg MR Vmax:         506.00 cm/s TR Vmax:        328.00 cm/s MR Vmean:        350.0 cm/s MR PISA:         5.09 cm    SHUNTS MR PISA Eff ROA: 31 mm      Systemic Diam: 2.00 cm MR PISA Radius:  0.90 cm  Kirk Ruths MD Electronically signed by Kirk Ruths MD Signature Date/Time: 09/29/2019/10:17:51 AM    Final          Assessment and Plan:   1.  Acute on chronic systolic congestive heart failure - I/O: -170 mL at this point in the admission with 1.5 L urine output since admission. 80.8 kg today, at last office visit 81.7 kg. -While he is not net negative for the admission, he does appear euvolemic at this time.  There is no lower extremity edema, no JVP.  Chest x-ray from admission reviewed does demonstrate cardiomegaly compared to the prior.  He received 1 dose of IV Lasix this morning and his nurse [name] tells me he did not have significant output with that.  It is likely can be transitioned back to his home torsemide tomorrow.  He had elevated BNP, but again by exam volume status is not impressive.  Suspicious for pulmonary disease playing a role in shortness of breath given breath sounds, end expiratory wheezing, response to nebulizer treatments. RV severely dysfunctional on echo, not significantly different than echo performed in October.  -We will hold the evening dose of Lasix, and reassess in the morning if he needs  transition back to oral torsemide versus additional IV doses of Lasix tomorrow. -Recommend evaluation for primary pulmonary process if not previously  performed.  2.  Syncope -  No significant change from prior echo in 08/08/2019 - His episode was not with standing and occurred while he was sitting on a stool in his bedroom.  He lost control of his bowels.  Unclear the etiology of syncope, with history of VF arrest would recommend telemetry while in hospital, and cardiac monitor upon hospital dismissal to evaluate for malignant arrhythmia.  3.  Acute on chronic kidney disease Cr 2.5 today, baseline from November '20 appears to be 1.3-1.5 - He appears euvolemic on exam, complex picture given his elevated BNP.   -Recently had torsemide prescribed as an outpatient, but had signs of decompensated heart failure on exam suggesting he was not over diuresed as the etiology of his renal dysfunction -Consider evaluation with nephrology for acute on chronic renal failure.  4.  Coronary artery disease - no change in ECG since 09/23/2019 Stable, no concerning trop elevation. Continue home meds of : Apixaban 5 mg twice daily, aspirin 81 mg daily, atorvastatin 80 mg daily, hydralazine 10 mg twice daily, metoprolol succinate 100 mg daily  5.  Permanent atrial fibrillation Rate controlled, continue meds of apixaban 5 mg twice daily and metoprolol succinate 100 mg daily.  6.  Mitral regurgitation  Stable, moderate  7.  Hyperkalemia Improved since admission, caution if potassium drops given previous VF arrest in the setting of hypokalemia.       For questions or updates, please contact [place] Please consult [email or web address] for contact info under    Signed, Elouise Munroe, MD  09/29/2019 1:21 PM

## 2019-09-29 NOTE — Plan of Care (Signed)
  Problem: Clinical Measurements: Goal: Respiratory complications will improve Outcome: Progressing   Problem: Clinical Measurements: Goal: Cardiovascular complication will be avoided Outcome: Progressing   Problem: Activity: Goal: Risk for activity intolerance will decrease Outcome: Progressing   Problem: Nutrition: Goal: Adequate nutrition will be maintained Outcome: Progressing   Problem: Elimination: Goal: Will not experience complications related to bowel motility Outcome: Progressing   Problem: Coping: Goal: Level of anxiety will decrease Outcome: Progressing

## 2019-09-29 NOTE — Progress Notes (Signed)
PROGRESS NOTE    NAPOLEON MONACELLI  YIA:[id number] DOB: 17-Jan-1941 DOA: 09/28/2019 PCP: Kelton Pillar, MD    Brief Narrative:  79 year old gentleman with extensive cardiac history including chronic systolic congestive heart failure with known ejection fraction of 30%, chronic A. fib on anticoagulation with Eliquis, previous V. fib arrest in 2014, ischemic cardiomyopathy, coronary artery disease, peripheral vascular disease and underlying COPD.  He is managed outpatient, recently increased dose of torsemide, despite that continued to have significant exertional dyspnea and orthopnea.  On the day of admission, he had an episode of severe shortness of breath on walking to the bathroom and near syncopal episode so EMS was called and he was brought to ER. In the emergency room 87% on room air.  Patient clinically with congestive heart failure.   Assessment & Plan:   Active Problems:   CHF (congestive heart failure), NYHA class IV, acute, systolic (HCC)  Acute on chronic congestive heart failure, systolic: With significant symptoms. On torsemide 40 mg twice a day at home, started on Lasix 40 mg IV twice a day, increase dose to 80 mg twice a day. Patient is already on hydralazine, nitrates.  Intake and output monitoring.  Oxygen to keep saturations more than 90%. Repeat echocardiogram today.  Called and discussed with cardiology for follow-up. Patient is on beta-blockers.  Hyperkalemia: Patient on potassium supplements.  Kayexalate given.  Normalized.  We will recheck tomorrow morning.  Acute on chronic kidney disease stage III: Worsening serum creatinine probably due to acute congestive heart failure.  Increasing dose of Lasix.  Recheck levels to ensure stabilization.  Chronic A. fib: Rate controlled.  Patient on Eliquis and beta-blockers.  Hypertension: Blood pressures fairly stable.  Chronic bronchitis: With active wheezing.  We will add bronchodilator.  Also start chest  physiotherapy.  Called and discussed with cardiology.   DVT prophylaxis: Therapeutic on Eliquis Code Status: Full code Family Communication: Patient stated he is communicating with family. Disposition Plan: patient is from home.. Anticipated DC to home, Barriers to discharge active treatment with IV Lasix and monitoring.   Consultants:   Cardiology  Procedures:   None  Antimicrobials:   None   Subjective: Patient seen and examined.  No overnight events.  Wheezing and coughing.  Complains of leg pain.  He does have peripheral vascular disease.  Objective: Vitals:   09/28/19 2200 09/28/19 2239 09/29/19 0543 09/29/19 0940  BP: 108/88 112/84 (!) 117/92   Pulse:  (!) 59 78   Resp: 14 (!) 24 (!) 24   Temp:  97.7 F (36.5 C) 97.9 F (36.6 C)   TempSrc:  Oral Oral   SpO2:  98% 97% 99%  Weight:      Height:        Intake/Output Summary (Last 24 hours) at 09/29/2019 1142 Last data filed at 09/29/2019 1102 Gross per 24 hour  Intake 600 ml  Output 850 ml  Net -250 ml   Filed Weights   09/28/19 1731  Weight: 80.8 kg    Examination:  General exam: Appears calm and comfortable, on 2 L oxygen. Respiratory system: Bilateral extensive expiratory wheezes and basal crackles. Cardiovascular system: S1 & S2 heard, irregularly irregular.  Shiny bilateral legs with 1+ pedal edema.  He also has some tightness of the calf muscles. Gastrointestinal system: Abdomen is nondistended, soft and nontender. . Central nervous system: Alert and oriented. No focal neurological deficits. Extremities: Symmetric 5 x 5 power. Skin: No rashes, lesions or ulcers Psychiatry: Judgement and insight appear normal.  Mood & affect appropriate.     Data Reviewed: I have personally reviewed following labs and imaging studies  CBC: Recent Labs  Lab 09/28/19 1829 09/29/19 0506  WBC 5.8 6.2  NEUTROABS 2.8  --   HGB 15.3 13.7  HCT 43.7 39.3  MCV 76.1* 75.9*  PLT 185 956*   Basic Metabolic  Panel: Recent Labs  Lab 09/23/19 1007 09/28/19 1829 09/28/19 2342 09/29/19 0506  NA 140 133*  --  135  K 4.5 5.6* 5.5* 4.6  CL 101 99  --  101  CO2 23 23  --  23  GLUCOSE 79 105*  --  119*  BUN 39* 49*  --  51*  CREATININE 1.83* 2.60*  --  2.50*  CALCIUM 9.5 9.2  --  8.7*   GFR: Estimated Creatinine Clearance: 28.7 mL/min (A) (by C-G formula based on SCr of 2.5 mg/dL (H)). Liver Function Tests: Recent Labs  Lab 09/28/19 1829  AST 32  ALT 25  ALKPHOS 186*  BILITOT 2.9*  PROT 8.0  ALBUMIN 3.8   No results for input(s): LIPASE, AMYLASE in the last 168 hours. No results for input(s): AMMONIA in the last 168 hours. Coagulation Profile: No results for input(s): INR, PROTIME in the last 168 hours. Cardiac Enzymes: No results for input(s): CKTOTAL, CKMB, CKMBINDEX, TROPONINI in the last 168 hours. BNP (last 3 results) Recent Labs    08/15/19 0949  PROBNP 16,511*   HbA1C: No results for input(s): HGBA1C in the last 72 hours. CBG: No results for input(s): GLUCAP in the last 168 hours. Lipid Profile: No results for input(s): CHOL, HDL, LDLCALC, TRIG, CHOLHDL, LDLDIRECT in the last 72 hours. Thyroid Function Tests: No results for input(s): TSH, T4TOTAL, FREET4, T3FREE, THYROIDAB in the last 72 hours. Anemia Panel: No results for input(s): VITAMINB12, FOLATE, FERRITIN, TIBC, IRON, RETICCTPCT in the last 72 hours. Sepsis Labs: No results for input(s): PROCALCITON, LATICACIDVEN in the last 168 hours.  Recent Results (from the past 240 hour(s))  Respiratory Panel by RT PCR (Flu A&B, Covid) - Nasopharyngeal Swab     Status: None   Collection Time: 09/28/19  6:29 PM   Specimen: Nasopharyngeal Swab  Result Value Ref Range Status   SARS Coronavirus 2 by RT PCR NEGATIVE NEGATIVE Final    Comment: (NOTE) SARS-CoV-2 target nucleic acids are NOT DETECTED. The SARS-CoV-2 RNA is generally detectable in upper respiratoy specimens during the acute phase of infection. The  lowest concentration of SARS-CoV-2 viral copies this assay can detect is 131 copies/mL. A negative result does not preclude SARS-Cov-2 infection and should not be used as the sole basis for treatment or other patient management decisions. A negative result may occur with  improper specimen collection/handling, submission of specimen other than nasopharyngeal swab, presence of viral mutation(s) within the areas targeted by this assay, and inadequate number of viral copies (<131 copies/mL). A negative result must be combined with clinical observations, patient history, and epidemiological information. The expected result is Negative. Fact Sheet for Patients:  PinkCheek.be Fact Sheet for Healthcare Providers:  GravelBags.it This test is not yet ap proved or cleared by the [place] FDA and  has been authorized for detection and/or diagnosis of SARS-CoV-2 by FDA under an Emergency Use Authorization (EUA). This EUA will remain  in effect (meaning this test can be used) for the duration of the COVID-19 declaration under Section 564(b)(1) of the Act, 21 U.S.C. section 360bbb-3(b)(1), unless the authorization is terminated or revoked sooner.    Influenza  A by PCR NEGATIVE NEGATIVE Final   Influenza B by PCR NEGATIVE NEGATIVE Final    Comment: (NOTE) The Xpert Xpress SARS-CoV-2/FLU/RSV assay is intended as an aid in  the diagnosis of influenza from Nasopharyngeal swab specimens and  should not be used as a sole basis for treatment. Nasal washings and  aspirates are unacceptable for Xpert Xpress SARS-CoV-2/FLU/RSV  testing. Fact Sheet for Patients: PinkCheek.be Fact Sheet for Healthcare Providers: GravelBags.it This test is not yet approved or cleared by the [place] FDA and  has been authorized for detection and/or diagnosis of SARS-CoV-2 by  FDA under an Emergency  Use Authorization (EUA). This EUA will remain  in effect (meaning this test can be used) for the duration of the  Covid-19 declaration under Section 564(b)(1) of the Act, 21  U.S.C. section 360bbb-3(b)(1), unless the authorization is  terminated or revoked. Performed at [facility], [place] [address], [place], [place] [address]          Radiology Studies: CT Head Wo Contrast  Result Date: 09/28/2019 CLINICAL DATA:  Pain status post fall. EXAM: CT HEAD WITHOUT CONTRAST CT CERVICAL SPINE WITHOUT CONTRAST TECHNIQUE: Multidetector CT imaging of the head and cervical spine was performed following the standard protocol without intravenous contrast. Multiplanar CT image reconstructions of the cervical spine were also generated. COMPARISON:  CT head dated 01/02/2013.  MRI dated Jan 04, 2013. FINDINGS: CT HEAD FINDINGS Brain: No evidence of acute infarction, hemorrhage, hydrocephalus, extra-axial collection or mass lesion/mass effect. Mild age related atrophy is noted. Vascular: No hyperdense vessel or unexpected calcification. Skull: Normal. Negative for fracture or focal lesion. Sinuses/Orbits: No acute finding. Other: None. CT CERVICAL SPINE FINDINGS Alignment: Normal. Skull base and vertebrae: There is no acute displaced fracture. No dislocation. Is height loss of several cervical vertebral bodies, most evident at the C4-C5 levels, likely chronic. Soft tissues and spinal canal: No prevertebral fluid or swelling. No visible canal hematoma. Disc levels:  The disc heights are relatively well preserved. Upper chest: Negative. Other: None IMPRESSION: 1. No acute intracranial abnormality. 2. No acute cervical spine fracture. Electronically Signed   By: Constance Holster M.D.   On: 09/28/2019 19:19   CT Cervical Spine Wo Contrast  Result Date: 09/28/2019 CLINICAL DATA:  Pain status post fall. EXAM: CT HEAD WITHOUT CONTRAST CT CERVICAL SPINE WITHOUT CONTRAST TECHNIQUE: Multidetector CT  imaging of the head and cervical spine was performed following the standard protocol without intravenous contrast. Multiplanar CT image reconstructions of the cervical spine were also generated. COMPARISON:  CT head dated 01/02/2013.  MRI dated Jan 04, 2013. FINDINGS: CT HEAD FINDINGS Brain: No evidence of acute infarction, hemorrhage, hydrocephalus, extra-axial collection or mass lesion/mass effect. Mild age related atrophy is noted. Vascular: No hyperdense vessel or unexpected calcification. Skull: Normal. Negative for fracture or focal lesion. Sinuses/Orbits: No acute finding. Other: None. CT CERVICAL SPINE FINDINGS Alignment: Normal. Skull base and vertebrae: There is no acute displaced fracture. No dislocation. Is height loss of several cervical vertebral bodies, most evident at the C4-C5 levels, likely chronic. Soft tissues and spinal canal: No prevertebral fluid or swelling. No visible canal hematoma. Disc levels:  The disc heights are relatively well preserved. Upper chest: Negative. Other: None IMPRESSION: 1. No acute intracranial abnormality. 2. No acute cervical spine fracture. Electronically Signed   By: Constance Holster M.D.   On: 09/28/2019 19:19   DG Chest Port 1 View  Result Date: 09/28/2019 CLINICAL DATA:  Shortness of breath EXAM: PORTABLE CHEST  1 VIEW COMPARISON:  06/22/2019 FINDINGS: Heart size is significantly enlarged. The pulmonary arteries are dilated. Prominent interstitial lung markings. Aortic calcifications are noted. There are trace to small bilateral pleural effusions. There is no pneumothorax. IMPRESSION: 1. Cardiomegaly with findings concerning for congestive heart failure. 2. Dilated pulmonary arteries which can be seen in patients with elevated pulmonary artery pressure. 3.  Choose Electronically Signed   By: Constance Holster M.D.   On: 09/28/2019 18:34   ECHOCARDIOGRAM COMPLETE  Result Date: 09/29/2019   ECHOCARDIOGRAM REPORT   Patient Name:   LOWERY PAULLIN Date of  Exam: 09/29/2019 Medical Rec #:  [id number]         Height:       75.5 in Accession #:    [id number]        Weight:       178.2 lb Date of Birth:  09-13-1940         BSA:          2.10 m Patient Age:    89 years          BP:           117/92 mmHg Patient Gender: M                 HR:           94 bpm. Exam Location:  Inpatient Procedure: 2D Echo Indications:    Cardiomyopathy-Unsepcified  History:        Patient has no prior history of Echocardiogram examinations and                 Patient has prior history of Echocardiogram examinations, most                 recent 08/08/2019. CHF, Arrythmias:Atrial Fibrillation,                 Signs/Symptoms:Shortness of Breath; Risk Factors:Former Smoker.                 Ischemic cardiomyopathy. CKD.  Sonographer:    Clayton Lefort RDCS (AE) Referring Phys: [id number] [place]  Sonographer Comments: Image acquisition challenging due to respiratory motion. IMPRESSIONS  1. Left ventricular ejection fraction, by visual estimation, is 25 to 30%. The left ventricle has severely decreased function. There is mildly increased left ventricular hypertrophy.  2. Left ventricular diastolic function could not be evaluated.  3. Severely dilated left ventricular internal cavity size.  4. The left ventricle demonstrates global hypokinesis.  5. Global right ventricle has severely reduced systolic function.The right ventricular size is mildly enlarged.  6. Left atrial size was severely dilated.  7. The interatrial septum is aneurysmal.  8. Right atrial size was severely dilated.  9. The mitral valve is normal in structure. Moderate mitral valve regurgitation. No evidence of mitral stenosis. 10. The tricuspid valve is normal in structure. Tricuspid valve regurgitation is mild. 11. The aortic valve is tricuspid. Aortic valve regurgitation is mild. Mild to moderate aortic valve sclerosis/calcification without any evidence of aortic stenosis. 12. The pulmonic valve was normal in structure. Pulmonic  valve regurgitation is mild. 13. Moderately elevated pulmonary artery systolic pressure. 14. The inferior vena cava is dilated in size with <50% respiratory variability, suggesting right atrial pressure of 15 mmHg. 15. Severe global reduction in LV systolic function; mild LVH; 4 chamber enlargement; severe RV dysfunction; mild AI; moderate MR; mild TR; moderate pulmonary hypertension. FINDINGS  Left Ventricle: Left ventricular ejection fraction, by visual estimation,  is 25 to 30%. The left ventricle has severely decreased function. The left ventricle demonstrates global hypokinesis. The left ventricular internal cavity size was severely dilated left ventricle. There is mildly increased left ventricular hypertrophy. The left ventricular diastology could not be evaluated due to atrial fibrillation. Left ventricular diastolic function could not be evaluated. Right Ventricle: The right ventricular size is mildly enlarged.Global RV systolic function is has severely reduced systolic function. The tricuspid regurgitant velocity is 2.66 m/s, and with an assumed right atrial pressure of 15 mmHg, the estimated right ventricular systolic pressure is moderately elevated at 43.3 mmHg. Left Atrium: Left atrial size was severely dilated. Right Atrium: Right atrial size was severely dilated Pericardium: There is no evidence of pericardial effusion. Mitral Valve: The mitral valve is normal in structure. There is mild thickening of the mitral valve leaflet(s). Moderate mitral valve regurgitation. No evidence of mitral valve stenosis by observation. Tricuspid Valve: The tricuspid valve is normal in structure. Tricuspid valve regurgitation is mild. Aortic Valve: The aortic valve is tricuspid. Aortic valve regurgitation is mild. Aortic regurgitation PHT measures 643 msec. Mild to moderate aortic valve sclerosis/calcification is present, without any evidence of aortic stenosis. Pulmonic Valve: The pulmonic valve was normal in structure.  Pulmonic valve regurgitation is mild. Pulmonic regurgitation is mild. Aorta: The aortic root is normal in size and structure. Venous: The inferior vena cava is dilated in size with less than 50% respiratory variability, suggesting right atrial pressure of 15 mmHg. IAS/Shunts: An The interatrial septum is aneurysmal is seen. Additional Comments: Severe global reduction in LV systolic function; mild LVH; 4 chamber enlargement; severe RV dysfunction; mild AI; moderate MR; mild TR; moderate pulmonary hypertension.  LEFT VENTRICLE PLAX 2D LVIDd:         6.50 cm LVIDs:         6.00 cm LV PW:         1.10 cm LV IVS:        1.20 cm LVOT diam:     2.00 cm  3D Volume EF: LV SV:         36 ml    3D EF:        34 % LV SV Index:   17.40    LV EDV:       233 ml LVOT Area:     3.14 cm LV ESV:       155 ml                         LV SV:        78 ml RIGHT VENTRICLE            IVC RV Basal diam:  4.10 cm    IVC diam: 2.60 cm RV S prime:     9.00 cm/s TAPSE (M-mode): 1.6 cm LEFT ATRIUM              Index        RIGHT ATRIUM           Index LA diam:        6.70 cm  3.19 cm/m   RA Area:     35.80 cm LA Vol (A2C):   261.0 ml 124.30 ml/m RA Volume:   128.00 ml 60.96 ml/m LA Vol (A4C):   272.0 ml 129.54 ml/m LA Biplane Vol: 267.0 ml 127.16 ml/m  AORTIC VALVE AI PHT:      643 msec  AORTA Ao Root diam: 2.90 cm  Ao Asc diam:  3.20 cm MR Peak grad:    102.4 mmHg  TRICUSPID VALVE MR Mean grad:    58.0 mmHg   TR Peak grad:   28.3 mmHg MR Vmax:         506.00 cm/s TR Vmax:        328.00 cm/s MR Vmean:        350.0 cm/s MR PISA:         5.09 cm    SHUNTS MR PISA Eff ROA: 31 mm      Systemic Diam: 2.00 cm MR PISA Radius:  0.90 cm  Kirk Ruths MD Electronically signed by Kirk Ruths MD Signature Date/Time: 09/29/2019/10:17:51 AM    Final         Scheduled Meds: . apixaban  5 mg Oral BID  . aspirin EC  81 mg Oral Daily  . atorvastatin  80 mg Oral q1800  . furosemide  80 mg Intravenous Q12H  . hydrALAZINE  10 mg Oral BID  .  isosorbide mononitrate  30 mg Oral Daily  . metoprolol succinate  100 mg Oral Daily  . sodium chloride flush  3 mL Intravenous Q12H   Continuous Infusions: . sodium chloride       LOS: 1 day    Time spent: 30 minutes    Barb Merino, MD [facility] Pager [phone]

## 2019-09-30 ENCOUNTER — Inpatient Hospital Stay (HOSPITAL_COMMUNITY): Payer: Medicare HMO

## 2019-09-30 DIAGNOSIS — I493 Ventricular premature depolarization: Secondary | ICD-10-CM

## 2019-09-30 DIAGNOSIS — I4821 Permanent atrial fibrillation: Secondary | ICD-10-CM

## 2019-09-30 DIAGNOSIS — I5021 Acute systolic (congestive) heart failure: Secondary | ICD-10-CM

## 2019-09-30 LAB — PROTIME-INR
INR: 2.5 — ABNORMAL HIGH (ref 0.8–1.2)
Prothrombin Time: 26.6 seconds — ABNORMAL HIGH (ref 11.4–15.2)

## 2019-09-30 LAB — BASIC METABOLIC PANEL
Anion gap: 10 (ref 5–15)
BUN: 51 mg/dL — ABNORMAL HIGH (ref 8–23)
CO2: 25 mmol/L (ref 22–32)
Calcium: 8.6 mg/dL — ABNORMAL LOW (ref 8.9–10.3)
Chloride: 98 mmol/L (ref 98–111)
Creatinine, Ser: 2.48 mg/dL — ABNORMAL HIGH (ref 0.61–1.24)
GFR calc Af Amer: 28 mL/min — ABNORMAL LOW (ref >60)
GFR calc non Af Amer: 24 mL/min — ABNORMAL LOW (ref >60)
Glucose, Bld: 174 mg/dL — ABNORMAL HIGH (ref 70–99)
Potassium: 3.9 mmol/L (ref 3.5–5.1)
Sodium: 133 mmol/L — ABNORMAL LOW (ref 135–145)

## 2019-09-30 LAB — PHOSPHORUS: Phosphorus: 5.4 mg/dL — ABNORMAL HIGH (ref 2.5–4.6)

## 2019-09-30 LAB — APTT: aPTT: 35 seconds (ref 24–36)

## 2019-09-30 LAB — MAGNESIUM: Magnesium: 2.3 mg/dL (ref 1.7–2.4)

## 2019-09-30 LAB — HEPARIN LEVEL (UNFRACTIONATED): Heparin Unfractionated: >2.2 IU/mL — ABNORMAL HIGH (ref 0.30–0.70)

## 2019-09-30 MED ORDER — FUROSEMIDE 10 MG/ML IJ SOLN
120.0000 mg | Freq: Once | INTRAVENOUS | Status: AC
  Administered 2019-09-30: 120 mg via INTRAVENOUS
  Filled 2019-09-30: qty 10

## 2019-09-30 MED ORDER — HEPARIN (PORCINE) 25000 UT/250ML-% IV SOLN
1150.0000 [IU]/h | INTRAVENOUS | Status: DC
  Administered 2019-09-30: 1000 [IU]/h via INTRAVENOUS
  Administered 2019-10-02: 1150 [IU]/h via INTRAVENOUS
  Filled 2019-09-30 (×3): qty 250

## 2019-09-30 MED ORDER — CHLORHEXIDINE GLUCONATE CLOTH 2 % EX PADS
6.0000 | MEDICATED_PAD | Freq: Every day | CUTANEOUS | Status: DC
  Administered 2019-09-30: 6 via TOPICAL

## 2019-09-30 MED ORDER — MOMETASONE FURO-FORMOTEROL FUM 100-5 MCG/ACT IN AERO
2.0000 | INHALATION_SPRAY | Freq: Two times a day (BID) | RESPIRATORY_TRACT | Status: DC
  Administered 2019-09-30 – 2019-10-06 (×12): 2 via RESPIRATORY_TRACT
  Filled 2019-09-30: qty 8.8

## 2019-09-30 NOTE — Progress Notes (Addendum)
Progress Note  Patient Name: Logan White Date of Encounter: 09/30/2019  Primary Cardiologist: Kirk Ruths, MD   Subjective   He reports breathing is improved from admission but no significant change from yesterday; no back to baseline. No complaints of chest pain or palpitations. He reports some LE edema, mostly at bedtime which improves on waking.   Inpatient Medications    Scheduled Meds: . albuterol  2.5 mg Nebulization BID  . apixaban  5 mg Oral BID  . aspirin EC  81 mg Oral Daily  . atorvastatin  80 mg Oral q1800  . Chlorhexidine Gluconate Cloth  6 each Topical Q0600  . hydrALAZINE  10 mg Oral BID  . isosorbide mononitrate  30 mg Oral Daily  . methylPREDNISolone (SOLU-MEDROL) injection  40 mg Intravenous BID  . metoprolol succinate  100 mg Oral Daily  . sodium chloride flush  3 mL Intravenous Q12H   Continuous Infusions: . sodium chloride     PRN Meds: sodium chloride, acetaminophen, albuterol, nitroGLYCERIN, sodium chloride flush   Vital Signs    Vitals:   09/29/19 2100 09/29/19 2200 09/30/19 0440 09/30/19 0733  BP: 120/90  108/75   Pulse: 80  87   Resp: 18  17   Temp: 98 F (36.7 C)  98.4 F (36.9 C)   TempSrc: Oral     SpO2: 99%  100% 99%  Weight:  80.3 kg    Height:        Intake/Output Summary (Last 24 hours) at 09/30/2019 9147 Last data filed at 09/29/2019 1944 Gross per 24 hour  Intake 1200 ml  Output 1400 ml  Net -200 ml   Filed Weights   09/28/19 1731 09/29/19 2200  Weight: 80.8 kg 80.3 kg    Telemetry    Atrial fibrillation with CVR - Personally Reviewed  ECG    No new tracings - Personally Reviewed  Physical Exam   GEN: sitting on the edge of his bed eating breakfast in no acute distress.   Neck: no carotid bruits Cardiac: IRIR, no murmurs, rubs, or gallops.  Respiratory: diffuse rhonchi and wheezing, no crackles appreciated GI: NABS, Soft, nontender, non-distended  MS: trace-1+ LE edema; No deformity. Neuro:   Nonfocal, moving all extremities spontaneously Psych: Normal affect   Labs    Chemistry Recent Labs  Lab 09/28/19 1829 09/28/19 1829 09/28/19 2342 09/29/19 0506 09/30/19 0455  NA 133*  --   --  135 133*  K 5.6*   < > 5.5* 4.6 3.9  CL 99  --   --  101 98  CO2 23  --   --  23 25  GLUCOSE 105*  --   --  119* 174*  BUN 49*  --   --  51* 51*  CREATININE 2.60*  --   --  2.50* 2.48*  CALCIUM 9.2  --   --  8.7* 8.6*  PROT 8.0  --   --   --   --   ALBUMIN 3.8  --   --   --   --   AST 32  --   --   --   --   ALT 25  --   --   --   --   ALKPHOS 186*  --   --   --   --   BILITOT 2.9*  --   --   --   --   GFRNONAA 23*  --   --  24* 24*  GFRAA  27*  --   --  28* 28*  ANIONGAP 11  --   --  11 10   < > = values in this interval not displayed.     Hematology Recent Labs  Lab 09/28/19 1829 09/29/19 0506  WBC 5.8 6.2  RBC 5.74 5.18  HGB 15.3 13.7  HCT 43.7 39.3  MCV 76.1* 75.9*  MCH 26.7 26.4  MCHC 35.0 34.9  RDW 17.0* 16.6*  PLT 185 147*    Cardiac EnzymesNo results for input(s): TROPONINI in the last 168 hours. No results for input(s): TROPIPOC in the last 168 hours.   BNP Recent Labs  Lab 09/28/19 1829  BNP 2,423.4*     DDimer No results for input(s): DDIMER in the last 168 hours.   Radiology    CT Head Wo Contrast  Result Date: 09/28/2019 CLINICAL DATA:  Pain status post fall. EXAM: CT HEAD WITHOUT CONTRAST CT CERVICAL SPINE WITHOUT CONTRAST TECHNIQUE: Multidetector CT imaging of the head and cervical spine was performed following the standard protocol without intravenous contrast. Multiplanar CT image reconstructions of the cervical spine were also generated. COMPARISON:  CT head dated 01/02/2013.  MRI dated Jan 04, 2013. FINDINGS: CT HEAD FINDINGS Brain: No evidence of acute infarction, hemorrhage, hydrocephalus, extra-axial collection or mass lesion/mass effect. Mild age related atrophy is noted. Vascular: No hyperdense vessel or unexpected calcification. Skull:  Normal. Negative for fracture or focal lesion. Sinuses/Orbits: No acute finding. Other: None. CT CERVICAL SPINE FINDINGS Alignment: Normal. Skull base and vertebrae: There is no acute displaced fracture. No dislocation. Is height loss of several cervical vertebral bodies, most evident at the C4-C5 levels, likely chronic. Soft tissues and spinal canal: No prevertebral fluid or swelling. No visible canal hematoma. Disc levels:  The disc heights are relatively well preserved. Upper chest: Negative. Other: None IMPRESSION: 1. No acute intracranial abnormality. 2. No acute cervical spine fracture. Electronically Signed   By: Constance Holster M.D.   On: 09/28/2019 19:19   CT Cervical Spine Wo Contrast  Result Date: 09/28/2019 CLINICAL DATA:  Pain status post fall. EXAM: CT HEAD WITHOUT CONTRAST CT CERVICAL SPINE WITHOUT CONTRAST TECHNIQUE: Multidetector CT imaging of the head and cervical spine was performed following the standard protocol without intravenous contrast. Multiplanar CT image reconstructions of the cervical spine were also generated. COMPARISON:  CT head dated 01/02/2013.  MRI dated Jan 04, 2013. FINDINGS: CT HEAD FINDINGS Brain: No evidence of acute infarction, hemorrhage, hydrocephalus, extra-axial collection or mass lesion/mass effect. Mild age related atrophy is noted. Vascular: No hyperdense vessel or unexpected calcification. Skull: Normal. Negative for fracture or focal lesion. Sinuses/Orbits: No acute finding. Other: None. CT CERVICAL SPINE FINDINGS Alignment: Normal. Skull base and vertebrae: There is no acute displaced fracture. No dislocation. Is height loss of several cervical vertebral bodies, most evident at the C4-C5 levels, likely chronic. Soft tissues and spinal canal: No prevertebral fluid or swelling. No visible canal hematoma. Disc levels:  The disc heights are relatively well preserved. Upper chest: Negative. Other: None IMPRESSION: 1. No acute intracranial abnormality. 2. No acute  cervical spine fracture. Electronically Signed   By: Constance Holster M.D.   On: 09/28/2019 19:19   DG Chest Port 1 View  Result Date: 09/28/2019 CLINICAL DATA:  Shortness of breath EXAM: PORTABLE CHEST 1 VIEW COMPARISON:  06/22/2019 FINDINGS: Heart size is significantly enlarged. The pulmonary arteries are dilated. Prominent interstitial lung markings. Aortic calcifications are noted. There are trace to small bilateral pleural effusions. There is no pneumothorax.  IMPRESSION: 1. Cardiomegaly with findings concerning for congestive heart failure. 2. Dilated pulmonary arteries which can be seen in patients with elevated pulmonary artery pressure. 3.  Choose Electronically Signed   By: Constance Holster M.D.   On: 09/28/2019 18:34   ECHOCARDIOGRAM COMPLETE  Result Date: 09/29/2019   ECHOCARDIOGRAM REPORT   Patient Name:   Logan White Date of Exam: 09/29/2019 Medical Rec #:  [id number]         Height:       75.5 in Accession #:    [id number]        Weight:       178.2 lb Date of Birth:  1941-06-04         BSA:          2.10 m Patient Age:    25 years          BP:           117/92 mmHg Patient Gender: M                 HR:           94 bpm. Exam Location:  Inpatient Procedure: 2D Echo Indications:    Cardiomyopathy-Unsepcified  History:        Patient has no prior history of Echocardiogram examinations and                 Patient has prior history of Echocardiogram examinations, most                 recent 08/08/2019. CHF, Arrythmias:Atrial Fibrillation,                 Signs/Symptoms:Shortness of Breath; Risk Factors:Former Smoker.                 Ischemic cardiomyopathy. CKD.  Sonographer:    Clayton Lefort RDCS (AE) Referring Phys: [id number] [place]  Sonographer Comments: Image acquisition challenging due to respiratory motion. IMPRESSIONS  1. Left ventricular ejection fraction, by visual estimation, is 25 to 30%. The left ventricle has severely decreased function. There is mildly increased left  ventricular hypertrophy.  2. Left ventricular diastolic function could not be evaluated.  3. Severely dilated left ventricular internal cavity size.  4. The left ventricle demonstrates global hypokinesis.  5. Global right ventricle has severely reduced systolic function.The right ventricular size is mildly enlarged.  6. Left atrial size was severely dilated.  7. The interatrial septum is aneurysmal.  8. Right atrial size was severely dilated.  9. The mitral valve is normal in structure. Moderate mitral valve regurgitation. No evidence of mitral stenosis. 10. The tricuspid valve is normal in structure. Tricuspid valve regurgitation is mild. 11. The aortic valve is tricuspid. Aortic valve regurgitation is mild. Mild to moderate aortic valve sclerosis/calcification without any evidence of aortic stenosis. 12. The pulmonic valve was normal in structure. Pulmonic valve regurgitation is mild. 13. Moderately elevated pulmonary artery systolic pressure. 14. The inferior vena cava is dilated in size with <50% respiratory variability, suggesting right atrial pressure of 15 mmHg. 15. Severe global reduction in LV systolic function; mild LVH; 4 chamber enlargement; severe RV dysfunction; mild AI; moderate MR; mild TR; moderate pulmonary hypertension. FINDINGS  Left Ventricle: Left ventricular ejection fraction, by visual estimation, is 25 to 30%. The left ventricle has severely decreased function. The left ventricle demonstrates global hypokinesis. The left ventricular internal cavity size was severely dilated left ventricle. There is mildly increased left ventricular hypertrophy. The left  ventricular diastology could not be evaluated due to atrial fibrillation. Left ventricular diastolic function could not be evaluated. Right Ventricle: The right ventricular size is mildly enlarged.Global RV systolic function is has severely reduced systolic function. The tricuspid regurgitant velocity is 2.66 m/s, and with an assumed right  atrial pressure of 15 mmHg, the estimated right ventricular systolic pressure is moderately elevated at 43.3 mmHg. Left Atrium: Left atrial size was severely dilated. Right Atrium: Right atrial size was severely dilated Pericardium: There is no evidence of pericardial effusion. Mitral Valve: The mitral valve is normal in structure. There is mild thickening of the mitral valve leaflet(s). Moderate mitral valve regurgitation. No evidence of mitral valve stenosis by observation. Tricuspid Valve: The tricuspid valve is normal in structure. Tricuspid valve regurgitation is mild. Aortic Valve: The aortic valve is tricuspid. Aortic valve regurgitation is mild. Aortic regurgitation PHT measures 643 msec. Mild to moderate aortic valve sclerosis/calcification is present, without any evidence of aortic stenosis. Pulmonic Valve: The pulmonic valve was normal in structure. Pulmonic valve regurgitation is mild. Pulmonic regurgitation is mild. Aorta: The aortic root is normal in size and structure. Venous: The inferior vena cava is dilated in size with less than 50% respiratory variability, suggesting right atrial pressure of 15 mmHg. IAS/Shunts: An The interatrial septum is aneurysmal is seen. Additional Comments: Severe global reduction in LV systolic function; mild LVH; 4 chamber enlargement; severe RV dysfunction; mild AI; moderate MR; mild TR; moderate pulmonary hypertension.  LEFT VENTRICLE PLAX 2D LVIDd:         6.50 cm LVIDs:         6.00 cm LV PW:         1.10 cm LV IVS:        1.20 cm LVOT diam:     2.00 cm  3D Volume EF: LV SV:         36 ml    3D EF:        34 % LV SV Index:   17.40    LV EDV:       233 ml LVOT Area:     3.14 cm LV ESV:       155 ml                         LV SV:        78 ml RIGHT VENTRICLE            IVC RV Basal diam:  4.10 cm    IVC diam: 2.60 cm RV S prime:     9.00 cm/s TAPSE (M-mode): 1.6 cm LEFT ATRIUM              Index        RIGHT ATRIUM           Index LA diam:        6.70 cm  3.19 cm/m    RA Area:     35.80 cm LA Vol (A2C):   261.0 ml 124.30 ml/m RA Volume:   128.00 ml 60.96 ml/m LA Vol (A4C):   272.0 ml 129.54 ml/m LA Biplane Vol: 267.0 ml 127.16 ml/m  AORTIC VALVE AI PHT:      643 msec  AORTA Ao Root diam: 2.90 cm Ao Asc diam:  3.20 cm MR Peak grad:    102.4 mmHg  TRICUSPID VALVE MR Mean grad:    58.0 mmHg   TR Peak grad:   28.3 mmHg MR Vmax:  506.00 cm/s TR Vmax:        328.00 cm/s MR Vmean:        350.0 cm/s MR PISA:         5.09 cm    SHUNTS MR PISA Eff ROA: 31 mm      Systemic Diam: 2.00 cm MR PISA Radius:  0.90 cm  Kirk Ruths MD Electronically signed by Kirk Ruths MD Signature Date/Time: 09/29/2019/10:17:51 AM    Final     Cardiac Studies   Echocardiogram 09/29/2019 1. Left ventricular ejection fraction, by visual estimation, is 25 to 30%. The left ventricle has severely decreased function. There is mildly increased left ventricular hypertrophy. 2. Left ventricular diastolic function could not be evaluated. 3. Severely dilated left ventricular internal cavity size. 4. The left ventricle demonstrates global hypokinesis. 5. Global right ventricle has severely reduced systolic function.The right ventricular size is mildly enlarged. 6. Left atrial size was severely dilated. 7. The interatrial septum is aneurysmal. 8. Right atrial size was severely dilated. 9. The mitral valve is normal in structure. Moderate mitral valve regurgitation. No evidence of mitral stenosis. 10. The tricuspid valve is normal in structure. Tricuspid valve regurgitation is mild. 11. The aortic valve is tricuspid. Aortic valve regurgitation is mild. Mild to moderate aortic valve sclerosis/calcification without any evidence of aortic stenosis. 12. The pulmonic valve was normal in structure. Pulmonic valve regurgitation is mild. 13. Moderately elevated pulmonary artery systolic pressure. 14. The inferior vena cava is dilated in size with <50% respiratory variability, suggesting  right atrial pressure of 15 mmHg. 15. Severe global reduction in LV systolic function; mild LVH; 4 chamber enlargement; severe RV dysfunction; mild AI; moderate MR; mild TR; moderate pulmonary hypertension.    Patient Profile     79 y.o. male with a hx of chronic systolic CHF 2/2 ischemic CM, coronary artery disease s/p prior myocardial infarction, prior VF arrest in the setting of hypokalemia, permanent atrial fibrillation, chronic kidney disease, peripheral arterial disease, mitral regurgitation, and COPD, who is being followed by cardiology for the evaluation of decompensated congestive heart failure and syncope   Assessment & Plan    1. Acute on chronic combined CHF: EF 25-30% this admission with severe LV dilation, mild LVH,, global hypokinesis, and severe RV failure. BNP elevated to 2400. He was initially given IV lasix, however on cardiology consult 09/29/19, he was felt to be euvolemmic and IV lasix held. It was felt underlying pulmonary disease may be playing a roll. UOP net -221mL in the past 24 hours and -467mL this admission. Cr. 2.6 on admission, up from baseline 1.5-1.8 over the past several months - CKD limits GDT.  -  Will trial one time dose of IV lasix 120mg  and monitor for response.  - If UOP limited, will consider RHC to determine volume status.  - Continue metoprolol succinate - Unable to add ACEi/ARB/ARNI due to CKD  2. Syncope: occurred while sitting on a stool in his bedroom with associated loss of bowels. Etiology unclear, though with history of VF arrest, he was recommended for outpatient cardiac monitoring for further evaluation. - Continue to monitor on telemetry while admitted - Will arrange outpatient cardiac monitor at discharge.   3. CAD: no ischemic changes on EKG and no anginal complaints -  Continue aspirin and atorvastatin - Continue metoprolol and imdur  4. Permanent atrial fibrillation: rates well controlled - Continue metoprolol for rate control -  Continue eliquis for stroke ppx.   5. Mitral regurgitation: moderate on echo which  is stable from previous - Continue routine outpatient monitoring  6. Hyperkalemia: K 5.6 on admission, improved with kayexalate. K 3.9 on labs this morning - Monitor electrolytes closely given VF arrest history - Goal K>4  7. COPD: likely contributing to SOB presentation. Still with diffuse wheezing/rhochi on exam. He was started on IV steroids and nebulizers. Likely driving respiratory failure.  - Continue management per primary team.  8. 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  - Continue to monitor closely with aggressive diuresis.   For questions or updates, please contact [place] Please consult [email or web address] for contact info under Cardiology/STEMI.      Signed, Abigail Butts, PA-C  09/30/2019, 8:23 AM   [phone]

## 2019-09-30 NOTE — Progress Notes (Signed)
[place] for IV heparin Indication: Afib while off Eliquis  No Known Allergies  Patient Measurements: Height: 6' 3.5" (191.8 cm) Weight: 177 lb 1.6 oz (80.3 kg) IBW/kg (Calculated) : 85.65 Heparin Dosing Weight: TBW  Vital Signs: Temp: 97.9 F (36.6 C) (01/25 1312) Temp Source: Oral (01/25 1312) BP: 105/62 (01/25 1312) Pulse Rate: 74 (01/25 1312)  Labs: Recent Labs    09/28/19 1829 09/29/19 0506 09/30/19 0455  HGB 15.3 13.7  --   HCT 43.7 39.3  --   PLT 185 147*  --   CREATININE 2.60* 2.50* 2.48*    Estimated Creatinine Clearance: 28.8 mL/min (A) (by C-G formula based on SCr of 2.48 mg/dL (H)).   Medical History: Past Medical History:  Diagnosis Date  . AAA (abdominal aortic aneurysm) ([place])    a. 08/2015 Abd U/S: 2.7 x 2.9 x 3.2 cm infrarenal AAA.  . Bradycardia    a. Requiring discontinuation of use of BB/CCB  . Cardiac arrest - ventricular fibrillation    a. 03/2012 in setting of hypokalemia (prolonged hosp with VDRF, tracheobronchitis, ARF, shock liver, PAF, AMS felt secondary to post-anoxic encephalopathy/shock).  . Carotid artery stenosis    a. 01/2011 - 40-59% bilateral stenosis;  b. 06/2015 Carotid U/S: 40-59% bilat ICA stenosis->f/u 6 mos.  . CKD (chronic kidney disease), stage III   . Coronary artery disease    a. s/p aborted ant STEMI tx with Cypher DES to LAD 10/04 (residual at cath: D1 50%, CFX 40% and multiple dist 70%, EF 55%);   b. myoview 3/10: Ef 47%, infero-apical isch, LOW RISK - med Tx recommended;  c. 12/2012 VF Arrest/Cath: LAD 40isr, 80 apical, LCX 100 (failed PTCA), RCA nondom.  . Diabetes mellitus   . Diverticulosis 2001  . Elevated LFTs    Shock liver 03/2012  . H/O: CVA (cardiovascular accident)   . Hematemesis    a. 12/2010 felt 2/2 Mallory Weiss tear - pt could not afford colonscopy/EGD at that time so Coumadin was deferred. Coumadin initiated 03/2012 without any evidence for bleeding.  Marland Kitchen History of  Ischemic Cardiomyopathy    a. EF 50-55% by echo 03/09/12 (was 30% by echo 02/24/12); b. 03/2013 Echo: EF 55-60%, mild MR, sev dil LA, mod dil RA, PASP 85mmHg.  Marland Kitchen Hypertensive heart disease    a. echo 4/12: EF 50%, asymmetric septal hypertrophy, no SAM or LVOT gradient, LAE, PASP 35  . Inguinal hernia   . 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 Mitral regurgitation    a. Mild by echo 03/2012 and 03/2013.  . Mixed Hyperlipidemia   . Peripheral vascular disease ([place])    a. h/o LE angioplasty;  b. 08/2015 Duplex: >50% RCIA, 100% REIA, >50% LEIA, elev vel in SMA, patent IVC.  Marland Kitchen Persistent atrial fibrillation ([place])    a. Dx 12/2010-->coumadin (CHA2DS2VASc = 7).  . QT prolongation     Medications:  Medications Prior to Admission  Medication Sig Dispense Refill Last Dose  . acetaminophen (TYLENOL) 325 MG tablet Take 650 mg by mouth every 6 (six) hours as needed for headache.   Past Month at Unknown time  . albuterol (VENTOLIN HFA) 108 (90 Base) MCG/ACT inhaler Inhale 2 puffs into the lungs every 6 (six) hours as needed for wheezing or shortness of breath. 90 g 3 09/28/2019 at Unknown time  . apixaban (ELIQUIS) 5 MG TABS tablet Take 1 tablet (5 mg total) by mouth 2 (two) times daily. 60 tablet 6 09/28/2019  at 0830  . atorvastatin (LIPITOR) 80 MG tablet Take 80 mg by mouth daily.   09/28/2019 at Unknown time  . Ensure (ENSURE) Take 237 mLs by mouth 2 (two) times daily as needed (nutritional supplement).   09/27/2019 at Unknown time  . hydrALAZINE (APRESOLINE) 10 MG tablet Take 1 tablet (10 mg total) by mouth 2 (two) times daily. 180 tablet 0 09/28/2019 at am  . metoprolol succinate (TOPROL-XL) 100 MG 24 hr tablet Take 1 tablet (100 mg total) by mouth daily. 30 tablet 0 09/28/2019 at 0830  . nitroGLYCERIN (NITROSTAT) 0.4 MG SL tablet Place 1 tablet (0.4 mg total) under the tongue every 5 (five) minutes as needed for chest pain (Up to 3 doses). 25 tablet 3 Unknown at unknown  .  potassium chloride SA (KLOR-CON) 20 MEQ tablet Take 20 mEq by mouth 2 (two) times daily.   09/28/2019 at am  . torsemide (DEMADEX) 20 MG tablet Take 2 tablets (40 mg total) by mouth 2 (two) times daily. 360 tablet 3 09/28/2019 at am  . isosorbide mononitrate (IMDUR) 30 MG 24 hr tablet Take 1 tablet (30 mg total) by mouth daily. (Patient not taking: Reported on 09/28/2019) 30 tablet 0 Not Taking at Unknown time   Scheduled:  . albuterol  2.5 mg Nebulization BID  . aspirin EC  81 mg Oral Daily  . atorvastatin  80 mg Oral q1800  . Chlorhexidine Gluconate Cloth  6 each Topical Q0600  . hydrALAZINE  10 mg Oral BID  . isosorbide mononitrate  30 mg Oral Daily  . methylPREDNISolone (SOLU-MEDROL) injection  40 mg Intravenous BID  . metoprolol succinate  100 mg Oral Daily  . mometasone-formoterol  2 puff Inhalation BID  . sodium chloride flush  3 mL Intravenous Q12H   Infusions:  . sodium chloride    . heparin      Assessment: 41 yoM with PMH CHF, CAD, permanent AFib on Eliquis, CKD, PAD, admitted for AECHF. May need RH cath this admission, so Pharmacy consulted to dose IV heparin for bridging   Baseline INR, aPTT: pending  Prior anticoagulation: Eliquis 5 mg daily; last dose 1/25 at 10am  Significant events:  Today, 09/30/2019:  CBC: stable WNL  Note acute on chronic AKI this admission  No bleeding or infusion issues per nursing  Goal of Therapy: APTT 66-102 sec Heparin level 0.3-0.7 units/ml Monitor platelets by anticoagulation protocol: Yes  Plan:  Heparin 1000 units/hr IV infusion  Check aPTT 8 hrs after start  Daily CBC and heparin level; aPTT as needed while DOAC effects on heparin level persist  Monitor for signs of bleeding or thrombosis  Reuel Boom, PharmD, BCPS [phone] 09/30/2019, 3:33 PM

## 2019-09-30 NOTE — Progress Notes (Signed)
PROGRESS NOTE    Logan White  GLO:[id number] DOB: 02-May-1941 DOA: 09/28/2019 PCP: Kelton Pillar, MD    Brief Narrative:  79 year old gentleman with extensive cardiac history including chronic systolic congestive heart failure with known ejection fraction of 30%, chronic A. fib on anticoagulation with Eliquis, previous V. fib arrest in 2014, ischemic cardiomyopathy, coronary artery disease, peripheral vascular disease and underlying COPD.  He is managed outpatient, recently increased dose of torsemide, despite that continued to have significant exertional dyspnea and orthopnea.  On the day of admission, he had an episode of severe shortness of breath on walking to the bathroom and near syncopal episode so EMS was called and he was brought to ER. In the emergency room 87% on room air.    Assessment & Plan:   Active Problems:   CHF (congestive heart failure), NYHA class IV, acute, systolic (HCC)  Acute hypoxic respiratory failure: Probably multifactorial.  More likely COPD exacerbation and less likely acute on chronic congestive heart failure, systolic: With significant symptoms. Echocardiogram with similar ejection fraction.   With wheezing and shortness of breath.   Given IV Lasix, further diuretics on hold for renal functions to stabilize.   We will aggressively treat as COPD exacerbation.  Patient on as needed inhaler at home.   IV steroids, start on inhalational steroids, scheduled and as needed bronchodilators, aggressive chest physiotherapy and mobility.  Followed by cardiology. Unable to be on ACE inhibitor due to poor renal functions.  Hyperkalemia: Patient was on potassium supplements.  Kayexalate given.  Normalized.  Potassium 3.9.  Will need close monitoring.  Acute on chronic kidney disease stage III: historical creatinine 1.3-1.4. Worsening serum creatinine probably due to acute congestive heart failure. Recheck levels to ensure stabilization. Will check renal  ultrasound to rule out any obstruction and look for kidney morphology.  Chronic A. fib: Rate controlled.  Patient on Eliquis and beta-blockers.  Hypertension: Blood pressures fairly stable.  Chronic bronchitis with acute exacerbation: As above.  DVT prophylaxis: Therapeutic on Eliquis Code Status: Full code Family Communication: Patient stated he is communicating with family. Disposition Plan: patient is from home.. Anticipated DC to home, Barriers to discharge active treatment , still on treatment with significant wheezing and shortness of breath and hypoxia.   Consultants:   Cardiology  Procedures:   None  Antimicrobials:   None   Subjective: No overnight events.  Still has significant wheezing.  Overall breathing better as per patient.  No fever.  Dry cough.  Objective: Vitals:   09/29/19 2100 09/29/19 2200 09/30/19 0440 09/30/19 0733  BP: 120/90  108/75   Pulse: 80  87   Resp: 18  17   Temp: 98 F (36.7 C)  98.4 F (36.9 C)   TempSrc: Oral     SpO2: 99%  100% 99%  Weight:  80.3 kg    Height:        Intake/Output Summary (Last 24 hours) at 09/30/2019 1049 Last data filed at 09/29/2019 1944 Gross per 24 hour  Intake 1200 ml  Output 1400 ml  Net -200 ml   Filed Weights   09/28/19 1731 09/29/19 2200  Weight: 80.8 kg 80.3 kg    Examination:  General exam: Appears calm and comfortable, on 1 to 2 L of oxygen. Respiratory system: Bilateral extensive expiratory wheezes and conducted airway sounds. Cardiovascular system: S1 & S2 heard, irregularly irregular.  Shiny bilateral legs.  Patient has peripheral vascular disease.  No edema. Gastrointestinal system: Abdomen is nondistended, soft and nontender. Marland Kitchen  Central nervous system: Alert and oriented. No focal neurological deficits. Extremities: Symmetric 5 x 5 power. Skin: No rashes, lesions or ulcers Psychiatry: Judgement and insight appear normal. Mood & affect appropriate.     Data Reviewed: I have  personally reviewed following labs and imaging studies  CBC: Recent Labs  Lab 09/28/19 1829 09/29/19 0506  WBC 5.8 6.2  NEUTROABS 2.8  --   HGB 15.3 13.7  HCT 43.7 39.3  MCV 76.1* 75.9*  PLT 185 782*   Basic Metabolic Panel: Recent Labs  Lab 09/28/19 1829 09/28/19 2342 09/29/19 0506 09/30/19 0455  NA 133*  --  135 133*  K 5.6* 5.5* 4.6 3.9  CL 99  --  101 98  CO2 23  --  23 25  GLUCOSE 105*  --  119* 174*  BUN 49*  --  51* 51*  CREATININE 2.60*  --  2.50* 2.48*  CALCIUM 9.2  --  8.7* 8.6*  MG  --   --   --  2.3  PHOS  --   --   --  5.4*   GFR: Estimated Creatinine Clearance: 28.8 mL/min (A) (by C-G formula based on SCr of 2.48 mg/dL (H)). Liver Function Tests: Recent Labs  Lab 09/28/19 1829  AST 32  ALT 25  ALKPHOS 186*  BILITOT 2.9*  PROT 8.0  ALBUMIN 3.8   No results for input(s): LIPASE, AMYLASE in the last 168 hours. No results for input(s): AMMONIA in the last 168 hours. Coagulation Profile: No results for input(s): INR, PROTIME in the last 168 hours. Cardiac Enzymes: No results for input(s): CKTOTAL, CKMB, CKMBINDEX, TROPONINI in the last 168 hours. BNP (last 3 results) Recent Labs    08/15/19 0949  PROBNP 16,511*   HbA1C: No results for input(s): HGBA1C in the last 72 hours. CBG: No results for input(s): GLUCAP in the last 168 hours. Lipid Profile: No results for input(s): CHOL, HDL, LDLCALC, TRIG, CHOLHDL, LDLDIRECT in the last 72 hours. Thyroid Function Tests: No results for input(s): TSH, T4TOTAL, FREET4, T3FREE, THYROIDAB in the last 72 hours. Anemia Panel: No results for input(s): VITAMINB12, FOLATE, FERRITIN, TIBC, IRON, RETICCTPCT in the last 72 hours. Sepsis Labs: No results for input(s): PROCALCITON, LATICACIDVEN in the last 168 hours.  Recent Results (from the past 240 hour(s))  Respiratory Panel by RT PCR (Flu A&B, Covid) - Nasopharyngeal Swab     Status: None   Collection Time: 09/28/19  6:29 PM   Specimen: Nasopharyngeal  Swab  Result Value Ref Range Status   SARS Coronavirus 2 by RT PCR NEGATIVE NEGATIVE Final    Comment: (NOTE) SARS-CoV-2 target nucleic acids are NOT DETECTED. The SARS-CoV-2 RNA is generally detectable in upper respiratoy specimens during the acute phase of infection. The lowest concentration of SARS-CoV-2 viral copies this assay can detect is 131 copies/mL. A negative result does not preclude SARS-Cov-2 infection and should not be used as the sole basis for treatment or other patient management decisions. A negative result may occur with  improper specimen collection/handling, submission of specimen other than nasopharyngeal swab, presence of viral mutation(s) within the areas targeted by this assay, and inadequate number of viral copies (<131 copies/mL). A negative result must be combined with clinical observations, patient history, and epidemiological information. The expected result is Negative. Fact Sheet for Patients:  PinkCheek.be Fact Sheet for Healthcare Providers:  GravelBags.it This test is not yet ap proved or cleared by the [place] FDA and  has been authorized for detection and/or diagnosis  of SARS-CoV-2 by FDA under an Emergency Use Authorization (EUA). This EUA will remain  in effect (meaning this test can be used) for the duration of the COVID-19 declaration under Section 564(b)(1) of the Act, 21 U.S.C. section 360bbb-3(b)(1), unless the authorization is terminated or revoked sooner.    Influenza A by PCR NEGATIVE NEGATIVE Final   Influenza B by PCR NEGATIVE NEGATIVE Final    Comment: (NOTE) The Xpert Xpress SARS-CoV-2/FLU/RSV assay is intended as an aid in  the diagnosis of influenza from Nasopharyngeal swab specimens and  should not be used as a sole basis for treatment. Nasal washings and  aspirates are unacceptable for Xpert Xpress SARS-CoV-2/FLU/RSV  testing. Fact Sheet for  Patients: PinkCheek.be Fact Sheet for Healthcare Providers: GravelBags.it This test is not yet approved or cleared by the [place] FDA and  has been authorized for detection and/or diagnosis of SARS-CoV-2 by  FDA under an Emergency Use Authorization (EUA). This EUA will remain  in effect (meaning this test can be used) for the duration of the  Covid-19 declaration under Section 564(b)(1) of the Act, 21  U.S.C. section 360bbb-3(b)(1), unless the authorization is  terminated or revoked. Performed at [facility], [place] [address] [place], [place] [address]          Radiology Studies: CT Head Wo Contrast  Result Date: 09/28/2019 CLINICAL DATA:  Pain status post fall. EXAM: CT HEAD WITHOUT CONTRAST CT CERVICAL SPINE WITHOUT CONTRAST TECHNIQUE: Multidetector CT imaging of the head and cervical spine was performed following the standard protocol without intravenous contrast. Multiplanar CT image reconstructions of the cervical spine were also generated. COMPARISON:  CT head dated 01/02/2013.  MRI dated Jan 04, 2013. FINDINGS: CT HEAD FINDINGS Brain: No evidence of acute infarction, hemorrhage, hydrocephalus, extra-axial collection or mass lesion/mass effect. Mild age related atrophy is noted. Vascular: No hyperdense vessel or unexpected calcification. Skull: Normal. Negative for fracture or focal lesion. Sinuses/Orbits: No acute finding. Other: None. CT CERVICAL SPINE FINDINGS Alignment: Normal. Skull base and vertebrae: There is no acute displaced fracture. No dislocation. Is height loss of several cervical vertebral bodies, most evident at the C4-C5 levels, likely chronic. Soft tissues and spinal canal: No prevertebral fluid or swelling. No visible canal hematoma. Disc levels:  The disc heights are relatively well preserved. Upper chest: Negative. Other: None IMPRESSION: 1. No acute intracranial abnormality. 2. No acute  cervical spine fracture. Electronically Signed   By: Constance Holster M.D.   On: 09/28/2019 19:19   CT Cervical Spine Wo Contrast  Result Date: 09/28/2019 CLINICAL DATA:  Pain status post fall. EXAM: CT HEAD WITHOUT CONTRAST CT CERVICAL SPINE WITHOUT CONTRAST TECHNIQUE: Multidetector CT imaging of the head and cervical spine was performed following the standard protocol without intravenous contrast. Multiplanar CT image reconstructions of the cervical spine were also generated. COMPARISON:  CT head dated 01/02/2013.  MRI dated Jan 04, 2013. FINDINGS: CT HEAD FINDINGS Brain: No evidence of acute infarction, hemorrhage, hydrocephalus, extra-axial collection or mass lesion/mass effect. Mild age related atrophy is noted. Vascular: No hyperdense vessel or unexpected calcification. Skull: Normal. Negative for fracture or focal lesion. Sinuses/Orbits: No acute finding. Other: None. CT CERVICAL SPINE FINDINGS Alignment: Normal. Skull base and vertebrae: There is no acute displaced fracture. No dislocation. Is height loss of several cervical vertebral bodies, most evident at the C4-C5 levels, likely chronic. Soft tissues and spinal canal: No prevertebral fluid or swelling. No visible canal hematoma. Disc levels:  The disc heights are relatively well  preserved. Upper chest: Negative. Other: None IMPRESSION: 1. No acute intracranial abnormality. 2. No acute cervical spine fracture. Electronically Signed   By: Constance Holster M.D.   On: 09/28/2019 19:19   DG Chest Port 1 View  Result Date: 09/28/2019 CLINICAL DATA:  Shortness of breath EXAM: PORTABLE CHEST 1 VIEW COMPARISON:  06/22/2019 FINDINGS: Heart size is significantly enlarged. The pulmonary arteries are dilated. Prominent interstitial lung markings. Aortic calcifications are noted. There are trace to small bilateral pleural effusions. There is no pneumothorax. IMPRESSION: 1. Cardiomegaly with findings concerning for congestive heart failure. 2. Dilated  pulmonary arteries which can be seen in patients with elevated pulmonary artery pressure. 3.  Choose Electronically Signed   By: Constance Holster M.D.   On: 09/28/2019 18:34   ECHOCARDIOGRAM COMPLETE  Result Date: 09/29/2019   ECHOCARDIOGRAM REPORT   Patient Name:   Logan White Date of Exam: 09/29/2019 Medical Rec #:  [id number]         Height:       75.5 in Accession #:    [id number]        Weight:       178.2 lb Date of Birth:  11/09/1940         BSA:          2.10 m Patient Age:    11 years          BP:           117/92 mmHg Patient Gender: M                 HR:           94 bpm. Exam Location:  Inpatient Procedure: 2D Echo Indications:    Cardiomyopathy-Unsepcified  History:        Patient has no prior history of Echocardiogram examinations and                 Patient has prior history of Echocardiogram examinations, most                 recent 08/08/2019. CHF, Arrythmias:Atrial Fibrillation,                 Signs/Symptoms:Shortness of Breath; Risk Factors:Former Smoker.                 Ischemic cardiomyopathy. CKD.  Sonographer:    Clayton Lefort RDCS (AE) Referring Phys: [id number] [place]  Sonographer Comments: Image acquisition challenging due to respiratory motion. IMPRESSIONS  1. Left ventricular ejection fraction, by visual estimation, is 25 to 30%. The left ventricle has severely decreased function. There is mildly increased left ventricular hypertrophy.  2. Left ventricular diastolic function could not be evaluated.  3. Severely dilated left ventricular internal cavity size.  4. The left ventricle demonstrates global hypokinesis.  5. Global right ventricle has severely reduced systolic function.The right ventricular size is mildly enlarged.  6. Left atrial size was severely dilated.  7. The interatrial septum is aneurysmal.  8. Right atrial size was severely dilated.  9. The mitral valve is normal in structure. Moderate mitral valve regurgitation. No evidence of mitral stenosis. 10. The  tricuspid valve is normal in structure. Tricuspid valve regurgitation is mild. 11. The aortic valve is tricuspid. Aortic valve regurgitation is mild. Mild to moderate aortic valve sclerosis/calcification without any evidence of aortic stenosis. 12. The pulmonic valve was normal in structure. Pulmonic valve regurgitation is mild. 13. Moderately elevated pulmonary artery systolic pressure. 14. The inferior vena  cava is dilated in size with <50% respiratory variability, suggesting right atrial pressure of 15 mmHg. 15. Severe global reduction in LV systolic function; mild LVH; 4 chamber enlargement; severe RV dysfunction; mild AI; moderate MR; mild TR; moderate pulmonary hypertension. FINDINGS  Left Ventricle: Left ventricular ejection fraction, by visual estimation, is 25 to 30%. The left ventricle has severely decreased function. The left ventricle demonstrates global hypokinesis. The left ventricular internal cavity size was severely dilated left ventricle. There is mildly increased left ventricular hypertrophy. The left ventricular diastology could not be evaluated due to atrial fibrillation. Left ventricular diastolic function could not be evaluated. Right Ventricle: The right ventricular size is mildly enlarged.Global RV systolic function is has severely reduced systolic function. The tricuspid regurgitant velocity is 2.66 m/s, and with an assumed right atrial pressure of 15 mmHg, the estimated right ventricular systolic pressure is moderately elevated at 43.3 mmHg. Left Atrium: Left atrial size was severely dilated. Right Atrium: Right atrial size was severely dilated Pericardium: There is no evidence of pericardial effusion. Mitral Valve: The mitral valve is normal in structure. There is mild thickening of the mitral valve leaflet(s). Moderate mitral valve regurgitation. No evidence of mitral valve stenosis by observation. Tricuspid Valve: The tricuspid valve is normal in structure. Tricuspid valve regurgitation  is mild. Aortic Valve: The aortic valve is tricuspid. Aortic valve regurgitation is mild. Aortic regurgitation PHT measures 643 msec. Mild to moderate aortic valve sclerosis/calcification is present, without any evidence of aortic stenosis. Pulmonic Valve: The pulmonic valve was normal in structure. Pulmonic valve regurgitation is mild. Pulmonic regurgitation is mild. Aorta: The aortic root is normal in size and structure. Venous: The inferior vena cava is dilated in size with less than 50% respiratory variability, suggesting right atrial pressure of 15 mmHg. IAS/Shunts: An The interatrial septum is aneurysmal is seen. Additional Comments: Severe global reduction in LV systolic function; mild LVH; 4 chamber enlargement; severe RV dysfunction; mild AI; moderate MR; mild TR; moderate pulmonary hypertension.  LEFT VENTRICLE PLAX 2D LVIDd:         6.50 cm LVIDs:         6.00 cm LV PW:         1.10 cm LV IVS:        1.20 cm LVOT diam:     2.00 cm  3D Volume EF: LV SV:         36 ml    3D EF:        34 % LV SV Index:   17.40    LV EDV:       233 ml LVOT Area:     3.14 cm LV ESV:       155 ml                         LV SV:        78 ml RIGHT VENTRICLE            IVC RV Basal diam:  4.10 cm    IVC diam: 2.60 cm RV S prime:     9.00 cm/s TAPSE (M-mode): 1.6 cm LEFT ATRIUM              Index        RIGHT ATRIUM           Index LA diam:        6.70 cm  3.19 cm/m   RA Area:     35.80 cm  LA Vol (A2C):   261.0 ml 124.30 ml/m RA Volume:   128.00 ml 60.96 ml/m LA Vol (A4C):   272.0 ml 129.54 ml/m LA Biplane Vol: 267.0 ml 127.16 ml/m  AORTIC VALVE AI PHT:      643 msec  AORTA Ao Root diam: 2.90 cm Ao Asc diam:  3.20 cm MR Peak grad:    102.4 mmHg  TRICUSPID VALVE MR Mean grad:    58.0 mmHg   TR Peak grad:   28.3 mmHg MR Vmax:         506.00 cm/s TR Vmax:        328.00 cm/s MR Vmean:        350.0 cm/s MR PISA:         5.09 cm    SHUNTS MR PISA Eff ROA: 31 mm      Systemic Diam: 2.00 cm MR PISA Radius:  0.90 cm  Kirk Ruths MD Electronically signed by Kirk Ruths MD Signature Date/Time: 09/29/2019/10:17:51 AM    Final         Scheduled Meds: . albuterol  2.5 mg Nebulization BID  . apixaban  5 mg Oral BID  . aspirin EC  81 mg Oral Daily  . atorvastatin  80 mg Oral q1800  . Chlorhexidine Gluconate Cloth  6 each Topical Q0600  . hydrALAZINE  10 mg Oral BID  . isosorbide mononitrate  30 mg Oral Daily  . methylPREDNISolone (SOLU-MEDROL) injection  40 mg Intravenous BID  . metoprolol succinate  100 mg Oral Daily  . mometasone-formoterol  2 puff Inhalation BID  . sodium chloride flush  3 mL Intravenous Q12H   Continuous Infusions: . sodium chloride       LOS: 2 days    Time spent: 30 minutes    Barb Merino, MD [facility] Pager [phone]

## 2019-09-30 NOTE — TOC Initial Note (Signed)
Transition of Care [place] [facility]) - Initial/Assessment Note    Patient Details  Name: Logan White MRN: [id number] Date of Birth: 06-14-1941  Transition of Care [facility]) CM/SW Contact:    Dessa Phi, RN Phone Number: 09/30/2019, 12:20 PM  Clinical Narrative:  Patient/spouse d/c plan is home. PT cons-await recc.                 Expected Discharge Plan: [place] Barriers to Discharge: Continued Medical Work up   Patient Goals and CMS Choice Patient states their goals for this hospitalization and ongoing recovery are:: go home      Expected Discharge Plan and Services Expected Discharge Plan: [place]   Discharge Planning Services: CM Consult   Living arrangements for the past 2 months: Single Family Home                                      Prior Living Arrangements/Services Living arrangements for the past 2 months: Single Family Home Lives with:: Spouse Patient language and need for interpreter reviewed:: Yes Do you feel safe going back to the place where you live?: Yes      Need for Family Participation in Patient Care: No (Comment) Care giver support system in place?: Yes (comment) Current home services: DME(rw) Criminal Activity/Legal Involvement Pertinent to Current Situation/Hospitalization: No - Comment as needed  Activities of Daily Living Home Assistive Devices/Equipment: Cane (specify quad or straight)(Straight cane belonging to the patient in the room) ADL Screening (condition at time of admission) Patient's cognitive ability adequate to safely complete daily activities?: Yes Is the patient deaf or have difficulty hearing?: No Does the patient have difficulty seeing, even when wearing glasses/contacts?: No Does the patient have difficulty concentrating, remembering, or making decisions?: No Patient able to express need for assistance with ADLs?: Yes Does the patient have difficulty dressing or bathing?:  Yes Independently performs ADLs?: No Communication: Independent Dressing (OT): Needs assistance Is this a change from baseline?: Change from baseline, expected to last <3days Grooming: Needs assistance Is this a change from baseline?: Change from baseline, expected to last <3 days Feeding: Independent Bathing: Needs assistance Is this a change from baseline?: Change from baseline, expected to last >3 days Toileting: Needs assistance Is this a change from baseline?: Change from baseline, expected to last <3 days In/Out Bed: Needs assistance Is this a change from baseline?: Change from baseline, expected to last <3 days Walks in Home: Needs assistance Is this a change from baseline?: Change from baseline, expected to last <3 days Does the patient have difficulty walking or climbing stairs?: No Weakness of Legs: Both Weakness of Arms/Hands: Both  Permission Sought/Granted Permission sought to share information with : Case Manager Permission granted to share information with : Yes, Verbal Permission Granted  Share Information with NAME: Case Manager  Permission granted to share info w AGENCY: [place] granted to share info w Relationship: [name] spouse [phone]     Emotional Assessment Appearance:: Appears stated age Attitude/Demeanor/Rapport: Gracious Affect (typically observed): Accepting Orientation: : Oriented to Self, Oriented to Place, Oriented to  Time, Oriented to Situation Alcohol / Substance Use: Not Applicable Psych Involvement: No (comment)  Admission diagnosis:  Shortness of breath [R06.02] SOB (shortness of breath) [R06.02] AKI (acute kidney injury) ([place]) [N17.9] CHF (congestive heart failure), NYHA class IV, acute, systolic (HCC) [J18.84] Acute on chronic systolic (  congestive) heart failure (HCC) [I50.23] Acute on chronic congestive heart failure, unspecified heart failure type [facility]) [I50.9] Patient Active Problem List   Diagnosis Date Noted  . CHF  (congestive heart failure), NYHA class IV, acute, systolic ([place]) 88/89/1694  . Persistent atrial fibrillation ([place])   . Acute on chronic systolic CHF (congestive heart failure) ([place]) 06/22/2019  . Acute on chronic systolic (congestive) heart failure ([place]) 06/22/2019  . CAD S/P PCI 10/13/2015  . Cardiomyopathy, new. Etiology apeears to be NICM 10/13/2015  . AAA (abdominal aortic aneurysm) ([place])   . Peripheral vascular disease ([place])   . Carotid artery stenosis   . Hypertensive heart disease   . Bruit 10/24/2014  . Acute on chronic renal failure ([place]) 01/11/2013  . Acute MI, anterolateral wall, initial episode of care ([place]) 12/31/2012  . Cardiac arrest due to underlying cardiac condition ([place]) 12/31/2012  . VF (ventricular fibrillation) ([place]) 12/31/2012  . Abnormal LFTs 12/31/2012  . Acute on chronic renal insufficiency 12/31/2012  . Chronic anticoagulation 12/31/2012  . Hypokalemia 12/31/2012  . Fatigue 04/23/2012  . Long term (current) use of anticoagulants 03/19/2012  . CKD (chronic kidney disease), stage III 03/16/2012  . Acute confusion/delerium 03/12/2012  . Delirium 03/12/2012  . Acute systolic CHF (congestive heart failure) ([place]) 03/11/2012  . Septic shock(785.52) 02/27/2012    Class: Acute  . Cardiogenic shock ([place]) 02/25/2012  . Acute renal failure ([place]) 02/25/2012  . Anoxic encephalopathy ([place]) 02/25/2012  . Ischemic cardiomyopathy 02/25/2012  . Cardiac arrest ([place]) 02/23/2012  . Acute respiratory failure with hypoxia ([place]) 02/23/2012  . Ventricular fibrillation ([place]) 02/23/2012  . DM (diabetes mellitus) ([place]) 02/23/2012  . Screening for colon cancer 02/10/2011  . Hematemesis 01/13/2011  . Dyspnea 01/13/2011  . RENAL INSUFFICIENCY 08/26/2010  . HYPERPOTASSEMIA 09/15/2009  . TOBACCO ABUSE 08/11/2009  . GLUCOSE INTOLERANCE 10/02/2008  . Hyperlipidemia 10/02/2008  . Essential hypertension 10/02/2008  . Cerebrovascular disease 10/02/2008  . PERIPHERAL VASCULAR DISEASE  10/02/2008  . INGUINAL HERNIA 10/02/2008   PCP:  Kelton Pillar, MD Pharmacy:   [facility] [place] [address] Phone: [phone] Fax: [phone]  [facility] [phone] - [place], [place] [address] Phone: [phone] Fax: [phone]  [facility] [place] [address] Phone: [phone] Fax: [phone]     Social Determinants of Health (SDOH) Interventions    Readmission Risk Interventions Readmission Risk Prevention Plan 09/30/2019  Transportation Screening Complete  PCP or Specialist Appt within 3-5 Days Complete  HRI or [place] Complete  Social Work Consult for [place] Planning/Counseling Complete  Palliative Care Screening Not Applicable  Medication Review Press photographer) Complete  Some recent data might be hidden

## 2019-10-01 ENCOUNTER — Institutional Professional Consult (permissible substitution): Payer: Medicare HMO | Admitting: Pulmonary Disease

## 2019-10-01 LAB — BASIC METABOLIC PANEL
Anion gap: 11 (ref 5–15)
BUN: 65 mg/dL — ABNORMAL HIGH (ref 8–23)
CO2: 24 mmol/L (ref 22–32)
Calcium: 8.8 mg/dL — ABNORMAL LOW (ref 8.9–10.3)
Chloride: 97 mmol/L — ABNORMAL LOW (ref 98–111)
Creatinine, Ser: 2.3 mg/dL — ABNORMAL HIGH (ref 0.61–1.24)
GFR calc Af Amer: 31 mL/min — ABNORMAL LOW (ref >60)
GFR calc non Af Amer: 27 mL/min — ABNORMAL LOW (ref >60)
Glucose, Bld: 148 mg/dL — ABNORMAL HIGH (ref 70–99)
Potassium: 4.3 mmol/L (ref 3.5–5.1)
Sodium: 132 mmol/L — ABNORMAL LOW (ref 135–145)

## 2019-10-01 LAB — CBC
HCT: 34.8 % — ABNORMAL LOW (ref 39.0–52.0)
Hemoglobin: 12.2 g/dL — ABNORMAL LOW (ref 13.0–17.0)
MCH: 26.2 pg (ref 26.0–34.0)
MCHC: 35.1 g/dL (ref 30.0–36.0)
MCV: 74.8 fL — ABNORMAL LOW (ref 80.0–100.0)
Platelets: 117 10*3/uL — ABNORMAL LOW (ref 150–400)
RBC: 4.65 MIL/uL (ref 4.22–5.81)
RDW: 16.2 % — ABNORMAL HIGH (ref 11.5–15.5)
WBC: 8.4 10*3/uL (ref 4.0–10.5)
nRBC: 0 % (ref 0.0–0.2)

## 2019-10-01 LAB — APTT
aPTT: 53 seconds — ABNORMAL HIGH (ref 24–36)
aPTT: 94 seconds — ABNORMAL HIGH (ref 24–36)

## 2019-10-01 LAB — HEPARIN LEVEL (UNFRACTIONATED): Heparin Unfractionated: >2.2 IU/mL — ABNORMAL HIGH (ref 0.30–0.70)

## 2019-10-01 MED ORDER — SODIUM CHLORIDE 0.9 % IV SOLN: 250.0000 mL | INTRAVENOUS | Status: DC | PRN

## 2019-10-01 MED ORDER — SODIUM CHLORIDE 0.9% FLUSH
3.0000 mL | Freq: Two times a day (BID) | INTRAVENOUS | Status: DC
  Administered 2019-10-01 – 2019-10-03 (×3): 3 mL via INTRAVENOUS

## 2019-10-01 MED ORDER — SODIUM CHLORIDE 0.9 % IV SOLN: INTRAVENOUS | Status: DC

## 2019-10-01 MED ORDER — ISOSORBIDE DINITRATE 20 MG PO TABS
20.0000 mg | ORAL_TABLET | Freq: Three times a day (TID) | ORAL | Status: DC
  Administered 2019-10-01 – 2019-10-06 (×13): 20 mg via ORAL
  Filled 2019-10-01 (×18): qty 1

## 2019-10-01 MED ORDER — OXYMETAZOLINE HCL 0.05 % NA SOLN
1.0000 | Freq: Two times a day (BID) | NASAL | Status: DC
  Administered 2019-10-01 – 2019-10-06 (×10): 1 via NASAL
  Filled 2019-10-01: qty 15

## 2019-10-01 MED ORDER — METOLAZONE 5 MG PO TABS
5.0000 mg | ORAL_TABLET | Freq: Once | ORAL | Status: AC
  Administered 2019-10-01: 5 mg via ORAL
  Filled 2019-10-01: qty 1

## 2019-10-01 MED ORDER — FUROSEMIDE 10 MG/ML IJ SOLN
160.0000 mg | Freq: Once | INTRAVENOUS | Status: AC
  Administered 2019-10-01: 160 mg via INTRAVENOUS
  Filled 2019-10-01: qty 10

## 2019-10-01 MED ORDER — SODIUM CHLORIDE 0.9% FLUSH: 3.0000 mL | INTRAVENOUS | Status: DC | PRN

## 2019-10-01 MED ORDER — HYDRALAZINE HCL 10 MG PO TABS
10.0000 mg | ORAL_TABLET | Freq: Three times a day (TID) | ORAL | Status: DC
  Administered 2019-10-01 – 2019-10-06 (×15): 10 mg via ORAL
  Filled 2019-10-01 (×16): qty 1

## 2019-10-01 NOTE — Progress Notes (Signed)
On call paged about patient having one episode of a nose bleed, added humidifier to nasal cannula air, no more nose bleeding noted. No new orders, will continue to monitor.  Norlene Duel RN, BSN

## 2019-10-01 NOTE — Progress Notes (Signed)
BP 97/72. P 88. Attending physician notified. Hydralazine and Isosorbide Mononitrate not given per MD instruction.

## 2019-10-01 NOTE — Care Management Important Message (Signed)
Important Message  Patient Details IM Letter given to Dessa Phi RN Case Manager to present to the patient Name: ANGAD NABERS MRN: [id number] Date of Birth: Apr 20, 1941   Medicare Important Message Given:  Yes     Kerin Salen 10/01/2019, 11:29 AM

## 2019-10-01 NOTE — Progress Notes (Signed)
Progress Note  Patient Name: Logan White Date of Encounter: 10/01/2019  Primary Cardiologist: Kirk Ruths, MD   Subjective   Reports breathing has improved however has expiratory wheezing while eating breakfast this a.m.  Poor output despite high-dose IV Lasix.  Denies chest pain.  Inpatient Medications    Scheduled Meds: . albuterol  2.5 mg Nebulization BID  . aspirin EC  81 mg Oral Daily  . atorvastatin  80 mg Oral q1800  . Chlorhexidine Gluconate Cloth  6 each Topical Q0600  . hydrALAZINE  10 mg Oral BID  . isosorbide mononitrate  30 mg Oral Daily  . methylPREDNISolone (SOLU-MEDROL) injection  40 mg Intravenous BID  . metoprolol succinate  100 mg Oral Daily  . mometasone-formoterol  2 puff Inhalation BID  . sodium chloride flush  3 mL Intravenous Q12H   Continuous Infusions: . sodium chloride    . heparin 1,000 Units/hr (09/30/19 2230)   PRN Meds: sodium chloride, acetaminophen, albuterol, nitroGLYCERIN, sodium chloride flush   Vital Signs    Vitals:   10/01/19 0402 10/01/19 0414 10/01/19 0421 10/01/19 0508  BP:    (!) 105/51  Pulse:    91  Resp:    20  Temp:    97.9 F (36.6 C)  TempSrc:    Oral  SpO2:  100% 100%   Weight: 82.6 kg     Height:        Intake/Output Summary (Last 24 hours) at 10/01/2019 0737 Last data filed at 10/01/2019 0600 Gross per 24 hour  Intake 576.34 ml  Output 100 ml  Net 476.34 ml   Filed Weights   09/28/19 1731 09/29/19 2200 10/01/19 0402  Weight: 80.8 kg 80.3 kg 82.6 kg    Physical Exam   General: Well developed, well nourished, NA Neck: Negative for carotid bruits. + JVD Lungs: Diminished in upper lobes with rhonchi in lower lobes.  Breathing is labored Cardiovascular: Irregularly irregular with S1 S2. No murmurs Abdomen: Soft, non-tender, non-distended. No obvious abdominal masses. Extremities: Mild BLE edema. DP pulses 2+ bilaterally Neuro: Alert and oriented. No focal deficits. No facial asymmetry. MAE  spontaneously. Psych: Responds to questions appropriately with normal affect.    Labs    Chemistry Recent Labs  Lab 09/28/19 1829 09/28/19 2342 09/29/19 0506 09/30/19 0455 10/01/19 0557  NA 133*  --  135 133* 132*  K 5.6*   < > 4.6 3.9 4.3  CL 99  --  101 98 97*  CO2 23  --  23 25 24   GLUCOSE 105*  --  119* 174* 148*  BUN 49*  --  51* 51* 65*  CREATININE 2.60*  --  2.50* 2.48* 2.30*  CALCIUM 9.2  --  8.7* 8.6* 8.8*  PROT 8.0  --   --   --   --   ALBUMIN 3.8  --   --   --   --   AST 32  --   --   --   --   ALT 25  --   --   --   --   ALKPHOS 186*  --   --   --   --   BILITOT 2.9*  --   --   --   --   GFRNONAA 23*  --  24* 24* 27*  GFRAA 27*  --  28* 28* 31*  ANIONGAP 11  --  11 10 11    < > = values in this interval not displayed.  Hematology Recent Labs  Lab 09/28/19 1829 09/29/19 0506 10/01/19 0557  WBC 5.8 6.2 8.4  RBC 5.74 5.18 4.65  HGB 15.3 13.7 12.2*  HCT 43.7 39.3 34.8*  MCV 76.1* 75.9* 74.8*  MCH 26.7 26.4 26.2  MCHC 35.0 34.9 35.1  RDW 17.0* 16.6* 16.2*  PLT 185 147* 117*    Cardiac EnzymesNo results for input(s): TROPONINI in the last 168 hours. No results for input(s): TROPIPOC in the last 168 hours.   BNP Recent Labs  Lab 09/28/19 1829  BNP 2,423.4*     DDimer No results for input(s): DDIMER in the last 168 hours.   Radiology    US RENAL  Result Date: 09/30/2019 CLINICAL DATA:  Acute on chronic renal failure EXAM: RENAL / URINARY TRACT ULTRASOUND COMPLETE COMPARISON:  11/09/2017 FINDINGS: Right Kidney: Renal measurements: 9.6 x 3.9 x 4.6 cm = volume: 90.6 mL. Cortex is echogenic. No hydronephrosis or mass Left Kidney: Renal measurements: 11.5 x 5.3 x 5.6 cm = volume: 177.5 mL. Cortex is echogenic. No hydronephrosis or mass Bladder: Appears normal for degree of bladder distention. Other: Incidental note made of small amount of ascites and right pleural effusion. Possible contour nodularity of the liver. IMPRESSION: 1. Echogenic kidneys  consistent with medical renal disease. No hydronephrosis 2. Small amount of ascites and small right pleural effusion 3. Possible contour nodularity of the liver as may be seen with cirrhosis. Electronically Signed   By: Donavan Foil M.D.   On: 09/30/2019 20:18   ECHOCARDIOGRAM COMPLETE  Result Date: 09/29/2019   ECHOCARDIOGRAM REPORT   Patient Name:   Logan White Date of Exam: 09/29/2019 Medical Rec #:  [id number]         Height:       75.5 in Accession #:    [id number]        Weight:       178.2 lb Date of Birth:  [birth year]         BSA:          2.10 m Patient Age:    79 years          BP:           117/92 mmHg Patient Gender: M                 HR:           94 bpm. Exam Location:  Inpatient Procedure: 2D Echo Indications:    Cardiomyopathy-Unsepcified  History:        Patient has no prior history of Echocardiogram examinations and                 Patient has prior history of Echocardiogram examinations, most                 recent 08/08/2019. CHF, Arrythmias:Atrial Fibrillation,                 Signs/Symptoms:Shortness of Breath; Risk Factors:Former Smoker.                 Ischemic cardiomyopathy. CKD.  Sonographer:    Clayton Lefort RDCS (AE) Referring Phys: [id number] [place]  Sonographer Comments: Image acquisition challenging due to respiratory motion. IMPRESSIONS  1. Left ventricular ejection fraction, by visual estimation, is 25 to 30%. The left ventricle has severely decreased function. There is mildly increased left ventricular hypertrophy.  2. Left ventricular diastolic function could not be evaluated.  3. Severely dilated left ventricular internal cavity size.  4. The left ventricle demonstrates global hypokinesis.  5. Global right ventricle has severely reduced systolic function.The right ventricular size is mildly enlarged.  6. Left atrial size was severely dilated.  7. The interatrial septum is aneurysmal.  8. Right atrial size was severely dilated.  9. The mitral valve is normal in  structure. Moderate mitral valve regurgitation. No evidence of mitral stenosis. 10. The tricuspid valve is normal in structure. Tricuspid valve regurgitation is mild. 11. The aortic valve is tricuspid. Aortic valve regurgitation is mild. Mild to moderate aortic valve sclerosis/calcification without any evidence of aortic stenosis. 12. The pulmonic valve was normal in structure. Pulmonic valve regurgitation is mild. 13. Moderately elevated pulmonary artery systolic pressure. 14. The inferior vena cava is dilated in size with <50% respiratory variability, suggesting right atrial pressure of 15 mmHg. 15. Severe global reduction in LV systolic function; mild LVH; 4 chamber enlargement; severe RV dysfunction; mild AI; moderate MR; mild TR; moderate pulmonary hypertension. FINDINGS  Left Ventricle: Left ventricular ejection fraction, by visual estimation, is 25 to 30%. The left ventricle has severely decreased function. The left ventricle demonstrates global hypokinesis. The left ventricular internal cavity size was severely dilated left ventricle. There is mildly increased left ventricular hypertrophy. The left ventricular diastology could not be evaluated due to atrial fibrillation. Left ventricular diastolic function could not be evaluated. Right Ventricle: The right ventricular size is mildly enlarged.Global RV systolic function is has severely reduced systolic function. The tricuspid regurgitant velocity is 2.66 m/s, and with an assumed right atrial pressure of 15 mmHg, the estimated right ventricular systolic pressure is moderately elevated at 43.3 mmHg. Left Atrium: Left atrial size was severely dilated. Right Atrium: Right atrial size was severely dilated Pericardium: There is no evidence of pericardial effusion. Mitral Valve: The mitral valve is normal in structure. There is mild thickening of the mitral valve leaflet(s). Moderate mitral valve regurgitation. No evidence of mitral valve stenosis by observation.  Tricuspid Valve: The tricuspid valve is normal in structure. Tricuspid valve regurgitation is mild. Aortic Valve: The aortic valve is tricuspid. Aortic valve regurgitation is mild. Aortic regurgitation PHT measures 643 msec. Mild to moderate aortic valve sclerosis/calcification is present, without any evidence of aortic stenosis. Pulmonic Valve: The pulmonic valve was normal in structure. Pulmonic valve regurgitation is mild. Pulmonic regurgitation is mild. Aorta: The aortic root is normal in size and structure. Venous: The inferior vena cava is dilated in size with less than 50% respiratory variability, suggesting right atrial pressure of 15 mmHg. IAS/Shunts: An The interatrial septum is aneurysmal is seen. Additional Comments: Severe global reduction in LV systolic function; mild LVH; 4 chamber enlargement; severe RV dysfunction; mild AI; moderate MR; mild TR; moderate pulmonary hypertension.  LEFT VENTRICLE PLAX 2D LVIDd:         6.50 cm LVIDs:         6.00 cm LV PW:         1.10 cm LV IVS:        1.20 cm LVOT diam:     2.00 cm  3D Volume EF: LV SV:         36 ml    3D EF:        34 % LV SV Index:   17.40    LV EDV:       233 ml LVOT Area:     3.14 cm LV ESV:       155 ml  LV SV:        78 ml RIGHT VENTRICLE            IVC RV Basal diam:  4.10 cm    IVC diam: 2.60 cm RV S prime:     9.00 cm/s TAPSE (M-mode): 1.6 cm LEFT ATRIUM              Index        RIGHT ATRIUM           Index LA diam:        6.70 cm  3.19 cm/m   RA Area:     35.80 cm LA Vol (A2C):   261.0 ml 124.30 ml/m RA Volume:   128.00 ml 60.96 ml/m LA Vol (A4C):   272.0 ml 129.54 ml/m LA Biplane Vol: 267.0 ml 127.16 ml/m  AORTIC VALVE AI PHT:      643 msec  AORTA Ao Root diam: 2.90 cm Ao Asc diam:  3.20 cm MR Peak grad:    102.4 mmHg  TRICUSPID VALVE MR Mean grad:    58.0 mmHg   TR Peak grad:   28.3 mmHg MR Vmax:         506.00 cm/s TR Vmax:        328.00 cm/s MR Vmean:        350.0 cm/s MR PISA:         5.09 cm    SHUNTS  MR PISA Eff ROA: 31 mm      Systemic Diam: 2.00 cm MR PISA Radius:  0.90 cm  Kirk Ruths MD Electronically signed by Kirk Ruths MD Signature Date/Time: 09/29/2019/10:17:51 AM    Final    Telemetry    10/01/2019 atrial fibrillation with HR 80s, PVCs- Personally Reviewed  ECG    No new tracing as of 10/01/2019- Personally Reviewed  Cardiac Studies   Echocardiogram 09/29/2019 1. Left ventricular ejection fraction, by visual estimation, is 25 to 30%. The left ventricle has severely decreased function. There is mildly increased left ventricular hypertrophy. 2. Left ventricular diastolic function could not be evaluated. 3. Severely dilated left ventricular internal cavity size. 4. The left ventricle demonstrates global hypokinesis. 5. Global right ventricle has severely reduced systolic function.The right ventricular size is mildly enlarged. 6. Left atrial size was severely dilated. 7. The interatrial septum is aneurysmal. 8. Right atrial size was severely dilated. 9. The mitral valve is normal in structure. Moderate mitral valve regurgitation. No evidence of mitral stenosis. 10. The tricuspid valve is normal in structure. Tricuspid valve regurgitation is mild. 11. The aortic valve is tricuspid. Aortic valve regurgitation is mild. Mild to moderate aortic valve sclerosis/calcification without any evidence of aortic stenosis. 12. The pulmonic valve was normal in structure. Pulmonic valve regurgitation is mild. 13. Moderately elevated pulmonary artery systolic pressure. 14. The inferior vena cava is dilated in size with <50% respiratory variability, suggesting right atrial pressure of 15 mmHg. 15. Severe global reduction in LV systolic function; mild LVH; 4 chamber enlargement; severe RV dysfunction; mild AI; moderate MR; mild TR; moderate pulmonary hypertension.  Patient Profile     79 y.o. male with a hx of chronic systolic CHF 2/2 ischemic CM, coronary artery disease s/p  priormyocardial infarction, prior VF arrest in the setting of hypokalemia, permanent atrial fibrillation, chronic kidney disease, peripheral arterial disease, mitral regurgitation, and COPD,who is being followed by cardiology for the evaluation of decompensatedcongestive heart failureand syncope  Assessment & Plan    1. Acute on chronic combined CHF:  -  LVEF found to be 25 to 30% per echocardiogram with severe LV dilation, mild LVH, global hypokinesis and severe RV failure. -BNP elevated at 2400>>initially given IV lasix, however on cardiology consult 09/29/19, he was felt to be euvolemmic and IV lasix held>> given trial high-dose Lasix IV 120 mg x 1 09/30/2019 with poor response -Weight, 182lb today with an admission weight of 178lb -I&O, net +66.3 mL -Creatinine, 3 0 today down from 2.48 on 09/30/2019 -Would consider RHC at this time given poor diuretic response -Continue metoprolol succinate -Unable to add ACEi/ARB/ARNI due to CKD  2. Syncope:  -Episode occurred while sitting on a stool in his bedroom with associated loss of bowels with unclear etiology>> felt to be orthostasis.  Has a history of VF arrest therefore is recommended for outpatient cardiac monitoring for further evaluation  -No recurrence  -Continue to telemetry monitoring -Will arrange outpatient cardiac monitor at discharge with close follow-up  3. CAD:  -EKG stable with no ischemic changes, no anginal complaints -Continue aspirin and atorvastatin -Continue metoprolol and Imdur  4. Permanent atrial fibrillation:  -Rates controlled, HR>>80s with PVCs -Continue metoprolol  -Currently on heparin infusion for anticoagulation in the setting of RHC   5. Mitral regurgitation:  -Noted to be moderate on echocardiogram, stable from prior study  -Continue routine outpatient monitoring  6. Hyperkalemia:  -Stable, 4.3 today -Monitor electrolytes closely given VF arrest history -Goal K>4  7. COPD:  -Likely a  contributor to SOB on presentation.  -Still with diffuse wheezing/rhochi on exam.  -IV steroids and nebulizers initiated. Likely driving respiratory failure.  -Continue management per primary team.  8.  Acute on chronic CKD stage III-IV:  -Creatinine, 2.30 today down from 2.48 on 09/30/2019  -Baseline appears to be in the 1.5-1.8 range -Continue to monitor closely with aggressive diuresis.   Signed, Kathyrn Drown NP-C [facility] Pager: [phone] 10/01/2019, 7:37 AM     For questions or updates, please contact   Please consult [email or web address] for contact info under Cardiology/STEMI.

## 2019-10-01 NOTE — Progress Notes (Signed)
PROGRESS NOTE    Logan White  EXH:[id number] DOB: 07-Jan-1941 DOA: 09/28/2019 PCP: Kelton Pillar, MD    Brief Narrative:  79 year old gentleman with extensive cardiac history including chronic systolic congestive heart failure with known ejection fraction of 30%, chronic A. fib on anticoagulation with Eliquis, previous V. fib arrest in 2014, ischemic cardiomyopathy, coronary artery disease, peripheral vascular disease and underlying COPD.  He is managed outpatient, recently increased dose of torsemide, despite that continued to have significant exertional dyspnea and orthopnea.  On the day of admission, he had an episode of severe shortness of breath on walking to the bathroom and near syncopal episode so EMS was called and he was brought to ER. In the emergency room 87% on room air.    Assessment & Plan:   Active Problems:   CHF (congestive heart failure), NYHA class IV, acute, systolic (HCC)  Acute hypoxic respiratory failure: multifactorial.  likely COPD exacerbation and combined acute on chronic congestive heart failure: Still with significant symptoms.  Echocardiogram with similar ejection fraction.   With wheezing and shortness of breath.   On intermittent Lasix by cardiology.  Possible right heart cath.   Also treated as COPD exacerbation with IV steroids, inhalational steroids, bronchodilators and chest physiotherapy. Keep on oxygen to keep saturations more than 89%.  Mobilize.  Hyperkalemia: Patient was on potassium supplements.  Kayexalate given.  Normalized.  Potassium more than 4.    Acute on chronic kidney disease stage III: historical creatinine 1.3-1.4. Worsening serum creatinine probably due to acute congestive heart failure.  Renal ultrasound with no obstruction or hydronephrosis.  Medical renal disease. Slight improvement of creatinine. Urine output measurement is not accurate, will do strict output measuring.  Chronic A. fib: Rate controlled.  Patient was on  Eliquis, changed to heparin in anticipation for cardiac cath.  Hypertension: Blood pressures fairly stable.  Chronic bronchitis with acute exacerbation: As above.  DVT prophylaxis: Heparin infusion. Code Status: Full code Family Communication: Patient talking with family. Disposition Plan: patient is from home.. Anticipated DC to home, Barriers to discharge active treatment , still on treatment with significant wheezing and shortness of breath and hypoxia. May also plan for cardiac cath.  Consultants:   Cardiology  Procedures:   None  Antimicrobials:   None   Subjective: No overnight events.  On 1 to 2 L of oxygen.  Still has significant wheezing.  Patient states that he feels tired even when eating breakfast. Objective: Vitals:   10/01/19 0414 10/01/19 0421 10/01/19 0508 10/01/19 0807  BP:   (!) 105/51   Pulse:   91   Resp:   20   Temp:   97.9 F (36.6 C)   TempSrc:   Oral   SpO2: 100% 100%  98%  Weight:      Height:        Intake/Output Summary (Last 24 hours) at 10/01/2019 1003 Last data filed at 10/01/2019 0600 Gross per 24 hour  Intake 376.34 ml  Output 100 ml  Net 276.34 ml   Filed Weights   09/28/19 1731 09/29/19 2200 10/01/19 0402  Weight: 80.8 kg 80.3 kg 82.6 kg    Examination:  General exam: Appears calm and comfortable, on 2 L of oxygen. Respiratory system: Bilateral end expiratory wheezes.  Occasional crackles.  Plenty of conducted airway sounds. Cardiovascular system: S1 & S2 heard, irregularly irregular.  Shiny bilateral legs.  Patient has peripheral vascular disease.  Mostly chronic edema. Gastrointestinal system: Abdomen is nondistended, soft and nontender. . Central  nervous system: Alert and oriented. No focal neurological deficits. Extremities: Symmetric 5 x 5 power. Skin: No rashes, lesions or ulcers Psychiatry: Judgement and insight appear normal. Mood & affect appropriate.     Data Reviewed: I have personally reviewed following labs  and imaging studies  CBC: Recent Labs  Lab 09/28/19 1829 09/29/19 0506 10/01/19 0557  WBC 5.8 6.2 8.4  NEUTROABS 2.8  --   --   HGB 15.3 13.7 12.2*  HCT 43.7 39.3 34.8*  MCV 76.1* 75.9* 74.8*  PLT 185 147* 809*   Basic Metabolic Panel: Recent Labs  Lab 09/28/19 1829 09/28/19 2342 09/29/19 0506 09/30/19 0455 10/01/19 0557  NA 133*  --  135 133* 132*  K 5.6* 5.5* 4.6 3.9 4.3  CL 99  --  101 98 97*  CO2 23  --  23 25 24   GLUCOSE 105*  --  119* 174* 148*  BUN 49*  --  51* 51* 65*  CREATININE 2.60*  --  2.50* 2.48* 2.30*  CALCIUM 9.2  --  8.7* 8.6* 8.8*  MG  --   --   --  2.3  --   PHOS  --   --   --  5.4*  --    GFR: Estimated Creatinine Clearance: 31.9 mL/min (A) (by C-G formula based on SCr of 2.3 mg/dL (H)). Liver Function Tests: Recent Labs  Lab 09/28/19 1829  AST 32  ALT 25  ALKPHOS 186*  BILITOT 2.9*  PROT 8.0  ALBUMIN 3.8   No results for input(s): LIPASE, AMYLASE in the last 168 hours. No results for input(s): AMMONIA in the last 168 hours. Coagulation Profile: Recent Labs  Lab 09/30/19 1754  INR 2.5*   Cardiac Enzymes: No results for input(s): CKTOTAL, CKMB, CKMBINDEX, TROPONINI in the last 168 hours. BNP (last 3 results) Recent Labs    08/15/19 0949  PROBNP 16,511*   HbA1C: No results for input(s): HGBA1C in the last 72 hours. CBG: No results for input(s): GLUCAP in the last 168 hours. Lipid Profile: No results for input(s): CHOL, HDL, LDLCALC, TRIG, CHOLHDL, LDLDIRECT in the last 72 hours. Thyroid Function Tests: No results for input(s): TSH, T4TOTAL, FREET4, T3FREE, THYROIDAB in the last 72 hours. Anemia Panel: No results for input(s): VITAMINB12, FOLATE, FERRITIN, TIBC, IRON, RETICCTPCT in the last 72 hours. Sepsis Labs: No results for input(s): PROCALCITON, LATICACIDVEN in the last 168 hours.  Recent Results (from the past 240 hour(s))  Respiratory Panel by RT PCR (Flu A&B, Covid) - Nasopharyngeal Swab     Status: None    Collection Time: 09/28/19  6:29 PM   Specimen: Nasopharyngeal Swab  Result Value Ref Range Status   SARS Coronavirus 2 by RT PCR NEGATIVE NEGATIVE Final    Comment: (NOTE) SARS-CoV-2 target nucleic acids are NOT DETECTED. The SARS-CoV-2 RNA is generally detectable in upper respiratoy specimens during the acute phase of infection. The lowest concentration of SARS-CoV-2 viral copies this assay can detect is 131 copies/mL. A negative result does not preclude SARS-Cov-2 infection and should not be used as the sole basis for treatment or other patient management decisions. A negative result may occur with  improper specimen collection/handling, submission of specimen other than nasopharyngeal swab, presence of viral mutation(s) within the areas targeted by this assay, and inadequate number of viral copies (<131 copies/mL). A negative result must be combined with clinical observations, patient history, and epidemiological information. The expected result is Negative. Fact Sheet for Patients:  PinkCheek.be Fact Sheet for Healthcare Providers:  GravelBags.it This test is not yet ap proved or cleared by the [place] and  has been authorized for detection and/or diagnosis of SARS-CoV-2 by FDA under an Emergency Use Authorization (EUA). This EUA will remain  in effect (meaning this test can be used) for the duration of the COVID-19 declaration under Section 564(b)(1) of the Act, 21 U.S.C. section 360bbb-3(b)(1), unless the authorization is terminated or revoked sooner.    Influenza A by PCR NEGATIVE NEGATIVE Final   Influenza B by PCR NEGATIVE NEGATIVE Final    Comment: (NOTE) The Xpert Xpress SARS-CoV-2/FLU/RSV assay is intended as an aid in  the diagnosis of influenza from Nasopharyngeal swab specimens and  should not be used as a sole basis for treatment. Nasal washings and  aspirates are unacceptable for Xpert Xpress  SARS-CoV-2/FLU/RSV  testing. Fact Sheet for Patients: PinkCheek.be Fact Sheet for Healthcare Providers: GravelBags.it This test is not yet approved or cleared by the [place] FDA and  has been authorized for detection and/or diagnosis of SARS-CoV-2 by  FDA under an Emergency Use Authorization (EUA). This EUA will remain  in effect (meaning this test can be used) for the duration of the  Covid-19 declaration under Section 564(b)(1) of the Act, 21  U.S.C. section 360bbb-3(b)(1), unless the authorization is  terminated or revoked. Performed at [facility], [place] [address] [place], [place] [address]          Radiology Studies: US RENAL  Result Date: 09/30/2019 CLINICAL DATA:  Acute on chronic renal failure EXAM: RENAL / URINARY TRACT ULTRASOUND COMPLETE COMPARISON:  11/09/2017 FINDINGS: Right Kidney: Renal measurements: 9.6 x 3.9 x 4.6 cm = volume: 90.6 mL. Cortex is echogenic. No hydronephrosis or mass Left Kidney: Renal measurements: 11.5 x 5.3 x 5.6 cm = volume: 177.5 mL. Cortex is echogenic. No hydronephrosis or mass Bladder: Appears normal for degree of bladder distention. Other: Incidental note made of small amount of ascites and right pleural effusion. Possible contour nodularity of the liver. IMPRESSION: 1. Echogenic kidneys consistent with medical renal disease. No hydronephrosis 2. Small amount of ascites and small right pleural effusion 3. Possible contour nodularity of the liver as may be seen with cirrhosis. Electronically Signed   By: Donavan Foil M.D.   On: 09/30/2019 20:18        Scheduled Meds: . albuterol  2.5 mg Nebulization BID  . aspirin EC  81 mg Oral Daily  . atorvastatin  80 mg Oral q1800  . Chlorhexidine Gluconate Cloth  6 each Topical Q0600  . hydrALAZINE  10 mg Oral BID  . isosorbide mononitrate  30 mg Oral Daily  . methylPREDNISolone (SOLU-MEDROL) injection  40 mg  Intravenous BID  . metoprolol succinate  100 mg Oral Daily  . mometasone-formoterol  2 puff Inhalation BID  . sodium chloride flush  3 mL Intravenous Q12H   Continuous Infusions: . sodium chloride    . heparin 1,150 Units/hr (10/01/19 0800)     LOS: 3 days    Time spent: 30 minutes    Barb Merino, MD [facility] Pager [phone]

## 2019-10-01 NOTE — Progress Notes (Signed)
Notified by NT that patient had a nosebleed. Upon assessment, visibly saw a small amount of bright red blood present on dinner napkin. Patient stated there was more output of blood. No signs  Or symptoms of acute distress noted. Attending physician notified. Will continue to monitor.

## 2019-10-01 NOTE — Interval H&P Note (Signed)
Cath Lab Visit (complete for each Cath Lab visit)  Clinical Evaluation Leading to the Procedure:   ACS: No.  Non-ACS:    Anginal Classification: No Symptoms  Anti-ischemic medical therapy: No Therapy  Non-Invasive Test Results: No non-invasive testing performed  Prior CABG: No previous CABG      History and Physical Interval Note:  10/01/2019 7:57 PM  Logan White  has presented today for surgery, with the diagnosis of heart failure.  The various methods of treatment have been discussed with the patient and family. After consideration of risks, benefits and other options for treatment, the patient has consented to  Procedure(s): RIGHT HEART CATH (N/A) as a surgical intervention.  The patient's history has been reviewed, patient examined, no change in status, stable for surgery.  I have reviewed the patient's chart and labs.  Questions were answered to the patient's satisfaction.     [name]

## 2019-10-01 NOTE — Evaluation (Signed)
Physical Therapy Evaluation Patient Details Name: Logan White MRN: [id number] DOB: [birth year] Today's Date: 10/01/2019   History of Present Illness  79 yo male admitted with CHF, syncope. Hx of DM, DVA, PAF, AAA, bradycardia, CKD  Clinical Impression  On eval, pt was Min guard assist for mobility. He walked ~175'x2 with his straight cane. O2: 94% on RA during session. Wheezing and dyspnea noted during session. Will follow during hospital stay. Do not anticipate any f/u PT needs at this time.     Follow Up Recommendations No PT follow up    Equipment Recommendations  None recommended by PT    Recommendations for Other Services       Precautions / Restrictions Precautions Precautions: Fall Restrictions Weight Bearing Restrictions: No      Mobility  Bed Mobility Overal bed mobility: Modified Independent             General bed mobility comments: rest breaks needed while sitting EOB and donning socks  Transfers Overall transfer level: Needs assistance   Transfers: Sit to/from Stand Sit to Stand: Supervision         General transfer comment: for safety  Ambulation/Gait Ambulation/Gait assistance: Min guard Gait Distance (Feet): 175 Feet(x2) Assistive device: Straight cane Gait Pattern/deviations: Step-through pattern     General Gait Details: for safety. Seated rest break taken midway. O2 94% on RA. Audible wheezing. Dyspnea 2/4.  Stairs            Wheelchair Mobility    Modified Rankin (Stroke Patients Only)       Balance Overall balance assessment: Mild deficits observed, not formally tested                                           Pertinent Vitals/Pain Pain Assessment: No/denies pain    Home Living Family/patient expects to be discharged to:: Private residence Living Arrangements: Spouse/significant other;Children Available Help at Discharge: Family Type of Home: Apartment Home Access: Level entry     Home  Layout: One level Home Equipment: Kasandra Knudsen - single point      Prior Function Level of Independence: Independent with assistive device(s)         Comments: uses cane     Hand Dominance        Extremity/Trunk Assessment   Upper Extremity Assessment Upper Extremity Assessment: Overall WFL for tasks assessed    Lower Extremity Assessment Lower Extremity Assessment: Generalized weakness    Cervical / Trunk Assessment Cervical / Trunk Assessment: Normal  Communication   Communication: No difficulties  Cognition Arousal/Alertness: Awake/alert Behavior During Therapy: WFL for tasks assessed/performed Overall Cognitive Status: Within Functional Limits for tasks assessed                                        General Comments      Exercises     Assessment/Plan    PT Assessment Patient needs continued PT services  PT Problem List Decreased mobility;Decreased activity tolerance       PT Treatment Interventions Gait training;Therapeutic activities;Therapeutic exercise;Patient/family education;Functional mobility training    PT Goals (Current goals can be found in the Care Plan section)  Acute Rehab PT Goals Patient Stated Goal: none stated PT Goal Formulation: With patient Time For Goal Achievement: 10/15/19 Potential to Achieve Goals:  Good    Frequency Min 3X/week   Barriers to discharge        Co-evaluation               AM-PAC PT "6 Clicks" Mobility  Outcome Measure Help needed turning from your back to your side while in a flat bed without using bedrails?: None Help needed moving from lying on your back to sitting on the side of a flat bed without using bedrails?: None Help needed moving to and from a bed to a chair (including a wheelchair)?: A Little Help needed standing up from a chair using your arms (e.g., wheelchair or bedside chair)?: A Little Help needed to walk in hospital room?: A Little Help needed climbing 3-5 steps with a  railing? : A Little 6 Click Score: 20    End of Session Equipment Utilized During Treatment: Gait belt Activity Tolerance: Patient tolerated treatment well Patient left: in chair;with call bell/phone within reach   PT Visit Diagnosis: Unsteadiness on feet (R26.81)    Time: 2836-6294 PT Time Calculation (min) (ACUTE ONLY): 23 min   Charges:   PT Evaluation $PT Eval Moderate Complexity: 1 Mod            Jess Toney P, PT Acute Rehabilitation

## 2019-10-01 NOTE — H&P (View-Only) (Signed)
Progress Note  Patient Name: Logan White Date of Encounter: 10/01/2019  Primary Cardiologist: Kirk Ruths, MD   Subjective   Reports breathing has improved however has expiratory wheezing while eating breakfast this a.m.  Poor output despite high-dose IV Lasix.  Denies chest pain.  Inpatient Medications    Scheduled Meds: . albuterol  2.5 mg Nebulization BID  . aspirin EC  81 mg Oral Daily  . atorvastatin  80 mg Oral q1800  . Chlorhexidine Gluconate Cloth  6 each Topical Q0600  . hydrALAZINE  10 mg Oral BID  . isosorbide mononitrate  30 mg Oral Daily  . methylPREDNISolone (SOLU-MEDROL) injection  40 mg Intravenous BID  . metoprolol succinate  100 mg Oral Daily  . mometasone-formoterol  2 puff Inhalation BID  . sodium chloride flush  3 mL Intravenous Q12H   Continuous Infusions: . sodium chloride    . heparin 1,000 Units/hr (09/30/19 2230)   PRN Meds: sodium chloride, acetaminophen, albuterol, nitroGLYCERIN, sodium chloride flush   Vital Signs    Vitals:   10/01/19 0402 10/01/19 0414 10/01/19 0421 10/01/19 0508  BP:    (!) 105/51  Pulse:    91  Resp:    20  Temp:    97.9 F (36.6 C)  TempSrc:    Oral  SpO2:  100% 100%   Weight: 82.6 kg     Height:        Intake/Output Summary (Last 24 hours) at 10/01/2019 0737 Last data filed at 10/01/2019 0600 Gross per 24 hour  Intake 576.34 ml  Output 100 ml  Net 476.34 ml   Filed Weights   09/28/19 1731 09/29/19 2200 10/01/19 0402  Weight: 80.8 kg 80.3 kg 82.6 kg    Physical Exam   General: Well developed, well nourished, NA Neck: Negative for carotid bruits. + JVD Lungs: Diminished in upper lobes with rhonchi in lower lobes.  Breathing is labored Cardiovascular: Irregularly irregular with S1 S2. No murmurs Abdomen: Soft, non-tender, non-distended. No obvious abdominal masses. Extremities: Mild BLE edema. DP pulses 2+ bilaterally Neuro: Alert and oriented. No focal deficits. No facial asymmetry. MAE  spontaneously. Psych: Responds to questions appropriately with normal affect.    Labs    Chemistry Recent Labs  Lab 09/28/19 1829 09/28/19 2342 09/29/19 0506 09/30/19 0455 10/01/19 0557  NA 133*  --  135 133* 132*  K 5.6*   < > 4.6 3.9 4.3  CL 99  --  101 98 97*  CO2 23  --  23 25 24   GLUCOSE 105*  --  119* 174* 148*  BUN 49*  --  51* 51* 65*  CREATININE 2.60*  --  2.50* 2.48* 2.30*  CALCIUM 9.2  --  8.7* 8.6* 8.8*  PROT 8.0  --   --   --   --   ALBUMIN 3.8  --   --   --   --   AST 32  --   --   --   --   ALT 25  --   --   --   --   ALKPHOS 186*  --   --   --   --   BILITOT 2.9*  --   --   --   --   GFRNONAA 23*  --  24* 24* 27*  GFRAA 27*  --  28* 28* 31*  ANIONGAP 11  --  11 10 11    < > = values in this interval not displayed.  Hematology Recent Labs  Lab 09/28/19 1829 09/29/19 0506 10/01/19 0557  WBC 5.8 6.2 8.4  RBC 5.74 5.18 4.65  HGB 15.3 13.7 12.2*  HCT 43.7 39.3 34.8*  MCV 76.1* 75.9* 74.8*  MCH 26.7 26.4 26.2  MCHC 35.0 34.9 35.1  RDW 17.0* 16.6* 16.2*  PLT 185 147* 117*    Cardiac EnzymesNo results for input(s): TROPONINI in the last 168 hours. No results for input(s): TROPIPOC in the last 168 hours.   BNP Recent Labs  Lab 09/28/19 1829  BNP 2,423.4*     DDimer No results for input(s): DDIMER in the last 168 hours.   Radiology    US RENAL  Result Date: 09/30/2019 CLINICAL DATA:  Acute on chronic renal failure EXAM: RENAL / URINARY TRACT ULTRASOUND COMPLETE COMPARISON:  11/09/2017 FINDINGS: Right Kidney: Renal measurements: 9.6 x 3.9 x 4.6 cm = volume: 90.6 mL. Cortex is echogenic. No hydronephrosis or mass Left Kidney: Renal measurements: 11.5 x 5.3 x 5.6 cm = volume: 177.5 mL. Cortex is echogenic. No hydronephrosis or mass Bladder: Appears normal for degree of bladder distention. Other: Incidental note made of small amount of ascites and right pleural effusion. Possible contour nodularity of the liver. IMPRESSION: 1. Echogenic kidneys  consistent with medical renal disease. No hydronephrosis 2. Small amount of ascites and small right pleural effusion 3. Possible contour nodularity of the liver as may be seen with cirrhosis. Electronically Signed   By: Donavan Foil M.D.   On: 09/30/2019 20:18   ECHOCARDIOGRAM COMPLETE  Result Date: 09/29/2019   ECHOCARDIOGRAM REPORT   Patient Name:   Logan White Date of Exam: 09/29/2019 Medical Rec #:  [id number]         Height:       75.5 in Accession #:    [id number]        Weight:       178.2 lb Date of Birth:  05-01-1941         BSA:          2.10 m Patient Age:    79 years          BP:           117/92 mmHg Patient Gender: M                 HR:           94 bpm. Exam Location:  Inpatient Procedure: 2D Echo Indications:    Cardiomyopathy-Unsepcified  History:        Patient has no prior history of Echocardiogram examinations and                 Patient has prior history of Echocardiogram examinations, most                 recent 08/08/2019. CHF, Arrythmias:Atrial Fibrillation,                 Signs/Symptoms:Shortness of Breath; Risk Factors:Former Smoker.                 Ischemic cardiomyopathy. CKD.  Sonographer:    Clayton Lefort RDCS (AE) Referring Phys: [id number] [place]  Sonographer Comments: Image acquisition challenging due to respiratory motion. IMPRESSIONS  1. Left ventricular ejection fraction, by visual estimation, is 25 to 30%. The left ventricle has severely decreased function. There is mildly increased left ventricular hypertrophy.  2. Left ventricular diastolic function could not be evaluated.  3. Severely dilated left ventricular internal cavity size.  4. The left ventricle demonstrates global hypokinesis.  5. Global right ventricle has severely reduced systolic function.The right ventricular size is mildly enlarged.  6. Left atrial size was severely dilated.  7. The interatrial septum is aneurysmal.  8. Right atrial size was severely dilated.  9. The mitral valve is normal in  structure. Moderate mitral valve regurgitation. No evidence of mitral stenosis. 10. The tricuspid valve is normal in structure. Tricuspid valve regurgitation is mild. 11. The aortic valve is tricuspid. Aortic valve regurgitation is mild. Mild to moderate aortic valve sclerosis/calcification without any evidence of aortic stenosis. 12. The pulmonic valve was normal in structure. Pulmonic valve regurgitation is mild. 13. Moderately elevated pulmonary artery systolic pressure. 14. The inferior vena cava is dilated in size with <50% respiratory variability, suggesting right atrial pressure of 15 mmHg. 15. Severe global reduction in LV systolic function; mild LVH; 4 chamber enlargement; severe RV dysfunction; mild AI; moderate MR; mild TR; moderate pulmonary hypertension. FINDINGS  Left Ventricle: Left ventricular ejection fraction, by visual estimation, is 25 to 30%. The left ventricle has severely decreased function. The left ventricle demonstrates global hypokinesis. The left ventricular internal cavity size was severely dilated left ventricle. There is mildly increased left ventricular hypertrophy. The left ventricular diastology could not be evaluated due to atrial fibrillation. Left ventricular diastolic function could not be evaluated. Right Ventricle: The right ventricular size is mildly enlarged.Global RV systolic function is has severely reduced systolic function. The tricuspid regurgitant velocity is 2.66 m/s, and with an assumed right atrial pressure of 15 mmHg, the estimated right ventricular systolic pressure is moderately elevated at 43.3 mmHg. Left Atrium: Left atrial size was severely dilated. Right Atrium: Right atrial size was severely dilated Pericardium: There is no evidence of pericardial effusion. Mitral Valve: The mitral valve is normal in structure. There is mild thickening of the mitral valve leaflet(s). Moderate mitral valve regurgitation. No evidence of mitral valve stenosis by observation.  Tricuspid Valve: The tricuspid valve is normal in structure. Tricuspid valve regurgitation is mild. Aortic Valve: The aortic valve is tricuspid. Aortic valve regurgitation is mild. Aortic regurgitation PHT measures 643 msec. Mild to moderate aortic valve sclerosis/calcification is present, without any evidence of aortic stenosis. Pulmonic Valve: The pulmonic valve was normal in structure. Pulmonic valve regurgitation is mild. Pulmonic regurgitation is mild. Aorta: The aortic root is normal in size and structure. Venous: The inferior vena cava is dilated in size with less than 50% respiratory variability, suggesting right atrial pressure of 15 mmHg. IAS/Shunts: An The interatrial septum is aneurysmal is seen. Additional Comments: Severe global reduction in LV systolic function; mild LVH; 4 chamber enlargement; severe RV dysfunction; mild AI; moderate MR; mild TR; moderate pulmonary hypertension.  LEFT VENTRICLE PLAX 2D LVIDd:         6.50 cm LVIDs:         6.00 cm LV PW:         1.10 cm LV IVS:        1.20 cm LVOT diam:     2.00 cm  3D Volume EF: LV SV:         36 ml    3D EF:        34 % LV SV Index:   17.40    LV EDV:       233 ml LVOT Area:     3.14 cm LV ESV:       155 ml  LV SV:        78 ml RIGHT VENTRICLE            IVC RV Basal diam:  4.10 cm    IVC diam: 2.60 cm RV S prime:     9.00 cm/s TAPSE (M-mode): 1.6 cm LEFT ATRIUM              Index        RIGHT ATRIUM           Index LA diam:        6.70 cm  3.19 cm/m   RA Area:     35.80 cm LA Vol (A2C):   261.0 ml 124.30 ml/m RA Volume:   128.00 ml 60.96 ml/m LA Vol (A4C):   272.0 ml 129.54 ml/m LA Biplane Vol: 267.0 ml 127.16 ml/m  AORTIC VALVE AI PHT:      643 msec  AORTA Ao Root diam: 2.90 cm Ao Asc diam:  3.20 cm MR Peak grad:    102.4 mmHg  TRICUSPID VALVE MR Mean grad:    58.0 mmHg   TR Peak grad:   28.3 mmHg MR Vmax:         506.00 cm/s TR Vmax:        328.00 cm/s MR Vmean:        350.0 cm/s MR PISA:         5.09 cm    SHUNTS  MR PISA Eff ROA: 31 mm      Systemic Diam: 2.00 cm MR PISA Radius:  0.90 cm  Kirk Ruths MD Electronically signed by Kirk Ruths MD Signature Date/Time: 09/29/2019/10:17:51 AM    Final    Telemetry    10/01/2019 atrial fibrillation with HR 80s, PVCs- Personally Reviewed  ECG    No new tracing as of 10/01/2019- Personally Reviewed  Cardiac Studies   Echocardiogram 09/29/2019 1. Left ventricular ejection fraction, by visual estimation, is 25 to 30%. The left ventricle has severely decreased function. There is mildly increased left ventricular hypertrophy. 2. Left ventricular diastolic function could not be evaluated. 3. Severely dilated left ventricular internal cavity size. 4. The left ventricle demonstrates global hypokinesis. 5. Global right ventricle has severely reduced systolic function.The right ventricular size is mildly enlarged. 6. Left atrial size was severely dilated. 7. The interatrial septum is aneurysmal. 8. Right atrial size was severely dilated. 9. The mitral valve is normal in structure. Moderate mitral valve regurgitation. No evidence of mitral stenosis. 10. The tricuspid valve is normal in structure. Tricuspid valve regurgitation is mild. 11. The aortic valve is tricuspid. Aortic valve regurgitation is mild. Mild to moderate aortic valve sclerosis/calcification without any evidence of aortic stenosis. 12. The pulmonic valve was normal in structure. Pulmonic valve regurgitation is mild. 13. Moderately elevated pulmonary artery systolic pressure. 14. The inferior vena cava is dilated in size with <50% respiratory variability, suggesting right atrial pressure of 15 mmHg. 15. Severe global reduction in LV systolic function; mild LVH; 4 chamber enlargement; severe RV dysfunction; mild AI; moderate MR; mild TR; moderate pulmonary hypertension.  Patient Profile     79 y.o. male with a hx of chronic systolic CHF 2/2 ischemic CM, coronary artery disease s/p  priormyocardial infarction, prior VF arrest in the setting of hypokalemia, permanent atrial fibrillation, chronic kidney disease, peripheral arterial disease, mitral regurgitation, and COPD,who is being followed by cardiology for the evaluation of decompensatedcongestive heart failureand syncope  Assessment & Plan    1. Acute on chronic combined CHF:  -  LVEF found to be 25 to 30% per echocardiogram with severe LV dilation, mild LVH, global hypokinesis and severe RV failure. -BNP elevated at 2400>>initially given IV lasix, however on cardiology consult 09/29/19, he was felt to be euvolemmic and IV lasix held>> given trial high-dose Lasix IV 120 mg x 1 09/30/2019 with poor response -Weight, 182lb today with an admission weight of 178lb -I&O, net +66.3 mL -Creatinine, 3 0 today down from 2.48 on 09/30/2019 -Would consider RHC at this time given poor diuretic response -Continue metoprolol succinate -Unable to add ACEi/ARB/ARNI due to CKD  2. Syncope:  -Episode occurred while sitting on a stool in his bedroom with associated loss of bowels with unclear etiology>> felt to be orthostasis.  Has a history of VF arrest therefore is recommended for outpatient cardiac monitoring for further evaluation  -No recurrence  -Continue to telemetry monitoring -Will arrange outpatient cardiac monitor at discharge with close follow-up  3. CAD:  -EKG stable with no ischemic changes, no anginal complaints -Continue aspirin and atorvastatin -Continue metoprolol and Imdur  4. Permanent atrial fibrillation:  -Rates controlled, HR>>80s with PVCs -Continue metoprolol  -Currently on heparin infusion for anticoagulation in the setting of RHC   5. Mitral regurgitation:  -Noted to be moderate on echocardiogram, stable from prior study  -Continue routine outpatient monitoring  6. Hyperkalemia:  -Stable, 4.3 today -Monitor electrolytes closely given VF arrest history -Goal K>4  7. COPD:  -Likely a  contributor to SOB on presentation.  -Still with diffuse wheezing/rhochi on exam.  -IV steroids and nebulizers initiated. Likely driving respiratory failure.  -Continue management per primary team.  8.  Acute on chronic CKD stage III-IV:  -Creatinine, 2.30 today down from 2.48 on 09/30/2019  -Baseline appears to be in the 1.5-1.8 range -Continue to monitor closely with aggressive diuresis.   Signed, Kathyrn Drown NP-C [facility] Pager: [phone] 10/01/2019, 7:37 AM     For questions or updates, please contact   Please consult [email or web address] for contact info under Cardiology/STEMI.

## 2019-10-01 NOTE — Progress Notes (Addendum)
ANTICOAGULATION CONSULT NOTE  Pharmacy Consult for IV heparin Indication: Afib while off Eliquis  No Known Allergies  Patient Measurements: Height: 6' 3.5" (191.8 cm) Weight: 182 lb (82.6 kg) IBW/kg (Calculated) : 85.65 Heparin Dosing Weight: TBW  Vital Signs: Temp: 97.9 F (36.6 C) (01/26 0508) Temp Source: Oral (01/26 0508) BP: 105/51 (01/26 0508) Pulse Rate: 91 (01/26 0508)  Labs: Recent Labs    09/28/19 1829 09/28/19 1829 09/29/19 0506 09/30/19 0455 09/30/19 1754 10/01/19 0557  HGB 15.3   < > 13.7  --   --  12.2*  HCT 43.7  --  39.3  --   --  34.8*  PLT 185  --  147*  --   --  117*  APTT  --   --   --   --  35 53*  LABPROT  --   --   --   --  26.6*  --   INR  --   --   --   --  2.5*  --   HEPARINUNFRC  --   --   --   --  >2.20*  --   CREATININE 2.60*   < > 2.50* 2.48*  --  2.30*   < > = values in this interval not displayed.    Estimated Creatinine Clearance: 31.9 mL/min (A) (by C-G formula based on SCr of 2.3 mg/dL (H)).   Medications:  Medications Prior to Admission  Medication Sig Dispense Refill Last Dose  . acetaminophen (TYLENOL) 325 MG tablet Take 650 mg by mouth every 6 (six) hours as needed for headache.   Past Month at Unknown time  . albuterol (VENTOLIN HFA) 108 (90 Base) MCG/ACT inhaler Inhale 2 puffs into the lungs every 6 (six) hours as needed for wheezing or shortness of breath. 90 g 3 09/28/2019 at Unknown time  . apixaban (ELIQUIS) 5 MG TABS tablet Take 1 tablet (5 mg total) by mouth 2 (two) times daily. 60 tablet 6 09/28/2019 at 0830  . atorvastatin (LIPITOR) 80 MG tablet Take 80 mg by mouth daily.   09/28/2019 at Unknown time  . Ensure (ENSURE) Take 237 mLs by mouth 2 (two) times daily as needed (nutritional supplement).   09/27/2019 at Unknown time  . hydrALAZINE (APRESOLINE) 10 MG tablet Take 1 tablet (10 mg total) by mouth 2 (two) times daily. 180 tablet 0 09/28/2019 at am  . metoprolol succinate (TOPROL-XL) 100 MG 24 hr tablet Take 1 tablet  (100 mg total) by mouth daily. 30 tablet 0 09/28/2019 at 0830  . nitroGLYCERIN (NITROSTAT) 0.4 MG SL tablet Place 1 tablet (0.4 mg total) under the tongue every 5 (five) minutes as needed for chest pain (Up to 3 doses). 25 tablet 3 Unknown at unknown  . potassium chloride SA (KLOR-CON) 20 MEQ tablet Take 20 mEq by mouth 2 (two) times daily.   09/28/2019 at am  . torsemide (DEMADEX) 20 MG tablet Take 2 tablets (40 mg total) by mouth 2 (two) times daily. 360 tablet 3 09/28/2019 at am  . isosorbide mononitrate (IMDUR) 30 MG 24 hr tablet Take 1 tablet (30 mg total) by mouth daily. (Patient not taking: Reported on 09/28/2019) 30 tablet 0 Not Taking at Unknown time   Scheduled:  . albuterol  2.5 mg Nebulization BID  . aspirin EC  81 mg Oral Daily  . atorvastatin  80 mg Oral q1800  . Chlorhexidine Gluconate Cloth  6 each Topical Q0600  . hydrALAZINE  10 mg Oral BID  . isosorbide  mononitrate  30 mg Oral Daily  . methylPREDNISolone (SOLU-MEDROL) injection  40 mg Intravenous BID  . metoprolol succinate  100 mg Oral Daily  . mometasone-formoterol  2 puff Inhalation BID  . sodium chloride flush  3 mL Intravenous Q12H   Infusions:  . sodium chloride    . heparin 1,000 Units/hr (09/30/19 2230)    Assessment: 6 yoM with PMH CHF, CAD, permanent AFib on Eliquis, CKD, PAD, admitted for AECHF. May need RH cath this admission, so Pharmacy consulted to dose IV heparin for bridging   Baseline INR and heparin level elevated d/t Eliquis, aPTT WNL   Prior anticoagulation: Eliquis 5 mg daily; last dose 1/25 at 10am  Significant events: 1/25 overnight: RN noted single episode of epistaxis which resolved with humidified air added to [place]  Today, 10/01/2019:  CBC: Hgb and Plt both slightly low, likely dilutional component  Note acute on chronic AKI this admission - SCr slowly improving  Most recent aPTT slightly SUBtherapeutic on 1000 units/hr  RN noted epistaxis overnight which resolved with humidified air;  no other bleeding issues. Possibly some degree of Eliquis accumulation d/t acute on chronic kidney renal dysfunction, but no guidance currently exists to delay timing of heparin start after last DOAC dose  No pauses in heparin infusion or issues with IV site/lines/pump  Goal of Therapy: APTT 66-102 sec Heparin level 0.3-0.7 units/ml Monitor platelets by anticoagulation protocol: Yes  Plan:  Increase heparin infusion to 1150 units/hr  Check aPTT 8 hrs after rate increase  Daily CBC and heparin level; aPTT as needed while DOAC effects on heparin level persist  Monitor closely for signs of new bleeding or thrombosis  Reuel Boom, PharmD, BCPS [phone] 10/01/2019, 7:26 AM

## 2019-10-01 NOTE — Progress Notes (Signed)
Pharmacy: [facility]  Patient's a 79 y.o M with hx Afib on Eliquis PTA, presented to the ED on 1/23 with c/o SOB and syncope. With plan for heart cath for evaluation of heart failure, he was switched to heparin drip on 1/25.  - APTT is therapeutic at 94 sec (goal 66-102 sec) after rate increased to 1150 units/hr earlier today - RN reported pt had an episode of epistaxis (small amount of blood) around 4pm after blowing his nose.  She reported no further bleeding noted since that event.  Plan: - continue heparin drip at 1150 units/hr - f/u with AM labs - monitor for s/s bleeding   Dia Sitter, PharmD, BCPS 10/01/2019 6:07 PM

## 2019-10-02 ENCOUNTER — Encounter (HOSPITAL_COMMUNITY)
Admission: EM | Disposition: A | Discharge status: Home Health | Payer: Self-pay | Source: Home / Self Care | Attending: Internal Medicine

## 2019-10-02 HISTORY — PX: RIGHT HEART CATH: CATH118263

## 2019-10-02 LAB — GLUCOSE, CAPILLARY: Glucose-Capillary: 128 mg/dL — ABNORMAL HIGH (ref 70–99)

## 2019-10-02 LAB — POCT I-STAT EG7
Acid-Base Excess: 8 mmol/L — ABNORMAL HIGH (ref 0.0–2.0)
Acid-Base Excess: 8 mmol/L — ABNORMAL HIGH (ref 0.0–2.0)
Bicarbonate: 33.1 mmol/L — ABNORMAL HIGH (ref 20.0–28.0)
Bicarbonate: 33.6 mmol/L — ABNORMAL HIGH (ref 20.0–28.0)
Calcium, Ion: 1.2 mmol/L (ref 1.15–1.40)
Calcium, Ion: 1.2 mmol/L (ref 1.15–1.40)
HCT: 38 % — ABNORMAL LOW (ref 39.0–52.0)
HCT: 39 % (ref 39.0–52.0)
Hemoglobin: 12.9 g/dL — ABNORMAL LOW (ref 13.0–17.0)
Hemoglobin: 13.3 g/dL (ref 13.0–17.0)
O2 Saturation: 56 %
O2 Saturation: 57 %
Potassium: 3.2 mmol/L — ABNORMAL LOW (ref 3.5–5.1)
Potassium: 3.2 mmol/L — ABNORMAL LOW (ref 3.5–5.1)
Sodium: 136 mmol/L (ref 135–145)
Sodium: 137 mmol/L (ref 135–145)
TCO2: 35 mmol/L — ABNORMAL HIGH (ref 22–32)
TCO2: 35 mmol/L — ABNORMAL HIGH (ref 22–32)
pCO2, Ven: 49.1 mmHg (ref 44.0–60.0)
pCO2, Ven: 50.3 mmHg (ref 44.0–60.0)
pH, Ven: 7.433 — ABNORMAL HIGH (ref 7.250–7.430)
pH, Ven: 7.438 — ABNORMAL HIGH (ref 7.250–7.430)
pO2, Ven: 29 mmHg — CL (ref 32.0–45.0)
pO2, Ven: 29 mmHg — CL (ref 32.0–45.0)

## 2019-10-02 LAB — CBC
HCT: 33.6 % — ABNORMAL LOW (ref 39.0–52.0)
Hemoglobin: 11.9 g/dL — ABNORMAL LOW (ref 13.0–17.0)
MCH: 26.6 pg (ref 26.0–34.0)
MCHC: 35.4 g/dL (ref 30.0–36.0)
MCV: 75 fL — ABNORMAL LOW (ref 80.0–100.0)
Platelets: 149 10*3/uL — ABNORMAL LOW (ref 150–400)
RBC: 4.48 MIL/uL (ref 4.22–5.81)
RDW: 16.2 % — ABNORMAL HIGH (ref 11.5–15.5)
WBC: 10.1 10*3/uL (ref 4.0–10.5)
nRBC: 0 % (ref 0.0–0.2)

## 2019-10-02 LAB — BASIC METABOLIC PANEL
Anion gap: 9 (ref 5–15)
Anion gap: 15 (ref 5–15)
BUN: 81 mg/dL — ABNORMAL HIGH (ref 8–23)
BUN: 92 mg/dL — ABNORMAL HIGH (ref 8–23)
CO2: 29 mmol/L (ref 22–32)
CO2: 28 mmol/L (ref 22–32)
Calcium: 8.8 mg/dL — ABNORMAL LOW (ref 8.9–10.3)
Calcium: 8.8 mg/dL — ABNORMAL LOW (ref 8.9–10.3)
Chloride: 96 mmol/L — ABNORMAL LOW (ref 98–111)
Chloride: 91 mmol/L — ABNORMAL LOW (ref 98–111)
Creatinine, Ser: 2.38 mg/dL — ABNORMAL HIGH (ref 0.61–1.24)
Creatinine, Ser: 2.42 mg/dL — ABNORMAL HIGH (ref 0.61–1.24)
GFR calc Af Amer: 30 mL/min — ABNORMAL LOW (ref >60)
GFR calc Af Amer: 29 mL/min — ABNORMAL LOW (ref >60)
GFR calc non Af Amer: 26 mL/min — ABNORMAL LOW (ref >60)
GFR calc non Af Amer: 25 mL/min — ABNORMAL LOW (ref >60)
Glucose, Bld: 138 mg/dL — ABNORMAL HIGH (ref 70–99)
Glucose, Bld: 292 mg/dL — ABNORMAL HIGH (ref 70–99)
Potassium: 3.6 mmol/L (ref 3.5–5.1)
Potassium: 3.2 mmol/L — ABNORMAL LOW (ref 3.5–5.1)
Sodium: 134 mmol/L — ABNORMAL LOW (ref 135–145)
Sodium: 134 mmol/L — ABNORMAL LOW (ref 135–145)

## 2019-10-02 LAB — HEPARIN LEVEL (UNFRACTIONATED): Heparin Unfractionated: 1.92 IU/mL — ABNORMAL HIGH (ref 0.30–0.70)

## 2019-10-02 LAB — APTT: aPTT: 66 seconds — ABNORMAL HIGH (ref 24–36)

## 2019-10-02 SURGERY — RIGHT HEART CATH

## 2019-10-02 MED ORDER — ACETAMINOPHEN 325 MG PO TABS: 650.0000 mg | ORAL_TABLET | ORAL | Status: DC | PRN

## 2019-10-02 MED ORDER — POTASSIUM CHLORIDE CRYS ER 20 MEQ PO TBCR
40.0000 meq | EXTENDED_RELEASE_TABLET | Freq: Once | ORAL | Status: AC
  Administered 2019-10-02: 40 meq via ORAL
  Filled 2019-10-02: qty 2

## 2019-10-02 MED ORDER — SODIUM CHLORIDE 0.9% FLUSH: 3.0000 mL | INTRAVENOUS | Status: DC | PRN

## 2019-10-02 MED ORDER — SODIUM CHLORIDE 0.9 % IV SOLN: 250.0000 mL | INTRAVENOUS | Status: DC | PRN

## 2019-10-02 MED ORDER — METOLAZONE 5 MG PO TABS
5.0000 mg | ORAL_TABLET | Freq: Once | ORAL | Status: AC
  Administered 2019-10-02: 5 mg via ORAL
  Filled 2019-10-02: qty 1

## 2019-10-02 MED ORDER — METHYLPREDNISOLONE SODIUM SUCC 40 MG IJ SOLR
40.0000 mg | Freq: Every day | INTRAMUSCULAR | Status: DC
  Administered 2019-10-03: 40 mg via INTRAVENOUS
  Filled 2019-10-02: qty 1

## 2019-10-02 MED ORDER — POTASSIUM CHLORIDE CRYS ER 10 MEQ PO TBCR
10.0000 meq | EXTENDED_RELEASE_TABLET | Freq: Once | ORAL | Status: AC
  Administered 2019-10-02: 10 meq via ORAL
  Filled 2019-10-02: qty 1

## 2019-10-02 MED ORDER — HEPARIN (PORCINE) IN NACL 1000-0.9 UT/500ML-% IV SOLN
INTRAVENOUS | Status: DC | PRN
  Administered 2019-10-02: 500 mL

## 2019-10-02 MED ORDER — FUROSEMIDE 10 MG/ML IJ SOLN
160.0000 mg | Freq: Once | INTRAVENOUS | Status: AC
  Administered 2019-10-02: 160 mg via INTRAVENOUS
  Filled 2019-10-02: qty 16

## 2019-10-02 MED ORDER — LIDOCAINE HCL (PF) 1 % IJ SOLN
INTRAMUSCULAR | Status: AC
  Filled 2019-10-02: qty 30

## 2019-10-02 MED ORDER — LABETALOL HCL 5 MG/ML IV SOLN: 10.0000 mg | INTRAVENOUS | Status: AC | PRN

## 2019-10-02 MED ORDER — SODIUM CHLORIDE 0.9% FLUSH
3.0000 mL | Freq: Two times a day (BID) | INTRAVENOUS | Status: DC
  Administered 2019-10-02 – 2019-10-04 (×2): 3 mL via INTRAVENOUS

## 2019-10-02 MED ORDER — HEPARIN (PORCINE) IN NACL 1000-0.9 UT/500ML-% IV SOLN
INTRAVENOUS | Status: AC
  Filled 2019-10-02: qty 1000

## 2019-10-02 MED ORDER — HYDRALAZINE HCL 20 MG/ML IJ SOLN: 10.0000 mg | INTRAMUSCULAR | Status: AC | PRN

## 2019-10-02 MED ORDER — LIDOCAINE HCL (PF) 1 % IJ SOLN
INTRAMUSCULAR | Status: DC | PRN
  Administered 2019-10-02: 2 mL

## 2019-10-02 MED ORDER — APIXABAN 5 MG PO TABS
5.0000 mg | ORAL_TABLET | Freq: Two times a day (BID) | ORAL | Status: DC
  Administered 2019-10-02 – 2019-10-06 (×8): 5 mg via ORAL
  Filled 2019-10-02 (×8): qty 1

## 2019-10-02 MED ORDER — ONDANSETRON HCL 4 MG/2ML IJ SOLN: 4.0000 mg | Freq: Four times a day (QID) | INTRAMUSCULAR | Status: DC | PRN

## 2019-10-02 SURGICAL SUPPLY — 9 items
CATH BALLN WEDGE 5F 110CM (CATHETERS) ×2 IMPLANT
PACK CARDIAC CATHETERIZATION (CUSTOM PROCEDURE TRAY) ×2 IMPLANT
PROTECTION STATION PRESSURIZED (MISCELLANEOUS) ×3
SHEATH GLIDE SLENDER 4/5FR (SHEATH) ×2 IMPLANT
STATION PROTECTION PRESSURIZED (MISCELLANEOUS) IMPLANT
TRANSDUCER W/STOPCOCK (MISCELLANEOUS) ×2 IMPLANT
TUBING ART PRESS 72  MALE/FEM (TUBING) ×2
TUBING ART PRESS 72 MALE/FEM (TUBING) IMPLANT
WIRE EMERALD 3MM-J .025X260CM (WIRE) ×2 IMPLANT

## 2019-10-02 NOTE — CV Procedure (Signed)
   Moderate pulmonary hypertension with mean pressure 42 mmHg  Elevated pulmonary capillary wedge -mean 28 mmHg  Cardiac output 4.24 L/min. With index 2.0 L/min/m  Pulmonary artery pulsatility index (PAPI) is 1.7 consistent with right ventricular failure.  Cardiac power: Unable to calculate without mean arterial pressure  The right heart is very large based upon the course of the right heart catheter.

## 2019-10-02 NOTE — Progress Notes (Signed)
PT Cancellation Note  Patient Details Name: Logan White MRN: [id number] DOB: 11/30/1940   Cancelled Treatment:    Reason Eval/Treat Not Completed: Patient at procedure or test/unavailable(cardiac cath)   York Ram E 10/02/2019, 11:57 AM Arlyce Dice, DPT Acute Rehabilitation Services Office: [phone]

## 2019-10-02 NOTE — Progress Notes (Signed)
Progress Note  Patient Name: Logan White Date of Encounter: 10/02/2019  Primary Cardiologist: Kirk Ruths, MD  Subjective   Reports he feels about the same, which is "not too bad." No CP. Breathing remains improved per his report (but still appears SOB when moving about in bed).  Inpatient Medications    Scheduled Meds: . albuterol  2.5 mg Nebulization BID  . aspirin EC  81 mg Oral Daily  . atorvastatin  80 mg Oral q1800  . hydrALAZINE  10 mg Oral Q8H  . isosorbide dinitrate  20 mg Oral TID  . methylPREDNISolone (SOLU-MEDROL) injection  40 mg Intravenous BID  . metoprolol succinate  100 mg Oral Daily  . mometasone-formoterol  2 puff Inhalation BID  . oxymetazoline  1 spray Each Nare BID  . sodium chloride flush  3 mL Intravenous Q12H  . sodium chloride flush  3 mL Intravenous Q12H   Continuous Infusions: . sodium chloride    . sodium chloride    . sodium chloride 10 mL/hr at 10/02/19 0657  . heparin 1,150 Units/hr (10/02/19 0500)   PRN Meds: sodium chloride, sodium chloride, acetaminophen, albuterol, nitroGLYCERIN, sodium chloride flush, sodium chloride flush   Vital Signs    Vitals:   10/01/19 1256 10/01/19 1941 10/01/19 2119 10/02/19 0502  BP: 109/86  (!) 123/93 101/81  Pulse: 84  (!) 105 (!) 59  Resp: 18  18 18   Temp: 98 F (36.7 C)  98.2 F (36.8 C) (!) 97 F (36.1 C)  TempSrc: Oral  Oral   SpO2: 100% 93%  100%  Weight:      Height:        Intake/Output Summary (Last 24 hours) at 10/02/2019 0809 Last data filed at 10/02/2019 0550 Gross per 24 hour  Intake 820.35 ml  Output 2350 ml  Net -1529.65 ml   Last 3 Weights 10/01/2019 09/29/2019 09/28/2019  Weight (lbs) 182 lb 177 lb 1.6 oz 178 lb 3.2 oz  Weight (kg) 82.555 kg 80.332 kg 80.831 kg  Some encounter information is confidential and restricted. Go to Review Flowsheets activity to see all data.     Telemetry    Atrial fib with frequent PVCs - Personally Reviewed  Physical Exam   GEN:  No acute distress.  HEENT: Normocephalic, atraumatic, sclera non-icteric. Neck: Mildly elevated JVD. No carotid bruits. Cardiac: Irregularly irregular, rate controlled, no murmurs, rubs, or gallops.  Radials/DP/PT 1+ and equal bilaterally.  Respiratory: Diffusely rhonchorous, rare expiratory wheezing on auscultation bilaterally. Breathing is unlabored. GI: Soft, nontender, non-distended, BS +x 4. MS: no deformity. Extremities: No clubbing or cyanosis. Mild taut BLE edema. Distal pedal pulses are in tact and equal bilaterally. Neuro:  AAOx3. Follows commands. Psych:  Responds to questions appropriately with a normal affect.  Labs    High Sensitivity Troponin:  No results for input(s): TROPONINIHS in the last 720 hours.    Cardiac EnzymesNo results for input(s): TROPONINI in the last 168 hours. No results for input(s): TROPIPOC in the last 168 hours.   Chemistry Recent Labs  Lab 09/28/19 1829 09/28/19 2342 09/30/19 0455 10/01/19 0557 10/02/19 0324  NA 133*   < > 133* 132* 134*  K 5.6*   < > 3.9 4.3 3.6  CL 99   < > 98 97* 96*  CO2 23   < > 25 24 29   GLUCOSE 105*   < > 174* 148* 138*  BUN 49*   < > 51* 65* 81*  CREATININE 2.60*   < >  2.48* 2.30* 2.38*  CALCIUM 9.2   < > 8.6* 8.8* 8.8*  PROT 8.0  --   --   --   --   ALBUMIN 3.8  --   --   --   --   AST 32  --   --   --   --   ALT 25  --   --   --   --   ALKPHOS 186*  --   --   --   --   BILITOT 2.9*  --   --   --   --   GFRNONAA 23*   < > 24* 27* 26*  GFRAA 27*   < > 28* 31* 30*  ANIONGAP 11   < > 10 11 9    < > = values in this interval not displayed.     Hematology Recent Labs  Lab 09/29/19 0506 10/01/19 0557 10/02/19 0324  WBC 6.2 8.4 10.1  RBC 5.18 4.65 4.48  HGB 13.7 12.2* 11.9*  HCT 39.3 34.8* 33.6*  MCV 75.9* 74.8* 75.0*  MCH 26.4 26.2 26.6  MCHC 34.9 35.1 35.4  RDW 16.6* 16.2* 16.2*  PLT 147* 117* 149*    BNP Recent Labs  Lab 09/28/19 1829  BNP 2,423.4*     DDimer No results for input(s):  DDIMER in the last 168 hours.   Radiology    US RENAL  Result Date: 09/30/2019 CLINICAL DATA:  Acute on chronic renal failure EXAM: RENAL / URINARY TRACT ULTRASOUND COMPLETE COMPARISON:  11/09/2017 FINDINGS: Right Kidney: Renal measurements: 9.6 x 3.9 x 4.6 cm = volume: 90.6 mL. Cortex is echogenic. No hydronephrosis or mass Left Kidney: Renal measurements: 11.5 x 5.3 x 5.6 cm = volume: 177.5 mL. Cortex is echogenic. No hydronephrosis or mass Bladder: Appears normal for degree of bladder distention. Other: Incidental note made of small amount of ascites and right pleural effusion. Possible contour nodularity of the liver. IMPRESSION: 1. Echogenic kidneys consistent with medical renal disease. No hydronephrosis 2. Small amount of ascites and small right pleural effusion 3. Possible contour nodularity of the liver as may be seen with cirrhosis. Electronically Signed   By: Donavan Foil M.D.   On: 09/30/2019 20:18    Cardiac Studies   2D echo 09/29/19 IMPRESSIONS  1. Left ventricular ejection fraction, by visual estimation, is 25 to 30%. The left ventricle has severely decreased function. There is mildly increased left ventricular hypertrophy.  2. Left ventricular diastolic function could not be evaluated.  3. Severely dilated left ventricular internal cavity size.  4. The left ventricle demonstrates global hypokinesis.  5. Global right ventricle has severely reduced systolic function.The right ventricular size is mildly enlarged.  6. Left atrial size was severely dilated.  7. The interatrial septum is aneurysmal.  8. Right atrial size was severely dilated.  9. The mitral valve is normal in structure. Moderate mitral valve regurgitation. No evidence of mitral stenosis. 10. The tricuspid valve is normal in structure. Tricuspid valve regurgitation is mild. 11. The aortic valve is tricuspid. Aortic valve regurgitation is mild. Mild to moderate aortic valve sclerosis/calcification without any evidence  of aortic stenosis. 12. The pulmonic valve was normal in structure. Pulmonic valve regurgitation is mild. 13. Moderately elevated pulmonary artery systolic pressure. 14. The inferior vena cava is dilated in size with <50% respiratory variability, suggesting right atrial pressure of 15 mmHg. 15. Severe global reduction in LV systolic function; mild LVH; 4 chamber enlargement; severe RV dysfunction; mild AI; moderate MR;  mild TR; moderate pulmonary hypertension.  Patient Profile     79 y.o. male with history of chronic combined CHF 2/2 ischemic CM, coronary artery disease (aborted anterior STEMI s/p PCI to LAD in 2004, NSTEMI 2014 with occluded LCx s/p unsuccessful PCI), prior VF arrest in the setting of hypokalemia 2013, permanent atrial fibrillation, chronic kidney disease stage III, peripheral arterial disease, AAA (Dr. [name] plans f/u US 2022), mitral regurgitation, and COPDwho is beingfollowed by cardiologyfor the evaluation of decompensatedcongestive heart failureand syncope.  Assessment & Plan    1. Acute on chronic combined CHF (severe biventricular failure) --BNP elevated at 2400. Initially given IV lasix, however on cardiology consult 09/29/19, he was felt to be euvolemic and IV lasix held. He was then given trial high-dose Lasix IV 120 mg x 1 09/30/2019 with poor response >> given 160mg  IV Lasix yesterday + 5mg  of metolazone with 2650 UOP. Weights 178->177->182-> not yet weighed today, total I&Os with -1.7L. Plan for RHC today to assess volume/output. Unable to add ACEi/ARB/ARNI due to CKD. Continue metoprolol, hydralazine, isordil - no room to titrate further given his tendency for low-normal BPs.  2. Syncope:Episode occurred while sitting on a stool in his bedroom with associated loss of bowels with unclear etiology. Given syncope, hx of VF, and ischemic cardiomyopathy, discussed with MD whether EP needs to see. Dr. Audie Box feels episode was likely orthostatic in nature and recommends  OP monitor. Discussed no driving x 6 months with patient.  3. CAD:EKG stable with no ischemic changes, no anginal complaints. Continue medical therapy.   4. Permanent atrial fibrillation:Rates controlled, HR>>80s with PVCs. Continue metoprolol. Currently on heparin infusion for anticoagulation in the setting of RHC.  5. Mitral regurgitation:Noted to be moderate on echocardiogram, stable from prior study. Continue monitoring.  6. Hyperkalemia on admission: K 3.6 today following IV Lasix/metolazone. Will give 67meq KCl now and f/u value in AM. If his RHC suggests he needs additional diuresis, would consider additional potassium.  7. COPD:Likely a contributor to SOB on presentation, on IV steroids and nebulizers. Continue management per primary team  8.  Acute on chronic CKD stage III-IV:Creatinine now 2.38 (peak 2.6 this admission). Previous baseline appears to be in the 1.5-1.8 range. Continue to monitor closely with aggressive diuresis. Will need OP nephrology monitoring as well.  For questions or updates, please contact [place] Please consult [email or web address] for contact info under Cardiology/STEMI.  Signed, Charlie Pitter, PA-C 10/02/2019, 8:09 AM

## 2019-10-02 NOTE — Interval H&P Note (Signed)
History and Physical Interval Note:  10/02/2019 12:50 PM  Logan White  has presented today for surgery, with the diagnosis of heart failure.  The various methods of treatment have been discussed with the patient and family. After consideration of risks, benefits and other options for treatment, the patient has consented to  Procedure(s): RIGHT HEART CATH (N/A) as a surgical intervention.  The patient's history has been reviewed, patient examined, no change in status, stable for surgery.  I have reviewed the patient's chart and labs.  Questions were answered to the patient's satisfaction.     [name]

## 2019-10-02 NOTE — Progress Notes (Signed)
Paged Dr Aundra Dubin with [phone] BMP as directed from off going cardiologist.

## 2019-10-02 NOTE — Progress Notes (Signed)
[place] for IV heparin Indication: Afib while off Eliquis  No Known Allergies  Patient Measurements: Height: 6' 3.5" (191.8 cm) Weight: 182 lb (82.6 kg) IBW/kg (Calculated) : 85.65 Heparin Dosing Weight: TBW  Vital Signs: Temp: 97 F (36.1 C) (01/27 0502) BP: 101/81 (01/27 0502) Pulse Rate: 59 (01/27 0502)  Labs: Recent Labs    09/30/19 0455 09/30/19 1754 09/30/19 1754 10/01/19 0557 10/01/19 1621 10/02/19 0324  HGB  --   --   --  12.2*  --  11.9*  HCT  --   --   --  34.8*  --  33.6*  PLT  --   --   --  117*  --  149*  APTT  --  35   < > 53* 94* 66*  LABPROT  --  26.6*  --   --   --   --   INR  --  2.5*  --   --   --   --   HEPARINUNFRC  --  >2.20*  --  >2.20*  --  1.92*  CREATININE 2.48*  --   --  2.30*  --  2.38*   < > = values in this interval not displayed.    Estimated Creatinine Clearance: 30.8 mL/min (A) (by C-G formula based on SCr of 2.38 mg/dL (H)).   Medications:  Medications Prior to Admission  Medication Sig Dispense Refill Last Dose  . acetaminophen (TYLENOL) 325 MG tablet Take 650 mg by mouth every 6 (six) hours as needed for headache.   Past Month at Unknown time  . albuterol (VENTOLIN HFA) 108 (90 Base) MCG/ACT inhaler Inhale 2 puffs into the lungs every 6 (six) hours as needed for wheezing or shortness of breath. 90 g 3 09/28/2019 at Unknown time  . apixaban (ELIQUIS) 5 MG TABS tablet Take 1 tablet (5 mg total) by mouth 2 (two) times daily. 60 tablet 6 09/28/2019 at 0830  . atorvastatin (LIPITOR) 80 MG tablet Take 80 mg by mouth daily.   09/28/2019 at Unknown time  . Ensure (ENSURE) Take 237 mLs by mouth 2 (two) times daily as needed (nutritional supplement).   09/27/2019 at Unknown time  . hydrALAZINE (APRESOLINE) 10 MG tablet Take 1 tablet (10 mg total) by mouth 2 (two) times daily. 180 tablet 0 09/28/2019 at am  . metoprolol succinate (TOPROL-XL) 100 MG 24 hr tablet Take 1 tablet (100 mg total) by mouth daily. 30  tablet 0 09/28/2019 at 0830  . nitroGLYCERIN (NITROSTAT) 0.4 MG SL tablet Place 1 tablet (0.4 mg total) under the tongue every 5 (five) minutes as needed for chest pain (Up to 3 doses). 25 tablet 3 Unknown at unknown  . potassium chloride SA (KLOR-CON) 20 MEQ tablet Take 20 mEq by mouth 2 (two) times daily.   09/28/2019 at am  . torsemide (DEMADEX) 20 MG tablet Take 2 tablets (40 mg total) by mouth 2 (two) times daily. 360 tablet 3 09/28/2019 at am  . isosorbide mononitrate (IMDUR) 30 MG 24 hr tablet Take 1 tablet (30 mg total) by mouth daily. (Patient not taking: Reported on 09/28/2019) 30 tablet 0 Not Taking at Unknown time   Scheduled:  . albuterol  2.5 mg Nebulization BID  . aspirin EC  81 mg Oral Daily  . atorvastatin  80 mg Oral q1800  . hydrALAZINE  10 mg Oral Q8H  . isosorbide dinitrate  20 mg Oral TID  . methylPREDNISolone (SOLU-MEDROL) injection  40 mg Intravenous BID  .  metoprolol succinate  100 mg Oral Daily  . mometasone-formoterol  2 puff Inhalation BID  . oxymetazoline  1 spray Each Nare BID  . potassium chloride  10 mEq Oral Once  . sodium chloride flush  3 mL Intravenous Q12H  . sodium chloride flush  3 mL Intravenous Q12H   Infusions:  . sodium chloride    . sodium chloride    . sodium chloride 10 mL/hr at 10/02/19 0657  . heparin 1,150 Units/hr (10/02/19 0500)    Assessment: 29 yoM with PMH CHF, CAD, permanent AFib on Eliquis, CKD, PAD, admitted for AECHF. May need RH cath this admission, so Pharmacy consulted to dose IV heparin for bridging   Baseline INR and heparin level elevated d/t Eliquis, aPTT WNL   Prior anticoagulation: Eliquis 5 mg daily; last dose 1/25 at 10am  Significant events: 1/25 overnight: RN noted single episode of epistaxis which resolved with humidified air added to [place]  Today, 10/02/2019:  Does not appear to have undergone RH cath yet; rescheduled for today  CBC: Hgb and Plt both slightly low, likely dilutional component  Note acute on  chronic AKI this admission - SCr stable but remains above baseline  Most recent aPTT therapeutic but borderline low on 1150 units/hr; heparin level artificially elevated d/t recent DOAC but trending down appropriately  Mild epistaxis overnight on the 26th, but no further reports of bleeding, and no infusion issues per RN  Goal of Therapy: APTT 66-102 sec Heparin level 0.3-0.7 units/ml Monitor platelets by anticoagulation protocol: Yes  Plan:  Continue heparin infusion at 1150 units/hr  Daily CBC and heparin level; aPTT as needed while DOAC effects on heparin level persist  Monitor closely for signs of new bleeding or thrombosis  F/u anticoag plans s/p RH catheterization  Reuel Boom, PharmD, BCPS [phone] 10/02/2019, 9:31 AM

## 2019-10-02 NOTE — H&P (View-Only) (Signed)
Progress Note  Patient Name: Logan White Date of Encounter: 10/02/2019  Primary Cardiologist: Kirk Ruths, MD  Subjective   Reports he feels about the same, which is "not too bad." No CP. Breathing remains improved per his report (but still appears SOB when moving about in bed).  Inpatient Medications    Scheduled Meds: . albuterol  2.5 mg Nebulization BID  . aspirin EC  81 mg Oral Daily  . atorvastatin  80 mg Oral q1800  . hydrALAZINE  10 mg Oral Q8H  . isosorbide dinitrate  20 mg Oral TID  . methylPREDNISolone (SOLU-MEDROL) injection  40 mg Intravenous BID  . metoprolol succinate  100 mg Oral Daily  . mometasone-formoterol  2 puff Inhalation BID  . oxymetazoline  1 spray Each Nare BID  . sodium chloride flush  3 mL Intravenous Q12H  . sodium chloride flush  3 mL Intravenous Q12H   Continuous Infusions: . sodium chloride    . sodium chloride    . sodium chloride 10 mL/hr at 10/02/19 0657  . heparin 1,150 Units/hr (10/02/19 0500)   PRN Meds: sodium chloride, sodium chloride, acetaminophen, albuterol, nitroGLYCERIN, sodium chloride flush, sodium chloride flush   Vital Signs    Vitals:   10/01/19 1256 10/01/19 1941 10/01/19 2119 10/02/19 0502  BP: 109/86  (!) 123/93 101/81  Pulse: 84  (!) 105 (!) 59  Resp: 18  18 18   Temp: 98 F (36.7 C)  98.2 F (36.8 C) (!) 97 F (36.1 C)  TempSrc: Oral  Oral   SpO2: 100% 93%  100%  Weight:      Height:        Intake/Output Summary (Last 24 hours) at 10/02/2019 0809 Last data filed at 10/02/2019 0550 Gross per 24 hour  Intake 820.35 ml  Output 2350 ml  Net -1529.65 ml   Last 3 Weights 10/01/2019 09/29/2019 09/28/2019  Weight (lbs) 182 lb 177 lb 1.6 oz 178 lb 3.2 oz  Weight (kg) 82.555 kg 80.332 kg 80.831 kg  Some encounter information is confidential and restricted. Go to Review Flowsheets activity to see all data.     Telemetry    Atrial fib with frequent PVCs - Personally Reviewed  Physical Exam   GEN:  No acute distress.  HEENT: Normocephalic, atraumatic, sclera non-icteric. Neck: Mildly elevated JVD. No carotid bruits. Cardiac: Irregularly irregular, rate controlled, no murmurs, rubs, or gallops.  Radials/DP/PT 1+ and equal bilaterally.  Respiratory: Diffusely rhonchorous, rare expiratory wheezing on auscultation bilaterally. Breathing is unlabored. GI: Soft, nontender, non-distended, BS +x 4. MS: no deformity. Extremities: No clubbing or cyanosis. Mild taut BLE edema. Distal pedal pulses are in tact and equal bilaterally. Neuro:  AAOx3. Follows commands. Psych:  Responds to questions appropriately with a normal affect.  Labs    High Sensitivity Troponin:  No results for input(s): TROPONINIHS in the last 720 hours.    Cardiac EnzymesNo results for input(s): TROPONINI in the last 168 hours. No results for input(s): TROPIPOC in the last 168 hours.   Chemistry Recent Labs  Lab 09/28/19 1829 09/28/19 2342 09/30/19 0455 10/01/19 0557 10/02/19 0324  NA 133*   < > 133* 132* 134*  K 5.6*   < > 3.9 4.3 3.6  CL 99   < > 98 97* 96*  CO2 23   < > 25 24 29   GLUCOSE 105*   < > 174* 148* 138*  BUN 49*   < > 51* 65* 81*  CREATININE 2.60*   < >  2.48* 2.30* 2.38*  CALCIUM 9.2   < > 8.6* 8.8* 8.8*  PROT 8.0  --   --   --   --   ALBUMIN 3.8  --   --   --   --   AST 32  --   --   --   --   ALT 25  --   --   --   --   ALKPHOS 186*  --   --   --   --   BILITOT 2.9*  --   --   --   --   GFRNONAA 23*   < > 24* 27* 26*  GFRAA 27*   < > 28* 31* 30*  ANIONGAP 11   < > 10 11 9    < > = values in this interval not displayed.     Hematology Recent Labs  Lab 09/29/19 0506 10/01/19 0557 10/02/19 0324  WBC 6.2 8.4 10.1  RBC 5.18 4.65 4.48  HGB 13.7 12.2* 11.9*  HCT 39.3 34.8* 33.6*  MCV 75.9* 74.8* 75.0*  MCH 26.4 26.2 26.6  MCHC 34.9 35.1 35.4  RDW 16.6* 16.2* 16.2*  PLT 147* 117* 149*    BNP Recent Labs  Lab 09/28/19 1829  BNP 2,423.4*     DDimer No results for input(s):  DDIMER in the last 168 hours.   Radiology    US RENAL  Result Date: 09/30/2019 CLINICAL DATA:  Acute on chronic renal failure EXAM: RENAL / URINARY TRACT ULTRASOUND COMPLETE COMPARISON:  11/09/2017 FINDINGS: Right Kidney: Renal measurements: 9.6 x 3.9 x 4.6 cm = volume: 90.6 mL. Cortex is echogenic. No hydronephrosis or mass Left Kidney: Renal measurements: 11.5 x 5.3 x 5.6 cm = volume: 177.5 mL. Cortex is echogenic. No hydronephrosis or mass Bladder: Appears normal for degree of bladder distention. Other: Incidental note made of small amount of ascites and right pleural effusion. Possible contour nodularity of the liver. IMPRESSION: 1. Echogenic kidneys consistent with medical renal disease. No hydronephrosis 2. Small amount of ascites and small right pleural effusion 3. Possible contour nodularity of the liver as may be seen with cirrhosis. Electronically Signed   By: Donavan Foil M.D.   On: 09/30/2019 20:18    Cardiac Studies   2D echo 09/29/19 IMPRESSIONS  1. Left ventricular ejection fraction, by visual estimation, is 25 to 30%. The left ventricle has severely decreased function. There is mildly increased left ventricular hypertrophy.  2. Left ventricular diastolic function could not be evaluated.  3. Severely dilated left ventricular internal cavity size.  4. The left ventricle demonstrates global hypokinesis.  5. Global right ventricle has severely reduced systolic function.The right ventricular size is mildly enlarged.  6. Left atrial size was severely dilated.  7. The interatrial septum is aneurysmal.  8. Right atrial size was severely dilated.  9. The mitral valve is normal in structure. Moderate mitral valve regurgitation. No evidence of mitral stenosis. 10. The tricuspid valve is normal in structure. Tricuspid valve regurgitation is mild. 11. The aortic valve is tricuspid. Aortic valve regurgitation is mild. Mild to moderate aortic valve sclerosis/calcification without any evidence  of aortic stenosis. 12. The pulmonic valve was normal in structure. Pulmonic valve regurgitation is mild. 13. Moderately elevated pulmonary artery systolic pressure. 14. The inferior vena cava is dilated in size with <50% respiratory variability, suggesting right atrial pressure of 15 mmHg. 15. Severe global reduction in LV systolic function; mild LVH; 4 chamber enlargement; severe RV dysfunction; mild AI; moderate MR;  mild TR; moderate pulmonary hypertension.  Patient Profile     79 y.o. male with history of chronic combined CHF 2/2 ischemic CM, coronary artery disease (aborted anterior STEMI s/p PCI to LAD in 2004, NSTEMI 2014 with occluded LCx s/p unsuccessful PCI), prior VF arrest in the setting of hypokalemia 2013, permanent atrial fibrillation, chronic kidney disease stage III, peripheral arterial disease, AAA (Dr. [name] plans f/u US 2022), mitral regurgitation, and COPDwho is beingfollowed by cardiologyfor the evaluation of decompensatedcongestive heart failureand syncope.  Assessment & Plan    1. Acute on chronic combined CHF (severe biventricular failure) --BNP elevated at 2400. Initially given IV lasix, however on cardiology consult 09/29/19, he was felt to be euvolemic and IV lasix held. He was then given trial high-dose Lasix IV 120 mg x 1 09/30/2019 with poor response >> given 160mg  IV Lasix yesterday + 5mg  of metolazone with 2650 UOP. Weights 178->177->182-> not yet weighed today, total I&Os with -1.7L. Plan for RHC today to assess volume/output. Unable to add ACEi/ARB/ARNI due to CKD. Continue metoprolol, hydralazine, isordil - no room to titrate further given his tendency for low-normal BPs.  2. Syncope:Episode occurred while sitting on a stool in his bedroom with associated loss of bowels with unclear etiology. Given syncope, hx of VF, and ischemic cardiomyopathy, discussed with MD whether EP needs to see. Dr. Audie Box feels episode was likely orthostatic in nature and recommends  OP monitor. Discussed no driving x 6 months with patient.  3. CAD:EKG stable with no ischemic changes, no anginal complaints. Continue medical therapy.   4. Permanent atrial fibrillation:Rates controlled, HR>>80s with PVCs. Continue metoprolol. Currently on heparin infusion for anticoagulation in the setting of RHC.  5. Mitral regurgitation:Noted to be moderate on echocardiogram, stable from prior study. Continue monitoring.  6. Hyperkalemia on admission: K 3.6 today following IV Lasix/metolazone. Will give 50meq KCl now and f/u value in AM. If his RHC suggests he needs additional diuresis, would consider additional potassium.  7. COPD:Likely a contributor to SOB on presentation, on IV steroids and nebulizers. Continue management per primary team  8.  Acute on chronic CKD stage III-IV:Creatinine now 2.38 (peak 2.6 this admission). Previous baseline appears to be in the 1.5-1.8 range. Continue to monitor closely with aggressive diuresis. Will need OP nephrology monitoring as well.  For questions or updates, please contact [place] Please consult [email or web address] for contact info under Cardiology/STEMI.  Signed, Charlie Pitter, PA-C 10/02/2019, 8:09 AM

## 2019-10-02 NOTE — Progress Notes (Signed)
Pharmacy: Eliquis Pt s/p right heart cath.  To resume PTA Eliquis tonight. SCr 2.38. Age < 49, Wt > 60 kg.  Plan: resume home dose of Eliquis 5 mg po bid tonight at 2200. Pharmacy to sign off  Eudelia Bunch, Pharm.D 10/02/2019 5:19 PM

## 2019-10-02 NOTE — Progress Notes (Addendum)
   Dr. Audie Box reviewed [place] report and advises 5mg  oral metolazone followed by 160mg  IV Lasix 30 minutes later and administration of 87meq KCl. Orders placed. Given h/o prior issues with both hypokalemia and hyperkalemia, we will also get an 8pm BMET with care instructions written to page on-call cardiology with result (will sign out to call team this PM).  Addendum: also received request from pharmacy to clarify plan for heparin - relayed plan per Dr. Audie Box to d/c heparin and start Eliquis back tonight.  Katheryn Culliton PA-C

## 2019-10-02 NOTE — Progress Notes (Signed)
PROGRESS NOTE    Logan White  WCH:[id number] DOB: 1941/04/23 DOA: 09/28/2019 PCP: No primary care provider on file.    Brief Narrative:  79 year old gentleman with extensive cardiac history including chronic systolic congestive heart failure with known ejection fraction of 30%, chronic A. fib on anticoagulation with Eliquis, previous V. fib arrest in 2014, ischemic cardiomyopathy, coronary artery disease, peripheral vascular disease and underlying COPD.  He is managed outpatient, recently increased dose of torsemide, despite that continued to have significant exertional dyspnea and orthopnea.  On the day of admission, he had an episode of severe shortness of breath on walking to the bathroom and near syncopal episode so EMS was called and he was brought to ER. In the emergency room 87% on room air.  Admitted for treatment with heart failure.  Assessment & Plan:   Active Problems:   CHF (congestive heart failure), NYHA class IV, acute, systolic (HCC)  Acute hypoxic respiratory failure: multifactorial.  likely COPD exacerbation and combined acute on chronic congestive heart failure: Some clinical improvement today.  Echocardiogram with ejection fraction 25%.   On intermittent Lasix, urine output improving.   Followed by cardiology.  Going for right heart cath today.   Keep on oxygen to keep saturations more than 89%.  Mobilize.  Hyperkalemia on presentation: Patient is adequate today.  We will replace along with Lasix.  Acute on chronic kidney disease stage III: historical creatinine 1.3-1.4. Worsening serum creatinine probably due to acute congestive heart failure.  Renal ultrasound with no obstruction or hydronephrosis.  Medical renal disease. Creatinine is 2.38 today. Urine output measurement is not accurate, will do strict output measuring.  Chronic A. fib: Rate controlled.  Patient was on Eliquis, changed to heparin in anticipation for cardiac cath.  Hypertension: Blood  pressures fairly stable.  Unable to tolerate additional medications.  Chronic bronchitis with acute exacerbation: Started on aggressive bronchodilator therapy, chest physiotherapy.  On IV steroids, will taper off.  DVT prophylaxis: Heparin infusion. Code Status: Full code Family Communication: Patient talking with family. Disposition Plan: patient is from home.. Anticipated DC to home, Barriers to discharge active treatment , still on treatment with significant symptoms.  For cardiac cath today.  Consultants:   Cardiology  Procedures:   None  Antimicrobials:   None   Subjective: Patient seen and examined.  No overnight events.  He overall thinks his breathing is better.  Less wheezing today. Objective: Vitals:   10/01/19 2119 10/02/19 0502 10/02/19 0927 10/02/19 1005  BP: (!) 123/93 101/81    Pulse: (!) 105 (!) 59  88  Resp: 18 18    Temp: 98.2 F (36.8 C) (!) 97 F (36.1 C)    TempSrc: Oral     SpO2:  100% 95%   Weight:      Height:        Intake/Output Summary (Last 24 hours) at 10/02/2019 1107 Last data filed at 10/02/2019 0915 Gross per 24 hour  Intake 820.35 ml  Output 3025 ml  Net -2204.65 ml   Filed Weights   09/28/19 1731 09/29/19 2200 10/01/19 0402  Weight: 80.8 kg 80.3 kg 82.6 kg    Examination:  General exam: Appears calm and comfortable, on 2 L of oxygen. Respiratory system: No wheezing today.  Occasional basal crackles.   Cardiovascular system: S1 & S2 heard, irregularly irregular.  Shiny chronic ischemic bilateral legs.  Patient has peripheral vascular disease.  Mostly chronic nonpedal edema. Gastrointestinal system: Abdomen is nondistended, soft and nontender.  Central nervous  system: Alert and oriented. No focal neurological deficits. Extremities: Symmetric 5 x 5 power. Skin: No rashes, lesions or ulcers Psychiatry: Judgement and insight appear normal. Mood & affect appropriate.     Data Reviewed: I have personally reviewed following labs  and imaging studies  CBC: Recent Labs  Lab 09/28/19 1829 09/29/19 0506 10/01/19 0557 10/02/19 0324  WBC 5.8 6.2 8.4 10.1  NEUTROABS 2.8  --   --   --   HGB 15.3 13.7 12.2* 11.9*  HCT 43.7 39.3 34.8* 33.6*  MCV 76.1* 75.9* 74.8* 75.0*  PLT 185 147* 117* 270*   Basic Metabolic Panel: Recent Labs  Lab 09/28/19 1829 09/28/19 1829 09/28/19 2342 09/29/19 0506 09/30/19 0455 10/01/19 0557 10/02/19 0324  NA 133*  --   --  135 133* 132* 134*  K 5.6*   < > 5.5* 4.6 3.9 4.3 3.6  CL 99  --   --  101 98 97* 96*  CO2 23  --   --  23 25 24 29   GLUCOSE 105*  --   --  119* 174* 148* 138*  BUN 49*  --   --  51* 51* 65* 81*  CREATININE 2.60*  --   --  2.50* 2.48* 2.30* 2.38*  CALCIUM 9.2  --   --  8.7* 8.6* 8.8* 8.8*  MG  --   --   --   --  2.3  --   --   PHOS  --   --   --   --  5.4*  --   --    < > = values in this interval not displayed.   GFR: Estimated Creatinine Clearance: 30.8 mL/min (A) (by C-G formula based on SCr of 2.38 mg/dL (H)). Liver Function Tests: Recent Labs  Lab 09/28/19 1829  AST 32  ALT 25  ALKPHOS 186*  BILITOT 2.9*  PROT 8.0  ALBUMIN 3.8   No results for input(s): LIPASE, AMYLASE in the last 168 hours. No results for input(s): AMMONIA in the last 168 hours. Coagulation Profile: Recent Labs  Lab 09/30/19 1754  INR 2.5*   Cardiac Enzymes: No results for input(s): CKTOTAL, CKMB, CKMBINDEX, TROPONINI in the last 168 hours. BNP (last 3 results) Recent Labs    08/15/19 0949  PROBNP 16,511*   HbA1C: No results for input(s): HGBA1C in the last 72 hours. CBG: No results for input(s): GLUCAP in the last 168 hours. Lipid Profile: No results for input(s): CHOL, HDL, LDLCALC, TRIG, CHOLHDL, LDLDIRECT in the last 72 hours. Thyroid Function Tests: No results for input(s): TSH, T4TOTAL, FREET4, T3FREE, THYROIDAB in the last 72 hours. Anemia Panel: No results for input(s): VITAMINB12, FOLATE, FERRITIN, TIBC, IRON, RETICCTPCT in the last 72 hours. Sepsis  Labs: No results for input(s): PROCALCITON, LATICACIDVEN in the last 168 hours.  Recent Results (from the past 240 hour(s))  Respiratory Panel by RT PCR (Flu A&B, Covid) - Nasopharyngeal Swab     Status: None   Collection Time: 09/28/19  6:29 PM   Specimen: Nasopharyngeal Swab  Result Value Ref Range Status   SARS Coronavirus 2 by RT PCR NEGATIVE NEGATIVE Final    Comment: (NOTE) SARS-CoV-2 target nucleic acids are NOT DETECTED. The SARS-CoV-2 RNA is generally detectable in upper respiratoy specimens during the acute phase of infection. The lowest concentration of SARS-CoV-2 viral copies this assay can detect is 131 copies/mL. A negative result does not preclude SARS-Cov-2 infection and should not be used as the sole basis for treatment or other  patient management decisions. A negative result may occur with  improper specimen collection/handling, submission of specimen other than nasopharyngeal swab, presence of viral mutation(s) within the areas targeted by this assay, and inadequate number of viral copies (<131 copies/mL). A negative result must be combined with clinical observations, patient history, and epidemiological information. The expected result is Negative. Fact Sheet for Patients:  PinkCheek.be Fact Sheet for Healthcare Providers:  GravelBags.it This test is not yet ap proved or cleared by the [place] FDA and  has been authorized for detection and/or diagnosis of SARS-CoV-2 by FDA under an Emergency Use Authorization (EUA). This EUA will remain  in effect (meaning this test can be used) for the duration of the COVID-19 declaration under Section 564(b)(1) of the Act, 21 U.S.C. section 360bbb-3(b)(1), unless the authorization is terminated or revoked sooner.    Influenza A by PCR NEGATIVE NEGATIVE Final   Influenza B by PCR NEGATIVE NEGATIVE Final    Comment: (NOTE) The Xpert Xpress SARS-CoV-2/FLU/RSV assay  is intended as an aid in  the diagnosis of influenza from Nasopharyngeal swab specimens and  should not be used as a sole basis for treatment. Nasal washings and  aspirates are unacceptable for Xpert Xpress SARS-CoV-2/FLU/RSV  testing. Fact Sheet for Patients: PinkCheek.be Fact Sheet for Healthcare Providers: GravelBags.it This test is not yet approved or cleared by the [place] FDA and  has been authorized for detection and/or diagnosis of SARS-CoV-2 by  FDA under an Emergency Use Authorization (EUA). This EUA will remain  in effect (meaning this test can be used) for the duration of the  Covid-19 declaration under Section 564(b)(1) of the Act, 21  U.S.C. section 360bbb-3(b)(1), unless the authorization is  terminated or revoked. Performed at [facility], [place] [address], [place], [place] [address]          Radiology Studies: US RENAL  Result Date: 09/30/2019 CLINICAL DATA:  Acute on chronic renal failure EXAM: RENAL / URINARY TRACT ULTRASOUND COMPLETE COMPARISON:  11/09/2017 FINDINGS: Right Kidney: Renal measurements: 9.6 x 3.9 x 4.6 cm = volume: 90.6 mL. Cortex is echogenic. No hydronephrosis or mass Left Kidney: Renal measurements: 11.5 x 5.3 x 5.6 cm = volume: 177.5 mL. Cortex is echogenic. No hydronephrosis or mass Bladder: Appears normal for degree of bladder distention. Other: Incidental note made of small amount of ascites and right pleural effusion. Possible contour nodularity of the liver. IMPRESSION: 1. Echogenic kidneys consistent with medical renal disease. No hydronephrosis 2. Small amount of ascites and small right pleural effusion 3. Possible contour nodularity of the liver as may be seen with cirrhosis. Electronically Signed   By: Donavan Foil M.D.   On: 09/30/2019 20:18        Scheduled Meds: . albuterol  2.5 mg Nebulization BID  . aspirin EC  81 mg Oral Daily  . atorvastatin  80  mg Oral q1800  . hydrALAZINE  10 mg Oral Q8H  . isosorbide dinitrate  20 mg Oral TID  . [START ON 10/03/2019] methylPREDNISolone (SOLU-MEDROL) injection  40 mg Intravenous Daily  . metoprolol succinate  100 mg Oral Daily  . mometasone-formoterol  2 puff Inhalation BID  . oxymetazoline  1 spray Each Nare BID  . sodium chloride flush  3 mL Intravenous Q12H  . sodium chloride flush  3 mL Intravenous Q12H   Continuous Infusions: . sodium chloride    . sodium chloride    . sodium chloride 10 mL/hr at 10/02/19 0657  . heparin 1,150  Units/hr (10/02/19 0500)     LOS: 4 days    Time spent: 25  minutes    Barb Merino, MD [facility] Pager [phone]

## 2019-10-03 LAB — MAGNESIUM: Magnesium: 2.3 mg/dL (ref 1.7–2.4)

## 2019-10-03 LAB — BASIC METABOLIC PANEL
Anion gap: 12 (ref 5–15)
Anion gap: 11 (ref 5–15)
BUN: 96 mg/dL — ABNORMAL HIGH (ref 8–23)
BUN: 96 mg/dL — ABNORMAL HIGH (ref 8–23)
CO2: 30 mmol/L (ref 22–32)
CO2: 30 mmol/L (ref 22–32)
Calcium: 9.2 mg/dL (ref 8.9–10.3)
Calcium: 8.9 mg/dL (ref 8.9–10.3)
Chloride: 92 mmol/L — ABNORMAL LOW (ref 98–111)
Chloride: 94 mmol/L — ABNORMAL LOW (ref 98–111)
Creatinine, Ser: 2.26 mg/dL — ABNORMAL HIGH (ref 0.61–1.24)
Creatinine, Ser: 2.04 mg/dL — ABNORMAL HIGH (ref 0.61–1.24)
GFR calc Af Amer: 31 mL/min — ABNORMAL LOW (ref >60)
GFR calc Af Amer: 36 mL/min — ABNORMAL LOW (ref >60)
GFR calc non Af Amer: 27 mL/min — ABNORMAL LOW (ref >60)
GFR calc non Af Amer: 31 mL/min — ABNORMAL LOW (ref >60)
Glucose, Bld: 177 mg/dL — ABNORMAL HIGH (ref 70–99)
Glucose, Bld: 158 mg/dL — ABNORMAL HIGH (ref 70–99)
Potassium: 3.1 mmol/L — ABNORMAL LOW (ref 3.5–5.1)
Potassium: 3.5 mmol/L (ref 3.5–5.1)
Sodium: 134 mmol/L — ABNORMAL LOW (ref 135–145)
Sodium: 135 mmol/L (ref 135–145)

## 2019-10-03 MED ORDER — POTASSIUM CHLORIDE CRYS ER 20 MEQ PO TBCR: 60.0000 meq | EXTENDED_RELEASE_TABLET | Freq: Once | ORAL | Status: DC

## 2019-10-03 MED ORDER — POTASSIUM CHLORIDE CRYS ER 20 MEQ PO TBCR
40.0000 meq | EXTENDED_RELEASE_TABLET | Freq: Once | ORAL | Status: AC
  Administered 2019-10-03: 40 meq via ORAL
  Filled 2019-10-03: qty 2

## 2019-10-03 MED ORDER — FUROSEMIDE 10 MG/ML IJ SOLN
100.0000 mg | Freq: Once | INTRAVENOUS | Status: AC
  Administered 2019-10-03: 100 mg via INTRAVENOUS
  Filled 2019-10-03: qty 10

## 2019-10-03 MED ORDER — POTASSIUM CHLORIDE CRYS ER 20 MEQ PO TBCR
40.0000 meq | EXTENDED_RELEASE_TABLET | Freq: Two times a day (BID) | ORAL | Status: DC
  Administered 2019-10-03 (×2): 40 meq via ORAL
  Filled 2019-10-03 (×2): qty 2

## 2019-10-03 NOTE — Progress Notes (Signed)
SATURATION QUALIFICATIONS: (This note is used to comply with regulatory documentation for home oxygen)  Patient Saturations on Room Air at Rest = 85%  Patient Saturations on Room Air while Ambulating = 85%  Patient Saturations on 2 Liters of oxygen while Ambulating = 99%  Please briefly explain why patient needs home oxygen:Pt not able to maintain 02 sat above 90 while at rest or while ambulating.

## 2019-10-03 NOTE — Progress Notes (Signed)
Physical Therapy Treatment Patient Details Name: Logan White MRN: [id number] DOB: 02-12-1941 Today's Date: 10/03/2019    History of Present Illness 78 yo male admitted with CHF, syncope. Hx of DM, DVA, PAF, AAA, bradycardia, CKD    PT Comments    Pt assisted with ambulating and monitored oxygen.  Pt requiring frequent rest breaks for breathing.  RN notified of saturations.     Follow Up Recommendations  No PT follow up     Equipment Recommendations  None recommended by PT    Recommendations for Other Services       Precautions / Restrictions Precautions Precautions: Fall Precaution Comments: monitor sats Restrictions Weight Bearing Restrictions: No    Mobility  Bed Mobility Overal bed mobility: Modified Independent             General bed mobility comments: rest breaks needed while sitting EOB and donning socks; SPO2 85% on room air at rest  Transfers Overall transfer level: Needs assistance Equipment used: None Transfers: Sit to/from Stand Sit to Stand: Supervision         General transfer comment: for safety  Ambulation/Gait Ambulation/Gait assistance: Min guard Gait Distance (Feet): 200 Feet Assistive device: Straight cane Gait Pattern/deviations: Step-through pattern;Narrow base of support;Scissoring     General Gait Details: min/guard for safety, pt unsteady however self corrects - likely due to scissoring, SPO2 99% on 2L O2 [place]   Stairs             Wheelchair Mobility    Modified Rankin (Stroke Patients Only)       Balance Overall balance assessment: Mild deficits observed, not formally tested                                          Cognition Arousal/Alertness: Awake/alert Behavior During Therapy: Flat affect Overall Cognitive Status: Within Functional Limits for tasks assessed                                        Exercises      General Comments        Pertinent Vitals/Pain  Pain Assessment: No/denies pain    Home Living                      Prior Function            PT Goals (current goals can now be found in the care plan section) Progress towards PT goals: Progressing toward goals    Frequency    Min 3X/week      PT Plan Current plan remains appropriate    Co-evaluation              AM-PAC PT "6 Clicks" Mobility   Outcome Measure  Help needed turning from your back to your side while in a flat bed without using bedrails?: None Help needed moving from lying on your back to sitting on the side of a flat bed without using bedrails?: None Help needed moving to and from a bed to a chair (including a wheelchair)?: A Little Help needed standing up from a chair using your arms (e.g., wheelchair or bedside chair)?: A Little Help needed to walk in hospital room?: A Little Help needed climbing 3-5 steps with a railing? : A Little 6 Click  Score: 20    End of Session Equipment Utilized During Treatment: Gait belt;Oxygen Activity Tolerance: Patient tolerated treatment well Patient left: in chair;with call bell/phone within reach;with chair alarm set Nurse Communication: Mobility status PT Visit Diagnosis: Unsteadiness on feet (R26.81)     Time: 7703-4035 PT Time Calculation (min) (ACUTE ONLY): 18 min  Charges:  $Gait Training: 8-22 mins                    Arlyce Dice, DPT Acute Rehabilitation Services Office: [phone]  York Ram E 10/03/2019, 1:05 PM

## 2019-10-03 NOTE — Progress Notes (Signed)
Pt IV found to be disconnected from IV infusion of IVPB lasix. Not exactly sure how much the patient received before becoming disconnected. IV tubing cleaned and reconnected. 74ml of infusion remain in the IVPB bag to be infused. MD Ghimire notifed.

## 2019-10-03 NOTE — Progress Notes (Signed)
PROGRESS NOTE    Logan White  WKM:[id number] DOB: [birth year] DOA: 09/28/2019 PCP: No primary care provider on file.    Brief Narrative:  79 year old gentleman with extensive cardiac history including chronic systolic congestive heart failure with known ejection fraction of 30%, chronic A. fib on anticoagulation with Eliquis, previous V. fib arrest in 2014, ischemic cardiomyopathy, coronary artery disease, peripheral vascular disease and underlying COPD.  He is managed outpatient, recently increased dose of torsemide, despite that continued to have significant exertional dyspnea and orthopnea.  On the day of admission, he had an episode of severe shortness of breath on walking to the bathroom and near syncopal episode so EMS was called and he was brought to ER. In the emergency room 87% on room air.  Admitted for treatment with heart failure.  Assessment & Plan:   Principal Problem:   CHF (congestive heart failure), NYHA class IV, acute, systolic (HCC) Active Problems:   VF (ventricular fibrillation) (HCC)  Acute hypoxic respiratory failure:  acute on chronic systolic congestive heart failure: Some clinical improvement now with intermittent high-dose diuretics.  Echocardiogram with ejection fraction 25%.  On intermittent Lasix, urine output improving.   Followed by cardiology.  Status post right heart cath. Keep on oxygen to keep saturations more than 89%.  Mobilize. May need home oxygen.  Hyperkalemia on presentation: Hypokalemic now.  Replaced further.  Acute on chronic kidney disease stage III: historical creatinine 1.3-1.4. Worsening serum creatinine probably due to acute congestive heart failure.  Renal ultrasound with no obstruction or hydronephrosis.  Medical renal disease. Creatinine is stabilizing. Urine output measurement is not accurate, will do strict output measuring.  Chronic A. fib: Rate controlled.  Back on Eliquis now.  Hypertension: Blood pressures fairly  stable.  Unable to tolerate additional medications.  Chronic bronchitis with acute exacerbation: Started on aggressive bronchodilator therapy, chest physiotherapy.  On IV steroids, will taper off. Will prescribe steroid inhaler and nebulizer on discharge.  DVT prophylaxis: Heparin infusion. Code Status: Full code Family Communication: Patient talking with family. Disposition Plan: patient is from home.. Anticipated DC to home, Barriers to discharge active treatment , still on treatment with significant symptoms.    Consultants:   Cardiology  Procedures:   None  Antimicrobials:   None   Subjective: Patient seen and examined.  No overnight events.  He was wondering when he is going home.  Breathing better. Objective: Vitals:   10/03/19 0804 10/03/19 0811 10/03/19 0836 10/03/19 0912  BP:    103/83  Pulse:    65  Resp:      Temp:      TempSrc:      SpO2: (!) 89% 91%    Weight:   75.4 kg   Height:        Intake/Output Summary (Last 24 hours) at 10/03/2019 1123 Last data filed at 10/03/2019 0928 Gross per 24 hour  Intake 1210 ml  Output 3800 ml  Net -2590 ml   Filed Weights   09/29/19 2200 10/01/19 0402 10/03/19 0836  Weight: 80.3 kg 82.6 kg 75.4 kg    Examination:  General exam: Appears calm and comfortable, on 1-2 L of oxygen. Respiratory system: No wheezing today.  Still has basal crackles.   Cardiovascular system: S1 & S2 heard, irregularly irregular.  Shiny chronic ischemic bilateral legs.  Patient has peripheral vascular disease.  Mostly chronic nonpedal edema. Gastrointestinal system: Abdomen is nondistended, soft and nontender.  Central nervous system: Alert and oriented. No focal neurological deficits. Extremities: Symmetric  5 x 5 power. Skin: No rashes, lesions or ulcers Psychiatry: Judgement and insight appear normal. Mood & affect appropriate.     Data Reviewed: I have personally reviewed following labs and imaging studies  CBC: Recent Labs  Lab  09/28/19 1829 09/29/19 0506 10/01/19 0557 10/02/19 0324 10/02/19 1323  WBC 5.8 6.2 8.4 10.1  --   NEUTROABS 2.8  --   --   --   --   HGB 15.3 13.7 12.2* 11.9* 13.3  12.9*  HCT 43.7 39.3 34.8* 33.6* 39.0  38.0*  MCV 76.1* 75.9* 74.8* 75.0*  --   PLT 185 147* 117* 149*  --    Basic Metabolic Panel: Recent Labs  Lab 09/30/19 0455 09/30/19 0455 10/01/19 0557 10/02/19 0324 10/02/19 1323 10/02/19 2106 10/03/19 0427  NA 133*   < > 132* 134* 137  136 134* 134*  K 3.9   < > 4.3 3.6 3.2*  3.2* 3.2* 3.1*  CL 98  --  97* 96*  --  91* 92*  CO2 25  --  24 29  --  28 30  GLUCOSE 174*  --  148* 138*  --  292* 177*  BUN 51*  --  65* 81*  --  92* 96*  CREATININE 2.48*  --  2.30* 2.38*  --  2.42* 2.26*  CALCIUM 8.6*  --  8.8* 8.8*  --  8.8* 9.2  MG 2.3  --   --   --   --   --  2.3  PHOS 5.4*  --   --   --   --   --   --    < > = values in this interval not displayed.   GFR: Estimated Creatinine Clearance: 29.7 mL/min (A) (by C-G formula based on SCr of 2.26 mg/dL (H)). Liver Function Tests: Recent Labs  Lab 09/28/19 1829  AST 32  ALT 25  ALKPHOS 186*  BILITOT 2.9*  PROT 8.0  ALBUMIN 3.8   No results for input(s): LIPASE, AMYLASE in the last 168 hours. No results for input(s): AMMONIA in the last 168 hours. Coagulation Profile: Recent Labs  Lab 09/30/19 1754  INR 2.5*   Cardiac Enzymes: No results for input(s): CKTOTAL, CKMB, CKMBINDEX, TROPONINI in the last 168 hours. BNP (last 3 results) Recent Labs    08/15/19 0949  PROBNP 16,511*   HbA1C: No results for input(s): HGBA1C in the last 72 hours. CBG: Recent Labs  Lab 10/02/19 1351  GLUCAP 128*   Lipid Profile: No results for input(s): CHOL, HDL, LDLCALC, TRIG, CHOLHDL, LDLDIRECT in the last 72 hours. Thyroid Function Tests: No results for input(s): TSH, T4TOTAL, FREET4, T3FREE, THYROIDAB in the last 72 hours. Anemia Panel: No results for input(s): VITAMINB12, FOLATE, FERRITIN, TIBC, IRON, RETICCTPCT in the  last 72 hours. Sepsis Labs: No results for input(s): PROCALCITON, LATICACIDVEN in the last 168 hours.  Recent Results (from the past 240 hour(s))  Respiratory Panel by RT PCR (Flu A&B, Covid) - Nasopharyngeal Swab     Status: None   Collection Time: 09/28/19  6:29 PM   Specimen: Nasopharyngeal Swab  Result Value Ref Range Status   SARS Coronavirus 2 by RT PCR NEGATIVE NEGATIVE Final    Comment: (NOTE) SARS-CoV-2 target nucleic acids are NOT DETECTED. The SARS-CoV-2 RNA is generally detectable in upper respiratoy specimens during the acute phase of infection. The lowest concentration of SARS-CoV-2 viral copies this assay can detect is 131 copies/mL. A negative result does not preclude SARS-Cov-2 infection and  should not be used as the sole basis for treatment or other patient management decisions. A negative result may occur with  improper specimen collection/handling, submission of specimen other than nasopharyngeal swab, presence of viral mutation(s) within the areas targeted by this assay, and inadequate number of viral copies (<131 copies/mL). A negative result must be combined with clinical observations, patient history, and epidemiological information. The expected result is Negative. Fact Sheet for Patients:  PinkCheek.be Fact Sheet for Healthcare Providers:  GravelBags.it This test is not yet ap proved or cleared by the [place] FDA and  has been authorized for detection and/or diagnosis of SARS-CoV-2 by FDA under an Emergency Use Authorization (EUA). This EUA will remain  in effect (meaning this test can be used) for the duration of the COVID-19 declaration under Section 564(b)(1) of the Act, 21 U.S.C. section 360bbb-3(b)(1), unless the authorization is terminated or revoked sooner.    Influenza A by PCR NEGATIVE NEGATIVE Final   Influenza B by PCR NEGATIVE NEGATIVE Final    Comment: (NOTE) The Xpert Xpress  SARS-CoV-2/FLU/RSV assay is intended as an aid in  the diagnosis of influenza from Nasopharyngeal swab specimens and  should not be used as a sole basis for treatment. Nasal washings and  aspirates are unacceptable for Xpert Xpress SARS-CoV-2/FLU/RSV  testing. Fact Sheet for Patients: PinkCheek.be Fact Sheet for Healthcare Providers: GravelBags.it This test is not yet approved or cleared by the [place] FDA and  has been authorized for detection and/or diagnosis of SARS-CoV-2 by  FDA under an Emergency Use Authorization (EUA). This EUA will remain  in effect (meaning this test can be used) for the duration of the  Covid-19 declaration under Section 564(b)(1) of the Act, 21  U.S.C. section 360bbb-3(b)(1), unless the authorization is  terminated or revoked. Performed at [facility], [place] [place], [place] [address]          Radiology Studies: CARDIAC CATHETERIZATION  Result Date: 10/02/2019  Moderate pulmonary hypertension with mean PA pressure of 42 mmHg.  Mean pulmonary capillary wedge pressure 28 mmHg.  Phasic pulmonary artery pressure 63/29 mmHg, mean RA pressure 20 mmHg, and pulmonary artery pulsatility index is 1.7 (suggesting RV dysfunction)  Mixed venous O2 saturation 57%       Scheduled Meds: . albuterol  2.5 mg Nebulization BID  . apixaban  5 mg Oral BID  . aspirin EC  81 mg Oral Daily  . atorvastatin  80 mg Oral q1800  . hydrALAZINE  10 mg Oral Q8H  . isosorbide dinitrate  20 mg Oral TID  . methylPREDNISolone (SOLU-MEDROL) injection  40 mg Intravenous Daily  . metoprolol succinate  100 mg Oral Daily  . mometasone-formoterol  2 puff Inhalation BID  . oxymetazoline  1 spray Each Nare BID  . potassium chloride  40 mEq Oral BID  . sodium chloride flush  3 mL Intravenous Q12H  . sodium chloride flush  3 mL Intravenous Q12H  . sodium chloride flush  3 mL Intravenous Q12H    Continuous Infusions: . sodium chloride    . sodium chloride       LOS: 5 days    Time spent: 25  minutes    Barb Merino, MD [facility] Pager [phone]

## 2019-10-03 NOTE — Consult Note (Signed)
   [facility] Inpatient Consult   10/03/2019  Logan White 06/24/1941 [id number]    Patienthas been reviewed for 23% high risk score for unplanned readmission and hospitalization, under his [facility] Medicare benefit. Patient assessed for [place] [facility]) Care Management needs for services.  Chart review reveals MD brief narrative as: 79 year old gentleman with extensive cardiac history including chronic systolic congestive heart failure with known ejection fraction of 30%, chronic A. fib on anticoagulation with Eliquis, previous V. fib arrest in 2014, ischemic cardiomyopathy, coronary artery disease, peripheral vascular disease and underlying COPD.    Patient was admitted for treatment of heart failure.  Transition of care CM note reveals that current discharge plan is to go home with home health services and home oxygen. Patient is from home with wife.  Plan:  Will verify current primary care provider to follow-up needs. Continue to follow for progression and disposition to assess for post hospital care management needs, once primary care provider verified.      Of note, [facility] Care Management services does not replace or interfere with any services that are arranged by transition of care case management or social work.    For questions and additional information, please call:  Sammye Staff A. Glyndon Tursi, BSN, RN-BC [facility] Liaison Cell: [phone]

## 2019-10-03 NOTE — TOC Progression Note (Signed)
Transition of Care [facility]) - Progression Note    Patient Details  Name: Logan White MRN: [id number] Date of Birth: [birth year]  Transition of Care [facility]) CM/SW Contact  Collen Hostler, Juliann Pulse, RN Phone Number: 10/03/2019, 1:43 PM  Clinical Narrative:   Qualifies for home 02-02 sats good for 2 days of d/c-will need home 02 order @ d/c.Qualifies for CHF protocal-HHRN-heart failure order & face to face.     Expected Discharge Plan: [place] Barriers to Discharge: Continued Medical Work up  Expected Discharge Plan and Services Expected Discharge Plan: [place]   Discharge Planning Services: CM Consult   Living arrangements for the past 2 months: Single Family Home                                       Social Determinants of Health (SDOH) Interventions    Readmission Risk Interventions Readmission Risk Prevention Plan 09/30/2019  Transportation Screening Complete  PCP or Specialist Appt within 3-5 Days Complete  HRI or [place] Complete  Social Work Consult for [place] Planning/Counseling Complete  Palliative Care Screening Not Applicable  Medication Review [employer]) Complete  Some recent data might be hidden

## 2019-10-03 NOTE — Progress Notes (Signed)
Progress Note  Patient Name: Logan White Date of Encounter: 10/03/2019  Primary Cardiologist: Kirk Ruths, MD  Subjective   Dramatic UOP noted yesterday after IV Lasix/metolazone, - 4.2L. Now net -5.1L. Weight pending this AM. Patient remains vague about how he is feeling, "I'm alright." Maybe better than before. "I feel fine."  Inpatient Medications    Scheduled Meds: . albuterol  2.5 mg Nebulization BID  . apixaban  5 mg Oral BID  . aspirin EC  81 mg Oral Daily  . atorvastatin  80 mg Oral q1800  . hydrALAZINE  10 mg Oral Q8H  . isosorbide dinitrate  20 mg Oral TID  . methylPREDNISolone (SOLU-MEDROL) injection  40 mg Intravenous Daily  . metoprolol succinate  100 mg Oral Daily  . mometasone-formoterol  2 puff Inhalation BID  . oxymetazoline  1 spray Each Nare BID  . potassium chloride  40 mEq Oral BID  . sodium chloride flush  3 mL Intravenous Q12H  . sodium chloride flush  3 mL Intravenous Q12H  . sodium chloride flush  3 mL Intravenous Q12H   Continuous Infusions: . sodium chloride    . sodium chloride     PRN Meds: sodium chloride, sodium chloride, acetaminophen, albuterol, nitroGLYCERIN, ondansetron (ZOFRAN) IV, sodium chloride flush, sodium chloride flush   Vital Signs    Vitals:   10/02/19 1627 10/02/19 1916 10/02/19 2043 10/03/19 0515  BP: 121/64  120/65 117/78  Pulse: 78  65 97  Resp: 18  18 18   Temp: 97.9 F (36.6 C)  98.3 F (36.8 C) 98 F (36.7 C)  TempSrc: Oral  Oral   SpO2: 100% 92% 98% 100%  Weight:      Height:        Intake/Output Summary (Last 24 hours) at 10/03/2019 0802 Last data filed at 10/03/2019 0600 Gross per 24 hour  Intake 910 ml  Output 4275 ml  Net -3365 ml   Last 3 Weights 10/01/2019 09/29/2019 09/28/2019  Weight (lbs) 182 lb 177 lb 1.6 oz 178 lb 3.2 oz  Weight (kg) 82.555 kg 80.332 kg 80.831 kg     Telemetry    Atrial fib with frequent PVCs - Personally Reviewed  Physical Exam   GEN: No acute distress.  Chronically ill appearing, frail.  HEENT: Normocephalic, atraumatic, sclera non-icteric. Neck: +JVD Cardiac: Irregularly irregular, rate controlled, no murmurs, rubs, or gallops.  Radials/DP/PT 1+ and equal bilaterally.  Respiratory: Diffusely rhonchorous throughout. Now wheezing or rales. Breathing is unlabored. GI: Soft, nontender, non-distended, BS +x 4. MS: no deformity. Extremities: No clubbing or cyanosis. Mild taut BLE edema. Distal pedal pulses are 2+ and equal bilaterally. Neuro:  AAOx3. Follows commands. Psych:  Responds to questions appropriately with a normal affect.  Labs    High Sensitivity Troponin:  No results for input(s): TROPONINIHS in the last 720 hours.    Cardiac EnzymesNo results for input(s): TROPONINI in the last 168 hours. No results for input(s): TROPIPOC in the last 168 hours.   Chemistry Recent Labs  Lab 09/28/19 1829 09/28/19 2342 10/02/19 0324 10/02/19 0324 10/02/19 1323 10/02/19 2106 10/03/19 0427  NA 133*   < > 134*   < > 137  136 134* 134*  K 5.6*   < > 3.6   < > 3.2*  3.2* 3.2* 3.1*  CL 99   < > 96*  --   --  91* 92*  CO2 23   < > 29  --   --  28 30  GLUCOSE 105*   < > 138*  --   --  292* 177*  BUN 49*   < > 81*  --   --  92* 96*  CREATININE 2.60*   < > 2.38*  --   --  2.42* 2.26*  CALCIUM 9.2   < > 8.8*  --   --  8.8* 9.2  PROT 8.0  --   --   --   --   --   --   ALBUMIN 3.8  --   --   --   --   --   --   AST 32  --   --   --   --   --   --   ALT 25  --   --   --   --   --   --   ALKPHOS 186*  --   --   --   --   --   --   BILITOT 2.9*  --   --   --   --   --   --   GFRNONAA 23*   < > 26*  --   --  25* 27*  GFRAA 27*   < > 30*  --   --  29* 31*  ANIONGAP 11   < > 9  --   --  15 12   < > = values in this interval not displayed.     Hematology Recent Labs  Lab 09/29/19 0506 09/29/19 0506 10/01/19 0557 10/02/19 0324 10/02/19 1323  WBC 6.2  --  8.4 10.1  --   RBC 5.18  --  4.65 4.48  --   HGB 13.7   < > 12.2* 11.9* 13.3   12.9*  HCT 39.3   < > 34.8* 33.6* 39.0  38.0*  MCV 75.9*  --  74.8* 75.0*  --   MCH 26.4  --  26.2 26.6  --   MCHC 34.9  --  35.1 35.4  --   RDW 16.6*  --  16.2* 16.2*  --   PLT 147*  --  117* 149*  --    < > = values in this interval not displayed.    BNP Recent Labs  Lab 09/28/19 1829  BNP 2,423.4*     DDimer No results for input(s): DDIMER in the last 168 hours.   Radiology    CARDIAC CATHETERIZATION  Result Date: 10/02/2019  Moderate pulmonary hypertension with mean PA pressure of 42 mmHg.  Mean pulmonary capillary wedge pressure 28 mmHg.  Phasic pulmonary artery pressure 63/29 mmHg, mean RA pressure 20 mmHg, and pulmonary artery pulsatility index is 1.7 (suggesting RV dysfunction)  Mixed venous O2 saturation 57%   Cardiac Studies   2D echo 09/29/19 IMPRESSIONS 1. Left ventricular ejection fraction, by visual estimation, is 25 to 30%. The left ventricle has severely decreased function. There is mildly increased left ventricular hypertrophy. 2. Left ventricular diastolic function could not be evaluated. 3. Severely dilated left ventricular internal cavity size. 4. The left ventricle demonstrates global hypokinesis. 5. Global right ventricle has severely reduced systolic function.The right ventricular size is mildly enlarged. 6. Left atrial size was severely dilated. 7. The interatrial septum is aneurysmal. 8. Right atrial size was severely dilated. 9. The mitral valve is normal in structure. Moderate mitral valve regurgitation. No evidence of mitral stenosis. 10. The tricuspid valve is normal in structure. Tricuspid valve regurgitation is mild. 11. The aortic valve is tricuspid. Aortic valve  regurgitation is mild. Mild to moderate aortic valve sclerosis/calcification without any evidence of aortic stenosis. 12. The pulmonic valve was normal in structure. Pulmonic valve regurgitation is mild. 13. Moderately elevated pulmonary artery systolic pressure. 14. The  inferior vena cava is dilated in size with <50% respiratory variability, suggesting right atrial pressure of 15 mmHg. 15. Severe global reduction in LV systolic function; mild LVH; 4 chamber enlargement; severe RV dysfunction; mild AI; moderate MR; mild TR; moderate pulmonary hypertension.   RHC 10/02/19  Moderate pulmonary hypertension with mean PA pressure of 42 mmHg.  Mean pulmonary capillary wedge pressure 28 mmHg.  Phasic pulmonary artery pressure 63/29 mmHg, mean RA pressure 20 mmHg, and pulmonary artery pulsatility index is 1.7 (suggesting RV dysfunction)  Mixed venous O2 saturation 57%   Patient Profile     79 y.o. male with history of chronic combined CHF 2/2 ischemic CM, coronary artery disease (aborted anterior STEMI s/p PCI to LAD in 2004, NSTEMI 2014 with occluded LCx s/p unsuccessful PCI), prior VF arrest in the setting of hypokalemia 2013, permanent atrial fibrillation, chronic kidney disease stage III, peripheral arterial disease, AAA (Dr. [name] plans f/u US 2022), mitral regurgitation, and COPDwho is beingfollowed by cardiologyfor the evaluation of decompensatedcongestive heart failureand syncope.  Assessment & Plan    1. Acute on chronic combined CHF (severe biventricular failure)- s/p RHC 10/02/19 showing continued volume overload. Initially he did not respond well to traditional diuretic dosing so has received combo of IV Lasix/metolazone the last 2 days - dramatic UOP overnight. He hides volume well. Will review plans for additional diuretic dosing with MD. Karen Kays to add ACEi/ARB/ARNI due to CKD. Continue metoprolol, hydralazine, isordil - no room to titrate further given his tendency for low-normal BPs.  2. Syncope:Episode occurred while sitting on a stool in his bedroom with associated loss of bowels with unclear etiology. Given medical history, discussed with MD whether EP needs to see. Dr. Audie Box feels episode was likely orthostatic in nature and recommends OP  monitor. Discussed no driving until cleared by cardiologist with patient.  3. CAD:EKG stable with no ischemic changes, no anginal complaints. Continue medical therapy.   4. Permanent atrial fibrillation, also with PVCs this admission:Rates controlled, HR 80s with PVCs. Continue metoprolol. Apixaban resumed last night.  5. Mitral regurgitation:Noted to be moderate on echocardiogram, stable from prior study. Continue monitoring.  6. Hyperkalemia on admission, now with hypokalemia s/p diuresis: Written for 23meq BID. Follow closely. Will also add Mag to labs.   7. COPD:Likely a contributorto SOBonpresentation, on IV steroids and nebulizers. Continue management per primary team  8.Acute on chronic CKD stage III-IV:Creatinine holding steady in 2.2-2.4 range this admission so far. Previous baseline appears to be in the 1.5-1.8 range. Continue to monitor closely with aggressive diuresis. Will need OP nephrology monitoring as well.  For questions or updates, please contact [place] Please consult [email or web address] for contact info under Cardiology/STEMI.  Signed, Charlie Pitter, PA-C 10/03/2019, 8:02 AM

## 2019-10-04 ENCOUNTER — Telehealth: Payer: Self-pay | Admitting: Physician Assistant

## 2019-10-04 LAB — BASIC METABOLIC PANEL
Anion gap: 14 (ref 5–15)
BUN: 96 mg/dL — ABNORMAL HIGH (ref 8–23)
CO2: 30 mmol/L (ref 22–32)
Calcium: 9.1 mg/dL (ref 8.9–10.3)
Chloride: 91 mmol/L — ABNORMAL LOW (ref 98–111)
Creatinine, Ser: 2.08 mg/dL — ABNORMAL HIGH (ref 0.61–1.24)
GFR calc Af Amer: 35 mL/min — ABNORMAL LOW (ref >60)
GFR calc non Af Amer: 30 mL/min — ABNORMAL LOW (ref >60)
Glucose, Bld: 176 mg/dL — ABNORMAL HIGH (ref 70–99)
Potassium: 3.6 mmol/L (ref 3.5–5.1)
Sodium: 135 mmol/L (ref 135–145)

## 2019-10-04 LAB — CBC
HCT: 31.5 % — ABNORMAL LOW (ref 39.0–52.0)
Hemoglobin: 11.4 g/dL — ABNORMAL LOW (ref 13.0–17.0)
MCH: 27 pg (ref 26.0–34.0)
MCHC: 36.2 g/dL — ABNORMAL HIGH (ref 30.0–36.0)
MCV: 74.5 fL — ABNORMAL LOW (ref 80.0–100.0)
Platelets: 142 10*3/uL — ABNORMAL LOW (ref 150–400)
RBC: 4.23 MIL/uL (ref 4.22–5.81)
RDW: 16 % — ABNORMAL HIGH (ref 11.5–15.5)
WBC: 8.9 10*3/uL (ref 4.0–10.5)
nRBC: 0 % (ref 0.0–0.2)

## 2019-10-04 LAB — MAGNESIUM: Magnesium: 2.3 mg/dL (ref 1.7–2.4)

## 2019-10-04 MED ORDER — POTASSIUM CHLORIDE CRYS ER 20 MEQ PO TBCR
40.0000 meq | EXTENDED_RELEASE_TABLET | Freq: Two times a day (BID) | ORAL | Status: AC
  Administered 2019-10-04 (×2): 40 meq via ORAL
  Filled 2019-10-04 (×2): qty 2

## 2019-10-04 MED ORDER — METOPROLOL SUCCINATE ER 50 MG PO TB24
150.0000 mg | ORAL_TABLET | Freq: Every day | ORAL | Status: DC
  Administered 2019-10-05 – 2019-10-06 (×2): 150 mg via ORAL
  Filled 2019-10-04 (×2): qty 1

## 2019-10-04 MED ORDER — PREDNISONE 20 MG PO TABS
20.0000 mg | ORAL_TABLET | Freq: Every day | ORAL | Status: DC
  Administered 2019-10-04 – 2019-10-06 (×3): 20 mg via ORAL
  Filled 2019-10-04 (×3): qty 1

## 2019-10-04 MED ORDER — FUROSEMIDE 10 MG/ML IJ SOLN
120.0000 mg | Freq: Two times a day (BID) | INTRAVENOUS | Status: AC
  Administered 2019-10-04 (×2): 120 mg via INTRAVENOUS
  Filled 2019-10-04: qty 12
  Filled 2019-10-04: qty 10

## 2019-10-04 MED ORDER — METOPROLOL SUCCINATE ER 50 MG PO TB24
50.0000 mg | ORAL_TABLET | Freq: Every day | ORAL | Status: DC
  Administered 2019-10-04 – 2019-10-06 (×3): 50 mg via ORAL
  Filled 2019-10-04 (×3): qty 1

## 2019-10-04 NOTE — Progress Notes (Signed)
PROGRESS NOTE    Logan White  TML:[id number] DOB: 03/25/1941 DOA: 09/28/2019 PCP: No primary care provider on file.    Brief Narrative:  79 year old gentleman with extensive cardiac history including chronic systolic congestive heart failure with known ejection fraction of 30%, chronic A. fib on anticoagulation with Eliquis, previous V. fib arrest in 2014, ischemic cardiomyopathy, coronary artery disease, peripheral vascular disease and underlying COPD.  He is managed outpatient, recently increased dose of torsemide, despite that continued to have significant exertional dyspnea and orthopnea.  On the day of admission, he had an episode of severe shortness of breath on walking to the bathroom and near syncopal episode so EMS was called and he was brought to ER. In the emergency room 87% on room air.  Admitted for treatment with heart failure.  Assessment & Plan:   Principal Problem:   CHF (congestive heart failure), NYHA class IV, acute, systolic (HCC) Active Problems:   VF (ventricular fibrillation) (HCC)  Acute hypoxic respiratory failure:  acute on chronic systolic congestive heart failure: Good clinical improvement now with high-dose diuretics.  Echocardiogram with ejection fraction 25%.    Followed by cardiology.  Status post right heart cath. Keep on oxygen to keep saturations more than 89%.  Mobilize. May need home oxygen.  Hyperkalemia on presentation: Hypokalemic now.  Replaced further along with diuretics.  Acute on chronic kidney disease stage III: historical creatinine 1.3-1.4. Worsening serum creatinine probably due to acute congestive heart failure.  Renal ultrasound with no obstruction or hydronephrosis.  Medical renal disease. Creatinine is stabilizing and improving with high-dose diuretics. Urine output more than 4L last 24 hours.  Chronic A. fib: Rate controlled.  Back on Eliquis now.  Hypertension: Blood pressures fairly stable.  Unable to tolerate additional  medications.  Chronic bronchitis with acute exacerbation: Started on aggressive bronchodilator therapy, chest physiotherapy.  On IV steroids, will taper off to oral steroids today. Will prescribe steroid inhaler and nebulizer on discharge.  DVT prophylaxis: Eliquis. Code Status: Full code Family Communication: Patient talking with family. Disposition Plan: patient is from home. Anticipated DC to home, Barriers to discharge active treatment , still on treatment with significant symptoms.  Anticipate discharge tomorrow with home oxygen.  Consultants:   Cardiology  Procedures:   None  Antimicrobials:   None   Subjective: Seen and examined.  No overnight events.  Wondering about going home. Objective: Vitals:   10/03/19 2016 10/04/19 0517 10/04/19 0806 10/04/19 1003  BP: 111/62 102/65  103/67  Pulse: 91 100  98  Resp: 20 18    Temp: 97.6 F (36.4 C) (!) 97.5 F (36.4 C)    TempSrc: Oral Oral    SpO2: 100% 100% 98%   Weight:      Height:        Intake/Output Summary (Last 24 hours) at 10/04/2019 1114 Last data filed at 10/04/2019 1019 Gross per 24 hour  Intake 1905 ml  Output 4475 ml  Net -2570 ml   Filed Weights   09/29/19 2200 10/01/19 0402 10/03/19 0836  Weight: 80.3 kg 82.6 kg 75.4 kg    Examination:  General exam: Appears calm and comfortable, on 2 L of oxygen. Respiratory system: Bilateral expiratory wheezes.  Fine crackles at posterior base. Cardiovascular system: S1 & S2 heard, irregularly irregular.  Shiny chronic ischemic bilateral legs.  Patient has peripheral vascular disease.  Mostly chronic nonpedal edema. Gastrointestinal system: Abdomen is nondistended, soft and nontender.  Central nervous system: Alert and oriented. No focal neurological deficits.  Extremities: Symmetric 5 x 5 power. Skin: No rashes, lesions or ulcers Psychiatry: Judgement and insight appear normal. Mood & affect appropriate.     Data Reviewed: I have personally reviewed  following labs and imaging studies  CBC: Recent Labs  Lab 09/28/19 1829 09/28/19 1829 09/29/19 0506 10/01/19 0557 10/02/19 0324 10/02/19 1323 10/04/19 0451  WBC 5.8  --  6.2 8.4 10.1  --  8.9  NEUTROABS 2.8  --   --   --   --   --   --   HGB 15.3   < > 13.7 12.2* 11.9* 13.3  12.9* 11.4*  HCT 43.7   < > 39.3 34.8* 33.6* 39.0  38.0* 31.5*  MCV 76.1*  --  75.9* 74.8* 75.0*  --  74.5*  PLT 185  --  147* 117* 149*  --  142*   < > = values in this interval not displayed.   Basic Metabolic Panel: Recent Labs  Lab 09/30/19 0455 10/01/19 0557 10/02/19 0324 10/02/19 0324 10/02/19 1323 10/02/19 2106 10/03/19 0427 10/03/19 1138 10/04/19 0451  NA 133*   < > 134*   < > 137  136 134* 134* 135 135  K 3.9   < > 3.6   < > 3.2*  3.2* 3.2* 3.1* 3.5 3.6  CL 98   < > 96*  --   --  91* 92* 94* 91*  CO2 25   < > 29  --   --  28 30 30 30   GLUCOSE 174*   < > 138*  --   --  292* 177* 158* 176*  BUN 51*   < > 81*  --   --  92* 96* 96* 96*  CREATININE 2.48*   < > 2.38*  --   --  2.42* 2.26* 2.04* 2.08*  CALCIUM 8.6*   < > 8.8*  --   --  8.8* 9.2 8.9 9.1  MG 2.3  --   --   --   --   --  2.3  --  2.3  PHOS 5.4*  --   --   --   --   --   --   --   --    < > = values in this interval not displayed.   GFR: Estimated Creatinine Clearance: 32.2 mL/min (A) (by C-G formula based on SCr of 2.08 mg/dL (H)). Liver Function Tests: Recent Labs  Lab 09/28/19 1829  AST 32  ALT 25  ALKPHOS 186*  BILITOT 2.9*  PROT 8.0  ALBUMIN 3.8   No results for input(s): LIPASE, AMYLASE in the last 168 hours. No results for input(s): AMMONIA in the last 168 hours. Coagulation Profile: Recent Labs  Lab 09/30/19 1754  INR 2.5*   Cardiac Enzymes: No results for input(s): CKTOTAL, CKMB, CKMBINDEX, TROPONINI in the last 168 hours. BNP (last 3 results) Recent Labs    08/15/19 0949  PROBNP 16,511*   HbA1C: No results for input(s): HGBA1C in the last 72 hours. CBG: Recent Labs  Lab 10/02/19 1351   GLUCAP 128*   Lipid Profile: No results for input(s): CHOL, HDL, LDLCALC, TRIG, CHOLHDL, LDLDIRECT in the last 72 hours. Thyroid Function Tests: No results for input(s): TSH, T4TOTAL, FREET4, T3FREE, THYROIDAB in the last 72 hours. Anemia Panel: No results for input(s): VITAMINB12, FOLATE, FERRITIN, TIBC, IRON, RETICCTPCT in the last 72 hours. Sepsis Labs: No results for input(s): PROCALCITON, LATICACIDVEN in the last 168 hours.  Recent Results (from the past 240 hour(s))  Respiratory Panel by RT PCR (Flu A&B, Covid) - Nasopharyngeal Swab     Status: None   Collection Time: 09/28/19  6:29 PM   Specimen: Nasopharyngeal Swab  Result Value Ref Range Status   SARS Coronavirus 2 by RT PCR NEGATIVE NEGATIVE Final    Comment: (NOTE) SARS-CoV-2 target nucleic acids are NOT DETECTED. The SARS-CoV-2 RNA is generally detectable in upper respiratoy specimens during the acute phase of infection. The lowest concentration of SARS-CoV-2 viral copies this assay can detect is 131 copies/mL. A negative result does not preclude SARS-Cov-2 infection and should not be used as the sole basis for treatment or other patient management decisions. A negative result may occur with  improper specimen collection/handling, submission of specimen other than nasopharyngeal swab, presence of viral mutation(s) within the areas targeted by this assay, and inadequate number of viral copies (<131 copies/mL). A negative result must be combined with clinical observations, patient history, and epidemiological information. The expected result is Negative. Fact Sheet for Patients:  PinkCheek.be Fact Sheet for Healthcare Providers:  GravelBags.it This test is not yet ap proved or cleared by the [place] FDA and  has been authorized for detection and/or diagnosis of SARS-CoV-2 by FDA under an Emergency Use Authorization (EUA). This EUA will remain  in effect  (meaning this test can be used) for the duration of the COVID-19 declaration under Section 564(b)(1) of the Act, 21 U.S.C. section 360bbb-3(b)(1), unless the authorization is terminated or revoked sooner.    Influenza A by PCR NEGATIVE NEGATIVE Final   Influenza B by PCR NEGATIVE NEGATIVE Final    Comment: (NOTE) The Xpert Xpress SARS-CoV-2/FLU/RSV assay is intended as an aid in  the diagnosis of influenza from Nasopharyngeal swab specimens and  should not be used as a sole basis for treatment. Nasal washings and  aspirates are unacceptable for Xpert Xpress SARS-CoV-2/FLU/RSV  testing. Fact Sheet for Patients: PinkCheek.be Fact Sheet for Healthcare Providers: GravelBags.it This test is not yet approved or cleared by the [place] FDA and  has been authorized for detection and/or diagnosis of SARS-CoV-2 by  FDA under an Emergency Use Authorization (EUA). This EUA will remain  in effect (meaning this test can be used) for the duration of the  Covid-19 declaration under Section 564(b)(1) of the Act, 21  U.S.C. section 360bbb-3(b)(1), unless the authorization is  terminated or revoked. Performed at [facility], [place] [address], [place], [place] [address]          Radiology Studies: CARDIAC CATHETERIZATION  Result Date: 10/02/2019  Moderate pulmonary hypertension with mean PA pressure of 42 mmHg.  Mean pulmonary capillary wedge pressure 28 mmHg.  Phasic pulmonary artery pressure 63/29 mmHg, mean RA pressure 20 mmHg, and pulmonary artery pulsatility index is 1.7 (suggesting RV dysfunction)  Mixed venous O2 saturation 57%       Scheduled Meds: . apixaban  5 mg Oral BID  . aspirin EC  81 mg Oral Daily  . atorvastatin  80 mg Oral q1800  . hydrALAZINE  10 mg Oral Q8H  . isosorbide dinitrate  20 mg Oral TID  . [START ON 10/05/2019] metoprolol succinate  150 mg Oral Daily  . metoprolol succinate   50 mg Oral Daily  . mometasone-formoterol  2 puff Inhalation BID  . oxymetazoline  1 spray Each Nare BID  . potassium chloride  40 mEq Oral BID  . predniSONE  20 mg Oral Q breakfast  . sodium chloride flush  3 mL Intravenous Q12H  .  sodium chloride flush  3 mL Intravenous Q12H  . sodium chloride flush  3 mL Intravenous Q12H   Continuous Infusions: . sodium chloride    . sodium chloride    . furosemide 120 mg (10/04/19 0743)     LOS: 6 days    Time spent: 25  minutes    Barb Merino, MD [facility] Pager [phone]

## 2019-10-04 NOTE — Care Management Important Message (Signed)
Important Message  Patient Details IM Letter given to Dessa Phi RN Case Manager to present to the Patient Name: Logan White MRN: [id number] Date of Birth: April 17, 1941   Medicare Important Message Given:  Yes     Kerin Salen 10/04/2019, 12:32 PM

## 2019-10-04 NOTE — Progress Notes (Signed)
Cardiology Progress Note  Patient ID: Logan White MRN: [id number] DOB: [birth year] Date of Encounter: 10/04/2019  Primary Cardiologist: Kirk Ruths, MD  Subjective  Good urine output.  Net -2.0 L.  Kidney function stable.  Still bit volume up.  I think 1 more day of IV diuresis is needed.  ROS:  All other ROS reviewed and negative. Pertinent positives noted in the HPI.     Inpatient Medications  Scheduled Meds: . apixaban  5 mg Oral BID  . aspirin EC  81 mg Oral Daily  . atorvastatin  80 mg Oral q1800  . hydrALAZINE  10 mg Oral Q8H  . isosorbide dinitrate  20 mg Oral TID  . metoprolol succinate  100 mg Oral Daily  . mometasone-formoterol  2 puff Inhalation BID  . oxymetazoline  1 spray Each Nare BID  . potassium chloride  40 mEq Oral BID  . predniSONE  20 mg Oral Q breakfast  . sodium chloride flush  3 mL Intravenous Q12H  . sodium chloride flush  3 mL Intravenous Q12H  . sodium chloride flush  3 mL Intravenous Q12H   Continuous Infusions: . sodium chloride    . sodium chloride    . furosemide 120 mg (10/04/19 0743)   PRN Meds: sodium chloride, sodium chloride, acetaminophen, albuterol, nitroGLYCERIN, ondansetron (ZOFRAN) IV, sodium chloride flush, sodium chloride flush   Vital Signs   Vitals:   10/03/19 2016 10/04/19 0517 10/04/19 0806 10/04/19 1003  BP: 111/62 102/65  103/67  Pulse: 91 100  98  Resp: 20 18    Temp: 97.6 F (36.4 C) (!) 97.5 F (36.4 C)    TempSrc: Oral Oral    SpO2: 100% 100% 98%   Weight:      Height:        Intake/Output Summary (Last 24 hours) at 10/04/2019 1007 Last data filed at 10/04/2019 0500 Gross per 24 hour  Intake 1605 ml  Output 3800 ml  Net -2195 ml   Last 3 Weights 10/03/2019 10/01/2019 09/29/2019  Weight (lbs) 166 lb 4.8 oz 182 lb 177 lb 1.6 oz  Weight (kg) 75.433 kg 82.555 kg 80.332 kg  Some encounter information is confidential and restricted. Go to Review Flowsheets activity to see all data.     Telemetry   Overnight telemetry shows atrial fibrillation with heart rate in 80s, frequent PVCs noted, which I personally reviewed.   Physical Exam   Vitals:   10/03/19 2016 10/04/19 0517 10/04/19 0806 10/04/19 1003  BP: 111/62 102/65  103/67  Pulse: 91 100  98  Resp: 20 18    Temp: 97.6 F (36.4 C) (!) 97.5 F (36.4 C)    TempSrc: Oral Oral    SpO2: 100% 100% 98%   Weight:      Height:         Intake/Output Summary (Last 24 hours) at 10/04/2019 1007 Last data filed at 10/04/2019 0500 Gross per 24 hour  Intake 1605 ml  Output 3800 ml  Net -2195 ml    Last 3 Weights 10/03/2019 10/01/2019 09/29/2019  Weight (lbs) 166 lb 4.8 oz 182 lb 177 lb 1.6 oz  Weight (kg) 75.433 kg 82.555 kg 80.332 kg  Some encounter information is confidential and restricted. Go to Review Flowsheets activity to see all data.    Body mass index is 20.51 kg/m.  General: Well nourished, well developed, in no acute distress Head: Atraumatic, normal size  Eyes: PEERLA, EOMI  Neck: Supple, distended external jugular veins noted Endocrine:  No thryomegaly Cardiac: Normal S1, S2; RRR; no murmurs, rubs, or gallops Lungs: Clear to auscultation bilaterally, no wheezing, rhonchi or rales  Abd: Slightly distended abdomen, he reports this is better Ext: Trace edema Musculoskeletal: No deformities, BUE and BLE strength normal and equal Skin: Warm and dry, no rashes   Neuro: Alert and oriented to person, place, time, and situation, CNII-XII grossly intact, no focal deficits  Psych: Normal mood and affect   Labs  High Sensitivity Troponin:  No results for input(s): TROPONINIHS in the last 720 hours.   Cardiac EnzymesNo results for input(s): TROPONINI in the last 168 hours. No results for input(s): TROPIPOC in the last 168 hours.  Chemistry Recent Labs  Lab 09/28/19 1829 09/28/19 2342 10/03/19 0427 10/03/19 1138 10/04/19 0451  NA 133*   < > 134* 135 135  K 5.6*   < > 3.1* 3.5 3.6  CL 99   < > 92* 94* 91*  CO2 23   < > 30  30 30   GLUCOSE 105*   < > 177* 158* 176*  BUN 49*   < > 96* 96* 96*  CREATININE 2.60*   < > 2.26* 2.04* 2.08*  CALCIUM 9.2   < > 9.2 8.9 9.1  PROT 8.0  --   --   --   --   ALBUMIN 3.8  --   --   --   --   AST 32  --   --   --   --   ALT 25  --   --   --   --   ALKPHOS 186*  --   --   --   --   BILITOT 2.9*  --   --   --   --   GFRNONAA 23*   < > 27* 31* 30*  GFRAA 27*   < > 31* 36* 35*  ANIONGAP 11   < > 12 11 14    < > = values in this interval not displayed.    Hematology Recent Labs  Lab 10/01/19 0557 10/01/19 0557 10/02/19 0324 10/02/19 1323 10/04/19 0451  WBC 8.4  --  10.1  --  8.9  RBC 4.65  --  4.48  --  4.23  HGB 12.2*   < > 11.9* 13.3  12.9* 11.4*  HCT 34.8*   < > 33.6* 39.0  38.0* 31.5*  MCV 74.8*  --  75.0*  --  74.5*  MCH 26.2  --  26.6  --  27.0  MCHC 35.1  --  35.4  --  36.2*  RDW 16.2*  --  16.2*  --  16.0*  PLT 117*  --  149*  --  142*   < > = values in this interval not displayed.   BNP Recent Labs  Lab 09/28/19 1829  BNP 2,423.4*    DDimer No results for input(s): DDIMER in the last 168 hours.   Radiology  CARDIAC CATHETERIZATION  Result Date: 10/02/2019  Moderate pulmonary hypertension with mean PA pressure of 42 mmHg.  Mean pulmonary capillary wedge pressure 28 mmHg.  Phasic pulmonary artery pressure 63/29 mmHg, mean RA pressure 20 mmHg, and pulmonary artery pulsatility index is 1.7 (suggesting RV dysfunction)  Mixed venous O2 saturation 57%   Cardiac Studies  TTE 09/29/2019  1. Left ventricular ejection fraction, by visual estimation, is 25 to 30%. The left ventricle has severely decreased function. There is mildly increased left ventricular hypertrophy.  2. Left ventricular diastolic function could not be  evaluated.  3. Severely dilated left ventricular internal cavity size.  4. The left ventricle demonstrates global hypokinesis.  5. Global right ventricle has severely reduced systolic function.The right ventricular size is mildly  enlarged.  6. Left atrial size was severely dilated.  7. The interatrial septum is aneurysmal.  8. Right atrial size was severely dilated.  9. The mitral valve is normal in structure. Moderate mitral valve regurgitation. No evidence of mitral stenosis. 10. The tricuspid valve is normal in structure. Tricuspid valve regurgitation is mild. 11. The aortic valve is tricuspid. Aortic valve regurgitation is mild. Mild to moderate aortic valve sclerosis/calcification without any evidence of aortic stenosis. 12. The pulmonic valve was normal in structure. Pulmonic valve regurgitation is mild. 13. Moderately elevated pulmonary artery systolic pressure. 14. The inferior vena cava is dilated in size with <50% respiratory variability, suggesting right atrial pressure of 15 mmHg. 15. Severe global reduction in LV systolic function; mild LVH; 4 chamber enlargement; severe RV dysfunction; mild AI; moderate MR; mild TR; moderate pulmonary hypertension.  RHC 10/02/2019  Moderate pulmonary hypertension with mean PA pressure of 42 mmHg.  Mean pulmonary capillary wedge pressure 28 mmHg.  Phasic pulmonary artery pressure 63/29 mmHg, mean RA pressure 20 mmHg, and pulmonary artery pulsatility index is 1.7 (suggesting RV dysfunction)  Mixed venous O2 saturation 57%  Patient Profile   78 y.o.malewith history ofchroniccombinedCHF 2/2 ischemic CM, coronary artery disease(aborted anterior STEMI s/p PCI to LAD in 2004, NSTEMI 2014 with occluded LCx s/p unsuccessful PCI),prior VF arrest in the setting of hypokalemia2013, permanent atrial fibrillation, chronic kidney diseasestage III, peripheral arterial disease,AAA (Dr. [name] plans f/u US 2022),mitral regurgitation, and COPDwho is beingfollowed by cardiologyfor the evaluation of decompensatedcongestive heart failureand syncope.   Assessment & Plan   Acute systolic heart failure -Was admitted with shortness of breath and elevated BNP.  Ejection  fraction reduced to 25 to 30%.  Was 35 to 40% in the past.  Initial cardiology consult team did not feel is volume overloaded.  We however, did feel he was.  Right heart cath showed gross volume overload with wedge pressure 28 mmHg -He is undergone aggressive IV diuresis and is net -2 L over the last 24 hours. -Since admission has had 12.6 L of urine output and is net -7.2 L -Still has some elevated external jugular veins -Reports appetite improved and lower extremities are improved -I would recommend 1 more day of IV diuretic therapy.  We will give him 120 mg of IV Lasix for 2 doses today.  We will give him 40 mEq of potassium with each dose of Lasix.  He will likely be able to go home tomorrow on an oral dose. -I will increase his metoprolol to 150 mg of succinate tablet daily -No ACE/ARB due to significant CKD -We will continue his hydralazine 10 mg and Isordil 20 mg 3 times daily for afterload reduction.  He will likely be transition to hydralazine and Imdur at discharge. -We will go ahead and arrange a 1 week hospital follow-up -Plan for discharge tomorrow  Syncope -Questionable history of syncope on admission.  Plan will be for monitor.  No driving until work-up is complete with monitor.  If his EF does not recover he will need ICD.  Either way, no driving until work-up is complete.  Frequent PVCs -Suspect this is largely related to volume overload.  Continue diuresis today. -I have increased his metoprolol succinate to 150 mg daily today. -Should this become problematic for him in the future  he may need to be evaluated for a PVC ablation. -For now he is not troubled by them.  His ejection fraction is a bit lower than prior but this can be worked up on an outpatient basis.  Persistent atrial fibrillation -Metoprolol for rate control.  Eliquis.  For questions or updates, please contact [facility] Please consult [email or web address] for contact info under   Signed, [place] T. Audie Box,  [place]  10/04/2019 10:07 AM

## 2019-10-04 NOTE — Telephone Encounter (Signed)
   Attention TOC pool,  This patient will need a TOC phone call after discharge. They are being discharged likely tomorrow. Follow-up appointment has already been arranged with: 2/3 with Almyra Deforest PA-C. They are a patient of Kirk Ruths, MD.  Thank you! Charlie Pitter

## 2019-10-04 NOTE — Progress Notes (Signed)
   Per d/w Dr. Audie Box, will keep previously existing f/u appt scheduled 10/09/19 with Almyra Deforest. Phone note sent to [facility] pool to make sure he gets phone call after discharge. Dr. Audie Box anticipates dc tomorrow.  Adolpho Meenach PA-C

## 2019-10-04 NOTE — Plan of Care (Signed)

## 2019-10-05 DIAGNOSIS — I255 Ischemic cardiomyopathy: Secondary | ICD-10-CM

## 2019-10-05 LAB — HEMOGLOBIN AND HEMATOCRIT, BLOOD
HCT: 32.8 % — ABNORMAL LOW (ref 39.0–52.0)
Hemoglobin: 11.5 g/dL — ABNORMAL LOW (ref 13.0–17.0)

## 2019-10-05 LAB — BASIC METABOLIC PANEL
Anion gap: 14 (ref 5–15)
BUN: 94 mg/dL — ABNORMAL HIGH (ref 8–23)
CO2: 36 mmol/L — ABNORMAL HIGH (ref 22–32)
Calcium: 9.3 mg/dL (ref 8.9–10.3)
Chloride: 84 mmol/L — ABNORMAL LOW (ref 98–111)
Creatinine, Ser: 1.87 mg/dL — ABNORMAL HIGH (ref 0.61–1.24)
GFR calc Af Amer: 40 mL/min — ABNORMAL LOW (ref >60)
GFR calc non Af Amer: 34 mL/min — ABNORMAL LOW (ref >60)
Glucose, Bld: 151 mg/dL — ABNORMAL HIGH (ref 70–99)
Potassium: 3.2 mmol/L — ABNORMAL LOW (ref 3.5–5.1)
Sodium: 134 mmol/L — ABNORMAL LOW (ref 135–145)

## 2019-10-05 LAB — MAGNESIUM: Magnesium: 2.1 mg/dL (ref 1.7–2.4)

## 2019-10-05 MED ORDER — TORSEMIDE 20 MG PO TABS
60.0000 mg | ORAL_TABLET | Freq: Two times a day (BID) | ORAL | Status: DC
  Administered 2019-10-05 – 2019-10-06 (×3): 60 mg via ORAL
  Filled 2019-10-05 (×4): qty 3

## 2019-10-05 MED ORDER — TRAMADOL HCL 50 MG PO TABS: 50.0000 mg | ORAL_TABLET | Freq: Four times a day (QID) | ORAL | Status: DC | PRN

## 2019-10-05 MED ORDER — POTASSIUM CHLORIDE CRYS ER 20 MEQ PO TBCR
40.0000 meq | EXTENDED_RELEASE_TABLET | ORAL | Status: AC
  Administered 2019-10-05 (×2): 40 meq via ORAL
  Filled 2019-10-05 (×2): qty 2

## 2019-10-05 MED ORDER — POTASSIUM CHLORIDE CRYS ER 20 MEQ PO TBCR
40.0000 meq | EXTENDED_RELEASE_TABLET | Freq: Once | ORAL | Status: AC
  Administered 2019-10-05: 40 meq via ORAL
  Filled 2019-10-05: qty 2

## 2019-10-05 NOTE — Progress Notes (Signed)
PROGRESS NOTE    Logan White  PYP:[id number] DOB: 1940-09-21 DOA: 09/28/2019 PCP: No primary care provider on file.    Brief Narrative:  79 year old gentleman with extensive cardiac history including chronic systolic congestive heart failure with known ejection fraction of 30%, chronic A. fib on anticoagulation with Eliquis, previous V. fib arrest in 2014, ischemic cardiomyopathy, coronary artery disease, peripheral vascular disease and underlying COPD.  He is managed outpatient, recently increased dose of torsemide, despite that continued to have significant exertional dyspnea and orthopnea.  On the day of admission, he had an episode of severe shortness of breath on walking to the bathroom and near syncopal episode so EMS was called and he was brought to ER. In the emergency room 87% on room air.  Admitted for treatment with heart failure.  Assessment & Plan:   Principal Problem:   CHF (congestive heart failure), NYHA class IV, acute, systolic (HCC) Active Problems:   VF (ventricular fibrillation) (HCC)  Acute hypoxic respiratory failure:  acute on chronic systolic congestive heart failure: Good clinical improvement with high-dose IV diuretics.  Change to oral Demadex today.  Echocardiogram with ejection fraction 25%.    Followed by cardiology.  Status post right heart cath. On increasing dose of beta-blockers. Keep on oxygen to keep saturations more than 89%.  Mobilize. May need home oxygen, will need repeat evaluation tomorrow.  Hyperkalemia on presentation: Hypokalemic now.  Replaced further along with diuretics.  Recheck levels tomorrow morning.  We will also check magnesium.  Acute on chronic kidney disease stage III: historical creatinine 1.3-1.4. Worsening serum creatinine probably due to acute congestive heart failure.  Renal ultrasound with no obstruction or hydronephrosis.  Medical renal disease. Creatinine is stabilizing and improving with high-dose  diuretics. Adequate urine output.  Chronic A. fib: Rate controlled.  Was on heparin for procedure, back on Eliquis now.  Hypertension: Blood pressures fairly stable.  Blood pressures remain low.  Occasionally holding nitrates due to low blood pressures.  Able to use beta-blockers, increased dose today.  Chronic bronchitis with acute exacerbation: Started on aggressive bronchodilator therapy, chest physiotherapy.  Good response to IV steroids, tapered to oral steroids. Will need oral steroid tapers, steroid inhaler and nebulizer machine on discharge.  DVT prophylaxis: Eliquis. Code Status: Full code Family Communication: Patient talking with family. Disposition Plan: patient is from home. Anticipated DC to home, Barriers to discharge active treatment , still on treatment with significant symptoms.  Anticipate discharge tomorrow with home oxygen if adequate improvement.  Consultants:   Cardiology  Procedures:   None  Antimicrobials:   None   Subjective: Seen and examined.  Patient thinks his breathing is better.  No other overnight events.  On 1 to 2 L of oxygen. Patient was not happy not able to go home.  Patient could not eat breakfast with too much restrictions and was very anxious.  Objective: Vitals:   10/05/19 0806 10/05/19 0808 10/05/19 0935 10/05/19 1255  BP:  98/62 118/78 124/77  Pulse: 81 81 83 91  Resp: 18 18 18 20   Temp:  97.7 F (36.5 C) 97.8 F (36.6 C) 98.4 F (36.9 C)  TempSrc:  Oral Oral Oral  SpO2: 97% 98% 97% 97%  Weight:      Height:        Intake/Output Summary (Last 24 hours) at 10/05/2019 1440 Last data filed at 10/05/2019 1346 Gross per 24 hour  Intake 863.72 ml  Output 5250 ml  Net -4386.28 ml   [employer]  10/01/19 0402 10/03/19 0836 10/05/19 0436  Weight: 82.6 kg 75.4 kg 71.8 kg    Examination:  General exam: Appears calm and comfortable, on 2 L of oxygen.  Anxious. Respiratory system: Bilateral expiratory wheezes.    Cardiovascular system: S1 & S2 heard, irregularly irregular.  Shiny chronic ischemic bilateral legs.  Patient has peripheral vascular disease.  No pedal edema. Gastrointestinal system: Abdomen is nondistended, soft and nontender.  Central nervous system: Alert and oriented. No focal neurological deficits. Extremities: Symmetric 5 x 5 power. Skin: No rashes, lesions or ulcers Psychiatry: Judgement and insight appear normal. Mood & affect anxious.    Data Reviewed: I have personally reviewed following labs and imaging studies  CBC: Recent Labs  Lab 09/28/19 1829 09/28/19 1829 09/29/19 0506 10/01/19 0557 10/02/19 0324 10/02/19 1323 10/04/19 0451  WBC 5.8  --  6.2 8.4 10.1  --  8.9  NEUTROABS 2.8  --   --   --   --   --   --   HGB 15.3   < > 13.7 12.2* 11.9* 13.3  12.9* 11.4*  HCT 43.7   < > 39.3 34.8* 33.6* 39.0  38.0* 31.5*  MCV 76.1*  --  75.9* 74.8* 75.0*  --  74.5*  PLT 185  --  147* 117* 149*  --  142*   < > = values in this interval not displayed.   Basic Metabolic Panel: Recent Labs  Lab 09/30/19 0455 10/01/19 0557 10/02/19 2106 10/03/19 0427 10/03/19 1138 10/04/19 0451 10/05/19 0447  NA 133*   < > 134* 134* 135 135 134*  K 3.9   < > 3.2* 3.1* 3.5 3.6 3.2*  CL 98   < > 91* 92* 94* 91* 84*  CO2 25   < > 28 30 30 30  36*  GLUCOSE 174*   < > 292* 177* 158* 176* 151*  BUN 51*   < > 92* 96* 96* 96* 94*  CREATININE 2.48*   < > 2.42* 2.26* 2.04* 2.08* 1.87*  CALCIUM 8.6*   < > 8.8* 9.2 8.9 9.1 9.3  MG 2.3  --   --  2.3  --  2.3 2.1  PHOS 5.4*  --   --   --   --   --   --    < > = values in this interval not displayed.   GFR: Estimated Creatinine Clearance: 34.1 mL/min (A) (by C-G formula based on SCr of 1.87 mg/dL (H)). Liver Function Tests: Recent Labs  Lab 09/28/19 1829  AST 32  ALT 25  ALKPHOS 186*  BILITOT 2.9*  PROT 8.0  ALBUMIN 3.8   No results for input(s): LIPASE, AMYLASE in the last 168 hours. No results for input(s): AMMONIA in the last 168  hours. Coagulation Profile: Recent Labs  Lab 09/30/19 1754  INR 2.5*   Cardiac Enzymes: No results for input(s): CKTOTAL, CKMB, CKMBINDEX, TROPONINI in the last 168 hours. BNP (last 3 results) Recent Labs    08/15/19 0949  PROBNP 16,511*   HbA1C: No results for input(s): HGBA1C in the last 72 hours. CBG: Recent Labs  Lab 10/02/19 1351  GLUCAP 128*   Lipid Profile: No results for input(s): CHOL, HDL, LDLCALC, TRIG, CHOLHDL, LDLDIRECT in the last 72 hours. Thyroid Function Tests: No results for input(s): TSH, T4TOTAL, FREET4, T3FREE, THYROIDAB in the last 72 hours. Anemia Panel: No results for input(s): VITAMINB12, FOLATE, FERRITIN, TIBC, IRON, RETICCTPCT in the last 72 hours. Sepsis Labs: No results for input(s): PROCALCITON, LATICACIDVEN  in the last 168 hours.  Recent Results (from the past 240 hour(s))  Respiratory Panel by RT PCR (Flu A&B, Covid) - Nasopharyngeal Swab     Status: None   Collection Time: 09/28/19  6:29 PM   Specimen: Nasopharyngeal Swab  Result Value Ref Range Status   SARS Coronavirus 2 by RT PCR NEGATIVE NEGATIVE Final    Comment: (NOTE) SARS-CoV-2 target nucleic acids are NOT DETECTED. The SARS-CoV-2 RNA is generally detectable in upper respiratoy specimens during the acute phase of infection. The lowest concentration of SARS-CoV-2 viral copies this assay can detect is 131 copies/mL. A negative result does not preclude SARS-Cov-2 infection and should not be used as the sole basis for treatment or other patient management decisions. A negative result may occur with  improper specimen collection/handling, submission of specimen other than nasopharyngeal swab, presence of viral mutation(s) within the areas targeted by this assay, and inadequate number of viral copies (<131 copies/mL). A negative result must be combined with clinical observations, patient history, and epidemiological information. The expected result is Negative. Fact Sheet for  Patients:  PinkCheek.be Fact Sheet for Healthcare Providers:  GravelBags.it This test is not yet ap proved or cleared by the [place] FDA and  has been authorized for detection and/or diagnosis of SARS-CoV-2 by FDA under an Emergency Use Authorization (EUA). This EUA will remain  in effect (meaning this test can be used) for the duration of the COVID-19 declaration under Section 564(b)(1) of the Act, 21 U.S.C. section 360bbb-3(b)(1), unless the authorization is terminated or revoked sooner.    Influenza A by PCR NEGATIVE NEGATIVE Final   Influenza B by PCR NEGATIVE NEGATIVE Final    Comment: (NOTE) The Xpert Xpress SARS-CoV-2/FLU/RSV assay is intended as an aid in  the diagnosis of influenza from Nasopharyngeal swab specimens and  should not be used as a sole basis for treatment. Nasal washings and  aspirates are unacceptable for Xpert Xpress SARS-CoV-2/FLU/RSV  testing. Fact Sheet for Patients: PinkCheek.be Fact Sheet for Healthcare Providers: GravelBags.it This test is not yet approved or cleared by the [place] FDA and  has been authorized for detection and/or diagnosis of SARS-CoV-2 by  FDA under an Emergency Use Authorization (EUA). This EUA will remain  in effect (meaning this test can be used) for the duration of the  Covid-19 declaration under Section 564(b)(1) of the Act, 21  U.S.C. section 360bbb-3(b)(1), unless the authorization is  terminated or revoked. Performed at [facility], [place] [address], [place], [place] [address]          Radiology Studies: No results found.      Scheduled Meds: . apixaban  5 mg Oral BID  . aspirin EC  81 mg Oral Daily  . atorvastatin  80 mg Oral q1800  . hydrALAZINE  10 mg Oral Q8H  . isosorbide dinitrate  20 mg Oral TID  . metoprolol succinate  150 mg Oral Daily  . metoprolol  succinate  50 mg Oral Daily  . mometasone-formoterol  2 puff Inhalation BID  . oxymetazoline  1 spray Each Nare BID  . predniSONE  20 mg Oral Q breakfast  . torsemide  60 mg Oral BID   Continuous Infusions:    LOS: 7 days    Time spent: 25  minutes    Barb Merino, MD [facility] Pager [phone]

## 2019-10-05 NOTE — Progress Notes (Signed)
Progress Note   Subjective   Doing well today, the patient denies CP.  SOB is improving.  No new concerns  Inpatient Medications    Scheduled Meds: . apixaban  5 mg Oral BID  . aspirin EC  81 mg Oral Daily  . atorvastatin  80 mg Oral q1800  . hydrALAZINE  10 mg Oral Q8H  . isosorbide dinitrate  20 mg Oral TID  . metoprolol succinate  150 mg Oral Daily  . metoprolol succinate  50 mg Oral Daily  . mometasone-formoterol  2 puff Inhalation BID  . oxymetazoline  1 spray Each Nare BID  . potassium chloride  40 mEq Oral Q4H  . predniSONE  20 mg Oral Q breakfast   Continuous Infusions:  PRN Meds: acetaminophen, albuterol, nitroGLYCERIN, ondansetron (ZOFRAN) IV   Vital Signs    Vitals:   10/04/19 2145 10/05/19 0436 10/05/19 0806 10/05/19 0808  BP: (!) 130/91 107/72  98/62  Pulse: 92 99 81 81  Resp: 18 19 18 18   Temp: 98.4 F (36.9 C) 97.8 F (36.6 C)  97.7 F (36.5 C)  TempSrc: Oral Oral  Oral  SpO2: 95% 100% 97% 98%  Weight:  71.8 kg    Height:        Intake/Output Summary (Last 24 hours) at 10/05/2019 0834 Last data filed at 10/05/2019 0755 Gross per 24 hour  Intake 1163.72 ml  Output 6075 ml  Net -4911.28 ml   Filed Weights   10/01/19 0402 10/03/19 0836 10/05/19 0436  Weight: 82.6 kg 75.4 kg 71.8 kg    Telemetry    Rate controlled afib, NSVT, rare PVCs - Personally Reviewed  Physical Exam   GEN- The patient is chronically ill appearing, alert and oriented x 3 today. Wearing O2  Head- normocephalic, atraumatic Eyes-  Sclera clear, conjunctiva pink Ears- hearing intact Oropharynx- clear Neck- supple, Lungs- coarse BS throughout, few rales Heart- irregular rate and rhythm  GI- soft  Extremities- + edema  MS- diffuse atrophy Skin- no rash or lesion Psych- euthymic mood, full affect Neuro- strength and sensation are intact   Labs    Chemistry Recent Labs  Lab 09/28/19 1829 09/28/19 2342 10/03/19 1138 10/04/19 0451 10/05/19 0447  NA 133*    < > 135 135 134*  K 5.6*   < > 3.5 3.6 3.2*  CL 99   < > 94* 91* 84*  CO2 23   < > 30 30 36*  GLUCOSE 105*   < > 158* 176* 151*  BUN 49*   < > 96* 96* 94*  CREATININE 2.60*   < > 2.04* 2.08* 1.87*  CALCIUM 9.2   < > 8.9 9.1 9.3  PROT 8.0  --   --   --   --   ALBUMIN 3.8  --   --   --   --   AST 32  --   --   --   --   ALT 25  --   --   --   --   ALKPHOS 186*  --   --   --   --   BILITOT 2.9*  --   --   --   --   GFRNONAA 23*   < > 31* 30* 34*  GFRAA 27*   < > 36* 35* 40*  ANIONGAP 11   < > 11 14 14    < > = values in this interval not displayed.     Hematology Recent Labs  Lab 10/01/19 0557 10/01/19 0557 10/02/19 0324 10/02/19 1323 10/04/19 0451  WBC 8.4  --  10.1  --  8.9  RBC 4.65  --  4.48  --  4.23  HGB 12.2*   < > 11.9* 13.3  12.9* 11.4*  HCT 34.8*   < > 33.6* 39.0  38.0* 31.5*  MCV 74.8*  --  75.0*  --  74.5*  MCH 26.2  --  26.6  --  27.0  MCHC 35.1  --  35.4  --  36.2*  RDW 16.2*  --  16.2*  --  16.0*  PLT 117*  --  149*  --  142*   < > = values in this interval not displayed.       Patient Profile   79 y.o.malewith history ofchroniccombinedCHF 2/2 ischemic CM, coronary artery disease(aborted anterior STEMI s/p PCI to LAD in 2004, NSTEMI 2014 with occluded LCx s/p unsuccessful PCI),prior VF arrest in the setting of hypokalemia2013, permanent atrial fibrillation, chronic kidney diseasestage III, peripheral arterial disease,AAA (Dr. [name] plans f/u US 2022),mitral regurgitation, and COPDwho is beingfollowed by cardiologyfor the evaluation of decompensatedcongestive heart failureand syncope.   Assessment & Plan    1.  Acute on chronic systolic dysfunction/ ischemic CM Continues to improve.  Remains volume overloaded Will plan to convert to demadex 60mg  BID Sodium restriction advised Will need close outpatient follow-up with cardiology, including transitions of care Continue to hold ACE/ARB due to renal failure  2. Syncope I do not feel  that he would benefit from 30 day monitor as he already meets indication for an ICD once he is clinically improved.   No driving x 6 months Will need follow-up with arranged with Dr Lovena Le who has seen him previously for consideration of an ICD.  3. PVCs/ NSVT Metoprolol increased Follow-up with Dr Lovena Le  4. Permanent afib Rate controlled On eliquis  5. Ischemic CM/ CAD No ischemic symptoms Continue medical therapy  6. Tobacco Cessation is required for him to do well long term  Prognosis is guarded.  He is chronically quite ill with multiple co morbidities.  Thompson Grayer MD, [facility] 10/05/2019 8:34 AM

## 2019-10-05 NOTE — Plan of Care (Signed)

## 2019-10-06 LAB — CBC WITH DIFFERENTIAL/PLATELET
Abs Immature Granulocytes: 0.08 10*3/uL — ABNORMAL HIGH (ref 0.00–0.07)
Basophils Absolute: 0 10*3/uL (ref 0.0–0.1)
Basophils Relative: 0 %
Eosinophils Absolute: 0.1 10*3/uL (ref 0.0–0.5)
Eosinophils Relative: 0 %
HCT: 36.6 % — ABNORMAL LOW (ref 39.0–52.0)
Hemoglobin: 12.8 g/dL — ABNORMAL LOW (ref 13.0–17.0)
Immature Granulocytes: 1 %
Lymphocytes Relative: 12 %
Lymphs Abs: 1.7 10*3/uL (ref 0.7–4.0)
MCH: 26.3 pg (ref 26.0–34.0)
MCHC: 35 g/dL (ref 30.0–36.0)
MCV: 75.2 fL — ABNORMAL LOW (ref 80.0–100.0)
Monocytes Absolute: 1.6 10*3/uL — ABNORMAL HIGH (ref 0.1–1.0)
Monocytes Relative: 10 %
Neutro Abs: 11.6 10*3/uL — ABNORMAL HIGH (ref 1.7–7.7)
Neutrophils Relative %: 77 %
Platelets: 175 10*3/uL (ref 150–400)
RBC: 4.87 MIL/uL (ref 4.22–5.81)
RDW: 16.1 % — ABNORMAL HIGH (ref 11.5–15.5)
WBC: 15 10*3/uL — ABNORMAL HIGH (ref 4.0–10.5)
nRBC: 0.3 % — ABNORMAL HIGH (ref 0.0–0.2)

## 2019-10-06 LAB — BASIC METABOLIC PANEL
Anion gap: 12 (ref 5–15)
BUN: 94 mg/dL — ABNORMAL HIGH (ref 8–23)
CO2: 40 mmol/L — ABNORMAL HIGH (ref 22–32)
Calcium: 9.2 mg/dL (ref 8.9–10.3)
Chloride: 80 mmol/L — ABNORMAL LOW (ref 98–111)
Creatinine, Ser: 1.92 mg/dL — ABNORMAL HIGH (ref 0.61–1.24)
GFR calc Af Amer: 38 mL/min — ABNORMAL LOW (ref >60)
GFR calc non Af Amer: 33 mL/min — ABNORMAL LOW (ref >60)
Glucose, Bld: 129 mg/dL — ABNORMAL HIGH (ref 70–99)
Potassium: 3.5 mmol/L (ref 3.5–5.1)
Sodium: 132 mmol/L — ABNORMAL LOW (ref 135–145)

## 2019-10-06 LAB — MAGNESIUM: Magnesium: 2 mg/dL (ref 1.7–2.4)

## 2019-10-06 MED ORDER — FLUTICASONE-SALMETEROL 250-50 MCG/DOSE IN AEPB: 1.0000 | INHALATION_SPRAY | Freq: Two times a day (BID) | RESPIRATORY_TRACT | 0 refills | Status: AC

## 2019-10-06 MED ORDER — PREDNISONE 10 MG PO TABS: 10.0000 mg | ORAL_TABLET | Freq: Every day | ORAL | 0 refills | Status: AC

## 2019-10-06 MED ORDER — TORSEMIDE 20 MG PO TABS: 60.0000 mg | ORAL_TABLET | Freq: Two times a day (BID) | ORAL | 0 refills | Status: DC

## 2019-10-06 MED ORDER — METOPROLOL SUCCINATE ER 50 MG PO TB24: 150.0000 mg | ORAL_TABLET | Freq: Every day | ORAL | 0 refills | Status: DC

## 2019-10-06 MED ORDER — POTASSIUM CHLORIDE CRYS ER 20 MEQ PO TBCR: 20.0000 meq | EXTENDED_RELEASE_TABLET | Freq: Two times a day (BID) | ORAL | 0 refills | Status: DC

## 2019-10-06 MED ORDER — ISOSORBIDE DINITRATE 20 MG PO TABS: 20.0000 mg | ORAL_TABLET | Freq: Three times a day (TID) | ORAL | 0 refills | Status: DC

## 2019-10-06 MED ORDER — ASPIRIN 81 MG PO TBEC: 81.0000 mg | DELAYED_RELEASE_TABLET | Freq: Every day | ORAL | 0 refills | Status: DC

## 2019-10-06 MED ORDER — APIXABAN 5 MG PO TABS: 5.0000 mg | ORAL_TABLET | Freq: Two times a day (BID) | ORAL | 0 refills | Status: DC

## 2019-10-06 MED ORDER — MOMETASONE FURO-FORMOTEROL FUM 100-5 MCG/ACT IN AERO: 2.0000 | INHALATION_SPRAY | Freq: Two times a day (BID) | RESPIRATORY_TRACT | 0 refills | Status: DC

## 2019-10-06 MED ORDER — FLUTICASONE-SALMETEROL 250-50 MCG/DOSE IN AEPB: 1.0000 | INHALATION_SPRAY | Freq: Two times a day (BID) | RESPIRATORY_TRACT | 0 refills | Status: DC

## 2019-10-06 NOTE — Progress Notes (Signed)
Progress Note   Subjective   Doing well today, the patient denies CP.  SOB is improved  Wants to go home.  No new concerns  Inpatient Medications    Scheduled Meds: . apixaban  5 mg Oral BID  . aspirin EC  81 mg Oral Daily  . atorvastatin  80 mg Oral q1800  . hydrALAZINE  10 mg Oral Q8H  . isosorbide dinitrate  20 mg Oral TID  . metoprolol succinate  150 mg Oral Daily  . metoprolol succinate  50 mg Oral Daily  . mometasone-formoterol  2 puff Inhalation BID  . oxymetazoline  1 spray Each Nare BID  . predniSONE  20 mg Oral Q breakfast  . torsemide  60 mg Oral BID   Continuous Infusions:  PRN Meds: acetaminophen, albuterol, nitroGLYCERIN, ondansetron (ZOFRAN) IV, traMADol   Vital Signs    Vitals:   10/05/19 1255 10/05/19 2019 10/06/19 0526 10/06/19 0534  BP: 124/77 103/72 107/62   Pulse: 91 62 63   Resp: 20 19 20    Temp: 98.4 F (36.9 C) 98.8 F (37.1 C) 98.3 F (36.8 C)   TempSrc: Oral Oral Oral   SpO2: 97% 100% 91%   Weight:    74.8 kg  Height:        Intake/Output Summary (Last 24 hours) at 10/06/2019 0758 Last data filed at 10/06/2019 0600 Gross per 24 hour  Intake 960 ml  Output 4075 ml  Net -3115 ml   Filed Weights   10/03/19 0836 10/05/19 0436 10/06/19 0534  Weight: 75.4 kg 71.8 kg 74.8 kg    Telemetry    Rate controlled afib with PVCs - Personally Reviewed  Physical Exam   GEN- The patient is elderly and frail appearing, alert and oriented x 3 today.   Head- normocephalic, atraumatic Eyes-  Sclera clear, conjunctiva pink Ears- hearing intact Oropharynx- clear Neck- supple, Lungs- Coarse BS, normal work of breathing Heart- irregular rate and rhythm  GI- sof  Extremities- no clubbing, cyanosis, or edema  MS- diffuse atrophy Skin- no rash or lesion Psych- euthymic mood, full affect Neuro- strength and sensation are intact   Labs    Chemistry Recent Labs  Lab 10/04/19 0451 10/05/19 0447 10/06/19 0447  NA 135 134* 132*  K 3.6  3.2* 3.5  CL 91* 84* 80*  CO2 30 36* 40*  GLUCOSE 176* 151* 129*  BUN 96* 94* 94*  CREATININE 2.08* 1.87* 1.92*  CALCIUM 9.1 9.3 9.2  GFRNONAA 30* 34* 33*  GFRAA 35* 40* 38*  ANIONGAP 14 14 12      Hematology Recent Labs  Lab 10/02/19 0324 10/02/19 1323 10/04/19 0451 10/05/19 1550 10/06/19 0447  WBC 10.1  --  8.9  --  15.0*  RBC 4.48  --  4.23  --  4.87  HGB 11.9*   < > 11.4* 11.5* 12.8*  HCT 33.6*   < > 31.5* 32.8* 36.6*  MCV 75.0*  --  74.5*  --  75.2*  MCH 26.6  --  27.0  --  26.3  MCHC 35.4  --  36.2*  --  35.0  RDW 16.2*  --  16.0*  --  16.1*  PLT 149*  --  142*  --  175   < > = values in this interval not displayed.     Patient Profile   79 y.o.malewith history ofchroniccombinedCHF 2/2 ischemic CM, coronary artery disease(aborted anterior STEMI s/p PCI to LAD in 2004, NSTEMI 2014 with occluded LCx s/p unsuccessful PCI),prior  VF arrest in the setting of hypokalemia2013, permanent atrial fibrillation, chronic kidney diseasestage III, peripheral arterial disease,AAA (Dr. [name] plans f/u US 2022),mitral regurgitation, and COPDwho is beingfollowed by cardiologyfor the evaluation of decompensatedcongestive heart failureand syncope.   Assessment & Plan    1.  Acute on chronic systolic dysfunction/ ischemic CM Doing well with oral diuresis Continue demadex 60mg  BID at discharge with close outpatient follow-up Holding ACEi/ARB due to renal failure  2. Syncope As per yesterday's note, I do not feel that outpatient monitor would be beneficial as he already meets ICD criteria.  Needs follow-up with Dr Lovena Le (I will send message to scheduling). No driving  3. Permanent afib Rate controlled On eliquis  4. Tobacco  Cessation advised  Ok to discharge from cardiology standpoint with close outpatient followup.  I agree with Dr Sloan Leiter that he may need O2 at home.  Thompson Grayer MD, [facility] 10/06/2019 7:58 AM

## 2019-10-06 NOTE — Progress Notes (Signed)
Received referral to assist with home O2. Spoke with pt to discuss referral and preference for an agency. He reports that he doesn't have a preference. Will contact [facility] and he agrees with agency. Contacted Ashley at [facility] for referral. He accepted the referral.

## 2019-10-06 NOTE — Discharge Instructions (Signed)
Heart Failure Action Plan A heart failure action plan helps you understand what to do when you have symptoms of heart failure. Follow the plan that was created by you and your health care provider. Review your plan each time you visit your health care provider. Red zone These signs and symptoms mean you should get medical help right away:  You have trouble breathing when resting.  You have a dry cough that is getting worse.  You have swelling or pain in your legs or abdomen that is getting worse.  You suddenly gain more than 2-3 lb (0.9-1.4 kg) in a day, or more than 5 lb (2.3 kg) in one week. This amount may be more or less depending on your condition.  You have trouble staying awake or you feel confused.  You have chest pain.  You do not have an appetite.  You pass out. If you experience any of these symptoms:  Call your local emergency services (911 in the [place] right away or seek help at the emergency department of the nearest hospital. Yellow zone These signs and symptoms mean your condition may be getting worse and you should make some changes:  You have trouble breathing when you are active or you need to sleep with extra pillows.  You have swelling in your legs or abdomen.  You gain 2-3 lb (0.9-1.4 kg) in one day, or 5 lb (2.3 kg) in one week. This amount may be more or less depending on your condition.  You get tired easily.  You have trouble sleeping.  You have a dry cough. If you experience any of these symptoms:  Contact your health care provider within the next day.  Your health care provider may adjust your medicines. Green zone These signs mean you are doing well and can continue what you are doing:  You do not have shortness of breath.  You have very little swelling or no new swelling.  Your weight is stable (no gain or loss).  You have a normal activity level.  You do not have chest pain or any other new symptoms. Follow these instructions at  home:  Take over-the-counter and prescription medicines only as told by your health care provider.  Weigh yourself daily. Your target weight is __________ lb (__________ kg). ? Call your health care provider if you gain more than __________ lb (__________ kg) in a day, or more than __________ lb (__________ kg) in one week.  Eat a heart-healthy diet. Work with a diet and nutrition specialist (dietitian) to create an eating plan that is best for you.  Keep all follow-up visits as told by your health care provider. This is important. Where to find more information  American Heart Association: www.heart.org Summary  Follow the action plan that was created by you and your health care provider.  Get help right away if you have any symptoms in the Red zone. This information is not intended to replace advice given to you by your health care provider. Make sure you discuss any questions you have with your health care provider. Document Revised: 08/04/2017 Document Reviewed: 10/01/2016 Elsevier Patient Education  2020 [place].   Heart Failure Eating Plan Heart failure, also called congestive heart failure, occurs when your heart does not pump blood well enough to meet your body's needs for oxygen-rich blood. Heart failure is a long-term (chronic) condition. Living with heart failure can be challenging. However, following your health care provider's instructions about a healthy lifestyle and working with a  diet and nutrition specialist (dietitian) to choose the right foods may help to improve your symptoms. What are tips for following this plan? Reading food labels  Check food labels for the amount of sodium per serving. Choose foods that have less than 140 mg (milligrams) of sodium in each serving.  Check food labels for the number of calories per serving. This is important if you need to limit your daily calorie intake to lose weight.  Check food labels for the serving size. If you eat  more than one serving, you will be eating more sodium and calories than what is listed on the label.  Look for foods that are labeled as "sodium-free," "very low sodium," or "low sodium." ? Foods labeled as "reduced sodium" or "lightly salted" may still have more sodium than what is recommended for you. Cooking  Avoid adding salt when cooking. Ask your health care provider or dietitian before using salt substitutes.  Season food with salt-free seasonings, spices, or herbs. Check the label of seasoning mixes to make sure they do not contain salt.  Cook with heart-healthy oils, such as olive, canola, soybean, or sunflower oil.  Do not fry foods. Cook foods using low-fat methods, such as baking, boiling, grilling, and broiling.  Limit unhealthy fats when cooking by: ? Removing the skin from poultry, such as chicken. ? Removing all visible fats from meats. ? Skimming the fat off from stews, soups, and gravies before serving them. Meal planning   Limit your intake of: ? Processed, canned, or pre-packaged foods. ? Foods that are high in trans fat, such as fried foods. ? Sweets, desserts, sugary drinks, and other foods with added sugar. ? Full-fat dairy products, such as whole milk.  Eat a balanced diet that includes: ? 4-5 servings of fruit each day and 4-5 servings of vegetables each day. At each meal, try to fill half of your plate with fruits and vegetables. ? Up to 6-8 servings of whole grains each day. ? Up to 2 servings of lean meat, poultry, or fish each day. One serving of meat is equal to 3 oz. This is about the same size as a deck of cards. ? 2 servings of low-fat dairy each day. ? Heart-healthy fats. Healthy fats called omega-3 fatty acids are found in foods such as flaxseed and cold-water fish like sardines, salmon, and mackerel.  Aim to eat 25-35 g (grams) of fiber a day. Foods that are high in fiber include apples, broccoli, carrots, beans, peas, and whole grains.  Do not  add salt or condiments that contain salt (such as soy sauce) to foods before eating.  When eating at a restaurant, ask that your food be prepared with less salt or no salt, if possible.  Try to eat 2 or more vegetarian meals each week.  Eat more home-cooked food and eat less restaurant, buffet, and fast food. General information  Do not eat more than 2,300 mg of salt (sodium) a day. The amount of sodium that is recommended for you may be lower, depending on your condition.  Maintain a healthy body weight as directed. Ask your health care provider what a healthy weight is for you. ? Check your weight every day. ? Work with your health care provider and dietitian to make a plan that is right for you to lose weight or maintain your current weight.  Limit how much fluid you drink. Ask your health care provider or dietitian how much fluid you can have each day.  Limit or avoid alcohol as told by your health care provider or dietitian. Recommended foods The items listed may not be a complete list. Talk with your dietitian about what dietary choices are best for you. Fruits All fresh, frozen, and canned fruits. Dried fruits, such as raisins, prunes, and cranberries. Vegetables All fresh vegetables. Vegetables that are frozen without sauce or added salt. Low-sodium or sodium-free canned vegetables. Grains Bread with less than 80 mg of sodium per slice. Whole-wheat pasta, quinoa, and brown rice. Oats and oatmeal. Barley. [place]. Grits and cream of wheat. Whole-grain and whole-wheat cold cereal. Meats and other protein foods Lean cuts of meat. Skinless chicken and [place]. Fish with high omega-3 fatty acids, such as salmon, sardines, and other cold-water fishes. Eggs. Dried beans, peas, and edamame. Unsalted nuts and nut butters. Dairy Low-fat or nonfat (skim) milk and dried milk. Rice milk, soy milk, and almond milk. Low-fat or nonfat yogurt. Small amounts of reduced-sodium block cheese.  Low-sodium cottage cheese. Fats and oils Olive, canola, soybean, flaxseed, or sunflower oil. Avocado. Sweets and desserts Apple sauce. Granola bars. Sugar-free pudding and gelatin. Frozen fruit bars. Seasoning and other foods Fresh and dried herbs. Lemon or lime juice. Vinegar. Low-sodium ketchup. Salt-free marinades, salad dressings, sauces, and seasonings. The items listed above may not be a complete list of foods and beverages you can eat. Contact a dietitian for more information. Foods to avoid The items listed may not be a complete list. Talk with your dietitian about what dietary choices are best for you. Fruits Fruits that are dried with sodium-containing preservatives. Vegetables Canned vegetables. Frozen vegetables with sauce or seasonings. Creamed vegetables. [place] fries. Onion rings. Pickled vegetables and sauerkraut. Grains Bread with more than 80 mg of sodium per slice. Hot or cold cereal with more than 140 mg sodium per serving. Salted pretzels and crackers. Pre-packaged breadcrumbs. Bagels, croissants, and biscuits. Meats and other protein foods Ribs and chicken wings. Bacon, ham, pepperoni, bologna, salami, and packaged luncheon meats. Hot dogs, bratwurst, and sausage. Canned meat. Smoked meat and fish. Salted nuts and seeds. Dairy Whole milk, half-and-half, and cream. Buttermilk. Processed cheese, cheese spreads, and cheese curds. Regular cottage cheese. Feta cheese. Shredded cheese. String cheese. Fats and oils Butter, lard, shortening, ghee, and bacon fat. Canned and packaged gravies. Seasoning and other foods Onion salt, garlic salt, table salt, and sea salt. Marinades. Regular salad dressings. Relishes, pickles, and olives. Meat flavorings and tenderizers, and bouillon cubes. Horseradish, ketchup, and mustard. Worcestershire sauce. Teriyaki sauce, soy sauce (including reduced sodium). Hot sauce and Tabasco sauce. Steak sauce, fish sauce, oyster sauce, and cocktail sauce.  Taco seasonings. Barbecue sauce. T[place]ar sauce. The items listed above may not be a complete list of foods and beverages you should avoid. Contact a dietitian for more information. Summary  A heart failure eating plan includes changes that limit your intake of sodium and unhealthy fat, and it may help you lose weight or maintain a healthy weight. Your health care provider may also recommend limiting how much fluid you drink.  Most people with heart failure should eat no more than 2,300 mg of salt (sodium) a day. The amount of sodium that is recommended for you may be lower, depending on your condition.  Contact your health care provider or dietitian before making any major changes to your diet. This information is not intended to replace advice given to you by your health care provider. Make sure you discuss any questions you have with your health care provider.  Document Revised: 10/18/2018 Document Reviewed: 01/06/2017 Elsevier Patient Education  [place].   Heart Failure, Self Care Heart failure is a serious condition. This document explains the things you need to do to take care of yourself after a heart failure diagnosis. You may be asked to change your diet, take certain medicines, and make other lifestyle changes in order to stay as healthy as possible. Your health care provider may also give you more specific instructions. If you have problems or questions, contact your health care provider. What are the risks? Having heart failure puts you at higher risk for certain problems. These problems can get worse if you do not take good care of yourself. Problems may include:  Blood clotting problems. This may cause a stroke.  Damage to the kidneys, liver, or lungs.  Abnormal heart rhythms. Supplies needed:  Scale for monitoring weight.  Blood pressure monitor.  Notebook.  Medicines. How to care for yourself when you have heart failure Medicines Take over-the-counter and  prescription medicines only as told by your health care provider. Medicines reduce the workload of your heart, slow the progression of heart failure, and improve symptoms. Take your medicines every day.  Do not stop taking your medicine unless your health care provider tells you to do so.  Do not skip any dose of medicine.  Refill your prescriptions before you run out of medicine. Eating and drinking   Eat heart-healthy foods. Talk with a dietitian to make an eating plan that is right for you. ? Choose foods that contain no trans fat and are low in saturated fat and cholesterol. Healthy choices include fresh or frozen fruits and vegetables, fish, lean meats, legumes, fat-free or low-fat dairy products, and whole-grain or high-fiber foods. ? Limit salt (sodium) if told by your health care provider. Sodium restriction may reduce symptoms of heart failure. Ask a dietitian to recommend heart-healthy seasonings. ? Use healthy cooking methods instead of frying. Healthy methods include roasting, grilling, broiling, baking, poaching, steaming, and stir-frying.  Limit your fluid intake, if directed by your health care provider. Fluid restriction may reduce symptoms of heart failure. Alcohol use  Do not drink alcohol if: ? Your health care provider tells you not to drink. ? Your heart was damaged by alcohol, or you have severe heart failure. ? You are pregnant, may be pregnant, or are planning to become pregnant.  If you drink alcohol: ? Limit how much you use to:  0-1 drink a day for women.  0-2 drinks a day for men. ? Be aware of how much alcohol is in your drink. In the [place] one drink equals one 12 oz bottle of beer (355 mL), one 5 oz glass of wine (148 mL), or one 1 oz glass of hard liquor (44 mL). Lifestyle   Do not use any products that contain nicotine or tobacco, such as cigarettes, e-cigarettes, and chewing tobacco. If you need help quitting, ask your health care provider. ? Do not  use nicotine gum or patches before talking to your health care provider.  Do not use illegal drugs.  Work with your health care provider to safely reach the right body weight.  Do physical activity if told by your health care provider. Talk to your health care provider before you begin an exercise if: ? You are an older adult. ? You have severe heart failure.  Learn to manage stress. If you need help to do this, ask your health care provider.  Participate in or  seek rehabilitation as needed to keep or improve your independence and quality of life.  Plan rest periods when you get tired. Monitoring important information   Weigh yourself every day. This will help you to notice if too much fluid is building up in your body. ? Weigh yourself every morning after you urinate and before you eat breakfast. ? Wear the same amount of clothing each time you weigh yourself. ? Record your daily weight. Provide your health care provider with your weight record.  Monitor and record your pulse and blood pressure as told by your health care provider. Dealing with extreme temperatures  If the weather is extremely hot: ? Avoid vigorous physical activity. ? Use air conditioning or fans, or find a cooler location. ? Avoid caffeine and alcohol. ? Wear loose-fitting, lightweight, and light-colored clothing.  If the weather is extremely cold: ? Avoid vigorous activity. ? Layer your clothes. ? Wear mittens or gloves, a hat, and a scarf when you go outside. ? Avoid alcohol. Follow these instructions at home:  Stay up to date with vaccines. Pneumococcal and flu (influenza) vaccines are especially important in preventing infections of the airways.  Keep all follow-up visits as told by your health care provider. This is important. Contact a health care provider if you:  Have a rapid weight gain.  Have increasing shortness of breath.  Are unable to participate in your usual physical  activities.  Get tired easily.  Cough more than normal, especially with physical activity.  Lose your appetite or feel nauseous.  Have any swelling or more swelling in areas such as your hands, feet, ankles, or abdomen.  Are unable to sleep because it is hard to breathe.  Feel like your heart is beating quickly (palpitations).  Become dizzy or light-headed when you stand up. Get help right away if you:  Have trouble breathing.  Notice or your family notices a change in your awareness, such as having trouble staying awake or concentrating.  Have pain or discomfort in your chest.  Have an episode of fainting (syncope). These symptoms may represent a serious problem that is an emergency. Do not wait to see if the symptoms will go away. Get medical help right away. Call your local emergency services (911 in the [place] Do not drive yourself to the hospital. Summary  Heart failure is a serious condition. To care for yourself, you may be asked to change your diet, take certain medicines, and make other lifestyle changes.  Take your medicines every day. Do not stop taking them unless your health care provider tells you to do so.  Eat heart-healthy foods, such as fresh or frozen fruits and vegetables, fish, lean meats, legumes, fat-free or low-fat dairy products, and whole-grain or high-fiber foods.  Ask your health care provider if you have any alcohol restrictions. You may have to stop drinking alcohol if you have severe heart failure.  Contact your health care provider if you notice problems, such as rapid weight gain or a fast heartbeat. Get help right away if you faint, or have chest pain or trouble breathing. This information is not intended to replace advice given to you by your health care provider. Make sure you discuss any questions you have with your health care provider. Document Revised: 12/04/2018 Document Reviewed: 12/05/2018 Elsevier Patient Education  [place].   Heart Failure, Self Care Heart failure is a serious condition. This sheet explains things you need to do to take care of yourself at home.  To help you stay as healthy as possible, you may be asked to change your diet, take certain medicines, and make other changes in your life. Your doctor may also give you more specific instructions. If you have problems or questions, call your doctor. What are the risks? Having heart failure makes it more likely for you to have some problems. These problems can get worse if you do not take good care of yourself. Problems may include:  Blood clotting problems. This may cause a stroke.  Damage to the kidneys, liver, or lungs.  Abnormal heart rhythms. Supplies needed:  Scale for weighing yourself.  Blood pressure monitor.  Notebook.  Medicines. How to care for yourself when you have heart failure Medicines Take over-the-counter and prescription medicines only as told by your doctor. Take your medicines every day.  Do not stop taking your medicine unless your doctor tells you to do so.  Do not skip any medicines.  Get your prescriptions refilled before you run out of medicine. This is important. Eating and drinking   Eat heart-healthy foods. Talk with a diet specialist (dietitian) to create an eating plan.  Choose foods that: ? Have no trans fat. ? Are low in saturated fat and cholesterol.  Choose healthy foods, such as: ? Fresh or frozen fruits and vegetables. ? Fish. ? Low-fat (lean) meats. ? Legumes, such as beans, peas, and lentils. ? Fat-free or low-fat dairy products. ? Whole-grain foods. ? High-fiber foods.  Limit salt (sodium) if told by your doctor. Ask your diet specialist to tell you which seasonings are healthy for your heart.  Cook in healthy ways instead of frying. Healthy ways of cooking include roasting, grilling, broiling, baking, poaching, steaming, and stir-frying.  Limit how much fluid you drink, if told  by your doctor. Alcohol use  Do not drink alcohol if: ? Your doctor tells you not to drink. ? Your heart was damaged by alcohol, or you have very bad heart failure. ? You are pregnant, may be pregnant, or are planning to become pregnant.  If you drink alcohol: ? Limit how much you use to:  0-1 drink a day for women.  0-2 drinks a day for men. ? Be aware of how much alcohol is in your drink. In the [place] one drink equals one 12 oz bottle of beer (355 mL), one 5 oz glass of wine (148 mL), or one 1 oz glass of hard liquor (44 mL). Lifestyle   Do not use any products that contain nicotine or tobacco, such as cigarettes, e-cigarettes, and chewing tobacco. If you need help quitting, ask your doctor. ? Do not use nicotine gum or patches before talking to your doctor.  Do not use illegal drugs.  Lose weight if told by your doctor.  Do physical activity if told by your doctor. Talk to your doctor before you begin an exercise if: ? You are an older adult. ? You have very bad heart failure.  Learn to manage stress. If you need help, ask your doctor.  Get rehab (rehabilitation) to help you stay independent and to help with your quality of life.  Plan time to rest when you get tired. Check weight and blood pressure   Weigh yourself every day. This will help you to know if fluid is building up in your body. ? Weigh yourself every morning after you pee (urinate) and before you eat breakfast. ? Wear the same amount of clothing each time. ? Write down your daily weight. Give  your record to your doctor.  Check and write down your blood pressure as told by your doctor.  Check your pulse as told by your doctor. Dealing with very hot and very cold weather  If it is very hot: ? Avoid activities that take a lot of energy. ? Use air conditioning or fans, or find a cooler place. ? Avoid caffeine and alcohol. ? Wear clothing that is loose-fitting, lightweight, and light-colored.  If it is  very cold: ? Avoid activities that take a lot of energy. ? Layer your clothes. ? Wear mittens or gloves, a hat, and a scarf when you go outside. ? Avoid alcohol. Follow these instructions at home:  Stay up to date with shots (vaccines). Get pneumococcal and flu (influenza) shots.  Keep all follow-up visits as told by your doctor. This is important. Contact a doctor if:  You gain weight quickly.  You have increasing shortness of breath.  You cannot do your normal activities.  You get tired easily.  You cough a lot.  You don't feel like eating or feel like you may vomit (nauseous).  You become puffy (swell) in your hands, feet, ankles, or belly (abdomen).  You cannot sleep well because it is hard to breathe.  You feel like your heart is beating fast (palpitations).  You get dizzy when you stand up. Get help right away if:  You have trouble breathing.  You or someone else notices a change in your behavior, such as having trouble staying awake.  You have chest pain or discomfort.  You pass out (faint). These symptoms may be an emergency. Do not wait to see if the symptoms will go away. Get medical help right away. Call your local emergency services (911 in the [place] Do not drive yourself to the hospital. Summary  Heart failure is a serious condition. To care for yourself, you may have to change your diet, take medicines, and make other lifestyle changes.  Take your medicines every day. Do not stop taking them unless your doctor tells you to do so.  Eat heart-healthy foods, such as fresh or frozen fruits and vegetables, fish, lean meats, legumes, fat-free or low-fat dairy products, and whole-grain or high-fiber foods.  Ask your doctor if you can drink alcohol. You may have to stop alcohol use if you have very bad heart failure.  Contact your doctor if you gain weight quickly or feel that your heart is beating too fast. Get help right away if you pass out, or have chest  pain or trouble breathing. This information is not intended to replace advice given to you by your health care provider. Make sure you discuss any questions you have with your health care provider. Document Revised: 12/04/2018 Document Reviewed: 12/05/2018 Elsevier Patient Education  [place].

## 2019-10-06 NOTE — Discharge Summary (Addendum)
Discharge Summary  Logan White VQQ:[id number] DOB: Dec 04, 1940  PCP: No primary care provider on file.  Admit date: 09/28/2019 Discharge date: 10/06/2019  Time spent: 35 minutes  Recommendations for Outpatient Follow-up:  1. Follow-up with cardiology 2. Follow-up with your primary care provider 3. Take your medications as prescribed 4. Do not drive for at least 6 months or until cleared by cardiology.  Discharge Diagnoses:  Active Hospital Problems   Diagnosis Date Noted  . CHF (congestive heart failure), NYHA class IV, acute, systolic ([place]) 43/32/9518  . VF (ventricular fibrillation) ([place]) 12/31/2012    Resolved Hospital Problems  No resolved problems to display.    Discharge Condition: Stable  Diet recommendation: Heart healthy diet.  Vitals:   10/06/19 0526 10/06/19 0804  BP: 107/62   Pulse: 63   Resp: 20   Temp: 98.3 F (36.8 C)   SpO2: 91% 95%    History of present illness:  79 year old gentleman with extensive cardiac history including chronic systolic congestive heart failure with known ejection fraction of 30%, chronic A. fib on anticoagulation with Eliquis, previous V. fib arrest in 2014, ischemic cardiomyopathy, coronary artery disease, peripheral vascular disease and underlying COPD.  He is managed outpatient, recently increased dose of torsemide, despite that continued to have significant exertional dyspnea and orthopnea.  On the day of admission, he had an episode of severe shortness of breath on walking to the bathroom and near syncopal episode so EMS was called and he was brought to ER. In the emergency room 87% on room air.  Admitted for treatment with heart failure.  10/06/19: Seen and examined.  No acute events overnight.  Has no new complaints.  Okay to discharge per cardiology.  Patient is to go home.  Advised to keep follow-up appointment with cardiology.  Patient understands and agrees to plan.  Hospital Course:  Principal Problem:   CHF  (congestive heart failure), NYHA class IV, acute, systolic (H[facility]) Active Problems:   VF (ventricular fibrillation) (H[facility])  Acute hypoxic respiratory failure:  acute on chronic systolic congestive heart failure: Good clinical improvement with high-dose diuretics.  Continue Demadex and cardiac meds as rec by cardiology. Net I&O -15.5 L. Echocardiogram with ejection fraction 25%.    Seen by cardiology.  Follow up with cards outpatient. Maintain O2 saturation greater than 92%.  Home O2 evaluation prior to discharge.  Near syncope, suspected cardiac etiology Will follow with cardiology outpatient for possible ICD placement. Per cardiology Dr. Rayann Heman; " I do not feel that he would benefit from 30 day monitor as he already meets indication for an ICD once he is clinically improved.   No driving x 6 months Will need follow-up with arranged with Dr Lovena Le who has seen him previously for consideration of an ICD"  Resolved hyperkalemia on presentation:  Potassium 3.5 and magnesium 2.0 on 10/06/2019. Continue p.o. KCl supplement while on diuretics.  AKI on chronic kidney disease stage IIIB: historical creatinine 1.3-1.4. Worsening serum creatinine probably due to acute congestive heart failure.  Renal ultrasound with no obstruction or hydronephrosis.  Medical renal disease. Adequate urine output.  Net I&O -15.5 L. Repeat BMP at the clinic on Friday 10/11/19.  Chronic A. fib: Rate controlled on Toprol XL.  Was on heparin for procedure, back on Eliquis for primary CVA prevention.  Hypertension: Blood pressures fairly stable. Last BP 107/62 HR 63 RR 20 .    Chronic bronchitis with acute exacerbation: Continue bronchodilator therapy, complete 5 days prednisone 10 mg daily.   Code  Status: Full code   Consultants:   Cardiology  Procedures:   None  Antimicrobials:   None   Discharge Exam: BP 107/62 (BP Location: Right Arm)   Pulse 63   Temp 98.3 F (36.8 C) (Oral)   Resp 20    Ht 6' 3.5" (1.918 m)   Wt 74.8 kg   SpO2 95%   BMI 20.34 kg/m  . General: 79 y.o. year-old male well developed well nourished in no acute distress.  Alert and oriented x3. . Cardiovascular: Regular rate and rhythm with no rubs or gallops.  No thyromegaly or JVD noted.   Marland Kitchen Respiratory: Mild rales at bases. Good inspiratory effort. . Abdomen: Soft nontender nondistended with normal bowel sounds x4 quadrants. . Musculoskeletal: Trace lower extremity edema.  Marland Kitchen Psychiatry: Mood is appropriate for condition and setting  Discharge Instructions You were cared for by a hospitalist during your hospital stay. If you have any questions about your discharge medications or the care you received while you were in the hospital after you are discharged, you can call the unit and asked to speak with the hospitalist on call if the hospitalist that took care of you is not available. Once you are discharged, your primary care physician will handle any further medical issues. Please note that NO REFILLS for any discharge medications will be authorized once you are discharged, as it is imperative that you return to your primary care physician (or establish a relationship with a primary care physician if you do not have one) for your aftercare needs so that they can reassess your need for medications and monitor your lab values.  Discharge Instructions    Amb Referral to HF Clinic   Complete by: As directed      Allergies as of 10/06/2019   No Known Allergies     Medication List    STOP taking these medications   isosorbide mononitrate 30 MG 24 hr tablet Commonly known as: IMDUR     TAKE these medications   acetaminophen 325 MG tablet Commonly known as: TYLENOL Take 650 mg by mouth every 6 (six) hours as needed for headache.   albuterol 108 (90 Base) MCG/ACT inhaler Commonly known as: VENTOLIN HFA Inhale 2 puffs into the lungs every 6 (six) hours as needed for wheezing or shortness of breath.    apixaban 5 MG Tabs tablet Commonly known as: Eliquis Take 1 tablet (5 mg total) by mouth 2 (two) times daily.   aspirin 81 MG EC tablet Take 1 tablet (81 mg total) by mouth daily. Start taking on: October 07, 2019   atorvastatin 80 MG tablet Commonly known as: LIPITOR Take 80 mg by mouth daily.   Ensure Take 237 mLs by mouth 2 (two) times daily as needed (nutritional supplement).   Fluticasone-Salmeterol 250-50 MCG/DOSE Aepb Commonly known as: Advair Diskus Inhale 1 puff into the lungs 2 (two) times daily for 14 days.   hydrALAZINE 10 MG tablet Commonly known as: APRESOLINE Take 1 tablet (10 mg total) by mouth 2 (two) times daily.   isosorbide dinitrate 20 MG tablet Commonly known as: ISORDIL Take 1 tablet (20 mg total) by mouth 3 (three) times daily.   metoprolol succinate 50 MG 24 hr tablet Commonly known as: TOPROL-XL Take 3 tablets (150 mg total) by mouth daily. Take with or immediately following a meal. Start taking on: October 07, 2019 What changed:   medication strength  how much to take  additional instructions   nitroGLYCERIN 0.4 MG SL  tablet Commonly known as: Nitrostat Place 1 tablet (0.4 mg total) under the tongue every 5 (five) minutes as needed for chest pain (Up to 3 doses).   potassium chloride SA 20 MEQ tablet Commonly known as: KLOR-CON Take 1 tablet (20 mEq total) by mouth 2 (two) times daily.   predniSONE 10 MG tablet Commonly known as: DELTASONE Take 1 tablet (10 mg total) by mouth daily with breakfast for 5 days. Start taking on: October 07, 2019   torsemide 20 MG tablet Commonly known as: DEMADEX Take 3 tablets (60 mg total) by mouth 2 (two) times daily. What changed:   how much to take  when to take this            Durable Medical Equipment  (From admission, onward)         Start     Ordered   10/06/19 1045  For home use only DME oxygen  Once    Question Answer Comment  Length of Need 6 Months   Mode or (Route) Nasal  cannula   Liters per Minute 2   Frequency Continuous (stationary and portable oxygen unit needed)   Oxygen conserving device Yes   Oxygen delivery system Gas      10/06/19 1044         No Known Allergies Follow-up Information    primary care physician. Schedule an appointment as soon as possible for a visit.        Almyra Deforest, [place] Follow up.   Specialties: Cardiology, Radiology Why: [facility] - keep follow-up appointment as scheduled on 10/09/19 at 2:15pm. Please arrive 15 minutes early to check in. [name] is one of the PAs that you have met before that works with Dr. [name]. Contact information: [address] [place] [employer] [address] [phone]        Lelon Perla, MD .   Specialty: Cardiology Contact information: [address] [place] [address] [phone]            The results of significant diagnostics from this hospitalization (including imaging, microbiology, ancillary and laboratory) are listed below for reference.    Significant Diagnostic Studies: CT Head Wo Contrast  Result Date: 09/28/2019 CLINICAL DATA:  Pain status post fall. EXAM: CT HEAD WITHOUT CONTRAST CT CERVICAL SPINE WITHOUT CONTRAST TECHNIQUE: Multidetector CT imaging of the head and cervical spine was performed following the standard protocol without intravenous contrast. Multiplanar CT image reconstructions of the cervical spine were also generated. COMPARISON:  CT head dated 01/02/2013.  MRI dated Jan 04, 2013. FINDINGS: CT HEAD FINDINGS Brain: No evidence of acute infarction, hemorrhage, hydrocephalus, extra-axial collection or mass lesion/mass effect. Mild age related atrophy is noted. Vascular: No hyperdense vessel or unexpected calcification. Skull: Normal. Negative for fracture or focal lesion. Sinuses/Orbits: No acute finding. Other: None. CT CERVICAL SPINE FINDINGS Alignment: Normal. Skull base and vertebrae: There is no acute displaced fracture. No  dislocation. Is height loss of several cervical vertebral bodies, most evident at the C4-C5 levels, likely chronic. Soft tissues and spinal canal: No prevertebral fluid or swelling. No visible canal hematoma. Disc levels:  The disc heights are relatively well preserved. Upper chest: Negative. Other: None IMPRESSION: 1. No acute intracranial abnormality. 2. No acute cervical spine fracture. Electronically Signed   By: Constance Holster M.D.   On: 09/28/2019 19:19   CT Cervical Spine Wo Contrast  Result Date: 09/28/2019 CLINICAL DATA:  Pain status post fall. EXAM: CT HEAD WITHOUT CONTRAST CT CERVICAL  SPINE WITHOUT CONTRAST TECHNIQUE: Multidetector CT imaging of the head and cervical spine was performed following the standard protocol without intravenous contrast. Multiplanar CT image reconstructions of the cervical spine were also generated. COMPARISON:  CT head dated 01/02/2013.  MRI dated Jan 04, 2013. FINDINGS: CT HEAD FINDINGS Brain: No evidence of acute infarction, hemorrhage, hydrocephalus, extra-axial collection or mass lesion/mass effect. Mild age related atrophy is noted. Vascular: No hyperdense vessel or unexpected calcification. Skull: Normal. Negative for fracture or focal lesion. Sinuses/Orbits: No acute finding. Other: None. CT CERVICAL SPINE FINDINGS Alignment: Normal. Skull base and vertebrae: There is no acute displaced fracture. No dislocation. Is height loss of several cervical vertebral bodies, most evident at the C4-C5 levels, likely chronic. Soft tissues and spinal canal: No prevertebral fluid or swelling. No visible canal hematoma. Disc levels:  The disc heights are relatively well preserved. Upper chest: Negative. Other: None IMPRESSION: 1. No acute intracranial abnormality. 2. No acute cervical spine fracture. Electronically Signed   By: Constance Holster M.D.   On: 09/28/2019 19:19   CARDIAC CATHETERIZATION  Result Date: 10/02/2019  Moderate pulmonary hypertension with mean PA  pressure of 42 mmHg.  Mean pulmonary capillary wedge pressure 28 mmHg.  Phasic pulmonary artery pressure 63/29 mmHg, mean RA pressure 20 mmHg, and pulmonary artery pulsatility index is 1.7 (suggesting RV dysfunction)  Mixed venous O2 saturation 57%  US RENAL  Result Date: 09/30/2019 CLINICAL DATA:  Acute on chronic renal failure EXAM: RENAL / URINARY TRACT ULTRASOUND COMPLETE COMPARISON:  11/09/2017 FINDINGS: Right Kidney: Renal measurements: 9.6 x 3.9 x 4.6 cm = volume: 90.6 mL. Cortex is echogenic. No hydronephrosis or mass Left Kidney: Renal measurements: 11.5 x 5.3 x 5.6 cm = volume: 177.5 mL. Cortex is echogenic. No hydronephrosis or mass Bladder: Appears normal for degree of bladder distention. Other: Incidental note made of small amount of ascites and right pleural effusion. Possible contour nodularity of the liver. IMPRESSION: 1. Echogenic kidneys consistent with medical renal disease. No hydronephrosis 2. Small amount of ascites and small right pleural effusion 3. Possible contour nodularity of the liver as may be seen with cirrhosis. Electronically Signed   By: Donavan Foil M.D.   On: 09/30/2019 20:18   DG Chest Port 1 View  Result Date: 09/28/2019 CLINICAL DATA:  Shortness of breath EXAM: PORTABLE CHEST 1 VIEW COMPARISON:  06/22/2019 FINDINGS: Heart size is significantly enlarged. The pulmonary arteries are dilated. Prominent interstitial lung markings. Aortic calcifications are noted. There are trace to small bilateral pleural effusions. There is no pneumothorax. IMPRESSION: 1. Cardiomegaly with findings concerning for congestive heart failure. 2. Dilated pulmonary arteries which can be seen in patients with elevated pulmonary artery pressure. 3.  Choose Electronically Signed   By: Constance Holster M.D.   On: 09/28/2019 18:34   ECHOCARDIOGRAM COMPLETE  Result Date: 09/29/2019   ECHOCARDIOGRAM REPORT   Patient Name:   JAMICHEAL HEARD Date of Exam: 09/29/2019 Medical Rec #:  [id number]          Height:       75.5 in Accession #:    [id number]        Weight:       178.2 lb Date of Birth:  February 26, 1941         BSA:          2.10 m Patient Age:    15 years          BP:           117/92 mmHg  Patient Gender: M                 HR:           94 bpm. Exam Location:  Inpatient Procedure: 2D Echo Indications:    Cardiomyopathy-Unsepcified  History:        Patient has no prior history of Echocardiogram examinations and                 Patient has prior history of Echocardiogram examinations, most                 recent 08/08/2019. CHF, Arrythmias:Atrial Fibrillation,                 Signs/Symptoms:Shortness of Breath; Risk Factors:Former Smoker.                 Ischemic cardiomyopathy. CKD.  Sonographer:    Clayton Lefort RDCS (AE) Referring Phys: [id number] [place]  Sonographer Comments: Image acquisition challenging due to respiratory motion. IMPRESSIONS  1. Left ventricular ejection fraction, by visual estimation, is 25 to 30%. The left ventricle has severely decreased function. There is mildly increased left ventricular hypertrophy.  2. Left ventricular diastolic function could not be evaluated.  3. Severely dilated left ventricular internal cavity size.  4. The left ventricle demonstrates global hypokinesis.  5. Global right ventricle has severely reduced systolic function.The right ventricular size is mildly enlarged.  6. Left atrial size was severely dilated.  7. The interatrial septum is aneurysmal.  8. Right atrial size was severely dilated.  9. The mitral valve is normal in structure. Moderate mitral valve regurgitation. No evidence of mitral stenosis. 10. The tricuspid valve is normal in structure. Tricuspid valve regurgitation is mild. 11. The aortic valve is tricuspid. Aortic valve regurgitation is mild. Mild to moderate aortic valve sclerosis/calcification without any evidence of aortic stenosis. 12. The pulmonic valve was normal in structure. Pulmonic valve regurgitation is mild. 13. Moderately  elevated pulmonary artery systolic pressure. 14. The inferior vena cava is dilated in size with <50% respiratory variability, suggesting right atrial pressure of 15 mmHg. 15. Severe global reduction in LV systolic function; mild LVH; 4 chamber enlargement; severe RV dysfunction; mild AI; moderate MR; mild TR; moderate pulmonary hypertension. FINDINGS  Left Ventricle: Left ventricular ejection fraction, by visual estimation, is 25 to 30%. The left ventricle has severely decreased function. The left ventricle demonstrates global hypokinesis. The left ventricular internal cavity size was severely dilated left ventricle. There is mildly increased left ventricular hypertrophy. The left ventricular diastology could not be evaluated due to atrial fibrillation. Left ventricular diastolic function could not be evaluated. Right Ventricle: The right ventricular size is mildly enlarged.Global RV systolic function is has severely reduced systolic function. The tricuspid regurgitant velocity is 2.66 m/s, and with an assumed right atrial pressure of 15 mmHg, the estimated right ventricular systolic pressure is moderately elevated at 43.3 mmHg. Left Atrium: Left atrial size was severely dilated. Right Atrium: Right atrial size was severely dilated Pericardium: There is no evidence of pericardial effusion. Mitral Valve: The mitral valve is normal in structure. There is mild thickening of the mitral valve leaflet(s). Moderate mitral valve regurgitation. No evidence of mitral valve stenosis by observation. Tricuspid Valve: The tricuspid valve is normal in structure. Tricuspid valve regurgitation is mild. Aortic Valve: The aortic valve is tricuspid. Aortic valve regurgitation is mild. Aortic regurgitation PHT measures 643 msec. Mild to moderate aortic valve sclerosis/calcification is present, without any evidence of aortic stenosis.  Pulmonic Valve: The pulmonic valve was normal in structure. Pulmonic valve regurgitation is mild.  Pulmonic regurgitation is mild. Aorta: The aortic root is normal in size and structure. Venous: The inferior vena cava is dilated in size with less than 50% respiratory variability, suggesting right atrial pressure of 15 mmHg. IAS/Shunts: An The interatrial septum is aneurysmal is seen. Additional Comments: Severe global reduction in LV systolic function; mild LVH; 4 chamber enlargement; severe RV dysfunction; mild AI; moderate MR; mild TR; moderate pulmonary hypertension.  LEFT VENTRICLE PLAX 2D LVIDd:         6.50 cm LVIDs:         6.00 cm LV PW:         1.10 cm LV IVS:        1.20 cm LVOT diam:     2.00 cm  3D Volume EF: LV SV:         36 ml    3D EF:        34 % LV SV Index:   17.40    LV EDV:       233 ml LVOT Area:     3.14 cm LV ESV:       155 ml                         LV SV:        78 ml RIGHT VENTRICLE            IVC RV Basal diam:  4.10 cm    IVC diam: 2.60 cm RV S prime:     9.00 cm/s TAPSE (M-mode): 1.6 cm LEFT ATRIUM              Index        RIGHT ATRIUM           Index LA diam:        6.70 cm  3.19 cm/m   RA Area:     35.80 cm LA Vol (A2C):   261.0 ml 124.30 ml/m RA Volume:   128.00 ml 60.96 ml/m LA Vol (A4C):   272.0 ml 129.54 ml/m LA Biplane Vol: 267.0 ml 127.16 ml/m  AORTIC VALVE AI PHT:      643 msec  AORTA Ao Root diam: 2.90 cm Ao Asc diam:  3.20 cm MR Peak grad:    102.4 mmHg  TRICUSPID VALVE MR Mean grad:    58.0 mmHg   TR Peak grad:   28.3 mmHg MR Vmax:         506.00 cm/s TR Vmax:        328.00 cm/s MR Vmean:        350.0 cm/s MR PISA:         5.09 cm    SHUNTS MR PISA Eff ROA: 31 mm      Systemic Diam: 2.00 cm MR PISA Radius:  0.90 cm  Kirk Ruths MD Electronically signed by Kirk Ruths MD Signature Date/Time: 09/29/2019/10:17:51 AM    Final     Microbiology: Recent Results (from the past 240 hour(s))  Respiratory Panel by RT PCR (Flu A&B, Covid) - Nasopharyngeal Swab     Status: None   Collection Time: 09/28/19  6:29 PM   Specimen: Nasopharyngeal Swab  Result Value  Ref Range Status   SARS Coronavirus 2 by RT PCR NEGATIVE NEGATIVE Final    Comment: (NOTE) SARS-CoV-2 target nucleic acids are NOT DETECTED. The SARS-CoV-2 RNA is generally detectable  in upper respiratoy specimens during the acute phase of infection. The lowest concentration of SARS-CoV-2 viral copies this assay can detect is 131 copies/mL. A negative result does not preclude SARS-Cov-2 infection and should not be used as the sole basis for treatment or other patient management decisions. A negative result may occur with  improper specimen collection/handling, submission of specimen other than nasopharyngeal swab, presence of viral mutation(s) within the areas targeted by this assay, and inadequate number of viral copies (<131 copies/mL). A negative result must be combined with clinical observations, patient history, and epidemiological information. The expected result is Negative. Fact Sheet for Patients:  PinkCheek.be Fact Sheet for Healthcare Providers:  GravelBags.it This test is not yet ap proved or cleared by the [place] FDA and  has been authorized for detection and/or diagnosis of SARS-CoV-2 by FDA under an Emergency Use Authorization (EUA). This EUA will remain  in effect (meaning this test can be used) for the duration of the COVID-19 declaration under Section 564(b)(1) of the Act, 21 U.S.C. section 360bbb-3(b)(1), unless the authorization is terminated or revoked sooner.    Influenza A by PCR NEGATIVE NEGATIVE Final   Influenza B by PCR NEGATIVE NEGATIVE Final    Comment: (NOTE) The Xpert Xpress SARS-CoV-2/FLU/RSV assay is intended as an aid in  the diagnosis of influenza from Nasopharyngeal swab specimens and  should not be used as a sole basis for treatment. Nasal washings and  aspirates are unacceptable for Xpert Xpress SARS-CoV-2/FLU/RSV  testing. Fact Sheet for  Patients: PinkCheek.be Fact Sheet for Healthcare Providers: GravelBags.it This test is not yet approved or cleared by the [place] FDA and  has been authorized for detection and/or diagnosis of SARS-CoV-2 by  FDA under an Emergency Use Authorization (EUA). This EUA will remain  in effect (meaning this test can be used) for the duration of the  Covid-19 declaration under Section 564(b)(1) of the Act, 21  U.S.C. section 360bbb-3(b)(1), unless the authorization is  terminated or revoked. Performed at [facility], [place] [address], [place], [place] [address]      Labs: Basic Metabolic Panel: Recent Labs  Lab 09/30/19 0455 10/01/19 0557 10/03/19 0427 10/03/19 1138 10/04/19 0451 10/05/19 0447 10/06/19 0447  NA 133*   < > 134* 135 135 134* 132*  K 3.9   < > 3.1* 3.5 3.6 3.2* 3.5  CL 98   < > 92* 94* 91* 84* 80*  CO2 25   < > '30 30 30 ' 36* 40*  GLUCOSE 174*   < > 177* 158* 176* 151* 129*  BUN 51*   < > 96* 96* 96* 94* 94*  CREATININE 2.48*   < > 2.26* 2.04* 2.08* 1.87* 1.92*  CALCIUM 8.6*   < > 9.2 8.9 9.1 9.3 9.2  MG 2.3  --  2.3  --  2.3 2.1 2.0  PHOS 5.4*  --   --   --   --   --   --    < > = values in this interval not displayed.   Liver Function Tests: No results for input(s): AST, ALT, ALKPHOS, BILITOT, PROT, ALBUMIN in the last 168 hours. No results for input(s): LIPASE, AMYLASE in the last 168 hours. No results for input(s): AMMONIA in the last 168 hours. CBC: Recent Labs  Lab 10/01/19 0557 10/01/19 0557 10/02/19 0324 10/02/19 1323 10/04/19 0451 10/05/19 1550 10/06/19 0447  WBC 8.4  --  10.1  --  8.9  --  15.0*  NEUTROABS  --   --   --   --   --   --  11.6*  HGB 12.2*   < > 11.9* 13.3  12.9* 11.4* 11.5* 12.8*  HCT 34.8*   < > 33.6* 39.0  38.0* 31.5* 32.8* 36.6*  MCV 74.8*  --  75.0*  --  74.5*  --  75.2*  PLT 117*  --  149*  --  142*  --  175   < > = values in this interval not  displayed.   Cardiac Enzymes: No results for input(s): CKTOTAL, CKMB, CKMBINDEX, TROPONINI in the last 168 hours. BNP: BNP (last 3 results) Recent Labs    06/22/19 1844 09/28/19 1829  BNP 1,512.4* 2,423.4*    ProBNP (last 3 results) Recent Labs    08/15/19 0949  PROBNP 16,511*    CBG: Recent Labs  Lab 10/02/19 1351  GLUCAP 128*       Signed:  Kayleen Memos, MD [facility] 10/06/2019, 12:25 PM

## 2019-10-06 NOTE — Progress Notes (Signed)
SATURATION QUALIFICATIONS: (This note is used to comply with regulatory documentation for home oxygen)  Patient Saturations on Room Air at Rest = 96%  Patient Saturations on Room Air while Ambulating = 84%  Patient Saturations on 3 Liters of oxygen while Ambulating = 95%  Please briefly explain why patient needs home oxygen: Pt O2 drops to 84% with activity.

## 2019-10-07 NOTE — Telephone Encounter (Signed)
[id number] fot TOC call

## 2019-10-09 ENCOUNTER — Encounter: Payer: Self-pay | Admitting: Physician Assistant

## 2019-10-09 ENCOUNTER — Ambulatory Visit (INDEPENDENT_AMBULATORY_CARE_PROVIDER_SITE_OTHER): Payer: Medicare HMO | Admitting: Physician Assistant

## 2019-10-09 ENCOUNTER — Other Ambulatory Visit: Payer: Self-pay

## 2019-10-09 VITALS — BP 107/61 | HR 75 | Temp 97.2°F | Ht 75.0 in | Wt 159.4 lb

## 2019-10-09 DIAGNOSIS — I5021 Acute systolic (congestive) heart failure: Secondary | ICD-10-CM

## 2019-10-09 DIAGNOSIS — E876 Hypokalemia: Secondary | ICD-10-CM | POA: Diagnosis not present

## 2019-10-09 DIAGNOSIS — I10 Essential (primary) hypertension: Secondary | ICD-10-CM

## 2019-10-09 DIAGNOSIS — I2510 Atherosclerotic heart disease of native coronary artery without angina pectoris: Secondary | ICD-10-CM | POA: Diagnosis not present

## 2019-10-09 DIAGNOSIS — I4819 Other persistent atrial fibrillation: Secondary | ICD-10-CM

## 2019-10-09 NOTE — Patient Instructions (Signed)
Medication Instructions:  Your physician recommends that you continue on your current medications as directed. Please refer to the Current Medication list given to you today.  *If you need a refill on your cardiac medications before your next appointment, please call your pharmacy*  Lab Work: Your physician recommends that you return for lab work on Friday (10/11/19). You do not need an appointment and it is NOT fasting. The lab is open from 8 AM to 4 PM and is closed for lunch from 12:45 PM-1:45 PM.  If you have labs (blood work) drawn today and your tests are completely normal, you will receive your results only by: Marland Kitchen MyChart Message (if you have MyChart) OR . A paper copy in the mail If you have any lab test that is abnormal or we need to change your treatment, we will call you to review the results.   Follow-Up: At [facility], you and your health needs are our priority.  As part of our continuing mission to provide you with exceptional heart care, we have created designated Provider Care Teams.  These Care Teams include your primary Cardiologist (physician) and Advanced Practice Providers (APPs -  Physician Assistants and Nurse Practitioners) who all work together to provide you with the care you need, when you need it.  Your next appointment:   2 week(s)  The format for your next appointment:   In Person  Provider:   Almyra Deforest, PA  Other Instructions  Please purchase a Pulse Oximeter to check your oxygen levels. (SP02)  Your physician recommends that you weigh, daily, at the same time every day, and in the same amount of clothing. Please record your daily weights on the handout provided and bring it to your next appointment.  Limit your fluid intake to 32-64 oz. Daily.   Low-Sodium Eating Plan Sodium, which is an element that makes up salt, helps you maintain a healthy balance of fluids in your body. Too much sodium can increase your blood pressure and cause fluid and waste  to be held in your body. Your health care provider or dietitian may recommend following this plan if you have high blood pressure (hypertension), kidney disease, liver disease, or heart failure. Eating less sodium can help lower your blood pressure, reduce swelling, and protect your heart, liver, and kidneys. What are tips for following this plan? General guidelines  Most people on this plan should limit their sodium intake to 1,500-2,000 mg (milligrams) of sodium each day. Reading food labels   The Nutrition Facts label lists the amount of sodium in one serving of the food. If you eat more than one serving, you must multiply the listed amount of sodium by the number of servings.  Choose foods with less than 140 mg of sodium per serving.  Avoid foods with 300 mg of sodium or more per serving. Shopping  Look for lower-sodium products, often labeled as "low-sodium" or "no salt added."  Always check the sodium content even if foods are labeled as "unsalted" or "no salt added".  Buy fresh foods. ? Avoid canned foods and premade or frozen meals. ? Avoid canned, cured, or processed meats  Buy breads that have less than 80 mg of sodium per slice. Cooking  Eat more home-cooked food and less restaurant, buffet, and fast food.  Avoid adding salt when cooking. Use salt-free seasonings or herbs instead of table salt or sea salt. Check with your health care provider or pharmacist before using salt substitutes.  Cook with plant-based  oils, such as canola, sunflower, or olive oil. Meal planning  When eating at a restaurant, ask that your food be prepared with less salt or no salt, if possible.  Avoid foods that contain MSG (monosodium glutamate). MSG is sometimes added to [place] food, bouillon, and some canned foods. What foods are recommended? The items listed may not be a complete list. Talk with your dietitian about what dietary choices are best for you. Grains Low-sodium cereals,  including oats, puffed wheat and rice, and shredded wheat. Low-sodium crackers. Unsalted rice. Unsalted pasta. Low-sodium bread. Whole-grain breads and whole-grain pasta. Vegetables Fresh or frozen vegetables. "No salt added" canned vegetables. "No salt added" tomato sauce and paste. Low-sodium or reduced-sodium tomato and vegetable juice. Fruits Fresh, frozen, or canned fruit. Fruit juice. Meats and other protein foods Fresh or frozen (no salt added) meat, poultry, seafood, and fish. Low-sodium canned tuna and salmon. Unsalted nuts. Dried peas, beans, and lentils without added salt. Unsalted canned beans. Eggs. Unsalted nut butters. Dairy Milk. Soy milk. Cheese that is naturally low in sodium, such as ricotta cheese, fresh mozzarella, or Swiss cheese Low-sodium or reduced-sodium cheese. Cream cheese. Yogurt. Fats and oils Unsalted butter. Unsalted margarine with no trans fat. Vegetable oils such as canola or olive oils. Seasonings and other foods Fresh and dried herbs and spices. Salt-free seasonings. Low-sodium mustard and ketchup. Sodium-free salad dressing. Sodium-free light mayonnaise. Fresh or refrigerated horseradish. Lemon juice. Vinegar. Homemade, reduced-sodium, or low-sodium soups. Unsalted popcorn and pretzels. Low-salt or salt-free chips. What foods are not recommended? The items listed may not be a complete list. Talk with your dietitian about what dietary choices are best for you. Grains Instant hot cereals. Bread stuffing, pancake, and biscuit mixes. Croutons. Seasoned rice or pasta mixes. Noodle soup cups. Boxed or frozen macaroni and cheese. Regular salted crackers. Self-rising flour. Vegetables Sauerkraut, pickled vegetables, and relishes. Olives. [place] fries. Onion rings. Regular canned vegetables (not low-sodium or reduced-sodium). Regular canned tomato sauce and paste (not low-sodium or reduced-sodium). Regular tomato and vegetable juice (not low-sodium or reduced-sodium).  Frozen vegetables in sauces. Meats and other protein foods Meat or fish that is salted, canned, smoked, spiced, or pickled. Bacon, ham, sausage, hotdogs, corned beef, chipped beef, packaged lunch meats, salt pork, jerky, pickled herring, anchovies, regular canned tuna, sardines, salted nuts. Dairy Processed cheese and cheese spreads. Cheese curds. Blue cheese. Feta cheese. String cheese. Regular cottage cheese. Buttermilk. Canned milk. Fats and oils Salted butter. Regular margarine. Ghee. Bacon fat. Seasonings and other foods Onion salt, garlic salt, seasoned salt, table salt, and sea salt. Canned and packaged gravies. Worcestershire sauce. Tartar sauce. Barbecue sauce. Teriyaki sauce. Soy sauce, including reduced-sodium. Steak sauce. Fish sauce. Oyster sauce. Cocktail sauce. Horseradish that you find on the shelf. Regular ketchup and mustard. Meat flavorings and tenderizers. Bouillon cubes. Hot sauce and Tabasco sauce. Premade or packaged marinades. Premade or packaged taco seasonings. Relishes. Regular salad dressings. Salsa. Potato and tortilla chips. Corn chips and puffs. Salted popcorn and pretzels. Canned or dried soups. Pizza. Frozen entrees and pot pies. Summary  Eating less sodium can help lower your blood pressure, reduce swelling, and protect your heart, liver, and kidneys.  Most people on this plan should limit their sodium intake to 1,500-2,000 mg (milligrams) of sodium each day.  Canned, boxed, and frozen foods are high in sodium. Restaurant foods, fast foods, and pizza are also very high in sodium. You also get sodium by adding salt to food.  Try to cook at home, eat  more fresh fruits and vegetables, and eat less fast food, canned, processed, or prepared foods. This information is not intended to replace advice given to you by your health care provider. Make sure you discuss any questions you have with your health care provider. Document Revised: 08/04/2017 Document Reviewed:  08/15/2016 Elsevier Patient Education  2020 [employer].

## 2019-10-09 NOTE — Progress Notes (Signed)
Cardiology Office Note:    Date:  10/12/2019   ID:  Logan, White [birth year], MRN [id number]  PCP:  No primary care provider on file.  Cardiologist:  Kirk Ruths, MD  Electrophysiologist:  None   Referring MD: Kelton Pillar, MD   Chief Complaint  Patient presents with   Follow-up    seen for Dr. [name]    History of Present Illness:    Logan White is a 79 y.o. male with a hx of permanent atrial fibrillation, CAD, V. fib arrest, ischemic cardiomyopathy and CHF.  He had a PCI of his LAD in October 2004.  He was admitted in June 2013 for V. fib arrest felt to be related to severe hypokalemia with potassium 2.2. Cardiac catheterization in April 2014 showed patent stent in the LAD with 40% in-stent restenosis, apical LAD had 80% lesion, left circumflex artery was occluded, right coronary artery was small and nondominant. He had attempted at CTO PCI of left circumflex however this was unsuccessful. Echocardiogram in April 2014 showed EF down to 30 to 35%, mild LAE, moderately reduced RV function with mild MR. He was seen by Dr. Lovena Le who felt ICD was not indicated at the time because patient did suffer NSTEMI with positive enzymes. He was discharged with what [place] in January 2017 showed fixed inferior and apical defect with no ischemia. Patient had lower extremity angiography in April 2017 that showed occluded right external iliac and common femoral artery not amenable to PCI and 80% left common femoral artery stenosis, occluded left SFA also not amenable to PCI. He was managed medically. Echocardiogram in March 2018 showed EF 30 to 35%, moderate AI, severe MR, biatrial enlargement. Carotid Doppler obtained in November 2019 showed moderate disease bilaterally, 63-monthfollow-up study was recommended. AAA Doppler in July 2020 showed 3 cm dilatation, 2-year follow-up study was recommended.   He was admitted in November 2020 with lower extremity edema and  shortness of breath.  He was treated with IV Lasix with improvement.  Echocardiogram obtained during this admission showed EF 30 to 35%, severe MR, moderate AI.  He was seen by Dr. RHarrington Challengerwho recommended addition of low-dose Imdur.  Hydralazine and losartan were discontinued.  Creatinine trending up prior to discharge.  He was discharged on 60 mg every other day of Lasix.  This was further increased to 60 mg daily on follow-up due to weight gain and worsening lower extremity edema.  When I saw the patient back in November 2020, he appears to be euvolemic.  I discussed with the patient both ICD implantation and work-up for his severe MR.  The patient did not wish to consider ICD at the time however they were agreeable to proceed with work-up for severe MR.  TEE obtained in December 2020 showed EF 25 to 30%, biatrial enlargement, moderate MR, mild tricuspid regurgitation.  His Lasix was switched to torsemide.  He was last seen by ALevell Julyon 09/23/2019, hydralazine 10 mg twice daily was added to his medical regimen.  He complained of increasing dyspnea on exertion, it was suspected some his breathing issue may be related to underlying COPD therefore he was referred to pulmonology service.  He was readmitted to the hospital on 09/28/2019 due to worsening exertional dyspnea with questionable syncope.  O2 saturation was 87% requiring 3 L nasal cannula.  Creatinine worsened from previous 1.8 to 2.6.  BNP was quite elevated at 2423.  Chest x-ray showed cardiomegaly with findings concerning for congestive heart  failure.  It is difficult to tell his volume status, therefore he underwent a right heart cath on 10/02/2019, this revealed moderate pulmonary hypertension with mean pressure of 42 mmHg, elevated mean pulmonary wedge pressure of 20 mmHg, cardiac output 4.24, cardiac index 2.0, pulmonary artery pulsatility index of 1.7 consistent with right heart failure.  He underwent aggressive IV diuresis and the net removal of 2  L of fluid.  He was eventually discharged on Demadex 60 mg twice daily.  Despite questionable syncope, Dr. Rayann Heman did not recommend heart monitor upon discharge as patient already met ICD criteria.  Therefore he recommended follow-up with Dr. Lovena Le.  He was instructed no driving for 6 months. D/C dry weight 164.9 lbs.   Patient presents to cardiology office visit along with his wife.  He denies any chest pain or worsening dyspnea.  He is on oxygen at home.  I recommended him to obtain a pulse oximeter.  He is weight today is 159 pounds which is 5 pounds lower than his discharge weight.  He is due for basic metabolic panel this Friday.  He has high risk of being readmitted to the hospital, I plan to see the patient back in 2 to 3 weeks.  We discussed daily weight, low-salt diet and fluid restriction between 32 to 64 ounces today.  Otherwise he has follow-up with Dr. [name] in 6 weeks.   Past Medical History:  Diagnosis Date   AAA (abdominal aortic aneurysm) ([place])    a. 08/2015 Abd U/S: 2.7 x 2.9 x 3.2 cm infrarenal AAA.   Bradycardia    a. Requiring discontinuation of use of BB/CCB   Cardiac arrest - ventricular fibrillation    a. 03/2012 in setting of hypokalemia (prolonged hosp with VDRF, tracheobronchitis, ARF, shock liver, PAF, AMS felt secondary to post-anoxic encephalopathy/shock).   Carotid artery stenosis    a. 01/2011 - 40-59% bilateral stenosis;  b. 06/2015 Carotid U/S: 40-59% bilat ICA stenosis->f/u 6 mos.   CKD (chronic kidney disease), stage III    Coronary artery disease    a. s/p aborted ant STEMI tx with Cypher DES to LAD 10/04 (residual at cath: D1 50%, CFX 40% and multiple dist 70%, EF 55%);   b. myoview 3/10: Ef 47%, infero-apical isch, LOW RISK - med Tx recommended;  c. 12/2012 VF Arrest/Cath: LAD 40isr, 80 apical, LCX 100 (failed PTCA), RCA nondom.   Diabetes mellitus    Diverticulosis 2001   Elevated LFTs    Shock liver 03/2012   H/O: CVA (cardiovascular accident)     Hematemesis    a. 12/2010 felt 2/2 Mallory Weiss tear - pt could not afford colonscopy/EGD at that time so Coumadin was deferred. Coumadin initiated 03/2012 without any evidence for bleeding.   History of Ischemic Cardiomyopathy    a. EF 50-55% by echo 03/09/12 (was 30% by echo 02/24/12); b. 03/2013 Echo: EF 55-60%, mild MR, sev dil LA, mod dil RA, PASP 58mHg.   Hypertensive heart disease    a. 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 Mild by echo 03/2012 and 03/2013.   Mixed Hyperlipidemia    [place]ipheral vascular disease ([place]    a. h/o LE angioplasty;  b. 08/2015 Duplex: >50% RCIA, 100% REIA, >50% LEIA, elev vel in SMA, patent IVC.   Persistent atrial fibrillation ([place]    a.  Dx 12/2010-->coumadin (CHA2DS2VASc = 7).   QT prolongation     Past Surgical History:  Procedure Laterality Date   HERNIA REPAIR     LEFT HEART CATH N/A 12/30/2012   Procedure: LEFT HEART CATH;  Surgeon: [name], MD;  Location[facility] CATH LAB;  Service: Cardiovascular;  Laterality: N/A;   ORIF TIBIA & FIBULA FRACTURES  01/11/07   OPEN TX OF UNICONDYLAR PLATEAU FRACTURE, IRRIGATION/DEBRIDEMENT OF OPEN FRACTURE INCLUDING BONE, REMOVAL OF EXTERNAL FIXATOR UNDER ANESTHESIA PER DR. MICHAEL HANDY   PERIPHERAL VASCULAR CATHETERIZATION N/A 12/21/2015   Procedure: Lower Extremity Angiography;  Surgeon: [name], MD;  Location: [facility] CV LAB;  Service: Cardiovascular;  Laterality: N/A;   POPLITEAL ARTERY ANGIOPLASTY  01/07/07   s/p LEFT POPLITEAL ARTERY EXPLORATION AND VEIN PATCH ANGIOPLASTY, PER DR. EARLY, SECONDARY TO ISCHEMIC LEFT FOOT RELATED TO LEFT POPLITEAL ARTERY INJURY   RIGHT HEART CATH N/A 10/02/2019   Procedure: RIGHT HEART CATH;  Surgeon: [name], MD;  Location: [facility] CV LAB;  Service: Cardiovascular;   Laterality: N/A;   SKIN GRAFT     TEE WITHOUT CARDIOVERSION N/A 08/08/2019   Procedure: TRANSESOPHAGEAL ECHOCARDIOGRAM (TEE);  Surgeon: Lelon Perla, MD;  Location: [facility] ENDOSCOPY;  Service: Cardiovascular;  Laterality: N/A;    Current Medications: Current Meds  Medication Sig   acetaminophen (TYLENOL) 325 MG tablet Take 650 mg by mouth every 6 (six) hours as needed for headache.   albuterol (VENTOLIN HFA) 108 (90 Base) MCG/ACT inhaler Inhale 2 puffs into the lungs every 6 (six) hours as needed for wheezing or shortness of breath.   apixaban (ELIQUIS) 5 MG TABS tablet Take 1 tablet (5 mg total) by mouth 2 (two) times daily.   aspirin EC 81 MG EC tablet Take 1 tablet (81 mg total) by mouth daily.   atorvastatin (LIPITOR) 80 MG tablet Take 80 mg by mouth daily.   Ensure (ENSURE) Take 237 mLs by mouth 2 (two) times daily as needed (nutritional supplement).   Fluticasone-Salmeterol (ADVAIR DISKUS) 250-50 MCG/DOSE AEPB Inhale 1 puff into the lungs 2 (two) times daily for 14 days.   hydrALAZINE (APRESOLINE) 10 MG tablet Take 1 tablet (10 mg total) by mouth 2 (two) times daily.   isosorbide dinitrate (ISORDIL) 20 MG tablet Take 1 tablet (20 mg total) by mouth 3 (three) times daily.   metoprolol succinate (TOPROL-XL) 50 MG 24 hr tablet Take 3 tablets (150 mg total) by mouth daily. Take with or immediately following a meal.   nitroGLYCERIN (NITROSTAT) 0.4 MG SL tablet Place 1 tablet (0.4 mg total) under the tongue every 5 (five) minutes as needed for chest pain (Up to 3 doses).   potassium chloride SA (KLOR-CON) 20 MEQ tablet Take 1 tablet (20 mEq total) by mouth 2 (two) times daily.   predniSONE (DELTASONE) 10 MG tablet Take 1 tablet (10 mg total) by mouth daily with breakfast for 5 days.   torsemide (DEMADEX) 20 MG tablet Take 3 tablets (60 mg total) by mouth 2 (two) times daily.     Allergies:   Patient has no known allergies.   Social History   Socioeconomic History    Marital status: Married    Spouse name: Not on file   Number of children: 4   Years of education: Not on file   Highest education level: Not on file  Occupational History   Occupation: [employer]    Employer: RETIRED  Tobacco Use   Smoking status:  Former Smoker    Packs/day: 0.50    Years: 30.00    Pack years: 15.00    Types: Cigarettes    Quit date: 03/12/2012    Years since quitting: 7.5   Smokeless tobacco: Never Used  Substance and Sexual Activity   Alcohol use: No    Alcohol/week: 0.0 standard drinks   Drug use: No    Frequency: 2.0 times per week   Sexual activity: Not on file  Other Topics Concern   Not on file  Social History Narrative   ** Merged History Encounter **       MARRIED   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 YES. 1/2 -1 PPD OF CIGARETTES    NO ETOH   Social Determinants of Health   Financial Resource Strain:    Difficulty of Paying Living Expenses: Not on file  Food Insecurity:    Worried About Charity fundraiser in the Last Year: Not on file   [employer] of Food in the Last Year: Not on file  Transportation Needs:    Lack of Transportation (Medical): Not on file   Lack of Transportation (Non-Medical): Not on file  Physical Activity:    Days of Exercise per Week: Not on file   Minutes of Exercise per Session: Not on file  Stress:    Feeling of Stress : Not on file  Social Connections:    Frequency of Communication with Friends and Family: Not on file   Frequency of Social Gatherings with Friends and Family: Not on file   Attends Religious Services: Not on file   Active Member of Clubs or Organizations: Not on file   Attends Archivist Meetings: Not on file   Marital Status: Not on file     Family History: The patient's family history includes Brain cancer in his mother; Heart disease in his father; Heart failure in his father; Leukemia in his mother; Sickle cell anemia  in his mother and another family member.  ROS:   Please see the history of present illness.     All other systems reviewed and are negative.  EKGs/Labs/Other Studies Reviewed:    The following studies were reviewed today:  Echo 09/29/2019 1. Left ventricular ejection fraction, by visual estimation, is 25 to  30%. The left ventricle has severely decreased function. There is mildly  increased left ventricular hypertrophy.  2. Left ventricular diastolic function could not be evaluated.  3. Severely dilated left ventricular internal cavity size.  4. The left ventricle demonstrates global hypokinesis.  5. Global right ventricle has severely reduced systolic function.The  right ventricular size is mildly enlarged.  6. Left atrial size was severely dilated.  7. The interatrial septum is aneurysmal.  8. Right atrial size was severely dilated.  9. The mitral valve is normal in structure. Moderate mitral valve  regurgitation. No evidence of mitral stenosis.  10. The tricuspid valve is normal in structure. Tricuspid valve  regurgitation is mild.  11. The aortic valve is tricuspid. Aortic valve regurgitation is mild.  Mild to moderate aortic valve sclerosis/calcification without any evidence  of aortic stenosis.  12. The pulmonic valve was normal in structure. Pulmonic valve  regurgitation is mild.  13. Moderately elevated pulmonary artery systolic pressure.  14. The inferior vena cava is dilated in size with <50% respiratory  variability, suggesting right atrial pressure of 15 mmHg.  15. Severe global reduction in LV systolic function; mild LVH; 4 chamber  enlargement; severe RV dysfunction; mild AI; moderate MR; mild TR;  moderate pulmonary hypertension.   EKG:  EKG is ordered today.  The ekg ordered today demonstrates atrial fibrillation with T wave wave inversions in the lateral leads  Recent Labs: 08/15/2019: NT-Pro BNP 16,511 09/28/2019: ALT 25; B Natriuretic Peptide  2,423.4 10/06/2019: BUN 94; Creatinine, Ser 1.92; Hemoglobin 12.8; Magnesium 2.0; Platelets 175; Potassium 3.5; Sodium 132  Recent Lipid Panel    Component Value Date/Time   CHOL 127 06/25/2018 1107   TRIG 117 06/25/2018 1107   HDL 30 (L) 06/25/2018 1107   CHOLHDL 4.2 06/25/2018 1107   CHOLHDL 3.8 10/24/2014 0943   VLDL 22 10/24/2014 0943   LDLCALC 74 06/25/2018 1107   LDLDIRECT 76.3 08/13/2010 0931    Physical Exam:    VS:  BP 107/61    Pulse 75    Temp (!) 97.2 F (36.2 C)    Ht '6\' 3"'  (1.905 m)    Wt 159 lb 6.4 oz (72.3 kg)    SpO2 100%    BMI 19.92 kg/m     Wt Readings from Last 3 Encounters:  10/09/19 159 lb 6.4 oz (72.3 kg)  10/06/19 164 lb 14.5 oz (74.8 kg)  09/23/19 178 lb 3.2 oz (80.8 kg)     GEN:  Well nourished, well developed in no acute distress HEENT: Normal NECK: No JVD; No carotid bruits LYMPHATICS: No lymphadenopathy CARDIAC: RRR, no murmurs, rubs, gallops RESPIRATORY:  Clear to auscultation without rales, wheezing or rhonchi  ABDOMEN: Soft, non-tender, non-distended MUSCULOSKELETAL:  No edema; No deformity  SKIN: Warm and dry NEUROLOGIC:  Alert and oriented x 3 PSYCHIATRIC:  Normal affect   ASSESSMENT:    1. CHF (congestive heart failure), NYHA class IV, acute, systolic ([place])   2. Essential hypertension   3. Persistent atrial fibrillation ([place])   4. Coronary artery disease involving native coronary artery of native heart without angina pectoris   5. Hypokalemia    PLAN:    In order of problems listed above:  1. Chronic systolic heart failure: Baseline EF 25%.  He appears to be euvolemic.  His weight has continued to drop after recent discharge.  Continue on the current dose of diuretic.  Obtain basic metabolic panel.  I discussed with the patient and his wife sodium restriction, fluid restriction and daily weight.  I will bring the patient back in 2 to 3 weeks for reassessment.  2. Ischemic cardiomyopathy: He qualify for ICD.  Will refer him to Dr.  Lovena Le  3. Persistent atrial fibrillation: On Eliquis and metoprolol  4. CAD: Denies any recent chest pain.  On aspirin and Lipitor  5. History of hypokalemia: Recent potassium was normal.   Medication Adjustments/Labs and Tests Ordered: Current medicines are reviewed at length with the patient today.  Concerns regarding medicines are outlined above.  Orders Placed This Encounter  Procedures   Basic metabolic panel   EKG [id number]   No orders of the defined types were placed in this encounter.   Patient Instructions  Medication Instructions:  Your physician recommends that you continue on your current medications as directed. Please refer to the Current Medication list given to you today.  *If you need a refill on your cardiac medications before your next appointment, please call your pharmacy*  Lab Work: Your physician recommends that you return for lab work on Friday (10/11/19). You do not need an appointment and it is NOT fasting. The lab is open from 8 AM to 4  PM and is closed for lunch from 12:45 PM-1:45 PM.  If you have labs (blood work) drawn today and your tests are completely normal, you will receive your results only by:  [place] (if you have MyChart) OR  A paper copy in the mail If you have any lab test that is abnormal or we need to change your treatment, we will call you to review the results.   Follow-Up: At [facility], you and your health needs are our priority.  As part of our continuing mission to provide you with exceptional heart care, we have created designated Provider Care Teams.  These Care Teams include your primary Cardiologist (physician) and Advanced Practice Providers (APPs -  Physician Assistants and Nurse Practitioners) who all work together to provide you with the care you need, when you need it.  Your next appointment:   2 week(s)  The format for your next appointment:   In Person  Provider:   Almyra Deforest, PA  Other  Instructions  Please purchase a Pulse Oximeter to check your oxygen levels. (SP02)  Your physician recommends that you weigh, daily, at the same time every day, and in the same amount of clothing. Please record your daily weights on the handout provided and bring it to your next appointment.  Limit your fluid intake to 32-64 oz. Daily.   Low-Sodium Eating Plan Sodium, which is an element that makes up salt, helps you maintain a healthy balance of fluids in your body. Too much sodium can increase your blood pressure and cause fluid and waste to be held in your body. Your health care provider or dietitian may recommend following this plan if you have high blood pressure (hypertension), kidney disease, liver disease, or heart failure. Eating less sodium can help lower your blood pressure, reduce swelling, and protect your heart, liver, and kidneys. What are tips for following this plan? General guidelines  Most people on this plan should limit their sodium intake to 1,500-2,000 mg (milligrams) of sodium each day. Reading food labels   The Nutrition Facts label lists the amount of sodium in one serving of the food. If you eat more than one serving, you must multiply the listed amount of sodium by the number of servings.  Choose foods with less than 140 mg of sodium per serving.  Avoid foods with 300 mg of sodium or more per serving. Shopping  Look for lower-sodium products, often labeled as "low-sodium" or "no salt added."  Always check the sodium content even if foods are labeled as "unsalted" or "no salt added".  Buy fresh foods. ? Avoid canned foods and premade or frozen meals. ? Avoid canned, cured, or processed meats  Buy breads that have less than 80 mg of sodium per slice. Cooking  Eat more home-cooked food and less restaurant, buffet, and fast food.  Avoid adding salt when cooking. Use salt-free seasonings or herbs instead of table salt or sea salt. Check with your health  care provider or pharmacist before using salt substitutes.  Cook with plant-based oils, such as canola, sunflower, or olive oil. Meal planning  When eating at a restaurant, ask that your food be prepared with less salt or no salt, if possible.  Avoid foods that contain MSG (monosodium glutamate). MSG is sometimes added to [place] food, bouillon, and some canned foods. What foods are recommended? The items listed may not be a complete list. Talk with your dietitian about what dietary choices are best for you. Grains Low-sodium cereals,  including oats, puffed wheat and rice, and shredded wheat. Low-sodium crackers. Unsalted rice. Unsalted pasta. Low-sodium bread. Whole-grain breads and whole-grain pasta. Vegetables Fresh or frozen vegetables. "No salt added" canned vegetables. "No salt added" tomato sauce and paste. Low-sodium or reduced-sodium tomato and vegetable juice. Fruits Fresh, frozen, or canned fruit. Fruit juice. Meats and other protein foods Fresh or frozen (no salt added) meat, poultry, seafood, and fish. Low-sodium canned tuna and salmon. Unsalted nuts. Dried peas, beans, and lentils without added salt. Unsalted canned beans. Eggs. Unsalted nut butters. Dairy Milk. Soy milk. Cheese that is naturally low in sodium, such as ricotta cheese, fresh mozzarella, or Swiss cheese Low-sodium or reduced-sodium cheese. Cream cheese. Yogurt. Fats and oils Unsalted butter. Unsalted margarine with no trans fat. Vegetable oils such as canola or olive oils. Seasonings and other foods Fresh and dried herbs and spices. Salt-free seasonings. Low-sodium mustard and ketchup. Sodium-free salad dressing. Sodium-free light mayonnaise. Fresh or refrigerated horseradish. Lemon juice. Vinegar. Homemade, reduced-sodium, or low-sodium soups. Unsalted popcorn and pretzels. Low-salt or salt-free chips. What foods are not recommended? The items listed may not be a complete list. Talk with your dietitian about  what dietary choices are best for you. Grains Instant hot cereals. Bread stuffing, pancake, and biscuit mixes. Croutons. Seasoned rice or pasta mixes. Noodle soup cups. Boxed or frozen macaroni and cheese. Regular salted crackers. Self-rising flour. Vegetables Sauerkraut, pickled vegetables, and relishes. Olives. [place] fries. Onion rings. Regular canned vegetables (not low-sodium or reduced-sodium). Regular canned tomato sauce and paste (not low-sodium or reduced-sodium). Regular tomato and vegetable juice (not low-sodium or reduced-sodium). Frozen vegetables in sauces. Meats and other protein foods Meat or fish that is salted, canned, smoked, spiced, or pickled. Bacon, ham, sausage, hotdogs, corned beef, chipped beef, packaged lunch meats, salt pork, jerky, pickled herring, anchovies, regular canned tuna, sardines, salted nuts. Dairy Processed cheese and cheese spreads. Cheese curds. Blue cheese. Feta cheese. String cheese. Regular cottage cheese. Buttermilk. Canned milk. Fats and oils Salted butter. Regular margarine. Ghee. Bacon fat. Seasonings and other foods Onion salt, garlic salt, seasoned salt, table salt, and sea salt. Canned and packaged gravies. Worcestershire sauce. Tartar sauce. Barbecue sauce. Teriyaki sauce. Soy sauce, including reduced-sodium. Steak sauce. Fish sauce. Oyster sauce. Cocktail sauce. Horseradish that you find on the shelf. Regular ketchup and mustard. Meat flavorings and tenderizers. Bouillon cubes. Hot sauce and Tabasco sauce. Premade or packaged marinades. Premade or packaged taco seasonings. Relishes. Regular salad dressings. Salsa. Potato and tortilla chips. Corn chips and puffs. Salted popcorn and pretzels. Canned or dried soups. Pizza. Frozen entrees and pot pies. Summary  Eating less sodium can help lower your blood pressure, reduce swelling, and protect your heart, liver, and kidneys.  Most people on this plan should limit their sodium intake to 1,500-2,000 mg  (milligrams) of sodium each day.  Canned, boxed, and frozen foods are high in sodium. Restaurant foods, fast foods, and pizza are also very high in sodium. You also get sodium by adding salt to food.  Try to cook at home, eat more fresh fruits and vegetables, and eat less fast food, canned, processed, or prepared foods. This information is not intended to replace advice given to you by your health care provider. Make sure you discuss any questions you have with your health care provider. Document Revised: 08/04/2017 Document Reviewed: 08/15/2016 Elsevier Patient Education  2020 [place], [place], [place]  10/12/2019 12:03 AM    [place]

## 2019-10-11 ENCOUNTER — Encounter: Payer: Self-pay | Admitting: Internal Medicine

## 2019-10-11 ENCOUNTER — Encounter: Payer: Self-pay | Admitting: Physician Assistant

## 2019-10-14 NOTE — Telephone Encounter (Signed)
Patient was seen in office on 02/03

## 2019-10-17 ENCOUNTER — Telehealth: Payer: Self-pay | Admitting: Cardiology

## 2019-10-17 NOTE — Telephone Encounter (Signed)
Pt was in the hospital 09/28/19- 10/06/19 and was D/c'd with planned home O2 per the case manager and to follow up with Dr. [name] and his PCP but he does not have a PCP and has not been established with one...   Maggie with [facility] [phone] Ext [phone] has received the referral for the Home O2 but no orders and the only MD listed is Dr. [name]...   She says they have not provided any care at this point and is asking for Dr. [name] to help with the orders.   Will forward to Dr. [name] for review. Pt saw Almyra Deforest PA for post hospital and post R heart cath on 10/09/19 with next follow up planned 10/23/19.

## 2019-10-17 NOTE — Telephone Encounter (Signed)
[place] for home [place]

## 2019-10-17 NOTE — Telephone Encounter (Signed)
Left message for maggie to call

## 2019-10-17 NOTE — Telephone Encounter (Signed)
Maggie from [place] at [facility] stating they received a referral to see the patient, but no orders. She would like to know if Dr. [name] could send nursing orders for the patient.

## 2019-10-21 DIAGNOSIS — I739 Peripheral vascular disease, unspecified: Secondary | ICD-10-CM | POA: Diagnosis not present

## 2019-10-21 DIAGNOSIS — I2510 Atherosclerotic heart disease of native coronary artery without angina pectoris: Secondary | ICD-10-CM | POA: Diagnosis not present

## 2019-10-21 DIAGNOSIS — I5082 Biventricular heart failure: Secondary | ICD-10-CM | POA: Diagnosis not present

## 2019-10-21 DIAGNOSIS — I130 Hypertensive heart and chronic kidney disease with heart failure and stage 1 through stage 4 chronic kidney disease, or unspecified chronic kidney disease: Secondary | ICD-10-CM | POA: Diagnosis not present

## 2019-10-21 DIAGNOSIS — J449 Chronic obstructive pulmonary disease, unspecified: Secondary | ICD-10-CM | POA: Diagnosis not present

## 2019-10-21 DIAGNOSIS — I6523 Occlusion and stenosis of bilateral carotid arteries: Secondary | ICD-10-CM | POA: Diagnosis not present

## 2019-10-21 DIAGNOSIS — I4821 Permanent atrial fibrillation: Secondary | ICD-10-CM | POA: Diagnosis not present

## 2019-10-21 DIAGNOSIS — I5023 Acute on chronic systolic (congestive) heart failure: Secondary | ICD-10-CM | POA: Diagnosis not present

## 2019-10-21 DIAGNOSIS — N184 Chronic kidney disease, stage 4 (severe): Secondary | ICD-10-CM | POA: Diagnosis not present

## 2019-10-21 NOTE — Telephone Encounter (Signed)
Left message for maggie to call

## 2019-10-22 ENCOUNTER — Other Ambulatory Visit: Payer: Self-pay | Admitting: Cardiology

## 2019-10-22 ENCOUNTER — Telehealth: Payer: Self-pay | Admitting: Cardiology

## 2019-10-22 MED ORDER — NITROGLYCERIN 0.4 MG SL SUBL: 0.4000 mg | SUBLINGUAL_TABLET | SUBLINGUAL | 4 refills | Status: DC | PRN

## 2019-10-22 NOTE — Telephone Encounter (Signed)
*  STAT* If patient is at the pharmacy, call can be transferred to refill team.   1. Which medications need to be refilled? (please list name of each medication and dose if known) nitroGLYCERIN (NITROSTAT) 0.4 MG SL tablet  2. Which pharmacy/location (including street and city if local pharmacy) is medication to be sent to?  [facility],  - [place]        3. Do they need a 30 day or 90 day supply? 30 day

## 2019-10-22 NOTE — Telephone Encounter (Signed)
Spoke with [place] the home health for [facility]. Kenney Houseman would like nursing orders to see patient 2x's a week for 3 weeks, then 1 time a week for 6 weeks for CHF.  Tonya asked about fluid restriction, oxygen use and weight parameters. Per AVS patient is on a 32-64 oz per day fluid restriction. On 1/23 the hospital discharge summary wrote for continuous oxygen to maintain O2 saturations at 92% or higher. Patient should contact office if he gains 3 lbs in a day or 5 lbs in a week.   Will route to MD to approve home health nurse frequency.

## 2019-10-22 NOTE — Telephone Encounter (Signed)
[place] for home health orders Kirk Ruths

## 2019-10-22 NOTE — Telephone Encounter (Signed)
See other phone note

## 2019-10-22 NOTE — Telephone Encounter (Signed)
Logan White, would like nursing orders to see patient to see patient 2x's a week for 3 weeks, then 1 time a week for 6 weeks for CHF.   Patient stated he is on a fluid restriction, but he doesn't know what it is.  He also went home with oxygen, patient is unsure if he is to where it all the time or as needed.  Patient is also unsure if he gains 2lbs in 24 hours or 5lbs in a weeks. Nurse wants to know who to treat fluid retention.

## 2019-10-22 NOTE — Telephone Encounter (Signed)
Spoke with [place], home health orders given.

## 2019-10-23 ENCOUNTER — Encounter: Payer: Medicare HMO | Admitting: Physician Assistant

## 2019-10-25 NOTE — Progress Notes (Signed)
This encounter was created in error - please disregard.

## 2019-10-29 ENCOUNTER — Other Ambulatory Visit: Payer: Self-pay

## 2019-10-29 ENCOUNTER — Ambulatory Visit (INDEPENDENT_AMBULATORY_CARE_PROVIDER_SITE_OTHER): Payer: Medicare HMO | Admitting: Physician Assistant

## 2019-10-29 ENCOUNTER — Encounter: Payer: Self-pay | Admitting: Physician Assistant

## 2019-10-29 VITALS — BP 114/68 | HR 72 | Ht 74.5 in | Wt 176.0 lb

## 2019-10-29 DIAGNOSIS — I4821 Permanent atrial fibrillation: Secondary | ICD-10-CM

## 2019-10-29 DIAGNOSIS — I739 Peripheral vascular disease, unspecified: Secondary | ICD-10-CM | POA: Diagnosis not present

## 2019-10-29 DIAGNOSIS — I5021 Acute systolic (congestive) heart failure: Secondary | ICD-10-CM | POA: Diagnosis not present

## 2019-10-29 DIAGNOSIS — M79604 Pain in right leg: Secondary | ICD-10-CM

## 2019-10-29 DIAGNOSIS — I340 Nonrheumatic mitral (valve) insufficiency: Secondary | ICD-10-CM | POA: Diagnosis not present

## 2019-10-29 DIAGNOSIS — I10 Essential (primary) hypertension: Secondary | ICD-10-CM | POA: Diagnosis not present

## 2019-10-29 DIAGNOSIS — M79605 Pain in left leg: Secondary | ICD-10-CM

## 2019-10-29 DIAGNOSIS — I255 Ischemic cardiomyopathy: Secondary | ICD-10-CM | POA: Diagnosis not present

## 2019-10-29 DIAGNOSIS — I2510 Atherosclerotic heart disease of native coronary artery without angina pectoris: Secondary | ICD-10-CM | POA: Diagnosis not present

## 2019-10-29 DIAGNOSIS — R0689 Other abnormalities of breathing: Secondary | ICD-10-CM | POA: Diagnosis not present

## 2019-10-29 DIAGNOSIS — I4819 Other persistent atrial fibrillation: Secondary | ICD-10-CM | POA: Diagnosis not present

## 2019-10-29 NOTE — Progress Notes (Signed)
Cardiology Office Note:    Date:  10/30/2019   ID:  Logan White, Logan White 1941-08-19, MRN [id number]  PCP:  Patient, No Pcp Per  Cardiologist:  Kirk Ruths, MD  Electrophysiologist:  None  PV specialist: Dr. Gwenlyn Found  Referring MD: No ref. provider found   Chief Complaint  Patient presents with  . Follow-up    seen for Dr. [name]    History of Present Illness:    Logan White is a 79 y.o. male with a hx of permanent atrial fibrillation, CAD, V. fib arrest, ICM and CHF.  He had a PCI of his LAD in October 2004.  He was admitted in June 2013 for V. fib arrest felt to be related to severe hypokalemia with potassium 2.2. Cardiac catheterization in April 2014 showed patent stent in the LAD with 40% in-stent restenosis, apical LAD had 80% lesion, left circumflex artery was occluded, right coronary artery was small and nondominant. He had attempted at CTO PCI of left circumflex however this was unsuccessful. Echocardiogram in April 2014 showed EF down to 30 to 35%, mild LAE, moderately reduced RV function with mild MR. He was seen by Dr. Lovena Le who felt ICD was not indicated at the time because patient did suffer NSTEMI with positive enzymes. He was discharged with what [place] in January 2017 showed fixed inferior and apical defect with no ischemia. Lower extremity angiography in April 2017 that showed occluded right external iliac and common femoral artery not amenable to PCI and 80% left common femoral artery stenosis, occluded left SFA also not amenable to PCI. He was managed medically. Echocardiogram in March 2018 showed EF 30 to 35%, moderate AI, severe MR, biatrial enlargement.AAA Doppler in July 2020 showed 3 cm dilatation, 2-year follow-up study was recommended. Most recent carotid Doppler obtained on 08/07/2019 showed moderate disease bilaterally.  He was admitted in November 2020 with lower extremity edema and shortness of breath.  He was treated with IV Lasix  with improvement.  Echocardiogram obtained during this admission showed EF 30 to 35%, severe MR, moderate AI.  He was seen by Dr. Harrington Challenger who recommended addition of low-dose Imdur.  Hydralazine and losartan were discontinued.  Creatinine trending up prior to discharge.  He was discharged on 60 mg every other day of Lasix.  This was further increased to 60 mg daily on follow-up due to weight gain and worsening lower extremity edema.  When I saw the patient back in November 2020, he appears to be euvolemic.  I discussed with the patient both ICD implantation and work-up for his severe MR.  The patient did not wish to consider ICD at the time however they were agreeable to proceed with work-up for severe MR.  TEE obtained in December 2020 showed EF 25 to 30%, biatrial enlargement, moderate MR, mild tricuspid regurgitation.  His Lasix was switched to torsemide.  He was last seen by Levell July on 09/23/2019, hydralazine 10 mg twice daily was added to his medical regimen.  He complained of increasing dyspnea on exertion, it was suspected some his breathing issue may be related to underlying COPD therefore he was referred to pulmonology service.  He was readmitted to the hospital on 09/28/2019 due to worsening exertional dyspnea with questionable syncope.  O2 saturation was 87% requiring 3 L nasal cannula.  Creatinine worsened from previous 1.8 to 2.6.  BNP was quite elevated at 2423.  Chest x-ray showed cardiomegaly with findings concerning for congestive heart failure.  It is difficult to  tell his volume status, therefore he underwent a right heart cath on 10/02/2019, this revealed moderate pulmonary hypertension with mean pressure of 42 mmHg, elevated mean pulmonary wedge pressure of 20 mmHg, cardiac output 4.24, cardiac index 2.0, pulmonary artery pulsatility index of 1.7 consistent with right heart failure.  He underwent aggressive IV diuresis and net removal of 2 L of fluid.  He was eventually discharged on Demadex  60 mg twice daily.  Given questionable syncope, Dr. Rayann Heman did not recommend heart monitor upon discharge as patient already met ICD criteria.  Therefore he recommended follow-up with Dr. Lovena Le.  He was instructed no driving for 6 months. D/C dry weight 164.9 lbs.   When I saw the patient on 2/30/2021, his weight was likely 5 pounds lower than his discharge weight.  I informed the patient to keep fluid restriction to between 32 and 64 ounces per day.  Since I last saw the patient, he has been set up with home oxygen.  He presents today for cardiology office visit.  He is currently on 2 L home oxygen.  I have filled out temporary 6 months disability parking placard for him.  On physical exam, he continued to have wheezing in the lung, however I do not hear any fluid.  I suspect he has underlying pulmonary issue on top of the cardiac issue.  Unfortunately he never obtained the basic metabolic panel I ordered the last time.  We will obtain today.  He has been having worsening right thigh pain that is worse with walking.  I will obtain ABI.  He has known severe peripheral arterial disease and was seen by Dr. Gwenlyn Found in the past.  Otherwise, I think from the volume perspective, he is stable and can follow-up with Dr. [name] in 1 month.  Past Medical History:  Diagnosis Date  . AAA (abdominal aortic aneurysm) ([place])    a. 08/2015 Abd U/S: 2.7 x 2.9 x 3.2 cm infrarenal AAA.  . Bradycardia    a. Requiring discontinuation of use of BB/CCB  . Cardiac arrest - ventricular fibrillation    a. 03/2012 in setting of hypokalemia (prolonged hosp with VDRF, tracheobronchitis, ARF, shock liver, PAF, AMS felt secondary to post-anoxic encephalopathy/shock).  . Carotid artery stenosis    a. 01/2011 - 40-59% bilateral stenosis;  b. 06/2015 Carotid U/S: 40-59% bilat ICA stenosis->f/u 6 mos.  . CKD (chronic kidney disease), stage III   . Coronary artery disease    a. s/p aborted ant STEMI tx with Cypher DES to LAD 10/04  (residual at cath: D1 50%, CFX 40% and multiple dist 70%, EF 55%);   b. myoview 3/10: Ef 47%, infero-apical isch, LOW RISK - med Tx recommended;  c. 12/2012 VF Arrest/Cath: LAD 40isr, 80 apical, LCX 100 (failed PTCA), RCA nondom.  . Diabetes mellitus   . Diverticulosis 2001  . Elevated LFTs    Shock liver 03/2012  . H/O: CVA (cardiovascular accident)   . Hematemesis    a. 12/2010 felt 2/2 Mallory Weiss tear - pt could not afford colonscopy/EGD at that time so Coumadin was deferred. Coumadin initiated 03/2012 without any evidence for bleeding.  Marland Kitchen History of Ischemic Cardiomyopathy    a. EF 50-55% by echo 03/09/12 (was 30% by echo 02/24/12); b. 03/2013 Echo: EF 55-60%, mild MR, sev dil LA, mod dil RA, PASP 64mHg.  .Marland KitchenHypertensive heart disease    a. echo 4/12: EF 50%, asymmetric septal hypertrophy, no SAM or LVOT gradient, LAE, PASP 35  . Inguinal hernia   .  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 Mitral regurgitation    a. Mild by echo 03/2012 and 03/2013.  . Mixed Hyperlipidemia   . Peripheral vascular disease ([place])    a. h/o LE angioplasty;  b. 08/2015 Duplex: >50% RCIA, 100% REIA, >50% LEIA, elev vel in SMA, patent IVC.  Marland Kitchen Persistent atrial fibrillation ([place])    a. Dx 12/2010-->coumadin (CHA2DS2VASc = 7).  . QT prolongation     Past Surgical History:  Procedure Laterality Date  . HERNIA REPAIR    . LEFT HEART CATH N/A 12/30/2012   Procedure: LEFT HEART CATH;  Surgeon: [name], MD;  Location: [facility] CATH LAB;  Service: Cardiovascular;  Laterality: N/A;  . ORIF TIBIA & FIBULA FRACTURES  01/11/07   OPEN TX OF UNICONDYLAR PLATEAU FRACTURE, IRRIGATION/DEBRIDEMENT OF OPEN FRACTURE INCLUDING BONE, REMOVAL OF EXTERNAL FIXATOR UNDER ANESTHESIA PER DR. MICHAEL HANDY  . PERIPHERAL VASCULAR CATHETERIZATION N/A 12/21/2015   Procedure: Lower Extremity Angiography;  Surgeon: [name], MD;  Location: [place] CV LAB;  Service: Cardiovascular;  Laterality: N/A;  .  POPLITEAL ARTERY ANGIOPLASTY  01/07/07   s/p LEFT POPLITEAL ARTERY EXPLORATION AND VEIN PATCH ANGIOPLASTY, PER DR. EARLY, SECONDARY TO ISCHEMIC LEFT FOOT RELATED TO LEFT POPLITEAL ARTERY INJURY  . RIGHT HEART CATH N/A 10/02/2019   Procedure: RIGHT HEART CATH;  Surgeon: [name], MD;  Location: [place] CV LAB;  Service: Cardiovascular;  Laterality: N/A;  . SKIN GRAFT    . TEE WITHOUT CARDIOVERSION N/A 08/08/2019   Procedure: TRANSESOPHAGEAL ECHOCARDIOGRAM (TEE);  Surgeon: Lelon Perla, MD;  Location: [facility] ENDOSCOPY;  Service: Cardiovascular;  Laterality: N/A;    Current Medications: Current Meds  Medication Sig  . acetaminophen (TYLENOL) 325 MG tablet Take 650 mg by mouth every 6 (six) hours as needed for headache.  . albuterol (VENTOLIN HFA) 108 (90 Base) MCG/ACT inhaler Inhale 2 puffs into the lungs every 6 (six) hours as needed for wheezing or shortness of breath.  Marland Kitchen apixaban (ELIQUIS) 5 MG TABS tablet Take 1 tablet (5 mg total) by mouth 2 (two) times daily.  Marland Kitchen aspirin EC 81 MG EC tablet Take 1 tablet (81 mg total) by mouth daily.  Marland Kitchen atorvastatin (LIPITOR) 80 MG tablet Take 80 mg by mouth daily.  . Ensure (ENSURE) Take 237 mLs by mouth 2 (two) times daily as needed (nutritional supplement).  . hydrALAZINE (APRESOLINE) 10 MG tablet Take 1 tablet (10 mg total) by mouth 2 (two) times daily.  . isosorbide dinitrate (ISORDIL) 20 MG tablet Take 1 tablet (20 mg total) by mouth 3 (three) times daily.  . metoprolol succinate (TOPROL-XL) 50 MG 24 hr tablet Take 3 tablets (150 mg total) by mouth daily. Take with or immediately following a meal.  . nitroGLYCERIN (NITROSTAT) 0.4 MG SL tablet Place 1 tablet (0.4 mg total) under the tongue every 5 (five) minutes as needed for chest pain (Up to 3 doses).  . potassium chloride SA (KLOR-CON) 20 MEQ tablet Take 1 tablet (20 mEq total) by mouth 2 (two) times daily.  Marland Kitchen torsemide (DEMADEX) 20 MG tablet Take 3 tablets (60 mg total) by mouth 2 (two) times  daily.     Allergies:   Patient has no known allergies.   Social History   Socioeconomic History  . Marital status: Married    Spouse name: Not on file  . Number of children: 4  . Years of education: Not on file  . Highest education level: Not on file  Occupational History  . 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 Smoking status: Former Smoker    Packs/day: 0.50    Years: 30.00    Pack years: 15.00    Types: Cigarettes    Quit date: 03/12/2012    Years since quitting: 7.6  . Smokeless tobacco: Never Used  Substance and Sexual Activity  . Alcohol use: No    Alcohol/week: 0.0 standard drinks  . Drug use: No    Frequency: 2.0 times per week  . Sexual activity: Not on file  Other Topics Concern  . 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 YES. 1/2 -1 PPD OF CIGARETTES    NO ETOH   Social Determinants of Health   Financial Resource Strain:   . Difficulty of Paying Living Expenses: Not on file  Food Insecurity:   . Worried About Charity fundraiser in the Last Year: Not on file  . Ran Out of Food in the Last Year: Not on file  Transportation Needs:   . Lack of Transportation (Medical): Not on file  . Lack of Transportation (Non-Medical): Not on file  Physical Activity:   . Days of Exercise per Week: Not on file  . Minutes of Exercise per Session: Not on file  Stress:   . Feeling of Stress : Not on file  Social Connections:   . Frequency of Communication with Friends and Family: Not on file  . Frequency of Social Gatherings with Friends and Family: Not on file  . Attends Religious Services: Not on file  . Active Member of Clubs or Organizations: Not on file  . Attends Archivist Meetings: Not on file  . Marital Status: Not on file     Family History: The patient's family history includes Brain cancer in his  mother; Heart disease in his father; Heart failure in his father; Leukemia in his mother; Sickle cell anemia in his mother and another family member.  ROS:   Please see the history of present illness.     All other systems reviewed and are negative.  EKGs/Labs/Other Studies Reviewed:    The following studies were reviewed today:  Echo 09/29/2019 1. Left ventricular ejection fraction, by visual estimation, is 25 to  30%. The left ventricle has severely decreased function. There is mildly  increased left ventricular hypertrophy.  2. Left ventricular diastolic function could not be evaluated.  3. Severely dilated left ventricular internal cavity size.  4. The left ventricle demonstrates global hypokinesis.  5. Global right ventricle has severely reduced systolic function.The  right ventricular size is mildly enlarged.  6. Left atrial size was severely dilated.  7. The interatrial septum is aneurysmal.  8. Right atrial size was severely dilated.  9. The mitral valve is normal in structure. Moderate mitral valve  regurgitation. No evidence of mitral stenosis.  10. The tricuspid valve is normal in structure. Tricuspid valve  regurgitation is mild.  11. The aortic valve is tricuspid. Aortic valve regurgitation is mild.  Mild to moderate aortic valve sclerosis/calcification without any evidence  of aortic stenosis.  12. The pulmonic valve was normal in structure. Pulmonic valve  regurgitation is mild.  13. Moderately elevated pulmonary artery systolic pressure.  14. The inferior vena cava is dilated in size with <50% respiratory  variability, suggesting right atrial pressure of 15 mmHg.  15. Severe global reduction in LV systolic function; mild LVH; 4 chamber  enlargement; severe RV dysfunction; mild AI; moderate MR; mild TR;  moderate pulmonary hypertension.    RHC 10/02/2019  Moderate pulmonary hypertension with mean PA pressure of 42 mmHg.  Mean pulmonary capillary wedge  pressure 28 mmHg.  Phasic pulmonary artery pressure 63/29 mmHg, mean RA pressure 20 mmHg, and pulmonary artery pulsatility index is 1.7 (suggesting RV dysfunction)  Mixed venous O2 saturation 57%  EKG:  EKG is not ordered today.   Recent Labs: 08/15/2019: NT-Pro BNP 16,511 09/28/2019: ALT 25; B Natriuretic Peptide 2,423.4 10/06/2019: Hemoglobin 12.8; Magnesium 2.0; Platelets 175 10/29/2019: BUN 37; Creatinine, Ser 2.09; Potassium 4.2; Sodium 139  Recent Lipid Panel    Component Value Date/Time   CHOL 127 06/25/2018 1107   TRIG 117 06/25/2018 1107   HDL 30 (L) 06/25/2018 1107   CHOLHDL 4.2 06/25/2018 1107   CHOLHDL 3.8 10/24/2014 0943   VLDL 22 10/24/2014 0943   LDLCALC 74 06/25/2018 1107   LDLDIRECT 76.3 08/13/2010 0931    Physical Exam:    VS:  BP 114/68   Pulse 72   Ht 6' 2.5" (1.892 m)   Wt 176 lb (79.8 kg)   SpO2 99%   BMI 22.29 kg/m     Wt Readings from Last 3 Encounters:  10/29/19 176 lb (79.8 kg)  10/09/19 159 lb 6.4 oz (72.3 kg)  10/06/19 164 lb 14.5 oz (74.8 kg)     GEN:  Well nourished, well developed in no acute distress HEENT: Normal NECK: No JVD; No carotid bruits LYMPHATICS: No lymphadenopathy CARDIAC: Irregularly irregular, no murmurs, rubs, gallops RESPIRATORY: Decreased breath sounds, occasional wheezing ABDOMEN: Soft, non-tender, non-distended MUSCULOSKELETAL:  No edema; No deformity  SKIN: Warm and dry NEUROLOGIC:  Alert and oriented x 3 PSYCHIATRIC:  Normal affect   ASSESSMENT:    1. PAD (peripheral artery disease) (HCC)   2. Pain in both lower extremities   3. Permanent atrial fibrillation ([place])   4. Coronary artery disease involving native coronary artery of native heart without angina pectoris   5. Ischemic cardiomyopathy   6. Moderate mitral regurgitation   7. Chronic respiratory insufficiency    PLAN:    In order of problems listed above:  1. PAD: He has bilateral lower extremity pain concerning for claudication symptoms.  He  was previously seen by Dr. Gwenlyn Found.  We will repeat ABI  2. Permanent atrial fibrillation: Rate controlled.  Continue Eliquis  3. CAD: Denies any chest pain  4. Ischemic cardiomyopathy: Appears to be euvolemic on physical exam.  Continue Imdur/hydralazine and Toprol-XL  5. Mitral regurgitation: Moderate by previous TEE.  6. Chronic respiratory insufficiency: Recently started on home oxygen.  I suspect he has underlying pulmonary issue.  Appears to have wheezing on physical exam.   Medication Adjustments/Labs and Tests Ordered: Current medicines are reviewed at length with the patient today.  Concerns regarding medicines are outlined above.  Orders Placed This Encounter  Procedures  . VAS [place] ABI WITH/WO TBI  . VAS [place] LOWER EXTREMITY ARTERIAL DUPLEX   No orders of the defined types were placed in this encounter.   Patient Instructions  Medication Instructions:  Your physician recommends that you continue on your current medications as directed. Please refer to the Current Medication list given to you today.  *If you need a refill on your cardiac medications before your next appointment, please call your pharmacy*  Lab  Work: You will need to have labs (blood work) drawn today: oRDER [place]  BMET  If you have labs (blood work) drawn today and your tests are completely normal, you will receive your results only by: Marland Kitchen MyChart Message (if you have MyChart) OR . A paper copy in the mail If you have any lab test that is abnormal or we need to change your treatment, we will call you to review the results.  Testing/Procedures: Your physician has requested that you have an ankle brachial index (ABI). During this test an ultrasound and blood pressure cuff are used to evaluate the arteries that supply the arms and legs with blood. Allow thirty minutes for this exam. There are no restrictions or special instructions.  Your physician has requested that you have a lower  extremity arterial exercise duplex. During this test, exercise and ultrasound are used to evaluate arterial blood flow in the legs. Allow one hour for this exam. There are no restrictions or special instructions.   PLEASE SCHEDULE WITHIN 1 MONTH  Follow-Up: At [facility], you and your health needs are our priority.  As part of our continuing mission to provide you with exceptional heart care, we have created designated Provider Care Teams.  These Care Teams include your primary Cardiologist (physician) and Advanced Practice Providers (APPs -  Physician Assistants and Nurse Practitioners) who all work together to provide you with the care you need, when you need it.  Your next appointment:   [place]    The format for your next appointment:   In Person  Provider:   Kirk Ruths, MD  Other Instructions      Signed, Almyra Deforest, [place]  10/30/2019 11:57 PM    [place]

## 2019-10-29 NOTE — Patient Instructions (Addendum)
Medication Instructions:  Your physician recommends that you continue on your current medications as directed. Please refer to the Current Medication list given to you today.  *If you need a refill on your cardiac medications before your next appointment, please call your pharmacy*  Lab Work: You will need to have labs (blood work) drawn today: oRDER [place]  BMET  If you have labs (blood work) drawn today and your tests are completely normal, you will receive your results only by: Marland Kitchen MyChart Message (if you have MyChart) OR . A paper copy in the mail If you have any lab test that is abnormal or we need to change your treatment, we will call you to review the results.  Testing/Procedures: Your physician has requested that you have an ankle brachial index (ABI). During this test an ultrasound and blood pressure cuff are used to evaluate the arteries that supply the arms and legs with blood. Allow thirty minutes for this exam. There are no restrictions or special instructions.  Your physician has requested that you have a lower extremity arterial exercise duplex. During this test, exercise and ultrasound are used to evaluate arterial blood flow in the legs. Allow one hour for this exam. There are no restrictions or special instructions.   PLEASE SCHEDULE WITHIN 1 MONTH  Follow-Up: At [facility], you and your health needs are our priority.  As part of our continuing mission to provide you with exceptional heart care, we have created designated Provider Care Teams.  These Care Teams include your primary Cardiologist (physician) and Advanced Practice Providers (APPs -  Physician Assistants and Nurse Practitioners) who all work together to provide you with the care you need, when you need it.  Your next appointment:   [place]    The format for your next appointment:   In Person  Provider:   Kirk Ruths, MD  Other Instructions

## 2019-10-30 ENCOUNTER — Encounter: Payer: Self-pay | Admitting: *Deleted

## 2019-10-30 ENCOUNTER — Encounter: Payer: Self-pay | Admitting: Physician Assistant

## 2019-10-30 DIAGNOSIS — I130 Hypertensive heart and chronic kidney disease with heart failure and stage 1 through stage 4 chronic kidney disease, or unspecified chronic kidney disease: Secondary | ICD-10-CM | POA: Diagnosis not present

## 2019-10-30 DIAGNOSIS — J449 Chronic obstructive pulmonary disease, unspecified: Secondary | ICD-10-CM | POA: Diagnosis not present

## 2019-10-30 DIAGNOSIS — I2510 Atherosclerotic heart disease of native coronary artery without angina pectoris: Secondary | ICD-10-CM | POA: Diagnosis not present

## 2019-10-30 DIAGNOSIS — I4821 Permanent atrial fibrillation: Secondary | ICD-10-CM | POA: Diagnosis not present

## 2019-10-30 DIAGNOSIS — I6523 Occlusion and stenosis of bilateral carotid arteries: Secondary | ICD-10-CM | POA: Diagnosis not present

## 2019-10-30 DIAGNOSIS — I739 Peripheral vascular disease, unspecified: Secondary | ICD-10-CM | POA: Diagnosis not present

## 2019-10-30 DIAGNOSIS — I5082 Biventricular heart failure: Secondary | ICD-10-CM | POA: Diagnosis not present

## 2019-10-30 DIAGNOSIS — N184 Chronic kidney disease, stage 4 (severe): Secondary | ICD-10-CM | POA: Diagnosis not present

## 2019-10-30 DIAGNOSIS — I5023 Acute on chronic systolic (congestive) heart failure: Secondary | ICD-10-CM | POA: Diagnosis not present

## 2019-10-30 LAB — BASIC METABOLIC PANEL
BUN/Creatinine Ratio: 18 (ref 10–24)
BUN: 37 mg/dL — ABNORMAL HIGH (ref 8–27)
CO2: 22 mmol/L (ref 20–29)
Calcium: 8.9 mg/dL (ref 8.6–10.2)
Chloride: 101 mmol/L (ref 96–106)
Creatinine, Ser: 2.09 mg/dL — ABNORMAL HIGH (ref 0.76–1.27)
GFR calc Af Amer: 35 mL/min/{1.73_m2} — ABNORMAL LOW (ref >59)
GFR calc non Af Amer: 30 mL/min/{1.73_m2} — ABNORMAL LOW (ref >59)
Glucose: 106 mg/dL — ABNORMAL HIGH (ref 65–99)
Potassium: 4.2 mmol/L (ref 3.5–5.2)
Sodium: 139 mmol/L (ref 134–144)

## 2019-10-30 NOTE — Progress Notes (Signed)
Stable renal function and electrolyte

## 2019-10-31 ENCOUNTER — Telehealth: Payer: Self-pay | Admitting: Cardiology

## 2019-10-31 NOTE — Telephone Encounter (Signed)
Pt c/o medication issue:  1. Name of Medication: Wixela 250-50   2. How are you currently taking this medication (dosage and times per day)? Not currently taking   3. Are you having a reaction (difficulty breathing--STAT)? No  4. What is your medication issue? Kellie from [facility] is calling stating the patient is wanting to know if he can begin taking Wixela again. She states the patient was prescribed the medication while in the hospital. He was advised to take it for 14 days after discharged and they noticed good results. Lambert Keto states the patient was having some wheezing and wanted to clarify with Dr. [name] before giving the okay to take. There are 33-43 doses of the medication left. Please advise.

## 2019-10-31 NOTE — Telephone Encounter (Signed)
Will route to MD to advise of message and clarify on taking this medication again.   Thank you!

## 2019-11-01 NOTE — Telephone Encounter (Signed)
This med should be managed by primary care Kirk Ruths

## 2019-11-04 ENCOUNTER — Inpatient Hospital Stay (HOSPITAL_COMMUNITY)
Admission: EM | Admit: 2019-11-04 | Discharge: 2019-11-08 | DRG: 291 | Disposition: A | Discharge status: Home / Self Care | Payer: Medicare HMO | Attending: Internal Medicine | Admitting: Internal Medicine

## 2019-11-04 ENCOUNTER — Emergency Department (HOSPITAL_COMMUNITY): Payer: Medicare HMO

## 2019-11-04 ENCOUNTER — Inpatient Hospital Stay (HOSPITAL_COMMUNITY): Payer: Medicare HMO

## 2019-11-04 ENCOUNTER — Other Ambulatory Visit: Payer: Self-pay

## 2019-11-04 DIAGNOSIS — K640 First degree hemorrhoids: Secondary | ICD-10-CM | POA: Diagnosis present

## 2019-11-04 DIAGNOSIS — K5900 Constipation, unspecified: Secondary | ICD-10-CM | POA: Diagnosis present

## 2019-11-04 DIAGNOSIS — R0602 Shortness of breath: Secondary | ICD-10-CM

## 2019-11-04 DIAGNOSIS — K222 Esophageal obstruction: Secondary | ICD-10-CM | POA: Diagnosis not present

## 2019-11-04 DIAGNOSIS — K921 Melena: Secondary | ICD-10-CM

## 2019-11-04 DIAGNOSIS — N184 Chronic kidney disease, stage 4 (severe): Secondary | ICD-10-CM | POA: Diagnosis present

## 2019-11-04 DIAGNOSIS — I2510 Atherosclerotic heart disease of native coronary artery without angina pectoris: Secondary | ICD-10-CM | POA: Diagnosis present

## 2019-11-04 DIAGNOSIS — I255 Ischemic cardiomyopathy: Secondary | ICD-10-CM | POA: Diagnosis present

## 2019-11-04 DIAGNOSIS — I6523 Occlusion and stenosis of bilateral carotid arteries: Secondary | ICD-10-CM | POA: Diagnosis not present

## 2019-11-04 DIAGNOSIS — E1151 Type 2 diabetes mellitus with diabetic peripheral angiopathy without gangrene: Secondary | ICD-10-CM | POA: Diagnosis present

## 2019-11-04 DIAGNOSIS — K922 Gastrointestinal hemorrhage, unspecified: Secondary | ICD-10-CM | POA: Diagnosis not present

## 2019-11-04 DIAGNOSIS — R490 Dysphonia: Secondary | ICD-10-CM | POA: Diagnosis present

## 2019-11-04 DIAGNOSIS — E871 Hypo-osmolality and hyponatremia: Secondary | ICD-10-CM | POA: Diagnosis present

## 2019-11-04 DIAGNOSIS — Z8674 Personal history of sudden cardiac arrest: Secondary | ICD-10-CM

## 2019-11-04 DIAGNOSIS — I5022 Chronic systolic (congestive) heart failure: Secondary | ICD-10-CM | POA: Diagnosis not present

## 2019-11-04 DIAGNOSIS — D509 Iron deficiency anemia, unspecified: Secondary | ICD-10-CM | POA: Diagnosis present

## 2019-11-04 DIAGNOSIS — E1122 Type 2 diabetes mellitus with diabetic chronic kidney disease: Secondary | ICD-10-CM | POA: Diagnosis present

## 2019-11-04 DIAGNOSIS — I5023 Acute on chronic systolic (congestive) heart failure: Secondary | ICD-10-CM | POA: Diagnosis not present

## 2019-11-04 DIAGNOSIS — I70213 Atherosclerosis of native arteries of extremities with intermittent claudication, bilateral legs: Secondary | ICD-10-CM | POA: Diagnosis not present

## 2019-11-04 DIAGNOSIS — K3189 Other diseases of stomach and duodenum: Secondary | ICD-10-CM | POA: Diagnosis not present

## 2019-11-04 DIAGNOSIS — I509 Heart failure, unspecified: Secondary | ICD-10-CM | POA: Diagnosis not present

## 2019-11-04 DIAGNOSIS — I130 Hypertensive heart and chronic kidney disease with heart failure and stage 1 through stage 4 chronic kidney disease, or unspecified chronic kidney disease: Secondary | ICD-10-CM | POA: Diagnosis not present

## 2019-11-04 DIAGNOSIS — I480 Paroxysmal atrial fibrillation: Secondary | ICD-10-CM | POA: Diagnosis not present

## 2019-11-04 DIAGNOSIS — K635 Polyp of colon: Secondary | ICD-10-CM | POA: Diagnosis not present

## 2019-11-04 DIAGNOSIS — K2961 Other gastritis with bleeding: Secondary | ICD-10-CM | POA: Diagnosis not present

## 2019-11-04 DIAGNOSIS — E875 Hyperkalemia: Secondary | ICD-10-CM | POA: Diagnosis present

## 2019-11-04 DIAGNOSIS — I499 Cardiac arrhythmia, unspecified: Secondary | ICD-10-CM | POA: Diagnosis not present

## 2019-11-04 DIAGNOSIS — K449 Diaphragmatic hernia without obstruction or gangrene: Secondary | ICD-10-CM | POA: Diagnosis not present

## 2019-11-04 DIAGNOSIS — J449 Chronic obstructive pulmonary disease, unspecified: Secondary | ICD-10-CM | POA: Diagnosis present

## 2019-11-04 DIAGNOSIS — Z9981 Dependence on supplemental oxygen: Secondary | ICD-10-CM

## 2019-11-04 DIAGNOSIS — D62 Acute posthemorrhagic anemia: Secondary | ICD-10-CM

## 2019-11-04 DIAGNOSIS — N179 Acute kidney failure, unspecified: Secondary | ICD-10-CM | POA: Diagnosis present

## 2019-11-04 DIAGNOSIS — R0601 Orthopnea: Secondary | ICD-10-CM | POA: Diagnosis not present

## 2019-11-04 DIAGNOSIS — E440 Moderate protein-calorie malnutrition: Secondary | ICD-10-CM | POA: Diagnosis present

## 2019-11-04 DIAGNOSIS — K2970 Gastritis, unspecified, without bleeding: Secondary | ICD-10-CM

## 2019-11-04 DIAGNOSIS — I779 Disorder of arteries and arterioles, unspecified: Secondary | ICD-10-CM | POA: Diagnosis present

## 2019-11-04 DIAGNOSIS — K2991 Gastroduodenitis, unspecified, with bleeding: Secondary | ICD-10-CM | POA: Diagnosis not present

## 2019-11-04 DIAGNOSIS — Z955 Presence of coronary angioplasty implant and graft: Secondary | ICD-10-CM

## 2019-11-04 DIAGNOSIS — I2722 Pulmonary hypertension due to left heart disease: Secondary | ICD-10-CM | POA: Diagnosis present

## 2019-11-04 DIAGNOSIS — Z8673 Personal history of transient ischemic attack (TIA), and cerebral infarction without residual deficits: Secondary | ICD-10-CM

## 2019-11-04 DIAGNOSIS — K5731 Diverticulosis of large intestine without perforation or abscess with bleeding: Secondary | ICD-10-CM | POA: Diagnosis present

## 2019-11-04 DIAGNOSIS — Z20822 Contact with and (suspected) exposure to covid-19: Secondary | ICD-10-CM | POA: Diagnosis present

## 2019-11-04 DIAGNOSIS — Z7982 Long term (current) use of aspirin: Secondary | ICD-10-CM

## 2019-11-04 DIAGNOSIS — J9611 Chronic respiratory failure with hypoxia: Secondary | ICD-10-CM | POA: Diagnosis not present

## 2019-11-04 DIAGNOSIS — Z7951 Long term (current) use of inhaled steroids: Secondary | ICD-10-CM

## 2019-11-04 DIAGNOSIS — Z87891 Personal history of nicotine dependence: Secondary | ICD-10-CM

## 2019-11-04 DIAGNOSIS — Z7901 Long term (current) use of anticoagulants: Secondary | ICD-10-CM | POA: Diagnosis not present

## 2019-11-04 DIAGNOSIS — M545 Low back pain, unspecified: Secondary | ICD-10-CM

## 2019-11-04 DIAGNOSIS — D123 Benign neoplasm of transverse colon: Secondary | ICD-10-CM | POA: Diagnosis not present

## 2019-11-04 DIAGNOSIS — Z6823 Body mass index (BMI) 23.0-23.9, adult: Secondary | ICD-10-CM

## 2019-11-04 DIAGNOSIS — D649 Anemia, unspecified: Secondary | ICD-10-CM | POA: Diagnosis not present

## 2019-11-04 DIAGNOSIS — K2990 Gastroduodenitis, unspecified, without bleeding: Secondary | ICD-10-CM

## 2019-11-04 DIAGNOSIS — D126 Benign neoplasm of colon, unspecified: Secondary | ICD-10-CM

## 2019-11-04 DIAGNOSIS — I4891 Unspecified atrial fibrillation: Secondary | ICD-10-CM | POA: Diagnosis not present

## 2019-11-04 DIAGNOSIS — I110 Hypertensive heart disease with heart failure: Secondary | ICD-10-CM | POA: Diagnosis not present

## 2019-11-04 DIAGNOSIS — I4821 Permanent atrial fibrillation: Secondary | ICD-10-CM | POA: Diagnosis present

## 2019-11-04 DIAGNOSIS — R531 Weakness: Secondary | ICD-10-CM

## 2019-11-04 DIAGNOSIS — K319 Disease of stomach and duodenum, unspecified: Secondary | ICD-10-CM | POA: Diagnosis present

## 2019-11-04 DIAGNOSIS — E782 Mixed hyperlipidemia: Secondary | ICD-10-CM | POA: Diagnosis present

## 2019-11-04 DIAGNOSIS — I083 Combined rheumatic disorders of mitral, aortic and tricuspid valves: Secondary | ICD-10-CM | POA: Diagnosis present

## 2019-11-04 DIAGNOSIS — G459 Transient cerebral ischemic attack, unspecified: Secondary | ICD-10-CM | POA: Diagnosis not present

## 2019-11-04 DIAGNOSIS — R0600 Dyspnea, unspecified: Secondary | ICD-10-CM | POA: Diagnosis not present

## 2019-11-04 DIAGNOSIS — Z79899 Other long term (current) drug therapy: Secondary | ICD-10-CM

## 2019-11-04 DIAGNOSIS — Z8249 Family history of ischemic heart disease and other diseases of the circulatory system: Secondary | ICD-10-CM

## 2019-11-04 DIAGNOSIS — S0990XA Unspecified injury of head, initial encounter: Secondary | ICD-10-CM | POA: Diagnosis not present

## 2019-11-04 DIAGNOSIS — I739 Peripheral vascular disease, unspecified: Secondary | ICD-10-CM | POA: Diagnosis not present

## 2019-11-04 DIAGNOSIS — M47816 Spondylosis without myelopathy or radiculopathy, lumbar region: Secondary | ICD-10-CM | POA: Diagnosis not present

## 2019-11-04 DIAGNOSIS — R609 Edema, unspecified: Secondary | ICD-10-CM | POA: Diagnosis not present

## 2019-11-04 DIAGNOSIS — I5082 Biventricular heart failure: Secondary | ICD-10-CM | POA: Diagnosis not present

## 2019-11-04 HISTORY — DX: Benign neoplasm of colon, unspecified: D12.6

## 2019-11-04 LAB — COMPREHENSIVE METABOLIC PANEL
ALT: 20 U/L (ref 0–44)
AST: 29 U/L (ref 15–41)
Albumin: 2.9 g/dL — ABNORMAL LOW (ref 3.5–5.0)
Alkaline Phosphatase: 124 U/L (ref 38–126)
Anion gap: 9 (ref 5–15)
BUN: 46 mg/dL — ABNORMAL HIGH (ref 8–23)
CO2: 25 mmol/L (ref 22–32)
Calcium: 8.3 mg/dL — ABNORMAL LOW (ref 8.9–10.3)
Chloride: 99 mmol/L (ref 98–111)
Creatinine, Ser: 2.41 mg/dL — ABNORMAL HIGH (ref 0.61–1.24)
GFR calc Af Amer: 29 mL/min — ABNORMAL LOW (ref >60)
GFR calc non Af Amer: 25 mL/min — ABNORMAL LOW (ref >60)
Glucose, Bld: 115 mg/dL — ABNORMAL HIGH (ref 70–99)
Potassium: 5.2 mmol/L — ABNORMAL HIGH (ref 3.5–5.1)
Sodium: 133 mmol/L — ABNORMAL LOW (ref 135–145)
Total Bilirubin: 1.4 mg/dL — ABNORMAL HIGH (ref 0.3–1.2)
Total Protein: 5.6 g/dL — ABNORMAL LOW (ref 6.5–8.1)

## 2019-11-04 LAB — CBC WITH DIFFERENTIAL/PLATELET
Abs Immature Granulocytes: 0.02 10*3/uL (ref 0.00–0.07)
Basophils Absolute: 0 10*3/uL (ref 0.0–0.1)
Basophils Relative: 1 %
Eosinophils Absolute: 0.1 10*3/uL (ref 0.0–0.5)
Eosinophils Relative: 2 %
HCT: 21 % — ABNORMAL LOW (ref 39.0–52.0)
Hemoglobin: 7 g/dL — ABNORMAL LOW (ref 13.0–17.0)
Immature Granulocytes: 0 %
Lymphocytes Relative: 30 %
Lymphs Abs: 1.6 10*3/uL (ref 0.7–4.0)
MCH: 25.1 pg — ABNORMAL LOW (ref 26.0–34.0)
MCHC: 33.3 g/dL (ref 30.0–36.0)
MCV: 75.3 fL — ABNORMAL LOW (ref 80.0–100.0)
Monocytes Absolute: 0.5 10*3/uL (ref 0.1–1.0)
Monocytes Relative: 10 %
Neutro Abs: 3.1 10*3/uL (ref 1.7–7.7)
Neutrophils Relative %: 57 %
Platelets: 257 10*3/uL (ref 150–400)
RBC: 2.79 MIL/uL — ABNORMAL LOW (ref 4.22–5.81)
RDW: 17.2 % — ABNORMAL HIGH (ref 11.5–15.5)
WBC: 5.3 10*3/uL (ref 4.0–10.5)
nRBC: 2.4 % — ABNORMAL HIGH (ref 0.0–0.2)

## 2019-11-04 LAB — URINALYSIS, ROUTINE W REFLEX MICROSCOPIC
Bilirubin Urine: NEGATIVE
Glucose, UA: NEGATIVE mg/dL
Hgb urine dipstick: NEGATIVE
Ketones, ur: NEGATIVE mg/dL
Leukocytes,Ua: NEGATIVE
Nitrite: NEGATIVE
Protein, ur: NEGATIVE mg/dL
Specific Gravity, Urine: 1.011 (ref 1.005–1.030)
pH: 6 (ref 5.0–8.0)

## 2019-11-04 LAB — SARS CORONAVIRUS 2 (TAT 6-24 HRS): SARS Coronavirus 2: NEGATIVE

## 2019-11-04 LAB — BRAIN NATRIURETIC PEPTIDE: B Natriuretic Peptide: 1655.3 pg/mL — ABNORMAL HIGH (ref 0.0–100.0)

## 2019-11-04 MED ORDER — SODIUM CHLORIDE 0.9% FLUSH
3.0000 mL | Freq: Two times a day (BID) | INTRAVENOUS | Status: DC
  Administered 2019-11-04 – 2019-11-08 (×8): 3 mL via INTRAVENOUS

## 2019-11-04 MED ORDER — APIXABAN 5 MG PO TABS
5.0000 mg | ORAL_TABLET | Freq: Two times a day (BID) | ORAL | Status: DC
  Administered 2019-11-04: 22:00:00 5 mg via ORAL
  Filled 2019-11-04 (×2): qty 1

## 2019-11-04 MED ORDER — ACETAMINOPHEN 325 MG PO TABS
650.0000 mg | ORAL_TABLET | Freq: Four times a day (QID) | ORAL | Status: DC | PRN
  Administered 2019-11-04 – 2019-11-07 (×4): 650 mg via ORAL
  Filled 2019-11-04 (×4): qty 2

## 2019-11-04 MED ORDER — SODIUM CHLORIDE 0.9 % IV SOLN: 250.0000 mL | INTRAVENOUS | Status: DC | PRN

## 2019-11-04 MED ORDER — SODIUM CHLORIDE 0.9% FLUSH: 3.0000 mL | INTRAVENOUS | Status: DC | PRN

## 2019-11-04 MED ORDER — MOMETASONE FURO-FORMOTEROL FUM 200-5 MCG/ACT IN AERO
2.0000 | INHALATION_SPRAY | Freq: Two times a day (BID) | RESPIRATORY_TRACT | Status: DC
  Administered 2019-11-04 – 2019-11-08 (×8): 2 via RESPIRATORY_TRACT
  Filled 2019-11-04: qty 8.8

## 2019-11-04 MED ORDER — FUROSEMIDE 10 MG/ML IJ SOLN
40.0000 mg | Freq: Once | INTRAMUSCULAR | Status: AC
  Administered 2019-11-04: 15:00:00 40 mg via INTRAVENOUS
  Filled 2019-11-04: qty 4

## 2019-11-04 MED ORDER — ONDANSETRON HCL 4 MG/2ML IJ SOLN: 4.0000 mg | Freq: Four times a day (QID) | INTRAMUSCULAR | Status: DC | PRN

## 2019-11-04 MED ORDER — ALBUTEROL SULFATE (2.5 MG/3ML) 0.083% IN NEBU: 3.0000 mL | INHALATION_SOLUTION | Freq: Four times a day (QID) | RESPIRATORY_TRACT | Status: DC | PRN

## 2019-11-04 MED ORDER — FUROSEMIDE 10 MG/ML IJ SOLN
80.0000 mg | Freq: Two times a day (BID) | INTRAMUSCULAR | Status: DC
  Administered 2019-11-05 (×2): 80 mg via INTRAVENOUS
  Filled 2019-11-04 (×2): qty 8

## 2019-11-04 MED ORDER — ENSURE ENLIVE PO LIQD
237.0000 mL | Freq: Two times a day (BID) | ORAL | Status: DC
  Administered 2019-11-06 (×2): 237 mL via ORAL
  Filled 2019-11-04: qty 237

## 2019-11-04 MED ORDER — ISOSORBIDE DINITRATE 10 MG PO TABS
20.0000 mg | ORAL_TABLET | Freq: Three times a day (TID) | ORAL | Status: DC
  Administered 2019-11-04 – 2019-11-05 (×3): 20 mg via ORAL
  Filled 2019-11-04 (×3): qty 2
  Filled 2019-11-04: qty 1
  Filled 2019-11-04: qty 2
  Filled 2019-11-04: qty 1

## 2019-11-04 MED ORDER — ASPIRIN EC 81 MG PO TBEC
81.0000 mg | DELAYED_RELEASE_TABLET | Freq: Every day | ORAL | Status: DC
  Filled 2019-11-04: qty 1

## 2019-11-04 MED ORDER — ATORVASTATIN CALCIUM 80 MG PO TABS
80.0000 mg | ORAL_TABLET | Freq: Every day | ORAL | Status: DC
  Administered 2019-11-05 – 2019-11-08 (×4): 80 mg via ORAL
  Filled 2019-11-04 (×3): qty 1

## 2019-11-04 MED ORDER — HYDRALAZINE HCL 10 MG PO TABS
10.0000 mg | ORAL_TABLET | Freq: Two times a day (BID) | ORAL | Status: DC
  Administered 2019-11-04 – 2019-11-05 (×2): 10 mg via ORAL
  Filled 2019-11-04 (×2): qty 1

## 2019-11-04 MED ORDER — METOPROLOL SUCCINATE ER 50 MG PO TB24
150.0000 mg | ORAL_TABLET | Freq: Every day | ORAL | Status: DC
  Administered 2019-11-05: 150 mg via ORAL
  Filled 2019-11-04: qty 1

## 2019-11-04 NOTE — H&P (Signed)
History and Physical    Logan White JJH:[id number] DOB: 1941/02/17 DOA: 11/04/2019  PCP: Patient, No Pcp Per   Patient coming from: Home  I have personally briefly reviewed patient's old medical records in [place]  Chief Complaint: Right leg numb, pain  HPI: Logan White is a 79 y.o. male with medical history significant of chronic systolic CHF, hypertension, CVA, AAA, CKD stage III, chronic hypoxic respiratory failure on 2 L of oxygen on all time, presented to the ED via EMS with a chief complaint of right lower extremity weakness and pain. Patient has been having problem with the right leg for some time, he said sometimes he feels intermittent "pins-and-needles feeling" from side of right thigh to big toe, usually happens prolonged standing, and if he walks, within 3 to 5 minutes he will feel very heavy on the right leg, yesterday he had another episode which lasted approximately 1 hour. Today, while sitting on the toilet bowl, he felt the same sensation of his right leg and he was not able to stand up has to be carried by his son.  He Logan began to feel some shortness of breath, was supported into the ED.   ED Course: Checks x-ray showed pulmonary congestion  Review of Systems: As per HPI otherwise 10 point review of systems negative.    Past Medical History:  Diagnosis Date  . AAA (abdominal aortic aneurysm) ([place])    a. 08/2015 Abd U/S: 2.7 x 2.9 x 3.2 cm infrarenal AAA.  . Bradycardia    a. Requiring discontinuation of use of BB/CCB  . Cardiac arrest - ventricular fibrillation    a. 03/2012 in setting of hypokalemia (prolonged hosp with VDRF, tracheobronchitis, ARF, shock liver, PAF, AMS felt secondary to post-anoxic encephalopathy/shock).  . Carotid artery stenosis    a. 01/2011 - 40-59% bilateral stenosis;  b. 06/2015 Carotid U/S: 40-59% bilat ICA stenosis->f/u 6 mos.  . CKD (chronic kidney disease), stage III   . Coronary artery disease    a. s/p aborted ant  STEMI tx with Cypher DES to LAD 10/04 (residual at cath: D1 50%, CFX 40% and multiple dist 70%, EF 55%);   b. myoview 3/10: Ef 47%, infero-apical isch, LOW RISK - med Tx recommended;  c. 12/2012 VF Arrest/Cath: LAD 40isr, 80 apical, LCX 100 (failed PTCA), RCA nondom.  . Diabetes mellitus   . Diverticulosis 2001  . Elevated LFTs    Shock liver 03/2012  . H/O: CVA (cardiovascular accident)   . Hematemesis    a. 12/2010 felt 2/2 Mallory Weiss tear - pt could not afford colonscopy/EGD at that time so Coumadin was deferred. Coumadin initiated 03/2012 without any evidence for bleeding.  Marland Kitchen History of Ischemic Cardiomyopathy    a. EF 50-55% by echo 03/09/12 (was 30% by echo 02/24/12); b. 03/2013 Echo: EF 55-60%, mild MR, sev dil LA, mod dil RA, PASP 69mmHg.  Marland Kitchen Hypertensive heart disease    a. echo 4/12: EF 50%, asymmetric septal hypertrophy, no SAM or LVOT gradient, LAE, PASP 35  . Inguinal hernia   . 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 Mitral regurgitation    a. Mild by echo 03/2012 and 03/2013.  . Mixed Hyperlipidemia   . Peripheral vascular disease ([place])    a. h/o LE angioplasty;  b. 08/2015 Duplex: >50% RCIA, 100% REIA, >50% LEIA, elev vel in SMA, patent IVC.  Marland Kitchen Persistent atrial fibrillation ([place])    a. Dx 12/2010-->coumadin (CHA2DS2VASc = 7).  Marland Kitchen  QT prolongation     Past Surgical History:  Procedure Laterality Date  . HERNIA REPAIR    . LEFT HEART CATH N/A 12/30/2012   Procedure: LEFT HEART CATH;  Surgeon: [name], MD;  Location: [facility] CATH LAB;  Service: Cardiovascular;  Laterality: N/A;  . ORIF TIBIA & FIBULA FRACTURES  01/11/07   OPEN TX OF UNICONDYLAR PLATEAU FRACTURE, IRRIGATION/DEBRIDEMENT OF OPEN FRACTURE INCLUDING BONE, REMOVAL OF EXTERNAL FIXATOR UNDER ANESTHESIA PER DR. MICHAEL HANDY  . PERIPHERAL VASCULAR CATHETERIZATION N/A 12/21/2015   Procedure: Lower Extremity Angiography;  Surgeon: [name], MD;  Location: [place] CV LAB;  Service:  Cardiovascular;  Laterality: N/A;  . POPLITEAL ARTERY ANGIOPLASTY  01/07/07   s/p LEFT POPLITEAL ARTERY EXPLORATION AND VEIN PATCH ANGIOPLASTY, PER DR. EARLY, SECONDARY TO ISCHEMIC LEFT FOOT RELATED TO LEFT POPLITEAL ARTERY INJURY  . RIGHT HEART CATH N/A 10/02/2019   Procedure: RIGHT HEART CATH;  Surgeon: [name], MD;  Location: [place] CV LAB;  Service: Cardiovascular;  Laterality: N/A;  . SKIN GRAFT    . TEE WITHOUT CARDIOVERSION N/A 08/08/2019   Procedure: TRANSESOPHAGEAL ECHOCARDIOGRAM (TEE);  Surgeon: Lelon Perla, MD;  Location: [facility] ENDOSCOPY;  Service: Cardiovascular;  Laterality: N/A;     reports that he quit smoking about 7 years ago. His smoking use included cigarettes. He has a 15.00 pack-year smoking history. He has never used smokeless tobacco. He reports that he does not drink alcohol or use drugs.  No Known Allergies  Family History  Problem Relation Age of Onset  . Heart disease Father        Logan White  . Heart failure Father   . Brain cancer Mother   . Leukemia Mother   . Sickle cell anemia Mother   . Sickle cell anemia Other      Prior to Admission medications   Medication Sig Start Date End Date Taking? Authorizing Provider  acetaminophen (TYLENOL) 325 MG tablet Take 650 mg by mouth every 6 (six) hours as needed for headache.   Yes [provider]  albuterol (VENTOLIN HFA) 108 (90 Base) MCG/ACT inhaler Inhale 2 puffs into the lungs every 6 (six) hours as needed for wheezing or shortness of breath. 08/26/19  Yes Lelon Perla, MD  apixaban (ELIQUIS) 5 MG TABS tablet Take 1 tablet (5 mg total) by mouth 2 (two) times daily. 10/06/19 12/05/19 Yes Kayleen Memos, DO  aspirin EC 81 MG EC tablet Take 1 tablet (81 mg total) by mouth daily. 10/07/19 12/06/19 Yes Hall, Carole N, DO  atorvastatin (LIPITOR) 80 MG tablet Take 80 mg by mouth daily.   Yes [provider]  Ensure (ENSURE) Take 237 mLs by mouth 2 (two) times daily as needed  (nutritional supplement).   Yes [provider]  Fluticasone-Salmeterol (ADVAIR DISKUS) 250-50 MCG/DOSE AEPB Inhale 1 puff into the lungs 2 (two) times daily for 14 days. Patient taking differently: Inhale 1 puff into the lungs 2 (two) times daily as needed (shortness of breath).  10/06/19 11/04/19 Yes Kayleen Memos, DO  hydrALAZINE (APRESOLINE) 10 MG tablet Take 1 tablet (10 mg total) by mouth 2 (two) times daily. 09/23/19 12/22/19 Yes Verta Ellen., NP  isosorbide dinitrate (ISORDIL) 20 MG tablet Take 1 tablet (20 mg total) by mouth 3 (three) times daily. 10/06/19 12/05/19 Yes Kayleen Memos, DO  metoprolol succinate (TOPROL-XL) 50 MG 24 hr tablet Take 3 tablets (150 mg total) by mouth daily. Take  with or immediately following a meal. 10/07/19 12/06/19 Yes Hall, Carole N, DO  nitroGLYCERIN (NITROSTAT) 0.4 MG SL tablet Place 1 tablet (0.4 mg total) under the tongue every 5 (five) minutes as needed for chest pain (Up to 3 doses). 10/22/19  Yes Theora Gianotti, NP  potassium chloride SA (KLOR-CON) 20 MEQ tablet Take 1 tablet (20 mEq total) by mouth 2 (two) times daily. 10/06/19 12/05/19 Yes Kayleen Memos, DO  torsemide (DEMADEX) 20 MG tablet Take 3 tablets (60 mg total) by mouth 2 (two) times daily. 10/06/19 12/05/19 Yes Kayleen Memos, DO    Physical Exam: Vitals:   11/04/19 1720 11/04/19 1740 11/04/19 1745 11/04/19 1827  BP:  103/61  105/61  Pulse:      Resp: 10 19 (!) 21   Temp:  97.8 F (36.6 C)    TempSrc:  Oral    SpO2:  98%    Weight:  81.7 kg    Height:  6\' 3"  (1.905 m)      Constitutional: NAD, calm, comfortable Vitals:   11/04/19 1720 11/04/19 1740 11/04/19 1745 11/04/19 1827  BP:  103/61  105/61  Pulse:      Resp: 10 19 (!) 21   Temp:  97.8 F (36.6 C)    TempSrc:  Oral    SpO2:  98%    Weight:  81.7 kg    Height:  6\' 3"  (1.905 m)     Eyes: PERRL, lids and conjunctivae normal ENMT: Mucous membranes are moist. Posterior pharynx clear of any exudate or  lesions.Normal dentition.  Neck: normal, supple, no masses, no thyromegaly Respiratory: clear to auscultation bilaterally, no wheezing, no crackles. Normal respiratory effort. No accessory muscle use.  Cardiovascular: Irregular heartbeat, soft murmur on heart base.  2+ edema both lower upper shin and lower thigh.  Diminished bilateral pedal pulses. No carotid bruits.  Abdomen: no tenderness, no masses palpated. No hepatosplenomegaly. Bowel sounds positive.  Musculoskeletal: no clubbing / cyanosis. No joint deformity upper and lower extremities. Good ROM, no contractures. Normal muscle tone.  Skin: no rashes, lesions, ulcers. No induration Neurologic: CN 2-12 grossly intact. Sensation intact, DTR normal. Strength 5/5 in all 4.  Psychiatric: Normal judgment and insight. Alert and oriented x 3. Normal mood.     Labs on Admission: I have personally reviewed following labs and imaging studies  CBC: No results for input(s): WBC, NEUTROABS, HGB, HCT, MCV, PLT in the last 168 hours. Basic Metabolic Panel: Recent Labs  Lab 10/29/19 1139 11/04/19 1241  NA 139 133*  K 4.2 5.2*  CL 101 99  CO2 22 25  GLUCOSE 106* 115*  BUN 37* 46*  CREATININE 2.09* 2.41*  CALCIUM 8.9 8.3*   GFR: Estimated Creatinine Clearance: 30.1 mL/min (A) (by C-G formula based on SCr of 2.41 mg/dL (H)). Liver Function Tests: Recent Labs  Lab 11/04/19 1241  AST 29  ALT 20  ALKPHOS 124  BILITOT 1.4*  PROT 5.6*  ALBUMIN 2.9*   No results for input(s): LIPASE, AMYLASE in the last 168 hours. No results for input(s): AMMONIA in the last 168 hours. Coagulation Profile: No results for input(s): INR, PROTIME in the last 168 hours. Cardiac Enzymes: No results for input(s): CKTOTAL, CKMB, CKMBINDEX, TROPONINI in the last 168 hours. BNP (last 3 results) Recent Labs    08/15/19 0949  PROBNP 16,511*   HbA1C: No results for input(s): HGBA1C in the last 72 hours. CBG: No results for input(s): GLUCAP in the last 168  hours.  Lipid Profile: No results for input(s): CHOL, HDL, LDLCALC, TRIG, CHOLHDL, LDLDIRECT in the last 72 hours. Thyroid Function Tests: No results for input(s): TSH, T4TOTAL, FREET4, T3FREE, THYROIDAB in the last 72 hours. Anemia Panel: No results for input(s): VITAMINB12, FOLATE, FERRITIN, TIBC, IRON, RETICCTPCT in the last 72 hours. Urine analysis:    Component Value Date/Time   COLORURINE YELLOW 11/04/2019 1500   APPEARANCEUR CLEAR 11/04/2019 1500   LABSPEC 1.011 11/04/2019 1500   PHURINE 6.0 11/04/2019 1500   GLUCOSEU NEGATIVE 11/04/2019 1500   HGBUR NEGATIVE 11/04/2019 1500   BILIRUBINUR NEGATIVE 11/04/2019 1500   KETONESUR NEGATIVE 11/04/2019 1500   PROTEINUR NEGATIVE 11/04/2019 1500   UROBILINOGEN 0.2 03/23/2013 1740   NITRITE NEGATIVE 11/04/2019 1500   LEUKOCYTESUR NEGATIVE 11/04/2019 1500    Radiological Exams on Admission: DG Lumbar Spine 2-3 Views  Result Date: 11/04/2019 CLINICAL DATA:  Right lower extremity weakness EXAM: LUMBAR SPINE - 2-3 VIEW COMPARISON:  None. FINDINGS: Sagittal alignment is within normal limits. Vertebral body heights are grossly maintained. Moderate severe degenerative change at L5-S1. Mild osteophytes at most levels throughout the lumbar spine. Dense aortic atherosclerosis. Ectatic distal abdominal aorta measuring up to 3.2 cm. IMPRESSION: 1. No acute osseous abnormality. 2. Degenerative changes most advanced at L5-S1 3. Dense aortic atherosclerosis. AP diameter of calcified distal abdominal aorta up to 3.2 cm. This may be more thoroughly evaluated with CT, which could be performed on a nonemergent basis unless otherwise clinically indicated. Electronically Signed   By: Donavan Foil M.D.   On: 11/04/2019 17:03   CT Head Wo Contrast  Result Date: 11/04/2019 CLINICAL DATA:  The patient's legs gave way today but the patient did not fall. Initial encounter. EXAM: CT HEAD WITHOUT CONTRAST TECHNIQUE: Contiguous axial images were obtained from the base of  the skull through the vertex without intravenous contrast. COMPARISON:  Head CT scan 09/28/2019. FINDINGS: Brain: No evidence of acute infarction, hemorrhage, hydrocephalus, extra-axial collection or mass lesion/mass effect. Atrophy and chronic microvascular ischemic change noted. Vascular: Atherosclerosis. Skull: Intact. No focal lesion. Sinuses/Orbits: Negative. Other: None. IMPRESSION: 1. No acute abnormality. 2. Atrophy and chronic microvascular ischemic change. 3. Atherosclerosis. Electronically Signed   By: Inge Rise M.D.   On: 11/04/2019 14:26   DG Chest Portable 1 View  Result Date: 11/04/2019 CLINICAL DATA:  Right lower extremity weakness and numbness last night for 30 minutes and again today at 10:30 this morning. EXAM: PORTABLE CHEST 1 VIEW COMPARISON:  Single-view of the chest 09/28/2019. PA and lateral chest 06/22/2019. FINDINGS: There is cardiomegaly and pulmonary edema. Small bilateral pleural effusions. No pneumothorax. Atherosclerosis. IMPRESSION: Congestive heart failure. Atherosclerosis. Electronically Signed   By: Inge Rise M.D.   On: 11/04/2019 12:53    EKG: Pending  Assessment/Plan Active Problems:   CHF (congestive heart failure) (HCC)  Right lower extremity numbness/pain and claudication Low suspicion for CVA given the patient already on aspirin and Eliquis To this episode more likely a sciatica, but patient denied any chronic back pain Lumbar x-ray showed some degeneration of L5-S1 symptoms indicating sciatica is 1 differential especially given patient Logan has claudication, however PVD with a vascular claudication should be Logan be considered, which strongly evidenced by the patient's lumbar x-ray showing severely calcified descending aorta, I ordered a ABI to stratify risk.  Acute on chronic CHF decompensation systolic Discussed with patient's cardiology, there is evidence of fluid overload despite patient on high-dose of torsemide at home.  Patient blood  pressure is borderline, seems asymptomatic, agreed with  aggressive diuresis as tolerated.  AKI on CKD stage III Suspect cardiorenal syndrome, with evidence of fluid overload, aggressive diuresis for now.  Hyperkalemia Secondary to AKI, will stop potassium supplement  Chronic hypoxic respite failure Continue oxygen support  Moderate pulmonary hypertension Diuresis  DVT prophylaxis: Continue Eliquis Code Status: Full code Family Communication: Patient's wife Disposition Plan: Depends on CHF treatment and right leg symptoms improvement Consults called: Cardiology Dr. [name] Admission status: Telemetry admission   Lequita Halt MD [facility] Pager [phone]    11/04/2019, 6:39 PM

## 2019-11-04 NOTE — ED Provider Notes (Signed)
[facility] EMERGENCY DEPARTMENT Provider Note   CSN: [id number] Arrival date & time: 11/04/19  1214     History Chief Complaint  Patient presents with   Extremity Weakness   Irregular Heart Beat    Logan White is a 79 y.o. male.  79 y.o male with a PMH of DM, CVA, AAA, CKD presents to the ED via EMS with a chief complaint of lower extremity weakness and new onset Afib x yesterday. Patient reports his symptoms began yesterday, states he was lying down when he suddenly felt right leg numbness, reports he felt "like my leg died on me ", the symptoms lasted for approximately 1 hour.  Today, while attempting to ambulate to the bathroom, he felt the same sensation of his right leg no limp.  He also began to feel some shortness of breath, was supported into the ED.  No medical therapy for improvement in his symptoms, no higher episode similar to this 1.  Of note, patient is currently on home oxygen at 2 L for his history of COPD.  No fevers, weakness, S pain.  No prior history of A. fib.  The history is provided by the patient.       Past Medical History:  Diagnosis Date   AAA (abdominal aortic aneurysm) ([place])    a. 08/2015 Abd U/S: 2.7 x 2.9 x 3.2 cm infrarenal AAA.   Bradycardia    a. Requiring discontinuation of use of BB/CCB   Cardiac arrest - ventricular fibrillation    a. 03/2012 in setting of hypokalemia (prolonged hosp with VDRF, tracheobronchitis, ARF, shock liver, PAF, AMS felt secondary to post-anoxic encephalopathy/shock).   Carotid artery stenosis    a. 01/2011 - 40-59% bilateral stenosis;  b. 06/2015 Carotid U/S: 40-59% bilat ICA stenosis->f/u 6 mos.   CKD (chronic kidney disease), stage III    Coronary artery disease    a. s/p aborted ant STEMI tx with Cypher DES to LAD 10/04 (residual at cath: D1 50%, CFX 40% and multiple dist 70%, EF 55%);   b. myoview 3/10: Ef 47%, infero-apical isch, LOW RISK - med Tx recommended;  c. 12/2012 VF  Arrest/Cath: LAD 40isr, 80 apical, LCX 100 (failed PTCA), RCA nondom.   Diabetes mellitus    Diverticulosis 2001   Elevated LFTs    Shock liver 03/2012   H/O: CVA (cardiovascular accident)    Hematemesis    a. 12/2010 felt 2/2 Mallory Weiss tear - pt could not afford colonscopy/EGD at that time so Coumadin was deferred. Coumadin initiated 03/2012 without any evidence for bleeding.   History of Ischemic Cardiomyopathy    a. EF 50-55% by echo 03/09/12 (was 30% by echo 02/24/12); b. 03/2013 Echo: EF 55-60%, mild MR, sev dil LA, mod dil RA, PASP 30mmHg.   Hypertensive heart disease    a. 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 Mild by echo 03/2012 and 03/2013.   Mixed Hyperlipidemia    Peripheral vascular disease ([place])    a. h/o LE angioplasty;  b. 08/2015 Duplex: >50% RCIA, 100% REIA, >50% LEIA, elev vel in SMA, patent IVC.   Persistent atrial fibrillation ([place])    a. Dx 12/2010-->coumadin (CHA2DS2VASc = 7).   QT prolongation     Patient Active Problem List   Diagnosis Date Noted   CHF (congestive heart  failure), NYHA class IV, acute, systolic (HCC) 02/26/7627   Persistent atrial fibrillation (HCC)    Acute on chronic systolic CHF (congestive heart failure) ([place]) 06/22/2019   Acute on chronic systolic (congestive) heart failure ([place]) 06/22/2019   CAD S/P PCI 10/13/2015   Cardiomyopathy, new. Etiology apeears to be NICM 10/13/2015   AAA (abdominal aortic aneurysm) ([place])    Peripheral vascular disease ([place])    Carotid artery stenosis    Hypertensive heart disease    Bruit 10/24/2014   Acute on chronic renal failure ([place]) 01/11/2013   Acute MI, anterolateral wall, initial episode of care ([place]) 12/31/2012   Cardiac arrest due to underlying cardiac condition ([place]) 12/31/2012   VF (ventricular  fibrillation) ([place]) 12/31/2012   Abnormal LFTs 12/31/2012   Acute on chronic renal insufficiency 12/31/2012   Chronic anticoagulation 12/31/2012   Hypokalemia 12/31/2012   Fatigue 04/23/2012   Long term (current) use of anticoagulants 03/19/2012   CKD (chronic kidney disease), stage III 03/16/2012   Acute confusion/delerium 03/12/2012   Delirium 31/51/7616   Acute systolic CHF (congestive heart failure) ([place]) 03/11/2012   Septic shock(785.52) 02/27/2012    Class: Acute   Cardiogenic shock ([place]) 02/25/2012   Acute renal failure ([place]) 02/25/2012   Anoxic encephalopathy ([place]) 02/25/2012   Ischemic cardiomyopathy 02/25/2012   Cardiac arrest ([place]) 02/23/2012   Acute respiratory failure with hypoxia ([place]) 02/23/2012   Ventricular fibrillation ([place]) 02/23/2012   DM (diabetes mellitus) ([place]) 02/23/2012   Screening for colon cancer 02/10/2011   Hematemesis 01/13/2011   Dyspnea 01/13/2011   RENAL INSUFFICIENCY 08/26/2010   HYPERPOTASSEMIA 09/15/2009   TOBACCO ABUSE 08/11/2009   GLUCOSE INTOLERANCE 10/02/2008   Hyperlipidemia 10/02/2008   Essential hypertension 10/02/2008   Cerebrovascular disease 10/02/2008   PERIPHERAL VASCULAR DISEASE 10/02/2008   INGUINAL HERNIA 10/02/2008    Past Surgical History:  Procedure Laterality Date   HERNIA REPAIR     LEFT HEART CATH N/A 12/30/2012   Procedure: LEFT HEART CATH;  Surgeon: [name], MD;  Location: [facility] CATH LAB;  Service: Cardiovascular;  Laterality: N/A;   ORIF TIBIA & FIBULA FRACTURES  01/11/07   OPEN TX OF UNICONDYLAR PLATEAU FRACTURE, IRRIGATION/DEBRIDEMENT OF OPEN FRACTURE INCLUDING BONE, REMOVAL OF EXTERNAL FIXATOR UNDER ANESTHESIA PER DR. MICHAEL HANDY   PERIPHERAL VASCULAR CATHETERIZATION N/A 12/21/2015   Procedure: Lower Extremity Angiography;  Surgeon: [name], MD;  Location: [place] CV LAB;  Service: Cardiovascular;  Laterality: N/A;   POPLITEAL ARTERY ANGIOPLASTY  01/07/07   s/p  LEFT POPLITEAL ARTERY EXPLORATION AND VEIN PATCH ANGIOPLASTY, PER DR. EARLY, SECONDARY TO ISCHEMIC LEFT FOOT RELATED TO LEFT POPLITEAL ARTERY INJURY   RIGHT HEART CATH N/A 10/02/2019   Procedure: RIGHT HEART CATH;  Surgeon: [name], MD;  Location: [facility] CV LAB;  Service: Cardiovascular;  Laterality: N/A;   SKIN GRAFT     TEE WITHOUT CARDIOVERSION N/A 08/08/2019   Procedure: TRANSESOPHAGEAL ECHOCARDIOGRAM (TEE);  Surgeon: Lelon Perla, MD;  Location: [facility] ENDOSCOPY;  Service: Cardiovascular;  Laterality: N/A;       Family History  Problem Relation Age of Onset   Heart disease Father        ALSO UNCLE DIED HAD CAD   Heart failure Father    Brain cancer Mother    Leukemia Mother    Sickle cell anemia Mother    Sickle cell anemia Other     Social History   Tobacco Use   Smoking status: Former Smoker    Packs/day: 0.50  Years: 30.00    Pack years: 15.00    Types: Cigarettes    Quit date: 03/12/2012    Years since quitting: 7.6   Smokeless tobacco: Never Used  Substance Use Topics   Alcohol use: No    Alcohol/week: 0.0 standard drinks   Drug use: No    Frequency: 2.0 times per week    Home Medications Prior to Admission medications   Medication Sig Start Date End Date Taking? Authorizing Provider  acetaminophen (TYLENOL) 325 MG tablet Take 650 mg by mouth every 6 (six) hours as needed for headache.   Yes [provider]  albuterol (VENTOLIN HFA) 108 (90 Base) MCG/ACT inhaler Inhale 2 puffs into the lungs every 6 (six) hours as needed for wheezing or shortness of breath. 08/26/19  Yes Lelon Perla, MD  apixaban (ELIQUIS) 5 MG TABS tablet Take 1 tablet (5 mg total) by mouth 2 (two) times daily. 10/06/19 12/05/19 Yes Kayleen Memos, DO  aspirin EC 81 MG EC tablet Take 1 tablet (81 mg total) by mouth daily. 10/07/19 12/06/19 Yes Hall, Carole N, DO  atorvastatin (LIPITOR) 80 MG tablet Take 80 mg by mouth daily.   Yes [provider]    Ensure (ENSURE) Take 237 mLs by mouth 2 (two) times daily as needed (nutritional supplement).   Yes [provider]  Fluticasone-Salmeterol (ADVAIR DISKUS) 250-50 MCG/DOSE AEPB Inhale 1 puff into the lungs 2 (two) times daily for 14 days. Patient taking differently: Inhale 1 puff into the lungs 2 (two) times daily as needed (shortness of breath).  10/06/19 11/04/19 Yes Kayleen Memos, DO  hydrALAZINE (APRESOLINE) 10 MG tablet Take 1 tablet (10 mg total) by mouth 2 (two) times daily. 09/23/19 12/22/19 Yes Verta Ellen., NP  isosorbide dinitrate (ISORDIL) 20 MG tablet Take 1 tablet (20 mg total) by mouth 3 (three) times daily. 10/06/19 12/05/19 Yes Kayleen Memos, DO  metoprolol succinate (TOPROL-XL) 50 MG 24 hr tablet Take 3 tablets (150 mg total) by mouth daily. Take with or immediately following a meal. 10/07/19 12/06/19 Yes Hall, Carole N, DO  nitroGLYCERIN (NITROSTAT) 0.4 MG SL tablet Place 1 tablet (0.4 mg total) under the tongue every 5 (five) minutes as needed for chest pain (Up to 3 doses). 10/22/19  Yes Theora Gianotti, NP  potassium chloride SA (KLOR-CON) 20 MEQ tablet Take 1 tablet (20 mEq total) by mouth 2 (two) times daily. 10/06/19 12/05/19 Yes Kayleen Memos, DO  torsemide (DEMADEX) 20 MG tablet Take 3 tablets (60 mg total) by mouth 2 (two) times daily. 10/06/19 12/05/19 Yes Kayleen Memos, DO    Allergies    Patient has no known allergies.  Review of Systems   Review of Systems  Constitutional: Negative for fever.  HENT: Negative for sore throat.   Respiratory: Positive for cough and shortness of breath.   Cardiovascular: Negative for chest pain and palpitations.  Gastrointestinal: Negative for abdominal pain, nausea and vomiting.  Musculoskeletal: Negative for back pain and myalgias.  Skin: Negative for pallor and wound.  Neurological: Positive for weakness.  Psychiatric/Behavioral: Negative for confusion.  All other systems reviewed and are negative.   Physical  Exam Updated Vital Signs BP 127/63    Pulse (!) 57    Temp (!) 97 F (36.1 C) (Oral)    Resp (!) 23    Ht 6\' 3"  (1.905 m)    Wt 77.1 kg    SpO2 99%    BMI 21.25 kg/m  Physical Exam Vitals and nursing note reviewed.  Constitutional:      Appearance: He is ill-appearing.  HENT:     Head: Normocephalic and atraumatic.     Nose: Nose normal.     Mouth/Throat:     Mouth: Mucous membranes are moist.  Eyes:     Pupils: Pupils are equal, round, and reactive to light.  Cardiovascular:     Rate and Rhythm: Normal rate.     Comments: No bilateral pitting edema.  No calf tenderness. Pulmonary:     Effort: Pulmonary effort is normal.     Breath sounds: Examination of the left-upper field reveals rales. Examination of the left-middle field reveals rales. Examination of the left-lower field reveals rales. Rales present.     Comments: Lungs with significant crackles to all left lung fields.  Normal breath sounds on right lung. Abdominal:     General: Abdomen is flat.     Tenderness: There is no abdominal tenderness. There is no right CVA tenderness or left CVA tenderness.  Musculoskeletal:     Cervical back: Normal range of motion and neck supple.     Right lower leg: No edema.     Left lower leg: No edema.  Neurological:     Mental Status: He is alert and oriented to person, place, and time.     Motor: No weakness.     Comments: Alert, oriented, thought content appropriate. Speech fluent without evidence of aphasia. Able to follow 2 step commands without difficulty.  Cranial Nerves:  II:  Peripheral visual fields grossly normal, pupils, round, reactive to light III,IV, VI: ptosis not present, extra-ocular motions intact bilaterally  V,VII: smile symmetric, facial light touch sensation equal VIII: hearing grossly normal bilaterally  IX,X: midline uvula rise  XI: bilateral shoulder shrug equal and strong XII: midline tongue extension  Motor:  5/5 in upper and lower extremities bilaterally  including strong and equal grip strength and dorsiflexion/plantar flexion Sensory: light touch normal in all extremities.  Cerebellar: normal finger-to-nose with bilateral upper extremities, pronator drift negative      ED Results / Procedures / Treatments   Labs (all labs ordered are listed, but only abnormal results are displayed) Labs Reviewed  BRAIN NATRIURETIC PEPTIDE - Abnormal; Notable for the following components:      Result Value   B Natriuretic Peptide 1,655.3 (*)    All other components within normal limits  COMPREHENSIVE METABOLIC PANEL - Abnormal; Notable for the following components:   Sodium 133 (*)    Potassium 5.2 (*)    Glucose, Bld 115 (*)    BUN 46 (*)    Creatinine, Ser 2.41 (*)    Calcium 8.3 (*)    Total Protein 5.6 (*)    Albumin 2.9 (*)    Total Bilirubin 1.4 (*)    GFR calc non Af Amer 25 (*)    GFR calc Af Amer 29 (*)    All other components within normal limits  CBC WITH DIFFERENTIAL/PLATELET  URINALYSIS, ROUTINE W REFLEX MICROSCOPIC    EKG None  Radiology CT Head Wo Contrast  Result Date: 11/04/2019 CLINICAL DATA:  The patient's legs gave way today but the patient did not fall. Initial encounter. EXAM: CT HEAD WITHOUT CONTRAST TECHNIQUE: Contiguous axial images were obtained from the base of the skull through the vertex without intravenous contrast. COMPARISON:  Head CT scan 09/28/2019. FINDINGS: Brain: No evidence of acute infarction, hemorrhage, hydrocephalus, extra-axial collection or mass lesion/mass effect. Atrophy and chronic microvascular  ischemic change noted. Vascular: Atherosclerosis. Skull: Intact. No focal lesion. Sinuses/Orbits: Negative. Other: None. IMPRESSION: 1. No acute abnormality. 2. Atrophy and chronic microvascular ischemic change. 3. Atherosclerosis. Electronically Signed   By: Inge Rise M.D.   On: 11/04/2019 14:26   DG Chest Portable 1 View  Result Date: 11/04/2019 CLINICAL DATA:  Right lower extremity weakness and  numbness last night for 30 minutes and again today at 10:30 this morning. EXAM: PORTABLE CHEST 1 VIEW COMPARISON:  Single-view of the chest 09/28/2019. PA and lateral chest 06/22/2019. FINDINGS: There is cardiomegaly and pulmonary edema. Small bilateral pleural effusions. No pneumothorax. Atherosclerosis. IMPRESSION: Congestive heart failure. Atherosclerosis. Electronically Signed   By: Inge Rise M.D.   On: 11/04/2019 12:53    Procedures Procedures (including critical care time)  Medications Ordered in ED Medications  furosemide (LASIX) injection 40 mg (40 mg Intravenous Given 11/04/19 1459)    ED Course  I have reviewed the triage vital signs and the nursing notes.  Pertinent labs & imaging results that were available during my care of the patient were reviewed by me and considered in my medical decision making (see chart for details).  Clinical Course as of Nov 04 1503  Mon Nov 04, 2019  1457 B Natriuretic Peptide(!): 1,655.3 [JS]  1457 Potassium(!): 5.2 [JS]    Clinical Course User Index [JS] Janeece Fitting, PA-C   MDM Rules/Calculators/A&P                       With a past medical history of CKD, CVA, hypertension presents to the ED with complaints of right leg weakness since yesterday along with shortness of breath.  Reports 2 different episodes, yesterday's episode lasted 30 minutes where he felt like his leg "died on me ".  He also reports this episode occurred again today, stating did not have any feeling to his right leg when attempting to ambulate to the bathroom and had to call her son for help.  He also endorses shortness of breath, he is currently on home oxygen on 2 L due to his COPD, does report some worsening symptoms.  Although no fever, cough.  During my evaluation patient is conically ill-appearing, neuro exam is reassuring without any weakness on lower extremities.  No focal neuro deficit noted on my exam.  Does have audible crackles on the left lung fields, right  lung fields are preserved.  Does report use of his inhaler although he has not done so in the past few weeks as he has states he has not left the house.  He arrived in the ED normotensive, heart rate is 57, EKG was found to be on new onset A. fib, does not have a prior history of this per patient.  However after extensive review of his charts, I do see a history of permanent A. fib listed on his office visits by cardiology, he is followed by Dr. [name] of cardiology.  Satting at 99% on room air.  With some mild tachypnea at 23.  Interpretation of his laboratory results included a CMP with a sodium of 133, potassium of 5.2, creatinine level has worsened to his previous visit at 2.4.  His BNP today is 1, 655.3.  Some suspicion for exacerbation of his heart failure. Provided with 40 mg of Lasix IV. CT of his head without any acute pathology.  X-ray of his chest revealed some volume overload, consistent with his prior diagnosis of heart failure with some pulmonary edema.  Patient  was ambulate by nursing staff without assistance around nursing station.   I have discussed case with Dr. Johnney Killian who agrees with plan and management.   3:04 PM Spoke to hospitalist Dr. Roosevelt Locks who will admit patient for exacerbation of heart failure.    Portions of this note were generated with [employer]. Dictation errors may occur despite best attempts at proofreading.  Final Clinical Impression(s) / ED Diagnoses Final diagnoses:  Weakness  Shortness of breath  Acute on chronic systolic (congestive) heart failure [facility])    Rx / DC Orders ED Discharge Orders    None       Janeece Fitting, PA-C 11/04/19 1505    Charlesetta Shanks, MD 11/05/19 [phone]

## 2019-11-04 NOTE — ED Notes (Signed)
Pt. Ambulated around nursing station with steady gait; pt stated his legs feeling tired when he finished walking

## 2019-11-04 NOTE — ED Triage Notes (Signed)
Pt from home via [facility]. States had one episode of R LE weakness and numbness last night that lasted about 30 minutes and then today at about 1030 experienced the same thing. Pt was found to be in Afib which he denies any known hx of.

## 2019-11-04 NOTE — Consult Note (Addendum)
Cardiology Consultation:   Patient ID: Logan White MRN: [id number]; DOB: June 09, 1941  Admit date: 11/04/2019 Date of Consult: 11/04/2019  Primary Care Provider: Patient, No Pcp Per Primary Cardiologist: Kirk Ruths, MD  Primary Electrophysiologist:  None    Patient Profile:   Logan White is a 79 y.o. male with a hx of permanent Afib, CAD, Vfib arrest, ICM and CHF who is being seen today for the evaluation of CHF/afib at the request of Dr. Dorothyann Gibbs.  History of Present Illness:   Logan White is a 79 yo male with PMH noted above. He is followed by Dr. [name] as an outpatient. He underwent PCI to the LAD in 10/04. Admitted in June of 2013 after a Vfib arrest which was felt to be related to severe hypokalemia. Cardiac cath in April 2014 a patent stent in the LAD with 40% ISR, apical LAD lesion of 80%, occluded LCx. Attempted CTO PCI of the left Lcx which was unsuccessful. Echo at that time showed an EF of 30-35% with mild reduced RV function. He was evaluated Dr. Lovena Le for possible ICD but deferred given his acute NSTEMI. He was discharged with a lifevest.   Myoview in 1/17 showed a fixed inferior defect but no ischemia. PV angiogram in 4/17 showed an occluded right external iliac and common femoral artery which was not amenable to PCI.   Admitted in Nov 2020 with lower extremity edema and shortness of breath. Treated with IV lasix. Echo showed an EF of 30-35% with severe MR, and moderate AI. He was seen back in the office for follow up with Logan White shortly afterwards and ICD was discussed again but patient did not wish to pursue. Was agreeable to MR work up and underwent TEE which showed  EF 25 to 30%, biatrial enlargement, moderate MR, mild tricuspid regurgitation. He was later seen in the office for follow up and suspected his breathing issues my be related to underlying COPD and was referred to pulmonary.    He was readmitted on 09/28/19 with exertional dyspnea with possible syncope.  He underwent a right heart cath on 10/02/2019, this revealed moderate pulmonary hypertension with mean pressure of 42 mmHg, elevated mean pulmonary wedge pressure of 20 mmHg, cardiac output 4.24, cardiac index 2.0, pulmonary artery pulsatility index of 1.7 consistent with right heart failure. He underwent aggressive IV diuresis and net removal of 2 L of fluid. Discharged on Demadex 60mg  BID along with outpatient cardiac monitor.   He was last seen in the office on 10/29/19 where he reported being set up with home O2. Also reported worsening right thigh pain with walking. Planned for outpatient ABIs and follow up with Dr. Gwenlyn Found.   Presented to the ED on 11/04/19 with lower extremity weakness and shortness of breath. States last night he had a episode walking to the bathroom where his right leg "felt dead". This lasted for about an hour then resolved. Had another episode this morning. States his breathing worsened this morning as well. Has been complaint with "all the meds my wife gives me". Also reports compliance with diet and fluid intake.   In the ED his labs showed Na+ 133, K+ 5.2, Cr 2.41, BNP 1655. CXR with pulmonary edema. No EKG noted at the time of assessment but review of telemetry shows Afib rate controlled in the 50-60s. He was given IV lasix while in the ED and TRH called for admission. Cardiology is asked to see regarding his CHF.   Heart Pathway Score:  Past Medical History:  Diagnosis Date  . AAA (abdominal aortic aneurysm) ([place])    a. 08/2015 Abd U/S: 2.7 x 2.9 x 3.2 cm infrarenal AAA.  . Bradycardia    a. Requiring discontinuation of use of BB/CCB  . Cardiac arrest - ventricular fibrillation    a. 03/2012 in setting of hypokalemia (prolonged hosp with VDRF, tracheobronchitis, ARF, shock liver, PAF, AMS felt secondary to post-anoxic encephalopathy/shock).  . Carotid artery stenosis    a. 01/2011 - 40-59% bilateral stenosis;  b. 06/2015 Carotid U/S: 40-59% bilat ICA stenosis->f/u 6  mos.  . CKD (chronic kidney disease), stage III   . Coronary artery disease    a. s/p aborted ant STEMI tx with Cypher DES to LAD 10/04 (residual at cath: D1 50%, CFX 40% and multiple dist 70%, EF 55%);   b. myoview 3/10: Ef 47%, infero-apical isch, LOW RISK - med Tx recommended;  c. 12/2012 VF Arrest/Cath: LAD 40isr, 80 apical, LCX 100 (failed PTCA), RCA nondom.  . Diabetes mellitus   . Diverticulosis 2001  . Elevated LFTs    Shock liver 03/2012  . H/O: CVA (cardiovascular accident)   . Hematemesis    a. 12/2010 felt 2/2 Mallory Weiss tear - pt could not afford colonscopy/EGD at that time so Coumadin was deferred. Coumadin initiated 03/2012 without any evidence for bleeding.  Marland Kitchen History of Ischemic Cardiomyopathy    a. EF 50-55% by echo 03/09/12 (was 30% by echo 02/24/12); b. 03/2013 Echo: EF 55-60%, mild MR, sev dil LA, mod dil RA, PASP 55mmHg.  Marland Kitchen Hypertensive heart disease    a. echo 4/12: EF 50%, asymmetric septal hypertrophy, no SAM or LVOT gradient, LAE, PASP 35  . Inguinal hernia   . 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 Mitral regurgitation    a. Mild by echo 03/2012 and 03/2013.  . Mixed Hyperlipidemia   . Peripheral vascular disease ([place])    a. h/o LE angioplasty;  b. 08/2015 Duplex: >50% RCIA, 100% REIA, >50% LEIA, elev vel in SMA, patent IVC.  Marland Kitchen Persistent atrial fibrillation ([place])    a. Dx 12/2010-->coumadin (CHA2DS2VASc = 7).  . QT prolongation     Past Surgical History:  Procedure Laterality Date  . HERNIA REPAIR    . LEFT HEART CATH N/A 12/30/2012   Procedure: LEFT HEART CATH;  Surgeon: [name], MD;  Location: [facility] CATH LAB;  Service: Cardiovascular;  Laterality: N/A;  . ORIF TIBIA & FIBULA FRACTURES  01/11/07   OPEN TX OF UNICONDYLAR PLATEAU FRACTURE, IRRIGATION/DEBRIDEMENT OF OPEN FRACTURE INCLUDING BONE, REMOVAL OF EXTERNAL FIXATOR UNDER ANESTHESIA PER DR. MICHAEL HANDY  . PERIPHERAL VASCULAR CATHETERIZATION N/A 12/21/2015   Procedure:  Lower Extremity Angiography;  Surgeon: [name], MD;  Location: [place] CV LAB;  Service: Cardiovascular;  Laterality: N/A;  . POPLITEAL ARTERY ANGIOPLASTY  01/07/07   s/p LEFT POPLITEAL ARTERY EXPLORATION AND VEIN PATCH ANGIOPLASTY, PER DR. EARLY, SECONDARY TO ISCHEMIC LEFT FOOT RELATED TO LEFT POPLITEAL ARTERY INJURY  . RIGHT HEART CATH N/A 10/02/2019   Procedure: RIGHT HEART CATH;  Surgeon: [name], MD;  Location: [place] CV LAB;  Service: Cardiovascular;  Laterality: N/A;  . SKIN GRAFT    . TEE WITHOUT CARDIOVERSION N/A 08/08/2019   Procedure: TRANSESOPHAGEAL ECHOCARDIOGRAM (TEE);  Surgeon: Lelon Perla, MD;  Location: [facility] ENDOSCOPY;  Service: Cardiovascular;  Laterality: N/A;     Home Medications:  Prior to Admission medications   Medication Sig Start Date End Date Taking?  Authorizing Provider  acetaminophen (TYLENOL) 325 MG tablet Take 650 mg by mouth every 6 (six) hours as needed for headache.   Yes [provider]  albuterol (VENTOLIN HFA) 108 (90 Base) MCG/ACT inhaler Inhale 2 puffs into the lungs every 6 (six) hours as needed for wheezing or shortness of breath. 08/26/19  Yes Lelon Perla, MD  apixaban (ELIQUIS) 5 MG TABS tablet Take 1 tablet (5 mg total) by mouth 2 (two) times daily. 10/06/19 12/05/19 Yes Kayleen Memos, DO  aspirin EC 81 MG EC tablet Take 1 tablet (81 mg total) by mouth daily. 10/07/19 12/06/19 Yes Hall, Carole N, DO  atorvastatin (LIPITOR) 80 MG tablet Take 80 mg by mouth daily.   Yes [provider]  Ensure (ENSURE) Take 237 mLs by mouth 2 (two) times daily as needed (nutritional supplement).   Yes [provider]  Fluticasone-Salmeterol (ADVAIR DISKUS) 250-50 MCG/DOSE AEPB Inhale 1 puff into the lungs 2 (two) times daily for 14 days. Patient taking differently: Inhale 1 puff into the lungs 2 (two) times daily as needed (shortness of breath).  10/06/19 11/04/19 Yes Kayleen Memos, DO  hydrALAZINE (APRESOLINE) 10 MG  tablet Take 1 tablet (10 mg total) by mouth 2 (two) times daily. 09/23/19 12/22/19 Yes Verta Ellen., NP  isosorbide dinitrate (ISORDIL) 20 MG tablet Take 1 tablet (20 mg total) by mouth 3 (three) times daily. 10/06/19 12/05/19 Yes Kayleen Memos, DO  metoprolol succinate (TOPROL-XL) 50 MG 24 hr tablet Take 3 tablets (150 mg total) by mouth daily. Take with or immediately following a meal. 10/07/19 12/06/19 Yes Hall, Carole N, DO  nitroGLYCERIN (NITROSTAT) 0.4 MG SL tablet Place 1 tablet (0.4 mg total) under the tongue every 5 (five) minutes as needed for chest pain (Up to 3 doses). 10/22/19  Yes Theora Gianotti, NP  potassium chloride SA (KLOR-CON) 20 MEQ tablet Take 1 tablet (20 mEq total) by mouth 2 (two) times daily. 10/06/19 12/05/19 Yes Kayleen Memos, DO  torsemide (DEMADEX) 20 MG tablet Take 3 tablets (60 mg total) by mouth 2 (two) times daily. 10/06/19 12/05/19 Yes Kayleen Memos, DO    Inpatient Medications: Scheduled Meds:  Continuous Infusions:  PRN Meds:   Allergies:   No Known Allergies  Social History:   Social History   Socioeconomic History  . Marital status: Married    Spouse name: Not on file  . Number of children: 4  . Years of education: Not on file  . Highest education level: Not on file  Occupational History  . 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 Smoking status: Former Smoker    Packs/day: 0.50    Years: 30.00    Pack years: 15.00    Types: Cigarettes    Quit date: 03/12/2012    Years since quitting: 7.6  . Smokeless tobacco: Never Used  Substance and Sexual Activity  . Alcohol use: No    Alcohol/week: 0.0 standard drinks  . Drug use: No    Frequency: 2.0 times per week  . Sexual activity: Not on file  Other Topics Concern  . 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 YES. 1/2 -1 PPD OF CIGARETTES     NO ETOH   Social Determinants of Radio broadcast assistant  Strain:   . Difficulty of Paying Living Expenses: Not on file  Food Insecurity:   . Worried About Charity fundraiser in the Last Year: Not on file  . Ran Out of Food in the Last Year: Not on file  Transportation Needs:   . Lack of Transportation (Medical): Not on file  . Lack of Transportation (Non-Medical): Not on file  Physical Activity:   . Days of Exercise per Week: Not on file  . Minutes of Exercise per Session: Not on file  Stress:   . Feeling of Stress : Not on file  Social Connections:   . Frequency of Communication with Friends and Family: Not on file  . Frequency of Social Gatherings with Friends and Family: Not on file  . Attends Religious Services: Not on file  . Active Member of Clubs or Organizations: Not on file  . Attends Archivist Meetings: Not on file  . Marital Status: Not on file  Intimate Partner Violence:   . Fear of Current or Ex-Partner: Not on file  . Emotionally Abused: Not on file  . Physically Abused: Not on file  . Sexually Abused: Not on file    Family History:    Family History  Problem Relation Age of Onset  . Heart disease Father        ALSO UNCLE DIED HAD CAD  . Heart failure Father   . Brain cancer Mother   . Leukemia Mother   . Sickle cell anemia Mother   . Sickle cell anemia Other      ROS:  Please see the history of present illness.   All other ROS reviewed and negative.     Physical Exam/Data:   Vitals:   11/04/19 1430 11/04/19 1445 11/04/19 1500 11/04/19 1515  BP:  96/68    Pulse: 66  89 (!) 57  Resp: 18 (!) 23 (!) 21 13  Temp:      TempSrc:      SpO2: 100%  99% 100%  Weight:      Height:       No intake or output data in the 24 hours ending 11/04/19 1550 Last 3 Weights 11/04/2019 10/29/2019 10/09/2019  Weight (lbs) 170 lb 176 lb 159 lb 6.4 oz  Weight (kg) 77.111 kg 79.833 kg 72.303 kg  Some encounter information is confidential and restricted.  Go to Review Flowsheets activity to see all data.     Body mass index is 21.25 kg/m.  General: Thin, older AAM sitting up in bed. Wearing [place]. HEENT: normal Lymph: no adenopathy Neck: + JVD Endocrine:  No thryomegaly Vascular: No carotid bruits; FA pulses 2+ bilaterally without bruits  Cardiac:  normal S1, S2; Irreg Irreg; systolic murmur  Lungs:  Diffuse course rhonchi and expiratory wheezing. R>L Abd: soft, nontender, no hepatomegaly  Ext: mild LE edema mostly pretibial.  Musculoskeletal:  No deformities, BUE and BLE strength normal and equal Skin: warm and dry  Neuro:  CNs 2-12 intact, no focal abnormalities noted Psych:  Normal affect   EKG:  The EKG was personally reviewed and demonstrates:  N/a Telemetry:  Telemetry was personally reviewed and demonstrates:  Afib rate controlled in the 50-60s  Relevant CV Studies:  TTE: 09/29/19  IMPRESSIONS    1. Left ventricular ejection fraction, by visual estimation, is 25 to  30%. The left ventricle has severely decreased function. There is mildly  increased left ventricular hypertrophy.  2. Left ventricular diastolic function could not be evaluated.  3. Severely dilated left ventricular internal cavity size.  4. The left ventricle demonstrates global hypokinesis.  5. Global right ventricle has severely reduced systolic function.The  right ventricular size is mildly enlarged.  6. Left atrial size was severely dilated.  7. The interatrial septum is aneurysmal.  8. Right atrial size was severely dilated.  9. The mitral valve is normal in structure. Moderate mitral valve  regurgitation. No evidence of mitral stenosis.  10. The tricuspid valve is normal in structure. Tricuspid valve  regurgitation is mild.  11. The aortic valve is tricuspid. Aortic valve regurgitation is mild.  Mild to moderate aortic valve sclerosis/calcification without any evidence  of aortic stenosis.  12. The pulmonic valve was normal in structure.  Pulmonic valve  regurgitation is mild.  13. Moderately elevated pulmonary artery systolic pressure.  14. The inferior vena cava is dilated in size with <50% respiratory  variability, suggesting right atrial pressure of 15 mmHg.  15. Severe global reduction in LV systolic function; mild LVH; 4 chamber  enlargement; severe RV dysfunction; mild AI; moderate MR; mild TR;  moderate pulmonary hypertension.   RHC: 10/02/19   Moderate pulmonary hypertension with mean PA pressure of 42 mmHg.  Mean pulmonary capillary wedge pressure 28 mmHg.  Phasic pulmonary artery pressure 63/29 mmHg, mean RA pressure 20 mmHg, and pulmonary artery pulsatility index is 1.7 (suggesting RV dysfunction)  Mixed venous O2 saturation 57%  Laboratory Data:  High Sensitivity Troponin:  No results for input(s): TROPONINIHS in the last 720 hours.   Chemistry Recent Labs  Lab 10/29/19 1139 11/04/19 1241  NA 139 133*  K 4.2 5.2*  CL 101 99  CO2 22 25  GLUCOSE 106* 115*  BUN 37* 46*  CREATININE 2.09* 2.41*  CALCIUM 8.9 8.3*  GFRNONAA 30* 25*  GFRAA 35* 29*  ANIONGAP  --  9    Recent Labs  Lab 11/04/19 1241  PROT 5.6*  ALBUMIN 2.9*  AST 29  ALT 20  ALKPHOS 124  BILITOT 1.4*   HematologyNo results for input(s): WBC, RBC, HGB, HCT, MCV, MCH, MCHC, RDW, PLT in the last 168 hours. BNP Recent Labs  Lab 11/04/19 1241  BNP 1,655.3*    DDimer No results for input(s): DDIMER in the last 168 hours.   Radiology/Studies:  CT Head Wo Contrast  Result Date: 11/04/2019 CLINICAL DATA:  The patient's legs gave way today but the patient did not fall. Initial encounter. EXAM: CT HEAD WITHOUT CONTRAST TECHNIQUE: Contiguous axial images were obtained from the base of the skull through the vertex without intravenous contrast. COMPARISON:  Head CT scan 09/28/2019. FINDINGS: Brain: No evidence of acute infarction, hemorrhage, hydrocephalus, extra-axial collection or mass lesion/mass effect. Atrophy and chronic  microvascular ischemic change noted. Vascular: Atherosclerosis. Skull: Intact. No focal lesion. Sinuses/Orbits: Negative. Other: None. IMPRESSION: 1. No acute abnormality. 2. Atrophy and chronic microvascular ischemic change. 3. Atherosclerosis. Electronically Signed   By: Inge Rise M.D.   On: 11/04/2019 14:26   DG Chest Portable 1 View  Result Date: 11/04/2019 CLINICAL DATA:  Right lower extremity weakness and numbness last night for 30 minutes and again today at 10:30 this morning. EXAM: PORTABLE CHEST 1 VIEW COMPARISON:  Single-view of the chest 09/28/2019. PA and lateral chest 06/22/2019. FINDINGS: There is cardiomegaly and pulmonary edema. Small bilateral pleural effusions. No pneumothorax. Atherosclerosis. IMPRESSION: Congestive heart failure. Atherosclerosis. Electronically Signed   By: Inge Rise M.D.   On: 11/04/2019 12:53    Assessment and Plan:   Daronte Shostak  Bos is a 79 y.o. male with a hx of permanent Afib, CAD, Vfib arrest, ICM and CHF who is being seen today for the evaluation of CHF/afib at the request of Dr. Dorothyann Gibbs.  1. Acute on Chronic combined HF/ICM: known EF of 25-30% with global hypokinesis. Mildly enlarged RV. Reports compliance with home medications prior to admission. Sudden onset of dyspnea yesterday. Reports his weights have been stable. BNP 1655, and CXR with edema.  Has been given IV lasix 40mg  in the ED. Blood pressures are soft therefore will need to be cautious with diuresis. Suspect this dyspnea is multifactorial in nature. -- on Toprol, Isordil, and demadex 60mg  BID prior to admission. No ACE/ARB, spiro with renal dysfunction -- would continue with IV lasix, plan for 80mg  BID and monitor with soft blood pressures. May need to hold BPs meds to allow for additional diuresis.  2. COPD: has been following with pulmonary and placed on home O2 at 2L [place] continuously. Suspect this is contributing to his dyspnea as well.  -- continue home inhalers  3.  Permanent Afib: he is rate controlled in the ED. Complaint with Eliquis prior to admission.   4. Right leg weakness: CT head was negative. No focal deficits noted. Work up per primary.   5. Mitral Regurgation: moderate by TEE back in Dec 2020  6. Hyperkalemia: K+ 5.2 on admission. On K+ supplement at home. This has been held on admission.  7. CKD III: baseline appears now to be closer to 2.0. 2.4 on admission. Follow closely with the need for diuresis.   8. PAD: does have known hx of PAD with PV angiogram in 2018 showing occluded R external iliac artery and common femoral artery not amenable to PCI, along with 80% L common femoral artery stenosis and occluded left SFA not amenable to PCI. ABIs were ordered as an outpatient but he has not completed yet  For questions or updates, please contact [place] Please consult [email or web address] for contact info under   Signed, Reino Bellis, NP  11/04/2019 3:50 PM As above, patient seen and examined.  Briefly he is a 79 year old male with past medical history of coronary artery disease, ischemic cardiomyopathy, mitral regurgitation, prior ventricular fibrillation arrest, chronic systolic congestive heart failure, stage III chronic kidney disease, peripheral vascular disease for evaluation of acute on chronic systolic congestive heart failure. TEE December 2020 showed ejection fraction 25 to 30%, moderate RV dysfunction, moderately enlarged RV, biatrial enlargement, no LAA thrombus, moderate mitral regurgitation, mild tricuspid regurgitation and mild aortic insufficiency. Echocardiogram January 2021 showed ejection fraction 25 to 30%, mild left ventricular hypertrophy, severe left ventricular enlargement, severe RV dysfunction, biatrial enlargement, moderate mitral regurgitation and mild aortic insufficiency. Right heart cath January 2021 showed pulmonary capillary wedge pressure 28, PA pressure 63/29 and RA pressure of 20.  Patient has had recent admissions  for congestive heart failure and question syncope.  He declined ICD at the time but agreed to see Dr. Lovena Le following discharge.  Patient states he has been doing well at home.  Yesterday he developed sudden loss of strength and sensation as well as pain in his entire right lower extremity for 30 minutes.  Resolved spontaneously.  Developed some dyspnea last night.  He had a second episode of right lower extremity weakness, loss of sensation and pain in his right lower extremity today which is now resolved.  He is felt to have congestive heart failure and cardiology asked to evaluate.  Sodium 133, potassium 5.2, BUN 46, creatinine  2.41.  BNP 1655. ECG October 09, 2019 showed atrial fibrillation with PVCs or aberrantly conducted beats, septal and lateral infarct, inferolateral T wave inversion and left ventricular hypertrophy.  1 acute on chronic systolic congestive heart failure-patient is mildly volume overloaded on examination.  Chest x-ray shows edema and BNP elevated as well.  We will treat with Lasix 80 mg IV twice daily.  Follow renal function closely.  2 right lower extremity pain, transient numbness, transient weakness-etiology unclear.  Patient does have diminished femoral and distal pulses but loss of strength transiently for 30 minutes which seem to be unusual for diminished blood flow.  ABIs with Doppler pending.  Agree with neurology evaluation.  3 ischemic cardiomyopathy-continue beta-blocker, hydralazine and nitrates.  No ARB or Entresto given renal insufficiency.  4 mitral regurgitation-moderate on recent transesophageal echocardiogram.  5 chronic stage III kidney disease-follow renal function closely with diuresis.  6 permanent atrial fibrillation-continue beta-blocker for rate control.  Continue apixaban at present dose.  7 COPD-continue bronchodilators.  Kirk Ruths, MD

## 2019-11-04 NOTE — Telephone Encounter (Signed)
Left detailed message Layla Barter to contact medical doctor regarding med.

## 2019-11-05 ENCOUNTER — Inpatient Hospital Stay (HOSPITAL_COMMUNITY): Payer: Medicare HMO

## 2019-11-05 ENCOUNTER — Encounter (HOSPITAL_COMMUNITY): Payer: Medicare HMO

## 2019-11-05 DIAGNOSIS — I70213 Atherosclerosis of native arteries of extremities with intermittent claudication, bilateral legs: Secondary | ICD-10-CM

## 2019-11-05 DIAGNOSIS — I480 Paroxysmal atrial fibrillation: Secondary | ICD-10-CM

## 2019-11-05 DIAGNOSIS — Z7901 Long term (current) use of anticoagulants: Secondary | ICD-10-CM

## 2019-11-05 DIAGNOSIS — I739 Peripheral vascular disease, unspecified: Secondary | ICD-10-CM

## 2019-11-05 DIAGNOSIS — I5022 Chronic systolic (congestive) heart failure: Secondary | ICD-10-CM

## 2019-11-05 LAB — CBC
HCT: 20.6 % — ABNORMAL LOW (ref 39.0–52.0)
Hemoglobin: 6.9 g/dL — CL (ref 13.0–17.0)
MCH: 24.9 pg — ABNORMAL LOW (ref 26.0–34.0)
MCHC: 33.5 g/dL (ref 30.0–36.0)
MCV: 74.4 fL — ABNORMAL LOW (ref 80.0–100.0)
Platelets: 252 10*3/uL (ref 150–400)
RBC: 2.77 MIL/uL — ABNORMAL LOW (ref 4.22–5.81)
RDW: 17 % — ABNORMAL HIGH (ref 11.5–15.5)
WBC: 5.6 10*3/uL (ref 4.0–10.5)
nRBC: 1.8 % — ABNORMAL HIGH (ref 0.0–0.2)

## 2019-11-05 LAB — BASIC METABOLIC PANEL
Anion gap: 9 (ref 5–15)
BUN: 54 mg/dL — ABNORMAL HIGH (ref 8–23)
CO2: 23 mmol/L (ref 22–32)
Calcium: 8.2 mg/dL — ABNORMAL LOW (ref 8.9–10.3)
Chloride: 101 mmol/L (ref 98–111)
Creatinine, Ser: 2.66 mg/dL — ABNORMAL HIGH (ref 0.61–1.24)
GFR calc Af Amer: 26 mL/min — ABNORMAL LOW (ref >60)
GFR calc non Af Amer: 22 mL/min — ABNORMAL LOW (ref >60)
Glucose, Bld: 117 mg/dL — ABNORMAL HIGH (ref 70–99)
Potassium: 4.7 mmol/L (ref 3.5–5.1)
Sodium: 133 mmol/L — ABNORMAL LOW (ref 135–145)

## 2019-11-05 LAB — PREPARE RBC (CROSSMATCH)

## 2019-11-05 LAB — MAGNESIUM: Magnesium: 2.9 mg/dL — ABNORMAL HIGH (ref 1.7–2.4)

## 2019-11-05 MED ORDER — POLYETHYLENE GLYCOL 3350 17 G PO PACK
17.0000 g | PACK | Freq: Every day | ORAL | Status: DC
  Administered 2019-11-05 – 2019-11-08 (×4): 17 g via ORAL
  Filled 2019-11-05 (×4): qty 1

## 2019-11-05 MED ORDER — SODIUM CHLORIDE 0.9% IV SOLUTION: Freq: Once | INTRAVENOUS | Status: AC

## 2019-11-05 MED ORDER — ALBUTEROL SULFATE (2.5 MG/3ML) 0.083% IN NEBU: 2.5000 mg | INHALATION_SOLUTION | Freq: Four times a day (QID) | RESPIRATORY_TRACT | Status: DC | PRN

## 2019-11-05 MED ORDER — OXYCODONE HCL 5 MG PO TABS
5.0000 mg | ORAL_TABLET | ORAL | Status: DC | PRN
  Administered 2019-11-05: 5 mg via ORAL
  Filled 2019-11-05: qty 1

## 2019-11-05 MED ORDER — METOPROLOL TARTRATE 50 MG PO TABS
50.0000 mg | ORAL_TABLET | Freq: Two times a day (BID) | ORAL | Status: DC
  Administered 2019-11-06 – 2019-11-07 (×4): 50 mg via ORAL
  Filled 2019-11-05 (×3): qty 1
  Filled 2019-11-05: qty 2

## 2019-11-05 MED ORDER — GUAIFENESIN-DM 100-10 MG/5ML PO SYRP: 5.0000 mL | ORAL_SOLUTION | ORAL | Status: DC | PRN

## 2019-11-05 MED ORDER — PANTOPRAZOLE SODIUM 40 MG IV SOLR
40.0000 mg | Freq: Two times a day (BID) | INTRAVENOUS | Status: DC
  Administered 2019-11-05 – 2019-11-06 (×3): 40 mg via INTRAVENOUS
  Filled 2019-11-05 (×3): qty 40
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
## 2019-11-05 NOTE — Progress Notes (Signed)
PROGRESS NOTE    OLUWASEYI RAFFEL  ZOX:[id number] DOB: 07/05/1941 DOA: 11/04/2019 PCP: Patient, No Pcp Per      Brief Narrative:  Mr. Hunley is a 79 y.o. M with sCHF EF 25-30%, CAD with isch CM, hx VF arrest no ICD, chronic hypoxic respiratory failure on 2 L, HTN, hx CVA, AAA, CKD stage IV baseline Cr 1.8-2.5, hx cardiac arrest, PVD carotid, persistent AF on Eliquis, and PVD carotids and bilateral LE who presented with pain in the right leg.        Assessment & Plan:  Right leg weakness and pain Patient presents with paresthesias, claudication, and reduced pulses.  Given his known peripheral vascular disease, I am worried his pain and weakness are from progressive arterial insufficiency.  At his last LE angiography in 2017 he had "occluded R external iliac and occluded L SFA" both not percutaneously addressable.  -Consult vascular surgery -Okay to do ABIs and vascular duplex -Transfuse to keep Hgb >8 d/gL given concern for limb ischemia   Discussed with neurology, if MRI brain negative after 24 hours of symptoms, they consider this unlikely neurological in nature. -Obtain MRI brain    Acute on chronic CHF Pulmonary HTN, group 2 RHF Echo 1 month ago showed EF 25 to 30%. Severe RHF, pHTN.  RHC showed PCWP 28 -Continue furosemide 80 mg IV twice a day  -Monitor K -Strict I/Os, daily weights, telemetry  -Daily monitoring renal function -Continue atorvastatin -Continue Imdur, hydralazine -Continue metoprolol   Severe anemia Suspected GI blood loss anemia Patient reports melena, NSAID use.  Melena on exam. -Stop Eliquis, aspirin -Start PPI IV BID -Consult GI -Clears only  Coronary disease, cerebrovascular disease and peripheral vascular disease, secondary prevention Ischemic cardiomyopathy History of VF arrest Hypertension Evidently has been evaluated for ICD, and either patient deferred, or was deferred for a time and patient was lost to follow-up.  BP controlled,  improved overnight. -Continue atorvastatin -Continue metoprolol, Imdur, hydralazine, metoprolol -Hold aspirin, ELiquis for now  Chronic hypoxic respiratory failure COPD -Continue home O2 -Continue ICS-LABA  CKD stage IV Cr slightly up relative to baseline.  Baseline GFR in last few months is 25-35. -Transfuse to keep Hgb >8 g/dL -Diuresis and daily Cr monitoring -Avoid hypotension  Persistent atrial fibrillation Currently sinus -Continue metoprolol -Hold ELiquis given suspected GIB  Moderate protein calorie malnutrition As evidenced by weight loss, diminished muscle mass and fat, BMI 22, and chronic organ failure. -Continue Ensure  Hyponatremia Mild -Repeat BMP        Disposition: The patient was admitted with right leg pain.  Differential still includes vascular and neurological etiology.  Also noted to have severe anemia.  He will need ongoing IV Lasix, blood transfusions, close monitoring of renal function with diuresis, and evaluation of surgical revascularization of the leg.   I will discharge when his volume status is normal, his leg symptoms are resolved or etiology determined, and anemia is corrected.        MDM: The below labs and imaging reports were reviewed and summarized above.  Medication management as above.Acute illness that still poses threat to limb.   DVT prophylaxis: SCDs Code Status: FULL Family Communication:     Consultants:   Cardiology  Vascular surgery  Vascular Cardiology  Gastroenterology  Procedures:   3/1 CT head -- NAICP  3/1 x-ray lumbar spine -- nonspecific degenerative changes  3/2 ABI and duplex US bilateral LE -- pending  3/2 MRI brain pending  Antimicrobials:  Culture data:              Subjective: Went for a walk with PT before lunch and developed severe pain and numbness in that leg, lost pulses that were previously present.  Objective: Vitals:   11/05/19 1040 11/05/19 1300  11/05/19 1349 11/05/19 1415  BP: 112/84 114/89 108/62 90/65  Pulse:  83 75 69  Resp:   16 18  Temp:   97.9 F (36.6 C) 98 F (36.7 C)  TempSrc:   Oral Oral  SpO2:   100% 98%  Weight:      Height:        Intake/Output Summary (Last 24 hours) at 11/05/2019 1416 Last data filed at 11/05/2019 1304 Gross per 24 hour  Intake 825 ml  Output 1200 ml  Net -375 ml   Filed Weights   11/04/19 1630 11/04/19 1740 11/05/19 0014  Weight: 81.7 kg 81.7 kg 83.1 kg    Examination: General appearance: elderly adult male, alert and in moderate distress from leg pain.   HEENT: Anicteric, conjunctiva pink, lids and lashes normal. No nasal deformity, discharge, epistaxis.  Lips moist, OP moist, no oral lesions, hearing normal.   Skin: Warm and dry.  No jaundice.  No suspicious rashes or lesions. Cardiac: RRR, occasional premature beats, nl S1-S2, no murmurs appreciated.  Capillary refill is brisk.  JVP not visible.  Trace LE edema.  Radial pulses 2+ and symmetric.  Right leg popliteal pulse biphasic, no dopplerable DP or PT pulses. Respiratory: Normal respiratory rate and rhythm.  CTAB without rales or wheezes. Abdomen: Abdomen soft.  No TTP or guarding. No ascites, distension, hepatosplenomegaly.   MSK: No deformities or effusions. Neuro: Awake and alert.  Right leg dorsi- and plantarflexion diminished.    EOMI, moves left leg but not right leg well.  Bilateral UE strong.  Speech fluent.    Psych: Sensorium intact and responding to questions, attention normal. Affect normal.  Judgment and insight appear normal.    Data Reviewed: I have personally reviewed following labs and imaging studies:  CBC: Recent Labs  Lab 11/04/19 1944 11/05/19 0910  WBC 5.3 5.6  NEUTROABS 3.1  --   HGB 7.0* 6.9*  HCT 21.0* 20.6*  MCV 75.3* 74.4*  PLT 257 979   Basic Metabolic Panel: Recent Labs  Lab 11/04/19 1241 11/05/19 0530 11/05/19 1225  NA 133* 133*  --   K 5.2* 4.7  --   CL 99 101  --   CO2 25 23  --     GLUCOSE 115* 117*  --   BUN 46* 54*  --   CREATININE 2.41* 2.66*  --   CALCIUM 8.3* 8.2*  --   MG  --   --  2.9*   GFR: Estimated Creatinine Clearance: 27.8 mL/min (A) (by C-G formula based on SCr of 2.66 mg/dL (H)). Liver Function Tests: Recent Labs  Lab 11/04/19 1241  AST 29  ALT 20  ALKPHOS 124  BILITOT 1.4*  PROT 5.6*  ALBUMIN 2.9*   No results for input(s): LIPASE, AMYLASE in the last 168 hours. No results for input(s): AMMONIA in the last 168 hours. Coagulation Profile: No results for input(s): INR, PROTIME in the last 168 hours. Cardiac Enzymes: No results for input(s): CKTOTAL, CKMB, CKMBINDEX, TROPONINI in the last 168 hours. BNP (last 3 results) Recent Labs    08/15/19 0949  PROBNP 16,511*   HbA1C: No results for input(s): HGBA1C in the last 72 hours. CBG: No results for input(s):  GLUCAP in the last 168 hours. Lipid Profile: No results for input(s): CHOL, HDL, LDLCALC, TRIG, CHOLHDL, LDLDIRECT in the last 72 hours. Thyroid Function Tests: No results for input(s): TSH, T4TOTAL, FREET4, T3FREE, THYROIDAB in the last 72 hours. Anemia Panel: No results for input(s): VITAMINB12, FOLATE, FERRITIN, TIBC, IRON, RETICCTPCT in the last 72 hours. Urine analysis:    Component Value Date/Time   COLORURINE YELLOW 11/04/2019 1500   APPEARANCEUR CLEAR 11/04/2019 1500   LABSPEC 1.011 11/04/2019 1500   PHURINE 6.0 11/04/2019 1500   GLUCOSEU NEGATIVE 11/04/2019 1500   HGBUR NEGATIVE 11/04/2019 1500   BILIRUBINUR NEGATIVE 11/04/2019 1500   KETONESUR NEGATIVE 11/04/2019 1500   PROTEINUR NEGATIVE 11/04/2019 1500   UROBILINOGEN 0.2 03/23/2013 1740   NITRITE NEGATIVE 11/04/2019 1500   LEUKOCYTESUR NEGATIVE 11/04/2019 1500   Sepsis Labs: @LABRCNTIP (procalcitonin:4,lacticacidven:4)  ) Recent Results (from the past 240 hour(s))  SARS CORONAVIRUS 2 (TAT 6-24 HRS) Nasopharyngeal Nasopharyngeal Swab     Status: None   Collection Time: 11/04/19  5:05 PM   Specimen:  Nasopharyngeal Swab  Result Value Ref Range Status   SARS Coronavirus 2 NEGATIVE NEGATIVE Final    Comment: (NOTE) SARS-CoV-2 target nucleic acids are NOT DETECTED. The SARS-CoV-2 RNA is generally detectable in upper and lower respiratory specimens during the acute phase of infection. Negative results do not preclude SARS-CoV-2 infection, do not rule out co-infections with other pathogens, and should not be used as the sole basis for treatment or other patient management decisions. Negative results must be combined with clinical observations, patient history, and epidemiological information. The expected result is Negative. Fact Sheet for Patients: SugarRoll.be Fact Sheet for Healthcare Providers: https://www.woods-mathews.com/ This test is not yet approved or cleared by the [place] FDA and  has been authorized for detection and/or diagnosis of SARS-CoV-2 by FDA under an Emergency Use Authorization (EUA). This EUA will remain  in effect (meaning this test can be used) for the duration of the COVID-19 declaration under Section 56 4(b)(1) of the Act, 21 U.S.C. section 360bbb-3(b)(1), unless the authorization is terminated or revoked sooner. Performed at [facility], [place] [address], [place], [place] [address]          Radiology Studies: DG Lumbar Spine 2-3 Views  Result Date: 11/04/2019 CLINICAL DATA:  Right lower extremity weakness EXAM: LUMBAR SPINE - 2-3 VIEW COMPARISON:  None. FINDINGS: Sagittal alignment is within normal limits. Vertebral body heights are grossly maintained. Moderate severe degenerative change at L5-S1. Mild osteophytes at most levels throughout the lumbar spine. Dense aortic atherosclerosis. Ectatic distal abdominal aorta measuring up to 3.2 cm. IMPRESSION: 1. No acute osseous abnormality. 2. Degenerative changes most advanced at L5-S1 3. Dense aortic atherosclerosis. AP diameter of calcified distal abdominal  aorta up to 3.2 cm. This may be more thoroughly evaluated with CT, which could be performed on a nonemergent basis unless otherwise clinically indicated. Electronically Signed   By: Donavan Foil M.D.   On: 11/04/2019 17:03   CT Head Wo Contrast  Result Date: 11/04/2019 CLINICAL DATA:  The patient's legs gave way today but the patient did not fall. Initial encounter. EXAM: CT HEAD WITHOUT CONTRAST TECHNIQUE: Contiguous axial images were obtained from the base of the skull through the vertex without intravenous contrast. COMPARISON:  Head CT scan 09/28/2019. FINDINGS: Brain: No evidence of acute infarction, hemorrhage, hydrocephalus, extra-axial collection or mass lesion/mass effect. Atrophy and chronic microvascular ischemic change noted. Vascular: Atherosclerosis. Skull: Intact. No focal lesion. Sinuses/Orbits: Negative. Other: None. IMPRESSION: 1. No  acute abnormality. 2. Atrophy and chronic microvascular ischemic change. 3. Atherosclerosis. Electronically Signed   By: Inge Rise M.D.   On: 11/04/2019 14:26   DG Chest Portable 1 View  Result Date: 11/04/2019 CLINICAL DATA:  Right lower extremity weakness and numbness last night for 30 minutes and again today at 10:30 this morning. EXAM: PORTABLE CHEST 1 VIEW COMPARISON:  Single-view of the chest 09/28/2019. PA and lateral chest 06/22/2019. FINDINGS: There is cardiomegaly and pulmonary edema. Small bilateral pleural effusions. No pneumothorax. Atherosclerosis. IMPRESSION: Congestive heart failure. Atherosclerosis. Electronically Signed   By: Inge Rise M.D.   On: 11/04/2019 12:53   VAS [place] ABI WITH/WO TBI  Result Date: 11/05/2019 LOWER EXTREMITY DOPPLER STUDY Indications: Claudication, and peripheral artery disease.  Limitations: Today's exam was limited due to involuntary patient movement. Comparison Study: AAA ultrasound 03-15-19. Performing Technologist: Baldwin Crown RDMS  Examination Guidelines: A complete evaluation includes at minimum,  Doppler waveform signals and systolic blood pressure reading at the level of bilateral brachial, anterior tibial, and posterior tibial arteries, when vessel segments are accessible. Bilateral testing is considered an integral part of a complete examination. Photoelectric Plethysmograph (PPG) waveforms and toe systolic pressure readings are included as required and additional duplex testing as needed. Limited examinations for reoccurring indications may be performed as noted.  ABI Findings: +---------+------------------+-----+---------+--------+ Right    Rt Pressure (mmHg)IndexWaveform Comment  +---------+------------------+-----+---------+--------+ Brachial 120                    triphasic         +---------+------------------+-----+---------+--------+ PTA                             absent            +---------+------------------+-----+---------+--------+ DP                              absent            +---------+------------------+-----+---------+--------+ Great Toe                       Absent            +---------+------------------+-----+---------+--------+ +---------+------------------+-----+---------+---------------+ Left     Lt Pressure (mmHg)IndexWaveform Comment         +---------+------------------+-----+---------+---------------+ Brachial 122                    triphasic                +---------+------------------+-----+---------+---------------+ PTA      43                0.35          possibly venous +---------+------------------+-----+---------+---------------+ DP                                       possibly venous +---------+------------------+-----+---------+---------------+ Great Toe                       Absent                   +---------+------------------+-----+---------+---------------+  Summary: Right: Unable to innsonate PTA or DP due to low velocity vs nonexistant waveforms. Left: Resting left ankle-brachial index indicates  severe left lower extremity arterial disease. Due to  irregularity of waveforms unable to distingusihed between arterial vs venous flow.  *See table(s) above for measurements and observations.    Preliminary         Scheduled Meds: . atorvastatin  80 mg Oral Daily  . feeding supplement (ENSURE ENLIVE)  237 mL Oral BID BM  . furosemide  80 mg Intravenous BID  . hydrALAZINE  10 mg Oral BID  . isosorbide dinitrate  20 mg Oral TID  . metoprolol succinate  150 mg Oral Daily  . mometasone-formoterol  2 puff Inhalation BID  . pantoprazole (PROTONIX) IV  40 mg Intravenous Q12H  . polyethylene glycol  17 g Oral Daily  . sodium chloride flush  3 mL Intravenous Q12H   Continuous Infusions: . sodium chloride       LOS: 1 day    Time spent: 35 minutes    Edwin Dada, MD [facility] 11/05/2019, 2:16 PM     Please page though [place] or Epic secure chat:  For [employer], Adult nurse

## 2019-11-05 NOTE — Progress Notes (Signed)
Progress Note  Patient Name: Logan White Date of Encounter: 11/05/2019  Primary Cardiologist: Kirk Ruths, MD   Subjective   Denies CP or dyspnea; Patient had another episode of right lower extremity weakness, numbness and pain transiently.    Inpatient Medications    Scheduled Meds:  apixaban  5 mg Oral BID   aspirin EC  81 mg Oral Daily   atorvastatin  80 mg Oral Daily   feeding supplement (ENSURE ENLIVE)  237 mL Oral BID BM   furosemide  80 mg Intravenous BID   hydrALAZINE  10 mg Oral BID   isosorbide dinitrate  20 mg Oral TID   metoprolol succinate  150 mg Oral Daily   mometasone-formoterol  2 puff Inhalation BID   sodium chloride flush  3 mL Intravenous Q12H   Continuous Infusions:  sodium chloride     PRN Meds: sodium chloride, acetaminophen, albuterol, ondansetron (ZOFRAN) IV, sodium chloride flush   Vital Signs    Vitals:   11/05/19 0503 11/05/19 0720 11/05/19 0744 11/05/19 0800  BP: 92/67   113/66  Pulse: 71 74  (!) 56  Resp: 20 18  13   Temp: 98.6 F (37 C) 98 F (36.7 C)    TempSrc:  Oral    SpO2: 96% 99% 98% 100%  Weight:      Height:        Intake/Output Summary (Last 24 hours) at 11/05/2019 0913 Last data filed at 11/05/2019 0900 Gross per 24 hour  Intake 825 ml  Output 750 ml  Net 75 ml   Last 3 Weights 11/05/2019 11/04/2019 11/04/2019  Weight (lbs) 183 lb 3.2 oz 180 lb 1.9 oz 180 lb 1.9 oz  Weight (kg) 83.099 kg 81.7 kg 81.7 kg  Some encounter information is confidential and restricted. Go to Review Flowsheets activity to see all data.      Telemetry    Atrial fibrillation with PVCs or aberrantly conducted beats; rate controlled - Personally Reviewed  Physical Exam   GEN: No acute distress.   Neck: No JVD Cardiac: irregular Respiratory: Clear to auscultation bilaterally. GI: Soft, nontender, non-distended  MS: 1+ edema Neuro:  Nonfocal  Psych: Normal affect   Labs   Chemistry Recent Labs  Lab 10/29/19 1139  11/04/19 1241 11/05/19 0530  NA 139 133* 133*  K 4.2 5.2* 4.7  CL 101 99 101  CO2 22 25 23   GLUCOSE 106* 115* 117*  BUN 37* 46* 54*  CREATININE 2.09* 2.41* 2.66*  CALCIUM 8.9 8.3* 8.2*  PROT  --  5.6*  --   ALBUMIN  --  2.9*  --   AST  --  29  --   ALT  --  20  --   ALKPHOS  --  124  --   BILITOT  --  1.4*  --   GFRNONAA 30* 25* 22*  GFRAA 35* 29* 26*  ANIONGAP  --  9 9     Hematology Recent Labs  Lab 11/04/19 1944  WBC 5.3  RBC 2.79*  HGB 7.0*  HCT 21.0*  MCV 75.3*  MCH 25.1*  MCHC 33.3  RDW 17.2*  PLT 257    BNP Recent Labs  Lab 11/04/19 1241  BNP 1,655.3*     Radiology    DG Lumbar Spine 2-3 Views  Result Date: 11/04/2019 CLINICAL DATA:  Right lower extremity weakness EXAM: LUMBAR SPINE - 2-3 VIEW COMPARISON:  None. FINDINGS: Sagittal alignment is within normal limits. Vertebral body heights are grossly maintained.  Moderate severe degenerative change at L5-S1. Mild osteophytes at most levels throughout the lumbar spine. Dense aortic atherosclerosis. Ectatic distal abdominal aorta measuring up to 3.2 cm. IMPRESSION: 1. No acute osseous abnormality. 2. Degenerative changes most advanced at L5-S1 3. Dense aortic atherosclerosis. AP diameter of calcified distal abdominal aorta up to 3.2 cm. This may be more thoroughly evaluated with CT, which could be performed on a nonemergent basis unless otherwise clinically indicated. Electronically Signed   By: Donavan Foil M.D.   On: 11/04/2019 17:03   CT Head Wo Contrast  Result Date: 11/04/2019 CLINICAL DATA:  The patient's legs gave way today but the patient did not fall. Initial encounter. EXAM: CT HEAD WITHOUT CONTRAST TECHNIQUE: Contiguous axial images were obtained from the base of the skull through the vertex without intravenous contrast. COMPARISON:  Head CT scan 09/28/2019. FINDINGS: Brain: No evidence of acute infarction, hemorrhage, hydrocephalus, extra-axial collection or mass lesion/mass effect. Atrophy and  chronic microvascular ischemic change noted. Vascular: Atherosclerosis. Skull: Intact. No focal lesion. Sinuses/Orbits: Negative. Other: None. IMPRESSION: 1. No acute abnormality. 2. Atrophy and chronic microvascular ischemic change. 3. Atherosclerosis. Electronically Signed   By: Inge Rise M.D.   On: 11/04/2019 14:26   DG Chest Portable 1 View  Result Date: 11/04/2019 CLINICAL DATA:  Right lower extremity weakness and numbness last night for 30 minutes and again today at 10:30 this morning. EXAM: PORTABLE CHEST 1 VIEW COMPARISON:  Single-view of the chest 09/28/2019. PA and lateral chest 06/22/2019. FINDINGS: There is cardiomegaly and pulmonary edema. Small bilateral pleural effusions. No pneumothorax. Atherosclerosis. IMPRESSION: Congestive heart failure. Atherosclerosis. Electronically Signed   By: Inge Rise M.D.   On: 11/04/2019 12:53    Patient Profile     79 y.o. male with past medical history of coronary disease, ischemic cardiomyopathy, mitral regurgitation, previous ventricular fibrillation arrest, chronic systolic congestive heart failure, chronic stage III kidney disease, peripheral vascular disease admitted with acute on chronic systolic congestive heart failure and intermittent right lower extremity numbness, weakness and pain. Echocardiogram January 2021 showed ejection fraction 25 to 30%, mild left ventricular hypertrophy, severe left ventricular enlargement, severe RV dysfunction, biatrial enlargement, moderate mitral regurgitation and mild aortic insufficiency. Right heart cath January 2021 showed pulmonary capillary wedge pressure 28, PA pressure 63/29 and RA pressure of 20.   Assessment & Plan    1 acute on chronic systolic congestive heart failure-mildly volume overloaded today.  Continue Lasix at present dose and transition to oral Lasix tomorrow.  Follow renal function.   2 right lower extremity pain, transient numbness, transient weakness-etiology remains unclear.   Would ask neurology to evaluate.  Presentation would be unusual for vascular insufficiency as it would be unusual to see sudden loss of use of his extremity.  I agree with ABIs.  3 ischemic cardiomyopathy-continue beta-blocker, hydralazine and nitrates.  No ARB or Entresto given renal insufficiency.  4 mitral regurgitation-moderate on recent transesophageal echocardiogram.  5 chronic stage III kidney disease-follow renal function closely with diuresis.  6 permanent atrial fibrillation-continue beta-blocker for rate control.  Continue apixaban at present dose.  7 anemia-hemoglobin markedly decreased compared to previous.  Will repeat CBC to rule out lab error.  Patient denies melena, hematochezia or abdominal pain.  If hemoglobin truly decreased may need to hold apixaban transiently.  For questions or updates, please contact [place] Please consult [email or web address] for contact info under        Signed, Kirk Ruths, MD  11/05/2019, 9:13 AM
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
## 2019-11-05 NOTE — Evaluation (Signed)
Physical Therapy Evaluation Patient Details Name: Logan White MRN: [id number] DOB: 02/27/1941 Today's Date: 11/05/2019   History of Present Illness   Logan White is a 79 y.o. male with medical history significant of chronic systolic CHF, hypertension, CVA, AAA, CKD stage III, chronic hypoxic respiratory failure on 2 L of oxygen on all time, presented to the ED via EMS with a chief complaint of right lower extremity weakness and pain. Patient has been having problem with the right leg for some time, he said sometimes he feels intermittent "pins-and-needles feeling" from side of right thigh to big toe, usually happens prolonged standing, and if he walks, within 3 to 5 minutes he will feel very heavy on the right leg,  Clinical Impression  Pt admitted with above. Pt began session with supervision however s/p 100' of ambulation pt with R LE onset of pins and needles, heaviness, and pain requiring freq stops and holding onto hallway rail. Pt unable to lift R LE back up in to bed or move into bed. Pt states it will last about 10-15 min. RN made aware. Unsure if it's vascular related or spinal related. Acute PT to follow.    Follow Up Recommendations Home health PT;Supervision/Assistance - 24 hour    Equipment Recommendations  Rolling walker with 5" wheels(potentially)    Recommendations for Other Services       Precautions / Restrictions Precautions Precautions: Fall Precaution Comments:  R LE numbness/heaviness with ambulation Restrictions Weight Bearing Restrictions: No      Mobility  Bed Mobility Overal bed mobility: Needs Assistance Bed Mobility: Supine to Sit;Sit to Supine     Supine to sit: Supervision Sit to supine: Mod assist   General bed mobility comments: pt able to bring self to EOB  however pt unable to lift R LE back into bed due to numbness and heaviness  Transfers Overall transfer level: Needs assistance Equipment used: None Transfers: Sit to/from  Stand Sit to Stand: Min guard         General transfer comment: min guard for safety  Ambulation/Gait Ambulation/Gait assistance: Min guard Gait Distance (Feet): 150 Feet Assistive device: None Gait Pattern/deviations: Step-through pattern Gait velocity: slow   General Gait Details: pt with frequent stops due to onset of R LE heaviness and pins and needles requiring stopping and holding onto hallway rail.   Stairs            Wheelchair Mobility    Modified Rankin (Stroke Patients Only)       Balance Overall balance assessment: Mild deficits observed, not formally tested                                           Pertinent Vitals/Pain Pain Assessment: Faces Faces Pain Scale: Hurts even more Pain Location: R LE s/p 100' of amb Pain Descriptors / Indicators: Heaviness;Pins and needles;Numbness;Tingling Pain Intervention(s): Monitored during session(reported to RN)    Home Living Family/patient expects to be discharged to:: Private residence Living Arrangements: Spouse/significant other(and son) Available Help at Discharge: Family;Available 24 hours/day(wife 24/7, son works during the day) Type of Home: Apartment Home Access: Level entry     [place]: One level [place]: [place] - single point      Prior Function Level of Independence: Independent with assistive device(s)         Comments: uses cane, reports he doesn't drive and  uses power buggy at grocery store     [place]: Right    Extremity/Trunk Assessment   Upper Extremity Assessment Upper Extremity Assessment: Overall WFL for tasks assessed    Lower Extremity Assessment Lower Extremity Assessment: Generalized weakness;RLE deficits/detail RLE Deficits / Details: onset of pins and needles, heaviness in R LE from hip to toes s/p 100' of amb RLE Sensation: decreased light touch    Cervical / Trunk Assessment Cervical / Trunk Assessment: Normal   Communication   Communication: No difficulties  Cognition Arousal/Alertness: Awake/alert Behavior During Therapy: WFL for tasks assessed/performed Overall Cognitive Status: Within Functional Limits for tasks assessed                                        General Comments General comments (skin integrity, edema, etc.): mild LE edema    Exercises     Assessment/Plan    PT Assessment Patient needs continued PT services  PT Problem List Decreased strength;Decreased range of motion;Decreased activity tolerance;Decreased balance;Decreased mobility;Pain       PT Treatment Interventions DME instruction;Gait training;Stair training;Functional mobility training;Therapeutic activities;Therapeutic exercise;Balance training;Neuromuscular re-education    PT Goals (Current goals can be found in the Care Plan section)  Acute Rehab PT Goals Patient Stated Goal: improve R LE PT Goal Formulation: With patient Time For Goal Achievement: 11/19/19 Potential to Achieve Goals: Good    Frequency Min 3X/week   Barriers to discharge        Co-evaluation               AM-PAC PT "6 Clicks" Mobility  Outcome Measure Help needed turning from your back to your side while in a flat bed without using bedrails?: None Help needed moving from lying on your back to sitting on the side of a flat bed without using bedrails?: None Help needed moving to and from a bed to a chair (including a wheelchair)?: A Little Help needed standing up from a chair using your arms (e.g., wheelchair or bedside chair)?: A Little Help needed to walk in hospital room?: A Little Help needed climbing 3-5 steps with a railing? : A Little 6 Click Score: 20    End of Session Equipment Utilized During Treatment: Gait belt Activity Tolerance: Patient limited by pain(RLE) Patient left: in bed;with call bell/phone within reach;with bed alarm set Nurse Communication: Mobility status(R LE pins and needles and  numbness) PT Visit Diagnosis: Unsteadiness on feet (R26.81);Difficulty in walking, not elsewhere classified (R26.2)    Time: 1000-1022 PT Time Calculation (min) (ACUTE ONLY): 22 min   Charges:   PT Evaluation $PT Eval Moderate Complexity: 1 Mod          Logan White, PT, DPT Acute Rehabilitation Services Pager #: [phone] Office #: [phone]   Logan White 11/05/2019, 12:04 PM

## 2019-11-05 NOTE — Progress Notes (Signed)
During bath and mouth care, patient sitting alongside bed. No complaints voiced. Able to move both extremittes equally without complaints of numbness or pain. All of a sudden, patient called writer to room reporting acute numbness in right foot. Right foot noted to be cool, able to move extremity, wiggle toes and feel touch. Warm blanket provided. Continue ongoing monitoring and plan of care.

## 2019-11-05 NOTE — Progress Notes (Signed)
ABI exam completed.  Results given to Dr. Loleta Books and Laurence Slate, PA.  Preliminary results can be found under CV proc under chart review.  11/05/2019 1:35 PM  Zade Falkner, K., RDMS, RVT

## 2019-11-06 ENCOUNTER — Encounter (HOSPITAL_COMMUNITY): Payer: Medicare HMO

## 2019-11-06 DIAGNOSIS — R531 Weakness: Secondary | ICD-10-CM

## 2019-11-06 DIAGNOSIS — K921 Melena: Secondary | ICD-10-CM

## 2019-11-06 DIAGNOSIS — D62 Acute posthemorrhagic anemia: Secondary | ICD-10-CM

## 2019-11-06 DIAGNOSIS — R0602 Shortness of breath: Secondary | ICD-10-CM

## 2019-11-06 DIAGNOSIS — I5023 Acute on chronic systolic (congestive) heart failure: Secondary | ICD-10-CM

## 2019-11-06 LAB — BASIC METABOLIC PANEL
Anion gap: 11 (ref 5–15)
BUN: 47 mg/dL — ABNORMAL HIGH (ref 8–23)
CO2: 25 mmol/L (ref 22–32)
Calcium: 8.4 mg/dL — ABNORMAL LOW (ref 8.9–10.3)
Chloride: 99 mmol/L (ref 98–111)
Creatinine, Ser: 2.3 mg/dL — ABNORMAL HIGH (ref 0.61–1.24)
GFR calc Af Amer: 31 mL/min — ABNORMAL LOW (ref >60)
GFR calc non Af Amer: 27 mL/min — ABNORMAL LOW (ref >60)
Glucose, Bld: 97 mg/dL (ref 70–99)
Potassium: 4.3 mmol/L (ref 3.5–5.1)
Sodium: 135 mmol/L (ref 135–145)

## 2019-11-06 LAB — BPAM RBC
Blood Product Expiration Date: 202103262359
Blood Product Expiration Date: 202103262359
ISSUE DATE / TIME: 202103021356
ISSUE DATE / TIME: 202103021843
Unit Type and Rh: 5100
Unit Type and Rh: 5100

## 2019-11-06 LAB — TYPE AND SCREEN
ABO/RH(D): O POS
Antibody Screen: NEGATIVE
Unit division: 0
Unit division: 0

## 2019-11-06 LAB — CBC
HCT: 26.8 % — ABNORMAL LOW (ref 39.0–52.0)
Hemoglobin: 9.3 g/dL — ABNORMAL LOW (ref 13.0–17.0)
MCH: 26.5 pg (ref 26.0–34.0)
MCHC: 34.7 g/dL (ref 30.0–36.0)
MCV: 76.4 fL — ABNORMAL LOW (ref 80.0–100.0)
Platelets: 264 10*3/uL (ref 150–400)
RBC: 3.51 MIL/uL — ABNORMAL LOW (ref 4.22–5.81)
RDW: 17.6 % — ABNORMAL HIGH (ref 11.5–15.5)
WBC: 9.1 10*3/uL (ref 4.0–10.5)
nRBC: 1.4 % — ABNORMAL HIGH (ref 0.0–0.2)

## 2019-11-06 MED ORDER — ISOSORBIDE MONONITRATE ER 30 MG PO TB24
30.0000 mg | ORAL_TABLET | Freq: Every day | ORAL | Status: DC
  Administered 2019-11-06 – 2019-11-08 (×3): 30 mg via ORAL
  Filled 2019-11-06 (×3): qty 1

## 2019-11-06 MED ORDER — TORSEMIDE 20 MG PO TABS
60.0000 mg | ORAL_TABLET | Freq: Two times a day (BID) | ORAL | Status: DC
  Administered 2019-11-06 – 2019-11-08 (×5): 60 mg via ORAL
  Filled 2019-11-06 (×5): qty 3

## 2019-11-06 MED ORDER — BISACODYL 5 MG PO TBEC
20.0000 mg | DELAYED_RELEASE_TABLET | Freq: Once | ORAL | Status: AC
  Administered 2019-11-06: 20 mg via ORAL
  Filled 2019-11-06: qty 4

## 2019-11-06 MED ORDER — METOCLOPRAMIDE HCL 5 MG/ML IJ SOLN
10.0000 mg | Freq: Once | INTRAMUSCULAR | Status: AC
  Administered 2019-11-06: 10 mg via INTRAVENOUS
  Filled 2019-11-06: qty 2

## 2019-11-06 MED ORDER — PEG-KCL-NACL-NASULF-NA ASC-C 100 G PO SOLR
0.5000 | Freq: Once | ORAL | Status: AC
  Administered 2019-11-06: 100 g via ORAL
  Filled 2019-11-06: qty 1

## 2019-11-06 MED ORDER — PEG-KCL-NACL-NASULF-NA ASC-C 100 G PO SOLR: 1.0000 | Freq: Once | ORAL | Status: DC

## 2019-11-06 MED ORDER — PANTOPRAZOLE SODIUM 40 MG PO TBEC
40.0000 mg | DELAYED_RELEASE_TABLET | Freq: Two times a day (BID) | ORAL | Status: DC
  Administered 2019-11-06 – 2019-11-08 (×4): 40 mg via ORAL
  Filled 2019-11-06 (×4): qty 1

## 2019-11-06 MED ORDER — PEG-KCL-NACL-NASULF-NA ASC-C 100 G PO SOLR
0.5000 | Freq: Once | ORAL | Status: AC
  Administered 2019-11-07: 100 g via ORAL

## 2019-11-06 MED ORDER — HYDRALAZINE HCL 10 MG PO TABS
10.0000 mg | ORAL_TABLET | Freq: Three times a day (TID) | ORAL | Status: DC
  Administered 2019-11-06 – 2019-11-08 (×5): 10 mg via ORAL
  Filled 2019-11-06 (×5): qty 1

## 2019-11-06 MED ORDER — METOCLOPRAMIDE HCL 5 MG/ML IJ SOLN
10.0000 mg | Freq: Once | INTRAMUSCULAR | Status: AC
  Administered 2019-11-07: 10 mg via INTRAVENOUS
  Filled 2019-11-06: qty 2

## 2019-11-06 NOTE — Progress Notes (Signed)
   VASCULAR SURGERY ASSESSMENT & PLAN:   CHRONIC MULTILEVEL ARTERIAL OCCLUSIVE DISEASE: The patient has known chronic multilevel arterial occlusive disease based on his previous arteriogram that was done by Dr. Quay Burow.  I was able to obtain a peroneal signal bilaterally.  His hemoglobin was 6.9 on admission and it is certainly possible that the combination of his marginal perfusion with significant anemia resulted in his right lower extremity symptoms.  He has been transfused and denies any leg pain today.  He denies any significant back pain, however I would agree with Dr. [name] that a neurologic source for his symptoms would be in the differential diagnosis.  STAGE 3 CKD: Ideally would be useful to get a CT angiogram however given his chronic kidney disease this is not advisable.  The only option for further work-up of his multilevel disease would be a CO2 arteriogram.  However at this point given that his symptoms have resolved I would hold off on this.  SUBJECTIVE:   No lower extremity symptoms today.  PHYSICAL EXAM:   Vitals:   11/05/19 1903 11/05/19 2022 11/05/19 2227 11/06/19 0512  BP: 104/63  117/66 105/62  Pulse: 66  69 79  Resp: 20  (!) 21 18  Temp: 97.9 F (36.6 C)  (!) 97.4 F (36.3 C) 98 F (36.7 C)  TempSrc: Oral  Oral Oral  SpO2: 97% 97% 100% 93%  Weight:      Height:       Both feet have good temperature.  LABS:   Lab Results  Component Value Date   WBC 9.1 11/06/2019   HGB 9.3 (L) 11/06/2019   HCT 26.8 (L) 11/06/2019   MCV 76.4 (L) 11/06/2019   PLT 264 11/06/2019   Lab Results  Component Value Date   CREATININE 2.30 (H) 11/06/2019   PROBLEM LIST:    Active Problems:   Weakness   CHF (congestive heart failure) (HCC)   CURRENT MEDS:   . atorvastatin  80 mg Oral Daily  . feeding supplement (ENSURE ENLIVE)  237 mL Oral BID BM  . furosemide  80 mg Intravenous BID  . metoprolol tartrate  50 mg Oral BID  . mometasone-formoterol  2 puff  Inhalation BID  . pantoprazole (PROTONIX) IV  40 mg Intravenous Q12H  . polyethylene glycol  17 g Oral Daily  . sodium chloride flush  3 mL Intravenous Q12H    Deitra Mayo Office: [phone] 11/06/2019

## 2019-11-06 NOTE — TOC Initial Note (Signed)
Transition of Care [facility]) - Initial/Assessment Note    Patient Details  Name: Logan White MRN: [id number] Date of Birth: 1941-06-01  Transition of Care [facility]) CM/SW Contact:    Alberteen Sam, LCSW Phone Number: 11/06/2019, 2:03 PM  Clinical Narrative:                  CSW spoke with patient's spouse Mardene Celeste, she reports she lives home with patient and can provide supervision. She states she is agreeable to PT recommendation of Home Health therapies, no preference as to agency at this time.   CSW sent referral to [facility] with Alvis Lemmings, he reports they are able to accept patient for PT, OT services.   Mardene Celeste reports no walker at home and agreeable for walker to be ordered. CSW will reach out to [facility] with [employer] prior to dc to get delivered to room once orders are in.   Patient will need home health orders for PT and OT and DME orders for rolling walker at time of discharge.   Expected Discharge Plan: [place] Barriers to Discharge: Continued Medical Work up   Patient Goals and CMS Choice   CMS Medicare.gov Compare Post Acute Care list provided to:: Patient Represenative (must comment)(Patricia (spouse)) Choice offered to / list presented to : Spouse  Expected Discharge Plan and Services Expected Discharge Plan: [place] Acute Care Choice: [place] arrangements for the past 2 months: Single Family Home                 DME Arranged: Walker rolling DME Agency: [employer] Date DME Agency Contacted: 11/06/19 Time DME Agency Contacted: 47 Representative spoke with at DME Agency: [place] Arranged: PT, OT [place] Agency: [place] Date [place]: 11/06/19 Time [place]: 1400 Representative spoke with at [place]: [place] Arrangements/Services Living arrangements for the past 2 months: [place] Lives with:: Self Patient language and need for interpreter reviewed::  Yes Do you feel safe going back to the place where you live?: Yes      Need for Family Participation in Patient Care: Yes (Comment) Care giver support system in place?: Yes (comment)   Criminal Activity/Legal Involvement Pertinent to Current Situation/Hospitalization: No - Comment as needed  Activities of Daily Living Home Assistive Devices/Equipment: Walker (specify type) ADL Screening (condition at time of admission) Patient's cognitive ability adequate to safely complete daily activities?: Yes Is the patient deaf or have difficulty hearing?: No Does the patient have difficulty seeing, even when wearing glasses/contacts?: No Does the patient have difficulty concentrating, remembering, or making decisions?: No Patient able to express need for assistance with ADLs?: No Does the patient have difficulty dressing or bathing?: Yes Independently performs ADLs?: No Communication: Independent Dressing (OT): Needs assistance Is this a change from baseline?: Change from baseline, expected to last <3days Grooming: Needs assistance Is this a change from baseline?: Change from baseline, expected to last <3 days Feeding: Independent Bathing: Needs assistance Is this a change from baseline?: Change from baseline, expected to last <3 days Toileting: Needs assistance Is this a change from baseline?: Change from baseline, expected to last <3 days In/Out Bed: Needs assistance Is this a change from baseline?: Change from baseline, expected to last <3 days Walks in Home: Independent Does the patient have difficulty walking or climbing stairs?: No Weakness of Legs: None Weakness of Arms/Hands: None  Permission Sought/Granted Permission  sought to share information with : Case Manager, Customer service manager, Family Supports Permission granted to share information with : Yes, Verbal Permission Granted  Share Information with NAME: Mardene Celeste  Permission granted to share info w AGENCY: [place] granted to share info w Relationship: spouse  Permission granted to share info w Contact Information: [phone]  Emotional Assessment         Alcohol / Substance Use: Not Applicable Psych Involvement: No (comment)  Admission diagnosis:  Shortness of breath [R06.02] CHF (congestive heart failure) (HCC) [I50.9] Lower back pain [M54.5] Weakness [R53.1] Acute on chronic systolic (congestive) heart failure ([place]) [I50.23] Patient Active Problem List   Diagnosis Date Noted  . CHF (congestive heart failure) ([place]) 11/04/2019  . CHF (congestive heart failure), NYHA class IV, acute, systolic ([place]) 34/74/2595  . Persistent atrial fibrillation ([place])   . Acute on chronic systolic CHF (congestive heart failure) ([place]) 06/22/2019  . Acute on chronic systolic (congestive) heart failure ([place]) 06/22/2019  . CAD S/P PCI 10/13/2015  . Cardiomyopathy, new. Etiology apeears to be NICM 10/13/2015  . AAA (abdominal aortic aneurysm) ([place])   . Peripheral vascular disease ([place])   . Carotid artery stenosis   . Hypertensive heart disease   . Bruit 10/24/2014  . Acute on chronic renal failure ([place]) 01/11/2013  . Acute MI, anterolateral wall, initial episode of care ([place]) 12/31/2012  . Cardiac arrest due to underlying cardiac condition ([place]) 12/31/2012  . VF (ventricular fibrillation) ([place]) 12/31/2012  . Abnormal LFTs 12/31/2012  . Acute on chronic renal insufficiency 12/31/2012  . Chronic anticoagulation 12/31/2012  . Hypokalemia 12/31/2012  . Weakness 04/23/2012  . Long term (current) use of anticoagulants 03/19/2012  . CKD (chronic kidney disease), stage III 03/16/2012  . Acute confusion/delerium 03/12/2012  . Delirium 03/12/2012  . Acute systolic CHF (congestive heart failure) ([place]) 03/11/2012  . Septic shock(785.52) 02/27/2012    Class: Acute  . Cardiogenic shock ([place]) 02/25/2012  . Acute renal failure ([place]) 02/25/2012  . Anoxic encephalopathy ([place]) 02/25/2012  . Ischemic  cardiomyopathy 02/25/2012  . Cardiac arrest ([place]) 02/23/2012  . Acute respiratory failure with hypoxia ([place]) 02/23/2012  . Ventricular fibrillation ([place]) 02/23/2012  . DM (diabetes mellitus) ([place]) 02/23/2012  . Screening for colon cancer 02/10/2011  . Hematemesis 01/13/2011  . Dyspnea 01/13/2011  . RENAL INSUFFICIENCY 08/26/2010  . HYPERPOTASSEMIA 09/15/2009  . TOBACCO ABUSE 08/11/2009  . GLUCOSE INTOLERANCE 10/02/2008  . Hyperlipidemia 10/02/2008  . Essential hypertension 10/02/2008  . Cerebrovascular disease 10/02/2008  . PERIPHERAL VASCULAR DISEASE 10/02/2008  . INGUINAL HERNIA 10/02/2008   PCP:  Patient, No Pcp Per Pharmacy:   [facility] [place] [address] [place] [address] Phone: [phone] Fax: [phone]  [id number] - [place], [place] [address] Phone: [phone] Fax: [phone]  [facility] [place] [place] [address] Phone: [phone] Fax: [phone]     Social Determinants of Health (SDOH) Interventions    Readmission Risk Interventions Readmission Risk Prevention Plan 09/30/2019  Transportation Screening Complete  PCP or Specialist Appt within 3-5 Days Complete  HRI or [place] Complete  Social Work Consult for [place] Planning/Counseling Complete  Palliative Care Screening Not Applicable  Medication Review Press photographer) Complete  Some recent data might be hidden

## 2019-11-06 NOTE — Progress Notes (Signed)
Physical Therapy Treatment Patient Details Name: Logan White MRN: [id number] DOB: 1941/06/06 Today's Date: 11/06/2019    History of Present Illness  Logan White is a 79 y.o. male with medical history significant of chronic systolic CHF, hypertension, CVA, AAA, CKD stage III, chronic hypoxic respiratory failure on 2 L of oxygen on all time, presented to the ED via EMS with a chief complaint of right lower extremity weakness and pain. Patient has been having problem with the right leg for some time, he said sometimes he feels intermittent "pins-and-needles feeling" from side of right thigh to big toe, usually happens prolonged standing, and if he walks, within 3 to 5 minutes he will feel very heavy on the right leg,    PT Comments    Pt in bed with wife present upon arrival of PT, agreeable to PT session despite reports of some pain in RLE. The pt continues to present with limitations in functional mobility, stability, and activity tolerance compared to their prior level of function and independence due to above dx and pain in RLE. The pt demoed improved stability with use of RW, and reported less increase in pain with ambulation with use of RW, but as pt was not using RW PTA, PT will continue to work with pt to progress functional mobility and independence with mobility.     Follow Up Recommendations  Home health PT;Supervision/Assistance - 24 hour     Equipment Recommendations  Rolling walker with 5" wheels    Recommendations for Other Services       Precautions / Restrictions Precautions Precautions: Fall Precaution Comments:  R LE numbness/heaviness with ambulation Restrictions Weight Bearing Restrictions: No    Mobility  Bed Mobility Overal bed mobility: Modified Independent             General bed mobility comments: pt able to move into and out of bed without assist, but will get into bed sideways without cues. reports he is uncomfortable laying in bed and  therefore just lays sideways  Transfers Overall transfer level: Needs assistance Equipment used: Rolling walker (2 wheeled) Transfers: Sit to/from Stand Sit to Stand: Supervision         General transfer comment: supervision for safety  Ambulation/Gait Ambulation/Gait assistance: Min guard Gait Distance (Feet): 150 Feet Assistive device: Rolling walker (2 wheeled)(pt could try without RW. improved stability and reduced reports of pain with RW) Gait Pattern/deviations: Step-through pattern Gait velocity: slow   General Gait Details: pt with improved tolerance for ambulation with RW, no LOB or need to stop.   Stairs             Wheelchair Mobility    Modified Rankin (Stroke Patients Only)       Balance Overall balance assessment: Mild deficits observed, not formally tested                                          Cognition Arousal/Alertness: Awake/alert Behavior During Therapy: WFL for tasks assessed/performed Overall Cognitive Status: Within Functional Limits for tasks assessed                                        Exercises      General Comments        Pertinent Vitals/Pain Pain Assessment: Faces Faces Pain  Scale: Hurts little more Pain Location: R LE Pain Descriptors / Indicators: Heaviness;Pins and needles;Numbness;Tingling Pain Intervention(s): Limited activity within patient's tolerance;Monitored during session    Home Living                      Prior Function            PT Goals (current goals can now be found in the care plan section) Acute Rehab PT Goals Patient Stated Goal: improve R LE PT Goal Formulation: With patient Time For Goal Achievement: 11/19/19 Potential to Achieve Goals: Good Progress towards PT goals: Progressing toward goals    Frequency    Min 3X/week      PT Plan Current plan remains appropriate    Co-evaluation              AM-PAC PT "6 Clicks" Mobility    Outcome Measure  Help needed turning from your back to your side while in a flat bed without using bedrails?: None Help needed moving from lying on your back to sitting on the side of a flat bed without using bedrails?: None Help needed moving to and from a bed to a chair (including a wheelchair)?: A Little Help needed standing up from a chair using your arms (e.g., wheelchair or bedside chair)?: A Little Help needed to walk in hospital room?: A Little Help needed climbing 3-5 steps with a railing? : A Little 6 Click Score: 20    End of Session Equipment Utilized During Treatment: Gait belt Activity Tolerance: Patient tolerated treatment well;Other (comment)(MD arrived to discuss procedure pt is undergoing tomorrow) Patient left: in bed;with call bell/phone within reach;with bed alarm set;with family/visitor present;with nursing/sitter in room Nurse Communication: Mobility status PT Visit Diagnosis: Unsteadiness on feet (R26.81);Difficulty in walking, not elsewhere classified (R26.2)     Time: 3016-0109 PT Time Calculation (min) (ACUTE ONLY): 15 min  Charges:  $Gait Training: 8-22 mins                     Karma Ganja, PT, DPT   Acute Rehabilitation Department Pager #: [phone]   Otho Bellows 11/06/2019, 4:12 PM

## 2019-11-06 NOTE — Progress Notes (Signed)
Progress Note  Patient Name: Logan White Date of Encounter: 11/06/2019  Primary Cardiologist: Kirk Ruths, MD   Subjective   No dyspnea or chest pain  Inpatient Medications    Scheduled Meds:  atorvastatin  80 mg Oral Daily   feeding supplement (ENSURE ENLIVE)  237 mL Oral BID BM   furosemide  80 mg Intravenous BID   metoprolol tartrate  50 mg Oral BID   mometasone-formoterol  2 puff Inhalation BID   pantoprazole (PROTONIX) IV  40 mg Intravenous Q12H   polyethylene glycol  17 g Oral Daily   sodium chloride flush  3 mL Intravenous Q12H   Continuous Infusions:  sodium chloride     PRN Meds: sodium chloride, acetaminophen, albuterol, guaiFENesin-dextromethorphan, ondansetron (ZOFRAN) IV, oxyCODONE, sodium chloride flush   Vital Signs    Vitals:   11/05/19 2022 11/05/19 2227 11/06/19 0512 11/06/19 0743  BP:  117/66 105/62   Pulse:  69 79   Resp:  (!) 21 18   Temp:  (!) 97.4 F (36.3 C) 98 F (36.7 C)   TempSrc:  Oral Oral   SpO2: 97% 100% 93% 91%  Weight:      Height:        Intake/Output Summary (Last 24 hours) at 11/06/2019 0844 Last data filed at 11/06/2019 0504 Gross per 24 hour  Intake 1571 ml  Output 1650 ml  Net -79 ml   Last 3 Weights 11/05/2019 11/04/2019 11/04/2019  Weight (lbs) 183 lb 3.2 oz 180 lb 1.9 oz 180 lb 1.9 oz  Weight (kg) 83.099 kg 81.7 kg 81.7 kg  Some encounter information is confidential and restricted. Go to Review Flowsheets activity to see all data.      Telemetry    Pt refused telemetry last PM  Physical Exam   GEN: WD/WN NAD  Neck: No JVD, supple Cardiac: irregular Respiratory: CTA GI: Soft, NT/ND MS: trace to 1+ edema Neuro:  Grossly intact Psych: Normal affect   Labs   Chemistry Recent Labs  Lab 11/04/19 1241 11/05/19 0530 11/06/19 0023  NA 133* 133* 135  K 5.2* 4.7 4.3  CL 99 101 99  CO2 25 23 25   GLUCOSE 115* 117* 97  BUN 46* 54* 47*  CREATININE 2.41* 2.66* 2.30*  CALCIUM 8.3* 8.2* 8.4*    PROT 5.6*  --   --   ALBUMIN 2.9*  --   --   AST 29  --   --   ALT 20  --   --   ALKPHOS 124  --   --   BILITOT 1.4*  --   --   GFRNONAA 25* 22* 27*  GFRAA 29* 26* 31*  ANIONGAP 9 9 11      Hematology Recent Labs  Lab 11/04/19 1944 11/05/19 0910 11/06/19 0023  WBC 5.3 5.6 9.1  RBC 2.79* 2.77* 3.51*  HGB 7.0* 6.9* 9.3*  HCT 21.0* 20.6* 26.8*  MCV 75.3* 74.4* 76.4*  MCH 25.1* 24.9* 26.5  MCHC 33.3 33.5 34.7  RDW 17.2* 17.0* 17.6*  PLT 257 252 264    BNP Recent Labs  Lab 11/04/19 1241  BNP 1,655.3*     Radiology    DG Lumbar Spine 2-3 Views  Result Date: 11/04/2019 CLINICAL DATA:  Right lower extremity weakness EXAM: LUMBAR SPINE - 2-3 VIEW COMPARISON:  None. FINDINGS: Sagittal alignment is within normal limits. Vertebral body heights are grossly maintained. Moderate severe degenerative change at L5-S1. Mild osteophytes at most levels throughout the lumbar spine. Dense aortic  atherosclerosis. Ectatic distal abdominal aorta measuring up to 3.2 cm. IMPRESSION: 1. No acute osseous abnormality. 2. Degenerative changes most advanced at L5-S1 3. Dense aortic atherosclerosis. AP diameter of calcified distal abdominal aorta up to 3.2 cm. This may be more thoroughly evaluated with CT, which could be performed on a nonemergent basis unless otherwise clinically indicated. Electronically Signed   By: Donavan Foil M.D.   On: 11/04/2019 17:03   CT Head Wo Contrast  Result Date: 11/04/2019 CLINICAL DATA:  The patient's legs gave way today but the patient did not fall. Initial encounter. EXAM: CT HEAD WITHOUT CONTRAST TECHNIQUE: Contiguous axial images were obtained from the base of the skull through the vertex without intravenous contrast. COMPARISON:  Head CT scan 09/28/2019. FINDINGS: Brain: No evidence of acute infarction, hemorrhage, hydrocephalus, extra-axial collection or mass lesion/mass effect. Atrophy and chronic microvascular ischemic change noted. Vascular: Atherosclerosis. Skull:  Intact. No focal lesion. Sinuses/Orbits: Negative. Other: None. IMPRESSION: 1. No acute abnormality. 2. Atrophy and chronic microvascular ischemic change. 3. Atherosclerosis. Electronically Signed   By: Inge Rise M.D.   On: 11/04/2019 14:26   MR BRAIN WO CONTRAST  Result Date: 11/05/2019 CLINICAL DATA:  TIA EXAM: MRI HEAD WITHOUT CONTRAST TECHNIQUE: Multiplanar, multiecho pulse sequences of the brain and surrounding structures were obtained without intravenous contrast. COMPARISON:  CT head 11/04/2019.  MRI head 01/04/2013 FINDINGS: Brain: Negative for acute infarct. Mild to moderate atrophy with mild chronic microvascular ischemic change in the white matter and pons. No cortical infarct. Chronic microhemorrhage right posterior temporal lobe. Otherwise no hemorrhage or mass. No shift of the midline structures. Vascular: Normal arterial flow voids. Skull and upper cervical spine: No focal skeletal abnormality. Lipoma over the convexity is chronic and benign appearing. Sinuses/Orbits: Mild mucosal edema paranasal sinuses. Negative orbit Other: None IMPRESSION: No acute abnormality. Atrophy and mild chronic microvascular ischemia. Electronically Signed   By: Franchot Gallo M.D.   On: 11/05/2019 18:48   DG Chest Portable 1 View  Result Date: 11/04/2019 CLINICAL DATA:  Right lower extremity weakness and numbness last night for 30 minutes and again today at 10:30 this morning. EXAM: PORTABLE CHEST 1 VIEW COMPARISON:  Single-view of the chest 09/28/2019. PA and lateral chest 06/22/2019. FINDINGS: There is cardiomegaly and pulmonary edema. Small bilateral pleural effusions. No pneumothorax. Atherosclerosis. IMPRESSION: Congestive heart failure. Atherosclerosis. Electronically Signed   By: Inge Rise M.D.   On: 11/04/2019 12:53   VAS [place] ABI WITH/WO TBI  Result Date: 11/05/2019 LOWER EXTREMITY DOPPLER STUDY Indications: Claudication, and peripheral artery disease.  Limitations: Today's exam was limited  due to involuntary patient movement. Comparison Study: AAA ultrasound 03-15-19. Performing Technologist: Baldwin Crown RDMS  Examination Guidelines: A complete evaluation includes at minimum, Doppler waveform signals and systolic blood pressure reading at the level of bilateral brachial, anterior tibial, and posterior tibial arteries, when vessel segments are accessible. Bilateral testing is considered an integral part of a complete examination. Photoelectric Plethysmograph (PPG) waveforms and toe systolic pressure readings are included as required and additional duplex testing as needed. Limited examinations for reoccurring indications may be performed as noted.  ABI Findings: +---------+------------------+-----+---------+--------+  Right     Rt Pressure (mmHg) Index Waveform  Comment   +---------+------------------+-----+---------+--------+  Brachial  120                      triphasic           +---------+------------------+-----+---------+--------+  PTA  absent              +---------+------------------+-----+---------+--------+  DP                                 absent              +---------+------------------+-----+---------+--------+  Great Toe                          Absent              +---------+------------------+-----+---------+--------+ +---------+------------------+-----+---------+---------------+  Left      Lt Pressure (mmHg) Index Waveform  Comment          +---------+------------------+-----+---------+---------------+  Brachial  122                      triphasic                  +---------+------------------+-----+---------+---------------+  PTA       43                 0.35            possibly venous  +---------+------------------+-----+---------+---------------+  DP                                           possibly venous  +---------+------------------+-----+---------+---------------+  Great Toe                          Absent                      +---------+------------------+-----+---------+---------------+  Summary: Right: Unable to innsonate PTA or DP due to low velocity vs nonexistant waveforms. Left: Resting left ankle-brachial index indicates severe left lower extremity arterial disease. Due to irregularity of waveforms unable to distingusihed between arterial vs venous flow.  *See table(s) above for measurements and observations.  Electronically signed by Deitra Mayo MD on 11/05/2019 at 4:42:49 PM.   Final     Patient Profile     79 y.o. male with past medical history of coronary disease, ischemic cardiomyopathy, mitral regurgitation, previous ventricular fibrillation arrest, chronic systolic congestive heart failure, chronic stage III kidney disease, peripheral vascular disease admitted with acute on chronic systolic congestive heart failure and intermittent right lower extremity numbness, weakness and pain. Echocardiogram January 2021 showed ejection fraction 25 to 30%, mild left ventricular hypertrophy, severe left ventricular enlargement, severe RV dysfunction, biatrial enlargement, moderate mitral regurgitation and mild aortic insufficiency. Right heart cath January 2021 showed pulmonary capillary wedge pressure 28, PA pressure 63/29 and RA pressure of 20.   Assessment & Plan    1 acute on chronic systolic congestive heart failure-volume status improved this morning.  We will discontinue IV Lasix and resume home dose of Demadex at 60 mg twice daily.  Follow renal function.   2 right lower extremity pain, transient numbness, transient weakness-etiology remains unclear to me.  MRI is negative.  It is possible that patient became anemic from his GI bleed which exacerbated his vascular insufficiency to the right lower extremity.  However presentation would be unusual as he states he had sudden loss of use of his right lower extremity.  His symptoms have improved.  Vascular surgery has seen and  medical therapy has been recommended.      3 ischemic cardiomyopathy-continue beta-blocker, hydralazine and nitrates.  No ARB or Entresto given renal insufficiency.  4 mitral regurgitation-moderate on recent transesophageal echocardiogram.  5 chronic stage III kidney disease-renal function improved following transfusion.  Continue to follow.  6 permanent atrial fibrillation-continue beta-blocker for rate control.    Apixaban held due to GI bleeding.  7 anemia-GI following and plans EGD and colonoscopy.  Apixaban and aspirin on hold.  Hemoglobin improved following transfusion.  Continue to follow hemoglobin.  Continue Protonix.  For questions or updates, please contact [place] Please consult [email or web address] for contact info under        Signed, Kirk Ruths, MD  11/06/2019, 8:44 AM

## 2019-11-06 NOTE — Consult Note (Signed)
   [facility] Inpatient Consult   11/06/2019  LORENA CLEARMAN 1940/09/16 [id number]  Patient screened for high risk score for unplanned readmission with less than 30 day readmission hospitalization.  Chart review to check if potential [facility] services are needed with [facility].   Primary Care Provider: No primary care provider noted   Plan:  Will sign off as no primary care provider noted only Cardiology specialist.  For questions contact:   Natividad Brood, RN BSN [facility] Liaison  [phone] business mobile phone Toll free office [phone]  Fax number: [phone] [email or web address]

## 2019-11-06 NOTE — H&P (View-Only) (Signed)
Daily Rounding Note  11/06/2019, 10:49 AM  LOS: 2 days   SUBJECTIVE:   Chief complaint: Melena, FOBT positive, transfusion requiring anemia.  Constipation. Patient feels a little bit tired.  Does not feel short of breath.  No pain. Asking if he can have solid food. Last bowel movement was on 3/1 when he had the melanoma.  OBJECTIVE:         Vital signs in last 24 hours:    Temp:  [97.4 F (36.3 C)-98 F (36.7 C)] 98 F (36.7 C) (03/03 0512) Pulse Rate:  [66-83] 79 (03/03 0512) Resp:  [16-21] 18 (03/03 0512) BP: (90-120)/(61-89) 105/62 (03/03 0512) SpO2:  [91 %-100 %] 91 % (03/03 0743) Last BM Date: 11/04/19 Filed Weights   11/04/19 1630 11/04/19 1740 11/05/19 0014  Weight: 81.7 kg 81.7 kg 83.1 kg   General: Looks tired, somewhat chronically ill.  Resting comfortably in bed. Heart: RRR. Chest: Rhonchi and wheezing bilaterally.  Hoarse vocal quality. Abdomen: Soft, nontender, nondistended.  No HSM, masses, bruits, hernias Extremities: No CCE. Neuro/Psych: Moves all 4 limbs.  No tremors.  Intake/Output from previous day: 03/02 0701 - 03/03 0700 In: 6767 [P.O.:1180; I.V.:53; Blood:338] Out: 2094 [Urine:1650]  Intake/Output this shift: No intake/output data recorded.  Lab Results: Recent Labs    11/04/19 1944 11/05/19 0910 11/06/19 0023  WBC 5.3 5.6 9.1  HGB 7.0* 6.9* 9.3*  HCT 21.0* 20.6* 26.8*  PLT 257 252 264   BMET Recent Labs    11/04/19 1241 11/05/19 0530 11/06/19 0023  NA 133* 133* 135  K 5.2* 4.7 4.3  CL 99 101 99  CO2 25 23 25   GLUCOSE 115* 117* 97  BUN 46* 54* 47*  CREATININE 2.41* 2.66* 2.30*  CALCIUM 8.3* 8.2* 8.4*   LFT Recent Labs    11/04/19 1241  PROT 5.6*  ALBUMIN 2.9*  AST 29  ALT 20  ALKPHOS 124  BILITOT 1.4*   PT/INR No results for input(s): LABPROT, INR in the last 72 hours. Hepatitis Panel No results for input(s): HEPBSAG, HCVAB, HEPAIGM, HEPBIGM in the last  72 hours.  Studies/Results: DG Lumbar Spine 2-3 Views  Result Date: 11/04/2019 CLINICAL DATA:  Right lower extremity weakness EXAM: LUMBAR SPINE - 2-3 VIEW COMPARISON:  None. FINDINGS: Sagittal alignment is within normal limits. Vertebral body heights are grossly maintained. Moderate severe degenerative change at L5-S1. Mild osteophytes at most levels throughout the lumbar spine. Dense aortic atherosclerosis. Ectatic distal abdominal aorta measuring up to 3.2 cm. IMPRESSION: 1. No acute osseous abnormality. 2. Degenerative changes most advanced at L5-S1 3. Dense aortic atherosclerosis. AP diameter of calcified distal abdominal aorta up to 3.2 cm. This may be more thoroughly evaluated with CT, which could be performed on a nonemergent basis unless otherwise clinically indicated. Electronically Signed   By: Donavan Foil M.D.   On: 11/04/2019 17:03   CT Head Wo Contrast  Result Date: 11/04/2019 CLINICAL DATA:  The patient's legs gave way today but the patient did not fall. Initial encounter. EXAM: CT HEAD WITHOUT CONTRAST TECHNIQUE: Contiguous axial images were obtained from the base of the skull through the vertex without intravenous contrast. COMPARISON:  Head CT scan 09/28/2019. FINDINGS: Brain: No evidence of acute infarction, hemorrhage, hydrocephalus, extra-axial collection or mass lesion/mass effect. Atrophy and chronic microvascular ischemic change noted. Vascular: Atherosclerosis. Skull: Intact. No focal lesion. Sinuses/Orbits: Negative. Other: None. IMPRESSION: 1. No acute abnormality. 2. Atrophy and chronic microvascular ischemic change. 3. Atherosclerosis.  Electronically Signed   By: Inge Rise M.D.   On: 11/04/2019 14:26   MR BRAIN WO CONTRAST  Result Date: 11/05/2019 CLINICAL DATA:  TIA EXAM: MRI HEAD WITHOUT CONTRAST TECHNIQUE: Multiplanar, multiecho pulse sequences of the brain and surrounding structures were obtained without intravenous contrast. COMPARISON:  CT head 11/04/2019.  MRI  head 01/04/2013 FINDINGS: Brain: Negative for acute infarct. Mild to moderate atrophy with mild chronic microvascular ischemic change in the white matter and pons. No cortical infarct. Chronic microhemorrhage right posterior temporal lobe. Otherwise no hemorrhage or mass. No shift of the midline structures. Vascular: Normal arterial flow voids. Skull and upper cervical spine: No focal skeletal abnormality. Lipoma over the convexity is chronic and benign appearing. Sinuses/Orbits: Mild mucosal edema paranasal sinuses. Negative orbit Other: None IMPRESSION: No acute abnormality. Atrophy and mild chronic microvascular ischemia. Electronically Signed   By: Franchot Gallo M.D.   On: 11/05/2019 18:48   DG Chest Portable 1 View  Result Date: 11/04/2019 CLINICAL DATA:  Right lower extremity weakness and numbness last night for 30 minutes and again today at 10:30 this morning. EXAM: PORTABLE CHEST 1 VIEW COMPARISON:  Single-view of the chest 09/28/2019. PA and lateral chest 06/22/2019. FINDINGS: There is cardiomegaly and pulmonary edema. Small bilateral pleural effusions. No pneumothorax. Atherosclerosis. IMPRESSION: Congestive heart failure. Atherosclerosis. Electronically Signed   By: Inge Rise M.D.   On: 11/04/2019 12:53   VAS [place] ABI WITH/WO TBI  Result Date: 11/05/2019 Summary: Right: Unable to innsonate PTA or DP due to low velocity vs nonexistant waveforms. Left: Resting left ankle-brachial index indicates severe left lower extremity arterial disease. Due to irregularity of waveforms unable to distingusihed between arterial vs venous flow. Electronically signed by Deitra Mayo MD on 11/05/2019 at 4:42:49 PM.   Final    Scheduled Meds:  atorvastatin  80 mg Oral Daily   bisacodyl  20 mg Oral Once   feeding supplement (ENSURE ENLIVE)  237 mL Oral BID BM   hydrALAZINE  10 mg Oral TID   isosorbide mononitrate  30 mg Oral Daily   metoprolol tartrate  50 mg Oral BID   mometasone-formoterol  2  puff Inhalation BID   pantoprazole (PROTONIX) IV  40 mg Intravenous Q12H   polyethylene glycol  17 g Oral Daily   sodium chloride flush  3 mL Intravenous Q12H   torsemide  60 mg Oral BID   Continuous Infusions:  sodium chloride     PRN Meds:.sodium chloride, acetaminophen, albuterol, guaiFENesin-dextromethorphan, ondansetron (ZOFRAN) IV, oxyCODONE, sodium chloride flush   ASSESMENT:   *   Microcytic anemia. Hgb 6.9 >> 9.3.     *   Melena, GI bleed.  *   Acute on chronic CHF in setting of acute anemia.  After [place] discontinued IV Lasix and resumed home dose bid Demadex today.  *   Chronic Eliquis, low dose for A fib.  Last doses 11/04/2019 in AM.  *   Hepatic cysts, fatty liver per CT and ultrasound several years ago.  No indication he has advanced liver disease.  LFTs, platelets normal.  *    ASPVD.  Right lower extremity pain, transient weakness and numbness of unclear etiology.  Per Dr. Scot Dock, CT angiogram not advisable due to kidney disease.  CO2 arteriogram is an option but MD does not plan to pursue given resolution of sxs.  *   AKI.  Improved.  Baseline CKD stage 3b.    PLAN   *    Plan colonoscopy, EGD 2  PM tomorrow.  Dulcolax, Reglan, split dose moviprep.  Pt agreeable to proceed.    *   Switch to oral Protonix bid.      Azucena Freed  11/06/2019, 10:49 AM Phone [phone]   Attending physician's note   I have taken an interval history, reviewed the chart and examined the patient. I agree with the Advanced Practitioner's note, impression and recommendations.   24 yr M with severe CHF on chronic Eliquis admitted with melena and worsening anemia.  Plan for EGD and colonoscopy tomorrow  The risks and benefits as well as alternatives of endoscopic procedure(s) have been discussed and reviewed. All questions answered. The patient agrees to proceed.   Damaris Hippo , MD [phone]

## 2019-11-06 NOTE — Progress Notes (Addendum)
Pt BP 108/69 HR 67.  APP paged regarding both hydralazine and metoprolol scheduled.   2240-APP stated to hold hydralazine and give metoprolol if MAP was above 65.

## 2019-11-06 NOTE — Progress Notes (Addendum)
Daily Rounding Note  11/06/2019, 10:49 AM  LOS: 2 days   SUBJECTIVE:   Chief complaint: Melena, FOBT positive, transfusion requiring anemia.  Constipation. Patient feels a little bit tired.  Does not feel short of breath.  No pain. Asking if he can have solid food. Last bowel movement was on 3/1 when he had the melanoma.  OBJECTIVE:         Vital signs in last 24 hours:    Temp:  [97.4 F (36.3 C)-98 F (36.7 C)] 98 F (36.7 C) (03/03 0512) Pulse Rate:  [66-83] 79 (03/03 0512) Resp:  [16-21] 18 (03/03 0512) BP: (90-120)/(61-89) 105/62 (03/03 0512) SpO2:  [91 %-100 %] 91 % (03/03 0743) Last BM Date: 11/04/19 Filed Weights   11/04/19 1630 11/04/19 1740 11/05/19 0014  Weight: 81.7 kg 81.7 kg 83.1 kg   General: Looks tired, somewhat chronically ill.  Resting comfortably in bed. Heart: RRR. Chest: Rhonchi and wheezing bilaterally.  Hoarse vocal quality. Abdomen: Soft, nontender, nondistended.  No HSM, masses, bruits, hernias Extremities: No CCE. Neuro/Psych: Moves all 4 limbs.  No tremors.  Intake/Output from previous day: 03/02 0701 - 03/03 0700 In: 5465 [P.O.:1180; I.V.:53; Blood:338] Out: 0354 [Urine:1650]  Intake/Output this shift: No intake/output data recorded.  Lab Results: Recent Labs    11/04/19 1944 11/05/19 0910 11/06/19 0023  WBC 5.3 5.6 9.1  HGB 7.0* 6.9* 9.3*  HCT 21.0* 20.6* 26.8*  PLT 257 252 264   BMET Recent Labs    11/04/19 1241 11/05/19 0530 11/06/19 0023  NA 133* 133* 135  K 5.2* 4.7 4.3  CL 99 101 99  CO2 25 23 25   GLUCOSE 115* 117* 97  BUN 46* 54* 47*  CREATININE 2.41* 2.66* 2.30*  CALCIUM 8.3* 8.2* 8.4*   LFT Recent Labs    11/04/19 1241  PROT 5.6*  ALBUMIN 2.9*  AST 29  ALT 20  ALKPHOS 124  BILITOT 1.4*   PT/INR No results for input(s): LABPROT, INR in the last 72 hours. Hepatitis Panel No results for input(s): HEPBSAG, HCVAB, HEPAIGM, HEPBIGM in the last  72 hours.  Studies/Results: DG Lumbar Spine 2-3 Views  Result Date: 11/04/2019 CLINICAL DATA:  Right lower extremity weakness EXAM: LUMBAR SPINE - 2-3 VIEW COMPARISON:  None. FINDINGS: Sagittal alignment is within normal limits. Vertebral body heights are grossly maintained. Moderate severe degenerative change at L5-S1. Mild osteophytes at most levels throughout the lumbar spine. Dense aortic atherosclerosis. Ectatic distal abdominal aorta measuring up to 3.2 cm. IMPRESSION: 1. No acute osseous abnormality. 2. Degenerative changes most advanced at L5-S1 3. Dense aortic atherosclerosis. AP diameter of calcified distal abdominal aorta up to 3.2 cm. This may be more thoroughly evaluated with CT, which could be performed on a nonemergent basis unless otherwise clinically indicated. Electronically Signed   By: Donavan Foil M.D.   On: 11/04/2019 17:03   CT Head Wo Contrast  Result Date: 11/04/2019 CLINICAL DATA:  The patient's legs gave way today but the patient did not fall. Initial encounter. EXAM: CT HEAD WITHOUT CONTRAST TECHNIQUE: Contiguous axial images were obtained from the base of the skull through the vertex without intravenous contrast. COMPARISON:  Head CT scan 09/28/2019. FINDINGS: Brain: No evidence of acute infarction, hemorrhage, hydrocephalus, extra-axial collection or mass lesion/mass effect. Atrophy and chronic microvascular ischemic change noted. Vascular: Atherosclerosis. Skull: Intact. No focal lesion. Sinuses/Orbits: Negative. Other: None. IMPRESSION: 1. No acute abnormality. 2. Atrophy and chronic microvascular ischemic change. 3. Atherosclerosis.  Electronically Signed   By: Inge Rise M.D.   On: 11/04/2019 14:26   MR BRAIN WO CONTRAST  Result Date: 11/05/2019 CLINICAL DATA:  TIA EXAM: MRI HEAD WITHOUT CONTRAST TECHNIQUE: Multiplanar, multiecho pulse sequences of the brain and surrounding structures were obtained without intravenous contrast. COMPARISON:  CT head 11/04/2019.  MRI  head 01/04/2013 FINDINGS: Brain: Negative for acute infarct. Mild to moderate atrophy with mild chronic microvascular ischemic change in the white matter and pons. No cortical infarct. Chronic microhemorrhage right posterior temporal lobe. Otherwise no hemorrhage or mass. No shift of the midline structures. Vascular: Normal arterial flow voids. Skull and upper cervical spine: No focal skeletal abnormality. Lipoma over the convexity is chronic and benign appearing. Sinuses/Orbits: Mild mucosal edema paranasal sinuses. Negative orbit Other: None IMPRESSION: No acute abnormality. Atrophy and mild chronic microvascular ischemia. Electronically Signed   By: Franchot Gallo M.D.   On: 11/05/2019 18:48   DG Chest Portable 1 View  Result Date: 11/04/2019 CLINICAL DATA:  Right lower extremity weakness and numbness last night for 30 minutes and again today at 10:30 this morning. EXAM: PORTABLE CHEST 1 VIEW COMPARISON:  Single-view of the chest 09/28/2019. PA and lateral chest 06/22/2019. FINDINGS: There is cardiomegaly and pulmonary edema. Small bilateral pleural effusions. No pneumothorax. Atherosclerosis. IMPRESSION: Congestive heart failure. Atherosclerosis. Electronically Signed   By: Inge Rise M.D.   On: 11/04/2019 12:53   VAS [place] ABI WITH/WO TBI  Result Date: 11/05/2019 Summary: Right: Unable to innsonate PTA or DP due to low velocity vs nonexistant waveforms. Left: Resting left ankle-brachial index indicates severe left lower extremity arterial disease. Due to irregularity of waveforms unable to distingusihed between arterial vs venous flow. Electronically signed by Deitra Mayo MD on 11/05/2019 at 4:42:49 PM.   Final    Scheduled Meds: . atorvastatin  80 mg Oral Daily  . bisacodyl  20 mg Oral Once  . feeding supplement (ENSURE ENLIVE)  237 mL Oral BID BM  . hydrALAZINE  10 mg Oral TID  . isosorbide mononitrate  30 mg Oral Daily  . metoprolol tartrate  50 mg Oral BID  . mometasone-formoterol  2  puff Inhalation BID  . pantoprazole (PROTONIX) IV  40 mg Intravenous Q12H  . polyethylene glycol  17 g Oral Daily  . sodium chloride flush  3 mL Intravenous Q12H  . torsemide  60 mg Oral BID   Continuous Infusions: . sodium chloride     PRN Meds:.sodium chloride, acetaminophen, albuterol, guaiFENesin-dextromethorphan, ondansetron (ZOFRAN) IV, oxyCODONE, sodium chloride flush   ASSESMENT:   *   Microcytic anemia. Hgb 6.9 >> 9.3.     *   Melena, GI bleed.  *   Acute on chronic CHF in setting of acute anemia.  After [place] discontinued IV Lasix and resumed home dose bid Demadex today.  *   Chronic Eliquis, low dose for A fib.  Last doses 11/04/2019 in AM.  *   Hepatic cysts, fatty liver per CT and ultrasound several years ago.  No indication he has advanced liver disease.  LFTs, platelets normal.  *    ASPVD.  Right lower extremity pain, transient weakness and numbness of unclear etiology.  Per Dr. Scot Dock, CT angiogram not advisable due to kidney disease.  CO2 arteriogram is an option but MD does not plan to pursue given resolution of sxs.  *   AKI.  Improved.  Baseline CKD stage 3b.    PLAN   *    Plan colonoscopy, EGD 2  PM tomorrow.  Dulcolax, Reglan, split dose moviprep.  Pt agreeable to proceed.    *   Switch to oral Protonix bid.      Azucena Freed  11/06/2019, 10:49 AM Phone [phone]   Attending physician's note   I have taken an interval history, reviewed the chart and examined the patient. I agree with the Advanced Practitioner's note, impression and recommendations.   76 yr M with severe CHF on chronic Eliquis admitted with melena and worsening anemia.  Plan for EGD and colonoscopy tomorrow  The risks and benefits as well as alternatives of endoscopic procedure(s) have been discussed and reviewed. All questions answered. The patient agrees to proceed.   Damaris Hippo , MD [phone]

## 2019-11-06 NOTE — Progress Notes (Addendum)
PROGRESS NOTE    Logan White  EOF:[id number] DOB: [birth year] DOA: 11/04/2019 PCP: Patient, No Pcp Per  Brief Narrative:  Logan White is a 79 y.o. M with sCHF EF 25-30%, CAD with isch CM, hx VF arrest no ICD, chronic hypoxic respiratory failure on 2 L, HTN, hx CVA, AAA, CKD stage IV baseline Cr 1.8-2.5, hx cardiac arrest, PVD carotid, persistent AF on Eliquis, and PVD carotids and bilateral LE who presented with pain in the right leg. -In the emergency room he was found to have severe anemia with hemoglobin in the 6 range with heme positive stools and report of melena  Assessment & Plan:  Right leg weakness and pain Patient presented with right leg weakness, pain, poor historian -MRI negative for CVA, appreciate vascular input, suspected to have transient worsening of his PAD symptoms in the setting of anemia  -No symptoms at this time  -Physical therapy evaluation  -last LE angiography in 2017 he had "occluded R external iliac and occluded L SFA" both not percutaneously addressable.  Acute on chronic CHF/ischemic cardiomyopathy, EF 25% Pulmonary HTN, group 2 Severe right heart failure Coronary disease, cerebrovascular disease and peripheral vascular disease History of VF arrest Echo 1 month ago showed EF 25 to 30%. Severe RHF, pHTN.  RHC showed PCWP 28 -Improving with diuresis, close to euvolemic, cardiology following -Metoprolol, Imdur, hydralazine  Severe anemia Suspected GI blood loss anemia Patient reports melena, NSAID use.  Melena on exam. -Eliquis held, continue IV PPI, GI following  -Continue clear liquids, plan for EGD tomorrow   Chronic hypoxic respiratory failure COPD -Continue home O2 -Continue ICS-LABA  CKD stage IV Cr slightly up relative to baseline.  Baseline GFR in last few months is 25-35. -Creatinine stable, monitor with diuresis, avoid hypotension  Persistent atrial fibrillation -Continue metoprolol -Eliquis on hold pending GI work-up  Moderate  protein calorie malnutrition As evidenced by weight loss, diminished muscle mass and fat, BMI 22, and chronic organ failure. -Continue Ensure  Hyponatremia Mild -Repeat BMP  DVT prophylaxis: SCDs Code Status: FULL Family Communication: No family at bedside will attempt to update spouse later today Disposition: Home pending GI work-up for melena, anemia and improvement in volume status with diuresis    Consultants:   Cardiology  Vascular surgery  Vascular Cardiology  Gastroenterology  Procedures:   3/1 CT head -- NAICP  3/1 x-ray lumbar spine -- nonspecific degenerative changes  3/2 ABI and duplex US bilateral LE -- pending  3/2 MRI brain negative for acute infarct   Subjective: -Denies any complaints today, denies any right leg pain, reports that his breathing is also improving  Objective: Vitals:   11/05/19 2022 11/05/19 2227 11/06/19 0512 11/06/19 0743  BP:  117/66 105/62   Pulse:  69 79   Resp:  (!) 21 18   Temp:  (!) 97.4 F (36.3 C) 98 F (36.7 C)   TempSrc:  Oral Oral   SpO2: 97% 100% 93% 91%  Weight:      Height:        Intake/Output Summary (Last 24 hours) at 11/06/2019 1211 Last data filed at 11/06/2019 1201 Gross per 24 hour  Intake 1451 ml  Output 1851 ml  Net -400 ml   Filed Weights   11/04/19 1630 11/04/19 1740 11/05/19 0014  Weight: 81.7 kg 81.7 kg 83.1 kg    Examination: Gen: Chronically ill elderly male sitting up in bed, AAOx3, no distress HEENT: PERRLA, Neck supple, no JVD Lungs: Decreased breath sounds bases, otherwise  clear CVS: S1-S2, regular rate rhythm Abd: soft, Non tender, non distended, BS present Extremities: No edema Skin: no new rashes Neuro: Awake and alert, cranial nerves II to XII intact, motor 4+ by 5 in all extremities, sensations intact.  Data Reviewed: I have personally reviewed following labs and imaging studies:  CBC: Recent Labs  Lab 11/04/19 1944 11/05/19 0910 11/06/19 0023  WBC 5.3 5.6 9.1   NEUTROABS 3.1  --   --   HGB 7.0* 6.9* 9.3*  HCT 21.0* 20.6* 26.8*  MCV 75.3* 74.4* 76.4*  PLT 257 252 458   Basic Metabolic Panel: Recent Labs  Lab 11/04/19 1241 11/05/19 0530 11/05/19 1225 11/06/19 0023  NA 133* 133*  --  135  K 5.2* 4.7  --  4.3  CL 99 101  --  99  CO2 25 23  --  25  GLUCOSE 115* 117*  --  97  BUN 46* 54*  --  47*  CREATININE 2.41* 2.66*  --  2.30*  CALCIUM 8.3* 8.2*  --  8.4*  MG  --   --  2.9*  --    GFR: Estimated Creatinine Clearance: 32.1 mL/min (A) (by C-G formula based on SCr of 2.3 mg/dL (H)). Liver Function Tests: Recent Labs  Lab 11/04/19 1241  AST 29  ALT 20  ALKPHOS 124  BILITOT 1.4*  PROT 5.6*  ALBUMIN 2.9*   No results for input(s): LIPASE, AMYLASE in the last 168 hours. No results for input(s): AMMONIA in the last 168 hours. Coagulation Profile: No results for input(s): INR, PROTIME in the last 168 hours. Cardiac Enzymes: No results for input(s): CKTOTAL, CKMB, CKMBINDEX, TROPONINI in the last 168 hours. BNP (last 3 results) Recent Labs    08/15/19 0949  PROBNP 16,511*   HbA1C: No results for input(s): HGBA1C in the last 72 hours. CBG: No results for input(s): GLUCAP in the last 168 hours. Lipid Profile: No results for input(s): CHOL, HDL, LDLCALC, TRIG, CHOLHDL, LDLDIRECT in the last 72 hours. Thyroid Function Tests: No results for input(s): TSH, T4TOTAL, FREET4, T3FREE, THYROIDAB in the last 72 hours. Anemia Panel: No results for input(s): VITAMINB12, FOLATE, FERRITIN, TIBC, IRON, RETICCTPCT in the last 72 hours. Urine analysis:    Component Value Date/Time   COLORURINE YELLOW 11/04/2019 1500   APPEARANCEUR CLEAR 11/04/2019 1500   LABSPEC 1.011 11/04/2019 1500   PHURINE 6.0 11/04/2019 1500   GLUCOSEU NEGATIVE 11/04/2019 1500   HGBUR NEGATIVE 11/04/2019 1500   BILIRUBINUR NEGATIVE 11/04/2019 1500   KETONESUR NEGATIVE 11/04/2019 1500   PROTEINUR NEGATIVE 11/04/2019 1500   UROBILINOGEN 0.2 03/23/2013 1740    NITRITE NEGATIVE 11/04/2019 1500   LEUKOCYTESUR NEGATIVE 11/04/2019 1500   Sepsis Labs: _0 (procalcitonin:4,lacticacidven:4)  ) Recent Results (from the past 240 hour(s))  SARS CORONAVIRUS 2 (TAT 6-24 HRS) Nasopharyngeal Nasopharyngeal Swab     Status: None   Collection Time: 11/04/19  5:05 PM   Specimen: Nasopharyngeal Swab  Result Value Ref Range Status   SARS Coronavirus 2 NEGATIVE NEGATIVE Final    Comment: (NOTE) SARS-CoV-2 target nucleic acids are NOT DETECTED. The SARS-CoV-2 RNA is generally detectable in upper and lower respiratory specimens during the acute phase of infection. Negative results do not preclude SARS-CoV-2 infection, do not rule out co-infections with other pathogens, and should not be used as the sole basis for treatment or other patient management decisions. Negative results must be combined with clinical observations, patient history, and epidemiological information. The expected result is Negative. Fact Sheet for Patients: SugarRoll.be  Fact Sheet for Healthcare Providers: https://www.woods-mathews.com/ This test is not yet approved or cleared by the [place] FDA and  has been authorized for detection and/or diagnosis of SARS-CoV-2 by FDA under an Emergency Use Authorization (EUA). This EUA will remain  in effect (meaning this test can be used) for the duration of the COVID-19 declaration under Section 56 4(b)(1) of the Act, 21 U.S.C. section 360bbb-3(b)(1), unless the authorization is terminated or revoked sooner. Performed at [facility], [place] [address] [place], [place] [address]          Radiology Studies: DG Lumbar Spine 2-3 Views  Result Date: 11/04/2019 CLINICAL DATA:  Right lower extremity weakness EXAM: LUMBAR SPINE - 2-3 VIEW COMPARISON:  None. FINDINGS: Sagittal alignment is within normal limits. Vertebral body heights are grossly maintained. Moderate severe degenerative change  at L5-S1. Mild osteophytes at most levels throughout the lumbar spine. Dense aortic atherosclerosis. Ectatic distal abdominal aorta measuring up to 3.2 cm. IMPRESSION: 1. No acute osseous abnormality. 2. Degenerative changes most advanced at L5-S1 3. Dense aortic atherosclerosis. AP diameter of calcified distal abdominal aorta up to 3.2 cm. This may be more thoroughly evaluated with CT, which could be performed on a nonemergent basis unless otherwise clinically indicated. Electronically Signed   By: Donavan Foil M.D.   On: 11/04/2019 17:03   CT Head Wo Contrast  Result Date: 11/04/2019 CLINICAL DATA:  The patient's legs gave way today but the patient did not fall. Initial encounter. EXAM: CT HEAD WITHOUT CONTRAST TECHNIQUE: Contiguous axial images were obtained from the base of the skull through the vertex without intravenous contrast. COMPARISON:  Head CT scan 09/28/2019. FINDINGS: Brain: No evidence of acute infarction, hemorrhage, hydrocephalus, extra-axial collection or mass lesion/mass effect. Atrophy and chronic microvascular ischemic change noted. Vascular: Atherosclerosis. Skull: Intact. No focal lesion. Sinuses/Orbits: Negative. Other: None. IMPRESSION: 1. No acute abnormality. 2. Atrophy and chronic microvascular ischemic change. 3. Atherosclerosis. Electronically Signed   By: Inge Rise M.D.   On: 11/04/2019 14:26   MR BRAIN WO CONTRAST  Result Date: 11/05/2019 CLINICAL DATA:  TIA EXAM: MRI HEAD WITHOUT CONTRAST TECHNIQUE: Multiplanar, multiecho pulse sequences of the brain and surrounding structures were obtained without intravenous contrast. COMPARISON:  CT head 11/04/2019.  MRI head 01/04/2013 FINDINGS: Brain: Negative for acute infarct. Mild to moderate atrophy with mild chronic microvascular ischemic change in the white matter and pons. No cortical infarct. Chronic microhemorrhage right posterior temporal lobe. Otherwise no hemorrhage or mass. No shift of the midline structures.  Vascular: Normal arterial flow voids. Skull and upper cervical spine: No focal skeletal abnormality. Lipoma over the convexity is chronic and benign appearing. Sinuses/Orbits: Mild mucosal edema paranasal sinuses. Negative orbit Other: None IMPRESSION: No acute abnormality. Atrophy and mild chronic microvascular ischemia. Electronically Signed   By: Franchot Gallo M.D.   On: 11/05/2019 18:48   DG Chest Portable 1 View  Result Date: 11/04/2019 CLINICAL DATA:  Right lower extremity weakness and numbness last night for 30 minutes and again today at 10:30 this morning. EXAM: PORTABLE CHEST 1 VIEW COMPARISON:  Single-view of the chest 09/28/2019. PA and lateral chest 06/22/2019. FINDINGS: There is cardiomegaly and pulmonary edema. Small bilateral pleural effusions. No pneumothorax. Atherosclerosis. IMPRESSION: Congestive heart failure. Atherosclerosis. Electronically Signed   By: Inge Rise M.D.   On: 11/04/2019 12:53   VAS [place] ABI WITH/WO TBI  Result Date: 11/05/2019 LOWER EXTREMITY DOPPLER STUDY Indications: Claudication, and peripheral artery disease.  Limitations: Today's exam was limited due  to involuntary patient movement. Comparison Study: AAA ultrasound 03-15-19. Performing Technologist: Baldwin Crown RDMS  Examination Guidelines: A complete evaluation includes at minimum, Doppler waveform signals and systolic blood pressure reading at the level of bilateral brachial, anterior tibial, and posterior tibial arteries, when vessel segments are accessible. Bilateral testing is considered an integral part of a complete examination. Photoelectric Plethysmograph (PPG) waveforms and toe systolic pressure readings are included as required and additional duplex testing as needed. Limited examinations for reoccurring indications may be performed as noted.  ABI Findings: +---------+------------------+-----+---------+--------+ Right    Rt Pressure (mmHg)IndexWaveform Comment   +---------+------------------+-----+---------+--------+ Brachial 120                    triphasic         +---------+------------------+-----+---------+--------+ PTA                             absent            +---------+------------------+-----+---------+--------+ DP                              absent            +---------+------------------+-----+---------+--------+ Great Toe                       Absent            +---------+------------------+-----+---------+--------+ +---------+------------------+-----+---------+---------------+ Left     Lt Pressure (mmHg)IndexWaveform Comment         +---------+------------------+-----+---------+---------------+ Brachial 122                    triphasic                +---------+------------------+-----+---------+---------------+ PTA      43                0.35          possibly venous +---------+------------------+-----+---------+---------------+ DP                                       possibly venous +---------+------------------+-----+---------+---------------+ Great Toe                       Absent                   +---------+------------------+-----+---------+---------------+  Summary: Right: Unable to innsonate PTA or DP due to low velocity vs nonexistant waveforms. Left: Resting left ankle-brachial index indicates severe left lower extremity arterial disease. Due to irregularity of waveforms unable to distingusihed between arterial vs venous flow.  *See table(s) above for measurements and observations.  Electronically signed by Deitra Mayo MD on 11/05/2019 at 4:42:49 PM.   Final         Scheduled Meds: . atorvastatin  80 mg Oral Daily  . bisacodyl  20 mg Oral Once  . feeding supplement (ENSURE ENLIVE)  237 mL Oral BID BM  . hydrALAZINE  10 mg Oral TID  . isosorbide mononitrate  30 mg Oral Daily  . metoCLOPramide (REGLAN) injection  10 mg Intravenous Once   Followed by  . [START ON 11/07/2019]  metoCLOPramide (REGLAN) injection  10 mg Intravenous Once  . metoprolol tartrate  50 mg Oral BID  . mometasone-formoterol  2 puff Inhalation BID  . pantoprazole  40 mg Oral BID  . peg 3350 powder  0.5 kit Oral Once   And  . [START ON 11/07/2019] peg 3350 powder  0.5 kit Oral Once  . polyethylene glycol  17 g Oral Daily  . sodium chloride flush  3 mL Intravenous Q12H  . torsemide  60 mg Oral BID   Continuous Infusions: . sodium chloride       LOS: 2 days    Time spent: 35 minutes    Domenic Polite, MD [facility] 11/06/2019, 12:11 PM

## 2019-11-07 ENCOUNTER — Encounter (HOSPITAL_COMMUNITY)
Admission: EM | Disposition: A | Discharge status: Home / Self Care | Payer: Self-pay | Source: Home / Self Care | Attending: Internal Medicine

## 2019-11-07 ENCOUNTER — Inpatient Hospital Stay (HOSPITAL_COMMUNITY): Payer: Medicare HMO | Admitting: Certified Registered"

## 2019-11-07 ENCOUNTER — Encounter (HOSPITAL_COMMUNITY): Payer: Self-pay | Admitting: Internal Medicine

## 2019-11-07 DIAGNOSIS — D509 Iron deficiency anemia, unspecified: Secondary | ICD-10-CM

## 2019-11-07 DIAGNOSIS — K3189 Other diseases of stomach and duodenum: Secondary | ICD-10-CM

## 2019-11-07 DIAGNOSIS — K635 Polyp of colon: Secondary | ICD-10-CM

## 2019-11-07 DIAGNOSIS — D123 Benign neoplasm of transverse colon: Secondary | ICD-10-CM

## 2019-11-07 DIAGNOSIS — K222 Esophageal obstruction: Secondary | ICD-10-CM

## 2019-11-07 HISTORY — PX: COLONOSCOPY WITH PROPOFOL: SHX5780

## 2019-11-07 HISTORY — PX: HEMOSTASIS CLIP PLACEMENT: SHX6857

## 2019-11-07 HISTORY — PX: ESOPHAGOGASTRODUODENOSCOPY (EGD) WITH PROPOFOL: SHX5813

## 2019-11-07 HISTORY — PX: BIOPSY: SHX5522

## 2019-11-07 HISTORY — PX: POLYPECTOMY: SHX5525

## 2019-11-07 LAB — CBC
HCT: 24.8 % — ABNORMAL LOW (ref 39.0–52.0)
Hemoglobin: 8.6 g/dL — ABNORMAL LOW (ref 13.0–17.0)
MCH: 26.5 pg (ref 26.0–34.0)
MCHC: 34.7 g/dL (ref 30.0–36.0)
MCV: 76.3 fL — ABNORMAL LOW (ref 80.0–100.0)
Platelets: 234 10*3/uL (ref 150–400)
RBC: 3.25 MIL/uL — ABNORMAL LOW (ref 4.22–5.81)
RDW: 17.8 % — ABNORMAL HIGH (ref 11.5–15.5)
WBC: 9.5 10*3/uL (ref 4.0–10.5)
nRBC: 1.4 % — ABNORMAL HIGH (ref 0.0–0.2)

## 2019-11-07 LAB — BASIC METABOLIC PANEL
Anion gap: 12 (ref 5–15)
BUN: 44 mg/dL — ABNORMAL HIGH (ref 8–23)
CO2: 25 mmol/L (ref 22–32)
Calcium: 8.7 mg/dL — ABNORMAL LOW (ref 8.9–10.3)
Chloride: 99 mmol/L (ref 98–111)
Creatinine, Ser: 2.27 mg/dL — ABNORMAL HIGH (ref 0.61–1.24)
GFR calc Af Amer: 31 mL/min — ABNORMAL LOW (ref >60)
GFR calc non Af Amer: 27 mL/min — ABNORMAL LOW (ref >60)
Glucose, Bld: 113 mg/dL — ABNORMAL HIGH (ref 70–99)
Potassium: 4.1 mmol/L (ref 3.5–5.1)
Sodium: 136 mmol/L (ref 135–145)

## 2019-11-07 LAB — MRSA PCR SCREENING: MRSA by PCR: NEGATIVE

## 2019-11-07 SURGERY — COLONOSCOPY WITH PROPOFOL
Anesthesia: Monitor Anesthesia Care

## 2019-11-07 MED ORDER — SODIUM CHLORIDE 0.9 % IV SOLN: INTRAVENOUS | Status: DC | PRN

## 2019-11-07 MED ORDER — PROPOFOL 10 MG/ML IV BOLUS
INTRAVENOUS | Status: DC | PRN
  Administered 2019-11-07 (×3): 20 mg via INTRAVENOUS

## 2019-11-07 MED ORDER — GLYCOPYRROLATE PF 0.2 MG/ML IJ SOSY
PREFILLED_SYRINGE | INTRAMUSCULAR | Status: DC | PRN
  Administered 2019-11-07: .1 mg via INTRAVENOUS

## 2019-11-07 MED ORDER — PHENYLEPHRINE HCL-NACL 10-0.9 MG/250ML-% IV SOLN
INTRAVENOUS | Status: DC | PRN
  Administered 2019-11-07: 50 ug/min via INTRAVENOUS

## 2019-11-07 MED ORDER — SODIUM CHLORIDE 0.9 % IV SOLN
INTRAVENOUS | Status: AC | PRN
  Administered 2019-11-07: 1000 mL via INTRAMUSCULAR

## 2019-11-07 MED ORDER — FERROUS SULFATE 325 (65 FE) MG PO TABS
325.0000 mg | ORAL_TABLET | Freq: Two times a day (BID) | ORAL | Status: DC
  Administered 2019-11-08: 07:00:00 325 mg via ORAL
  Filled 2019-11-07: qty 1

## 2019-11-07 MED ORDER — PHENYLEPHRINE 40 MCG/ML (10ML) SYRINGE FOR IV PUSH (FOR BLOOD PRESSURE SUPPORT)
PREFILLED_SYRINGE | INTRAVENOUS | Status: DC | PRN
  Administered 2019-11-07 (×3): 80 ug via INTRAVENOUS

## 2019-11-07 MED ORDER — ETOMIDATE 2 MG/ML IV SOLN
INTRAVENOUS | Status: DC | PRN
  Administered 2019-11-07: 6 mg via INTRAVENOUS
  Administered 2019-11-07: 4 mg via INTRAVENOUS

## 2019-11-07 MED ORDER — PROPOFOL 500 MG/50ML IV EMUL
INTRAVENOUS | Status: DC | PRN
  Administered 2019-11-07: 40 ug/kg/min via INTRAVENOUS

## 2019-11-07 MED ORDER — LIDOCAINE 2% (20 MG/ML) 5 ML SYRINGE
INTRAMUSCULAR | Status: DC | PRN
  Administered 2019-11-07: 50 mg via INTRAVENOUS

## 2019-11-07 SURGICAL SUPPLY — 25 items

## 2019-11-07 NOTE — Progress Notes (Signed)
Progress Note  Patient Name: Logan White Date of Encounter: 11/07/2019  Primary Cardiologist: Kirk Ruths, MD   Subjective   Pt denies dyspnea or chest pain  Inpatient Medications    Scheduled Meds: . atorvastatin  80 mg Oral Daily  . feeding supplement (ENSURE ENLIVE)  237 mL Oral BID BM  . hydrALAZINE  10 mg Oral TID  . isosorbide mononitrate  30 mg Oral Daily  . metoprolol tartrate  50 mg Oral BID  . mometasone-formoterol  2 puff Inhalation BID  . pantoprazole  40 mg Oral BID  . polyethylene glycol  17 g Oral Daily  . sodium chloride flush  3 mL Intravenous Q12H  . torsemide  60 mg Oral BID   Continuous Infusions: . sodium chloride     PRN Meds: sodium chloride, acetaminophen, albuterol, guaiFENesin-dextromethorphan, ondansetron (ZOFRAN) IV, oxyCODONE, sodium chloride flush   Vital Signs    Vitals:   11/07/19 0045 11/07/19 0540 11/07/19 0749 11/07/19 0800  BP: 103/69 104/61  106/68  Pulse: (!) 101 64 71 75  Resp: 20 19 18 18   Temp: 97.7 F (36.5 C) 97.9 F (36.6 C)  98.3 F (36.8 C)  TempSrc: Oral Oral  Oral  SpO2: 96% 99% 96%   Weight:  83.1 kg    Height:        Intake/Output Summary (Last 24 hours) at 11/07/2019 0859 Last data filed at 11/07/2019 0542 Gross per 24 hour  Intake 702 ml  Output 851 ml  Net -149 ml   Last 3 Weights 11/07/2019 11/05/2019 11/04/2019  Weight (lbs) 183 lb 4.8 oz 183 lb 3.2 oz 180 lb 1.9 oz  Weight (kg) 83.144 kg 83.099 kg 81.7 kg  Some encounter information is confidential and restricted. Go to Review Flowsheets activity to see all data.      Telemetry   Atrial fibrillation with 7 beats NSVT; personally reviewed.  Physical Exam   GEN: NAD Neck: supple Cardiac: irregular, 2/6 systolic murmur Respiratory: CTA; no wheeze GI: Soft, NT/ND, no masses MS: 1+ edema Neuro:  No focal findings Psych: Normal affect   Labs   Chemistry Recent Labs  Lab 11/04/19 1241 11/04/19 1241 11/05/19 0530 11/06/19 0023  11/07/19 0318  NA 133*   < > 133* 135 136  K 5.2*   < > 4.7 4.3 4.1  CL 99   < > 101 99 99  CO2 25   < > 23 25 25   GLUCOSE 115*   < > 117* 97 113*  BUN 46*   < > 54* 47* 44*  CREATININE 2.41*   < > 2.66* 2.30* 2.27*  CALCIUM 8.3*   < > 8.2* 8.4* 8.7*  PROT 5.6*  --   --   --   --   ALBUMIN 2.9*  --   --   --   --   AST 29  --   --   --   --   ALT 20  --   --   --   --   ALKPHOS 124  --   --   --   --   BILITOT 1.4*  --   --   --   --   GFRNONAA 25*   < > 22* 27* 27*  GFRAA 29*   < > 26* 31* 31*  ANIONGAP 9   < > 9 11 12    < > = values in this interval not displayed.     Hematology Recent Labs  Lab 11/05/19 0910 11/06/19 0023 11/07/19 0318  WBC 5.6 9.1 9.5  RBC 2.77* 3.51* 3.25*  HGB 6.9* 9.3* 8.6*  HCT 20.6* 26.8* 24.8*  MCV 74.4* 76.4* 76.3*  MCH 24.9* 26.5 26.5  MCHC 33.5 34.7 34.7  RDW 17.0* 17.6* 17.8*  PLT 252 264 234    BNP Recent Labs  Lab 11/04/19 1241  BNP 1,655.3*     Radiology    MR BRAIN WO CONTRAST  Result Date: 11/05/2019 CLINICAL DATA:  TIA EXAM: MRI HEAD WITHOUT CONTRAST TECHNIQUE: Multiplanar, multiecho pulse sequences of the brain and surrounding structures were obtained without intravenous contrast. COMPARISON:  CT head 11/04/2019.  MRI head 01/04/2013 FINDINGS: Brain: Negative for acute infarct. Mild to moderate atrophy with mild chronic microvascular ischemic change in the white matter and pons. No cortical infarct. Chronic microhemorrhage right posterior temporal lobe. Otherwise no hemorrhage or mass. No shift of the midline structures. Vascular: Normal arterial flow voids. Skull and upper cervical spine: No focal skeletal abnormality. Lipoma over the convexity is chronic and benign appearing. Sinuses/Orbits: Mild mucosal edema paranasal sinuses. Negative orbit Other: None IMPRESSION: No acute abnormality. Atrophy and mild chronic microvascular ischemia. Electronically Signed   By: Franchot Gallo M.D.   On: 11/05/2019 18:48   VAS [place] ABI WITH/WO  TBI  Result Date: 11/05/2019 LOWER EXTREMITY DOPPLER STUDY Indications: Claudication, and peripheral artery disease.  Limitations: Today's exam was limited due to involuntary patient movement. Comparison Study: [facility] ultrasound 03-15-19. Performing Technologist: Baldwin Crown RDMS  Examination Guidelines: A complete evaluation includes at minimum, Doppler waveform signals and systolic blood pressure reading at the level of bilateral brachial, anterior tibial, and posterior tibial arteries, when vessel segments are accessible. Bilateral testing is considered an integral part of a complete examination. Photoelectric Plethysmograph (PPG) waveforms and toe systolic pressure readings are included as required and additional duplex testing as needed. Limited examinations for reoccurring indications may be performed as noted.  ABI Findings: +---------+------------------+-----+---------+--------+ Right    Rt Pressure (mmHg)IndexWaveform Comment  +---------+------------------+-----+---------+--------+ Brachial 120                    triphasic         +---------+------------------+-----+---------+--------+ PTA                             absent            +---------+------------------+-----+---------+--------+ DP                              absent            +---------+------------------+-----+---------+--------+ Great Toe                       Absent            +---------+------------------+-----+---------+--------+ +---------+------------------+-----+---------+---------------+ Left     Lt Pressure (mmHg)IndexWaveform Comment         +---------+------------------+-----+---------+---------------+ Brachial 122                    triphasic                +---------+------------------+-----+---------+---------------+ PTA      43                0.35          possibly venous +---------+------------------+-----+---------+---------------+ DP  possibly venous +---------+------------------+-----+---------+---------------+ Great Toe                       Absent                   +---------+------------------+-----+---------+---------------+  Summary: Right: Unable to innsonate PTA or DP due to low velocity vs nonexistant waveforms. Left: Resting left ankle-brachial index indicates severe left lower extremity arterial disease. Due to irregularity of waveforms unable to distingusihed between arterial vs venous flow.  *See table(s) above for measurements and observations.  Electronically signed by Deitra Mayo MD on 11/05/2019 at 4:42:49 PM.   Final     Patient Profile     79 y.o. male with past medical history of coronary disease, ischemic cardiomyopathy, mitral regurgitation, previous ventricular fibrillation arrest, chronic systolic congestive heart failure, chronic stage III kidney disease, peripheral vascular disease admitted with acute on chronic systolic congestive heart failure and intermittent right lower extremity numbness, weakness and pain. Echocardiogram January 2021 showed ejection fraction 25 to 30%, mild left ventricular hypertrophy, severe left ventricular enlargement, severe RV dysfunction, biatrial enlargement, moderate mitral regurgitation and mild aortic insufficiency. Right heart cath January 2021 showed pulmonary capillary wedge pressure 28, PA pressure 63/29 and RA pressure of 20.   Assessment & Plan    1 acute on chronic systolic congestive heart failure-continue present dose of Demadex.  Follow renal function.  2 right lower extremity pain, transient numbness, transient weakness-no recurrent episodes.  Etiology remains unclear to me.  MRI negative.  It is possible that patient became anemic from his GI bleed which exacerbated his vascular insufficiency to the right lower extremity.  However presentation would be unusual as he states he had sudden loss of use of his right lower extremity.  His symptoms have  improved.  Vascular surgery has seen and medical therapy has been recommended.    3 ischemic cardiomyopathy-continue beta-blocker, hydralazine and nitrates.  No ARB or Entresto given renal insufficiency.  4 mitral regurgitation-moderate on recent transesophageal echocardiogram.  5 chronic stage III kidney disease-renal function unchanged this morning.  We will continue to follow.  6 permanent atrial fibrillation-continue beta-blocker for rate control. Apixaban held due to GI bleeding.  7 anemia-GI following and plans EGD and colonoscopy today.  Apixaban and aspirin on hold.  Hemoglobin trending down this morning.  Transfuse as needed per hospitalist service. Continue Protonix.  For questions or updates, please contact [place] Please consult [email or web address] for contact info under        Signed, Kirk Ruths, MD  11/07/2019, 8:59 AM

## 2019-11-07 NOTE — Anesthesia Preprocedure Evaluation (Addendum)
Anesthesia Evaluation  Patient identified by MRN, date of birth, ID band Patient awake    Reviewed: Allergy & Precautions, NPO status , Patient's Chart, lab work & pertinent test results  Airway Mallampati: II  TM Distance: >3 FB Neck ROM: Full    Dental no notable dental hx.    Pulmonary shortness of breath and with exertion, former smoker,    Pulmonary exam normal breath sounds clear to auscultation       Cardiovascular hypertension, + CAD, + Cardiac Stents, + Peripheral Vascular Disease and +CHF  + dysrhythmias Atrial Fibrillation + Valvular Problems/Murmurs MR  Rhythm:Regular Rate:Normal + Systolic murmurs EF 76-19%   Neuro/Psych negative neurological ROS  negative psych ROS   GI/Hepatic negative GI ROS, Neg liver ROS,   Endo/Other  negative endocrine ROSdiabetes  Renal/GU Renal InsufficiencyRenal disease  negative genitourinary   Musculoskeletal negative musculoskeletal ROS (+)   Abdominal   Peds negative pediatric ROS (+)  Hematology  (+) anemia ,   Anesthesia Other Findings   Reproductive/Obstetrics negative OB ROS                           Anesthesia Physical Anesthesia Plan  ASA: IV  Anesthesia Plan: MAC   Post-op Pain Management:    Induction: Intravenous  PONV Risk Score and Plan: 0  Airway Management Planned: Simple Face Mask  Additional Equipment:   Intra-op Plan:   Post-operative Plan:   Informed Consent: I have reviewed the patients History and Physical, chart, labs and discussed the procedure including the risks, benefits and alternatives for the proposed anesthesia with the patient or authorized representative who has indicated his/her understanding and acceptance.     Dental advisory given  Plan Discussed with: CRNA and Surgeon  Anesthesia Plan Comments:        Anesthesia Quick Evaluation

## 2019-11-07 NOTE — Anesthesia Postprocedure Evaluation (Signed)
Anesthesia Post Note  Patient: Logan White  Procedure(s) Performed: COLONOSCOPY WITH PROPOFOL (N/A ) ESOPHAGOGASTRODUODENOSCOPY (EGD) WITH PROPOFOL (N/A ) BIOPSY HEMOSTASIS CLIP PLACEMENT     Patient location during evaluation: PACU Anesthesia Type: MAC Level of consciousness: awake and alert Pain management: pain level controlled Vital Signs Assessment: post-procedure vital signs reviewed and stable Respiratory status: spontaneous breathing, nonlabored ventilation, respiratory function stable and patient connected to nasal cannula oxygen Cardiovascular status: stable and blood pressure returned to baseline Postop Assessment: no apparent nausea or vomiting Anesthetic complications: no    Last Vitals:  Vitals:   11/07/19 1515 11/07/19 1525  BP: (!) 95/55 124/85  Pulse: 77 81  Resp: 18 (!) 24  Temp: (!) 36.4 C   SpO2: 99% 97%    Last Pain:  Vitals:   11/07/19 1515  TempSrc: Temporal  PainSc: 0-No pain                 Matei Magnone S

## 2019-11-07 NOTE — Progress Notes (Addendum)
Patient was able to drink 1/2 of first prep within two hours and has completed 3/4 of the prep at this time 0440.  Patient has been continually encouraged to drink prep but is having a difficult time.  Current BM are still dark but liquid.

## 2019-11-07 NOTE — Progress Notes (Signed)
   VASCULAR SURGERY ASSESSMENT & PLAN:   CHRONIC MULTILEVEL ARTERIAL OCCLUSIVE DISEASE: No complaints of leg pain.  His peripheral vascular disease is followed by Dr. Quay Burow.  Vascular surgery will be available as needed.  SUBJECTIVE:   No complaints of leg pain.  PHYSICAL EXAM:   Vitals:   11/06/19 2243 11/07/19 0045 11/07/19 0540 11/07/19 0749  BP: 109/74 103/69 104/61   Pulse:  (!) 101 64 71  Resp:  20 19 18   Temp:  97.7 F (36.5 C) 97.9 F (36.6 C)   TempSrc:  Oral Oral   SpO2:  96% 99% 96%  Weight:   83.1 kg   Height:       Both feet are warm.  LABS:   Lab Results  Component Value Date   WBC 9.5 11/07/2019   HGB 8.6 (L) 11/07/2019   HCT 24.8 (L) 11/07/2019   MCV 76.3 (L) 11/07/2019   PLT 234 11/07/2019   Lab Results  Component Value Date   CREATININE 2.27 (H) 11/07/2019   Lab Results  Component Value Date   INR 2.5 (H) 09/30/2019    PROBLEM LIST:    Active Problems:   Weakness   CHF (congestive heart failure) (HCC)   Acute blood loss anemia   Melena   CURRENT MEDS:   . atorvastatin  80 mg Oral Daily  . feeding supplement (ENSURE ENLIVE)  237 mL Oral BID BM  . hydrALAZINE  10 mg Oral TID  . isosorbide mononitrate  30 mg Oral Daily  . metoprolol tartrate  50 mg Oral BID  . mometasone-formoterol  2 puff Inhalation BID  . pantoprazole  40 mg Oral BID  . polyethylene glycol  17 g Oral Daily  . sodium chloride flush  3 mL Intravenous Q12H  . torsemide  60 mg Oral BID    Deitra Mayo Office: [phone] 11/07/2019

## 2019-11-07 NOTE — Care Management Important Message (Signed)
Important Message  Patient Details  Name: Logan White MRN: [id number] Date of Birth: 1941/07/22   Medicare Important Message Given:  Yes     Shelda Altes 11/07/2019, 9:51 AM

## 2019-11-07 NOTE — Progress Notes (Signed)
PROGRESS NOTE    MAGIC MOHLER  BJS:[id number] DOB: May 15, 1941 DOA: 11/04/2019 PCP: Patient, No Pcp Per  Brief Narrative:  Mr. Coia is a 79 y.o. M with sCHF EF 25-30%, CAD with isch CM, hx VF arrest no ICD, chronic hypoxic respiratory failure on 2 L, HTN, hx CVA, AAA, CKD stage IV baseline Cr 1.8-2.5, hx cardiac arrest, PVD carotid, persistent AF on Eliquis, and PVD carotids and bilateral LE who presented with pain in the right leg. -In the emergency room he was found to have severe anemia with hemoglobin in the 6 range with heme positive stools and report of melena  Assessment & Plan:  Right leg weakness and pain Patient presented with acute right leg weakness, pain, poor historian -MRI negative for CVA, appreciate vascular input, suspected to have transient worsening of his PAD symptoms in the setting of anemia, TIA is also possibility -No symptoms at this time  -Physical therapy evaluation completed, home health PT recommended, he is ambulating without any discomfort -last LE angiography in 2017 he had "occluded R external iliac and occluded L SFA" both not percutaneously addressable.  Acute on chronic CHF/ischemic cardiomyopathy, EF 25% Pulmonary HTN, group 2 Severe right heart failure Coronary artery disease, PAD History of VF arrest Echo 1 month ago showed EF 25 to 30%. Severe RHF, pHTN.  RHC showed PCWP 28 -Improving with diuresis, close to euvolemic, cardiology following -Metoprolol, Imdur, hydralazine -Continue oral torsemide  Severe anemia Suspected GI blood loss anemia -Presented with melena, hemoglobin of 6.9/7, improved to 9 posttransfusion, now down slightly to 8.6 -Eliquis held, continue IV PPI, GI following  -Continue clear liquids, plan for EGD to today -Monitor hemoglobin  Chronic hypoxic respiratory failure COPD -Continue home O2 -Continue ICS-LABA  CKD stage IV Cr slightly up relative to baseline.  Baseline GFR in last few months is 25-35.  -Creatinine stable, monitor with diuresis, avoid hypotension  Persistent atrial fibrillation -Continue metoprolol -Eliquis on hold pending GI work-up  Moderate protein calorie malnutrition As evidenced by weight loss, diminished muscle mass and fat, BMI 22, and chronic organ failure. -Continue Ensure  Hyponatremia Mild -Repeat BMP  DVT prophylaxis: SCDs Code Status: FULL Family Communication: No family at bedside, attempted to call spouse without success Disposition: Home pending GI work-up for melena, anemia and improvement in volume status with diuresis    Consultants:   Cardiology  Vascular surgery  Vascular Cardiology  Gastroenterology  Procedures:   3/1 CT head -- NAICP  3/1 x-ray lumbar spine -- nonspecific degenerative changes  3/2 ABI and duplex US bilateral LE -- pending  3/2 MRI brain negative for acute infarct   Subjective: -Feels okay, denies any shortness of breath or right leg symptoms at this time  Objective: Vitals:   11/07/19 0045 11/07/19 0540 11/07/19 0749 11/07/19 0800  BP: 103/69 104/61  106/68  Pulse: (!) 101 64 71 75  Resp: 20 19 18 18   Temp: 97.7 F (36.5 C) 97.9 F (36.6 C)  98.3 F (36.8 C)  TempSrc: Oral Oral  Oral  SpO2: 96% 99% 96%   Weight:  83.1 kg    Height:        Intake/Output Summary (Last 24 hours) at 11/07/2019 1149 Last data filed at 11/07/2019 0542 Gross per 24 hour  Intake 702 ml  Output 851 ml  Net -149 ml   Filed Weights   11/04/19 1740 11/05/19 0014 11/07/19 0540  Weight: 81.7 kg 83.1 kg 83.1 kg    Examination: Gen: Chronically ill  elderly male sitting up in the recliner, AAO x3, no distress HEENT: PERRLA, Neck supple, no JVD Lungs: Clear CVS: RRR,No Gallops,Rubs or new Murmurs Abd: soft, Non tender, non distended, BS present Extremities: No edema Skin: no new rashes Neuro: Awake and alert, cranial nerves II to XII intact, motor 4+ by 5 in all extremities, sensations intact.  Data Reviewed: I  have personally reviewed following labs and imaging studies:  CBC: Recent Labs  Lab 11/04/19 1944 11/05/19 0910 11/06/19 0023 11/07/19 0318  WBC 5.3 5.6 9.1 9.5  NEUTROABS 3.1  --   --   --   HGB 7.0* 6.9* 9.3* 8.6*  HCT 21.0* 20.6* 26.8* 24.8*  MCV 75.3* 74.4* 76.4* 76.3*  PLT 257 252 264 956   Basic Metabolic Panel: Recent Labs  Lab 11/04/19 1241 11/05/19 0530 11/05/19 1225 11/06/19 0023 11/07/19 0318  NA 133* 133*  --  135 136  K 5.2* 4.7  --  4.3 4.1  CL 99 101  --  99 99  CO2 25 23  --  25 25  GLUCOSE 115* 117*  --  97 113*  BUN 46* 54*  --  47* 44*  CREATININE 2.41* 2.66*  --  2.30* 2.27*  CALCIUM 8.3* 8.2*  --  8.4* 8.7*  MG  --   --  2.9*  --   --    GFR: Estimated Creatinine Clearance: 32.5 mL/min (A) (by C-G formula based on SCr of 2.27 mg/dL (H)). Liver Function Tests: Recent Labs  Lab 11/04/19 1241  AST 29  ALT 20  ALKPHOS 124  BILITOT 1.4*  PROT 5.6*  ALBUMIN 2.9*   No results for input(s): LIPASE, AMYLASE in the last 168 hours. No results for input(s): AMMONIA in the last 168 hours. Coagulation Profile: No results for input(s): INR, PROTIME in the last 168 hours. Cardiac Enzymes: No results for input(s): CKTOTAL, CKMB, CKMBINDEX, TROPONINI in the last 168 hours. BNP (last 3 results) Recent Labs    08/15/19 0949  PROBNP 16,511*   HbA1C: No results for input(s): HGBA1C in the last 72 hours. CBG: No results for input(s): GLUCAP in the last 168 hours. Lipid Profile: No results for input(s): CHOL, HDL, LDLCALC, TRIG, CHOLHDL, LDLDIRECT in the last 72 hours. Thyroid Function Tests: No results for input(s): TSH, T4TOTAL, FREET4, T3FREE, THYROIDAB in the last 72 hours. Anemia Panel: No results for input(s): VITAMINB12, FOLATE, FERRITIN, TIBC, IRON, RETICCTPCT in the last 72 hours. Urine analysis:    Component Value Date/Time   COLORURINE YELLOW 11/04/2019 1500   APPEARANCEUR CLEAR 11/04/2019 1500   LABSPEC 1.011 11/04/2019 1500    PHURINE 6.0 11/04/2019 1500   GLUCOSEU NEGATIVE 11/04/2019 1500   HGBUR NEGATIVE 11/04/2019 1500   BILIRUBINUR NEGATIVE 11/04/2019 1500   KETONESUR NEGATIVE 11/04/2019 1500   PROTEINUR NEGATIVE 11/04/2019 1500   UROBILINOGEN 0.2 03/23/2013 1740   NITRITE NEGATIVE 11/04/2019 1500   LEUKOCYTESUR NEGATIVE 11/04/2019 1500   Sepsis Labs: @LABRCNTIP (procalcitonin:4,lacticacidven:4)  ) Recent Results (from the past 240 hour(s))  SARS CORONAVIRUS 2 (TAT 6-24 HRS) Nasopharyngeal Nasopharyngeal Swab     Status: None   Collection Time: 11/04/19  5:05 PM   Specimen: Nasopharyngeal Swab  Result Value Ref Range Status   SARS Coronavirus 2 NEGATIVE NEGATIVE Final    Comment: (NOTE) SARS-CoV-2 target nucleic acids are NOT DETECTED. The SARS-CoV-2 RNA is generally detectable in upper and lower respiratory specimens during the acute phase of infection. Negative results do not preclude SARS-CoV-2 infection, do not rule out co-infections  with other pathogens, and should not be used as the sole basis for treatment or other patient management decisions. Negative results must be combined with clinical observations, patient history, and epidemiological information. The expected result is Negative. Fact Sheet for Patients: SugarRoll.be Fact Sheet for Healthcare Providers: https://www.woods-mathews.com/ This test is not yet approved or cleared by the [place] FDA and  has been authorized for detection and/or diagnosis of SARS-CoV-2 by FDA under an Emergency Use Authorization (EUA). This EUA will remain  in effect (meaning this test can be used) for the duration of the COVID-19 declaration under Section 56 4(b)(1) of the Act, 21 U.S.C. section 360bbb-3(b)(1), unless the authorization is terminated or revoked sooner. Performed at [facility], [place] [address] [place], [place] [address]          Radiology Studies: MR BRAIN WO CONTRAST  Result  Date: 11/05/2019 CLINICAL DATA:  TIA EXAM: MRI HEAD WITHOUT CONTRAST TECHNIQUE: Multiplanar, multiecho pulse sequences of the brain and surrounding structures were obtained without intravenous contrast. COMPARISON:  CT head 11/04/2019.  MRI head 01/04/2013 FINDINGS: Brain: Negative for acute infarct. Mild to moderate atrophy with mild chronic microvascular ischemic change in the white matter and pons. No cortical infarct. Chronic microhemorrhage right posterior temporal lobe. Otherwise no hemorrhage or mass. No shift of the midline structures. Vascular: Normal arterial flow voids. Skull and upper cervical spine: No focal skeletal abnormality. Lipoma over the convexity is chronic and benign appearing. Sinuses/Orbits: Mild mucosal edema paranasal sinuses. Negative orbit Other: None IMPRESSION: No acute abnormality. Atrophy and mild chronic microvascular ischemia. Electronically Signed   By: Franchot Gallo M.D.   On: 11/05/2019 18:48   VAS [place] ABI WITH/WO TBI  Result Date: 11/05/2019 LOWER EXTREMITY DOPPLER STUDY Indications: Claudication, and peripheral artery disease.  Limitations: Today's exam was limited due to involuntary patient movement. Comparison Study: AAA ultrasound 03-15-19. Performing Technologist: Baldwin Crown RDMS  Examination Guidelines: A complete evaluation includes at minimum, Doppler waveform signals and systolic blood pressure reading at the level of bilateral brachial, anterior tibial, and posterior tibial arteries, when vessel segments are accessible. Bilateral testing is considered an integral part of a complete examination. Photoelectric Plethysmograph (PPG) waveforms and toe systolic pressure readings are included as required and additional duplex testing as needed. Limited examinations for reoccurring indications may be performed as noted.  ABI Findings: +---------+------------------+-----+---------+--------+ Right    Rt Pressure (mmHg)IndexWaveform Comment   +---------+------------------+-----+---------+--------+ Brachial 120                    triphasic         +---------+------------------+-----+---------+--------+ PTA                             absent            +---------+------------------+-----+---------+--------+ DP                              absent            +---------+------------------+-----+---------+--------+ Great Toe                       Absent            +---------+------------------+-----+---------+--------+ +---------+------------------+-----+---------+---------------+ Left     Lt Pressure (mmHg)IndexWaveform Comment         +---------+------------------+-----+---------+---------------+ Brachial 122  triphasic                +---------+------------------+-----+---------+---------------+ PTA      43                0.35          possibly venous +---------+------------------+-----+---------+---------------+ DP                                       possibly venous +---------+------------------+-----+---------+---------------+ Great Toe                       Absent                   +---------+------------------+-----+---------+---------------+  Summary: Right: Unable to innsonate PTA or DP due to low velocity vs nonexistant waveforms. Left: Resting left ankle-brachial index indicates severe left lower extremity arterial disease. Due to irregularity of waveforms unable to distingusihed between arterial vs venous flow.  *See table(s) above for measurements and observations.  Electronically signed by Deitra Mayo MD on 11/05/2019 at 4:42:49 PM.   Final         Scheduled Meds: . atorvastatin  80 mg Oral Daily  . feeding supplement (ENSURE ENLIVE)  237 mL Oral BID BM  . hydrALAZINE  10 mg Oral TID  . isosorbide mononitrate  30 mg Oral Daily  . metoprolol tartrate  50 mg Oral BID  . mometasone-formoterol  2 puff Inhalation BID  . pantoprazole  40 mg Oral BID  .  polyethylene glycol  17 g Oral Daily  . sodium chloride flush  3 mL Intravenous Q12H  . torsemide  60 mg Oral BID   Continuous Infusions: . sodium chloride       LOS: 3 days    Time spent: 35 minutes    Domenic Polite, MD [facility] 11/07/2019, 11:49 AM

## 2019-11-07 NOTE — Anesthesia Procedure Notes (Signed)
Procedure Name: MAC Date/Time: 11/07/2019 2:46 PM Performed by: Orlie Dakin, CRNA Pre-anesthesia Checklist: Patient identified, Emergency Drugs available, Suction available and Patient being monitored Patient Re-evaluated:Patient Re-evaluated prior to induction Oxygen Delivery Method: Simple face mask Preoxygenation: Pre-oxygenation with 100% oxygen Induction Type: IV induction Placement Confirmation: positive ETCO2

## 2019-11-07 NOTE — Op Note (Addendum)
[facility] Patient Name: Logan White Procedure Date : 11/07/2019 MRN: [id number] Attending MD: [name] , MD Date of Birth: 1941/05/22 CSN: [id number] Age: 79 Admit Type: Inpatient Procedure:                Upper GI endoscopy Indications:              Iron deficiency, microcytic anemia, Melena Providers:                Pricilla Riffle. Fuller Plan, MD, Grace Isaac, RN, William Dalton, Technician Referring MD:             [facility] Medicines:                Monitored Anesthesia Care Complications:            No immediate complications. Estimated Blood Loss:     Estimated blood loss was minimal. Procedure:                Pre-Anesthesia Assessment:                           - Prior to the procedure, a History and Physical                            was performed, and patient medications and                            allergies were reviewed. The patient's tolerance of                            previous anesthesia was also reviewed. The risks                            and benefits of the procedure and the sedation                            options and risks were discussed with the patient.                            All questions were answered, and informed consent                            was obtained. Prior Anticoagulants: The patient has                            taken Eliquis (apixaban), last dose was 2 days                            prior to procedure. ASA Grade Assessment: III - A                            patient with severe systemic disease. After  reviewing the risks and benefits, the patient was                            deemed in satisfactory condition to undergo the                            procedure.                           After obtaining informed consent, the endoscope was                            passed under direct vision. Throughout the                            procedure, the patient's blood  pressure, pulse, and                            oxygen saturations were monitored continuously. The                            GIF-H190 ([id number]) Olympus gastroscope was                            introduced through the mouth, and advanced to the                            second part of duodenum. The upper GI endoscopy was                            accomplished without difficulty. The patient                            tolerated the procedure well. Scope In: Scope Out: Findings:      One benign-appearing, intrinsic mild stenosis was found at the       gastroesophageal junction. This stenosis measured 1.5 cm (inner       diameter) x less than one cm (in length). The stenosis was traversed.      The exam of the esophagus was otherwise normal.      A small hiatal hernia was present.      Multiple localized small erosions with no bleeding and no stigmata of       recent bleeding were found in the gastric antrum. Biopsies were taken       with a cold forceps for histology.      Localized moderate inflammation characterized by erythema and       granularity was found in the gastric fundus and in the gastric body.       Biopsies were taken with a cold forceps for histology.      The exam of the stomach was otherwise normal.      The duodenal bulb and second portion of the duodenum were normal. Impression:               - Benign-appearing esophageal stenosis.                           -  Small hiatal hernia.                           - Erosive gastropathy with no bleeding and no                            stigmata of recent bleeding. Biopsied.                           - Gastritis. Biopsied.                           - Normal duodenal bulb and second portion of the                            duodenum. Recommendation:           - Return patient to hospital ward for ongoing care.                           - Await pathology results.                           - Resume Eliquis (apixaban) at prior  dose in 5                            days. Refer to managing physician for further                            adjustment of therapy.                           - Avoid ASA/NSAID use. EC ASA 81 mg is ok in 2                            weeks as indicated per Cardiology.                           - Continue present medications including                            pantoprazole 40 mg po bid for 2 weeks, then                            pantoprazole 40 mg po qd long term.                           - FeSO4 325 mg po bid for 3 months.                           - Gastritis is the likely source of acute bleed.                           - Outpatient follow up with GI and PCP. Procedure Code(s):        --- Professional ---  66440, Esophagogastroduodenoscopy, flexible,                            transoral; with biopsy, single or multiple Diagnosis Code(s):        --- Professional ---                           K22.2, Esophageal obstruction                           K44.9, Diaphragmatic hernia without obstruction or                            gangrene                           K31.89, Other diseases of stomach and duodenum                           K29.70, Gastritis, unspecified, without bleeding                           D50.9, Iron deficiency anemia, unspecified                           K92.1, Melena (includes Hematochezia) CPT copyright 2019 American Medical Association. All rights reserved. The codes documented in this report are preliminary and upon coder review may  be revised to meet current compliance requirements. [name], MD 11/07/2019 3:16:54 PM This report has been signed electronically. Number of Addenda: 0

## 2019-11-07 NOTE — Transfer of Care (Signed)
Immediate Anesthesia Transfer of Care Note  Patient: Logan White  Procedure(s) Performed: COLONOSCOPY WITH PROPOFOL (N/A ) ESOPHAGOGASTRODUODENOSCOPY (EGD) WITH PROPOFOL (N/A ) BIOPSY HEMOSTASIS CLIP PLACEMENT  Patient Location: Endoscopy Unit  Anesthesia Type:MAC  Level of Consciousness: drowsy  Airway & Oxygen Therapy: Patient Spontanous Breathing and Patient connected to nasal cannula oxygen  Post-op Assessment: Report given to RN and Post -op Vital signs reviewed and stable  Post vital signs: Reviewed and stable  Last Vitals:  Vitals Value Taken Time  BP 95/55 11/07/19 1514  Temp    Pulse 37 11/07/19 1517  Resp 20 11/07/19 1517  SpO2 98 % 11/07/19 1517  Vitals shown include unvalidated device data.  Last Pain:  Vitals:   11/07/19 1515  TempSrc:   PainSc: 0-No pain         Complications: No apparent anesthesia complications

## 2019-11-07 NOTE — Op Note (Addendum)
[facility] Patient Name: Logan White Procedure Date : 11/07/2019 MRN: [id number] Attending MD: [name] , MD Date of Birth: 1941/02/04 CSN: [id number] Age: 79 Admit Type: Inpatient Procedure:                Colonoscopy Indications:              Melena, Unexplained microcytic iron deficiency                            anemia Providers:                Pricilla Riffle. Fuller Plan, MD, Grace Isaac, RN, Lazaro Arms, Technician Referring MD:             [facility] Medicines:                Monitored Anesthesia Care Complications:            No immediate complications. Estimated blood loss:                            None. Estimated Blood Loss:     Estimated blood loss: none. Procedure:                Pre-Anesthesia Assessment:                           - Prior to the procedure, a History and Physical                            was performed, and patient medications and                            allergies were reviewed. The patient's tolerance of                            previous anesthesia was also reviewed. The risks                            and benefits of the procedure and the sedation                            options and risks were discussed with the patient.                            All questions were answered, and informed consent                            was obtained. Prior Anticoagulants: The patient has                            taken Eliquis (apixaban), last dose was 2 days                            prior to procedure. ASA Grade Assessment:  III - A                            patient with severe systemic disease. After                            reviewing the risks and benefits, the patient was                            deemed in satisfactory condition to undergo the                            procedure.                           After obtaining informed consent, the colonoscope                            was passed under direct  vision. Throughout the                            procedure, the patient's blood pressure, pulse, and                            oxygen saturations were monitored continuously. The                            CF-HQ190L ([id number]) Olympus colonoscope was                            introduced through the anus and advanced to the the                            cecum, identified by appendiceal orifice and                            ileocecal valve. The ileocecal valve, appendiceal                            orifice, and rectum were photographed. The quality                            of the bowel preparation was good. The colonoscopy                            was performed without difficulty. The patient                            tolerated the procedure well. Scope In: 2:24:37 PM Scope Out: 2:51:31 PM Scope Withdrawal Time: 0 hours 17 minutes 8 seconds  Total Procedure Duration: 0 hours 26 minutes 54 seconds  Findings:      The perianal and digital rectal examinations were normal.      Scattered medium-mouthed diverticula were found in the right colon.       There was no evidence  of diverticular bleeding.      Multiple medium-mouthed diverticula were found in the left colon. There       was evidence of diverticular spasm. There was no evidence of       diverticular bleeding.      Three sessile polyps were found in the transverse colon. The polyps were       7 to 8 mm in size. These polyps were removed with a cold snare.       Resection and retrieval were complete. Mild, persistent oozing at all 3       sites. To prevent bleeding after the polypectomy, three hemostatic clips       were successfully placed (MR conditional). There was no bleeding at the       end of the procedure.      Internal hemorrhoids were found during retroflexion. The hemorrhoids       were moderate and Grade I (internal hemorrhoids that do not prolapse).      The exam was otherwise without abnormality on direct and  retroflexion       views. Impression:               - Mild diverticulosis in the right colon.                           - Moderate diverticulosis in the left colon.                           - Three 7 to 8 mm polyps in the transverse colon,                            removed with a cold snare. Resected and retrieved.                            Clips (MR conditional) were placed, 1 at each                            polypectomy site.                           - Internal hemorrhoids.                           - The examination was otherwise normal on direct                            and retroflexion views. Recommendation:           - Resume Eliquis (apixaban) in 5 days at prior                            dose. Refer to managing physician for further                            adjustment of therapy.                           - Resume previous diet.                           -  Continue present medications.                           - FeSO4 325 mg po bid for 3 months,                           - Await pathology results.                           - No repeat colonoscopy due to age.                           - No aspirin, ibuprofen, naproxen, or other                            non-steroidal anti-inflammatory drugs. EC ASA 81 mg                            is ok in 2 weeks as indicated per Cardiology.                           - Return patient to hospital ward for ongoing care. Procedure Code(s):        --- Professional ---                           [phone], Colonoscopy, flexible; with removal of                            tumor(s), polyp(s), or other lesion(s) by snare                            technique Diagnosis Code(s):        --- Professional ---                           K64.0, First degree hemorrhoids                           K63.5, Polyp of colon                           K92.1, Melena (includes Hematochezia)                           D62, Acute posthemorrhagic anemia                            K57.30, Diverticulosis of large intestine without                            perforation or abscess without bleeding CPT copyright 2019 [employer]. All rights reserved. The codes documented in this report are preliminary and upon coder review may  be revised to meet current compliance requirements. [name], MD 11/07/2019 2:59:02 PM This report has been signed electronically. Number of Addenda: 0

## 2019-11-07 NOTE — Interval H&P Note (Signed)
History and Physical Interval Note:  11/07/2019 1:57 PM  Logan White  has presented today for surgery, with the diagnosis of GI bleed, anemia.  Constipation.  The various methods of treatment have been discussed with the patient and family. After consideration of risks, benefits and other options for treatment, the patient has consented to  Procedure(s): COLONOSCOPY WITH PROPOFOL (N/A) ESOPHAGOGASTRODUODENOSCOPY (EGD) WITH PROPOFOL (N/A) as a surgical intervention.  The patient's history has been reviewed, patient examined, no change in status, stable for surgery.  I have reviewed the patient's chart and labs.  Questions were answered to the patient's satisfaction.     Pricilla Riffle. Fuller Plan

## 2019-11-08 DIAGNOSIS — D509 Iron deficiency anemia, unspecified: Secondary | ICD-10-CM

## 2019-11-08 DIAGNOSIS — K2970 Gastritis, unspecified, without bleeding: Secondary | ICD-10-CM

## 2019-11-08 DIAGNOSIS — K2990 Gastroduodenitis, unspecified, without bleeding: Secondary | ICD-10-CM

## 2019-11-08 LAB — CBC
HCT: 23.9 % — ABNORMAL LOW (ref 39.0–52.0)
Hemoglobin: 8.2 g/dL — ABNORMAL LOW (ref 13.0–17.0)
MCH: 26.1 pg (ref 26.0–34.0)
MCHC: 34.3 g/dL (ref 30.0–36.0)
MCV: 76.1 fL — ABNORMAL LOW (ref 80.0–100.0)
Platelets: 248 10*3/uL (ref 150–400)
RBC: 3.14 MIL/uL — ABNORMAL LOW (ref 4.22–5.81)
RDW: 18.3 % — ABNORMAL HIGH (ref 11.5–15.5)
WBC: 8.3 10*3/uL (ref 4.0–10.5)
nRBC: 1.3 % — ABNORMAL HIGH (ref 0.0–0.2)

## 2019-11-08 LAB — BASIC METABOLIC PANEL
Anion gap: 13 (ref 5–15)
BUN: 39 mg/dL — ABNORMAL HIGH (ref 8–23)
CO2: 24 mmol/L (ref 22–32)
Calcium: 8.4 mg/dL — ABNORMAL LOW (ref 8.9–10.3)
Chloride: 98 mmol/L (ref 98–111)
Creatinine, Ser: 2.06 mg/dL — ABNORMAL HIGH (ref 0.61–1.24)
GFR calc Af Amer: 35 mL/min — ABNORMAL LOW (ref >60)
GFR calc non Af Amer: 30 mL/min — ABNORMAL LOW (ref >60)
Glucose, Bld: 108 mg/dL — ABNORMAL HIGH (ref 70–99)
Potassium: 3.5 mmol/L (ref 3.5–5.1)
Sodium: 135 mmol/L (ref 135–145)

## 2019-11-08 MED ORDER — PANTOPRAZOLE SODIUM 40 MG PO TBEC: 40.0000 mg | DELAYED_RELEASE_TABLET | Freq: Two times a day (BID) | ORAL | 0 refills | Status: DC

## 2019-11-08 MED ORDER — POTASSIUM CHLORIDE CRYS ER 20 MEQ PO TBCR
20.0000 meq | EXTENDED_RELEASE_TABLET | Freq: Two times a day (BID) | ORAL | Status: DC
  Administered 2019-11-08: 11:00:00 20 meq via ORAL
  Filled 2019-11-08: qty 1

## 2019-11-08 MED ORDER — APIXABAN 5 MG PO TABS: 5.0000 mg | ORAL_TABLET | Freq: Two times a day (BID) | ORAL | 0 refills | Status: DC

## 2019-11-08 MED ORDER — METOPROLOL SUCCINATE ER 100 MG PO TB24
100.0000 mg | ORAL_TABLET | Freq: Every day | ORAL | Status: DC
  Administered 2019-11-08: 11:00:00 100 mg via ORAL
  Filled 2019-11-08: qty 1

## 2019-11-08 MED ORDER — FERROUS SULFATE 325 (65 FE) MG PO TABS: 325.0000 mg | ORAL_TABLET | Freq: Two times a day (BID) | ORAL | 3 refills | Status: DC

## 2019-11-08 MED ORDER — ISOSORBIDE MONONITRATE ER 30 MG PO TB24: 30.0000 mg | ORAL_TABLET | Freq: Every day | ORAL | 1 refills | Status: AC

## 2019-11-08 NOTE — Discharge Summary (Signed)
Physician Discharge Summary  Logan White PZW:[id number] DOB: 09-Apr-1941 DOA: 11/04/2019  PCP: Patient, No Pcp Per  Admit date: 11/04/2019 Discharge date: 11/08/2019  Time spent: 35 minutes  Recommendations for Outpatient Follow-up:  Cardiology Dr. [name] in 2 weeks Needs nephrology referral for stage IV kidney disease GI Dr. Fuller Plan in 1 month   Discharge Diagnoses:  Erosive gastritis Melena Iron deficiency anemia Diverticulosis Right leg pain and weakness Peripheral artery disease Chronic systolic CHF Chronic kidney disease stage IV   Weakness   CHF (congestive heart failure) (HCC)   Acute blood loss anemia   Melena   Benign neoplasm of transverse colon   Microcytic anemia   Gastritis and gastroduodenitis   Discharge Condition: stable  Diet recommendation: Low-sodium, heart healthy  Filed Weights   11/05/19 0014 11/07/19 0540 11/08/19 0408  Weight: 83.1 kg 83.1 kg 83.7 kg    History of present illness:  Logan White is a 79 y.o. M with sCHF EF 25-30%, CAD with isch CM, hx VF arrest no ICD, chronic hypoxic respiratory failure on 2 L, HTN, hx CVA, AAA, CKD stage IV baseline Cr 1.8-2.5, hx cardiac arrest, PVD carotid, persistent AF on Eliquis, and PVD carotids and bilateral LE who presented with pain in the right leg. -In the emergency room he was found to have severe anemia with hemoglobin in the 6 range with heme positive stools and report of melena  Hospital Course:   Right leg weakness and pain Patient presented with acute right leg weakness, pain, poor historian -MRI negative for CVA, appreciate vascular input, suspected to have transient worsening of his PAD symptoms in the setting of anemia, TIA is also possibility -No symptoms at this time  -Physical therapy evaluation completed, home health PT recommended, he is ambulating without any discomfort -last LE angiography in 2017 he had "occluded R external iliac and occluded L SFA" both not percutaneously  addressable.  Acute on chronic CHF/ischemic cardiomyopathy, EF 25% Pulmonary HTN, group 2 Severe right heart failure Coronary artery disease, PAD History of VF arrest Echo 1 month ago showed EF 25 to 30%. Severe RHF, pHTN.  RHC showed PCWP 28 -Improving with diuresis, close to euvolemic, cardiology following -Metoprolol, Imdur, hydralazine -Continue oral torsemide at discharge, follow-up with [facility] in 1 to 2 weeks  Severe anemia Suspected GI blood loss anemia -Presented with melena, hemoglobin of 6.9/7, improved to 9 posttransfusion, now down slightly to 8.6 -Anticoagulation held initially, treated with IV PPI, gastroenterology consulted underwent EGD which showed erosive gastropathy, colonoscopy noted diverticulosis, no active bleeding -Discussed anticoagulation with GI and cardiology, family decision made to resume apixaban on 3/11 -Advised to continue Protonix 40 mg twice daily for 2 weeks followed by daily  Chronic hypoxic respiratory failure COPD -Continue home O2 -Continue ICS-LABA  CKD stage IV Cr slightly up relative to baseline.  Baseline GFR in last few months is 25-35. -Creatinine stable -Torsemide resumed  Persistent atrial fibrillation -Continue metoprolol -Eliquis to be restarted on 3/11  Moderate protein calorie malnutrition As evidenced by weight loss, diminished muscle mass and fat, BMI 22, and chronic organ failure. -Continue Ensure  Hyponatremia -Mild, improved    Consultants:   Cardiology  Vascular surgery  Vascular Cardiology  Gastroenterology   Procedures: EGD 3/4 Impression:               - Benign-appearing esophageal stenosis.                           -  Small hiatal hernia.                           - Erosive gastropathy with no bleeding and no                            stigmata of recent bleeding. Biopsied.                           - Gastritis. Biopsied.                           - Normal duodenal bulb  and second portion of the                            duodenum.   Colonoscopy 3/4  Impression:               - Mild diverticulosis in the right colon.                           - Moderate diverticulosis in the left colon.                           - Three 7 to 8 mm polyps in the transverse colon,                            removed with a cold snare. Resected and retrieved.                            Clips (MR conditional) were placed, 1 at each                            polypectomy site.                           - Internal hemorrhoids.                           - The examination was otherwise normal on direct                            and retroflexion views. Recommendation:           - Resume Eliquis (apixaban) in 5 days at prior                            dose. Refer to managing physician for further                            adjustment of therapy.                            - Resume previous diet  Discharge Exam: Vitals:   11/08/19 0408 11/08/19 0837  BP: 94/63 124/66  Pulse: 87   Resp: 20   Temp: 97.7 F (36.5 C)   SpO2: 100%  General: AAOx2 Cardiovascular: S1S2/RRR Respiratory: CTAB  Discharge Instructions   Discharge Instructions    Diet - low sodium heart healthy   Complete by: As directed    Increase activity slowly   Complete by: As directed      Allergies as of 11/08/2019   No Known Allergies     Medication List    STOP taking these medications   isosorbide dinitrate 20 MG tablet Commonly known as: ISORDIL     TAKE these medications   acetaminophen 325 MG tablet Commonly known as: TYLENOL Take 650 mg by mouth every 6 (six) hours as needed for headache.   albuterol 108 (90 Base) MCG/ACT inhaler Commonly known as: VENTOLIN HFA Inhale 2 puffs into the lungs every 6 (six) hours as needed for wheezing or shortness of breath.   apixaban 5 MG Tabs tablet Commonly known as: Eliquis Take 1 tablet (5 mg total) by mouth 2 (two) times daily. Start  taking on: November 14, 2019 What changed: These instructions start on November 14, 2019. If you are unsure what to do until then, ask your doctor or other care provider.   aspirin 81 MG EC tablet Take 1 tablet (81 mg total) by mouth daily.   atorvastatin 80 MG tablet Commonly known as: LIPITOR Take 80 mg by mouth daily.   Ensure Take 237 mLs by mouth 2 (two) times daily as needed (nutritional supplement).   ferrous sulfate 325 (65 FE) MG tablet Take 1 tablet (325 mg total) by mouth 2 (two) times daily with a meal.   Fluticasone-Salmeterol 250-50 MCG/DOSE Aepb Commonly known as: Advair Diskus Inhale 1 puff into the lungs 2 (two) times daily for 14 days. What changed:   when to take this  reasons to take this   hydrALAZINE 10 MG tablet Commonly known as: APRESOLINE Take 1 tablet (10 mg total) by mouth 2 (two) times daily.   isosorbide mononitrate 30 MG 24 hr tablet Commonly known as: IMDUR Take 1 tablet (30 mg total) by mouth daily.   metoprolol succinate 50 MG 24 hr tablet Commonly known as: TOPROL-XL Take 3 tablets (150 mg total) by mouth daily. Take with or immediately following a meal.   nitroGLYCERIN 0.4 MG SL tablet Commonly known as: Nitrostat Place 1 tablet (0.4 mg total) under the tongue every 5 (five) minutes as needed for chest pain (Up to 3 doses).   pantoprazole 40 MG tablet Commonly known as: PROTONIX Take 1 tablet (40 mg total) by mouth 2 (two) times daily.   potassium chloride SA 20 MEQ tablet Commonly known as: KLOR-CON Take 1 tablet (20 mEq total) by mouth 2 (two) times daily.   torsemide 20 MG tablet Commonly known as: DEMADEX Take 3 tablets (60 mg total) by mouth 2 (two) times daily.            Durable Medical Equipment  (From admission, onward)         Start     Ordered   11/06/19 1451  For home use only DME Walker rolling  Once    Question Answer Comment  Walker: With 5 Inch Wheels   Patient needs a walker to treat with the following  condition Weakness      11/06/19 1451         No Known Allergies Follow-up Information    Benito Mccreedy, MD. Go on 11/12/2019.   Specialty: Internal Medicine Why: @3 :30pm Contact information: [place] [address] [phone]  Lelon Perla, MD Follow up on 11/25/2019.   Specialty: Cardiology Why: at 0800 AM Contact information: [address] [place] [address] [phone]            The results of significant diagnostics from this hospitalization (including imaging, microbiology, ancillary and laboratory) are listed below for reference.    Significant Diagnostic Studies: DG Lumbar Spine 2-3 Views  Result Date: 11/04/2019 CLINICAL DATA:  Right lower extremity weakness EXAM: LUMBAR SPINE - 2-3 VIEW COMPARISON:  None. FINDINGS: Sagittal alignment is within normal limits. Vertebral body heights are grossly maintained. Moderate severe degenerative change at L5-S1. Mild osteophytes at most levels throughout the lumbar spine. Dense aortic atherosclerosis. Ectatic distal abdominal aorta measuring up to 3.2 cm. IMPRESSION: 1. No acute osseous abnormality. 2. Degenerative changes most advanced at L5-S1 3. Dense aortic atherosclerosis. AP diameter of calcified distal abdominal aorta up to 3.2 cm. This may be more thoroughly evaluated with CT, which could be performed on a nonemergent basis unless otherwise clinically indicated. Electronically Signed   By: Donavan Foil M.D.   On: 11/04/2019 17:03   CT Head Wo Contrast  Result Date: 11/04/2019 CLINICAL DATA:  The patient's legs gave way today but the patient did not fall. Initial encounter. EXAM: CT HEAD WITHOUT CONTRAST TECHNIQUE: Contiguous axial images were obtained from the base of the skull through the vertex without intravenous contrast. COMPARISON:  Head CT scan 09/28/2019. FINDINGS: Brain: No evidence of acute infarction, hemorrhage, hydrocephalus, extra-axial collection or mass  lesion/mass effect. Atrophy and chronic microvascular ischemic change noted. Vascular: Atherosclerosis. Skull: Intact. No focal lesion. Sinuses/Orbits: Negative. Other: None. IMPRESSION: 1. No acute abnormality. 2. Atrophy and chronic microvascular ischemic change. 3. Atherosclerosis. Electronically Signed   By: Inge Rise M.D.   On: 11/04/2019 14:26   MR BRAIN WO CONTRAST  Result Date: 11/05/2019 CLINICAL DATA:  TIA EXAM: MRI HEAD WITHOUT CONTRAST TECHNIQUE: Multiplanar, multiecho pulse sequences of the brain and surrounding structures were obtained without intravenous contrast. COMPARISON:  CT head 11/04/2019.  MRI head 01/04/2013 FINDINGS: Brain: Negative for acute infarct. Mild to moderate atrophy with mild chronic microvascular ischemic change in the white matter and pons. No cortical infarct. Chronic microhemorrhage right posterior temporal lobe. Otherwise no hemorrhage or mass. No shift of the midline structures. Vascular: Normal arterial flow voids. Skull and upper cervical spine: No focal skeletal abnormality. Lipoma over the convexity is chronic and benign appearing. Sinuses/Orbits: Mild mucosal edema paranasal sinuses. Negative orbit Other: None IMPRESSION: No acute abnormality. Atrophy and mild chronic microvascular ischemia. Electronically Signed   By: Franchot Gallo M.D.   On: 11/05/2019 18:48   DG Chest Portable 1 View  Result Date: 11/04/2019 CLINICAL DATA:  Right lower extremity weakness and numbness last night for 30 minutes and again today at 10:30 this morning. EXAM: PORTABLE CHEST 1 VIEW COMPARISON:  Single-view of the chest 09/28/2019. PA and lateral chest 06/22/2019. FINDINGS: There is cardiomegaly and pulmonary edema. Small bilateral pleural effusions. No pneumothorax. Atherosclerosis. IMPRESSION: Congestive heart failure. Atherosclerosis. Electronically Signed   By: Inge Rise M.D.   On: 11/04/2019 12:53   VAS [place] ABI WITH/WO TBI  Result Date: 11/05/2019 LOWER EXTREMITY  DOPPLER STUDY Indications: Claudication, and peripheral artery disease.  Limitations: Today's exam was limited due to involuntary patient movement. Comparison Study: AAA ultrasound 03-15-19. Performing Technologist: Baldwin Crown RDMS  Examination Guidelines: A complete evaluation includes at minimum, Doppler waveform signals and systolic blood pressure reading at the level of bilateral brachial, anterior  tibial, and posterior tibial arteries, when vessel segments are accessible. Bilateral testing is considered an integral part of a complete examination. Photoelectric Plethysmograph (PPG) waveforms and toe systolic pressure readings are included as required and additional duplex testing as needed. Limited examinations for reoccurring indications may be performed as noted.  ABI Findings: +---------+------------------+-----+---------+--------+ Right    Rt Pressure (mmHg)IndexWaveform Comment  +---------+------------------+-----+---------+--------+ Brachial 120                    triphasic         +---------+------------------+-----+---------+--------+ PTA                             absent            +---------+------------------+-----+---------+--------+ DP                              absent            +---------+------------------+-----+---------+--------+ Great Toe                       Absent            +---------+------------------+-----+---------+--------+ +---------+------------------+-----+---------+---------------+ Left     Lt Pressure (mmHg)IndexWaveform Comment         +---------+------------------+-----+---------+---------------+ Brachial 122                    triphasic                +---------+------------------+-----+---------+---------------+ PTA      43                0.35          possibly venous +---------+------------------+-----+---------+---------------+ DP                                       possibly venous  +---------+------------------+-----+---------+---------------+ Great Toe                       Absent                   +---------+------------------+-----+---------+---------------+  Summary: Right: Unable to innsonate PTA or DP due to low velocity vs nonexistant waveforms. Left: Resting left ankle-brachial index indicates severe left lower extremity arterial disease. Due to irregularity of waveforms unable to distingusihed between arterial vs venous flow.  *See table(s) above for measurements and observations.  Electronically signed by Deitra Mayo MD on 11/05/2019 at 4:42:49 PM.   Final     Microbiology: Recent Results (from the past 240 hour(s))  SARS CORONAVIRUS 2 (TAT 6-24 HRS) Nasopharyngeal Nasopharyngeal Swab     Status: None   Collection Time: 11/04/19  5:05 PM   Specimen: Nasopharyngeal Swab  Result Value Ref Range Status   SARS Coronavirus 2 NEGATIVE NEGATIVE Final    Comment: (NOTE) SARS-CoV-2 target nucleic acids are NOT DETECTED. The SARS-CoV-2 RNA is generally detectable in upper and lower respiratory specimens during the acute phase of infection. Negative results do not preclude SARS-CoV-2 infection, do not rule out co-infections with other pathogens, and should not be used as the sole basis for treatment or other patient management decisions. Negative results must be combined with clinical observations, patient history, and epidemiological information. The expected result is Negative. Fact Sheet for Patients: SugarRoll.be Fact  Sheet for Healthcare Providers: https://www.woods-mathews.com/ This test is not yet approved or cleared by the [place] FDA and  has been authorized for detection and/or diagnosis of SARS-CoV-2 by FDA under an Emergency Use Authorization (EUA). This EUA will remain  in effect (meaning this test can be used) for the duration of the COVID-19 declaration under Section 56 4(b)(1) of the Act, 21  U.S.C. section 360bbb-3(b)(1), unless the authorization is terminated or revoked sooner. Performed at [facility], [place] [address], [place], [place] [address]   MRSA PCR Screening     Status: None   Collection Time: 11/07/19  9:05 PM   Specimen: Nasal Mucosa; Nasopharyngeal  Result Value Ref Range Status   MRSA by PCR NEGATIVE NEGATIVE Final    Comment:        The GeneXpert MRSA Assay (FDA approved for NASAL specimens only), is one component of a comprehensive MRSA colonization surveillance program. It is not intended to diagnose MRSA infection nor to guide or monitor treatment for MRSA infections. Performed at [facility], [place] [address] [place], [place] [address]      Labs: Basic Metabolic Panel: Recent Labs  Lab 11/04/19 1241 11/05/19 0530 11/05/19 1225 11/06/19 0023 11/07/19 0318 11/08/19 0357  NA 133* 133*  --  135 136 135  K 5.2* 4.7  --  4.3 4.1 3.5  CL 99 101  --  99 99 98  CO2 25 23  --  25 25 24   GLUCOSE 115* 117*  --  97 113* 108*  BUN 46* 54*  --  47* 44* 39*  CREATININE 2.41* 2.66*  --  2.30* 2.27* 2.06*  CALCIUM 8.3* 8.2*  --  8.4* 8.7* 8.4*  MG  --   --  2.9*  --   --   --    Liver Function Tests: Recent Labs  Lab 11/04/19 1241  AST 29  ALT 20  ALKPHOS 124  BILITOT 1.4*  PROT 5.6*  ALBUMIN 2.9*   No results for input(s): LIPASE, AMYLASE in the last 168 hours. No results for input(s): AMMONIA in the last 168 hours. CBC: Recent Labs  Lab 11/04/19 1944 11/05/19 0910 11/06/19 0023 11/07/19 0318 11/08/19 0357  WBC 5.3 5.6 9.1 9.5 8.3  NEUTROABS 3.1  --   --   --   --   HGB 7.0* 6.9* 9.3* 8.6* 8.2*  HCT 21.0* 20.6* 26.8* 24.8* 23.9*  MCV 75.3* 74.4* 76.4* 76.3* 76.1*  PLT 257 252 264 234 248   Cardiac Enzymes: No results for input(s): CKTOTAL, CKMB, CKMBINDEX, TROPONINI in the last 168 hours. BNP: BNP (last 3 results) Recent Labs    06/22/19 1844 09/28/19 1829 11/04/19 1241  BNP 1,512.4* 2,423.4* 1,655.3*     ProBNP (last 3 results) Recent Labs    08/15/19 0949  PROBNP 16,511*    CBG: No results for input(s): GLUCAP in the last 168 hours.     Signed:  Domenic Polite MD.  [facility] 11/08/2019, 10:00 AM

## 2019-11-08 NOTE — Plan of Care (Signed)
  Problem: Activity: Goal: Risk for activity intolerance will decrease Outcome: Completed/Met   Problem: Nutrition: Goal: Adequate nutrition will be maintained Outcome: Completed/Met   Problem: Coping: Goal: Level of anxiety will decrease Outcome: Completed/Met

## 2019-11-08 NOTE — Progress Notes (Signed)
Progress Note  Patient Name: Logan White Date of Encounter: 11/08/2019  Primary Cardiologist: Kirk Ruths, MD   Subjective   No CP or dyspnea  Inpatient Medications    Scheduled Meds: . atorvastatin  80 mg Oral Daily  . feeding supplement (ENSURE ENLIVE)  237 mL Oral BID BM  . ferrous sulfate  325 mg Oral BID WC  . hydrALAZINE  10 mg Oral TID  . isosorbide mononitrate  30 mg Oral Daily  . metoprolol tartrate  50 mg Oral BID  . mometasone-formoterol  2 puff Inhalation BID  . pantoprazole  40 mg Oral BID  . polyethylene glycol  17 g Oral Daily  . sodium chloride flush  3 mL Intravenous Q12H  . torsemide  60 mg Oral BID   Continuous Infusions: . sodium chloride     PRN Meds: sodium chloride, acetaminophen, albuterol, guaiFENesin-dextromethorphan, ondansetron (ZOFRAN) IV, oxyCODONE, sodium chloride flush   Vital Signs    Vitals:   11/07/19 1943 11/07/19 2019 11/07/19 2100 11/08/19 0408  BP: (!) 120/58   94/63  Pulse: 90 88 88 87  Resp: 19 18 18 20   Temp:    97.7 F (36.5 C)  TempSrc:    Oral  SpO2: 100% 100% 100% 100%  Weight:    83.7 kg  Height:        Intake/Output Summary (Last 24 hours) at 11/08/2019 3716 Last data filed at 11/08/2019 0600 Gross per 24 hour  Intake 840 ml  Output 575 ml  Net 265 ml   Last 3 Weights 11/08/2019 11/07/2019 11/05/2019  Weight (lbs) 184 lb 8 oz 183 lb 4.8 oz 183 lb 3.2 oz  Weight (kg) 83.689 kg 83.144 kg 83.099 kg  Some encounter information is confidential and restricted. Go to Review Flowsheets activity to see all data.      Telemetry   Atrial fibrillation rate controlled; personally reviewed.  Physical Exam   GEN: NAD, WD WN Neck: supple, no JVD Cardiac: irregular Respiratory: CTA GI: Soft, NT/ND MS: 1+ edema Neuro: Grossly intact Psych: Normal affect   Labs   Chemistry Recent Labs  Lab 11/04/19 1241 11/05/19 0530 11/06/19 0023 11/07/19 0318 11/08/19 0357  NA 133*   < > 135 136 135  K 5.2*   < > 4.3  4.1 3.5  CL 99   < > 99 99 98  CO2 25   < > 25 25 24   GLUCOSE 115*   < > 97 113* 108*  BUN 46*   < > 47* 44* 39*  CREATININE 2.41*   < > 2.30* 2.27* 2.06*  CALCIUM 8.3*   < > 8.4* 8.7* 8.4*  PROT 5.6*  --   --   --   --   ALBUMIN 2.9*  --   --   --   --   AST 29  --   --   --   --   ALT 20  --   --   --   --   ALKPHOS 124  --   --   --   --   BILITOT 1.4*  --   --   --   --   GFRNONAA 25*   < > 27* 27* 30*  GFRAA 29*   < > 31* 31* 35*  ANIONGAP 9   < > 11 12 13    < > = values in this interval not displayed.     Hematology Recent Labs  Lab 11/06/19 0023 11/07/19 9678  11/08/19 0357  WBC 9.1 9.5 8.3  RBC 3.51* 3.25* 3.14*  HGB 9.3* 8.6* 8.2*  HCT 26.8* 24.8* 23.9*  MCV 76.4* 76.3* 76.1*  MCH 26.5 26.5 26.1  MCHC 34.7 34.7 34.3  RDW 17.6* 17.8* 18.3*  PLT 264 234 248    BNP Recent Labs  Lab 11/04/19 1241  BNP 1,655.3*     Patient Profile     79 y.o. male with past medical history of coronary disease, ischemic cardiomyopathy, mitral regurgitation, previous ventricular fibrillation arrest, chronic systolic congestive heart failure, chronic stage III kidney disease, peripheral vascular disease admitted with acute on chronic systolic congestive heart failure and intermittent right lower extremity numbness, weakness and pain. Echocardiogram January 2021 showed ejection fraction 25 to 30%, mild left ventricular hypertrophy, severe left ventricular enlargement, severe RV dysfunction, biatrial enlargement, moderate mitral regurgitation and mild aortic insufficiency. Right heart cath January 2021 showed pulmonary capillary wedge pressure 28, PA pressure 63/29 and RA pressure of 20.   Assessment & Plan    1 acute on chronic systolic congestive heart failure-volume status appears to be stable today.  Continue present dose of Demadex.  Potassium down to 3.5.  Resume K-Dur 20 mEq twice daily.  Patient can be discharged from a cardiac standpoint.  2 right lower extremity pain,  transient numbness, transient weakness-no recurrent episodes.  Etiology remains unclear to me.  MRI negative.  It is possible that patient became anemic from his GI bleed which exacerbated his vascular insufficiency to the right lower extremity.  However presentation would be unusual as he states he had sudden loss of use of his right lower extremity.  His symptoms have improved.  Vascular surgery has seen and medical therapy has been recommended.    3 ischemic cardiomyopathy-continue beta-blocker (change metoprolol to toprol 100 mg daily), hydralazine and nitrates.  No ARB or Entresto given renal insufficiency.  4 mitral regurgitation-moderate on recent transesophageal echocardiogram.  5 chronic stage III kidney disease-renal function improved this morning following transfusion.  6 permanent atrial fibrillation-continue beta-blocker for rate control (change to toprol 100 mg daily as outlined above rate controlled on telemetry).  Patient has undergone EGD and colonoscopy in GI as recommended holding apixaban for 5 days.  Would resume 5 mg twice daily on March 11.  Would continue off of aspirin.  7 anemia-EGD shows gastritis and colonoscopy showed diverticulosis an polyps (removed).  Discontinue aspirin permanently.  Resume apixaban on March 11 as described above. Continue Protonix.  [facility] will sign off.   Medication Recommendations: Continue present medications at discharge.  Resume apixaban 5 mg twice daily on March 11. Other recommendations (labs, testing, etc): Check CBC and bmet 1 week following discharge. Follow up as an outpatient:  APP 1-2 weeks; FU with me 3 months.  For questions or updates, please contact [place] Please consult [email or web address] for contact info under        Signed, Kirk Ruths, MD  11/08/2019, 8:22 AM

## 2019-11-08 NOTE — Progress Notes (Signed)
      Progress Note   Subjective  Patient states he is feeling better. No bowel movements overnight. S/p EGD and colonoscopy yesterday. No pains. Wants to go home. Tolerating diet.   Objective   Vital signs in last 24 hours: Temp:  [97.5 F (36.4 C)-97.7 F (36.5 C)] 97.7 F (36.5 C) (03/05 0408) Pulse Rate:  [71-90] 87 (03/05 0408) Resp:  [18-24] 20 (03/05 0408) BP: (94-133)/(54-85) 124/66 (03/05 0837) SpO2:  [97 %-100 %] 100 % (03/05 0408) Weight:  [83.7 kg] 83.7 kg (03/05 0408) Last BM Date: 11/07/19(bowel prep yesterday ) General:    AA male in NAD Heart:  irregular Lungs: Respirations even and unlabored, lungs CTA bilaterally Abdomen:  Soft, nontender and nondistended.  Extremities:  (+) 1 edema. Neurologic:  Alert and oriented,  grossly normal neurologically. Psych:  Cooperative. Normal mood and affect.  Intake/Output from previous day: 03/04 0701 - 03/05 0700 In: 1320 [P.O.:1320] Out: 575 [Urine:575] Intake/Output this shift: No intake/output data recorded.  Lab Results: Recent Labs    11/06/19 0023 11/07/19 0318 11/08/19 0357  WBC 9.1 9.5 8.3  HGB 9.3* 8.6* 8.2*  HCT 26.8* 24.8* 23.9*  PLT 264 234 248   BMET Recent Labs    11/06/19 0023 11/07/19 0318 11/08/19 0357  NA 135 136 135  K 4.3 4.1 3.5  CL 99 99 98  CO2 25 25 24   GLUCOSE 97 113* 108*  BUN 47* 44* 39*  CREATININE 2.30* 2.27* 2.06*  CALCIUM 8.4* 8.7* 8.4*   LFT No results for input(s): PROT, ALBUMIN, AST, ALT, ALKPHOS, BILITOT, BILIDIR, IBILI in the last 72 hours. PT/INR No results for input(s): LABPROT, INR in the last 72 hours.  Studies/Results: No results found.     Assessment / Plan:   79 y/o male with history of CAD / CHF on aspirin and Eliquis, presented with worsening CHF, microcytic anemia (suspected iron deficiency), melena.   EGD yesterday showed erosive gastritis as the likely cause of anemia in the setting of anticoagulation. Biopsies taken to test for H pylori.  Colonoscopy remarkable for a few polyps that were removed, hemostasis clips placed for active oozing but no bleeding since procedure. BUN downtrending. Tolerating diet.  Recommend: - continue protonix 40mg  BID for 2 weeks and then once daily thereafter - cardiology has discontinued aspirin permanently - plan on resuming Eliquis in 5 days - await pathology results - treat for H pylori if positive - avoid all NSAIDs - continue empiric iron supplementation for suspected iron deficiency - ferrous sulfate BID - we will coordinate outpatient follow up with Dr. Fuller Plan following discharge  Signing off for now, please call with questions moving forward.  [place] Cellar, MD [facility]

## 2019-11-11 ENCOUNTER — Telehealth: Payer: Self-pay

## 2019-11-11 ENCOUNTER — Other Ambulatory Visit: Payer: Self-pay | Admitting: Physician Assistant

## 2019-11-11 ENCOUNTER — Encounter: Payer: Self-pay | Admitting: Gastroenterology

## 2019-11-11 DIAGNOSIS — I4821 Permanent atrial fibrillation: Secondary | ICD-10-CM | POA: Diagnosis not present

## 2019-11-11 DIAGNOSIS — I739 Peripheral vascular disease, unspecified: Secondary | ICD-10-CM | POA: Diagnosis not present

## 2019-11-11 DIAGNOSIS — I5082 Biventricular heart failure: Secondary | ICD-10-CM | POA: Diagnosis not present

## 2019-11-11 DIAGNOSIS — N184 Chronic kidney disease, stage 4 (severe): Secondary | ICD-10-CM | POA: Diagnosis not present

## 2019-11-11 DIAGNOSIS — I2510 Atherosclerotic heart disease of native coronary artery without angina pectoris: Secondary | ICD-10-CM | POA: Diagnosis not present

## 2019-11-11 DIAGNOSIS — J449 Chronic obstructive pulmonary disease, unspecified: Secondary | ICD-10-CM | POA: Diagnosis not present

## 2019-11-11 DIAGNOSIS — I6523 Occlusion and stenosis of bilateral carotid arteries: Secondary | ICD-10-CM | POA: Diagnosis not present

## 2019-11-11 DIAGNOSIS — I130 Hypertensive heart and chronic kidney disease with heart failure and stage 1 through stage 4 chronic kidney disease, or unspecified chronic kidney disease: Secondary | ICD-10-CM | POA: Diagnosis not present

## 2019-11-11 DIAGNOSIS — I5023 Acute on chronic systolic (congestive) heart failure: Secondary | ICD-10-CM | POA: Diagnosis not present

## 2019-11-11 LAB — SURGICAL PATHOLOGY

## 2019-11-11 NOTE — Telephone Encounter (Signed)
Follow up appt scheduled with the patient for 12/18/19 10:50

## 2019-11-11 NOTE — Telephone Encounter (Signed)
Returning nurse's call. Advised of the upcoming appointment. Confirmed he will be here. I also sent him a letter with date and time of appointment.

## 2019-11-11 NOTE — Progress Notes (Signed)
HPI: FU CHF, atrial fibrillation, CAD, previous V fib arrest and ischemic CM. Patient with a history of PCI of his LAD in October 2004. He was admitted in June of 2013 following a ventricular fibrillation arrest that was felt related to hypokalemia (K 2.2). Patient improved following that episode. Patient admitted in April of 2014 following a ventricular fibrillation arrest. Cardiac catheterization in April of 2014 showed a patent stent in the LAD with a 40% in-stent restenosis. There was an apical 80% lesion. The circumflex was occluded. The right coronary artery was small and nondominant. The patient had attempt at PCI of the occluded circumflex but was unsuccessful. Patient was seen by Dr. Lovena Le and ICD felt not indicated as pt ruled in for non-ST elevation myocardial infarction. He was discharged with a life vest. Nuclear study January 2017 showed fixed inferior and apical defect but no ischemia. Abdominal ultrasound 7/20 showed 3 cm abdominal aortic aneurysm. Carotid Dopplers December 2020 showed 40 to 59% right and left stenosis. Transesophageal echocardiogram December 2020 showed ejection fraction 25 to 30%, biatrial enlargement, reduced RV function, moderate mitral regurgitation (3+), mild tricuspid regurgitation, mild aortic insufficiency.  Seen with syncope January 2021.  Echocardiogram January 2021 showed ejection fraction 25 to 30%, severe left ventricular enlargement, severe RV dysfunction with mild right ventricular enlargement, severe left atrial and right atrial enlargement, mild aortic insufficiency, moderate mitral regurgitation.  Right heart catheterization January 2021 showed pulmonary capillary wedge pressure 28.  Was to follow-up with Dr. Lovena Le as an outpatient for consideration of ICD.  3/21 EGD showed gastritis and colonoscopy showed diverticulosis and polyps which were removed.  Patient readmitted March 2021 with CHF and improved with IV diuresis.  Due to continuing claudication  patient had angiogram which revealed an occluded right iliac and right common femoral artery, multiple areas of stenosis in the left common and external iliac artery, significant stenosis within the common left femoral artery.  Plan was ultimately to proceed with left iliac stenting, bilateral femoral endarterectomy and left to right femorofemoral bypass.  Since patient was discharged 4 days ago he describes progressive dyspnea on exertion, bilateral lower extremity edema and bilateral lower extremity pain worse with ambulation but occurs at rest as well.  He denies orthopnea, chest pain, fevers, chills or productive cough.  Patient is markedly short of breath in the office.  Current Outpatient Medications  Medication Sig Dispense Refill  . acetaminophen (TYLENOL) 325 MG tablet Take 650 mg by mouth every 6 (six) hours as needed for headache.    . albuterol (VENTOLIN HFA) 108 (90 Base) MCG/ACT inhaler Inhale 2 puffs into the lungs every 6 (six) hours as needed for wheezing or shortness of breath. 90 g 3  . apixaban (ELIQUIS) 5 MG TABS tablet Take 1 tablet (5 mg total) by mouth 2 (two) times daily. 60 tablet 0  . atorvastatin (LIPITOR) 80 MG tablet Take 80 mg by mouth daily.    . Ensure (ENSURE) Take 237 mLs by mouth 2 (two) times daily as needed (nutritional supplement).    . Fluticasone-Salmeterol (ADVAIR DISKUS) 250-50 MCG/DOSE AEPB Inhale 1 puff into the lungs 2 (two) times daily for 14 days. (Patient taking differently: Inhale 1 puff into the lungs 2 (two) times daily as needed (shortness of breath). ) 28 each 0  . hydrALAZINE (APRESOLINE) 10 MG tablet Take 1 tablet (10 mg total) by mouth 2 (two) times daily. 180 tablet 0  . isosorbide mononitrate (IMDUR) 30 MG 24 hr tablet Take  1 tablet (30 mg total) by mouth daily. 30 tablet 1  . metoprolol succinate (TOPROL-XL) 50 MG 24 hr tablet Take 1 tablet (50 mg total) by mouth daily. Take with or immediately following a meal. 30 tablet 3  . nitroGLYCERIN  (NITROSTAT) 0.4 MG SL tablet Place 1 tablet (0.4 mg total) under the tongue every 5 (five) minutes as needed for chest pain (Up to 3 doses). 25 tablet 4  . oxymetazoline (AFRIN) 0.05 % nasal spray Place 1 spray into both nostrils 2 (two) times daily as needed for congestion.    . pantoprazole (PROTONIX) 40 MG tablet Take 1 tablet (40 mg total) by mouth 2 (two) times daily. 60 tablet 0  . potassium chloride SA (KLOR-CON) 20 MEQ tablet Take 1 tablet (20 mEq total) by mouth 2 (two) times daily. 120 tablet 0  . torsemide (DEMADEX) 100 MG tablet Take 1 tablet (100 mg total) by mouth 2 (two) times daily. 120 tablet 2   No current facility-administered medications for this visit.     Past Medical History:  Diagnosis Date  . AAA (abdominal aortic aneurysm) ([place])    a. 08/2015 Abd U/S: 2.7 x 2.9 x 3.2 cm infrarenal AAA.  . Bradycardia    a. Requiring discontinuation of use of BB/CCB  . Cardiac arrest - ventricular fibrillation    a. 03/2012 in setting of hypokalemia (prolonged hosp with VDRF, tracheobronchitis, ARF, shock liver, PAF, AMS felt secondary to post-anoxic encephalopathy/shock).  . Carotid artery stenosis    a. 01/2011 - 40-59% bilateral stenosis;  b. 06/2015 Carotid U/S: 40-59% bilat ICA stenosis->f/u 6 mos.  . CKD (chronic kidney disease), stage III   . Coronary artery disease    a. s/p aborted ant STEMI tx with Cypher DES to LAD 10/04 (residual at cath: D1 50%, CFX 40% and multiple dist 70%, EF 55%);   b. myoview 3/10: Ef 47%, infero-apical isch, LOW RISK - med Tx recommended;  c. 12/2012 VF Arrest/Cath: LAD 40isr, 80 apical, LCX 100 (failed PTCA), RCA nondom.  . Diabetes mellitus   . Diverticulosis 2001  . Elevated LFTs    Shock liver 03/2012  . H/O: CVA (cardiovascular accident)   . Hematemesis    a. 12/2010 felt 2/2 Mallory Weiss tear - pt could not afford colonscopy/EGD at that time so Coumadin was deferred. Coumadin initiated 03/2012 without any evidence for bleeding.  Marland Kitchen History of  Ischemic Cardiomyopathy    a. EF 50-55% by echo 03/09/12 (was 30% by echo 02/24/12); b. 03/2013 Echo: EF 55-60%, mild MR, sev dil LA, mod dil RA, PASP 33mmHg.  Marland Kitchen Hypertensive heart disease    a. echo 4/12: EF 50%, asymmetric septal hypertrophy, no SAM or LVOT gradient, LAE, PASP 35  . Inguinal hernia   . 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 Mitral regurgitation    a. Mild by echo 03/2012 and 03/2013.  . Mixed Hyperlipidemia   . Peripheral vascular disease ([employer] [place])    a. h/o LE angioplasty;  b. 08/2015 Duplex: >50% RCIA, 100% REIA, >50% LEIA, elev vel in SMA, patent IVC.  Marland Kitchen Persistent atrial fibrillation ([place])    a. Dx 12/2010-->coumadin (CHA2DS2VASc = 7).  . QT prolongation     Past Surgical History:  Procedure Laterality Date  . ABDOMINAL AORTOGRAM N/A 11/19/2019   Procedure: ABDOMINAL AORTOGRAM;  Surgeon: Serafina Mitchell, MD;  Location: [place] CV LAB;  Service: Cardiovascular;  Laterality: N/A;  . BIOPSY  11/07/2019   Procedure: BIOPSY;  Surgeon:  [name], MD;  Location: [facility] ENDOSCOPY;  Service: Endoscopy;;  . COLONOSCOPY WITH PROPOFOL N/A 11/07/2019   Procedure: COLONOSCOPY WITH PROPOFOL;  Surgeon: [name], MD;  Location[facility];  Service: Endoscopy;  Laterality: N/A;  . ESOPHAGOGASTRODUODENOSCOPY (EGD) WITH PROPOFOL N/A 11/07/2019   Procedure: ESOPHAGOGASTRODUODENOSCOPY (EGD) WITH PROPOFOL;  Surgeon: [name], MD;  Location: [facility] ENDOSCOPY;  Service: Endoscopy;  Laterality: N/A;  . HEMOSTASIS CLIP PLACEMENT  11/07/2019   Procedure: HEMOSTASIS CLIP PLACEMENT;  Surgeon: [name], MD;  Location: [facility] ENDOSCOPY;  Service: Endoscopy;;  . HERNIA REPAIR    . LEFT HEART CATH N/A 12/30/2012   Procedure: LEFT HEART CATH;  Surgeon: [name], MD;  Location: [facility] CATH LAB;  Service: Cardiovascular;  Laterality: N/A;  . LOWER EXTREMITY ANGIOGRAPHY Bilateral 11/19/2019   Procedure: Lower Extremity Angiography;  Surgeon: Serafina Mitchell, MD;  Location: [place] CV LAB;  Service: Cardiovascular;  Laterality: Bilateral;  . ORIF TIBIA & FIBULA FRACTURES  01/11/07   OPEN TX OF UNICONDYLAR PLATEAU FRACTURE, IRRIGATION/DEBRIDEMENT OF OPEN FRACTURE INCLUDING BONE, REMOVAL OF EXTERNAL FIXATOR UNDER ANESTHESIA PER DR. MICHAEL HANDY  . PERIPHERAL VASCULAR CATHETERIZATION N/A 12/21/2015   Procedure: Lower Extremity Angiography;  Surgeon: [name], MD;  Location: [place] CV LAB;  Service: Cardiovascular;  Laterality: N/A;  . POLYPECTOMY  11/07/2019   Procedure: POLYPECTOMY;  Surgeon: [name], MD;  Location: [place];  Service: Endoscopy;;  . POPLITEAL ARTERY ANGIOPLASTY  01/07/07   s/p LEFT POPLITEAL ARTERY EXPLORATION AND VEIN PATCH ANGIOPLASTY, PER DR. EARLY, SECONDARY TO ISCHEMIC LEFT FOOT RELATED TO LEFT POPLITEAL ARTERY INJURY  . RIGHT HEART CATH N/A 10/02/2019   Procedure: RIGHT HEART CATH;  Surgeon: [name], MD;  Location: [place] CV LAB;  Service: Cardiovascular;  Laterality: N/A;  . SKIN GRAFT    . TEE WITHOUT CARDIOVERSION N/A 08/08/2019   Procedure: TRANSESOPHAGEAL ECHOCARDIOGRAM (TEE);  Surgeon: Lelon Perla, MD;  Location: [facility] ENDOSCOPY;  Service: Cardiovascular;  Laterality: N/A;    Social History   Socioeconomic History  . Marital status: Married    Spouse name: Not on file  . Number of children: 4  . Years of education: Not on file  . Highest education level: Not on file  Occupational History  . 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 Smoking status: Former Smoker    Packs/day: 0.50    Years: 30.00    Pack years: 15.00    Types: Cigarettes    Quit date: 03/12/2012    Years since quitting: 7.7  . Smokeless tobacco: Never Used  Substance and Sexual Activity  . Alcohol use: No    Alcohol/week: 0.0 standard drinks  . Drug use: No    Frequency: 2.0 times per week  . Sexual activity: Not on file  Other Topics Concern  . 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 YES. 1/2 -1 PPD OF CIGARETTES    NO ETOH   Social Determinants of Health   Financial Resource Strain:   . Difficulty of Paying Living Expenses:   Food Insecurity:   . Worried About Charity fundraiser in the Last Year:   . Arboriculturist in the Last Year:   Transportation Needs:   .  Lack of Transportation (Medical):   Marland Kitchen Lack of Transportation (Non-Medical):   Physical Activity:   . Days of Exercise per Week:   . Minutes of Exercise per Session:   Stress:   . Feeling of Stress :   Social Connections:   . Frequency of Communication with Friends and Family:   . Frequency of Social Gatherings with Friends and Family:   . Attends Religious Services:   . Active Member of Clubs or Organizations:   . Attends Archivist Meetings:   Marland Kitchen Marital Status:   Intimate Partner Violence:   . Fear of Current or Ex-Partner:   . Emotionally Abused:   Marland Kitchen Physically Abused:   . Sexually Abused:     Family History  Problem Relation Age of Onset  . Heart disease Father        ALSO UNCLE DIED HAD CAD  . Heart failure Father   . Brain cancer Mother   . Leukemia Mother   . Sickle cell anemia Mother   . Sickle cell anemia Other     ROS: no fevers or chills, productive cough, hemoptysis, dysphasia, odynophagia, melena, hematochezia, dysuria, hematuria, rash, seizure activity. Remaining systems are negative.  Physical Exam: Well-developed well-nourished; mild distress HEENT normal with normal eyelids. Neck supple.  Positive jugular venous distention. Skin is warm and dry.  Neck is supple.  Positive JVD. Chest with mild expiratory wheeze Cardiovascular exam is irregular Abdominal exam not tender.  Mildly distended.  Positive presacral edema. Extremities show 3+ edema. neuro grossly intact  ECG-atrial fibrillation with PVCs or aberrantly conducted  beats, cannot rule out septal infarct, lateral T wave inversion.  A/P  1 acute on chronic systolic congestive heart failure-patient markedly volume overloaded on examination.  Will need to be admitted to step down.  Will begin IV Lasix 120 mg IV twice daily.  Follow renal function closely.  2 permanent atrial fibrillation-continue beta-blocker for rate control.  Continue apixaban.    3 peripheral vascular disease-patient has severe peripheral vascular disease.  He is being considered for left iliac stenting, bilateral iliac endarterectomy and left to right femorofemoral bypass.  Given his comorbidities he would be high risk for any procedure.  However his symptoms are markedly worse and limiting.  He has pain at rest.  We will treat for congestive heart failure and once this improves will ask vascular surgery to reassess.  Patient will need pain medications while in the hospital.  4 coronary artery disease-no chest pain.  Continue statin.  No aspirin given need for apixaban.  5 ischemic cardiomyopathy-continue beta-blocker.  Continue hydralazine/nitrates.  Will avoid ARB given renal insufficiency.  If he improves will need to follow-up with Dr. Lovena Le as an outpatient for consideration of ICD.  6 carotid artery disease-follow-up carotid Dopplers December 2021.  7 abdominal aortic aneurysm-follow-up ultrasound July 2022.  8 chronic stage III kidney disease-follow renal function closely with diuresis.  9 Mitral regurgitation-moderate on most recent TEE and echocardiogram.  10 hyperlipidemia-continue statin.  Kirk Ruths, MD

## 2019-11-11 NOTE — Telephone Encounter (Signed)
-----   Message from Yetta Flock, MD sent at 11/08/2019  9:04 AM EST ----- Regarding: Dr. Fuller Plan outpatient follow up Vicenta Aly,  This patient is Dr. Lynne Leader, should be discharged later today or tomorrow, in need of some routine office follow up with him or one of the PAs in the next month 4-6 weeks or so, if openings sooner that is fine as well. Thanks

## 2019-11-12 DIAGNOSIS — Z0001 Encounter for general adult medical examination with abnormal findings: Secondary | ICD-10-CM | POA: Diagnosis not present

## 2019-11-12 DIAGNOSIS — Z136 Encounter for screening for cardiovascular disorders: Secondary | ICD-10-CM | POA: Diagnosis not present

## 2019-11-12 DIAGNOSIS — Z1329 Encounter for screening for other suspected endocrine disorder: Secondary | ICD-10-CM | POA: Diagnosis not present

## 2019-11-12 DIAGNOSIS — J449 Chronic obstructive pulmonary disease, unspecified: Secondary | ICD-10-CM | POA: Diagnosis not present

## 2019-11-12 DIAGNOSIS — I10 Essential (primary) hypertension: Secondary | ICD-10-CM | POA: Diagnosis not present

## 2019-11-12 DIAGNOSIS — Z01021 Encounter for examination of eyes and vision following failed vision screening with abnormal findings: Secondary | ICD-10-CM | POA: Diagnosis not present

## 2019-11-12 DIAGNOSIS — E7800 Pure hypercholesterolemia, unspecified: Secondary | ICD-10-CM | POA: Diagnosis not present

## 2019-11-12 DIAGNOSIS — Z131 Encounter for screening for diabetes mellitus: Secondary | ICD-10-CM | POA: Diagnosis not present

## 2019-11-12 DIAGNOSIS — Z125 Encounter for screening for malignant neoplasm of prostate: Secondary | ICD-10-CM | POA: Diagnosis not present

## 2019-11-13 ENCOUNTER — Encounter: Payer: Self-pay | Admitting: Gastroenterology

## 2019-11-13 DIAGNOSIS — Z0001 Encounter for general adult medical examination with abnormal findings: Secondary | ICD-10-CM | POA: Diagnosis not present

## 2019-11-13 DIAGNOSIS — Z125 Encounter for screening for malignant neoplasm of prostate: Secondary | ICD-10-CM | POA: Diagnosis not present

## 2019-11-13 DIAGNOSIS — Z01021 Encounter for examination of eyes and vision following failed vision screening with abnormal findings: Secondary | ICD-10-CM | POA: Diagnosis not present

## 2019-11-13 DIAGNOSIS — Z1329 Encounter for screening for other suspected endocrine disorder: Secondary | ICD-10-CM | POA: Diagnosis not present

## 2019-11-13 DIAGNOSIS — Z131 Encounter for screening for diabetes mellitus: Secondary | ICD-10-CM | POA: Diagnosis not present

## 2019-11-13 DIAGNOSIS — Z136 Encounter for screening for cardiovascular disorders: Secondary | ICD-10-CM | POA: Diagnosis not present

## 2019-11-14 ENCOUNTER — Telehealth: Payer: Self-pay | Admitting: Cardiology

## 2019-11-14 NOTE — Telephone Encounter (Signed)
Spoke with pt's wife and pt has noted swelling to both thighs more on the left than right for  2 days no change in SOB and weight appears stable pt did have tacos day before and tenderloin biscuit yesterday and could have more water intake than was suppose to Pt currently taking Torsemide 60 mg bid Will forward message to Dr [name] for review Pt has f/u appt on 11/25/19 at 8:00 am with Dr [name] .Adonis Housekeeper

## 2019-11-14 NOTE — Telephone Encounter (Signed)
Spoke with pt wife, Aware of dr crenshaw's recommendations.  

## 2019-11-14 NOTE — Telephone Encounter (Signed)
Take 80 mg of Demadex twice daily for 2 days then resume 60 mg twice daily.  Follow-up as scheduled. Kirk Ruths

## 2019-11-14 NOTE — Telephone Encounter (Signed)
Pt c/o swelling: STAT is pt has developed SOB within 24 hours  1) How much weight have you gained and in what time span? No   2) If swelling, where is the swelling located? Left thigh   3) Are you currently taking a fluid pill? No, but he was on the past.   4) Are you currently SOB? Patient is on oxygen   5) Do you have a log of your daily weights (if so, list)? No   6) Have you gained 3 pounds in a day or 5 pounds in a week? No   7) Have you traveled recently? NO

## 2019-11-15 ENCOUNTER — Emergency Department (HOSPITAL_COMMUNITY): Payer: Medicare HMO

## 2019-11-15 ENCOUNTER — Other Ambulatory Visit: Payer: Self-pay

## 2019-11-15 ENCOUNTER — Encounter (HOSPITAL_COMMUNITY): Payer: Self-pay | Admitting: Emergency Medicine

## 2019-11-15 ENCOUNTER — Inpatient Hospital Stay (HOSPITAL_COMMUNITY)
Admission: EM | Admit: 2019-11-15 | Discharge: 2019-11-21 | DRG: 299 | Disposition: A | Discharge status: Home Health | Payer: Medicare HMO | Attending: Family Medicine | Admitting: Family Medicine

## 2019-11-15 ENCOUNTER — Ambulatory Visit (HOSPITAL_COMMUNITY)
Admission: RE | Admit: 2019-11-15 | Payer: Medicare HMO | Source: Ambulatory Visit | Attending: Physician Assistant | Admitting: Physician Assistant

## 2019-11-15 DIAGNOSIS — I714 Abdominal aortic aneurysm, without rupture, unspecified: Secondary | ICD-10-CM | POA: Diagnosis present

## 2019-11-15 DIAGNOSIS — Z9981 Dependence on supplemental oxygen: Secondary | ICD-10-CM

## 2019-11-15 DIAGNOSIS — I745 Embolism and thrombosis of iliac artery: Secondary | ICD-10-CM | POA: Diagnosis present

## 2019-11-15 DIAGNOSIS — Z9861 Coronary angioplasty status: Secondary | ICD-10-CM

## 2019-11-15 DIAGNOSIS — E782 Mixed hyperlipidemia: Secondary | ICD-10-CM | POA: Diagnosis present

## 2019-11-15 DIAGNOSIS — I70223 Atherosclerosis of native arteries of extremities with rest pain, bilateral legs: Secondary | ICD-10-CM | POA: Diagnosis present

## 2019-11-15 DIAGNOSIS — I4819 Other persistent atrial fibrillation: Secondary | ICD-10-CM | POA: Diagnosis present

## 2019-11-15 DIAGNOSIS — Z955 Presence of coronary angioplasty implant and graft: Secondary | ICD-10-CM

## 2019-11-15 DIAGNOSIS — I2729 Other secondary pulmonary hypertension: Secondary | ICD-10-CM | POA: Diagnosis present

## 2019-11-15 DIAGNOSIS — J9611 Chronic respiratory failure with hypoxia: Secondary | ICD-10-CM | POA: Diagnosis present

## 2019-11-15 DIAGNOSIS — I771 Stricture of artery: Secondary | ICD-10-CM | POA: Diagnosis present

## 2019-11-15 DIAGNOSIS — N179 Acute kidney failure, unspecified: Secondary | ICD-10-CM | POA: Diagnosis present

## 2019-11-15 DIAGNOSIS — Z20822 Contact with and (suspected) exposure to covid-19: Secondary | ICD-10-CM | POA: Diagnosis not present

## 2019-11-15 DIAGNOSIS — J449 Chronic obstructive pulmonary disease, unspecified: Secondary | ICD-10-CM | POA: Diagnosis present

## 2019-11-15 DIAGNOSIS — I4821 Permanent atrial fibrillation: Secondary | ICD-10-CM | POA: Diagnosis not present

## 2019-11-15 DIAGNOSIS — Z832 Family history of diseases of the blood and blood-forming organs and certain disorders involving the immune mechanism: Secondary | ICD-10-CM

## 2019-11-15 DIAGNOSIS — E1121 Type 2 diabetes mellitus with diabetic nephropathy: Secondary | ICD-10-CM | POA: Diagnosis not present

## 2019-11-15 DIAGNOSIS — I5082 Biventricular heart failure: Secondary | ICD-10-CM | POA: Diagnosis present

## 2019-11-15 DIAGNOSIS — N189 Chronic kidney disease, unspecified: Secondary | ICD-10-CM | POA: Diagnosis not present

## 2019-11-15 DIAGNOSIS — I253 Aneurysm of heart: Secondary | ICD-10-CM | POA: Diagnosis present

## 2019-11-15 DIAGNOSIS — I2510 Atherosclerotic heart disease of native coronary artery without angina pectoris: Secondary | ICD-10-CM | POA: Diagnosis not present

## 2019-11-15 DIAGNOSIS — I700 Atherosclerosis of aorta: Secondary | ICD-10-CM | POA: Diagnosis present

## 2019-11-15 DIAGNOSIS — Z808 Family history of malignant neoplasm of other organs or systems: Secondary | ICD-10-CM

## 2019-11-15 DIAGNOSIS — Z8249 Family history of ischemic heart disease and other diseases of the circulatory system: Secondary | ICD-10-CM

## 2019-11-15 DIAGNOSIS — Z8673 Personal history of transient ischemic attack (TIA), and cerebral infarction without residual deficits: Secondary | ICD-10-CM | POA: Diagnosis not present

## 2019-11-15 DIAGNOSIS — I255 Ischemic cardiomyopathy: Secondary | ICD-10-CM | POA: Diagnosis present

## 2019-11-15 DIAGNOSIS — Z8674 Personal history of sudden cardiac arrest: Secondary | ICD-10-CM

## 2019-11-15 DIAGNOSIS — G4489 Other headache syndrome: Secondary | ICD-10-CM | POA: Diagnosis not present

## 2019-11-15 DIAGNOSIS — E1129 Type 2 diabetes mellitus with other diabetic kidney complication: Secondary | ICD-10-CM | POA: Diagnosis not present

## 2019-11-15 DIAGNOSIS — Z515 Encounter for palliative care: Secondary | ICD-10-CM

## 2019-11-15 DIAGNOSIS — Z806 Family history of leukemia: Secondary | ICD-10-CM

## 2019-11-15 DIAGNOSIS — D500 Iron deficiency anemia secondary to blood loss (chronic): Secondary | ICD-10-CM | POA: Diagnosis present

## 2019-11-15 DIAGNOSIS — I4891 Unspecified atrial fibrillation: Secondary | ICD-10-CM | POA: Diagnosis not present

## 2019-11-15 DIAGNOSIS — N184 Chronic kidney disease, stage 4 (severe): Secondary | ICD-10-CM | POA: Diagnosis not present

## 2019-11-15 DIAGNOSIS — N183 Chronic kidney disease, stage 3 unspecified: Secondary | ICD-10-CM | POA: Diagnosis present

## 2019-11-15 DIAGNOSIS — I50810 Right heart failure, unspecified: Secondary | ICD-10-CM | POA: Diagnosis not present

## 2019-11-15 DIAGNOSIS — I10 Essential (primary) hypertension: Secondary | ICD-10-CM | POA: Diagnosis not present

## 2019-11-15 DIAGNOSIS — M79604 Pain in right leg: Secondary | ICD-10-CM | POA: Diagnosis not present

## 2019-11-15 DIAGNOSIS — Z8719 Personal history of other diseases of the digestive system: Secondary | ICD-10-CM

## 2019-11-15 DIAGNOSIS — R0602 Shortness of breath: Secondary | ICD-10-CM | POA: Diagnosis not present

## 2019-11-15 DIAGNOSIS — R609 Edema, unspecified: Secondary | ICD-10-CM

## 2019-11-15 DIAGNOSIS — N1832 Chronic kidney disease, stage 3b: Secondary | ICD-10-CM | POA: Diagnosis not present

## 2019-11-15 DIAGNOSIS — R5381 Other malaise: Secondary | ICD-10-CM | POA: Diagnosis not present

## 2019-11-15 DIAGNOSIS — I130 Hypertensive heart and chronic kidney disease with heart failure and stage 1 through stage 4 chronic kidney disease, or unspecified chronic kidney disease: Secondary | ICD-10-CM | POA: Diagnosis present

## 2019-11-15 DIAGNOSIS — Z87891 Personal history of nicotine dependence: Secondary | ICD-10-CM

## 2019-11-15 DIAGNOSIS — I5021 Acute systolic (congestive) heart failure: Secondary | ICD-10-CM | POA: Diagnosis present

## 2019-11-15 DIAGNOSIS — Z7189 Other specified counseling: Secondary | ICD-10-CM

## 2019-11-15 DIAGNOSIS — Z7901 Long term (current) use of anticoagulants: Secondary | ICD-10-CM

## 2019-11-15 DIAGNOSIS — E1151 Type 2 diabetes mellitus with diabetic peripheral angiopathy without gangrene: Secondary | ICD-10-CM | POA: Diagnosis not present

## 2019-11-15 DIAGNOSIS — I5023 Acute on chronic systolic (congestive) heart failure: Secondary | ICD-10-CM | POA: Diagnosis present

## 2019-11-15 DIAGNOSIS — Z79899 Other long term (current) drug therapy: Secondary | ICD-10-CM

## 2019-11-15 DIAGNOSIS — K319 Disease of stomach and duodenum, unspecified: Secondary | ICD-10-CM | POA: Diagnosis present

## 2019-11-15 DIAGNOSIS — R52 Pain, unspecified: Secondary | ICD-10-CM | POA: Diagnosis not present

## 2019-11-15 DIAGNOSIS — I132 Hypertensive heart and chronic kidney disease with heart failure and with stage 5 chronic kidney disease, or end stage renal disease: Secondary | ICD-10-CM | POA: Diagnosis not present

## 2019-11-15 DIAGNOSIS — I739 Peripheral vascular disease, unspecified: Secondary | ICD-10-CM | POA: Diagnosis present

## 2019-11-15 DIAGNOSIS — I70221 Atherosclerosis of native arteries of extremities with rest pain, right leg: Secondary | ICD-10-CM | POA: Diagnosis not present

## 2019-11-15 DIAGNOSIS — E119 Type 2 diabetes mellitus without complications: Secondary | ICD-10-CM

## 2019-11-15 DIAGNOSIS — E872 Acidosis: Secondary | ICD-10-CM | POA: Diagnosis not present

## 2019-11-15 DIAGNOSIS — I340 Nonrheumatic mitral (valve) insufficiency: Secondary | ICD-10-CM | POA: Diagnosis present

## 2019-11-15 DIAGNOSIS — K5790 Diverticulosis of intestine, part unspecified, without perforation or abscess without bleeding: Secondary | ICD-10-CM | POA: Diagnosis present

## 2019-11-15 DIAGNOSIS — Z7982 Long term (current) use of aspirin: Secondary | ICD-10-CM

## 2019-11-15 DIAGNOSIS — R7989 Other specified abnormal findings of blood chemistry: Secondary | ICD-10-CM | POA: Diagnosis not present

## 2019-11-15 DIAGNOSIS — E1122 Type 2 diabetes mellitus with diabetic chronic kidney disease: Secondary | ICD-10-CM | POA: Diagnosis present

## 2019-11-15 LAB — CBC
HCT: 29.3 % — ABNORMAL LOW (ref 39.0–52.0)
Hemoglobin: 9.2 g/dL — ABNORMAL LOW (ref 13.0–17.0)
MCH: 25.5 pg — ABNORMAL LOW (ref 26.0–34.0)
MCHC: 31.4 g/dL (ref 30.0–36.0)
MCV: 81.2 fL (ref 80.0–100.0)
Platelets: 270 10*3/uL (ref 150–400)
RBC: 3.61 MIL/uL — ABNORMAL LOW (ref 4.22–5.81)
RDW: 21.2 % — ABNORMAL HIGH (ref 11.5–15.5)
WBC: 8.8 10*3/uL (ref 4.0–10.5)
nRBC: 0.6 % — ABNORMAL HIGH (ref 0.0–0.2)

## 2019-11-15 LAB — BASIC METABOLIC PANEL
Anion gap: 14 (ref 5–15)
BUN: 40 mg/dL — ABNORMAL HIGH (ref 8–23)
CO2: 22 mmol/L (ref 22–32)
Calcium: 8.6 mg/dL — ABNORMAL LOW (ref 8.9–10.3)
Chloride: 100 mmol/L (ref 98–111)
Creatinine, Ser: 2.56 mg/dL — ABNORMAL HIGH (ref 0.61–1.24)
GFR calc Af Amer: 27 mL/min — ABNORMAL LOW (ref >60)
GFR calc non Af Amer: 23 mL/min — ABNORMAL LOW (ref >60)
Glucose, Bld: 91 mg/dL (ref 70–99)
Potassium: 3.9 mmol/L (ref 3.5–5.1)
Sodium: 136 mmol/L (ref 135–145)

## 2019-11-15 LAB — BRAIN NATRIURETIC PEPTIDE: B Natriuretic Peptide: 2374.7 pg/mL — ABNORMAL HIGH (ref 0.0–100.0)

## 2019-11-15 LAB — SARS CORONAVIRUS 2 (TAT 6-24 HRS): SARS Coronavirus 2: NEGATIVE

## 2019-11-15 LAB — TROPONIN I (HIGH SENSITIVITY)
Troponin I (High Sensitivity): 7 ng/L (ref <18)
Troponin I (High Sensitivity): 8 ng/L (ref <18)

## 2019-11-15 LAB — MAGNESIUM: Magnesium: 2.1 mg/dL (ref 1.7–2.4)

## 2019-11-15 MED ORDER — AEROCHAMBER PLUS FLO-VU LARGE MISC
1.0000 | Freq: Once | Status: AC
  Administered 2019-11-15: 1

## 2019-11-15 MED ORDER — ALBUTEROL SULFATE HFA 108 (90 BASE) MCG/ACT IN AERS
2.0000 | INHALATION_SPRAY | Freq: Once | RESPIRATORY_TRACT | Status: AC
  Administered 2019-11-15: 2 via RESPIRATORY_TRACT
  Filled 2019-11-15: qty 6.7

## 2019-11-15 MED ORDER — HEPARIN BOLUS VIA INFUSION
4000.0000 [IU] | Freq: Once | INTRAVENOUS | Status: AC
  Administered 2019-11-15: 4000 [IU] via INTRAVENOUS
  Filled 2019-11-15: qty 4000

## 2019-11-15 MED ORDER — SODIUM CHLORIDE 0.9% FLUSH
3.0000 mL | INTRAVENOUS | Status: DC | PRN
  Administered 2019-11-19: 3 mL via INTRAVENOUS

## 2019-11-15 MED ORDER — TORSEMIDE 20 MG PO TABS: 60.0000 mg | ORAL_TABLET | Freq: Two times a day (BID) | ORAL | 0 refills | Status: DC

## 2019-11-15 MED ORDER — SODIUM CHLORIDE 0.9 % IV SOLN: 250.0000 mL | INTRAVENOUS | Status: DC | PRN

## 2019-11-15 MED ORDER — ALBUTEROL SULFATE HFA 108 (90 BASE) MCG/ACT IN AERS
2.0000 | INHALATION_SPRAY | Freq: Four times a day (QID) | RESPIRATORY_TRACT | Status: DC | PRN
  Administered 2019-11-15 – 2019-11-20 (×4): 2 via RESPIRATORY_TRACT
  Filled 2019-11-15: qty 6.7

## 2019-11-15 MED ORDER — APIXABAN 5 MG PO TABS
5.0000 mg | ORAL_TABLET | Freq: Two times a day (BID) | ORAL | Status: DC
  Filled 2019-11-15: qty 1

## 2019-11-15 MED ORDER — MOMETASONE FURO-FORMOTEROL FUM 200-5 MCG/ACT IN AERO
2.0000 | INHALATION_SPRAY | Freq: Two times a day (BID) | RESPIRATORY_TRACT | Status: DC
  Administered 2019-11-15 – 2019-11-21 (×11): 2 via RESPIRATORY_TRACT
  Filled 2019-11-15: qty 8.8

## 2019-11-15 MED ORDER — HEPARIN (PORCINE) 25000 UT/250ML-% IV SOLN
1350.0000 [IU]/h | INTRAVENOUS | Status: DC
  Administered 2019-11-15 – 2019-11-16 (×2): 1350 [IU]/h via INTRAVENOUS
  Administered 2019-11-17: 1100 [IU]/h via INTRAVENOUS
  Administered 2019-11-18: 950 [IU]/h via INTRAVENOUS
  Administered 2019-11-19: 1050 [IU]/h via INTRAVENOUS
  Administered 2019-11-20: 1450 [IU]/h via INTRAVENOUS
  Filled 2019-11-15 (×7): qty 250

## 2019-11-15 MED ORDER — SODIUM CHLORIDE 0.9% FLUSH
3.0000 mL | Freq: Two times a day (BID) | INTRAVENOUS | Status: DC
  Administered 2019-11-15 – 2019-11-21 (×8): 3 mL via INTRAVENOUS

## 2019-11-15 MED ORDER — ISOSORBIDE MONONITRATE ER 30 MG PO TB24: 30.0000 mg | ORAL_TABLET | Freq: Every day | ORAL | Status: DC

## 2019-11-15 MED ORDER — ASPIRIN EC 81 MG PO TBEC
81.0000 mg | DELAYED_RELEASE_TABLET | Freq: Every day | ORAL | Status: DC
  Administered 2019-11-16: 81 mg via ORAL
  Filled 2019-11-15 (×2): qty 1

## 2019-11-15 MED ORDER — ATORVASTATIN CALCIUM 80 MG PO TABS
80.0000 mg | ORAL_TABLET | Freq: Every day | ORAL | Status: DC
  Administered 2019-11-16 – 2019-11-21 (×6): 80 mg via ORAL
  Filled 2019-11-15 (×6): qty 1

## 2019-11-15 MED ORDER — ACETAMINOPHEN 325 MG PO TABS
650.0000 mg | ORAL_TABLET | ORAL | Status: DC | PRN
  Filled 2019-11-15 (×2): qty 2

## 2019-11-15 MED ORDER — PANTOPRAZOLE SODIUM 40 MG PO TBEC
40.0000 mg | DELAYED_RELEASE_TABLET | Freq: Two times a day (BID) | ORAL | Status: DC
  Administered 2019-11-15 – 2019-11-21 (×12): 40 mg via ORAL
  Filled 2019-11-15 (×12): qty 1

## 2019-11-15 MED ORDER — ONDANSETRON HCL 4 MG/2ML IJ SOLN: 4.0000 mg | Freq: Four times a day (QID) | INTRAMUSCULAR | Status: DC | PRN

## 2019-11-15 MED ORDER — FENTANYL CITRATE (PF) 100 MCG/2ML IJ SOLN
25.0000 ug | INTRAMUSCULAR | Status: DC | PRN
  Administered 2019-11-16 – 2019-11-20 (×3): 25 ug via INTRAVENOUS
  Filled 2019-11-15 (×5): qty 2

## 2019-11-15 MED ORDER — POTASSIUM CHLORIDE CRYS ER 20 MEQ PO TBCR
20.0000 meq | EXTENDED_RELEASE_TABLET | Freq: Two times a day (BID) | ORAL | Status: DC
  Administered 2019-11-15 – 2019-11-21 (×12): 20 meq via ORAL
  Filled 2019-11-15 (×12): qty 1

## 2019-11-15 MED ORDER — TORSEMIDE 20 MG PO TABS
60.0000 mg | ORAL_TABLET | Freq: Two times a day (BID) | ORAL | Status: DC
  Administered 2019-11-15 – 2019-11-16 (×2): 60 mg via ORAL
  Filled 2019-11-15 (×3): qty 3

## 2019-11-15 MED ORDER — HYDRALAZINE HCL 10 MG PO TABS
10.0000 mg | ORAL_TABLET | Freq: Two times a day (BID) | ORAL | Status: DC
  Administered 2019-11-15: 10 mg via ORAL
  Filled 2019-11-15 (×2): qty 1

## 2019-11-15 MED ORDER — METOPROLOL SUCCINATE ER 50 MG PO TB24: 150.0000 mg | ORAL_TABLET | Freq: Every day | ORAL | Status: DC

## 2019-11-15 NOTE — Consult Note (Addendum)
[facility] NOTE   MRN : [id number]  Reason for Consult: Right LE pain and numbness Referring Physician: Dr. Lorin Mercy  History of Present Illness: 79 y/o male recently seen by Dr. Scot Dock on 11/05/19.    This patient has previously undergone arteriography by Dr. Quay Burow.  He has a known right external iliac artery occlusion with severe infrainguinal arterial occlusive disease on the right and peroneal runoff only.  On the left side he has a superficial femoral artery occlusion with diffuse infrainguinal arterial occlusive disease.  He also has a known small 3 cm abdominal aortic aneurysm based on his duplex on 03/15/2019.   On his previous admission his right LE pain got better and he was discharged with a f/u in Dr. Jenness Corner office who has followed his PAD in the past.  He returns with increased right foot pain, numbness, and left thigh edema over the past few days.  He denise non healing ulcers.  Past medical history includes: A fib managed with Eliquis, CKD, DM, CVA, HLD, CHF and SOB.   Current Facility-Administered Medications  Medication Dose Route Frequency Provider Last Rate Last Admin  . 0.9 %  sodium chloride infusion  250 mL Intravenous PRN Karmen Bongo, MD      . acetaminophen (TYLENOL) tablet 650 mg  650 mg Oral Q4H PRN Karmen Bongo, MD      . albuterol (VENTOLIN HFA) 108 (90 Base) MCG/ACT inhaler 2 puff  2 puff Inhalation Q6H PRN Karmen Bongo, MD      . apixaban Ar[facility]) tablet 5 mg  5 mg Oral BID Karmen Bongo, MD      . Derrill Memo ON 11/16/2019] aspirin EC tablet 81 mg  81 mg Oral Daily Karmen Bongo, MD      . Derrill Memo ON 11/16/2019] atorvastatin (LIPITOR) tablet 80 mg  80 mg Oral Daily Karmen Bongo, MD      . fentaNYL (SUBLIMAZE) injection 25 mcg  25 mcg Intravenous Q2H PRN Karmen Bongo, MD      . hydrALAZINE (APRESOLINE) tablet 10 mg  10 mg Oral BID Karmen Bongo, MD      . Derrill Memo ON 11/16/2019] isosorbide  mononitrate (IMDUR) 24 hr tablet 30 mg  30 mg Oral Daily Karmen Bongo, MD      . Derrill Memo ON 11/16/2019] metoprolol succinate (TOPROL-XL) 24 hr tablet 150 mg  150 mg Oral Daily Karmen Bongo, MD      . mometasone-formoterol [facility]) 200-5 MCG/ACT inhaler 2 puff  2 puff Inhalation BID Karmen Bongo, MD      . ondansetron [facility]) injection 4 mg  4 mg Intravenous Q6H PRN Karmen Bongo, MD      . pantoprazole (PROTONIX) EC tablet 40 mg  40 mg Oral BID Karmen Bongo, MD      . potassium chloride SA (KLOR-CON) CR tablet 20 mEq  20 mEq Oral BID Karmen Bongo, MD      . sodium chloride flush (NS) 0.9 % injection 3 mL  3 mL Intravenous Q12H Karmen Bongo, MD      . sodium chloride flush (NS) 0.9 % injection 3 mL  3 mL Intravenous PRN Karmen Bongo, MD      . torsemide [facility]) tablet 60 mg  60 mg Oral BID Karmen Bongo, MD       Current Outpatient Medications  Medication Sig Dispense Refill  . acetaminophen (TYLENOL) 325 MG tablet Take 650 mg by mouth every 6 (six) hours as needed for headache.    Marland Kitchen  albuterol (VENTOLIN HFA) 108 (90 Base) MCG/ACT inhaler Inhale 2 puffs into the lungs every 6 (six) hours as needed for wheezing or shortness of breath. 90 g 3  . apixaban (ELIQUIS) 5 MG TABS tablet Take 1 tablet (5 mg total) by mouth 2 (two) times daily. 60 tablet 0  . atorvastatin (LIPITOR) 80 MG tablet Take 80 mg by mouth daily.    . Ensure (ENSURE) Take 237 mLs by mouth 2 (two) times daily as needed (nutritional supplement).    . ferrous sulfate 325 (65 FE) MG tablet Take 1 tablet (325 mg total) by mouth 2 (two) times daily with a meal. 60 tablet 3  . aspirin EC 81 MG EC tablet Take 1 tablet (81 mg total) by mouth daily. (Patient not taking: Reported on 11/15/2019) 60 tablet 0  . Fluticasone-Salmeterol (ADVAIR DISKUS) 250-50 MCG/DOSE AEPB Inhale 1 puff into the lungs 2 (two) times daily for 14 days. (Patient taking differently: Inhale 1 puff into the lungs 2 (two) times daily as needed  (shortness of breath). ) 28 each 0  . hydrALAZINE (APRESOLINE) 10 MG tablet Take 1 tablet (10 mg total) by mouth 2 (two) times daily. 180 tablet 0  . isosorbide mononitrate (IMDUR) 30 MG 24 hr tablet Take 1 tablet (30 mg total) by mouth daily. 30 tablet 1  . metoprolol succinate (TOPROL-XL) 50 MG 24 hr tablet Take 3 tablets (150 mg total) by mouth daily. Take with or immediately following a meal. 180 tablet 0  . nitroGLYCERIN (NITROSTAT) 0.4 MG SL tablet Place 1 tablet (0.4 mg total) under the tongue every 5 (five) minutes as needed for chest pain (Up to 3 doses). 25 tablet 4  . pantoprazole (PROTONIX) 40 MG tablet Take 1 tablet (40 mg total) by mouth 2 (two) times daily. 60 tablet 0  . potassium chloride SA (KLOR-CON) 20 MEQ tablet Take 1 tablet (20 mEq total) by mouth 2 (two) times daily. 120 tablet 0  . torsemide (DEMADEX) 20 MG tablet Take 3 tablets (60 mg total) by mouth 2 (two) times daily. 360 tablet 0    Pt meds include: Statin :Yes Betablocker: Yes ASA: Yes Other anticoagulants/antiplatelets: Eliquis  Past Medical History:  Diagnosis Date  . AAA (abdominal aortic aneurysm) ([place])    a. 08/2015 Abd U/S: 2.7 x 2.9 x 3.2 cm infrarenal AAA.  . Bradycardia    a. Requiring discontinuation of use of BB/CCB  . Cardiac arrest - ventricular fibrillation    a. 03/2012 in setting of hypokalemia (prolonged hosp with VDRF, tracheobronchitis, ARF, shock liver, PAF, AMS felt secondary to post-anoxic encephalopathy/shock).  . Carotid artery stenosis    a. 01/2011 - 40-59% bilateral stenosis;  b. 06/2015 Carotid U/S: 40-59% bilat ICA stenosis->f/u 6 mos.  . CKD (chronic kidney disease), stage III   . Coronary artery disease    a. s/p aborted ant STEMI tx with Cypher DES to LAD 10/04 (residual at cath: D1 50%, CFX 40% and multiple dist 70%, EF 55%);   b. myoview 3/10: Ef 47%, infero-apical isch, LOW RISK - med Tx recommended;  c. 12/2012 VF Arrest/Cath: LAD 40isr, 80 apical, LCX 100 (failed PTCA), RCA  nondom.  . Diabetes mellitus   . Diverticulosis 2001  . Elevated LFTs    Shock liver 03/2012  . H/O: CVA (cardiovascular accident)   . Hematemesis    a. 12/2010 felt 2/2 Mallory Weiss tear - pt could not afford colonscopy/EGD at that time so Coumadin was deferred. Coumadin initiated 03/2012 without any  evidence for bleeding.  Marland Kitchen History of Ischemic Cardiomyopathy    a. EF 50-55% by echo 03/09/12 (was 30% by echo 02/24/12); b. 03/2013 Echo: EF 55-60%, mild MR, sev dil LA, mod dil RA, PASP 46mmHg.  Marland Kitchen Hypertensive heart disease    a. echo 4/12: EF 50%, asymmetric septal hypertrophy, no SAM or LVOT gradient, LAE, PASP 35  . Inguinal hernia   . 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 Mitral regurgitation    a. Mild by echo 03/2012 and 03/2013.  . Mixed Hyperlipidemia   . Peripheral vascular disease ([place])    a. h/o LE angioplasty;  b. 08/2015 Duplex: >50% RCIA, 100% REIA, >50% LEIA, elev vel in SMA, patent IVC.  Marland Kitchen Persistent atrial fibrillation ([place])    a. Dx 12/2010-->coumadin (CHA2DS2VASc = 7).  . QT prolongation     Past Surgical History:  Procedure Laterality Date  . BIOPSY  11/07/2019   Procedure: BIOPSY;  Surgeon: [name], MD;  Location: [place];  Service: Endoscopy;;  . COLONOSCOPY WITH PROPOFOL N/A 11/07/2019   Procedure: COLONOSCOPY WITH PROPOFOL;  Surgeon: [name], MD;  Location[facility] ENDOSCOPY;  Service: Endoscopy;  Laterality: N/A;  . ESOPHAGOGASTRODUODENOSCOPY (EGD) WITH PROPOFOL N/A 11/07/2019   Procedure: ESOPHAGOGASTRODUODENOSCOPY (EGD) WITH PROPOFOL;  Surgeon: [name], MD;  Location: [facility] ENDOSCOPY;  Service: Endoscopy;  Laterality: N/A;  . HEMOSTASIS CLIP PLACEMENT  11/07/2019   Procedure: HEMOSTASIS CLIP PLACEMENT;  Surgeon: [name], MD;  Location: [facility] ENDOSCOPY;  Service: Endoscopy;;  . HERNIA REPAIR    . LEFT HEART CATH N/A 12/30/2012   Procedure: LEFT HEART CATH;  Surgeon: [name], MD;  Location: [facility] CATH  LAB;  Service: Cardiovascular;  Laterality: N/A;  . ORIF TIBIA & FIBULA FRACTURES  01/11/07   OPEN TX OF UNICONDYLAR PLATEAU FRACTURE, IRRIGATION/DEBRIDEMENT OF OPEN FRACTURE INCLUDING BONE, REMOVAL OF EXTERNAL FIXATOR UNDER ANESTHESIA PER DR. MICHAEL HANDY  . PERIPHERAL VASCULAR CATHETERIZATION N/A 12/21/2015   Procedure: Lower Extremity Angiography;  Surgeon: [name], MD;  Location: [place] CV LAB;  Service: Cardiovascular;  Laterality: N/A;  . POLYPECTOMY  11/07/2019   Procedure: POLYPECTOMY;  Surgeon: [name], MD;  Location: [place];  Service: Endoscopy;;  . POPLITEAL ARTERY ANGIOPLASTY  01/07/07   s/p LEFT POPLITEAL ARTERY EXPLORATION AND VEIN PATCH ANGIOPLASTY, PER DR. EARLY, SECONDARY TO ISCHEMIC LEFT FOOT RELATED TO LEFT POPLITEAL ARTERY INJURY  . RIGHT HEART CATH N/A 10/02/2019   Procedure: RIGHT HEART CATH;  Surgeon: [name], MD;  Location: [place] CV LAB;  Service: Cardiovascular;  Laterality: N/A;  . SKIN GRAFT    . TEE WITHOUT CARDIOVERSION N/A 08/08/2019   Procedure: TRANSESOPHAGEAL ECHOCARDIOGRAM (TEE);  Surgeon: Lelon Perla, MD;  Location: [facility] ENDOSCOPY;  Service: Cardiovascular;  Laterality: N/A;    Social History Social History   Tobacco Use  . Smoking status: Former Smoker    Packs/day: 0.50    Years: 30.00    Pack years: 15.00    Types: Cigarettes    Quit date: 03/12/2012    Years since quitting: 7.6  . Smokeless tobacco: Never Used  Substance Use Topics  . Alcohol use: No    Alcohol/week: 0.0 standard drinks  . Drug use: No    Frequency: 2.0 times per week    Family History Family History  Problem Relation Age of Onset  . Heart disease Father        ALSO UNCLE DIED HAD CAD  .  Heart failure Father   . Brain cancer Mother   . Leukemia Mother   . Sickle cell anemia Mother   . Sickle cell anemia Other     No Known Allergies   REVIEW OF SYSTEMS  General: [ ]  Weight loss, [ ]  Fever, [ ]  chills Neurologic: [ ]   Dizziness, [ ]  Blackouts, [ ]  Seizure [ ]  Stroke, [ ]  "Mini stroke", [ ]  Slurred speech, [ ]  Temporary blindness; [ ]  weakness in arms or legs, [ ]  Hoarseness [ ]  Dysphagia Cardiac: [ ]  Chest pain/pressure, [x ] Shortness of breath at rest [ ]  Shortness of breath with exertion, [ ]  Atrial fibrillation or irregular heartbeat  Vascular: [ ]  Pain in legs with walking, [x ] Pain in legs at rest, [x ] Pain in legs at night,  [ ]  Non-healing ulcer, [ ]  Blood clot in vein/DVT,   Pulmonary: [ ]  Home oxygen, [ ]  Productive cough, [ ]  Coughing up blood, [ ]  Asthma,  [ ]  Wheezing [x ] COPD Musculoskeletal:  [ ]  Arthritis, [ ]  Low back pain, [ ]  Joint pain Hematologic: [ ]  Easy Bruising, [ ]  Anemia; [ ]  Hepatitis Gastrointestinal: [ ]  Blood in stool, [ ]  Gastroesophageal Reflux/heartburn, Urinary: [x ] chronic Kidney disease, [ ]  on HD - [ ]  MWF or [ ]  TTHS, [ ]  Burning with urination, [ ]  Difficulty urinating Skin: [ ]  Rashes, [ ]  Wounds Psychological: [ ]  Anxiety, [ ]  Depression  Physical Examination Vitals:   11/15/19 1230 11/15/19 1245 11/15/19 1300 11/15/19 1315  BP: (!) 112/45 115/69 (!) 100/49 115/65  Pulse:    64  Resp: 12 15 (!) 27 20  Temp:      TempSrc:      SpO2:    100%  Weight:      Height:       Body mass index is 22.87 kg/m.  General:  WDWN in NAD HENT: WNL Eyes: Pupils equal Pulmonary: normal non-labored breathing , without Rales, rhonchi,  wheezing Cardiac: irregularly irregular, without  Murmurs, rubs or gallops; No carotid bruits Abdomen: soft, NT, no masses Skin: no rashes, ulcers noted;  no Gangrene , no cellulitis; no open wounds;   Vascular Exam/Pulses:palpable radial pulses, left femoral pulse palpable.  The feet have intact active range of motion.     Musculoskeletal: no muscle wasting or atrophy; left thigh> right edema soft to palpation.   Neurologic: A&O X 3; Appropriate Affect ;  SENSATION: normal; MOTOR FUNCTION: all 4 ext motor grossly intact Speech is  fluent/normal   Significant Diagnostic Studies: CBC Lab Results  Component Value Date   WBC 8.8 11/15/2019   HGB 9.2 (L) 11/15/2019   HCT 29.3 (L) 11/15/2019   MCV 81.2 11/15/2019   PLT 270 11/15/2019    BMET    Component Value Date/Time   NA 136 11/15/2019 1110   NA 139 10/29/2019 1139   K 3.9 11/15/2019 1110   CL 100 11/15/2019 1110   CO2 22 11/15/2019 1110   GLUCOSE 91 11/15/2019 1110   BUN 40 (H) 11/15/2019 1110   BUN 37 (H) 10/29/2019 1139   CREATININE 2.56 (H) 11/15/2019 1110   CREATININE 1.34 (H) 10/31/2016 0844   CALCIUM 8.6 (L) 11/15/2019 1110   GFRNONAA 23 (L) 11/15/2019 1110   GFRNONAA 46 (L) 10/24/2014 0943   GFRAA 27 (L) 11/15/2019 1110   GFRAA 53 (L) 10/24/2014 0943   Estimated Creatinine Clearance: 28.8 mL/min (A) (by C-G formula based on SCr  of 2.56 mg/dL (H)).  COAG Lab Results  Component Value Date   INR 2.5 (H) 09/30/2019   INR 1.5 (H) 06/22/2019   INR 1.1 (A) 05/04/2018     Non-Invasive Vascular Imaging:  ABI Findings:  +---------+------------------+-----+---------+--------+  Right  Rt Pressure (mmHg)IndexWaveform Comment   +---------+------------------+-----+---------+--------+  Brachial 120           triphasic      +---------+------------------+-----+---------+--------+  PTA               absent        +---------+------------------+-----+---------+--------+  DP                absent        +---------+------------------+-----+---------+--------+  Great Toe            Absent        +---------+------------------+-----+---------+--------+   +---------+------------------+-----+---------+---------------+  Left   Lt Pressure (mmHg)IndexWaveform Comment      +---------+------------------+-----+---------+---------------+  Brachial 122           triphasic           +---------+------------------+-----+---------+---------------+  PTA   43        0.35      possibly venous  +---------+------------------+-----+---------+---------------+  DP                     possibly venous  +---------+------------------+-----+---------+---------------+  Great Toe            Absent            +---------+------------------+-----+---------+---------------+       11/05/2019 Summary:  Right:   Unable to innsonate PTA or DP due to low velocity vs nonexistant  waveforms.  Left: Resting left ankle-brachial index indicates severe left lower  extremity arterial disease.   Due to irregularity of waveforms unable to distingusihed between arterial  vs venous flow.     Venous duplex 11/15/19  +---------+---------------+---------+-----------+----------+--------------+   RIGHT  CompressibilityPhasicitySpontaneityPropertiesThrombus  Aging  +---------+---------------+---------+-----------+----------+--------------+   CFV   Full      Yes   Yes                    +---------+---------------+---------+-----------+----------+--------------+   SFJ   Full                                 +---------+---------------+---------+-----------+----------+--------------+   FV Prox Full                                 +---------+---------------+---------+-----------+----------+--------------+   FV Mid  Full                                 +---------+---------------+---------+-----------+----------+--------------+   FV DistalFull                                 +---------+---------------+---------+-----------+----------+--------------+   PFV   Full                                  +---------+---------------+---------+-----------+----------+--------------+   POP   Full      Yes   Yes                    +---------+---------------+---------+-----------+----------+--------------+  PTV   Full                                 +---------+---------------+---------+-----------+----------+--------------+   PERO   Full                                 +---------+---------------+---------+-----------+----------+--------------+          +---------+---------------+---------+-----------+----------+--------------+   LEFT   CompressibilityPhasicitySpontaneityPropertiesThrombus  Aging  +---------+---------------+---------+-----------+----------+--------------+   CFV   Full      Yes   Yes                    +---------+---------------+---------+-----------+----------+--------------+   SFJ   Full                                 +---------+---------------+---------+-----------+----------+--------------+   FV Prox Full                                 +---------+---------------+---------+-----------+----------+--------------+   FV Mid  Full                                 +---------+---------------+---------+-----------+----------+--------------+   FV DistalFull                                 +---------+---------------+---------+-----------+----------+--------------+   PFV   Full                                 +---------+---------------+---------+-----------+----------+--------------+   POP   Full      Yes   Yes                    +---------+---------------+---------+-----------+----------+--------------+    PTV   Full                                 +---------+---------------+---------+-----------+----------+--------------+   PERO   Full                                 +---------+---------------+---------+-----------+----------+--------------+   Summary:  RIGHT:  - There is no evidence of deep vein thrombosis in the lower extremity.    - No cystic structure found in the popliteal fossa.  - Ultrasound characteristics of enlarged lymph nodes are noted in the  groin.    LEFT:  - There is no evidence of deep vein thrombosis in the lower extremity.    - No cystic structure found in the popliteal fossa.    Pulstaile venous flow is noted bilaterally, suggestive of possible  elevated right heart pressure.    ASSESSMENT/PLAN:  Chronic PAD with right LE increased pain and numbness  He has a known right external iliac artery occlusion with severe infrainguinal arterial occlusive disease on the right and peroneal runoff only.  On the left side he has a superficial femoral artery occlusion with diffuse infrainguinal arterial occlusive disease.    His last angiogram was 2017 by Dr. Gwenlyn Found without intervention.  We will  schedule him for aorta angiogram likely Monday.  He will then be scheduled for further open intervention to improve inflow to the right LE.   His Eliquis should be held and we recommend hydration secondary to CKD prior to angiogram.     Roxy Horseman 11/15/2019 3:11 PM   I agree with the above.  I have seen and evaluated patient.  Briefly this is a 79 year old gentleman with multiple medical issues who came then several weeks ago with right leg pain which resolved.  He has been angiogramed in 2017 by Dr. Alvester Chou who found an occluded right external iliac and common femoral artery.  He appears to have progressed his ischemic changes and now is having pain and numbness in his foot.  This has been going  on for approximately 1 week.  I think he is now in the category of limb threatening ischemia.  He will need angiography to redefine his anatomy.  He will be admitted to the hospital service over the weekend for hydration to optimize his chronic renal insufficiency, and I will plan for angiogram next week and hopeful surgical revascularization.  Annamarie Major

## 2019-11-15 NOTE — H&P (Signed)
History and Physical    Logan White TWS:[id number] DOB: Mar 17, 1941 DOA: 11/15/2019  PCP: Patient, No Pcp Per Consultants:  [name] - cardiology Patient coming from:  Home - lives with Logan; NOK: Logan, Kamoni White, [phone]  Chief Complaint: B leg pain  HPI: Logan White is a 79 y.o. male with medical history significant of afib on Coumadin; PVD; HLD; h/o CVA; DM; CAD s/p stent; stage 4 CKD; and AAA presenting with B leg pain.  He reports that he is here due to his R > L legs - pain, they go numb on him.  He reports that no one has told him anything about his legs, just tried to get the fluid down on previous visits.  He has increased LE a couple of days ago.  He is SOB.  Since he left the hospital last Thursday, he had no problems until yesterday.  His R leg went numb and then the feeling came back and he went back inside.  He woke up this AM and his legs were going numb on him.  He tried getting dressed this AM and he just couldn't do it because of leg numbness.  The SOB is a secondary issue.  No orthopnea.  He got extra fluid pills from Dr. [name] and he started peeing more yesterday.  He was last hospitalized from 3/1-5 with RLE weakness and pain as well as acute on chronic systolic CHF and severe anemia thought to be related to ABLA from GI bleeding.   He has known LE PAD that is not percutaneously addressable.   I spoke with his Logan.  She reports that this is similar to what he was experiencing with his last hospitalization.  She agrees that this needs to be addressed.  ED Course:  Recently admitted for anemia/GIB.  Returns with SOB, due for arterial US as an outpatient.  Appears volume overloaded, CXR with pulm edema.  Dr. [name] has recommended increasing diuretic x 2 days.  Also with worsened creatinine elevation.  Hgb stable.  Vascular saw at last admission, appears to be unchanged.  Review of Systems: As per HPI; otherwise review of systems reviewed and negative.    Ambulatory Status:  Ambulates with a cane or walker  Past Medical History:  Diagnosis Date  . AAA (abdominal aortic aneurysm) ([place])    a. 08/2015 Abd U/S: 2.7 x 2.9 x 3.2 cm infrarenal AAA.  . Bradycardia    a. Requiring discontinuation of use of BB/CCB  . Cardiac arrest - ventricular fibrillation    a. 03/2012 in setting of hypokalemia (prolonged hosp with VDRF, tracheobronchitis, ARF, shock liver, PAF, AMS felt secondary to post-anoxic encephalopathy/shock).  . Carotid artery stenosis    a. 01/2011 - 40-59% bilateral stenosis;  b. 06/2015 Carotid U/S: 40-59% bilat ICA stenosis->f/u 6 mos.  . CKD (chronic kidney disease), stage III   . Coronary artery disease    a. s/p aborted ant STEMI tx with Cypher DES to LAD 10/04 (residual at cath: D1 50%, CFX 40% and multiple dist 70%, EF 55%);   b. myoview 3/10: Ef 47%, infero-apical isch, LOW RISK - med Tx recommended;  c. 12/2012 VF Arrest/Cath: LAD 40isr, 80 apical, LCX 100 (failed PTCA), RCA nondom.  . Diabetes mellitus   . Diverticulosis 2001  . Elevated LFTs    Shock liver 03/2012  . H/O: CVA (cardiovascular accident)   . Hematemesis    a. 12/2010 felt 2/2 Mallory Weiss tear - pt could not afford colonscopy/EGD at  that time so Coumadin was deferred. Coumadin initiated 03/2012 without any evidence for bleeding.  Marland Kitchen History of Ischemic Cardiomyopathy    a. EF 50-55% by echo 03/09/12 (was 30% by echo 02/24/12); b. 03/2013 Echo: EF 55-60%, mild MR, sev dil LA, mod dil RA, PASP 6mmHg.  Marland Kitchen Hypertensive heart disease    a. echo 4/12: EF 50%, asymmetric septal hypertrophy, no SAM or LVOT gradient, LAE, PASP 35  . Inguinal hernia   . 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 Mitral regurgitation    a. Mild by echo 03/2012 and 03/2013.  . Mixed Hyperlipidemia   . Peripheral vascular disease ([place])    a. h/o LE angioplasty;  b. 08/2015 Duplex: >50% RCIA, 100% REIA, >50% LEIA, elev vel in SMA, patent IVC.  Marland Kitchen Persistent atrial  fibrillation ([place])    a. Dx 12/2010-->coumadin (CHA2DS2VASc = 7).  . QT prolongation     Past Surgical History:  Procedure Laterality Date  . BIOPSY  11/07/2019   Procedure: BIOPSY;  Surgeon: [name], MD;  Location: [facility];  Service: Endoscopy;;  . COLONOSCOPY WITH PROPOFOL N/A 11/07/2019   Procedure: COLONOSCOPY WITH PROPOFOL;  Surgeon: [name], MD;  Location: [facility] ENDOSCOPY;  Service: Endoscopy;  Laterality: N/A;  . ESOPHAGOGASTRODUODENOSCOPY (EGD) WITH PROPOFOL N/A 11/07/2019   Procedure: ESOPHAGOGASTRODUODENOSCOPY (EGD) WITH PROPOFOL;  Surgeon: [name], MD;  Location: [facility] ENDOSCOPY;  Service: Endoscopy;  Laterality: N/A;  . HEMOSTASIS CLIP PLACEMENT  11/07/2019   Procedure: HEMOSTASIS CLIP PLACEMENT;  Surgeon: [name], MD;  Location: [facility] ENDOSCOPY;  Service: Endoscopy;;  . HERNIA REPAIR    . LEFT HEART CATH N/A 12/30/2012   Procedure: LEFT HEART CATH;  Surgeon: [name], MD;  Location: [facility] CATH LAB;  Service: Cardiovascular;  Laterality: N/A;  . ORIF TIBIA & FIBULA FRACTURES  01/11/07   OPEN TX OF UNICONDYLAR PLATEAU FRACTURE, IRRIGATION/DEBRIDEMENT OF OPEN FRACTURE INCLUDING BONE, REMOVAL OF EXTERNAL FIXATOR UNDER ANESTHESIA PER DR. MICHAEL HANDY  . PERIPHERAL VASCULAR CATHETERIZATION N/A 12/21/2015   Procedure: Lower Extremity Angiography;  Surgeon: [name], MD;  Location: [facility] CV LAB;  Service: Cardiovascular;  Laterality: N/A;  . POLYPECTOMY  11/07/2019   Procedure: POLYPECTOMY;  Surgeon: [name], MD;  Location: [facility];  Service: Endoscopy;;  . POPLITEAL ARTERY ANGIOPLASTY  01/07/07   s/p LEFT POPLITEAL ARTERY EXPLORATION AND VEIN PATCH ANGIOPLASTY, PER DR. EARLY, SECONDARY TO ISCHEMIC LEFT FOOT RELATED TO LEFT POPLITEAL ARTERY INJURY  . RIGHT HEART CATH N/A 10/02/2019   Procedure: RIGHT HEART CATH;  Surgeon: [name], MD;  Location: [facility] CV LAB;  Service: Cardiovascular;  Laterality: N/A;  . SKIN GRAFT    .  TEE WITHOUT CARDIOVERSION N/A 08/08/2019   Procedure: TRANSESOPHAGEAL ECHOCARDIOGRAM (TEE);  Surgeon: Lelon Perla, MD;  Location: [facility] ENDOSCOPY;  Service: Cardiovascular;  Laterality: N/A;    Social History   Socioeconomic History  . Marital status: Married    Spouse name: Not on file  . Number of children: 4  . Years of education: Not on file  . Highest education level: Not on file  Occupational History  . 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 Smoking status: Former Smoker    Packs/day: 0.50    Years: 30.00    Pack years: 15.00    Types: Cigarettes    Quit date: 03/12/2012    Years since quitting: 7.6  .  Smokeless tobacco: Never Used  Substance and Sexual Activity  . Alcohol use: No    Alcohol/week: 0.0 standard drinks  . Drug use: No    Frequency: 2.0 times per week  . Sexual activity: Not on file  Other Topics Concern  . 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 YES. 1/2 -1 PPD OF CIGARETTES    NO ETOH   Social Determinants of Health   Financial Resource Strain:   . Difficulty of Paying Living Expenses:   Food Insecurity:   . Worried About Charity fundraiser in the Last Year:   . Arboriculturist in the Last Year:   Transportation Needs:   . Film/video editor (Medical):   Marland Kitchen Lack of Transportation (Non-Medical):   Physical Activity:   . Days of Exercise per Week:   . Minutes of Exercise per Session:   Stress:   . Feeling of Stress :   Social Connections:   . Frequency of Communication with Friends and Family:   . Frequency of Social Gatherings with Friends and Family:   . Attends Religious Services:   . Active Member of Clubs or Organizations:   . Attends Archivist Meetings:   Marland Kitchen Marital Status:   Intimate Partner Violence:   . Fear of Current or Ex-Partner:   . Emotionally Abused:   Marland Kitchen  Physically Abused:   . Sexually Abused:     No Known Allergies  Family History  Problem Relation Age of Onset  . Heart disease Father        ALSO UNCLE DIED HAD CAD  . Heart failure Father   . Brain cancer Mother   . Leukemia Mother   . Sickle cell anemia Mother   . Sickle cell anemia Other     Prior to Admission medications   Medication Sig Start Date End Date Taking? Authorizing Provider  acetaminophen (TYLENOL) 325 MG tablet Take 650 mg by mouth every 6 (six) hours as needed for headache.    [provider]  albuterol (VENTOLIN HFA) 108 (90 Base) MCG/ACT inhaler Inhale 2 puffs into the lungs every 6 (six) hours as needed for wheezing or shortness of breath. 08/26/19   Lelon Perla, MD  apixaban (ELIQUIS) 5 MG TABS tablet Take 1 tablet (5 mg total) by mouth 2 (two) times daily. 11/14/19   Domenic Polite, MD  aspirin EC 81 MG EC tablet Take 1 tablet (81 mg total) by mouth daily. 10/07/19 12/06/19  Kayleen Memos, DO  atorvastatin (LIPITOR) 80 MG tablet Take 80 mg by mouth daily.    [provider]  Ensure (ENSURE) Take 237 mLs by mouth 2 (two) times daily as needed (nutritional supplement).    [provider]  ferrous sulfate 325 (65 FE) MG tablet Take 1 tablet (325 mg total) by mouth 2 (two) times daily with a meal. 11/08/19   Domenic Polite, MD  Fluticasone-Salmeterol (ADVAIR DISKUS) 250-50 MCG/DOSE AEPB Inhale 1 puff into the lungs 2 (two) times daily for 14 days. Patient taking differently: Inhale 1 puff into the lungs 2 (two) times daily as needed (shortness of breath).  10/06/19 11/04/19  Kayleen Memos, DO  hydrALAZINE (APRESOLINE) 10 MG tablet Take 1 tablet (10 mg total) by mouth 2 (two) times daily. 09/23/19 12/22/19  Verta Ellen., NP  isosorbide mononitrate (IMDUR) 30 MG 24 hr tablet Take 1 tablet (30 mg total) by mouth daily. 11/08/19   Domenic Polite, MD  metoprolol succinate (TOPROL-XL) 50 MG 24 hr tablet Take 3 tablets (150 mg total) by mouth  daily. Take with or immediately following a meal. 10/07/19 12/06/19  Kayleen Memos, DO  nitroGLYCERIN (NITROSTAT) 0.4 MG SL tablet Place 1 tablet (0.4 mg total) under the tongue every 5 (five) minutes as needed for chest pain (Up to 3 doses). 10/22/19   Theora Gianotti, NP  pantoprazole (PROTONIX) 40 MG tablet Take 1 tablet (40 mg total) by mouth 2 (two) times daily. 11/08/19   Domenic Polite, MD  potassium chloride SA (KLOR-CON) 20 MEQ tablet Take 1 tablet (20 mEq total) by mouth 2 (two) times daily. 10/06/19 12/05/19  Kayleen Memos, DO  torsemide (DEMADEX) 20 MG tablet Take 3 tablets (60 mg total) by mouth 2 (two) times daily. 10/06/19 12/05/19  Kayleen Memos, DO    Physical Exam: Vitals:   11/15/19 1300 11/15/19 1315 11/15/19 1645 11/15/19 1717  BP: (!) 100/49 115/65 110/70 114/63  Pulse:  64 (!) 59 (!) 115  Resp: (!) 27 20 (!) 23   Temp:      TempSrc:      SpO2:  100% 95%   Weight:    88 kg  Height:    6\' 6"  (1.981 m)     . General:  Appears unhappy and uncomfortable . Eyes:  PERRL, EOMI, normal lids, iris . ENT:  grossly normal hearing, lips & tongue, mmm . Neck:  no LAD, masses or thyromegaly . Cardiovascular:  RRR, no m/r/g. No LE edema.  Marland Kitchen Respiratory:   CTA bilaterally with no wheezes/rales/rhonchi.  Normal respiratory effort. . Abdomen:  soft, NT, ND, NABS . Skin:  no rash or induration seen on limited exam . Musculoskeletal:  no bony abnormality; legs appear cool with diminished pulses B, skin mildly lichenified, no apparent ulcerations . Psychiatric:  blunted mood and affect, speech fluent and appropriate, AOx3 . Neurologic:  CN 2-12 grossly intact, moves all extremities in coordinated fashion    Radiological Exams on Admission: DG Chest Portable 1 View  Result Date: 11/15/2019 CLINICAL DATA:  Shortness of breath and bilateral leg swelling for a few days. EXAM: PORTABLE CHEST 1 VIEW COMPARISON:  The single-view of the chest 11/04/2019 and 09/28/2019. FINDINGS: There  is cardiomegaly mild interstitial edema. Extensive atherosclerosis. No pneumothorax or pleural fluid. No acute bony abnormality. IMPRESSION: Cardiomegaly and interstitial edema. Atherosclerosis. Electronically Signed   By: Inge Rise M.D.   On: 11/15/2019 13:06   VAS [place] LOWER EXTREMITY VENOUS (DVT) ([facility] and [facility] 7a-7p)  Result Date: 11/15/2019  Lower Venous DVTStudy Indications: Edema, and SOB.  Comparison Study: No prior study Performing Technologist: Maudry Mayhew MHA, RDMS, RVT, RDCS  Examination Guidelines: A complete evaluation includes B-mode imaging, spectral Doppler, color Doppler, and power Doppler as needed of all accessible portions of each vessel. Bilateral testing is considered an integral part of a complete examination. Limited examinations for reoccurring indications may be performed as noted. The reflux portion of the exam is performed with the patient in reverse Trendelenburg.  +---------+---------------+---------+-----------+----------+--------------+ RIGHT    CompressibilityPhasicitySpontaneityPropertiesThrombus Aging +---------+---------------+---------+-----------+----------+--------------+ CFV      Full           Yes      Yes                                 +---------+---------------+---------+-----------+----------+--------------+  SFJ      Full                                                        +---------+---------------+---------+-----------+----------+--------------+ FV Prox  Full                                                        +---------+---------------+---------+-----------+----------+--------------+ FV Mid   Full                                                        +---------+---------------+---------+-----------+----------+--------------+ FV DistalFull                                                        +---------+---------------+---------+-----------+----------+--------------+ PFV      Full                                                         +---------+---------------+---------+-----------+----------+--------------+ POP      Full           Yes      Yes                                 +---------+---------------+---------+-----------+----------+--------------+ PTV      Full                                                        +---------+---------------+---------+-----------+----------+--------------+ PERO     Full                                                        +---------+---------------+---------+-----------+----------+--------------+   +---------+---------------+---------+-----------+----------+--------------+ LEFT     CompressibilityPhasicitySpontaneityPropertiesThrombus Aging +---------+---------------+---------+-----------+----------+--------------+ CFV      Full           Yes      Yes                                 +---------+---------------+---------+-----------+----------+--------------+ SFJ      Full                                                        +---------+---------------+---------+-----------+----------+--------------+  FV Prox  Full                                                        +---------+---------------+---------+-----------+----------+--------------+ FV Mid   Full                                                        +---------+---------------+---------+-----------+----------+--------------+ FV DistalFull                                                        +---------+---------------+---------+-----------+----------+--------------+ PFV      Full                                                        +---------+---------------+---------+-----------+----------+--------------+ POP      Full           Yes      Yes                                 +---------+---------------+---------+-----------+----------+--------------+ PTV      Full                                                         +---------+---------------+---------+-----------+----------+--------------+ PERO     Full                                                        +---------+---------------+---------+-----------+----------+--------------+     Summary: RIGHT: - There is no evidence of deep vein thrombosis in the lower extremity.  - No cystic structure found in the popliteal fossa. - Ultrasound characteristics of enlarged lymph nodes are noted in the groin.  LEFT: - There is no evidence of deep vein thrombosis in the lower extremity.  - No cystic structure found in the popliteal fossa.  Pulstaile venous flow is noted bilaterally, suggestive of possible elevated right heart pressure.  *See table(s) above for measurements and observations.    Preliminary     EKG: Independently reviewed.  Afib with rate 72; nonspecific ST changes with no evidence of acute ischemia   Labs on Admission: I have personally reviewed the available labs and imaging studies at the time of the admission.  Pertinent labs:   BUN 40/Creatinine 2.56/GFR 23 - at or near baseline creatinine BNP 2374.7; 1655.3 on 3/1 WBC 8.8 Hgb 9.2 - improved   Assessment/Plan Principal Problem:   PVD (peripheral vascular disease) (HCC) Active Problems:  Essential hypertension   DM (diabetes mellitus) (HCC)   CKD (chronic kidney disease), stage III   AAA (abdominal aortic aneurysm) (HCC)   CAD S/P PCI   Persistent atrial fibrillation (HCC)   CHF (congestive heart failure), NYHA class IV, acute, systolic (HCC)   PVD -Significant PVD, which appears to be the source of his pain -His description of symptoms appears to be directly related to this issue -As such, I called and requested vascular surgery consult -Dr. Trula Slade has seen the patient and agrees; he thinks that this is repairable with likely fem-fem bypass -Will admit -Plan is to ensure volume status stability over the weekend -Will plan to do arteriogram next week, likely Monday -Start  Heparin for now -If this were non-operable, palliative care would be the next reasonable step - but fortunately, intervention does appear to be appropriate.  Acute on chronic systolic CHF -Recent echo with EF 25-30% -CXR today with mild volume overload -BNP is elevated -He was given Lasix in the ER -He did not appear to be volume overloaded at the time of my evaluation -For now, will allow patient to eat and will hold both IVF and Lasix and follow renal function -Will continue home Demadex  Afib on Coumadin -Rate control with Toprol -Hold Eliquis -Will give with Heparin for now given likely long duration of hospitalization  DM -Diet controlled with what appears to be appropriate control -Will not add coverage at this time  HTN -Continue Hydralazine, Toprol  HLD -Continue Lipitor  CAD -Continue Imdur, 81 mg ASA  Stage 4 CKD -Appears to be at/near baseline at this time -Will need to be closely followed with diuresis/hydration to maintain appropriate volume status  AAA -3.0, stable, and unchanged on [place] on 03/15/19  COPD -Change Advair ([place] formulary substitution) to BID standing rather than PRN -Continue Albuterol prn    Note: This patient has been tested and is pending for the novel coronavirus COVID-19.  DVT prophylaxis: Heparin Code Status:  Full - confirmed with patient Family Communication: None present; I spoke with the patient's Logan by telephone at the time of admission. Disposition Plan: He is anticipated to d/c to home without [facility] services once his acute medical issues have been resolved; however, since he is likely to remain in the hospital for up to a week or more, his discharge condition may change over that period of time. Consults called: Vascular surgery Admission status: Admit - It is my clinical opinion that admission to INPATIENT is reasonable and necessary because of the expectation that this patient will require hospital care that crosses at least  2 midnights to treat this condition based on the medical complexity of the problems presented.  Given the aforementioned information, the predictability of an adverse outcome is felt to be significant.    Karmen Bongo MD [facility]   How to contact the [facility] Attending or Consulting provider [place] or covering provider during after hours [place], for this patient?  1. Check the care team in [facility] and look for a) attending/consulting [facility] provider listed and b) the [facility] team listed 2. Log into [email or web address] and use [facility] universal password to access. If you do not have the password, please contact the hospital operator. 3. Locate the [facility] provider you are looking for under [facility] and page to a number that you can be directly reached. 4. If you still have difficulty reaching the provider, please page the [facility] (Director on Call) for the Hospitalists listed on  amion for assistance.   11/15/2019, 6:28 PM

## 2019-11-15 NOTE — Progress Notes (Signed)
Bilateral lower extremity venous duplex completed. Refer to "CV Proc" under chart review to view preliminary results.  11/15/2019 12:19 PM Kelby Aline., MHA, RVT, RDCS, RDMS

## 2019-11-15 NOTE — Telephone Encounter (Signed)
Spoke with pt's wife, Mardene Celeste,  informed her his rx for Torsemide was sent to [facility].  She informed me he had an appointment this am, however Mr. Henard had to be sent to the hospital.

## 2019-11-15 NOTE — ED Provider Notes (Signed)
[facility] EMERGENCY DEPARTMENT Provider Note   CSN: [id number] Arrival date & time: 11/15/19  1041     History Chief Complaint  Patient presents with  . Leg Pain    Logan White is a 79 y.o. male with a past medical history of CAD, ischemic cardiomyopathy, CKD, AAA, persistent A. Fib anticoagulated with Eliquis, who presents today for evaluation of shortness of breath. He reports that he had worsening shortness of breath and swelling of the left leg starting yesterday.  He reports compliance with all of his medications however reports that he is unable to move around much at home due to feeling so short of breath.  He denies any fevers or coughing.  He reports feeling like he is having a hard time getting air in.  Made worse with exertion.  Made better with being still.  He denies any fevers, headache. He does report pain in his left leg.  He was previously seen in the past week for similar and was scheduled to have outpatient ultrasound today however was unable to arrive due to shortness of breath.  His primary cardiologist is Dr. [name].  It appears that patient has been questionably compliant with his dietary and water restrictions according to chart review.  Yesterday Dr. [name] recommended 80 mg of Demadex twice daily for 2 days then resuming 60 mg twice daily.   HPI     Past Medical History:  Diagnosis Date  . AAA (abdominal aortic aneurysm) ([facility])    a. 08/2015 Abd U/S: 2.7 x 2.9 x 3.2 cm infrarenal AAA.  . Bradycardia    a. Requiring discontinuation of use of BB/CCB  . Cardiac arrest - ventricular fibrillation    a. 03/2012 in setting of hypokalemia (prolonged hosp with VDRF, tracheobronchitis, ARF, shock liver, PAF, AMS felt secondary to post-anoxic encephalopathy/shock).  . Carotid artery stenosis    a. 01/2011 - 40-59% bilateral stenosis;  b. 06/2015 Carotid U/S: 40-59% bilat ICA stenosis->f/u 6 mos.  . CKD (chronic kidney disease), stage III    . Coronary artery disease    a. s/p aborted ant STEMI tx with Cypher DES to LAD 10/04 (residual at cath: D1 50%, CFX 40% and multiple dist 70%, EF 55%);   b. myoview 3/10: Ef 47%, infero-apical isch, LOW RISK - med Tx recommended;  c. 12/2012 VF Arrest/Cath: LAD 40isr, 80 apical, LCX 100 (failed PTCA), RCA nondom.  . Diabetes mellitus   . Diverticulosis 2001  . Elevated LFTs    Shock liver 03/2012  . H/O: CVA (cardiovascular accident)   . Hematemesis    a. 12/2010 felt 2/2 Mallory Weiss tear - pt could not afford colonscopy/EGD at that time so Coumadin was deferred. Coumadin initiated 03/2012 without any evidence for bleeding.  Marland Kitchen History of Ischemic Cardiomyopathy    a. EF 50-55% by echo 03/09/12 (was 30% by echo 02/24/12); b. 03/2013 Echo: EF 55-60%, mild MR, sev dil LA, mod dil RA, PASP 1mmHg.  Marland Kitchen Hypertensive heart disease    a. echo 4/12: EF 50%, asymmetric septal hypertrophy, no SAM or LVOT gradient, LAE, PASP 35  . Inguinal hernia   . 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 Mitral regurgitation    a. Mild by echo 03/2012 and 03/2013.  . Mixed Hyperlipidemia   . Peripheral vascular disease ([place])    a. h/o LE angioplasty;  b. 08/2015 Duplex: >50% RCIA, 100% REIA, >50% LEIA, elev vel in SMA, patent IVC.  Marland Kitchen Persistent atrial fibrillation (  [place])    a. Dx 12/2010-->coumadin (CHA2DS2VASc = 7).  . QT prolongation     Patient Active Problem List   Diagnosis Date Noted  . Gastritis and gastroduodenitis   . Benign neoplasm of transverse colon   . Microcytic anemia   . Acute blood loss anemia   . Melena   . CHF (congestive heart failure) ([place]) 11/04/2019  . CHF (congestive heart failure), NYHA class IV, acute, systolic ([place]) 62/70/3500  . Persistent atrial fibrillation ([place])   . Acute on chronic systolic CHF (congestive heart failure) ([place]) 06/22/2019  . Acute on chronic systolic (congestive) heart failure ([place]) 06/22/2019  . CAD S/P PCI 10/13/2015  .  Cardiomyopathy, new. Etiology apeears to be NICM 10/13/2015  . AAA (abdominal aortic aneurysm) ([place])   . Peripheral vascular disease ([place])   . Carotid artery stenosis   . Hypertensive heart disease   . Bruit 10/24/2014  . Acute on chronic renal failure ([place]) 01/11/2013  . Acute MI, anterolateral wall, initial episode of care ([place]) 12/31/2012  . Cardiac arrest due to underlying cardiac condition ([place]) 12/31/2012  . VF (ventricular fibrillation) ([place]) 12/31/2012  . Abnormal LFTs 12/31/2012  . Acute on chronic renal insufficiency 12/31/2012  . Chronic anticoagulation 12/31/2012  . Hypokalemia 12/31/2012  . Weakness 04/23/2012  . Long term (current) use of anticoagulants 03/19/2012  . CKD (chronic kidney disease), stage III 03/16/2012  . Acute confusion/delerium 03/12/2012  . Delirium 03/12/2012  . Acute systolic CHF (congestive heart failure) ([place]) 03/11/2012  . Septic shock(785.52) 02/27/2012    Class: Acute  . Cardiogenic shock ([place]) 02/25/2012  . Acute renal failure ([place]) 02/25/2012  . Anoxic encephalopathy ([place]) 02/25/2012  . Ischemic cardiomyopathy 02/25/2012  . Cardiac arrest ([place]) 02/23/2012  . Acute respiratory failure with hypoxia ([place]) 02/23/2012  . Ventricular fibrillation ([place]) 02/23/2012  . DM (diabetes mellitus) ([place]) 02/23/2012  . Screening for colon cancer 02/10/2011  . Hematemesis 01/13/2011  . Dyspnea 01/13/2011  . RENAL INSUFFICIENCY 08/26/2010  . HYPERPOTASSEMIA 09/15/2009  . TOBACCO ABUSE 08/11/2009  . GLUCOSE INTOLERANCE 10/02/2008  . Hyperlipidemia 10/02/2008  . Essential hypertension 10/02/2008  . Cerebrovascular disease 10/02/2008  . PERIPHERAL VASCULAR DISEASE 10/02/2008  . INGUINAL HERNIA 10/02/2008    Past Surgical History:  Procedure Laterality Date  . BIOPSY  11/07/2019   Procedure: BIOPSY;  Surgeon: [name], MD;  Location: [facility];  Service: Endoscopy;;  . COLONOSCOPY WITH PROPOFOL N/A 11/07/2019   Procedure: COLONOSCOPY WITH  PROPOFOL;  Surgeon: [name], MD;  Location[facility] END[facility]COPY;  Service: Endoscopy;  Laterality: N/A;  . ESOPHAGOGASTRODUODENOSCOPY (EGD) WITH PROPOFOL N/A 11/07/2019   Procedure: ESOPHAGOGASTRODUODENOSCOPY (EGD) WITH PROPOFOL;  Surgeon: [name], MD;  Location: [facility] ENDOSCOPY;  Service: Endoscopy;  Laterality: N/A;  . HEMOSTASIS CLIP PLACEMENT  11/07/2019   Procedure: HEMOSTASIS CLIP PLACEMENT;  Surgeon: [name], MD;  Location: [facility] ENDOSCOPY;  Service: Endoscopy;;  . HERNIA REPAIR    . LEFT HEART CATH N/A 12/30/2012   Procedure: LEFT HEART CATH;  Surgeon: [name], MD;  Location: [facility] CATH LAB;  Service: Cardiovascular;  Laterality: N/A;  . ORIF TIBIA & FIBULA FRACTURES  01/11/07   OPEN TX OF UNICONDYLAR PLATEAU FRACTURE, IRRIGATION/DEBRIDEMENT OF OPEN FRACTURE INCLUDING BONE, REMOVAL OF EXTERNAL FIXATOR UNDER ANESTHESIA PER DR. MICHAEL HANDY  . PERIPHERAL VASCULAR CATHETERIZATION N/A 12/21/2015   Procedure: Lower Extremity Angiography;  Surgeon: [name], MD;  Location: [facility] CV LAB;  Service: Cardiovascular;  Laterality: N/A;  .  POLYPECTOMY  11/07/2019   Procedure: POLYPECTOMY;  Surgeon: [name], MD;  Location: [facility];  Service: Endoscopy;;  . POPLITEAL ARTERY ANGIOPLASTY  01/07/07   s/p LEFT POPLITEAL ARTERY EXPLORATION AND VEIN PATCH ANGIOPLASTY, PER DR. EARLY, SECONDARY TO ISCHEMIC LEFT FOOT RELATED TO LEFT POPLITEAL ARTERY INJURY  . RIGHT HEART CATH N/A 10/02/2019   Procedure: RIGHT HEART CATH;  Surgeon: [name], MD;  Location: [facility] CV LAB;  Service: Cardiovascular;  Laterality: N/A;  . SKIN GRAFT    . TEE WITHOUT CARDIOVERSION N/A 08/08/2019   Procedure: TRANSESOPHAGEAL ECHOCARDIOGRAM (TEE);  Surgeon: Lelon Perla, MD;  Location: [facility] ENDOSCOPY;  Service: Cardiovascular;  Laterality: N/A;       Family History  Problem Relation Age of Onset  . Heart disease Father        ALSO UNCLE DIED HAD CAD  . Heart failure Father   .  Brain cancer Mother   . Leukemia Mother   . Sickle cell anemia Mother   . Sickle cell anemia Other     Social History   Tobacco Use  . Smoking status: Former Smoker    Packs/day: 0.50    Years: 30.00    Pack years: 15.00    Types: Cigarettes    Quit date: 03/12/2012    Years since quitting: 7.6  . Smokeless tobacco: Never Used  Substance Use Topics  . Alcohol use: No    Alcohol/week: 0.0 standard drinks  . Drug use: No    Frequency: 2.0 times per week    Home Medications Prior to Admission medications   Medication Sig Start Date End Date Taking? Authorizing Provider  acetaminophen (TYLENOL) 325 MG tablet Take 650 mg by mouth every 6 (six) hours as needed for headache.    [provider]  albuterol (VENTOLIN HFA) 108 (90 Base) MCG/ACT inhaler Inhale 2 puffs into the lungs every 6 (six) hours as needed for wheezing or shortness of breath. 08/26/19   Lelon Perla, MD  apixaban (ELIQUIS) 5 MG TABS tablet Take 1 tablet (5 mg total) by mouth 2 (two) times daily. 11/14/19   Domenic Polite, MD  aspirin EC 81 MG EC tablet Take 1 tablet (81 mg total) by mouth daily. 10/07/19 12/06/19  Kayleen Memos, DO  atorvastatin (LIPITOR) 80 MG tablet Take 80 mg by mouth daily.    [provider]  Ensure (ENSURE) Take 237 mLs by mouth 2 (two) times daily as needed (nutritional supplement).    [provider]  ferrous sulfate 325 (65 FE) MG tablet Take 1 tablet (325 mg total) by mouth 2 (two) times daily with a meal. 11/08/19   Domenic Polite, MD  Fluticasone-Salmeterol (ADVAIR DISKUS) 250-50 MCG/DOSE AEPB Inhale 1 puff into the lungs 2 (two) times daily for 14 days. Patient taking differently: Inhale 1 puff into the lungs 2 (two) times daily as needed (shortness of breath).  10/06/19 11/04/19  Kayleen Memos, DO  hydrALAZINE (APRESOLINE) 10 MG tablet Take 1 tablet (10 mg total) by mouth 2 (two) times daily. 09/23/19 12/22/19  Verta Ellen., NP  isosorbide mononitrate (IMDUR)  30 MG 24 hr tablet Take 1 tablet (30 mg total) by mouth daily. 11/08/19   Domenic Polite, MD  metoprolol succinate (TOPROL-XL) 50 MG 24 hr tablet Take 3 tablets (150 mg total) by mouth daily. Take with or immediately following a meal. 10/07/19 12/06/19  Kayleen Memos, DO  nitroGLYCERIN (NITROSTAT) 0.4 MG SL tablet Place 1 tablet (  0.4 mg total) under the tongue every 5 (five) minutes as needed for chest pain (Up to 3 doses). 10/22/19   Theora Gianotti, NP  pantoprazole (PROTONIX) 40 MG tablet Take 1 tablet (40 mg total) by mouth 2 (two) times daily. 11/08/19   Domenic Polite, MD  potassium chloride SA (KLOR-CON) 20 MEQ tablet Take 1 tablet (20 mEq total) by mouth 2 (two) times daily. 10/06/19 12/05/19  Kayleen Memos, DO  torsemide (DEMADEX) 20 MG tablet Take 3 tablets (60 mg total) by mouth 2 (two) times daily. 10/06/19 12/05/19  Kayleen Memos, DO    Allergies    Patient has no known allergies.  Review of Systems   Review of Systems  Constitutional: Negative for chills and fever.  Respiratory: Positive for shortness of breath.   Cardiovascular: Positive for leg swelling. Negative for chest pain and palpitations.  Gastrointestinal: Negative for abdominal pain, nausea and vomiting.  Genitourinary: Negative for dysuria.  Musculoskeletal: Negative for back pain and neck pain.  Neurological: Negative for weakness and headaches.  All other systems reviewed and are negative.   Physical Exam Updated Vital Signs BP 113/65   Pulse 68   Temp 97.6 F (36.4 C) (Oral)   Resp 20   Ht 6\' 3"  (1.905 m)   Wt 83 kg   SpO2 100%   BMI 22.87 kg/m   Physical Exam Vitals and nursing note reviewed.  Constitutional:      Appearance: He is well-developed.  HENT:     Head: Normocephalic and atraumatic.  Eyes:     Conjunctiva/sclera: Conjunctivae normal.  Cardiovascular:     Rate and Rhythm: Normal rate and regular rhythm.     Heart sounds: Normal heart sounds. No murmur.     Comments: Unable to  locate pulses in bilateral lower extremities.  +1 right femoral pulse, unable to palpate left femoral pulse. Pulmonary:     Effort: Pulmonary effort is normal. No respiratory distress.     Breath sounds: Normal breath sounds.  Abdominal:     Palpations: Abdomen is soft.     Tenderness: There is no abdominal tenderness.  Musculoskeletal:     Cervical back: Normal range of motion and neck supple.     Right lower leg: No edema.     Left lower leg: Edema present.     Comments: There is at the in the bilateral thighs primarily along the posterior aspect.  Left thigh is obviously larger than right thigh.  Skin:    General: Skin is warm and dry.     Comments: Bilateral lower legs have shiny, hairless skin that is firm, consistent with arterial vascular disease.  Neurological:     General: No focal deficit present.     Mental Status: He is alert.  Psychiatric:        Mood and Affect: Mood normal.        Behavior: Behavior normal.     ED Results / Procedures / Treatments   Labs (all labs ordered are listed, but only abnormal results are displayed) Labs Reviewed  CBC - Abnormal; Notable for the following components:      Result Value   RBC 3.61 (*)    Hemoglobin 9.2 (*)    HCT 29.3 (*)    MCH 25.5 (*)    RDW 21.2 (*)    nRBC 0.6 (*)    All other components within normal limits  BASIC METABOLIC PANEL - Abnormal; Notable for the following components:   BUN  40 (*)    Creatinine, Ser 2.56 (*)    Calcium 8.6 (*)    GFR calc non Af Amer 23 (*)    GFR calc Af Amer 27 (*)    All other components within normal limits  BRAIN NATRIURETIC PEPTIDE - Abnormal; Notable for the following components:   B Natriuretic Peptide 2,374.7 (*)    All other components within normal limits  MAGNESIUM  POC SARS CORONAVIRUS 2 AG -  ED    EKG None  Radiology DG Chest Portable 1 View  Result Date: 11/15/2019 CLINICAL DATA:  Shortness of breath and bilateral leg swelling for a few days. EXAM: PORTABLE  CHEST 1 VIEW COMPARISON:  The single-view of the chest 11/04/2019 and 09/28/2019. FINDINGS: There is cardiomegaly mild interstitial edema. Extensive atherosclerosis. No pneumothorax or pleural fluid. No acute bony abnormality. IMPRESSION: Cardiomegaly and interstitial edema. Atherosclerosis. Electronically Signed   By: Inge Rise M.D.   On: 11/15/2019 13:06   VAS [place] LOWER EXTREMITY VENOUS (DVT) ([facility] and [facility] 7a-7p)  Result Date: 11/15/2019  Lower Venous DVTStudy Indications: Edema, and SOB.  Comparison Study: No prior study Performing Technologist: Maudry Mayhew MHA, RDMS, RVT, RDCS  Examination Guidelines: A complete evaluation includes B-mode imaging, spectral Doppler, color Doppler, and power Doppler as needed of all accessible portions of each vessel. Bilateral testing is considered an integral part of a complete examination. Limited examinations for reoccurring indications may be performed as noted. The reflux portion of the exam is performed with the patient in reverse Trendelenburg.  +---------+---------------+---------+-----------+----------+--------------+ RIGHT    CompressibilityPhasicitySpontaneityPropertiesThrombus Aging +---------+---------------+---------+-----------+----------+--------------+ CFV      Full           Yes      Yes                                 +---------+---------------+---------+-----------+----------+--------------+ SFJ      Full                                                        +---------+---------------+---------+-----------+----------+--------------+ FV Prox  Full                                                        +---------+---------------+---------+-----------+----------+--------------+ FV Mid   Full                                                        +---------+---------------+---------+-----------+----------+--------------+ FV DistalFull                                                         +---------+---------------+---------+-----------+----------+--------------+ PFV      Full                                                        +---------+---------------+---------+-----------+----------+--------------+  POP      Full           Yes      Yes                                 +---------+---------------+---------+-----------+----------+--------------+ PTV      Full                                                        +---------+---------------+---------+-----------+----------+--------------+ PERO     Full                                                        +---------+---------------+---------+-----------+----------+--------------+   +---------+---------------+---------+-----------+----------+--------------+ LEFT     CompressibilityPhasicitySpontaneityPropertiesThrombus Aging +---------+---------------+---------+-----------+----------+--------------+ CFV      Full           Yes      Yes                                 +---------+---------------+---------+-----------+----------+--------------+ SFJ      Full                                                        +---------+---------------+---------+-----------+----------+--------------+ FV Prox  Full                                                        +---------+---------------+---------+-----------+----------+--------------+ FV Mid   Full                                                        +---------+---------------+---------+-----------+----------+--------------+ FV DistalFull                                                        +---------+---------------+---------+-----------+----------+--------------+ PFV      Full                                                        +---------+---------------+---------+-----------+----------+--------------+ POP      Full           Yes      Yes                                  +---------+---------------+---------+-----------+----------+--------------+  PTV      Full                                                        +---------+---------------+---------+-----------+----------+--------------+ PERO     Full                                                        +---------+---------------+---------+-----------+----------+--------------+     Summary: RIGHT: - There is no evidence of deep vein thrombosis in the lower extremity.  - No cystic structure found in the popliteal fossa. - Ultrasound characteristics of enlarged lymph nodes are noted in the groin.  LEFT: - There is no evidence of deep vein thrombosis in the lower extremity.  - No cystic structure found in the popliteal fossa.  Pulstaile venous flow is noted bilaterally, suggestive of possible elevated right heart pressure.  *See table(s) above for measurements and observations.    Preliminary     Procedures Procedures (including critical care time)  Medications Ordered in ED Medications  albuterol (VENTOLIN HFA) 108 (90 Base) MCG/ACT inhaler 2 puff (has no administration in time range)  AeroChamber Plus Flo-Vu Large MISC 1 each (has no administration in time range)    ED Course  I have reviewed the triage vital signs and the nursing notes.  Pertinent labs & imaging results that were available during my care of the patient were reviewed by me and considered in my medical decision making (see chart for details).  Clinical Course as of Nov 15 1935  Fri Nov 15, 2019  1346 I spoke with Dr. Lorin Mercy who will see patient for admission.   [EH]    Clinical Course User Index [EH] Lorin Glass, PA-C   MDM Rules/Calculators/A&P                     Patient presents today for evaluation of shortness of breath and swelling in his left thigh. DVT ultrasound was obtained without evidence of DVT. Chart review shows that according to Dr. Doren Custard who saw patient on 11/05/2019 he was unable to palpate a  right-sided femoral pulse and a barely palpable femoral pulse on the left.  On my exam this is unchanged today.  Patient does appear to have edema in his left sided thigh.  Clinically he appears fluid overloaded with woody pitting edema to the bilateral posterior thighs.  Work-up today shows his BNP is elevated at 2300 up from 1611 days ago.  His hemoglobin is 9.2 which is improved from 11 days ago.  He did have diagnosis of erosive gastropathy and had transfusion during his previous hospital admission, his apixaban was initially held however was resumed yesterday on 3/11.  On exam he is short of breath with mild accessory muscle usage.  This is significantly exacerbated with any movement even in bed.  Clinically concern for CHF exacerbation, however patient does have history of COPD, will give dose of home albuterol and plan for admission.  Given patient's creatinine is slightly elevated from his baseline I do not think it would be appropriate to aggressively diurese him while in the emergency room with discharge.   I spoke  with Dr. Lorin Mercy who will see patient for admission. Chart review shows when he was seen by vascular during the previous admission the were not at that time recommending any operative interventions.  Note: Portions of this report may have been transcribed using voice recognition software. Every effort was made to ensure accuracy; however, inadvertent computerized transcription errors may be present  Final Clinical Impression(s) / ED Diagnoses Final diagnoses:  CHF (congestive heart failure), NYHA class IV, acute, systolic (HCC)  Acute renal failure superimposed on stage 4 chronic kidney disease, unspecified acute renal failure type [facility])    Rx / DC Orders ED Discharge Orders    None       Ollen Gross 11/15/19 1940    Davonna Belling, MD 11/18/19 [phone]

## 2019-11-15 NOTE — Progress Notes (Signed)
ANTICOAGULATION CONSULT NOTE - Initial Consult  Pharmacy Consult for heparin Indication: AFib/PVD  No Known Allergies  Patient Measurements: Height: 6\' 6"  (198.1 cm) Weight: 194 lb 1.6 oz (88 kg) IBW/kg (Calculated) : 91.4 Heparin Dosing Weight: 88kg  Vital Signs: Temp: 97.6 F (36.4 C) (03/12 1045) Temp Source: Oral (03/12 1045) BP: 114/63 (03/12 1717) Pulse Rate: 115 (03/12 1717)  Labs: Recent Labs    11/15/19 1110 11/15/19 1318 11/15/19 1430  HGB 9.2*  --   --   HCT 29.3*  --   --   PLT 270  --   --   CREATININE 2.56*  --   --   TROPONINIHS  --  7 8    Estimated Creatinine Clearance: 30.6 mL/min (A) (by C-G formula based on SCr of 2.56 mg/dL (H)).   Medical History: Past Medical History:  Diagnosis Date  . AAA (abdominal aortic aneurysm) ([place])    a. 08/2015 Abd U/S: 2.7 x 2.9 x 3.2 cm infrarenal AAA.  . Bradycardia    a. Requiring discontinuation of use of BB/CCB  . Cardiac arrest - ventricular fibrillation    a. 03/2012 in setting of hypokalemia (prolonged hosp with VDRF, tracheobronchitis, ARF, shock liver, PAF, AMS felt secondary to post-anoxic encephalopathy/shock).  . Carotid artery stenosis    a. 01/2011 - 40-59% bilateral stenosis;  b. 06/2015 Carotid U/S: 40-59% bilat ICA stenosis->f/u 6 mos.  . CKD (chronic kidney disease), stage III   . Coronary artery disease    a. s/p aborted ant STEMI tx with Cypher DES to LAD 10/04 (residual at cath: D1 50%, CFX 40% and multiple dist 70%, EF 55%);   b. myoview 3/10: Ef 47%, infero-apical isch, LOW RISK - med Tx recommended;  c. 12/2012 VF Arrest/Cath: LAD 40isr, 80 apical, LCX 100 (failed PTCA), RCA nondom.  . Diabetes mellitus   . Diverticulosis 2001  . Elevated LFTs    Shock liver 03/2012  . H/O: CVA (cardiovascular accident)   . Hematemesis    a. 12/2010 felt 2/2 Mallory Weiss tear - pt could not afford colonscopy/EGD at that time so Coumadin was deferred. Coumadin initiated 03/2012 without any evidence for  bleeding.  Marland Kitchen History of Ischemic Cardiomyopathy    a. EF 50-55% by echo 03/09/12 (was 30% by echo 02/24/12); b. 03/2013 Echo: EF 55-60%, mild MR, sev dil LA, mod dil RA, PASP 3mmHg.  Marland Kitchen Hypertensive heart disease    a. echo 4/12: EF 50%, asymmetric septal hypertrophy, no SAM or LVOT gradient, LAE, PASP 35  . Inguinal hernia   . 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 Mitral regurgitation    a. Mild by echo 03/2012 and 03/2013.  . Mixed Hyperlipidemia   . Peripheral vascular disease ([place])    a. h/o LE angioplasty;  b. 08/2015 Duplex: >50% RCIA, 100% REIA, >50% LEIA, elev vel in SMA, patent IVC.  Marland Kitchen Persistent atrial fibrillation ([place])    a. Dx 12/2010-->coumadin (CHA2DS2VASc = 7).  . QT prolongation      Assessment: 61 yoM admitted with significant limb ischemia, planning for fem-fem bypass next week. Pt on apixaban PTA for hx AFib but has not been taking since last discharge (11/08/19) as it was held for melena last admit with plans to resume after 7 days. Wife reports this was never restarted PTA so will not need to dose via aPTTs.  Goal of Therapy:  Heparin level 0.3-0.7 units/ml Monitor platelets by anticoagulation protocol: Yes   Plan:  -Heparin 4000 units  x1 -Heparin 1350 units/hr -Check 8-hr heparin level -Monitor heparin level, CBC, S/Sx bleeding daily  Arrie Senate, PharmD, BCPS Clinical Pharmacist [phone] Please check AMION for all [place] numbers 11/15/2019

## 2019-11-15 NOTE — ED Triage Notes (Signed)
Pt arrives via EMS with bilateral leg pain and sent for DVT eval. Chronic 2L O2. Hx of Afib and on blood thinners. Pt was here for 5 days for numbness to right leg. Left leg is bigger than right.

## 2019-11-16 DIAGNOSIS — E872 Acidosis: Secondary | ICD-10-CM

## 2019-11-16 DIAGNOSIS — Z7189 Other specified counseling: Secondary | ICD-10-CM

## 2019-11-16 DIAGNOSIS — I50810 Right heart failure, unspecified: Secondary | ICD-10-CM

## 2019-11-16 DIAGNOSIS — I5023 Acute on chronic systolic (congestive) heart failure: Secondary | ICD-10-CM

## 2019-11-16 DIAGNOSIS — I739 Peripheral vascular disease, unspecified: Secondary | ICD-10-CM

## 2019-11-16 DIAGNOSIS — Z515 Encounter for palliative care: Secondary | ICD-10-CM

## 2019-11-16 DIAGNOSIS — R5381 Other malaise: Secondary | ICD-10-CM

## 2019-11-16 DIAGNOSIS — N189 Chronic kidney disease, unspecified: Secondary | ICD-10-CM

## 2019-11-16 DIAGNOSIS — R7989 Other specified abnormal findings of blood chemistry: Secondary | ICD-10-CM

## 2019-11-16 DIAGNOSIS — I10 Essential (primary) hypertension: Secondary | ICD-10-CM

## 2019-11-16 LAB — BASIC METABOLIC PANEL
Anion gap: 15 (ref 5–15)
BUN: 49 mg/dL — ABNORMAL HIGH (ref 8–23)
CO2: 18 mmol/L — ABNORMAL LOW (ref 22–32)
Calcium: 8.6 mg/dL — ABNORMAL LOW (ref 8.9–10.3)
Chloride: 101 mmol/L (ref 98–111)
Creatinine, Ser: 2.74 mg/dL — ABNORMAL HIGH (ref 0.61–1.24)
GFR calc Af Amer: 25 mL/min — ABNORMAL LOW (ref >60)
GFR calc non Af Amer: 22 mL/min — ABNORMAL LOW (ref >60)
Glucose, Bld: 139 mg/dL — ABNORMAL HIGH (ref 70–99)
Potassium: 4.3 mmol/L (ref 3.5–5.1)
Sodium: 134 mmol/L — ABNORMAL LOW (ref 135–145)

## 2019-11-16 LAB — CBC WITH DIFFERENTIAL/PLATELET
Abs Immature Granulocytes: 0.03 10*3/uL (ref 0.00–0.07)
Basophils Absolute: 0 10*3/uL (ref 0.0–0.1)
Basophils Relative: 1 %
Eosinophils Absolute: 0.1 10*3/uL (ref 0.0–0.5)
Eosinophils Relative: 1 %
HCT: 27.5 % — ABNORMAL LOW (ref 39.0–52.0)
Hemoglobin: 9.1 g/dL — ABNORMAL LOW (ref 13.0–17.0)
Immature Granulocytes: 0 %
Lymphocytes Relative: 16 %
Lymphs Abs: 1.3 10*3/uL (ref 0.7–4.0)
MCH: 25.3 pg — ABNORMAL LOW (ref 26.0–34.0)
MCHC: 33.1 g/dL (ref 30.0–36.0)
MCV: 76.6 fL — ABNORMAL LOW (ref 80.0–100.0)
Monocytes Absolute: 0.9 10*3/uL (ref 0.1–1.0)
Monocytes Relative: 11 %
Neutro Abs: 6 10*3/uL (ref 1.7–7.7)
Neutrophils Relative %: 71 %
Platelets: 261 10*3/uL (ref 150–400)
RBC: 3.59 MIL/uL — ABNORMAL LOW (ref 4.22–5.81)
RDW: 20.7 % — ABNORMAL HIGH (ref 11.5–15.5)
WBC: 8.4 10*3/uL (ref 4.0–10.5)
nRBC: 0.5 % — ABNORMAL HIGH (ref 0.0–0.2)

## 2019-11-16 LAB — IRON AND TIBC
Iron: 23 ug/dL — ABNORMAL LOW (ref 45–182)
Saturation Ratios: 5 % — ABNORMAL LOW (ref 17.9–39.5)
TIBC: 447 ug/dL (ref 250–450)
UIBC: 424 ug/dL

## 2019-11-16 LAB — RETICULOCYTES
Immature Retic Fract: 44.1 % — ABNORMAL HIGH (ref 2.3–15.9)
RBC.: 3.83 MIL/uL — ABNORMAL LOW (ref 4.22–5.81)
Retic Count, Absolute: 107.2 10*3/uL (ref 19.0–186.0)
Retic Ct Pct: 2.8 % (ref 0.4–3.1)

## 2019-11-16 LAB — VITAMIN B12: Vitamin B-12: 683 pg/mL (ref 180–914)

## 2019-11-16 LAB — HEPARIN LEVEL (UNFRACTIONATED)
Heparin Unfractionated: 0.51 [IU]/mL (ref 0.30–0.70)
Heparin Unfractionated: 0.7 [IU]/mL (ref 0.30–0.70)

## 2019-11-16 LAB — CBC
HCT: 27 % — ABNORMAL LOW (ref 39.0–52.0)
Hemoglobin: 9 g/dL — ABNORMAL LOW (ref 13.0–17.0)
MCH: 25.6 pg — ABNORMAL LOW (ref 26.0–34.0)
MCHC: 33.3 g/dL (ref 30.0–36.0)
MCV: 76.9 fL — ABNORMAL LOW (ref 80.0–100.0)
Platelets: 254 10*3/uL (ref 150–400)
RBC: 3.51 MIL/uL — ABNORMAL LOW (ref 4.22–5.81)
RDW: 21.1 % — ABNORMAL HIGH (ref 11.5–15.5)
WBC: 8.2 10*3/uL (ref 4.0–10.5)
nRBC: 0.5 % — ABNORMAL HIGH (ref 0.0–0.2)

## 2019-11-16 LAB — FERRITIN: Ferritin: 43 ng/mL (ref 24–336)

## 2019-11-16 LAB — FOLATE: Folate: 14.8 ng/mL (ref >5.9)

## 2019-11-16 MED ORDER — METOPROLOL SUCCINATE ER 50 MG PO TB24
50.0000 mg | ORAL_TABLET | Freq: Every day | ORAL | Status: DC
  Administered 2019-11-16 – 2019-11-21 (×6): 50 mg via ORAL
  Filled 2019-11-16 (×6): qty 1

## 2019-11-16 MED ORDER — FUROSEMIDE 10 MG/ML IJ SOLN
100.0000 mg | Freq: Once | INTRAVENOUS | Status: AC
  Administered 2019-11-16: 100 mg via INTRAVENOUS
  Filled 2019-11-16: qty 10

## 2019-11-16 MED ORDER — DIPHENHYDRAMINE HCL 25 MG PO CAPS
25.0000 mg | ORAL_CAPSULE | Freq: Once | ORAL | Status: AC
  Administered 2019-11-16: 25 mg via ORAL
  Filled 2019-11-16: qty 1

## 2019-11-16 NOTE — Progress Notes (Signed)
Awaiting lasix bag from pharmacy. Confirmed compatibility between heparin and lasix for sake of limited IV access. Will initiate infusion/IVPB lasix once it is received from pharm.

## 2019-11-16 NOTE — Evaluation (Signed)
Physical Therapy Evaluation Patient Details Name: Logan White MRN: [id number] DOB: 11/22/1940 Today's Date: 11/16/2019   History of Present Illness  79 y.o. male with medical history significant of afib on Coumadin; PVD; HLD; h/o CVA; DM; CAD s/p stent; stage 4 CKD; and AAA presenting with B leg pain.  He reports that he is here due to his R > L legs - pain, they go numb on him. Pt with recent admission 3/1-3/5 for RLE weakness, CHF exacerbation, and anemia. Pt scheduled for aortogram monday with pending intervention following per vascular surgery.  Clinical Impression  Pt presents to PT with deficits in sensation, pain, power, gait, and activity tolerance. Pt reports heaviness in RLE, denies pain during this session but reports RLE pain with increased activity, and reports reduced sensation in LLE. Pt is able to perform all mobility without physical assistance at this time, but remains at a risk for falls due to sensation and endurance deficits. Pt will benefit from continued acute PT POC to improve activity tolerance and restore independent mobility.    Follow Up Recommendations No PT follow up;Supervision - Intermittent    Equipment Recommendations  Rolling walker with 5" wheels    Recommendations for Other Services       Precautions / Restrictions Precautions Precautions: Fall Precaution Comments:  R LE numbness/heaviness with ambulation Restrictions Weight Bearing Restrictions: No      Mobility  Bed Mobility Overal bed mobility: Modified Independent Bed Mobility: Supine to Sit     Supine to sit: Modified independent (Device/Increase time)     General bed mobility comments: increased time  Transfers Overall transfer level: Needs assistance Equipment used: Rolling walker (2 wheeled) Transfers: Sit to/from Stand Sit to Stand: Supervision            Ambulation/Gait Ambulation/Gait assistance: Supervision Gait Distance (Feet): 100 Feet Assistive device:  Rolling walker (2 wheeled) Gait Pattern/deviations: Step-through pattern Gait velocity: functional Gait velocity interpretation: 1.31 - 2.62 ft/sec, indicative of limited community ambulator General Gait Details: pt with steady step through gait, slow and deliberate turns during mobility  Stairs            Wheelchair Mobility    Modified Rankin (Stroke Patients Only)       Balance Overall balance assessment: Needs assistance Sitting-balance support: No upper extremity supported;Feet supported Sitting balance-Leahy Scale: Normal       Standing balance-Leahy Scale: Good Standing balance comment: supervision with unilateral UE support of RW                             Pertinent Vitals/Pain Pain Assessment: No/denies pain    Home Living Family/patient expects to be discharged to:: Private residence Living Arrangements: Spouse/significant other Available Help at Discharge: Family;Available 24 hours/day Type of Home: Apartment Home Access: Level entry     Home Layout: One level Home Equipment: Cane - single point      Prior Function Level of Independence: Independent with assistive device(s)         Comments: uses cane, reports he doesn't drive and uses power buggy at grocery store     [place] Hand: Right    Extremity/Trunk Assessment   Upper Extremity Assessment Upper Extremity Assessment: Overall WFL for tasks assessed    Lower Extremity Assessment Lower Extremity Assessment: RLE deficits/detail;LLE deficits/detail RLE Deficits / Details: heaviness during activity LLE Deficits / Details: reduced sensation below knee LLE Sensation: decreased light touch  Cervical / Trunk Assessment Cervical / Trunk Assessment: Normal  Communication   Communication: No difficulties  Cognition Arousal/Alertness: Awake/alert Behavior During Therapy: WFL for tasks assessed/performed Overall Cognitive Status: Within Functional Limits  for tasks assessed                                        General Comments General comments (skin integrity, edema, etc.): pt with audible wheezing on 2L , not on pulse ox monitoring and portable pulse ox unable to read at this time/ wheezing reduced once seated and resting.    Exercises     Assessment/Plan    PT Assessment Patient needs continued PT services  PT Problem List Decreased activity tolerance;Decreased balance;Decreased mobility;Cardiopulmonary status limiting activity;Pain;Impaired sensation       PT Treatment Interventions DME instruction;Gait training;Functional mobility training;Therapeutic activities;Therapeutic exercise;Balance training;Neuromuscular re-education;Patient/family education    PT Goals (Current goals can be found in the Care Plan section)  Acute Rehab PT Goals Patient Stated Goal: To reduce pain in RLE PT Goal Formulation: With patient Time For Goal Achievement: 11/30/19 Potential to Achieve Goals: Good Additional Goals Additional Goal #1: Pt will maintain dynamic standing balance within 10 inches of his base of support without UE support independently.    Frequency Min 3X/week   Barriers to discharge        Co-evaluation               AM-PAC PT "6 Clicks" Mobility  Outcome Measure Help needed turning from your back to your side while in a flat bed without using bedrails?: None Help needed moving from lying on your back to sitting on the side of a flat bed without using bedrails?: None Help needed moving to and from a bed to a chair (including a wheelchair)?: None Help needed standing up from a chair using your arms (e.g., wheelchair or bedside chair)?: None Help needed to walk in hospital room?: None Help needed climbing 3-5 steps with a railing? : A Little 6 Click Score: 23    End of Session Equipment Utilized During Treatment: Oxygen Activity Tolerance: Patient tolerated treatment well Patient left: in  bed;with call bell/phone within reach;with bed alarm set Nurse Communication: Mobility status PT Visit Diagnosis: Difficulty in walking, not elsewhere classified (R26.2);Pain Pain - Right/Left: Right Pain - part of body: Leg    Time: 1045-1059 PT Time Calculation (min) (ACUTE ONLY): 14 min   Charges:   PT Evaluation $PT Eval Moderate Complexity: 1 Mod          Zenaida Niece, PT, DPT Acute Rehabilitation Pager: [phone]   Zenaida Niece 11/16/2019, 11:12 AM

## 2019-11-16 NOTE — Consult Note (Addendum)
Cardiology Consultation:   Patient ID: Logan White MRN: [id number]; DOB: February 15, 1941  Admit date: 11/15/2019 Date of Consult: 11/16/2019  Primary Care Provider: Patient, No Pcp Per Primary Cardiologist: Kirk Ruths, MD  Primary Electrophysiologist:  None    Patient Profile:   Logan White is a 79 y.o. male with a hx of permanent Afib, CAD, Vfib arrest, ICM and CHF who is being seen today for the evaluation of CHF who is being seen today for the evaluation of CHF at the request of Dr. Cyndia Skeeters.  History of Present Illness:   Logan White is a pleasant 79 yo male. He is followed by Dr. [name] as an outpatient. He underwent PCI to the LAD in 10/04. Admitted in June of 2013 after a Vfib arrest which was felt to be related to severe hypokalemia. Cardiac cath in April 2014 a patent stent in the LAD with 40% ISR, apical LAD lesion of 80%, occluded LCx. Attempted CTO PCI of the left Lcx which was unsuccessful. Echo at that time showed an EF of 30-35% with mild reduced RV function. He was evaluated Dr. Lovena Le for possible ICD but deferred given his acute NSTEMI. He was discharged with a lifevest.   Myoview in 1/17 showed a fixed inferior defect but no ischemia. PV angiogram in 4/17 showed an occluded right external iliac and common femoral artery which was not amenable to PCI.   Admitted in Nov 2020 with lower extremity edema and shortness of breath. Treated with IV lasix. Echo showed an EF of 30-35% with severe MR, and moderate AI. He was seen back in the office for follow up with Almyra Deforest, PA shortly afterwards and ICD was discussed again but patient did not wish to pursue. Was agreeable to MR work up and underwent TEE which showed EF 25 to 30%, biatrial enlargement, moderate MR, mild tricuspid regurgitation. He was later seen in the office for follow up and suspected his breathing issues my be related to underlying COPD and was referred to pulmonary.    He was readmitted on 09/28/19 with  exertional dyspnea with possible syncope. He underwent a right heart cath on 10/02/2019, this revealed moderate pulmonary hypertension with mean pressure of 42 mmHg, elevated mean pulmonary wedge pressure of 20 mmHg, cardiac output 4.24, cardiac index 2.0, pulmonary artery pulsatility index of 1.7 consistent with right heart failure. He underwent aggressive IV diuresis and net removal of 2 L of fluid. Discharged on Demadex 60mg  BID along with outpatient cardiac monitor.   His last hospitalization was in early March for lower extremity weakness and shortness of breath.  He was felt to be mildly volume overloaded and was treated with Lasix IV twice daily and quickly transition to oral diuresis.  His right lower extremity pain was evaluated and felt to be related to vascular insufficiency in the setting of anemia related to GI bleed.  His anemia was investigated by GI.  His aspirin was permanently discontinued.  Apixaban for atrial fibrillation was held intended to be restarted on March 11.  The patient's wife called our office on March 11 stating that there was swelling to both legs more on the left than the right for 2 days with no change in shortness of breath and weight appeared stable.  Dietary indiscretions were described.  At that time he was taking torsemide 60 mg twice daily.  Dr. [name] recommended increasing torsemide to 80 mg twice daily for 2 days then resuming 60 mg twice daily.  The patient tells  me that despite increasing his diuretics, he did not feel a significant difference in his shortness of breath.  He presented to the ER yesterday for worsening shortness of breath and swelling of the left leg for which she had called our office the day prior.  He endorsed shortness of breath with exertion, feeling better when he is still.  He denies any current chest pain.  Past Medical History:  Diagnosis Date  . AAA (abdominal aortic aneurysm) ([place])    a. 08/2015 Abd U/S: 2.7 x 2.9 x 3.2 cm  infrarenal AAA.  . Bradycardia    a. Requiring discontinuation of use of BB/CCB  . Cardiac arrest - ventricular fibrillation    a. 03/2012 in setting of hypokalemia (prolonged hosp with VDRF, tracheobronchitis, ARF, shock liver, PAF, AMS felt secondary to post-anoxic encephalopathy/shock).  . Carotid artery stenosis    a. 01/2011 - 40-59% bilateral stenosis;  b. 06/2015 Carotid U/S: 40-59% bilat ICA stenosis->f/u 6 mos.  . CKD (chronic kidney disease), stage III   . Coronary artery disease    a. s/p aborted ant STEMI tx with Cypher DES to LAD 10/04 (residual at cath: D1 50%, CFX 40% and multiple dist 70%, EF 55%);   b. myoview 3/10: Ef 47%, infero-apical isch, LOW RISK - med Tx recommended;  c. 12/2012 VF Arrest/Cath: LAD 40isr, 80 apical, LCX 100 (failed PTCA), RCA nondom.  . Diabetes mellitus   . Diverticulosis 2001  . Elevated LFTs    Shock liver 03/2012  . H/O: CVA (cardiovascular accident)   . Hematemesis    a. 12/2010 felt 2/2 Mallory Weiss tear - pt could not afford colonscopy/EGD at that time so Coumadin was deferred. Coumadin initiated 03/2012 without any evidence for bleeding.  Marland Kitchen History of Ischemic Cardiomyopathy    a. EF 50-55% by echo 03/09/12 (was 30% by echo 02/24/12); b. 03/2013 Echo: EF 55-60%, mild MR, sev dil LA, mod dil RA, PASP 58mmHg.  Marland Kitchen Hypertensive heart disease    a. echo 4/12: EF 50%, asymmetric septal hypertrophy, no SAM or LVOT gradient, LAE, PASP 35  . Inguinal hernia   . 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 Mitral regurgitation    a. Mild by echo 03/2012 and 03/2013.  . Mixed Hyperlipidemia   . Peripheral vascular disease ([place])    a. h/o LE angioplasty;  b. 08/2015 Duplex: >50% RCIA, 100% REIA, >50% LEIA, elev vel in SMA, patent IVC.  Marland Kitchen Persistent atrial fibrillation ([place])    a. Dx 12/2010-->coumadin (CHA2DS2VASc = 7).  . QT prolongation     Past Surgical History:  Procedure Laterality Date  . BIOPSY  11/07/2019   Procedure:  BIOPSY;  Surgeon: [name], MD;  Location: [facility];  Service: Endoscopy;;  . COLONOSCOPY WITH PROPOFOL N/A 11/07/2019   Procedure: COLONOSCOPY WITH PROPOFOL;  Surgeon: [name], MD;  Location: [facility] ENDOSCOPY;  Service: Endoscopy;  Laterality: N/A;  . ESOPHAGOGASTRODUODENOSCOPY (EGD) WITH PROPOFOL N/A 11/07/2019   Procedure: ESOPHAGOGASTRODUODENOSCOPY (EGD) WITH PROPOFOL;  Surgeon: [name], MD;  Location: [facility] ENDOSCOPY;  Service: Endoscopy;  Laterality: N/A;  . HEMOSTASIS CLIP PLACEMENT  11/07/2019   Procedure: HEMOSTASIS CLIP PLACEMENT;  Surgeon: [name], MD;  Location: [facility] ENDOSCOPY;  Service: Endoscopy;;  . HERNIA REPAIR    . LEFT HEART CATH N/A 12/30/2012   Procedure: LEFT HEART CATH;  Surgeon: [name], MD;  Location: [facility] CATH LAB;  Service: Cardiovascular;  Laterality: N/A;  . ORIF TIBIA & FIBULA  FRACTURES  01/11/07   OPEN TX OF UNICONDYLAR PLATEAU FRACTURE, IRRIGATION/DEBRIDEMENT OF OPEN FRACTURE INCLUDING BONE, REMOVAL OF EXTERNAL FIXATOR UNDER ANESTHESIA PER DR. MICHAEL HANDY  . PERIPHERAL VASCULAR CATHETERIZATION N/A 12/21/2015   Procedure: Lower Extremity Angiography;  Surgeon: [name], MD;  Location: [place] CV LAB;  Service: Cardiovascular;  Laterality: N/A;  . POLYPECTOMY  11/07/2019   Procedure: POLYPECTOMY;  Surgeon: [name], MD;  Location: [place];  Service: Endoscopy;;  . POPLITEAL ARTERY ANGIOPLASTY  01/07/07   s/p LEFT POPLITEAL ARTERY EXPLORATION AND VEIN PATCH ANGIOPLASTY, PER DR. EARLY, SECONDARY TO ISCHEMIC LEFT FOOT RELATED TO LEFT POPLITEAL ARTERY INJURY  . RIGHT HEART CATH N/A 10/02/2019   Procedure: RIGHT HEART CATH;  Surgeon: [name], MD;  Location: [place] CV LAB;  Service: Cardiovascular;  Laterality: N/A;  . SKIN GRAFT    . TEE WITHOUT CARDIOVERSION N/A 08/08/2019   Procedure: TRANSESOPHAGEAL ECHOCARDIOGRAM (TEE);  Surgeon: Lelon Perla, MD;  Location: [facility] ENDOSCOPY;  Service: Cardiovascular;   Laterality: N/A;     Home Medications:  Prior to Admission medications   Medication Sig Start Date End Date Taking? Authorizing Provider  acetaminophen (TYLENOL) 325 MG tablet Take 650 mg by mouth every 6 (six) hours as needed for headache.   Yes [provider]  albuterol (VENTOLIN HFA) 108 (90 Base) MCG/ACT inhaler Inhale 2 puffs into the lungs every 6 (six) hours as needed for wheezing or shortness of breath. 08/26/19  Yes Lelon Perla, MD  atorvastatin (LIPITOR) 80 MG tablet Take 80 mg by mouth daily.   Yes [provider]  Ensure (ENSURE) Take 237 mLs by mouth 2 (two) times daily as needed (nutritional supplement).   Yes [provider]  ferrous sulfate 325 (65 FE) MG tablet Take 1 tablet (325 mg total) by mouth 2 (two) times daily with a meal. 11/08/19  Yes Domenic Polite, MD  Fluticasone-Salmeterol (ADVAIR DISKUS) 250-50 MCG/DOSE AEPB Inhale 1 puff into the lungs 2 (two) times daily for 14 days. Patient taking differently: Inhale 1 puff into the lungs 2 (two) times daily as needed (shortness of breath).  10/06/19 11/15/19 Yes Kayleen Memos, DO  hydrALAZINE (APRESOLINE) 10 MG tablet Take 1 tablet (10 mg total) by mouth 2 (two) times daily. 09/23/19 12/22/19 Yes Verta Ellen., NP  isosorbide mononitrate (IMDUR) 30 MG 24 hr tablet Take 1 tablet (30 mg total) by mouth daily. 11/08/19  Yes Domenic Polite, MD  metoprolol succinate (TOPROL-XL) 50 MG 24 hr tablet Take 3 tablets (150 mg total) by mouth daily. Take with or immediately following a meal. 10/07/19 12/06/19 Yes Hall, Carole N, DO  nitroGLYCERIN (NITROSTAT) 0.4 MG SL tablet Place 1 tablet (0.4 mg total) under the tongue every 5 (five) minutes as needed for chest pain (Up to 3 doses). 10/22/19  Yes Theora Gianotti, NP  oxymetazoline (AFRIN) 0.05 % nasal spray Place 1 spray into both nostrils 2 (two) times daily as needed for congestion.   Yes [provider]  pantoprazole (PROTONIX) 40 MG  tablet Take 1 tablet (40 mg total) by mouth 2 (two) times daily. 11/08/19  Yes Domenic Polite, MD  potassium chloride SA (KLOR-CON) 20 MEQ tablet Take 1 tablet (20 mEq total) by mouth 2 (two) times daily. 10/06/19 12/05/19 Yes Kayleen Memos, DO  torsemide (DEMADEX) 20 MG tablet Take 3 tablets (60 mg total) by mouth 2 (two) times daily. 11/15/19 01/14/20 Yes Crenshaw, Denice Bors, MD  apixaban Arne [place])  5 MG TABS tablet Take 1 tablet (5 mg total) by mouth 2 (two) times daily. 11/14/19   Domenic Polite, MD  aspirin EC 81 MG EC tablet Take 1 tablet (81 mg total) by mouth daily. Patient not taking: Reported on 11/15/2019 10/07/19 12/06/19  Kayleen Memos, DO    Inpatient Medications: Scheduled Meds: . aspirin EC  81 mg Oral Daily  . atorvastatin  80 mg Oral Daily  . metoprolol succinate  50 mg Oral Daily  . mometasone-formoterol  2 puff Inhalation BID  . pantoprazole  40 mg Oral BID  . potassium chloride SA  20 mEq Oral BID  . sodium chloride flush  3 mL Intravenous Q12H  . torsemide  60 mg Oral BID   Continuous Infusions: . sodium chloride    . heparin 1,350 Units/hr (11/16/19 1003)   PRN Meds: sodium chloride, acetaminophen, albuterol, fentaNYL (SUBLIMAZE) injection, ondansetron (ZOFRAN) IV, sodium chloride flush  Allergies:   No Known Allergies  Social History:   Social History   Socioeconomic History  . Marital status: Married    Spouse name: Not on file  . Number of children: 4  . Years of education: Not on file  . Highest education level: Not on file  Occupational History  . 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 Smoking status: Former Smoker    Packs/day: 0.50    Years: 30.00    Pack years: 15.00    Types: Cigarettes    Quit date: 03/12/2012    Years since quitting: 7.6  . Smokeless tobacco: Never Used  Substance and Sexual Activity  . Alcohol use: No    Alcohol/week: 0.0 standard drinks  . Drug use: No    Frequency: 2.0 times per week   . Sexual activity: Not on file  Other Topics Concern  . 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 YES. 1/2 -1 PPD OF CIGARETTES    NO ETOH   Social Determinants of Health   Financial Resource Strain:   . Difficulty of Paying Living Expenses:   Food Insecurity:   . Worried About Charity fundraiser in the Last Year:   . Arboriculturist in the Last Year:   Transportation Needs:   . Film/video editor (Medical):   Marland Kitchen Lack of Transportation (Non-Medical):   Physical Activity:   . Days of Exercise per Week:   . Minutes of Exercise per Session:   Stress:   . Feeling of Stress :   Social Connections:   . Frequency of Communication with Friends and Family:   . Frequency of Social Gatherings with Friends and Family:   . Attends Religious Services:   . Active Member of Clubs or Organizations:   . Attends Archivist Meetings:   Marland Kitchen Marital Status:   Intimate Partner Violence:   . Fear of Current or Ex-Partner:   . Emotionally Abused:   Marland Kitchen Physically Abused:   . Sexually Abused:     Family History:    Family History  Problem Relation Age of Onset  . Heart disease Father        ALSO UNCLE DIED HAD CAD  . Heart failure Father   . Brain cancer Mother   . Leukemia Mother   . Sickle cell anemia Mother   .  Sickle cell anemia Other      ROS:  Please see the history of present illness.   All other ROS reviewed and negative.     Physical Exam/Data:   Vitals:   11/16/19 0539 11/16/19 0715 11/16/19 1006 11/16/19 1116  BP: (!) 101/53  112/73 115/78  Pulse: 75  68 86  Resp: 20  18 18   Temp: 98.4 F (36.9 C)   97.6 F (36.4 C)  TempSrc:    Oral  SpO2: 96% 99%  100%  Weight:      Height:        Intake/Output Summary (Last 24 hours) at 11/16/2019 1153 Last data filed at 11/16/2019 0858 Gross per 24 hour  Intake 635.04 ml  Output 350 ml  Net 285.04 ml    Last 3 Weights 11/16/2019 11/15/2019 11/15/2019  Weight (lbs) 196 lb 12.8 oz 194 lb 1.6 oz 182 lb 15.7 oz  Weight (kg) 89.268 kg 88.043 kg 83 kg  Some encounter information is confidential and restricted. Go to Review Flowsheets activity to see all data.     Body mass index is 22.74 kg/m.  General:  Well nourished, well developed, in no acute distress HEENT: normal Neck: no JVD Cardiac:  normal S1, S2; iRRR; no murmur  Lungs:  Expiratory wheeze diffusely Abd: soft, nontender, no hepatomegaly  Ext: 1+ RLE edema, trace LLE Musculoskeletal:  No deformities, BUE and BLE strength normal and equal Skin: warm and dry  Neuro:  CNs 2-12 intact, no focal abnormalities noted Psych:  Normal affect   EKG:  The EKG was personally reviewed and demonstrates:  Afib, rate 72, septal and lateral infarct. Unchanged from prior 11/04/19 Telemetry:  Telemetry was personally reviewed and demonstrates:  afib  Relevant CV Studies: RHC:  Conclusion   Moderate pulmonary hypertension with mean PA pressure of 42 mmHg.  Mean pulmonary capillary wedge pressure 28 mmHg.  Phasic pulmonary artery pressure 63/29 mmHg, mean RA pressure 20 mmHg, and pulmonary artery pulsatility index is 1.7 (suggesting RV dysfunction)  Mixed venous O2 saturation 57%   ECHO: 09/29/19   1. Left ventricular ejection fraction, by visual estimation, is 25 to  30%. The left ventricle has severely decreased function. There is mildly  increased left ventricular hypertrophy.  2. Left ventricular diastolic function could not be evaluated.  3. Severely dilated left ventricular internal cavity size.  4. The left ventricle demonstrates global hypokinesis.  5. Global right ventricle has severely reduced systolic function.The  right ventricular size is mildly enlarged.  6. Left atrial size was severely dilated.  7. The interatrial septum is aneurysmal.  8. Right atrial size was severely dilated.  9. The mitral valve is normal in  structure. Moderate mitral valve  regurgitation. No evidence of mitral stenosis.  10. The tricuspid valve is normal in structure. Tricuspid valve  regurgitation is mild.  11. The aortic valve is tricuspid. Aortic valve regurgitation is mild.  Mild to moderate aortic valve sclerosis/calcification without any evidence  of aortic stenosis.  12. The pulmonic valve was normal in structure. Pulmonic valve  regurgitation is mild.  13. Moderately elevated pulmonary artery systolic pressure.  14. The inferior vena cava is dilated in size with <50% respiratory  variability, suggesting right atrial pressure of 15 mmHg.  15. Severe global reduction in LV systolic function; mild LVH; 4 chamber  enlargement; severe RV dysfunction; mild AI; moderate MR; mild TR;  moderate pulmonary hypertension.   Laboratory Data:  High Sensitivity Troponin:   Recent Labs  Lab 11/15/19 1318 11/15/19 1430  TROPONINIHS 7 8     Chemistry Recent Labs  Lab 11/15/19 1110 11/16/19 0256  NA 136 134*  K 3.9 4.3  CL 100 101  CO2 22 18*  GLUCOSE 91 139*  BUN 40* 49*  CREATININE 2.56* 2.74*  CALCIUM 8.6* 8.6*  GFRNONAA 23* 22*  GFRAA 27* 25*  ANIONGAP 14 15    No results for input(s): PROT, ALBUMIN, AST, ALT, ALKPHOS, BILITOT in the last 168 hours. Hematology Recent Labs  Lab 11/15/19 1110 11/15/19 2333 11/16/19 0256  WBC 8.8 8.4 8.2  RBC 3.61* 3.59* 3.51*  HGB 9.2* 9.1* 9.0*  HCT 29.3* 27.5* 27.0*  MCV 81.2 76.6* 76.9*  MCH 25.5* 25.3* 25.6*  MCHC 31.4 33.1 33.3  RDW 21.2* 20.7* 21.1*  PLT 270 261 254   BNP Recent Labs  Lab 11/15/19 1110  BNP 2,374.7*    DDimer No results for input(s): DDIMER in the last 168 hours.   Radiology/Studies:  DG Chest Portable 1 View  Result Date: 11/15/2019 CLINICAL DATA:  Shortness of breath and bilateral leg swelling for a few days. EXAM: PORTABLE CHEST 1 VIEW COMPARISON:  The single-view of the chest 11/04/2019 and 09/28/2019. FINDINGS: There is  cardiomegaly mild interstitial edema. Extensive atherosclerosis. No pneumothorax or pleural fluid. No acute bony abnormality. IMPRESSION: Cardiomegaly and interstitial edema. Atherosclerosis. Electronically Signed   By: Inge Rise M.D.   On: 11/15/2019 13:06   VAS [place] LOWER EXTREMITY VENOUS (DVT) ([facility] and [facility] 7a-7p)  Result Date: 11/15/2019  Lower Venous DVTStudy Indications: Edema, and SOB.  Comparison Study: No prior study Performing Technologist: Maudry Mayhew MHA, RDMS, RVT, RDCS  Examination Guidelines: A complete evaluation includes B-mode imaging, spectral Doppler, color Doppler, and power Doppler as needed of all accessible portions of each vessel. Bilateral testing is considered an integral part of a complete examination. Limited examinations for reoccurring indications may be performed as noted. The reflux portion of the exam is performed with the patient in reverse Trendelenburg.  +---------+---------------+---------+-----------+----------+--------------+ RIGHT    CompressibilityPhasicitySpontaneityPropertiesThrombus Aging +---------+---------------+---------+-----------+----------+--------------+ CFV      Full           Yes      Yes                                 +---------+---------------+---------+-----------+----------+--------------+ SFJ      Full                                                        +---------+---------------+---------+-----------+----------+--------------+ FV Prox  Full                                                        +---------+---------------+---------+-----------+----------+--------------+ FV Mid   Full                                                        +---------+---------------+---------+-----------+----------+--------------+ FV DistalFull                                                        +---------+---------------+---------+-----------+----------+--------------+  PFV      Full                                                         +---------+---------------+---------+-----------+----------+--------------+ POP      Full           Yes      Yes                                 +---------+---------------+---------+-----------+----------+--------------+ PTV      Full                                                        +---------+---------------+---------+-----------+----------+--------------+ PERO     Full                                                        +---------+---------------+---------+-----------+----------+--------------+   +---------+---------------+---------+-----------+----------+--------------+ LEFT     CompressibilityPhasicitySpontaneityPropertiesThrombus Aging +---------+---------------+---------+-----------+----------+--------------+ CFV      Full           Yes      Yes                                 +---------+---------------+---------+-----------+----------+--------------+ SFJ      Full                                                        +---------+---------------+---------+-----------+----------+--------------+ FV Prox  Full                                                        +---------+---------------+---------+-----------+----------+--------------+ FV Mid   Full                                                        +---------+---------------+---------+-----------+----------+--------------+ FV DistalFull                                                        +---------+---------------+---------+-----------+----------+--------------+ PFV      Full                                                        +---------+---------------+---------+-----------+----------+--------------+  POP      Full           Yes      Yes                                 +---------+---------------+---------+-----------+----------+--------------+ PTV      Full                                                         +---------+---------------+---------+-----------+----------+--------------+ PERO     Full                                                        +---------+---------------+---------+-----------+----------+--------------+     Summary: RIGHT: - There is no evidence of deep vein thrombosis in the lower extremity.  - No cystic structure found in the popliteal fossa. - Ultrasound characteristics of enlarged lymph nodes are noted in the groin.  LEFT: - There is no evidence of deep vein thrombosis in the lower extremity.  - No cystic structure found in the popliteal fossa.  Pulstaile venous flow is noted bilaterally, suggestive of possible elevated right heart pressure.  *See table(s) above for measurements and observations.    Preliminary      Assessment and Plan:   1. SOB/DOE/acute on chronic systolic congestive heart failure- patient has multifactorial dyspnea with active wheezing on exam consistent with his COPD as well as decreased diuresis and lower extremity swelling at home likely contributed to by his known systolic heart failure.  He has some dietary indiscretions noted at home in previous notes, and feels that his home oral diuretics are not adequately diuresing him.  BNP is elevated compared to the prior hospitalization which was also for concerns of heart failure.  With this in mind we will provide intravenous diuresis and hold his home torsemide today. -Lasix 100 mg IV.  Monitor for response.  He had acute kidney injury during his last hospitalization and responded to 80 mg of IV Lasix twice daily with improvement in renal function.  2.  Chronic kidney disease, baseline creatinine appears to be around 1.9 from late 2020, and closer to 1.5-1.6 in 2019-2020.  Creatinine today is 2.74.  This could represent congestion since he does not feel he is responding to oral home diuretics.  Will monitor after Lasix IV dose 100 mg.  If creatinine continues to worsen would involve our colleagues in  nephrology.  3.  Right lower extremity pain and numbness-seen by Dr. Trula Slade this hospitalization, noted to have chronic peripheral arterial disease with right lower extremity demonstrating occluded right external iliac and common femoral arteries in the past with progression of ischemic changes and symptoms.  Felt to have limb threatening ischemia, plan for angiography to define anatomy early next week.  Plan was for hydration to optimize creatinine, however patient feels he is volume overloaded.  If he does not respond to Lasix challenge and creatinine continues to worsen can attempt gentle hydration in case his renal dysfunction is more representative of a prerenal state.  4.  Anemia-it was recommended at last hospitalization to discontinue aspirin indefinitely.  IV heparin on board.   5.  Primary atrial fibrillation-continue beta-blocker. IV heparin  6.  Mitral regurgitation-moderate on recent echo  7.  Ischemic cardiomyopathy-continue beta-blocker.  Unable to use ARB or Entresto given renal insufficiency.    For questions or updates, please contact [facility] Please consult [email or web address] for contact info under     Signed, Elouise Munroe, MD  11/16/2019 11:53 AM

## 2019-11-16 NOTE — Consult Note (Signed)
Consultation Note Date: 11/16/2019   Patient Name: Logan White  DOB: 27-Jun-1941  MRN: [id number]  Age / Sex: 80 y.o., male  PCP: Patient, No Pcp Per Referring Physician: Mercy Riding, MD  Reason for Consultation: Establishing goals of care  HPI/Patient Profile:  79 year old male with history of A. fib on Eliquis, COPD/chronic RF on 2 L by [place], PVD and AAA followed by Dr. Gwenlyn Found, CVA, DM-2, CAD s/p stent, HBZ-1I and systolic CHF (EF 25 to 96%) presenting with BLE pain and numbness, Rt > Lt, and shortness of breath, and admitted for limb threatening ischemia/PAD and acute on chronic systolic CHF and AKI on VEL-3Y.  CXR with pulmonary edema.  Palliative care has been consulted to help with establishing goals of care. Patient is presently enlisted as full code status.  Clinical Assessment and Goals of Care: I have reviewed medical records including EPIC notes, labs and imaging, received report from bedside RN, assessed the patient.    I met with Logan White to further discuss diagnosis prognosis, GOC, EOL wishes, disposition and options.   I introduced Palliative Medicine as specialized medical care for people living with serious illness. It focuses on providing relief from the symptoms and stress of a serious illness. The goal is to improve quality of life for both the patient and the family.  I asked Logan White to tell me about himself. He shared that he is from [place] originally. He and his wife, Logan White have been married for over twenty seven years. They share one son together. 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 He enjoys cookouts, motorcycles, basketball and football.   In regarding to Logan White home life he presently lives with his wife. He is able to perform all bADLs for himself.   I asked Logan White to give me an impression of his present diease processes. He said that he knows he  is sick. He shared his fear of not making it out of the hospital. He went on to say that we never know when our time will be. He used an analogy of someone coming in and "shooting the place up." I listened to him as continued to share concerns that he would never see his grandson again. Logan White went on to tell me that he had done a lot of bad things in his life but that he has also done a lot of good things.  Logan White told me about his previous health trials. He shared with me that he was an avid motorcyclist for many years. He got in "a few" accidents.   A detailed discussion was had today regarding advanced directives, Kroy shared that if he could not make decisions for himself he would want his wife, Logan White to make them on his behalf.    Concepts specific to code status, artifical feeding and hydration, continued IV antibiotics and rehospitalization was had.  I shared with Ivery my concern given his underlying COPD and heart failure that he would likely not fair well in a code situation. He got  quite upset with me thereafter. He stated that he did not appreciate my approach to this topic. I told him that it is our job to discuss this with every patient in the hospital and to be honest about the potential outcomes. He was clearly upset and got tearful. He stated that I was telling him that he was not going to make it out of the hospital. I tried to de-escalate and clarify that these are topics we must discuss with all patients. He said that he did not like the job I was asked to do.  I asked Logan White what he was afraid of. He went on to tell me that he truly wants to see his grandson. He shares that he would want to leave him a sum of money though he is concerned that his grandsons mother would take it for "bad outfits" and "the club". I asked him what he meant by these statements. He went on to tell me that I and "the other lady" were taking from him. I asked who the other lady is and he said  the admitting doctor in the ER. He stated that she is the reason why he can no longer eat. I stated that our goal is to help him and we have no intention of taking things away.   Logan White went on to continue telling me that he doesn't like the job I do. I kindly told him that perhaps we could revisit a portion of these topics tomorrow. He said that I am welcome to come back to see him.  Discussed with patient the importance of continued conversation with family and their  medical providers regarding overall plan of care and treatment options, ensuring decisions are within the context of the patients values and GOCs.  It appears that Logan White is realizing his own mortality. There are many existential degrees of suffering that he may be contending with. We will continue to offer support as able. I called his wife, Logan White and shared with her the conversation I had with Logan White. She plans to talk to him more about his code status.   Decision Maker: Logan White. Logan White (spouse) [phone]  SUMMARY OF RECOMMENDATIONS   Full code, Full scope  Would be a good candidate for OP Palliative follow up to continue [place] conversations  Chaplain support  Code Status/Advance Care Planning:  Full code   Symptom Management:   Per primary  Palliative Prophylaxis:   Aspiration, Bowel Regimen, Delirium Protocol, Eye Care, Frequent Pain Assessment, Oral Care, Palliative Wound Care and Turn Reposition  Additional Recommendations (Limitations, Scope, Preferences):  Full Scope Treatment  Psycho-social/Spiritual:   Desire for further Chaplaincy support: YES  Additional Recommendations: Caregiving  Support/Resources  Prognosis:   Unable to determine  Discharge Planning: To Be Determined     Primary Diagnoses: Present on Admission: . PVD (peripheral vascular disease) ([place]) . AAA (abdominal aortic aneurysm) ([place]) . CHF (congestive heart failure), NYHA class IV, acute, systolic ([place]) . CKD  (chronic kidney disease), stage III . Essential hypertension . Persistent atrial fibrillation ([place])  I have reviewed the medical record, interviewed the patient and family, and examined the patient. The following aspects are pertinent. Past Medical History:  Diagnosis Date  . AAA (abdominal aortic aneurysm) ([place])    a. 08/2015 Abd U/S: 2.7 x 2.9 x 3.2 cm infrarenal AAA.  . Bradycardia    a. Requiring discontinuation of use of BB/CCB  . Cardiac arrest - ventricular fibrillation    a. 03/2012 in setting  of hypokalemia (prolonged hosp with VDRF, tracheobronchitis, ARF, shock liver, PAF, AMS felt secondary to post-anoxic encephalopathy/shock).  . Carotid artery stenosis    a. 01/2011 - 40-59% bilateral stenosis;  b. 06/2015 Carotid U/S: 40-59% bilat ICA stenosis->f/u 6 mos.  . CKD (chronic kidney disease), stage III   . Coronary artery disease    a. s/p aborted ant STEMI tx with Cypher DES to LAD 10/04 (residual at cath: D1 50%, CFX 40% and multiple dist 70%, EF 55%);   b. myoview 3/10: Ef 47%, infero-apical isch, LOW RISK - med Tx recommended;  c. 12/2012 VF Arrest/Cath: LAD 40isr, 80 apical, LCX 100 (failed PTCA), RCA nondom.  . Diabetes mellitus   . Diverticulosis 2001  . Elevated LFTs    Shock liver 03/2012  . H/O: CVA (cardiovascular accident)   . Hematemesis    a. 12/2010 felt 2/2 Mallory Weiss tear - pt could not afford colonscopy/EGD at that time so Coumadin was deferred. Coumadin initiated 03/2012 without any evidence for bleeding.  Marland Kitchen History of Ischemic Cardiomyopathy    a. EF 50-55% by echo 03/09/12 (was 30% by echo 02/24/12); b. 03/2013 Echo: EF 55-60%, mild MR, sev dil LA, mod dil RA, PASP 34mHg.  .Marland KitchenHypertensive heart disease    a. echo 4/12: EF 50%, asymmetric septal hypertrophy, no SAM or LVOT gradient, LAE, PASP 35  . Inguinal hernia   . 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 Mitral regurgitation    a. Mild by echo 03/2012 and 03/2013.  . Mixed  Hyperlipidemia   . [place]ipheral vascular disease ([place]    a. h/o LE angioplasty;  b. 08/2015 Duplex: >50% RCIA, 100% REIA, >50% LEIA, elev vel in SMA, patent IVC.  .Marland KitchenPersistent atrial fibrillation ([place]    a. Dx 12/2010-->coumadin (CHA2DS2VASc = 7).  . QT prolongation    Social History   Socioeconomic History  . Marital status: Married    Spouse name: Not on file  . Number of children: 4  . Years of education: Not on file  . Highest education level: Not on file  Occupational History  . 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 Smoking status: Former Smoker    Packs/day: 0.50    Years: 30.00    Pack years: 15.00    Types: Cigarettes    Quit date: 03/12/2012    Years since quitting: 7.6  . Smokeless tobacco: Never Used  Substance and Sexual Activity  . Alcohol use: No    Alcohol/week: 0.0 standard drinks  . Drug use: No    Frequency: 2.0 times per week  . Sexual activity: Not on file  Other Topics Concern  . 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 YES. 1/2 -1 PPD OF CIGARETTES    NO ETOH   Social Determinants of Health   Financial Resource Strain:   . Difficulty of Paying Living Expenses:   Food Insecurity:   . Worried About RCharity fundraiserin the Last Year:   . RArboriculturistin the Last Year:   Transportation Needs:   . LFilm/video editor(Medical):   .Marland KitchenLack of Transportation (Non-Medical):   Physical Activity:   . Days of Exercise per Week:   . Minutes of Exercise per Session:   Stress:   .  Feeling of Stress :   Social Connections:   . Frequency of Communication with Friends and Family:   . Frequency of Social Gatherings with Friends and Family:   . Attends Religious Services:   . Active Member of Clubs or Organizations:   . Attends Archivist Meetings:   Marland Kitchen Marital Status:    Family History   Problem Relation Age of Onset  . Heart disease Father        ALSO UNCLE DIED HAD CAD  . Heart failure Father   . Brain cancer Mother   . Leukemia Mother   . Sickle cell anemia Mother   . Sickle cell anemia Other    Scheduled Meds: . atorvastatin  80 mg Oral Daily  . metoprolol succinate  50 mg Oral Daily  . mometasone-formoterol  2 puff Inhalation BID  . pantoprazole  40 mg Oral BID  . potassium chloride SA  20 mEq Oral BID  . sodium chloride flush  3 mL Intravenous Q12H   Continuous Infusions: . sodium chloride    . furosemide    . heparin 1,350 Units/hr (11/16/19 1003)   PRN Meds:.sodium chloride, acetaminophen, albuterol, fentaNYL (SUBLIMAZE) injection, ondansetron (ZOFRAN) IV, sodium chloride flush Medications Prior to Admission:  Prior to Admission medications   Medication Sig Start Date End Date Taking? Authorizing Provider  acetaminophen (TYLENOL) 325 MG tablet Take 650 mg by mouth every 6 (six) hours as needed for headache.   Yes [provider]  albuterol (VENTOLIN HFA) 108 (90 Base) MCG/ACT inhaler Inhale 2 puffs into the lungs every 6 (six) hours as needed for wheezing or shortness of breath. 08/26/19  Yes Lelon Perla, MD  atorvastatin (LIPITOR) 80 MG tablet Take 80 mg by mouth daily.   Yes [provider]  Ensure (ENSURE) Take 237 mLs by mouth 2 (two) times daily as needed (nutritional supplement).   Yes [provider]  ferrous sulfate 325 (65 FE) MG tablet Take 1 tablet (325 mg total) by mouth 2 (two) times daily with a meal. 11/08/19  Yes Domenic Polite, MD  Fluticasone-Salmeterol (ADVAIR DISKUS) 250-50 MCG/DOSE AEPB Inhale 1 puff into the lungs 2 (two) times daily for 14 days. Patient taking differently: Inhale 1 puff into the lungs 2 (two) times daily as needed (shortness of breath).  10/06/19 11/15/19 Yes Kayleen Memos, DO  hydrALAZINE (APRESOLINE) 10 MG tablet Take 1 tablet (10 mg total) by mouth 2 (two) times daily. 09/23/19 12/22/19  Yes Verta Ellen., NP  isosorbide mononitrate (IMDUR) 30 MG 24 hr tablet Take 1 tablet (30 mg total) by mouth daily. 11/08/19  Yes Domenic Polite, MD  metoprolol succinate (TOPROL-XL) 50 MG 24 hr tablet Take 3 tablets (150 mg total) by mouth daily. Take with or immediately following a meal. 10/07/19 12/06/19 Yes Hall, Carole N, DO  nitroGLYCERIN (NITROSTAT) 0.4 MG SL tablet Place 1 tablet (0.4 mg total) under the tongue every 5 (five) minutes as needed for chest pain (Up to 3 doses). 10/22/19  Yes Theora Gianotti, NP  oxymetazoline (AFRIN) 0.05 % nasal spray Place 1 spray into both nostrils 2 (two) times daily as needed for congestion.   Yes [provider]  pantoprazole (PROTONIX) 40 MG tablet Take 1 tablet (40 mg total) by mouth 2 (two) times daily. 11/08/19  Yes Domenic Polite, MD  potassium chloride SA (KLOR-CON) 20 MEQ tablet Take 1 tablet (20 mEq total) by mouth 2 (two) times daily. 10/06/19 12/05/19 Yes Kayleen Memos,  DO  torsemide (DEMADEX) 20 MG tablet Take 3 tablets (60 mg total) by mouth 2 (two) times daily. 11/15/19 01/14/20 Yes Crenshaw, Denice Bors, MD  apixaban (ELIQUIS) 5 MG TABS tablet Take 1 tablet (5 mg total) by mouth 2 (two) times daily. 11/14/19   Domenic Polite, MD  aspirin EC 81 MG EC tablet Take 1 tablet (81 mg total) by mouth daily. Patient not taking: Reported on 11/15/2019 10/07/19 12/06/19  Kayleen Memos, DO  No Known Allergies Review of Systems  Constitutional: Positive for activity change and appetite change.  Respiratory: Positive for shortness of breath and wheezing.   Cardiovascular: Positive for leg swelling.   Physical Exam Vitals and nursing note reviewed.  HENT:     Head: Normocephalic.     Nose: Nose normal.     Mouth/Throat:     Mouth: Mucous membranes are dry.  Eyes:     Pupils: Pupils are equal, round, and reactive to light.  Cardiovascular:     Rate and Rhythm: Normal rate and regular rhythm.     Pulses: Normal pulses.  Pulmonary:      Effort: Pulmonary effort is normal.  Abdominal:     General: Abdomen is flat.  Musculoskeletal:        General: Normal range of motion.  Skin:    General: Skin is warm and dry.     Capillary Refill: Capillary refill takes less than 2 seconds.  Neurological:     General: No focal deficit present.     Mental Status: He is alert.  Psychiatric:        Mood and Affect: Mood normal.   Vital Signs: BP 115/78 (BP Location: Right Arm)   Pulse 86   Temp 97.6 F (36.4 C) (Oral)   Resp 18   Ht '6\' 6"'  (1.981 m)   Wt 89.3 kg   SpO2 100%   BMI 22.74 kg/m  Pain Scale: 0-10   Pain Score: Asleep  SpO2: SpO2: 100 % O2 Device:SpO2: 100 % O2 Flow Rate: .O2 Flow Rate (L/min): 2 L/min  IO: Intake/output summary:   Intake/Output Summary (Last 24 hours) at 11/16/2019 1724 Last data filed at 11/16/2019 1400 Gross per 24 hour  Intake 1115.04 ml  Output 350 ml  Net 765.04 ml   LBM: Last BM Date: 11/16/19 Baseline Weight: Weight: 83 kg Most recent weight: Weight: 89.3 kg     Palliative Assessment/Data: 30%  Time In: 1730 Time Out: 1640 Time Total: 70 Greater than 50%  of this time was spent counseling and coordinating care related to the above assessment and plan.  Signed by: Rosezella Rumpf, NP   Please contact Palliative Medicine Team phone at [phone] for questions and concerns.  For individual provider: See Shea Evans

## 2019-11-16 NOTE — Progress Notes (Signed)
Patient sleeping, heparin infusing.

## 2019-11-16 NOTE — Progress Notes (Signed)
Patient awake alert and oriented upon shift report. Pts IV access was pulled out when he adjusted to eat breakfast. Heparin stopped at 0720, pharmacy notified.

## 2019-11-16 NOTE — Progress Notes (Signed)
ANTICOAGULATION CONSULT NOTE  Pharmacy Consult for heparin Indication: AFib/PVD  No Known Allergies  Patient Measurements: Height: 6\' 6"  (198.1 cm) Weight: 196 lb 12.8 oz (89.3 kg) IBW/kg (Calculated) : 91.4 Heparin Dosing Weight: 88kg  Vital Signs: Temp: 97.7 F (36.5 C) (03/13 0022) Temp Source: Oral (03/13 0022) BP: 107/79 (03/13 0022) Pulse Rate: 82 (03/13 0022)  Labs: Recent Labs    11/15/19 1110 11/15/19 1110 11/15/19 1318 11/15/19 1430 11/15/19 2333 11/16/19 0256  HGB 9.2*   < >  --   --  9.1* 9.0*  HCT 29.3*  --   --   --  27.5* 27.0*  PLT 270  --   --   --  261 254  HEPARINUNFRC  --   --   --   --   --  0.51  CREATININE 2.56*  --   --   --   --  2.74*  TROPONINIHS  --   --  7 8  --   --    < > = values in this interval not displayed.    Estimated Creatinine Clearance: 29 mL/min (A) (by C-G formula based on SCr of 2.74 mg/dL (H)).  Assessment: 79 y.o. male with h/o Afib, Eliquis on hold, for heparin.  Goal of Therapy:  Heparin level 0.3-0.7 units/ml Monitor platelets by anticoagulation protocol: Yes   Plan:  Continue Heparin at current rate   Phillis Knack, PharmD, BCPS  11/16/2019

## 2019-11-16 NOTE — Progress Notes (Signed)
ANTICOAGULATION CONSULT NOTE  Pharmacy Consult for heparin Indication: AFib/PVD  No Known Allergies  Patient Measurements: Height: 6\' 6"  (198.1 cm) Weight: 196 lb 12.8 oz (89.3 kg) IBW/kg (Calculated) : 91.4 Heparin Dosing Weight: 88kg  Vital Signs: Temp: 97.6 F (36.4 C) (03/13 1116) Temp Source: Oral (03/13 1116) BP: 115/63 (03/13 1852) Pulse Rate: 95 (03/13 1852)  Labs: Recent Labs    11/15/19 1110 11/15/19 1110 11/15/19 1318 11/15/19 1430 11/15/19 2333 11/16/19 0256 11/16/19 1816  HGB 9.2*   < >  --   --  9.1* 9.0*  --   HCT 29.3*  --   --   --  27.5* 27.0*  --   PLT 270  --   --   --  261 254  --   HEPARINUNFRC  --   --   --   --   --  0.51 0.70  CREATININE 2.56*  --   --   --   --  2.74*  --   TROPONINIHS  --   --  7 8  --   --   --    < > = values in this interval not displayed.    Estimated Creatinine Clearance: 29 mL/min (A) (by C-G formula based on SCr of 2.74 mg/dL (H)).  Assessment: 79 y.o. male with h/o Afib, Eliquis on hold, for heparin. Heparin drip rate 1350 uts/hr HL increased from earlier today 0.5>0.7 No bleeding noted   Goal of Therapy:  Heparin level 0.3-0.7 units/ml Monitor platelets by anticoagulation protocol: Yes   Plan:  Decrease heparin drip 1200 uts/hr  Daily HL, CBC  Monitor s/s bleeding    Bonnita Nasuti Pharm.D. CPP, BCPS Clinical Pharmacist [phone] 11/16/2019 7:05 PM

## 2019-11-16 NOTE — Progress Notes (Signed)
Multiple attempts to replace IV access, including one by MD.   Order for IV team ASAP.

## 2019-11-16 NOTE — Progress Notes (Signed)
PROGRESS NOTE  Logan White JJH:[id number] DOB: [birth year]   PCP: Patient, No Pcp Per  Patient is from: Home.  Lives with his wife.  Uses walker and cane at home.  DOA: 11/15/2019 LOS: 1  Brief Narrative / Interim history: 79 year old male with history of A. fib on Eliquis, COPD/chronic RF on 2 L by [place], PVD and AAA followed by Dr. Gwenlyn Found, CVA, DM-2, CAD s/p stent, YJE-5U and systolic CHF (EF 25 to 31%) presenting with BLE pain and numbness, Rt > Lt, and shortness of breath, and admitted for limb threatening ischemia/PAD and acute on chronic systolic CHF and AKI on SHF-0Y.  CXR with pulmonary edema.  Evaluated by vascular surgery who is planning angiogram once renal function improves for revascularization next week.  Cardiology consulted for CHF exacerbation.  Subjective: No major events overnight or this morning.  Reports shortness of breath.  Denies chest pain.  Still with some pain and numbness in RLE.  Creatinine slightly worse.  Soft blood pressures.  Only 350 cc urine output charted.  Objective: Vitals:   11/16/19 0539 11/16/19 0715 11/16/19 1006 11/16/19 1116  BP: (!) 101/53  112/73 115/78  Pulse: 75  68 86  Resp: 20  18 18   Temp: 98.4 F (36.9 C)   97.6 F (36.4 C)  TempSrc:    Oral  SpO2: 96% 99%  100%  Weight:      Height:        Intake/Output Summary (Last 24 hours) at 11/16/2019 1200 Last data filed at 11/16/2019 0858 Gross per 24 hour  Intake 635.04 ml  Output 350 ml  Net 285.04 ml   Filed Weights   11/15/19 1047 11/15/19 1717 11/16/19 0333  Weight: 83 kg 88 kg 89.3 kg    Examination:  GENERAL: No acute distress.  Appears well.  HEENT: MMM.  Vision and hearing grossly intact.  NECK: Supple.  No apparent JVD.  RESP: On 2 L by [place].  No IWOB.  Fair aeration bilaterally.  No crackles on exam. CVS:  RRR. Heart sounds normal.  ABD/GI/GU: Bowel sounds present. Soft. Non tender.  MSK/EXT:  Moves extremities. No apparent deformity. No edema.  No palpable DP  or PT pulses. SKIN: no apparent skin lesion or wound NEURO: Awake, alert and oriented fairly..  No apparent focal neuro deficit. PSYCH: Calm. Normal affect.  Procedures:  None  Assessment & Plan: PVD/limb threatening ischemia: Followed by Dr. Gwenlyn Found -Per DDS, plan for angiogram and revascularization once renal function improves. -Hold Eliquis.  Continue IV heparin, statin and aspirin.  -Check lipid panel -Consult palliative care  Acute on chronic systolic CHF/RV failure: Echo on 09/29/2019 with EF of 25 to 63%, unknown diastolic function, global hypokinesis, severely reduced RVSF, severe LAE/RAE, interatrial septum aneurysm, and moderate pulm HTN.  RHC in 1/21 with pulmonary hypertension and RV dysfunction.  CXR concerning for pulmonary edema.  BNP 2400 (1600 two weeks ago).  No crackles on lung exam.  Only 300 cc urine output charted.  Creatinine trended up.  Soft blood pressures. -Decrease metoprolol and hold hydralazine and Imdur for better renal perfusion -Continue torsemide 60 mg twice daily-home dose -Consult cardiology -Monitor fluid status, renal function and electrolytes  AKI on CKD-3B: Cr 2.06 (on 3/5)> 2.56 (admit)> 2.74.  BUN 40 (admit)> 49.  Likely cardiorenal due to CHF.  -Adjusted cardiac meds as above. -Continue monitoring  Persistent Afib: On high-dose metoprolol and Eliquis -Reduce metoprolol the setting of soft blood pressures -Eliquis on hold for anticipated revascularization  of PAD -IV heparin for anticoagulation  DM-2 with CKD-3b: Blood glucose was in fair range.  Does not seem to be on medication for this. -Check hemoglobin A1c and lipid panel.  Hypertension: Soft blood pressures elevated this morning but normotensive now. -Reduce metoprolol, and discontinue hydralazine and Imdur  Chronic respiratory failure: On 2 L at baseline. Chronic COPD -Wean oxygen as able -Continue Dulera and as needed albuterol  HLD -Lipid panel -Continue Lipitor  CAD  s/p DES to LAD in 2004, PCI in 2014.  No chest pain. -Continue BB, statin and aspirin.  AAA: 3.0, stable, and unchanged on [place] on 03/15/19 -Continue statin and aspirin   Debility/physical deconditioning: Uses walker at baseline. -PT/OT   Goal of care: Patient with multiple comorbidities as above.  He is full code as of now which I believe would pose more harm than benefit. -Consult palliative care  Nutrition: -Consult dietitian                DVT prophylaxis: On heparin drip Code Status: Full code-confirmed with patient and patient's wife Family Communication: I updated patient's wife over the phone.  Discharge barrier: Evaluation and treatment of limb threatening ischemia, CHF exacerbation and AKI Patient is from: Home Final disposition: To be determined  Consultants: VVS, cardiology, palliative care   Microbiology summarized: COVID-19 screen negative.  Sch Meds:  Scheduled Meds:  aspirin EC  81 mg Oral Daily   atorvastatin  80 mg Oral Daily   metoprolol succinate  50 mg Oral Daily   mometasone-formoterol  2 puff Inhalation BID   pantoprazole  40 mg Oral BID   potassium chloride SA  20 mEq Oral BID   sodium chloride flush  3 mL Intravenous Q12H   torsemide  60 mg Oral BID   Continuous Infusions:  sodium chloride     heparin 1,350 Units/hr (11/16/19 1003)   PRN Meds:.sodium chloride, acetaminophen, albuterol, fentaNYL (SUBLIMAZE) injection, ondansetron (ZOFRAN) IV, sodium chloride flush  Antimicrobials: Anti-infectives (From admission, onward)   None       I have personally reviewed the following labs and images: CBC: Recent Labs  Lab 11/15/19 1110 11/15/19 2333 11/16/19 0256  WBC 8.8 8.4 8.2  NEUTROABS  --  6.0  --   HGB 9.2* 9.1* 9.0*  HCT 29.3* 27.5* 27.0*  MCV 81.2 76.6* 76.9*  PLT 270 261 254   BMP &GFR Recent Labs  Lab 11/15/19 1110 11/15/19 1318 11/16/19 0256  NA 136  --  134*  K 3.9  --  4.3  CL 100  --  101  CO2  22  --  18*  GLUCOSE 91  --  139*  BUN 40*  --  49*  CREATININE 2.56*  --  2.74*  CALCIUM 8.6*  --  8.6*  MG  --  2.1  --    Estimated Creatinine Clearance: 29 mL/min (A) (by C-G formula based on SCr of 2.74 mg/dL (H)). Liver & Pancreas: No results for input(s): AST, ALT, ALKPHOS, BILITOT, PROT, ALBUMIN in the last 168 hours. No results for input(s): LIPASE, AMYLASE in the last 168 hours. No results for input(s): AMMONIA in the last 168 hours. Diabetic: No results for input(s): HGBA1C in the last 72 hours. No results for input(s): GLUCAP in the last 168 hours. Cardiac Enzymes: No results for input(s): CKTOTAL, CKMB, CKMBINDEX, TROPONINI in the last 168 hours. Recent Labs    08/15/19 0949  PROBNP 16,511*   Coagulation Profile: No results for input(s): INR, PROTIME in  the last 168 hours. Thyroid Function Tests: No results for input(s): TSH, T4TOTAL, FREET4, T3FREE, THYROIDAB in the last 72 hours. Lipid Profile: No results for input(s): CHOL, HDL, LDLCALC, TRIG, CHOLHDL, LDLDIRECT in the last 72 hours. Anemia Panel: No results for input(s): VITAMINB12, FOLATE, FERRITIN, TIBC, IRON, RETICCTPCT in the last 72 hours. Urine analysis:    Component Value Date/Time   COLORURINE YELLOW 11/04/2019 1500   APPEARANCEUR CLEAR 11/04/2019 1500   LABSPEC 1.011 11/04/2019 1500   PHURINE 6.0 11/04/2019 1500   GLUCOSEU NEGATIVE 11/04/2019 1500   HGBUR NEGATIVE 11/04/2019 1500   BILIRUBINUR NEGATIVE 11/04/2019 1500   KETONESUR NEGATIVE 11/04/2019 1500   PROTEINUR NEGATIVE 11/04/2019 1500   UROBILINOGEN 0.2 03/23/2013 1740   NITRITE NEGATIVE 11/04/2019 1500   LEUKOCYTESUR NEGATIVE 11/04/2019 1500   Sepsis Labs: Invalid input(s): PROCALCITONIN, [place]  Microbiology: Recent Results (from the past 240 hour(s))  MRSA PCR Screening     Status: None   Collection Time: 11/07/19  9:05 PM   Specimen: Nasal Mucosa; Nasopharyngeal  Result Value Ref Range Status   MRSA by PCR NEGATIVE  NEGATIVE Final    Comment:        The GeneXpert MRSA Assay (FDA approved for NASAL specimens only), is one component of a comprehensive MRSA colonization surveillance program. It is not intended to diagnose MRSA infection nor to guide or monitor treatment for MRSA infections. Performed at [facility], [place] [address], [place], [place] [address]   SARS CORONAVIRUS 2 (TAT 6-24 HRS) Nasopharyngeal Nasopharyngeal Swab     Status: None   Collection Time: 11/15/19  2:18 PM   Specimen: Nasopharyngeal Swab  Result Value Ref Range Status   SARS Coronavirus 2 NEGATIVE NEGATIVE Final    Comment: (NOTE) SARS-CoV-2 target nucleic acids are NOT DETECTED. The SARS-CoV-2 RNA is generally detectable in upper and lower respiratory specimens during the acute phase of infection. Negative results do not preclude SARS-CoV-2 infection, do not rule out co-infections with other pathogens, and should not be used as the sole basis for treatment or other patient management decisions. Negative results must be combined with clinical observations, patient history, and epidemiological information. The expected result is Negative. Fact Sheet for Patients: SugarRoll.be Fact Sheet for Healthcare Providers: https://www.woods-mathews.com/ This test is not yet approved or cleared by the [place] FDA and  has been authorized for detection and/or diagnosis of SARS-CoV-2 by FDA under an Emergency Use Authorization (EUA). This EUA will remain  in effect (meaning this test can be used) for the duration of the COVID-19 declaration under Section 56 4(b)(1) of the Act, 21 U.S.C. section 360bbb-3(b)(1), unless the authorization is terminated or revoked sooner. Performed at [facility], [place] [address], [place], [place] [address]     Radiology Studies: DG Chest Portable 1 View  Result Date: 11/15/2019 CLINICAL DATA:  Shortness of breath and bilateral leg  swelling for a few days. EXAM: PORTABLE CHEST 1 VIEW COMPARISON:  The single-view of the chest 11/04/2019 and 09/28/2019. FINDINGS: There is cardiomegaly mild interstitial edema. Extensive atherosclerosis. No pneumothorax or pleural fluid. No acute bony abnormality. IMPRESSION: Cardiomegaly and interstitial edema. Atherosclerosis. Electronically Signed   By: Inge Rise M.D.   On: 11/15/2019 13:06   VAS [place] LOWER EXTREMITY VENOUS (DVT) ([facility] and [facility] 7a-7p)  Result Date: 11/15/2019  Lower Venous DVTStudy Indications: Edema, and SOB.  Comparison Study: No prior study Performing Technologist: Maudry Mayhew MHA, RDMS, RVT, RDCS  Examination Guidelines: A complete evaluation includes B-mode imaging,  spectral Doppler, color Doppler, and power Doppler as needed of all accessible portions of each vessel. Bilateral testing is considered an integral part of a complete examination. Limited examinations for reoccurring indications may be performed as noted. The reflux portion of the exam is performed with the patient in reverse Trendelenburg.  +---------+---------------+---------+-----------+----------+--------------+  RIGHT     Compressibility Phasicity Spontaneity Properties Thrombus Aging  +---------+---------------+---------+-----------+----------+--------------+  CFV       Full            Yes       Yes                                    +---------+---------------+---------+-----------+----------+--------------+  SFJ       Full                                                             +---------+---------------+---------+-----------+----------+--------------+  FV Prox   Full                                                             +---------+---------------+---------+-----------+----------+--------------+  FV Mid    Full                                                             +---------+---------------+---------+-----------+----------+--------------+  FV Distal Full                                                              +---------+---------------+---------+-----------+----------+--------------+  PFV       Full                                                             +---------+---------------+---------+-----------+----------+--------------+  POP       Full            Yes       Yes                                    +---------+---------------+---------+-----------+----------+--------------+  PTV       Full                                                             +---------+---------------+---------+-----------+----------+--------------+  PERO      Full                                                             +---------+---------------+---------+-----------+----------+--------------+   +---------+---------------+---------+-----------+----------+--------------+  LEFT      Compressibility Phasicity Spontaneity Properties Thrombus Aging  +---------+---------------+---------+-----------+----------+--------------+  CFV       Full            Yes       Yes                                    +---------+---------------+---------+-----------+----------+--------------+  SFJ       Full                                                             +---------+---------------+---------+-----------+----------+--------------+  FV Prox   Full                                                             +---------+---------------+---------+-----------+----------+--------------+  FV Mid    Full                                                             +---------+---------------+---------+-----------+----------+--------------+  FV Distal Full                                                             +---------+---------------+---------+-----------+----------+--------------+  PFV       Full                                                             +---------+---------------+---------+-----------+----------+--------------+  POP       Full            Yes       Yes                                     +---------+---------------+---------+-----------+----------+--------------+  PTV       Full                                                             +---------+---------------+---------+-----------+----------+--------------+  PERO      Full                                                             +---------+---------------+---------+-----------+----------+--------------+     Summary: RIGHT: - There is no evidence of deep vein thrombosis in the lower extremity.  - No cystic structure found in the popliteal fossa. - Ultrasound characteristics of enlarged lymph nodes are noted in the groin.  LEFT: - There is no evidence of deep vein thrombosis in the lower extremity.  - No cystic structure found in the popliteal fossa.  Pulstaile venous flow is noted bilaterally, suggestive of possible elevated right heart pressure.  *See table(s) above for measurements and observations.    Preliminary     45 minutes with more than 50% spent in reviewing records, counseling patient/family and coordinating care.  Orean Giarratano T. [place]  If 7PM-7AM, please contact night-coverage [email or web address] Password [email or web address] 11/16/2019, 12:00 PM

## 2019-11-17 LAB — HEMOGLOBIN A1C
Hgb A1c MFr Bld: 5.7 % — ABNORMAL HIGH (ref 4.8–5.6)
Mean Plasma Glucose: 116.89 mg/dL

## 2019-11-17 LAB — CBC
HCT: 28 % — ABNORMAL LOW (ref 39.0–52.0)
Hemoglobin: 9.4 g/dL — ABNORMAL LOW (ref 13.0–17.0)
MCH: 25.6 pg — ABNORMAL LOW (ref 26.0–34.0)
MCHC: 33.6 g/dL (ref 30.0–36.0)
MCV: 76.3 fL — ABNORMAL LOW (ref 80.0–100.0)
Platelets: 280 10*3/uL (ref 150–400)
RBC: 3.67 MIL/uL — ABNORMAL LOW (ref 4.22–5.81)
RDW: 20.8 % — ABNORMAL HIGH (ref 11.5–15.5)
WBC: 8.9 10*3/uL (ref 4.0–10.5)
nRBC: 0.3 % — ABNORMAL HIGH (ref 0.0–0.2)

## 2019-11-17 LAB — LIPID PANEL
Cholesterol: 66 mg/dL (ref 0–200)
HDL: 27 mg/dL — ABNORMAL LOW (ref >40)
LDL Cholesterol: 34 mg/dL (ref 0–99)
Total CHOL/HDL Ratio: 2.4 RATIO
Triglycerides: 27 mg/dL (ref <150)
VLDL: 5 mg/dL (ref 0–40)

## 2019-11-17 LAB — RENAL FUNCTION PANEL
Albumin: 3 g/dL — ABNORMAL LOW (ref 3.5–5.0)
Anion gap: 14 (ref 5–15)
BUN: 50 mg/dL — ABNORMAL HIGH (ref 8–23)
CO2: 20 mmol/L — ABNORMAL LOW (ref 22–32)
Calcium: 8.8 mg/dL — ABNORMAL LOW (ref 8.9–10.3)
Chloride: 100 mmol/L (ref 98–111)
Creatinine, Ser: 2.43 mg/dL — ABNORMAL HIGH (ref 0.61–1.24)
GFR calc Af Amer: 29 mL/min — ABNORMAL LOW (ref >60)
GFR calc non Af Amer: 25 mL/min — ABNORMAL LOW (ref >60)
Glucose, Bld: 128 mg/dL — ABNORMAL HIGH (ref 70–99)
Phosphorus: 4.3 mg/dL (ref 2.5–4.6)
Potassium: 4 mmol/L (ref 3.5–5.1)
Sodium: 134 mmol/L — ABNORMAL LOW (ref 135–145)

## 2019-11-17 LAB — MAGNESIUM: Magnesium: 2.1 mg/dL (ref 1.7–2.4)

## 2019-11-17 LAB — HEPARIN LEVEL (UNFRACTIONATED)
Heparin Unfractionated: 0.75 IU/mL — ABNORMAL HIGH (ref 0.30–0.70)
Heparin Unfractionated: 0.78 IU/mL — ABNORMAL HIGH (ref 0.30–0.70)

## 2019-11-17 MED ORDER — FUROSEMIDE 10 MG/ML IJ SOLN
100.0000 mg | Freq: Once | INTRAVENOUS | Status: AC
  Administered 2019-11-17: 100 mg via INTRAVENOUS
  Filled 2019-11-17: qty 10

## 2019-11-17 NOTE — Progress Notes (Signed)
Progress Note  Patient Name: Logan White Date of Encounter: 11/17/2019  Primary Cardiologist: Kirk Ruths, MD   Subjective  ARTHOR GORTER is a 79 y.o. male with a hx of permanent Afib, CAD, Vfib arrest, ICM and CHFwho is being seen today for the evaluation of CHF at the request of Dr. Cyndia Skeeters.  Feels he is still short of breath and his right leg feels cold hanging off of side of bed. We discussed his peripheral arterial disease and plan for this week.  Inpatient Medications    Scheduled Meds: . atorvastatin  80 mg Oral Daily  . metoprolol succinate  50 mg Oral Daily  . mometasone-formoterol  2 puff Inhalation BID  . pantoprazole  40 mg Oral BID  . potassium chloride SA  20 mEq Oral BID  . sodium chloride flush  3 mL Intravenous Q12H   Continuous Infusions: . sodium chloride    . heparin 1,100 Units/hr (11/17/19 0844)   PRN Meds: sodium chloride, acetaminophen, albuterol, fentaNYL (SUBLIMAZE) injection, ondansetron (ZOFRAN) IV, sodium chloride flush   Vital Signs    Vitals:   11/16/19 2005 11/17/19 0521 11/17/19 0718 11/17/19 0843  BP: 113/74 104/68  123/81  Pulse: 95 70  65  Resp: 18 20    Temp: 99.1 F (37.3 C) 98.1 F (36.7 C)    TempSrc: Oral Oral    SpO2: 100% 100% 97% 99%  Weight:  88.4 kg    Height:        Intake/Output Summary (Last 24 hours) at 11/17/2019 0944 Last data filed at 11/17/2019 0847 Gross per 24 hour  Intake 1245.48 ml  Output 1700 ml  Net -454.52 ml   Last 3 Weights 11/17/2019 11/16/2019 11/15/2019  Weight (lbs) 194 lb 12.8 oz 196 lb 12.8 oz 194 lb 1.6 oz  Weight (kg) 88.361 kg 89.268 kg 88.043 kg  Some encounter information is confidential and restricted. Go to Review Flowsheets activity to see all data.      Telemetry    PAF. PVCs and 7 beat run of NSVT. - Personally Reviewed  ECG    No new- Personally Reviewed  Physical Exam   GEN: No acute distress.   Neck: No JVD Cardiac: iRRR, no murmurs, rubs, or gallops.   Respiratory: expiratory wheeze and coughing GI: Soft, nontender, non-distended  MS: trace bilateral edema; No deformity. Neuro:  Nonfocal  Psych: Normal affect   Labs    High Sensitivity Troponin:   Recent Labs  Lab 11/15/19 1318 11/15/19 1430  TROPONINIHS 7 8      Chemistry Recent Labs  Lab 11/15/19 1110 11/16/19 0256 11/17/19 0448  NA 136 134* 134*  K 3.9 4.3 4.0  CL 100 101 100  CO2 22 18* 20*  GLUCOSE 91 139* 128*  BUN 40* 49* 50*  CREATININE 2.56* 2.74* 2.43*  CALCIUM 8.6* 8.6* 8.8*  ALBUMIN  --   --  3.0*  GFRNONAA 23* 22* 25*  GFRAA 27* 25* 29*  ANIONGAP 14 15 14      Hematology Recent Labs  Lab 11/15/19 2333 11/15/19 2333 11/16/19 0256 11/16/19 1501 11/17/19 0448  WBC 8.4  --  8.2  --  8.9  RBC 3.59*   < > 3.51* 3.83* 3.67*  HGB 9.1*  --  9.0*  --  9.4*  HCT 27.5*  --  27.0*  --  28.0*  MCV 76.6*  --  76.9*  --  76.3*  MCH 25.3*  --  25.6*  --  25.6*  MCHC 33.1  --  33.3  --  33.6  RDW 20.7*  --  21.1*  --  20.8*  PLT 261  --  254  --  280   < > = values in this interval not displayed.    BNP Recent Labs  Lab 11/15/19 1110  BNP 2,374.7*     DDimer No results for input(s): DDIMER in the last 168 hours.   Radiology    DG Chest Portable 1 View  Result Date: 11/15/2019 CLINICAL DATA:  Shortness of breath and bilateral leg swelling for a few days. EXAM: PORTABLE CHEST 1 VIEW COMPARISON:  The single-view of the chest 11/04/2019 and 09/28/2019. FINDINGS: There is cardiomegaly mild interstitial edema. Extensive atherosclerosis. No pneumothorax or pleural fluid. No acute bony abnormality. IMPRESSION: Cardiomegaly and interstitial edema. Atherosclerosis. Electronically Signed   By: Inge Rise M.D.   On: 11/15/2019 13:06   VAS [place] LOWER EXTREMITY VENOUS (DVT) (MC and WL 7a-7p)  Result Date: 11/16/2019  Lower Venous DVTStudy Indications: Edema, and SOB.  Comparison Study: No prior study Performing Technologist: Maudry Mayhew MHA, RDMS,  RVT, RDCS  Examination Guidelines: A complete evaluation includes B-mode imaging, spectral Doppler, color Doppler, and power Doppler as needed of all accessible portions of each vessel. Bilateral testing is considered an integral part of a complete examination. Limited examinations for reoccurring indications may be performed as noted. The reflux portion of the exam is performed with the patient in reverse Trendelenburg.  +---------+---------------+---------+-----------+----------+--------------+ RIGHT    CompressibilityPhasicitySpontaneityPropertiesThrombus Aging +---------+---------------+---------+-----------+----------+--------------+ CFV      Full           Yes      Yes                                 +---------+---------------+---------+-----------+----------+--------------+ SFJ      Full                                                        +---------+---------------+---------+-----------+----------+--------------+ FV Prox  Full                                                        +---------+---------------+---------+-----------+----------+--------------+ FV Mid   Full                                                        +---------+---------------+---------+-----------+----------+--------------+ FV DistalFull                                                        +---------+---------------+---------+-----------+----------+--------------+ PFV      Full                                                        +---------+---------------+---------+-----------+----------+--------------+  POP      Full           Yes      Yes                                 +---------+---------------+---------+-----------+----------+--------------+ PTV      Full                                                        +---------+---------------+---------+-----------+----------+--------------+ PERO     Full                                                         +---------+---------------+---------+-----------+----------+--------------+   +---------+---------------+---------+-----------+----------+--------------+ LEFT     CompressibilityPhasicitySpontaneityPropertiesThrombus Aging +---------+---------------+---------+-----------+----------+--------------+ CFV      Full           Yes      Yes                                 +---------+---------------+---------+-----------+----------+--------------+ SFJ      Full                                                        +---------+---------------+---------+-----------+----------+--------------+ FV Prox  Full                                                        +---------+---------------+---------+-----------+----------+--------------+ FV Mid   Full                                                        +---------+---------------+---------+-----------+----------+--------------+ FV DistalFull                                                        +---------+---------------+---------+-----------+----------+--------------+ PFV      Full                                                        +---------+---------------+---------+-----------+----------+--------------+ POP      Full           Yes      Yes                                 +---------+---------------+---------+-----------+----------+--------------+  PTV      Full                                                        +---------+---------------+---------+-----------+----------+--------------+ PERO     Full                                                        +---------+---------------+---------+-----------+----------+--------------+     Summary: RIGHT: - There is no evidence of deep vein thrombosis in the lower extremity.  - No cystic structure found in the popliteal fossa. - Ultrasound characteristics of enlarged lymph nodes are noted in the groin.  LEFT: - There is no evidence of deep vein thrombosis  in the lower extremity.  - No cystic structure found in the popliteal fossa.  Pulstaile venous flow is noted bilaterally, suggestive of possible elevated right heart pressure. *See table(s) above for measurements and observations. Electronically signed by Harold Barban MD on 11/16/2019 at 24:07:56 PM.    Final     Cardiac Studies   Relevant CV Studies: RHC:  Conclusion   Moderate pulmonary hypertension with mean PA pressure of 42 mmHg.  Mean pulmonary capillary wedge pressure 28 mmHg.  Phasic pulmonary artery pressure 63/29 mmHg, mean RA pressure 20 mmHg, and pulmonary artery pulsatility index is 1.7 (suggesting RV dysfunction)  Mixed venous O2 saturation 57%   ECHO: 09/29/19   1. Left ventricular ejection fraction, by visual estimation, is 25 to  30%. The left ventricle has severely decreased function. There is mildly  increased left ventricular hypertrophy.  2. Left ventricular diastolic function could not be evaluated.  3. Severely dilated left ventricular internal cavity size.  4. The left ventricle demonstrates global hypokinesis.  5. Global right ventricle has severely reduced systolic function.The  right ventricular size is mildly enlarged.  6. Left atrial size was severely dilated.  7. The interatrial septum is aneurysmal.  8. Right atrial size was severely dilated.  9. The mitral valve is normal in structure. Moderate mitral valve  regurgitation. No evidence of mitral stenosis.  10. The tricuspid valve is normal in structure. Tricuspid valve  regurgitation is mild.  11. The aortic valve is tricuspid. Aortic valve regurgitation is mild.  Mild to moderate aortic valve sclerosis/calcification without any evidence  of aortic stenosis.  12. The pulmonic valve was normal in structure. Pulmonic valve  regurgitation is mild.  13. Moderately elevated pulmonary artery systolic pressure.  14. The inferior vena cava is dilated in size with <50% respiratory   variability, suggesting right atrial pressure of 15 mmHg.  15. Severe global reduction in LV systolic function; mild LVH; 4 chamber  enlargement; severe RV dysfunction; mild AI; moderate MR; mild TR;  moderate pulmonary hypertension.    Patient Profile     MEZIAH BLASINGAME is a 79 y.o. male with a hx of permanent Afib, CAD, Vfib arrest, ICM and CHFwho is being seen today for the evaluation of CHF at the request of Dr. Cyndia Skeeters.  Assessment & Plan    1. SOB/DOE/acute on chronic systolic congestive heart failure- - negative 2 L for admission, weight unchanged, UOP yesterday 1.2 L. - will give lasix  100 mg IV once today, he is having moderate diuresis and creatinine improved, likely an element of congestion. Reassess daily.  - SOB not significantly improved, consider primary lung component.  2.  Chronic kidney disease, baseline creatinine appears to be around 1.9 from late 2020, and closer to 1.5-1.6 in 2019-2020.   - Creatinine today is 2.43.  Improved.  3.  Right lower extremity pain and numbness-seen by Dr. Trula Slade this hospitalization, planning for angiogram Tuesday.   4.  Anemia-it was recommended at last hospitalization to discontinue aspirin indefinitely.  IV heparin on board. No bleeding noted.  5.  Primary atrial fibrillation-continue beta-blocker. IV heparin.   6.  Mitral regurgitation-moderate on recent echo  7.  Ischemic cardiomyopathy-continue beta-blocker.  Unable to use ARB or Entresto given renal insufficiency.        For questions or updates, please contact [place] Please consult [email or web address] for contact info under        Signed, Elouise Munroe, MD  11/17/2019, 9:44 AM

## 2019-11-17 NOTE — Progress Notes (Signed)
PROGRESS NOTE  Logan White PIR:[id number] DOB: Jun 16, 1941   PCP: Patient, No Pcp Per  Patient is from: Home.  Lives with his wife.  Uses walker and cane at home.  DOA: 11/15/2019 LOS: 2  Brief Narrative / Interim history: 79 year old male with history of A. fib on Eliquis, COPD/chronic RF on 2 L by [place], PVD and AAA followed by Dr. Gwenlyn Found, CVA, DM-2, CAD s/p stent, YTK-1S and systolic CHF (EF 25 to 01%) presenting with BLE pain and numbness, Rt > Lt, and shortness of breath, and admitted for limb threatening ischemia/PAD and acute on chronic systolic CHF and AKI on UXN-2T.  CXR with pulmonary edema.  Evaluated by vascular surgery who is planning angiogram once renal function improves for revascularization next week.  Cardiology consulted for CHF exacerbation.  11/17/2019: Patient seen.  No new changes.  Input from cardiology and vascular surgery team appreciated.  Heparin drip discontinued.  Serum creatinine today is 2.43.  Vascular surgery team plans to proceed with vascular work-up in 2 days time.    Subjective: No new complaints Right lower extremity pain and numbness persist.  Objective: Vitals:   11/17/19 0521 11/17/19 0718 11/17/19 0843 11/17/19 1148  BP: 104/68  123/81 122/74  Pulse: 70  65 73  Resp: 20     Temp: 98.1 F (36.7 C)     TempSrc: Oral     SpO2: 100% 97% 99% 99%  Weight: 88.4 kg     Height:        Intake/Output Summary (Last 24 hours) at 11/17/2019 1810 Last data filed at 11/17/2019 1656 Gross per 24 hour  Intake 968.3 ml  Output 1800 ml  Net -831.7 ml   Filed Weights   11/15/19 1717 11/16/19 0333 11/17/19 0521  Weight: 88 kg 89.3 kg 88.4 kg    Examination:  GENERAL: No acute distress.    HEENT: Mild pallor.  No jaundice. NECK: Supple.  No apparent JVD.  RESP: Decreased air entry.  Query wheezing from upper airways. CVS: F5-D3, ejection systolic murmur, irregular. ABD/GI/GU: Bowel sounds present. Soft. Non tender.  EXT: Mild leg  edema. NEURO: Awake, alert and oriented fairly.  Patient moves all extremities..    Procedures:  None  Assessment & Plan: PVD/limb threatening ischemia: Followed by Dr. Gwenlyn Found -Per DDS, plan for angiogram and revascularization once renal function improves. -Hold Eliquis.  Continue IV heparin, statin and aspirin.  -Check lipid panel -Consult palliative care 11/17/2019: Patient remains on heparin drip.  For possible further vascular work-up in 2 days time.  Acute on chronic systolic CHF/RV failure: Echo on 09/29/2019 with EF of 25 to 22%, unknown diastolic function, global hypokinesis, severely reduced RVSF, severe LAE/RAE, interatrial septum aneurysm, and moderate pulm HTN.  RHC in 1/21 with pulmonary hypertension and RV dysfunction.  CXR concerning for pulmonary edema.  BNP 2400 (1600 two weeks ago).  No crackles on lung exam.  Only 300 cc urine output charted.  Creatinine trended up.  Soft blood pressures. -Decrease metoprolol and hold hydralazine and Imdur for better renal perfusion -Continue torsemide 60 mg twice daily-home dose -Consult cardiology -Monitor fluid status, renal function and electrolytes 11/17/2019: Stable.  Cardiology team is directing care.    AKI on CKD-3B: Cr 2.06 (on 3/5)> 2.56 (admit)> 2.74.  BUN 40 (admit)> 49.  Likely cardiorenal due to CHF.  -Adjusted cardiac meds as above. -Continue monitoring 11/17/2019: AKI is improving.  Serum creatinine today is 2.43.  Persistent Afib: On high-dose metoprolol and Eliquis -Reduce metoprolol the  setting of soft blood pressures -Eliquis on hold for anticipated revascularization of PAD -IV heparin for anticoagulation  DM-2 with CKD-3b: Blood glucose was in fair range.  Does not seem to be on medication for this. -Check hemoglobin A1c and lipid panel.  Hypertension: Soft blood pressures elevated this morning but normotensive now. -Reduce metoprolol, and discontinue hydralazine and Imdur  Chronic respiratory failure: On 2  L at baseline. Chronic COPD -Wean oxygen as able -Continue Dulera and as needed albuterol  HLD -Lipid panel -Continue Lipitor  CAD s/p DES to LAD in 2004, PCI in 2014.  No chest pain. -Continue BB, statin and aspirin.  AAA: 3.0, stable, and unchanged on [place] on 03/15/19 -Continue statin and aspirin   Debility/physical deconditioning: Uses walker at baseline. -PT/OT   Goal of care: Patient with multiple comorbidities as above.  He is full code as of now which I believe would pose more harm than benefit. -Consult palliative care  Nutrition: -Consult dietitian    DVT prophylaxis: On heparin drip Code Status: Full code Family Communication: I updated patient's wife over the phone.  Discharge barrier: Evaluation and treatment of limb threatening ischemia, CHF exacerbation and AKI Patient is from: Home Final disposition: To be determined  Consultants: VVS, cardiology, palliative care   Microbiology summarized: COVID-19 screen negative.  Sch Meds:  Scheduled Meds: . atorvastatin  80 mg Oral Daily  . metoprolol succinate  50 mg Oral Daily  . mometasone-formoterol  2 puff Inhalation BID  . pantoprazole  40 mg Oral BID  . potassium chloride SA  20 mEq Oral BID  . sodium chloride flush  3 mL Intravenous Q12H   Continuous Infusions: . sodium chloride    . heparin 1,100 Units/hr (11/17/19 1147)   PRN Meds:.sodium chloride, acetaminophen, albuterol, fentaNYL (SUBLIMAZE) injection, ondansetron (ZOFRAN) IV, sodium chloride flush  Antimicrobials: Anti-infectives (From admission, onward)   None       I have personally reviewed the following labs and images: CBC: Recent Labs  Lab 11/15/19 1110 11/15/19 2333 11/16/19 0256 11/17/19 0448  WBC 8.8 8.4 8.2 8.9  NEUTROABS  --  6.0  --   --   HGB 9.2* 9.1* 9.0* 9.4*  HCT 29.3* 27.5* 27.0* 28.0*  MCV 81.2 76.6* 76.9* 76.3*  PLT 270 261 254 280   BMP &GFR Recent Labs  Lab 11/15/19 1110 11/15/19 1318 11/16/19 0256  11/17/19 0448  NA 136  --  134* 134*  K 3.9  --  4.3 4.0  CL 100  --  101 100  CO2 22  --  18* 20*  GLUCOSE 91  --  139* 128*  BUN 40*  --  49* 50*  CREATININE 2.56*  --  2.74* 2.43*  CALCIUM 8.6*  --  8.6* 8.8*  MG  --  2.1  --  2.1  PHOS  --   --   --  4.3   Estimated Creatinine Clearance: 32.3 mL/min (A) (by C-G formula based on SCr of 2.43 mg/dL (H)). Liver & Pancreas: Recent Labs  Lab 11/17/19 0448  ALBUMIN 3.0*   No results for input(s): LIPASE, AMYLASE in the last 168 hours. No results for input(s): AMMONIA in the last 168 hours. Diabetic: Recent Labs    11/17/19 0448  HGBA1C 5.7*   No results for input(s): GLUCAP in the last 168 hours. Cardiac Enzymes: No results for input(s): CKTOTAL, CKMB, CKMBINDEX, TROPONINI in the last 168 hours. Recent Labs    08/15/19 0949  PROBNP 16,511*   Coagulation  Profile: No results for input(s): INR, PROTIME in the last 168 hours. Thyroid Function Tests: No results for input(s): TSH, T4TOTAL, FREET4, T3FREE, THYROIDAB in the last 72 hours. Lipid Profile: Recent Labs    11/17/19 0448  CHOL 66  HDL 27*  LDLCALC 34  TRIG 27  CHOLHDL 2.4   Anemia Panel: Recent Labs    11/16/19 1501  VITAMINB12 683  FOLATE 14.8  FERRITIN 43  TIBC 447  IRON 23*  RETICCTPCT 2.8   Urine analysis:    Component Value Date/Time   COLORURINE YELLOW 11/04/2019 1500   APPEARANCEUR CLEAR 11/04/2019 1500   LABSPEC 1.011 11/04/2019 1500   PHURINE 6.0 11/04/2019 1500   GLUCOSEU NEGATIVE 11/04/2019 1500   HGBUR NEGATIVE 11/04/2019 1500   BILIRUBINUR NEGATIVE 11/04/2019 1500   KETONESUR NEGATIVE 11/04/2019 1500   PROTEINUR NEGATIVE 11/04/2019 1500   UROBILINOGEN 0.2 03/23/2013 1740   NITRITE NEGATIVE 11/04/2019 1500   LEUKOCYTESUR NEGATIVE 11/04/2019 1500   Sepsis Labs: Invalid input(s): PROCALCITONIN, [place]  Microbiology: Recent Results (from the past 240 hour(s))  MRSA PCR Screening     Status: None   Collection Time:  11/07/19  9:05 PM   Specimen: Nasal Mucosa; Nasopharyngeal  Result Value Ref Range Status   MRSA by PCR NEGATIVE NEGATIVE Final    Comment:        The GeneXpert MRSA Assay (FDA approved for NASAL specimens only), is one component of a comprehensive MRSA colonization surveillance program. It is not intended to diagnose MRSA infection nor to guide or monitor treatment for MRSA infections. Performed at [facility], [place] [address] [place], [place] [address]   SARS CORONAVIRUS 2 (TAT 6-24 HRS) Nasopharyngeal Nasopharyngeal Swab     Status: None   Collection Time: 11/15/19  2:18 PM   Specimen: Nasopharyngeal Swab  Result Value Ref Range Status   SARS Coronavirus 2 NEGATIVE NEGATIVE Final    Comment: (NOTE) SARS-CoV-2 target nucleic acids are NOT DETECTED. The SARS-CoV-2 RNA is generally detectable in upper and lower respiratory specimens during the acute phase of infection. Negative results do not preclude SARS-CoV-2 infection, do not rule out co-infections with other pathogens, and should not be used as the sole basis for treatment or other patient management decisions. Negative results must be combined with clinical observations, patient history, and epidemiological information. The expected result is Negative. Fact Sheet for Patients: SugarRoll.be Fact Sheet for Healthcare Providers: https://www.woods-mathews.com/ This test is not yet approved or cleared by the [place] FDA and  has been authorized for detection and/or diagnosis of SARS-CoV-2 by FDA under an Emergency Use Authorization (EUA). This EUA will remain  in effect (meaning this test can be used) for the duration of the COVID-19 declaration under Section 56 4(b)(1) of the Act, 21 U.S.C. section 360bbb-3(b)(1), unless the authorization is terminated or revoked sooner. Performed at [facility], [place] [address], [place], [place] [address]     Radiology  Studies: No results found.  Time spent: 35 minutes Dana Allan, MD [facility]  If 7PM-7AM, please contact night-coverage [email or web address] Password [email or web address] 11/17/2019, 6:10 PM

## 2019-11-17 NOTE — Progress Notes (Signed)
ANTICOAGULATION CONSULT NOTE  Pharmacy Consult for heparin Indication: AFib/PVD  No Known Allergies  Patient Measurements: Height: 6\' 6"  (198.1 cm) Weight: 194 lb 12.8 oz (88.4 kg) IBW/kg (Calculated) : 91.4 Heparin Dosing Weight: 88kg  Vital Signs: BP: 122/74 (03/14 1148) Pulse Rate: 73 (03/14 1148)  Labs: Recent Labs    11/15/19 1110 11/15/19 1110 11/15/19 1318 11/15/19 1430 11/15/19 2333 11/15/19 2333 11/16/19 0256 11/16/19 0256 11/16/19 1816 11/17/19 0448 11/17/19 1555  HGB 9.2*   < >  --   --  9.1*   < > 9.0*  --   --  9.4*  --   HCT 29.3*   < >  --   --  27.5*  --  27.0*  --   --  28.0*  --   PLT 270   < >  --   --  261  --  254  --   --  280  --   HEPARINUNFRC  --   --   --   --   --   --  0.51   < > 0.70 0.75* 0.78*  CREATININE 2.56*  --   --   --   --   --  2.74*  --   --  2.43*  --   TROPONINIHS  --   --  7 8  --   --   --   --   --   --   --    < > = values in this interval not displayed.    Estimated Creatinine Clearance: 32.3 mL/min (A) (by C-G formula based on SCr of 2.43 mg/dL (H)).  Assessment: 79 y.o. male with h/o Afib. Eliquis on hold since discharge of previous admission on 3/5, for heparin.  Heparin level remains elevated 0.7 despite heparin drip rate decreased to 1100 units/hr. CBC stable. No overt bleeding noted.  Goal of Therapy:  Heparin level 0.3-0.7 units/ml Monitor platelets by anticoagulation protocol: Yes   Plan:  Decrease heparin drip 900 uts/hr  Daily HL, CBC  Monitor s/s bleeding   Bonnita Nasuti Pharm.D. CPP, BCPS Clinical Pharmacist [phone] 11/17/2019 7:29 PM    Please check [email or web address] for unit-specific pharmacy phone numbers.

## 2019-11-17 NOTE — Progress Notes (Signed)
    Subjective  -   Upset about DNR conversations yesterday as well as communication with his wife  States right leg feels better   Physical Exam:  Sensation improved to right leg, appears better perfused Abd soft Neuro intact       Assessment/Plan:    Continue heparin drip  Plan for angiogram on Tuesday  He is having some shortness of breath, will have cardiology evaluate tomorrow  Annamarie Major 11/17/2019 8:33 AM --  Vitals:   11/17/19 0521 11/17/19 0718  BP: 104/68   Pulse: 70   Resp: 20   Temp: 98.1 F (36.7 C)   SpO2: 100% 97%    Intake/Output Summary (Last 24 hours) at 11/17/2019 0833 Last data filed at 11/17/2019 0304 Gross per 24 hour  Intake 1184.11 ml  Output 1200 ml  Net -15.89 ml     Laboratory CBC    Component Value Date/Time   WBC 8.9 11/17/2019 0448   HGB 9.4 (L) 11/17/2019 0448   HGB 12.7 (L) 07/29/2019 1004   HCT 28.0 (L) 11/17/2019 0448   HCT 38.3 07/29/2019 1004   PLT 280 11/17/2019 0448   PLT 191 07/29/2019 1004    BMET    Component Value Date/Time   NA 134 (L) 11/17/2019 0448   NA 139 10/29/2019 1139   K 4.0 11/17/2019 0448   CL 100 11/17/2019 0448   CO2 20 (L) 11/17/2019 0448   GLUCOSE 128 (H) 11/17/2019 0448   BUN 50 (H) 11/17/2019 0448   BUN 37 (H) 10/29/2019 1139   CREATININE 2.43 (H) 11/17/2019 0448   CREATININE 1.34 (H) 10/31/2016 0844   CALCIUM 8.8 (L) 11/17/2019 0448   GFRNONAA 25 (L) 11/17/2019 0448   GFRNONAA 46 (L) 10/24/2014 0943   GFRAA 29 (L) 11/17/2019 0448   GFRAA 53 (L) 10/24/2014 0943    COAG Lab Results  Component Value Date   INR 2.5 (H) 09/30/2019   INR 1.5 (H) 06/22/2019   INR 1.1 (A) 05/04/2018   No results found for: PTT  Antibiotics Anti-infectives (From admission, onward)   None       V. Leia Alf, M.D., [facility] [place] Office: [phone] Pager:  [phone]

## 2019-11-17 NOTE — Progress Notes (Signed)
Per CCMD, pt had a 7 beat run of wide QRS. Pt asymptomatic, resting comfortably in bed. No complains at this time. Cardiology on call notified. Will monitor.

## 2019-11-17 NOTE — Evaluation (Signed)
Occupational Therapy Evaluation Patient Details Name: Logan White MRN: [id number] DOB: 1941/09/05 Today's Date: 11/17/2019    History of Present Illness 79 y.o. male with medical history significant of afib on Coumadin; PVD; HLD; h/o CVA; DM; CAD s/p stent; stage 4 CKD; and AAA presenting with B leg pain.  He reports that he is here due to his R > L legs - pain, they go numb on him. Pt with recent admission 3/1-3/5 for RLE weakness, CHF exacerbation, and anemia. Pt scheduled for aortogram monday with pending intervention following per vascular surgery.   Clinical Impression   This 79 y/o male presents with the above. PTA pt reports independence with ADL and functional mobility, reports intermittent use of SPC. Pt currently presenting with decreased activity tolerance and increased DOE with activity. Pt tolerating room and hallway level mobility using RW with minguard-minA throughout. Pt requiring x1 standing rest break with hallway level activity, and with 3/4 DOE noted after return to room/return to sitting. Pt on 2L O2 with SpO2 >/=95% throughout. He reports lives with spouse and son who can assist with ADL PRN after return home. He will benefit from continued acute OT services to maximize his overall safety and independence with ADL and mobility prior to return home. Will follow.     Follow Up Recommendations  Supervision/Assistance - 24 hour;No OT follow up(24hr initially)    Equipment Recommendations  Tub/shower seat           Precautions / Restrictions Precautions Precautions: Fall Precaution Comments:  R LE numbness/heaviness with ambulation Restrictions Weight Bearing Restrictions: No      Mobility Bed Mobility               General bed mobility comments: pt laying perpendicular in bed upon arrival, feet hanging over edge resting on floor and pt laying back onto bed, able to transition to sitting without assist, states he meant to lay this way, that it was  comfortable  Transfers Overall transfer level: Needs assistance Equipment used: Rolling walker (2 wheeled) Transfers: Sit to/from Stand Sit to Stand: Min guard         General transfer comment: for safety    Balance Overall balance assessment: Needs assistance Sitting-balance support: No upper extremity supported;Feet supported Sitting balance-Leahy Scale: Normal       Standing balance-Leahy Scale: Fair Standing balance comment: supervision with unilateral UE support of RW                           ADL either performed or assessed with clinical judgement   ADL Overall ADL's : Needs assistance/impaired Eating/Feeding: Modified independent;Sitting   Grooming: Set up;Sitting   Upper Body Bathing: Set up;Sitting   Lower Body Bathing: Minimal assistance;Sit to/from stand   Upper Body Dressing : Set up;Sitting   Lower Body Dressing: Minimal assistance;Sit to/from stand   Toilet Transfer: Min guard;Minimal assistance;Ambulation;RW   Toileting- Water quality scientist and Hygiene: Min guard;Sit to/from stand;Sitting/lateral lean       Functional mobility during ADLs: Minimal assistance;Rolling walker General ADL Comments: pt with increased DOE with room/hallway level mobility, requires seated and standing rest breaks                         Pertinent Vitals/Pain Pain Assessment: No/denies pain     Hand Dominance Right   Extremity/Trunk Assessment Upper Extremity Assessment Upper Extremity Assessment: Overall WFL for tasks assessed   Lower Extremity  Assessment Lower Extremity Assessment: Defer to PT evaluation   Cervical / Trunk Assessment Cervical / Trunk Assessment: Normal   Communication Communication Communication: No difficulties   Cognition Arousal/Alertness: Awake/alert Behavior During Therapy: WFL for tasks assessed/performed Overall Cognitive Status: Within Functional Limits for tasks assessed                                      General Comments       Exercises     Shoulder Instructions      Home Living Family/patient expects to be discharged to:: Private residence Living Arrangements: Spouse/significant other Available Help at Discharge: Family;Available 24 hours/day Type of Home: Apartment Home Access: Level entry     Home Layout: One level     Bathroom Shower/Tub: Teacher, early years/pre: Standard     Home Equipment: Cane - single point          Prior Functioning/Environment Level of Independence: Independent with assistive device(s)        Comments: uses cane, reports he doesn't drive and uses power buggy at grocery store        OT Problem List: Decreased activity tolerance;Impaired balance (sitting and/or standing);Decreased knowledge of use of DME or AE;Decreased knowledge of precautions;Cardiopulmonary status limiting activity      OT Treatment/Interventions: Self-care/ADL training;Therapeutic exercise;Energy conservation;DME and/or AE instruction;Therapeutic activities;Patient/family education;Balance training    OT Goals(Current goals can be found in the care plan section) Acute Rehab OT Goals Patient Stated Goal: To reduce pain in RLE OT Goal Formulation: With patient Time For Goal Achievement: 12/01/19 Potential to Achieve Goals: Good  OT Frequency: Min 2X/week   Barriers to D/C:            Co-evaluation              AM-PAC OT "6 Clicks" Daily Activity     Outcome Measure Help from another person eating meals?: None Help from another person taking care of personal grooming?: None Help from another person toileting, which includes using toliet, bedpan, or urinal?: A Little Help from another person bathing (including washing, rinsing, drying)?: A Little Help from another person to put on and taking off regular upper body clothing?: None Help from another person to put on and taking off regular lower body clothing?: A Little 6 Click  Score: 21   End of Session Equipment Utilized During Treatment: Gait belt;Rolling walker;Oxygen Nurse Communication: Mobility status  Activity Tolerance: Patient tolerated treatment well Patient left: with call bell/phone within reach;Other (comment)(seated EOB)  OT Visit Diagnosis: Unsteadiness on feet (R26.81);Other (comment)(decreased activity tolerance)                Time: 4742-5956 OT Time Calculation (min): 21 min Charges:  OT General Charges $OT Visit: 1 Visit OT Evaluation $OT Eval Moderate Complexity: [place], OT Acute Rehabilitation Services Pager [phone] Office [place] 11/17/2019, 4:49 PM

## 2019-11-17 NOTE — H&P (View-Only) (Signed)
    Subjective  -   Upset about DNR conversations yesterday as well as communication with his wife  States right leg feels better   Physical Exam:  Sensation improved to right leg, appears better perfused Abd soft Neuro intact       Assessment/Plan:    Continue heparin drip  Plan for angiogram on Tuesday  He is having some shortness of breath, will have cardiology evaluate tomorrow  Annamarie Major 11/17/2019 8:33 AM --  Vitals:   11/17/19 0521 11/17/19 0718  BP: 104/68   Pulse: 70   Resp: 20   Temp: 98.1 F (36.7 C)   SpO2: 100% 97%    Intake/Output Summary (Last 24 hours) at 11/17/2019 0833 Last data filed at 11/17/2019 0304 Gross per 24 hour  Intake 1184.11 ml  Output 1200 ml  Net -15.89 ml     Laboratory CBC    Component Value Date/Time   WBC 8.9 11/17/2019 0448   HGB 9.4 (L) 11/17/2019 0448   HGB 12.7 (L) 07/29/2019 1004   HCT 28.0 (L) 11/17/2019 0448   HCT 38.3 07/29/2019 1004   PLT 280 11/17/2019 0448   PLT 191 07/29/2019 1004    BMET    Component Value Date/Time   NA 134 (L) 11/17/2019 0448   NA 139 10/29/2019 1139   K 4.0 11/17/2019 0448   CL 100 11/17/2019 0448   CO2 20 (L) 11/17/2019 0448   GLUCOSE 128 (H) 11/17/2019 0448   BUN 50 (H) 11/17/2019 0448   BUN 37 (H) 10/29/2019 1139   CREATININE 2.43 (H) 11/17/2019 0448   CREATININE 1.34 (H) 10/31/2016 0844   CALCIUM 8.8 (L) 11/17/2019 0448   GFRNONAA 25 (L) 11/17/2019 0448   GFRNONAA 46 (L) 10/24/2014 0943   GFRAA 29 (L) 11/17/2019 0448   GFRAA 53 (L) 10/24/2014 0943    COAG Lab Results  Component Value Date   INR 2.5 (H) 09/30/2019   INR 1.5 (H) 06/22/2019   INR 1.1 (A) 05/04/2018   No results found for: PTT  Antibiotics Anti-infectives (From admission, onward)   None       V. Leia Alf, M.D., [facility] [place] Office: [phone] Pager:  [phone]

## 2019-11-17 NOTE — Progress Notes (Signed)
ANTICOAGULATION CONSULT NOTE  Pharmacy Consult for heparin Indication: AFib/PVD  No Known Allergies  Patient Measurements: Height: 6\' 6"  (198.1 cm) Weight: 194 lb 12.8 oz (88.4 kg) IBW/kg (Calculated) : 91.4 Heparin Dosing Weight: 88kg  Vital Signs: Temp: 98.1 F (36.7 C) (03/14 0521) Temp Source: Oral (03/14 0521) BP: 104/68 (03/14 0521) Pulse Rate: 70 (03/14 0521)  Labs: Recent Labs    11/15/19 1110 11/15/19 1110 11/15/19 1318 11/15/19 1430 11/15/19 2333 11/15/19 2333 11/16/19 0256 11/16/19 1816 11/17/19 0448  HGB 9.2*   < >  --   --  9.1*   < > 9.0*  --  9.4*  HCT 29.3*   < >  --   --  27.5*  --  27.0*  --  28.0*  PLT 270   < >  --   --  261  --  254  --  280  HEPARINUNFRC  --   --   --   --   --   --  0.51 0.70 0.75*  CREATININE 2.56*  --   --   --   --   --  2.74*  --  2.43*  TROPONINIHS  --   --  7 8  --   --   --   --   --    < > = values in this interval not displayed.    Estimated Creatinine Clearance: 32.3 mL/min (A) (by C-G formula based on SCr of 2.43 mg/dL (H)).  Assessment: 79 y.o. male with h/o Afib. Eliquis on hold since discharge of previous admission on 3/5, for heparin.  Heparin level increased and remains subtherapeutic despite decreasing drip rate to 1200 units/hr. CBC stable. No overt bleeding noted.  Goal of Therapy:  Heparin level 0.3-0.7 units/ml Monitor platelets by anticoagulation protocol: Yes   Plan:  Decrease heparin drip 1100 uts/hr  Check 8-hr HL Daily HL, CBC  Monitor s/s bleeding   Richardine Service, PharmD PGY1 Pharmacy Resident Phone: [phone] 11/17/2019  7:28 AM  Please check [email or web address] for unit-specific pharmacy phone numbers.

## 2019-11-18 DIAGNOSIS — N179 Acute kidney failure, unspecified: Secondary | ICD-10-CM

## 2019-11-18 DIAGNOSIS — I4819 Other persistent atrial fibrillation: Secondary | ICD-10-CM

## 2019-11-18 DIAGNOSIS — E1121 Type 2 diabetes mellitus with diabetic nephropathy: Secondary | ICD-10-CM

## 2019-11-18 DIAGNOSIS — I5021 Acute systolic (congestive) heart failure: Secondary | ICD-10-CM

## 2019-11-18 DIAGNOSIS — N1832 Chronic kidney disease, stage 3b: Secondary | ICD-10-CM

## 2019-11-18 DIAGNOSIS — Z9861 Coronary angioplasty status: Secondary | ICD-10-CM

## 2019-11-18 DIAGNOSIS — I2510 Atherosclerotic heart disease of native coronary artery without angina pectoris: Secondary | ICD-10-CM

## 2019-11-18 DIAGNOSIS — N184 Chronic kidney disease, stage 4 (severe): Secondary | ICD-10-CM

## 2019-11-18 LAB — CBC
HCT: 29.2 % — ABNORMAL LOW (ref 39.0–52.0)
Hemoglobin: 9.6 g/dL — ABNORMAL LOW (ref 13.0–17.0)
MCH: 25.5 pg — ABNORMAL LOW (ref 26.0–34.0)
MCHC: 32.9 g/dL (ref 30.0–36.0)
MCV: 77.5 fL — ABNORMAL LOW (ref 80.0–100.0)
Platelets: 263 10*3/uL (ref 150–400)
RBC: 3.77 MIL/uL — ABNORMAL LOW (ref 4.22–5.81)
RDW: 21.1 % — ABNORMAL HIGH (ref 11.5–15.5)
WBC: 9.2 10*3/uL (ref 4.0–10.5)
nRBC: 0.3 % — ABNORMAL HIGH (ref 0.0–0.2)

## 2019-11-18 LAB — RENAL FUNCTION PANEL
Albumin: 3.2 g/dL — ABNORMAL LOW (ref 3.5–5.0)
Anion gap: 15 (ref 5–15)
BUN: 44 mg/dL — ABNORMAL HIGH (ref 8–23)
CO2: 23 mmol/L (ref 22–32)
Calcium: 9.3 mg/dL (ref 8.9–10.3)
Chloride: 100 mmol/L (ref 98–111)
Creatinine, Ser: 2.34 mg/dL — ABNORMAL HIGH (ref 0.61–1.24)
GFR calc Af Amer: 30 mL/min — ABNORMAL LOW (ref >60)
GFR calc non Af Amer: 26 mL/min — ABNORMAL LOW (ref >60)
Glucose, Bld: 100 mg/dL — ABNORMAL HIGH (ref 70–99)
Phosphorus: 3.7 mg/dL (ref 2.5–4.6)
Potassium: 4.2 mmol/L (ref 3.5–5.1)
Sodium: 138 mmol/L (ref 135–145)

## 2019-11-18 LAB — MAGNESIUM: Magnesium: 2.1 mg/dL (ref 1.7–2.4)

## 2019-11-18 LAB — HEPARIN LEVEL (UNFRACTIONATED): Heparin Unfractionated: 0.3 IU/mL (ref 0.30–0.70)

## 2019-11-18 MED ORDER — ADULT MULTIVITAMIN W/MINERALS CH
1.0000 | ORAL_TABLET | Freq: Every day | ORAL | Status: DC
  Administered 2019-11-18 – 2019-11-21 (×4): 1 via ORAL
  Filled 2019-11-18 (×4): qty 1

## 2019-11-18 MED ORDER — FUROSEMIDE 10 MG/ML IJ SOLN
100.0000 mg | Freq: Once | INTRAVENOUS | Status: AC
  Administered 2019-11-18: 100 mg via INTRAVENOUS
  Filled 2019-11-18: qty 10

## 2019-11-18 MED ORDER — ENSURE ENLIVE PO LIQD
237.0000 mL | Freq: Three times a day (TID) | ORAL | Status: DC
  Administered 2019-11-18 – 2019-11-21 (×9): 237 mL via ORAL

## 2019-11-18 NOTE — Progress Notes (Signed)
PROGRESS NOTE  Logan White WUJ:[id number] DOB: 05/22/1941   PCP: Patient, No Pcp Per  Patient is from: Home.  Lives with his wife.  Uses walker and cane at home.  DOA: 11/15/2019 LOS: 3  Brief Narrative / Interim history: 79 year old male with past medical history significant for A. fib on Eliquis, COPD/chronic RF on 2 L by [place], PVD, pulmonary hypertension, AAA followed by Dr. Gwenlyn Found, CVA, DM-2, CAD s/p stent, CKD-3b/4, biventricular heart failure with EF of 25 to 30%.  Patient presented with BLE pain and numbness, Rt > Lt, and shortness of breath.  Patient is currently being managed for limb threatening ischemia/PAD and acute on chronic systolic CHF and AKI on NFA-2Z/3.  CXR with pulmonary edema on presentation.  Evaluated by vascular surgery who is planning angiogram once renal function is optimized.  Vascular surgery plans to proceed with angiography tomorrow, 11/19/2019.  Will consult nephrology team (discussed with Dr. Hollie Salk) as patient is at big risk risk of worsening renal function.  Hopefully, nephrology will address possible hydration prior to procedure, if patient will tolerate versus just holding diuretics.  Either way, the risk for contrast-induced worsening renal function persist.    11/18/2019: Patient seen.  No new changes.  Input from cardiology and vascular surgery team appreciated.  For possible angiography/angioplasty tomorrow by vascular surgery team.  Currently on heparin drip.  Serum creatinine is 2.34.  Patient is at risk for contrast-induced nephropathy.  I have consulted nephrology team.  We will hold further diuretics.  Nephrology to advise need for hydration prior to procedure i.e. if patient cardiac status tolerates.  This is a difficult situation.      Subjective: No new complaints Right lower extremity pain and numbness persist.  Objective: Vitals:   11/18/19 0500 11/18/19 0717 11/18/19 0832 11/18/19 1117  BP:   118/72 (!) 97/47  Pulse:   86 74  Resp:   18 (!)  21  Temp:   98 F (36.7 C) 98.6 F (37 C)  TempSrc:   Oral Oral  SpO2:  95% 100% 97%  Weight: 88.5 kg     Height:        Intake/Output Summary (Last 24 hours) at 11/18/2019 1548 Last data filed at 11/18/2019 1526 Gross per 24 hour  Intake 983.53 ml  Output 1750 ml  Net -766.47 ml   Filed Weights   11/16/19 0333 11/17/19 0521 11/18/19 0500  Weight: 89.3 kg 88.4 kg 88.5 kg    Examination:  GENERAL: No acute distress.    HEENT: Mild pallor.  No jaundice. NECK: Supple.  No apparent JVD.  RESP: Decreased air entry.  Query wheezing from upper airways. CVS: Y8-M5, ejection systolic murmur, irregular. ABD/GI/GU: Bowel sounds present. Soft. Non tender.  EXT: Mild leg edema. NEURO: Awake, alert and oriented fairly.  Patient moves all extremities..    Procedures:  None  Assessment & Plan: PVD/limb threatening ischemia:  -Followed by Dr. Gwenlyn Found -Per DDS, plan for angiogram and revascularization, possibly, tomorrow. -Hold Eliquis.  Continue IV heparin, statin and aspirin.  -Check lipid panel -Nephrology team consulted   Acute on chronic systolic CHF/RV failure:  -Echo on 09/29/2019 with EF of 25 to 78%, unknown diastolic function, global hypokinesis, severely reduced RVSF, severe LAE/RAE, interatrial septum aneurysm, and moderate pulm HTN.  RHC in 1/21 with pulmonary hypertension and RV dysfunction.  CXR concerning for pulmonary edema.  BNP 2400 (1600 two weeks ago).  No crackles on lung exam.  Only 300 cc urine output charted.  Creatinine trended up.  Soft blood pressures. -Hold diuretics pre angiography/angioplasty -Cardiology input is appreciated.      AKI on CKD-3B/4 -Serum creatinine today is 2.34  -Nephrology consulted  -Patient is at risk for contrast-induced nephropathy.    Persistent Afib:  -Was on high-dose metoprolol and Eliquis -Currently on (preprocedure) -Continue to optimize heart rate  DM-2 with CKD-3b:  -Monitor and optimize.    Hypertension:  -Aim  for MAP greater than 65 mmHg.   -Avoid excessive drop in blood pressure.   -Goal blood pressure is less than 130/80 mmHg.   Chronic respiratory failure:  -On 2 L at baseline. -Chronic COPD -Wean oxygen as stable -Continue Dulera and as needed albuterol  HLD -Lipid panel -Continue Lipitor  CAD s/p DES to LAD in 2004, PCI in 2014.  No chest pain. -Continue BB, statin and aspirin. -Stable.  No chest pain.  AAA:  -3.0, stable, and unchanged on [place] on 03/15/19 -Continue statin and aspirin  Debility/physical deconditioning: Uses walker at baseline. -PT/OT  Goal of care: -Patient remains full code.  -Palliative care input is appreciated.   Nutrition: -Consult dietitian    DVT prophylaxis: On heparin drip Code Status: Full code Family Communication:    Discharge barrier: Evaluation and treatment of limb threatening ischemia, CHF exacerbation and AKI Patient is from: Home Final disposition: To be determined  Consultants: VVS, cardiology, palliative care   Microbiology summarized: COVID-19 screen negative.  Sch Meds:  Scheduled Meds:  atorvastatin  80 mg Oral Daily   feeding supplement (ENSURE ENLIVE)  237 mL Oral TID BM   metoprolol succinate  50 mg Oral Daily   mometasone-formoterol  2 puff Inhalation BID   multivitamin with minerals  1 tablet Oral Daily   pantoprazole  40 mg Oral BID   potassium chloride SA  20 mEq Oral BID   sodium chloride flush  3 mL Intravenous Q12H   Continuous Infusions:  sodium chloride     heparin 950 Units/hr (11/18/19 1304)   PRN Meds:.sodium chloride, acetaminophen, albuterol, fentaNYL (SUBLIMAZE) injection, ondansetron (ZOFRAN) IV, sodium chloride flush  Antimicrobials: Anti-infectives (From admission, onward)   None       I have personally reviewed the following labs and images: CBC: Recent Labs  Lab 11/15/19 1110 11/15/19 2333 11/16/19 0256 11/17/19 0448 11/18/19 0522  WBC 8.8 8.4 8.2 8.9 9.2   NEUTROABS  --  6.0  --   --   --   HGB 9.2* 9.1* 9.0* 9.4* 9.6*  HCT 29.3* 27.5* 27.0* 28.0* 29.2*  MCV 81.2 76.6* 76.9* 76.3* 77.5*  PLT 270 261 254 280 263   BMP &GFR Recent Labs  Lab 11/15/19 1110 11/15/19 1318 11/16/19 0256 11/17/19 0448 11/18/19 0522  NA 136  --  134* 134* 138  K 3.9  --  4.3 4.0 4.2  CL 100  --  101 100 100  CO2 22  --  18* 20* 23  GLUCOSE 91  --  139* 128* 100*  BUN 40*  --  49* 50* 44*  CREATININE 2.56*  --  2.74* 2.43* 2.34*  CALCIUM 8.6*  --  8.6* 8.8* 9.3  MG  --  2.1  --  2.1 2.1  PHOS  --   --   --  4.3 3.7   Estimated Creatinine Clearance: 33.6 mL/min (A) (by C-G formula based on SCr of 2.34 mg/dL (H)). Liver & Pancreas: Recent Labs  Lab 11/17/19 0448 11/18/19 0522  ALBUMIN 3.0* 3.2*   No results for  input(s): LIPASE, AMYLASE in the last 168 hours. No results for input(s): AMMONIA in the last 168 hours. Diabetic: Recent Labs    11/17/19 0448  HGBA1C 5.7*   No results for input(s): GLUCAP in the last 168 hours. Cardiac Enzymes: No results for input(s): CKTOTAL, CKMB, CKMBINDEX, TROPONINI in the last 168 hours. Recent Labs    08/15/19 0949  PROBNP 16,511*   Coagulation Profile: No results for input(s): INR, PROTIME in the last 168 hours. Thyroid Function Tests: No results for input(s): TSH, T4TOTAL, FREET4, T3FREE, THYROIDAB in the last 72 hours. Lipid Profile: Recent Labs    11/17/19 0448  CHOL 66  HDL 27*  LDLCALC 34  TRIG 27  CHOLHDL 2.4   Anemia Panel: Recent Labs    11/16/19 1501  VITAMINB12 683  FOLATE 14.8  FERRITIN 43  TIBC 447  IRON 23*  RETICCTPCT 2.8   Urine analysis:    Component Value Date/Time   COLORURINE YELLOW 11/04/2019 1500   APPEARANCEUR CLEAR 11/04/2019 1500   LABSPEC 1.011 11/04/2019 1500   PHURINE 6.0 11/04/2019 1500   GLUCOSEU NEGATIVE 11/04/2019 1500   HGBUR NEGATIVE 11/04/2019 1500   BILIRUBINUR NEGATIVE 11/04/2019 1500   KETONESUR NEGATIVE 11/04/2019 1500   PROTEINUR NEGATIVE  11/04/2019 1500   UROBILINOGEN 0.2 03/23/2013 1740   NITRITE NEGATIVE 11/04/2019 1500   LEUKOCYTESUR NEGATIVE 11/04/2019 1500   Sepsis Labs: Invalid input(s): PROCALCITONIN, [place]  Microbiology: Recent Results (from the past 240 hour(s))  SARS CORONAVIRUS 2 (TAT 6-24 HRS) Nasopharyngeal Nasopharyngeal Swab     Status: None   Collection Time: 11/15/19  2:18 PM   Specimen: Nasopharyngeal Swab  Result Value Ref Range Status   SARS Coronavirus 2 NEGATIVE NEGATIVE Final    Comment: (NOTE) SARS-CoV-2 target nucleic acids are NOT DETECTED. The SARS-CoV-2 RNA is generally detectable in upper and lower respiratory specimens during the acute phase of infection. Negative results do not preclude SARS-CoV-2 infection, do not rule out co-infections with other pathogens, and should not be used as the sole basis for treatment or other patient management decisions. Negative results must be combined with clinical observations, patient history, and epidemiological information. The expected result is Negative. Fact Sheet for Patients: SugarRoll.be Fact Sheet for Healthcare Providers: https://www.woods-mathews.com/ This test is not yet approved or cleared by the [place] FDA and  has been authorized for detection and/or diagnosis of SARS-CoV-2 by FDA under an Emergency Use Authorization (EUA). This EUA will remain  in effect (meaning this test can be used) for the duration of the COVID-19 declaration under Section 56 4(b)(1) of the Act, 21 U.S.C. section 360bbb-3(b)(1), unless the authorization is terminated or revoked sooner. Performed at [facility], [place] [address], [place], [place] [address]     Radiology Studies: No results found.  Time spent: 35 minutes Dana Allan, MD [facility]  If 7PM-7AM, please contact night-coverage [email or web address] Password [facility] 11/18/2019, 3:48 PM

## 2019-11-18 NOTE — Consult Note (Signed)
[facility]  HISTORY AND PHYSICAL  Logan White is an 79 y.o. male.    Chief Complaint: numb and cold legs  HPI: Pt is a 77 M with PAD, CKD 3b, CAD, DM, h/o Vfib arrest, AAA, and Afib who is now seen in consultation at the request of Dr. Marthenia Rolling for eval and recs re: contrast admin in CKD.  Pt presented to the hospital 3/12 for evaluation,  Was found to be volume overloaded and thought to have ischemic legs.  Aortogram is scheduled for tomorrow.    Has gotten IV Lasix and Cr has actually improved from 2.74-->2.34 today.  The second dose of IV Lasix has been held today in preparation for procedure tomorrow.  He is not thought to be able to tolerate fluids given vol overload.    Feeling overwhelmed with all these new diagnoses today.  Had a prior visit from the chaplain which helped.  PMH: Past Medical History:  Diagnosis Date  . AAA (abdominal aortic aneurysm) ([place])    a. 08/2015 Abd U/S: 2.7 x 2.9 x 3.2 cm infrarenal AAA.  . Bradycardia    a. Requiring discontinuation of use of BB/CCB  . Cardiac arrest - ventricular fibrillation    a. 03/2012 in setting of hypokalemia (prolonged hosp with VDRF, tracheobronchitis, ARF, shock liver, PAF, AMS felt secondary to post-anoxic encephalopathy/shock).  . Carotid artery stenosis    a. 01/2011 - 40-59% bilateral stenosis;  b. 06/2015 Carotid U/S: 40-59% bilat ICA stenosis->f/u 6 mos.  . CKD (chronic kidney disease), stage III   . Coronary artery disease    a. s/p aborted ant STEMI tx with Cypher DES to LAD 10/04 (residual at cath: D1 50%, CFX 40% and multiple dist 70%, EF 55%);   b. myoview 3/10: Ef 47%, infero-apical isch, LOW RISK - med Tx recommended;  c. 12/2012 VF Arrest/Cath: LAD 40isr, 80 apical, LCX 100 (failed PTCA), RCA nondom.  . Diabetes mellitus   . Diverticulosis 2001  . Elevated LFTs    Shock liver 03/2012  . H/O: CVA (cardiovascular accident)   . Hematemesis    a. 12/2010 felt 2/2 Mallory Weiss tear - pt could not  afford colonscopy/EGD at that time so Coumadin was deferred. Coumadin initiated 03/2012 without any evidence for bleeding.  Marland Kitchen History of Ischemic Cardiomyopathy    a. EF 50-55% by echo 03/09/12 (was 30% by echo 02/24/12); b. 03/2013 Echo: EF 55-60%, mild MR, sev dil LA, mod dil RA, PASP 26mmHg.  Marland Kitchen Hypertensive heart disease    a. echo 4/12: EF 50%, asymmetric septal hypertrophy, no SAM or LVOT gradient, LAE, PASP 35  . Inguinal hernia   . 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 Mitral regurgitation    a. Mild by echo 03/2012 and 03/2013.  . Mixed Hyperlipidemia   . Peripheral vascular disease ([place])    a. h/o LE angioplasty;  b. 08/2015 Duplex: >50% RCIA, 100% REIA, >50% LEIA, elev vel in SMA, patent IVC.  Marland Kitchen Persistent atrial fibrillation ([place])    a. Dx 12/2010-->coumadin (CHA2DS2VASc = 7).  . QT prolongation    PSH: Past Surgical History:  Procedure Laterality Date  . BIOPSY  11/07/2019   Procedure: BIOPSY;  Surgeon: [name], MD;  Location: [place];  Service: Endoscopy;;  . COLONOSCOPY WITH PROPOFOL N/A 11/07/2019   Procedure: COLONOSCOPY WITH PROPOFOL;  Surgeon: [name], MD;  Location: [facility] ENDOSCOPY;  Service: Endoscopy;  Laterality: N/A;  . ESOPHAGOGASTRODUODENOSCOPY (EGD) WITH PROPOFOL N/A  11/07/2019   Procedure: ESOPHAGOGASTRODUODENOSCOPY (EGD) WITH PROPOFOL;  Surgeon: [name], MD;  Location: [facility] ENDOSCOPY;  Service: Endoscopy;  Laterality: N/A;  . HEMOSTASIS CLIP PLACEMENT  11/07/2019   Procedure: HEMOSTASIS CLIP PLACEMENT;  Surgeon: [name], MD;  Location: [facility] ENDOSCOPY;  Service: Endoscopy;;  . HERNIA REPAIR    . LEFT HEART CATH N/A 12/30/2012   Procedure: LEFT HEART CATH;  Surgeon: [name], MD;  Location: [facility] CATH LAB;  Service: Cardiovascular;  Laterality: N/A;  . ORIF TIBIA & FIBULA FRACTURES  01/11/07   OPEN TX OF UNICONDYLAR PLATEAU FRACTURE, IRRIGATION/DEBRIDEMENT OF OPEN FRACTURE INCLUDING BONE, REMOVAL OF  EXTERNAL FIXATOR UNDER ANESTHESIA PER DR. MICHAEL HANDY  . PERIPHERAL VASCULAR CATHETERIZATION N/A 12/21/2015   Procedure: Lower Extremity Angiography;  Surgeon: [name], MD;  Location: [facility] CV LAB;  Service: Cardiovascular;  Laterality: N/A;  . POLYPECTOMY  11/07/2019   Procedure: POLYPECTOMY;  Surgeon: [name], MD;  Location: [place];  Service: Endoscopy;;  . POPLITEAL ARTERY ANGIOPLASTY  01/07/07   s/p LEFT POPLITEAL ARTERY EXPLORATION AND VEIN PATCH ANGIOPLASTY, PER DR. EARLY, SECONDARY TO ISCHEMIC LEFT FOOT RELATED TO LEFT POPLITEAL ARTERY INJURY  . RIGHT HEART CATH N/A 10/02/2019   Procedure: RIGHT HEART CATH;  Surgeon: [name], MD;  Location: [place] CV LAB;  Service: Cardiovascular;  Laterality: N/A;  . SKIN GRAFT    . TEE WITHOUT CARDIOVERSION N/A 08/08/2019   Procedure: TRANSESOPHAGEAL ECHOCARDIOGRAM (TEE);  Surgeon: Lelon Perla, MD;  Location: [facility] ENDOSCOPY;  Service: Cardiovascular;  Laterality: N/A;    Past Medical History:  Diagnosis Date  . AAA (abdominal aortic aneurysm) ([place])    a. 08/2015 Abd U/S: 2.7 x 2.9 x 3.2 cm infrarenal AAA.  . Bradycardia    a. Requiring discontinuation of use of BB/CCB  . Cardiac arrest - ventricular fibrillation    a. 03/2012 in setting of hypokalemia (prolonged hosp with VDRF, tracheobronchitis, ARF, shock liver, PAF, AMS felt secondary to post-anoxic encephalopathy/shock).  . Carotid artery stenosis    a. 01/2011 - 40-59% bilateral stenosis;  b. 06/2015 Carotid U/S: 40-59% bilat ICA stenosis->f/u 6 mos.  . CKD (chronic kidney disease), stage III   . Coronary artery disease    a. s/p aborted ant STEMI tx with Cypher DES to LAD 10/04 (residual at cath: D1 50%, CFX 40% and multiple dist 70%, EF 55%);   b. myoview 3/10: Ef 47%, infero-apical isch, LOW RISK - med Tx recommended;  c. 12/2012 VF Arrest/Cath: LAD 40isr, 80 apical, LCX 100 (failed PTCA), RCA nondom.  . Diabetes mellitus   . Diverticulosis 2001  .  Elevated LFTs    Shock liver 03/2012  . H/O: CVA (cardiovascular accident)   . Hematemesis    a. 12/2010 felt 2/2 Mallory Weiss tear - pt could not afford colonscopy/EGD at that time so Coumadin was deferred. Coumadin initiated 03/2012 without any evidence for bleeding.  Marland Kitchen History of Ischemic Cardiomyopathy    a. EF 50-55% by echo 03/09/12 (was 30% by echo 02/24/12); b. 03/2013 Echo: EF 55-60%, mild MR, sev dil LA, mod dil RA, PASP 107mmHg.  Marland Kitchen Hypertensive heart disease    a. echo 4/12: EF 50%, asymmetric septal hypertrophy, no SAM or LVOT gradient, LAE, PASP 35  . Inguinal hernia   . 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 Mitral regurgitation    a. Mild by echo 03/2012 and 03/2013.  . Mixed Hyperlipidemia   . Peripheral vascular  disease ([place])    a. h/o LE angioplasty;  b. 08/2015 Duplex: >50% RCIA, 100% REIA, >50% LEIA, elev vel in SMA, patent IVC.  Marland Kitchen Persistent atrial fibrillation ([place])    a. Dx 12/2010-->coumadin (CHA2DS2VASc = 7).  . QT prolongation     Medications:   Scheduled: . atorvastatin  80 mg Oral Daily  . feeding supplement (ENSURE ENLIVE)  237 mL Oral TID BM  . metoprolol succinate  50 mg Oral Daily  . mometasone-formoterol  2 puff Inhalation BID  . multivitamin with minerals  1 tablet Oral Daily  . pantoprazole  40 mg Oral BID  . potassium chloride SA  20 mEq Oral BID  . sodium chloride flush  3 mL Intravenous Q12H    Medications Prior to Admission  Medication Sig Dispense Refill  . acetaminophen (TYLENOL) 325 MG tablet Take 650 mg by mouth every 6 (six) hours as needed for headache.    . albuterol (VENTOLIN HFA) 108 (90 Base) MCG/ACT inhaler Inhale 2 puffs into the lungs every 6 (six) hours as needed for wheezing or shortness of breath. 90 g 3  . atorvastatin (LIPITOR) 80 MG tablet Take 80 mg by mouth daily.    . Ensure (ENSURE) Take 237 mLs by mouth 2 (two) times daily as needed (nutritional supplement).    . ferrous sulfate 325 (65 FE)  MG tablet Take 1 tablet (325 mg total) by mouth 2 (two) times daily with a meal. 60 tablet 3  . Fluticasone-Salmeterol (ADVAIR DISKUS) 250-50 MCG/DOSE AEPB Inhale 1 puff into the lungs 2 (two) times daily for 14 days. (Patient taking differently: Inhale 1 puff into the lungs 2 (two) times daily as needed (shortness of breath). ) 28 each 0  . hydrALAZINE (APRESOLINE) 10 MG tablet Take 1 tablet (10 mg total) by mouth 2 (two) times daily. 180 tablet 0  . isosorbide mononitrate (IMDUR) 30 MG 24 hr tablet Take 1 tablet (30 mg total) by mouth daily. 30 tablet 1  . metoprolol succinate (TOPROL-XL) 50 MG 24 hr tablet Take 3 tablets (150 mg total) by mouth daily. Take with or immediately following a meal. 180 tablet 0  . nitroGLYCERIN (NITROSTAT) 0.4 MG SL tablet Place 1 tablet (0.4 mg total) under the tongue every 5 (five) minutes as needed for chest pain (Up to 3 doses). 25 tablet 4  . oxymetazoline (AFRIN) 0.05 % nasal spray Place 1 spray into both nostrils 2 (two) times daily as needed for congestion.    . pantoprazole (PROTONIX) 40 MG tablet Take 1 tablet (40 mg total) by mouth 2 (two) times daily. 60 tablet 0  . potassium chloride SA (KLOR-CON) 20 MEQ tablet Take 1 tablet (20 mEq total) by mouth 2 (two) times daily. 120 tablet 0  . torsemide (DEMADEX) 20 MG tablet Take 3 tablets (60 mg total) by mouth 2 (two) times daily. 360 tablet 0  . apixaban (ELIQUIS) 5 MG TABS tablet Take 1 tablet (5 mg total) by mouth 2 (two) times daily. 60 tablet 0  . aspirin EC 81 MG EC tablet Take 1 tablet (81 mg total) by mouth daily. (Patient not taking: Reported on 11/15/2019) 60 tablet 0    ALLERGIES:  No Known Allergies  FAM HX: Family History  Problem Relation Age of Onset  . Heart disease Father        ALSO UNCLE DIED HAD CAD  . Heart failure Father   . Brain cancer Mother   . Leukemia Mother   . Sickle cell  anemia Mother   . Sickle cell anemia Other     Social History:   reports that he quit smoking about  7 years ago. His smoking use included cigarettes. He has a 15.00 pack-year smoking history. He has never used smokeless tobacco. He reports that he does not drink alcohol or use drugs.  ROS: ROS: all other systems reviewed and are negative except as per HPI  Blood pressure (!) 97/47, pulse 74, temperature 98.6 F (37 C), temperature source Oral, resp. rate (!) 21, height 6\' 6"  (1.981 m), weight 88.5 kg, SpO2 97 %. PHYSICAL EXAM: Physical Exam  GEN sitting up in bed, NAD HEENT EOMI PERRL NECK some increased JVD PULM bilateral scattered wheezing CV irreg ABD soft nontender EXT cool legs with poor pulses and edema NEURO AAO x 3 SKIN prior area of skin graft   Results for orders placed or performed during the hospital encounter of 11/15/19 (from the past 48 hour(s))  Heparin level (unfractionated)     Status: None   Collection Time: 11/16/19  6:16 PM  Result Value Ref Range   Heparin Unfractionated 0.70 0.30 - 0.70 IU/mL    Comment: (NOTE) If heparin results are below expected values, and patient dosage has  been confirmed, suggest follow up testing of antithrombin III levels. Performed at [facility], [place] [address] [place], [place] [address]   Heparin level (unfractionated)     Status: Abnormal   Collection Time: 11/17/19  4:48 AM  Result Value Ref Range   Heparin Unfractionated 0.75 (H) 0.30 - 0.70 IU/mL    Comment: (NOTE) If heparin results are below expected values, and patient dosage has  been confirmed, suggest follow up testing of antithrombin III levels. Performed at [facility], [place] [address], [place], [place] [address]   CBC     Status: Abnormal   Collection Time: 11/17/19  4:48 AM  Result Value Ref Range   WBC 8.9 4.0 - 10.5 K/uL   RBC 3.67 (L) 4.22 - 5.81 MIL/uL   Hemoglobin 9.4 (L) 13.0 - 17.0 g/dL   HCT 28.0 (L) 39.0 - 52.0 %   MCV 76.3 (L) 80.0 - 100.0 fL   MCH 25.6 (L) 26.0 - 34.0 pg   MCHC 33.6 30.0 - 36.0 g/dL   RDW 20.8 (H) 11.5 - 15.5  %   Platelets 280 150 - 400 K/uL   nRBC 0.3 (H) 0.0 - 0.2 %    Comment: Performed at [facility] [address] [place], [place] [address]  Hemoglobin A1c     Status: Abnormal   Collection Time: 11/17/19  4:48 AM  Result Value Ref Range   Hgb A1c MFr Bld 5.7 (H) 4.8 - 5.6 %    Comment: (NOTE) Pre diabetes:          5.7%-6.4% Diabetes:              >6.4% Glycemic control for   <7.0% adults with diabetes    Mean Plasma Glucose 116.89 mg/dL    Comment: Performed at [facility] [address], [place], [place] [address]  Lipid panel     Status: Abnormal   Collection Time: 11/17/19  4:48 AM  Result Value Ref Range   Cholesterol 66 0 - 200 mg/dL   Triglycerides 27 <150 mg/dL   HDL 27 (L) >40 mg/dL   Total CHOL/HDL Ratio 2.4 RATIO   VLDL 5 0 - 40 mg/dL   LDL Cholesterol 34  0 - 99 mg/dL    Comment:        Total Cholesterol/HDL:CHD Risk Coronary Heart Disease Risk Table                     Men   Women  1/2 Average Risk   3.4   3.3  Average Risk       5.0   4.4  2 X Average Risk   9.6   7.1  3 X Average Risk  23.4   11.0        Use the calculated Patient Ratio above and the CHD Risk Table to determine the patient's CHD Risk.        ATP III CLASSIFICATION (LDL):  <100     mg/dL   Optimal  100-129  mg/dL   Near or Above                    Optimal  130-159  mg/dL   Borderline  160-189  mg/dL   High  >190     mg/dL   Very High Performed at [facility] [address], [place], [place] [address]   Renal function panel     Status: Abnormal   Collection Time: 11/17/19  4:48 AM  Result Value Ref Range   Sodium 134 (L) 135 - 145 mmol/L   Potassium 4.0 3.5 - 5.1 mmol/L   Chloride 100 98 - 111 mmol/L   CO2 20 (L) 22 - 32 mmol/L   Glucose, Bld 128 (H) 70 - 99 mg/dL    Comment: Glucose reference range applies only to samples taken after fasting for at least 8 hours.   BUN 50 (H) 8 - 23 mg/dL   Creatinine, Ser 2.43 (H) 0.61 - 1.24 mg/dL   Calcium 8.8 (L) 8.9  - 10.3 mg/dL   Phosphorus 4.3 2.5 - 4.6 mg/dL   Albumin 3.0 (L) 3.5 - 5.0 g/dL   GFR calc non Af Amer 25 (L) >60 mL/min   GFR calc Af Amer 29 (L) >60 mL/min   Anion gap 14 5 - 15    Comment: Performed at [facility] [address], [place], [place] [address]  Magnesium     Status: None   Collection Time: 11/17/19  4:48 AM  Result Value Ref Range   Magnesium 2.1 1.7 - 2.4 mg/dL    Comment: Performed at [facility] [address] [place], [place] [address]  Heparin level (unfractionated)     Status: Abnormal   Collection Time: 11/17/19  3:55 PM  Result Value Ref Range   Heparin Unfractionated 0.78 (H) 0.30 - 0.70 IU/mL    Comment: (NOTE) If heparin results are below expected values, and patient dosage has  been confirmed, suggest follow up testing of antithrombin III levels. Performed at [facility], [place] [address] [place], [place] [address]   Heparin level (unfractionated)     Status: None   Collection Time: 11/18/19  5:22 AM  Result Value Ref Range   Heparin Unfractionated 0.30 0.30 - 0.70 IU/mL    Comment: (NOTE) If heparin results are below expected values, and patient dosage has  been confirmed, suggest follow up testing of antithrombin III levels. Performed at [facility], [place] [address] [place], [place] [address]   CBC     Status: Abnormal   Collection Time: 11/18/19  5:22 AM  Result Value Ref Range   WBC 9.2  4.0 - 10.5 K/uL   RBC 3.77 (L) 4.22 - 5.81 MIL/uL   Hemoglobin 9.6 (L) 13.0 - 17.0 g/dL   HCT 29.2 (L) 39.0 - 52.0 %   MCV 77.5 (L) 80.0 - 100.0 fL   MCH 25.5 (L) 26.0 - 34.0 pg   MCHC 32.9 30.0 - 36.0 g/dL   RDW 21.1 (H) 11.5 - 15.5 %   Platelets 263 150 - 400 K/uL   nRBC 0.3 (H) 0.0 - 0.2 %    Comment: Performed at [facility] [address] [place], [place] [address]  Renal function panel     Status: Abnormal   Collection Time: 11/18/19  5:22 AM  Result Value Ref Range   Sodium 138 135 - 145 mmol/L   Potassium  4.2 3.5 - 5.1 mmol/L   Chloride 100 98 - 111 mmol/L   CO2 23 22 - 32 mmol/L   Glucose, Bld 100 (H) 70 - 99 mg/dL    Comment: Glucose reference range applies only to samples taken after fasting for at least 8 hours.   BUN 44 (H) 8 - 23 mg/dL   Creatinine, Ser 2.34 (H) 0.61 - 1.24 mg/dL   Calcium 9.3 8.9 - 10.3 mg/dL   Phosphorus 3.7 2.5 - 4.6 mg/dL   Albumin 3.2 (L) 3.5 - 5.0 g/dL   GFR calc non Af Amer 26 (L) >60 mL/min   GFR calc Af Amer 30 (L) >60 mL/min   Anion gap 15 5 - 15    Comment: Performed at [facility] [address], [place], [place] [address]  Magnesium     Status: None   Collection Time: 11/18/19  5:22 AM  Result Value Ref Range   Magnesium 2.1 1.7 - 2.4 mg/dL    Comment: Performed at [facility] [address] [place], [place] [address]    No results found.  Assessment/Plan  1.  AKI on CKD 3b/IV: getting better with diuresis actually.  Aortogram is an essential procedure- agree with holding PM lasix dose, don't give any fluids- dont' think he could tolerate.  No ACEI/ARB or other potentially nephrotoxic agents.  Have discussed with him the risks including needing dialysis as a result of the procedure.  He understands and accepts these risks.  He requests that I update his wife which is what I will do Mardene Celeste). Will be following for dialytic needs.  2.  PAD: aortogram tomorrow  3. A Fib: on Coumadin  4.  Dispo: pending  Madelon Lips 11/18/2019, 4:37 PM

## 2019-11-18 NOTE — Progress Notes (Signed)
ANTICOAGULATION CONSULT NOTE  Pharmacy Consult for heparin Indication: AFib/PVD  No Known Allergies  Patient Measurements: Height: 6\' 6"  (198.1 cm) Weight: 195 lb (88.5 kg) IBW/kg (Calculated) : 91.4 Heparin Dosing Weight: 88kg  Vital Signs: Temp: 97.7 F (36.5 C) (03/15 0410) Temp Source: Oral (03/15 0410) BP: 117/67 (03/15 0410) Pulse Rate: 77 (03/15 0410)  Labs: Recent Labs    11/15/19 1110 11/15/19 1110 11/15/19 1318 11/15/19 1430 11/15/19 2333 11/15/19 2333 11/16/19 0256 11/16/19 1816 11/17/19 0448 11/17/19 1555 11/18/19 0522  HGB 9.2*   < >  --   --  9.1*   < > 9.0*  --  9.4*  --   --   HCT 29.3*   < >  --   --  27.5*  --  27.0*  --  28.0*  --   --   PLT 270   < >  --   --  261  --  254  --  280  --   --   HEPARINUNFRC  --   --   --   --   --   --  0.51   < > 0.75* 0.78* 0.30  CREATININE 2.56*  --   --   --   --   --  2.74*  --  2.43*  --   --   TROPONINIHS  --   --  7 8  --   --   --   --   --   --   --    < > = values in this interval not displayed.    Estimated Creatinine Clearance: 32.4 mL/min (A) (by C-G formula based on SCr of 2.43 mg/dL (H)).  Assessment: 79 y.o. male with h/o Afib. Eliquis on hold since discharge of previous admission on 3/5, for heparin.  Heparin level lower end therapeutic this AM s/p rate decrease to 900 units/hr, chronic anemia stable.    Goal of Therapy:  Heparin level 0.3-0.7 units/ml Monitor platelets by anticoagulation protocol: Yes   Plan:  Increase heparin gtt slightly to 950 units/hr Daily heparin level, CBC, s/s bleeding  Bertis Ruddy, PharmD Clinical Pharmacist Please check AMION for all [facility] numbers 11/18/2019 7:36 AM

## 2019-11-18 NOTE — Progress Notes (Signed)
I responded to consult for pastoral support. Pt was sitting up on side of his bed. His mood was emotional. He says he is struggling with not knowing what will happen to him. Graham said he is scheduled for a procedure that will hopefully aid in diagnosing why his leg is so numb.  He says he wishes people would give to him straight concerning his health. I was present listening to his concerns and questions. Bain was in tears. I offered him support and he requested continued Chaplain support at end of the week. Chaplain will follow up at later time.   Palliative Care Chaplain Resident Fidel Levy [phone]

## 2019-11-18 NOTE — Progress Notes (Signed)
Initial Nutrition Assessment  RD working remotely.  DOCUMENTATION CODES:   Not applicable  INTERVENTION:   -Magic cup BID with meals, each supplement provides 290 kcal and 9 grams of protein -Ensure Enlive po TID, each supplement provides 350 kcal and 20 grams of protein -MVI with minerals daily  NUTRITION DIAGNOSIS:   Increased nutrient needs related to chronic illness(CHF) as evidenced by estimated needs.  GOAL:   Patient will meet greater than or equal to 90% of their needs  MONITOR:   PO intake, Supplement acceptance, Labs, Weight trends, Skin, I & O's  REASON FOR ASSESSMENT:   Consult Assessment of nutrition requirement/status  ASSESSMENT:   Logan White is a 79 y.o. male with medical history significant of afib on Coumadin; PVD; HLD; h/o CVA; DM; CAD s/p stent; stage 4 CKD; and AAA presenting with B leg pain.  He reports that he is here due to his R > L legs - pain, they go numb on him.  He reports that no one has told him anything about his legs, just tried to get the fluid down on previous visits.  He has increased LE a couple of days ago.  He is SOB.  Since he left the hospital last Thursday, he had no problems until yesterday.  His R leg went numb and then the feeling came back and he went back inside.  He woke up this AM and his legs were going numb on him.  He tried getting dressed this AM and he just couldn't do it because of leg numbness.  The SOB is a secondary issue.  No orthopnea.  He got extra fluid pills from Dr. [name] and he started peeing more yesterday.  Pt admitted with significant PVD.   Reviewed I/O's: -1.1 L x 24 hours and -909 ml since admission  UOP: 2 L x 24 hours  Attempted to speak with pt via phone, however, no answer.   Per [facility]VS notes, pt with known rt external iliac artery occusion with sever infrainguinal arterial occlusive diease on the right and peroneal runoff only as well as superficial femoral artery occlusion with diffuse  infrainguinal arterial occlusive diease. Plan for angiogram tomorrow (11/19/19).   Pt with fair appetite; noted meal completion 70-75%.   Reviewed wt hx, which reveals progressive wt gain over the past few months. Per RN assessment, pt with severe edema, which is likely contributing to weight gain and is likely masking fat and muscle depletions.   Medications reviewed and include lasix.   Palliative care consulted for goals of care.   Labs reviewed.   Diet Order:   Diet Order            Diet Heart Room service appropriate? Yes; Fluid consistency: Thin  Diet effective now              EDUCATION NEEDS:   No education needs have been identified at this time  Skin:  Skin Assessment: Reviewed RN Assessment  Last BM:  11/16/19  Height:   Ht Readings from Last 1 Encounters:  11/15/19 6\' 6"  (1.981 m)    Weight:   Wt Readings from Last 1 Encounters:  11/18/19 88.5 kg    Ideal Body Weight:  97.3 kg  BMI:  Body mass index is 22.53 kg/m.  Estimated Nutritional Needs:   Kcal:  2992-4268  Protein:  130-145 grams  Fluid:  > 2.4 L    Loistine Chance, RD, LDN, [place] Registered Dietitian II Certified Diabetes Care and Education  Specialist Please refer to [facility] for RD and/or RD on-call/weekend/after hours pager

## 2019-11-18 NOTE — TOC Initial Note (Addendum)
Transition of Care [facility]) - Initial/Assessment Note    Patient Details  Name: Logan White MRN: [id number] Date of Birth: Dec 16, 1940  Transition of Care [facility]) CM/SW Contact:    Zenon Mayo, RN Phone Number: 11/18/2019, 10:05 AM  Clinical Narrative:                 Patient is form home with wife, he has walker, cane and home oxygen with [employer].  He is active with [facility] for [facility] and would like to continue with [facility].  Will need resumption orders for [facility] prior to dc.  He has transportation at [employer]. [facility] [address]  He has not issues with getting medications. Per OT rec shower chair, patient states he will get this from [facility].   Expected Discharge Plan: [place] Barriers to Discharge: Continued Medical Work up   Patient Goals and CMS Choice Patient states their goals for this hospitalization and ongoing recovery are:: to get better CMS Medicare.gov Compare Post Acute Care list provided to:: Patient Choice offered to / list presented to : Patient  Expected Discharge Plan and Services Expected Discharge Plan: [place] In-house Referral: NA Discharge Planning Services: CM Consult Post Acute Care Choice: Resumption of Svcs/PTA Provider Living arrangements for the past 2 months: Single Family Home                 DME Arranged: (NA)         HH Arranged: RN, Disease Management [place] Agency: [facility] Date [place]: 11/18/19 Time [place]: 1004 Representative spoke with at [place]: [place] Arrangements/Services Living arrangements for the past 2 months: [place] with:: Spouse Patient language and need for interpreter reviewed:: Yes Do you feel safe going back to the place where you live?: Yes      Need for Family Participation in Patient Care: Yes (Comment) Care giver support system in place?: Yes (comment) Current home services: DME, Home  RN(has home oxygen with [employer], has walker and a cane, has HHRN with [facility],) Criminal Activity/Legal Involvement Pertinent to Current Situation/Hospitalization: No - Comment as needed  Activities of Daily Living Home Assistive Devices/Equipment: Walker (specify type) ADL Screening (condition at time of admission) Patient's cognitive ability adequate to safely complete daily activities?: No Is the patient deaf or have difficulty hearing?: No Does the patient have difficulty seeing, even when wearing glasses/contacts?: No Does the patient have difficulty concentrating, remembering, or making decisions?: No Patient able to express need for assistance with ADLs?: Yes Does the patient have difficulty dressing or bathing?: No Independently performs ADLs?: No Communication: Independent Dressing (OT): Needs assistance Is this a change from baseline?: Change from baseline, expected to last >3 days Grooming: Needs assistance Is this a change from baseline?: Change from baseline, expected to last >3 days Feeding: Needs assistance Is this a change from baseline?: Change from baseline, expected to last >3 days Bathing: Needs assistance Is this a change from baseline?: Change from baseline, expected to last >3 days Toileting: Needs assistance Is this a change from baseline?: Change from baseline, expected to last >3days In/Out Bed: Needs assistance Is this a change from baseline?: Change from baseline, expected to last >3 days Walks in Home: Independent Does the patient have difficulty walking or climbing stairs?: Yes Weakness of Legs: Both Weakness of Arms/Hands: None  Permission Sought/Granted Permission sought to share information with : Family Supports(wife)  Emotional Assessment Appearance:: Appears stated age Attitude/Demeanor/Rapport: Engaged Affect (typically observed): Appropriate Orientation: : Oriented to Self, Oriented to Place, Oriented to  Time, Oriented  to Situation Alcohol / Substance Use: Not Applicable Psych Involvement: No (comment)  Admission diagnosis:  PVD (peripheral vascular disease) ([place]) [I73.9] CHF (congestive heart failure), NYHA class IV, acute, systolic (HCC) [G26.94] Acute renal failure superimposed on stage 4 chronic kidney disease, unspecified acute renal failure type ([place]) [N17.9, N18.4] Patient Active Problem List   Diagnosis Date Noted  . Palliative care by specialist   . Goals of care, counseling/discussion   . PVD (peripheral vascular disease) ([place]) 11/15/2019  . Gastritis and gastroduodenitis   . Benign neoplasm of transverse colon   . Microcytic anemia   . Acute blood loss anemia   . Melena   . CHF (congestive heart failure) ([place]) 11/04/2019  . CHF (congestive heart failure), NYHA class IV, acute, systolic ([place]) 85/46/2703  . Persistent atrial fibrillation ([place])   . Acute on chronic systolic CHF (congestive heart failure) ([place]) 06/22/2019  . Acute on chronic systolic (congestive) heart failure ([place]) 06/22/2019  . CAD S/P PCI 10/13/2015  . Cardiomyopathy, new. Etiology apeears to be NICM 10/13/2015  . AAA (abdominal aortic aneurysm) ([place])   . Peripheral vascular disease ([place])   . Carotid artery stenosis   . Hypertensive heart disease   . Bruit 10/24/2014  . Acute on chronic renal failure ([place]) 01/11/2013  . Acute MI, anterolateral wall, initial episode of care ([place]) 12/31/2012  . Cardiac arrest due to underlying cardiac condition ([place]) 12/31/2012  . VF (ventricular fibrillation) ([place]) 12/31/2012  . Abnormal LFTs 12/31/2012  . Acute on chronic renal insufficiency 12/31/2012  . Chronic anticoagulation 12/31/2012  . Hypokalemia 12/31/2012  . Weakness 04/23/2012  . Long term (current) use of anticoagulants 03/19/2012  . CKD (chronic kidney disease), stage III 03/16/2012  . Acute confusion/delerium 03/12/2012  . Delirium 03/12/2012  . Acute systolic CHF (congestive heart failure) ([place]) 03/11/2012  .  Septic shock(785.52) 02/27/2012    Class: Acute  . Cardiogenic shock ([place]) 02/25/2012  . Acute renal failure ([place]) 02/25/2012  . Anoxic encephalopathy ([place]) 02/25/2012  . Ischemic cardiomyopathy 02/25/2012  . Cardiac arrest ([place]) 02/23/2012  . Acute respiratory failure with hypoxia ([place]) 02/23/2012  . Ventricular fibrillation ([place]) 02/23/2012  . DM (diabetes mellitus) ([place]) 02/23/2012  . Screening for colon cancer 02/10/2011  . Hematemesis 01/13/2011  . Dyspnea 01/13/2011  . RENAL INSUFFICIENCY 08/26/2010  . HYPERPOTASSEMIA 09/15/2009  . TOBACCO ABUSE 08/11/2009  . GLUCOSE INTOLERANCE 10/02/2008  . Hyperlipidemia 10/02/2008  . Essential hypertension 10/02/2008  . Cerebrovascular disease 10/02/2008  . PERIPHERAL VASCULAR DISEASE 10/02/2008  . INGUINAL HERNIA 10/02/2008   PCP:  Patient, No Pcp Per Pharmacy:   [facility], [place] [address] [place] [address] Phone: [phone] Fax: [phone]  [id number] - [place], [place] [address] Phone: [phone] Fax: [phone]  [facility] [place] [address] Phone: [phone] Fax: [phone]     Social Determinants of Health (SDOH) Interventions    Readmission Risk Interventions Readmission Risk Prevention Plan 11/18/2019 09/30/2019  Transportation Screening Complete Complete  PCP or Specialist Appt within 3-5 Days - Complete  HRI or [place] - Complete  Social Work Consult for [place] Planning/Counseling - Complete  Palliative Care Screening - Not Applicable  Medication Review (RN  Care Manager) Complete Complete  PCP or Specialist appointment within 3-5 days of discharge Complete -  [place] or Home Care Consult Complete -  SW Recovery Care/Counseling Consult Complete -  Palliative Care Screening Complete -  [place] Not Applicable -  Some recent data might be hidden

## 2019-11-18 NOTE — Progress Notes (Addendum)
Progress Note  Patient Name: Logan White Date of Encounter: 11/18/2019  Primary Cardiologist: Kirk Ruths, MD   Subjective  Logan White is a 79 y.o. male with a hx of permanent Afib, CAD, Vfib arrest, ICM and CHF, PAD with advanced ischemic symptoms of the right foot, chronic kidney disease stage IV who is being seen today for the evaluation of CHF at the request of Dr. Cyndia Skeeters.  He is having a rough day today.  He is intermittently angry and tearful.  Multiple serious diagnoses have been revealed to him in a very short period of time. Inpatient Medications    Scheduled Meds: . atorvastatin  80 mg Oral Daily  . metoprolol succinate  50 mg Oral Daily  . mometasone-formoterol  2 puff Inhalation BID  . pantoprazole  40 mg Oral BID  . potassium chloride SA  20 mEq Oral BID  . sodium chloride flush  3 mL Intravenous Q12H   Continuous Infusions: . sodium chloride    . heparin 950 Units/hr (11/18/19 0752)   PRN Meds: sodium chloride, acetaminophen, albuterol, fentaNYL (SUBLIMAZE) injection, ondansetron (ZOFRAN) IV, sodium chloride flush   Vital Signs    Vitals:   11/18/19 0410 11/18/19 0500 11/18/19 0717 11/18/19 0832  BP: 117/67   118/72  Pulse: 77   86  Resp: 16   18  Temp: 97.7 F (36.5 C)   98 F (36.7 C)  TempSrc: Oral   Oral  SpO2: 99%  95% 100%  Weight:  88.5 kg    Height:        Intake/Output Summary (Last 24 hours) at 11/18/2019 1017 Last data filed at 11/18/2019 0827 Gross per 24 hour  Intake 1050.46 ml  Output 1750 ml  Net -699.54 ml   Last 3 Weights 11/18/2019 11/17/2019 11/16/2019  Weight (lbs) 195 lb 194 lb 12.8 oz 196 lb 12.8 oz  Weight (kg) 88.451 kg 88.361 kg 89.268 kg  Some encounter information is confidential and restricted. Go to Review Flowsheets activity to see all data.      Telemetry    PAF. PVCs and 7 beat run of NSVT. - Personally Reviewed  ECG    No new- Personally Reviewed  Physical Exam   GEN: No acute distress.    Neck: No JVD Cardiac:  Irregular, no murmurs, rubs, or gallops.  Respiratory: expiratory wheeze and coughing GI: Soft, nontender, non-distended  MS:  2-3+ edema of the thighs symmetrically, less significant calf edema; No deformity.  Unable to palpate pulses in right foot Neuro:  Nonfocal  Psych: Normal affect   Labs    High Sensitivity Troponin:   Recent Labs  Lab 11/15/19 1318 11/15/19 1430  TROPONINIHS 7 8      Chemistry Recent Labs  Lab 11/16/19 0256 11/17/19 0448 11/18/19 0522  NA 134* 134* 138  K 4.3 4.0 4.2  CL 101 100 100  CO2 18* 20* 23  GLUCOSE 139* 128* 100*  BUN 49* 50* 44*  CREATININE 2.74* 2.43* 2.34*  CALCIUM 8.6* 8.8* 9.3  ALBUMIN  --  3.0* 3.2*  GFRNONAA 22* 25* 26*  GFRAA 25* 29* 30*  ANIONGAP 15 14 15      Hematology Recent Labs  Lab 11/16/19 0256 11/16/19 1501 11/17/19 0448 11/18/19 0522  WBC 8.2  --  8.9 9.2  RBC 3.51* 3.83* 3.67* 3.77*  HGB 9.0*  --  9.4* 9.6*  HCT 27.0*  --  28.0* 29.2*  MCV 76.9*  --  76.3* 77.5*  MCH 25.6*  --  25.6* 25.5*  MCHC 33.3  --  33.6 32.9  RDW 21.1*  --  20.8* 21.1*  PLT 254  --  280 263    BNP Recent Labs  Lab 11/15/19 1110  BNP 2,374.7*      Radiology    No results found.  DG Chest Portable 1 View  Result Date: 11/15/2019 CLINICAL DATA:  Shortness of breath and bilateral leg swelling for a few days. EXAM: PORTABLE CHEST 1 VIEW COMPARISON:  The single-view of the chest 11/04/2019 and 09/28/2019. FINDINGS: There is cardiomegaly mild interstitial edema. Extensive atherosclerosis. No pneumothorax or pleural fluid. No acute bony abnormality. IMPRESSION: Cardiomegaly and interstitial edema. Atherosclerosis. Electronically Signed   By: Inge Rise M.D.   On: 11/15/2019 13:06   VAS [place] ABI WITH/WO TBI  Result Date: 11/05/2019 LOWER EXTREMITY DOPPLER STUDY Indications: Claudication, and peripheral artery disease.  Limitations: Today's exam was limited due to involuntary patient movement.  Comparison Study: AAA ultrasound 03-15-19. Performing Technologist: Baldwin Crown RDMS  Examination Guidelines: A complete evaluation includes at minimum, Doppler waveform signals and systolic blood pressure reading at the level of bilateral brachial, anterior tibial, and posterior tibial arteries, when vessel segments are accessible. Bilateral testing is considered an integral part of a complete examination. Photoelectric Plethysmograph (PPG) waveforms and toe systolic pressure readings are included as required and additional duplex testing as needed. Limited examinations for reoccurring indications may be performed as noted.  ABI Findings: +---------+------------------+-----+---------+--------+ Right    Rt Pressure (mmHg)IndexWaveform Comment  +---------+------------------+-----+---------+--------+ Brachial 120                    triphasic         +---------+------------------+-----+---------+--------+ PTA                             absent            +---------+------------------+-----+---------+--------+ DP                              absent            +---------+------------------+-----+---------+--------+ Great Toe                       Absent            +---------+------------------+-----+---------+--------+ +---------+------------------+-----+---------+---------------+ Left     Lt Pressure (mmHg)IndexWaveform Comment         +---------+------------------+-----+---------+---------------+ Brachial 122                    triphasic                +---------+------------------+-----+---------+---------------+ PTA      43                0.35          possibly venous +---------+------------------+-----+---------+---------------+ DP                                       possibly venous +---------+------------------+-----+---------+---------------+ Great Toe                       Absent                    +---------+------------------+-----+---------+---------------+  Summary: Right: Unable to innsonate PTA or DP  due to low velocity vs nonexistant waveforms. Left: Resting left ankle-brachial index indicates severe left lower extremity arterial disease. Due to irregularity of waveforms unable to distingusihed between arterial vs venous flow.  *See table(s) above for measurements and observations.  Electronically signed by Deitra Mayo MD on 11/05/2019 at 4:42:49 PM.   Final    VAS [place] LOWER EXTREMITY VENOUS (DVT) (MC and WL 7a-7p)  Result Date: 11/16/2019  Lower Venous DVTStudy Indications: Edema, and SOB.  Comparison Study: No prior study Performing Technologist: Maudry Mayhew MHA, RDMS, RVT, RDCS  Examination Guidelines: A complete evaluation includes B-mode imaging, spectral Doppler, color Doppler, and power Doppler as needed of all accessible portions of each vessel. Bilateral testing is considered an integral part of a complete examination. Limited examinations for reoccurring indications may be performed as noted. The reflux portion of the exam is performed with the patient in reverse Trendelenburg.  +---------+---------------+---------+-----------+----------+--------------+ RIGHT    CompressibilityPhasicitySpontaneityPropertiesThrombus Aging +---------+---------------+---------+-----------+----------+--------------+ CFV      Full           Yes      Yes                                 +---------+---------------+---------+-----------+----------+--------------+ SFJ      Full                                                        +---------+---------------+---------+-----------+----------+--------------+ FV Prox  Full                                                        +---------+---------------+---------+-----------+----------+--------------+ FV Mid   Full                                                         +---------+---------------+---------+-----------+----------+--------------+ FV DistalFull                                                        +---------+---------------+---------+-----------+----------+--------------+ PFV      Full                                                        +---------+---------------+---------+-----------+----------+--------------+ POP      Full           Yes      Yes                                 +---------+---------------+---------+-----------+----------+--------------+ PTV      Full                                                        +---------+---------------+---------+-----------+----------+--------------+  PERO     Full                                                        +---------+---------------+---------+-----------+----------+--------------+   +---------+---------------+---------+-----------+----------+--------------+ LEFT     CompressibilityPhasicitySpontaneityPropertiesThrombus Aging +---------+---------------+---------+-----------+----------+--------------+ CFV      Full           Yes      Yes                                 +---------+---------------+---------+-----------+----------+--------------+ SFJ      Full                                                        +---------+---------------+---------+-----------+----------+--------------+ FV Prox  Full                                                        +---------+---------------+---------+-----------+----------+--------------+ FV Mid   Full                                                        +---------+---------------+---------+-----------+----------+--------------+ FV DistalFull                                                        +---------+---------------+---------+-----------+----------+--------------+ PFV      Full                                                         +---------+---------------+---------+-----------+----------+--------------+ POP      Full           Yes      Yes                                 +---------+---------------+---------+-----------+----------+--------------+ PTV      Full                                                        +---------+---------------+---------+-----------+----------+--------------+ PERO     Full                                                        +---------+---------------+---------+-----------+----------+--------------+  Summary: RIGHT: - There is no evidence of deep vein thrombosis in the lower extremity.  - No cystic structure found in the popliteal fossa. - Ultrasound characteristics of enlarged lymph nodes are noted in the groin.  LEFT: - There is no evidence of deep vein thrombosis in the lower extremity.  - No cystic structure found in the popliteal fossa.  Pulstaile venous flow is noted bilaterally, suggestive of possible elevated right heart pressure. *See table(s) above for measurements and observations. Electronically signed by Harold Barban MD on 11/16/2019 at 34:07:56 PM.    Final      Cardiac Studies   Relevant CV Studies: RHC: 10/02/2019 Conclusion  Moderate pulmonary hypertension with mean PA pressure of 42 mmHg.  Mean pulmonary capillary wedge pressure 28 mmHg.  Phasic pulmonary artery pressure 63/29 mmHg, mean RA pressure 20 mmHg, and pulmonary artery pulsatility index is 1.7 (suggesting RV dysfunction)  Mixed venous O2 saturation 57%   ECHO: 09/29/19 1. Left ventricular ejection fraction, by visual estimation, is 25 to  30%. The left ventricle has severely decreased function. There is mildly  increased left ventricular hypertrophy.  2. Left ventricular diastolic function could not be evaluated.  3. Severely dilated left ventricular internal cavity size.  4. The left ventricle demonstrates global hypokinesis.  5. Global right ventricle has severely reduced  systolic function.The  right ventricular size is mildly enlarged.  6. Left atrial size was severely dilated.  7. The interatrial septum is aneurysmal.  8. Right atrial size was severely dilated.  9. The mitral valve is normal in structure. Moderate mitral valve  regurgitation. No evidence of mitral stenosis.  10. The tricuspid valve is normal in structure. Tricuspid valve  regurgitation is mild.  11. The aortic valve is tricuspid. Aortic valve regurgitation is mild.  Mild to moderate aortic valve sclerosis/calcification without any evidence  of aortic stenosis.  12. The pulmonic valve was normal in structure. Pulmonic valve  regurgitation is mild.  13. Moderately elevated pulmonary artery systolic pressure.  14. The inferior vena cava is dilated in size with <50% respiratory  variability, suggesting right atrial pressure of 15 mmHg.  15. Severe global reduction in LV systolic function; mild LVH; 4 chamber  enlargement; severe RV dysfunction; mild AI; moderate MR; mild TR;  moderate pulmonary hypertension.    Patient Profile     Logan White is a 79 y.o. male with a hx of permanent Afib, CAD, Vfib arrest, ICM and CHFwho is being seen today for the evaluation of CHF at the request of Dr. Cyndia Skeeters.  Assessment & Plan    1. CHF: SOB/DOE/acute on chronic systolic congestive heart failure- - normal wt approx 183 lbs, was 194 lbs on admit, now 195 lbs - I/O net -869 cc since admit - will need twice daily diuretic for adequate response, but will administer only AM dose today in anticipation of angiography tomorrow. No RAAS inhibitors. On beta blocker. 2.  Acute on CKD3b: Chronic kidney disease, baseline creatinine appears to be 2.0-2.3 based on values from December-January. - Creatinine today is slowly improving last few days.. 3.  PAD: for R foot angio tomorrow. 4.  Fe def anemia: no overt bleeding. May also have a component of EPO deficiency. Hgb substantially lower from January,  but stable during this admission. 5.  AFib: questionable candidate for long term anticoagulation due to iron deficiency. IV heparin for now.  6.  Mitral regurgitation-moderate on recent echo      For questions or updates, please contact  [facility] Please consult [email or web address] for contact info under        Signed, Rosaria Ferries, PA-C  11/18/2019, 10:17 AM

## 2019-11-18 NOTE — Progress Notes (Signed)
  Progress Note    11/18/2019 9:39 AM   Subjective:  Comfortable. Resting in bed with legs dangling off the side of bed   Vitals:   11/18/19 0717 11/18/19 0832  BP:  118/72  Pulse:  86  Resp:  18  Temp:  98 F (36.7 C)  SpO2: 95% 100%   Physical Exam: General: well appearing, not in any distress Lungs: non labored Extremities: 2+ bilateral femoral pulses, no distal pulses palpable, cool bilateral feet. Motor and sensation intact Neurologic: Alert and oriented  CBC    Component Value Date/Time   WBC 9.2 11/18/2019 0522   RBC 3.77 (L) 11/18/2019 0522   HGB 9.6 (L) 11/18/2019 0522   HGB 12.7 (L) 07/29/2019 1004   HCT 29.2 (L) 11/18/2019 0522   HCT 38.3 07/29/2019 1004   PLT 263 11/18/2019 0522   PLT 191 07/29/2019 1004   MCV 77.5 (L) 11/18/2019 0522   MCV 80 07/29/2019 1004   MCH 25.5 (L) 11/18/2019 0522   MCHC 32.9 11/18/2019 0522   RDW 21.1 (H) 11/18/2019 0522   RDW 15.6 (H) 07/29/2019 1004   LYMPHSABS 1.3 11/15/2019 2333   MONOABS 0.9 11/15/2019 2333   EOSABS 0.1 11/15/2019 2333   BASOSABS 0.0 11/15/2019 2333    BMET    Component Value Date/Time   NA 138 11/18/2019 0522   NA 139 10/29/2019 1139   K 4.2 11/18/2019 0522   CL 100 11/18/2019 0522   CO2 23 11/18/2019 0522   GLUCOSE 100 (H) 11/18/2019 0522   BUN 44 (H) 11/18/2019 0522   BUN 37 (H) 10/29/2019 1139   CREATININE 2.34 (H) 11/18/2019 0522   CREATININE 1.34 (H) 10/31/2016 0844   CALCIUM 9.3 11/18/2019 0522   GFRNONAA 26 (L) 11/18/2019 0522   GFRNONAA 46 (L) 10/24/2014 0943   GFRAA 30 (L) 11/18/2019 0522   GFRAA 53 (L) 10/24/2014 0943    INR    Component Value Date/Time   INR 2.5 (H) 09/30/2019 1754     Intake/Output Summary (Last 24 hours) at 11/18/2019 0939 Last data filed at 11/18/2019 0827 Gross per 24 hour  Intake 1050.46 ml  Output 1750 ml  Net -699.54 ml     Assessment/Plan:  79 y.o. male has chronic peripheral vascular disease with right lower extremity rest pain. Scheduled  for Abdominal Angiogram with right lower extremity runoff 3/16 with Dr. Trula Slade. Hold Eliquis. Consent order placed. NPO after midnight    Karoline Caldwell, [facility] [phone] 11/18/2019 9:39 AM

## 2019-11-19 ENCOUNTER — Encounter (HOSPITAL_COMMUNITY)
Admission: EM | Disposition: A | Discharge status: Home Health | Payer: Self-pay | Source: Home / Self Care | Attending: Internal Medicine

## 2019-11-19 DIAGNOSIS — I70221 Atherosclerosis of native arteries of extremities with rest pain, right leg: Secondary | ICD-10-CM

## 2019-11-19 HISTORY — PX: LOWER EXTREMITY ANGIOGRAPHY: CATH118251

## 2019-11-19 HISTORY — PX: ABDOMINAL AORTOGRAM: CATH118222

## 2019-11-19 LAB — RENAL FUNCTION PANEL
Albumin: 2.9 g/dL — ABNORMAL LOW (ref 3.5–5.0)
Anion gap: 10 (ref 5–15)
BUN: 43 mg/dL — ABNORMAL HIGH (ref 8–23)
CO2: 26 mmol/L (ref 22–32)
Calcium: 9 mg/dL (ref 8.9–10.3)
Chloride: 101 mmol/L (ref 98–111)
Creatinine, Ser: 2.32 mg/dL — ABNORMAL HIGH (ref 0.61–1.24)
GFR calc Af Amer: 30 mL/min — ABNORMAL LOW (ref >60)
GFR calc non Af Amer: 26 mL/min — ABNORMAL LOW (ref >60)
Glucose, Bld: 121 mg/dL — ABNORMAL HIGH (ref 70–99)
Phosphorus: 3.4 mg/dL (ref 2.5–4.6)
Potassium: 4.5 mmol/L (ref 3.5–5.1)
Sodium: 137 mmol/L (ref 135–145)

## 2019-11-19 LAB — CBC
HCT: 27.6 % — ABNORMAL LOW (ref 39.0–52.0)
Hemoglobin: 9.1 g/dL — ABNORMAL LOW (ref 13.0–17.0)
MCH: 25.6 pg — ABNORMAL LOW (ref 26.0–34.0)
MCHC: 33 g/dL (ref 30.0–36.0)
MCV: 77.7 fL — ABNORMAL LOW (ref 80.0–100.0)
Platelets: 255 10*3/uL (ref 150–400)
RBC: 3.55 MIL/uL — ABNORMAL LOW (ref 4.22–5.81)
RDW: 20.9 % — ABNORMAL HIGH (ref 11.5–15.5)
WBC: 8.7 10*3/uL (ref 4.0–10.5)
nRBC: 0.3 % — ABNORMAL HIGH (ref 0.0–0.2)

## 2019-11-19 LAB — MAGNESIUM: Magnesium: 2.1 mg/dL (ref 1.7–2.4)

## 2019-11-19 LAB — HEPARIN LEVEL (UNFRACTIONATED)
Heparin Unfractionated: 0.29 IU/mL — ABNORMAL LOW (ref 0.30–0.70)
Heparin Unfractionated: 0.19 IU/mL — ABNORMAL LOW (ref 0.30–0.70)

## 2019-11-19 SURGERY — ABDOMINAL AORTOGRAM
Anesthesia: LOCAL

## 2019-11-19 MED ORDER — MIDAZOLAM HCL 2 MG/2ML IJ SOLN
INTRAMUSCULAR | Status: DC | PRN
  Administered 2019-11-19: 2 mg via INTRAVENOUS

## 2019-11-19 MED ORDER — HYDRALAZINE HCL 20 MG/ML IJ SOLN: 5.0000 mg | INTRAMUSCULAR | Status: DC | PRN

## 2019-11-19 MED ORDER — HEPARIN (PORCINE) IN NACL 1000-0.9 UT/500ML-% IV SOLN
INTRAVENOUS | Status: AC
  Filled 2019-11-19: qty 500

## 2019-11-19 MED ORDER — LABETALOL HCL 5 MG/ML IV SOLN: 10.0000 mg | INTRAVENOUS | Status: DC | PRN

## 2019-11-19 MED ORDER — FENTANYL CITRATE (PF) 100 MCG/2ML IJ SOLN
INTRAMUSCULAR | Status: DC | PRN
  Administered 2019-11-19: 50 ug via INTRAVENOUS

## 2019-11-19 MED ORDER — SODIUM CHLORIDE 0.9% FLUSH: 3.0000 mL | INTRAVENOUS | Status: DC | PRN

## 2019-11-19 MED ORDER — HEPARIN (PORCINE) IN NACL 1000-0.9 UT/500ML-% IV SOLN
INTRAVENOUS | Status: DC | PRN
  Administered 2019-11-19 (×2): 500 mL

## 2019-11-19 MED ORDER — MIDAZOLAM HCL 2 MG/2ML IJ SOLN
INTRAMUSCULAR | Status: AC
  Filled 2019-11-19: qty 2

## 2019-11-19 MED ORDER — LIDOCAINE HCL (PF) 1 % IJ SOLN
INTRAMUSCULAR | Status: DC | PRN
  Administered 2019-11-19: 15 mL

## 2019-11-19 MED ORDER — SODIUM CHLORIDE 0.9 % WEIGHT BASED INFUSION
1.0000 mL/kg/h | INTRAVENOUS | Status: DC
  Administered 2019-11-19 (×2): 1 mL/kg/h via INTRAVENOUS

## 2019-11-19 MED ORDER — FENTANYL CITRATE (PF) 100 MCG/2ML IJ SOLN
INTRAMUSCULAR | Status: AC
  Filled 2019-11-19: qty 2

## 2019-11-19 MED ORDER — SODIUM CHLORIDE 0.9% FLUSH
3.0000 mL | Freq: Two times a day (BID) | INTRAVENOUS | Status: DC
  Administered 2019-11-20: 3 mL via INTRAVENOUS

## 2019-11-19 MED ORDER — SODIUM CHLORIDE 0.9 % IV SOLN: INTRAVENOUS | Status: DC

## 2019-11-19 MED ORDER — ACETAMINOPHEN 325 MG PO TABS
650.0000 mg | ORAL_TABLET | ORAL | Status: DC | PRN
  Administered 2019-11-19 – 2019-11-20 (×2): 650 mg via ORAL
  Filled 2019-11-19: qty 2

## 2019-11-19 MED ORDER — ASPIRIN EC 81 MG PO TBEC
81.0000 mg | DELAYED_RELEASE_TABLET | Freq: Every day | ORAL | Status: DC
  Administered 2019-11-19 – 2019-11-21 (×3): 81 mg via ORAL
  Filled 2019-11-19 (×3): qty 1

## 2019-11-19 MED ORDER — IODIXANOL 320 MG/ML IV SOLN
INTRAVENOUS | Status: DC | PRN
  Administered 2019-11-19: 15 mL via INTRA_ARTERIAL

## 2019-11-19 MED ORDER — ONDANSETRON HCL 4 MG/2ML IJ SOLN: 4.0000 mg | Freq: Four times a day (QID) | INTRAMUSCULAR | Status: DC | PRN

## 2019-11-19 MED ORDER — SODIUM CHLORIDE 0.9 % IV SOLN: 250.0000 mL | INTRAVENOUS | Status: DC | PRN

## 2019-11-19 MED ORDER — LIDOCAINE HCL (PF) 1 % IJ SOLN
INTRAMUSCULAR | Status: AC
  Filled 2019-11-19: qty 30

## 2019-11-19 MED ORDER — TORSEMIDE 100 MG PO TABS
100.0000 mg | ORAL_TABLET | Freq: Two times a day (BID) | ORAL | Status: DC
  Administered 2019-11-19 – 2019-11-20 (×3): 100 mg via ORAL
  Filled 2019-11-19 (×2): qty 1

## 2019-11-19 SURGICAL SUPPLY — 13 items
CATH OMNI FLUSH 5F 65CM (CATHETERS) ×1 IMPLANT
CLOSURE MYNX CONTROL 5F (Vascular Products) ×1 IMPLANT
FILTER CO2 0.2 MICRON (VASCULAR PRODUCTS) ×1 IMPLANT
KIT MICROPUNCTURE NIT STIFF (SHEATH) ×1 IMPLANT
KIT PV (KITS) ×3 IMPLANT
RESERVOIR CO2 (VASCULAR PRODUCTS) ×1 IMPLANT
SET FLUSH CO2 (MISCELLANEOUS) ×1 IMPLANT
SHEATH PINNACLE 5F 10CM (SHEATH) ×1 IMPLANT
SHEATH PROBE COVER 6X72 (BAG) ×1 IMPLANT
SYR MEDRAD MARK V 150ML (SYRINGE) ×1 IMPLANT
TRANSDUCER W/STOPCOCK (MISCELLANEOUS) ×3 IMPLANT
TRAY PV CATH (CUSTOM PROCEDURE TRAY) ×3 IMPLANT
WIRE BENTSON .035X145CM (WIRE) ×1 IMPLANT

## 2019-11-19 NOTE — Progress Notes (Signed)
Logan White for heparin Indication: AFib/PVD  No Known Allergies  Patient Measurements: Height: 6\' 6"  (198.1 cm) Weight: 196 lb 11.2 oz (89.2 kg)(scale a) IBW/kg (Calculated) : 91.4 Heparin Dosing Weight: 88kg  Vital Signs: Temp: 98.6 F (37 C) (03/16 0847) Temp Source: Oral (03/16 0847) BP: 126/70 (03/16 0902) Pulse Rate: 81 (03/16 0902)  Labs: Recent Labs    11/17/19 0448 11/17/19 0448 11/17/19 1555 11/18/19 0522 11/19/19 0306  HGB 9.4*   < >  --  9.6* 9.1*  HCT 28.0*  --   --  29.2* 27.6*  PLT 280  --   --  263 255  HEPARINUNFRC 0.75*   < > 0.78* 0.30 0.29*  CREATININE 2.43*  --   --  2.34* 2.32*   < > = values in this interval not displayed.    Estimated Creatinine Clearance: 34.2 mL/min (A) (by C-G formula based on SCr of 2.32 mg/dL (H)).  Assessment: 79 y.o. male with h/o Afib. Eliquis on hold since discharge of previous admission on 3/5, for heparin.  Heparin level slightly subtherapeutic at 0.29 s/p slight rate increase to 950 units/hr  Goal of Therapy:  Heparin level 0.3-0.7 units/ml Monitor platelets by anticoagulation protocol: Yes   Plan:  Increase heparin gtt to 1050 units/hr F/u 8 hour heparin level  Bertis Ruddy, PharmD Clinical Pharmacist Please check AMION for all [place] numbers 11/19/2019 9:06 AM

## 2019-11-19 NOTE — Progress Notes (Signed)
[facility] Progress Note    Assessment/ Plan:   1.  AKI on CKD 3b/IV: getting better with diuresis actually. Stable this AM.  S/p aortogram today, appears like he's getting more vol overloaded- will stop IVFs.  Has torsemide ordered already.  Follow for CIN- he understands these risks.  I called wife patricia but didn't get answer today  2.  PAD: s/p aortogram, multiple occlusions, will need some bypass most likely  3. A Fib: on Coumadin, hep gtt here with procedures  4.  Dispo: pending  Subjective:    S/p aortogram this AM, had CO2 subtraction imaging combined with contrast.  Getting post-cath fluids.  Increased wet cough and intermittent wheezing   Objective:   BP 110/77 (BP Location: Left Arm)   Pulse 90   Temp 97.7 F (36.5 C) (Oral)   Resp 18   Ht 6\' 6"  (1.981 m)   Wt 89.2 kg Comment: scale a  SpO2 98%   BMI 22.73 kg/m   Intake/Output Summary (Last 24 hours) at 11/19/2019 1605 Last data filed at 11/19/2019 1300 Gross per 24 hour  Intake 874.98 ml  Output 800 ml  Net 74.98 ml   Weight change: 0.771 kg  Physical Exam: GEN sitting up in bed, NAD HEENT EOMI PERRL NECK some increased JVD to earlobe PULM bilateral scattered wheezing more prominent CV irreg ABD soft nontender EXT cool legs with poor pulses and edema NEURO AAO x 3 SKIN prior area of skin graft  Imaging: PERIPHERAL VASCULAR CATHETERIZATION  Result Date: 11/19/2019 Patient name: Logan White MRN: [id number] DOB: 1941/03/31 Sex: male 11/19/2019 Pre-operative Diagnosis: Right leg rest pain Post-operative diagnosis:  Same Surgeon:  Annamarie Major Procedure Performed:  1.  Ultrasound-guided access, left femoral artery  2.  Abdominal aortogram with CO2  3.  Bilateral femoral runoff  4.  Conscious sedation 24 minutes  5.  Closure device, Mynx Indications: The patient was admitted with rest pain in the right leg.  He comes in today for angiography Procedure:  The patient was identified in  the holding area and taken to room 8.  The patient was then placed supine on the table and prepped and draped in the usual sterile fashion.  A time out was called.  Conscious sedation was administered with the use of IV fentanyl and Versed under continuous physician and nurse monitoring.  Heart rate, blood pressure, and oxygen saturation were continuously monitored.  Total sedation time was 24 minutes.  Ultrasound was used to evaluate the left common femoral artery.  It was patent .  A digital ultrasound image was acquired.  A micropuncture needle was used to access the left common femoral artery under ultrasound guidance.  An 018 wire was advanced without resistance and a micropuncture sheath was placed.  The 018 wire was removed and a benson wire was placed.  The micropuncture sheath was exchanged for a 5 french sheath.  An omniflush catheter was advanced over the wire to the level of L-1.  An abdominal angiogram was obtained with CO2.  Next the catheter was pulled down to the aortic bifurcation and additional imaging with CO2 and contrast was performed. Findings:  Aortogram with bifemoral runoff: The infrarenal abdominal aorta is heavily calcified but patent without stenosis.  The right common iliac artery is occluded at its origin.  There is reconstitution of the profundofemoral artery.  The common femoral artery is occluded.  On the left there are greater than 50% lesions within the common and external iliac  artery and significant near occlusive disease within the common femoral artery.  Bilateral superficial femoral arteries are occluded.  Intervention: None Impression:  #1  Occluded right iliac system and right common femoral artery  #2 multiple areas of stenosis within the left common and external iliac artery and significant stenosis within the common femoral artery on the left.  #3  The patient will be considered for left iliac stenting with bilateral femoral endarterectomy and left to right  femoral-femoral bypass V. Annamarie Major, M.D., FACS [facility] [place] Office: [phone] Pager:  [phone]   Labs: BMET Recent Labs  Lab 11/15/19 1110 11/16/19 0256 11/17/19 0448 11/18/19 0522 11/19/19 0306  NA 136 134* 134* 138 137  K 3.9 4.3 4.0 4.2 4.5  CL 100 101 100 100 101  CO2 22 18* 20* 23 26  GLUCOSE 91 139* 128* 100* 121*  BUN 40* 49* 50* 44* 43*  CREATININE 2.56* 2.74* 2.43* 2.34* 2.32*  CALCIUM 8.6* 8.6* 8.8* 9.3 9.0  PHOS  --   --  4.3 3.7 3.4   CBC Recent Labs  Lab 11/15/19 2333 11/15/19 2333 11/16/19 0256 11/17/19 0448 11/18/19 0522 11/19/19 0306  WBC 8.4   < > 8.2 8.9 9.2 8.7  NEUTROABS 6.0  --   --   --   --   --   HGB 9.1*   < > 9.0* 9.4* 9.6* 9.1*  HCT 27.5*   < > 27.0* 28.0* 29.2* 27.6*  MCV 76.6*   < > 76.9* 76.3* 77.5* 77.7*  PLT 261   < > 254 280 263 255   < > = values in this interval not displayed.    Medications:    . aspirin EC  81 mg Oral Daily  . atorvastatin  80 mg Oral Daily  . feeding supplement (ENSURE ENLIVE)  237 mL Oral TID BM  . metoprolol succinate  50 mg Oral Daily  . mometasone-formoterol  2 puff Inhalation BID  . multivitamin with minerals  1 tablet Oral Daily  . pantoprazole  40 mg Oral BID  . potassium chloride SA  20 mEq Oral BID  . sodium chloride flush  3 mL Intravenous Q12H  . sodium chloride flush  3 mL Intravenous Q12H  . torsemide  100 mg Oral BID      Madelon Lips, MD 11/19/2019, 4:05 PM

## 2019-11-19 NOTE — Progress Notes (Signed)
[place] for heparin Indication: AFib/PVD  No Known Allergies  Patient Measurements: Height: 6\' 6"  (198.1 cm) Weight: 196 lb 11.2 oz (89.2 kg)(scale a) IBW/kg (Calculated) : 91.4 Heparin Dosing Weight: 88kg  Vital Signs: Temp: 97.9 F (36.6 C) (03/16 1928) Temp Source: Oral (03/16 1928) BP: 145/77 (03/16 1928) Pulse Rate: 95 (03/16 1928)  Labs: Recent Labs    11/17/19 0448 11/17/19 1555 11/18/19 0522 11/19/19 0306 11/19/19 1713  HGB 9.4*  --  9.6* 9.1*  --   HCT 28.0*  --  29.2* 27.6*  --   PLT 280  --  263 255  --   HEPARINUNFRC 0.75*   < > 0.30 0.29* 0.19*  CREATININE 2.43*  --  2.34* 2.32*  --    < > = values in this interval not displayed.    Estimated Creatinine Clearance: 34.2 mL/min (A) (by C-G formula based on SCr of 2.32 mg/dL (H)).  Assessment: 79 y.o. male with h/o Afib. Eliquis on hold since discharge of previous admission on 3/5, for heparin.  Heparin level subtherapeutic at 0.19 s/p rate increase to 1050 units/hr  Goal of Therapy:  Heparin level 0.3-0.7 units/ml Monitor platelets by anticoagulation protocol: Yes   Plan:  Increase heparin gtt to 1200 units/hr F/u 8 hour heparin level  Alanda Slim, PharmD, [facility] Clinical Pharmacist Please see AMION for all Pharmacists' Contact Phone Numbers 11/19/2019, 7:45 PM

## 2019-11-19 NOTE — Progress Notes (Signed)
At approximately 08:45, patient returned to unit.

## 2019-11-19 NOTE — Interval H&P Note (Signed)
History and Physical Interval Note:  11/19/2019 7:40 AM  Logan White  has presented today for surgery, with the diagnosis of pvd.  The various methods of treatment have been discussed with the patient and family. After consideration of risks, benefits and other options for treatment, the patient has consented to  Procedure(s): ABDOMINAL AORTOGRAM W/LOWER EXTREMITY (N/A) as a surgical intervention.  The patient's history has been reviewed, patient examined, no change in status, stable for surgery.  I have reviewed the patient's chart and labs.  Questions were answered to the patient's satisfaction.     Annamarie Major

## 2019-11-19 NOTE — Progress Notes (Signed)
PT Cancellation Note  Patient Details Name: Logan White MRN: [id number] DOB: [birth year]   Cancelled Treatment:    Reason Eval/Treat Not Completed: Medical issues which prohibited therapy Pt back on unit at 08:45 and per RN on bed rest for 8 hours so PT held today. PT will continue to follow acutely.  Earney Navy, PTA Acute Rehabilitation Services Pager: [phone] Office: [phone]   11/19/2019, 2:13 PM

## 2019-11-19 NOTE — Progress Notes (Signed)
Progress Note  Patient Name: Logan White Date of Encounter: 11/19/2019  Primary Cardiologist: Kirk Ruths, MD   Subjective   Just back from angiography - occluded R common iliac, reconstitution at profunda, bilateral SFA occlusion. Emotionally seems a little more balanced. Lying almost fully horizontal in bed, little tachypneic, but oxygenating well.  Still a little sedated from the procedure.  Weight is up about half a kilogram since yesterday.  Still probably 10-12 pounds above his "dry weight". Creat stable at 2.3 pre-angio.  Inpatient Medications    Scheduled Meds: . aspirin EC  81 mg Oral Daily  . atorvastatin  80 mg Oral Daily  . feeding supplement (ENSURE ENLIVE)  237 mL Oral TID BM  . metoprolol succinate  50 mg Oral Daily  . mometasone-formoterol  2 puff Inhalation BID  . multivitamin with minerals  1 tablet Oral Daily  . pantoprazole  40 mg Oral BID  . potassium chloride SA  20 mEq Oral BID  . sodium chloride flush  3 mL Intravenous Q12H  . sodium chloride flush  3 mL Intravenous Q12H   Continuous Infusions: . sodium chloride    . sodium chloride    . sodium chloride    . heparin 950 Units/hr (11/19/19 0000)   PRN Meds: sodium chloride, sodium chloride, acetaminophen, acetaminophen, albuterol, fentaNYL (SUBLIMAZE) injection, hydrALAZINE, labetalol, ondansetron (ZOFRAN) IV, ondansetron (ZOFRAN) IV, sodium chloride flush, sodium chloride flush   Vital Signs    Vitals:   11/19/19 0809 11/19/19 0814 11/19/19 0819 11/19/19 0824  BP: 127/80 115/76 127/73 123/71  Pulse: 84 80 95 96  Resp: (!) 30 18 20  (!) 24  Temp:      TempSrc:      SpO2: 97% 97% 97% 97%  Weight:      Height:        Intake/Output Summary (Last 24 hours) at 11/19/2019 0844 Last data filed at 11/19/2019 0500 Gross per 24 hour  Intake 794.44 ml  Output 1500 ml  Net -705.56 ml   Last 3 Weights 11/19/2019 11/18/2019 11/17/2019  Weight (lbs) 196 lb 11.2 oz 195 lb 194 lb 12.8 oz  Weight  (kg) 89.223 kg 88.451 kg 88.361 kg  Some encounter information is confidential and restricted. Go to Review Flowsheets activity to see all data.      Telemetry    Atrial fibrillation with controlled ventricular response - Personally Reviewed  ECG    No new tracing- Personally Reviewed  Physical Exam  Lying fully supine, mildly tachypneic but not in distress. GEN: No acute distress.   Neck:  7-8 cm JVD Cardiac:  Irregular, 1/6 holosystolic murmur at the left lower sternal border and at the apex, no diastolic murmurs, rubs, or gallops.  Respiratory: Clear to auscultation bilaterally. GI: Soft, nontender, non-distended  MS:  Bilateral dependent edema; No deformity. Neuro:   Still slightly sedated from procedure.  He aroused and fully oriented. Psych: Normal affect   Labs    High Sensitivity Troponin:   Recent Labs  Lab 11/15/19 1318 11/15/19 1430  TROPONINIHS 7 8      Chemistry Recent Labs  Lab 11/17/19 0448 11/18/19 0522 11/19/19 0306  NA 134* 138 137  K 4.0 4.2 4.5  CL 100 100 101  CO2 20* 23 26  GLUCOSE 128* 100* 121*  BUN 50* 44* 43*  CREATININE 2.43* 2.34* 2.32*  CALCIUM 8.8* 9.3 9.0  ALBUMIN 3.0* 3.2* 2.9*  GFRNONAA 25* 26* 26*  GFRAA 29* 30* 30*  ANIONGAP 14  15 10     Hematology Recent Labs  Lab 11/17/19 0448 11/18/19 0522 11/19/19 0306  WBC 8.9 9.2 8.7  RBC 3.67* 3.77* 3.55*  HGB 9.4* 9.6* 9.1*  HCT 28.0* 29.2* 27.6*  MCV 76.3* 77.5* 77.7*  MCH 25.6* 25.5* 25.6*  MCHC 33.6 32.9 33.0  RDW 20.8* 21.1* 20.9*  PLT 280 263 255    BNP Recent Labs  Lab 11/15/19 1110  BNP 2,374.7*     DDimer No results for input(s): DDIMER in the last 168 hours.   Radiology    PERIPHERAL VASCULAR CATHETERIZATION  Result Date: 11/19/2019 Patient name: VONN SLIGER MRN: [id number] DOB: [birth year] Sex: male 11/19/2019 Pre-operative Diagnosis: Right leg rest pain Post-operative diagnosis:  Same Surgeon:  Annamarie Major Procedure Performed:  1.   Ultrasound-guided access, left femoral artery  2.  Abdominal aortogram with CO2  3.  Bilateral femoral runoff  4.  Conscious sedation 24 minutes  5.  Closure device, Mynx Indications: The patient was admitted with rest pain in the right leg.  He comes in today for angiography Procedure:  The patient was identified in the holding area and taken to room 8.  The patient was then placed supine on the table and prepped and draped in the usual sterile fashion.  A time out was called.  Conscious sedation was administered with the use of IV fentanyl and Versed under continuous physician and nurse monitoring.  Heart rate, blood pressure, and oxygen saturation were continuously monitored.  Total sedation time was 24 minutes.  Ultrasound was used to evaluate the left common femoral artery.  It was patent .  A digital ultrasound image was acquired.  A micropuncture needle was used to access the left common femoral artery under ultrasound guidance.  An 018 wire was advanced without resistance and a micropuncture sheath was placed.  The 018 wire was removed and a benson wire was placed.  The micropuncture sheath was exchanged for a 5 french sheath.  An omniflush catheter was advanced over the wire to the level of L-1.  An abdominal angiogram was obtained with CO2.  Next the catheter was pulled down to the aortic bifurcation and additional imaging with CO2 and contrast was performed. Findings:  Aortogram with bifemoral runoff: The infrarenal abdominal aorta is heavily calcified but patent without stenosis.  The right common iliac artery is occluded at its origin.  There is reconstitution of the profundofemoral artery.  The common femoral artery is occluded.  On the left there are greater than 50% lesions within the common and external iliac artery and significant near occlusive disease within the common femoral artery.  Bilateral superficial femoral arteries are occluded.  Intervention: None Impression:  #1  Occluded right iliac  system and right common femoral artery  #2 multiple areas of stenosis within the left common and external iliac artery and significant stenosis within the common femoral artery on the left.  #3  The patient will be considered for left iliac stenting with bilateral femoral endarterectomy and left to right femoral-femoral bypass V. Annamarie Major, M.D., [facility] [place] Office: [phone] Pager:  [phone]   Cardiac Studies   Abd Aogram w runoff 11/19/2019             #1  Occluded right iliac system and right common femoral artery             #2 multiple areas of stenosis within the left common and external iliac artery and significant stenosis within  the common femoral artery on the left.             #3  The patient will be considered for left iliac stenting with bilateral femoral endarterectomy and left to right femoral-femoral bypass  Relevant CV Studies: RHC:10/02/2019 Conclusion  Moderate pulmonary hypertension with mean PA pressure of 42 mmHg.  Mean pulmonary capillary wedge pressure 28 mmHg.  Phasic pulmonary artery pressure 63/29 mmHg, mean RA pressure 20 mmHg, and pulmonary artery pulsatility index is 1.7 (suggesting RV dysfunction)  Mixed venous O2 saturation 57%   ECHO: 09/29/19 1. Left ventricular ejection fraction, by visual estimation, is 25 to  30%. The left ventricle has severely decreased function. There is mildly  increased left ventricular hypertrophy.  2. Left ventricular diastolic function could not be evaluated.  3. Severely dilated left ventricular internal cavity size.  4. The left ventricle demonstrates global hypokinesis.  5. Global right ventricle has severely reduced systolic function.The  right ventricular size is mildly enlarged.  6. Left atrial size was severely dilated.  7. The interatrial septum is aneurysmal.  8. Right atrial size was severely dilated.  9. The mitral valve is normal in structure. Moderate  mitral valve  regurgitation. No evidence of mitral stenosis.  10. The tricuspid valve is normal in structure. Tricuspid valve  regurgitation is mild.  11. The aortic valve is tricuspid. Aortic valve regurgitation is mild.  Mild to moderate aortic valve sclerosis/calcification without any evidence  of aortic stenosis.  12. The pulmonic valve was normal in structure. Pulmonic valve  regurgitation is mild.  13. Moderately elevated pulmonary artery systolic pressure.  14. The inferior vena cava is dilated in size with <50% respiratory  variability, suggesting right atrial pressure of 15 mmHg.  15. Severe global reduction in LV systolic function; mild LVH; 4 chamber  enlargement; severe RV dysfunction; mild AI; moderate MR; mild TR;  moderate pulmonary hypertension.    Patient Profile     79 y.o. male with a hx of permanent Afib, CAD, Vfib arrest, ICM and CHF, PAD with advanced ischemic symptoms of the right foot, chronic kidney disease stage IV   Assessment & Plan    1. CHF: SOB/DOE/acute on chronic systolic congestive heart failure- -  start torsemide 100 mg p.o. twice daily.  Appreciate Dr. Bishop Dublin advice. -  No RAAS inhibitors. On beta blocker. 2.Acute on CKD3b: Chronic kidney disease, baseline creatinine appears to be 2.0-2.3 based on values from December-January.  Creatinine has been stable at 2.3 for the last couple of days, need to evaluate daily following contrast exposure and diuretic therapy. 3. PAD: Severe bilateral iliac and femoral disease.  Chronic total occlusion of the right common iliac and bilateral superficial femoral arteries.  On the right there is reconstitution at the level of the profunda femoris.  No simple percutaneous solution, will require right iliac stenting, bilateral femoral endarterectomy, femorofemoral bypass.  He is at severely increased risk of cardiovascular complications with vascular surgery.  He will benefit from a hemodynamic "tune up" before the  surgical component of his revascularization. 4.Fe def anemia: no overt bleeding. May also have a component of EPO deficiency. Hgb substantially lower from January, but stable during this admission. 5. AFib: Rate control is adequate on beta-blockers.  Questionable candidate for long term anticoagulation due to iron deficiency. IV heparin for now.  6. Mitral regurgitation-moderate on recent echo     For questions or updates, please contact [place] Please consult [email or web address] for contact info under  Signed, Sanda Klein, MD  11/19/2019, 8:44 AM

## 2019-11-19 NOTE — Progress Notes (Signed)
Notified by CCMD that pt had 10-beat run of VTach. Pt asymptomatic and stable, no pain. Provider notified. Will monitor.

## 2019-11-19 NOTE — Progress Notes (Signed)
Patient off of unit for abdominal aortogram.

## 2019-11-19 NOTE — Progress Notes (Signed)
OT Cancellation Note  Patient Details Name: Logan White MRN: [id number] DOB: 1941/08/31   Cancelled Treatment:    Reason Eval/Treat Not Completed: Active bedrest order. Pt underwent vascular procedure this morning. Per RN Kirke Shaggy, pt is on bedrest for 8 hours. OT to hold this date and follow up tomorrow as time allows.   Mauri Brooklyn OTR/L 11/19/2019, 2:13 PM

## 2019-11-19 NOTE — Op Note (Signed)
    Patient name: Logan White MRN: [id number] DOB: 06-04-1941 Sex: male  11/19/2019 Pre-operative Diagnosis: Right leg rest pain Post-operative diagnosis:  Same Surgeon:  Annamarie Major Procedure Performed:  1.  Ultrasound-guided access, left femoral artery  2.  Abdominal aortogram with CO2  3.  Bilateral femoral runoff  4.  Conscious sedation 24 minutes  5.  Closure device, Mynx  Indications: The patient was admitted with rest pain in the right leg.  He comes in today for angiography  Procedure:  The patient was identified in the holding area and taken to room 8.  The patient was then placed supine on the table and prepped and draped in the usual sterile fashion.  A time out was called.  Conscious sedation was administered with the use of IV fentanyl and Versed under continuous physician and nurse monitoring.  Heart rate, blood pressure, and oxygen saturation were continuously monitored.  Total sedation time was 24 minutes.  Ultrasound was used to evaluate the left common femoral artery.  It was patent .  A digital ultrasound image was acquired.  A micropuncture needle was used to access the left common femoral artery under ultrasound guidance.  An 018 wire was advanced without resistance and a micropuncture sheath was placed.  The 018 wire was removed and a benson wire was placed.  The micropuncture sheath was exchanged for a 5 french sheath.  An omniflush catheter was advanced over the wire to the level of L-1.  An abdominal angiogram was obtained with CO2.  Next the catheter was pulled down to the aortic bifurcation and additional imaging with CO2 and contrast was performed. Findings:   Aortogram with bifemoral runoff: The infrarenal abdominal aorta is heavily calcified but patent without stenosis.  The right common iliac artery is occluded at its origin.  There is reconstitution of the profundofemoral artery.  The common femoral artery is occluded.  On the left there are greater than 50%  lesions within the common and external iliac artery and significant near occlusive disease within the common femoral artery.  Bilateral superficial femoral arteries are occluded.   Intervention: None  Impression:  #1  Occluded right iliac system and right common femoral artery  #2 multiple areas of stenosis within the left common and external iliac artery and significant stenosis within the common femoral artery on the left.  #3  The patient will be considered for left iliac stenting with bilateral femoral endarterectomy and left to right femoral-femoral bypass    V. Annamarie Major, M.D., [facility] [place] Office: [phone] Pager:  [phone]

## 2019-11-19 NOTE — Progress Notes (Signed)
PROGRESS NOTE  KOSISOCHUKWU BURNINGHAM DTO:[id number] DOB: 10-21-1940   PCP: Patient, No Pcp Per  Patient is from: Home.  Lives with his wife.  Uses walker and cane at home.  DOA: 11/15/2019 LOS: 4  Brief Narrative / Interim history: 79 year old male with past medical history significant for A. fib on Eliquis, COPD/chronic RF on 2 L by [place], PVD, pulmonary hypertension, AAA followed by Dr. Gwenlyn Found, CVA, DM-2, CAD s/p stent, CKD-3b/4, biventricular heart failure with EF of 25 to 30%.  Patient presented with BLE pain and numbness, Rt > Lt, and shortness of breath.  Patient is currently being managed for limb threatening ischemia/PAD and acute on chronic systolic CHF and AKI on XIP-3A/2.  CXR with pulmonary edema on presentation.  Evaluated by vascular surgery who is planning angiogram once renal function is optimized.  Vascular surgery plans to proceed with angiography tomorrow, 11/19/2019.  Will consult nephrology team (discussed with Dr. Hollie Salk) as patient is at big risk risk of worsening renal function.  Hopefully, nephrology will address possible hydration prior to procedure, if patient will tolerate versus just holding diuretics.  Either way, the risk for contrast-induced worsening renal function persist.    11/18/2019: Patient seen.  No new changes.  Input from cardiology and vascular surgery team appreciated.  For possible angiography/angioplasty tomorrow by vascular surgery team.  Currently on heparin drip.  Serum creatinine is 2.34.  Patient is at risk for contrast-induced nephropathy.  I have consulted nephrology team.  We will hold further diuretics.  Nephrology to advise need for hydration prior to procedure i.e. if patient cardiac status tolerates.  This is a difficult situation.      11/19/2019: Patient underwent aortogram earlier today.  Aortogram revealed occluded right iliac system and right common femoral artery, multiple areas of stenosis within the left common and external iliac artery and  significant stenosis within the common femoral artery on the left.  According to the vascular surgery team, patient be considered for left iliac stenting with bilateral femoral endarterectomy and left to right femoral-femoral bypass.  Patient is currently on cautious fluid due to risk for contrast-induced nephropathy.  Nephrology input is appreciated.  No new complaints today.  Subjective: No new complaints  Objective: Vitals:   11/19/19 1030 11/19/19 1101 11/19/19 1137 11/19/19 1630  BP: 139/79 131/69 110/77 (!) 123/98  Pulse: 93 98 90 89  Resp:   18 17  Temp:   97.7 F (36.5 C) 97.8 F (36.6 C)  TempSrc:   Oral Oral  SpO2:   98% 100%  Weight:      Height:        Intake/Output Summary (Last 24 hours) at 11/19/2019 1636 Last data filed at 11/19/2019 1400 Gross per 24 hour  Intake 874.98 ml  Output 1200 ml  Net -325.02 ml   Filed Weights   11/17/19 0521 11/18/19 0500 11/19/19 0436  Weight: 88.4 kg 88.5 kg 89.2 kg    Examination:  GENERAL: No acute distress.    HEENT: Mild pallor.  No jaundice. NECK: Supple.  No apparent JVD.  RESP: Decreased air entry.  Query wheezing from upper airways. CVS: N0-N3, ejection systolic murmur, irregular. ABD/GI/GU: Bowel sounds present. Soft. Non tender.  EXT: Mild leg edema. NEURO: Awake, alert and oriented fairly.  Patient moves all extremities..    Procedures:  None  Assessment & Plan: PVD/limb threatening ischemia:  -Followed by Dr. Gwenlyn Found -Per DDS, plan for angiogram and revascularization, possibly, tomorrow. -Hold Eliquis.  Continue IV heparin, statin and  aspirin.  -Check lipid panel -Nephrology team consulted  11/19/2019: Patient is status post aortogram and runoff.  Prophylaxis against CIN is in progress.  Aortogram findings as documented above.  Vascular surgery team is managing.  Acute on chronic systolic CHF/RV failure:  -Echo on 09/29/2019 with EF of 25 to 94%, unknown diastolic function, global hypokinesis, severely  reduced RVSF, severe LAE/RAE, interatrial septum aneurysm, and moderate pulm HTN.  RHC in 1/21 with pulmonary hypertension and RV dysfunction.  CXR concerning for pulmonary edema.  BNP 2400 (1600 two weeks ago).  No crackles on lung exam.  Only 300 cc urine output charted.  Creatinine trended up.  Soft blood pressures. -Hold diuretics pre angiography -Cardiology input is appreciated.      AKI on CKD-3B/4 -Serum creatinine today is 2.34  -Nephrology consulted  -Patient is at risk for contrast-induced nephropathy.   11/19/2019: Post procedure hydration.  Persistent Afib:  -Was on high-dose metoprolol and Eliquis -Currently on (preprocedure) -Continue to optimize heart rate  DM-2 with CKD-3b:  -Monitor and optimize.    Hypertension:  -Aim for MAP greater than 65 mmHg.   -Avoid excessive drop in blood pressure.   -Goal blood pressure is less than 130/80 mmHg.   Chronic respiratory failure:  -On 2 L at baseline. -Chronic COPD -Wean oxygen as stable -Continue Dulera and as needed albuterol  HLD -Lipid panel -Continue Lipitor  CAD s/p DES to LAD in 2004, PCI in 2014.  No chest pain. -Continue BB, statin and aspirin. -Stable.  No chest pain.  AAA:  -3.0, stable, and unchanged on [place] on 03/15/19 -Continue statin and aspirin  Debility/physical deconditioning: Uses walker at baseline. -PT/OT  Goal of care: -Patient remains full code.  -Palliative care input is appreciated.   Nutrition: -Consult dietitian    DVT prophylaxis: On heparin drip Code Status: Full code Family Communication:    Discharge barrier: Evaluation and treatment of limb threatening ischemia, CHF exacerbation and AKI Patient is from: Home Final disposition: To be determined  Consultants: VVS, cardiology, palliative care   Microbiology summarized: COVID-19 screen negative.  Sch Meds:  Scheduled Meds: . aspirin EC  81 mg Oral Daily  . atorvastatin  80 mg Oral Daily  . feeding supplement  (ENSURE ENLIVE)  237 mL Oral TID BM  . metoprolol succinate  50 mg Oral Daily  . mometasone-formoterol  2 puff Inhalation BID  . multivitamin with minerals  1 tablet Oral Daily  . pantoprazole  40 mg Oral BID  . potassium chloride SA  20 mEq Oral BID  . sodium chloride flush  3 mL Intravenous Q12H  . sodium chloride flush  3 mL Intravenous Q12H  . torsemide  100 mg Oral BID   Continuous Infusions: . sodium chloride    . sodium chloride    . heparin 1,050 Units/hr (11/19/19 0915)   PRN Meds:.sodium chloride, sodium chloride, acetaminophen, acetaminophen, albuterol, fentaNYL (SUBLIMAZE) injection, hydrALAZINE, labetalol, ondansetron (ZOFRAN) IV, ondansetron (ZOFRAN) IV, sodium chloride flush, sodium chloride flush  Antimicrobials: Anti-infectives (From admission, onward)   None       I have personally reviewed the following labs and images: CBC: Recent Labs  Lab 11/15/19 2333 11/16/19 0256 11/17/19 0448 11/18/19 0522 11/19/19 0306  WBC 8.4 8.2 8.9 9.2 8.7  NEUTROABS 6.0  --   --   --   --   HGB 9.1* 9.0* 9.4* 9.6* 9.1*  HCT 27.5* 27.0* 28.0* 29.2* 27.6*  MCV 76.6* 76.9* 76.3* 77.5* 77.7*  PLT 261 254  280 263 255   BMP &GFR Recent Labs  Lab 11/15/19 1110 11/15/19 1318 11/16/19 0256 11/17/19 0448 11/18/19 0522 11/19/19 0306  NA 136  --  134* 134* 138 137  K 3.9  --  4.3 4.0 4.2 4.5  CL 100  --  101 100 100 101  CO2 22  --  18* 20* 23 26  GLUCOSE 91  --  139* 128* 100* 121*  BUN 40*  --  49* 50* 44* 43*  CREATININE 2.56*  --  2.74* 2.43* 2.34* 2.32*  CALCIUM 8.6*  --  8.6* 8.8* 9.3 9.0  MG  --  2.1  --  2.1 2.1 2.1  PHOS  --   --   --  4.3 3.7 3.4   Estimated Creatinine Clearance: 34.2 mL/min (A) (by C-G formula based on SCr of 2.32 mg/dL (H)). Liver & Pancreas: Recent Labs  Lab 11/17/19 0448 11/18/19 0522 11/19/19 0306  ALBUMIN 3.0* 3.2* 2.9*   No results for input(s): LIPASE, AMYLASE in the last 168 hours. No results for input(s): AMMONIA in the last  168 hours. Diabetic: Recent Labs    11/17/19 0448  HGBA1C 5.7*   No results for input(s): GLUCAP in the last 168 hours. Cardiac Enzymes: No results for input(s): CKTOTAL, CKMB, CKMBINDEX, TROPONINI in the last 168 hours. Recent Labs    08/15/19 0949  PROBNP 16,511*   Coagulation Profile: No results for input(s): INR, PROTIME in the last 168 hours. Thyroid Function Tests: No results for input(s): TSH, T4TOTAL, FREET4, T3FREE, THYROIDAB in the last 72 hours. Lipid Profile: Recent Labs    11/17/19 0448  CHOL 66  HDL 27*  LDLCALC 34  TRIG 27  CHOLHDL 2.4   Anemia Panel: No results for input(s): VITAMINB12, FOLATE, FERRITIN, TIBC, IRON, RETICCTPCT in the last 72 hours. Urine analysis:    Component Value Date/Time   COLORURINE YELLOW 11/04/2019 1500   APPEARANCEUR CLEAR 11/04/2019 1500   LABSPEC 1.011 11/04/2019 1500   PHURINE 6.0 11/04/2019 1500   GLUCOSEU NEGATIVE 11/04/2019 1500   HGBUR NEGATIVE 11/04/2019 1500   BILIRUBINUR NEGATIVE 11/04/2019 1500   KETONESUR NEGATIVE 11/04/2019 1500   PROTEINUR NEGATIVE 11/04/2019 1500   UROBILINOGEN 0.2 03/23/2013 1740   NITRITE NEGATIVE 11/04/2019 1500   LEUKOCYTESUR NEGATIVE 11/04/2019 1500   Sepsis Labs: Invalid input(s): PROCALCITONIN, [place]  Microbiology: Recent Results (from the past 240 hour(s))  SARS CORONAVIRUS 2 (TAT 6-24 HRS) Nasopharyngeal Nasopharyngeal Swab     Status: None   Collection Time: 11/15/19  2:18 PM   Specimen: Nasopharyngeal Swab  Result Value Ref Range Status   SARS Coronavirus 2 NEGATIVE NEGATIVE Final    Comment: (NOTE) SARS-CoV-2 target nucleic acids are NOT DETECTED. The SARS-CoV-2 RNA is generally detectable in upper and lower respiratory specimens during the acute phase of infection. Negative results do not preclude SARS-CoV-2 infection, do not rule out co-infections with other pathogens, and should not be used as the sole basis for treatment or other patient management  decisions. Negative results must be combined with clinical observations, patient history, and epidemiological information. The expected result is Negative. Fact Sheet for Patients: SugarRoll.be Fact Sheet for Healthcare Providers: https://www.woods-mathews.com/ This test is not yet approved or cleared by the [place] FDA and  has been authorized for detection and/or diagnosis of SARS-CoV-2 by FDA under an Emergency Use Authorization (EUA). This EUA will remain  in effect (meaning this test can be used) for the duration of the COVID-19 declaration under Section 56 4(b)(1) of  the Act, 21 U.S.C. section 360bbb-3(b)(1), unless the authorization is terminated or revoked sooner. Performed at [facility], [place] [address] [place], [place] [address]     Radiology Studies: PERIPHERAL VASCULAR CATHETERIZATION  Result Date: 11/19/2019 Patient name: Logan White MRN: [id number] DOB: [birth year] Sex: male 11/19/2019 Pre-operative Diagnosis: Right leg rest pain Post-operative diagnosis:  Same Surgeon:  Annamarie Major Procedure Performed:  1.  Ultrasound-guided access, left femoral artery  2.  Abdominal aortogram with CO2  3.  Bilateral femoral runoff  4.  Conscious sedation 24 minutes  5.  Closure device, Mynx Indications: The patient was admitted with rest pain in the right leg.  He comes in today for angiography Procedure:  The patient was identified in the holding area and taken to room 8.  The patient was then placed supine on the table and prepped and draped in the usual sterile fashion.  A time out was called.  Conscious sedation was administered with the use of IV fentanyl and Versed under continuous physician and nurse monitoring.  Heart rate, blood pressure, and oxygen saturation were continuously monitored.  Total sedation time was 24 minutes.  Ultrasound was used to evaluate the left common femoral artery.  It was patent .  A digital ultrasound  image was acquired.  A micropuncture needle was used to access the left common femoral artery under ultrasound guidance.  An 018 wire was advanced without resistance and a micropuncture sheath was placed.  The 018 wire was removed and a benson wire was placed.  The micropuncture sheath was exchanged for a 5 french sheath.  An omniflush catheter was advanced over the wire to the level of L-1.  An abdominal angiogram was obtained with CO2.  Next the catheter was pulled down to the aortic bifurcation and additional imaging with CO2 and contrast was performed. Findings:  Aortogram with bifemoral runoff: The infrarenal abdominal aorta is heavily calcified but patent without stenosis.  The right common iliac artery is occluded at its origin.  There is reconstitution of the profundofemoral artery.  The common femoral artery is occluded.  On the left there are greater than 50% lesions within the common and external iliac artery and significant near occlusive disease within the common femoral artery.  Bilateral superficial femoral arteries are occluded.  Intervention: None Impression:  #1  Occluded right iliac system and right common femoral artery  #2 multiple areas of stenosis within the left common and external iliac artery and significant stenosis within the common femoral artery on the left.  #3  The patient will be considered for left iliac stenting with bilateral femoral endarterectomy and left to right femoral-femoral bypass V. Annamarie Major, M.D., [facility] [place] Office: [phone] Pager:  [phone]   Time spent: 96 minutes Dana Allan, MD [facility]  If 7PM-7AM, please contact night-coverage [email or web address] Password [facility] 11/19/2019, 4:36 PM

## 2019-11-20 DIAGNOSIS — I70221 Atherosclerosis of native arteries of extremities with rest pain, right leg: Secondary | ICD-10-CM

## 2019-11-20 LAB — RENAL FUNCTION PANEL
Albumin: 2.9 g/dL — ABNORMAL LOW (ref 3.5–5.0)
Anion gap: 13 (ref 5–15)
BUN: 42 mg/dL — ABNORMAL HIGH (ref 8–23)
CO2: 24 mmol/L (ref 22–32)
Calcium: 8.9 mg/dL (ref 8.9–10.3)
Chloride: 100 mmol/L (ref 98–111)
Creatinine, Ser: 2.05 mg/dL — ABNORMAL HIGH (ref 0.61–1.24)
GFR calc Af Amer: 35 mL/min — ABNORMAL LOW (ref >60)
GFR calc non Af Amer: 31 mL/min — ABNORMAL LOW (ref >60)
Glucose, Bld: 139 mg/dL — ABNORMAL HIGH (ref 70–99)
Phosphorus: 3 mg/dL (ref 2.5–4.6)
Potassium: 4.4 mmol/L (ref 3.5–5.1)
Sodium: 137 mmol/L (ref 135–145)

## 2019-11-20 LAB — HEPARIN LEVEL (UNFRACTIONATED)
Heparin Unfractionated: 0.12 IU/mL — ABNORMAL LOW (ref 0.30–0.70)
Heparin Unfractionated: 0.69 IU/mL (ref 0.30–0.70)

## 2019-11-20 LAB — MAGNESIUM: Magnesium: 2 mg/dL (ref 1.7–2.4)

## 2019-11-20 MED ORDER — FUROSEMIDE 10 MG/ML IJ SOLN
80.0000 mg | Freq: Once | INTRAMUSCULAR | Status: AC
  Administered 2019-11-20: 80 mg via INTRAVENOUS
  Filled 2019-11-20: qty 8

## 2019-11-20 MED ORDER — HEPARIN BOLUS VIA INFUSION
2000.0000 [IU] | Freq: Once | INTRAVENOUS | Status: AC
  Administered 2019-11-20: 2000 [IU] via INTRAVENOUS
  Filled 2019-11-20: qty 2000

## 2019-11-20 MED ORDER — TRAZODONE HCL 50 MG PO TABS
25.0000 mg | ORAL_TABLET | Freq: Once | ORAL | Status: AC
  Administered 2019-11-20: 25 mg via ORAL
  Filled 2019-11-20: qty 1

## 2019-11-20 MED ORDER — FUROSEMIDE 10 MG/ML IJ SOLN
80.0000 mg | Freq: Two times a day (BID) | INTRAMUSCULAR | Status: DC
  Administered 2019-11-20 – 2019-11-21 (×2): 80 mg via INTRAVENOUS
  Filled 2019-11-20 (×2): qty 8

## 2019-11-20 NOTE — Progress Notes (Signed)
Occupational Therapy Treatment Patient Details Name: Logan White MRN: [id number] DOB: [birth year] Today's Date: 11/20/2019    History of present illness 79 y.o. male with medical history significant of afib on Coumadin; PVD; HLD; h/o CVA; DM; CAD s/p stent; stage 4 CKD; and AAA presenting with B leg pain.  He reports that he is here due to his R > L legs - pain, they go numb on him. Pt with recent admission 3/1-3/5 for RLE weakness, CHF exacerbation, and anemia. Pt scheduled for aortogram monday with pending intervention following per vascular surgery.   OT comments  Pt required mod encouragement to participate in therapy tasks this date due to frustrations of having to stay additional night and fatigue. Educated pt on importance of daily activity with fair understanding. Pt tolerated sitting EOB ~10 min with good balance. Educated pt on safety strategies, activity pacing, and fall prevention with fair understanding and follow through. Pt declined additional self-care and mobility tasks this date stating "I already walked and I'm not walking again today." Pt declined use of RW to transfer to bedside chair requiring supervision to min guard to ensure balance and safety with lines. Continued safety education, however pt not receptive. All questions/concerns answered at this time. OT will continue to follow acutely.    Follow Up Recommendations  Supervision - Intermittent;No OT follow up    Equipment Recommendations  Tub/shower seat    Recommendations for Other Services      Precautions / Restrictions Precautions Precautions: Fall Precaution Comments: pt previously reported R LE numbness/heaviness with ambulation, denies sx today Restrictions Weight Bearing Restrictions: No LLE Weight Bearing: Weight bearing as tolerated       Mobility Bed Mobility Overal bed mobility: Modified Independent Bed Mobility: Supine to Sit     Supine to sit: Modified independent (Device/Increase  time) Sit to supine: Modified independent (Device/Increase time)   General bed mobility comments: HOB flat, use of rails  Transfers Overall transfer level: Needs assistance Equipment used: (Pt declined use of RW) Transfers: Sit to/from [facility] Sit to Stand: Supervision Stand pivot transfers: Supervision;Min guard       General transfer comment: min guard for safety and line management. Pt declined use of RW for transfer to chair.     Balance Overall balance assessment: Needs assistance Sitting-balance support: No upper extremity supported;Feet supported Sitting balance-Leahy Scale: Good     Standing balance support: Bilateral upper extremity supported;During functional activity Standing balance-Leahy Scale: Fair Standing balance comment: supervision with unilateral UE support of RW                           ADL either performed or assessed with clinical judgement   ADL                                       Functional mobility during ADLs: Supervision/safety General ADL Comments: Pt declined all ADLs and mobility this date. Pt extremely frustrated. Pt agreeable to sitting up in chair for lunch.      Vision       Perception     Praxis      Cognition Arousal/Alertness: Awake/alert Behavior During Therapy: Agitated;Flat affect Overall Cognitive Status: Within Functional Limits for tasks assessed  General Comments: Pt frustrated during session. Per pt he did not sleep well last night and was told this morning he would have to stay another day in the hospital. Pt is eager to get home.         Exercises     Shoulder Instructions       General Comments Pt on 3L [place] throughout. No signs/symptoms of distress    Pertinent Vitals/ Pain       Pain Assessment: No/denies pain Pain Intervention(s): Limited activity within patient's tolerance;Monitored during session  Home  Living                                          Prior Functioning/Environment              Frequency           Progress Toward Goals  OT Goals(current goals can now be found in the care plan section)  Progress towards OT goals: Progressing toward goals  ADL Goals Pt Will Perform Grooming: with modified independence;standing Pt Will Perform Lower Body Bathing: with modified independence;sit to/from stand Pt Will Perform Lower Body Dressing: with modified independence;sit to/from stand Pt Will Transfer to Toilet: with modified independence;ambulating Pt Will Perform Toileting - Clothing Manipulation and hygiene: with modified independence;sit to/from stand Additional ADL Goal #1: Pt will verbalize/return demonsrate at least 3 energy conservation strategies during ADL task.  Plan Discharge plan remains appropriate    Co-evaluation                 AM-PAC OT "6 Clicks" Daily Activity     Outcome Measure   Help from another person eating meals?: None Help from another person taking care of personal grooming?: None Help from another person toileting, which includes using toliet, bedpan, or urinal?: A Little Help from another person bathing (including washing, rinsing, drying)?: A Little Help from another person to put on and taking off regular upper body clothing?: None Help from another person to put on and taking off regular lower body clothing?: A Little 6 Click Score: 21    End of Session Equipment Utilized During Treatment: Oxygen  OT Visit Diagnosis: Unsteadiness on feet (R26.81);Other (comment)(decreased activity tolerance)   Activity Tolerance Patient limited by fatigue;Other (comment)(Limited by frustrations)   Patient Left in chair;with call bell/phone within reach   Nurse Communication Mobility status        Time: 6045-4098 OT Time Calculation (min): 21 min  Charges: OT General Charges $OT Visit: 1 Visit OT  Treatments $Therapeutic Activity: 8-22 mins  Mauri Brooklyn OTR/L [phone]   Mauri Brooklyn 11/20/2019, 12:55 PM

## 2019-11-20 NOTE — Progress Notes (Signed)
Progress Note  Patient Name: Logan White Date of Encounter: 11/20/2019  Primary Cardiologist: Kirk Ruths, MD   Subjective   A little wheezy today, but able to lie in bed at 30 degrees head of bed elevation.  Denies angina.  Foot still uncomfortable, keeps it dangling over the side of the bed. Fluid balance net diuresis 2 L since admission, with little diuresis yesterday.  It is reportedly actually higher than it was on admission when he presented for shortness of breath. Creatinine improved from yesterday and at baseline 2.0.  Inpatient Medications    Scheduled Meds: . aspirin EC  81 mg Oral Daily  . atorvastatin  80 mg Oral Daily  . feeding supplement (ENSURE ENLIVE)  237 mL Oral TID BM  . furosemide  80 mg Intravenous Q12H  . furosemide  80 mg Intravenous Once  . metoprolol succinate  50 mg Oral Daily  . mometasone-formoterol  2 puff Inhalation BID  . multivitamin with minerals  1 tablet Oral Daily  . pantoprazole  40 mg Oral BID  . potassium chloride SA  20 mEq Oral BID  . sodium chloride flush  3 mL Intravenous Q12H  . sodium chloride flush  3 mL Intravenous Q12H   Continuous Infusions: . sodium chloride    . sodium chloride    . heparin 1,500 Units/hr (11/20/19 0618)   PRN Meds: sodium chloride, sodium chloride, acetaminophen, acetaminophen, albuterol, fentaNYL (SUBLIMAZE) injection, hydrALAZINE, labetalol, ondansetron (ZOFRAN) IV, ondansetron (ZOFRAN) IV, sodium chloride flush, sodium chloride flush   Vital Signs    Vitals:   11/19/19 1630 11/19/19 1928 11/20/19 0512 11/20/19 0753  BP: (!) 123/98 (!) 145/77 107/63   Pulse: 89 95 81   Resp: 17 19 16    Temp: 97.8 F (36.6 C) 97.9 F (36.6 C) 98 F (36.7 C)   TempSrc: Oral Oral Oral   SpO2: 100% 100% 90% 92%  Weight:   90.2 kg   Height:        Intake/Output Summary (Last 24 hours) at 11/20/2019 0935 Last data filed at 11/20/2019 0900 Gross per 24 hour  Intake 1440 ml  Output 2100 ml  Net -660  ml   Last 3 Weights 11/20/2019 11/19/2019 11/18/2019  Weight (lbs) 198 lb 14.4 oz 196 lb 11.2 oz 195 lb  Weight (kg) 90.22 kg 89.223 kg 88.451 kg  Some encounter information is confidential and restricted. Go to Review Flowsheets activity to see all data.      Telemetry    Atrial fibrillation with controlled ventricular rate, single 10 beat episode of nonsustained VT- Personally Reviewed  ECG    No new tracing- Personally Reviewed  Physical Exam  Appears comfortable at 30 degree head of bed elevation GEN: No acute distress.   Neck:  8-9 cm JVD Cardiac:  Irregular, no murmurs, rubs, or gallops.  Respiratory: Clear to auscultation bilaterally. GI: Soft, nontender, non-distended  MS: No edema; No deformity. Neuro:  Nonfocal  Psych: Normal affect   Labs    High Sensitivity Troponin:   Recent Labs  Lab 11/15/19 1318 11/15/19 1430  TROPONINIHS 7 8      Chemistry Recent Labs  Lab 11/18/19 0522 11/19/19 0306 11/20/19 0511  NA 138 137 137  K 4.2 4.5 4.4  CL 100 101 100  CO2 23 26 24   GLUCOSE 100* 121* 139*  BUN 44* 43* 42*  CREATININE 2.34* 2.32* 2.05*  CALCIUM 9.3 9.0 8.9  ALBUMIN 3.2* 2.9* 2.9*  GFRNONAA 26* 26* 31*  GFRAA 30* 30* 35*  ANIONGAP 15 10 13      Hematology Recent Labs  Lab 11/17/19 0448 11/18/19 0522 11/19/19 0306  WBC 8.9 9.2 8.7  RBC 3.67* 3.77* 3.55*  HGB 9.4* 9.6* 9.1*  HCT 28.0* 29.2* 27.6*  MCV 76.3* 77.5* 77.7*  MCH 25.6* 25.5* 25.6*  MCHC 33.6 32.9 33.0  RDW 20.8* 21.1* 20.9*  PLT 280 263 255    BNP Recent Labs  Lab 11/15/19 1110  BNP 2,374.7*     DDimer No results for input(s): DDIMER in the last 168 hours.   Radiology    PERIPHERAL VASCULAR CATHETERIZATION  Result Date: 11/19/2019 Patient name: Logan White MRN: [id number] DOB: [birth year] Sex: male 11/19/2019 Pre-operative Diagnosis: Right leg rest pain Post-operative diagnosis:  Same Surgeon:  Annamarie Major Procedure Performed:  1.  Ultrasound-guided access, left  femoral artery  2.  Abdominal aortogram with CO2  3.  Bilateral femoral runoff  4.  Conscious sedation 24 minutes  5.  Closure device, Mynx Indications: The patient was admitted with rest pain in the right leg.  He comes in today for angiography Procedure:  The patient was identified in the holding area and taken to room 8.  The patient was then placed supine on the table and prepped and draped in the usual sterile fashion.  A time out was called.  Conscious sedation was administered with the use of IV fentanyl and Versed under continuous physician and nurse monitoring.  Heart rate, blood pressure, and oxygen saturation were continuously monitored.  Total sedation time was 24 minutes.  Ultrasound was used to evaluate the left common femoral artery.  It was patent .  A digital ultrasound image was acquired.  A micropuncture needle was used to access the left common femoral artery under ultrasound guidance.  An 018 wire was advanced without resistance and a micropuncture sheath was placed.  The 018 wire was removed and a benson wire was placed.  The micropuncture sheath was exchanged for a 5 french sheath.  An omniflush catheter was advanced over the wire to the level of L-1.  An abdominal angiogram was obtained with CO2.  Next the catheter was pulled down to the aortic bifurcation and additional imaging with CO2 and contrast was performed. Findings:  Aortogram with bifemoral runoff: The infrarenal abdominal aorta is heavily calcified but patent without stenosis.  The right common iliac artery is occluded at its origin.  There is reconstitution of the profundofemoral artery.  The common femoral artery is occluded.  On the left there are greater than 50% lesions within the common and external iliac artery and significant near occlusive disease within the common femoral artery.  Bilateral superficial femoral arteries are occluded.  Intervention: None Impression:  #1  Occluded right iliac system and right common femoral  artery  #2 multiple areas of stenosis within the left common and external iliac artery and significant stenosis within the common femoral artery on the left.  #3  The patient will be considered for left iliac stenting with bilateral femoral endarterectomy and left to right femoral-femoral bypass V. Annamarie Major, M.D., [facility] [place] Office: [phone] Pager:  [phone]   Cardiac Studies   Relevant CV Studies: RHC:10/02/2019 Conclusion  Moderate pulmonary hypertension with mean PA pressure of 42 mmHg.  Mean pulmonary capillary wedge pressure 28 mmHg.  Phasic pulmonary artery pressure 63/29 mmHg, mean RA pressure 20 mmHg, and pulmonary artery pulsatility index is 1.7 (suggesting RV dysfunction)  Mixed venous O2  saturation 57%   ECHO: 09/29/19 1. Left ventricular ejection fraction, by visual estimation, is 25 to  30%. The left ventricle has severely decreased function. There is mildly  increased left ventricular hypertrophy.  2. Left ventricular diastolic function could not be evaluated.  3. Severely dilated left ventricular internal cavity size.  4. The left ventricle demonstrates global hypokinesis.  5. Global right ventricle has severely reduced systolic function.The  right ventricular size is mildly enlarged.  6. Left atrial size was severely dilated.  7. The interatrial septum is aneurysmal.  8. Right atrial size was severely dilated.  9. The mitral valve is normal in structure. Moderate mitral valve  regurgitation. No evidence of mitral stenosis.  10. The tricuspid valve is normal in structure. Tricuspid valve  regurgitation is mild.  11. The aortic valve is tricuspid. Aortic valve regurgitation is mild.  Mild to moderate aortic valve sclerosis/calcification without any evidence  of aortic stenosis.  12. The pulmonic valve was normal in structure. Pulmonic valve  regurgitation is mild.  13. Moderately elevated pulmonary  artery systolic pressure.  14. The inferior vena cava is dilated in size with <50% respiratory  variability, suggesting right atrial pressure of 15 mmHg.  15. Severe global reduction in LV systolic function; mild LVH; 4 chamber  enlargement; severe RV dysfunction; mild AI; moderate MR; mild TR;  moderate pulmonary hypertension.    Patient Profile     79 y.o. male with a history of permanent atrial fibrillation, extensive CAD, history of V. fib arrest, ischemic cardiomyopathy with severely depressed left ventricular systolic function and severe biventricular systolic failure, chronic respiratory failure with hypoxia on home oxygen 2 L, COPD CKD 3B, severe PAD, iron deficiency anemia  Assessment & Plan    1. CHF: He is still volume overloaded, and pounds above dry weight.  We will give him IV diuretics today since he had a mediocre response to the oral torsemide.  I had to negotiate with him for him to stay in the hospital 1 more day.  He wants to go home since his son and grandson are flying in from [place] to [place] tomorrow.  I told him I had be concerned that he is at risk for quick readmission unless we provide some additional diuresis.  Unable to use RAAS inhibitors.  We will try to honor our agreement today to let him go home tomorrow if he diuresis well.  He has a follow-up appoint with Dr. [name] already scheduled on March 22. 2. AFib: Rate controlled. On IV heparin.  I would not recommend warfarin.  He should be on DOAC, preferably Eliquis due to his history of GI bleeding.  Note erosive gastropathy on EGD earlier this month, colonoscopy with diverticulosis. 3. COPD: On oxygen at 2 L, which is what he uses chronically at home. 4. PAD: Discussed plans for revascularization with Dr. Trula Slade, he will be seeing him back in a couple of weeks.  In the meantime we need to get him back in optimal hemodynamic state.  He is at high risk for major cardiovascular complications with vascular  surgery.  In his current hemodynamic state, the risk would be prohibitive. 5. CKD 3b: creatinine better than pre-aortogram. Recheck in AM.      For questions or updates, please contact [facility] Please consult [email or web address] for contact info under        Signed, Sanda Klein, MD  11/20/2019, 9:35 AM

## 2019-11-20 NOTE — Progress Notes (Signed)
[facility] for heparin Indication: AFib/PVD  No Known Allergies  Patient Measurements: Height: 6\' 6"  (198.1 cm) Weight: 198 lb 14.4 oz (90.2 kg)(scale a) IBW/kg (Calculated) : 91.4 Heparin Dosing Weight: 88kg  Vital Signs: Temp: 98 F (36.7 C) (03/17 0512) Temp Source: Oral (03/17 0512) BP: 107/63 (03/17 0512) Pulse Rate: 81 (03/17 0512)  Labs: Recent Labs    11/18/19 0522 11/18/19 0522 11/19/19 0306 11/19/19 1713 11/20/19 0511  HGB 9.6*  --  9.1*  --   --   HCT 29.2*  --  27.6*  --   --   PLT 263  --  255  --   --   HEPARINUNFRC 0.30   < > 0.29* 0.19* 0.12*  CREATININE 2.34*  --  2.32*  --   --    < > = values in this interval not displayed.    Estimated Creatinine Clearance: 34.6 mL/min (A) (by C-G formula based on SCr of 2.32 mg/dL (H)).  Assessment: 79 y.o. male with h/o Afib, Eliquis on hold, for heparin  Goal of Therapy:  Heparin level 0.3-0.7 units/ml Monitor platelets by anticoagulation protocol: Yes   Plan:  Heparin 2000 units IV bolus, then increase heparin 1500 units/hr Check heparin level in 6 hours.   Phillis Knack, PharmD, BCPS    11/20/2019, 6:11 AM

## 2019-11-20 NOTE — Progress Notes (Addendum)
    Subjective  - POD #1  He is not complaining of any leg discomfort today  His son is [place] home from [place] and he very much would like to go home to see him   Physical Exam:  Left femoral cannulation site is without hematoma  Both legs are warm and perfused Abdomen is soft Respirations nonlabored       Assessment/Plan:  POD #1  Acute on chronic lower extremity ischemia, right greater than left: The patient will need surgical revascularization to improve his current symptomatology.  He strongly desires to go home to see his son whom he has not seen in a long time because he has been overseas.  Currently he is not having any leg discomfort.  He feels that he is safe to go home.  I suspect heparin has improved his blood flow a little bit and so if he goes home today, I would consider placing him on Xarelto.  He most likely will need left iliac stenting with shockwave and left to right femoral-femoral bypass graft to help lower extremity perfusion.  He potentially could be a candidate for an aortobifemoral bypass graft.  I would like for cardiology to weigh in on his surgical risk prior to discharge.  Again I am comfortable with him going home today.  I will schedule him to see me in the office in a few weeks to discuss surgical options.  Logan White 11/20/2019 7:24 AM --  Vitals:   11/19/19 1928 11/20/19 0512  BP: (!) 145/77 107/63  Pulse: 95 81  Resp: 19 16  Temp: 97.9 F (36.6 C) 98 F (36.7 C)  SpO2: 100% 90%    Intake/Output Summary (Last 24 hours) at 11/20/2019 0724 Last data filed at 11/20/2019 0520 Gross per 24 hour  Intake 1440 ml  Output 1700 ml  Net -260 ml     Laboratory CBC    Component Value Date/Time   WBC 8.7 11/19/2019 0306   HGB 9.1 (L) 11/19/2019 0306   HGB 12.7 (L) 07/29/2019 1004   HCT 27.6 (L) 11/19/2019 0306   HCT 38.3 07/29/2019 1004   PLT 255 11/19/2019 0306   PLT 191 07/29/2019 1004    BMET    Component Value Date/Time    NA 137 11/20/2019 0511   NA 139 10/29/2019 1139   K 4.4 11/20/2019 0511   CL 100 11/20/2019 0511   CO2 24 11/20/2019 0511   GLUCOSE 139 (H) 11/20/2019 0511   BUN 42 (H) 11/20/2019 0511   BUN 37 (H) 10/29/2019 1139   CREATININE 2.05 (H) 11/20/2019 0511   CREATININE 1.34 (H) 10/31/2016 0844   CALCIUM 8.9 11/20/2019 0511   GFRNONAA 31 (L) 11/20/2019 0511   GFRNONAA 46 (L) 10/24/2014 0943   GFRAA 35 (L) 11/20/2019 0511   GFRAA 53 (L) 10/24/2014 0943    COAG Lab Results  Component Value Date   INR 2.5 (H) 09/30/2019   INR 1.5 (H) 06/22/2019   INR 1.1 (A) 05/04/2018   No results found for: PTT  Antibiotics Anti-infectives (From admission, onward)   None       V. Leia Alf, M.D., [facility] [place] Office: [phone] Pager:  [phone]

## 2019-11-20 NOTE — Progress Notes (Signed)
Physical Therapy Treatment Patient Details Name: Logan White MRN: [id number] DOB: 12-12-1940 Today's Date: 11/20/2019    History of Present Illness 79 y.o. male with medical history significant of afib on Coumadin; PVD; HLD; h/o CVA; DM; CAD s/p stent; stage 4 CKD; and AAA presenting with B leg pain.  He reports that he is here due to his R > L legs - pain, they go numb on him. Pt with recent admission 3/1-3/5 for RLE weakness, CHF exacerbation, and anemia. Pt scheduled for aortogram monday with pending intervention following per vascular surgery.    PT Comments    Pt in bed upon arrival of PT, agreeable to PT session despite stated frustration about having to stay in hospital one more day. The pt continues to present with limitations in functional mobility and endurance compared to their prior level of function and independence due to above dx. The pt was able to demonstrate ambulation in hall with use of RW and 3L O2, but began to demo sig increased work of breathing with continued ambulation. The pt requested to sit immediately upon return to room but denied pain, SOB, or fatigue. The pt will continue to benefit from skilled PT to progress functional endurance and strengthening to facilitate greater independence after return home.    Follow Up Recommendations  No PT follow up;Supervision - Intermittent     Equipment Recommendations  Rolling walker with 5" wheels    Recommendations for Other Services       Precautions / Restrictions Precautions Precautions: Fall Precaution Comments: pt previously reported R LE numbness/heaviness with ambulation, denies sx today Restrictions Weight Bearing Restrictions: No LLE Weight Bearing: Weight bearing as tolerated    Mobility  Bed Mobility Overal bed mobility: Modified Independent Bed Mobility: Supine to Sit;Sit to Supine     Supine to sit: Modified independent (Device/Increase time) Sit to supine: Modified independent  (Device/Increase time)   General bed mobility comments: pt supine in bed upon arrival, able to transition to sitting without assist, some use of bed rails but not necessary  Transfers Overall transfer level: Needs assistance Equipment used: Rolling walker (2 wheeled) Transfers: Sit to/from Stand Sit to Stand: Supervision         General transfer comment: for safety  Ambulation/Gait Ambulation/Gait assistance: Supervision Gait Distance (Feet): 80 Feet Assistive device: Rolling walker (2 wheeled) Gait Pattern/deviations: Step-through pattern Gait velocity: functional Gait velocity interpretation: 1.31 - 2.62 ft/sec, indicative of limited community ambulator General Gait Details: pt with steady step through gait, slow and deliberate turns during mobility, demo sig increase work of breathing with continued ambulation, denies fatigue or SOB, SpO2 100% once seated in room.   Stairs             Wheelchair Mobility    Modified Rankin (Stroke Patients Only)       Balance Overall balance assessment: Needs assistance Sitting-balance support: No upper extremity supported;Feet supported Sitting balance-Leahy Scale: Normal     Standing balance support: Bilateral upper extremity supported;During functional activity Standing balance-Leahy Scale: Fair Standing balance comment: supervision with unilateral UE support of RW                            Cognition Arousal/Alertness: Awake/alert Behavior During Therapy: Agitated;Flat affect Overall Cognitive Status: Within Functional Limits for tasks assessed  General Comments: Pt had just been told he would have to stay in hospital one more day when he is eager to get home to see his son who has been abroad for 2+ years. He was furstrated with the situation and upset that he did not get good sleep last night, the pt was still cooperative with PT, but understandably frustrated.       Exercises      General Comments General comments (skin integrity, edema, etc.): pt on 3L O2, reports he uses 3L O2 at home. Pt not on pulse ox in room, Spo2 read 100% once seated after ambulation      Pertinent Vitals/Pain Pain Assessment: No/denies pain Pain Intervention(s): Limited activity within patient's tolerance;Monitored during session    Home Living                      Prior Function            PT Goals (current goals can now be found in the care plan section) Acute Rehab PT Goals Patient Stated Goal: To reduce pain in RLE PT Goal Formulation: With patient Time For Goal Achievement: 11/30/19 Potential to Achieve Goals: Good Additional Goals Additional Goal #1: Pt will maintain dynamic standing balance within 10 inches of his base of support without UE support independently. Progress towards PT goals: Progressing toward goals    Frequency    Min 3X/week      PT Plan Current plan remains appropriate    Co-evaluation              AM-PAC PT "6 Clicks" Mobility   Outcome Measure  Help needed turning from your back to your side while in a flat bed without using bedrails?: None Help needed moving from lying on your back to sitting on the side of a flat bed without using bedrails?: None Help needed moving to and from a bed to a chair (including a wheelchair)?: None Help needed standing up from a chair using your arms (e.g., wheelchair or bedside chair)?: None Help needed to walk in hospital room?: None Help needed climbing 3-5 steps with a railing? : A Little 6 Click Score: 23    End of Session Equipment Utilized During Treatment: Oxygen Activity Tolerance: Patient tolerated treatment well Patient left: with call bell/phone within reach;with bed alarm set;in bed Nurse Communication: Mobility status PT Visit Diagnosis: Difficulty in walking, not elsewhere classified (R26.2);Pain Pain - Right/Left: Right Pain - part of body: Leg      Time: 0922-0950 PT Time Calculation (min) (ACUTE ONLY): 28 min  Charges:  $Gait Training: 23-37 mins                     Karma Ganja, PT, DPT   Acute Rehabilitation Department Pager #: [phone]   Otho Bellows 11/20/2019, 12:17 PM

## 2019-11-20 NOTE — Progress Notes (Signed)
Pt requests med for sleep. MD paged. 

## 2019-11-20 NOTE — Progress Notes (Signed)
[facility] Progress Note    Assessment/ Plan:   1.  AKI on CKD 3b/IV: getting better with diuresis actually. Stable this AM.  S/p aortogram today, appears like he's getting more vol overloaded- s/p  IVFs.  Was on PO torsemide but changed to IV Lasix today.  2.  PAD: s/p aortogram, multiple occlusions, will need some bypass most likely  3. A Fib: on Coumadin, hep gtt here with procedures  4.  Dispo: pending  Subjective:    Cr 2.05 this AM. Wife at bedside.     Objective:   BP 126/63 (BP Location: Left Arm)   Pulse 87   Temp (!) 97.4 F (36.3 C) (Oral)   Resp 20   Ht 6\' 6"  (1.981 m)   Wt 90.2 kg Comment: scale a  SpO2 (!) 71%   BMI 22.99 kg/m   Intake/Output Summary (Last 24 hours) at 11/20/2019 1535 Last data filed at 11/20/2019 1300 Gross per 24 hour  Intake 1560 ml  Output 1900 ml  Net -340 ml   Weight change: 0.998 kg  Physical Exam: GEN sitting up in bed, NAD HEENT EOMI PERRL NECK decreased JVD PULM wheezing improved CV irreg ABD soft nontender EXT cool legs with poor pulses and edema NEURO AAO x 3 SKIN prior area of skin graft  Imaging: PERIPHERAL VASCULAR CATHETERIZATION  Result Date: 11/19/2019 Patient name: Logan White MRN: [id number] DOB: 1941/08/12 Sex: male 11/19/2019 Pre-operative Diagnosis: Right leg rest pain Post-operative diagnosis:  Same Surgeon:  Annamarie Major Procedure Performed:  1.  Ultrasound-guided access, left femoral artery  2.  Abdominal aortogram with CO2  3.  Bilateral femoral runoff  4.  Conscious sedation 24 minutes  5.  Closure device, Mynx Indications: The patient was admitted with rest pain in the right leg.  He comes in today for angiography Procedure:  The patient was identified in the holding area and taken to room 8.  The patient was then placed supine on the table and prepped and draped in the usual sterile fashion.  A time out was called.  Conscious sedation was administered with the use of IV fentanyl and  Versed under continuous physician and nurse monitoring.  Heart rate, blood pressure, and oxygen saturation were continuously monitored.  Total sedation time was 24 minutes.  Ultrasound was used to evaluate the left common femoral artery.  It was patent .  A digital ultrasound image was acquired.  A micropuncture needle was used to access the left common femoral artery under ultrasound guidance.  An 018 wire was advanced without resistance and a micropuncture sheath was placed.  The 018 wire was removed and a benson wire was placed.  The micropuncture sheath was exchanged for a 5 french sheath.  An omniflush catheter was advanced over the wire to the level of L-1.  An abdominal angiogram was obtained with CO2.  Next the catheter was pulled down to the aortic bifurcation and additional imaging with CO2 and contrast was performed. Findings:  Aortogram with bifemoral runoff: The infrarenal abdominal aorta is heavily calcified but patent without stenosis.  The right common iliac artery is occluded at its origin.  There is reconstitution of the profundofemoral artery.  The common femoral artery is occluded.  On the left there are greater than 50% lesions within the common and external iliac artery and significant near occlusive disease within the common femoral artery.  Bilateral superficial femoral arteries are occluded.  Intervention: None Impression:  #1  Occluded right iliac system  and right common femoral artery  #2 multiple areas of stenosis within the left common and external iliac artery and significant stenosis within the common femoral artery on the left.  #3  The patient will be considered for left iliac stenting with bilateral femoral endarterectomy and left to right femoral-femoral bypass V. Annamarie Major, M.D., FACS [facility] [place] Office: [phone] Pager:  [phone]   Labs: BMET Recent Labs  Lab 11/15/19 1110 11/16/19 0256 11/17/19 0448 11/18/19 0522  11/19/19 0306 11/20/19 0511  NA 136 134* 134* 138 137 137  K 3.9 4.3 4.0 4.2 4.5 4.4  CL 100 101 100 100 101 100  CO2 22 18* 20* 23 26 24   GLUCOSE 91 139* 128* 100* 121* 139*  BUN 40* 49* 50* 44* 43* 42*  CREATININE 2.56* 2.74* 2.43* 2.34* 2.32* 2.05*  CALCIUM 8.6* 8.6* 8.8* 9.3 9.0 8.9  PHOS  --   --  4.3 3.7 3.4 3.0   CBC Recent Labs  Lab 11/15/19 2333 11/15/19 2333 11/16/19 0256 11/17/19 0448 11/18/19 0522 11/19/19 0306  WBC 8.4   < > 8.2 8.9 9.2 8.7  NEUTROABS 6.0  --   --   --   --   --   HGB 9.1*   < > 9.0* 9.4* 9.6* 9.1*  HCT 27.5*   < > 27.0* 28.0* 29.2* 27.6*  MCV 76.6*   < > 76.9* 76.3* 77.5* 77.7*  PLT 261   < > 254 280 263 255   < > = values in this interval not displayed.    Medications:    . aspirin EC  81 mg Oral Daily  . atorvastatin  80 mg Oral Daily  . feeding supplement (ENSURE ENLIVE)  237 mL Oral TID BM  . furosemide  80 mg Intravenous Q12H  . metoprolol succinate  50 mg Oral Daily  . mometasone-formoterol  2 puff Inhalation BID  . multivitamin with minerals  1 tablet Oral Daily  . pantoprazole  40 mg Oral BID  . potassium chloride SA  20 mEq Oral BID  . sodium chloride flush  3 mL Intravenous Q12H  . sodium chloride flush  3 mL Intravenous Q12H      Madelon Lips, MD 11/20/2019, 3:35 PM

## 2019-11-20 NOTE — Progress Notes (Signed)
[facility]  PROGRESS NOTE  DARY DILAURO ZOX:[id number] DOB: 07-10-1941 DOA: 11/15/2019 PCP: Patient, No Pcp Per   Brief HPI:   79 year old male with a history of atrial fibrillation on Eliquis, COPD/chronic right heart failure on 2 L oxygen by nasal cannula, PVD, pulmonary hypertension, AAA, CVA, diabetes mellitus type 2, CAD status post stent, CKD stage IIIb/IV, biventricular heart failure with EF 25 to 30% presented with bilateral extremity pain and numbness.  Right more than left, also had shortness of breath.  Patient was seen by vascular surgery for limb threatening ischemia/PAD.  Chest x-ray showed pulmonary edema.  Vascular surgery recommended aortogram, patient underwent aortogram which revealed occluded right iliac system and right common femoral artery, multiple area of stenosis within the left common and external iliac artery and significant stenosis with common femoral artery on the left.  As per vascular surgery patient to be considered for left iliac stenting and bilateral femoral endarterectomy and left right femoral to femoral bypass.  Nephrology also was consulted due to increased risk for contrast-induced nephropathy.    Subjective   Patient seen and examined, still has shortness of breath, wheezing.  Cardiology has started patient on diuretics for CHF exacerbation.   Assessment/Plan:     1. Peripheral vascular disease/limb threatening ischemia-acute on chronic lower extremity ischemia, right more than left.  As per vascular surgery patient will need surgical revascularization to improve the current symptomatology.  Patient will need left leg stenting with shockwave and left right femoral to femoral bypass graft to help lower extremity perfusion.  He also could be a candidate for aorto bifemoral bypass graft.  Patient will go home on Xarelto once he is ready for discharge.  As patient wants to go home, vascular surgery has cleared him for discharge with outpatient  follow-up to discuss surgical options.  2. Acute on chronic systolic right heart failure-patient's EF is 25 to 81%, unknown diastolic function.  Shows global hypokinesis, severely reduced RV SF, severe LAE/RAE, interatrial septal aneurysm and moderate pulmonary hypertension.  Right heart catheter on 1/21 showed pulmonary hypertension and RV dysfunction.  Chest x-ray was concerning for pulmonary edema.  BNP was 2400.  Diuretics has been restarted per cardiology.  Continue Lasix 80 mg IV twice daily.  Follow BMP in a.m.  3. Acute kidney injury on CKD stage IIIb/IV-patient creatinine has improved to 2.05, likely at baseline.  Started on IV Lasix as above.  Will closely monitor patient's renal function in a.m.  4. Persistent atrial fibrillation-patient was on high-dose metoprolol and Eliquis prior to admission.  Currently is on IV heparin, metoprolol 50 mg p.o. daily.  Heart rate is controlled.  Patient will be discharged home on Eliquis.  5. Hyperlipidemia-continue Lipitor.  6. CAD s/p DES to LAD in 2004, PCI in 2014-continue beta-blocker, statin, aspirin.  7. Chronic respiratory failure-patient has underlying COPD, on 2 L/min of oxygen at baseline.  Continue Dulera, as needed albuterol.    SpO2: (!) 71 % O2 Flow Rate (L/min): 3 L/min   COVID-19 Labs  No results for input(s): DDIMER, FERRITIN, LDH, CRP in the last 72 hours.  Lab Results  Component Value Date   SARSCOV2NAA NEGATIVE 11/15/2019   [place] NEGATIVE 11/04/2019   [place] NEGATIVE 09/28/2019   Page Not Detected 08/05/2019     CBG: No results for input(s): GLUCAP in the last 168 hours.  CBC: Recent Labs  Lab 11/15/19 2333 11/16/19 0256 11/17/19 0448 11/18/19 0522 11/19/19 0306  WBC 8.4 8.2 8.9 9.2 8.7  NEUTROABS 6.0  --   --   --   --   HGB 9.1* 9.0* 9.4* 9.6* 9.1*  HCT 27.5* 27.0* 28.0* 29.2* 27.6*  MCV 76.6* 76.9* 76.3* 77.5* 77.7*  PLT 261 254 280 263 628    Basic Metabolic Panel: Recent Labs   Lab 11/15/19 1110 11/15/19 1318 11/16/19 0256 11/17/19 0448 11/18/19 0522 11/19/19 0306 11/20/19 0511  NA   < >  --  134* 134* 138 137 137  K   < >  --  4.3 4.0 4.2 4.5 4.4  CL   < >  --  101 100 100 101 100  CO2   < >  --  18* 20* 23 26 24   GLUCOSE   < >  --  139* 128* 100* 121* 139*  BUN   < >  --  49* 50* 44* 43* 42*  CREATININE   < >  --  2.74* 2.43* 2.34* 2.32* 2.05*  CALCIUM   < >  --  8.6* 8.8* 9.3 9.0 8.9  MG  --  2.1  --  2.1 2.1 2.1 2.0  PHOS  --   --   --  4.3 3.7 3.4 3.0   < > = values in this interval not displayed.     Liver Function Tests: Recent Labs  Lab 11/17/19 0448 11/18/19 0522 11/19/19 0306 11/20/19 0511  ALBUMIN 3.0* 3.2* 2.9* 2.9*        DVT prophylaxis: Heparin drip  Code Status: Full code  Family Communication: No family at bedside  Disposition Plan: Barrier to discharge-patient is still in CHF exacerbation.  Started on IV diuretics.  Hopefully can be diuresed and discharged home in the next 1 to 2 days.  Patient wants to go home.      Scheduled medications:  . aspirin EC  81 mg Oral Daily  . atorvastatin  80 mg Oral Daily  . feeding supplement (ENSURE ENLIVE)  237 mL Oral TID BM  . furosemide  80 mg Intravenous Q12H  . metoprolol succinate  50 mg Oral Daily  . mometasone-formoterol  2 puff Inhalation BID  . multivitamin with minerals  1 tablet Oral Daily  . pantoprazole  40 mg Oral BID  . potassium chloride SA  20 mEq Oral BID  . sodium chloride flush  3 mL Intravenous Q12H  . sodium chloride flush  3 mL Intravenous Q12H    Consultants:  Vascular surgery  Cardiology  Procedures:  Aortogram  Antibiotics:   Anti-infectives (From admission, onward)   None       Objective   Vitals:   11/20/19 0512 11/20/19 0753 11/20/19 1209 11/20/19 1242  BP: 107/63   126/63  Pulse: 81  88 87  Resp: 16   20  Temp: 98 F (36.7 C)   (!) 97.4 F (36.3 C)  TempSrc: Oral   Oral  SpO2: 90% 92% 92% (!) 71%  Weight: 90.2 kg      Height:        Intake/Output Summary (Last 24 hours) at 11/20/2019 1333 Last data filed at 11/20/2019 1300 Gross per 24 hour  Intake 1560 ml  Output 2300 ml  Net -740 ml    03/15 1901 - 03/17 0700 In: 3662 [P.O.:1680; I.V.:57] Out: 2300 [Urine:2300]  Filed Weights   11/18/19 0500 11/19/19 0436 11/20/19 0512  Weight: 88.5 kg 89.2 kg 90.2 kg    Physical Examination:    General-appears in no acute distress  Heart-S1-S2, regular, no murmur auscultated  Lungs-bilateral wheezing auscultated  Abdomen-soft, nontender, no organomegaly  Extremities-no edema in the lower extremities  Neuro-alert, oriented x3, no focal deficit noted    Data Reviewed:   Recent Results (from the past 240 hour(s))  SARS CORONAVIRUS 2 (TAT 6-24 HRS) Nasopharyngeal Nasopharyngeal Swab     Status: None   Collection Time: 11/15/19  2:18 PM   Specimen: Nasopharyngeal Swab  Result Value Ref Range Status   SARS Coronavirus 2 NEGATIVE NEGATIVE Final    Comment: (NOTE) SARS-CoV-2 target nucleic acids are NOT DETECTED. The SARS-CoV-2 RNA is generally detectable in upper and lower respiratory specimens during the acute phase of infection. Negative results do not preclude SARS-CoV-2 infection, do not rule out co-infections with other pathogens, and should not be used as the sole basis for treatment or other patient management decisions. Negative results must be combined with clinical observations, patient history, and epidemiological information. The expected result is Negative. Fact Sheet for Patients: SugarRoll.be Fact Sheet for Healthcare Providers: https://www.woods-mathews.com/ This test is not yet approved or cleared by the [place] FDA and  has been authorized for detection and/or diagnosis of SARS-CoV-2 by FDA under an Emergency Use Authorization (EUA). This EUA will remain  in effect (meaning this test can be used) for the duration of  the COVID-19 declaration under Section 56 4(b)(1) of the Act, 21 U.S.C. section 360bbb-3(b)(1), unless the authorization is terminated or revoked sooner. Performed at [facility], [place] [address], [place], [place] [address]     BNP (last 3 results) Recent Labs    09/28/19 1829 11/04/19 1241 11/15/19 1110  BNP 2,423.4* 1,655.3* 2,374.7*    ProBNP (last 3 results) Recent Labs    08/15/19 0949  PROBNP 16,511*    Studies:  PERIPHERAL VASCULAR CATHETERIZATION  Result Date: 11/19/2019 Patient name: KHAMERON GRUENWALD MRN: [id number] DOB: October 25, 1940 Sex: male 11/19/2019 Pre-operative Diagnosis: Right leg rest pain Post-operative diagnosis:  Same Surgeon:  Annamarie Major Procedure Performed:  1.  Ultrasound-guided access, left femoral artery  2.  Abdominal aortogram with CO2  3.  Bilateral femoral runoff  4.  Conscious sedation 24 minutes  5.  Closure device, Mynx Indications: The patient was admitted with rest pain in the right leg.  He comes in today for angiography Procedure:  The patient was identified in the holding area and taken to room 8.  The patient was then placed supine on the table and prepped and draped in the usual sterile fashion.  A time out was called.  Conscious sedation was administered with the use of IV fentanyl and Versed under continuous physician and nurse monitoring.  Heart rate, blood pressure, and oxygen saturation were continuously monitored.  Total sedation time was 24 minutes.  Ultrasound was used to evaluate the left common femoral artery.  It was patent .  A digital ultrasound image was acquired.  A micropuncture needle was used to access the left common femoral artery under ultrasound guidance.  An 018 wire was advanced without resistance and a micropuncture sheath was placed.  The 018 wire was removed and a benson wire was placed.  The micropuncture sheath was exchanged for a 5 french sheath.  An omniflush catheter was advanced over the wire to the level of  L-1.  An abdominal angiogram was obtained with CO2.  Next the catheter was pulled down to the aortic bifurcation and additional imaging with CO2 and contrast was performed. Findings:  Aortogram with bifemoral runoff: The infrarenal abdominal aorta is heavily calcified but patent  without stenosis.  The right common iliac artery is occluded at its origin.  There is reconstitution of the profundofemoral artery.  The common femoral artery is occluded.  On the left there are greater than 50% lesions within the common and external iliac artery and significant near occlusive disease within the common femoral artery.  Bilateral superficial femoral arteries are occluded.  Intervention: None Impression:  #1  Occluded right iliac system and right common femoral artery  #2 multiple areas of stenosis within the left common and external iliac artery and significant stenosis within the common femoral artery on the left.  #3  The patient will be considered for left iliac stenting with bilateral femoral endarterectomy and left to right femoral-femoral bypass V. Annamarie Major, M.D., [facility] [place] Office: [phone] Pager:  [phone]    Admission status: The appropriate admission status for this patient is INPATIENT. Inpatient status is judged to be reasonable and necessary in order to provide the required intensity of service to ensure the patient's safety. The patient's presenting symptoms, physical exam findings, and initial radiographic and laboratory data in the context of their chronic comorbidities is felt to place them at high risk for further clinical deterioration. Furthermore, it is not anticipated that the patient will be medically stable for discharge from the hospital within 2 midnights of admission. The following factors support the admission status of inpatient.     The patient's presenting symptoms include bilateral lower extremity pain The worrisome physical exam  findings include critical limb ischemia The initial radiographic and laboratory data are worrisome because of aortogram showed occluded right iliac, right common femoral artery. The chronic co-morbidities include congestive heart failure exacerbation, on IV diuretics.       * I certify that at the point of admission it is my clinical judgment that the patient will require inpatient hospital care spanning beyond 2 midnights from the point of admission due to high intensity of service, high risk for further deterioration and high frequency of surveillance required.Oswald Hillock   [facility] If 7PM-7AM, please contact night-coverage at [email or web address], Office  [phone]   11/20/2019, 1:33 PM  LOS: 5 days

## 2019-11-20 NOTE — Progress Notes (Signed)
[place] for heparin Indication: AFib/PVD  No Known Allergies  Patient Measurements: Height: 6\' 6"  (198.1 cm) Weight: 198 lb 14.4 oz (90.2 kg)(scale a) IBW/kg (Calculated) : 91.4 Heparin Dosing Weight: 88kg  Vital Signs: Temp: 97.4 F (36.3 C) (03/17 1242) Temp Source: Oral (03/17 1242) BP: 126/63 (03/17 1242) Pulse Rate: 87 (03/17 1242)  Labs: Recent Labs    11/18/19 0522 11/18/19 0522 11/19/19 0306 11/19/19 0306 11/19/19 1713 11/20/19 0511 11/20/19 1355  HGB 9.6*  --  9.1*  --   --   --   --   HCT 29.2*  --  27.6*  --   --   --   --   PLT 263  --  255  --   --   --   --   HEPARINUNFRC 0.30   < > 0.29*   < > 0.19* 0.12* 0.69  CREATININE 2.34*  --  2.32*  --   --  2.05*  --    < > = values in this interval not displayed.    Estimated Creatinine Clearance: 39.1 mL/min (A) (by C-G formula based on SCr of 2.05 mg/dL (H)).  Assessment: 79 y.o. male with h/o Afib, Eliquis on hold, for heparin  Heparin level upper end therapeutic s/p rate increase to 1500 units/hr  Goal of Therapy:  Heparin level 0.3-0.7 units/ml Monitor platelets by anticoagulation protocol: Yes   Plan:  Decrease heparin gtt to 1450 units/hr F/u 8 hour heparin level to confirm   Bertis Ruddy, PharmD Clinical Pharmacist Please check AMION for all [place] numbers 11/20/2019 2:27 PM

## 2019-11-20 NOTE — Care Management Important Message (Signed)
Important Message  Patient Details  Name: Logan White MRN: [id number] Date of Birth: [birth year]   Medicare Important Message Given:  Yes     Shelda Altes 11/20/2019, 12:36 PM

## 2019-11-20 NOTE — Progress Notes (Signed)
Nutrition Follow-up  DOCUMENTATION CODES:   Not applicable  INTERVENTION:   -Continue Magic cup BID with meals, each supplement provides 290 kcal and 9 grams of protein -Continue Ensure Enlive po TID, each supplement provides 350 kcal and 20 grams of protein -Continue MVI with minerals daily  NUTRITION DIAGNOSIS:   Increased nutrient needs related to chronic illness(CHF) as evidenced by estimated needs.  Ongoing  GOAL:   Patient will meet greater than or equal to 90% of their needs  Progressing   MONITOR:   PO intake, Supplement acceptance, Labs, Weight trends, Skin, I & O's  REASON FOR ASSESSMENT:   Consult Assessment of nutrition requirement/status  ASSESSMENT:   Logan White is a 79 y.o. male with medical history significant of afib on Coumadin; PVD; HLD; h/o CVA; DM; CAD s/p stent; stage 4 CKD; and AAA presenting with B leg pain.  He reports that he is here due to his R > L legs - pain, they go numb on him.  He reports that no one has told him anything about his legs, just tried to get the fluid down on previous visits.  He has increased LE a couple of days ago.  He is SOB.  Since he left the hospital last Thursday, he had no problems until yesterday.  His R leg went numb and then the feeling came back and he went back inside.  He woke up this AM and his legs were going numb on him.  He tried getting dressed this AM and he just couldn't do it because of leg numbness.  The SOB is a secondary issue.  No orthopnea.  He got extra fluid pills from Dr. [name] and he started peeing more yesterday.  3/16- s/p aortograms- revealed occluded right iliac system and right common femoral artery; multiple areas of stenosis within the left common and external iliac artery and significant stenosis within the common femoral artery on the left  Reviewed I/O's: -260 ml x 24 hours and -1.8 L since admission  UOP: 1.7 L x 24 hours  Per [facility] notes, pt to be considered for left iliac  stenting with bilateral femoral endarterectomy and left to right femoral-femoral bypass.   Pt receiving nursing care at time of visit.   Per chart review, pt very anxious to go home, due to his [place] in [place]. Plan to discharge home in 1-2 days per MD.   Pt with variable appetite; noted meal completion 25-75%. He has been consuming Ensure supplements per MAR.   Medications reviewed and include IV lasix.   Labs reviewed.   Diet Order:   Diet Order            Diet regular Room service appropriate? Yes; Fluid consistency: Thin  Diet effective now              EDUCATION NEEDS:   No education needs have been identified at this time  Skin:  Skin Assessment: Reviewed RN Assessment  Last BM:  11/20/19  Height:   Ht Readings from Last 1 Encounters:  11/15/19 6\' 6"  (1.981 m)    Weight:   Wt Readings from Last 1 Encounters:  11/20/19 90.2 kg    Ideal Body Weight:  97.3 kg  BMI:  Body mass index is 22.99 kg/m.  Estimated Nutritional Needs:   Kcal:  2263-3354  Protein:  130-145 grams  Fluid:  > 2.4 L    Loistine Chance, RD, LDN, [place] Registered Dietitian II Certified Diabetes Care and  Education Specialist Please refer to [facility] for RD and/or RD on-call/weekend/after hours pager

## 2019-11-21 LAB — RENAL FUNCTION PANEL
Albumin: 2.9 g/dL — ABNORMAL LOW (ref 3.5–5.0)
Anion gap: 12 (ref 5–15)
BUN: 41 mg/dL — ABNORMAL HIGH (ref 8–23)
CO2: 26 mmol/L (ref 22–32)
Calcium: 8.9 mg/dL (ref 8.9–10.3)
Chloride: 97 mmol/L — ABNORMAL LOW (ref 98–111)
Creatinine, Ser: 1.97 mg/dL — ABNORMAL HIGH (ref 0.61–1.24)
GFR calc Af Amer: 37 mL/min — ABNORMAL LOW (ref >60)
GFR calc non Af Amer: 32 mL/min — ABNORMAL LOW (ref >60)
Glucose, Bld: 109 mg/dL — ABNORMAL HIGH (ref 70–99)
Phosphorus: 3.6 mg/dL (ref 2.5–4.6)
Potassium: 4.6 mmol/L (ref 3.5–5.1)
Sodium: 135 mmol/L (ref 135–145)

## 2019-11-21 LAB — CBC
HCT: 26.6 % — ABNORMAL LOW (ref 39.0–52.0)
Hemoglobin: 8.8 g/dL — ABNORMAL LOW (ref 13.0–17.0)
MCH: 25.7 pg — ABNORMAL LOW (ref 26.0–34.0)
MCHC: 33.1 g/dL (ref 30.0–36.0)
MCV: 77.6 fL — ABNORMAL LOW (ref 80.0–100.0)
Platelets: 238 10*3/uL (ref 150–400)
RBC: 3.43 MIL/uL — ABNORMAL LOW (ref 4.22–5.81)
RDW: 20.6 % — ABNORMAL HIGH (ref 11.5–15.5)
WBC: 7.2 10*3/uL (ref 4.0–10.5)
nRBC: 0.7 % — ABNORMAL HIGH (ref 0.0–0.2)

## 2019-11-21 LAB — MAGNESIUM: Magnesium: 2.1 mg/dL (ref 1.7–2.4)

## 2019-11-21 LAB — HEPARIN LEVEL (UNFRACTIONATED)
Heparin Unfractionated: 0.69 IU/mL (ref 0.30–0.70)
Heparin Unfractionated: 0.71 IU/mL — ABNORMAL HIGH (ref 0.30–0.70)

## 2019-11-21 MED ORDER — HYDRALAZINE HCL 10 MG PO TABS
10.0000 mg | ORAL_TABLET | Freq: Two times a day (BID) | ORAL | Status: DC
  Administered 2019-11-21: 10 mg via ORAL
  Filled 2019-11-21: qty 1

## 2019-11-21 MED ORDER — TORSEMIDE 100 MG PO TABS: 100.0000 mg | ORAL_TABLET | Freq: Two times a day (BID) | ORAL | 2 refills | Status: DC

## 2019-11-21 MED ORDER — ISOSORBIDE MONONITRATE ER 30 MG PO TB24
30.0000 mg | ORAL_TABLET | Freq: Every day | ORAL | Status: DC
  Administered 2019-11-21: 30 mg via ORAL
  Filled 2019-11-21: qty 1

## 2019-11-21 MED ORDER — APIXABAN 5 MG PO TABS
5.0000 mg | ORAL_TABLET | Freq: Two times a day (BID) | ORAL | Status: DC
  Administered 2019-11-21: 5 mg via ORAL
  Filled 2019-11-21: qty 1

## 2019-11-21 MED ORDER — APIXABAN 5 MG PO TABS: 5.0000 mg | ORAL_TABLET | Freq: Two times a day (BID) | ORAL | 0 refills | Status: DC

## 2019-11-21 MED ORDER — METOPROLOL SUCCINATE ER 50 MG PO TB24: 50.0000 mg | ORAL_TABLET | Freq: Every day | ORAL | 3 refills | Status: DC

## 2019-11-21 NOTE — Discharge Summary (Signed)
Physician Discharge Summary  Logan White ZOX:[id number] DOB: 1940/09/15 DOA: 11/15/2019  PCP: Patient, No Pcp Per  Admit date: 11/15/2019 Discharge date: 11/21/2019  Time spent: 50 minutes  Recommendations for Outpatient Follow-up:  1. Follow-up vascular surgery in 3 weeks 2. Follow-up cardiology on 11/25/2019 3. Check BMP on 11/25/2019  Discharge Diagnoses:  Principal Problem:   PVD (peripheral vascular disease) ([place]) Active Problems:   Essential hypertension   DM (diabetes mellitus) ([place])   CKD (chronic kidney disease), stage III   AAA (abdominal aortic aneurysm) ([facility])   CAD S/P PCI   Persistent atrial fibrillation ([facility])   CHF (congestive heart failure), NYHA class IV, acute, systolic ([place])   Palliative care by specialist   Goals of care, counseling/discussion   Discharge Condition: Stable  Diet recommendation: Heart healthy diet  Filed Weights   11/19/19 0436 11/20/19 0512 11/21/19 0327  Weight: 89.2 kg 90.2 kg 91.1 kg    History of present illness:  79 year old male with a history of atrial fibrillation on Eliquis, COPD/chronic right heart failure on 2 L oxygen by nasal cannula, PVD, pulmonary hypertension, AAA, CVA, diabetes mellitus type 2, CAD status post stent, CKD stage IIIb/IV, biventricular heart failure with EF 25 to 30% presented with bilateral extremity pain and numbness.  Right more than left, also had shortness of breath.  Patient was seen by vascular surgery for limb threatening ischemia/PAD.  Chest x-ray showed pulmonary edema.  Vascular surgery recommended aortogram, patient underwent aortogram which revealed occluded right iliac system and right common femoral artery, multiple area of stenosis within the left common and external iliac artery and significant stenosis with common femoral artery on the left.  As per vascular surgery patient to be considered for left iliac stenting and bilateral femoral endarterectomy and left right femoral to femoral bypass.   Nephrology also was consulted due to increased risk for contrast-induced nephropathy.   Hospital Course:   1. Peripheral vascular disease/limb threatening ischemia-acute on chronic lower extremity ischemia, right more than left.  As per vascular surgery patient will need surgical revascularization to improve the current symptomatology.  Patient will need left leg stenting with shockwave and left right femoral to femoral bypass graft to help lower extremity perfusion.  He also could be a candidate for aorto bifemoral bypass graft.    As patient wants to go home, vascular surgery has cleared him for discharge with outpatient follow-up to discuss surgical options.  Patient will be discharged on Eliquis.  2. Acute on chronic systolic right heart failure-patient's EF is 25 to 81%, unknown diastolic function.  Shows global hypokinesis, severely reduced RV SF, severe LAE/RAE, interatrial septal aneurysm and moderate pulmonary hypertension.  Right heart catheter on 1/21 showed pulmonary hypertension and RV dysfunction.  Chest x-ray was concerning for pulmonary edema.  BNP was 2400.  Diuretics has been restarted per cardiology.    Will discharge on torsemide 100 mg p.o. twice daily, Toprol-XL 50 mg daily, potassium chloride 20 mEq p.o. twice daily.  3. Acute kidney injury on CKD stage IIIb/IV-patient creatinine has improved to 1.97, likely at baseline.  Will need repeat be met in 4 days.  4. Persistent atrial fibrillation-patient was on high-dose metoprolol and Eliquis prior to admission.  He was started on IV heparin.  Heparin has been discontinued.  Patient will restart Eliquis.  Continue metoprolol 50 mg p.o. daily.  Heart rate is controlled.  Patient will be discharged home on Eliquis.  5. Hyperlipidemia-continue Lipitor.  6. CAD s/p DES to LAD in  2004, PCI in 2014-continue beta-blocker, statin, aspirin has been discontinued.  7. Chronic respiratory failure-patient has underlying COPD, on 2 L/min  of oxygen at baseline.  Continue Dulera, as needed albuterol.   Procedures:    Consultations:    Discharge Exam: Vitals:   11/21/19 0327 11/21/19 0749  BP: 120/66   Pulse: 80 87  Resp: 18 20  Temp: 98.2 F (36.8 C)   SpO2: 100% 98%    General: Appears in no acute distress Cardiovascular: S1-S2, regular Respiratory: Clear to auscultation bilaterally  Discharge Instructions   Discharge Instructions    Diet - low sodium heart healthy   Complete by: As directed    Increase activity slowly   Complete by: As directed      Allergies as of 11/21/2019   No Known Allergies     Medication List    STOP taking these medications   aspirin 81 MG EC tablet     TAKE these medications   acetaminophen 325 MG tablet Commonly known as: TYLENOL Take 650 mg by mouth every 6 (six) hours as needed for headache.   albuterol 108 (90 Base) MCG/ACT inhaler Commonly known as: VENTOLIN HFA Inhale 2 puffs into the lungs every 6 (six) hours as needed for wheezing or shortness of breath.   apixaban 5 MG Tabs tablet Commonly known as: Eliquis Take 1 tablet (5 mg total) by mouth 2 (two) times daily.   atorvastatin 80 MG tablet Commonly known as: LIPITOR Take 80 mg by mouth daily.   Ensure Take 237 mLs by mouth 2 (two) times daily as needed (nutritional supplement).   ferrous sulfate 325 (65 FE) MG tablet Take 1 tablet (325 mg total) by mouth 2 (two) times daily with a meal.   Fluticasone-Salmeterol 250-50 MCG/DOSE Aepb Commonly known as: Advair Diskus Inhale 1 puff into the lungs 2 (two) times daily for 14 days. What changed:   when to take this  reasons to take this   hydrALAZINE 10 MG tablet Commonly known as: APRESOLINE Take 1 tablet (10 mg total) by mouth 2 (two) times daily.   isosorbide mononitrate 30 MG 24 hr tablet Commonly known as: IMDUR Take 1 tablet (30 mg total) by mouth daily.   metoprolol succinate 50 MG 24 hr tablet Commonly known as: TOPROL-XL Take  1 tablet (50 mg total) by mouth daily. Take with or immediately following a meal. Start taking on: November 22, 2019 What changed: how much to take   nitroGLYCERIN 0.4 MG SL tablet Commonly known as: Nitrostat Place 1 tablet (0.4 mg total) under the tongue every 5 (five) minutes as needed for chest pain (Up to 3 doses).   oxymetazoline 0.05 % nasal spray Commonly known as: AFRIN Place 1 spray into both nostrils 2 (two) times daily as needed for congestion.   pantoprazole 40 MG tablet Commonly known as: PROTONIX Take 1 tablet (40 mg total) by mouth 2 (two) times daily.   potassium chloride SA 20 MEQ tablet Commonly known as: KLOR-CON Take 1 tablet (20 mEq total) by mouth 2 (two) times daily.   torsemide 100 MG tablet Commonly known as: DEMADEX Take 1 tablet (100 mg total) by mouth 2 (two) times daily. What changed:   medication strength  how much to take      No Known Allergies Follow-up Information    [place]. Go on 11/28/2019.   Why: _0 :50am Contact information: [address] [place] [address] [phone]  [facility] Follow up.   Specialty: Home Health Services Why: [place] information: [place] [address] [phone]        Serafina Mitchell, MD Follow up in 3 week(s).   Specialties: Vascular Surgery, Cardiology Why: Office will call Contact information: [place] [address] [phone]            The results of significant diagnostics from this hospitalization (including imaging, microbiology, ancillary and laboratory) are listed below for reference.    Significant Diagnostic Studies: DG Lumbar Spine 2-3 Views  Result Date: 11/04/2019 CLINICAL DATA:  Right lower extremity weakness EXAM: LUMBAR SPINE - 2-3 VIEW COMPARISON:  None. FINDINGS: Sagittal alignment is within normal limits. Vertebral body heights are  grossly maintained. Moderate severe degenerative change at L5-S1. Mild osteophytes at most levels throughout the lumbar spine. Dense aortic atherosclerosis. Ectatic distal abdominal aorta measuring up to 3.2 cm. IMPRESSION: 1. No acute osseous abnormality. 2. Degenerative changes most advanced at L5-S1 3. Dense aortic atherosclerosis. AP diameter of calcified distal abdominal aorta up to 3.2 cm. This may be more thoroughly evaluated with CT, which could be performed on a nonemergent basis unless otherwise clinically indicated. Electronically Signed   By: Donavan Foil M.D.   On: 11/04/2019 17:03   CT Head Wo Contrast  Result Date: 11/04/2019 CLINICAL DATA:  The patient's legs gave way today but the patient did not fall. Initial encounter. EXAM: CT HEAD WITHOUT CONTRAST TECHNIQUE: Contiguous axial images were obtained from the base of the skull through the vertex without intravenous contrast. COMPARISON:  Head CT scan 09/28/2019. FINDINGS: Brain: No evidence of acute infarction, hemorrhage, hydrocephalus, extra-axial collection or mass lesion/mass effect. Atrophy and chronic microvascular ischemic change noted. Vascular: Atherosclerosis. Skull: Intact. No focal lesion. Sinuses/Orbits: Negative. Other: None. IMPRESSION: 1. No acute abnormality. 2. Atrophy and chronic microvascular ischemic change. 3. Atherosclerosis. Electronically Signed   By: Inge Rise M.D.   On: 11/04/2019 14:26   MR BRAIN WO CONTRAST  Result Date: 11/05/2019 CLINICAL DATA:  TIA EXAM: MRI HEAD WITHOUT CONTRAST TECHNIQUE: Multiplanar, multiecho pulse sequences of the brain and surrounding structures were obtained without intravenous contrast. COMPARISON:  CT head 11/04/2019.  MRI head 01/04/2013 FINDINGS: Brain: Negative for acute infarct. Mild to moderate atrophy with mild chronic microvascular ischemic change in the white matter and pons. No cortical infarct. Chronic microhemorrhage right posterior temporal lobe. Otherwise no  hemorrhage or mass. No shift of the midline structures. Vascular: Normal arterial flow voids. Skull and upper cervical spine: No focal skeletal abnormality. Lipoma over the convexity is chronic and benign appearing. Sinuses/Orbits: Mild mucosal edema paranasal sinuses. Negative orbit Other: None IMPRESSION: No acute abnormality. Atrophy and mild chronic microvascular ischemia. Electronically Signed   By: Franchot Gallo M.D.   On: 11/05/2019 18:48   PERIPHERAL VASCULAR CATHETERIZATION  Result Date: 11/19/2019 Patient name: Logan White MRN: [id number] DOB: 05-04-1941 Sex: male 11/19/2019 Pre-operative Diagnosis: Right leg rest pain Post-operative diagnosis:  Same Surgeon:  Annamarie Major Procedure Performed:  1.  Ultrasound-guided access, left femoral artery  2.  Abdominal aortogram with CO2  3.  Bilateral femoral runoff  4.  Conscious sedation 24 minutes  5.  Closure device, Mynx Indications: The patient was admitted with rest pain in the right leg.  He comes in today for angiography Procedure:  The patient was identified in the holding area and taken to room 8.  The patient was then placed supine  on the table and prepped and draped in the usual sterile fashion.  A time out was called.  Conscious sedation was administered with the use of IV fentanyl and Versed under continuous physician and nurse monitoring.  Heart rate, blood pressure, and oxygen saturation were continuously monitored.  Total sedation time was 24 minutes.  Ultrasound was used to evaluate the left common femoral artery.  It was patent .  A digital ultrasound image was acquired.  A micropuncture needle was used to access the left common femoral artery under ultrasound guidance.  An 018 wire was advanced without resistance and a micropuncture sheath was placed.  The 018 wire was removed and a benson wire was placed.  The micropuncture sheath was exchanged for a 5 french sheath.  An omniflush catheter was advanced over the wire to the level of  L-1.  An abdominal angiogram was obtained with CO2.  Next the catheter was pulled down to the aortic bifurcation and additional imaging with CO2 and contrast was performed. Findings:  Aortogram with bifemoral runoff: The infrarenal abdominal aorta is heavily calcified but patent without stenosis.  The right common iliac artery is occluded at its origin.  There is reconstitution of the profundofemoral artery.  The common femoral artery is occluded.  On the left there are greater than 50% lesions within the common and external iliac artery and significant near occlusive disease within the common femoral artery.  Bilateral superficial femoral arteries are occluded.  Intervention: None Impression:  #1  Occluded right iliac system and right common femoral artery  #2 multiple areas of stenosis within the left common and external iliac artery and significant stenosis within the common femoral artery on the left.  #3  The patient will be considered for left iliac stenting with bilateral femoral endarterectomy and left to right femoral-femoral bypass V. Annamarie Major, M.D., [facility] Office: [phone] Pager:  [phone]  DG Chest Portable 1 View  Result Date: 11/15/2019 CLINICAL DATA:  Shortness of breath and bilateral leg swelling for a few days. EXAM: PORTABLE CHEST 1 VIEW COMPARISON:  The single-view of the chest 11/04/2019 and 09/28/2019. FINDINGS: There is cardiomegaly mild interstitial edema. Extensive atherosclerosis. No pneumothorax or pleural fluid. No acute bony abnormality. IMPRESSION: Cardiomegaly and interstitial edema. Atherosclerosis. Electronically Signed   By: Inge Rise M.D.   On: 11/15/2019 13:06   DG Chest Portable 1 View  Result Date: 11/04/2019 CLINICAL DATA:  Right lower extremity weakness and numbness last night for 30 minutes and again today at 10:30 this morning. EXAM: PORTABLE CHEST 1 VIEW COMPARISON:  Single-view of the chest 09/28/2019. PA  and lateral chest 06/22/2019. FINDINGS: There is cardiomegaly and pulmonary edema. Small bilateral pleural effusions. No pneumothorax. Atherosclerosis. IMPRESSION: Congestive heart failure. Atherosclerosis. Electronically Signed   By: Inge Rise M.D.   On: 11/04/2019 12:53   VAS [place] ABI WITH/WO TBI  Result Date: 11/05/2019 LOWER EXTREMITY DOPPLER STUDY Indications: Claudication, and peripheral artery disease.  Limitations: Today's exam was limited due to involuntary patient movement. Comparison Study: AAA ultrasound 03-15-19. Performing Technologist: Baldwin Crown RDMS  Examination Guidelines: A complete evaluation includes at minimum, Doppler waveform signals and systolic blood pressure reading at the level of bilateral brachial, anterior tibial, and posterior tibial arteries, when vessel segments are accessible. Bilateral testing is considered an integral part of a complete examination. Photoelectric Plethysmograph (PPG) waveforms and toe systolic pressure readings are included as required and additional duplex testing as needed. Limited examinations for reoccurring  indications may be performed as noted.  ABI Findings: +---------+------------------+-----+---------+--------+ Right    Rt Pressure (mmHg)IndexWaveform Comment  +---------+------------------+-----+---------+--------+ Brachial 120                    triphasic         +---------+------------------+-----+---------+--------+ PTA                             absent            +---------+------------------+-----+---------+--------+ DP                              absent            +---------+------------------+-----+---------+--------+ Great Toe                       Absent            +---------+------------------+-----+---------+--------+ +---------+------------------+-----+---------+---------------+ Left     Lt Pressure (mmHg)IndexWaveform Comment          +---------+------------------+-----+---------+---------------+ Brachial 122                    triphasic                +---------+------------------+-----+---------+---------------+ PTA      43                0.35          possibly venous +---------+------------------+-----+---------+---------------+ DP                                       possibly venous +---------+------------------+-----+---------+---------------+ Great Toe                       Absent                   +---------+------------------+-----+---------+---------------+  Summary: Right: Unable to innsonate PTA or DP due to low velocity vs nonexistant waveforms. Left: Resting left ankle-brachial index indicates severe left lower extremity arterial disease. Due to irregularity of waveforms unable to distingusihed between arterial vs venous flow.  *See table(s) above for measurements and observations.  Electronically signed by Deitra Mayo MD on 11/05/2019 at 4:42:49 PM.   Final    VAS [place] LOWER EXTREMITY VENOUS (DVT) (MC and WL 7a-7p)  Result Date: 11/16/2019  Lower Venous DVTStudy Indications: Edema, and SOB.  Comparison Study: No prior study Performing Technologist: Maudry Mayhew MHA, RDMS, RVT, RDCS  Examination Guidelines: A complete evaluation includes B-mode imaging, spectral Doppler, color Doppler, and power Doppler as needed of all accessible portions of each vessel. Bilateral testing is considered an integral part of a complete examination. Limited examinations for reoccurring indications may be performed as noted. The reflux portion of the exam is performed with the patient in reverse Trendelenburg.  +---------+---------------+---------+-----------+----------+--------------+ RIGHT    CompressibilityPhasicitySpontaneityPropertiesThrombus Aging +---------+---------------+---------+-----------+----------+--------------+ CFV      Full           Yes      Yes                                  +---------+---------------+---------+-----------+----------+--------------+ SFJ      Full                                                        +---------+---------------+---------+-----------+----------+--------------+  FV Prox  Full                                                        +---------+---------------+---------+-----------+----------+--------------+ FV Mid   Full                                                        +---------+---------------+---------+-----------+----------+--------------+ FV DistalFull                                                        +---------+---------------+---------+-----------+----------+--------------+ PFV      Full                                                        +---------+---------------+---------+-----------+----------+--------------+ POP      Full           Yes      Yes                                 +---------+---------------+---------+-----------+----------+--------------+ PTV      Full                                                        +---------+---------------+---------+-----------+----------+--------------+ PERO     Full                                                        +---------+---------------+---------+-----------+----------+--------------+   +---------+---------------+---------+-----------+----------+--------------+ LEFT     CompressibilityPhasicitySpontaneityPropertiesThrombus Aging +---------+---------------+---------+-----------+----------+--------------+ CFV      Full           Yes      Yes                                 +---------+---------------+---------+-----------+----------+--------------+ SFJ      Full                                                        +---------+---------------+---------+-----------+----------+--------------+ FV Prox  Full                                                         +---------+---------------+---------+-----------+----------+--------------+  FV Mid   Full                                                        +---------+---------------+---------+-----------+----------+--------------+ FV DistalFull                                                        +---------+---------------+---------+-----------+----------+--------------+ PFV      Full                                                        +---------+---------------+---------+-----------+----------+--------------+ POP      Full           Yes      Yes                                 +---------+---------------+---------+-----------+----------+--------------+ PTV      Full                                                        +---------+---------------+---------+-----------+----------+--------------+ PERO     Full                                                        +---------+---------------+---------+-----------+----------+--------------+     Summary: RIGHT: - There is no evidence of deep vein thrombosis in the lower extremity.  - No cystic structure found in the popliteal fossa. - Ultrasound characteristics of enlarged lymph nodes are noted in the groin.  LEFT: - There is no evidence of deep vein thrombosis in the lower extremity.  - No cystic structure found in the popliteal fossa.  Pulstaile venous flow is noted bilaterally, suggestive of possible elevated right heart pressure. *See table(s) above for measurements and observations. Electronically signed by Harold Barban MD on 11/16/2019 at 56:07:56 PM.    Final     Microbiology: Recent Results (from the past 240 hour(s))  SARS CORONAVIRUS 2 (TAT 6-24 HRS) Nasopharyngeal Nasopharyngeal Swab     Status: None   Collection Time: 11/15/19  2:18 PM   Specimen: Nasopharyngeal Swab  Result Value Ref Range Status   SARS Coronavirus 2 NEGATIVE NEGATIVE Final    Comment: (NOTE) SARS-CoV-2 target nucleic acids are NOT  DETECTED. The SARS-CoV-2 RNA is generally detectable in upper and lower respiratory specimens during the acute phase of infection. Negative results do not preclude SARS-CoV-2 infection, do not rule out co-infections with other pathogens, and should not be used as the sole basis for treatment or other patient management decisions. Negative results must be combined with clinical observations, patient history, and epidemiological information. The expected result is  Negative. Fact Sheet for Patients: SugarRoll.be Fact Sheet for Healthcare Providers: https://www.woods-mathews.com/ This test is not yet approved or cleared by the [place] FDA and  has been authorized for detection and/or diagnosis of SARS-CoV-2 by FDA under an Emergency Use Authorization (EUA). This EUA will remain  in effect (meaning this test can be used) for the duration of the COVID-19 declaration under Section 56 4(b)(1) of the Act, 21 U.S.C. section 360bbb-3(b)(1), unless the authorization is terminated or revoked sooner. Performed at [facility], [place] [address] [place], [place] [address]      Labs: Basic Metabolic Panel: Recent Labs  Lab 11/17/19 0448 11/18/19 0522 11/19/19 0306 11/20/19 0511 11/21/19 0704  NA 134* 138 137 137 135  K 4.0 4.2 4.5 4.4 4.6  CL 100 100 101 100 97*  CO2 20* _0 GLUCOSE 128* 100* 121* 139* 109*  BUN 50* 44* 43* 42* 41*  CREATININE 2.43* 2.34* 2.32* 2.05* 1.97*  CALCIUM 8.8* 9.3 9.0 8.9 8.9  MG 2.1 2.1 2.1 2.0 2.1  PHOS 4.3 3.7 3.4 3.0 3.6   Liver Function Tests: Recent Labs  Lab 11/17/19 0448 11/18/19 0522 11/19/19 0306 11/20/19 0511 11/21/19 0704  ALBUMIN 3.0* 3.2* 2.9* 2.9* 2.9*   No results for input(s): LIPASE, AMYLASE in the last 168 hours. No results for input(s): AMMONIA in the last 168 hours. CBC: Recent Labs  Lab 11/15/19 2333 11/15/19 2333 11/16/19 0256 11/17/19 0448 11/18/19 0522  11/19/19 0306 11/21/19 0704  WBC 8.4   < > 8.2 8.9 9.2 8.7 7.2  NEUTROABS 6.0  --   --   --   --   --   --   HGB 9.1*   < > 9.0* 9.4* 9.6* 9.1* 8.8*  HCT 27.5*   < > 27.0* 28.0* 29.2* 27.6* 26.6*  MCV 76.6*   < > 76.9* 76.3* 77.5* 77.7* 77.6*  PLT 261   < > 254 280 263 255 238   < > = values in this interval not displayed.   Cardiac Enzymes: No results for input(s): CKTOTAL, CKMB, CKMBINDEX, TROPONINI in the last 168 hours. BNP: BNP (last 3 results) Recent Labs    09/28/19 1829 11/04/19 1241 11/15/19 1110  BNP 2,423.4* 1,655.3* 2,374.7*    ProBNP (last 3 results) Recent Labs    08/15/19 0949  PROBNP 16,511*    CBG: No results for input(s): GLUCAP in the last 168 hours.     Signed:  Oswald Hillock MD.  [facility] 11/21/2019, 10:57 AM

## 2019-11-21 NOTE — Progress Notes (Signed)
  Progress Note    11/21/2019 9:47 AM 2 Days Post-Op  Subjective:  No lower extremity discomfort over night   Vitals:   11/21/19 0327 11/21/19 0749  BP: 120/66   Pulse: 80 87  Resp: 18 20  Temp: 98.2 F (36.8 C)   SpO2: 100% 98%   Physical Exam: General: well appearing, well nourished, appears comfortable Lungs:  Wheezing, non labored Extremities:  Left femoral access site, clean, dry intact. No hematoma or ecchymosis. Bilateral lower extremities warm and well perfused. Motor and sensory intact Neurologic: Alert and oriented  CBC    Component Value Date/Time   WBC 7.2 11/21/2019 0704   RBC 3.43 (L) 11/21/2019 0704   HGB 8.8 (L) 11/21/2019 0704   HGB 12.7 (L) 07/29/2019 1004   HCT 26.6 (L) 11/21/2019 0704   HCT 38.3 07/29/2019 1004   PLT 238 11/21/2019 0704   PLT 191 07/29/2019 1004   MCV 77.6 (L) 11/21/2019 0704   MCV 80 07/29/2019 1004   MCH 25.7 (L) 11/21/2019 0704   MCHC 33.1 11/21/2019 0704   RDW 20.6 (H) 11/21/2019 0704   RDW 15.6 (H) 07/29/2019 1004   LYMPHSABS 1.3 11/15/2019 2333   MONOABS 0.9 11/15/2019 2333   EOSABS 0.1 11/15/2019 2333   BASOSABS 0.0 11/15/2019 2333    BMET    Component Value Date/Time   NA 135 11/21/2019 0704   NA 139 10/29/2019 1139   K 4.6 11/21/2019 0704   CL 97 (L) 11/21/2019 0704   CO2 26 11/21/2019 0704   GLUCOSE 109 (H) 11/21/2019 0704   BUN 41 (H) 11/21/2019 0704   BUN 37 (H) 10/29/2019 1139   CREATININE 1.97 (H) 11/21/2019 0704   CREATININE 1.34 (H) 10/31/2016 0844   CALCIUM 8.9 11/21/2019 0704   GFRNONAA 32 (L) 11/21/2019 0704   GFRNONAA 46 (L) 10/24/2014 0943   GFRAA 37 (L) 11/21/2019 0704   GFRAA 53 (L) 10/24/2014 0943    INR    Component Value Date/Time   INR 2.5 (H) 09/30/2019 1754     Intake/Output Summary (Last 24 hours) at 11/21/2019 0947 Last data filed at 11/21/2019 0826 Gross per 24 hour  Intake 2088.59 ml  Output 2126 ml  Net -37.41 ml     Assessment/Plan:  79 y.o. male is s/p Abdominal  aortogram, left femoral access with bilateral femoral runoff for bilateral lower extremity ischemia  2 Days Post-Op. Patient is doing well with improved lower extremity pain. He is eager to go home today. He will go home on Eliquis, Aspirin, and Statin. He has follow up appointment in 2 weeks with Dr. Trula Slade to discuss revascularization options   Karoline Caldwell, PA-C [facility] [phone] 11/21/2019 9:47 AM

## 2019-11-21 NOTE — Progress Notes (Signed)
Discharge noted- agree. Has had longstanding CKD. Will arrange CKD followup in our clinic.  Will be called with appt

## 2019-11-21 NOTE — Discharge Instructions (Signed)

## 2019-11-21 NOTE — Progress Notes (Signed)
Progress Note  Patient Name: Logan White Date of Encounter: 11/21/2019  Primary Cardiologist: Kirk Ruths, MD   Subjective   We accelerated his diuretic protocol to allow him to leave the hospital and see his grandson, flying in from [place]. Excellent diuresis overnight, -2 L.  Despite this reportedly his weight has gone up 2 pounds, doubt accuracy. Creatinine has actually improved to 1.97, normal potassium.  Hemoglobin stable at 8.8  Inpatient Medications    Scheduled Meds: . aspirin EC  81 mg Oral Daily  . atorvastatin  80 mg Oral Daily  . feeding supplement (ENSURE ENLIVE)  237 mL Oral TID BM  . furosemide  80 mg Intravenous Q12H  . metoprolol succinate  50 mg Oral Daily  . mometasone-formoterol  2 puff Inhalation BID  . multivitamin with minerals  1 tablet Oral Daily  . pantoprazole  40 mg Oral BID  . potassium chloride SA  20 mEq Oral BID  . sodium chloride flush  3 mL Intravenous Q12H  . sodium chloride flush  3 mL Intravenous Q12H   Continuous Infusions: . sodium chloride    . sodium chloride    . heparin 1,350 Units/hr (11/21/19 0600)   PRN Meds: sodium chloride, sodium chloride, acetaminophen, acetaminophen, albuterol, fentaNYL (SUBLIMAZE) injection, hydrALAZINE, labetalol, ondansetron (ZOFRAN) IV, ondansetron (ZOFRAN) IV, sodium chloride flush, sodium chloride flush   Vital Signs    Vitals:   11/20/19 1946 11/21/19 0327 11/21/19 0327 11/21/19 0749  BP:   120/66   Pulse:   80 87  Resp:   18 20  Temp:   98.2 F (36.8 C)   TempSrc:   Oral   SpO2: 93%  100% 98%  Weight:  91.1 kg    Height:        Intake/Output Summary (Last 24 hours) at 11/21/2019 0907 Last data filed at 11/21/2019 0826 Gross per 24 hour  Intake 2088.59 ml  Output 2126 ml  Net -37.41 ml   Last 3 Weights 11/21/2019 11/20/2019 11/19/2019  Weight (lbs) 200 lb 12.8 oz 198 lb 14.4 oz 196 lb 11.2 oz  Weight (kg) 91.082 kg 90.22 kg 89.223 kg  Some encounter information is  confidential and restricted. Go to Review Flowsheets activity to see all data.      Telemetry    Atrial fibrillation with excellent rate control, average ventricular rate in the 80s, frequent monomorphic PVCs, no episodes of VT - Personally Reviewed  ECG    No new tracings- Personally Reviewed  Physical Exam  Lean, comfortable GEN: No acute distress.   Neck: No JVD Cardiac: RRR, no murmurs, rubs, or gallops.  Respiratory: Clear to auscultation bilaterally. GI: Soft, nontender, non-distended  MS: No edema; No deformity. Neuro:  Nonfocal  Psych: Normal affect   Labs    High Sensitivity Troponin:   Recent Labs  Lab 11/15/19 1318 11/15/19 1430  TROPONINIHS 7 8      Chemistry Recent Labs  Lab 11/19/19 0306 11/20/19 0511 11/21/19 0704  NA 137 137 135  K 4.5 4.4 4.6  CL 101 100 97*  CO2 26 24 26   GLUCOSE 121* 139* 109*  BUN 43* 42* 41*  CREATININE 2.32* 2.05* 1.97*  CALCIUM 9.0 8.9 8.9  ALBUMIN 2.9* 2.9* 2.9*  GFRNONAA 26* 31* 32*  GFRAA 30* 35* 37*  ANIONGAP 10 13 12      Hematology Recent Labs  Lab 11/18/19 0522 11/19/19 0306 11/21/19 0704  WBC 9.2 8.7 7.2  RBC 3.77* 3.55* 3.43*  HGB 9.6*  9.1* 8.8*  HCT 29.2* 27.6* 26.6*  MCV 77.5* 77.7* 77.6*  MCH 25.5* 25.6* 25.7*  MCHC 32.9 33.0 33.1  RDW 21.1* 20.9* 20.6*  PLT 263 255 238    BNP Recent Labs  Lab 11/15/19 1110  BNP 2,374.7*     DDimer No results for input(s): DDIMER in the last 168 hours.   Radiology    No results found.  Cardiac Studies   Relevant CV Studies: RHC:10/02/2019 Conclusion  Moderate pulmonary hypertension with mean PA pressure of 42 mmHg.  Mean pulmonary capillary wedge pressure 28 mmHg.  Phasic pulmonary artery pressure 63/29 mmHg, mean RA pressure 20 mmHg, and pulmonary artery pulsatility index is 1.7 (suggesting RV dysfunction)  Mixed venous O2 saturation 57%   ECHO: 09/29/19 1. Left ventricular ejection fraction, by visual estimation, is 25 to  30%. The  left ventricle has severely decreased function. There is mildly  increased left ventricular hypertrophy.  2. Left ventricular diastolic function could not be evaluated.  3. Severely dilated left ventricular internal cavity size.  4. The left ventricle demonstrates global hypokinesis.  5. Global right ventricle has severely reduced systolic function.The  right ventricular size is mildly enlarged.  6. Left atrial size was severely dilated.  7. The interatrial septum is aneurysmal.  8. Right atrial size was severely dilated.  9. The mitral valve is normal in structure. Moderate mitral valve  regurgitation. No evidence of mitral stenosis.  10. The tricuspid valve is normal in structure. Tricuspid valve  regurgitation is mild.  11. The aortic valve is tricuspid. Aortic valve regurgitation is mild.  Mild to moderate aortic valve sclerosis/calcification without any evidence  of aortic stenosis.  12. The pulmonic valve was normal in structure. Pulmonic valve  regurgitation is mild.  13. Moderately elevated pulmonary artery systolic pressure.  14. The inferior vena cava is dilated in size with <50% respiratory  variability, suggesting right atrial pressure of 15 mmHg.  15. Severe global reduction in LV systolic function; mild LVH; 4 chamber  enlargement; severe RV dysfunction; mild AI; moderate MR; mild TR;  moderate pulmonary hypertension.     Patient Profile     79 y.o. male with a history of permanent atrial fibrillation, extensive CAD, history of V. fib arrest, ischemic cardiomyopathy with severely depressed left ventricular systolic function and severe biventricular systolic failure, chronic respiratory failure with hypoxia on home oxygen 2 L, COPD CKD 3B, severe PAD, iron deficiency anemia  Assessment & Plan    1. CHF:  Substantial net diuresis in last 24 hours.  Probably not yet at euvolemic state, but clearly improved.  He is insistent on going home to meet his family flying  [place] from [place].  Unable to use RAAS inhibitors.    On metoprolol succinate, but on a lower dose than previously prescribed.  Resume hydralazine/nitrates.  We will honor our agreement to let him go home today.  He has a follow-up appoint with Dr. [name] already scheduled on March 22. 2. AFib: Rate controlled. On IV heparin.  I would not recommend warfarin.  He should be on DOAC, preferably Eliquis due to his history of GI bleeding.  Note erosive gastropathy on EGD earlier this month, colonoscopy with diverticulosis. 3. COPD/chronic respiratory failure:  On chronic home O2 2 L/minute 4. PAD:  To have a follow-up discussion with Dr. Trula Slade in a couple of weeks regarding revascularization.  He requires hemodynamic "tune up" before he can undergo any surgical procedure. 5. CKD 3b:  Creatinine has remained steady  even slightly improved.  I think we are beyond the time range where we would expect to see the for signs of contrast nephrotoxicity. 6.  Iron deficiency anemia: Recent GI bleeding from erosive gastropathy.  Should not be on aspirin at least for the time being.  [facility] will sign off.   Medication Recommendations: Atorvastatin 80 mg once daily, metoprolol succinate 50 mg once daily, isosorbide mononitrate 30 mg once daily, hydralazine 10 mg twice daily, torsemide 100 mg twice daily, potassium chloride 20 mEq twice daily.  Stop aspirin. Other recommendations (labs, testing, etc): Can have repeat basic metabolic panel on 9/14 Follow up as an outpatient: Has an appointment with Dr. [name] on 3/22.  For questions or updates, please contact [place] Please consult [email or web address] for contact info under        Signed, Sanda Klein, MD  11/21/2019, 9:07 AM

## 2019-11-21 NOTE — TOC Progression Note (Signed)
Transition of Care [facility]) - Progression Note    Patient Details  Name: Logan White MRN: [id number] Date of Birth: 26-Jul-1941  Transition of Care [facility]) CM/SW Contact  Zenon Mayo, RN Phone Number: 11/21/2019, 12:19 PM  Clinical Narrative:    NCM notified [place] with [facility] that patient is for dc today.    Expected Discharge Plan: [place] Barriers to Discharge: No Barriers Identified  Expected Discharge Plan and Services Expected Discharge Plan: [place] In-house Referral: NA Discharge Planning Services: CM Consult Post Acute Care Choice: Resumption of Svcs/PTA Provider Living arrangements for the past 2 months: Single Family Home Expected Discharge Date: 11/21/19               DME Arranged: (NA)         HH Arranged: RN, Disease Management [place] Agency: [facility] Date [place]: 11/18/19 Time [place]: 1004 Representative spoke with at [place]: [place]) Interventions    Readmission Risk Interventions Readmission Risk Prevention Plan 11/18/2019 09/30/2019  Transportation Screening Complete Complete  PCP or Specialist Appt within 3-5 Days - Complete  HRI or [place] - Complete  Social Work Consult for [place] Planning/Counseling - Complete  Palliative Care Screening - Not Applicable  Medication Review Press photographer) Complete Complete  PCP or Specialist appointment within 3-5 days of discharge Complete -  [place] or [place] Complete -  SW Recovery Care/Counseling Consult Complete -  Palliative Care Screening Complete -  [place] Not Applicable -  Some recent data might be hidden

## 2019-11-21 NOTE — Progress Notes (Signed)
ANTICOAGULATION CONSULT NOTE - Follow Up Consult  Pharmacy Consult for heparin Indication: Afib/PVD  Labs: Recent Labs    11/18/19 0522 11/18/19 0522 11/19/19 0306 11/19/19 1713 11/20/19 0511 11/20/19 1355 11/20/19 2340  HGB 9.6*  --  9.1*  --   --   --   --   HCT 29.2*  --  27.6*  --   --   --   --   PLT 263  --  255  --   --   --   --   HEPARINUNFRC 0.30   < > 0.29*   < > 0.12* 0.69 0.71*  CREATININE 2.34*  --  2.32*  --  2.05*  --   --    < > = values in this interval not displayed.    Assessment: 79yo male now slightly supratherapeutic on heparin despite rate change.  Goal of Therapy:  Heparin level 0.3-0.7 units/ml   Plan:  Will decrease heparin gtt by 1 unit/kg/hr to 1350 units/hr and check level in 8 hours.    Wynona Neat, PharmD, BCPS  11/21/2019,12:37 AM

## 2019-11-22 ENCOUNTER — Other Ambulatory Visit: Payer: Self-pay | Admitting: *Deleted

## 2019-11-22 NOTE — Patient Outreach (Signed)
[facility]) Care Management  11/22/2019  Logan White [birth year] [id number]   Telephone Screening for Discharge transition of care needs.   Referral Received: 11/22/19 Referral Reason : Notification of discharge of inpatient discharge from [place] : [employer]    Subjective: Initial outreach call to patient, no answer messaged received states mailbox is full unable to leave a message at this time.    Plan Will plan return call in the next 4 business days. Will send unsuccessful outreach letter.    Joylene Draft, RN, BSN  [place] Management Coordinator  [phone]- Mobile [phone]- Toll Free Main Office

## 2019-11-23 DIAGNOSIS — E1151 Type 2 diabetes mellitus with diabetic peripheral angiopathy without gangrene: Secondary | ICD-10-CM | POA: Diagnosis not present

## 2019-11-23 DIAGNOSIS — N184 Chronic kidney disease, stage 4 (severe): Secondary | ICD-10-CM | POA: Diagnosis not present

## 2019-11-23 DIAGNOSIS — I4819 Other persistent atrial fibrillation: Secondary | ICD-10-CM | POA: Diagnosis not present

## 2019-11-23 DIAGNOSIS — I5082 Biventricular heart failure: Secondary | ICD-10-CM | POA: Diagnosis not present

## 2019-11-23 DIAGNOSIS — I5022 Chronic systolic (congestive) heart failure: Secondary | ICD-10-CM | POA: Diagnosis not present

## 2019-11-23 DIAGNOSIS — T82858D Stenosis of vascular prosthetic devices, implants and grafts, subsequent encounter: Secondary | ICD-10-CM | POA: Diagnosis not present

## 2019-11-23 DIAGNOSIS — I130 Hypertensive heart and chronic kidney disease with heart failure and stage 1 through stage 4 chronic kidney disease, or unspecified chronic kidney disease: Secondary | ICD-10-CM | POA: Diagnosis not present

## 2019-11-23 DIAGNOSIS — E1122 Type 2 diabetes mellitus with diabetic chronic kidney disease: Secondary | ICD-10-CM | POA: Diagnosis not present

## 2019-11-23 DIAGNOSIS — D631 Anemia in chronic kidney disease: Secondary | ICD-10-CM | POA: Diagnosis not present

## 2019-11-24 ENCOUNTER — Other Ambulatory Visit: Payer: Self-pay | Admitting: Cardiology

## 2019-11-25 ENCOUNTER — Encounter: Payer: Self-pay | Admitting: Cardiology

## 2019-11-25 ENCOUNTER — Other Ambulatory Visit: Payer: Self-pay

## 2019-11-25 ENCOUNTER — Inpatient Hospital Stay (HOSPITAL_COMMUNITY)
Admission: EM | Admit: 2019-11-25 | Discharge: 2019-12-05 | DRG: 286 | Disposition: A | Discharge status: Home Health | Payer: Medicare HMO | Source: Ambulatory Visit | Attending: Internal Medicine | Admitting: Internal Medicine

## 2019-11-25 ENCOUNTER — Encounter (HOSPITAL_COMMUNITY): Payer: Self-pay | Admitting: Emergency Medicine

## 2019-11-25 ENCOUNTER — Encounter: Payer: Self-pay | Admitting: Physician Assistant

## 2019-11-25 ENCOUNTER — Ambulatory Visit (INDEPENDENT_AMBULATORY_CARE_PROVIDER_SITE_OTHER): Payer: Medicare HMO | Admitting: Cardiology

## 2019-11-25 ENCOUNTER — Other Ambulatory Visit: Payer: Self-pay | Admitting: *Deleted

## 2019-11-25 ENCOUNTER — Inpatient Hospital Stay (HOSPITAL_COMMUNITY): Payer: Medicare HMO

## 2019-11-25 VITALS — BP 115/64 | HR 86 | Ht 75.5 in | Wt 203.8 lb

## 2019-11-25 DIAGNOSIS — I743 Embolism and thrombosis of arteries of the lower extremities: Secondary | ICD-10-CM | POA: Diagnosis present

## 2019-11-25 DIAGNOSIS — I272 Pulmonary hypertension, unspecified: Secondary | ICD-10-CM | POA: Diagnosis present

## 2019-11-25 DIAGNOSIS — I083 Combined rheumatic disorders of mitral, aortic and tricuspid valves: Secondary | ICD-10-CM | POA: Diagnosis present

## 2019-11-25 DIAGNOSIS — Z7951 Long term (current) use of inhaled steroids: Secondary | ICD-10-CM

## 2019-11-25 DIAGNOSIS — Z806 Family history of leukemia: Secondary | ICD-10-CM

## 2019-11-25 DIAGNOSIS — I130 Hypertensive heart and chronic kidney disease with heart failure and stage 1 through stage 4 chronic kidney disease, or unspecified chronic kidney disease: Secondary | ICD-10-CM | POA: Diagnosis not present

## 2019-11-25 DIAGNOSIS — B9620 Unspecified Escherichia coli [E. coli] as the cause of diseases classified elsewhere: Secondary | ICD-10-CM | POA: Diagnosis not present

## 2019-11-25 DIAGNOSIS — E876 Hypokalemia: Secondary | ICD-10-CM | POA: Diagnosis not present

## 2019-11-25 DIAGNOSIS — N179 Acute kidney failure, unspecified: Secondary | ICD-10-CM | POA: Diagnosis not present

## 2019-11-25 DIAGNOSIS — Z8674 Personal history of sudden cardiac arrest: Secondary | ICD-10-CM | POA: Diagnosis not present

## 2019-11-25 DIAGNOSIS — I5082 Biventricular heart failure: Secondary | ICD-10-CM | POA: Diagnosis not present

## 2019-11-25 DIAGNOSIS — I5021 Acute systolic (congestive) heart failure: Secondary | ICD-10-CM | POA: Diagnosis not present

## 2019-11-25 DIAGNOSIS — D509 Iron deficiency anemia, unspecified: Secondary | ICD-10-CM | POA: Diagnosis not present

## 2019-11-25 DIAGNOSIS — I5043 Acute on chronic combined systolic (congestive) and diastolic (congestive) heart failure: Secondary | ICD-10-CM | POA: Diagnosis not present

## 2019-11-25 DIAGNOSIS — Y92230 Patient room in hospital as the place of occurrence of the external cause: Secondary | ICD-10-CM | POA: Diagnosis not present

## 2019-11-25 DIAGNOSIS — I252 Old myocardial infarction: Secondary | ICD-10-CM | POA: Diagnosis not present

## 2019-11-25 DIAGNOSIS — N1832 Chronic kidney disease, stage 3b: Secondary | ICD-10-CM | POA: Diagnosis not present

## 2019-11-25 DIAGNOSIS — N5089 Other specified disorders of the male genital organs: Secondary | ICD-10-CM | POA: Diagnosis not present

## 2019-11-25 DIAGNOSIS — I132 Hypertensive heart and chronic kidney disease with heart failure and with stage 5 chronic kidney disease, or end stage renal disease: Secondary | ICD-10-CM | POA: Diagnosis not present

## 2019-11-25 DIAGNOSIS — I4891 Unspecified atrial fibrillation: Secondary | ICD-10-CM | POA: Diagnosis not present

## 2019-11-25 DIAGNOSIS — I6523 Occlusion and stenosis of bilateral carotid arteries: Secondary | ICD-10-CM | POA: Diagnosis present

## 2019-11-25 DIAGNOSIS — I491 Atrial premature depolarization: Secondary | ICD-10-CM | POA: Diagnosis not present

## 2019-11-25 DIAGNOSIS — T8384XA Pain from genitourinary prosthetic devices, implants and grafts, initial encounter: Secondary | ICD-10-CM | POA: Diagnosis not present

## 2019-11-25 DIAGNOSIS — I714 Abdominal aortic aneurysm, without rupture: Secondary | ICD-10-CM | POA: Diagnosis present

## 2019-11-25 DIAGNOSIS — I745 Embolism and thrombosis of iliac artery: Secondary | ICD-10-CM | POA: Diagnosis not present

## 2019-11-25 DIAGNOSIS — Z8249 Family history of ischemic heart disease and other diseases of the circulatory system: Secondary | ICD-10-CM

## 2019-11-25 DIAGNOSIS — R0602 Shortness of breath: Secondary | ICD-10-CM | POA: Diagnosis not present

## 2019-11-25 DIAGNOSIS — Z8719 Personal history of other diseases of the digestive system: Secondary | ICD-10-CM

## 2019-11-25 DIAGNOSIS — R0689 Other abnormalities of breathing: Secondary | ICD-10-CM | POA: Diagnosis not present

## 2019-11-25 DIAGNOSIS — Y846 Urinary catheterization as the cause of abnormal reaction of the patient, or of later complication, without mention of misadventure at the time of the procedure: Secondary | ICD-10-CM | POA: Diagnosis not present

## 2019-11-25 DIAGNOSIS — K5730 Diverticulosis of large intestine without perforation or abscess without bleeding: Secondary | ICD-10-CM | POA: Diagnosis present

## 2019-11-25 DIAGNOSIS — Z8673 Personal history of transient ischemic attack (TIA), and cerebral infarction without residual deficits: Secondary | ICD-10-CM

## 2019-11-25 DIAGNOSIS — R600 Localized edema: Secondary | ICD-10-CM | POA: Diagnosis not present

## 2019-11-25 DIAGNOSIS — R062 Wheezing: Secondary | ICD-10-CM | POA: Diagnosis not present

## 2019-11-25 DIAGNOSIS — R0902 Hypoxemia: Secondary | ICD-10-CM | POA: Diagnosis not present

## 2019-11-25 DIAGNOSIS — I739 Peripheral vascular disease, unspecified: Secondary | ICD-10-CM | POA: Diagnosis not present

## 2019-11-25 DIAGNOSIS — E1122 Type 2 diabetes mellitus with diabetic chronic kidney disease: Secondary | ICD-10-CM | POA: Diagnosis present

## 2019-11-25 DIAGNOSIS — E782 Mixed hyperlipidemia: Secondary | ICD-10-CM | POA: Diagnosis present

## 2019-11-25 DIAGNOSIS — Z79899 Other long term (current) drug therapy: Secondary | ICD-10-CM

## 2019-11-25 DIAGNOSIS — E875 Hyperkalemia: Secondary | ICD-10-CM | POA: Diagnosis present

## 2019-11-25 DIAGNOSIS — N189 Chronic kidney disease, unspecified: Secondary | ICD-10-CM | POA: Diagnosis present

## 2019-11-25 DIAGNOSIS — Z8619 Personal history of other infectious and parasitic diseases: Secondary | ICD-10-CM

## 2019-11-25 DIAGNOSIS — I2510 Atherosclerotic heart disease of native coronary artery without angina pectoris: Secondary | ICD-10-CM | POA: Diagnosis not present

## 2019-11-25 DIAGNOSIS — I70221 Atherosclerosis of native arteries of extremities with rest pain, right leg: Secondary | ICD-10-CM | POA: Diagnosis not present

## 2019-11-25 DIAGNOSIS — I5023 Acute on chronic systolic (congestive) heart failure: Secondary | ICD-10-CM | POA: Diagnosis not present

## 2019-11-25 DIAGNOSIS — Z87891 Personal history of nicotine dependence: Secondary | ICD-10-CM

## 2019-11-25 DIAGNOSIS — I255 Ischemic cardiomyopathy: Secondary | ICD-10-CM | POA: Diagnosis present

## 2019-11-25 DIAGNOSIS — I509 Heart failure, unspecified: Secondary | ICD-10-CM | POA: Diagnosis not present

## 2019-11-25 DIAGNOSIS — Z20822 Contact with and (suspected) exposure to covid-19: Secondary | ICD-10-CM | POA: Diagnosis not present

## 2019-11-25 DIAGNOSIS — N390 Urinary tract infection, site not specified: Secondary | ICD-10-CM | POA: Diagnosis not present

## 2019-11-25 DIAGNOSIS — I4821 Permanent atrial fibrillation: Secondary | ICD-10-CM | POA: Diagnosis present

## 2019-11-25 DIAGNOSIS — Z808 Family history of malignant neoplasm of other organs or systems: Secondary | ICD-10-CM

## 2019-11-25 DIAGNOSIS — I4581 Long QT syndrome: Secondary | ICD-10-CM | POA: Diagnosis present

## 2019-11-25 DIAGNOSIS — E1151 Type 2 diabetes mellitus with diabetic peripheral angiopathy without gangrene: Secondary | ICD-10-CM | POA: Diagnosis present

## 2019-11-25 DIAGNOSIS — Z955 Presence of coronary angioplasty implant and graft: Secondary | ICD-10-CM

## 2019-11-25 DIAGNOSIS — K2970 Gastritis, unspecified, without bleeding: Secondary | ICD-10-CM | POA: Diagnosis present

## 2019-11-25 DIAGNOSIS — R0603 Acute respiratory distress: Secondary | ICD-10-CM | POA: Diagnosis present

## 2019-11-25 DIAGNOSIS — Z7901 Long term (current) use of anticoagulants: Secondary | ICD-10-CM

## 2019-11-25 DIAGNOSIS — Z95820 Peripheral vascular angioplasty status with implants and grafts: Secondary | ICD-10-CM

## 2019-11-25 DIAGNOSIS — Z832 Family history of diseases of the blood and blood-forming organs and certain disorders involving the immune mechanism: Secondary | ICD-10-CM

## 2019-11-25 DIAGNOSIS — E1129 Type 2 diabetes mellitus with other diabetic kidney complication: Secondary | ICD-10-CM | POA: Diagnosis not present

## 2019-11-25 DIAGNOSIS — Z86010 Personal history of colonic polyps: Secondary | ICD-10-CM

## 2019-11-25 LAB — CBC WITH DIFFERENTIAL/PLATELET
Abs Immature Granulocytes: 0.02 10*3/uL (ref 0.00–0.07)
Basophils Absolute: 0.1 10*3/uL (ref 0.0–0.1)
Basophils Relative: 1 %
Eosinophils Absolute: 0.1 10*3/uL (ref 0.0–0.5)
Eosinophils Relative: 2 %
HCT: 30.1 % — ABNORMAL LOW (ref 39.0–52.0)
Hemoglobin: 9.7 g/dL — ABNORMAL LOW (ref 13.0–17.0)
Immature Granulocytes: 0 %
Lymphocytes Relative: 20 %
Lymphs Abs: 1.4 10*3/uL (ref 0.7–4.0)
MCH: 25.1 pg — ABNORMAL LOW (ref 26.0–34.0)
MCHC: 32.2 g/dL (ref 30.0–36.0)
MCV: 78 fL — ABNORMAL LOW (ref 80.0–100.0)
Monocytes Absolute: 1 10*3/uL (ref 0.1–1.0)
Monocytes Relative: 14 %
Neutro Abs: 4.5 10*3/uL (ref 1.7–7.7)
Neutrophils Relative %: 63 %
Platelets: 313 10*3/uL (ref 150–400)
RBC: 3.86 MIL/uL — ABNORMAL LOW (ref 4.22–5.81)
RDW: 21.6 % — ABNORMAL HIGH (ref 11.5–15.5)
WBC: 7 10*3/uL (ref 4.0–10.5)
nRBC: 0.4 % — ABNORMAL HIGH (ref 0.0–0.2)

## 2019-11-25 LAB — COMPREHENSIVE METABOLIC PANEL
ALT: 23 U/L (ref 0–44)
AST: 37 U/L (ref 15–41)
Albumin: 3.3 g/dL — ABNORMAL LOW (ref 3.5–5.0)
Alkaline Phosphatase: 139 U/L — ABNORMAL HIGH (ref 38–126)
Anion gap: 12 (ref 5–15)
BUN: 72 mg/dL — ABNORMAL HIGH (ref 8–23)
CO2: 23 mmol/L (ref 22–32)
Calcium: 8.9 mg/dL (ref 8.9–10.3)
Chloride: 99 mmol/L (ref 98–111)
Creatinine, Ser: 3.4 mg/dL — ABNORMAL HIGH (ref 0.61–1.24)
GFR calc Af Amer: 19 mL/min — ABNORMAL LOW (ref >60)
GFR calc non Af Amer: 17 mL/min — ABNORMAL LOW (ref >60)
Glucose, Bld: 111 mg/dL — ABNORMAL HIGH (ref 70–99)
Potassium: 5.2 mmol/L — ABNORMAL HIGH (ref 3.5–5.1)
Sodium: 134 mmol/L — ABNORMAL LOW (ref 135–145)
Total Bilirubin: 1.9 mg/dL — ABNORMAL HIGH (ref 0.3–1.2)
Total Protein: 6.9 g/dL (ref 6.5–8.1)

## 2019-11-25 LAB — BRAIN NATRIURETIC PEPTIDE: B Natriuretic Peptide: 2757.9 pg/mL — ABNORMAL HIGH (ref 0.0–100.0)

## 2019-11-25 LAB — URINALYSIS, ROUTINE W REFLEX MICROSCOPIC
Bilirubin Urine: NEGATIVE
Glucose, UA: NEGATIVE mg/dL
Hgb urine dipstick: NEGATIVE
Ketones, ur: NEGATIVE mg/dL
Leukocytes,Ua: NEGATIVE
Nitrite: NEGATIVE
Protein, ur: NEGATIVE mg/dL
Specific Gravity, Urine: 1.009 (ref 1.005–1.030)
pH: 5 (ref 5.0–8.0)

## 2019-11-25 LAB — PROTIME-INR
INR: 2.5 — ABNORMAL HIGH (ref 0.8–1.2)
Prothrombin Time: 26.9 seconds — ABNORMAL HIGH (ref 11.4–15.2)

## 2019-11-25 LAB — NA AND K (SODIUM & POTASSIUM), RAND UR
Potassium Urine: 55 mmol/L
Sodium, Ur: 39 mmol/L

## 2019-11-25 LAB — SARS CORONAVIRUS 2 (TAT 6-24 HRS): SARS Coronavirus 2: NEGATIVE

## 2019-11-25 MED ORDER — SODIUM CHLORIDE 0.9% FLUSH
3.0000 mL | INTRAVENOUS | Status: DC | PRN
  Administered 2019-12-05: 3 mL via INTRAVENOUS

## 2019-11-25 MED ORDER — FUROSEMIDE 10 MG/ML IJ SOLN
120.0000 mg | Freq: Two times a day (BID) | INTRAVENOUS | Status: DC
  Administered 2019-11-25 – 2019-11-26 (×2): 120 mg via INTRAVENOUS
  Filled 2019-11-25: qty 2
  Filled 2019-11-25 (×2): qty 12
  Filled 2019-11-25: qty 10
  Filled 2019-11-25: qty 12

## 2019-11-25 MED ORDER — ISOSORBIDE MONONITRATE ER 30 MG PO TB24
30.0000 mg | ORAL_TABLET | Freq: Every day | ORAL | Status: DC
  Administered 2019-11-26: 30 mg via ORAL
  Filled 2019-11-25: qty 1

## 2019-11-25 MED ORDER — ATORVASTATIN CALCIUM 80 MG PO TABS
80.0000 mg | ORAL_TABLET | Freq: Every day | ORAL | Status: DC
  Administered 2019-11-26 – 2019-12-05 (×10): 80 mg via ORAL
  Filled 2019-11-25 (×3): qty 1
  Filled 2019-11-25: qty 2
  Filled 2019-11-25 (×7): qty 1

## 2019-11-25 MED ORDER — MOMETASONE FURO-FORMOTEROL FUM 200-5 MCG/ACT IN AERO
2.0000 | INHALATION_SPRAY | Freq: Two times a day (BID) | RESPIRATORY_TRACT | Status: DC
  Administered 2019-11-26 – 2019-12-05 (×18): 2 via RESPIRATORY_TRACT
  Filled 2019-11-25: qty 8.8

## 2019-11-25 MED ORDER — ALBUTEROL SULFATE HFA 108 (90 BASE) MCG/ACT IN AERS
2.0000 | INHALATION_SPRAY | Freq: Four times a day (QID) | RESPIRATORY_TRACT | Status: DC | PRN
  Filled 2019-11-25: qty 6.7

## 2019-11-25 MED ORDER — SODIUM CHLORIDE 0.9% FLUSH
3.0000 mL | Freq: Two times a day (BID) | INTRAVENOUS | Status: DC
  Administered 2019-11-25 – 2019-12-04 (×10): 3 mL via INTRAVENOUS

## 2019-11-25 MED ORDER — MORPHINE SULFATE (PF) 4 MG/ML IV SOLN
4.0000 mg | Freq: Once | INTRAVENOUS | Status: AC
  Administered 2019-11-25: 4 mg via INTRAVENOUS
  Filled 2019-11-25: qty 1

## 2019-11-25 MED ORDER — ALBUTEROL SULFATE (2.5 MG/3ML) 0.083% IN NEBU
2.5000 mg | INHALATION_SOLUTION | Freq: Four times a day (QID) | RESPIRATORY_TRACT | Status: DC | PRN
  Administered 2019-11-26: 2.5 mg via RESPIRATORY_TRACT
  Filled 2019-11-25: qty 3

## 2019-11-25 MED ORDER — DIPHENHYDRAMINE HCL 50 MG/ML IJ SOLN: 25.0000 mg | Freq: Once | INTRAMUSCULAR | Status: DC

## 2019-11-25 MED ORDER — ONDANSETRON HCL 4 MG/2ML IJ SOLN: 4.0000 mg | Freq: Four times a day (QID) | INTRAMUSCULAR | Status: DC | PRN

## 2019-11-25 MED ORDER — ACETAMINOPHEN 325 MG PO TABS
650.0000 mg | ORAL_TABLET | ORAL | Status: DC | PRN
  Administered 2019-11-25 – 2019-11-30 (×10): 650 mg via ORAL
  Filled 2019-11-25 (×11): qty 2

## 2019-11-25 MED ORDER — NITROGLYCERIN 0.4 MG SL SUBL: 0.4000 mg | SUBLINGUAL_TABLET | SUBLINGUAL | Status: DC | PRN

## 2019-11-25 MED ORDER — METOPROLOL SUCCINATE ER 50 MG PO TB24
50.0000 mg | ORAL_TABLET | Freq: Every day | ORAL | Status: DC
  Administered 2019-11-26 – 2019-11-28 (×3): 50 mg via ORAL
  Filled 2019-11-25 (×3): qty 1

## 2019-11-25 MED ORDER — APIXABAN 5 MG PO TABS
5.0000 mg | ORAL_TABLET | Freq: Two times a day (BID) | ORAL | Status: DC
  Administered 2019-11-25 – 2019-12-05 (×21): 5 mg via ORAL
  Filled 2019-11-25 (×22): qty 1

## 2019-11-25 MED ORDER — SODIUM CHLORIDE 0.9 % IV SOLN: 250.0000 mL | INTRAVENOUS | Status: DC | PRN

## 2019-11-25 MED ORDER — HYDRALAZINE HCL 10 MG PO TABS
10.0000 mg | ORAL_TABLET | Freq: Two times a day (BID) | ORAL | Status: DC
  Administered 2019-11-25 – 2019-11-26 (×3): 10 mg via ORAL
  Filled 2019-11-25 (×4): qty 1

## 2019-11-25 MED ORDER — PANTOPRAZOLE SODIUM 40 MG PO TBEC
40.0000 mg | DELAYED_RELEASE_TABLET | Freq: Two times a day (BID) | ORAL | Status: DC
  Administered 2019-11-25 – 2019-12-05 (×20): 40 mg via ORAL
  Filled 2019-11-25 (×21): qty 1

## 2019-11-25 NOTE — ED Triage Notes (Signed)
Pt coming from Dr office this via EMS. Per EMS pt experiencing increased shortness of breath and leg swelling. Hx of CHF. Pt normally on 2L [place] increased to 6L by EMS. Pt a&ox4.

## 2019-11-25 NOTE — Progress Notes (Signed)
[facility] NEPHROLOGY PROGRESS NOTE  Assessment/ Plan: Pt is a 79 y.o. yo male with history of A. fib, CAD, CHF, ischemic CM, admitted for leg claudication when he had CO2 angiogram which revealed an occluded right iliac and right common femoral artery and multiple areas of stenosis, discharged about 4 days ago presented with dyspnea on exertion, bilateral lower extremity edema and leg pain occurs at rest.  He was seen by nephrology team, last seen on 3/18 AKI on CKD with improvement of creatinine level to 1.97, now patient with with worsening renal failure. Currently patient is using about 6 L of oxygen.  Chest x-ray with pulmonary edema.  Labs showed elevated BNP, BUN 72, creatinine 3.40 and potassium level 5.2.  We are reconsulted for AKI on CKD.   #Acute kidney injury on CKDIIIb/IV: Likely hemodynamically mediated in the setting of CHF/decreased renal perfusion.  Patient recently had CO2 contrast during aortogram. He is nonoliguric.  Examination consistent with fluid overload.  I agree with IV Lasix. No uremic symptoms.  We will monitor the response with diuresis and current management.  No need for dialysis at this time. Check UA, urine sodium, bladder scan.  Recent kidney ultrasound was unremarkable on 09/30/2019.  #Mild hyperkalemia: IV Lasix would help.  Follow-up lab.  #Critical limb ischemia: Seen by vascular.  Plan for left iliac stenting with bilateral femoral endarterectomies and femoral bypass noted.  #Acute on chronic systolic heart failure: Seen by cardiologist, on diuretics.  #Permanent A. fib: Monitor heart rate.  Subjective: Seen and examined in ER.  Patient reported that his shortness of breath is much better after IV diuresis.  I noticed around 400 cc of urine in the urinal.  Denied nausea, vomiting, chest pain, headache or dizziness.  Has lower extremity pain. Objective Vital signs in last 24 hours: Vitals:   11/25/19 1115 11/25/19 1130 11/25/19 1145  11/25/19 1200  BP: 128/81 (!) 137/94 123/84 114/78  Pulse: 79     Resp: 20 18 16 15   Temp:      TempSrc:      SpO2: 100%     Weight:      Height:       Weight change:   Intake/Output Summary (Last 24 hours) at 11/25/2019 1353 Last data filed at 11/25/2019 1229 Gross per 24 hour  Intake 62 ml  Output --  Net 62 ml       Labs: Basic Metabolic Panel: Recent Labs  Lab 11/19/19 0306 11/19/19 0306 11/20/19 0511 11/21/19 0704 11/25/19 1004  NA 137   < > 137 135 134*  K 4.5   < > 4.4 4.6 5.2*  CL 101   < > 100 97* 99  CO2 26   < > 24 26 23   GLUCOSE 121*   < > 139* 109* 111*  BUN 43*   < > 42* 41* 72*  CREATININE 2.32*   < > 2.05* 1.97* 3.40*  CALCIUM 9.0   < > 8.9 8.9 8.9  PHOS 3.4  --  3.0 3.6  --    < > = values in this interval not displayed.   Liver Function Tests: Recent Labs  Lab 11/20/19 0511 11/21/19 0704 11/25/19 1004  AST  --   --  37  ALT  --   --  23  ALKPHOS  --   --  139*  BILITOT  --   --  1.9*  PROT  --   --  6.9  ALBUMIN 2.9* 2.9* 3.3*  No results for input(s): LIPASE, AMYLASE in the last 168 hours. No results for input(s): AMMONIA in the last 168 hours. CBC: Recent Labs  Lab 11/19/19 0306 11/21/19 0704 11/25/19 1004  WBC 8.7 7.2 7.0  NEUTROABS  --   --  4.5  HGB 9.1* 8.8* 9.7*  HCT 27.6* 26.6* 30.1*  MCV 77.7* 77.6* 78.0*  PLT 255 238 313   Cardiac Enzymes: No results for input(s): CKTOTAL, CKMB, CKMBINDEX, TROPONINI in the last 168 hours. CBG: No results for input(s): GLUCAP in the last 168 hours.  Iron Studies: No results for input(s): IRON, TIBC, TRANSFERRIN, FERRITIN in the last 72 hours. Studies/Results: DG Chest 2 View  Result Date: 11/25/2019 CLINICAL DATA:  CHF EXAM: CHEST - 2 VIEW COMPARISON:  Ten days ago FINDINGS: Cardiomegaly and vascular pedicle widening that is stable. No change in mild interstitial coarsening with fissure thickening. No visible effusion or pneumothorax IMPRESSION: Cardiomegaly with vascular  congestion or mild edema. Electronically Signed   By: Monte Fantasia M.D.   On: 11/25/2019 10:46    Medications: Infusions: . sodium chloride    . furosemide Stopped (11/25/19 1229)    Scheduled Medications: . apixaban  5 mg Oral BID  . atorvastatin  80 mg Oral q1800  . diphenhydrAMINE  25 mg Intravenous Once  . hydrALAZINE  10 mg Oral BID  . [START ON 11/26/2019] isosorbide mononitrate  30 mg Oral Daily  . [START ON 11/26/2019] metoprolol succinate  50 mg Oral Daily  . mometasone-formoterol  2 puff Inhalation BID  . pantoprazole  40 mg Oral BID  . sodium chloride flush  3 mL Intravenous Q12H    have reviewed scheduled and prn medications.  Physical Exam: General:NAD, comfortable Heart:, Irregular heart rate s1s2 nl, no rubs Lungs: Bibasal crackle, no wheezing, no increased work of breathing Abdomen:soft, Non-tender, non-distended Extremities: Legs are cold, trace edema noted. Neurology: Alert, awake, following commands, no asterixis  Zayla Agar Tanna Furry 11/25/2019,1:53 PM  LOS: 0 days  Pager: [phone]

## 2019-11-25 NOTE — Progress Notes (Signed)
Patient's code status in chart is DNR. Patient verbalized that it is incorrect. He is a full code. RN notified on call Cardiology MD, Akhter. Awaiting call back.

## 2019-11-25 NOTE — Consult Note (Addendum)
Hospital Consult    Reason for Consult:  Critical limb ischemia Requesting Physician:  [name] MRN #:  [id number]  History of Present Illness: This is a 79 y.o. male who was seen by Vascular surgery on 11/05/19 by Dr. Scot Dock and again on 11/15/2019.  He has hx of known right external iliac artery occlusion with severe infrainguinal arterial occlusive disease on the right and peroneal runoff only. On the left side he has a superficial femoral artery occlusion with diffuse infrainguinal arterial occlusive disease. He also has a known small 3 cm abdominal aortic aneurysm based on his duplex on 03/15/2019.   During his last hospitalization, he underwent aortogram, which revealed cccluded right iliac system and right common femoral artery,   multiple areas of stenosis within the left common and external iliac artery and significant stenosis within the common femoral artery on the left and the patient will be considered for left iliac stenting with bilateral femoral endarterectomy and left to right femoral-femoral bypass.  He was discharged home on 3/18 as his lower extremity pain had improved.  He was discharged on Eliquis/asa/statin and to f/u with Dr. Trula Slade in a couple of weeks to discuss revascularization.  He states that his legs are about the same as they were when they left the hospital.  He states the pain does wake him at night.  He doesn't really have pain at rest.  He states he gets pain with walking and when it hits, it makes him feel like his legs are coming out from under him.  He states his skin feels so tight.   He has hx of carotid disease with 40-59% bilateral ICA stenosis and  and hx of AAA that measured 3.0cm in 2020.  He presents today to the ER with worsening DOE, BLE edema and worsening BLE pain and is being admitted to cardiology for his acute on chronic systolic CHF.  He will be receiving IV Lasix 120mg  IV bid.    He has hx of Afib and is on Eliquis.  He has hx of CAD and  ischemic CM. He does have renal insufficiency.     The pt is not on a daily aspirin.   Other AC:  Eliquis The pt is on BB for hypertension.   The pt is on a statin for cholesterol management The pt is not diabetic.   Tobacco hx:  Former-quit 2013  Past Medical History:  Diagnosis Date  . AAA (abdominal aortic aneurysm) ([place])    a. 08/2015 Abd U/S: 2.7 x 2.9 x 3.2 cm infrarenal AAA.  . Bradycardia    a. Requiring discontinuation of use of BB/CCB  . Cardiac arrest - ventricular fibrillation    a. 03/2012 in setting of hypokalemia (prolonged hosp with VDRF, tracheobronchitis, ARF, shock liver, PAF, AMS felt secondary to post-anoxic encephalopathy/shock).  . Carotid artery stenosis    a. 01/2011 - 40-59% bilateral stenosis;  b. 06/2015 Carotid U/S: 40-59% bilat ICA stenosis->f/u 6 mos.  . CKD (chronic kidney disease), stage III   . Coronary artery disease    a. s/p aborted ant STEMI tx with Cypher DES to LAD 10/04 (residual at cath: D1 50%, CFX 40% and multiple dist 70%, EF 55%);   b. myoview 3/10: Ef 47%, infero-apical isch, LOW RISK - med Tx recommended;  c. 12/2012 VF Arrest/Cath: LAD 40isr, 80 apical, LCX 100 (failed PTCA), RCA nondom.  . Diabetes mellitus   . Diverticulosis 2001  . Elevated LFTs    Shock liver 03/2012  .  H/O: CVA (cardiovascular accident)   . Hematemesis    a. 12/2010 felt 2/2 Mallory Weiss tear - pt could not afford colonscopy/EGD at that time so Coumadin was deferred. Coumadin initiated 03/2012 without any evidence for bleeding.  Marland Kitchen History of Ischemic Cardiomyopathy    a. EF 50-55% by echo 03/09/12 (was 30% by echo 02/24/12); b. 03/2013 Echo: EF 55-60%, mild MR, sev dil LA, mod dil RA, PASP 31mmHg.  Marland Kitchen Hypertensive heart disease    a. echo 4/12: EF 50%, asymmetric septal hypertrophy, no SAM or LVOT gradient, LAE, PASP 35  . Inguinal hernia   . 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 Mitral regurgitation    a. Mild by echo 03/2012 and  03/2013.  . Mixed Hyperlipidemia   . Peripheral vascular disease ([place])    a. h/o LE angioplasty;  b. 08/2015 Duplex: >50% RCIA, 100% REIA, >50% LEIA, elev vel in SMA, patent IVC.  Marland Kitchen Persistent atrial fibrillation ([place])    a. Dx 12/2010-->coumadin (CHA2DS2VASc = 7).  . QT prolongation     Past Surgical History:  Procedure Laterality Date  . ABDOMINAL AORTOGRAM N/A 11/19/2019   Procedure: ABDOMINAL AORTOGRAM;  Surgeon: Serafina Mitchell, MD;  Location: [place] [facility];  Service: Cardiovascular;  Laterality: N/A;  . BIOPSY  11/07/2019   Procedure: BIOPSY;  Surgeon: [name], MD;  Location: [place];  Service: Endoscopy;;  . COLONOSCOPY WITH PROPOFOL N/A 11/07/2019   Procedure: COLONOSCOPY WITH PROPOFOL;  Surgeon: [name], MD;  Location: [facility]COPY;  Service: Endoscopy;  Laterality: N/A;  . ESOPHAGOGASTRODUODENOSCOPY (EGD) WITH PROPOFOL N/A 11/07/2019   Procedure: ESOPHAGOGASTRODUODENOSCOPY (EGD) WITH PROPOFOL;  Surgeon: [name], MD;  Location: [facility] ENDOSCOPY;  Service: Endoscopy;  Laterality: N/A;  . HEMOSTASIS CLIP PLACEMENT  11/07/2019   Procedure: HEMOSTASIS CLIP PLACEMENT;  Surgeon: [name], MD;  [facility]: [facility] ENDOSCOPY;  Service: Endoscopy;;  . HERNIA REPAIR    . LEFT HEART CATH N/A 12/30/2012   Procedure: LEFT HEART CATH;  Surgeon: [name], MD;  Location: [facility] CATH LAB;  Service: Cardiovascular;  Laterality: N/A;  . LOWER EXTREMITY ANGIOGRAPHY Bilateral 11/19/2019   Procedure: Lower Extremity Angiography;  Surgeon: Serafina Mitchell, MD;  Location: [place] [facility];  Service: Cardiovascular;  Laterality: Bilateral;  . ORIF TIBIA & FIBULA FRACTURES  01/11/07   OPEN TX OF UNICONDYLAR PLATEAU FRACTURE, IRRIGATION/DEBRIDEMENT OF OPEN FRACTURE INCLUDING BONE, REMOVAL OF EXTERNAL FIXATOR UNDER ANESTHESIA PER DR. MICHAEL HANDY  . PERIPHERAL VASCULAR CATHETERIZATION N/A 12/21/2015   Procedure: Lower Extremity Angiography;  Surgeon: [name], MD;   Location: [place] [facility];  Service: Cardiovascular;  Laterality: N/A;  . POLYPECTOMY  11/07/2019   Procedure: POLYPECTOMY;  Surgeon: [name], MD;  Location: [place];  Service: Endoscopy;;  . POPLITEAL ARTERY ANGIOPLASTY  01/07/07   s/p LEFT POPLITEAL ARTERY EXPLORATION AND VEIN PATCH ANGIOPLASTY, PER DR. EARLY, SECONDARY TO ISCHEMIC LEFT FOOT RELATED TO LEFT POPLITEAL ARTERY INJURY  . RIGHT HEART CATH N/A 10/02/2019   Procedure: RIGHT HEART CATH;  Surgeon: [name], MD;  Location: [place] [facility];  Service: Cardiovascular;  Laterality: N/A;  . SKIN GRAFT    . TEE WITHOUT CARDIOVERSION N/A 08/08/2019   Procedure: TRANSESOPHAGEAL ECHOCARDIOGRAM (TEE);  Surgeon: Lelon Perla, MD;  Location: [facility] ENDOSCOPY;  Service: Cardiovascular;  Laterality: N/A;    No Known Allergies  Prior to Admission medications   Medication Sig Start Date End Date Taking?  Authorizing Provider  acetaminophen (TYLENOL) 325 MG tablet Take 650 mg by mouth every 6 (six) hours as needed for headache.    [provider]  albuterol (VENTOLIN HFA) 108 (90 Base) MCG/ACT inhaler Inhale 2 puffs into the lungs every 6 (six) hours as needed for wheezing or shortness of breath. 08/26/19   Lelon Perla, MD  apixaban (ELIQUIS) 5 MG TABS tablet Take 1 tablet (5 mg total) by mouth 2 (two) times daily. 11/21/19   Oswald Hillock, MD  atorvastatin (LIPITOR) 80 MG tablet Take 80 mg by mouth daily.    [provider]  Ensure (ENSURE) Take 237 mLs by mouth 2 (two) times daily as needed (nutritional supplement).    [provider]  Fluticasone-Salmeterol (ADVAIR DISKUS) 250-50 MCG/DOSE AEPB Inhale 1 puff into the lungs 2 (two) times daily for 14 days. Patient taking differently: Inhale 1 puff into the lungs 2 (two) times daily as needed (shortness of breath).  10/06/19 11/25/19  Kayleen Memos, DO  hydrALAZINE (APRESOLINE) 10 MG tablet Take 1 tablet (10 mg total) by mouth 2 (two) times daily.  09/23/19 12/22/19  Verta Ellen., NP  isosorbide mononitrate (IMDUR) 30 MG 24 hr tablet Take 1 tablet (30 mg total) by mouth daily. 11/08/19   Domenic Polite, MD  metoprolol succinate (TOPROL-XL) 50 MG 24 hr tablet Take 1 tablet (50 mg total) by mouth daily. Take with or immediately following a meal. 11/22/19   Oswald Hillock, MD  nitroGLYCERIN (NITROSTAT) 0.4 MG SL tablet Place 1 tablet (0.4 mg total) under the tongue every 5 (five) minutes as needed for chest pain (Up to 3 doses). 10/22/19   Theora Gianotti, NP  oxymetazoline (AFRIN) 0.05 % nasal spray Place 1 spray into both nostrils 2 (two) times daily as needed for congestion.    [provider]  pantoprazole (PROTONIX) 40 MG tablet Take 1 tablet (40 mg total) by mouth 2 (two) times daily. 11/08/19   Domenic Polite, MD  potassium chloride SA (KLOR-CON) 20 MEQ tablet Take 1 tablet (20 mEq total) by mouth 2 (two) times daily. 10/06/19 12/05/19  Kayleen Memos, DO  torsemide (DEMADEX) 100 MG tablet Take 1 tablet (100 mg total) by mouth 2 (two) times daily. 11/21/19 05/19/20  Oswald Hillock, MD    Social History   Socioeconomic History  . Marital status: Married    Spouse name: Not on file  . Number of children: 4  . Years of education: Not on file  . Highest education level: Not on file  Occupational History  . 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 Smoking status: Former Smoker    Packs/day: 0.50    Years: 30.00    Pack years: 15.00    Types: Cigarettes    Quit date: 03/12/2012    Years since quitting: 7.7  . Smokeless tobacco: Never Used  Substance and Sexual Activity  . Alcohol use: No    Alcohol/week: 0.0 standard drinks  . Drug use: No    Frequency: 2.0 times per week  . Sexual activity: Not on file  Other Topics Concern  . 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  YES. 1/2 -1 PPD OF CIGARETTES    NO ETOH   Social Determinants of Health  Financial Resource Strain:   . Difficulty of Paying Living Expenses:   Food Insecurity:   . Worried About Charity fundraiser in the Last Year:   . Arboriculturist in the Last Year:   Transportation Needs:   . Film/video editor (Medical):   Marland Kitchen Lack of Transportation (Non-Medical):   Physical Activity:   . Days of Exercise per Week:   . Minutes of Exercise per Session:   Stress:   . Feeling of Stress :   Social Connections:   . Frequency of Communication with Friends and Family:   . Frequency of Social Gatherings with Friends and Family:   . Attends Religious Services:   . Active Member of Clubs or Organizations:   . Attends Archivist Meetings:   Marland Kitchen Marital Status:   Intimate Partner Violence:   . Fear of Current or Ex-Partner:   . Emotionally Abused:   Marland Kitchen Physically Abused:   . Sexually Abused:      Family History  Problem Relation Age of Onset  . Heart disease Father        ALSO UNCLE DIED HAD CAD  . Heart failure Father   . Brain cancer Mother   . Leukemia Mother   . Sickle cell anemia Mother   . Sickle cell anemia Other     ROS: [x]  Positive   [ ]  Negative   [ ]  All sytems reviewed and are negative  Cardiac: []  chest pain/pressure [x]  hx vfib arrest [x]  hx MR [x]  afib [x]  CM []  SOB lying flat [x]  DOE  Vascular: [x]  pain in legs while walking [x]  pain in legs at rest []  pain in legs at night []  non-healing ulcers []  hx of DVT [x]  swelling in legs [x]  hx AAA 3.0cm  Pulmonary: []  productive cough []  asthma/wheezing []  home O2  Neurologic: [x]  hx of CVA []  mini stroke  Hematologic: []  hx of cancer []  bleeding problems []  problems with blood clotting easily  Endocrine:   [x]  diabetes []  thyroid disease  GI [x]  diverticulosis [x]  hx Mallory Weis tear  GU: [x]  CKD/renal failure []  HD--[]  M/W/F or []  T/T/S  Psychiatric: []  anxiety []   depression  Musculoskeletal: []  arthritis []  joint pain  Integumentary: []  rashes []  ulcers  Constitutional: []  fever []  chills   Physical Examination  Vitals:   11/25/19 0959  BP: (!) 127/110  Pulse: (!) 38  Resp: 20  Temp: 97.6 F (36.4 C)  SpO2: 100%   Body mass index is 25.02 kg/m.  General:  WDWN with audible wheezing Gait: Not observed HENT: WNL, normocephalic Pulmonary:audible wheezing Abdomen:  soft, NT/ND, no masses Skin: without rashes Vascular Exam/Pulses: Unable to palpate pedal pulses.  Unable to palpate bilateral femoral pulses.  Unable to lay pt flat for exam.  Extremities: bilateral feet are cool, BLE's are edematous Musculoskeletal: no muscle wasting or atrophy  Neurologic: A&O X 3;  No focal weakness or paresthesias are detected; speech is fluent/normal Psychiatric:  The pt has Normal affect.  CBC    Component Value Date/Time   WBC 7.2 11/21/2019 0704   RBC 3.43 (L) 11/21/2019 0704   HGB 8.8 (L) 11/21/2019 0704   HGB 12.7 (L) 07/29/2019 1004   HCT 26.6 (L) 11/21/2019 0704   HCT 38.3 07/29/2019 1004   PLT 238 11/21/2019 0704   PLT 191 07/29/2019 1004   MCV 77.6 (L) 11/21/2019 0704   MCV 80 07/29/2019 1004   MCH 25.7 (L) 11/21/2019 3329  MCHC 33.1 11/21/2019 0704   RDW 20.6 (H) 11/21/2019 0704   RDW 15.6 (H) 07/29/2019 1004   LYMPHSABS 1.3 11/15/2019 2333   MONOABS 0.9 11/15/2019 2333   EOSABS 0.1 11/15/2019 2333   BASOSABS 0.0 11/15/2019 2333    BMET    Component Value Date/Time   NA 135 11/21/2019 0704   NA 139 10/29/2019 1139   K 4.6 11/21/2019 0704   CL 97 (L) 11/21/2019 0704   CO2 26 11/21/2019 0704   GLUCOSE 109 (H) 11/21/2019 0704   BUN 41 (H) 11/21/2019 0704   BUN 37 (H) 10/29/2019 1139   CREATININE 1.97 (H) 11/21/2019 0704   CREATININE 1.34 (H) 10/31/2016 0844   CALCIUM 8.9 11/21/2019 0704   GFRNONAA 32 (L) 11/21/2019 0704   GFRNONAA 46 (L) 10/24/2014 0943   GFRAA 37 (L) 11/21/2019 0704   GFRAA 53 (L) 10/24/2014  0943    COAGS: Lab Results  Component Value Date   INR 2.5 (H) 09/30/2019   INR 1.5 (H) 06/22/2019   INR 1.1 (A) 05/04/2018     Non-Invasive Vascular Imaging:   None today   ASSESSMENT/PLAN: This is a 79 y.o. male admitted with acute on chronic heart failure with bilateral lower extremity ischemia  -pt will be admitted to treat his heart failure -will need to stop Eliquis and place on Heparin in anticipation of a procedure once he is tuned up -Dr. Trula Slade considering left iliac stenting with bilateral femoral endarterectomies and left to right femoral to femoral bypass per his op note last week.  -Dr. Donnetta Hutching to see pt later today  Leontine Locket, PA-C [facility] [phone]   I agree with the above.  Have seen and evaluated the patient.  He was recently discharged from the hospital after being treated for decompensated heart failure and right leg rest pain.  He returned today for worsening symptoms.  He currently is having rest pain in his right leg.  He is not a operative candidate yet because of his cardiopulmonary disease.  He will be optimized so that we can hopefully operate on him in the near future.  We will follow him while he is in the hospital .  He did improve with anticoagulation, and so I would continue this.  Annamarie Major

## 2019-11-25 NOTE — Progress Notes (Signed)
   Initial work-up reviewed. VSS. Labs with K 5.2, Cr 3.4 (up from 1.97 3/18), Hgb stable at 9.7. CXR with pulmonary vascular congestion. Spoke with nephrology, Dr. Johnney Ou who will see. Bladder scan ordered per her request. Vascular surgery consult appreciated - anticipate procedure this admission for management of bilateral lower extremity ischemia once CHF status improved.  Will continue to monitor.  Abigail Butts, PA-C 11/25/19; 11:27 AM

## 2019-11-25 NOTE — H&P (Signed)
Cardiology Admission History and Physical:   Patient ID: Logan White MRN: [id number]; DOB: 21-Mar-1941   Admission date: 11/25/2019  Primary Care Provider: Patient, No Pcp Per Primary Cardiologist: Kirk Ruths, MD  Primary Electrophysiologist:  None   Chief Complaint:  SOB and LE edema  Patient Profile:   Logan White is a 79 y.o. male with CHF, atrial fibrillation, CAD, previous V fib arrest and ischemic CM.  History of Present Illness:   Logan White was seen outpatient 11/25/19 for FU CHF, atrial fibrillation, CAD, previous V fib arrest and ischemic CM. Patient with a history of PCI of his LAD in October 2004. He was admitted in June of 2013 following a ventricular fibrillation arrest that was felt related to hypokalemia (K 2.2). Patient improved following that episode. Patient admitted in April of 2014 following a ventricular fibrillation arrest. Cardiac catheterization in April of 2014 showed a patent stent in the LAD with a 40% in-stent restenosis. There was an apical 80% lesion. The circumflex was occluded. The right coronary artery was small and nondominant. The patient had attempt at PCI of the occluded circumflex but was unsuccessful. Patient was seen by Dr. Lovena Le and ICD felt not indicated as pt ruled in for non-ST elevation myocardial infarction. He was discharged with a life vest. Nuclear study January 2017 showed fixed inferior and apical defect but no ischemia. Abdominal ultrasound 7/20 showed 3 cm abdominal aortic aneurysm. Carotid Dopplers December 2020 showed 40 to 59% right and left stenosis. Transesophageal echocardiogram December 2020 showed ejection fraction 25 to 30%, biatrial enlargement, reduced RV function, moderate mitral regurgitation (3+), mild tricuspid regurgitation, mild aortic insufficiency.  Seen with syncope January 2021.  Echocardiogram January 2021 showed ejection fraction 25 to 30%, severe left ventricular enlargement, severe RV dysfunction with mild  right ventricular enlargement, severe left atrial and right atrial enlargement, mild aortic insufficiency, moderate mitral regurgitation.  Right heart catheterization January 2021 showed pulmonary capillary wedge pressure 28.  Was to follow-up with Dr. Lovena Le as an outpatient for consideration of ICD.  3/21 EGD showed gastritis and colonoscopy showed diverticulosis and polyps which were removed.  Patient readmitted March 2021 with CHF and improved with IV diuresis.  Due to continuing claudication patient had angiogram which revealed an occluded right iliac and right common femoral artery, multiple areas of stenosis in the left common and external iliac artery, significant stenosis within the common left femoral artery.  Plan was ultimately to proceed with left iliac stenting, bilateral femoral endarterectomy and left to right femorofemoral bypass.  Since patient was discharged 4 days ago he describes progressive dyspnea on exertion, bilateral lower extremity edema and bilateral lower extremity pain worse with ambulation but occurs at rest as well.  He denies orthopnea, chest pain, fevers, chills or productive cough.  Patient is markedly short of breath in the office.   Past Medical History:  Diagnosis Date  . AAA (abdominal aortic aneurysm) ([place])    a. 08/2015 Abd U/S: 2.7 x 2.9 x 3.2 cm infrarenal AAA.  . Bradycardia    a. Requiring discontinuation of use of BB/CCB  . Cardiac arrest - ventricular fibrillation    a. 03/2012 in setting of hypokalemia (prolonged hosp with VDRF, tracheobronchitis, ARF, shock liver, PAF, AMS felt secondary to post-anoxic encephalopathy/shock).  . Carotid artery stenosis    a. 01/2011 - 40-59% bilateral stenosis;  b. 06/2015 Carotid U/S: 40-59% bilat ICA stenosis->f/u 6 mos.  . CKD (chronic kidney disease), stage III   . Coronary artery disease  a. s/p aborted ant STEMI tx with Cypher DES to LAD 10/04 (residual at cath: D1 50%, CFX 40% and multiple dist 70%, EF 55%);   b.  myoview 3/10: Ef 47%, infero-apical isch, LOW RISK - med Tx recommended;  c. 12/2012 VF Arrest/Cath: LAD 40isr, 80 apical, LCX 100 (failed PTCA), RCA nondom.  . Diabetes mellitus   . Diverticulosis 2001  . Elevated LFTs    Shock liver 03/2012  . H/O: CVA (cardiovascular accident)   . Hematemesis    a. 12/2010 felt 2/2 Mallory Weiss tear - pt could not afford colonscopy/EGD at that time so Coumadin was deferred. Coumadin initiated 03/2012 without any evidence for bleeding.  Marland Kitchen History of Ischemic Cardiomyopathy    a. EF 50-55% by echo 03/09/12 (was 30% by echo 02/24/12); b. 03/2013 Echo: EF 55-60%, mild MR, sev dil LA, mod dil RA, PASP 85mmHg.  Marland Kitchen Hypertensive heart disease    a. echo 4/12: EF 50%, asymmetric septal hypertrophy, no SAM or LVOT gradient, LAE, PASP 35  . Inguinal hernia   . 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 Mitral regurgitation    a. Mild by echo 03/2012 and 03/2013.  . Mixed Hyperlipidemia   . Peripheral vascular disease ([place])    a. h/o LE angioplasty;  b. 08/2015 Duplex: >50% RCIA, 100% REIA, >50% LEIA, elev vel in SMA, patent IVC.  Marland Kitchen Persistent atrial fibrillation ([place])    a. Dx 12/2010-->coumadin (CHA2DS2VASc = 7).  . QT prolongation     Past Surgical History:  Procedure Laterality Date  . ABDOMINAL AORTOGRAM N/A 11/19/2019   Procedure: ABDOMINAL AORTOGRAM;  Surgeon: Serafina Mitchell, MD;  Location: [place] [facility];  Service: Cardiovascular;  Laterality: N/A;  . BIOPSY  11/07/2019   Procedure: BIOPSY;  Surgeon: [name], MD;  Location: [place];  Service: Endoscopy;;  . COLONOSCOPY WITH PROPOFOL N/A 11/07/2019   Procedure: COLONOSCOPY WITH PROPOFOL;  Surgeon: [name], MD;  Location: [facility] ENDOSCOPY;  Service: Endoscopy;  Laterality: N/A;  . ESOPHAGOGASTRODUODENOSCOPY (EGD) WITH PROPOFOL N/A 11/07/2019   Procedure: ESOPHAGOGASTRODUODENOSCOPY (EGD) WITH PROPOFOL;  Surgeon: [name], MD;  Location: [facility] ENDOSCOPY;   Service: Endoscopy;  Laterality: N/A;  . HEMOSTASIS CLIP PLACEMENT  11/07/2019   Procedure: HEMOSTASIS CLIP PLACEMENT;  Surgeon: [name], MD;  Location: [facility] ENDOSCOPY;  Service: Endoscopy;;  . HERNIA REPAIR    . LEFT HEART CATH N/A 12/30/2012   Procedure: LEFT HEART CATH;  Surgeon: [name], MD;  Location: [facility] CATH LAB;  Service: Cardiovascular;  Laterality: N/A;  . LOWER EXTREMITY ANGIOGRAPHY Bilateral 11/19/2019   Procedure: Lower Extremity Angiography;  Surgeon: Serafina Mitchell, MD;  Location: [place] [facility];  Service: Cardiovascular;  Laterality: Bilateral;  . ORIF TIBIA & FIBULA FRACTURES  01/11/07   OPEN TX OF UNICONDYLAR PLATEAU FRACTURE, IRRIGATION/DEBRIDEMENT OF OPEN FRACTURE INCLUDING BONE, REMOVAL OF EXTERNAL FIXATOR UNDER ANESTHESIA PER DR. MICHAEL HANDY  . PERIPHERAL VASCULAR CATHETERIZATION N/A 12/21/2015   Procedure: Lower Extremity Angiography;  Surgeon: [name], MD;  Location: [place] [facility];  Service: Cardiovascular;  Laterality: N/A;  . POLYPECTOMY  11/07/2019   Procedure: POLYPECTOMY;  Surgeon: [name], MD;  Location: [place];  Service: Endoscopy;;  . POPLITEAL ARTERY ANGIOPLASTY  01/07/07   s/p LEFT POPLITEAL ARTERY EXPLORATION AND VEIN PATCH ANGIOPLASTY, PER DR. EARLY, SECONDARY TO ISCHEMIC LEFT FOOT RELATED TO LEFT POPLITEAL ARTERY INJURY  . RIGHT HEART CATH N/A 10/02/2019   Procedure:  RIGHT HEART CATH;  Surgeon: [name], MD;  Location: [place] [facility];  Service: Cardiovascular;  Laterality: N/A;  . SKIN GRAFT    . TEE WITHOUT CARDIOVERSION N/A 08/08/2019   Procedure: TRANSESOPHAGEAL ECHOCARDIOGRAM (TEE);  Surgeon: Lelon Perla, MD;  Location: [facility] ENDOSCOPY;  Service: Cardiovascular;  Laterality: N/A;     Medications Prior to Admission: Prior to Admission medications   Medication Sig Start Date End Date Taking? Authorizing Provider  acetaminophen (TYLENOL) 325 MG tablet Take 650 mg by mouth every 6 (six) hours as needed  for headache.    [provider]  albuterol (VENTOLIN HFA) 108 (90 Base) MCG/ACT inhaler Inhale 2 puffs into the lungs every 6 (six) hours as needed for wheezing or shortness of breath. 08/26/19   Lelon Perla, MD  apixaban (ELIQUIS) 5 MG TABS tablet Take 1 tablet (5 mg total) by mouth 2 (two) times daily. 11/21/19   Oswald Hillock, MD  atorvastatin (LIPITOR) 80 MG tablet Take 80 mg by mouth daily.    [provider]  Ensure (ENSURE) Take 237 mLs by mouth 2 (two) times daily as needed (nutritional supplement).    [provider]  Fluticasone-Salmeterol (ADVAIR DISKUS) 250-50 MCG/DOSE AEPB Inhale 1 puff into the lungs 2 (two) times daily for 14 days. Patient taking differently: Inhale 1 puff into the lungs 2 (two) times daily as needed (shortness of breath).  10/06/19 11/25/19  Kayleen Memos, DO  hydrALAZINE (APRESOLINE) 10 MG tablet Take 1 tablet (10 mg total) by mouth 2 (two) times daily. 09/23/19 12/22/19  Verta Ellen., NP  isosorbide mononitrate (IMDUR) 30 MG 24 hr tablet Take 1 tablet (30 mg total) by mouth daily. 11/08/19   Domenic Polite, MD  metoprolol succinate (TOPROL-XL) 50 MG 24 hr tablet Take 1 tablet (50 mg total) by mouth daily. Take with or immediately following a meal. 11/22/19   Oswald Hillock, MD  nitroGLYCERIN (NITROSTAT) 0.4 MG SL tablet Place 1 tablet (0.4 mg total) under the tongue every 5 (five) minutes as needed for chest pain (Up to 3 doses). 10/22/19   Theora Gianotti, NP  oxymetazoline (AFRIN) 0.05 % nasal spray Place 1 spray into both nostrils 2 (two) times daily as needed for congestion.    [provider]  pantoprazole (PROTONIX) 40 MG tablet Take 1 tablet (40 mg total) by mouth 2 (two) times daily. 11/08/19   Domenic Polite, MD  potassium chloride SA (KLOR-CON) 20 MEQ tablet Take 1 tablet (20 mEq total) by mouth 2 (two) times daily. 10/06/19 12/05/19  Kayleen Memos, DO  torsemide (DEMADEX) 100 MG tablet Take 1 tablet (100 mg  total) by mouth 2 (two) times daily. 11/21/19 05/19/20  Oswald Hillock, MD     Allergies:   No Known Allergies  Social History:   Social History   Socioeconomic History  . Marital status: Married    Spouse name: Not on file  . Number of children: 4  . Years of education: Not on file  . Highest education level: Not on file  Occupational History  . 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 Smoking status: Former Smoker    Packs/day: 0.50    Years: 30.00    Pack years: 15.00    Types: Cigarettes    Quit date: 03/12/2012    Years since quitting: 7.7  . Smokeless tobacco: Never Used  Substance and Sexual Activity  .  Alcohol use: No    Alcohol/week: 0.0 standard drinks  . Drug use: No    Frequency: 2.0 times per week  . Sexual activity: Not on file  Other Topics Concern  . 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 YES. 1/2 -1 PPD OF CIGARETTES    NO ETOH   Social Determinants of Health   Financial Resource Strain:   . Difficulty of Paying Living Expenses:   Food Insecurity:   . Worried About Charity fundraiser in the Last Year:   . Arboriculturist in the Last Year:   Transportation Needs:   . Film/video editor (Medical):   Marland Kitchen Lack of Transportation (Non-Medical):   Physical Activity:   . Days of Exercise per Week:   . Minutes of Exercise per Session:   Stress:   . Feeling of Stress :   Social Connections:   . Frequency of Communication with Friends and Family:   . Frequency of Social Gatherings with Friends and Family:   . Attends Religious Services:   . Active Member of Clubs or Organizations:   . Attends Archivist Meetings:   Marland Kitchen Marital Status:   Intimate Partner Violence:   . Fear of Current or Ex-Partner:   . Emotionally Abused:   Marland Kitchen Physically Abused:   . Sexually Abused:     Family History:   The  patient's family history includes Brain cancer in his mother; Heart disease in his father; Heart failure in his father; Leukemia in his mother; Sickle cell anemia in his mother and another family member.    ROS:  Please see the history of present illness.  All other ROS reviewed and negative.     Physical Exam/Data:   Vitals:   11/25/19 0959 11/25/19 1000  BP: (!) 127/110   Pulse: (!) 38   Resp: 20   Temp: 97.6 F (36.4 C)   TempSrc: Logan   SpO2: 100%   Weight:  92 kg  Height:  6' 3.5" (1.918 m)   No intake or output data in the 24 hours ending 11/25/19 1012 Last 3 Weights 11/25/2019 11/25/2019 11/21/2019  Weight (lbs) 202 lb 13.2 oz 203 lb 12.8 oz 200 lb 12.8 oz  Weight (kg) 92 kg 92.443 kg 91.082 kg  Some encounter information is confidential and restricted. Go to Review Flowsheets activity to see all data.     Body mass index is 25.02 kg/m.  Physical Exam: Well-developed well-nourished; mild distress HEENT normal with normal eyelids. Neck supple.  Positive jugular venous distention. Skin is warm and dry.  Neck is supple.  Positive JVD. Chest with mild expiratory wheeze Cardiovascular exam is irregular Abdominal exam not tender.  Mildly distended.  Positive presacral edema. Extremities show 3+ edema. neuro grossly intact  EKG:  atrial fibrillation with PVCs or aberrantly conducted beats, cannot rule out septal infarct, lateral T wave inversion  Relevant CV Studies: Right heart Cath 09/2019:  Moderate pulmonary hypertension with mean PA pressure of 42 mmHg.  Mean pulmonary capillary wedge pressure 28 mmHg.  Phasic pulmonary artery pressure 63/29 mmHg, mean RA pressure 20 mmHg, and pulmonary artery pulsatility index is 1.7 (suggesting RV dysfunction)  Mixed venous O2 saturation 57%  Echo 09/2019: 1. Left ventricular ejection fraction, by visual estimation, is 25 to  30%. The left ventricle has severely  decreased function. There is mildly  increased left ventricular  hypertrophy.  2. Left ventricular diastolic function could not be evaluated.  3. Severely dilated left ventricular internal cavity size.  4. The left ventricle demonstrates global hypokinesis.  5. Global right ventricle has severely reduced systolic function.The  right ventricular size is mildly enlarged.  6. Left atrial size was severely dilated.  7. The interatrial septum is aneurysmal.  8. Right atrial size was severely dilated.  9. The mitral valve is normal in structure. Moderate mitral valve  regurgitation. No evidence of mitral stenosis.  10. The tricuspid valve is normal in structure. Tricuspid valve  regurgitation is mild.  11. The aortic valve is tricuspid. Aortic valve regurgitation is mild.  Mild to moderate aortic valve sclerosis/calcification without any evidence  of aortic stenosis.  12. The pulmonic valve was normal in structure. Pulmonic valve  regurgitation is mild.  13. Moderately elevated pulmonary artery systolic pressure.  14. The inferior vena cava is dilated in size with <50% respiratory  variability, suggesting right atrial pressure of 15 mmHg.  15. Severe global reduction in LV systolic function; mild LVH; 4 chamber  enlargement; severe RV dysfunction; mild AI; moderate MR; mild TR;  moderate pulmonary hypertension.    Laboratory Data:  High Sensitivity Troponin:   Recent Labs  Lab 11/15/19 1318 11/15/19 1430  TROPONINIHS 7 8      Chemistry Recent Labs  Lab 11/20/19 0511 11/21/19 0704  NA 137 135  K 4.4 4.6  CL 100 97*  CO2 24 26  GLUCOSE 139* 109*  BUN 42* 41*  CREATININE 2.05* 1.97*  CALCIUM 8.9 8.9  GFRNONAA 31* 32*  GFRAA 35* 37*  ANIONGAP 13 12    Recent Labs  Lab 11/20/19 0511 11/21/19 0704  ALBUMIN 2.9* 2.9*   Hematology Recent Labs  Lab 11/19/19 0306 11/21/19 0704  WBC 8.7 7.2  RBC 3.55* 3.43*  HGB 9.1* 8.8*  HCT 27.6* 26.6*  MCV 77.7* 77.6*  MCH 25.6* 25.7*  MCHC 33.0 33.1  RDW 20.9* 20.6*  PLT 255 238    BNPNo results for input(s): BNP, PROBNP in the last 168 hours.  DDimer No results for input(s): DDIMER in the last 168 hours.   Radiology/Studies:  No results found.   Assessment and Plan:   1 acute on chronic systolic congestive heart failure-patient markedly volume overloaded on examination.  Will need to be admitted to step down.  Will begin IV Lasix 120 mg IV twice daily.  Follow renal function closely.  2 permanent atrial fibrillation-continue beta-blocker for rate control.  Continue apixaban.    3 peripheral vascular disease-patient has severe peripheral vascular disease.  He is being considered for left iliac stenting, bilateral iliac endarterectomy and left to right femorofemoral bypass.  Given his comorbidities he would be high risk for any procedure.  However his symptoms are markedly worse and limiting.  He has pain at rest.  We will treat for congestive heart failure and once this improves will ask vascular surgery to reassess.  Patient will need pain medications while in the hospital.  4 coronary artery disease-no chest pain.  Continue statin.  No aspirin given need for apixaban.  5 ischemic cardiomyopathy-continue beta-blocker.  Continue hydralazine/nitrates.  Will avoid ARB given renal insufficiency.  If he improves will need to follow-up with Dr. Lovena Le as an outpatient for consideration of ICD.  6 carotid artery disease-follow-up carotid Dopplers December 2021.  7 abdominal aortic aneurysm-follow-up ultrasound July 2022.  8 chronic stage III  kidney disease-follow renal function closely with diuresis.  9 Mitral regurgitation-moderate on most recent TEE and echocardiogram.  10 hyperlipidemia-continue statin.   Severity of Illness: The appropriate patient status for this patient is INPATIENT. Inpatient status is judged to be reasonable and necessary in order to provide the required intensity of service to ensure the patient's safety. The patient's  presenting symptoms, physical exam findings, and initial radiographic and laboratory data in the context of their chronic comorbidities is felt to place them at high risk for further clinical deterioration. Furthermore, it is not anticipated that the patient will be medically stable for discharge from the hospital within 2 midnights of admission. The following factors support the patient status of inpatient.   " The patient's presenting symptoms include shortness of breath and Le edema. " The worrisome physical exam findings include SOB, JVD, 3+ LE edema. " The initial radiographic and laboratory data are worrisome because of atrial fibrillation. CXR, BMET pending. " The chronic co-morbidities include CAD, CHF, Atrial fibrillation, PVD.   * I certify that at the point of admission it is my clinical judgment that the patient will require inpatient hospital care spanning beyond 2 midnights from the point of admission due to high intensity of service, high risk for further deterioration and high frequency of surveillance required.*    For questions or updates, please contact [place] Please consult [email or web address] for contact info under   Above note obtained from Dr. [name] 11/25/19.

## 2019-11-25 NOTE — Patient Outreach (Signed)
[facility]) Care Management  11/25/2019  ROMMIE DUNN 1941-04-08 [id number]   Care Coordination    Referral Received: 11/22/19 Initial outreach : 11/22/19 Referral Reason : Notification of discharge of inpatient discharge from [facility] notification that patient was readmitted to [facility] on 11/25/19.    Plan  Will communicate with [facility] hospital liaison for follow up regarding discharge plans.   Joylene Draft, RN, BSN  [facility] Management Coordinator  [phone]- Mobile [phone]- Toll Free Main Office

## 2019-11-25 NOTE — ED Notes (Signed)
Lunch Tray Ordered @ 1055. 

## 2019-11-25 NOTE — ED Provider Notes (Signed)
[facility] EMERGENCY DEPARTMENT Provider Note   CSN: [id number] Arrival date & time: 11/25/19  4193     History Chief Complaint  Patient presents with  . Shortness of Breath  . Leg Swelling    Logan White is a 79 y.o. male.  HPI   This patient is a 79 year old male, he has a known history of V. fib cardiac arrest in 2013, history of chronic kidney disease, coronary disease, abdominal aortic aneurysm that is very small, he has vascular disease and persistent atrial fibrillation with some history of QT prolongation.  He was recently admitted to the hospital because of hypertension, as well as peripheral vascular disease with acute on chronic lower extremity ischemia.  It was noted that the patient needed to have vascular surgery, he wanted to go home and was cleared for discharge, he was started on Eliquis at discharge.  He also had acute on chronic congestive heart failure.  He was noted to have an angiogram that revealed an occluded right iliac and right common femoral artery, there is also multiple areas of stenosis in the left common and external iliac arteries and significant stenosis in the common left femoral artery.  There was plans for stenting, bypass, endarterectomy however the patient went home.  Over the last 4 days this patient has complained of increasing pain and shortness of breath and appeared very dyspneic in the office thus he was placed on increased oxygen (baseline 2 L) and arrives by paramedic transport on 6 L with increased work of breathing.  The patient complains to me of progressive pain which is persistent and become severe, he also has edema of his legs which he states extends all the way up into his abdomen and genitals.  Denies fevers or chills, denies significant coughing, states that shortness of breath is chronic but seems to be worse.  He no longer smoke cigarettes    Past Medical History:  Diagnosis Date  . AAA (abdominal aortic  aneurysm) ([place])    a. 08/2015 Abd U/S: 2.7 x 2.9 x 3.2 cm infrarenal AAA.  . Bradycardia    a. Requiring discontinuation of use of BB/CCB  . Cardiac arrest - ventricular fibrillation    a. 03/2012 in setting of hypokalemia (prolonged hosp with VDRF, tracheobronchitis, ARF, shock liver, PAF, AMS felt secondary to post-anoxic encephalopathy/shock).  . Carotid artery stenosis    a. 01/2011 - 40-59% bilateral stenosis;  b. 06/2015 Carotid U/S: 40-59% bilat ICA stenosis->f/u 6 mos.  . CKD (chronic kidney disease), stage III   . Coronary artery disease    a. s/p aborted ant STEMI tx with Cypher DES to LAD 10/04 (residual at cath: D1 50%, CFX 40% and multiple dist 70%, EF 55%);   b. myoview 3/10: Ef 47%, infero-apical isch, LOW RISK - med Tx recommended;  c. 12/2012 VF Arrest/Cath: LAD 40isr, 80 apical, LCX 100 (failed PTCA), RCA nondom.  . Diabetes mellitus   . Diverticulosis 2001  . Elevated LFTs    Shock liver 03/2012  . H/O: CVA (cardiovascular accident)   . Hematemesis    a. 12/2010 felt 2/2 Mallory Weiss tear - pt could not afford colonscopy/EGD at that time so Coumadin was deferred. Coumadin initiated 03/2012 without any evidence for bleeding.  Marland Kitchen History of Ischemic Cardiomyopathy    a. EF 50-55% by echo 03/09/12 (was 30% by echo 02/24/12); b. 03/2013 Echo: EF 55-60%, mild MR, sev dil LA, mod dil RA, PASP 30mmHg.  Marland Kitchen Hypertensive heart disease  a. echo 4/12: EF 50%, asymmetric septal hypertrophy, no SAM or LVOT gradient, LAE, PASP 35  . Inguinal hernia   . 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 Mitral regurgitation    a. Mild by echo 03/2012 and 03/2013.  . Mixed Hyperlipidemia   . Peripheral vascular disease ([place])    a. h/o LE angioplasty;  b. 08/2015 Duplex: >50% RCIA, 100% REIA, >50% LEIA, elev vel in SMA, patent IVC.  Marland Kitchen Persistent atrial fibrillation ([place])    a. Dx 12/2010-->coumadin (CHA2DS2VASc = 7).  . QT prolongation     Patient Active Problem List    Diagnosis Date Noted  . Palliative care by specialist   . Goals of care, counseling/discussion   . PVD (peripheral vascular disease) ([place]) 11/15/2019  . Gastritis and gastroduodenitis   . Benign neoplasm of transverse colon   . Microcytic anemia   . Acute blood loss anemia   . Melena   . CHF (congestive heart failure) ([place]) 11/04/2019  . CHF (congestive heart failure), NYHA class IV, acute, systolic ([place]) 02/77/4128  . Persistent atrial fibrillation ([place])   . Acute on chronic systolic CHF (congestive heart failure) ([place]) 06/22/2019  . Acute on chronic systolic (congestive) heart failure ([place]) 06/22/2019  . CAD S/P PCI 10/13/2015  . Cardiomyopathy, new. Etiology apeears to be NICM 10/13/2015  . AAA (abdominal aortic aneurysm) ([place])   . Peripheral vascular disease ([place])   . Carotid artery stenosis   . Hypertensive heart disease   . Bruit 10/24/2014  . Acute on chronic renal failure ([place]) 01/11/2013  . Acute MI, anterolateral wall, initial episode of care ([place]) 12/31/2012  . Cardiac arrest due to underlying cardiac condition ([place]) 12/31/2012  . VF (ventricular fibrillation) ([place]) 12/31/2012  . Abnormal LFTs 12/31/2012  . Acute on chronic renal insufficiency 12/31/2012  . Chronic anticoagulation 12/31/2012  . Hypokalemia 12/31/2012  . Weakness 04/23/2012  . Long term (current) use of anticoagulants 03/19/2012  . CKD (chronic kidney disease), stage III 03/16/2012  . Acute confusion/delerium 03/12/2012  . Delirium 03/12/2012  . Acute systolic CHF (congestive heart failure) ([place]) 03/11/2012  . Septic shock(785.52) 02/27/2012    Class: Acute  . Cardiogenic shock ([place]) 02/25/2012  . Acute renal failure ([place]) 02/25/2012  . Anoxic encephalopathy ([place]) 02/25/2012  . Ischemic cardiomyopathy 02/25/2012  . Cardiac arrest ([place]) 02/23/2012  . Acute respiratory failure with hypoxia ([place]) 02/23/2012  . Ventricular fibrillation ([place]) 02/23/2012  . DM (diabetes mellitus) ([place]) 02/23/2012  .  Screening for colon cancer 02/10/2011  . Hematemesis 01/13/2011  . Dyspnea 01/13/2011  . RENAL INSUFFICIENCY 08/26/2010  . HYPERPOTASSEMIA 09/15/2009  . TOBACCO ABUSE 08/11/2009  . GLUCOSE INTOLERANCE 10/02/2008  . Hyperlipidemia 10/02/2008  . Essential hypertension 10/02/2008  . Cerebrovascular disease 10/02/2008  . PERIPHERAL VASCULAR DISEASE 10/02/2008  . INGUINAL HERNIA 10/02/2008    Past Surgical History:  Procedure Laterality Date  . ABDOMINAL AORTOGRAM N/A 11/19/2019   Procedure: ABDOMINAL AORTOGRAM;  Surgeon: Serafina Mitchell, MD;  Location: [place] CV LAB;  Service: Cardiovascular;  Laterality: N/A;  . BIOPSY  11/07/2019   Procedure: BIOPSY;  Surgeon: [name], MD;  Location: [place];  Service: Endoscopy;;  . COLONOSCOPY WITH PROPOFOL N/A 11/07/2019   Procedure: COLONOSCOPY WITH PROPOFOL;  Surgeon: [name], MD;  Location: [facility] ENDOSCOPY;  Service: Endoscopy;  Laterality: N/A;  . ESOPHAGOGASTRODUODENOSCOPY (EGD) WITH PROPOFOL N/A 11/07/2019   Procedure: ESOPHAGOGASTRODUODENOSCOPY (EGD) WITH PROPOFOL;  Surgeon: [name], MD;  Location: [facility] ENDOSCOPY;  Service: Endoscopy;  Laterality: N/A;  . HEMOSTASIS CLIP PLACEMENT  11/07/2019   Procedure: HEMOSTASIS CLIP PLACEMENT;  Surgeon: [name], MD;  Location: [facility] ENDOSCOPY;  Service: Endoscopy;;  . HERNIA REPAIR    . LEFT HEART CATH N/A 12/30/2012   Procedure: LEFT HEART CATH;  Surgeon: [name], MD;  Location: [facility] CATH LAB;  Service: Cardiovascular;  Laterality: N/A;  . LOWER EXTREMITY ANGIOGRAPHY Bilateral 11/19/2019   Procedure: Lower Extremity Angiography;  Surgeon: Serafina Mitchell, MD;  Location: [place] [facility] LAB;  Service: Cardiovascular;  Laterality: Bilateral;  . ORIF TIBIA & FIBULA FRACTURES  01/11/07   OPEN TX OF UNICONDYLAR PLATEAU FRACTURE, IRRIGATION/DEBRIDEMENT OF OPEN FRACTURE INCLUDING BONE, REMOVAL OF EXTERNAL FIXATOR UNDER ANESTHESIA PER DR. MICHAEL HANDY  . PERIPHERAL VASCULAR  CATHETERIZATION N/A 12/21/2015   Procedure: Lower Extremity Angiography;  Surgeon: [name], MD;  Location: [place] [facility];  Service: Cardiovascular;  Laterality: N/A;  . POLYPECTOMY  11/07/2019   Procedure: POLYPECTOMY;  Surgeon: [name], MD;  Location: [place];  Service: Endoscopy;;  . POPLITEAL ARTERY ANGIOPLASTY  01/07/07   s/p LEFT POPLITEAL ARTERY EXPLORATION AND VEIN PATCH ANGIOPLASTY, PER DR. EARLY, SECONDARY TO ISCHEMIC LEFT FOOT RELATED TO LEFT POPLITEAL ARTERY INJURY  . RIGHT HEART CATH N/A 10/02/2019   Procedure: RIGHT HEART CATH;  Surgeon: [name], MD;  Location: [place] [facility];  Service: Cardiovascular;  Laterality: N/A;  . SKIN GRAFT    . TEE WITHOUT CARDIOVERSION N/A 08/08/2019   Procedure: TRANSESOPHAGEAL ECHOCARDIOGRAM (TEE);  Surgeon: Lelon Perla, MD;  Location: [facility] ENDOSCOPY;  Service: Cardiovascular;  Laterality: N/A;       Family History  Problem Relation Age of Onset  . Heart disease Father        ALSO UNCLE DIED HAD CAD  . Heart failure Father   . Brain cancer Mother   . Leukemia Mother   . Sickle cell anemia Mother   . Sickle cell anemia Other     Social History   Tobacco Use  . Smoking status: Former Smoker    Packs/day: 0.50    Years: 30.00    Pack years: 15.00    Types: Cigarettes    Quit date: 03/12/2012    Years since quitting: 7.7  . Smokeless tobacco: Never Used  Substance Use Topics  . Alcohol use: No    Alcohol/week: 0.0 standard drinks  . Drug use: No    Frequency: 2.0 times per week    Home Medications Prior to Admission medications   Medication Sig Start Date End Date Taking? Authorizing Provider  acetaminophen (TYLENOL) 325 MG tablet Take 650 mg by mouth every 6 (six) hours as needed for headache.    [provider]  albuterol (VENTOLIN HFA) 108 (90 Base) MCG/ACT inhaler Inhale 2 puffs into the lungs every 6 (six) hours as needed for wheezing or shortness of breath. 08/26/19   Lelon Perla, MD  apixaban (ELIQUIS) 5 MG TABS tablet Take 1 tablet (5 mg total) by mouth 2 (two) times daily. 11/21/19   Oswald Hillock, MD  atorvastatin (LIPITOR) 80 MG tablet Take 80 mg by mouth daily.    [provider]  Ensure (ENSURE) Take 237 mLs by mouth 2 (two) times daily as needed (nutritional supplement).    [provider]  Fluticasone-Salmeterol (ADVAIR DISKUS) 250-50 MCG/DOSE AEPB Inhale 1 puff into the lungs 2 (two) times daily for 14 days. Patient taking differently: Inhale 1  puff into the lungs 2 (two) times daily as needed (shortness of breath).  10/06/19 11/25/19  Kayleen Memos, DO  hydrALAZINE (APRESOLINE) 10 MG tablet Take 1 tablet (10 mg total) by mouth 2 (two) times daily. 09/23/19 12/22/19  Verta Ellen., NP  isosorbide mononitrate (IMDUR) 30 MG 24 hr tablet Take 1 tablet (30 mg total) by mouth daily. 11/08/19   Domenic Polite, MD  metoprolol succinate (TOPROL-XL) 50 MG 24 hr tablet Take 1 tablet (50 mg total) by mouth daily. Take with or immediately following a meal. 11/22/19   Oswald Hillock, MD  nitroGLYCERIN (NITROSTAT) 0.4 MG SL tablet Place 1 tablet (0.4 mg total) under the tongue every 5 (five) minutes as needed for chest pain (Up to 3 doses). 10/22/19   Theora Gianotti, NP  oxymetazoline (AFRIN) 0.05 % nasal spray Place 1 spray into both nostrils 2 (two) times daily as needed for congestion.    [provider]  pantoprazole (PROTONIX) 40 MG tablet Take 1 tablet (40 mg total) by mouth 2 (two) times daily. 11/08/19   Domenic Polite, MD  potassium chloride SA (KLOR-CON) 20 MEQ tablet Take 1 tablet (20 mEq total) by mouth 2 (two) times daily. 10/06/19 12/05/19  Kayleen Memos, DO  torsemide (DEMADEX) 100 MG tablet Take 1 tablet (100 mg total) by mouth 2 (two) times daily. 11/21/19 05/19/20  Oswald Hillock, MD    Allergies    Patient has no known allergies.  Review of Systems   Review of Systems  All other systems reviewed and are  negative.   Physical Exam Updated Vital Signs BP (!) 127/110 (BP Location: Left Arm)   Pulse (!) 38   Temp 97.6 F (36.4 C) (Oral)   Resp 20   Ht 1.918 m (6' 3.5")   Wt 92 kg   SpO2 100%   BMI 25.02 kg/m   Physical Exam Vitals and nursing note reviewed.  Constitutional:      General: He is not in acute distress.    Appearance: He is well-developed.  HENT:     Head: Normocephalic and atraumatic.     Mouth/Throat:     Pharynx: No oropharyngeal exudate.  Eyes:     General: No scleral icterus.       Right eye: No discharge.        Left eye: No discharge.     Conjunctiva/sclera: Conjunctivae normal.     Pupils: Pupils are equal, round, and reactive to light.  Neck:     Thyroid: No thyromegaly.     Vascular: No JVD.  Cardiovascular:     Rate and Rhythm: Normal rate and regular rhythm.     Heart sounds: Normal heart sounds. No murmur. No friction rub. No gallop.   Pulmonary:     Effort: Tachypnea and respiratory distress present.     Breath sounds: Wheezing present. No rales.  Abdominal:     General: Bowel sounds are normal. There is no distension.     Palpations: Abdomen is soft. There is no mass.     Tenderness: There is no abdominal tenderness.  Musculoskeletal:        General: No tenderness. Normal range of motion.     Cervical back: Normal range of motion and neck supple.     Right lower leg: Edema present.     Left lower leg: Edema present.  Lymphadenopathy:     Cervical: No cervical adenopathy.  Skin:    General: Skin is warm  and dry.     Findings: No erythema or rash.  Neurological:     Mental Status: He is alert.     Coordination: Coordination normal.  Psychiatric:        Behavior: Behavior normal.     ED Results / Procedures / Treatments   Labs (all labs ordered are listed, but only abnormal results are displayed) Labs Reviewed  COMPREHENSIVE METABOLIC PANEL - Abnormal; Notable for the following components:      Result Value   Sodium 134 (*)     Potassium 5.2 (*)    Glucose, Bld 111 (*)    BUN 72 (*)    Creatinine, Ser 3.40 (*)    Albumin 3.3 (*)    Alkaline Phosphatase 139 (*)    Total Bilirubin 1.9 (*)    GFR calc non Af Amer 17 (*)    GFR calc Af Amer 19 (*)    All other components within normal limits  CBC WITH DIFFERENTIAL/PLATELET - Abnormal; Notable for the following components:   RBC 3.86 (*)    Hemoglobin 9.7 (*)    HCT 30.1 (*)    MCV 78.0 (*)    MCH 25.1 (*)    RDW 21.6 (*)    nRBC 0.4 (*)    All other components within normal limits  SARS CORONAVIRUS 2 (TAT 6-24 HRS)  BRAIN NATRIURETIC PEPTIDE  PROTIME-INR    EKG EKG Interpretation  Date/Time:  Monday November 25 2019 09:59:40 EDT Ventricular Rate:  95 PR Interval:    QRS Duration: 103 QT Interval:  405 QTC Calculation: 510 R Axis:   68 Text Interpretation: Atrial fibrillation Ventricular bigeminy Low voltage, extremity leads Anteroseptal infarct, old Borderline repolarization abnormality Prolonged QT interval since last tracing no significant change Confirmed by Noemi Chapel [phone]) on 11/25/2019 10:26:13 AM   Radiology DG Chest 2 View  Result Date: 11/25/2019 CLINICAL DATA:  CHF EXAM: CHEST - 2 VIEW COMPARISON:  Ten days ago FINDINGS: Cardiomegaly and vascular pedicle widening that is stable. No change in mild interstitial coarsening with fissure thickening. No visible effusion or pneumothorax IMPRESSION: Cardiomegaly with vascular congestion or mild edema. Electronically Signed   By: Monte Fantasia M.D.   On: 11/25/2019 10:46    Procedures .Critical Care Performed by: Noemi Chapel, MD Authorized by: Noemi Chapel, MD   Critical care provider statement:    Critical care time (minutes):  35   Critical care time was exclusive of:  Separately billable procedures and treating other patients and teaching time   Critical care was necessary to treat or prevent imminent or life-threatening deterioration of the following conditions:  Cardiac failure  (vascular obstruction)   Critical care was time spent personally by me on the following activities:  Blood draw for specimens, development of treatment plan with patient or surrogate, discussions with consultants, evaluation of patient's response to treatment, examination of patient, obtaining history from patient or surrogate, ordering and performing treatments and interventions, ordering and review of laboratory studies, ordering and review of radiographic studies, pulse oximetry, re-evaluation of patient's condition and review of old charts   (including critical care time)  Medications Ordered in ED Medications  furosemide (LASIX) 120 mg in dextrose 5 % 50 mL IVPB (has no administration in time range)    ED Course  I have reviewed the triage vital signs and the nursing notes.  Pertinent labs & imaging results that were available during my care of the patient were reviewed by me and considered in my  medical decision making (see chart for details).  Clinical Course as of Nov 25 1111  Mon Nov 25, 2019  1018 Discussed the case with Dr. Sherren Mocha early at 10:18 AM, they will consult   [BM]    Clinical Course User Index [BM] Noemi Chapel, MD   MDM Rules/Calculators/A&P                      The patient's exam is consistent with chronic lower extremity ischemia and claudication but now with more constant pain.  Will discuss with vascular but likely needs to be admitted to the hospitalist service.  He will need some albuterol MDI treatments for his chronic shortness of breath, may need some increased diuresis as well given his edema.  He does have mild anasarca extending up to the umbilicus.  Lungs are wheezing but no significant rales, he does have JVD.  Was seen by Dr. Trula Slade in the hospital and did the arteriogram on March 16  I discussed the care with the cardiology service at 11:08 AM, they are admitting the patient to the hospital.  This patient is ill with what appears to be congestive  heart failure, fluid overload and vascular obstructions that will require multiple subspecialty services.  Final Clinical Impression(s) / ED Diagnoses Final diagnoses:  Acute respiratory distress  Leg edema  Claudication (HCC)      Noemi Chapel, MD 11/25/19 1115

## 2019-11-26 DIAGNOSIS — N189 Chronic kidney disease, unspecified: Secondary | ICD-10-CM

## 2019-11-26 DIAGNOSIS — N179 Acute kidney failure, unspecified: Secondary | ICD-10-CM

## 2019-11-26 DIAGNOSIS — I5043 Acute on chronic combined systolic (congestive) and diastolic (congestive) heart failure: Secondary | ICD-10-CM

## 2019-11-26 LAB — URINALYSIS, COMPLETE (UACMP) WITH MICROSCOPIC
Bilirubin Urine: NEGATIVE
Glucose, UA: NEGATIVE mg/dL
Ketones, ur: NEGATIVE mg/dL
Nitrite: NEGATIVE
Protein, ur: 30 mg/dL — AB
RBC / HPF: >50 RBC/hpf — ABNORMAL HIGH (ref 0–5)
Specific Gravity, Urine: 1.01 (ref 1.005–1.030)
WBC, UA: >50 WBC/hpf — ABNORMAL HIGH (ref 0–5)
pH: 5 (ref 5.0–8.0)

## 2019-11-26 LAB — BASIC METABOLIC PANEL
Anion gap: 12 (ref 5–15)
Anion gap: 11 (ref 5–15)
BUN: 72 mg/dL — ABNORMAL HIGH (ref 8–23)
BUN: 70 mg/dL — ABNORMAL HIGH (ref 8–23)
CO2: 25 mmol/L (ref 22–32)
CO2: 25 mmol/L (ref 22–32)
Calcium: 8.9 mg/dL (ref 8.9–10.3)
Calcium: 8.5 mg/dL — ABNORMAL LOW (ref 8.9–10.3)
Chloride: 99 mmol/L (ref 98–111)
Chloride: 98 mmol/L (ref 98–111)
Creatinine, Ser: 3.29 mg/dL — ABNORMAL HIGH (ref 0.61–1.24)
Creatinine, Ser: 3.16 mg/dL — ABNORMAL HIGH (ref 0.61–1.24)
GFR calc Af Amer: 20 mL/min — ABNORMAL LOW (ref >60)
GFR calc Af Amer: 21 mL/min — ABNORMAL LOW (ref >60)
GFR calc non Af Amer: 17 mL/min — ABNORMAL LOW (ref >60)
GFR calc non Af Amer: 18 mL/min — ABNORMAL LOW (ref >60)
Glucose, Bld: 120 mg/dL — ABNORMAL HIGH (ref 70–99)
Glucose, Bld: 134 mg/dL — ABNORMAL HIGH (ref 70–99)
Potassium: 4.4 mmol/L (ref 3.5–5.1)
Potassium: 4.3 mmol/L (ref 3.5–5.1)
Sodium: 136 mmol/L (ref 135–145)
Sodium: 134 mmol/L — ABNORMAL LOW (ref 135–145)

## 2019-11-26 LAB — MAGNESIUM: Magnesium: 2.4 mg/dL (ref 1.7–2.4)

## 2019-11-26 MED ORDER — ISOSORB DINITRATE-HYDRALAZINE 20-37.5 MG PO TABS
1.0000 | ORAL_TABLET | Freq: Three times a day (TID) | ORAL | Status: DC
  Administered 2019-11-26 – 2019-12-05 (×23): 1 via ORAL
  Filled 2019-11-26 (×28): qty 1

## 2019-11-26 MED ORDER — CAMPHOR-MENTHOL 0.5-0.5 % EX LOTN
TOPICAL_LOTION | CUTANEOUS | Status: DC | PRN
  Filled 2019-11-26: qty 222

## 2019-11-26 MED ORDER — ALBUTEROL SULFATE HFA 108 (90 BASE) MCG/ACT IN AERS
2.0000 | INHALATION_SPRAY | RESPIRATORY_TRACT | Status: DC | PRN
  Administered 2019-11-26: 2 via RESPIRATORY_TRACT

## 2019-11-26 MED ORDER — FUROSEMIDE 10 MG/ML IJ SOLN
20.0000 mg/h | INTRAVENOUS | Status: DC
  Administered 2019-11-26: 8 mg/h via INTRAVENOUS
  Administered 2019-11-27: 10 mg/h via INTRAVENOUS
  Administered 2019-11-29 – 2019-12-04 (×9): 20 mg/h via INTRAVENOUS
  Filled 2019-11-26: qty 21
  Filled 2019-11-26 (×7): qty 25
  Filled 2019-11-26: qty 21
  Filled 2019-11-26 (×6): qty 25
  Filled 2019-11-26: qty 21
  Filled 2019-11-26: qty 25

## 2019-11-26 MED ORDER — CHLORHEXIDINE GLUCONATE CLOTH 2 % EX PADS
6.0000 | MEDICATED_PAD | Freq: Every day | CUTANEOUS | Status: DC
  Administered 2019-11-26 – 2019-12-05 (×9): 6 via TOPICAL

## 2019-11-26 MED ORDER — PHENAZOPYRIDINE HCL 100 MG PO TABS: 100.0000 mg | ORAL_TABLET | Freq: Three times a day (TID) | ORAL | Status: DC

## 2019-11-26 NOTE — Progress Notes (Addendum)
Progress Note  Patient Name: Logan White Date of Encounter: 11/26/2019  Primary Cardiologist:  Kirk Ruths, MD  Subjective   Foley was painful, better now that it's out. Has voided since then.  No chest pain Still SOB, but had neb and it is better Itching on his legs and some all over Cramping in his hands  Inpatient Medications    Scheduled Meds: . apixaban  5 mg Oral BID  . atorvastatin  80 mg Oral q1800  . Chlorhexidine Gluconate Cloth  6 each Topical Daily  . diphenhydrAMINE  25 mg Intravenous Once  . hydrALAZINE  10 mg Oral BID  . isosorbide mononitrate  30 mg Oral Daily  . metoprolol succinate  50 mg Oral Daily  . mometasone-formoterol  2 puff Inhalation BID  . pantoprazole  40 mg Oral BID  . sodium chloride flush  3 mL Intravenous Q12H   Continuous Infusions: . sodium chloride    . furosemide 120 mg (11/26/19 0818)   PRN Meds: sodium chloride, acetaminophen, albuterol, nitroGLYCERIN, ondansetron (ZOFRAN) IV, sodium chloride flush   Vital Signs    Vitals:   11/26/19 0337 11/26/19 0723 11/26/19 1000 11/26/19 1124  BP: 121/74 110/64 125/87 122/71  Pulse: 78 89 95 64  Resp: 17 18  20   Temp: 98.4 F (36.9 C) 98.3 F (36.8 C)  97.7 F (36.5 C)  TempSrc: Oral Oral  Oral  SpO2: 97% 100% 100% 99%  Weight:      Height:        Intake/Output Summary (Last 24 hours) at 11/26/2019 1350 Last data filed at 11/26/2019 1229 Gross per 24 hour  Intake 720 ml  Output 650 ml  Net 70 ml   Filed Weights   11/25/19 1000 11/26/19 0023  Weight: 92 kg 91.5 kg   Last Weight  Most recent update: 11/26/2019 12:26 AM   Weight  91.5 kg (201 lb 11.5 oz)           Weight change:    Telemetry    Afib, PVCs - Personally Reviewed  ECG    None today - Personally Reviewed  Physical Exam   General: Well developed, well nourished, male appearing in no acute distress. Head: Normocephalic, atraumatic.  Neck: Supple without bruits, JVD 9 cm. Lungs:  Resp  regular and unlabored, rales bases. Heart: Irreg R&R, S1, S2, no S3, S4, or murmur; no rub. Abdomen: Soft, non-tender, non-distended with normoactive bowel sounds. No hepatomegaly. No rebound/guarding. No obvious abdominal masses. Extremities: No clubbing, cyanosis, trace edema. Distal radial pulses are 1-2+ bilaterally. Neuro: Alert and oriented X 3. Moves all extremities spontaneously. Psych: Normal affect.  Labs    Hematology Recent Labs  Lab 11/21/19 0704 11/25/19 1004  WBC 7.2 7.0  RBC 3.43* 3.86*  HGB 8.8* 9.7*  HCT 26.6* 30.1*  MCV 77.6* 78.0*  MCH 25.7* 25.1*  MCHC 33.1 32.2  RDW 20.6* 21.6*  PLT 238 313    Chemistry Recent Labs  Lab 11/20/19 0511 11/20/19 0511 11/21/19 0704 11/25/19 1004 11/26/19 0359  NA 137   < > 135 134* 136  K 4.4   < > 4.6 5.2* 4.4  CL 100   < > 97* 99 99  CO2 24   < > 26 23 25   GLUCOSE 139*   < > 109* 111* 120*  BUN 42*   < > 41* 72* 72*  CREATININE 2.05*   < > 1.97* 3.40* 3.29*  CALCIUM 8.9   < > 8.9 8.9  8.9  PROT  --   --   --  6.9  --   ALBUMIN 2.9*  --  2.9* 3.3*  --   AST  --   --   --  37  --   ALT  --   --   --  23  --   ALKPHOS  --   --   --  139*  --   BILITOT  --   --   --  1.9*  --   GFRNONAA 31*   < > 32* 17* 17*  GFRAA 35*   < > 37* 19* 20*  ANIONGAP 13   < > 12 12 12    < > = values in this interval not displayed.    Magnesium  Date Value Ref Range Status  11/21/2019 2.1 1.7 - 2.4 mg/dL Final    Comment:    Performed at [facility], [place] [address] [place], [place] [address]  11/20/2019 2.0 1.7 - 2.4 mg/dL Final    Comment:    Performed at [facility] [address], [place],  [address]  11/19/2019 2.1 1.7 - 2.4 mg/dL Final    Comment:    Performed at [facility] [address] [place], [place] [address]    High Sensitivity Troponin:   Recent Labs  Lab 11/15/19 1318 11/15/19 1430  TROPONINIHS 7 8      BNP Recent Labs  Lab 11/25/19 1004  BNP 2,757.9*     Urinalysis    Component Value Date/Time   COLORURINE YELLOW 11/26/2019 1047   APPEARANCEUR HAZY (A) 11/26/2019 1047   LABSPEC 1.010 11/26/2019 1047   PHURINE 5.0 11/26/2019 1047   GLUCOSEU NEGATIVE 11/26/2019 1047   HGBUR LARGE (A) 11/26/2019 1047   BILIRUBINUR NEGATIVE 11/26/2019 1047   KETONESUR NEGATIVE 11/26/2019 1047   PROTEINUR 30 (A) 11/26/2019 1047   UROBILINOGEN 0.2 03/23/2013 1740   NITRITE NEGATIVE 11/26/2019 1047   LEUKOCYTESUR LARGE (A) 11/26/2019 1047     Radiology    DG Chest 2 View  Result Date: 11/25/2019 CLINICAL DATA:  CHF EXAM: CHEST - 2 VIEW COMPARISON:  Ten days ago FINDINGS: Cardiomegaly and vascular pedicle widening that is stable. No change in mild interstitial coarsening with fissure thickening. No visible effusion or pneumothorax IMPRESSION: Cardiomegaly with vascular congestion or mild edema. Electronically Signed   By: Monte Fantasia M.D.   On: 11/25/2019 10:46     Cardiac Studies   ECHO:  09/29/2019  1. Left ventricular ejection fraction, by visual estimation, is 25 to  30%. The left ventricle has severely decreased function. There is mildly  increased left ventricular hypertrophy.  2. Left ventricular diastolic function could not be evaluated.  3. Severely dilated left ventricular internal cavity size.  4. The left ventricle demonstrates global hypokinesis.  5. Global right ventricle has severely reduced systolic function.The  right ventricular size is mildly enlarged.  6. Left atrial size was severely dilated.  7. The interatrial septum is aneurysmal.  8. Right atrial size was severely dilated.  9. The mitral valve is normal in structure. Moderate mitral valve  regurgitation. No evidence of mitral stenosis.  10. The tricuspid valve is normal in structure. Tricuspid valve  regurgitation is mild.  11. The aortic valve is tricuspid. Aortic valve regurgitation is mild.  Mild to moderate aortic valve sclerosis/calcification without  any evidence  of aortic stenosis.  12. The pulmonic valve was normal in structure. Pulmonic valve  regurgitation  is mild.  13. Moderately elevated pulmonary artery systolic pressure.  14. The inferior vena cava is dilated in size with <50% respiratory  variability, suggesting right atrial pressure of 15 mmHg.  15. Severe global reduction in LV systolic function; mild LVH; 4 chamber  enlargement; severe RV dysfunction; mild AI; moderate MR; mild TR;  moderate pulmonary hypertension.    Patient Profile     79 y.o. male w/ hx S-D-CHF, atrial fibrillation, CAD, previous V fib arrest and ischemic CM. Hx PAD: R external iliac artery occlusion with severe infrainguinal arterial occlusive disease and peroneal runoff only. L SFA occlusion with diffuse infrainguinal arterial occlusive disease.  Admit 03/12-03/18 w/ PAD>>needs L>R fem-fem bpg & L leg stent, CHF>>d/c on torsemide 100 mg bid, Kdur 20 meq bid, Cr 1.97, consider ICD as outpt  Admitted 03/22 from the office with CHF exacerbation.   D/c wt 10/09/2019 159 lbs Wt 02/23 176 lbs Wt 03/01 180 lbs>>d/c wt 200 lbs Admit wt 203 lbs  Assessment & Plan    1. Acute on chronic S-D-CHF - wt down 1 lb and I/O net +332 cc so far - had some problems w/ urinary retention, had foley overnight, now d/c'd - only 650 cc UOP charted so far, not all was recorded but tech says has not missed much. - has had 2 doses IV Lasix 120 mg  - only mild edema on CXR - had 3+ LE edema on exam per H&P, minimal LE edema now - with cramping and itching, will recheck BMET & Mg - MD to review data, may need to increase Lasix to 120 mg q 8 hr or 160 mg bid - see data above, dry wt is unclear  2. Acute on chronic kidney dz - d/c Cr on 03/18 was 1.97, he was 3.40 on admit>>3.29 today - continue to follow, Nephrology has seen and defers diuretics to Cards  3. PAD - Dr Trula Slade has seen - Eliquis to be stopped and pt put on heparin once close to medically ready for  procedure - follow for sx   Principal Problem:   Acute on chronic combined systolic and diastolic CHF (congestive heart failure) ([facility]) Active Problems:   Acute on chronic renal failure (HCC)   Peripheral vascular disease (HCC)    Signed, Rosaria Ferries , PA-C 1:50 PM 11/26/2019 Pager: [phone]  History and all data above reviewed.  Patient examined.  I agree with the findings as above.   The patient had severe pain with his Foley.  Improved now that it is out.  Breathing slightly better The patient exam reveals KGS:[id number]   ,  Lungs: Decreased breath sounds with basilar crackles  ,  Abd: Positive bowel sounds, no rebound no guarding, distended, Ext Severe edema to the waste  .  All available labs, radiology testing, previous records reviewed. Agree with documented assessment and plan.   Acute Systolic and Diastolic HF:  Unable to keep a Foley but post void residual is only 60 at present.  Plan to change to IV Lasix GTT and follow creat closely.   I am going to titrate up his hydral/nitrates.  Continue current dose of beta blocker.   Jeneen Rinks [place] Callas  2:07 PM  11/26/2019

## 2019-11-26 NOTE — Progress Notes (Signed)
Progress Note    11/26/2019 11:07 AM S/p arteriogram 3/16  Subjective:  Currently in bed c/o pain from foley catheter  Vascular surgery following for history of RLE rest pain  Vitals:   11/26/19 0723 11/26/19 1000  BP: 110/64 125/87  Pulse: 89 95  Resp: 18   Temp: 98.3 F (36.8 C)   SpO2: 100% 100%    Physical Exam: Cardiac:  Irregular rythym Lungs:  Bilateral wheezes, mildly laobored Extremities:  Both feet are warm; + AROM, sensation intact Abdomen:  Soft, +BS  CBC    Component Value Date/Time   WBC 7.0 11/25/2019 1004   RBC 3.86 (L) 11/25/2019 1004   HGB 9.7 (L) 11/25/2019 1004   HGB 12.7 (L) 07/29/2019 1004   HCT 30.1 (L) 11/25/2019 1004   HCT 38.3 07/29/2019 1004   PLT 313 11/25/2019 1004   PLT 191 07/29/2019 1004   MCV 78.0 (L) 11/25/2019 1004   MCV 80 07/29/2019 1004   MCH 25.1 (L) 11/25/2019 1004   MCHC 32.2 11/25/2019 1004   RDW 21.6 (H) 11/25/2019 1004   RDW 15.6 (H) 07/29/2019 1004   LYMPHSABS 1.4 11/25/2019 1004   MONOABS 1.0 11/25/2019 1004   EOSABS 0.1 11/25/2019 1004   BASOSABS 0.1 11/25/2019 1004    BMET    Component Value Date/Time   NA 136 11/26/2019 0359   NA 139 10/29/2019 1139   K 4.4 11/26/2019 0359   CL 99 11/26/2019 0359   CO2 25 11/26/2019 0359   GLUCOSE 120 (H) 11/26/2019 0359   BUN 72 (H) 11/26/2019 0359   BUN 37 (H) 10/29/2019 1139   CREATININE 3.29 (H) 11/26/2019 0359   CREATININE 1.34 (H) 10/31/2016 0844   CALCIUM 8.9 11/26/2019 0359   GFRNONAA 17 (L) 11/26/2019 0359   GFRNONAA 46 (L) 10/24/2014 0943   GFRAA 20 (L) 11/26/2019 0359   GFRAA 53 (L) 10/24/2014 0943     Intake/Output Summary (Last 24 hours) at 11/26/2019 1107 Last data filed at 11/26/2019 0025 Gross per 24 hour  Intake 542 ml  Output 350 ml  Net 192 ml    HOSPITAL MEDICATIONS Scheduled Meds: . apixaban  5 mg Oral BID  . atorvastatin  80 mg Oral q1800  . Chlorhexidine Gluconate Cloth  6 each Topical Daily  . diphenhydrAMINE  25 mg Intravenous  Once  . hydrALAZINE  10 mg Oral BID  . isosorbide mononitrate  30 mg Oral Daily  . metoprolol succinate  50 mg Oral Daily  . mometasone-formoterol  2 puff Inhalation BID  . pantoprazole  40 mg Oral BID  . sodium chloride flush  3 mL Intravenous Q12H   Continuous Infusions: . sodium chloride    . furosemide 120 mg (11/26/19 0818)   PRN Meds:.sodium chloride, acetaminophen, albuterol, nitroGLYCERIN, ondansetron (ZOFRAN) IV, sodium chloride flush Occluded right iliac system and right common femoral artery  multiple areas of stenosis within the left common and external iliac artery and significant stenosis within the common femoral artery on the left.  Assessment:  79 y.o. male is s/p: arteriogram with findings of occluded right iliac system and right common femoral artery multiple areas of stenosis within the left common and external iliac artery and significant stenosis within the common femoral artery on the left.  AKI on CKD Cr today 3.29 Hx of a. Fib CHF>>audible wheezes today Plan: -Medical management of above issues>>nephrology and cardiology.  Needs revascularization. Currently on Eliquis.  We will continue to monitor his progress.   Risa Grill, PA-C  [facility] [phone] 11/26/2019  11:07 AM

## 2019-11-26 NOTE — Progress Notes (Signed)
    Subjective  -   Very uncomfortable from Foley catheter.  Still with some right leg pain but tolerable.   Physical Exam:  Intact motor and sensory function to both legs which appear to be well-perfused.  Abdomen is soft and nontender  Mildly labored breathing.       Assessment/Plan:   Acute on chronic peripheral vascular disease in the right leg: The patient underwent angiography last week.  I believe he is a candidate for iliac stenting, bilateral femoral endarterectomy and femoral-femoral bypass graft.  This should help alleviate his rest pain symptoms.  He was a prohibitive operative candidate last week based on his cardiac status.  He was admitted with worsening heart failure.  I will plan on revascularization once he has been cleared for surgery from a cardiology standpoint.  I would continue anticoagulation with IV heparin.  Wells Merit Gadsby 11/26/2019 11:20 AM --  Vitals:   11/26/19 0723 11/26/19 1000  BP: 110/64 125/87  Pulse: 89 95  Resp: 18   Temp: 98.3 F (36.8 C)   SpO2: 100% 100%    Intake/Output Summary (Last 24 hours) at 11/26/2019 1120 Last data filed at 11/26/2019 0025 Gross per 24 hour  Intake 542 ml  Output 350 ml  Net 192 ml     Laboratory CBC    Component Value Date/Time   WBC 7.0 11/25/2019 1004   HGB 9.7 (L) 11/25/2019 1004   HGB 12.7 (L) 07/29/2019 1004   HCT 30.1 (L) 11/25/2019 1004   HCT 38.3 07/29/2019 1004   PLT 313 11/25/2019 1004   PLT 191 07/29/2019 1004    BMET    Component Value Date/Time   NA 136 11/26/2019 0359   NA 139 10/29/2019 1139   K 4.4 11/26/2019 0359   CL 99 11/26/2019 0359   CO2 25 11/26/2019 0359   GLUCOSE 120 (H) 11/26/2019 0359   BUN 72 (H) 11/26/2019 0359   BUN 37 (H) 10/29/2019 1139   CREATININE 3.29 (H) 11/26/2019 0359   CREATININE 1.34 (H) 10/31/2016 0844   CALCIUM 8.9 11/26/2019 0359   GFRNONAA 17 (L) 11/26/2019 0359   GFRNONAA 46 (L) 10/24/2014 0943   GFRAA 20 (L) 11/26/2019 0359   GFRAA  53 (L) 10/24/2014 0943    COAG Lab Results  Component Value Date   INR 2.5 (H) 11/25/2019   INR 2.5 (H) 09/30/2019   INR 1.5 (H) 06/22/2019   No results found for: PTT  Antibiotics Anti-infectives (From admission, onward)   None       V. Leia Alf, M.D., [facility] [place] Office: [phone] Pager:  [phone]

## 2019-11-26 NOTE — Progress Notes (Signed)
Patient has an indwelling catheter placed for retention in the ED. He is expressing extreme pain when urinating. Requesting some tylenol before we potentially remove. Explained that if it is removed we will have to place a new one if he continues to retain and not void on his own. MD aware

## 2019-11-26 NOTE — Progress Notes (Signed)
[facility] Progress Note   Subjective:   Having extreme pain from foley.  Dyspnea.  Wt down 0.5kg, I/Os ~ matched  Objective Vitals:   11/26/19 0023 11/26/19 0337 11/26/19 0723 11/26/19 1000  BP: 126/87 121/74 110/64 125/87  Pulse: (!) 103 78 89 95  Resp: 16 17 18    Temp: 98.2 F (36.8 C) 98.4 F (36.9 C) 98.3 F (36.8 C)   TempSrc: Oral Oral Oral   SpO2: 100% 97% 100% 100%  Weight: 91.5 kg     Height: 6' 3.5" (1.918 m)      Physical Exam General: uncomfortable in bed Heart: tachycardic and irreg, no rub Lungs: ^ WOB ? From pain, wheezes and rales appreciated Abdomen: soft Extremities: 1+ edema  Additional Objective Labs: Basic Metabolic Panel: Recent Labs  Lab 11/20/19 0511 11/20/19 0511 11/21/19 0704 11/25/19 1004 11/26/19 0359  NA 137   < > 135 134* 136  K 4.4   < > 4.6 5.2* 4.4  CL 100   < > 97* 99 99  CO2 24   < > 26 23 25   GLUCOSE 139*   < > 109* 111* 120*  BUN 42*   < > 41* 72* 72*  CREATININE 2.05*   < > 1.97* 3.40* 3.29*  CALCIUM 8.9   < > 8.9 8.9 8.9  PHOS 3.0  --  3.6  --   --    < > = values in this interval not displayed.   Liver Function Tests: Recent Labs  Lab 11/20/19 0511 11/21/19 0704 11/25/19 1004  AST  --   --  37  ALT  --   --  23  ALKPHOS  --   --  139*  BILITOT  --   --  1.9*  PROT  --   --  6.9  ALBUMIN 2.9* 2.9* 3.3*   No results for input(s): LIPASE, AMYLASE in the last 168 hours. CBC: Recent Labs  Lab 11/21/19 0704 11/25/19 1004  WBC 7.2 7.0  NEUTROABS  --  4.5  HGB 8.8* 9.7*  HCT 26.6* 30.1*  MCV 77.6* 78.0*  PLT 238 313   Blood Culture    Component Value Date/Time   SDES URINE, CLEAN CATCH 03/13/2012 2200   SPECREQUEST NONE 03/13/2012 2200   CULT NO GROWTH 03/13/2012 2200   REPTSTATUS 03/15/2012 FINAL 03/13/2012 2200    Cardiac Enzymes: No results for input(s): CKTOTAL, CKMB, CKMBINDEX, TROPONINI in the last 168 hours. CBG: No results for input(s): GLUCAP in the last 168 hours. Iron  Studies: No results for input(s): IRON, TIBC, TRANSFERRIN, FERRITIN in the last 72 hours. @lablastinr3 @ Studies/Results: DG Chest 2 View  Result Date: 11/25/2019 CLINICAL DATA:  CHF EXAM: CHEST - 2 VIEW COMPARISON:  Ten days ago FINDINGS: Cardiomegaly and vascular pedicle widening that is stable. No change in mild interstitial coarsening with fissure thickening. No visible effusion or pneumothorax IMPRESSION: Cardiomegaly with vascular congestion or mild edema. Electronically Signed   By: Monte Fantasia M.D.   On: 11/25/2019 10:46   Medications: . sodium chloride    . furosemide 120 mg (11/26/19 0818)   . apixaban  5 mg Oral BID  . atorvastatin  80 mg Oral q1800  . Chlorhexidine Gluconate Cloth  6 each Topical Daily  . diphenhydrAMINE  25 mg Intravenous Once  . hydrALAZINE  10 mg Oral BID  . isosorbide mononitrate  30 mg Oral Daily  . metoprolol succinate  50 mg Oral Daily  . mometasone-formoterol  2 puff  Inhalation BID  . pantoprazole  40 mg Oral BID  . phenazopyridine  100 mg Oral TID WC  . sodium chloride flush  3 mL Intravenous Q12H     Assessment/ Plan: Pt is a 79 y.o. yo male with history of A. fib, CAD, CHF, ischemic CM, admitted for leg claudication when he had CO2 angiogram which revealed an occluded right iliac and right common femoral artery and multiple areas of stenosis, discharged about 4 days ago presented with dyspnea on exertion, bilateral lower extremity edema and leg pain occurs at rest.  He was seen by nephrology team, last seen on 3/18 AKI on CKD with improvement of creatinine level to 1.97, now patient with with worsening renal failure. Currently patient is using about 6 L of oxygen.  Chest x-ray with pulmonary edema.  Labs showed elevated BNP, BUN 72, creatinine 3.40 and potassium level 5.2.  We are reconsulted for AKI on CKD.  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 Patient recently had CO2 contrast during aortogram. He is nonoliguric. UA unremarkable.     Renal US 09/2019 ok.    #Mild hyperkalemia: resolved.  #Critical limb ischemia: Seen by vascular.  Plan for left iliac stenting with bilateral femoral endarterectomies and femoral bypass noted when optimized.   #Acute on chronic systolic heart failure: Seen by cardiologist, on diuretics.  Has not diuresed much and appears overloaded still.  Will defer to cardiology for diuretic titration.    #Permanent A. fib: Monitor heart rate.  Jannifer Hick MD 11/26/2019, 10:56 AM  [facility] Pager: [phone]

## 2019-11-27 ENCOUNTER — Ambulatory Visit: Payer: Self-pay | Admitting: *Deleted

## 2019-11-27 LAB — BASIC METABOLIC PANEL
Anion gap: 12 (ref 5–15)
BUN: 65 mg/dL — ABNORMAL HIGH (ref 8–23)
CO2: 24 mmol/L (ref 22–32)
Calcium: 8.5 mg/dL — ABNORMAL LOW (ref 8.9–10.3)
Chloride: 98 mmol/L (ref 98–111)
Creatinine, Ser: 2.78 mg/dL — ABNORMAL HIGH (ref 0.61–1.24)
GFR calc Af Amer: 25 mL/min — ABNORMAL LOW (ref >60)
GFR calc non Af Amer: 21 mL/min — ABNORMAL LOW (ref >60)
Glucose, Bld: 113 mg/dL — ABNORMAL HIGH (ref 70–99)
Potassium: 3.7 mmol/L (ref 3.5–5.1)
Sodium: 134 mmol/L — ABNORMAL LOW (ref 135–145)

## 2019-11-27 MED ORDER — IPRATROPIUM-ALBUTEROL 0.5-2.5 (3) MG/3ML IN SOLN
3.0000 mL | Freq: Three times a day (TID) | RESPIRATORY_TRACT | Status: DC
  Administered 2019-11-27 – 2019-11-30 (×7): 3 mL via RESPIRATORY_TRACT
  Filled 2019-11-27 (×8): qty 3

## 2019-11-27 NOTE — Progress Notes (Signed)
Patient BP is low 89/52 asymptomatic also patient is having frequent PVC. Cardiology NP notified, NP instruct to hold B[place] this evening and continue to monitor the patient.

## 2019-11-27 NOTE — Consult Note (Signed)
   [facility] Inpatient Consult   11/27/2019  ROEN MACGOWAN [birth year] [id number]   Patient is currently in a pending status with [facility].  [facility] RNCM had been referred from insurance plan [[employer]] for outreach for chronic disease management services.     However, patient is listed with no primary care provider in [employer], he was to have a follow up with [facility] to establish care on 11/28/2019.  He will need to have this rescheduled if he is still hospitalized.  Chart reviewed for MD progress notes and patient is still ongoing interventions for chronic cardiac disease.  Plan: Will follow up with Inpatient Transition Of Care [TOC] team member to make aware that [facility] following and make aware of follow up [facility] appointment needs.   Of note, [facility] Care Management services does not replace or interfere with any services that are needed or arranged by inpatient [facility] care management team.  For additional questions or referrals please contact:  Natividad Brood, RN BSN [facility] Liaison  [phone] business mobile phone Toll free office [phone]  Fax number: [phone] [email or web address]

## 2019-11-27 NOTE — Progress Notes (Signed)
Progress Note  Patient Name: Logan White Date of Encounter: 11/27/2019  Primary Cardiologist:  Kirk Ruths, MD  Subjective   He thinks that he is feeling better.  No foot pain  Inpatient Medications    Scheduled Meds: . apixaban  5 mg Oral BID  . atorvastatin  80 mg Oral q1800  . Chlorhexidine Gluconate Cloth  6 each Topical Daily  . diphenhydrAMINE  25 mg Intravenous Once  . isosorbide-hydrALAZINE  1 tablet Oral TID  . metoprolol succinate  50 mg Oral Daily  . mometasone-formoterol  2 puff Inhalation BID  . pantoprazole  40 mg Oral BID  . sodium chloride flush  3 mL Intravenous Q12H   Continuous Infusions: . sodium chloride    . furosemide (LASIX) infusion 8 mg/hr (11/26/19 1523)   PRN Meds: sodium chloride, acetaminophen, albuterol, camphor-menthol, nitroGLYCERIN, ondansetron (ZOFRAN) IV, sodium chloride flush   Vital Signs    Vitals:   11/26/19 2303 11/27/19 0557 11/27/19 0709 11/27/19 0736  BP: 113/80 101/63  121/78  Pulse: 95 94  95  Resp: (!) 22 (!) 24    Temp: 98.8 F (37.1 C) 99.3 F (37.4 C)  98 F (36.7 C)  TempSrc: Oral Oral    SpO2: 100% 100% 98% 96%  Weight:      Height:        Intake/Output Summary (Last 24 hours) at 11/27/2019 0857 Last data filed at 11/27/2019 0650 Gross per 24 hour  Intake 890.69 ml  Output 1775 ml  Net -884.31 ml   Filed Weights   11/25/19 1000 11/26/19 0023  Weight: 92 kg 91.5 kg   Last Weight  Most recent update: 11/26/2019 12:26 AM   Weight  91.5 kg (201 lb 11.5 oz)           Weight change:    Telemetry    Afib, PVCs - Personally Reviewed  ECG    None today - Personally Reviewed  Physical Exam   GEN: No  acute distress.   Neck:   10 cm  JVD Cardiac: RRR, no murmurs, rubs, or gallops.  Respiratory:    Diffuse wheezing GI: Soft, nontender, non-distended, normal bowel sounds  MS:  Severe edema; No deformity. Neuro:   Nonfocal  Psych: Oriented and appropriate    Labs     Hematology Recent Labs  Lab 11/21/19 0704 11/25/19 1004  WBC 7.2 7.0  RBC 3.43* 3.86*  HGB 8.8* 9.7*  HCT 26.6* 30.1*  MCV 77.6* 78.0*  MCH 25.7* 25.1*  MCHC 33.1 32.2  RDW 20.6* 21.6*  PLT 238 313    Chemistry Recent Labs  Lab 11/21/19 0704 11/21/19 0704 11/25/19 1004 11/25/19 1004 11/26/19 0359 11/26/19 1402 11/27/19 0503  NA 135   < > 134*   < > 136 134* 134*  K 4.6   < > 5.2*   < > 4.4 4.3 3.7  CL 97*   < > 99   < > 99 98 98  CO2 26   < > 23   < > 25 25 24   GLUCOSE 109*   < > 111*   < > 120* 134* 113*  BUN 41*   < > 72*   < > 72* 70* 65*  CREATININE 1.97*   < > 3.40*   < > 3.29* 3.16* 2.78*  CALCIUM 8.9   < > 8.9   < > 8.9 8.5* 8.5*  PROT  --   --  6.9  --   --   --   --  ALBUMIN 2.9*  --  3.3*  --   --   --   --   AST  --   --  37  --   --   --   --   ALT  --   --  23  --   --   --   --   ALKPHOS  --   --  139*  --   --   --   --   BILITOT  --   --  1.9*  --   --   --   --   GFRNONAA 32*   < > 17*   < > 17* 18* 21*  GFRAA 37*   < > 19*   < > 20* 21* 25*  ANIONGAP 12   < > 12   < > 12 11 12    < > = values in this interval not displayed.    Magnesium  Date Value Ref Range Status  11/26/2019 2.4 1.7 - 2.4 mg/dL Final    Comment:    Performed at [facility], [place] [address] [place], [place] [address]  11/21/2019 2.1 1.7 - 2.4 mg/dL Final    Comment:    Performed at [facility] [address], [place], [place] [address]  11/20/2019 2.0 1.7 - 2.4 mg/dL Final    Comment:    Performed at [facility] [address], [place], [place] [address]    High Sensitivity Troponin:   Recent Labs  Lab 11/15/19 1318 11/15/19 1430  TROPONINIHS 7 8      BNP Recent Labs  Lab 11/25/19 1004  BNP 2,757.9*    Urinalysis    Component Value Date/Time   COLORURINE YELLOW 11/26/2019 1047   APPEARANCEUR HAZY (A) 11/26/2019 1047   LABSPEC 1.010 11/26/2019 1047   PHURINE 5.0 11/26/2019 1047   GLUCOSEU NEGATIVE 11/26/2019 1047   HGBUR LARGE  (A) 11/26/2019 1047   BILIRUBINUR NEGATIVE 11/26/2019 1047   KETONESUR NEGATIVE 11/26/2019 1047   PROTEINUR 30 (A) 11/26/2019 1047   UROBILINOGEN 0.2 03/23/2013 1740   NITRITE NEGATIVE 11/26/2019 1047   LEUKOCYTESUR LARGE (A) 11/26/2019 1047     Radiology    DG Chest 2 View  Result Date: 11/25/2019 CLINICAL DATA:  CHF EXAM: CHEST - 2 VIEW COMPARISON:  Ten days ago FINDINGS: Cardiomegaly and vascular pedicle widening that is stable. No change in mild interstitial coarsening with fissure thickening. No visible effusion or pneumothorax IMPRESSION: Cardiomegaly with vascular congestion or mild edema. Electronically Signed   By: Monte Fantasia M.D.   On: 11/25/2019 10:46     Cardiac Studies   ECHO:  09/29/2019  1. Left ventricular ejection fraction, by visual estimation, is 25 to  30%. The left ventricle has severely decreased function. There is mildly  increased left ventricular hypertrophy.  2. Left ventricular diastolic function could not be evaluated.  3. Severely dilated left ventricular internal cavity size.  4. The left ventricle demonstrates global hypokinesis.  5. Global right ventricle has severely reduced systolic function.The  right ventricular size is mildly enlarged.  6. Left atrial size was severely dilated.  7. The interatrial septum is aneurysmal.  8. Right atrial size was severely dilated.  9. The mitral valve is normal in structure. Moderate mitral valve  regurgitation. No evidence of mitral stenosis.  10. The tricuspid valve is normal in structure. Tricuspid valve  regurgitation is mild.  11. The aortic valve is tricuspid. Aortic valve regurgitation  is mild.  Mild to moderate aortic valve sclerosis/calcification without any evidence  of aortic stenosis.  12. The pulmonic valve was normal in structure. Pulmonic valve  regurgitation is mild.  13. Moderately elevated pulmonary artery systolic pressure.  14. The inferior vena cava is dilated in size  with <50% respiratory  variability, suggesting right atrial pressure of 15 mmHg.  15. Severe global reduction in LV systolic function; mild LVH; 4 chamber  enlargement; severe RV dysfunction; mild AI; moderate MR; mild TR;  moderate pulmonary hypertension.    Patient Profile     79 y.o. male w/ hx S-D-CHF, atrial fibrillation, CAD, previous V fib arrest and ischemic CM. Hx PAD: R external iliac artery occlusion with severe infrainguinal arterial occlusive disease and peroneal runoff only. L SFA occlusion with diffuse infrainguinal arterial occlusive disease.  Admit 03/12-03/18 w/ PAD>>needs L>R fem-fem bpg & L leg stent, CHF>>d/c on torsemide 100 mg bid, Kdur 20 meq bid, Cr 1.97, consider ICD as outpt  Admitted 03/22 from the office with CHF exacerbation.   D/c wt 10/09/2019 159 lbs Wt 02/23 176 lbs Wt 03/01 180 lbs>>d/c wt 200 lbs Admit wt 203 lbs  Assessment & Plan    Acute on chronic S-D-CHF:    Starting to put on some urine.  Some of it was unmeasured.   Weight down slightly.  Creat is improved as below.  Now on Lasix drip.   Imdur/hydral increased yesterday. Discussed with nursing and they think that he put out quite a bit of urine that could not be counted.  He now has a condom cath.  I will increase the Lasix drip.   Acute on chronic kidney dz:  Creat coming down.  Continue with diuresis.    PAD:  Plan revascularization when he is improved.      Ronnell Guadalajara , PA-C 8:57 AM 11/27/2019

## 2019-11-27 NOTE — Progress Notes (Addendum)
[facility]  Subjective  - Feet feel fine this am.  No new complaints.   Objective 121/78 95 98 F (36.7 C) (!) 24 96%  Intake/Output Summary (Last 24 hours) at 11/27/2019 0821 Last data filed at 11/27/2019 0650 Gross per 24 hour  Intake 890.69 ml  Output 1775 ml  Net -884.31 ml    Active range of motion B LE, feet warm to touch. Labored breathing with O2 support, able to talk without difficulty.   Assessment/Planning: Acute on chronic peripheral vascular disease in the right leg  He will will need intervention to possibly include:  iliac stenting, bilateral femoral endarterectomy and femoral-femoral bypass graft. Pending Heart failure work up and cardiac clearance.  Eliquis to be stopped and pt put on heparin once close to medically ready for procedure.  Roxy Horseman 11/27/2019 8:21 AM --  Laboratory Lab Results: Recent Labs    11/25/19 1004  WBC 7.0  HGB 9.7*  HCT 30.1*  PLT 313   BMET Recent Labs    11/26/19 1402 11/27/19 0503  NA 134* 134*  K 4.3 3.7  CL 98 98  CO2 25 24  GLUCOSE 134* 113*  BUN 70* 65*  CREATININE 3.16* 2.78*  CALCIUM 8.5* 8.5*    COAG Lab Results  Component Value Date   INR 2.5 (H) 11/25/2019   INR 2.5 (H) 09/30/2019   INR 1.5 (H) 06/22/2019   No results found for: PTT   I agree with the above.  I will consider surgical revascularization once she is stable from a medical perspective  Annamarie Major

## 2019-11-27 NOTE — Progress Notes (Signed)
[facility] Progress Note   Subjective:   Doing much better with foley out.  Bladder scans being completed. UOP 1775 but not full collection due to some incontinence.  Cr improved today.   Objective Vitals:   11/27/19 0557 11/27/19 0709 11/27/19 0736 11/27/19 1148  BP: 101/63  121/78 120/62  Pulse: 94  95 83  Resp: (!) 24     Temp: 99.3 F (37.4 C)  98 F (36.7 C) 98.7 F (37.1 C)  TempSrc: Oral   Oral  SpO2: 100% 98% 96% 100%  Weight:      Height:       Physical Exam General: comfortable in bed Heart: irreg, reg rate, no rub Lungs: normal WOB, no wheezes today Abdomen: soft Extremities: trace edema - improving  Additional Objective Labs: Basic Metabolic Panel: Recent Labs  Lab 11/21/19 0704 11/25/19 1004 11/26/19 0359 11/26/19 1402 11/27/19 0503  NA 135   < > 136 134* 134*  K 4.6   < > 4.4 4.3 3.7  CL 97*   < > 99 98 98  CO2 26   < > 25 25 24   GLUCOSE 109*   < > 120* 134* 113*  BUN 41*   < > 72* 70* 65*  CREATININE 1.97*   < > 3.29* 3.16* 2.78*  CALCIUM 8.9   < > 8.9 8.5* 8.5*  PHOS 3.6  --   --   --   --    < > = values in this interval not displayed.   Liver Function Tests: Recent Labs  Lab 11/21/19 0704 11/25/19 1004  AST  --  37  ALT  --  23  ALKPHOS  --  139*  BILITOT  --  1.9*  PROT  --  6.9  ALBUMIN 2.9* 3.3*   No results for input(s): LIPASE, AMYLASE in the last 168 hours. CBC: Recent Labs  Lab 11/21/19 0704 11/25/19 1004  WBC 7.2 7.0  NEUTROABS  --  4.5  HGB 8.8* 9.7*  HCT 26.6* 30.1*  MCV 77.6* 78.0*  PLT 238 313   Blood Culture    Component Value Date/Time   SDES URINE, CLEAN CATCH 03/13/2012 2200   SPECREQUEST NONE 03/13/2012 2200   CULT NO GROWTH 03/13/2012 2200   REPTSTATUS 03/15/2012 FINAL 03/13/2012 2200    Cardiac Enzymes: No results for input(s): CKTOTAL, CKMB, CKMBINDEX, TROPONINI in the last 168 hours. CBG: No results for input(s): GLUCAP in the last 168 hours. Iron Studies: No results for  input(s): IRON, TIBC, TRANSFERRIN, FERRITIN in the last 72 hours. @lablastinr3 @ Studies/Results: No results found. Medications: . sodium chloride    . furosemide (LASIX) infusion 10 mg/hr (11/27/19 0922)   . apixaban  5 mg Oral BID  . atorvastatin  80 mg Oral q1800  . Chlorhexidine Gluconate Cloth  6 each Topical Daily  . diphenhydrAMINE  25 mg Intravenous Once  . isosorbide-hydrALAZINE  1 tablet Oral TID  . metoprolol succinate  50 mg Oral Daily  . mometasone-formoterol  2 puff Inhalation BID  . pantoprazole  40 mg Oral BID  . sodium chloride flush  3 mL Intravenous Q12H     Assessment/ Plan: #Acute kidney injury on CKDIIIb/IV: Patient recently had CO2 contrast during aortogram. He is nonoliguric. UA unremarkable.     Renal US 09/2019 ok.  Likely hemodynamically mediated in the setting of CHF/decreased renal perfusion but Bladder scan showed urinary retention and foley placed but having extreme pain so it was removed; he has condom cath  now.  Following PVRs.  Renal function is improving with diuresis  #Mild hyperkalemia: resolved.  #Critical limb ischemia: Seen by vascular.  Plan for left iliac stenting with bilateral femoral endarterectomies and femoral bypass noted when optimized.  If contrast is used for the procedure he's certainly at risk for contrast nephropathy but explained to he and wife measure would be taken to mitigate.    #Acute on chronic systolic heart failure: Seen by cardiologist, on augmented diuretic regimen now and responding well. Will defer to cardiology for diuretic titration.    #Permanent A. fib: Monitor heart rate.  I don't think I'm adding much to his care for now so will sign off; please page with questions/concerns.  Jannifer Hick MD 11/27/2019, 2:22 PM  [facility] Pager: [phone]

## 2019-11-27 NOTE — Progress Notes (Signed)
Pharmacist Heart Failure Core Measure Documentation  Assessment: Logan White has an EF documented as 25-30% on 09/29/19 by Kirk Ruths.  Rationale: Heart failure patients with left ventricular systolic dysfunction (LVSD) and an EF < 40% should be prescribed an angiotensin converting enzyme inhibitor (ACEI) or angiotensin receptor blocker (ARB) at discharge unless a contraindication is documented in the medical record.  This patient is not currently on an ACEI or ARB for HF.  This note is being placed in the record in order to provide documentation that a contraindication to the use of these agents is present for this encounter.  ACE Inhibitor or Angiotensin Receptor Blocker is contraindicated (specify all that apply)  []   ACEI allergy AND ARB allergy []   Angioedema []   Moderate or severe aortic stenosis []   Hyperkalemia []   Hypotension []   Renal artery stenosis [x]   Worsening renal function, preexisting renal disease or dysfunction   Arturo Morton, PharmD, BCPS Please check AMION for all [name] contact numbers Clinical Pharmacist 11/27/2019 11:49 AM

## 2019-11-28 ENCOUNTER — Encounter (HOSPITAL_COMMUNITY)
Admission: EM | Disposition: A | Discharge status: Home Health | Payer: Self-pay | Source: Ambulatory Visit | Attending: Cardiology

## 2019-11-28 ENCOUNTER — Ambulatory Visit: Payer: Medicare HMO

## 2019-11-28 DIAGNOSIS — I5082 Biventricular heart failure: Secondary | ICD-10-CM

## 2019-11-28 HISTORY — PX: CENTRAL LINE INSERTION: CATH118232

## 2019-11-28 HISTORY — PX: RIGHT HEART CATH: CATH118263

## 2019-11-28 LAB — POCT I-STAT EG7
Acid-Base Excess: 2 mmol/L (ref 0.0–2.0)
Acid-base deficit: 1 mmol/L (ref 0.0–2.0)
Bicarbonate: 24.2 mmol/L (ref 20.0–28.0)
Bicarbonate: 27.7 mmol/L (ref 20.0–28.0)
Calcium, Ion: 0.97 mmol/L — ABNORMAL LOW (ref 1.15–1.40)
Calcium, Ion: 1.14 mmol/L — ABNORMAL LOW (ref 1.15–1.40)
HCT: 24 % — ABNORMAL LOW (ref 39.0–52.0)
HCT: 27 % — ABNORMAL LOW (ref 39.0–52.0)
Hemoglobin: 8.2 g/dL — ABNORMAL LOW (ref 13.0–17.0)
Hemoglobin: 9.2 g/dL — ABNORMAL LOW (ref 13.0–17.0)
O2 Saturation: 59 %
O2 Saturation: 60 %
Potassium: 3.5 mmol/L (ref 3.5–5.1)
Potassium: 3.9 mmol/L (ref 3.5–5.1)
Sodium: 135 mmol/L (ref 135–145)
Sodium: 140 mmol/L (ref 135–145)
TCO2: 25 mmol/L (ref 22–32)
TCO2: 29 mmol/L (ref 22–32)
pCO2, Ven: 42 mmHg — ABNORMAL LOW (ref 44.0–60.0)
pCO2, Ven: 46.6 mmHg (ref 44.0–60.0)
pH, Ven: 7.369 (ref 7.250–7.430)
pH, Ven: 7.383 (ref 7.250–7.430)
pO2, Ven: 32 mmHg (ref 32.0–45.0)
pO2, Ven: 32 mmHg (ref 32.0–45.0)

## 2019-11-28 LAB — BASIC METABOLIC PANEL
Anion gap: 10 (ref 5–15)
BUN: 61 mg/dL — ABNORMAL HIGH (ref 8–23)
CO2: 25 mmol/L (ref 22–32)
Calcium: 8.3 mg/dL — ABNORMAL LOW (ref 8.9–10.3)
Chloride: 98 mmol/L (ref 98–111)
Creatinine, Ser: 2.81 mg/dL — ABNORMAL HIGH (ref 0.61–1.24)
GFR calc Af Amer: 24 mL/min — ABNORMAL LOW (ref >60)
GFR calc non Af Amer: 21 mL/min — ABNORMAL LOW (ref >60)
Glucose, Bld: 112 mg/dL — ABNORMAL HIGH (ref 70–99)
Potassium: 3.9 mmol/L (ref 3.5–5.1)
Sodium: 133 mmol/L — ABNORMAL LOW (ref 135–145)

## 2019-11-28 SURGERY — RIGHT HEART CATH
Anesthesia: LOCAL

## 2019-11-28 MED ORDER — MIDAZOLAM HCL 2 MG/2ML IJ SOLN
INTRAMUSCULAR | Status: AC
  Filled 2019-11-28: qty 2

## 2019-11-28 MED ORDER — FENTANYL CITRATE (PF) 100 MCG/2ML IJ SOLN
INTRAMUSCULAR | Status: AC
  Filled 2019-11-28: qty 2

## 2019-11-28 MED ORDER — HEPARIN (PORCINE) IN NACL 1000-0.9 UT/500ML-% IV SOLN
INTRAVENOUS | Status: DC | PRN
  Administered 2019-11-28: 500 mL

## 2019-11-28 MED ORDER — SODIUM CHLORIDE 0.9% FLUSH
3.0000 mL | Freq: Two times a day (BID) | INTRAVENOUS | Status: DC
  Administered 2019-11-28 – 2019-12-04 (×10): 3 mL via INTRAVENOUS

## 2019-11-28 MED ORDER — LIDOCAINE HCL (PF) 1 % IJ SOLN
INTRAMUSCULAR | Status: AC
  Filled 2019-11-28: qty 30

## 2019-11-28 MED ORDER — SODIUM CHLORIDE 0.9 % IV SOLN: 250.0000 mL | INTRAVENOUS | Status: DC | PRN

## 2019-11-28 MED ORDER — SODIUM CHLORIDE 0.9 % IV SOLN: INTRAVENOUS | Status: DC

## 2019-11-28 MED ORDER — SODIUM CHLORIDE 0.9% FLUSH: 3.0000 mL | INTRAVENOUS | Status: DC | PRN

## 2019-11-28 MED ORDER — LIDOCAINE HCL (PF) 1 % IJ SOLN
INTRAMUSCULAR | Status: DC | PRN
  Administered 2019-11-28: 5 mL via INTRADERMAL
  Administered 2019-11-28: 2 mL via INTRADERMAL

## 2019-11-28 MED ORDER — ASPIRIN 81 MG PO CHEW: 81.0000 mg | CHEWABLE_TABLET | ORAL | Status: AC

## 2019-11-28 MED ORDER — MIDAZOLAM HCL 2 MG/2ML IJ SOLN
INTRAMUSCULAR | Status: DC | PRN
  Administered 2019-11-28: 1 mg via INTRAVENOUS

## 2019-11-28 MED ORDER — HEPARIN (PORCINE) IN NACL 1000-0.9 UT/500ML-% IV SOLN
INTRAVENOUS | Status: AC
  Filled 2019-11-28: qty 1000

## 2019-11-28 SURGICAL SUPPLY — 12 items
CATH SWAN GANZ 7F STRAIGHT (CATHETERS) ×1 IMPLANT
KIT CV MULTILUMEN 7FR 20 (SET/KITS/TRAYS/PACK) ×3
KIT CV MULTILUMEN 7FR 20 SUB (SET/KITS/TRAYS/PACK) IMPLANT
KIT MICROPUNCTURE NIT STIFF (SHEATH) ×1 IMPLANT
PACK CARDIAC CATHETERIZATION (CUSTOM PROCEDURE TRAY) ×3 IMPLANT
PATCH THROMBIX TOPICAL PLAIN (HEMOSTASIS) ×1 IMPLANT
SHEATH PINNACLE 7F 10CM (SHEATH) ×1 IMPLANT
SHEATH PROBE COVER 6X72 (BAG) ×1 IMPLANT
TRANSDUCER W/STOPCOCK (MISCELLANEOUS) ×3 IMPLANT
TUBING ART PRESS 72  MALE/FEM (TUBING) ×3
TUBING ART PRESS 72 MALE/FEM (TUBING) IMPLANT
WIRE EMERALD 3MM-J .025X260CM (WIRE) ×1 IMPLANT

## 2019-11-28 NOTE — Progress Notes (Addendum)
  Progress Note    11/28/2019 8:06 AM * No surgery found *  Subjective:  No complaints   Vitals:   11/28/19 0117 11/28/19 0453  BP: 118/71 110/83  Pulse: 92 90  Resp: (!) 24 20  Temp: 98.9 F (37.2 C) 98.3 F (36.8 C)  SpO2: 100% 100%   Physical Exam: Lungs:  Non labored Extremities:  Motor and sensory intact BLE; web space breaking down between GT and 2nd toe Neurologic: A&O  CBC    Component Value Date/Time   WBC 7.0 11/25/2019 1004   RBC 3.86 (L) 11/25/2019 1004   HGB 9.7 (L) 11/25/2019 1004   HGB 12.7 (L) 07/29/2019 1004   HCT 30.1 (L) 11/25/2019 1004   HCT 38.3 07/29/2019 1004   PLT 313 11/25/2019 1004   PLT 191 07/29/2019 1004   MCV 78.0 (L) 11/25/2019 1004   MCV 80 07/29/2019 1004   MCH 25.1 (L) 11/25/2019 1004   MCHC 32.2 11/25/2019 1004   RDW 21.6 (H) 11/25/2019 1004   RDW 15.6 (H) 07/29/2019 1004   LYMPHSABS 1.4 11/25/2019 1004   MONOABS 1.0 11/25/2019 1004   EOSABS 0.1 11/25/2019 1004   BASOSABS 0.1 11/25/2019 1004    BMET    Component Value Date/Time   NA 133 (L) 11/28/2019 0345   NA 139 10/29/2019 1139   K 3.9 11/28/2019 0345   CL 98 11/28/2019 0345   CO2 25 11/28/2019 0345   GLUCOSE 112 (H) 11/28/2019 0345   BUN 61 (H) 11/28/2019 0345   BUN 37 (H) 10/29/2019 1139   CREATININE 2.81 (H) 11/28/2019 0345   CREATININE 1.34 (H) 10/31/2016 0844   CALCIUM 8.3 (L) 11/28/2019 0345   GFRNONAA 21 (L) 11/28/2019 0345   GFRNONAA 46 (L) 10/24/2014 0943   GFRAA 24 (L) 11/28/2019 0345   GFRAA 53 (L) 10/24/2014 0943    INR    Component Value Date/Time   INR 2.5 (H) 11/25/2019 1010     Intake/Output Summary (Last 24 hours) at 11/28/2019 0806 Last data filed at 11/28/2019 0448 Gross per 24 hour  Intake 1414.44 ml  Output 875 ml  Net 539.44 ml     Assessment/Plan:  79 y.o. male with occluded R iliac artery and B SFA  Feet still warm to touch with motor and sensory intact Floss 2x2 between R GT and 2nd toe due to skin breakdown; change  daily Cardiology optimizing for surgery Will need Eliquis transitioned to heparin when timing of surgery is determined   Dagoberto Ligas, PA-C [facility] [phone] 11/28/2019 8:06 AM   Plan for revascularization once cleared medically, I am out of town next week, so it will be the week after.  Annamarie Major
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
## 2019-11-28 NOTE — Progress Notes (Signed)
Progress Note  Patient Name: Logan White Date of Encounter: 11/28/2019  Primary Cardiologist:  Kirk Ruths, MD  Subjective   Feels well.  No pain.  No SOB.  Does not want to get out of bed.  Inpatient Medications    Scheduled Meds: . apixaban  5 mg Oral BID  . atorvastatin  80 mg Oral q1800  . Chlorhexidine Gluconate Cloth  6 each Topical Daily  . diphenhydrAMINE  25 mg Intravenous Once  . ipratropium-albuterol  3 mL Nebulization TID  . isosorbide-hydrALAZINE  1 tablet Oral TID  . metoprolol succinate  50 mg Oral Daily  . mometasone-formoterol  2 puff Inhalation BID  . pantoprazole  40 mg Oral BID  . sodium chloride flush  3 mL Intravenous Q12H   Continuous Infusions: . sodium chloride    . furosemide (LASIX) infusion 10 mg/hr (11/27/19 2214)   PRN Meds: sodium chloride, acetaminophen, albuterol, camphor-menthol, nitroGLYCERIN, ondansetron (ZOFRAN) IV, sodium chloride flush   Vital Signs    Vitals:   11/28/19 0453 11/28/19 0500 11/28/19 0819 11/28/19 0847  BP: 110/83   103/63  Pulse: 90   85  Resp: 20   18  Temp: 98.3 F (36.8 C)   99 F (37.2 C)  TempSrc: Oral   Oral  SpO2: 100%  100% 100%  Weight:  92.5 kg    Height:        Intake/Output Summary (Last 24 hours) at 11/28/2019 0932 Last data filed at 11/28/2019 1610 Gross per 24 hour  Intake 1394.44 ml  Output 1225 ml  Net 169.44 ml   Filed Weights   11/25/19 1000 11/26/19 0023 11/28/19 0500  Weight: 92 kg 91.5 kg 92.5 kg   Last Weight  Most recent update: 11/28/2019  6:41 AM   Weight  92.5 kg (203 lb 14.4 oz)           Weight change:    Telemetry    Atrial fib with ventricular ectopy - Personally Reviewed  ECG    None today - Personally Reviewed  Physical Exam   GEN: No  acute distress.   Neck: No  JVD Cardiac: Irregular RR, distant heart sounds Respiratory:     Diffuse wheezing and basilar crackles with decreased breath sounds. GI: Soft, nontender, non-distended, normal  bowel sounds  MS:  Severe edema to waste. Neuro:   Nonfocal  Psych: Oriented and appropriate    Labs    Hematology Recent Labs  Lab 11/25/19 1004  WBC 7.0  RBC 3.86*  HGB 9.7*  HCT 30.1*  MCV 78.0*  MCH 25.1*  MCHC 32.2  RDW 21.6*  PLT 313    Chemistry Recent Labs  Lab 11/25/19 1004 11/26/19 0359 11/26/19 1402 11/27/19 0503 11/28/19 0345  NA 134*   < > 134* 134* 133*  K 5.2*   < > 4.3 3.7 3.9  CL 99   < > 98 98 98  CO2 23   < > 25 24 25   GLUCOSE 111*   < > 134* 113* 112*  BUN 72*   < > 70* 65* 61*  CREATININE 3.40*   < > 3.16* 2.78* 2.81*  CALCIUM 8.9   < > 8.5* 8.5* 8.3*  PROT 6.9  --   --   --   --   ALBUMIN 3.3*  --   --   --   --   AST 37  --   --   --   --   ALT 23  --   --   --   --  ALKPHOS 139*  --   --   --   --   BILITOT 1.9*  --   --   --   --   GFRNONAA 17*   < > 18* 21* 21*  GFRAA 19*   < > 21* 25* 24*  ANIONGAP 12   < > 11 12 10    < > = values in this interval not displayed.    Magnesium  Date Value Ref Range Status  11/26/2019 2.4 1.7 - 2.4 mg/dL Final    Comment:    Performed at [facility], [place] [address], [place], [place] [address]  11/21/2019 2.1 1.7 - 2.4 mg/dL Final    Comment:    Performed at [facility] [address], [place], [place] [address]  11/20/2019 2.0 1.7 - 2.4 mg/dL Final    Comment:    Performed at [facility] [address] [place], [place] [address]    High Sensitivity Troponin:   Recent Labs  Lab 11/15/19 1318 11/15/19 1430  TROPONINIHS 7 8      BNP Recent Labs  Lab 11/25/19 1004  BNP 2,757.9*    Urinalysis    Component Value Date/Time   COLORURINE YELLOW 11/26/2019 1047   APPEARANCEUR HAZY (A) 11/26/2019 1047   LABSPEC 1.010 11/26/2019 1047   PHURINE 5.0 11/26/2019 1047   GLUCOSEU NEGATIVE 11/26/2019 1047   HGBUR LARGE (A) 11/26/2019 1047   BILIRUBINUR NEGATIVE 11/26/2019 1047   KETONESUR NEGATIVE 11/26/2019 1047   PROTEINUR 30 (A) 11/26/2019 1047   UROBILINOGEN 0.2  03/23/2013 1740   NITRITE NEGATIVE 11/26/2019 1047   LEUKOCYTESUR LARGE (A) 11/26/2019 1047     Radiology    DG Chest 2 View  Result Date: 11/25/2019 CLINICAL DATA:  CHF EXAM: CHEST - 2 VIEW COMPARISON:  Ten days ago FINDINGS: Cardiomegaly and vascular pedicle widening that is stable. No change in mild interstitial coarsening with fissure thickening. No visible effusion or pneumothorax IMPRESSION: Cardiomegaly with vascular congestion or mild edema. Electronically Signed   By: Monte Fantasia M.D.   On: 11/25/2019 10:46     Cardiac Studies   ECHO:  09/29/2019  1. Left ventricular ejection fraction, by visual estimation, is 25 to  30%. The left ventricle has severely decreased function. There is mildly  increased left ventricular hypertrophy.  2. Left ventricular diastolic function could not be evaluated.  3. Severely dilated left ventricular internal cavity size.  4. The left ventricle demonstrates global hypokinesis.  5. Global right ventricle has severely reduced systolic function.The  right ventricular size is mildly enlarged.  6. Left atrial size was severely dilated.  7. The interatrial septum is aneurysmal.  8. Right atrial size was severely dilated.  9. The mitral valve is normal in structure. Moderate mitral valve  regurgitation. No evidence of mitral stenosis.  10. The tricuspid valve is normal in structure. Tricuspid valve  regurgitation is mild.  11. The aortic valve is tricuspid. Aortic valve regurgitation is mild.  Mild to moderate aortic valve sclerosis/calcification without any evidence  of aortic stenosis.  12. The pulmonic valve was normal in structure. Pulmonic valve  regurgitation is mild.  13. Moderately elevated pulmonary artery systolic pressure.  14. The inferior vena cava is dilated in size with <50% respiratory  variability, suggesting right atrial pressure of 15 mmHg.  15. Severe global reduction in LV systolic function; mild LVH; 4 chamber   enlargement; severe RV dysfunction; mild AI; moderate MR; mild TR;  moderate  pulmonary hypertension.    Patient Profile     79 y.o. male w/ hx S-D-CHF, atrial fibrillation, CAD, previous V fib arrest and ischemic CM. Hx PAD: R external iliac artery occlusion with severe infrainguinal arterial occlusive disease and peroneal runoff only. L SFA occlusion with diffuse infrainguinal arterial occlusive disease.  Admit 03/12-03/18 w/ PAD>>needs L>R fem-fem bpg & L leg stent, CHF>>d/c on torsemide 100 mg bid, Kdur 20 meq bid, Cr 1.97, consider ICD as outpt  Admitted 03/22 from the office with CHF exacerbation.   D/c wt 10/09/2019 159 lbs Wt 02/23 176 lbs Wt 03/01 180 lbs>>d/c wt 200 lbs Admit wt 203 lbs  Assessment & Plan    Acute on chronic S-D-CHF:      Intake and output is incomplete.  Lasix GTT increased yesterday.  Weight is up not down.   Needs central line for probable inotropic therapy.  Might ultimately need dialysis for volume management.  I have discussed with Dr. Haroldine Laws and he plans right heart cath and central line access.  The Advanced HF Team will consult.    Acute on chronic kidney dz:  Creat is stable today.  Improved from admission but not falling further.  I reviewed his right heart cath results and reviewed the images from his echo earlier this year.  Significant RV dysfunction.  Baseline renal dysfunction exacerbated RV dysfunction, diastolic and systolic LV dysfunction.  Likely to continue to flounder with volume management and continued renal dysfunction without advanced therapies or dialysis.     PAD:  Plan revascularization when he is improved.    CAD:  History of occluded dominant circ not amenable to PCI.  Continue medical management.    Ronnell Guadalajara , PA-C 9:32 AM 11/28/2019

## 2019-11-28 NOTE — H&P (View-Only) (Signed)
Progress Note  Patient Name: Logan White Date of Encounter: 11/28/2019  Primary Cardiologist:  Kirk Ruths, MD  Subjective   Feels well.  No pain.  No SOB.  Does not want to get out of bed.  Inpatient Medications    Scheduled Meds: . apixaban  5 mg Oral BID  . atorvastatin  80 mg Oral q1800  . Chlorhexidine Gluconate Cloth  6 each Topical Daily  . diphenhydrAMINE  25 mg Intravenous Once  . ipratropium-albuterol  3 mL Nebulization TID  . isosorbide-hydrALAZINE  1 tablet Oral TID  . metoprolol succinate  50 mg Oral Daily  . mometasone-formoterol  2 puff Inhalation BID  . pantoprazole  40 mg Oral BID  . sodium chloride flush  3 mL Intravenous Q12H   Continuous Infusions: . sodium chloride    . furosemide (LASIX) infusion 10 mg/hr (11/27/19 2214)   PRN Meds: sodium chloride, acetaminophen, albuterol, camphor-menthol, nitroGLYCERIN, ondansetron (ZOFRAN) IV, sodium chloride flush   Vital Signs    Vitals:   11/28/19 0453 11/28/19 0500 11/28/19 0819 11/28/19 0847  BP: 110/83   103/63  Pulse: 90   85  Resp: 20   18  Temp: 98.3 F (36.8 C)   99 F (37.2 C)  TempSrc: Oral   Oral  SpO2: 100%  100% 100%  Weight:  92.5 kg    Height:        Intake/Output Summary (Last 24 hours) at 11/28/2019 0932 Last data filed at 11/28/2019 0762 Gross per 24 hour  Intake 1394.44 ml  Output 1225 ml  Net 169.44 ml   Filed Weights   11/25/19 1000 11/26/19 0023 11/28/19 0500  Weight: 92 kg 91.5 kg 92.5 kg   Last Weight  Most recent update: 11/28/2019  6:41 AM   Weight  92.5 kg (203 lb 14.4 oz)           Weight change:    Telemetry    Atrial fib with ventricular ectopy - Personally Reviewed  ECG    None today - Personally Reviewed  Physical Exam   GEN: No  acute distress.   Neck: No  JVD Cardiac: Irregular RR, distant heart sounds Respiratory:     Diffuse wheezing and basilar crackles with decreased breath sounds. GI: Soft, nontender, non-distended, normal  bowel sounds  MS:  Severe edema to waste. Neuro:   Nonfocal  Psych: Oriented and appropriate    Labs    Hematology Recent Labs  Lab 11/25/19 1004  WBC 7.0  RBC 3.86*  HGB 9.7*  HCT 30.1*  MCV 78.0*  MCH 25.1*  MCHC 32.2  RDW 21.6*  PLT 313    Chemistry Recent Labs  Lab 11/25/19 1004 11/26/19 0359 11/26/19 1402 11/27/19 0503 11/28/19 0345  NA 134*   < > 134* 134* 133*  K 5.2*   < > 4.3 3.7 3.9  CL 99   < > 98 98 98  CO2 23   < > 25 24 25   GLUCOSE 111*   < > 134* 113* 112*  BUN 72*   < > 70* 65* 61*  CREATININE 3.40*   < > 3.16* 2.78* 2.81*  CALCIUM 8.9   < > 8.5* 8.5* 8.3*  PROT 6.9  --   --   --   --   ALBUMIN 3.3*  --   --   --   --   AST 37  --   --   --   --   ALT 23  --   --   --   --  ALKPHOS 139*  --   --   --   --   BILITOT 1.9*  --   --   --   --   GFRNONAA 17*   < > 18* 21* 21*  GFRAA 19*   < > 21* 25* 24*  ANIONGAP 12   < > 11 12 10    < > = values in this interval not displayed.    Magnesium  Date Value Ref Range Status  11/26/2019 2.4 1.7 - 2.4 mg/dL Final    Comment:    Performed at [facility], [place] [address] [place], [place] [address]  11/21/2019 2.1 1.7 - 2.4 mg/dL Final    Comment:    Performed at [facility] [address] [place], [place] [address]  11/20/2019 2.0 1.7 - 2.4 mg/dL Final    Comment:    Performed at [facility] [address], [place], [place] [address]    High Sensitivity Troponin:   Recent Labs  Lab 11/15/19 1318 11/15/19 1430  TROPONINIHS 7 8      BNP Recent Labs  Lab 11/25/19 1004  BNP 2,757.9*    Urinalysis    Component Value Date/Time   COLORURINE YELLOW 11/26/2019 1047   APPEARANCEUR HAZY (A) 11/26/2019 1047   LABSPEC 1.010 11/26/2019 1047   PHURINE 5.0 11/26/2019 1047   GLUCOSEU NEGATIVE 11/26/2019 1047   HGBUR LARGE (A) 11/26/2019 1047   BILIRUBINUR NEGATIVE 11/26/2019 1047   KETONESUR NEGATIVE 11/26/2019 1047   PROTEINUR 30 (A) 11/26/2019 1047   UROBILINOGEN 0.2  03/23/2013 1740   NITRITE NEGATIVE 11/26/2019 1047   LEUKOCYTESUR LARGE (A) 11/26/2019 1047     Radiology    DG Chest 2 View  Result Date: 11/25/2019 CLINICAL DATA:  CHF EXAM: CHEST - 2 VIEW COMPARISON:  Ten days ago FINDINGS: Cardiomegaly and vascular pedicle widening that is stable. No change in mild interstitial coarsening with fissure thickening. No visible effusion or pneumothorax IMPRESSION: Cardiomegaly with vascular congestion or mild edema. Electronically Signed   By: Monte Fantasia M.D.   On: 11/25/2019 10:46     Cardiac Studies   ECHO:  09/29/2019  1. Left ventricular ejection fraction, by visual estimation, is 25 to  30%. The left ventricle has severely decreased function. There is mildly  increased left ventricular hypertrophy.  2. Left ventricular diastolic function could not be evaluated.  3. Severely dilated left ventricular internal cavity size.  4. The left ventricle demonstrates global hypokinesis.  5. Global right ventricle has severely reduced systolic function.The  right ventricular size is mildly enlarged.  6. Left atrial size was severely dilated.  7. The interatrial septum is aneurysmal.  8. Right atrial size was severely dilated.  9. The mitral valve is normal in structure. Moderate mitral valve  regurgitation. No evidence of mitral stenosis.  10. The tricuspid valve is normal in structure. Tricuspid valve  regurgitation is mild.  11. The aortic valve is tricuspid. Aortic valve regurgitation is mild.  Mild to moderate aortic valve sclerosis/calcification without any evidence  of aortic stenosis.  12. The pulmonic valve was normal in structure. Pulmonic valve  regurgitation is mild.  13. Moderately elevated pulmonary artery systolic pressure.  14. The inferior vena cava is dilated in size with <50% respiratory  variability, suggesting right atrial pressure of 15 mmHg.  15. Severe global reduction in LV systolic function; mild LVH; 4 chamber   enlargement; severe RV dysfunction; mild AI; moderate MR; mild TR;  moderate  pulmonary hypertension.    Patient Profile     79 y.o. male w/ hx S-D-CHF, atrial fibrillation, CAD, previous V fib arrest and ischemic CM. Hx PAD: R external iliac artery occlusion with severe infrainguinal arterial occlusive disease and peroneal runoff only. L SFA occlusion with diffuse infrainguinal arterial occlusive disease.  Admit 03/12-03/18 w/ PAD>>needs L>R fem-fem bpg & L leg stent, CHF>>d/c on torsemide 100 mg bid, Kdur 20 meq bid, Cr 1.97, consider ICD as outpt  Admitted 03/22 from the office with CHF exacerbation.   D/c wt 10/09/2019 159 lbs Wt 02/23 176 lbs Wt 03/01 180 lbs>>d/c wt 200 lbs Admit wt 203 lbs  Assessment & Plan    Acute on chronic S-D-CHF:      Intake and output is incomplete.  Lasix GTT increased yesterday.  Weight is up not down.   Needs central line for probable inotropic therapy.  Might ultimately need dialysis for volume management.  I have discussed with Dr. Haroldine Laws and he plans right heart cath and central line access.  The Advanced HF Team will consult.    Acute on chronic kidney dz:  Creat is stable today.  Improved from admission but not falling further.  I reviewed his right heart cath results and reviewed the images from his echo earlier this year.  Significant RV dysfunction.  Baseline renal dysfunction exacerbated RV dysfunction, diastolic and systolic LV dysfunction.  Likely to continue to flounder with volume management and continued renal dysfunction without advanced therapies or dialysis.     PAD:  Plan revascularization when he is improved.    CAD:  History of occluded dominant circ not amenable to PCI.  Continue medical management.    Ronnell Guadalajara , PA-C 9:32 AM 11/28/2019

## 2019-11-28 NOTE — Progress Notes (Signed)
Patient came from cath lab alert and oriented, c/o to R foot, bleeding to cath site, MD aware. Pressure dressing applied, pain med given and Will continue to monitor the patient.

## 2019-11-28 NOTE — Interval H&P Note (Signed)
History and Physical Interval Note:  11/28/2019 3:47 PM  [name]  has presented today for surgery, with the diagnosis of Congestive heart failure.  The various methods of treatment have been discussed with the patient and family. After consideration of risks, benefits and other options for treatment, the patient has consented to  Procedure(s): RIGHT HEART CATH (N/A) and central line placement as a surgical intervention.  The patient's history has been reviewed, patient examined, no change in status, stable for surgery.  I have reviewed the patient's chart and labs.  Questions were answered to the patient's satisfaction.     Logan White

## 2019-11-29 LAB — URINE CULTURE: Culture: >=100000 — AB

## 2019-11-29 LAB — BASIC METABOLIC PANEL
Anion gap: 10 (ref 5–15)
BUN: 59 mg/dL — ABNORMAL HIGH (ref 8–23)
CO2: 26 mmol/L (ref 22–32)
Calcium: 8.2 mg/dL — ABNORMAL LOW (ref 8.9–10.3)
Chloride: 98 mmol/L (ref 98–111)
Creatinine, Ser: 2.21 mg/dL — ABNORMAL HIGH (ref 0.61–1.24)
GFR calc Af Amer: 32 mL/min — ABNORMAL LOW (ref >60)
GFR calc non Af Amer: 28 mL/min — ABNORMAL LOW (ref >60)
Glucose, Bld: 143 mg/dL — ABNORMAL HIGH (ref 70–99)
Potassium: 4 mmol/L (ref 3.5–5.1)
Sodium: 134 mmol/L — ABNORMAL LOW (ref 135–145)

## 2019-11-29 LAB — COOXEMETRY PANEL
Carboxyhemoglobin: 1.8 % — ABNORMAL HIGH (ref 0.5–1.5)
Methemoglobin: 1.2 % (ref 0.0–1.5)
O2 Saturation: 64.9 %
Total hemoglobin: 11.2 g/dL — ABNORMAL LOW (ref 12.0–16.0)

## 2019-11-29 MED ORDER — METOLAZONE 5 MG PO TABS
5.0000 mg | ORAL_TABLET | Freq: Two times a day (BID) | ORAL | Status: DC
  Administered 2019-11-29 – 2019-12-02 (×7): 5 mg via ORAL
  Filled 2019-11-29 (×7): qty 1

## 2019-11-29 MED ORDER — TRAZODONE HCL 50 MG PO TABS
50.0000 mg | ORAL_TABLET | Freq: Once | ORAL | Status: AC
  Administered 2019-11-29: 50 mg via ORAL
  Filled 2019-11-29: qty 1

## 2019-11-29 MED ORDER — BENZONATATE 100 MG PO CAPS
100.0000 mg | ORAL_CAPSULE | Freq: Three times a day (TID) | ORAL | Status: AC
  Administered 2019-11-29 (×3): 100 mg via ORAL
  Filled 2019-11-29 (×4): qty 1

## 2019-11-29 MED FILL — Fentanyl Citrate Preservative Free (PF) Inj 100 MCG/2ML: INTRAMUSCULAR | Qty: 2 | Status: AC

## 2019-11-29 NOTE — Progress Notes (Addendum)
CVP monitoring started and connected, patient connected to progressive monitor, Co-ox lab order placed per protocol and sent to lab. First CVP is 26 as documented in flowsheets.  Patient currently lying in bed, denies any needs at this time, family at bedside.

## 2019-11-29 NOTE — Progress Notes (Addendum)
Advanced Heart Failure Rounding Note  PCP-Cardiologist: Kirk Ruths, MD   Subjective:    Diuresing with lasix drip at 20 mg per hour + metolaZone 5 mg twice a day. I.O not accurate. Weight up 4 pounds?   Complaining of leg pain. Denies SOB.   11/28/19 RA = 23 RV = 58/22 PA = 56/21 (38) PCW = 24 Fick cardiac output/index = 6.4/2.9 PVR = 2.2 WU Ao sat = 95% PA sat = 59%. 60%  Assessment: 1. Severely elevated biventricular filling pressures with equalization of R & L sided diastolic pressures suggestive of restrictive physiology.   Objective:   Weight Range: 94.3 kg Body mass index is 25.63 kg/m.   Vital Signs:   Temp:  [97.4 F (36.3 C)-98.4 F (36.9 C)] 98 F (36.7 C) (03/26 0424) Pulse Rate:  [43-112] 86 (03/26 0924) Resp:  [5-37] 15 (03/26 0924) BP: (85-130)/(46-87) 117/57 (03/26 0924) SpO2:  [0 %-100 %] 99 % (03/26 0924) Weight:  [94.3 kg] 94.3 kg (03/26 0100) Last BM Date: 11/27/19  Weight change: Filed Weights   11/26/19 0023 11/28/19 0500 11/29/19 0100  Weight: 91.5 kg 92.5 kg 94.3 kg    Intake/Output:   Intake/Output Summary (Last 24 hours) at 11/29/2019 1304 Last data filed at 11/29/2019 1200 Gross per 24 hour  Intake 891 ml  Output 1150 ml  Net -259 ml      Physical Exam    General:   No resp difficulty HEENT: Normal Neck: Supple. JVP to jaw  . Carotids 2+ bilat; no bruits. No lymphadenopathy or thyromegaly appreciated. Cor: PMI nondisplaced. Irregular rate & rhythm. No rubs, gallops or murmurs. Lungs: Clear Abdomen: Soft, nontender, nondistended. No hepatosplenomegaly. No bruits or masses. Good bowel sounds. Extremities: No cyanosis, clubbing, rash, R and LLE 2+ extending thighs.  Neuro: Alert & orientedx3, cranial nerves grossly intact. moves all 4 extremities w/o difficulty. Affect pleasant GU: scrota edema    Telemetry  A fib 70-80s    EKG    n/a  Labs    CBC Recent Labs    11/28/19 1625  HGB 8.2*  9.2*  HCT  24.0*  53.6*   Basic Metabolic Panel Recent Labs    11/26/19 1402 11/27/19 0503 11/28/19 0345 11/28/19 0345 11/28/19 1625 11/29/19 0507  NA 134*   < > 133*   < > 140  135 134*  K 4.3   < > 3.9   < > 3.5  3.9 4.0  CL 98   < > 98  --   --  98  CO2 25   < > 25  --   --  26  GLUCOSE 134*   < > 112*  --   --  143*  BUN 70*   < > 61*  --   --  59*  CREATININE 3.16*   < > 2.81*  --   --  2.21*  CALCIUM 8.5*   < > 8.3*  --   --  8.2*  MG 2.4  --   --   --   --   --    < > = values in this interval not displayed.   Liver Function Tests No results for input(s): AST, ALT, ALKPHOS, BILITOT, PROT, ALBUMIN in the last 72 hours. No results for input(s): LIPASE, AMYLASE in the last 72 hours. Cardiac Enzymes No results for input(s): CKTOTAL, CKMB, CKMBINDEX, TROPONINI in the last 72 hours.  BNP: BNP (last 3 results) Recent Labs    11/04/19  1241 11/15/19 1110 11/25/19 1004  BNP 1,655.3* 2,374.7* 2,757.9*    ProBNP (last 3 results) Recent Labs    08/15/19 0949  PROBNP 16,511*     D-Dimer No results for input(s): DDIMER in the last 72 hours. Hemoglobin A1C No results for input(s): HGBA1C in the last 72 hours. Fasting Lipid Panel No results for input(s): CHOL, HDL, LDLCALC, TRIG, CHOLHDL, LDLDIRECT in the last 72 hours. Thyroid Function Tests No results for input(s): TSH, T4TOTAL, T3FREE, THYROIDAB in the last 72 hours.  Invalid input(s): FREET3  Other results:   Imaging    CARDIAC CATHETERIZATION  Result Date: 11/29/2019 Findings: RA = 23 RV = 58/22 PA = 56/21 (38) PCW = 24 Fick cardiac output/index = 6.4/2.9 PVR = 2.2 WU Ao sat = 95% PA sat = 59%. 60% Assessment: 1. Severely elevated biventricular filling pressures with equalization of R & L sided diastolic pressures suggestive of restrictive physiology. Plan: Continue attempts at IV diuresis. Glori Bickers, MD 6:22 PM      Medications:     Scheduled Medications: . apixaban  5 mg Oral BID  . aspirin  81  mg Oral Pre-Cath  . atorvastatin  80 mg Oral q1800  . benzonatate  100 mg Oral TID  . Chlorhexidine Gluconate Cloth  6 each Topical Daily  . diphenhydrAMINE  25 mg Intravenous Once  . ipratropium-albuterol  3 mL Nebulization TID  . isosorbide-hydrALAZINE  1 tablet Oral TID  . metolazone  5 mg Oral BID  . mometasone-formoterol  2 puff Inhalation BID  . pantoprazole  40 mg Oral BID  . sodium chloride flush  3 mL Intravenous Q12H  . sodium chloride flush  3 mL Intravenous Q12H     Infusions: . sodium chloride    . sodium chloride    . sodium chloride    . furosemide (LASIX) infusion 20 mg/hr (11/29/19 0208)     PRN Medications:  sodium chloride, sodium chloride, acetaminophen, albuterol, camphor-menthol, nitroGLYCERIN, ondansetron (ZOFRAN) IV, sodium chloride flush, sodium chloride flush    Assessment/Plan  1. Acute on Chronic Biventricular CHF  - known ICM dating back to 2013.  - Echo 1/21 showed LVEF 25-30%, global hypokinesis, mild LVH, RV mildly enlarged w/ severely reduced systolic function. Moderate MR. Mild TR. Severe bi atrial enlargement. - Admitted w/ NYHA IIIb symptoms and marked volume overload w/ poor urinary response to high dose IV lasix and worsening renal function, concerning for low output.  [place] on 3/25 showed elevated biventricular filling pressures and with equalization of R &L sided diastolic pressures and preserved CO consistent with restrictive physiology.  - Diuresing with lasix drip at 20 mg per hour + metolazone 5 mg twice a day.  - Will hold ? blocker     2. AKI on CKD IIIb/IV:  - suspect cardiorenal/low output Creatinine baseline 1.7-1.9  Todays creatinine 2.21    3. CAD:  - prior LAD and LCx stenting - no CP - no ASA due to apixaban - continue high dose statin - hold ? blocker given concerns for low output   4. PAD w/ Claudication  - known R external iliac artery occlusion with severe infrainguinal arterial occlusive disease and  peroneal runoff only. L SFA occlusion with diffuse infrainguinal arterial occlusive disease, being followed by [facility] and awaiting surgical revascularization once medically cleared.   Needs Palliative Care consult for [place]  Set up  CVP  Length of Stay: 4  Amy Clegg, NP  11/29/2019, 1:04 PM  Advanced Heart Failure  Team Pager [phone] (M-F; [place])  Please contact [facility] for night-coverage after hours (4p -7a ) and weekends on [email or web address]  Patient seen and examined with the above-signed Advanced Practice Provider and/or Housestaff. I personally reviewed laboratory data, imaging studies and relevant notes. I independently examined the patient and formulated the important aspects of the plan. I have edited the note to reflect any of my changes or salient points. I have personally discussed the plan with the patient and/or family.  Remains markedly volume overloaded. Co-ox ok but diuresis remains sluggish despite lasix gtt at 20. Cath numbers suggestive of restrictive physiology. Also c/o ongoing leg pain  Creatinine worse   On exam  RIJ cath jvp to jaw Cor RRR Lungs clear Ab distended Ext trace edema  Continues with significant cardiorenal syndrome with severe persistent volume overload and AKI despite high-dose lasix. Will continue attempts at diuresis but if not responding may need to shift towards Palliative Care.   Glori Bickers, MD  5:53 PM

## 2019-11-30 LAB — BASIC METABOLIC PANEL
Anion gap: 12 (ref 5–15)
Anion gap: 12 (ref 5–15)
BUN: 56 mg/dL — ABNORMAL HIGH (ref 8–23)
BUN: 56 mg/dL — ABNORMAL HIGH (ref 8–23)
CO2: 29 mmol/L (ref 22–32)
CO2: 30 mmol/L (ref 22–32)
Calcium: 8.5 mg/dL — ABNORMAL LOW (ref 8.9–10.3)
Calcium: 8.4 mg/dL — ABNORMAL LOW (ref 8.9–10.3)
Chloride: 93 mmol/L — ABNORMAL LOW (ref 98–111)
Chloride: 91 mmol/L — ABNORMAL LOW (ref 98–111)
Creatinine, Ser: 2.29 mg/dL — ABNORMAL HIGH (ref 0.61–1.24)
Creatinine, Ser: 2.13 mg/dL — ABNORMAL HIGH (ref 0.61–1.24)
GFR calc Af Amer: 31 mL/min — ABNORMAL LOW (ref >60)
GFR calc Af Amer: 34 mL/min — ABNORMAL LOW (ref >60)
GFR calc non Af Amer: 27 mL/min — ABNORMAL LOW (ref >60)
GFR calc non Af Amer: 29 mL/min — ABNORMAL LOW (ref >60)
Glucose, Bld: 140 mg/dL — ABNORMAL HIGH (ref 70–99)
Glucose, Bld: 171 mg/dL — ABNORMAL HIGH (ref 70–99)
Potassium: 2.9 mmol/L — ABNORMAL LOW (ref 3.5–5.1)
Potassium: 3.1 mmol/L — ABNORMAL LOW (ref 3.5–5.1)
Sodium: 134 mmol/L — ABNORMAL LOW (ref 135–145)
Sodium: 133 mmol/L — ABNORMAL LOW (ref 135–145)

## 2019-11-30 LAB — COOXEMETRY PANEL
Carboxyhemoglobin: 1.9 % — ABNORMAL HIGH (ref 0.5–1.5)
Methemoglobin: 1.2 % (ref 0.0–1.5)
O2 Saturation: 57.7 %
Total hemoglobin: 9.7 g/dL — ABNORMAL LOW (ref 12.0–16.0)

## 2019-11-30 LAB — MAGNESIUM: Magnesium: 2.2 mg/dL (ref 1.7–2.4)

## 2019-11-30 MED ORDER — POTASSIUM CHLORIDE CRYS ER 20 MEQ PO TBCR
40.0000 meq | EXTENDED_RELEASE_TABLET | ORAL | Status: AC
  Administered 2019-11-30 (×2): 40 meq via ORAL
  Filled 2019-11-30 (×2): qty 2

## 2019-11-30 MED ORDER — POTASSIUM CHLORIDE CRYS ER 20 MEQ PO TBCR
40.0000 meq | EXTENDED_RELEASE_TABLET | Freq: Once | ORAL | Status: AC
  Administered 2019-11-30: 40 meq via ORAL
  Filled 2019-11-30: qty 2

## 2019-11-30 MED ORDER — IPRATROPIUM-ALBUTEROL 0.5-2.5 (3) MG/3ML IN SOLN: 3.0000 mL | Freq: Four times a day (QID) | RESPIRATORY_TRACT | Status: DC | PRN

## 2019-11-30 MED ORDER — SPIRONOLACTONE 12.5 MG HALF TABLET
12.5000 mg | ORAL_TABLET | Freq: Every day | ORAL | Status: DC
  Administered 2019-11-30: 12.5 mg via ORAL
  Filled 2019-11-30: qty 1

## 2019-11-30 MED ORDER — POTASSIUM CHLORIDE CRYS ER 20 MEQ PO TBCR
40.0000 meq | EXTENDED_RELEASE_TABLET | Freq: Two times a day (BID) | ORAL | Status: AC
  Administered 2019-11-30 – 2019-12-02 (×6): 40 meq via ORAL
  Filled 2019-11-30 (×6): qty 2

## 2019-11-30 MED ORDER — SPIRONOLACTONE 25 MG PO TABS
25.0000 mg | ORAL_TABLET | Freq: Every day | ORAL | Status: DC
  Administered 2019-12-01 – 2019-12-05 (×5): 25 mg via ORAL
  Filled 2019-11-30 (×5): qty 1

## 2019-11-30 MED ORDER — TRAMADOL HCL 50 MG PO TABS
100.0000 mg | ORAL_TABLET | Freq: Three times a day (TID) | ORAL | Status: DC | PRN
  Administered 2019-11-30 – 2019-12-05 (×6): 100 mg via ORAL
  Filled 2019-11-30 (×6): qty 2

## 2019-11-30 NOTE — Progress Notes (Addendum)
Advanced Heart Failure Rounding Note  PCP-Cardiologist: Kirk Ruths, MD   Subjective:    Diuresing with lasix drip at 20 mg per hour + metolazone 5 mg twice a day. -4.5L overnight. Weight down 6 pounds.  Co-ox 58%. Creatinine stable 2.2-2.3. K 2.9   CVP remains 22  Breathing is better. No orthopnea or PND.   Still c/o of severe bilateral LE rest pain not ameliorated by tylenol  11/28/19 RA = 23 RV = 58/22 PA = 56/21 (38) PCW = 24 Fick cardiac output/index = 6.4/2.9 PVR = 2.2 WU Ao sat = 95% PA sat = 59%. 60%  Assessment: 1. Severely elevated biventricular filling pressures with equalization of R & L sided diastolic pressures suggestive of restrictive physiology.   Objective:   Weight Range: 91.4 kg Body mass index is 24.85 kg/m.   Vital Signs:   Temp:  [97.7 F (36.5 C)-98.7 F (37.1 C)] 98.3 F (36.8 C) (03/27 1200) Pulse Rate:  [33-102] 33 (03/27 1200) Resp:  [16-26] 21 (03/27 1200) BP: (106-127)/(62-86) 124/65 (03/27 1200) SpO2:  [96 %-100 %] 96 % (03/27 1200) Weight:  [91.4 kg] 91.4 kg (03/27 0240) Last BM Date: 11/29/19  Weight change: Filed Weights   11/28/19 0500 11/29/19 0100 11/30/19 0240  Weight: 92.5 kg 94.3 kg 91.4 kg    Intake/Output:   Intake/Output Summary (Last 24 hours) at 11/30/2019 1344 Last data filed at 11/30/2019 1200 Gross per 24 hour  Intake 1775.22 ml  Output 8000 ml  Net -6224.78 ml      Physical Exam    General:  Weak appearing. No resp difficulty HEENT: normal Neck: supple. RIJ TLC  CVP to ear Carotids 2+ bilat; no bruits. No lymphadenopathy or thryomegaly appreciated. Cor: PMI nondisplaced. irregular rate & rhythm. 2/6 TR Lungs: clear Abdomen: soft, nontender, nondistended. No hepatosplenomegaly. No bruits or masses. Good bowel sounds. Extremities: no cyanosis, clubbing, rash, 2+ edema No overt ischemic changes s/p healed wounds  Neuro: alert & orientedx3, cranial nerves grossly intact. moves all 4 extremities  w/o difficulty. Affect pleasant   Telemetry   AF 70-80s Personally reviewed  Labs    CBC Recent Labs    11/28/19 1625  HGB 8.2*  9.2*  HCT 24.0*  68.3*   Basic Metabolic Panel Recent Labs    11/29/19 0507 11/30/19 0530  NA 134* 134*  K 4.0 2.9*  CL 98 93*  CO2 26 29  GLUCOSE 143* 140*  BUN 59* 56*  CREATININE 2.21* 2.29*  CALCIUM 8.2* 8.5*   Liver Function Tests No results for input(s): AST, ALT, ALKPHOS, BILITOT, PROT, ALBUMIN in the last 72 hours. No results for input(s): LIPASE, AMYLASE in the last 72 hours. Cardiac Enzymes No results for input(s): CKTOTAL, CKMB, CKMBINDEX, TROPONINI in the last 72 hours.  BNP: BNP (last 3 results) Recent Labs    11/04/19 1241 11/15/19 1110 11/25/19 1004  BNP 1,655.3* 2,374.7* 2,757.9*    ProBNP (last 3 results) Recent Labs    08/15/19 0949  PROBNP 16,511*     D-Dimer No results for input(s): DDIMER in the last 72 hours. Hemoglobin A1C No results for input(s): HGBA1C in the last 72 hours. Fasting Lipid Panel No results for input(s): CHOL, HDL, LDLCALC, TRIG, CHOLHDL, LDLDIRECT in the last 72 hours. Thyroid Function Tests No results for input(s): TSH, T4TOTAL, T3FREE, THYROIDAB in the last 72 hours.  Invalid input(s): FREET3  Other results:   Imaging    No results found.   Medications:  Scheduled Medications: . apixaban  5 mg Oral BID  . atorvastatin  80 mg Oral q1800  . Chlorhexidine Gluconate Cloth  6 each Topical Daily  . diphenhydrAMINE  25 mg Intravenous Once  . isosorbide-hydrALAZINE  1 tablet Oral TID  . metolazone  5 mg Oral BID  . mometasone-formoterol  2 puff Inhalation BID  . pantoprazole  40 mg Oral BID  . sodium chloride flush  3 mL Intravenous Q12H  . sodium chloride flush  3 mL Intravenous Q12H    Infusions: . sodium chloride    . sodium chloride    . sodium chloride    . furosemide (LASIX) infusion 20 mg/hr (11/30/19 0808)    PRN Medications: sodium chloride,  sodium chloride, acetaminophen, albuterol, camphor-menthol, ipratropium-albuterol, nitroGLYCERIN, ondansetron (ZOFRAN) IV, sodium chloride flush, sodium chloride flush, traMADol    Assessment/Plan   1. Acute on Chronic Biventricular CHF  - known ICM dating back to 2013.  - Echo 1/21 showed LVEF 25-30%, global hypokinesis, mild LVH, RV mildly enlarged w/ severely reduced systolic function. Moderate MR. Mild TR. Severe bi atrial enlargement. - Admitted w/ NYHA IIIb symptoms and marked volume overload w/ poor urinary response to high dose IV lasix and worsening renal function, concerning for low output.  [place] on 3/25 showed elevated biventricular filling pressures and with equalization of R &L sided diastolic pressures and preserved CO consistent with restrictive physiology.  - Diuresing with lasix drip at 20 mg per hour + metolazone 5 mg twice a day. Renal function relatively stable. Co-ox marginal but ok. No need for inotropes at this juncture  - Hold ? blocker  - Continue IV diuresis - May need Palliative care involvement in setting of worsening HF and multiple recent admits   2. AKI on CKD IIIb/IV:  - suspect cardiorenal/low output - Creatinine baseline 1.7-1.9  - Todays creatinine 2.21 -> 2.29 - Follow daily BMET   3. CAD:  - prior LAD and LCx stenting - no s/s angina - no ASA due to apixaban - continue high dose statin - hold ? blocker given concerns for low output   4. PAD w/ Claudication  - known R external iliac artery occlusion with severe infrainguinal arterial occlusive disease and peroneal runoff only. L SFA occlusion with diffuse infrainguinal arterial occlusive disease, being followed by [facility] and awaiting surgical revascularization once medically cleared. - continues with rest pain. Add tramadol - Appreciate [facility] check-in.   5. Hypokalemia - add spiro while in house - supp K      Length of Stay: [place], MD  11/30/2019, 1:44 PM  Advanced Heart  Failure Team Pager [phone] (M-F; [place])  Please contact [facility] for night-coverage after hours (4p -7a ) and weekends on [email or web address]

## 2019-11-30 NOTE — Progress Notes (Signed)
I am out of town next week.  The patient will require bilateral femoral endarterectomy with a left to right femoral-femoral bypass graft and stenting of the left iliac artery for rest pain, once the patient is cleared medically.  My team will check on the patient status next week.  Please let us know if he is going to be discharged so that we can make the appropriate plans for revascularization.  Annamarie Major

## 2019-12-01 LAB — BASIC METABOLIC PANEL
Anion gap: 11 (ref 5–15)
BUN: 54 mg/dL — ABNORMAL HIGH (ref 8–23)
CO2: 32 mmol/L (ref 22–32)
Calcium: 9.2 mg/dL (ref 8.9–10.3)
Chloride: 90 mmol/L — ABNORMAL LOW (ref 98–111)
Creatinine, Ser: 2.05 mg/dL — ABNORMAL HIGH (ref 0.61–1.24)
GFR calc Af Amer: 35 mL/min — ABNORMAL LOW (ref >60)
GFR calc non Af Amer: 31 mL/min — ABNORMAL LOW (ref >60)
Glucose, Bld: 100 mg/dL — ABNORMAL HIGH (ref 70–99)
Potassium: 3.5 mmol/L (ref 3.5–5.1)
Sodium: 133 mmol/L — ABNORMAL LOW (ref 135–145)

## 2019-12-01 LAB — COOXEMETRY PANEL
Carboxyhemoglobin: 2.5 % — ABNORMAL HIGH (ref 0.5–1.5)
Methemoglobin: 1.2 % (ref 0.0–1.5)
O2 Saturation: 62.2 %
Total hemoglobin: 8 g/dL — ABNORMAL LOW (ref 12.0–16.0)

## 2019-12-01 MED ORDER — POTASSIUM CHLORIDE CRYS ER 20 MEQ PO TBCR
40.0000 meq | EXTENDED_RELEASE_TABLET | Freq: Once | ORAL | Status: AC
  Administered 2019-12-01: 40 meq via ORAL
  Filled 2019-12-01: qty 2

## 2019-12-01 NOTE — Plan of Care (Signed)

## 2019-12-01 NOTE — Progress Notes (Addendum)
Advanced Heart Failure Rounding Note  PCP-Cardiologist: Kirk Ruths, MD   Subjective:    Continues to diurese on lasix drip at 20 mg per hour + metolazone 5 mg twice a day. -6L overnight. Weight down 20 pounds so far. Co-ox 62%. Creatinine improved to 2.0   CVP remains 22 -> 18  Says his breathing is better. No orthopnea or PND. C/o RLE rest pain     11/28/19 RA = 23 RV = 58/22 PA = 56/21 (38) PCW = 24 Fick cardiac output/index = 6.4/2.9 PVR = 2.2 WU Ao sat = 95% PA sat = 59%. 60%  Assessment: 1. Severely elevated biventricular filling pressures with equalization of R & L sided diastolic pressures suggestive of restrictive physiology.   Objective:   Weight Range: 89.1 kg Body mass index is 24.23 kg/m.   Vital Signs:   Temp:  [97.8 F (36.6 C)-99.2 F (37.3 C)] 98 F (36.7 C) (03/28 1224) Pulse Rate:  [87-106] 106 (03/28 0514) Resp:  [13-20] 16 (03/28 1031) BP: (89-126)/(49-94) 119/65 (03/28 1224) SpO2:  [92 %-97 %] 94 % (03/28 1031) Weight:  [89.1 kg] 89.1 kg (03/28 0514) Last BM Date: 11/29/19  Weight change: Filed Weights   11/29/19 0100 11/30/19 0240 12/01/19 0514  Weight: 94.3 kg 91.4 kg 89.1 kg    Intake/Output:   Intake/Output Summary (Last 24 hours) at 12/01/2019 1548 Last data filed at 12/01/2019 1226 Gross per 24 hour  Intake 854.14 ml  Output 6300 ml  Net -5445.86 ml      Physical Exam    General: Lying in bed. No resp difficulty HEENT: normal Neck: supple. JVP to ear. Carotids 2+ bilat; no bruits. No lymphadenopathy or thryomegaly appreciated. Cor: PMI nondisplaced. Irregular rate & rhythm. 2/6 TR Lungs: clear Abdomen: soft, nontender, nondistended. No hepatosplenomegaly. No bruits or masses. Good bowel sounds. Extremities: no cyanosis, clubbing, rash, 2-3+ edema into thighs  Neuro: alert & orientedx3, cranial nerves grossly intact. moves all 4 extremities w/o difficulty. Affect pleasant   Telemetry   AF 90-105 Personally  reviewed  Labs    CBC Recent Labs    11/28/19 1625  HGB 8.2*  9.2*  HCT 24.0*  41.9*   Basic Metabolic Panel Recent Labs    11/30/19 1436 12/01/19 0750  NA 133* 133*  K 3.1* 3.5  CL 91* 90*  CO2 30 32  GLUCOSE 171* 100*  BUN 56* 54*  CREATININE 2.13* 2.05*  CALCIUM 8.4* 9.2  MG 2.2  --    Liver Function Tests No results for input(s): AST, ALT, ALKPHOS, BILITOT, PROT, ALBUMIN in the last 72 hours. No results for input(s): LIPASE, AMYLASE in the last 72 hours. Cardiac Enzymes No results for input(s): CKTOTAL, CKMB, CKMBINDEX, TROPONINI in the last 72 hours.  BNP: BNP (last 3 results) Recent Labs    11/04/19 1241 11/15/19 1110 11/25/19 1004  BNP 1,655.3* 2,374.7* 2,757.9*    ProBNP (last 3 results) Recent Labs    08/15/19 0949  PROBNP 16,511*     D-Dimer No results for input(s): DDIMER in the last 72 hours. Hemoglobin A1C No results for input(s): HGBA1C in the last 72 hours. Fasting Lipid Panel No results for input(s): CHOL, HDL, LDLCALC, TRIG, CHOLHDL, LDLDIRECT in the last 72 hours. Thyroid Function Tests No results for input(s): TSH, T4TOTAL, T3FREE, THYROIDAB in the last 72 hours.  Invalid input(s): FREET3  Other results:   Imaging    No results found.   Medications:     Scheduled Medications: .  apixaban  5 mg Oral BID  . atorvastatin  80 mg Oral q1800  . Chlorhexidine Gluconate Cloth  6 each Topical Daily  . isosorbide-hydrALAZINE  1 tablet Oral TID  . metolazone  5 mg Oral BID  . mometasone-formoterol  2 puff Inhalation BID  . pantoprazole  40 mg Oral BID  . potassium chloride  40 mEq Oral BID  . sodium chloride flush  3 mL Intravenous Q12H  . sodium chloride flush  3 mL Intravenous Q12H  . spironolactone  25 mg Oral Daily    Infusions: . sodium chloride    . sodium chloride    . sodium chloride    . furosemide (LASIX) infusion 20 mg/hr (12/01/19 1035)    PRN Medications: sodium chloride, sodium chloride,  acetaminophen, albuterol, camphor-menthol, ipratropium-albuterol, nitroGLYCERIN, ondansetron (ZOFRAN) IV, sodium chloride flush, sodium chloride flush, traMADol    Assessment/Plan   1. Acute on Chronic Biventricular CHF  - known ICM dating back to 2013.  - Echo 1/21 showed LVEF 25-30%, global hypokinesis, mild LVH, RV mildly enlarged w/ severely reduced systolic function. Moderate MR. Mild TR. Severe bi atrial enlargement. - Admitted w/ NYHA IIIb symptoms and marked volume overload w/ poor urinary response to high dose IV lasix and worsening renal function, concerning for low output.  [place] on 3/25 showed elevated biventricular filling pressures and with equalization of R &L sided diastolic pressures and preserved CO consistent with restrictive physiology.  - Diuresing with lasix drip at 20 mg per hour + metolazone 5 mg twice a day. Renal function and co-ox improving with volume removal. Weight down 20 pounds. Still markedly volume overloaded. No need for inotropes at this juncture  - Hold ? blocker  - Continue IV diuresis - May need Palliative care involvement in setting of worsening HF and multiple recent admits   2. AKI on CKD IIIb/IV:  - suspect cardiorenal/low output - Creatinine baseline 1.7-1.9  - Todays creatinine 2.29 -> 2.13 -> 2.05 - Follow daily BMET  3. CAD:  - prior LAD and LCx stenting - no s/s of angina - no ASA due to apixaban - continue high dose statin - hold ? blocker given ow output   4. PAD w/ Claudication  - known R external iliac artery occlusion with severe infrainguinal arterial occlusive disease and peroneal runoff only. L SFA occlusion with diffuse infrainguinal arterial occlusive disease, being followed by [facility] and awaiting surgical revascularization once medically cleared. - continues with rest pain. Pain improved with tramadol  - Appreciate [facility] check-in.   5. Hypokalemia - adder spiro while in house - supp K today  6. Chronic AF - rate up a bit  today off b-blocker.  - will follow. May need amio for rate control if BP remains soft - continue apixaban. No bleeding    Length of Stay: [place], MD  12/01/2019, 3:48 PM  Advanced Heart Failure Team Pager [phone] (M-F; 7a - 4p)  Please contact [facility] for night-coverage after hours (4p -7a ) and weekends on [email or web address]

## 2019-12-01 NOTE — Progress Notes (Signed)
   VASCULAR SURGERY ASSESSMENT & PLAN:   PERIPHERAL VASCULAR DISEASE: Continues to complain of rest pain of the right foot.  Once he is cleared from a medical standpoint we can try to schedule his surgery.  Dr. Trula Slade plans on a left iliac stent, bilateral femoral endarterectomies, and a left to right femorofemoral bypass.  This is planned for the week of 4/5-4/9.  Vascular surgery will check back later next week to see how he is doing from a medical standpoint.  SUBJECTIVE:   Complains of rest pain right foot.  PHYSICAL EXAM:   Vitals:   12/01/19 0400 12/01/19 0428 12/01/19 0514 12/01/19 0525  BP: (!) 89/49 (!) 115/94 100/65   Pulse:   (!) 106   Resp: 18 18 19 19   Temp:   97.8 F (36.6 C)   TempSrc:   Oral   SpO2:   97%   Weight:   89.1 kg   Height:       Both feet have adequate temperature.  LABS:   Lab Results  Component Value Date   WBC 7.0 11/25/2019   HGB 9.2 (L) 11/28/2019   HGB 8.2 (L) 11/28/2019   HCT 27.0 (L) 11/28/2019   HCT 24.0 (L) 11/28/2019   MCV 78.0 (L) 11/25/2019   PLT 313 11/25/2019   Lab Results  Component Value Date   CREATININE 2.13 (H) 11/30/2019   Lab Results  Component Value Date   INR 2.5 (H) 11/25/2019    PROBLEM LIST:    Principal Problem:   Acute on chronic combined systolic and diastolic CHF (congestive heart failure) (HCC) Active Problems:   Acute on chronic renal failure (HCC)   Peripheral vascular disease (HCC)   CURRENT MEDS:   . apixaban  5 mg Oral BID  . atorvastatin  80 mg Oral q1800  . Chlorhexidine Gluconate Cloth  6 each Topical Daily  . isosorbide-hydrALAZINE  1 tablet Oral TID  . metolazone  5 mg Oral BID  . mometasone-formoterol  2 puff Inhalation BID  . pantoprazole  40 mg Oral BID  . potassium chloride  40 mEq Oral BID  . sodium chloride flush  3 mL Intravenous Q12H  . sodium chloride flush  3 mL Intravenous Q12H  . spironolactone  25 mg Oral Daily    Deitra Mayo Office:  [phone] 12/01/2019

## 2019-12-02 LAB — FOLATE: Folate: 11.6 ng/mL (ref >5.9)

## 2019-12-02 LAB — CBC
HCT: 23.1 % — ABNORMAL LOW (ref 39.0–52.0)
Hemoglobin: 7.8 g/dL — ABNORMAL LOW (ref 13.0–17.0)
MCH: 25.3 pg — ABNORMAL LOW (ref 26.0–34.0)
MCHC: 33.8 g/dL (ref 30.0–36.0)
MCV: 75 fL — ABNORMAL LOW (ref 80.0–100.0)
Platelets: 224 10*3/uL (ref 150–400)
RBC: 3.08 MIL/uL — ABNORMAL LOW (ref 4.22–5.81)
RDW: 20.1 % — ABNORMAL HIGH (ref 11.5–15.5)
WBC: 9.2 10*3/uL (ref 4.0–10.5)
nRBC: 0.4 % — ABNORMAL HIGH (ref 0.0–0.2)

## 2019-12-02 LAB — RETICULOCYTES
Immature Retic Fract: 27.8 % — ABNORMAL HIGH (ref 2.3–15.9)
RBC.: 3.21 MIL/uL — ABNORMAL LOW (ref 4.22–5.81)
Retic Count, Absolute: 48.8 10*3/uL (ref 19.0–186.0)
Retic Ct Pct: 1.5 % (ref 0.4–3.1)

## 2019-12-02 LAB — FERRITIN: Ferritin: 48 ng/mL (ref 24–336)

## 2019-12-02 LAB — COOXEMETRY PANEL
Carboxyhemoglobin: 2.1 % — ABNORMAL HIGH (ref 0.5–1.5)
Carboxyhemoglobin: 2.4 % — ABNORMAL HIGH (ref 0.5–1.5)
Methemoglobin: 1.1 % (ref 0.0–1.5)
Methemoglobin: 1.1 % (ref 0.0–1.5)
O2 Saturation: 47.6 %
O2 Saturation: 60.4 %
Total hemoglobin: 8.1 g/dL — ABNORMAL LOW (ref 12.0–16.0)
Total hemoglobin: 8.1 g/dL — ABNORMAL LOW (ref 12.0–16.0)

## 2019-12-02 LAB — IRON AND TIBC
Iron: 20 ug/dL — ABNORMAL LOW (ref 45–182)
Saturation Ratios: 5 % — ABNORMAL LOW (ref 17.9–39.5)
TIBC: 393 ug/dL (ref 250–450)
UIBC: 373 ug/dL

## 2019-12-02 LAB — BASIC METABOLIC PANEL
Anion gap: 14 (ref 5–15)
BUN: 55 mg/dL — ABNORMAL HIGH (ref 8–23)
CO2: 34 mmol/L — ABNORMAL HIGH (ref 22–32)
Calcium: 9.2 mg/dL (ref 8.9–10.3)
Chloride: 84 mmol/L — ABNORMAL LOW (ref 98–111)
Creatinine, Ser: 2.06 mg/dL — ABNORMAL HIGH (ref 0.61–1.24)
GFR calc Af Amer: 35 mL/min — ABNORMAL LOW (ref >60)
GFR calc non Af Amer: 30 mL/min — ABNORMAL LOW (ref >60)
Glucose, Bld: 99 mg/dL (ref 70–99)
Potassium: 4.5 mmol/L (ref 3.5–5.1)
Sodium: 132 mmol/L — ABNORMAL LOW (ref 135–145)

## 2019-12-02 LAB — VITAMIN B12: Vitamin B-12: 612 pg/mL (ref 180–914)

## 2019-12-02 MED ORDER — ACETAZOLAMIDE 250 MG PO TABS
250.0000 mg | ORAL_TABLET | Freq: Two times a day (BID) | ORAL | Status: AC
  Administered 2019-12-02 – 2019-12-04 (×4): 250 mg via ORAL
  Filled 2019-12-02 (×4): qty 1

## 2019-12-02 MED ORDER — SODIUM CHLORIDE 0.9 % IV SOLN
1.0000 g | INTRAVENOUS | Status: AC
  Administered 2019-12-02 – 2019-12-04 (×3): 1 g via INTRAVENOUS
  Filled 2019-12-02 (×3): qty 10

## 2019-12-02 MED ORDER — SODIUM CHLORIDE 0.9 % IV SOLN
510.0000 mg | INTRAVENOUS | Status: DC
  Administered 2019-12-02: 510 mg via INTRAVENOUS
  Filled 2019-12-02: qty 17

## 2019-12-02 NOTE — Progress Notes (Signed)
Pt complains of numbness in left foot that has been there for "a couple of days" pt claims he hasn't told anyone.  Foot warm to the touch faint pulse noted, doppler preformed pulse detected.  Pt is not in pain with numbness and "cannot describe the numbness" MD notified Will continue to monitor

## 2019-12-02 NOTE — Plan of Care (Signed)

## 2019-12-02 NOTE — Progress Notes (Addendum)
Advanced Heart Failure Rounding Note  PCP-Cardiologist: Kirk Ruths, MD   Subjective:   Diuresing with lasix drip + metolazone. Negative > 3 liters. Weight down 4 pounds.   Weight on admit 202--->183 pounds.   Denies SOB.   11/28/19 RA = 23 RV = 58/22 PA = 56/21 (38) PCW = 24 Fick cardiac output/index = 6.4/2.9 PVR = 2.2 WU Ao sat = 95% PA sat = 59%. 60%  Assessment: 1. Severely elevated biventricular filling pressures with equalization of R & L sided diastolic pressures suggestive of restrictive physiology.   Objective:   Weight Range: 83.2 kg Body mass index is 22.62 kg/m.   Vital Signs:   Temp:  [97.8 F (36.6 C)-98.3 F (36.8 C)] 98.3 F (36.8 C) (03/29 0821) Pulse Rate:  [67-91] 67 (03/29 0821) Resp:  [14-20] 18 (03/29 0821) BP: (95-119)/(63-92) 115/72 (03/29 0821) SpO2:  [92 %-100 %] 100 % (03/29 0821) Weight:  [83.2 kg-85.1 kg] 83.2 kg (03/29 0348) Last BM Date: 11/29/19  Weight change: Filed Weights   12/01/19 0514 12/01/19 1543 12/02/19 0348  Weight: 89.1 kg 85.1 kg 83.2 kg    Intake/Output:   Intake/Output Summary (Last 24 hours) at 12/02/2019 0957 Last data filed at 12/02/2019 0926 Gross per 24 hour  Intake 1510.07 ml  Output 5900 ml  Net -4389.93 ml      Physical Exam   CVP 14  General: No resp difficulty HEENT: normal Neck: supple. JVP 11-12 . Carotids 2+ bilat; no bruits. No lymphadenopathy or thryomegaly appreciated. RIJ  Cor: PMI nondisplaced. Irregular rate & rhythm. No rubs, gallops or murmurs. Lungs: clear Abdomen: soft, nontender, nondistended. No hepatosplenomegaly. No bruits or masses. Good bowel sounds. Extremities: no cyanosis, clubbing, rash, edema. LLE cool. RLE warm  Neuro: alert & orientedx3, cranial nerves grossly intact. moves all 4 extremities w/o difficulty. Affect pleasant   Telemetry   A fib 80-90s with PVCs personally reviewed  Labs    CBC Recent Labs    12/02/19 0330  WBC 9.2  HGB 7.8*  HCT  23.1*  MCV 75.0*  PLT 297   Basic Metabolic Panel Recent Labs    11/30/19 1436 11/30/19 1436 12/01/19 0750 12/02/19 0330  NA 133*   < > 133* 132*  K 3.1*   < > 3.5 4.5  CL 91*   < > 90* 84*  CO2 30   < > 32 34*  GLUCOSE 171*   < > 100* 99  BUN 56*   < > 54* 55*  CREATININE 2.13*   < > 2.05* 2.06*  CALCIUM 8.4*   < > 9.2 9.2  MG 2.2  --   --   --    < > = values in this interval not displayed.   Liver Function Tests No results for input(s): AST, ALT, ALKPHOS, BILITOT, PROT, ALBUMIN in the last 72 hours. No results for input(s): LIPASE, AMYLASE in the last 72 hours. Cardiac Enzymes No results for input(s): CKTOTAL, CKMB, CKMBINDEX, TROPONINI in the last 72 hours.  BNP: BNP (last 3 results) Recent Labs    11/04/19 1241 11/15/19 1110 11/25/19 1004  BNP 1,655.3* 2,374.7* 2,757.9*    ProBNP (last 3 results) Recent Labs    08/15/19 0949  PROBNP 16,511*     D-Dimer No results for input(s): DDIMER in the last 72 hours. Hemoglobin A1C No results for input(s): HGBA1C in the last 72 hours. Fasting Lipid Panel No results for input(s): CHOL, HDL, LDLCALC, TRIG, CHOLHDL, LDLDIRECT in the  last 72 hours. Thyroid Function Tests No results for input(s): TSH, T4TOTAL, T3FREE, THYROIDAB in the last 72 hours.  Invalid input(s): FREET3  Other results:   Imaging    No results found.   Medications:     Scheduled Medications: . apixaban  5 mg Oral BID  . atorvastatin  80 mg Oral q1800  . Chlorhexidine Gluconate Cloth  6 each Topical Daily  . isosorbide-hydrALAZINE  1 tablet Oral TID  . metolazone  5 mg Oral BID  . mometasone-formoterol  2 puff Inhalation BID  . pantoprazole  40 mg Oral BID  . potassium chloride  40 mEq Oral BID  . sodium chloride flush  3 mL Intravenous Q12H  . sodium chloride flush  3 mL Intravenous Q12H  . spironolactone  25 mg Oral Daily    Infusions: . sodium chloride    . sodium chloride    . sodium chloride    . furosemide (LASIX)  infusion 20 mg/hr (12/01/19 2324)    PRN Medications: sodium chloride, sodium chloride, acetaminophen, albuterol, camphor-menthol, ipratropium-albuterol, nitroGLYCERIN, ondansetron (ZOFRAN) IV, sodium chloride flush, sodium chloride flush, traMADol    Assessment/Plan   1. Acute on Chronic Biventricular CHF  - known ICM dating back to 2013.  - Echo 1/21 showed LVEF 25-30%, global hypokinesis, mild LVH, RV mildly enlarged w/ severely reduced systolic function. Moderate MR. Mild TR. Severe bi atrial enlargement. - Admitted w/ NYHA IIIb symptoms and marked volume overload w/ poor urinary response to high dose IV lasix and worsening renal function, concerning for low output.  [place] on 3/25 showed elevated biventricular filling pressures and with equalization of R &L sided diastolic pressures and preserved CO consistent with restrictive physiology.  - CO-OX 48%. Repeat now. Hgb low.  -CVP 14. Continue lasix drip at 20 mg per hour. Stop metolazone.  -CO2 trending up. Add diamox 250 mg twice a day.  -  Weight down 21 pounds. - Hold ? blocker  - Continue IV diuresis - May need Palliative care involvement in setting of worsening HF and multiple recent admits   2. AKI on CKD IIIb/IV:  - suspect cardiorenal/low output - Creatinine baseline 1.7-1.9  - Todays creatinine 2.29 -> 2.13 -> 2.05->2.06   3. CAD:  - prior LAD and LCx stenting - No chest pain.  - no ASA due to apixaban - continue high dose statin - hold ? blocker given ow output   4. PAD w/ Claudication  - known R external iliac artery occlusion with severe infrainguinal arterial occlusive disease and peroneal runoff only. L SFA occlusion with diffuse infrainguinal arterial occlusive disease, being followed by [facility] and awaiting surgical revascularization once medically cleared. - continues with rest pain. Pain improved with tramadol  - Appreciate [facility] check-in.   From [facility]-->Once he is cleared from a medical standpoint we can try to  schedule his surgery.  Dr. Trula Slade plans on a left iliac stent, bilateral femoral endarterectomies, and a left to right femorofemoral bypass.  This is planned for the week of 4/5-4/9  5. Hypokalemia - K stable 4.5 today.   6. Chronic AF - Rate controlled.  - Rate controlled.  -  May need amio for rate control if BP remains soft - continue apixaban.   7. UTI Urine Culture- E COli Start IV rocephin daily x 3 days.   8. Anemia  -Hgb 7.8. No obvious source.  - Check anemia panel.  - Check FOBT   Length of Stay: [place], NP  12/02/2019, 9:57 AM  Advanced Heart Failure Team Pager [phone] (M-F; 7a - 4p)  Please contact [facility] for night-coverage after hours (4p -7a ) and weekends on [email or web address]  Patient seen and examined with the above-signed Advanced Practice Provider and/or Housestaff. I personally reviewed laboratory data, imaging studies and relevant notes. I independently examined the patient and formulated the important aspects of the plan. I have edited the note to reflect any of my changes or salient points. I have personally discussed the plan with the patient and/or family.   Weight down 29 pounds with IV lasix. Breathing better. Still with some leg pain but improved. Denies orthopnea or PND. CVP 20 -> 14.  Co-ox 60%. Creatinine stable at 2.0.  On exam JVP to ear  Cor IRR Lungs cta Ab soft nt/nd Ext 2+ edema into thighs   Continue IV diuresis. Watch hgb.  Iron stores low. Needs feraheme.   Glori Bickers, MD  6:27 PM

## 2019-12-02 NOTE — Care Management Important Message (Signed)
Important Message  Patient Details  Name: Logan White MRN: [id number] Date of Birth: Sep 09, 1940   Medicare Important Message Given:  Yes     Shelda Altes 12/02/2019, 9:44 AM

## 2019-12-03 LAB — BASIC METABOLIC PANEL
Anion gap: 12 (ref 5–15)
BUN: 54 mg/dL — ABNORMAL HIGH (ref 8–23)
CO2: 35 mmol/L — ABNORMAL HIGH (ref 22–32)
Calcium: 9.4 mg/dL (ref 8.9–10.3)
Chloride: 85 mmol/L — ABNORMAL LOW (ref 98–111)
Creatinine, Ser: 2.03 mg/dL — ABNORMAL HIGH (ref 0.61–1.24)
GFR calc Af Amer: 36 mL/min — ABNORMAL LOW (ref >60)
GFR calc non Af Amer: 31 mL/min — ABNORMAL LOW (ref >60)
Glucose, Bld: 120 mg/dL — ABNORMAL HIGH (ref 70–99)
Potassium: 3.8 mmol/L (ref 3.5–5.1)
Sodium: 132 mmol/L — ABNORMAL LOW (ref 135–145)

## 2019-12-03 LAB — COOXEMETRY PANEL
Carboxyhemoglobin: 2.3 % — ABNORMAL HIGH (ref 0.5–1.5)
Methemoglobin: 1.4 % (ref 0.0–1.5)
O2 Saturation: 58.4 %
Total hemoglobin: 8.4 g/dL — ABNORMAL LOW (ref 12.0–16.0)

## 2019-12-03 LAB — TYPE AND SCREEN
ABO/RH(D): O POS
Antibody Screen: NEGATIVE

## 2019-12-03 MED ORDER — POTASSIUM CHLORIDE CRYS ER 20 MEQ PO TBCR
20.0000 meq | EXTENDED_RELEASE_TABLET | Freq: Once | ORAL | Status: AC
  Administered 2019-12-03: 20 meq via ORAL
  Filled 2019-12-03: qty 1

## 2019-12-03 MED ORDER — POTASSIUM CHLORIDE CRYS ER 20 MEQ PO TBCR
40.0000 meq | EXTENDED_RELEASE_TABLET | Freq: Two times a day (BID) | ORAL | Status: AC
  Administered 2019-12-03 (×2): 40 meq via ORAL
  Filled 2019-12-03 (×2): qty 2

## 2019-12-03 NOTE — Plan of Care (Signed)

## 2019-12-03 NOTE — Progress Notes (Signed)
Pt had 12 beast run of pvcs  md notified  Pt asymptomatic Will continue to monitor

## 2019-12-03 NOTE — Progress Notes (Addendum)
Advanced Heart Failure Rounding Note  PCP-Cardiologist: Kirk Ruths, MD   Subjective:   Diuresing with lasix drip + metolazone. Negative > 4 liters. Weight  Another 8  pounds.   Weight on admit 202--->175 pounds.   Complaining of LLE pain. Denies SOB  11/28/19 RA = 23 RV = 58/22 PA = 56/21 (38) PCW = 24 Fick cardiac output/index = 6.4/2.9 PVR = 2.2 WU Ao sat = 95% PA sat = 59%. 60%  Assessment: 1. Severely elevated biventricular filling pressures with equalization of R & L sided diastolic pressures suggestive of restrictive physiology.   Objective:   Weight Range: 79.4 kg Body mass index is 21.58 kg/m.   Vital Signs:   Temp:  [97.6 F (36.4 C)-99.1 F (37.3 C)] 97.9 F (36.6 C) (03/30 0800) Pulse Rate:  [33-104] 95 (03/30 0041) Resp:  [0-39] 22 (03/30 0310) BP: (98-125)/(60-85) 111/65 (03/30 0800) SpO2:  [83 %-100 %] 98 % (03/30 0800) Weight:  [79.4 kg] 79.4 kg (03/30 0314) Last BM Date: 12/02/19  Weight change: Filed Weights   12/01/19 1543 12/02/19 0348 12/03/19 0314  Weight: 85.1 kg 83.2 kg 79.4 kg    Intake/Output:   Intake/Output Summary (Last 24 hours) at 12/03/2019 0841 Last data filed at 12/03/2019 0700 Gross per 24 hour  Intake 1570.55 ml  Output 6025 ml  Net -4454.45 ml      Physical Exam  CVP 11 General:   No resp difficulty HEENT: normal Neck: supple. JVP 11-12 . Carotids 2+ bilat; no bruits. No lymphadenopathy or thryomegaly appreciated. Cor: PMI nondisplaced. Irregular rate & rhythm. No rubs, gallops or murmurs. Lungs: clear Abdomen: soft, nontender, nondistended. No hepatosplenomegaly. No bruits or masses. Good bowel sounds. Extremities: no cyanosis, clubbing, rash, edema Neuro: alert & orientedx3, cranial nerves grossly intact. moves all 4 extremities w/o difficulty. Affect pleasant    Telemetry    Afib 80-90s PVCs personally reviewed.   Labs    CBC Recent Labs    12/02/19 0330  WBC 9.2  HGB 7.8*  HCT 23.1*   MCV 75.0*  PLT 233   Basic Metabolic Panel Recent Labs    11/30/19 1436 12/01/19 0750 12/02/19 0330 12/03/19 0413  NA 133*   < > 132* 132*  K 3.1*   < > 4.5 3.8  CL 91*   < > 84* 85*  CO2 30   < > 34* 35*  GLUCOSE 171*   < > 99 120*  BUN 56*   < > 55* 54*  CREATININE 2.13*   < > 2.06* 2.03*  CALCIUM 8.4*   < > 9.2 9.4  MG 2.2  --   --   --    < > = values in this interval not displayed.   Liver Function Tests No results for input(s): AST, ALT, ALKPHOS, BILITOT, PROT, ALBUMIN in the last 72 hours. No results for input(s): LIPASE, AMYLASE in the last 72 hours. Cardiac Enzymes No results for input(s): CKTOTAL, CKMB, CKMBINDEX, TROPONINI in the last 72 hours.  BNP: BNP (last 3 results) Recent Labs    11/04/19 1241 11/15/19 1110 11/25/19 1004  BNP 1,655.3* 2,374.7* 2,757.9*    ProBNP (last 3 results) Recent Labs    08/15/19 0949  PROBNP 16,511*     D-Dimer No results for input(s): DDIMER in the last 72 hours. Hemoglobin A1C No results for input(s): HGBA1C in the last 72 hours. Fasting Lipid Panel No results for input(s): CHOL, HDL, LDLCALC, TRIG, CHOLHDL, LDLDIRECT in the last 72  hours. Thyroid Function Tests No results for input(s): TSH, T4TOTAL, T3FREE, THYROIDAB in the last 72 hours.  Invalid input(s): FREET3  Other results:   Imaging    No results found.   Medications:     Scheduled Medications: . acetaZOLAMIDE  250 mg Oral BID  . apixaban  5 mg Oral BID  . atorvastatin  80 mg Oral q1800  . Chlorhexidine Gluconate Cloth  6 each Topical Daily  . isosorbide-hydrALAZINE  1 tablet Oral TID  . mometasone-formoterol  2 puff Inhalation BID  . pantoprazole  40 mg Oral BID  . sodium chloride flush  3 mL Intravenous Q12H  . sodium chloride flush  3 mL Intravenous Q12H  . spironolactone  25 mg Oral Daily    Infusions: . sodium chloride    . sodium chloride    . sodium chloride Stopped (12/02/19 1403)  . cefTRIAXone (ROCEPHIN)  IV 1 g  (12/02/19 1134)  . ferumoxytol 510 mg (12/02/19 2000)  . furosemide (LASIX) infusion 20 mg/hr (12/03/19 0326)    PRN Medications: sodium chloride, sodium chloride, acetaminophen, albuterol, camphor-menthol, ipratropium-albuterol, nitroGLYCERIN, ondansetron (ZOFRAN) IV, sodium chloride flush, sodium chloride flush, traMADol    Assessment/Plan   1. Acute on Chronic Biventricular CHF  - known ICM dating back to 2013.  - Echo 1/21 showed LVEF 25-30%, global hypokinesis, mild LVH, RV mildly enlarged w/ severely reduced systolic function. Moderate MR. Mild TR. Severe bi atrial enlargement. - Admitted w/ NYHA IIIb symptoms and marked volume overload w/ poor urinary response to high dose IV lasix and worsening renal function, concerning for low output.  [place] on 3/25 showed elevated biventricular filling pressures and with equalization of R &L sided diastolic pressures and preserved CO consistent with restrictive physiology.  - CO-OX 58%. -CVP down to 11. Continue lasix drip at 20 mg per hour. Anticipate switching back to torsemide 100 mg twice a day tomorrow if CVP lower.  -CO2 trending up. Continue diamox 250 mg twice a day.  -  Weight down 27 pounds. - Hold ? blocker  - Continue IV diuresis - May need Palliative care involvement in setting of worsening HF and multiple recent admits   2. AKI on CKD IIIb/IV:  - suspect cardiorenal/low output - Creatinine baseline 1.7-1.9  - Todays creatinine 2.29 -> 2.13 -> 2.05->2.06 ->2.03   3. CAD:  - prior LAD and LCx stenting - No chest pain.  - no ASA due to apixaban - continue high dose statin - hold ? blocker given ow output   4. PAD w/ Claudication  - known R external iliac artery occlusion with severe infrainguinal arterial occlusive disease and peroneal runoff only. L SFA occlusion with diffuse infrainguinal arterial occlusive disease, being followed by [facility] and awaiting surgical revascularization once medically cleared. - continues with  rest pain. Pain improved with tramadol  - Appreciate [facility] check-in.   From [facility]-->Once he is cleared from a medical standpoint we can try to schedule his surgery.  Dr. Trula Slade plans on a left iliac stent, bilateral femoral endarterectomies, and a left to right femorofemoral bypass.  This is planned for the week of 4/5-4/9  5. Hypokalemia - K 3.8    6. Chronic AF - Rate controlled.  -  continue apixaban.   7. UTI Urine Culture- E COli Start IV rocephin daily x 3 days.   8. Anemia , Iron Deficient  -Hgb 8.4 on his CO-OX . CBC in am.  - Received Feraheme 3/29.   - Iron Sats low. .  -  Check FOBT  Needs to ambulate. Consult PT.    Length of Stay: [place], NP  12/03/2019, 8:41 AM  Advanced Heart Failure Team Pager [phone] (M-F; 7a - 4p)  Please contact [facility] for night-coverage after hours (4p -7a ) and weekends on [email or web address]  Patient seen and examined with the above-signed Advanced Practice Provider and/or Housestaff. I personally reviewed laboratory data, imaging studies and relevant notes. I independently examined the patient and formulated the important aspects of the plan. I have edited the note to reflect any of my changes or salient points. I have personally discussed the plan with the patient and/or family.  Continues to diurese on IV lasix. Weight down nearly 30 pounds. CVP 11-12. Co-ox 58% Breathing better. No orthopnea or PND. Renal function stable.  LE pain improved.   General:  Frail appearing. No resp difficulty HEENT: normal Neck: supple. JVP to jaw Carotids 2+ bilat; no bruits. No lymphadenopathy or thryomegaly appreciated. Cor: PMI nondisplaced. irregular rate & rhythm. 2/6 TR Lungs: clear Abdomen: soft, nontender, nondistended. No hepatosplenomegaly. No bruits or masses. Good bowel sounds. Extremities: no cyanosis, clubbing, rash, tr edema + surgical scars  Neuro: alert & orientedx3, cranial nerves grossly intact. moves all 4 extremities w/o  difficulty. Affect pleasant  Diuresing well on IV lasix. Co-ox marginal. Still with 1 day more of IV lasix to go. Watch renal function. Remains in chronic AF. No bleeding on apixaban. PAD/rest pain stable/sightly improved. Need to ambulate. Feraheme given yesterday.   Glori Bickers, MD  2:01 PM

## 2019-12-03 NOTE — Evaluation (Signed)
Physical Therapy Evaluation Patient Details Name: Logan White MRN: [id number] DOB: 08-17-1941 Today's Date: 12/03/2019   History of Present Illness  Pt is 79 yo male with PMH including CHF, Afib, CAD, Vfib arres, ischemic cardiomyopathy, and PVD.  Pt was admitted with CHF and PAD with claudication.  Pt is being considered for left iliac stenting, bilateral iliac endarterectomy and left to right femorofemoral bypass once medically stable.  Clinical Impression  Pt admitted with above diagnosis. Pt reluctant to participate with therapy but with encouragement and education was agreeable.  He required min cues for transfer technique and cues for RW use to relief pressure on L LE for pain control.  Pt had mild unsteadiness with gait and fatigued easily.  He was on 3 LPM O2.  Expected to progress well as L LE pain improves. Pt currently with functional limitations due to the deficits listed below (see PT Problem List). Pt will benefit from skilled PT to increase their independence and safety with mobility to allow discharge to the venue listed below.       Follow Up Recommendations Home health PT;Supervision for mobility/OOB    Equipment Recommendations  Rolling walker with 5" wheels    Recommendations for Other Services       Precautions / Restrictions Precautions Precautions: Fall      Mobility  Bed Mobility Overal bed mobility: Needs Assistance Bed Mobility: Supine to Sit     Supine to sit: Min guard;HOB elevated        Transfers Overall transfer level: Needs assistance Equipment used: Rolling walker (2 wheeled) Transfers: Sit to/from Stand Sit to Stand: Min assist;From elevated surface         General transfer comment: cues for safe hand placement  Ambulation/Gait Ambulation/Gait assistance: Min guard Gait Distance (Feet): 16 Feet Assistive device: Rolling walker (2 wheeled) Gait Pattern/deviations: Step-to pattern;Decreased stride length;Decreased stance time -  left Gait velocity: decreased   General Gait Details: cued for step to left pattern and use of walker to relief weight on L LE during stance for pain control - pt reports that decreased pain  Stairs            Wheelchair Mobility    Modified Rankin (Stroke Patients Only)       Balance Overall balance assessment: Needs assistance Sitting-balance support: Feet supported;No upper extremity supported Sitting balance-Leahy Scale: Normal Sitting balance - Comments: Pt wanted to sit EOB to eat dinner - pt demonstrated safe balance   Standing balance support: Bilateral upper extremity supported;During functional activity Standing balance-Leahy Scale: Fair                               Pertinent Vitals/Pain Pain Assessment: Faces Faces Pain Scale: Hurts even more Pain Location: L LE with weight bearing Pain Descriptors / Indicators: Heaviness;Pins and needles;Numbness;Tingling Pain Intervention(s): Limited activity within patient's tolerance;Monitored during session;Repositioned;Relaxation;Other (comment)(cued for RW to relief pressure)    Home Living Family/patient expects to be discharged to:: Private residence Living Arrangements: Spouse/significant other;Children Available Help at Discharge: Family;Available 24 hours/day Type of Home: Apartment Home Access: Level entry     Home Layout: One level Home Equipment: Cane - single point;Shower seat      Prior Function Level of Independence: Independent with assistive device(s)   Gait / Transfers Assistance Needed: Reports ambulates with cane; states can ambulate community distances but tends to use motorized cart at store  ADL's / Homemaking Assistance Needed:  Reports independent with ADLs  Comments: Pt expressed frustrations over not being able to rest and over questions.  He provided some history but PLOF also taken from prior admission.     Hand Dominance        Extremity/Trunk Assessment   Upper  Extremity Assessment Upper Extremity Assessment: Overall WFL for tasks assessed    Lower Extremity Assessment Lower Extremity Assessment: LLE deficits/detail;RLE deficits/detail RLE Deficits / Details: ROM WFL; MMT 5/5 RLE Sensation: decreased light touch LLE Deficits / Details: ROM WFL (Less ankle DF than R); MMT 4/5 ankle DF, 4+/5 knee ext, 4/5 hip LLE Sensation: decreased light touch    Cervical / Trunk Assessment Cervical / Trunk Assessment: Normal  Communication   Communication: No difficulties  Cognition Arousal/Alertness: Awake/alert Behavior During Therapy: Agitated;Flat affect Overall Cognitive Status: Within Functional Limits for tasks assessed                                 General Comments: Pt frustrated during session - reports unable to get any rest - "can I lock that door so no one else can come in"      General Comments General comments (skin integrity, edema, etc.): Pt on 3 L O2.  O2 sats were stable, DOE with ambulation 3/4, HR and BP stable.  Pt on CVP and continuous lasix drip.    Exercises     Assessment/Plan    PT Assessment Patient needs continued PT services  PT Problem List Decreased activity tolerance;Decreased balance;Decreased mobility;Cardiopulmonary status limiting activity;Pain;Impaired sensation;Decreased strength;Decreased safety awareness;Decreased knowledge of precautions;Decreased knowledge of use of DME       PT Treatment Interventions DME instruction;Gait training;Functional mobility training;Therapeutic activities;Therapeutic exercise;Balance training;Neuromuscular re-education;Patient/family education    PT Goals (Current goals can be found in the Care Plan section)  Acute Rehab PT Goals Patient Stated Goal: Get some rest PT Goal Formulation: With patient Time For Goal Achievement: 12/17/19 Potential to Achieve Goals: Good    Frequency Min 3X/week   Barriers to discharge        Co-evaluation                AM-PAC PT "6 Clicks" Mobility  Outcome Measure Help needed turning from your back to your side while in a flat bed without using bedrails?: A Little Help needed moving from lying on your back to sitting on the side of a flat bed without using bedrails?: A Little Help needed moving to and from a bed to a chair (including a wheelchair)?: A Little Help needed standing up from a chair using your arms (e.g., wheelchair or bedside chair)?: A Little Help needed to walk in hospital room?: A Little Help needed climbing 3-5 steps with a railing? : A Little 6 Click Score: 18    End of Session Equipment Utilized During Treatment: Oxygen;Gait belt Activity Tolerance: Patient tolerated treatment well Patient left: with call bell/phone within reach;with bed alarm set;in bed(sitting EOB (declined return to supine, has safe EOB balance, VSS)) Nurse Communication: Mobility status(use of RW with mobility) PT Visit Diagnosis: Difficulty in walking, not elsewhere classified (R26.2);Pain Pain - Right/Left: Left Pain - part of body: Leg    Time: 0981-1914 PT Time Calculation (min) (ACUTE ONLY): 23 min   Charges:   PT Evaluation $PT Eval Moderate Complexity: 1 Mod PT Treatments $Gait Training: 8-22 mins        Logan White, PT Acute Rehab Services Pager [phone]  Logan White [facility] [phone] [facility] [phone]   Logan White 12/03/2019, 6:00 PM

## 2019-12-04 LAB — CBC
HCT: 25.5 % — ABNORMAL LOW (ref 39.0–52.0)
Hemoglobin: 8.5 g/dL — ABNORMAL LOW (ref 13.0–17.0)
MCH: 24.6 pg — ABNORMAL LOW (ref 26.0–34.0)
MCHC: 33.3 g/dL (ref 30.0–36.0)
MCV: 73.9 fL — ABNORMAL LOW (ref 80.0–100.0)
Platelets: 259 10*3/uL (ref 150–400)
RBC: 3.45 MIL/uL — ABNORMAL LOW (ref 4.22–5.81)
RDW: 20.2 % — ABNORMAL HIGH (ref 11.5–15.5)
WBC: 7.6 10*3/uL (ref 4.0–10.5)
nRBC: 0.5 % — ABNORMAL HIGH (ref 0.0–0.2)

## 2019-12-04 LAB — BASIC METABOLIC PANEL
Anion gap: 12 (ref 5–15)
BUN: 54 mg/dL — ABNORMAL HIGH (ref 8–23)
CO2: 36 mmol/L — ABNORMAL HIGH (ref 22–32)
Calcium: 9.3 mg/dL (ref 8.9–10.3)
Chloride: 78 mmol/L — ABNORMAL LOW (ref 98–111)
Creatinine, Ser: 2.14 mg/dL — ABNORMAL HIGH (ref 0.61–1.24)
GFR calc Af Amer: 34 mL/min — ABNORMAL LOW (ref >60)
GFR calc non Af Amer: 29 mL/min — ABNORMAL LOW (ref >60)
Glucose, Bld: 122 mg/dL — ABNORMAL HIGH (ref 70–99)
Potassium: 3.7 mmol/L (ref 3.5–5.1)
Sodium: 126 mmol/L — ABNORMAL LOW (ref 135–145)

## 2019-12-04 LAB — COOXEMETRY PANEL
Carboxyhemoglobin: 1.6 % — ABNORMAL HIGH (ref 0.5–1.5)
Methemoglobin: 0.7 % (ref 0.0–1.5)
O2 Saturation: 55.7 %
Total hemoglobin: 8.3 g/dL — ABNORMAL LOW (ref 12.0–16.0)

## 2019-12-04 LAB — MAGNESIUM: Magnesium: 2 mg/dL (ref 1.7–2.4)

## 2019-12-04 MED ORDER — ACETAZOLAMIDE 250 MG PO TABS
250.0000 mg | ORAL_TABLET | Freq: Once | ORAL | Status: AC
  Administered 2019-12-04: 250 mg via ORAL
  Filled 2019-12-04: qty 1

## 2019-12-04 MED ORDER — SODIUM CHLORIDE 0.9 % IV SOLN: 510.0000 mg | Freq: Once | INTRAVENOUS | Status: DC

## 2019-12-04 MED ORDER — TORSEMIDE 100 MG PO TABS
100.0000 mg | ORAL_TABLET | Freq: Two times a day (BID) | ORAL | Status: DC
  Administered 2019-12-05: 100 mg via ORAL
  Filled 2019-12-04: qty 1

## 2019-12-04 NOTE — Progress Notes (Signed)
PT Cancellation Note  Patient Details Name: Logan White MRN: [id number] DOB: [birth year]   Cancelled Treatment:    Reason Eval/Treat Not Completed: Other (comment).  Pt reports not feeling up to moving yet, asked PT to return later on.  Will re-attempt as time and pt allow.   Ramond Dial 12/04/2019, 1:25 PM   Mee Hives, PT MS Acute Rehab Dept. Number: [place] [facility] [place]

## 2019-12-04 NOTE — Consult Note (Signed)
   [facility] Inpatient Consult   12/04/2019  Logan White Mar 13, 1941 [id number]   Follow up:  Patient with [facility] Medicare for [place] [facility] referral for follow up prior to admission:  Pending status  Call patient's phone in hospital room, patient identified self however began coughing. When patient was able to regain speaking he request a call back at a later time and began coughing.  Plan:  Did follow up with inpatient [facility] team member regarding outreach for post hospital follow up and disposition needs. Will continue to follow progress.  [place] [facility] does not interfere with or replace any services arranged by the inpatient [facility] team.  For questions, please contact:  Natividad Brood, RN BSN [facility] Liaison  [phone] business mobile phone Toll free office [phone]  Fax number: [phone] [email or web address]

## 2019-12-04 NOTE — TOC Initial Note (Signed)
Transition of Care [facility]) - Initial/Assessment Note    Patient Details  Name: Logan White MRN: [id number] Date of Birth: 09-27-1940  Transition of Care [facility]) CM/SW Contact:    Zenon Mayo, RN Phone Number: 12/04/2019, 4:28 PM  Clinical Narrative:                 NCM spoke with patient, he states to call his wife and talk to her about [facility] services.  NCM contacted wife, she states they are with [facility] and would like to continue with [facility]. NCM notified [place] to add HHPT to [facility] services.  She states he has no issues with getting meds and she will be his transportation at discharge.   Expected Discharge Plan: [place] Barriers to Discharge: Continued Medical Work up   Patient Goals and CMS Choice Patient states their goals for this hospitalization and ongoing recovery are:: get better CMS Medicare.gov Compare Post Acute Care list provided to:: Patient Represenative (must comment) Choice offered to / list presented to : Spouse  Expected Discharge Plan and Services Expected Discharge Plan: [place] In-house Referral: NA Discharge Planning Services: CM Consult Post Acute Care Choice: [place] arrangements for the past 2 months: Single Family Home                   DME Agency: NA       HH Arranged: RN, PT, Disease Management [place] Agency: [facility] [place] Date [place] Agency Contacted: 12/04/19 Time [place]: 1627 Representative spoke with at [place]: [place] Arrangements/Services Living arrangements for the past 2 months: [place] with:: Spouse Patient language and need for interpreter reviewed:: Yes Do you feel safe going back to the place where you live?: Yes      Need for Family Participation in Patient Care: Yes (Comment) Care giver support system in place?: Yes (comment) Current home services: DME(has a cane) Criminal Activity/Legal Involvement Pertinent to Current  Situation/Hospitalization: No - Comment as needed  Activities of Daily Living Home Assistive Devices/Equipment: Cane (specify quad or straight) ADL Screening (condition at time of admission) Patient's cognitive ability adequate to safely complete daily activities?: No Is the patient deaf or have difficulty hearing?: No Does the patient have difficulty seeing, even when wearing glasses/contacts?: No Does the patient have difficulty concentrating, remembering, or making decisions?: No Patient able to express need for assistance with ADLs?: Yes Does the patient have difficulty dressing or bathing?: No Independently performs ADLs?: No Communication: Independent Dressing (OT): Needs assistance Is this a change from baseline?: Change from baseline, expected to last >3 days Grooming: Needs assistance Is this a change from baseline?: Change from baseline, expected to last >3 days Feeding: Independent Bathing: Needs assistance Is this a change from baseline?: Pre-admission baseline Toileting: Needs assistance Is this a change from baseline?: Change from baseline, expected to last >3days In/Out Bed: Needs assistance Is this a change from baseline?: Change from baseline, expected to last >3 days Walks in Home: Needs assistance Is this a change from baseline?: Pre-admission baseline Does the patient have difficulty walking or climbing stairs?: Yes Weakness of Legs: Both Weakness of Arms/Hands: None  Permission Sought/Granted                  Emotional Assessment Appearance:: Appears stated age Attitude/Demeanor/Rapport: Engaged Affect (typically observed): Appropriate Orientation: : Oriented to Self, Oriented to Place, Oriented to  Time, Oriented to Situation Alcohol /  Substance Use: Not Applicable Psych Involvement: No (comment)  Admission diagnosis:  Acute respiratory distress [R06.03] CHF (congestive heart failure) (HCC) [I50.9] Claudication (HCC) [I73.9] Leg edema  [R60.0] Acute on chronic combined systolic and diastolic CHF (congestive heart failure) ([place]) [I50.43] Patient Active Problem List   Diagnosis Date Noted  . Acute on chronic combined systolic and diastolic CHF (congestive heart failure) ([place]) 11/25/2019  . Palliative care by specialist   . Goals of care, counseling/discussion   . PVD (peripheral vascular disease) ([place]) 11/15/2019  . Gastritis and gastroduodenitis   . Benign neoplasm of transverse colon   . Microcytic anemia   . Acute blood loss anemia   . Melena   . CHF (congestive heart failure) ([place]) 11/04/2019  . CHF (congestive heart failure), NYHA class IV, acute, systolic ([place]) 48/88/9169  . Persistent atrial fibrillation ([place])   . Acute on chronic systolic CHF (congestive heart failure) ([place]) 06/22/2019  . Acute on chronic systolic (congestive) heart failure ([place]) 06/22/2019  . CAD S/P PCI 10/13/2015  . Cardiomyopathy, new. Etiology apeears to be NICM 10/13/2015  . AAA (abdominal aortic aneurysm) ([place])   . Peripheral vascular disease ([place])   . Carotid artery stenosis   . Hypertensive heart disease   . Bruit 10/24/2014  . Acute on chronic renal failure ([place]) 01/11/2013  . Acute MI, anterolateral wall, initial episode of care ([place]) 12/31/2012  . Cardiac arrest due to underlying cardiac condition ([place]) 12/31/2012  . VF (ventricular fibrillation) ([place]) 12/31/2012  . Abnormal LFTs 12/31/2012  . Acute on chronic renal insufficiency 12/31/2012  . Chronic anticoagulation 12/31/2012  . Hypokalemia 12/31/2012  . Weakness 04/23/2012  . Long term (current) use of anticoagulants 03/19/2012  . CKD (chronic kidney disease), stage III 03/16/2012  . Acute confusion/delerium 03/12/2012  . Delirium 03/12/2012  . Acute systolic CHF (congestive heart failure) ([place]) 03/11/2012  . Septic shock(785.52) 02/27/2012    Class: Acute  . Cardiogenic shock ([place]) 02/25/2012  . Acute renal failure ([place]) 02/25/2012  . Anoxic encephalopathy ([place])  02/25/2012  . Ischemic cardiomyopathy 02/25/2012  . Cardiac arrest ([place]) 02/23/2012  . Acute respiratory failure with hypoxia ([place]) 02/23/2012  . Ventricular fibrillation ([place]) 02/23/2012  . DM (diabetes mellitus) ([place]) 02/23/2012  . Screening for colon cancer 02/10/2011  . Hematemesis 01/13/2011  . Dyspnea 01/13/2011  . RENAL INSUFFICIENCY 08/26/2010  . HYPERPOTASSEMIA 09/15/2009  . TOBACCO ABUSE 08/11/2009  . GLUCOSE INTOLERANCE 10/02/2008  . Hyperlipidemia 10/02/2008  . Essential hypertension 10/02/2008  . Cerebrovascular disease 10/02/2008  . PERIPHERAL VASCULAR DISEASE 10/02/2008  . INGUINAL HERNIA 10/02/2008   PCP:  Patient, No Pcp Per Pharmacy:   [facility], [place] [address] Phone: [phone] Fax: [phone]  [id number] - [place], [place] [address] Phone: [phone] Fax: [phone]  [facility], [place] [address] Phone: [phone] Fax: [phone]     Social Determinants of Health (SDOH) Interventions    Readmission Risk Interventions Readmission Risk Prevention Plan 12/04/2019 11/18/2019 09/30/2019  Transportation Screening Complete Complete Complete  PCP or Specialist Appt within 3-5 Days - - Complete  HRI or [place] - - Complete  Social Work Consult for [place] Planning/Counseling - - Complete  Palliative Care Screening - - Not Applicable  Medication Review (RN Care Manager) Complete Complete Complete  PCP or Specialist appointment within 3-5 days of  discharge Complete Complete -  HRI or Home Care Consult Complete Complete -  SW Recovery Care/Counseling Consult Complete Complete -  Palliative Care Screening Not Applicable Complete -  [place] Not Applicable Not Applicable -  Some recent data might be hidden

## 2019-12-04 NOTE — Progress Notes (Addendum)
Advanced Heart Failure Rounding Note  PCP-Cardiologist: Kirk Ruths, MD   Subjective:   Diuresing with lasix drip + metolazone. Negative >3.5 liters. Weight  Down another 15 pounds.   Weight on admit 202--->160pounds.   Denies SOB.   11/28/19 RA = 23 RV = 58/22 PA = 56/21 (38) PCW = 24 Fick cardiac output/index = 6.4/2.9 PVR = 2.2 WU Ao sat = 95% PA sat = 59%. 60%  Assessment: 1. Severely elevated biventricular filling pressures with equalization of R & L sided diastolic pressures suggestive of restrictive physiology.   Objective:   Weight Range: 72.9 kg Body mass index is 19.83 kg/m.   Vital Signs:   Temp:  [97.6 F (36.4 C)-98.3 F (36.8 C)] 98.3 F (36.8 C) (03/31 1138) Pulse Rate:  [76-98] 91 (03/31 1138) Resp:  [0-31] 18 (03/31 1138) BP: (92-115)/(59-74) 109/63 (03/31 1138) SpO2:  [93 %-100 %] 96 % (03/31 1138) Weight:  [72.9 kg] 72.9 kg (03/31 0410) Last BM Date: 12/02/19  Weight change: Filed Weights   12/02/19 0348 12/03/19 0314 12/04/19 0410  Weight: 83.2 kg 79.4 kg 72.9 kg    Intake/Output:   Intake/Output Summary (Last 24 hours) at 12/04/2019 1216 Last data filed at 12/04/2019 0935 Gross per 24 hour  Intake 1401.99 ml  Output 5675 ml  Net -4273.01 ml      Physical Exam  CVP 7-8 General:   No resp difficulty HEENT: normal anicteric Neck: supple. no JVD. Carotids 2+ bilat; no bruits. No lymphadenopathy or thryomegaly appreciated. Cor: PMI nondisplaced. Irregular rate & rhythm. No rubs, gallops or murmurs. Lungs: clear no wheeze Abdomen: soft, nontender, nondistended. No hepatosplenomegaly. No bruits or masses. Good bowel sounds. Extremities: no cyanosis, clubbing, rash, edema. LUE PICC Neuro: alert & orientedx3, cranial nerves grossly intact. moves all 4 extremities w/o difficulty. Affect pleasant + surgical scars    Telemetry   A fib 80-90s Personally reviewed   Labs    CBC Recent Labs    12/02/19 0330 12/04/19 0314   WBC 9.2 7.6  HGB 7.8* 8.5*  HCT 23.1* 25.5*  MCV 75.0* 73.9*  PLT 224 081   Basic Metabolic Panel Recent Labs    12/03/19 0413 12/04/19 0314  NA 132* 126*  K 3.8 3.7  CL 85* 78*  CO2 35* 36*  GLUCOSE 120* 122*  BUN 54* 54*  CREATININE 2.03* 2.14*  CALCIUM 9.4 9.3  MG  --  2.0   Liver Function Tests No results for input(s): AST, ALT, ALKPHOS, BILITOT, PROT, ALBUMIN in the last 72 hours. No results for input(s): LIPASE, AMYLASE in the last 72 hours. Cardiac Enzymes No results for input(s): CKTOTAL, CKMB, CKMBINDEX, TROPONINI in the last 72 hours.  BNP: BNP (last 3 results) Recent Labs    11/04/19 1241 11/15/19 1110 11/25/19 1004  BNP 1,655.3* 2,374.7* 2,757.9*    ProBNP (last 3 results) Recent Labs    08/15/19 0949  PROBNP 16,511*     D-Dimer No results for input(s): DDIMER in the last 72 hours. Hemoglobin A1C No results for input(s): HGBA1C in the last 72 hours. Fasting Lipid Panel No results for input(s): CHOL, HDL, LDLCALC, TRIG, CHOLHDL, LDLDIRECT in the last 72 hours. Thyroid Function Tests No results for input(s): TSH, T4TOTAL, T3FREE, THYROIDAB in the last 72 hours.  Invalid input(s): FREET3  Other results:   Imaging    No results found.   Medications:     Scheduled Medications: . apixaban  5 mg Oral BID  . atorvastatin  80 mg Oral q1800  . Chlorhexidine Gluconate Cloth  6 each Topical Daily  . isosorbide-hydrALAZINE  1 tablet Oral TID  . mometasone-formoterol  2 puff Inhalation BID  . pantoprazole  40 mg Oral BID  . sodium chloride flush  3 mL Intravenous Q12H  . sodium chloride flush  3 mL Intravenous Q12H  . spironolactone  25 mg Oral Daily    Infusions: . sodium chloride    . sodium chloride    . sodium chloride Stopped (12/02/19 1403)  . cefTRIAXone (ROCEPHIN)  IV 1 g (12/04/19 1150)  . ferumoxytol 510 mg (12/02/19 2000)  . furosemide (LASIX) infusion 20 mg/hr (12/04/19 0729)    PRN Medications: sodium chloride,  sodium chloride, acetaminophen, albuterol, camphor-menthol, ipratropium-albuterol, nitroGLYCERIN, ondansetron (ZOFRAN) IV, sodium chloride flush, sodium chloride flush, traMADol    Assessment/Plan   1. Acute on Chronic Biventricular CHF  - known ICM dating back to 2013.  - Echo 1/21 showed LVEF 25-30%, global hypokinesis, mild LVH, RV mildly enlarged w/ severely reduced systolic function. Moderate MR. Mild TR. Severe bi atrial enlargement. - Admitted w/ NYHA IIIb symptoms and marked volume overload w/ poor urinary response to high dose IV lasix and worsening renal function, concerning for low output.  [place] on 3/25 showed elevated biventricular filling pressures and with equalization of R &L sided diastolic pressures and preserved CO consistent with restrictive physiology.  - CO-OX 55%. Stop lasix drip. Start torsemide torsemide 100 mg twice a day tomorrow  -CO2 trending up. Continue diamox 250 mg twice a day. Last dose of diamox this afternoon.   -  Weight down ~ 40 pounds. - Hold ? blocker  - May need Palliative care involvement in setting of worsening HF and multiple recent admits   2. 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 CAD:  - prior LAD and LCx stenting - No s/s ischemia  - no ASA due to apixaban - continue high dose statin - hold ? blocker given ow output   4. PAD w/ Claudication  - known R external iliac artery occlusion with severe infrainguinal arterial occlusive disease and peroneal runoff only. L SFA occlusion with diffuse infrainguinal arterial occlusive disease, being followed by [facility] and awaiting surgical revascularization once medically cleared. - continues with rest pain. Pain improved with tramadol  - Appreciate [facility] check-in.   From [facility]-->Once he is cleared from a medical standpoint we can try to schedule his surgery.  Dr. Trula Slade plans on a left iliac stent, bilateral femoral  endarterectomies, and a left to right femorofemoral bypass.  This is planned for the week of 4/5-4/9  5. Hypokalemia - K 3.7    6. Chronic AF - Rate controlled.  -  continue apixaban.   7. UTI Urine Culture- E COli Start IV rocephin daily x 3 days.   8. Anemia , Iron Deficient  -Hgb 8.4 on his CO-OX . CBC in am.  - Received Feraheme 3/29.   - Iron Sats low. .  - Check FOBT  Needs to ambulate. Consult PT.    Length of Stay: [place], NP  12/04/2019, 12:16 PM  Advanced Heart Failure Team Pager [phone] (M-F; 7a - 4p)  Please contact [facility] for night-coverage after hours (4p -7a ) and weekends on [email or web address]  Patient seen and examined with the above-signed Advanced Practice Provider and/or Housestaff. I personally reviewed laboratory data, imaging studies and relevant notes. I independently  examined the patient and formulated the important aspects of the plan. I have edited the note to reflect any of my changes or salient points. I have personally discussed the plan with the patient and/or family.  Weight down 42 pounds total. CVP 7-8. Feels good. Denies SOB, orthopnea or PND. Leg pain improved.   Possible home in am if remains stable. Will have LE revascularization next week.  Glori Bickers, MD  5:11 PM

## 2019-12-05 ENCOUNTER — Other Ambulatory Visit: Payer: Self-pay | Admitting: *Deleted

## 2019-12-05 ENCOUNTER — Other Ambulatory Visit (HOSPITAL_COMMUNITY): Payer: Self-pay | Admitting: *Deleted

## 2019-12-05 DIAGNOSIS — I5022 Chronic systolic (congestive) heart failure: Secondary | ICD-10-CM

## 2019-12-05 DIAGNOSIS — I6523 Occlusion and stenosis of bilateral carotid arteries: Secondary | ICD-10-CM

## 2019-12-05 LAB — BASIC METABOLIC PANEL
Anion gap: 16 — ABNORMAL HIGH (ref 5–15)
BUN: 56 mg/dL — ABNORMAL HIGH (ref 8–23)
CO2: 37 mmol/L — ABNORMAL HIGH (ref 22–32)
Calcium: 9.3 mg/dL (ref 8.9–10.3)
Chloride: 79 mmol/L — ABNORMAL LOW (ref 98–111)
Creatinine, Ser: 2.45 mg/dL — ABNORMAL HIGH (ref 0.61–1.24)
GFR calc Af Amer: 29 mL/min — ABNORMAL LOW (ref >60)
GFR calc non Af Amer: 25 mL/min — ABNORMAL LOW (ref >60)
Glucose, Bld: 109 mg/dL — ABNORMAL HIGH (ref 70–99)
Potassium: 3.3 mmol/L — ABNORMAL LOW (ref 3.5–5.1)
Sodium: 132 mmol/L — ABNORMAL LOW (ref 135–145)

## 2019-12-05 LAB — COOXEMETRY PANEL
Carboxyhemoglobin: 1.9 % — ABNORMAL HIGH (ref 0.5–1.5)
Methemoglobin: 1.2 % (ref 0.0–1.5)
O2 Saturation: 49.8 %
Total hemoglobin: 8.9 g/dL — ABNORMAL LOW (ref 12.0–16.0)

## 2019-12-05 LAB — CBC
HCT: 26.6 % — ABNORMAL LOW (ref 39.0–52.0)
Hemoglobin: 9 g/dL — ABNORMAL LOW (ref 13.0–17.0)
MCH: 25 pg — ABNORMAL LOW (ref 26.0–34.0)
MCHC: 33.8 g/dL (ref 30.0–36.0)
MCV: 73.9 fL — ABNORMAL LOW (ref 80.0–100.0)
Platelets: 298 10*3/uL (ref 150–400)
RBC: 3.6 MIL/uL — ABNORMAL LOW (ref 4.22–5.81)
RDW: 20.3 % — ABNORMAL HIGH (ref 11.5–15.5)
WBC: 8 10*3/uL (ref 4.0–10.5)
nRBC: 0.3 % — ABNORMAL HIGH (ref 0.0–0.2)

## 2019-12-05 MED ORDER — POTASSIUM CHLORIDE CRYS ER 20 MEQ PO TBCR
40.0000 meq | EXTENDED_RELEASE_TABLET | Freq: Once | ORAL | Status: AC
  Administered 2019-12-05: 40 meq via ORAL
  Filled 2019-12-05: qty 2

## 2019-12-05 MED ORDER — SODIUM CHLORIDE 0.9 % IV BOLUS
500.0000 mL | Freq: Once | INTRAVENOUS | Status: AC
  Administered 2019-12-05: 500 mL via INTRAVENOUS

## 2019-12-05 MED ORDER — TORSEMIDE 100 MG PO TABS: 100.0000 mg | ORAL_TABLET | Freq: Two times a day (BID) | ORAL | 2 refills | Status: DC

## 2019-12-05 MED ORDER — SPIRONOLACTONE 25 MG PO TABS: 25.0000 mg | ORAL_TABLET | Freq: Every day | ORAL | 6 refills | Status: DC

## 2019-12-05 NOTE — Discharge Summary (Addendum)
Advanced Heart Failure Team  Discharge Summary   Patient ID: Logan White MRN: [id number], DOB/AGE: 1941-05-05 79 y.o. Admit date: 11/25/2019 D/C date:     12/05/2019   Primary Discharge Diagnoses:  1. Acute on Chronic Biventricular CHF  2. 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 CAD:  4. PAD w/ Claudication  5. Hypokalemia 6. Chronic AF 7. UTI 8. Anemia , Iron Deficient   Hospital Course:  79 y/o male with chronic combined systolic/ diastolic CHF/ biventricular failure 2/2 ischemic CM, CAD s/p prior PCI of the LAD and LCx, remote h/o VF arrest x 2 (first in 2013 in the setting of hypokalemia (K of 2.2) and again in 2014 in setting of MI), no ICD, h/o permanent atrial fibrillation on chronic a/c w/ apxiaban, CKD (basline SCr ~ 2.0-2.3) and severe LE PAD w/ known R external iliac artery occlusion with severe infrainguinal arterial occlusive disease and peroneal runoff only. L SFA occlusion with diffuse infrainguinal arterial occlusive disease, being followed by [facility] and awaiting surgical revascularization once medically cleared. Of note, he underwent LE arterial angiography on 3/16. Being considered for bilateral femoral endarterectomy and left to right femoral-femoral bypass.  Admitted with A/C Biventricular HF. See below for detailed problems list. He will continue to be followed in the HF clinic. Resuming home health at discharge.    1. Acute on Chronic Biventricular CHF  - known ICM dating back to 2013.  - Echo 1/21showed LVEF 25-30%, global hypokinesis, mild LVH, RV mildly enlarged w/ severely reduced systolic function. Moderate MR. Mild TR. Severe bi atrial enlargement. - Admitted w/ NYHA IIIb symptoms and marked volume overload w/ poor urinary response to high dose IV lasix and worsening renal function, concerning for low output.  [place] on 3/25 showed elevated biventricular filling pressures and with equalization of R &L sided diastolic pressuresand preserved CO consistent withrestrictive  physiology. Diuresed with lasix drip. Creatinine had slight bump. Diuretics held on the day of discharge and will be restarted on 12/08/2019. -  Weight down > 40 pounds.  - He will remain off bb for now.  - May need Palliative care involvement in setting of worsening HF and multiple recent admits   2. 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   3. CAD:  - prior LAD and LCx stenting - No s/s ischemia  - no ASA due to apixaban - continue high dose statin - No ?blocker given ow output   4. PAD w/ Claudication  - knownR external iliac artery occlusion with severe infrainguinal arterial occlusive disease and peroneal runoff only. L SFA occlusion with diffuse infrainguinal arterial occlusive disease, being followed by [facility] and awaiting surgical revascularization once medically cleared. - continues with rest pain. Pain improved with tramadol  - Appreciate [facility] check-in.  From [facility]-->Once he is cleared from a medical standpoint we can try to schedule his surgery. Dr. Trula Slade plans on a left iliac stent, bilateral femoral endarterectomies, and a left to right femorofemoral bypass. This is planned for the week of 4/5-4/9  5. Hypokalemia - K 3.7    6. Chronic AF - Rate controlled.  -  continue apixaban.   7. UTI Urine Culture- E COli Start IV rocephin daily x 3 days.   8. Anemia , Iron Deficient  -Hgb 8.4 on his CO-OX . CBC in am.  - Received Feraheme 3/29.   -  Resume iron.    Discharge Weight: 152.9 pounds  Discharge Vitals: Blood pressure 102/62, pulse 84, temperature 97.8 F (36.6 C), temperature source Oral, resp. rate 17, height 6' 3.5" (1.918 m), weight 69.4 kg, SpO2 100 %.  Labs: Lab Results  Component Value Date   WBC 8.0 12/05/2019   HGB 9.0 (L) 12/05/2019   HCT 26.6 (L) 12/05/2019   MCV 73.9 (L) 12/05/2019   PLT 298 12/05/2019     Recent Labs  Lab 12/05/19 0403  NA 132*  K 3.3*  CL 79*  CO2 37*  BUN 56*  CREATININE 2.45*  CALCIUM 9.3  GLUCOSE 109*   Lab Results  Component Value Date   CHOL 66 11/17/2019   HDL 27 (L) 11/17/2019   LDLCALC 34 11/17/2019   TRIG 27 11/17/2019   BNP (last 3 results) Recent Labs    11/04/19 1241 11/15/19 1110 11/25/19 1004  BNP 1,655.3* 2,374.7* 2,757.9*    ProBNP (last 3 results) Recent Labs    08/15/19 0949  PROBNP 16,511*     Diagnostic Studies/Procedures  RHC 11/28/19  RA = 23 RV = 58/22 PA = 56/21 (38) PCW = 24 Fick cardiac output/index = 6.4/2.9 PVR = 2.2 WU Ao sat = 95% PA sat = 59%. 60%  Assessment: 1. Severely elevated biventricular filling pressures with equalization of R & L sided diastolic pressures suggestive of restrictive physiology.  Discharge Medications   Allergies as of 12/05/2019   No Known Allergies     Medication List    STOP taking these medications   metoprolol succinate 50 MG 24 hr tablet Commonly known as: TOPROL-XL     TAKE these medications   acetaminophen 325 MG tablet Commonly known as: TYLENOL Take 650 mg by mouth every 6 (six) hours as needed for mild pain.   albuterol 108 (90 Base) MCG/ACT inhaler Commonly known as: VENTOLIN HFA Inhale 2 puffs into the lungs every 6 (six) hours as needed for wheezing or shortness of breath.   apixaban 5 MG Tabs tablet Commonly known as: Eliquis Take 1 tablet (5 mg total) by mouth 2 (two) times daily.   atorvastatin 80 MG tablet Commonly known as: LIPITOR TAKE 1 TABLET BY MOUTH EVERY DAY   Ensure Take 237 mLs by mouth 2 (two) times daily as needed (nutritional supplement).   ferrous sulfate 324 MG Tbec Take 324 mg by mouth in the morning and at bedtime.   Fluticasone-Salmeterol 250-50 MCG/DOSE Aepb Commonly known as: Advair Diskus Inhale 1 puff into the lungs 2 (two) times daily for 14 days. What changed:   when to take this  reasons to take this    hydrALAZINE 10 MG tablet Commonly known as: APRESOLINE Take 1 tablet (10 mg total) by mouth 2 (two) times daily.   isosorbide mononitrate 30 MG 24 hr tablet Commonly known as: IMDUR Take 1 tablet (30 mg total) by mouth daily.   nitroGLYCERIN 0.4 MG SL tablet Commonly known as: Nitrostat Place 1 tablet (0.4 mg total) under the tongue every 5 (five) minutes as needed for chest pain (Up to 3 doses).   oxymetazoline 0.05 % nasal spray Commonly known as: AFRIN Place 1 spray into both nostrils 2 (two) times daily as needed for congestion.   pantoprazole 40 MG tablet Commonly known as: PROTONIX Take 1 tablet (40 mg total) by mouth 2 (two) times daily.   potassium chloride SA 20 MEQ tablet Commonly known as: KLOR-CON Take 1 tablet (20 mEq total) by mouth 2 (two) times  daily.   spironolactone 25 MG tablet Commonly known as: ALDACTONE Take 1 tablet (25 mg total) by mouth daily. Start taking on: December 06, 2019   torsemide 100 MG tablet Commonly known as: DEMADEX Take 1 tablet (100 mg total) by mouth 2 (two) times daily. Start taking on: December 08, 2019 What changed: These instructions start on December 08, 2019. If you are unsure what to do until then, ask your doctor or other care provider.       Disposition   The patient will be discharged in stable condition to home. Discharge Instructions    (HEART FAILURE PATIENTS) Call MD:  Anytime you have any of the following symptoms: 1) 3 pound weight gain in 24 hours or 5 pounds in 1 week 2) shortness of breath, with or without a dry hacking cough 3) swelling in the hands, feet or stomach 4) if you have to sleep on extra pillows at night in order to breathe.   Complete by: As directed    Diet - low sodium heart healthy   Complete by: As directed    Heart Failure patients record your daily weight using the same scale at the same time of day   Complete by: As directed    Increase activity slowly   Complete by: As directed      [place] Follow up.   Specialty: Home Health Services Why: [facility], HHPT Contact information: [place] [address] [phone]             Duration of Discharge Encounter: Greater than 35 minutes   Signed, Darrick Grinder NP-C  12/05/2019, 3:34 PM  Patient seen and examined with the above-signed Advanced Practice Provider and/or Housestaff. I personally reviewed laboratory data, imaging studies and relevant notes. I independently examined the patient and formulated the important aspects of the plan. I have edited the note to reflect any of my changes or salient points. I have personally discussed the plan with the patient and/or family.  Feels good. Weight down nearly 50 pounds but creatinine up slightly.   Not dizzy. No edema on exam   Will give 500 cc NS. We discussed home this afternoon or waiting until am to recheck creatinine. He prefers to go home today. Will hold diuretics until Sunday. Check labs on Monday.   Glori Bickers, MD  4:16 PM

## 2019-12-05 NOTE — Progress Notes (Addendum)
Advanced Heart Failure Rounding Note  PCP-Cardiologist: Kirk Ruths, MD   Subjective:   Diuresing with lasix drip + metolazone. Negative >3.5 liters. Weight  Down another 8 pounds.   Creatinine trending up 2.0>2.1>2.45   Weight on admit 202--->152.9 pounds.   Denies SOB.   11/28/19 RA = 23 RV = 58/22 PA = 56/21 (38) PCW = 24 Fick cardiac output/index = 6.4/2.9 PVR = 2.2 WU Ao sat = 95% PA sat = 59%. 60%  Assessment: 1. Severely elevated biventricular filling pressures with equalization of R & L sided diastolic pressures suggestive of restrictive physiology.   Objective:   Weight Range: 69.4 kg Body mass index is 18.86 kg/m.   Vital Signs:   Temp:  [97.8 F (36.6 C)-98.5 F (36.9 C)] 97.8 F (36.6 C) (04/01 1130) Pulse Rate:  [64-106] 84 (04/01 1130) Resp:  [14-34] 17 (04/01 1130) BP: (102-115)/(60-68) 102/62 (04/01 1130) SpO2:  [89 %-100 %] 100 % (04/01 1130) Weight:  [69.4 kg] 69.4 kg (04/01 0329) Last BM Date: 12/04/19  Weight change: Filed Weights   12/03/19 0314 12/04/19 0410 12/05/19 0329  Weight: 79.4 kg 72.9 kg 69.4 kg    Intake/Output:   Intake/Output Summary (Last 24 hours) at 12/05/2019 1133 Last data filed at 12/05/2019 0837 Gross per 24 hour  Intake 1040.09 ml  Output 3150 ml  Net -2109.91 ml      Physical Exam  CVP 2  General:  Sitting in the chair.  No resp difficulty HEENT: normal anicteric  Neck: supple. no JVD. Carotids 2+ bilat; no bruits. No lymphadenopathy or thryomegaly appreciated. RIJ Cor: PMI nondisplaced. Irregular rate & rhythm. No rubs, gallops or murmurs. Lungs: clear no wheeze  Abdomen: soft, nontender, nondistended. No hepatosplenomegaly. No bruits or masses. Good bowel sounds. Extremities: no cyanosis, clubbing, rash, edema.  + surgical scars  Neuro: alert & oriented x 3, cranial nerves grossly intact. moves all 4 extremities w/o difficulty. Affect pleasant   Telemetry   A fib 80-90s personally reviewed.     Labs    CBC Recent Labs    12/04/19 0314 12/05/19 0403  WBC 7.6 8.0  HGB 8.5* 9.0*  HCT 25.5* 26.6*  MCV 73.9* 73.9*  PLT 259 621   Basic Metabolic Panel Recent Labs    12/04/19 0314 12/05/19 0403  NA 126* 132*  K 3.7 3.3*  CL 78* 79*  CO2 36* 37*  GLUCOSE 122* 109*  BUN 54* 56*  CREATININE 2.14* 2.45*  CALCIUM 9.3 9.3  MG 2.0  --    Liver Function Tests No results for input(s): AST, ALT, ALKPHOS, BILITOT, PROT, ALBUMIN in the last 72 hours. No results for input(s): LIPASE, AMYLASE in the last 72 hours. Cardiac Enzymes No results for input(s): CKTOTAL, CKMB, CKMBINDEX, TROPONINI in the last 72 hours.  BNP: BNP (last 3 results) Recent Labs    11/04/19 1241 11/15/19 1110 11/25/19 1004  BNP 1,655.3* 2,374.7* 2,757.9*    ProBNP (last 3 results) Recent Labs    08/15/19 0949  PROBNP 16,511*     D-Dimer No results for input(s): DDIMER in the last 72 hours. Hemoglobin A1C No results for input(s): HGBA1C in the last 72 hours. Fasting Lipid Panel No results for input(s): CHOL, HDL, LDLCALC, TRIG, CHOLHDL, LDLDIRECT in the last 72 hours. Thyroid Function Tests No results for input(s): TSH, T4TOTAL, T3FREE, THYROIDAB in the last 72 hours.  Invalid input(s): FREET3  Other results:   Imaging    No results found.   Medications:  Scheduled Medications: . apixaban  5 mg Oral BID  . atorvastatin  80 mg Oral q1800  . Chlorhexidine Gluconate Cloth  6 each Topical Daily  . isosorbide-hydrALAZINE  1 tablet Oral TID  . mometasone-formoterol  2 puff Inhalation BID  . pantoprazole  40 mg Oral BID  . sodium chloride flush  3 mL Intravenous Q12H  . sodium chloride flush  3 mL Intravenous Q12H  . spironolactone  25 mg Oral Daily  . torsemide  100 mg Oral BID    Infusions: . sodium chloride    . sodium chloride    . sodium chloride Stopped (12/02/19 1403)  . [START ON 12/06/2019] ferumoxytol      PRN Medications: sodium chloride, sodium  chloride, acetaminophen, albuterol, camphor-menthol, ipratropium-albuterol, nitroGLYCERIN, ondansetron (ZOFRAN) IV, sodium chloride flush, sodium chloride flush, traMADol    Assessment/Plan   1. Acute on Chronic Biventricular CHF  - known ICM dating back to 2013.  - Echo 1/21 showed LVEF 25-30%, global hypokinesis, mild LVH, RV mildly enlarged w/ severely reduced systolic function. Moderate MR. Mild TR. Severe bi atrial enlargement. - Admitted w/ NYHA IIIb symptoms and marked volume overload w/ poor urinary response to high dose IV lasix and worsening renal function, concerning for low output.  Central on 3/25 showed elevated biventricular filling pressures and with equalization of R &L sided diastolic pressures and preserved CO consistent with restrictive physiology.  - CO-OX 50%. CVP 2.  -  Hold torsemide until 12/07/19.  - Completed 3 days of diamox.    -  Weight down ~ 47 pounds. - Hold ? blocker  - May need Palliative care involvement in setting of worsening HF and multiple recent admits   2. 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 CAD:  - prior LAD and LCx stenting - No s/s ischemia   - no ASA due to apixaban - continue high dose statin - hold ? blocker given ow output   4. PAD w/ Claudication  - known R external iliac artery occlusion with severe infrainguinal arterial occlusive disease and peroneal runoff only. L SFA occlusion with diffuse infrainguinal arterial occlusive disease, being followed by [facility] and awaiting surgical revascularization once medically cleared. - continues with rest pain. Pain improved with tramadol  - Appreciate [facility] check-in.   From [facility]-->Once he is cleared from a medical standpoint we can try to schedule his surgery.  Dr. Trula Slade plans on a left iliac stent, bilateral femoral endarterectomies, and a left to right femorofemoral bypass.   This is planned for the week of 4/5-4/9  5. Hypokalemia - K 3.7    6. Chronic AF -Rate controlled.  -  continue apixaban.   7. UTI Urine Culture- E COli Completed 3 days of rocephin.    8. Anemia , Iron Deficient  - Hgb 8.4 on his CO-OX . CBC in am.  - Received Feraheme 3/29 and 12/05/19  - Iron Sats low. .   Plan to continue [facility] with [facility].    Length of Stay: [place], NP  12/05/2019, 11:33 AM  Advanced Heart Failure Team Pager [phone] (M-F; 7a - 4p)  Please contact [facility] for night-coverage after hours (4p -7a ) and weekends on [email or web address]  Patient seen and examined with the above-signed Advanced Practice Provider and/or Housestaff. I personally reviewed laboratory data, imaging studies and relevant notes. I independently examined the patient  and formulated the important aspects of the plan. I have edited the note to reflect any of my changes or salient points. I have personally discussed the plan with the patient and/or family.  Feels good. Weight down nearly 50 pounds but creatinine up slightly.   Not dizzy. No edema on exam   Will give 500 cc NS. We discussed home this afternoon or waiting until am to recheck creatinine. He prefers to go home today. Will hold diuretics until Sunday. Check labs on Monday.   Glori Bickers, MD  4:16 PM

## 2019-12-05 NOTE — Progress Notes (Signed)
Per Dr Haroldine Laws, pt needs bmet on Mon at [place] office, order placed.

## 2019-12-05 NOTE — Progress Notes (Signed)
Physical Therapy Treatment Patient Details Name: Logan White MRN: [id number] DOB: 12-21-1940 Today's Date: 12/05/2019    History of Present Illness Pt is 79 yo male with PMH including CHF, Afib, CAD, Vfib arres, ischemic cardiomyopathy, and PVD.  Pt was admitted with CHF and PAD with claudication.  Pt is being considered for left iliac stenting, bilateral iliac endarterectomy and left to right femorofemoral bypass once medically stable.    PT Comments    Patient in bed upon arrival and agreeable to participate in therapy after encouragement. Pt tolerated gait training well on 2L O2 via [place]. Pt will continue to benefit from further skilled PT services to maximize independence and safety with mobility.     Follow Up Recommendations  Home health PT;Supervision for mobility/OOB     Equipment Recommendations  Rolling walker with 5" wheels    Recommendations for Other Services       Precautions / Restrictions Precautions Precautions: Fall Restrictions Weight Bearing Restrictions: No    Mobility  Bed Mobility Overal bed mobility: Independent                Transfers Overall transfer level: Needs assistance Equipment used: Rolling walker (2 wheeled) Transfers: Sit to/from Stand Sit to Stand: Min guard;From elevated surface         General transfer comment: cues for safe hand placement  Ambulation/Gait Ambulation/Gait assistance: Min guard;Supervision Gait Distance (Feet): 100 Feet Assistive device: Rolling walker (2 wheeled) Gait Pattern/deviations: Step-through pattern;Decreased stride length;Trunk flexed;Narrow base of support Gait velocity: decreased   General Gait Details: cues for upright posture and proximity to RW when turning; grossly supervision for safety    Stairs             Wheelchair Mobility    Modified Rankin (Stroke Patients Only)       Balance Overall balance assessment: Needs assistance Sitting-balance support: Feet  supported;No upper extremity supported Sitting balance-Leahy Scale: Normal     Standing balance support: Bilateral upper extremity supported;During functional activity Standing balance-Leahy Scale: Poor                              Cognition Arousal/Alertness: Awake/alert Behavior During Therapy: Agitated;Flat affect Overall Cognitive Status: Within Functional Limits for tasks assessed                                 General Comments: wants to sleep      Exercises      General Comments General comments (skin integrity, edema, etc.): pt on 2L O2 via [place]      Pertinent Vitals/Pain Pain Assessment: No/denies pain    Home Living                      Prior Function            PT Goals (current goals can now be found in the care plan section) Progress towards PT goals: Progressing toward goals    Frequency    Min 3X/week      PT Plan Current plan remains appropriate    Co-evaluation              AM-PAC PT "6 Clicks" Mobility   Outcome Measure  Help needed turning from your back to your side while in a flat bed without using bedrails?: None Help needed moving from lying on your back  to sitting on the side of a flat bed without using bedrails?: None Help needed moving to and from a bed to a chair (including a wheelchair)?: A Little Help needed standing up from a chair using your arms (e.g., wheelchair or bedside chair)?: A Little Help needed to walk in hospital room?: A Little Help needed climbing 3-5 steps with a railing? : A Little 6 Click Score: 20    End of Session Equipment Utilized During Treatment: Oxygen;Gait belt Activity Tolerance: Patient tolerated treatment well Patient left: with call bell/phone within reach;in bed;with bed alarm set Nurse Communication: Mobility status PT Visit Diagnosis: Difficulty in walking, not elsewhere classified (R26.2);Pain Pain - Right/Left: Left Pain - part of body: Leg      Time: 1411-1436 PT Time Calculation (min) (ACUTE ONLY): 25 min  Charges:  $Gait Training: 23-37 mins                     Earney Navy, PTA Acute Rehabilitation Services Pager: [phone] Office: [phone]     MALACHY COLEMAN 12/05/2019, 4:37 PM

## 2019-12-09 ENCOUNTER — Ambulatory Visit (INDEPENDENT_AMBULATORY_CARE_PROVIDER_SITE_OTHER): Payer: Medicare HMO | Admitting: Surgery

## 2019-12-09 ENCOUNTER — Other Ambulatory Visit: Payer: Self-pay

## 2019-12-09 ENCOUNTER — Other Ambulatory Visit: Payer: Self-pay | Admitting: *Deleted

## 2019-12-09 ENCOUNTER — Ambulatory Visit (HOSPITAL_COMMUNITY)
Admission: RE | Admit: 2019-12-09 | Discharge: 2019-12-09 | Disposition: A | Discharge status: Home / Self Care | Payer: Medicare HMO | Source: Ambulatory Visit | Attending: Surgery | Admitting: Surgery

## 2019-12-09 ENCOUNTER — Encounter: Payer: Self-pay | Admitting: Surgery

## 2019-12-09 VITALS — BP 144/116 | HR 86 | Temp 98.1°F | Resp 18 | Ht 75.5 in | Wt 168.0 lb

## 2019-12-09 DIAGNOSIS — I5082 Biventricular heart failure: Secondary | ICD-10-CM | POA: Diagnosis not present

## 2019-12-09 DIAGNOSIS — I4819 Other persistent atrial fibrillation: Secondary | ICD-10-CM | POA: Diagnosis not present

## 2019-12-09 DIAGNOSIS — I5022 Chronic systolic (congestive) heart failure: Secondary | ICD-10-CM | POA: Diagnosis not present

## 2019-12-09 DIAGNOSIS — T82858D Stenosis of vascular prosthetic devices, implants and grafts, subsequent encounter: Secondary | ICD-10-CM | POA: Diagnosis not present

## 2019-12-09 DIAGNOSIS — D631 Anemia in chronic kidney disease: Secondary | ICD-10-CM | POA: Diagnosis not present

## 2019-12-09 DIAGNOSIS — N184 Chronic kidney disease, stage 4 (severe): Secondary | ICD-10-CM | POA: Diagnosis not present

## 2019-12-09 DIAGNOSIS — I7025 Atherosclerosis of native arteries of other extremities with ulceration: Secondary | ICD-10-CM

## 2019-12-09 DIAGNOSIS — E1151 Type 2 diabetes mellitus with diabetic peripheral angiopathy without gangrene: Secondary | ICD-10-CM | POA: Diagnosis not present

## 2019-12-09 DIAGNOSIS — E1122 Type 2 diabetes mellitus with diabetic chronic kidney disease: Secondary | ICD-10-CM | POA: Diagnosis not present

## 2019-12-09 DIAGNOSIS — I6523 Occlusion and stenosis of bilateral carotid arteries: Secondary | ICD-10-CM | POA: Diagnosis not present

## 2019-12-09 DIAGNOSIS — I130 Hypertensive heart and chronic kidney disease with heart failure and stage 1 through stage 4 chronic kidney disease, or unspecified chronic kidney disease: Secondary | ICD-10-CM | POA: Diagnosis not present

## 2019-12-09 NOTE — Progress Notes (Signed)
Vascular and Vein Specialist of   Patient name: Logan White MRN: [id number] DOB: 1941/08/06 Sex: male   REASON FOR VISIT:    Follow up  HISOTRY OF PRESENT ILLNESS:    Logan White is a 79 y.o. male who I met on 11/15/2019 when the patient was having complaints of right leg pain.  He has a known right external iliac occlusion with severe infrainguinal occlusive disease on the right.  This was diagnosed by angiography from Dr. Alvester Chou in the past.  I repeated his arteriogram during his stay which confirmed the above findings.  I was planning on a left to right femoral-femoral bypass graft and left iliac stenting to get him out of rest pain.  He was in the hospital for heart failure issues and was deemed a prohibitive operative risk.  He continued to get diuresed and was ultimately discharged from the hospital this past weekend.  He is on anticoagulation for his atrial fibrillation which also helps his wrist pain.  Currently he states that he is not having any leg pain.  He is not doing hardly any walking because of his shortness of breath and generalized weakness.  The patient does have a small open wound on the inside of his left great toe.  He has chronic renal insufficiency.  His creatinine has been stable, around 3.  He suffers from diabetes and hyperlipidemia.  He is currently on home oxygen.   PAST MEDICAL HISTORY:   Past Medical History:  Diagnosis Date  . AAA (abdominal aortic aneurysm) ([place])    a. 08/2015 Abd U/S: 2.7 x 2.9 x 3.2 cm infrarenal AAA.  . Bradycardia    a. Requiring discontinuation of use of BB/CCB  . Cardiac arrest - ventricular fibrillation    a. 03/2012 in setting of hypokalemia (prolonged hosp with VDRF, tracheobronchitis, ARF, shock liver, PAF, AMS felt secondary to post-anoxic encephalopathy/shock).  . Carotid artery stenosis    a. 01/2011 - 40-59% bilateral stenosis;  b. 06/2015 Carotid U/S: 40-59% bilat ICA  stenosis->f/u 6 mos.  . CKD (chronic kidney disease), stage III   . Coronary artery disease    a. s/p aborted ant STEMI tx with Cypher DES to LAD 10/04 (residual at cath: D1 50%, CFX 40% and multiple dist 70%, EF 55%);   b. myoview 3/10: Ef 47%, infero-apical isch, LOW RISK - med Tx recommended;  c. 12/2012 VF Arrest/Cath: LAD 40isr, 80 apical, LCX 100 (failed PTCA), RCA nondom.  . Diabetes mellitus   . Diverticulosis 2001  . Elevated LFTs    Shock liver 03/2012  . H/O: CVA (cardiovascular accident)   . Hematemesis    a. 12/2010 felt 2/2 Mallory Weiss tear - pt could not afford colonscopy/EGD at that time so Coumadin was deferred. Coumadin initiated 03/2012 without any evidence for bleeding.  Marland Kitchen History of Ischemic Cardiomyopathy    a. EF 50-55% by echo 03/09/12 (was 30% by echo 02/24/12); b. 03/2013 Echo: EF 55-60%, mild MR, sev dil LA, mod dil RA, PASP 32mHg.  .Marland KitchenHypertensive heart disease    a. echo 4/12: EF 50%, asymmetric septal hypertrophy, no SAM or LVOT gradient, LAE, PASP 35  . Inguinal hernia   . 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 Mitral regurgitation    a. Mild by echo 03/2012 and 03/2013.  . Mixed Hyperlipidemia   . Peripheral vascular disease ([place]    a. h/o LE angioplasty;  b. 08/2015 Duplex: >50% RCIA, 100% REIA, >50% LEIA, elev  vel in SMA, patent IVC.  Marland Kitchen Persistent atrial fibrillation ([place])    a. Dx 12/2010-->coumadin (CHA2DS2VASc = 7).  . QT prolongation      FAMILY HISTORY:   Family History  Problem Relation Age of Onset  . Heart disease Father        ALSO UNCLE DIED HAD CAD  . Heart failure Father   . Brain cancer Mother   . Leukemia Mother   . Sickle cell anemia Mother   . Sickle cell anemia Other     SOCIAL HISTORY:   Social History   Tobacco Use  . Smoking status: Former Smoker    Packs/day: 0.50    Years: 30.00    Pack years: 15.00    Types: Cigarettes    Quit date: 03/12/2012    Years since quitting: 7.7  .  Smokeless tobacco: Never Used  Substance Use Topics  . Alcohol use: No    Alcohol/week: 0.0 standard drinks     ALLERGIES:   No Known Allergies   CURRENT MEDICATIONS:   Current Outpatient Medications  Medication Sig Dispense Refill  . acetaminophen (TYLENOL) 325 MG tablet Take 650 mg by mouth every 6 (six) hours as needed for mild pain.     Marland Kitchen albuterol (VENTOLIN HFA) 108 (90 Base) MCG/ACT inhaler Inhale 2 puffs into the lungs every 6 (six) hours as needed for wheezing or shortness of breath. 90 g 3  . apixaban (ELIQUIS) 5 MG TABS tablet Take 1 tablet (5 mg total) by mouth 2 (two) times daily. 60 tablet 0  . atorvastatin (LIPITOR) 80 MG tablet TAKE 1 TABLET BY MOUTH EVERY DAY 90 tablet 3  . Ensure (ENSURE) Take 237 mLs by mouth 2 (two) times daily as needed (nutritional supplement).    . ferrous sulfate 324 MG TBEC Take 324 mg by mouth in the morning and at bedtime.    . hydrALAZINE (APRESOLINE) 10 MG tablet Take 1 tablet (10 mg total) by mouth 2 (two) times daily. 180 tablet 0  . isosorbide mononitrate (IMDUR) 30 MG 24 hr tablet Take 1 tablet (30 mg total) by mouth daily. 30 tablet 1  . nitroGLYCERIN (NITROSTAT) 0.4 MG SL tablet Place 1 tablet (0.4 mg total) under the tongue every 5 (five) minutes as needed for chest pain (Up to 3 doses). 25 tablet 4  . oxymetazoline (AFRIN) 0.05 % nasal spray Place 1 spray into both nostrils 2 (two) times daily as needed for congestion.    . pantoprazole (PROTONIX) 40 MG tablet Take 1 tablet (40 mg total) by mouth 2 (two) times daily. 60 tablet 0  . spironolactone (ALDACTONE) 25 MG tablet Take 1 tablet (25 mg total) by mouth daily. 30 tablet 6  . torsemide (DEMADEX) 100 MG tablet Take 1 tablet (100 mg total) by mouth 2 (two) times daily. 120 tablet 2  . Fluticasone-Salmeterol (ADVAIR DISKUS) 250-50 MCG/DOSE AEPB Inhale 1 puff into the lungs 2 (two) times daily for 14 days. (Patient taking differently: Inhale 1 puff into the lungs 2 (two) times daily as  needed (shortness of breath). ) 28 each 0  . potassium chloride SA (KLOR-CON) 20 MEQ tablet Take 1 tablet (20 mEq total) by mouth 2 (two) times daily. 120 tablet 0   No current facility-administered medications for this visit.    REVIEW OF SYSTEMS:   '[X]'  denotes positive finding, '[ ]'  denotes negative finding Cardiac  Comments:  Chest pain or chest pressure:    Shortness of breath upon exertion: x  Short of breath when lying flat: x   Irregular heart rhythm: x       Vascular    Pain in calf, thigh, or hip brought on by ambulation:    Pain in feet at night that wakes you up from your sleep:  x   Blood clot in your veins:    Leg swelling:         Pulmonary    Oxygen at home:    Productive cough:     Wheezing:         Neurologic    Sudden weakness in arms or legs:     Sudden numbness in arms or legs:     Sudden onset of difficulty speaking or slurred speech:    Temporary loss of vision in one eye:     Problems with dizziness:         Gastrointestinal    Blood in stool:     Vomited blood:         Genitourinary    Burning when urinating:     Blood in urine:        Psychiatric    Major depression:         Hematologic    Bleeding problems:    Problems with blood clotting too easily:        Skin    Rashes or ulcers: x       Constitutional    Fever or chills:      PHYSICAL EXAM:   Vitals:   12/09/19 1548  BP: (!) 144/116  Pulse: 86  Resp: 18  Temp: 98.1 F (36.7 C)  TempSrc: Temporal  SpO2: 100%  Weight: 168 lb (76.2 kg)  Height: 6' 3.5" (1.918 m)    GENERAL: The patient is a well-nourished male, in no acute distress. The vital signs are documented above. CARDIAC: There is a regular rate and rhythm.  VASCULAR: Nonpalpable pedal pulses PULMONARY: Non-labored respirations MUSCULOSKELETAL: There are no major deformities or cyanosis. NEUROLOGIC: No focal weakness or paresthesias are detected. SKIN: 4 x 4 superficial ulceration on the lateral aspect of  the right  great toe PSYCHIATRIC: The patient has a normal affect.  STUDIES:   None  MEDICAL ISSUES:   Lower extremity atherosclerotic vascular disease: The patient has a very difficult situation.  Most of his issues are cardiac in origin, which contribute to his overall potential operative morbidity.  He has been having rest pain in his right leg however he now states today that his legs do not bother him.  I would not recommend surgical intervention for his disease unless he is having rest pain which he is not.  Unfortunately, the patient does have a open wound which is very superficial on the side of his right great toe.  I doubt this will heal without better blood flow however generally to heal a wound like this he would need both a inflow and outflow procedure.  I am not sure he would be a candidate for that type of an operation.  Since he has just been discharged from the hospital, I am going to give him a few weeks to see how he holds up.  He will come back for a wound check.  If this has not gotten worse then we can continue to observe him if it has deteriorated at all, we would need to proceed with surgery.  I had previously contemplated a left iliac stent along with shockwave due to the extensive calcification, followed  by bilateral femoral endarterectomies and a left to right femoral-femoral bypass    Annamarie Major, IV, MD, FACS [facility]in Specialists of [facility] [phone] Pager [phone]

## 2019-12-09 NOTE — Patient Outreach (Signed)
[facility]) Care Management  12/09/2019  Logan White 10/19/1940 [id number]  Telephone assessment  Transition of care .   Referral received : 12/09/19 Referral source: Notification of Discharge from inpatient at [facility] on 12/05/19 Diagnosis : Heart Failure  Insurance: [employer]    Subjective: Initial outreach call to patient unsuccessful no answer able to leave a HIPAA compliant message for return call. Placed call to patient wife mobile number , Logan White, Designated Party Release, successful outreach call explained reason for the call .   Social Patient lives at home with his spouse that is able to assist with care as needed. Patient uses walker while in the home, but not doing much moving around. Wife reports patient using urinal at bedside but is able to walk to bathroom. Wife provides transportation to appointments.  Conditions   Heart Failure  Wife reports patient is better today but had a rough weekend. She discussed that patient was not able to keep anything down. She reports that so far on today he is doing better, no vomiting. She reports patient has been having bowel movement on each day.  Wife discussed patient was not able to take medication on yesterday due to being fearful of vomiting. She discussed patient was scheduled to start  back on Torsemide on yesterday but she did not give it because he didn't keep anything down, but will give medications on today.  She reports that he is doing better on today no vomiting and tolerating foods.  Discussed with wife is patient current heart failure symptoms , she denies patient increased shortness of breath, swelling .  Wife discussed patient has not weighed since discharge, she reports him feeling weak since being home. She discussed patient having  visit from [place] on today, she reports patient scales not registering correct, and she has tried scales and they are not registering  correctly when she attempted .Reviewed patient weight at discharge noted at 152.9 .  Wife placed patient on the phone, explained purpose of the call and [facility] care management services.  Patient reports feeling better on today, he denies shortness of breath, denies increased swelling but feeling tired. Discussed importance of taking medications, importance of keeping track of weights. Patient discussed problem with home scales.   Patient wife discussed patient has appointment with Dr. Chaya Jan on today, as well as labs at Cardiology office today.    Medications Patient was recently discharged from hospital and all medications have been reviewed.Wife denies cost concerns related to medications , she organizes and helps patient with daily medications.   Assessment  Patient agreeable to [facility] care management services for management of Chronic medical conditions of Heart Failure.  Patient/wife limited time today to speak on phone today  due to preparing for  medical appointments this afternoon.    Plan  Will plan follow up call to patient/wife on next business day for further assessment and care coordination regarding patient need for new scales for daily weight monitoring.  Reinforced worsening symptoms of heart failure and when to call MD, increased shortness of breath, swelling sudden weight of 3 pounds in day , 5 in a week, stressed importance of tracking daily weights.     Joylene Draft, RN, BSN  [place] Management Coordinator  [phone]- Mobile [phone]- Toll Free Main Office

## 2019-12-10 ENCOUNTER — Other Ambulatory Visit: Payer: Self-pay | Admitting: *Deleted

## 2019-12-10 DIAGNOSIS — I5022 Chronic systolic (congestive) heart failure: Secondary | ICD-10-CM

## 2019-12-10 NOTE — Patient Outreach (Signed)
[facility]) Care Management  12/10/2019  Logan White 12/16/1940 [id number]   Telephone assessment /follow up transition of care   Referral received : 12/09/19 Referral source: Notification of Discharge from inpatient at [facility] on 12/05/19 Diagnosis : Heart Failure  Insurance: [employer]   Subjective: Successful outreach call to patient wife Logan White she reports patient still resting in the bed, has not gotten up yet.  She discussed patient visit with Dr. Trula Slade on yesterday and plans to hold off on any surgery for a few weeks. She states patient denies complaining of discomfort in leg.  She states patient was too tired to go for lab appointment on yesterday that she plans to take him on today.   She reports patient able to take medication on yesterday after eating a little breakfast, he also had ensure,  but vomited later in evening after eating chicken wings. Discussed importance of limiting salt , fast foods with higher salt and causes body to hold on to more fluid. She reports patient is tolerating drinking liquids.  Stressed importance of daily weight monitoring to identify sudden weight gain.  She states scales at home she received from [facility] office about a month ago, she plans to change batteries on today. Wife reports patient is able to uses walker for mobility to the bathroom as well as using urinal .   Discussed with wife [facility] based program for managing chronic conditions such as Heart failure, explained differenced between home health . Discussed no additional cost as benefit of [facility] , and does not interfere with home health services. Wife voiced understanding and  is agreeable with program support   1230  Placed call to [place] that visited with patient on yesterday, she voiced patient being tired, she denies noting patient with any shortness of breath,no swelling in abdomen, legs.Nurse states scales were not working  during her visit on yesterday. She reports next home visit for 4/7 and states physical therapy has been ordered.   [place] call to Heart failure clinic able to leave message on the nurse triage line to  discuss concern reported complaint of patient with vomiting episodes  Over the weekend after  discharge home, continued weakness, patient not weighing since discharge home, reported weakness and then scales not working on yesterday.   1630 Return call to patient wife, she reports trying batteries and scales still not working as she weighs herself, reading number much lower,instructed in making sure scale is measuring in lbs, she was able to identify and change back to pounds and she tested her weight.   Plan Will plan follow up call in the next 2 business days.  Will send [facility] welcome letter, verified patient wife has [facility] care management contact information.  Will send referral to [facility] for management on chronic conditions  Reinforced on worsening heart failure symptoms and notifying MD of concerns sooner.    [facility] CM Care Plan Problem One     Most Recent Value  Care Plan Problem One  High risk for readmission related to recent hospitalization for Heart failure exaceration   Role Documenting the Problem One  Care Management Coordinator  Care Plan for Problem One  Active  [facility] Long Term Goal   Patient will experience hospital readmission due Heart failure over the next 31 days   [facility] Long Term Goal Start Date  12/09/19  Interventions for Problem One Long Term Goal  Reinforced worsening symptoms of heart  failure to notify MD of or sooner or new concerns.   [facility] CM Short Term Goal #1   Patient will attend all medical appointments over the next 30 days   [facility] CM Short Term Goal #1 Start Date  12/09/19  Interventions for Short Term Goal #1  Discussed with wife follow up lab visit .   [facility] CM Short Term Goal #2   Patient will weigh daily and keep a log over the next 30 days   [facility] CM Short  Term Goal #2 Start Date  12/09/19  Interventions for Short Term Goal #2  Troubleshoot scale operation until corrected. Reviewed best time of day to weigh in am after going to bathroom before getting dressed , discussed consistent time       Joylene Draft, RN, BSN  [place] Management Coordinator  [phone]- Mobile [phone]- [place]

## 2019-12-11 LAB — BASIC METABOLIC PANEL
BUN/Creatinine Ratio: 26 — ABNORMAL HIGH (ref 10–24)
BUN: 63 mg/dL — ABNORMAL HIGH (ref 8–27)
CO2: 29 mmol/L (ref 20–29)
Calcium: 9.9 mg/dL (ref 8.6–10.2)
Chloride: 84 mmol/L — ABNORMAL LOW (ref 96–106)
Creatinine, Ser: 2.42 mg/dL — ABNORMAL HIGH (ref 0.76–1.27)
GFR calc Af Amer: 29 mL/min/{1.73_m2} — ABNORMAL LOW (ref >59)
GFR calc non Af Amer: 25 mL/min/{1.73_m2} — ABNORMAL LOW (ref >59)
Glucose: 150 mg/dL — ABNORMAL HIGH (ref 65–99)
Potassium: 3.7 mmol/L (ref 3.5–5.2)
Sodium: 132 mmol/L — ABNORMAL LOW (ref 134–144)

## 2019-12-12 ENCOUNTER — Other Ambulatory Visit: Payer: Self-pay | Admitting: *Deleted

## 2019-12-12 NOTE — Patient Outreach (Addendum)
[facility]) Care Management  12/12/2019  Logan White June 11, 1941 [id number]   Telephone assessment Lesly Dukes of care follow up  .   Referral received : 12/09/19 Referral source: Notification of Discharge from inpatient at [facility] on 12/05/19 Diagnosis : Heart Failure  Insurance: [employer]  Subjective: Successful outreach call to patient wife, Logan White.  Wife discussed concern regarding patient continued very low energy, stays in bed most of day. She reports that he does seem to rest well at night. She reports that he continue to wear oxygen at 2 liters, denies shortness of breath , increase in swelling. Wife reports patient weight as being 142 .6 for the last 2 days ( weight 152.9 at discharge on 12/05/19). She confirms scale being accurate with weighing herself.  She reports patient appetite is decreased, she reports patient vomiting a little on last evening a few hours after eating, she was unable to see contents, patient denies abdominal pain.  she is trying different foods, states patient ate a salad and 1/2 bolonaga sandwich on evening. Reviewed processed foods with higher salt content, discussed trying softer foods for meals.  She is supplementing his diet with ensure he is tolerating drinking other liquids. She reports patient urinating, denies urine having a dark color.  She reports patient having follow visit with Dr. Fuller Plan , GI in next week.  Wife reports receiving call from [facility] that has visit scheduled for Friday at 11;00.  Discussed with wife scheduling post discharge visit to establish with PCP, previous initial appointment due to being readmitted  , verified that she has contact number, offered to assist with call , she states that she will do it, emphasized benefits of provider follow ups.   Plan Placed call to [facility] center, able to leave a message on Nurse Triage line, regarding patient concerns, of low energy, decreased intake,  vomiting episodes.  Will plan return call to patient in the next 3 business days    Joylene Draft, RN, BSN  [place] Management Coordinator  [phone]- Mobile [phone]- [place]

## 2019-12-13 ENCOUNTER — Other Ambulatory Visit: Payer: Self-pay | Admitting: Family Medicine

## 2019-12-13 DIAGNOSIS — D631 Anemia in chronic kidney disease: Secondary | ICD-10-CM | POA: Diagnosis not present

## 2019-12-13 DIAGNOSIS — I5022 Chronic systolic (congestive) heart failure: Secondary | ICD-10-CM | POA: Diagnosis not present

## 2019-12-13 DIAGNOSIS — N184 Chronic kidney disease, stage 4 (severe): Secondary | ICD-10-CM | POA: Diagnosis not present

## 2019-12-13 DIAGNOSIS — E1151 Type 2 diabetes mellitus with diabetic peripheral angiopathy without gangrene: Secondary | ICD-10-CM | POA: Diagnosis not present

## 2019-12-13 DIAGNOSIS — I4819 Other persistent atrial fibrillation: Secondary | ICD-10-CM | POA: Diagnosis not present

## 2019-12-13 DIAGNOSIS — I130 Hypertensive heart and chronic kidney disease with heart failure and stage 1 through stage 4 chronic kidney disease, or unspecified chronic kidney disease: Secondary | ICD-10-CM | POA: Diagnosis not present

## 2019-12-13 DIAGNOSIS — T82858D Stenosis of vascular prosthetic devices, implants and grafts, subsequent encounter: Secondary | ICD-10-CM | POA: Diagnosis not present

## 2019-12-13 DIAGNOSIS — I5082 Biventricular heart failure: Secondary | ICD-10-CM | POA: Diagnosis not present

## 2019-12-13 DIAGNOSIS — E1122 Type 2 diabetes mellitus with diabetic chronic kidney disease: Secondary | ICD-10-CM | POA: Diagnosis not present

## 2019-12-14 DIAGNOSIS — I5022 Chronic systolic (congestive) heart failure: Secondary | ICD-10-CM | POA: Diagnosis not present

## 2019-12-14 DIAGNOSIS — N184 Chronic kidney disease, stage 4 (severe): Secondary | ICD-10-CM | POA: Diagnosis not present

## 2019-12-14 DIAGNOSIS — I130 Hypertensive heart and chronic kidney disease with heart failure and stage 1 through stage 4 chronic kidney disease, or unspecified chronic kidney disease: Secondary | ICD-10-CM | POA: Diagnosis not present

## 2019-12-14 DIAGNOSIS — I5082 Biventricular heart failure: Secondary | ICD-10-CM | POA: Diagnosis not present

## 2019-12-14 DIAGNOSIS — I4819 Other persistent atrial fibrillation: Secondary | ICD-10-CM | POA: Diagnosis not present

## 2019-12-14 DIAGNOSIS — E1151 Type 2 diabetes mellitus with diabetic peripheral angiopathy without gangrene: Secondary | ICD-10-CM | POA: Diagnosis not present

## 2019-12-14 DIAGNOSIS — T82858D Stenosis of vascular prosthetic devices, implants and grafts, subsequent encounter: Secondary | ICD-10-CM | POA: Diagnosis not present

## 2019-12-14 DIAGNOSIS — D631 Anemia in chronic kidney disease: Secondary | ICD-10-CM | POA: Diagnosis not present

## 2019-12-14 DIAGNOSIS — E1122 Type 2 diabetes mellitus with diabetic chronic kidney disease: Secondary | ICD-10-CM | POA: Diagnosis not present

## 2019-12-17 ENCOUNTER — Other Ambulatory Visit: Payer: Self-pay | Admitting: *Deleted

## 2019-12-17 ENCOUNTER — Encounter: Payer: Self-pay | Admitting: *Deleted

## 2019-12-17 DIAGNOSIS — I130 Hypertensive heart and chronic kidney disease with heart failure and stage 1 through stage 4 chronic kidney disease, or unspecified chronic kidney disease: Secondary | ICD-10-CM | POA: Diagnosis not present

## 2019-12-17 DIAGNOSIS — E1151 Type 2 diabetes mellitus with diabetic peripheral angiopathy without gangrene: Secondary | ICD-10-CM | POA: Diagnosis not present

## 2019-12-17 DIAGNOSIS — T82858D Stenosis of vascular prosthetic devices, implants and grafts, subsequent encounter: Secondary | ICD-10-CM | POA: Diagnosis not present

## 2019-12-17 DIAGNOSIS — I4819 Other persistent atrial fibrillation: Secondary | ICD-10-CM | POA: Diagnosis not present

## 2019-12-17 DIAGNOSIS — D631 Anemia in chronic kidney disease: Secondary | ICD-10-CM | POA: Diagnosis not present

## 2019-12-17 DIAGNOSIS — E1122 Type 2 diabetes mellitus with diabetic chronic kidney disease: Secondary | ICD-10-CM | POA: Diagnosis not present

## 2019-12-17 DIAGNOSIS — I5022 Chronic systolic (congestive) heart failure: Secondary | ICD-10-CM | POA: Diagnosis not present

## 2019-12-17 DIAGNOSIS — N184 Chronic kidney disease, stage 4 (severe): Secondary | ICD-10-CM | POA: Diagnosis not present

## 2019-12-17 DIAGNOSIS — I5082 Biventricular heart failure: Secondary | ICD-10-CM | POA: Diagnosis not present

## 2019-12-17 NOTE — Patient Outreach (Addendum)
[facility]) Care Management  [place]  12/17/2019   Logan White 1941-06-19 [id number]   Transition of care  Referral received : 12/09/19 Referral source: Notification of Discharge from inpatient at [facility] on 12/05/19 Diagnosis : Heart Failure  Insurance: [place]   Patient is 79 year old male, with PMH that is significant for : chronic combined systolic diastolic CHF Biventricular failure , Cardiomyopathy, CAD, permanent Atrial fib, severe left PAD with know iliac artery occlusion,    Subjective:  Successful outreach call to patient, he reports feeling much better over the last few days. He discussed improved appetite and intake he denies having nausea/vomiting. Marland Kitchen He reports improvement with mobility in the home using a cane. He denies any discomfort at left leg area.  Patient denies increase shortness of breath, swelling  or weight gain. He discussed weight at 150, but has not weighed in  previous 3 days.  Patient discussed wanting barbeque, explained to patient importance of limiting salt in diet and foods that have more salt. Emphasized the importance of moderation in diet to help prevent worsening symptoms of heart failure.  Patient placed his wife Mardene Celeste on the phone she discussed patient continues with home health PT /OT and [facility] visits within the last week.     Encounter Medications:  Outpatient Encounter Medications as of 12/17/2019  Medication Sig  . acetaminophen (TYLENOL) 325 MG tablet Take 650 mg by mouth every 6 (six) hours as needed for mild pain.   Marland Kitchen albuterol (VENTOLIN HFA) 108 (90 Base) MCG/ACT inhaler Inhale 2 puffs into the lungs every 6 (six) hours as needed for wheezing or shortness of breath.  Marland Kitchen apixaban (ELIQUIS) 5 MG TABS tablet Take 1 tablet (5 mg total) by mouth 2 (two) times daily.  Marland Kitchen atorvastatin (LIPITOR) 80 MG tablet TAKE 1 TABLET BY MOUTH EVERY DAY  . Ensure (ENSURE) Take 237 mLs by mouth 2 (two) times  daily as needed (nutritional supplement).  . ferrous sulfate 324 MG TBEC Take 324 mg by mouth in the morning and at bedtime.  . Fluticasone-Salmeterol (ADVAIR DISKUS) 250-50 MCG/DOSE AEPB Inhale 1 puff into the lungs 2 (two) times daily for 14 days. (Patient taking differently: Inhale 1 puff into the lungs 2 (two) times daily as needed (shortness of breath). )  . hydrALAZINE (APRESOLINE) 10 MG tablet TAKE 1 TABLET BY MOUTH TWICE A DAY  . isosorbide mononitrate (IMDUR) 30 MG 24 hr tablet Take 1 tablet (30 mg total) by mouth daily.  . nitroGLYCERIN (NITROSTAT) 0.4 MG SL tablet Place 1 tablet (0.4 mg total) under the tongue every 5 (five) minutes as needed for chest pain (Up to 3 doses).  Marland Kitchen oxymetazoline (AFRIN) 0.05 % nasal spray Place 1 spray into both nostrils 2 (two) times daily as needed for congestion.  . pantoprazole (PROTONIX) 40 MG tablet Take 1 tablet (40 mg total) by mouth 2 (two) times daily.  . potassium chloride SA (KLOR-CON) 20 MEQ tablet Take 1 tablet (20 mEq total) by mouth 2 (two) times daily.  Marland Kitchen spironolactone (ALDACTONE) 25 MG tablet Take 1 tablet (25 mg total) by mouth daily.  Marland Kitchen torsemide (DEMADEX) 100 MG tablet Take 1 tablet (100 mg total) by mouth 2 (two) times daily.   No facility-administered encounter medications on file as of 12/17/2019.    Functional Status:  In your present state of health, do you have any difficulty performing the following activities: 12/10/2019 11/25/2019  Hearing? N N  Vision? N  N  Difficulty concentrating or making decisions? N N  Walking or climbing stairs? Y Y  Comment using walker -  Dressing or bathing? Y N  Comment wife helps -  Doing errands, shopping? Y N  Comment wife provides transportation Facilities manager and eating ? N -  Comment wife prepares meals -  Using the Toilet? N -  In the past six months, have you accidently leaked urine? N -  Do you have problems with loss of bowel control? N -  Managing your Medications? Y -  Comment  wife assist -  Managing your Finances? N -  Housekeeping or managing your Housekeeping? N -  Comment wife manages -  Some recent data might be hidden    Fall/Depression Screening: Fall Risk  12/17/2019  Falls in the past year? 0  Risk for fall due to : Impaired balance/gait  Follow up Falls prevention discussed   PHQ 2/9 Scores 12/17/2019  PHQ - 2 Score 0         Assessment:   Heart failure No worsening symptoms of heart failure, patient and wife will benefit from continued education and support on Heart failure management of daily care including  monitoring weight daily, signs symptoms of heart failure action plan and diet.   Appointment  Patient wife ask for assistance with scheduling initial visit with [facility] and wellness as patient was readmitted prior to scheduled appointment.     Plan Will continue weekly outreaches for transition care .  Placed call to [facility] and wellness to reschedule initial visit post hospital [place] care for primary care,  April 29 at 2:30 , returned call to wife she is agreeable to visit.  Will send patient [facility] calendar , High low salt handout , Heart failure packet.     Joylene Draft, RN, BSN  [place] Management Coordinator  [phone]- Mobile [phone]- Toll Free Main Office  [facility] CM Care Plan Problem One     Most Recent Value  Care Plan Problem One  High risk for readmission related to recent hospitalization for Heart failure exaceration   Role Documenting the Problem One  Care Management Coordinator  Care Plan for Problem One  Active  [facility] Long Term Goal   Patient will experience hospital readmission due Heart failure over the next 31 days   [facility] Long Term Goal Start Date  12/09/19  Interventions for Problem One Long Term Goal  Discussed current clinical symptoms, reviewed ongoing home health services in place. Reviewed worsening sympotms of heart failure sudden weight gain of 3 pounds in a  day , 5 in a week, shortness of breath swelling and action plan to notify MD cardiology. Began conversation on heart failure zones , will send HF packet     [facility] CM Short Term Goal #1   Patient will attend all medical appointments over the next 30 days   [facility] CM Short Term Goal #1 Start Date  12/09/19  Interventions for Short Term Goal #1  Assisted wife with scheduling PCP initial visit  Reinforced attending visit, verified she is able to provide transportation ,   [facility] CM Short Term Goal #2   Patient will weigh daily and keep a log over the next 30 days   [facility] CM Short Term Goal #2 Start Date  12/09/19  Interventions for Short Term Goal #2  Reinforced with patient importance of monitorind daily weights to identify sudden weight gain  as sign of worsening heart failure and  to seek sooner medical  follow up to avoid readmission   [facility] CM Short Term Goal #3  Over the next 30 days patient will be able to identify at least high salt food to avoid over the next 30 days.   [facility] CM Short Term Goal #3 Start Date  12/17/19  Interventions for Short Tern Goal #3  Discussed relationship of high salt foods and how to contribute to fluid weight gain, reviewed foods with higher salt content such as processed, canned foods. Discussed why barbeque may not be good choice for lower salt diet, Will send [facility] high low salt handout.

## 2019-12-18 ENCOUNTER — Ambulatory Visit: Payer: Medicare HMO | Admitting: Gastroenterology

## 2019-12-23 ENCOUNTER — Encounter (HOSPITAL_COMMUNITY): Payer: Medicare HMO

## 2019-12-23 DIAGNOSIS — E1151 Type 2 diabetes mellitus with diabetic peripheral angiopathy without gangrene: Secondary | ICD-10-CM | POA: Diagnosis not present

## 2019-12-23 DIAGNOSIS — T82858D Stenosis of vascular prosthetic devices, implants and grafts, subsequent encounter: Secondary | ICD-10-CM | POA: Diagnosis not present

## 2019-12-23 DIAGNOSIS — I5042 Chronic combined systolic (congestive) and diastolic (congestive) heart failure: Secondary | ICD-10-CM | POA: Diagnosis not present

## 2019-12-23 DIAGNOSIS — D631 Anemia in chronic kidney disease: Secondary | ICD-10-CM | POA: Diagnosis not present

## 2019-12-23 DIAGNOSIS — E1122 Type 2 diabetes mellitus with diabetic chronic kidney disease: Secondary | ICD-10-CM | POA: Diagnosis not present

## 2019-12-23 DIAGNOSIS — I4819 Other persistent atrial fibrillation: Secondary | ICD-10-CM | POA: Diagnosis not present

## 2019-12-23 DIAGNOSIS — I130 Hypertensive heart and chronic kidney disease with heart failure and stage 1 through stage 4 chronic kidney disease, or unspecified chronic kidney disease: Secondary | ICD-10-CM | POA: Diagnosis not present

## 2019-12-23 DIAGNOSIS — N184 Chronic kidney disease, stage 4 (severe): Secondary | ICD-10-CM | POA: Diagnosis not present

## 2019-12-23 DIAGNOSIS — I5082 Biventricular heart failure: Secondary | ICD-10-CM | POA: Diagnosis not present

## 2019-12-24 ENCOUNTER — Other Ambulatory Visit: Payer: Self-pay | Admitting: *Deleted

## 2019-12-24 ENCOUNTER — Other Ambulatory Visit: Payer: Self-pay | Admitting: Nurse Practitioner

## 2019-12-24 DIAGNOSIS — G8929 Other chronic pain: Secondary | ICD-10-CM

## 2019-12-24 DIAGNOSIS — M545 Low back pain, unspecified: Secondary | ICD-10-CM

## 2019-12-24 NOTE — Patient Outreach (Signed)
[facility]) Care Management  [place]  12/24/2019   AUGUSTUS ZURAWSKI 1940-09-28 [id number]   Transition of care  Referral received : 12/09/19 Referral source: Notification of Discharge from inpatient at [facility] on 12/05/19 Diagnosis : Heart Failure  Insurance: [place]   Patient is 79 year old male, with PMH that is significant for : chronic combined systolic diastolic CHF Biventricular failure , Cardiomyopathy, CAD, permanent Atrial fib, severe left PAD with know iliac artery occlusion,   Subjective:  Successful outreach call to patient wife Jadis Mika that patient has give verbal agreement that we may speak with.  She discussed patient recent problems with shoulder discomfort , and hips hurting. She remarks these complaints started a few days after getting 1st Covid vaccine, she is unsure if associated with . She reports notifying a nurse she is unsure whether is was [facility] or [facility]. She reports patient having a home health visit with therapy on yesterday, reports improvement in shoulder discomfort, he still has some discomfort in knee she is unable to recall which knee. She reports that his movement was better on yesterday afternoon. She reports heart failure clinic appointment for 4/19, rescheduled due to patient difficulty with movement over the weekend.  She reports due to discomfort in hip area he did not weigh over the last few days. She states that he is able to use walker to move about home to bathroom and kitchen today.  She denies patient having increase in swelling in legs or shortness of breath. She denies patient having fall at home. Wife states she is not at home at this time, states I can try patient at home but he may not answer the phone.   Unsuccessful outreach attempt to speak with patient no answer able to leave a HIPAA compliant message requesting return call.     Encounter Medications:  Outpatient Encounter  Medications as of 12/24/2019  Medication Sig  . acetaminophen (TYLENOL) 325 MG tablet Take 650 mg by mouth every 6 (six) hours as needed for mild pain.   Marland Kitchen albuterol (VENTOLIN HFA) 108 (90 Base) MCG/ACT inhaler Inhale 2 puffs into the lungs every 6 (six) hours as needed for wheezing or shortness of breath.  Marland Kitchen apixaban (ELIQUIS) 5 MG TABS tablet Take 1 tablet (5 mg total) by mouth 2 (two) times daily.  Marland Kitchen atorvastatin (LIPITOR) 80 MG tablet TAKE 1 TABLET BY MOUTH EVERY DAY  . Ensure (ENSURE) Take 237 mLs by mouth 2 (two) times daily as needed (nutritional supplement).  . ferrous sulfate 324 MG TBEC Take 324 mg by mouth in the morning and at bedtime.  . Fluticasone-Salmeterol (ADVAIR DISKUS) 250-50 MCG/DOSE AEPB Inhale 1 puff into the lungs 2 (two) times daily for 14 days. (Patient taking differently: Inhale 1 puff into the lungs 2 (two) times daily as needed (shortness of breath). )  . hydrALAZINE (APRESOLINE) 10 MG tablet TAKE 1 TABLET BY MOUTH TWICE A DAY  . isosorbide mononitrate (IMDUR) 30 MG 24 hr tablet Take 1 tablet (30 mg total) by mouth daily.  . nitroGLYCERIN (NITROSTAT) 0.4 MG SL tablet Place 1 tablet (0.4 mg total) under the tongue every 5 (five) minutes as needed for chest pain (Up to 3 doses).  Marland Kitchen oxymetazoline (AFRIN) 0.05 % nasal spray Place 1 spray into both nostrils 2 (two) times daily as needed for congestion.  . pantoprazole (PROTONIX) 40 MG tablet Take 1 tablet (40 mg total) by mouth 2 (two) times daily.  Marland Kitchen  potassium chloride SA (KLOR-CON) 20 MEQ tablet Take 1 tablet (20 mEq total) by mouth 2 (two) times daily.  Marland Kitchen spironolactone (ALDACTONE) 25 MG tablet Take 1 tablet (25 mg total) by mouth daily.  Marland Kitchen torsemide (DEMADEX) 100 MG tablet Take 1 tablet (100 mg total) by mouth 2 (two) times daily.   No facility-administered encounter medications on file as of 12/24/2019.    Functional Status:  In your present state of health, do you have any difficulty performing the following  activities: 12/10/2019 11/25/2019  Hearing? N N  Vision? N N  Difficulty concentrating or making decisions? N N  Walking or climbing stairs? Y Y  Comment using walker -  Dressing or bathing? Y N  Comment wife helps -  Doing errands, shopping? Y N  Comment wife provides transportation Facilities manager and eating ? N -  Comment wife prepares meals -  Using the Toilet? N -  In the past six months, have you accidently leaked urine? N -  Do you have problems with loss of bowel control? N -  Managing your Medications? Y -  Comment wife assist -  Managing your Finances? N -  Housekeeping or managing your Housekeeping? N -  Comment wife manages -  Some recent data might be hidden    Fall/Depression Screening: Fall Risk  12/17/2019  Falls in the past year? 0  Risk for fall due to : Impaired balance/gait  Follow up Falls prevention discussed   PHQ 2/9 Scores 12/17/2019  PHQ - 2 Score 0    Assessment:   Heart failure  Patient will need continued education and support on heart failure management, monitoring weights daily and identifying worsening symptoms and action plan .Heart failure clinic appointment rescheduled.   Appointments Heart failure Clinic on 4/22 Dr. Trula Slade  On 2/26 Community health and wellness ,initial visit to establish for primary care on 01/02/20.  Plan:  Will plan call to [facility] for collaboration, spoke with Lattie Haw, discussed patient recent complaints , she reports [facility] occupational  therapy visit on Sunday and patient not participating in therapy, she reports her visit earlier in the week patient was without complaints of pain no worsening heart failure.  Will plan return call to patient in the next week.   [facility] CM Care Plan Problem One     Most Recent Value  Care Plan Problem One  High risk for readmission related to recent hospitalization for Heart failure exaceration   Role Documenting the Problem One  Care Management Coordinator  Care Plan for  Problem One  Active  [facility] Long Term Goal   Patient will experience hospital readmission due Heart failure over the next 31 days   [facility] Long Term Goal Start Date  12/09/19  Interventions for Problem One Long Term Goal  Reviewed current clinical condition reviewed importance of making contact with medical team for new concerns.   [facility] CM Short Term Goal #1   Patient will attend all medical appointments over the next 30 days   [facility] CM Short Term Goal #1 Start Date  12/09/19  Interventions for Short Term Goal #1  Reviewed with wife upcoming appointment and confirmed her being able to provide transportation. Encouraged to take patient medication bottles for review   [facility] CM Short Term Goal #2   Patient will weigh daily and keep a log over the next 30 days   [facility] CM Short Term Goal #2 Start Date  12/09/19  Interventions for Short Term Goal #  2  Encouraged patient wife, importance of keeping track of daily weights to identify sudden weight gain on day to day basis, again reviewed best time of day to weigh  [facility] CM Short Term Goal #3  Over the next 30 days patient will be able to identify at least high salt food to avoid over the next 30 days.   [facility] CM Short Term Goal #3 Start Date  12/17/19  Interventions for Short Tern Goal #3  Reinforced high salt and processed foods to limit in diet, encouraged to read high low salt handout. ,Discussed reasoning to limit salt with heart condition  [facility] CM Short Term Goal #4  Over the next 30 days patient will be able to identify 2 symptoms in yellow zone of heart failure to be reported to provider   [facility] CM Short Term Goal #4 Start Date  12/24/19  Interventions for Short Term Goal #4  Educated patient wife on heart failure zones,referring to Zone chart in [facility] calendar she has per report,  emphasizing yellow zone symptoms and importance of notifying MD of noted symptoms for follow up to avoid hospital readmission.       Joylene Draft, RN, BSN  [place]  Management Coordinator  [phone]- Mobile [phone]- Toll Free Main Office

## 2019-12-26 ENCOUNTER — Encounter (HOSPITAL_COMMUNITY): Payer: Self-pay

## 2019-12-26 ENCOUNTER — Ambulatory Visit (HOSPITAL_COMMUNITY)
Admission: RE | Admit: 2019-12-26 | Discharge: 2019-12-26 | Disposition: A | Discharge status: Home / Self Care | Payer: Medicare HMO | Source: Ambulatory Visit | Attending: Adult Health | Admitting: Adult Health

## 2019-12-26 ENCOUNTER — Other Ambulatory Visit: Payer: Self-pay

## 2019-12-26 ENCOUNTER — Other Ambulatory Visit (HOSPITAL_COMMUNITY): Payer: Self-pay

## 2019-12-26 VITALS — BP 102/65 | HR 102 | Wt 154.4 lb

## 2019-12-26 DIAGNOSIS — I272 Pulmonary hypertension, unspecified: Secondary | ICD-10-CM | POA: Insufficient documentation

## 2019-12-26 DIAGNOSIS — I5082 Biventricular heart failure: Secondary | ICD-10-CM | POA: Insufficient documentation

## 2019-12-26 DIAGNOSIS — I255 Ischemic cardiomyopathy: Secondary | ICD-10-CM

## 2019-12-26 DIAGNOSIS — N183 Chronic kidney disease, stage 3 unspecified: Secondary | ICD-10-CM | POA: Diagnosis not present

## 2019-12-26 DIAGNOSIS — N1832 Chronic kidney disease, stage 3b: Secondary | ICD-10-CM | POA: Insufficient documentation

## 2019-12-26 DIAGNOSIS — I2510 Atherosclerotic heart disease of native coronary artery without angina pectoris: Secondary | ICD-10-CM | POA: Diagnosis not present

## 2019-12-26 DIAGNOSIS — I482 Chronic atrial fibrillation, unspecified: Secondary | ICD-10-CM | POA: Diagnosis not present

## 2019-12-26 DIAGNOSIS — I5021 Acute systolic (congestive) heart failure: Secondary | ICD-10-CM

## 2019-12-26 DIAGNOSIS — I4819 Other persistent atrial fibrillation: Secondary | ICD-10-CM | POA: Diagnosis not present

## 2019-12-26 DIAGNOSIS — Z8249 Family history of ischemic heart disease and other diseases of the circulatory system: Secondary | ICD-10-CM | POA: Diagnosis not present

## 2019-12-26 DIAGNOSIS — I745 Embolism and thrombosis of iliac artery: Secondary | ICD-10-CM | POA: Diagnosis not present

## 2019-12-26 DIAGNOSIS — Z79899 Other long term (current) drug therapy: Secondary | ICD-10-CM | POA: Insufficient documentation

## 2019-12-26 DIAGNOSIS — I5022 Chronic systolic (congestive) heart failure: Secondary | ICD-10-CM

## 2019-12-26 DIAGNOSIS — I739 Peripheral vascular disease, unspecified: Secondary | ICD-10-CM | POA: Insufficient documentation

## 2019-12-26 DIAGNOSIS — Z8674 Personal history of sudden cardiac arrest: Secondary | ICD-10-CM | POA: Diagnosis not present

## 2019-12-26 DIAGNOSIS — Z87891 Personal history of nicotine dependence: Secondary | ICD-10-CM | POA: Insufficient documentation

## 2019-12-26 DIAGNOSIS — Z7901 Long term (current) use of anticoagulants: Secondary | ICD-10-CM | POA: Insufficient documentation

## 2019-12-26 DIAGNOSIS — E782 Mixed hyperlipidemia: Secondary | ICD-10-CM | POA: Insufficient documentation

## 2019-12-26 DIAGNOSIS — Z8673 Personal history of transient ischemic attack (TIA), and cerebral infarction without residual deficits: Secondary | ICD-10-CM | POA: Diagnosis not present

## 2019-12-26 DIAGNOSIS — Z955 Presence of coronary angioplasty implant and graft: Secondary | ICD-10-CM | POA: Diagnosis not present

## 2019-12-26 DIAGNOSIS — D649 Anemia, unspecified: Secondary | ICD-10-CM | POA: Insufficient documentation

## 2019-12-26 DIAGNOSIS — I130 Hypertensive heart and chronic kidney disease with heart failure and stage 1 through stage 4 chronic kidney disease, or unspecified chronic kidney disease: Secondary | ICD-10-CM | POA: Diagnosis not present

## 2019-12-26 DIAGNOSIS — R0602 Shortness of breath: Secondary | ICD-10-CM | POA: Insufficient documentation

## 2019-12-26 DIAGNOSIS — R0600 Dyspnea, unspecified: Secondary | ICD-10-CM | POA: Diagnosis not present

## 2019-12-26 LAB — CBC
HCT: 26.2 % — ABNORMAL LOW (ref 39.0–52.0)
Hemoglobin: 8.8 g/dL — ABNORMAL LOW (ref 13.0–17.0)
MCH: 24.6 pg — ABNORMAL LOW (ref 26.0–34.0)
MCHC: 33.6 g/dL (ref 30.0–36.0)
MCV: 73.4 fL — ABNORMAL LOW (ref 80.0–100.0)
Platelets: 261 10*3/uL (ref 150–400)
RBC: 3.57 MIL/uL — ABNORMAL LOW (ref 4.22–5.81)
RDW: 19.7 % — ABNORMAL HIGH (ref 11.5–15.5)
WBC: 5.3 10*3/uL (ref 4.0–10.5)
nRBC: 0.4 % — ABNORMAL HIGH (ref 0.0–0.2)

## 2019-12-26 LAB — BASIC METABOLIC PANEL
Anion gap: 11 (ref 5–15)
BUN: 55 mg/dL — ABNORMAL HIGH (ref 8–23)
CO2: 25 mmol/L (ref 22–32)
Calcium: 8.9 mg/dL (ref 8.9–10.3)
Chloride: 92 mmol/L — ABNORMAL LOW (ref 98–111)
Creatinine, Ser: 2.54 mg/dL — ABNORMAL HIGH (ref 0.61–1.24)
GFR calc Af Amer: 27 mL/min — ABNORMAL LOW (ref >60)
GFR calc non Af Amer: 24 mL/min — ABNORMAL LOW (ref >60)
Glucose, Bld: 105 mg/dL — ABNORMAL HIGH (ref 70–99)
Potassium: 4.5 mmol/L (ref 3.5–5.1)
Sodium: 128 mmol/L — ABNORMAL LOW (ref 135–145)

## 2019-12-26 LAB — BRAIN NATRIURETIC PEPTIDE: B Natriuretic Peptide: 1843.7 pg/mL — ABNORMAL HIGH (ref 0.0–100.0)

## 2019-12-26 MED ORDER — NITROGLYCERIN 0.4 MG SL SUBL: 0.4000 mg | SUBLINGUAL_TABLET | SUBLINGUAL | 4 refills | Status: AC | PRN

## 2019-12-26 MED ORDER — METOPROLOL SUCCINATE ER 25 MG PO TB24: 12.5000 mg | ORAL_TABLET | Freq: Every day | ORAL | 3 refills | Status: AC

## 2019-12-26 NOTE — Progress Notes (Signed)
.  PCP: Primary Cardiologist: Dr Haroldine Laws Vascular: Dr Trula Slade   HPI: 79 y/o male with chronic combined systolic/ diastolic CHF/ biventricular failure 2/2 ischemic CM, CAD s/p prior PCI of the LAD and LCx, remote h/o VF arrest x 2 (first in 2013 in the setting of hypokalemia (K of 2.2) and again in 2014 in setting of MI), no ICD, h/o permanent atrial fibrillation on chronic a/c w/ apxiaban, CKD (basline SCr ~ 2.0-2.3) and severe LE PAD w/ knownR external iliac artery occlusion with severe infrainguinal arterial occlusive disease and peroneal runoff only. L SFA occlusion with diffuse infrainguinal arterial occlusive disease, being followed by [facility] and awaiting surgical revascularization once medically cleared. Of note, he underwent LE arterial angiography on 3/16. Being considered forbilateral femoral endarterectomy and left to right femoral-femoral bypass.  Admitted with A/C Biventricular HF on 11/25/19. Diuresed with lasix drip and later transitioned to torsemide 100 mg twice a day. Diuresed > 40 pounds Had RHC that showed elevated biventricular filling pressures and with equalization of R &L sided diastolic pressuresand preserved CO consistent withrestrictive physiology. Discharge weight was 153 pounds.   Today he returns for HF follow up.Overall feeling ok but admits to leg fatigue with ambulation. Not walking very much. Mild SOB with exertion.  Denies PND/Orthopnea. Appetite ok. No bleeding issues. No fever or chills. Weight at home 144-145 pounds. Taking all medications. Followed  By [facility] --> RN/PT.   [place] 11/28/19  11/28/19 RA = 23 RV = 58/22 PA = 56/21 (38) PCW = 24 Fick cardiac output/index = 6.4/2.9 PVR = 2.2 WU Ao sat = 95% PA sat = 59%. 60% Assessment: 1. Severely elevated biventricular filling pressures with equalization of R &L sided diastolic pressures suggestive of restrictive physiology.   ECHO 09/2019 LVEF 25-30%, global hypokinesis, mild LVH, RV mildly enlarged  w/ severely reduced systolic function. Moderate MR. Mild TR. Severe bi atrial enlargement. [place] 1/21 with following findings:   Moderate pulmonary hypertension with mean PA pressure of 42 mmHg.  Mean pulmonary capillary wedge pressure 28 mmHg.  Phasic pulmonary artery pressure 63/29 mmHg, mean RA pressure 20 mmHg, and pulmonary artery pulsatility index is 1.7 (suggesting RV dysfunction)  Mixed venous O2 saturation 57%   ROS: All systems negative except as listed in HPI, PMH and Problem List.  SH:  Social History   Socioeconomic History  . Marital status: Married    Spouse name: Not on file  . Number of children: 4  . Years of education: Not on file  . Highest education level: Not on file  Occupational History  . 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 Smoking status: Former Smoker    Packs/day: 0.50    Years: 30.00    Pack years: 15.00    Types: Cigarettes    Quit date: 03/12/2012    Years since quitting: 7.7  . Smokeless tobacco: Never Used  Substance and Sexual Activity  . Alcohol use: No    Alcohol/week: 0.0 standard drinks  . Drug use: No    Frequency: 2.0 times per week  . Sexual activity: Not on file  Other Topics Concern  . 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 YES. 1/2 -1 PPD OF CIGARETTES    NO ETOH   Social Determinants of Health   Financial  Resource Strain:   . Difficulty of Paying Living Expenses:   Food Insecurity: No Food Insecurity  . Worried About Charity fundraiser in the Last Year: Never true  . Ran Out of Food in the Last Year: Never true  Transportation Needs: No Transportation Needs  . Lack of Transportation (Medical): No  . Lack of Transportation (Non-Medical): No  Physical Activity:   . Days of Exercise per Week:   . Minutes of Exercise per Session:   Stress:   . Feeling of Stress :     Social Connections:   . Frequency of Communication with Friends and Family:   . Frequency of Social Gatherings with Friends and Family:   . Attends Religious Services:   . Active Member of Clubs or Organizations:   . Attends Archivist Meetings:   Marland Kitchen Marital Status:   Intimate Partner Violence:   . Fear of Current or Ex-Partner:   . Emotionally Abused:   Marland Kitchen Physically Abused:   . Sexually Abused:     FH:  Family History  Problem Relation Age of Onset  . Heart disease Father        ALSO UNCLE DIED HAD CAD  . Heart failure Father   . Brain cancer Mother   . Leukemia Mother   . Sickle cell anemia Mother   . Sickle cell anemia Other     Past Medical History:  Diagnosis Date  . AAA (abdominal aortic aneurysm) ([place])    a. 08/2015 Abd U/S: 2.7 x 2.9 x 3.2 cm infrarenal AAA.  . Bradycardia    a. Requiring discontinuation of use of BB/CCB  . Cardiac arrest - ventricular fibrillation    a. 03/2012 in setting of hypokalemia (prolonged hosp with VDRF, tracheobronchitis, ARF, shock liver, PAF, AMS felt secondary to post-anoxic encephalopathy/shock).  . Carotid artery stenosis    a. 01/2011 - 40-59% bilateral stenosis;  b. 06/2015 Carotid U/S: 40-59% bilat ICA stenosis->f/u 6 mos.  . CKD (chronic kidney disease), stage III   . Coronary artery disease    a. s/p aborted ant STEMI tx with Cypher DES to LAD 10/04 (residual at cath: D1 50%, CFX 40% and multiple dist 70%, EF 55%);   b. myoview 3/10: Ef 47%, infero-apical isch, LOW RISK - med Tx recommended;  c. 12/2012 VF Arrest/Cath: LAD 40isr, 80 apical, LCX 100 (failed PTCA), RCA nondom.  . Diabetes mellitus   . Diverticulosis 2001  . Elevated LFTs    Shock liver 03/2012  . H/O: CVA (cardiovascular accident)   . Hematemesis    a. 12/2010 felt 2/2 Mallory Weiss tear - pt could not afford colonscopy/EGD at that time so Coumadin was deferred. Coumadin initiated 03/2012 without any evidence for bleeding.  Marland Kitchen History of Ischemic  Cardiomyopathy    a. EF 50-55% by echo 03/09/12 (was 30% by echo 02/24/12); b. 03/2013 Echo: EF 55-60%, mild MR, sev dil LA, mod dil RA, PASP 70mmHg.  Marland Kitchen Hypertensive heart disease    a. echo 4/12: EF 50%, asymmetric septal hypertrophy, no SAM or LVOT gradient, LAE, PASP 35  . Inguinal hernia   . 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 Mitral regurgitation    a. Mild by echo 03/2012 and 03/2013.  . Mixed Hyperlipidemia   . Peripheral vascular disease ([place])    a. h/o LE angioplasty;  b. 08/2015 Duplex: >50% RCIA, 100% REIA, >50% LEIA, elev vel in SMA, patent IVC.  Marland Kitchen Persistent atrial fibrillation ([place])  a. Dx 12/2010-->coumadin (CHA2DS2VASc = 7).  . QT prolongation     Current Outpatient Medications  Medication Sig Dispense Refill  . acetaminophen (TYLENOL) 325 MG tablet Take 650 mg by mouth every 6 (six) hours as needed for mild pain.     Marland Kitchen albuterol (VENTOLIN HFA) 108 (90 Base) MCG/ACT inhaler Inhale 2 puffs into the lungs every 6 (six) hours as needed for wheezing or shortness of breath. 90 g 3  . apixaban (ELIQUIS) 5 MG TABS tablet Take 1 tablet (5 mg total) by mouth 2 (two) times daily. 60 tablet 0  . atorvastatin (LIPITOR) 80 MG tablet TAKE 1 TABLET BY MOUTH EVERY DAY 90 tablet 3  . Ensure (ENSURE) Take 237 mLs by mouth 2 (two) times daily as needed (nutritional supplement).    . ferrous sulfate 324 MG TBEC Take 324 mg by mouth in the morning and at bedtime.    . hydrALAZINE (APRESOLINE) 10 MG tablet TAKE 1 TABLET BY MOUTH TWICE A DAY 180 tablet 3  . isosorbide mononitrate (IMDUR) 30 MG 24 hr tablet Take 1 tablet (30 mg total) by mouth daily. 30 tablet 1  . nitroGLYCERIN (NITROSTAT) 0.4 MG SL tablet Place 1 tablet (0.4 mg total) under the tongue every 5 (five) minutes as needed for chest pain (Up to 3 doses). 25 tablet 4  . oxymetazoline (AFRIN) 0.05 % nasal spray Place 1 spray into both nostrils 2 (two) times daily as needed for congestion.    .  pantoprazole (PROTONIX) 40 MG tablet Take 1 tablet (40 mg total) by mouth 2 (two) times daily. 60 tablet 0  . spironolactone (ALDACTONE) 25 MG tablet Take 1 tablet (25 mg total) by mouth daily. 30 tablet 6  . torsemide (DEMADEX) 100 MG tablet Take 1 tablet (100 mg total) by mouth 2 (two) times daily. 120 tablet 2  . Fluticasone-Salmeterol (ADVAIR DISKUS) 250-50 MCG/DOSE AEPB Inhale 1 puff into the lungs 2 (two) times daily for 14 days. (Patient taking differently: Inhale 1 puff into the lungs 2 (two) times daily as needed (shortness of breath). ) 28 each 0  . potassium chloride SA (KLOR-CON) 20 MEQ tablet Take 1 tablet (20 mEq total) by mouth 2 (two) times daily. 120 tablet 0   No current facility-administered medications for this encounter.    Vitals:   12/26/19 1446  BP: 102/65  Pulse: (!) 102  SpO2: 99%  Weight: 70 kg   Wt Readings from Last 3 Encounters:  12/26/19 70 kg  12/09/19 76.2 kg  12/05/19 69.4 kg   PHYSICAL EXAM: General: Appears fatigued. No resp difficulty. Thin  HEENT: normal Neck: supple. JVP flat. Carotids 2+ bilaterally; no bruits. No lymphadenopathy or thryomegaly appreciated. Cor: PMI normal. Irregular rate & rhythm. No rubs, gallops or murmurs. Lungs: clear Abdomen: soft, nontender, nondistended. No hepatosplenomegaly. No bruits or masses. Good bowel sounds. Extremities: no cyanosis, clubbing, rash, edema Neuro: alert & orientedx3, cranial nerves grossly intact. Moves all 4 extremities w/o difficulty. Affect pleasant.   ECG: A fib 101 bpm   ASSESSMENT & PLAN: 1. Chronic Biventricular CHF  - known ICM dating back to 2013.  - Echo 1/21showed LVEF 25-30%, global hypokinesis, mild LVH, RV mildly enlarged w/ severely reduced systolic function. Moderate MR. Mild TR. Severe bi atrial enlargement. - RHC on 3/25 showed elevated biventricular filling pressures and with equalization of R &L sided diastolic pressuresand preserved CO consistent withrestrictive  physiology. - NYHA III. Volume status stable. Continue torsemide 100 mg twice a day.  -  Add 12.5 mg Toprol XL at bed time.  - SGLT2i next visit.  - Continue spiro 25 mg daily.   - Check BMET and BNP   2. CKD IIIb/IV:  - Creatinine baseline 1.7-1.9  -Check BMET   3. CAD:  - prior LAD and LCx stenting - No chest pain.  - no ASA due to apixaban - continue high dose statin - No ?blocker given ow output   4. PAD w/ Claudication  - knownR external iliac artery occlusion with severe infrainguinal arterial occlusive disease and peroneal runoff only. L SFA occlusion with diffuse infrainguinal arterial occlusive disease, being followed by [facility] and awaiting surgical revascularization once medically cleared. From [facility]-->Once he is cleared from a medical standpoint we can try to schedule his surgery.  Dr. Trula Slade plans on a left iliac stent, bilateral femoral endarterectomies, and a left to right femorofemoral bypass.  - Denies leg pain today.   5.  Chronic AF - Rate controlled.  - continue apixaban.  - Check CBC   6. Anemia , Iron Deficient  - Received Feraheme 3/29.  - Resume iron.  - Check CBC  Follow up in 3-4 weeks. Discussed medication changes.  Yoltzin Barg NP-C  4:08 PM

## 2019-12-26 NOTE — Patient Instructions (Signed)
START Metoprolol 12.5mg , one half tab daily at bedtime  Labs today We will only contact you if something comes back abnormal or we need to make some changes. Otherwise no news is good news!   Your physician recommends that you schedule a follow-up appointment in: 3-4 weeks  in the Advanced Practitioners (PA/NP) Clinic   Your physician recommends that you schedule a follow-up appointment in: 8 weeks with Dr Haroldine Laws  Do the following things EVERYDAY: 1) Weigh yourself in the morning before breakfast. Write it down and keep it in a log. 2) Take your medicines as prescribed 3) Eat low salt foods--Limit salt (sodium) to 2000 mg per day.  4) Stay as active as you can everyday 5) Limit all fluids for the day to less than 2 liters  At the [facility], you and your health needs are our priority. As part of our continuing mission to provide you with exceptional heart care, we have created designated Provider Care Teams. These Care Teams include your primary Cardiologist (physician) and Advanced Practice Providers (APPs- Physician Assistants and Nurse Practitioners) who all work together to provide you with the care you need, when you need it.   You may see any of the following providers on your designated Care Team at your next follow up: Marland Kitchen Dr Glori Bickers . Dr Loralie Champagne . Darrick Grinder, NP . Lyda Jester, PA . Audry Riles, PharmD   Please be sure to bring in all your medications bottles to every appointment.

## 2019-12-27 ENCOUNTER — Telehealth (HOSPITAL_COMMUNITY): Payer: Self-pay

## 2019-12-27 DIAGNOSIS — I130 Hypertensive heart and chronic kidney disease with heart failure and stage 1 through stage 4 chronic kidney disease, or unspecified chronic kidney disease: Secondary | ICD-10-CM | POA: Diagnosis not present

## 2019-12-27 DIAGNOSIS — I4819 Other persistent atrial fibrillation: Secondary | ICD-10-CM | POA: Diagnosis not present

## 2019-12-27 DIAGNOSIS — I5042 Chronic combined systolic (congestive) and diastolic (congestive) heart failure: Secondary | ICD-10-CM | POA: Diagnosis not present

## 2019-12-27 DIAGNOSIS — E1122 Type 2 diabetes mellitus with diabetic chronic kidney disease: Secondary | ICD-10-CM | POA: Diagnosis not present

## 2019-12-27 DIAGNOSIS — E1151 Type 2 diabetes mellitus with diabetic peripheral angiopathy without gangrene: Secondary | ICD-10-CM | POA: Diagnosis not present

## 2019-12-27 DIAGNOSIS — D631 Anemia in chronic kidney disease: Secondary | ICD-10-CM | POA: Diagnosis not present

## 2019-12-27 DIAGNOSIS — N184 Chronic kidney disease, stage 4 (severe): Secondary | ICD-10-CM | POA: Diagnosis not present

## 2019-12-27 DIAGNOSIS — T82858D Stenosis of vascular prosthetic devices, implants and grafts, subsequent encounter: Secondary | ICD-10-CM | POA: Diagnosis not present

## 2019-12-27 DIAGNOSIS — I5082 Biventricular heart failure: Secondary | ICD-10-CM | POA: Diagnosis not present

## 2019-12-27 NOTE — Telephone Encounter (Signed)

## 2019-12-30 ENCOUNTER — Encounter: Payer: Self-pay | Admitting: Surgery

## 2019-12-30 ENCOUNTER — Other Ambulatory Visit: Payer: Self-pay | Admitting: Cardiology

## 2019-12-30 ENCOUNTER — Ambulatory Visit (INDEPENDENT_AMBULATORY_CARE_PROVIDER_SITE_OTHER): Payer: Medicare HMO | Admitting: Surgery

## 2019-12-30 ENCOUNTER — Other Ambulatory Visit: Payer: Self-pay

## 2019-12-30 VITALS — BP 96/59 | HR 81 | Temp 97.7°F | Resp 20 | Ht 75.5 in | Wt 154.0 lb

## 2019-12-30 DIAGNOSIS — I7025 Atherosclerosis of native arteries of other extremities with ulceration: Secondary | ICD-10-CM | POA: Diagnosis not present

## 2019-12-30 NOTE — Progress Notes (Signed)
[facility]  Patient name: Logan White MRN: [id number] DOB: [birth year] Sex: male   REASON FOR VISIT:    Follow up  HISOTRY OF PRESENT ILLNESS:   Logan White is a 79 y.o. male who I met on 11/15/2019 when the patient was having complaints of right leg pain.  He has a known right external iliac occlusion with severe infrainguinal occlusive disease on the right.  This was diagnosed by angiography from Dr. Alvester Chou in the past.  I repeated his arteriogram during his stay which confirmed the above findings.  I was planning on a left to right femoral-femoral bypass graft and left iliac stenting to get him out of rest pain.  He was in the hospital for heart failure issues and was deemed a prohibitive operative risk.  He continued to get diuresed and was ultimately discharged from the hospital this past weekend.  He is on anticoagulation for his atrial fibrillation which also helps his wrist pain.  Currently he states that he is not having any leg pain.  He is not doing hardly any walking because of his shortness of breath and generalized weakness.  The patient does have a small open wound on the inside of his left great toe.  He has chronic renal insufficiency.  His creatinine has been stable, around 3.  He suffers from diabetes and hyperlipidemia.  He is currently on home oxygen.   PAST MEDICAL HISTORY:   Past Medical History:  Diagnosis Date  . AAA (abdominal aortic aneurysm) ([place])    a. 08/2015 Abd U/S: 2.7 x 2.9 x 3.2 cm infrarenal AAA.  . Bradycardia    a. Requiring discontinuation of use of BB/CCB  . Cardiac arrest - ventricular fibrillation    a. 03/2012 in setting of hypokalemia (prolonged hosp with VDRF, tracheobronchitis, ARF, shock liver, PAF, AMS felt secondary to post-anoxic encephalopathy/shock).  . Carotid artery stenosis    a. 01/2011 - 40-59% bilateral stenosis;  b. 06/2015 Carotid U/S: 40-59% bilat ICA  stenosis->f/u 6 mos.  . CKD (chronic kidney disease), stage III   . Coronary artery disease    a. s/p aborted ant STEMI tx with Cypher DES to LAD 10/04 (residual at cath: D1 50%, CFX 40% and multiple dist 70%, EF 55%);   b. myoview 3/10: Ef 47%, infero-apical isch, LOW RISK - med Tx recommended;  c. 12/2012 VF Arrest/Cath: LAD 40isr, 80 apical, LCX 100 (failed PTCA), RCA nondom.  . Diabetes mellitus   . Diverticulosis 2001  . Elevated LFTs    Shock liver 03/2012  . H/O: CVA (cardiovascular accident)   . Hematemesis    a. 12/2010 felt 2/2 Mallory Weiss tear - pt could not afford colonscopy/EGD at that time so Coumadin was deferred. Coumadin initiated 03/2012 without any evidence for bleeding.  Marland Kitchen History of Ischemic Cardiomyopathy    a. EF 50-55% by echo 03/09/12 (was 30% by echo 02/24/12); b. 03/2013 Echo: EF 55-60%, mild MR, sev dil LA, mod dil RA, PASP 56mHg.  .Marland KitchenHypertensive heart disease    a. echo 4/12: EF 50%, asymmetric septal hypertrophy, no SAM or LVOT gradient, LAE, PASP 35  . Inguinal hernia   . 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 Mitral regurgitation    a. Mild by echo 03/2012 and 03/2013.  . Mixed Hyperlipidemia   . Peripheral vascular disease ([place]    a. h/o LE angioplasty;  b. 08/2015 Duplex: >50% RCIA, 100% REIA, >50% LEIA, elev vel  in SMA, patent IVC.  Marland Kitchen Persistent atrial fibrillation ([place])    a. Dx 12/2010-->coumadin (CHA2DS2VASc = 7).  . QT prolongation      FAMILY HISTORY:   Family History  Problem Relation Age of Onset  . Heart disease Father        ALSO UNCLE DIED HAD CAD  . Heart failure Father   . Brain cancer Mother   . Leukemia Mother   . Sickle cell anemia Mother   . Sickle cell anemia Other     SOCIAL HISTORY:   Social History   Tobacco Use  . Smoking status: Former Smoker    Packs/day: 0.50    Years: 30.00    Pack years: 15.00    Types: Cigarettes    Quit date: 03/12/2012    Years since quitting: 7.8  .  Smokeless tobacco: Never Used  Substance Use Topics  . Alcohol use: No    Alcohol/week: 0.0 standard drinks     ALLERGIES:   No Known Allergies   CURRENT MEDICATIONS:   Current Outpatient Medications  Medication Sig Dispense Refill  . acetaminophen (TYLENOL) 325 MG tablet Take 650 mg by mouth every 6 (six) hours as needed for mild pain.     Marland Kitchen albuterol (VENTOLIN HFA) 108 (90 Base) MCG/ACT inhaler Inhale 2 puffs into the lungs every 6 (six) hours as needed for wheezing or shortness of breath. 90 g 3  . apixaban (ELIQUIS) 5 MG TABS tablet Take 1 tablet (5 mg total) by mouth 2 (two) times daily. 60 tablet 0  . atorvastatin (LIPITOR) 80 MG tablet TAKE 1 TABLET BY MOUTH EVERY DAY 90 tablet 3  . Ensure (ENSURE) Take 237 mLs by mouth 2 (two) times daily as needed (nutritional supplement).    . ferrous sulfate 324 MG TBEC Take 324 mg by mouth in the morning and at bedtime.    . hydrALAZINE (APRESOLINE) 10 MG tablet TAKE 1 TABLET BY MOUTH TWICE A DAY 180 tablet 3  . isosorbide mononitrate (IMDUR) 30 MG 24 hr tablet Take 1 tablet (30 mg total) by mouth daily. 30 tablet 1  . metoprolol succinate (TOPROL-XL) 25 MG 24 hr tablet Take 0.5 tablets (12.5 mg total) by mouth at bedtime. Take with or immediately following a meal. 45 tablet 3  . nitroGLYCERIN (NITROSTAT) 0.4 MG SL tablet Place 1 tablet (0.4 mg total) under the tongue every 5 (five) minutes as needed for chest pain (Up to 3 doses). 25 tablet 4  . oxymetazoline (AFRIN) 0.05 % nasal spray Place 1 spray into both nostrils 2 (two) times daily as needed for congestion.    . pantoprazole (PROTONIX) 40 MG tablet Take 1 tablet (40 mg total) by mouth 2 (two) times daily. 60 tablet 0  . spironolactone (ALDACTONE) 25 MG tablet Take 1 tablet (25 mg total) by mouth daily. 30 tablet 6  . torsemide (DEMADEX) 100 MG tablet Take 1 tablet (100 mg total) by mouth 2 (two) times daily. 120 tablet 2  . Fluticasone-Salmeterol (ADVAIR DISKUS) 250-50 MCG/DOSE AEPB  Inhale 1 puff into the lungs 2 (two) times daily for 14 days. (Patient taking differently: Inhale 1 puff into the lungs 2 (two) times daily as needed (shortness of breath). ) 28 each 0  . potassium chloride SA (KLOR-CON) 20 MEQ tablet Take 1 tablet (20 mEq total) by mouth 2 (two) times daily. 120 tablet 0   No current facility-administered medications for this visit.    REVIEW OF SYSTEMS:   '[X]'  denotes positive  finding, '[ ]'  denotes negative finding Cardiac  Comments:  Chest pain or chest pressure:    Shortness of breath upon exertion: x   Short of breath when lying flat: x   Irregular heart rhythm:        Vascular    Pain in calf, thigh, or hip brought on by ambulation:    Pain in feet at night that wakes you up from your sleep:     Blood clot in your veins:    Leg swelling:         Pulmonary    Oxygen at home: x   Productive cough:     Wheezing:         Neurologic    Sudden weakness in arms or legs:     Sudden numbness in arms or legs:     Sudden onset of difficulty speaking or slurred speech:    Temporary loss of vision in one eye:     Problems with dizziness:         Gastrointestinal    Blood in stool:     Vomited blood:         Genitourinary    Burning when urinating:     Blood in urine:        Psychiatric    Major depression:         Hematologic    Bleeding problems:    Problems with blood clotting too easily:        Skin    Rashes or ulcers: x       Constitutional    Fever or chills:      PHYSICAL EXAM:   Vitals:   12/30/19 1549  BP: (!) 96/59  Pulse: 81  Resp: 20  Temp: 97.7 F (36.5 C)  SpO2: 98%  Weight: 154 lb (69.9 kg)  Height: 6' 3.5" (1.918 m)    GENERAL: The patient is a well-nourished male, in no acute distress. The vital signs are documented above. CARDIAC: There is a regular rate and rhythm.  VASCULAR: Nonpalpable pedal pulses PULMONARY: Non-labored respirations MUSCULOSKELETAL: There are no major deformities or  cyanosis. NEUROLOGIC: No focal weakness or paresthesias are detected. SKIN: Superficial ulceration on the medial aspect of the right great toe measuring approximately 5 mm.  No surrounding erythema or drainage PSYCHIATRIC: The patient has a normal affect.  STUDIES:   none  MEDICAL ISSUES:   Atherosclerotic lower extremity vascular disease with nonhealing wounds: The patient's wound on the right great toe appears stable without evidence of infection.  He is at very high risk for surgical morbidity and mortality based on his underlying cardiac issues.  If I proceed with surgery he would need a left to right femoral-femoral bypass graft with femoral endarterectomy and femoral-popliteal bypass graft to the right leg.  I think this is a lot for him.  And I am not sure he would tolerate it.  He has not had any deterioration or change in the wound on his toe.  Hopefully this could heal without intervention.  I will see him back in 3 weeks.  If it has not improved or has gotten worse, we will need to make a decision on optimal management    Leia Alf, MD, FACS [facility] [phone] Pager [phone]

## 2020-01-01 ENCOUNTER — Other Ambulatory Visit: Payer: Self-pay | Admitting: *Deleted

## 2020-01-01 NOTE — Progress Notes (Signed)
Patient ID: Logan White, male   DOB: [birth year], 79 y.o.   MRN: [id number]   Virtual Visit via Telephone Note  I connected with Ronni Rumble on 01/02/20 at  2:30 PM EDT by telephone and verified that I am speaking with the correct person using two identifiers.   I discussed the limitations, risks, security and privacy concerns of performing an evaluation and management service by telephone and the availability of in person appointments. I also discussed with the patient that there may be a patient responsible charge related to this service. The patient expressed understanding and agreed to proceed.  PATIENT visit by telephone virtually in the context of Covid-19 pandemic. Patient location:  home My Location:  [facility] office Persons on the call:  Me the patient and his wife(Patricia)     History of Present Illness: After hospitalization 3/22-12/05/2019.  He is feeling ok since hospitalization.  He has already followed up with vascular surgery.  They do not feel he is a good candidate at this time for surgery (Being considered forbilateral femoral endarterectomy and left to right femoral-femoral bypass.).  He has had some intermittent N/V but no episodes in >1 week.  Wife is assisting him with ADL and he is using a walker around the house.  No CP.  No SOB currently.  Home weights stable around 150.  Appetite is fair to good.  No fever.  Needs RF on spironolactone and pantoprazole.    From discharge summary: Primary Discharge Diagnoses:  1. Acute on Chronic Biventricular CHF  2. 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 CAD:  4. PAD w/ Claudication  5. Hypokalemia 6. Chronic AF 7. UTI 8. Anemia , Iron Deficient   Hospital Course:  79 y/o male with chronic combined systolic/ diastolic CHF/ biventricular failure 2/2 ischemic CM, CAD s/p prior PCI of the LAD and LCx, remote h/o VF arrest x 2 (first in 2013 in the setting of hypokalemia (K of 2.2) and again in 2014 in setting of MI), no ICD, h/o  permanent atrial fibrillation on chronic a/c w/ apxiaban, CKD (basline SCr ~ 2.0-2.3) and severe LE PAD w/ knownR external iliac artery occlusion with severe infrainguinal arterial occlusive disease and peroneal runoff only. L SFA occlusion with diffuse infrainguinal arterial occlusive disease, being followed by VVS and awaiting surgical revascularization once medically cleared. Of note, he underwent LE arterial angiography on 3/16. Being considered forbilateral femoral endarterectomy and left to right femoral-femoral bypass.  Admitted with A/C Biventricular HF. See below for detailed problems list. He will continue to be followed in the HF clinic. Resuming home health at discharge.    1. Acute on Chronic Biventricular CHF  - known ICM dating back to 2013.  - Echo 1/21showed LVEF 25-30%, global hypokinesis, mild LVH, RV mildly enlarged w/ severely reduced systolic function. Moderate MR. Mild TR. Severe bi atrial enlargement. - Admitted w/ NYHA IIIb symptoms and marked volume overload w/ poor urinary response to high dose IV lasix and worsening renal function, concerning for low output. [place] on 3/25 showed elevated biventricular filling pressures and with equalization of R &L sided diastolic pressuresand preserved CO consistent withrestrictive physiology. Diuresed with lasix drip. Creatinine had slight bump. Diuretics held on the day of discharge and will be restarted on 12/08/2019. - Weight down > 40 pounds.  - He will remain off bb for now.  - May need Palliative care involvement in setting of worsening HF and multiple recent admits   2. AKI on CKD IIIb/IV:  -  suspect cardiorenal/low output - Creatinine baseline 1.7-1.9  - Todays creatinine2.14>2.4  - Plan to check BMET next week at Dr Lonia Skinner office.   3. CAD:  - prior LAD and LCx stenting -No s/s ischemia - no ASA due to apixaban - continue high dose statin - No ?blocker given ow output   4. PAD w/ Claudication  -  knownR external iliac artery occlusion with severe infrainguinal arterial occlusive disease and peroneal runoff only. L SFA occlusion with diffuse infrainguinal arterial occlusive disease, being followed by VVS and awaiting surgical revascularization once medically cleared. - continues with rest pain. Pain improved with tramadol  - Appreciate VVS check-in.  From VVS-->Once he is cleared from a medical standpoint we can try to schedule his surgery. Dr. Trula Slade plans on a left iliac stent, bilateral femoral endarterectomies, and a left to right femorofemoral bypass. This is planned for the week of 4/5-4/9  5. Hypokalemia - K 3.7  6. Chronic AF - Rate controlled.  - continue apixaban.   7. UTI Urine Culture- E COli Start IV rocephin daily x 3 days.   8. Anemia , Iron Deficient  -Hgb 8.4 on his CO-OX . CBC in am.  - Received Feraheme 3/29.  - Resume iron.    Observations/Objective:  NAD.     Assessment and Plan: 1. Acute on chronic combined systolic and diastolic CHF (congestive heart failure) ([place]) Continue current regimen and keep cardiology f/up appt.  Continue daily weights.   - spironolactone (ALDACTONE) 25 MG tablet; Take 1 tablet (25 mg total) by mouth daily.  Dispense: 30 tablet; Refill: 6  2. PVD (peripheral vascular disease) ([place]) Continue f/up with cardiology and vascular surgery.  appts scheduled for both.  3. Microcytic anemia Labs stable 12/26/2019.  Keep GI f/up  4. Gastroesophageal reflux disease, unspecified whether esophagitis present Has upcoming GI appt-keep appt - pantoprazole (PROTONIX) 40 MG tablet; Take 1 tablet (40 mg total) by mouth 2 (two) times daily.  Dispense: 60 tablet; Refill: 2    Follow Up Instructions: Assign PCP in 3-4 weeks   I discussed the assessment and treatment plan with the patient. The patient was provided an opportunity to ask questions and all were answered. The patient agreed with the plan and demonstrated an  understanding of the instructions.   The patient was advised to call back or seek an in-person evaluation if the symptoms worsen or if the condition fails to improve as anticipated.  I provided 14 minutes of non-face-to-face time during this encounter.   Freeman Caldron, PA-C

## 2020-01-01 NOTE — Patient Outreach (Signed)
[facility]) Care Management  [place]  01/01/2020   Logan White 09-11-1940 [id number]   Transition of care  Referral received : 12/09/19 Referral source: Notification of Discharge from inpatient at [facility] on 12/05/19 Diagnosis : Heart Failure  Insurance: [employer]   Subjective:  Successful outreach call to patient wife, Logan White, she reports patient is resting in bed.  She reports that he seems to be doing better, She states his breathing is okay, tolerating mobility around the home, doing shower on his own. She reports that he does have increase in shortness of breath when walking longer distance.  She states patient was sick over the weekend , feeling nauseated and little bit of vomiting after eating. She states over the last few days his is doing better, tolerating soft foods, adding ensure daily.   Per wife patient does not complain of pain in legs, he continues to use walker for support . She discussed recent visit with Vascular MD and no plans for surgery at this time.     Encounter Medications:  Outpatient Encounter Medications as of 01/01/2020  Medication Sig  . acetaminophen (TYLENOL) 325 MG tablet Take 650 mg by mouth every 6 (six) hours as needed for mild pain.   Marland Kitchen albuterol (VENTOLIN HFA) 108 (90 Base) MCG/ACT inhaler Inhale 2 puffs into the lungs every 6 (six) hours as needed for wheezing or shortness of breath.  Marland Kitchen apixaban (ELIQUIS) 5 MG TABS tablet Take 1 tablet (5 mg total) by mouth 2 (two) times daily.  Marland Kitchen atorvastatin (LIPITOR) 80 MG tablet TAKE 1 TABLET BY MOUTH EVERY DAY  . Ensure (ENSURE) Take 237 mLs by mouth 2 (two) times daily as needed (nutritional supplement).  . ferrous sulfate 324 MG TBEC Take 324 mg by mouth in the morning and at bedtime.  . Fluticasone-Salmeterol (ADVAIR DISKUS) 250-50 MCG/DOSE AEPB Inhale 1 puff into the lungs 2 (two) times daily for 14 days. (Patient taking differently: Inhale 1 puff into the  lungs 2 (two) times daily as needed (shortness of breath). )  . hydrALAZINE (APRESOLINE) 10 MG tablet TAKE 1 TABLET BY MOUTH TWICE A DAY  . isosorbide mononitrate (IMDUR) 30 MG 24 hr tablet Take 1 tablet (30 mg total) by mouth daily.  . metoprolol succinate (TOPROL-XL) 25 MG 24 hr tablet Take 0.5 tablets (12.5 mg total) by mouth at bedtime. Take with or immediately following a meal.  . nitroGLYCERIN (NITROSTAT) 0.4 MG SL tablet Place 1 tablet (0.4 mg total) under the tongue every 5 (five) minutes as needed for chest pain (Up to 3 doses).  Marland Kitchen oxymetazoline (AFRIN) 0.05 % nasal spray Place 1 spray into both nostrils 2 (two) times daily as needed for congestion.  . pantoprazole (PROTONIX) 40 MG tablet Take 1 tablet (40 mg total) by mouth 2 (two) times daily.  . potassium chloride SA (KLOR-CON) 20 MEQ tablet Take 1 tablet (20 mEq total) by mouth 2 (two) times daily.  Marland Kitchen spironolactone (ALDACTONE) 25 MG tablet Take 1 tablet (25 mg total) by mouth daily.  Marland Kitchen torsemide (DEMADEX) 100 MG tablet Take 1 tablet (100 mg total) by mouth 2 (two) times daily.   No facility-administered encounter medications on file as of 01/01/2020.    Functional Status:  In your present state of health, do you have any difficulty performing the following activities: 12/10/2019 11/25/2019  Hearing? N N  Vision? N N  Difficulty concentrating or making decisions? N N  Walking or climbing stairs? [name]  Y  Comment using walker -  Dressing or bathing? Y N  Comment wife helps -  Doing errands, shopping? Y N  Comment wife provides transportation Facilities manager and eating ? N -  Comment wife prepares meals -  Using the Toilet? N -  In the past six months, have you accidently leaked urine? N -  Do you have problems with loss of bowel control? N -  Managing your Medications? Y -  Comment wife assist -  Managing your Finances? N -  Housekeeping or managing your Housekeeping? N -  Comment wife manages -  Some recent data might be  hidden    Fall/Depression Screening: Fall Risk  01/01/2020 12/17/2019  Falls in the past year? 0 0  Number falls in past yr: 0 -  Risk for fall due to : Impaired balance/gait Impaired balance/gait  Follow up Falls prevention discussed Falls prevention discussed   PHQ 2/9 Scores 12/17/2019  PHQ - 2 Score 0     Assessment:   Heart failure  No worsening of symptoms, inconsistent with daily weights. Recent weight at home reported as 150 .  Wife assisting with daily medications, verbalize recent medication addition of metoprolol at bedtime. Will benefit from continued education and support of Heart failure management . Patient is also followed by [facility] RN and physical therapy.   GI nausea/vomiting Patient continues with occasional episodes of nausea/vomiting, will discuss with MD at visit .  Improvement with food tolerance over the last few days, patient supplementing diet with ensure  daily.  Atheroscelortic lower extremity disease  No complaining of lower leg pain, consistent follow up with Vascular appointments. Tolerating mobility in home, using rolling walker. Fall preventions measures reviewed.    Appointments Initial visit [place] - PCP on 4/29 Dr.Stark - GI on 5/7.   Plan:  Will plan transition of care call in the next week, will send MD/Heart vascular clinic  initial assessment note at that time .   THN CM Care Plan Problem One     Most Recent Value  Care Plan Problem One  High risk for readmission related to recent hospitalization for Heart failure exaceration   Role Documenting the Problem One  Care Management Coordinator  Care Plan for Problem One  Active  THN Long Term Goal   Patient will experience hospital readmission due Heart failure over the next 31 days   THN Long Term Goal Start Date  12/09/19  Interventions for Problem One Long Term Goal  Discussed current clinical state, heart failure symptoms to notify MD of sooner to avoid readmission  goal. Encouraged to discuss at MD visit complaints of nausea.    THN CM Short Term Goal #1   Patient will attend all medical appointments over the next 30 days   THN CM Short Term Goal #1 Start Date  12/09/19  Interventions for Short Term Goal #1  Reminded of upcoming initial visit with PCP   [facility] CM Short Term Goal #2   Patient will weigh daily and keep a log over the next 30 days   THN CM Short Term Goal #2 Start Date  12/09/19  Interventions for Short Term Goal #2  Reinforced continued monitoring of weights and identifying sudden weight gain sooner , to seek medical attention for. Reviewed best time of day to weight and keep a log.   THN CM Short Term Goal #3  Over the next 30 days patient will be able to identify at  least high salt food to avoid over the next 30 days.   THN CM Short Term Goal #3 Start Date  12/17/19  Interventions for Short Tern Goal #3  Discussed importance of limiting salt in foods, review of high salt foods , will review high low salt handout at next visit once patient recieved, to begin discussion on daily limits of 2000 G and label reading.   THN CM Short Term Goal #4  Over the next 30 days patient will be able to identify 2 symptoms in yellow zone of heart failure to be reported to provider   [facility] CM Short Term Goal #4 Start Date  12/24/19  Interventions for Short Term Goal #4  Reviewed with patient wife worsening heart failure symptoms and action plan      Joylene Draft, RN, BSN  [place] Management Coordinator  [phone]- Mobile [phone]- [place]

## 2020-01-02 ENCOUNTER — Other Ambulatory Visit: Payer: Self-pay

## 2020-01-02 ENCOUNTER — Ambulatory Visit: Payer: Medicare HMO | Attending: Family Medicine | Admitting: Physician Assistant

## 2020-01-02 DIAGNOSIS — I739 Peripheral vascular disease, unspecified: Secondary | ICD-10-CM | POA: Diagnosis not present

## 2020-01-02 DIAGNOSIS — K219 Gastro-esophageal reflux disease without esophagitis: Secondary | ICD-10-CM | POA: Diagnosis not present

## 2020-01-02 DIAGNOSIS — I5043 Acute on chronic combined systolic (congestive) and diastolic (congestive) heart failure: Secondary | ICD-10-CM | POA: Diagnosis not present

## 2020-01-02 DIAGNOSIS — D509 Iron deficiency anemia, unspecified: Secondary | ICD-10-CM

## 2020-01-02 MED ORDER — SPIRONOLACTONE 25 MG PO TABS: 25.0000 mg | ORAL_TABLET | Freq: Every day | ORAL | 6 refills | Status: DC

## 2020-01-02 MED ORDER — PANTOPRAZOLE SODIUM 40 MG PO TBEC: 40.0000 mg | DELAYED_RELEASE_TABLET | Freq: Two times a day (BID) | ORAL | 2 refills | Status: AC

## 2020-01-03 DIAGNOSIS — I5023 Acute on chronic systolic (congestive) heart failure: Secondary | ICD-10-CM | POA: Diagnosis not present

## 2020-01-06 ENCOUNTER — Other Ambulatory Visit: Payer: Self-pay | Admitting: Physician Assistant

## 2020-01-07 ENCOUNTER — Other Ambulatory Visit: Payer: Self-pay | Admitting: *Deleted

## 2020-01-07 ENCOUNTER — Telehealth: Payer: Self-pay | Admitting: Cardiology

## 2020-01-07 DIAGNOSIS — I4819 Other persistent atrial fibrillation: Secondary | ICD-10-CM | POA: Diagnosis not present

## 2020-01-07 DIAGNOSIS — I5082 Biventricular heart failure: Secondary | ICD-10-CM | POA: Diagnosis not present

## 2020-01-07 DIAGNOSIS — E1122 Type 2 diabetes mellitus with diabetic chronic kidney disease: Secondary | ICD-10-CM | POA: Diagnosis not present

## 2020-01-07 DIAGNOSIS — N184 Chronic kidney disease, stage 4 (severe): Secondary | ICD-10-CM | POA: Diagnosis not present

## 2020-01-07 DIAGNOSIS — I5042 Chronic combined systolic (congestive) and diastolic (congestive) heart failure: Secondary | ICD-10-CM | POA: Diagnosis not present

## 2020-01-07 DIAGNOSIS — D631 Anemia in chronic kidney disease: Secondary | ICD-10-CM | POA: Diagnosis not present

## 2020-01-07 DIAGNOSIS — I130 Hypertensive heart and chronic kidney disease with heart failure and stage 1 through stage 4 chronic kidney disease, or unspecified chronic kidney disease: Secondary | ICD-10-CM | POA: Diagnosis not present

## 2020-01-07 DIAGNOSIS — T82858D Stenosis of vascular prosthetic devices, implants and grafts, subsequent encounter: Secondary | ICD-10-CM | POA: Diagnosis not present

## 2020-01-07 DIAGNOSIS — E1151 Type 2 diabetes mellitus with diabetic peripheral angiopathy without gangrene: Secondary | ICD-10-CM | POA: Diagnosis not present

## 2020-01-07 NOTE — Telephone Encounter (Signed)
Received call back from Logan White PT who saw patient today.  He states this was his first visit with patient and patient reports new/increase in pain in his feet the last couple of days.    States he was unaware of him having PAD or vascular issues.   He states patient is in a lot of pain and very uncomfortable, states patient was very emotional due to this.   He would not allow him to see his wounds.  He is concerned and believes patient either needs to be seen again or possibly have something for pain.       Logan White states patient was doing well other than the pain.  No increased swelling or SOB.   His main concern is the pain.   Patient does not have a PCP.   Weight today 158.2 lbs.    OV with CHF weight was 154.      He is requesting I reach out to [facility] Dr. Aleen Campi office to make aware.

## 2020-01-07 NOTE — Patient Outreach (Signed)
[name]) Care Management  Logan White  01/07/2020   Logan White 10-16-1940 [id number]    Transition of care   Referral received : 12/09/19 Referral source: Notification of Discharge from inpatient at [facility] on 12/05/19 Diagnosis : Heart Failure  Insurance: [employer]  Subjective:  Successful outreach call to patient wife she states patient is not feeling as well on today. She reports that he is complaining of his feet hurting ,burning  and feeling cool. Wife states that she has not been able to observe feet for color or temperature. She states patient not complaining of pain in legs as previous just feet. Wife states patient to have physical therapy visit on today, encouraged her to have them observe patient feet at visit if he continues to complain.    She reports patient still having decrease in appetite , she believes that he has eaten something already today. She reports that he is able to get up and around some in the home using walker, She denies patient have increase in shortness of breath or swelling in lower legs.  She reports patient has not weighed on this week, she states he forgets and by the time she gets up he has eaten or dressed for the day.   Encounter Medications:  Outpatient Encounter Medications as of 01/07/2020  Medication Sig  . pantoprazole (PROTONIX) 40 MG tablet Take 1 tablet (40 mg total) by mouth 2 (two) times daily.  Marland Kitchen acetaminophen (TYLENOL) 325 MG tablet Take 650 mg by mouth every 6 (six) hours as needed for mild pain.   Marland Kitchen albuterol (VENTOLIN HFA) 108 (90 Base) MCG/ACT inhaler Inhale 2 puffs into the lungs every 6 (six) hours as needed for wheezing or shortness of breath.  Marland Kitchen apixaban (ELIQUIS) 5 MG TABS tablet Take 1 tablet (5 mg total) by mouth 2 (two) times daily.  Marland Kitchen atorvastatin (LIPITOR) 80 MG tablet TAKE 1 TABLET BY MOUTH EVERY DAY  . Ensure (ENSURE) Take 237 mLs by mouth 2 (two) times daily as needed (nutritional  supplement).  . ferrous sulfate 324 MG TBEC Take 324 mg by mouth in the morning and at bedtime.  . Fluticasone-Salmeterol (ADVAIR DISKUS) 250-50 MCG/DOSE AEPB Inhale 1 puff into the lungs 2 (two) times daily for 14 days. (Patient taking differently: Inhale 1 puff into the lungs 2 (two) times daily as needed (shortness of breath). )  . hydrALAZINE (APRESOLINE) 10 MG tablet TAKE 1 TABLET BY MOUTH TWICE A DAY  . isosorbide mononitrate (IMDUR) 30 MG 24 hr tablet Take 1 tablet (30 mg total) by mouth daily.  . metoprolol succinate (TOPROL-XL) 25 MG 24 hr tablet Take 0.5 tablets (12.5 mg total) by mouth at bedtime. Take with or immediately following a meal.  . nitroGLYCERIN (NITROSTAT) 0.4 MG SL tablet Place 1 tablet (0.4 mg total) under the tongue every 5 (five) minutes as needed for chest pain (Up to 3 doses).  Marland Kitchen oxymetazoline (AFRIN) 0.05 % nasal spray Place 1 spray into both nostrils 2 (two) times daily as needed for congestion.  . pantoprazole (PROTONIX) 40 MG tablet Take 1 tablet (40 mg total) by mouth 2 (two) times daily.  . potassium chloride SA (KLOR-CON) 20 MEQ tablet Take 1 tablet (20 mEq total) by mouth 2 (two) times daily.  Marland Kitchen spironolactone (ALDACTONE) 25 MG tablet Take 1 tablet (25 mg total) by mouth daily.  Marland Kitchen torsemide (DEMADEX) 100 MG tablet Take 1 tablet (100 mg total) by mouth 2 (two) times daily.  No facility-administered encounter medications on file as of 01/07/2020.    Functional Status:  In your present state of health, do you have any difficulty performing the following activities: 12/10/2019 11/25/2019  Hearing? N N  Vision? N N  Difficulty concentrating or making decisions? N N  Walking or climbing stairs? Y Y  Comment using walker -  Dressing or bathing? Y N  Comment wife helps -  Doing errands, shopping? Y N  Comment wife provides transportation Facilities manager and eating ? N -  Comment wife prepares meals -  Using the Toilet? N -  In the past six months, have you  accidently leaked urine? N -  Do you have problems with loss of bowel control? N -  Managing your Medications? Y -  Comment wife assist -  Managing your Finances? N -  Housekeeping or managing your Housekeeping? N -  Comment wife manages -  Some recent data might be hidden    Fall/Depression Screening: Fall Risk  01/01/2020 12/17/2019  Falls in the past year? 0 0  Number falls in past yr: 0 -  Risk for fall due to : Impaired balance/gait Impaired balance/gait  Follow up Falls prevention discussed Falls prevention discussed   PHQ 2/9 Scores 12/17/2019  PHQ - 2 Score 0    Assessment:   Heart failure  No worsening symptoms, not monitoring daily weights.   GI/ Decreased intake Patient continues to have decrease appetite, has GI appointment on this week.   Atherosclerotic lower extremity disease.  Complaint of pain in feet on today, wife has not been able to observe feet today.  Outreach call to [facility] home health , to coordinate with physical therapist prior to visit on today.      Plan:  Will plan return call to patient in the next week, reinforce notifying MD of worsening discomfort , change in color, or temperature, cautioned wife against use of heat, heating pad to feet due to patient poor circulation and risk of burn.  Outreach call to [place] with representative, requested to speak with therapist to visit patient on today, able to provide by contact number for return call. Placed call to Lattie Haw RN  of [facility] regarding patient concern today, she has visit scheduled with patient this week and may reach out to wife for sooner visit.    THN CM Care Plan Problem One     Most Recent Value  Care Plan Problem One  High risk for readmission related to recent hospitalization for Heart failure exaceration   Role Documenting the Problem One  Care Management Coordinator  Care Plan for Problem One  Active  THN Long Term Goal   Patient will experience hospital  readmission due Heart failure over the next 60 days  [goal extended high risk readmission ]  THN Long Term Goal Start Date  12/09/19  Interventions for Problem One Long Term Goal  Reviewed current clinical state, for worsening heart failure symptoms, reinforced daily heart failure management ,taking medications as prescribed, keeping log of weights. Reinforced notifying MD sooner of worsening symptoms to seek medical attention for. Call to [facility] agency to collaborate with therapist prior to visit today to observe feet as patient complains of pain. Cautioned wife against using heat to feet due to decrease circulation problem he has in lower legs,.   THN CM Short Term Goal #1   Patient will attend all medical appointments over the next 30 days   THN CM Short  Term Goal #1 Start Date  12/09/19  Interventions for Short Term Goal #1  Discussed upcoming GI visit on this week, encouraged keeping appointment and discussing patient decrease appetite , nausea occurences.   THN CM Short Term Goal #2   Patient will weigh daily and keep a log over the next 30 days   THN CM Short Term Goal #2 Start Date  01/07/20  Interventions for Short Term Goal #2  Discussed with wife benefit of monitoring daily weights and best time of day to weight, encouraged to write in log if he weights after meal/intake   THN CM Short Term Goal #3  Over the next 30 days patient will be able to identify at least high salt food to avoid over the next 30 days.   THN CM Short Term Goal #3 Start Date  12/17/19  Interventions for Short Tern Goal #3  Encouraged regarding limiting salt in diet   THN CM Short Term Goal #4  Over the next 30 days patient will be able to identify 2 symptoms in yellow zone of heart failure to be reported to provider   [facility] CM Short Term Goal #4 Start Date  01/07/20  Interventions for Short Term Goal #4  Reinforced worsening symptoms to notify MD of       Joylene Draft, RN, BSN  [place] Management  Coordinator  [phone]- Mobile [phone]- [place]

## 2020-01-07 NOTE — Telephone Encounter (Signed)
Saralyn Pilar is seeing patient for the first time today, patient has new onset of pain on his feet and nothing for relief.  From what he understands patient was getting tramadol in the hospital but came home with nothing.  If patient is having this much pain in his feet it's going to impede with PT.   Saralyn Pilar also report patient has gained weight: 4/13 143lbs 4/23 154lbs 5/4   158.2lbs  Saralyn Pilar will be back at patient's home to do more PT with him.

## 2020-01-07 NOTE — Telephone Encounter (Signed)
Left message to call back  Per chart review: Patient followed by AHF clinic and [facility]   OV with [facility] 4/26-non healing wounds, being evaluated for fem-fem bypass, follow up in 3 weeks Weight at OV 154 lbs  OV with CHF 4/22-chronic biv CHF.CKD, CAD F/u in 3-4 weeks Weight at OV 154 lbs   Routed to CHF clinic as well.

## 2020-01-08 ENCOUNTER — Other Ambulatory Visit: Payer: Self-pay | Admitting: *Deleted

## 2020-01-08 NOTE — Patient Outreach (Signed)
[place] [facility]) Care Management  01/08/2020  Logan White 1940-12-15 [id number]   Telephone follow up assessment .   Subjective: Successful outreach call to patient wife , Logan White.  She reports patient feeling better today, he denies pain in his feet or legs on today. She reports that he is able to tolerate walking in home, and is currently sitting on balcony. Wife discussed concern with patient not having medication ordered for pain, she reports that he was taking Tramadol while in the hospital but does not have prescribed medication for home.  Noted patient next visit with Dr. Trula Slade on 5/17.     Plan Will plan call to Dr. Trula Slade office regarding patient, with compliant of episodes of pain in feet/legs,no prescribed prn medication.  Joylene Draft, RN, BSN  [place] Management Coordinator  [phone]- Mobile [phone]- Toll Free Main Office

## 2020-01-09 ENCOUNTER — Other Ambulatory Visit: Payer: Self-pay | Admitting: *Deleted

## 2020-01-09 ENCOUNTER — Telehealth: Payer: Self-pay

## 2020-01-09 DIAGNOSIS — E1122 Type 2 diabetes mellitus with diabetic chronic kidney disease: Secondary | ICD-10-CM | POA: Diagnosis not present

## 2020-01-09 DIAGNOSIS — D631 Anemia in chronic kidney disease: Secondary | ICD-10-CM | POA: Diagnosis not present

## 2020-01-09 DIAGNOSIS — I5042 Chronic combined systolic (congestive) and diastolic (congestive) heart failure: Secondary | ICD-10-CM | POA: Diagnosis not present

## 2020-01-09 DIAGNOSIS — T82858D Stenosis of vascular prosthetic devices, implants and grafts, subsequent encounter: Secondary | ICD-10-CM | POA: Diagnosis not present

## 2020-01-09 DIAGNOSIS — I4819 Other persistent atrial fibrillation: Secondary | ICD-10-CM | POA: Diagnosis not present

## 2020-01-09 DIAGNOSIS — I130 Hypertensive heart and chronic kidney disease with heart failure and stage 1 through stage 4 chronic kidney disease, or unspecified chronic kidney disease: Secondary | ICD-10-CM | POA: Diagnosis not present

## 2020-01-09 DIAGNOSIS — N184 Chronic kidney disease, stage 4 (severe): Secondary | ICD-10-CM | POA: Diagnosis not present

## 2020-01-09 DIAGNOSIS — I5082 Biventricular heart failure: Secondary | ICD-10-CM | POA: Diagnosis not present

## 2020-01-09 DIAGNOSIS — E1151 Type 2 diabetes mellitus with diabetic peripheral angiopathy without gangrene: Secondary | ICD-10-CM | POA: Diagnosis not present

## 2020-01-09 NOTE — Telephone Encounter (Signed)
[name] with [facility] called and said that pt has been complaining of pain in his foot. She said that he had Tramadol when he was in the hospital and wanted to know if he could get a script for this sent in for PRN use. He has an upcoming appt and just wanted to get enough to last until then when they decide what the plan of action will be   Advised her that Dr Trula Slade is out of the office and I will speak with him tomorrow and call her back   [name] [phone]   [place], [place]

## 2020-01-09 NOTE — Patient Outreach (Signed)
[place] [facility]) Care Management  01/09/2020  Logan White 01-22-1941 [id number]    Care Coordination    Subjective: Outreach call to Dr.Brabham office to follow up regarding patient complaint of pain, lower extremity at his feet this week. I was able to leave a message on nurse line notifying MD of patient pain and no prescribed medication. Explained in message that per wife patient has taking Tramadol during hospital stay and no prescribed medication at discharge.    1630 Incoming call from [place] at [facility] office regarding above message,informed her patient discomfort was improved when I spoke with wife on yesterday,  she plans to follow up Dr. Arita Miss on tomorrow regarding above.   Plan Will place call to wife to update  by business day.    Joylene Draft, RN, BSN  [name] Management Coordinator  [phone]- Mobile [phone]- Toll Free Main Office

## 2020-01-10 ENCOUNTER — Other Ambulatory Visit: Payer: Self-pay | Admitting: *Deleted

## 2020-01-10 ENCOUNTER — Other Ambulatory Visit: Payer: Self-pay | Admitting: Cardiology

## 2020-01-10 ENCOUNTER — Ambulatory Visit: Payer: Medicare HMO | Admitting: Gastroenterology

## 2020-01-10 DIAGNOSIS — E1151 Type 2 diabetes mellitus with diabetic peripheral angiopathy without gangrene: Secondary | ICD-10-CM | POA: Diagnosis not present

## 2020-01-10 DIAGNOSIS — T82858D Stenosis of vascular prosthetic devices, implants and grafts, subsequent encounter: Secondary | ICD-10-CM | POA: Diagnosis not present

## 2020-01-10 DIAGNOSIS — E1122 Type 2 diabetes mellitus with diabetic chronic kidney disease: Secondary | ICD-10-CM | POA: Diagnosis not present

## 2020-01-10 DIAGNOSIS — D631 Anemia in chronic kidney disease: Secondary | ICD-10-CM | POA: Diagnosis not present

## 2020-01-10 DIAGNOSIS — N184 Chronic kidney disease, stage 4 (severe): Secondary | ICD-10-CM | POA: Diagnosis not present

## 2020-01-10 DIAGNOSIS — I4819 Other persistent atrial fibrillation: Secondary | ICD-10-CM | POA: Diagnosis not present

## 2020-01-10 DIAGNOSIS — I5042 Chronic combined systolic (congestive) and diastolic (congestive) heart failure: Secondary | ICD-10-CM | POA: Diagnosis not present

## 2020-01-10 DIAGNOSIS — I5082 Biventricular heart failure: Secondary | ICD-10-CM | POA: Diagnosis not present

## 2020-01-10 DIAGNOSIS — I130 Hypertensive heart and chronic kidney disease with heart failure and stage 1 through stage 4 chronic kidney disease, or unspecified chronic kidney disease: Secondary | ICD-10-CM | POA: Diagnosis not present

## 2020-01-10 NOTE — Telephone Encounter (Signed)
*  STAT* If patient is at the pharmacy, call can be transferred to refill team.   1. Which medications need to be refilled? (please list name of each medication and dose if known)  apixaban (ELIQUIS) 5 MG TABS tablet  2. Which pharmacy/location (including street and city if local pharmacy) is medication to be sent to? [facility] [place] [facility] [place]  3. Do they need a 30 day or 90 day supply? 90 with refills  Patient is out of medication

## 2020-01-10 NOTE — Patient Outreach (Signed)
[place] [facility]) Care Management  01/10/2020  GABRIEN MENTINK May 17, 1941 [id number]   Care Coordination   Subjective: Successful outreach call to patient wife , Ash Mcelwain, to update her on call to Dr.Brabham office regarding patient complaint of pain in feet this week. Explained to her call to MD office and office to follow up after speaking with Dr.Brabham on today. Discussed with wife that they may receive call from Dr.Brabham office.  Wife reports patient has not complained of pain to the degree he had earlier this week, he continues to tolerate mobility within the home. Reminded wife patient has tylenol that can be used to manage pain.   She reports patient having visit from Remote RN on yesterday as well as physical therapy.    70 Incoming call from patient wife, stating patient will be out of Eliquis after today's dose,she states that she just realized it . [facility] and that states patient needs a refill order on medication.  I placed call to Dr.Crenshaw office to notify of patient need for refill on Eliquis as wife has spoken with pharmacy on today. Danise Mina states she will get request over to refill team with high priority. She also suggested sometimes pharmacy will release a emergency supply until refill order.  [facility], spoke with pharmacy representative he verifies that he has sent refill request to [facility] office. Asked regarding filling temporary amount, he states that they can supply doses enough to get through the weekend if needed. I explained that I have called Dr. [name] office for refill.  Placed return call to patient wife, informed of call to Dr. [name] office for refill and [facility], to explain possible to get emergency release to supply enough Eliquis to get through the weekend, if needed.    Plan Will plan follow up call to wife on next business day.    Joylene Draft, RN, BSN   [place] Management Coordinator  [phone]- Mobile [phone]- Toll Free Main Office

## 2020-01-13 ENCOUNTER — Other Ambulatory Visit (HOSPITAL_COMMUNITY): Payer: Self-pay | Admitting: Adult Health

## 2020-01-13 ENCOUNTER — Other Ambulatory Visit: Payer: Self-pay | Admitting: *Deleted

## 2020-01-13 NOTE — Patient Outreach (Signed)
[place] [facility]) Care Management  [place]  01/13/2020   Logan White 1941-04-18 [id number]   Referral received : 12/09/19 Referral source: Notification of Discharge from inpatient at [facility] on 12/05/19 Diagnosis : Heart Failure  Insurance: [employer]  Subjective:  Outreach call to patient wife, no answer able to leave a HIPAA complaint voice mail message for return call. Patient wife returned call, she states patient had a "so so' weekend . She reports that he continues to tolerate short distance mobility in home using can. She states he complained of pain in feet/legs over the weekend she has encouraged him to use tylenol as needed. Wife discussed feeling bad on last week after 2nd covid vaccine but a little better over the weekend. She report patient is resting in the bed now, but he as been up this morning in the kitchen without complaints, then back to bed resting.    She discussed patient appetite continues to be low, eating small meals , denies further vomiting episodes. She states that he is drinking one ensure at least daily sometimes 2 depending on what he eats.   She denies noting patient with increase in swelling , shortness of breath, she is unsure if he has weighed on today.   Wife reports patient able to get emergency release of enough Eliquis from pharmacy on Friday to last until today , when refill order from cardiology completed.   Encounter Medications:  Outpatient Encounter Medications as of 01/13/2020  Medication Sig  . KLOR-CON M20 20 MEQ tablet TAKE 1 TABLET BY MOUTH TWICE A DAY  . pantoprazole (PROTONIX) 40 MG tablet Take 1 tablet (40 mg total) by mouth 2 (two) times daily.  Marland Kitchen acetaminophen (TYLENOL) 325 MG tablet Take 650 mg by mouth every 6 (six) hours as needed for mild pain.   Marland Kitchen albuterol (VENTOLIN HFA) 108 (90 Base) MCG/ACT inhaler Inhale 2 puffs into the lungs every 6 (six) hours as needed for wheezing or shortness of  breath.  Marland Kitchen apixaban (ELIQUIS) 5 MG TABS tablet Take 1 tablet (5 mg total) by mouth 2 (two) times daily.  Marland Kitchen atorvastatin (LIPITOR) 80 MG tablet TAKE 1 TABLET BY MOUTH EVERY DAY  . Ensure (ENSURE) Take 237 mLs by mouth 2 (two) times daily as needed (nutritional supplement).  . ferrous sulfate 324 MG TBEC Take 324 mg by mouth in the morning and at bedtime.  . Fluticasone-Salmeterol (ADVAIR DISKUS) 250-50 MCG/DOSE AEPB Inhale 1 puff into the lungs 2 (two) times daily for 14 days. (Patient taking differently: Inhale 1 puff into the lungs 2 (two) times daily as needed (shortness of breath). )  . hydrALAZINE (APRESOLINE) 10 MG tablet TAKE 1 TABLET BY MOUTH TWICE A DAY  . isosorbide mononitrate (IMDUR) 30 MG 24 hr tablet Take 1 tablet (30 mg total) by mouth daily.  . metoprolol succinate (TOPROL-XL) 25 MG 24 hr tablet Take 0.5 tablets (12.5 mg total) by mouth at bedtime. Take with or immediately following a meal.  . nitroGLYCERIN (NITROSTAT) 0.4 MG SL tablet Place 1 tablet (0.4 mg total) under the tongue every 5 (five) minutes as needed for chest pain (Up to 3 doses).  Marland Kitchen oxymetazoline (AFRIN) 0.05 % nasal spray Place 1 spray into both nostrils 2 (two) times daily as needed for congestion.  . pantoprazole (PROTONIX) 40 MG tablet Take 1 tablet (40 mg total) by mouth 2 (two) times daily.  Marland Kitchen spironolactone (ALDACTONE) 25 MG tablet Take 1 tablet (25 mg total) by  mouth daily.  Marland Kitchen torsemide (DEMADEX) 100 MG tablet Take 1 tablet (100 mg total) by mouth 2 (two) times daily.   No facility-administered encounter medications on file as of 01/13/2020.    Functional Status:  In your present state of health, do you have any difficulty performing the following activities: 12/10/2019 11/25/2019  Hearing? N N  Vision? N N  Difficulty concentrating or making decisions? N N  Walking or climbing stairs? Y Y  Comment using walker -  Dressing or bathing? Y N  Comment wife helps -  Doing errands, shopping? Y N  Comment wife  provides transportation Facilities manager and eating ? N -  Comment wife prepares meals -  Using the Toilet? N -  In the past six months, have you accidently leaked urine? N -  Do you have problems with loss of bowel control? N -  Managing your Medications? Y -  Comment wife assist -  Managing your Finances? N -  Housekeeping or managing your Housekeeping? N -  Comment wife manages -  Some recent data might be hidden    Fall/Depression Screening: Fall Risk  01/01/2020 12/17/2019  Falls in the past year? 0 0  Number falls in past yr: 0 -  Risk for fall due to : Impaired balance/gait Impaired balance/gait  Follow up Falls prevention discussed Falls prevention discussed   PHQ 2/9 Scores 12/17/2019  PHQ - 2 Score 0    Assessment:   Heart Failure No worsening symptoms, continued reinforcement  education on Heart failure symptoms, action plan and daily self care  heart failure management   Atherosclerotic lower extremity disease Patient with intermittent pain to legs/feet,some relief when taking tylenol. Noted  participation with home health PT. Patient with wound to left  foot area.   Nutrition/Intake Appetite decreased , supplementing diet with ensure, denies having recent episode of vomiting .  They have rescheduled GI MD visit .    Plan:  Placed call to [facility] home health to speak with home health RN visiting patient for collaboration regarding patient progress  concerns/complaints, able to leave a message for return call. Placed call Remote health spoke with Lattie Haw regarding patient progress and concerns. Discussed wound on foot , she addressed at visit on last week  with wound care instructions to wife. Discussed patient report of pain uncertain if related to wound or general vascular condition. Concern for patient motivation. She plans to follow up with patient/wife on this week.  .  Will plan follow up call to patient in the next week or sooner, wife encouraged to make  contact sooner for new concerns.    [facility] CM Care Plan Problem One     Most Recent Value  Care Plan Problem One  High risk for readmission related to recent hospitalization for Heart failure exaceration   Role Documenting the Problem One  Care Management Coordinator  Care Plan for Problem One  Active  [facility]N Long Term Goal   Patient will experience hospital readmission due Heart failure over the next 60 days   [facility]N Long Term Goal Start Date  12/09/19  Interventions for Problem One Long Term Goal  Reinforced daily management of heart failure , taking medications as prescribed, updated on refill order for Eliiquis, tracking daily weights, notifying MD of sooner new concern to address sooner to avoid readmission. Collaboration calls to [facility] and Remote health .   [facility] CM Short Term Goal #1   Patient will attend all medical appointments over  the next 30 days   [facility] CM Short Term Goal #1 Start Date  12/09/19  [facility] CM Short Term Goal #1 Met Date  01/13/20  THN CM Short Term Goal #2   Patient will weigh daily and keep a log over the next 30 days   [facility]N CM Short Term Goal #2 Start Date  01/07/20  Interventions for Short Term Goal #2  Stressed importance of tracking weighs daily, best time of day to weigh to identify sudden weight gain sooner. Verifed having THN calandar to log weights.   [facility]N CM Short Term Goal #3  Over the next 30 days patient will be able to identify at least 3  high salt food to avoid over the next 30 days.   [facility]N CM Short Term Goal #3 Start Date  12/17/19  Interventions for Short Tern Goal #3  Review low high salt foods, to limit with teachback, verified recieving high low salt handout , stressed importance of limiting salt and how this helps with worsening heart failure .   [facility]N CM Short Term Goal #4  Over the next 30 days patient will be able to identify 2 symptoms in yellow zone of heart failure to be reported to provider   [facility] CM Short Term Goal #4 Start Date  01/07/20  Interventions for Short  Term Goal #4  Reviewd with teachback  worsening heart failure symptoms and action plan     [facility] CM Care Plan Problem Two     Most Recent Value  Care Plan Problem Two  Altered pain control / healing of lower extremities related to Atherosclerotic disease of lower extremity   Care Plan for Problem Two  Active  Interventions for Problem Two Long Term Goal   Reviewed taking medications as prescribed, reviewed using tylenol as needed for pain and notifying MD of worsening pain not relieved, inability to tolerate usual mobility in home. Stressed Careers information officer medical appointment with vascular MD.  [facility] Long Term Goal  Over the next 30 days patient will be able to report improved pain control of lower extremities as evidenced by report.   [facility] Long Term Goal Start Date  01/13/20        Joylene Draft, RN, BSN  [place] Management Coordinator  [phone]- Mobile [phone]- Toll Free Main Office

## 2020-01-14 NOTE — Telephone Encounter (Signed)
The medication was ordered in a more previous encounter unable to refill

## 2020-01-15 NOTE — Telephone Encounter (Signed)
Patient has follow up appt 01/20/20 with Dr Trula Slade.

## 2020-01-17 ENCOUNTER — Telehealth (HOSPITAL_COMMUNITY): Payer: Self-pay

## 2020-01-17 DIAGNOSIS — N184 Chronic kidney disease, stage 4 (severe): Secondary | ICD-10-CM | POA: Diagnosis not present

## 2020-01-17 DIAGNOSIS — E1122 Type 2 diabetes mellitus with diabetic chronic kidney disease: Secondary | ICD-10-CM | POA: Diagnosis not present

## 2020-01-17 DIAGNOSIS — E1151 Type 2 diabetes mellitus with diabetic peripheral angiopathy without gangrene: Secondary | ICD-10-CM | POA: Diagnosis not present

## 2020-01-17 DIAGNOSIS — I4819 Other persistent atrial fibrillation: Secondary | ICD-10-CM | POA: Diagnosis not present

## 2020-01-17 DIAGNOSIS — I5042 Chronic combined systolic (congestive) and diastolic (congestive) heart failure: Secondary | ICD-10-CM | POA: Diagnosis not present

## 2020-01-17 DIAGNOSIS — T82858D Stenosis of vascular prosthetic devices, implants and grafts, subsequent encounter: Secondary | ICD-10-CM | POA: Diagnosis not present

## 2020-01-17 DIAGNOSIS — D631 Anemia in chronic kidney disease: Secondary | ICD-10-CM | POA: Diagnosis not present

## 2020-01-17 DIAGNOSIS — I5082 Biventricular heart failure: Secondary | ICD-10-CM | POA: Diagnosis not present

## 2020-01-17 DIAGNOSIS — I130 Hypertensive heart and chronic kidney disease with heart failure and stage 1 through stage 4 chronic kidney disease, or unspecified chronic kidney disease: Secondary | ICD-10-CM | POA: Diagnosis not present

## 2020-01-17 NOTE — Telephone Encounter (Signed)

## 2020-01-20 ENCOUNTER — Telehealth: Payer: Self-pay | Admitting: Cardiology

## 2020-01-20 ENCOUNTER — Other Ambulatory Visit: Payer: Self-pay

## 2020-01-20 ENCOUNTER — Ambulatory Visit (INDEPENDENT_AMBULATORY_CARE_PROVIDER_SITE_OTHER): Payer: Medicare HMO | Admitting: Surgery

## 2020-01-20 ENCOUNTER — Encounter: Payer: Self-pay | Admitting: Surgery

## 2020-01-20 VITALS — BP 103/66 | HR 79 | Temp 98.3°F | Resp 20 | Ht 75.5 in | Wt 154.0 lb

## 2020-01-20 DIAGNOSIS — I7025 Atherosclerosis of native arteries of other extremities with ulceration: Secondary | ICD-10-CM | POA: Diagnosis not present

## 2020-01-20 NOTE — Telephone Encounter (Signed)
Hinton Dyer from [facility] is calling to report she treated the patient's left foot diabetic ulcer with aquacel and dry dressing. She then wrapped with coban to secure it in place and states she has also placed a consult request to Dr. Melony Overly the podiatrist.

## 2020-01-20 NOTE — Progress Notes (Signed)
Vascular and Vein Specialist of [place]  Patient name: Logan White MRN: [id number] DOB: [birth year] Sex: male   REASON FOR VISIT:    Follow up  HISOTRY OF PRESENT ILLNESS:   Logan White a 79 y.o.malewho I met on 11/15/2019 when the patient was having complaints of right leg pain. He has a known right external iliac occlusion with severe infrainguinal occlusive disease on the right. This was diagnosed by angiography from Dr. Alvester Chou in the past. I repeated his arteriogram during his stay which confirmed the above findings. I was planning on a left to right femoral-femoral bypass graft and left iliac stenting to get him out of rest pain. He was in the hospital for heart failure issues and was deemed a prohibitive operative risk. He continued to get diuresed and was ultimately discharged from the hospital. He is on anticoagulation for his atrial fibrillation which also helps his wrist pain. Currently he states that he is not having any leg pain. He is not doing hardly any walking because of his shortness of breath and generalized weakness.  The patient does have a small open wound on the inside of his right great toe. He now has a new ulcer on his left second toe he has chronic renal insufficiency. His creatinine has been stable, around 3. He suffers from diabetes and hyperlipidemia. He is currently on home oxygen.  PAST MEDICAL HISTORY:   Past Medical History:  Diagnosis Date  . AAA (abdominal aortic aneurysm) ([place])    a. 08/2015 Abd U/S: 2.7 x 2.9 x 3.2 cm infrarenal AAA.  . Bradycardia    a. Requiring discontinuation of use of BB/CCB  . Cardiac arrest - ventricular fibrillation    a. 03/2012 in setting of hypokalemia (prolonged hosp with VDRF, tracheobronchitis, ARF, shock liver, PAF, AMS felt secondary to post-anoxic encephalopathy/shock).  . Carotid artery stenosis    a. 01/2011 - 40-59% bilateral stenosis;  b. 06/2015  Carotid U/S: 40-59% bilat ICA stenosis->f/u 6 mos.  . CKD (chronic kidney disease), stage III   . Coronary artery disease    a. s/p aborted ant STEMI tx with Cypher DES to LAD 10/04 (residual at cath: D1 50%, CFX 40% and multiple dist 70%, EF 55%);   b. myoview 3/10: Ef 47%, infero-apical isch, LOW RISK - med Tx recommended;  c. 12/2012 VF Arrest/Cath: LAD 40isr, 80 apical, LCX 100 (failed PTCA), RCA nondom.  . Diabetes mellitus   . Diverticulosis 2001  . Elevated LFTs    Shock liver 03/2012  . H/O: CVA (cardiovascular accident)   . Hematemesis    a. 12/2010 felt 2/2 Mallory Weiss tear - pt could not afford colonscopy/EGD at that time so Coumadin was deferred. Coumadin initiated 03/2012 without any evidence for bleeding.  Marland Kitchen History of Ischemic Cardiomyopathy    a. EF 50-55% by echo 03/09/12 (was 30% by echo 02/24/12); b. 03/2013 Echo: EF 55-60%, mild MR, sev dil LA, mod dil RA, PASP 71mHg.  .Marland KitchenHypertensive heart disease    a. echo 4/12: EF 50%, asymmetric septal hypertrophy, no SAM or LVOT gradient, LAE, PASP 35  . Inguinal hernia   . 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 Mitral regurgitation    a. Mild by echo 03/2012 and 03/2013.  . Mixed Hyperlipidemia   . Peripheral vascular disease ([place]    a. h/o LE angioplasty;  b. 08/2015 Duplex: >50% RCIA, 100% REIA, >50% LEIA, elev vel in SMA, patent IVC.  .Marland KitchenPersistent atrial fibrillation (  [place])    a. Dx 12/2010-->coumadin (CHA2DS2VASc = 7).  . QT prolongation      FAMILY HISTORY:   Family History  Problem Relation Age of Onset  . Heart disease Father        ALSO UNCLE DIED HAD CAD  . Heart failure Father   . Brain cancer Mother   . Leukemia Mother   . Sickle cell anemia Mother   . Sickle cell anemia Other     SOCIAL HISTORY:   Social History   Tobacco Use  . Smoking status: Former Smoker    Packs/day: 0.50    Years: 30.00    Pack years: 15.00    Types: Cigarettes    Quit date: 03/12/2012    Years  since quitting: 7.8  . Smokeless tobacco: Never Used  Substance Use Topics  . Alcohol use: No    Alcohol/week: 0.0 standard drinks     ALLERGIES:   No Known Allergies   CURRENT MEDICATIONS:   Current Outpatient Medications  Medication Sig Dispense Refill  . acetaminophen (TYLENOL) 325 MG tablet Take 650 mg by mouth every 6 (six) hours as needed for mild pain.     Marland Kitchen albuterol (VENTOLIN HFA) 108 (90 Base) MCG/ACT inhaler Inhale 2 puffs into the lungs every 6 (six) hours as needed for wheezing or shortness of breath. 90 g 3  . atorvastatin (LIPITOR) 80 MG tablet TAKE 1 TABLET BY MOUTH EVERY DAY 90 tablet 3  . ELIQUIS 5 MG TABS tablet TAKE 1 TABLET BY MOUTH TWICE A DAY 60 tablet 5  . Ensure (ENSURE) Take 237 mLs by mouth 2 (two) times daily as needed (nutritional supplement).    . ferrous sulfate 324 MG TBEC Take 324 mg by mouth in the morning and at bedtime.    . gabapentin (NEURONTIN) 300 MG capsule     . hydrALAZINE (APRESOLINE) 10 MG tablet TAKE 1 TABLET BY MOUTH TWICE A DAY 180 tablet 3  . hydrOXYzine (VISTARIL) 25 MG capsule     . isosorbide mononitrate (IMDUR) 30 MG 24 hr tablet Take 1 tablet (30 mg total) by mouth daily. 30 tablet 1  . KLOR-CON M20 20 MEQ tablet TAKE 1 TABLET BY MOUTH TWICE A DAY 60 tablet 0  . metoprolol succinate (TOPROL-XL) 25 MG 24 hr tablet Take 0.5 tablets (12.5 mg total) by mouth at bedtime. Take with or immediately following a meal. 45 tablet 3  . nitroGLYCERIN (NITROSTAT) 0.4 MG SL tablet Place 1 tablet (0.4 mg total) under the tongue every 5 (five) minutes as needed for chest pain (Up to 3 doses). 25 tablet 4  . oxymetazoline (AFRIN) 0.05 % nasal spray Place 1 spray into both nostrils 2 (two) times daily as needed for congestion.    . pantoprazole (PROTONIX) 40 MG tablet Take 1 tablet (40 mg total) by mouth 2 (two) times daily. 60 tablet 2  . spironolactone (ALDACTONE) 25 MG tablet Take 1 tablet (25 mg total) by mouth daily. 30 tablet 6  . torsemide  (DEMADEX) 100 MG tablet Take 1 tablet (100 mg total) by mouth 2 (two) times daily. 120 tablet 2  . Fluticasone-Salmeterol (ADVAIR DISKUS) 250-50 MCG/DOSE AEPB Inhale 1 puff into the lungs 2 (two) times daily for 14 days. (Patient taking differently: Inhale 1 puff into the lungs 2 (two) times daily as needed (shortness of breath). ) 28 each 0   No current facility-administered medications for this visit.    REVIEW OF SYSTEMS:   '[X]'  denotes positive  finding, '[ ]'  denotes negative finding Cardiac  Comments:  Chest pain or chest pressure:    Shortness of breath upon exertion:    Short of breath when lying flat:    Irregular heart rhythm:        Vascular    Pain in calf, thigh, or hip brought on by ambulation:    Pain in feet at night that wakes you up from your sleep:     Blood clot in your veins:    Leg swelling:         Pulmonary    Oxygen at home:    Productive cough:     Wheezing:         Neurologic    Sudden weakness in arms or legs:     Sudden numbness in arms or legs:     Sudden onset of difficulty speaking or slurred speech:    Temporary loss of vision in one eye:     Problems with dizziness:         Gastrointestinal    Blood in stool:     Vomited blood:         Genitourinary    Burning when urinating:     Blood in urine:        Psychiatric    Major depression:         Hematologic    Bleeding problems:    Problems with blood clotting too easily:        Skin    Rashes or ulcers:        Constitutional    Fever or chills:      PHYSICAL EXAM:   Vitals:   01/20/20 1100  BP: 103/66  Pulse: 79  Resp: 20  Temp: 98.3 F (36.8 C)  SpO2: 95%  Weight: 154 lb (69.9 kg)  Height: 6' 3.5" (1.918 m)    GENERAL: The patient is a well-nourished male, in no acute distress. The vital signs are documented above. CARDIAC: There is a regular rate and rhythm.  VASCULAR: Nonpalpable pedal pulses PULMONARY: Non-labored respirations ABDOMEN: Soft and non-tender with  normal pitched bowel sounds.  MUSCULOSKELETAL: There are no major deformities or cyanosis. NEUROLOGIC: No focal weakness or paresthesias are detected. SKIN: There are no ulcers or rashes noted. PSYCHIATRIC: The patient has a normal affect.  STUDIES:   None  MEDICAL ISSUES:   Since our last visit, the patient has developed a new wound on his left second toe.  The wound on his right great toe is essentially unchanged.  This presents a very difficult management situation given the patient's underlying cardiac disease.  Without improving his blood flow, he will end up with amputations likely on both legs.  He needs a inflow and a outflow procedure.  I think that is too much to put him through in 1 operation.  Therefore I have proposed beginning with the inflow.  I plan on doing a left to right femoral-femoral bypass graft with stenting of his left external iliac artery.  He will require extensive endarterectomy in the right groin  I am scheduling this for Friday, May 21.  I am going to get him to see Dr. Haroldine Laws this week to make sure he is medically optimized.  He will need to be off of his Eliquis.    Leia Alf, MD, FACS [facility] and Vein Specialists of [facility] [phone] Pager [phone]

## 2020-01-20 NOTE — Telephone Encounter (Signed)
Message sent to Dr.Crenshaw to make him aware.

## 2020-01-21 ENCOUNTER — Telehealth (HOSPITAL_COMMUNITY): Payer: Self-pay | Admitting: *Deleted

## 2020-01-21 ENCOUNTER — Other Ambulatory Visit (HOSPITAL_COMMUNITY)
Admission: RE | Admit: 2020-01-21 | Discharge: 2020-01-21 | Disposition: A | Discharge status: Home / Self Care | Payer: Medicare HMO | Source: Ambulatory Visit | Attending: Surgery | Admitting: Surgery

## 2020-01-21 DIAGNOSIS — E876 Hypokalemia: Secondary | ICD-10-CM | POA: Diagnosis not present

## 2020-01-21 DIAGNOSIS — I745 Embolism and thrombosis of iliac artery: Secondary | ICD-10-CM | POA: Diagnosis not present

## 2020-01-21 DIAGNOSIS — Z01812 Encounter for preprocedural laboratory examination: Secondary | ICD-10-CM | POA: Insufficient documentation

## 2020-01-21 DIAGNOSIS — I130 Hypertensive heart and chronic kidney disease with heart failure and stage 1 through stage 4 chronic kidney disease, or unspecified chronic kidney disease: Secondary | ICD-10-CM | POA: Diagnosis not present

## 2020-01-21 DIAGNOSIS — E1122 Type 2 diabetes mellitus with diabetic chronic kidney disease: Secondary | ICD-10-CM | POA: Diagnosis present

## 2020-01-21 DIAGNOSIS — I771 Stricture of artery: Secondary | ICD-10-CM | POA: Diagnosis not present

## 2020-01-21 DIAGNOSIS — I2510 Atherosclerotic heart disease of native coronary artery without angina pectoris: Secondary | ICD-10-CM | POA: Diagnosis not present

## 2020-01-21 DIAGNOSIS — Z20822 Contact with and (suspected) exposure to covid-19: Secondary | ICD-10-CM | POA: Diagnosis present

## 2020-01-21 DIAGNOSIS — Z9981 Dependence on supplemental oxygen: Secondary | ICD-10-CM | POA: Diagnosis not present

## 2020-01-21 DIAGNOSIS — I5082 Biventricular heart failure: Secondary | ICD-10-CM | POA: Diagnosis not present

## 2020-01-21 DIAGNOSIS — E1151 Type 2 diabetes mellitus with diabetic peripheral angiopathy without gangrene: Secondary | ICD-10-CM | POA: Diagnosis present

## 2020-01-21 DIAGNOSIS — I509 Heart failure, unspecified: Secondary | ICD-10-CM | POA: Diagnosis not present

## 2020-01-21 DIAGNOSIS — D631 Anemia in chronic kidney disease: Secondary | ICD-10-CM | POA: Diagnosis present

## 2020-01-21 DIAGNOSIS — I5021 Acute systolic (congestive) heart failure: Secondary | ICD-10-CM | POA: Diagnosis not present

## 2020-01-21 DIAGNOSIS — E871 Hypo-osmolality and hyponatremia: Secondary | ICD-10-CM | POA: Diagnosis not present

## 2020-01-21 DIAGNOSIS — I714 Abdominal aortic aneurysm, without rupture: Secondary | ICD-10-CM | POA: Diagnosis present

## 2020-01-21 DIAGNOSIS — Z955 Presence of coronary angioplasty implant and graft: Secondary | ICD-10-CM | POA: Diagnosis not present

## 2020-01-21 DIAGNOSIS — Z452 Encounter for adjustment and management of vascular access device: Secondary | ICD-10-CM | POA: Diagnosis not present

## 2020-01-21 DIAGNOSIS — I119 Hypertensive heart disease without heart failure: Secondary | ICD-10-CM | POA: Diagnosis not present

## 2020-01-21 DIAGNOSIS — I5043 Acute on chronic combined systolic (congestive) and diastolic (congestive) heart failure: Secondary | ICD-10-CM | POA: Diagnosis present

## 2020-01-21 DIAGNOSIS — J441 Chronic obstructive pulmonary disease with (acute) exacerbation: Secondary | ICD-10-CM | POA: Diagnosis not present

## 2020-01-21 DIAGNOSIS — I255 Ischemic cardiomyopathy: Secondary | ICD-10-CM | POA: Diagnosis present

## 2020-01-21 DIAGNOSIS — I5023 Acute on chronic systolic (congestive) heart failure: Secondary | ICD-10-CM | POA: Diagnosis not present

## 2020-01-21 DIAGNOSIS — R0602 Shortness of breath: Secondary | ICD-10-CM | POA: Diagnosis not present

## 2020-01-21 DIAGNOSIS — Z8673 Personal history of transient ischemic attack (TIA), and cerebral infarction without residual deficits: Secondary | ICD-10-CM | POA: Diagnosis not present

## 2020-01-21 DIAGNOSIS — R570 Cardiogenic shock: Secondary | ICD-10-CM | POA: Diagnosis not present

## 2020-01-21 DIAGNOSIS — N184 Chronic kidney disease, stage 4 (severe): Secondary | ICD-10-CM | POA: Diagnosis not present

## 2020-01-21 DIAGNOSIS — I739 Peripheral vascular disease, unspecified: Secondary | ICD-10-CM | POA: Diagnosis not present

## 2020-01-21 DIAGNOSIS — I4821 Permanent atrial fibrillation: Secondary | ICD-10-CM | POA: Diagnosis not present

## 2020-01-21 DIAGNOSIS — I482 Chronic atrial fibrillation, unspecified: Secondary | ICD-10-CM | POA: Diagnosis not present

## 2020-01-21 DIAGNOSIS — E782 Mixed hyperlipidemia: Secondary | ICD-10-CM | POA: Diagnosis present

## 2020-01-21 DIAGNOSIS — I70223 Atherosclerosis of native arteries of extremities with rest pain, bilateral legs: Secondary | ICD-10-CM | POA: Diagnosis present

## 2020-01-21 DIAGNOSIS — Z8674 Personal history of sudden cardiac arrest: Secondary | ICD-10-CM | POA: Diagnosis not present

## 2020-01-21 DIAGNOSIS — J9611 Chronic respiratory failure with hypoxia: Secondary | ICD-10-CM | POA: Diagnosis not present

## 2020-01-21 DIAGNOSIS — R079 Chest pain, unspecified: Secondary | ICD-10-CM | POA: Diagnosis not present

## 2020-01-21 LAB — SARS CORONAVIRUS 2 (TAT 6-24 HRS): SARS Coronavirus 2: NEGATIVE

## 2020-01-21 NOTE — Pre-Procedure Instructions (Signed)
Your procedure is scheduled on Friday, Jan 24, 2020 from 10:02 AM- 1:14 PM.  Report to [facility] Main Entrance "A" at 08:00 A.M., and check in at the Admitting office.  Call this number if you have problems the morning of surgery:  [phone]  Call [phone] if you have any questions prior to your surgery date Monday-Friday 8am-4pm    Remember:  Do not eat or drink after midnight the night before your surgery.     Take these medicines the morning of surgery with A SIP OF WATER: atorvastatin (LIPITOR) hydrALAZINE (APRESOLINE) isosorbide mononitrate (IMDUR)  pantoprazole (PROTONIX)  IF NEEDED: acetaminophen (TYLENOL)  nitroGLYCERIN (NITROSTAT) oxymetazoline (AFRIN) nasal spray albuterol (VENTOLIN HFA) inhaler Fluticasone-Salmeterol (ADVAIR DISKUS) inhaler  Please bring all inhalers with you the day of surgery.   Follow your surgeon's instructions on when to stop ELIQUIS.  If no instructions were given by your surgeon then you will need to call the office to get those instructions.    As of today, STOP taking any Aspirin (unless otherwise instructed by your surgeon) and Aspirin containing products, Aleve, Naproxen, Ibuprofen, Motrin, Advil, Goody's, BC's, all herbal medications, fish oil, and all vitamins.    HOW TO MANAGE YOUR DIABETES BEFORE AND AFTER SURGERY  Why is it important to control my blood sugar before and after surgery? . Improving blood sugar levels before and after surgery helps healing and can limit problems. . A way of improving blood sugar control is eating a healthy diet by: o  Eating less sugar and carbohydrates o  Increasing activity/exercise o  Talking with your doctor about reaching your blood sugar goals . High blood sugars (greater than 180 mg/dL) can raise your risk of infections and slow your recovery, so you will need to focus on controlling your diabetes during the weeks before surgery. . Make sure that the doctor who takes care of your  diabetes knows about your planned surgery including the date and location.  How do I manage my blood sugar before surgery? . Check your blood sugar at least 4 times a day, starting 2 days before surgery, to make sure that the level is not too high or low. . Check your blood sugar the morning of your surgery when you wake up and every 2 hours until you get to the Short Stay unit. o If your blood sugar is less than 70 mg/dL, you will need to treat for low blood sugar: - Treat a low blood sugar (less than 70 mg/dL) with  cup of clear juice (cranberry or apple), 4 glucose tablets, OR glucose gel. - Recheck blood sugar in 15 minutes after treatment (to make sure it is greater than 70 mg/dL). If your blood sugar is not greater than 70 mg/dL on recheck, call [phone] for further instructions. . Report your blood sugar to the short stay nurse when you get to Short Stay.  . If you are admitted to the hospital after surgery: o Your blood sugar will be checked by the staff and you will probably be given insulin after surgery (instead of oral diabetes medicines) to make sure you have good blood sugar levels. o The goal for blood sugar control after surgery is 80-180 mg/dL.           The Morning of Surgery:            Do not wear jewelry.            Do not wear lotions, powders, colognes, or deodorant.  Men may shave face and neck.            Do not bring valuables to the hospital.            [facility] is not responsible for any belongings or valuables.  Do NOT Smoke (Tobacco/Vapping) or drink Alcohol 24 hours prior to your procedure.  If you use a CPAP at night, you may bring all equipment for your overnight stay.   Contacts, glasses, dentures or bridgework may not be worn into surgery.      For patients admitted to the hospital, discharge time will be determined by your treatment team.   Patients discharged the day of surgery will not be allowed to drive home, and someone needs to  stay with them for 24 hours.    Special instructions:   [place]- Preparing For Surgery  Before surgery, you can play an important role. Because skin is not sterile, your skin needs to be as free of germs as possible. You can reduce the number of germs on your skin by washing with CHG (chlorahexidine gluconate) Soap before surgery.  CHG is an antiseptic cleaner which kills germs and bonds with the skin to continue killing germs even after washing.    Oral Hygiene is also important to reduce your risk of infection.  Remember - BRUSH YOUR TEETH THE MORNING OF SURGERY WITH YOUR REGULAR TOOTHPASTE  Please do not use if you have an allergy to CHG or antibacterial soaps. If your skin becomes reddened/irritated stop using the CHG.  Do not shave (including legs and underarms) for at least 48 hours prior to first CHG shower. It is OK to shave your face.  Please follow these instructions carefully.   1. Shower the NIGHT BEFORE SURGERY and the MORNING OF SURGERY with CHG Soap.   2. If you chose to wash your hair, wash your hair first as usual with your normal shampoo.  3. After you shampoo, rinse your hair and body thoroughly to remove the shampoo.  4. Use CHG as you would any other liquid soap. You can apply CHG directly to the skin and wash gently with a scrungie or a clean washcloth.   5. Apply the CHG Soap to your body ONLY FROM THE NECK DOWN.  Do not use on open wounds or open sores. Avoid contact with your eyes, ears, mouth and genitals (private parts). Wash Face and genitals (private parts)  with your normal soap.   6. Wash thoroughly, paying special attention to the area where your surgery will be performed.  7. Thoroughly rinse your body with warm water from the neck down.  8. DO NOT shower/wash with your normal soap after using and rinsing off the CHG Soap.  9. Pat yourself dry with a CLEAN TOWEL.  10. Wear CLEAN PAJAMAS to bed the night before surgery, wear comfortable clothes  the morning of surgery  11. Place CLEAN SHEETS on your bed the night of your first shower and DO NOT SLEEP WITH PETS.   Day of Surgery:   Do not apply any deodorants/lotions.  Please wear clean clothes to the hospital/surgery center.   Remember to brush your teeth WITH YOUR REGULAR TOOTHPASTE.   Please read over the following fact sheets that you were given.

## 2020-01-21 NOTE — Telephone Encounter (Signed)
Received fax from [facility], pt is sch for fem-fem bypass w/Dr Trula Slade on Fri 5/21 and they need clearance. Per Dr Haroldine Laws pt needs to be admitted tomorrow to get tuned up and ready for surgery. Pt is aware and agreeable, progressive care bed requested, [facility] is also aware.

## 2020-01-22 ENCOUNTER — Inpatient Hospital Stay (HOSPITAL_COMMUNITY)
Admission: RE | Admit: 2020-01-22 | Discharge: 2020-01-30 | DRG: 286 | Disposition: A | Discharge status: Home Health | Payer: Medicare HMO | Source: Ambulatory Visit | Attending: Internal Medicine | Admitting: Internal Medicine

## 2020-01-22 ENCOUNTER — Inpatient Hospital Stay (HOSPITAL_COMMUNITY)
Admission: RE | Admit: 2020-01-22 | Discharge: 2020-01-22 | Disposition: A | Discharge status: Home / Self Care | Payer: Medicare HMO | Source: Ambulatory Visit

## 2020-01-22 ENCOUNTER — Other Ambulatory Visit: Payer: Self-pay | Admitting: *Deleted

## 2020-01-22 ENCOUNTER — Telehealth (HOSPITAL_COMMUNITY): Payer: Self-pay | Admitting: Cardiology

## 2020-01-22 DIAGNOSIS — Z7951 Long term (current) use of inhaled steroids: Secondary | ICD-10-CM

## 2020-01-22 DIAGNOSIS — N184 Chronic kidney disease, stage 4 (severe): Secondary | ICD-10-CM | POA: Diagnosis present

## 2020-01-22 DIAGNOSIS — I130 Hypertensive heart and chronic kidney disease with heart failure and stage 1 through stage 4 chronic kidney disease, or unspecified chronic kidney disease: Secondary | ICD-10-CM | POA: Diagnosis present

## 2020-01-22 DIAGNOSIS — I480 Paroxysmal atrial fibrillation: Secondary | ICD-10-CM | POA: Diagnosis present

## 2020-01-22 DIAGNOSIS — Z8719 Personal history of other diseases of the digestive system: Secondary | ICD-10-CM

## 2020-01-22 DIAGNOSIS — Z20822 Contact with and (suspected) exposure to covid-19: Secondary | ICD-10-CM | POA: Diagnosis present

## 2020-01-22 DIAGNOSIS — E782 Mixed hyperlipidemia: Secondary | ICD-10-CM | POA: Diagnosis present

## 2020-01-22 DIAGNOSIS — R0600 Dyspnea, unspecified: Secondary | ICD-10-CM

## 2020-01-22 DIAGNOSIS — Z9981 Dependence on supplemental oxygen: Secondary | ICD-10-CM

## 2020-01-22 DIAGNOSIS — J9611 Chronic respiratory failure with hypoxia: Secondary | ICD-10-CM | POA: Diagnosis present

## 2020-01-22 DIAGNOSIS — R570 Cardiogenic shock: Secondary | ICD-10-CM | POA: Diagnosis present

## 2020-01-22 DIAGNOSIS — J441 Chronic obstructive pulmonary disease with (acute) exacerbation: Secondary | ICD-10-CM | POA: Diagnosis present

## 2020-01-22 DIAGNOSIS — D631 Anemia in chronic kidney disease: Secondary | ICD-10-CM | POA: Diagnosis present

## 2020-01-22 DIAGNOSIS — E871 Hypo-osmolality and hyponatremia: Secondary | ICD-10-CM | POA: Diagnosis present

## 2020-01-22 DIAGNOSIS — I739 Peripheral vascular disease, unspecified: Secondary | ICD-10-CM | POA: Diagnosis not present

## 2020-01-22 DIAGNOSIS — E1122 Type 2 diabetes mellitus with diabetic chronic kidney disease: Secondary | ICD-10-CM | POA: Diagnosis present

## 2020-01-22 DIAGNOSIS — I745 Embolism and thrombosis of iliac artery: Secondary | ICD-10-CM | POA: Diagnosis present

## 2020-01-22 DIAGNOSIS — E876 Hypokalemia: Secondary | ICD-10-CM | POA: Diagnosis present

## 2020-01-22 DIAGNOSIS — I70223 Atherosclerosis of native arteries of extremities with rest pain, bilateral legs: Secondary | ICD-10-CM | POA: Diagnosis present

## 2020-01-22 DIAGNOSIS — I482 Chronic atrial fibrillation, unspecified: Secondary | ICD-10-CM | POA: Diagnosis present

## 2020-01-22 DIAGNOSIS — Z789 Other specified health status: Secondary | ICD-10-CM

## 2020-01-22 DIAGNOSIS — I2510 Atherosclerotic heart disease of native coronary artery without angina pectoris: Secondary | ICD-10-CM | POA: Diagnosis present

## 2020-01-22 DIAGNOSIS — I5082 Biventricular heart failure: Secondary | ICD-10-CM | POA: Diagnosis present

## 2020-01-22 DIAGNOSIS — Z87891 Personal history of nicotine dependence: Secondary | ICD-10-CM

## 2020-01-22 DIAGNOSIS — I771 Stricture of artery: Secondary | ICD-10-CM | POA: Diagnosis not present

## 2020-01-22 DIAGNOSIS — I255 Ischemic cardiomyopathy: Secondary | ICD-10-CM | POA: Diagnosis present

## 2020-01-22 DIAGNOSIS — I714 Abdominal aortic aneurysm, without rupture: Secondary | ICD-10-CM | POA: Diagnosis present

## 2020-01-22 DIAGNOSIS — Z955 Presence of coronary angioplasty implant and graft: Secondary | ICD-10-CM | POA: Diagnosis not present

## 2020-01-22 DIAGNOSIS — Z79899 Other long term (current) drug therapy: Secondary | ICD-10-CM

## 2020-01-22 DIAGNOSIS — Z8674 Personal history of sudden cardiac arrest: Secondary | ICD-10-CM

## 2020-01-22 DIAGNOSIS — Z8673 Personal history of transient ischemic attack (TIA), and cerebral infarction without residual deficits: Secondary | ICD-10-CM | POA: Diagnosis not present

## 2020-01-22 DIAGNOSIS — E1151 Type 2 diabetes mellitus with diabetic peripheral angiopathy without gangrene: Secondary | ICD-10-CM | POA: Diagnosis present

## 2020-01-22 DIAGNOSIS — Z7901 Long term (current) use of anticoagulants: Secondary | ICD-10-CM

## 2020-01-22 DIAGNOSIS — Z8249 Family history of ischemic heart disease and other diseases of the circulatory system: Secondary | ICD-10-CM

## 2020-01-22 DIAGNOSIS — I4821 Permanent atrial fibrillation: Secondary | ICD-10-CM

## 2020-01-22 DIAGNOSIS — I5023 Acute on chronic systolic (congestive) heart failure: Secondary | ICD-10-CM

## 2020-01-22 DIAGNOSIS — I509 Heart failure, unspecified: Secondary | ICD-10-CM

## 2020-01-22 DIAGNOSIS — I5043 Acute on chronic combined systolic (congestive) and diastolic (congestive) heart failure: Secondary | ICD-10-CM | POA: Diagnosis present

## 2020-01-22 HISTORY — DX: Heart failure, unspecified: I50.9

## 2020-01-22 LAB — COMPREHENSIVE METABOLIC PANEL
ALT: 19 U/L (ref 0–44)
AST: 30 U/L (ref 15–41)
Albumin: 2.8 g/dL — ABNORMAL LOW (ref 3.5–5.0)
Alkaline Phosphatase: 213 U/L — ABNORMAL HIGH (ref 38–126)
Anion gap: 13 (ref 5–15)
BUN: 39 mg/dL — ABNORMAL HIGH (ref 8–23)
CO2: 22 mmol/L (ref 22–32)
Calcium: 8.5 mg/dL — ABNORMAL LOW (ref 8.9–10.3)
Chloride: 88 mmol/L — ABNORMAL LOW (ref 98–111)
Creatinine, Ser: 2.66 mg/dL — ABNORMAL HIGH (ref 0.61–1.24)
GFR calc Af Amer: 26 mL/min — ABNORMAL LOW (ref >60)
GFR calc non Af Amer: 22 mL/min — ABNORMAL LOW (ref >60)
Glucose, Bld: 120 mg/dL — ABNORMAL HIGH (ref 70–99)
Potassium: 5.6 mmol/L — ABNORMAL HIGH (ref 3.5–5.1)
Sodium: 123 mmol/L — ABNORMAL LOW (ref 135–145)
Total Bilirubin: 2 mg/dL — ABNORMAL HIGH (ref 0.3–1.2)
Total Protein: 7.3 g/dL (ref 6.5–8.1)

## 2020-01-22 LAB — CBC WITH DIFFERENTIAL/PLATELET
Abs Immature Granulocytes: 0.05 10*3/uL (ref 0.00–0.07)
Basophils Absolute: 0 10*3/uL (ref 0.0–0.1)
Basophils Relative: 0 %
Eosinophils Absolute: 0.1 10*3/uL (ref 0.0–0.5)
Eosinophils Relative: 1 %
HCT: 29.8 % — ABNORMAL LOW (ref 39.0–52.0)
Hemoglobin: 10.2 g/dL — ABNORMAL LOW (ref 13.0–17.0)
Immature Granulocytes: 1 %
Lymphocytes Relative: 10 %
Lymphs Abs: 0.9 10*3/uL (ref 0.7–4.0)
MCH: 25.5 pg — ABNORMAL LOW (ref 26.0–34.0)
MCHC: 34.2 g/dL (ref 30.0–36.0)
MCV: 74.5 fL — ABNORMAL LOW (ref 80.0–100.0)
Monocytes Absolute: 1 10*3/uL (ref 0.1–1.0)
Monocytes Relative: 11 %
Neutro Abs: 7 10*3/uL (ref 1.7–7.7)
Neutrophils Relative %: 77 %
Platelets: 248 10*3/uL (ref 150–400)
RBC: 4 MIL/uL — ABNORMAL LOW (ref 4.22–5.81)
RDW: 18 % — ABNORMAL HIGH (ref 11.5–15.5)
WBC: 9.1 10*3/uL (ref 4.0–10.5)
nRBC: 0.2 % (ref 0.0–0.2)

## 2020-01-22 LAB — BRAIN NATRIURETIC PEPTIDE: B Natriuretic Peptide: 2557.6 pg/mL — ABNORMAL HIGH (ref 0.0–100.0)

## 2020-01-22 MED ORDER — SODIUM CHLORIDE 0.9 % IV SOLN: 250.0000 mL | INTRAVENOUS | Status: DC | PRN

## 2020-01-22 MED ORDER — GABAPENTIN 300 MG PO CAPS
300.0000 mg | ORAL_CAPSULE | Freq: Every day | ORAL | Status: DC
  Administered 2020-01-22 – 2020-01-29 (×8): 300 mg via ORAL
  Filled 2020-01-22 (×8): qty 1

## 2020-01-22 MED ORDER — PANTOPRAZOLE SODIUM 40 MG PO TBEC
40.0000 mg | DELAYED_RELEASE_TABLET | Freq: Two times a day (BID) | ORAL | Status: DC
  Administered 2020-01-22 – 2020-01-30 (×16): 40 mg via ORAL
  Filled 2020-01-22 (×16): qty 1

## 2020-01-22 MED ORDER — ATORVASTATIN CALCIUM 80 MG PO TABS
80.0000 mg | ORAL_TABLET | Freq: Every day | ORAL | Status: DC
  Administered 2020-01-22 – 2020-01-30 (×9): 80 mg via ORAL
  Filled 2020-01-22 (×9): qty 1

## 2020-01-22 MED ORDER — HEPARIN (PORCINE) 25000 UT/250ML-% IV SOLN
1100.0000 [IU]/h | INTRAVENOUS | Status: DC
  Administered 2020-01-22: 1000 [IU]/h via INTRAVENOUS
  Administered 2020-01-24: 1100 [IU]/h via INTRAVENOUS
  Filled 2020-01-22: qty 250

## 2020-01-22 MED ORDER — ACETAMINOPHEN 325 MG PO TABS: 650.0000 mg | ORAL_TABLET | ORAL | Status: DC | PRN

## 2020-01-22 MED ORDER — HYDRALAZINE HCL 10 MG PO TABS
10.0000 mg | ORAL_TABLET | Freq: Two times a day (BID) | ORAL | Status: DC
  Administered 2020-01-23 – 2020-01-29 (×14): 10 mg via ORAL
  Filled 2020-01-22 (×15): qty 1

## 2020-01-22 MED ORDER — POTASSIUM CHLORIDE CRYS ER 20 MEQ PO TBCR: 20.0000 meq | EXTENDED_RELEASE_TABLET | Freq: Two times a day (BID) | ORAL | Status: DC

## 2020-01-22 MED ORDER — SPIRONOLACTONE 25 MG PO TABS
25.0000 mg | ORAL_TABLET | Freq: Every day | ORAL | Status: DC
  Administered 2020-01-25 – 2020-01-30 (×6): 25 mg via ORAL
  Filled 2020-01-22 (×6): qty 1

## 2020-01-22 MED ORDER — ALBUTEROL SULFATE HFA 108 (90 BASE) MCG/ACT IN AERS: 2.0000 | INHALATION_SPRAY | Freq: Four times a day (QID) | RESPIRATORY_TRACT | Status: DC | PRN

## 2020-01-22 MED ORDER — SODIUM CHLORIDE 0.9% FLUSH: 3.0000 mL | INTRAVENOUS | Status: DC | PRN

## 2020-01-22 MED ORDER — SODIUM CHLORIDE 0.9% FLUSH
3.0000 mL | Freq: Two times a day (BID) | INTRAVENOUS | Status: DC
  Administered 2020-01-22 – 2020-01-25 (×3): 3 mL via INTRAVENOUS

## 2020-01-22 MED ORDER — ENSURE ENLIVE PO LIQD
237.0000 mL | Freq: Every day | ORAL | Status: DC
  Administered 2020-01-22 – 2020-01-30 (×5): 237 mL via ORAL
  Filled 2020-01-22 (×3): qty 237

## 2020-01-22 MED ORDER — ISOSORBIDE MONONITRATE ER 30 MG PO TB24
30.0000 mg | ORAL_TABLET | Freq: Every day | ORAL | Status: DC
  Administered 2020-01-23 – 2020-01-30 (×8): 30 mg via ORAL
  Filled 2020-01-22 (×8): qty 1

## 2020-01-22 MED ORDER — FERROUS SULFATE 325 (65 FE) MG PO TABS
325.0000 mg | ORAL_TABLET | Freq: Two times a day (BID) | ORAL | Status: DC
  Administered 2020-01-22 – 2020-01-30 (×14): 325 mg via ORAL
  Filled 2020-01-22 (×14): qty 1

## 2020-01-22 MED ORDER — METOPROLOL SUCCINATE ER 25 MG PO TB24
12.5000 mg | ORAL_TABLET | Freq: Every day | ORAL | Status: DC
  Administered 2020-01-22 – 2020-01-23 (×2): 12.5 mg via ORAL
  Filled 2020-01-22 (×2): qty 1

## 2020-01-22 MED ORDER — FUROSEMIDE 10 MG/ML IJ SOLN
80.0000 mg | Freq: Once | INTRAMUSCULAR | Status: AC
  Administered 2020-01-22: 80 mg via INTRAVENOUS
  Filled 2020-01-22: qty 8

## 2020-01-22 MED ORDER — ONDANSETRON HCL 4 MG/2ML IJ SOLN
4.0000 mg | Freq: Four times a day (QID) | INTRAMUSCULAR | Status: DC | PRN
  Administered 2020-01-29: 4 mg via INTRAVENOUS
  Filled 2020-01-22: qty 2

## 2020-01-22 MED ORDER — ALBUTEROL SULFATE (2.5 MG/3ML) 0.083% IN NEBU
2.5000 mg | INHALATION_SOLUTION | Freq: Four times a day (QID) | RESPIRATORY_TRACT | Status: DC | PRN
  Administered 2020-01-27: 2.5 mg via RESPIRATORY_TRACT
  Filled 2020-01-22: qty 3
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
## 2020-01-22 NOTE — Patient Outreach (Signed)
[facility]) Care Management  White  01/22/2020   Logan White Jan 18, 1941 [id number]    Telephone Assessment   Referral received : 12/09/19 Referral source: Notification of Discharge from inpatient at [facility] on 12/05/19 Diagnosis : Heart Failure  Insurance: [place]   Patient is 79 year old male, with PMH that is significant for : chronic combined systolic diastolic CHF Biventricular failure , Cardiomyopathy, CAD, permanent Atrial fib, severe left PAD with know iliac artery occlusion,   Subjective:  Successful outreach call to patient wife, Logan White. She discussed patient recent visit with Dr. Trula Slade  and plans for surgery on this week. Wife discussed patient is still having pain,  reports gabapentin prescribed last week is not helping. She discussed patient  wound on right and left toe, wound care addressed by remote health RN. Wife  is awaiting call from [facility], as plans are for patient to be admitted today, for monitoring prior to planned surgery on 5/21.    Encounter Medications:  Outpatient Encounter Medications as of 01/22/2020  Medication Sig  . acetaminophen (TYLENOL) 325 MG tablet Take 650 mg by mouth every 6 (six) hours as needed for mild pain.   Marland Kitchen albuterol (VENTOLIN HFA) 108 (90 Base) MCG/ACT inhaler Inhale 2 puffs into the lungs every 6 (six) hours as needed for wheezing or shortness of breath.  Marland Kitchen atorvastatin (LIPITOR) 80 MG tablet TAKE 1 TABLET BY MOUTH EVERY DAY (Patient taking differently: Take 80 mg by mouth daily. )  . Camphor-Menthol (SARNA EX) Apply 1 application topically daily as needed (itching).  Marland Kitchen ELIQUIS 5 MG TABS tablet TAKE 1 TABLET BY MOUTH TWICE A DAY (Patient taking differently: Take 5 mg by mouth 2 (two) times daily. )  . Ensure (ENSURE) Take 237 mLs by mouth daily.   . ferrous sulfate 325 (65 FE) MG tablet Take 325 mg by mouth 2 (two) times daily.  . Fluticasone-Salmeterol (ADVAIR DISKUS)  250-50 MCG/DOSE AEPB Inhale 1 puff into the lungs 2 (two) times daily for 14 days. (Patient taking differently: Inhale 1 puff into the lungs 2 (two) times daily as needed (shortness of breath). )  . gabapentin (NEURONTIN) 300 MG capsule Take 300 mg by mouth at bedtime.   . hydrALAZINE (APRESOLINE) 10 MG tablet TAKE 1 TABLET BY MOUTH TWICE A DAY (Patient taking differently: Take 10 mg by mouth 2 (two) times daily. )  . hydrOXYzine (VISTARIL) 25 MG capsule Take 25 mg by mouth every 8 (eight) hours as needed for itching.   . isosorbide mononitrate (IMDUR) 30 MG 24 hr tablet Take 1 tablet (30 mg total) by mouth daily.  Marland Kitchen KLOR-CON M20 20 MEQ tablet TAKE 1 TABLET BY MOUTH TWICE A DAY (Patient taking differently: Take 20 mEq by mouth 2 (two) times daily. )  . metoprolol succinate (TOPROL-XL) 25 MG 24 hr tablet Take 0.5 tablets (12.5 mg total) by mouth at bedtime. Take with or immediately following a meal.  . nitroGLYCERIN (NITROSTAT) 0.4 MG SL tablet Place 1 tablet (0.4 mg total) under the tongue every 5 (five) minutes as needed for chest pain (Up to 3 doses).  Marland Kitchen oxymetazoline (AFRIN) 0.05 % nasal spray Place 1 spray into both nostrils 2 (two) times daily as needed for congestion.  . pantoprazole (PROTONIX) 40 MG tablet Take 1 tablet (40 mg total) by mouth 2 (two) times daily.  Marland Kitchen spironolactone (ALDACTONE) 25 MG tablet Take 1 tablet (25 mg total) by mouth daily.  Marland Kitchen torsemide (  DEMADEX) 100 MG tablet Take 1 tablet (100 mg total) by mouth 2 (two) times daily.   No facility-administered encounter medications on file as of 01/22/2020.    Functional Status:  In your present state of health, do you have any difficulty performing the following activities: 12/10/2019 11/25/2019  Hearing? N N  Vision? N N  Difficulty concentrating or making decisions? N N  Walking or climbing stairs? Y Y  Comment using walker -  Dressing or bathing? Y N  Comment wife helps -  Doing errands, shopping? Y N  Comment wife provides  transportation Facilities manager and eating ? N -  Comment wife prepares meals -  Using the Toilet? N -  In the past six months, have you accidently leaked urine? N -  Do you have problems with loss of bowel control? N -  Managing your Medications? Y -  Comment wife assist -  Managing your Finances? N -  Housekeeping or managing your Housekeeping? N -  Comment wife manages -  Some recent data might be hidden    Fall/Depression Screening: Fall Risk  01/01/2020 12/17/2019  Falls in the past year? 0 0  Number falls in past yr: 0 -  Risk for fall due to : Impaired balance/gait Impaired balance/gait  Follow up Falls prevention discussed Falls prevention discussed   PHQ 2/9 Scores 12/17/2019  PHQ - 2 Score 0    Assessment:  Atherosclerosis-  Planned admission on 5/19 prior to surgery on  01/24/20   Plan:  Will follow patient for  disposition/plans, will alert [facility] care management Hospital Liaison of current plan.   Joylene Draft, RN, BSN  [place] Management Coordinator  [phone]- Mobile [phone]- Toll Free Main Office

## 2020-01-22 NOTE — Progress Notes (Signed)
ANTICOAGULATION CONSULT NOTE - Initial Consult  Pharmacy Consult for Heparin Indication: atrial fibrillation  Patient Measurements: Height: 6\' 3"  (190.5 cm) Weight: 73.5 kg (162 lb 1.6 oz) IBW/kg (Calculated) : 84.5 Heparin Dosing Weight: 73.5kg  Vital Signs: Temp: 98.9 F (37.2 C) (05/19 1801) Temp Source: Oral (05/19 1801) BP: 92/67 (05/19 1801) Pulse Rate: 75 (05/19 1801)  Labs: No results for input(s): HGB, HCT, PLT, APTT, LABPROT, INR, HEPARINUNFRC, HEPRLOWMOCWT, CREATININE, CKTOTAL, CKMB, TROPONINIHS in the last 72 hours.  CrCl cannot be calculated (Patient's most recent lab result is older than the maximum 21 days allowed.).  Medical History: Past Medical History:  Diagnosis Date  . AAA (abdominal aortic aneurysm) ([place])    a. 08/2015 Abd U/S: 2.7 x 2.9 x 3.2 cm infrarenal AAA.  . Bradycardia    a. Requiring discontinuation of use of BB/CCB  . Cardiac arrest - ventricular fibrillation    a. 03/2012 in setting of hypokalemia (prolonged hosp with VDRF, tracheobronchitis, ARF, shock liver, PAF, AMS felt secondary to post-anoxic encephalopathy/shock).  . Carotid artery stenosis    a. 01/2011 - 40-59% bilateral stenosis;  b. 06/2015 Carotid U/S: 40-59% bilat ICA stenosis->f/u 6 mos.  . CKD (chronic kidney disease), stage III   . Coronary artery disease    a. s/p aborted ant STEMI tx with Cypher DES to LAD 10/04 (residual at cath: D1 50%, CFX 40% and multiple dist 70%, EF 55%);   b. myoview 3/10: Ef 47%, infero-apical isch, LOW RISK - med Tx recommended;  c. 12/2012 VF Arrest/Cath: LAD 40isr, 80 apical, LCX 100 (failed PTCA), RCA nondom.  . Diabetes mellitus   . Diverticulosis 2001  . Elevated LFTs    Shock liver 03/2012  . H/O: CVA (cardiovascular accident)   . Hematemesis    a. 12/2010 felt 2/2 Mallory Weiss tear - pt could not afford colonscopy/EGD at that time so Coumadin was deferred. Coumadin initiated 03/2012 without any evidence for bleeding.  Marland Kitchen History of Ischemic  Cardiomyopathy    a. EF 50-55% by echo 03/09/12 (was 30% by echo 02/24/12); b. 03/2013 Echo: EF 55-60%, mild MR, sev dil LA, mod dil RA, PASP 84mmHg.  Marland Kitchen Hypertensive heart disease    a. echo 4/12: EF 50%, asymmetric septal hypertrophy, no SAM or LVOT gradient, LAE, PASP 35  . Inguinal hernia   . 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 Mitral regurgitation    a. Mild by echo 03/2012 and 03/2013.  . Mixed Hyperlipidemia   . Peripheral vascular disease ([place])    a. h/o LE angioplasty;  b. 08/2015 Duplex: >50% RCIA, 100% REIA, >50% LEIA, elev vel in SMA, patent IVC.  Marland Kitchen Persistent atrial fibrillation ([place])    a. Dx 12/2010-->coumadin (CHA2DS2VASc = 7).  . QT prolongation    Assessment: [name] starting on heparin for afib (CHADS2VASc = 6). PTA Apixaban on hold for planned vascular surgery 5/21. Last dose 5/19 AM.  Patient has been more anemic (Hgb < 11) since March 2021, after undergoing fem-fem bypass. No reported bleeding.   Goal of Therapy:  Heparin level 0.3-0.7 units/ml Monitor platelets by anticoagulation protocol: Yes   Plan:  Start heparin 1000 units/hr, no bolus F/u 8 hour aptt/HL  F/u aPTT until correlates with heparin level  Monitor daily aPTT, HL, CBC/plt Monitor for signs/symptoms of bleeding    Benetta Spar, PharmD, BCPS, BCCP Clinical Pharmacist  Please check AMION for all [place] phone numbers After 10:00 PM, call [place]

## 2020-01-23 ENCOUNTER — Other Ambulatory Visit: Payer: Self-pay

## 2020-01-23 ENCOUNTER — Inpatient Hospital Stay (HOSPITAL_COMMUNITY): Payer: Medicare HMO

## 2020-01-23 ENCOUNTER — Ambulatory Visit: Payer: Medicare HMO | Admitting: Family Medicine

## 2020-01-23 DIAGNOSIS — I771 Stricture of artery: Secondary | ICD-10-CM

## 2020-01-23 LAB — BASIC METABOLIC PANEL
Anion gap: 10 (ref 5–15)
Anion gap: 10 (ref 5–15)
BUN: 41 mg/dL — ABNORMAL HIGH (ref 8–23)
BUN: 44 mg/dL — ABNORMAL HIGH (ref 8–23)
CO2: 22 mmol/L (ref 22–32)
CO2: 23 mmol/L (ref 22–32)
Calcium: 8.3 mg/dL — ABNORMAL LOW (ref 8.9–10.3)
Calcium: 8.3 mg/dL — ABNORMAL LOW (ref 8.9–10.3)
Chloride: 89 mmol/L — ABNORMAL LOW (ref 98–111)
Chloride: 89 mmol/L — ABNORMAL LOW (ref 98–111)
Creatinine, Ser: 2.54 mg/dL — ABNORMAL HIGH (ref 0.61–1.24)
Creatinine, Ser: 2.66 mg/dL — ABNORMAL HIGH (ref 0.61–1.24)
GFR calc Af Amer: 27 mL/min — ABNORMAL LOW (ref >60)
GFR calc Af Amer: 26 mL/min — ABNORMAL LOW (ref >60)
GFR calc non Af Amer: 24 mL/min — ABNORMAL LOW (ref >60)
GFR calc non Af Amer: 22 mL/min — ABNORMAL LOW (ref >60)
Glucose, Bld: 118 mg/dL — ABNORMAL HIGH (ref 70–99)
Glucose, Bld: 138 mg/dL — ABNORMAL HIGH (ref 70–99)
Potassium: 5 mmol/L (ref 3.5–5.1)
Potassium: 5 mmol/L (ref 3.5–5.1)
Sodium: 121 mmol/L — ABNORMAL LOW (ref 135–145)
Sodium: 122 mmol/L — ABNORMAL LOW (ref 135–145)

## 2020-01-23 LAB — APTT
aPTT: 83 seconds — ABNORMAL HIGH (ref 24–36)
aPTT: 67 seconds — ABNORMAL HIGH (ref 24–36)
aPTT: 87 seconds — ABNORMAL HIGH (ref 24–36)

## 2020-01-23 LAB — SURGICAL PCR SCREEN
MRSA, PCR: NEGATIVE
Staphylococcus aureus: POSITIVE — AB

## 2020-01-23 LAB — SODIUM: Sodium: 124 mmol/L — ABNORMAL LOW (ref 135–145)

## 2020-01-23 LAB — HEPARIN LEVEL (UNFRACTIONATED): Heparin Unfractionated: 0.92 IU/mL — ABNORMAL HIGH (ref 0.30–0.70)

## 2020-01-23 MED ORDER — CHLORHEXIDINE GLUCONATE CLOTH 2 % EX PADS
6.0000 | MEDICATED_PAD | Freq: Once | CUTANEOUS | Status: AC
  Administered 2020-01-24: 6 via TOPICAL

## 2020-01-23 MED ORDER — FUROSEMIDE 10 MG/ML IJ SOLN
160.0000 mg | Freq: Once | INTRAVENOUS | Status: AC
  Administered 2020-01-23: 160 mg via INTRAVENOUS
  Filled 2020-01-23: qty 16

## 2020-01-23 MED ORDER — TOLVAPTAN 15 MG PO TABS
15.0000 mg | ORAL_TABLET | ORAL | Status: AC
  Administered 2020-01-23: 15 mg via ORAL
  Filled 2020-01-23: qty 1

## 2020-01-23 MED ORDER — FUROSEMIDE 10 MG/ML IJ SOLN: 80.0000 mg | Freq: Two times a day (BID) | INTRAMUSCULAR | Status: DC

## 2020-01-23 MED ORDER — CEFAZOLIN SODIUM-DEXTROSE 2-4 GM/100ML-% IV SOLN
2.0000 g | INTRAVENOUS | Status: DC
  Filled 2020-01-23: qty 100

## 2020-01-23 MED ORDER — SODIUM CHLORIDE 0.9 % IV SOLN: INTRAVENOUS | Status: DC

## 2020-01-23 MED ORDER — CEFAZOLIN SODIUM-DEXTROSE 2-4 GM/100ML-% IV SOLN: 2.0000 g | INTRAVENOUS | Status: DC

## 2020-01-23 MED ORDER — MILRINONE LACTATE IN DEXTROSE 20-5 MG/100ML-% IV SOLN
0.1250 ug/kg/min | INTRAVENOUS | Status: DC
  Administered 2020-01-23: 0.125 ug/kg/min via INTRAVENOUS
  Filled 2020-01-23 (×2): qty 100

## 2020-01-23 MED ORDER — MUPIROCIN 2 % EX OINT
1.0000 "application " | TOPICAL_OINTMENT | Freq: Two times a day (BID) | CUTANEOUS | Status: AC
  Administered 2020-01-23 – 2020-01-27 (×10): 1 via NASAL
  Filled 2020-01-23 (×3): qty 22

## 2020-01-23 MED ORDER — CHLORHEXIDINE GLUCONATE CLOTH 2 % EX PADS
6.0000 | MEDICATED_PAD | Freq: Every day | CUTANEOUS | Status: AC
  Administered 2020-01-25 – 2020-01-27 (×3): 6 via TOPICAL

## 2020-01-23 MED ORDER — CHLORHEXIDINE GLUCONATE CLOTH 2 % EX PADS
6.0000 | MEDICATED_PAD | Freq: Once | CUTANEOUS | Status: AC
  Administered 2020-01-23: 6 via TOPICAL

## 2020-01-23 MED ORDER — FUROSEMIDE 10 MG/ML IJ SOLN: 80.0000 mg | Freq: Once | INTRAMUSCULAR | Status: DC

## 2020-01-23 NOTE — Progress Notes (Signed)
   01/23/20 0019  Assess: MEWS Score  Temp 98.4 F (36.9 C)  BP (!) 89/69  Pulse Rate (!) 106  Resp 17  SpO2 97 %  Assess: MEWS Score  MEWS Temp 0  MEWS Systolic 1  MEWS Pulse 1  MEWS RR 0  MEWS LOC 0  MEWS Score 2  MEWS Score Color Yellow  Assess: if the MEWS score is Yellow or Red  Were vital signs taken at a resting state? Yes  Focused Assessment Documented focused assessment  Early Detection of Sepsis Score *See Row Information* Low  MEWS guidelines implemented *See Row Information* Yes  Treat  MEWS Interventions Escalated (See documentation below)  Take Vital Signs  Increase Vital Sign Frequency  Yellow: Q 2hr X 2 then Q 4hr X 2, if remains yellow, continue Q 4hrs  Escalate  MEWS: Escalate Yellow: discuss with charge nurse/RN and consider discussing with provider and RRT  Notify: Charge Nurse/RN  Name of Charge Nurse/RN Notified Chanda Busing, RN  Date Charge Nurse/RN Notified 01/23/20  Time Charge Nurse/RN Notified 0030  Document  Patient Outcome Not stable and remains on department  Progress note created (see row info) Yes   Elesa Hacker, RN

## 2020-01-23 NOTE — Progress Notes (Addendum)
  Progress Note    01/23/2020 7:24 AM  Subjective:  Denies rest pain BLE   Vitals:   01/23/20 0200 01/23/20 0449  BP: (!) 89/66 (!) 85/76  Pulse: 92 94  Resp:  18  Temp:  98 F (36.7 C)  SpO2: 97% 97%   Physical Exam: Lungs:  Non labored Extremities:  Feet warm to touch; ulceration plantar base of R 1st metatarsal; L medal GT and foot ulceration without drainage  Abdomen:  soft Neurologic: A&O  CBC    Component Value Date/Time   WBC 9.1 01/22/2020 1850   RBC 4.00 (L) 01/22/2020 1850   HGB 10.2 (L) 01/22/2020 1850   HGB 12.7 (L) 07/29/2019 1004   HCT 29.8 (L) 01/22/2020 1850   HCT 38.3 07/29/2019 1004   PLT 248 01/22/2020 1850   PLT 191 07/29/2019 1004   MCV 74.5 (L) 01/22/2020 1850   MCV 80 07/29/2019 1004   MCH 25.5 (L) 01/22/2020 1850   MCHC 34.2 01/22/2020 1850   RDW 18.0 (H) 01/22/2020 1850   RDW 15.6 (H) 07/29/2019 1004   LYMPHSABS 0.9 01/22/2020 1850   MONOABS 1.0 01/22/2020 1850   EOSABS 0.1 01/22/2020 1850   BASOSABS 0.0 01/22/2020 1850    BMET    Component Value Date/Time   NA 121 (L) 01/23/2020 0343   NA 132 (L) 12/10/2019 1409   K 5.0 01/23/2020 0343   CL 89 (L) 01/23/2020 0343   CO2 22 01/23/2020 0343   GLUCOSE 118 (H) 01/23/2020 0343   BUN 41 (H) 01/23/2020 0343   BUN 63 (H) 12/10/2019 1409   CREATININE 2.54 (H) 01/23/2020 0343   CREATININE 1.34 (H) 10/31/2016 0844   CALCIUM 8.3 (L) 01/23/2020 0343   GFRNONAA 24 (L) 01/23/2020 0343   GFRNONAA 46 (L) 10/24/2014 0943   GFRAA 27 (L) 01/23/2020 0343   GFRAA 53 (L) 10/24/2014 0943    INR    Component Value Date/Time   INR 2.5 (H) 11/25/2019 1010     Intake/Output Summary (Last 24 hours) at 01/23/2020 0724 Last data filed at 01/23/2020 0451 Gross per 24 hour  Intake 53.34 ml  Output 425 ml  Net -371.66 ml     Assessment/Plan:  79 y.o. male with BLE critical limb ischemia  No rest pain BLE Patient admitted due to fluid retention in the presence of CHF Plan is for L iliac stent  with L to R femoral to femoral bypass and possible R CFA endarterectomy tomorrow if ok with Heart Failure team NPO past midnight   Dagoberto Ligas, PA-C [facility] [phone] 01/23/2020 7:24 AM   I agree with the above.  I seen evaluate the patient.  I appreciate the heart failure service patient's preoperative appears much better today than when he was in the office.  His creatinine is stable.  His Eliquis and is now no contraindications, we will plan on bypass graft.  He will be n.p.o. after midnight  [place] Willye Javier

## 2020-01-23 NOTE — Progress Notes (Signed)
ANTICOAGULATION CONSULT NOTE  Pharmacy Consult for Heparin Indication: atrial fibrillation  Patient Measurements: Height: 6\' 3"  (190.5 cm) Weight: 72.9 kg (160 lb 12.8 oz) IBW/kg (Calculated) : 84.5 Heparin Dosing Weight: 73.5kg  Vital Signs: Temp: 98 F (36.7 C) (05/20 0449) Temp Source: Oral (05/20 0449) BP: 85/76 (05/20 0449) Pulse Rate: 94 (05/20 0449)  Labs: Recent Labs    01/22/20 1850 01/23/20 0343  HGB 10.2*  --   HCT 29.8*  --   PLT 248  --   APTT  --  83*  HEPARINUNFRC  --  0.92*  CREATININE 2.66* 2.54*    Estimated Creatinine Clearance: 25.5 mL/min (A) (by C-G formula based on SCr of 2.54 mg/dL (H)).  Medical History: Past Medical History:  Diagnosis Date  . AAA (abdominal aortic aneurysm) ([place])    a. 08/2015 Abd U/S: 2.7 x 2.9 x 3.2 cm infrarenal AAA.  . Bradycardia    a. Requiring discontinuation of use of BB/CCB  . Cardiac arrest - ventricular fibrillation    a. 03/2012 in setting of hypokalemia (prolonged hosp with VDRF, tracheobronchitis, ARF, shock liver, PAF, AMS felt secondary to post-anoxic encephalopathy/shock).  . Carotid artery stenosis    a. 01/2011 - 40-59% bilateral stenosis;  b. 06/2015 Carotid U/S: 40-59% bilat ICA stenosis->f/u 6 mos.  . CKD (chronic kidney disease), stage III   . Coronary artery disease    a. s/p aborted ant STEMI tx with Cypher DES to LAD 10/04 (residual at cath: D1 50%, CFX 40% and multiple dist 70%, EF 55%);   b. myoview 3/10: Ef 47%, infero-apical isch, LOW RISK - med Tx recommended;  c. 12/2012 VF Arrest/Cath: LAD 40isr, 80 apical, LCX 100 (failed PTCA), RCA nondom.  . Diabetes mellitus   . Diverticulosis 2001  . Elevated LFTs    Shock liver 03/2012  . H/O: CVA (cardiovascular accident)   . Hematemesis    a. 12/2010 felt 2/2 Mallory Weiss tear - pt could not afford colonscopy/EGD at that time so Coumadin was deferred. Coumadin initiated 03/2012 without any evidence for bleeding.  Marland Kitchen History of Ischemic Cardiomyopathy     a. EF 50-55% by echo 03/09/12 (was 30% by echo 02/24/12); b. 03/2013 Echo: EF 55-60%, mild MR, sev dil LA, mod dil RA, PASP 61mmHg.  Marland Kitchen Hypertensive heart disease    a. echo 4/12: EF 50%, asymmetric septal hypertrophy, no SAM or LVOT gradient, LAE, PASP 35  . Inguinal hernia   . 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 Mitral regurgitation    a. Mild by echo 03/2012 and 03/2013.  . Mixed Hyperlipidemia   . Peripheral vascular disease ([place])    a. h/o LE angioplasty;  b. 08/2015 Duplex: >50% RCIA, 100% REIA, >50% LEIA, elev vel in SMA, patent IVC.  Marland Kitchen Persistent atrial fibrillation ([place])    a. Dx 12/2010-->coumadin (CHA2DS2VASc = 7).  . QT prolongation    Assessment: [name] starting on heparin for afib (CHADS2VASc = 6). PTA Apixaban on hold for planned vascular surgery 5/21. Last dose 5/19 AM.  Patient has been more anemic (Hgb < 11) since March 2021, after undergoing fem-fem bypass. No reported bleeding.   Heparin level this morning still remains falsely elevated at 0.92, aPTT came back therapeutic at 83 - not correlating. No s/sx of bleeding or infusion issues.   Goal of Therapy:  Heparin level 0.3-0.7 units/ml Monitor platelets by anticoagulation protocol: Yes   Plan:  Continue heparin 1000 units/hr F/u 8 hour aptt/HL  F/u  aPTT until correlates with heparin level  Monitor daily aPTT, HL, CBC/plt Monitor for signs/symptoms of bleeding   Antonietta Jewel, PharmD, BCCCP Clinical Pharmacist  01/23/2020 4:58 AM  Please check AMION for all [facility] phone numbers After 10:00 PM, call [place] [phone]

## 2020-01-23 NOTE — Progress Notes (Addendum)
Advanced Heart Failure Rounding Note  PCP-Cardiologist: Kirk Ruths, MD   Subjective:    Poor response to IV Lasix yesterday. Only 525 cc in UOP charted. Wt down 2 lb. SCr however better, down 2.66>>2.54.   Na 121  Denies dyspnea. Only complaint is LE pain.   Objective:   Weight Range: 72.9 kg Body mass index is 20.1 kg/m.   Vital Signs:   Temp:  [98 F (36.7 C)-99.3 F (37.4 C)] 98.1 F (36.7 C) (05/20 1228) Pulse Rate:  [75-109] 96 (05/20 1228) Resp:  [16-18] 17 (05/20 1228) BP: (85-99)/(63-80) 91/68 (05/20 1228) SpO2:  [91 %-99 %] 99 % (05/20 1228) Weight:  [72.9 kg-73.5 kg] 72.9 kg (05/20 0449)    Weight change: Filed Weights   01/22/20 1801 01/23/20 0449  Weight: 73.5 kg 72.9 kg    Intake/Output:   Intake/Output Summary (Last 24 hours) at 01/23/2020 1249 Last data filed at 01/23/2020 1000 Gross per 24 hour  Intake 233.34 ml  Output 625 ml  Net -391.66 ml      Physical Exam    General:  Thin AAM. No resp difficulty HEENT: Normal Neck: Supple. JVP elevated to ear. Carotids 2+ bilat; no bruits. No lymphadenopathy or thyromegaly appreciated. Cor: PMI nondisplaced. irregular rhythm, regular rate. No rubs, gallops or murmurs. Lungs: Clear Abdomen: Soft, nontender, nondistended. No hepatosplenomegaly. No bruits or masses. Good bowel sounds. Extremities: No cyanosis, clubbing, rash, 1+ bilateral LEE w/ bilateral leg wounds  Neuro: Alert & orientedx3, cranial nerves grossly intact. moves all 4 extremities w/o difficulty. Affect pleasant   Telemetry   afib 90s    Labs    CBC Recent Labs    01/22/20 1850  WBC 9.1  NEUTROABS 7.0  HGB 10.2*  HCT 29.8*  MCV 74.5*  PLT 270   Basic Metabolic Panel Recent Labs    01/22/20 1850 01/23/20 0343  NA 123* 121*  K 5.6* 5.0  CL 88* 89*  CO2 22 22  GLUCOSE 120* 118*  BUN 39* 41*  CREATININE 2.66* 2.54*  CALCIUM 8.5* 8.3*   Liver Function Tests Recent Labs    01/22/20 1850  AST 30  ALT  19  ALKPHOS 213*  BILITOT 2.0*  PROT 7.3  ALBUMIN 2.8*   No results for input(s): LIPASE, AMYLASE in the last 72 hours. Cardiac Enzymes No results for input(s): CKTOTAL, CKMB, CKMBINDEX, TROPONINI in the last 72 hours.  BNP: BNP (last 3 results) Recent Labs    11/25/19 1004 12/26/19 1515 01/22/20 1850  BNP 2,757.9* 1,843.7* 2,557.6*    ProBNP (last 3 results) Recent Labs    08/15/19 0949  PROBNP 16,511*     D-Dimer No results for input(s): DDIMER in the last 72 hours. Hemoglobin A1C No results for input(s): HGBA1C in the last 72 hours. Fasting Lipid Panel No results for input(s): CHOL, HDL, LDLCALC, TRIG, CHOLHDL, LDLDIRECT in the last 72 hours. Thyroid Function Tests No results for input(s): TSH, T4TOTAL, T3FREE, THYROIDAB in the last 72 hours.  Invalid input(s): FREET3  Other results:   Imaging     No results found.   Medications:     Scheduled Medications: . atorvastatin  80 mg Oral Daily  . feeding supplement (ENSURE ENLIVE)  237 mL Oral Daily  . ferrous sulfate  325 mg Oral BID WC  . gabapentin  300 mg Oral QHS  . hydrALAZINE  10 mg Oral BID  . isosorbide mononitrate  30 mg Oral Daily  . metoprolol succinate  12.5 mg  Oral QHS  . pantoprazole  40 mg Oral BID  . sodium chloride flush  3 mL Intravenous Q12H  . [START ON 01/25/2020] spironolactone  25 mg Oral Daily     Infusions: . sodium chloride    . [START ON 01/24/2020]  ceFAZolin (ANCEF) IV    . heparin 1,000 Units/hr (01/22/20 2139)     PRN Medications:  sodium chloride, acetaminophen, albuterol, ondansetron (ZOFRAN) IV, sodium chloride flush    Assessment/Plan   1. PAD w/ Claudication  - knownR external iliac artery occlusion with severe infrainguinal arterial occlusive disease and peroneal runoff only. L SFA occlusion with diffuse infrainguinal arterial occlusive disease - Dr. Trula Slade plans on a left iliac stent, bilateral femoral endarterectomies, and a left to right  femorofemoral bypass on 5/21  2. Chronic Biventricular CHF  - known ICM dating back to 2013.  - Echo 1/21showed LVEF 25-30%, global hypokinesis, mild LVH, RV mildly enlarged w/ severely reduced systolic function. Moderate MR. Mild TR. Severe bi atrial enlargement. - RHC on 3/25 showed elevated biventricular filling pressures and with equalization of R &L sided diastolic pressuresand preserved CO consistent withrestrictive physiology. - NYHA III but more so limited by leg claudication than exertional dyspnea.  -  volume overloaded on exam. JVD elevated. Wt up at 160 lb (recent d/c wt was 153 lb) - Poor initial response to 80 IV Lasix. Will give 160 mg IV Lasix now and follow response, if poor repoise, will have low threshold to start milrinone. - Continue 12.5 mg of Toprol XL at bed time.  - Continue spiro 25 mg daily.  - Continue hydralazine 10 mg bid - Continue Imdur 30 mg qd   3. CKD IIIb/IV:  - Creatinine baseline ~2.5 - Trend 2.66>>2.54  - Monitor   4 CAD:  - prior LAD and LCx stenting - denies anginal symptoms  - no ASA due to apixaban - continue high dose statin   6.Chronic AF - Continue metoprolol for rate control, monitor on tele  - On apixaban but will need to hold prior to surgery  - now on  IV heparin per pharmacy   7. Anemia , Iron Deficient  - Received Feraheme 3/29. - continue PO iron - Hgb 10.2 (up from 8.8 last month)  8. Hyponatremia - Na 121. mentating ok   - suspect hypervolemic hyponatremia - continue diuresis. Monitor closely  - will give dose of Tolvaptan 15 mg x 1   Length of Stay: 1  Brittainy Simmons, PA-C  01/23/2020, 12:49 PM  Advanced Heart Failure Team Pager [phone] (M-F; 7a - 4p)  Please contact [facility] for night-coverage after hours (4p -7a ) and weekends on [email or web address]  Patient seen and examined with the above-signed Advanced Practice Provider and/or Housestaff. I personally reviewed laboratory data, imaging studies  and relevant notes. I independently examined the patient and formulated the important aspects of the plan. I have edited the note to reflect any of my changes or salient points. I have personally discussed the plan with the patient and/or family.  Remains weak. Sodium down. Has not responded much to high-dose IV lasix or tolvaptan. Says leg pain is improved. Denies orthopnea or PND. Creatinine remains elevated. Plan for L iliac stent with L to R femoral to femoral bypass and possible R CFA endarterectomy tomorrow.   Exam Weak appearing no SOB JVP up  Cor IRR  Lungs clear Ab soft NT EXT 1+ edema. Cool. extensive scarring no palpable pulses.   Will start milrinone today  to facilitate diuresis and improve hemodynamics and renal blood flow. He is at high-risk for surgery but I am not sure there is much more we can do to optimize him further. Will re-evaluate early am. Off eliquis. Continue heparin.   Glori Bickers, MD  9:50 PM

## 2020-01-23 NOTE — Progress Notes (Signed)
ANTICOAGULATION CONSULT NOTE  Pharmacy Consult for Heparin Indication: atrial fibrillation  Patient Measurements: Height: 6\' 3"  (190.5 cm) Weight: 72.9 kg (160 lb 12.8 oz) IBW/kg (Calculated) : 84.5 Heparin Dosing Weight: 73.5kg  Vital Signs: Temp: 98.1 F (36.7 C) (05/20 1228) Temp Source: Oral (05/20 1228) BP: 91/68 (05/20 1228) Pulse Rate: 96 (05/20 1228)  Labs: Recent Labs    01/22/20 1850 01/23/20 0343 01/23/20 1155  HGB 10.2*  --   --   HCT 29.8*  --   --   PLT 248  --   --   APTT  --  83* 67*  HEPARINUNFRC  --  0.92*  --   CREATININE 2.66* 2.54*  --     Estimated Creatinine Clearance: 25.5 mL/min (A) (by C-G formula based on SCr of 2.54 mg/dL (H)).  Medical History: Past Medical History:  Diagnosis Date  . AAA (abdominal aortic aneurysm) ([place])    a. 08/2015 Abd U/S: 2.7 x 2.9 x 3.2 cm infrarenal AAA.  . Bradycardia    a. Requiring discontinuation of use of BB/CCB  . Cardiac arrest - ventricular fibrillation    a. 03/2012 in setting of hypokalemia (prolonged hosp with VDRF, tracheobronchitis, ARF, shock liver, PAF, AMS felt secondary to post-anoxic encephalopathy/shock).  . Carotid artery stenosis    a. 01/2011 - 40-59% bilateral stenosis;  b. 06/2015 Carotid U/S: 40-59% bilat ICA stenosis->f/u 6 mos.  . CKD (chronic kidney disease), stage III   . Coronary artery disease    a. s/p aborted ant STEMI tx with Cypher DES to LAD 10/04 (residual at cath: D1 50%, CFX 40% and multiple dist 70%, EF 55%);   b. myoview 3/10: Ef 47%, infero-apical isch, LOW RISK - med Tx recommended;  c. 12/2012 VF Arrest/Cath: LAD 40isr, 80 apical, LCX 100 (failed PTCA), RCA nondom.  . Diabetes mellitus   . Diverticulosis 2001  . Elevated LFTs    Shock liver 03/2012  . H/O: CVA (cardiovascular accident)   . Hematemesis    a. 12/2010 felt 2/2 Mallory Weiss tear - pt could not afford colonscopy/EGD at that time so Coumadin was deferred. Coumadin initiated 03/2012 without any evidence for  bleeding.  Marland Kitchen History of Ischemic Cardiomyopathy    a. EF 50-55% by echo 03/09/12 (was 30% by echo 02/24/12); b. 03/2013 Echo: EF 55-60%, mild MR, sev dil LA, mod dil RA, PASP 50mmHg.  Marland Kitchen Hypertensive heart disease    a. echo 4/12: EF 50%, asymmetric septal hypertrophy, no SAM or LVOT gradient, LAE, PASP 35  . Inguinal hernia   . 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 Mitral regurgitation    a. Mild by echo 03/2012 and 03/2013.  . Mixed Hyperlipidemia   . Peripheral vascular disease ([place])    a. h/o LE angioplasty;  b. 08/2015 Duplex: >50% RCIA, 100% REIA, >50% LEIA, elev vel in SMA, patent IVC.  Marland Kitchen Persistent atrial fibrillation ([place])    a. Dx 12/2010-->coumadin (CHA2DS2VASc = 7).  . QT prolongation    Assessment: [name] starting on heparin for afib (CHADS2VASc = 6). PTA Apixaban on hold for planned vascular surgery 5/21. Last dose 5/19 AM.  Patient has been more anemic (Hgb < 11) since March 2021, after undergoing fem-fem bypass. No reported bleeding.   Heparin level this morning still remains falsely elevated at 0.92, aPTT came back therapeutic at 83 - not correlating. No s/sx of bleeding or infusion issues.   Heparin level this afternoon now down to 67 - at  low end of goal.  No overt bleeding or complications noted.  Goal of Therapy:  Heparin level 0.3-0.7 units/ml  APTT 66-102  Monitor platelets by anticoagulation protocol: Yes   Plan:  Increase IV heparin to 1100 units/hr. Recheck aptt in 8 hrs. F/u aPTT until correlates with heparin level  Monitor daily aPTT, HL, CBC/plt Monitor for signs/symptoms of bleeding   Marguerite Olea, [facility] Clinical Pharmacist Phone [phone]  01/23/2020 2:39 PM

## 2020-01-23 NOTE — H&P (View-Only) (Signed)
Advanced Heart Failure Rounding Note  PCP-Cardiologist: Kirk Ruths, MD   Subjective:    Poor response to IV Lasix yesterday. Only 525 cc in UOP charted. Wt down 2 lb. SCr however better, down 2.66>>2.54.   Na 121  Denies dyspnea. Only complaint is LE pain.   Objective:   Weight Range: 72.9 kg Body mass index is 20.1 kg/m.   Vital Signs:   Temp:  [98 F (36.7 C)-99.3 F (37.4 C)] 98.1 F (36.7 C) (05/20 1228) Pulse Rate:  [75-109] 96 (05/20 1228) Resp:  [16-18] 17 (05/20 1228) BP: (85-99)/(63-80) 91/68 (05/20 1228) SpO2:  [91 %-99 %] 99 % (05/20 1228) Weight:  [72.9 kg-73.5 kg] 72.9 kg (05/20 0449)    Weight change: Filed Weights   01/22/20 1801 01/23/20 0449  Weight: 73.5 kg 72.9 kg    Intake/Output:   Intake/Output Summary (Last 24 hours) at 01/23/2020 1249 Last data filed at 01/23/2020 1000 Gross per 24 hour  Intake 233.34 ml  Output 625 ml  Net -391.66 ml      Physical Exam    General:  Thin AAM. No resp difficulty HEENT: Normal Neck: Supple. JVP elevated to ear. Carotids 2+ bilat; no bruits. No lymphadenopathy or thyromegaly appreciated. Cor: PMI nondisplaced. irregular rhythm, regular rate. No rubs, gallops or murmurs. Lungs: Clear Abdomen: Soft, nontender, nondistended. No hepatosplenomegaly. No bruits or masses. Good bowel sounds. Extremities: No cyanosis, clubbing, rash, 1+ bilateral LEE w/ bilateral leg wounds  Neuro: Alert & orientedx3, cranial nerves grossly intact. moves all 4 extremities w/o difficulty. Affect pleasant   Telemetry   afib 90s    Labs    CBC Recent Labs    01/22/20 1850  WBC 9.1  NEUTROABS 7.0  HGB 10.2*  HCT 29.8*  MCV 74.5*  PLT 585   Basic Metabolic Panel Recent Labs    01/22/20 1850 01/23/20 0343  NA 123* 121*  K 5.6* 5.0  CL 88* 89*  CO2 22 22  GLUCOSE 120* 118*  BUN 39* 41*  CREATININE 2.66* 2.54*  CALCIUM 8.5* 8.3*   Liver Function Tests Recent Labs    01/22/20 1850  AST 30  ALT  19  ALKPHOS 213*  BILITOT 2.0*  PROT 7.3  ALBUMIN 2.8*   No results for input(s): LIPASE, AMYLASE in the last 72 hours. Cardiac Enzymes No results for input(s): CKTOTAL, CKMB, CKMBINDEX, TROPONINI in the last 72 hours.  BNP: BNP (last 3 results) Recent Labs    11/25/19 1004 12/26/19 1515 01/22/20 1850  BNP 2,757.9* 1,843.7* 2,557.6*    ProBNP (last 3 results) Recent Labs    08/15/19 0949  PROBNP 16,511*     D-Dimer No results for input(s): DDIMER in the last 72 hours. Hemoglobin A1C No results for input(s): HGBA1C in the last 72 hours. Fasting Lipid Panel No results for input(s): CHOL, HDL, LDLCALC, TRIG, CHOLHDL, LDLDIRECT in the last 72 hours. Thyroid Function Tests No results for input(s): TSH, T4TOTAL, T3FREE, THYROIDAB in the last 72 hours.  Invalid input(s): FREET3  Other results:   Imaging     No results found.   Medications:     Scheduled Medications: . atorvastatin  80 mg Oral Daily  . feeding supplement (ENSURE ENLIVE)  237 mL Oral Daily  . ferrous sulfate  325 mg Oral BID WC  . gabapentin  300 mg Oral QHS  . hydrALAZINE  10 mg Oral BID  . isosorbide mononitrate  30 mg Oral Daily  . metoprolol succinate  12.5 mg  Oral QHS  . pantoprazole  40 mg Oral BID  . sodium chloride flush  3 mL Intravenous Q12H  . [START ON 01/25/2020] spironolactone  25 mg Oral Daily     Infusions: . sodium chloride    . [START ON 01/24/2020]  ceFAZolin (ANCEF) IV    . heparin 1,000 Units/hr (01/22/20 2139)     PRN Medications:  sodium chloride, acetaminophen, albuterol, ondansetron (ZOFRAN) IV, sodium chloride flush    Assessment/Plan   1. PAD w/ Claudication  - knownR external iliac artery occlusion with severe infrainguinal arterial occlusive disease and peroneal runoff only. L SFA occlusion with diffuse infrainguinal arterial occlusive disease - Dr. Trula Slade plans on a left iliac stent, bilateral femoral endarterectomies, and a left to right  femorofemoral bypass on 5/21  2. Chronic Biventricular CHF  - known ICM dating back to 2013.  - Echo 1/21showed LVEF 25-30%, global hypokinesis, mild LVH, RV mildly enlarged w/ severely reduced systolic function. Moderate MR. Mild TR. Severe bi atrial enlargement. - RHC on 3/25 showed elevated biventricular filling pressures and with equalization of R &L sided diastolic pressuresand preserved CO consistent withrestrictive physiology. - NYHA III but more so limited by leg claudication than exertional dyspnea.  -  volume overloaded on exam. JVD elevated. Wt up at 160 lb (recent d/c wt was 153 lb) - Poor initial response to 80 IV Lasix. Will give 160 mg IV Lasix now and follow response, if poor repoise, will have low threshold to start milrinone. - Continue 12.5 mg of Toprol XL at bed time.  - Continue spiro 25 mg daily.  - Continue hydralazine 10 mg bid - Continue Imdur 30 mg qd   3. CKD IIIb/IV:  - Creatinine baseline ~2.5 - Trend 2.66>>2.54  - Monitor   4 CAD:  - prior LAD and LCx stenting - denies anginal symptoms  - no ASA due to apixaban - continue high dose statin   6.Chronic AF - Continue metoprolol for rate control, monitor on tele  - On apixaban but will need to hold prior to surgery  - now on  IV heparin per pharmacy   7. Anemia , Iron Deficient  - Received Feraheme 3/29. - continue PO iron - Hgb 10.2 (up from 8.8 last month)  8. Hyponatremia - Na 121. mentating ok   - suspect hypervolemic hyponatremia - continue diuresis. Monitor closely  - will give dose of Tolvaptan 15 mg x 1   Length of Stay: 1  Brittainy Simmons, PA-C  01/23/2020, 12:49 PM  Advanced Heart Failure Team Pager [phone] (M-F; 7a - 4p)  Please contact [facility] for night-coverage after hours (4p -7a ) and weekends on [email or web address]  Patient seen and examined with the above-signed Advanced Practice Provider and/or Housestaff. I personally reviewed laboratory data, imaging studies  and relevant notes. I independently examined the patient and formulated the important aspects of the plan. I have edited the note to reflect any of my changes or salient points. I have personally discussed the plan with the patient and/or family.  Remains weak. Sodium down. Has not responded much to high-dose IV lasix or tolvaptan. Says leg pain is improved. Denies orthopnea or PND. Creatinine remains elevated. Plan for L iliac stent with L to R femoral to femoral bypass and possible R CFA endarterectomy tomorrow.   Exam Weak appearing no SOB JVP up  Cor IRR  Lungs clear Ab soft NT EXT 1+ edema. Cool. extensive scarring no palpable pulses.   Will start milrinone today  to facilitate diuresis and improve hemodynamics and renal blood flow. He is at high-risk for surgery but I am not sure there is much more we can do to optimize him further. Will re-evaluate early am. Off eliquis. Continue heparin.   Glori Bickers, MD  9:50 PM

## 2020-01-24 ENCOUNTER — Encounter (HOSPITAL_COMMUNITY)
Admission: RE | Disposition: A | Discharge status: Home / Self Care | Payer: Self-pay | Source: Ambulatory Visit | Attending: Internal Medicine

## 2020-01-24 ENCOUNTER — Inpatient Hospital Stay (HOSPITAL_COMMUNITY): Admission: RE | Admit: 2020-01-24 | Payer: Medicare HMO | Source: Home / Self Care | Admitting: Surgery

## 2020-01-24 ENCOUNTER — Encounter (HOSPITAL_COMMUNITY): Payer: Self-pay | Admitting: Internal Medicine

## 2020-01-24 ENCOUNTER — Inpatient Hospital Stay (HOSPITAL_COMMUNITY): Payer: Medicare HMO

## 2020-01-24 DIAGNOSIS — R570 Cardiogenic shock: Secondary | ICD-10-CM

## 2020-01-24 HISTORY — PX: RIGHT HEART CATH: CATH118263

## 2020-01-24 LAB — URINALYSIS, ROUTINE W REFLEX MICROSCOPIC
Bilirubin Urine: NEGATIVE
Glucose, UA: NEGATIVE mg/dL
Ketones, ur: NEGATIVE mg/dL
Nitrite: NEGATIVE
Protein, ur: NEGATIVE mg/dL
Specific Gravity, Urine: 1.003 — ABNORMAL LOW (ref 1.005–1.030)
pH: 6 (ref 5.0–8.0)

## 2020-01-24 LAB — CBC
HCT: 30.1 % — ABNORMAL LOW (ref 39.0–52.0)
Hemoglobin: 10.2 g/dL — ABNORMAL LOW (ref 13.0–17.0)
MCH: 25.6 pg — ABNORMAL LOW (ref 26.0–34.0)
MCHC: 33.9 g/dL (ref 30.0–36.0)
MCV: 75.4 fL — ABNORMAL LOW (ref 80.0–100.0)
Platelets: 254 10*3/uL (ref 150–400)
RBC: 3.99 MIL/uL — ABNORMAL LOW (ref 4.22–5.81)
RDW: 18.1 % — ABNORMAL HIGH (ref 11.5–15.5)
WBC: 10.6 10*3/uL — ABNORMAL HIGH (ref 4.0–10.5)
nRBC: 0.2 % (ref 0.0–0.2)

## 2020-01-24 LAB — COMPREHENSIVE METABOLIC PANEL
ALT: 16 U/L (ref 0–44)
AST: 25 U/L (ref 15–41)
Albumin: 2.5 g/dL — ABNORMAL LOW (ref 3.5–5.0)
Alkaline Phosphatase: 188 U/L — ABNORMAL HIGH (ref 38–126)
Anion gap: 10 (ref 5–15)
BUN: 44 mg/dL — ABNORMAL HIGH (ref 8–23)
CO2: 24 mmol/L (ref 22–32)
Calcium: 8.2 mg/dL — ABNORMAL LOW (ref 8.9–10.3)
Chloride: 89 mmol/L — ABNORMAL LOW (ref 98–111)
Creatinine, Ser: 2.71 mg/dL — ABNORMAL HIGH (ref 0.61–1.24)
GFR calc Af Amer: 25 mL/min — ABNORMAL LOW (ref >60)
GFR calc non Af Amer: 22 mL/min — ABNORMAL LOW (ref >60)
Glucose, Bld: 117 mg/dL — ABNORMAL HIGH (ref 70–99)
Potassium: 5.1 mmol/L (ref 3.5–5.1)
Sodium: 123 mmol/L — ABNORMAL LOW (ref 135–145)
Total Bilirubin: 1.8 mg/dL — ABNORMAL HIGH (ref 0.3–1.2)
Total Protein: 7 g/dL (ref 6.5–8.1)

## 2020-01-24 LAB — POCT I-STAT EG7
Acid-Base Excess: 2 mmol/L (ref 0.0–2.0)
Acid-Base Excess: 2 mmol/L (ref 0.0–2.0)
Bicarbonate: 26.9 mmol/L (ref 20.0–28.0)
Bicarbonate: 27 mmol/L (ref 20.0–28.0)
Calcium, Ion: 1.1 mmol/L — ABNORMAL LOW (ref 1.15–1.40)
Calcium, Ion: 1.12 mmol/L — ABNORMAL LOW (ref 1.15–1.40)
HCT: 33 % — ABNORMAL LOW (ref 39.0–52.0)
HCT: 34 % — ABNORMAL LOW (ref 39.0–52.0)
Hemoglobin: 11.2 g/dL — ABNORMAL LOW (ref 13.0–17.0)
Hemoglobin: 11.6 g/dL — ABNORMAL LOW (ref 13.0–17.0)
O2 Saturation: 57 %
O2 Saturation: 58 %
Potassium: 4.1 mmol/L (ref 3.5–5.1)
Potassium: 4.3 mmol/L (ref 3.5–5.1)
Sodium: 129 mmol/L — ABNORMAL LOW (ref 135–145)
Sodium: 129 mmol/L — ABNORMAL LOW (ref 135–145)
TCO2: 28 mmol/L (ref 22–32)
TCO2: 28 mmol/L (ref 22–32)
pCO2, Ven: 41.9 mmHg — ABNORMAL LOW (ref 44.0–60.0)
pCO2, Ven: 42.2 mmHg — ABNORMAL LOW (ref 44.0–60.0)
pH, Ven: 7.414 (ref 7.250–7.430)
pH, Ven: 7.415 (ref 7.250–7.430)
pO2, Ven: 29 mmHg — CL (ref 32.0–45.0)
pO2, Ven: 30 mmHg — CL (ref 32.0–45.0)

## 2020-01-24 LAB — BASIC METABOLIC PANEL
Anion gap: 15 (ref 5–15)
Anion gap: 15 (ref 5–15)
BUN: 45 mg/dL — ABNORMAL HIGH (ref 8–23)
BUN: 46 mg/dL — ABNORMAL HIGH (ref 8–23)
CO2: 20 mmol/L — ABNORMAL LOW (ref 22–32)
CO2: 21 mmol/L — ABNORMAL LOW (ref 22–32)
Calcium: 8.2 mg/dL — ABNORMAL LOW (ref 8.9–10.3)
Calcium: 8.4 mg/dL — ABNORMAL LOW (ref 8.9–10.3)
Chloride: 88 mmol/L — ABNORMAL LOW (ref 98–111)
Chloride: 90 mmol/L — ABNORMAL LOW (ref 98–111)
Creatinine, Ser: 2.64 mg/dL — ABNORMAL HIGH (ref 0.61–1.24)
Creatinine, Ser: 2.55 mg/dL — ABNORMAL HIGH (ref 0.61–1.24)
GFR calc Af Amer: 26 mL/min — ABNORMAL LOW (ref >60)
GFR calc Af Amer: 27 mL/min — ABNORMAL LOW (ref >60)
GFR calc non Af Amer: 23 mL/min — ABNORMAL LOW (ref >60)
GFR calc non Af Amer: 23 mL/min — ABNORMAL LOW (ref >60)
Glucose, Bld: 97 mg/dL (ref 70–99)
Glucose, Bld: 192 mg/dL — ABNORMAL HIGH (ref 70–99)
Potassium: 4.7 mmol/L (ref 3.5–5.1)
Potassium: 4.5 mmol/L (ref 3.5–5.1)
Sodium: 123 mmol/L — ABNORMAL LOW (ref 135–145)
Sodium: 126 mmol/L — ABNORMAL LOW (ref 135–145)

## 2020-01-24 LAB — HEPARIN LEVEL (UNFRACTIONATED)
Heparin Unfractionated: 0.69 IU/mL (ref 0.30–0.70)
Heparin Unfractionated: 0.41 IU/mL (ref 0.30–0.70)

## 2020-01-24 LAB — COOXEMETRY PANEL
Carboxyhemoglobin: 1.3 % (ref 0.5–1.5)
Carboxyhemoglobin: 1.2 % (ref 0.5–1.5)
Methemoglobin: 1.1 % (ref 0.0–1.5)
Methemoglobin: 0.3 % (ref 0.0–1.5)
O2 Saturation: 36.3 %
O2 Saturation: 57.3 %
Total hemoglobin: 10.6 g/dL — ABNORMAL LOW (ref 12.0–16.0)
Total hemoglobin: 9.8 g/dL — ABNORMAL LOW (ref 12.0–16.0)

## 2020-01-24 LAB — PROTIME-INR
INR: 1.9 — ABNORMAL HIGH (ref 0.8–1.2)
Prothrombin Time: 21 seconds — ABNORMAL HIGH (ref 11.4–15.2)

## 2020-01-24 LAB — SODIUM: Sodium: 129 mmol/L — ABNORMAL LOW (ref 135–145)

## 2020-01-24 LAB — APTT
aPTT: 92 seconds — ABNORMAL HIGH (ref 24–36)
aPTT: 82 seconds — ABNORMAL HIGH (ref 24–36)

## 2020-01-24 SURGERY — RIGHT HEART CATH
Anesthesia: LOCAL

## 2020-01-24 SURGERY — CREATION, BYPASS, ARTERIAL, FEMORAL TO FEMORAL, USING GRAFT
Anesthesia: General | Laterality: Left

## 2020-01-24 MED ORDER — HEPARIN (PORCINE) 25000 UT/250ML-% IV SOLN
1300.0000 [IU]/h | INTRAVENOUS | Status: DC
  Administered 2020-01-24 – 2020-01-27 (×4): 1100 [IU]/h via INTRAVENOUS
  Administered 2020-01-29 (×2): 1200 [IU]/h via INTRAVENOUS
  Filled 2020-01-24 (×6): qty 250

## 2020-01-24 MED ORDER — ASPIRIN 81 MG PO CHEW
81.0000 mg | CHEWABLE_TABLET | ORAL | Status: AC
  Administered 2020-01-24: 81 mg via ORAL
  Filled 2020-01-24: qty 1

## 2020-01-24 MED ORDER — SODIUM CHLORIDE 0.9% FLUSH
3.0000 mL | Freq: Two times a day (BID) | INTRAVENOUS | Status: DC
  Administered 2020-01-24 – 2020-01-25 (×2): 3 mL via INTRAVENOUS

## 2020-01-24 MED ORDER — SODIUM CHLORIDE 0.9 % IV SOLN
INTRAVENOUS | Status: AC | PRN
  Administered 2020-01-24: 10 mL/h via INTRAVENOUS

## 2020-01-24 MED ORDER — TOLVAPTAN 15 MG PO TABS
15.0000 mg | ORAL_TABLET | ORAL | Status: DC
  Administered 2020-01-24: 15 mg via ORAL
  Filled 2020-01-24: qty 1

## 2020-01-24 MED ORDER — SODIUM CHLORIDE 0.9 % IV SOLN: 250.0000 mL | INTRAVENOUS | Status: DC | PRN

## 2020-01-24 MED ORDER — HEPARIN (PORCINE) IN NACL 1000-0.9 UT/500ML-% IV SOLN
INTRAVENOUS | Status: DC | PRN
  Administered 2020-01-24 (×3): 500 mL

## 2020-01-24 MED ORDER — ORAL CARE MOUTH RINSE: 15.0000 mL | Freq: Once | OROMUCOSAL | Status: DC

## 2020-01-24 MED ORDER — SODIUM CHLORIDE 0.9 % IV SOLN: INTRAVENOUS | Status: DC

## 2020-01-24 MED ORDER — HEPARIN (PORCINE) IN NACL 1000-0.9 UT/500ML-% IV SOLN
INTRAVENOUS | Status: DC | PRN
  Administered 2020-01-24: 500 mL

## 2020-01-24 MED ORDER — LIDOCAINE HCL (PF) 1 % IJ SOLN
INTRAMUSCULAR | Status: DC | PRN
  Administered 2020-01-24: 10 mL

## 2020-01-24 MED ORDER — SODIUM CHLORIDE 0.9% FLUSH: 3.0000 mL | INTRAVENOUS | Status: DC | PRN

## 2020-01-24 MED ORDER — HEPARIN (PORCINE) IN NACL 1000-0.9 UT/500ML-% IV SOLN
INTRAVENOUS | Status: AC
  Filled 2020-01-24: qty 2000

## 2020-01-24 MED ORDER — MILRINONE LACTATE IN DEXTROSE 20-5 MG/100ML-% IV SOLN
0.2500 ug/kg/min | INTRAVENOUS | Status: DC
  Administered 2020-01-24 – 2020-01-26 (×4): 0.25 ug/kg/min via INTRAVENOUS
  Filled 2020-01-24 (×4): qty 100

## 2020-01-24 MED ORDER — METOPROLOL SUCCINATE ER 25 MG PO TB24: 12.5000 mg | ORAL_TABLET | Freq: Two times a day (BID) | ORAL | Status: DC

## 2020-01-24 MED ORDER — ASPIRIN 81 MG PO CHEW
81.0000 mg | CHEWABLE_TABLET | ORAL | Status: DC
Start: 2020-01-25 — End: 2020-01-24

## 2020-01-24 MED ORDER — CHLORHEXIDINE GLUCONATE 0.12 % MT SOLN: 15.0000 mL | Freq: Once | OROMUCOSAL | Status: DC

## 2020-01-24 MED ORDER — LIDOCAINE HCL (PF) 1 % IJ SOLN
INTRAMUSCULAR | Status: AC
  Filled 2020-01-24: qty 30

## 2020-01-24 MED ORDER — HEPARIN (PORCINE) 25000 UT/250ML-% IV SOLN: 1100.0000 [IU]/h | INTRAVENOUS | Status: DC

## 2020-01-24 MED ORDER — CHLORHEXIDINE GLUCONATE 0.12 % MT SOLN
OROMUCOSAL | Status: AC
  Filled 2020-01-24: qty 15

## 2020-01-24 MED ORDER — ZOLPIDEM TARTRATE 5 MG PO TABS
5.0000 mg | ORAL_TABLET | Freq: Every evening | ORAL | Status: DC | PRN
  Administered 2020-01-24 – 2020-01-30 (×3): 5 mg via ORAL
  Filled 2020-01-24 (×5): qty 1

## 2020-01-24 MED ORDER — LACTATED RINGERS IV SOLN: INTRAVENOUS | Status: DC

## 2020-01-24 MED ORDER — FUROSEMIDE 10 MG/ML IJ SOLN
160.0000 mg | Freq: Once | INTRAVENOUS | Status: AC
  Administered 2020-01-24: 160 mg via INTRAVENOUS
  Filled 2020-01-24: qty 16

## 2020-01-24 SURGICAL SUPPLY — 11 items
CATH SWAN GANZ 7F STRAIGHT (CATHETERS) ×1 IMPLANT
PACK CARDIAC CATHETERIZATION (CUSTOM PROCEDURE TRAY) ×2 IMPLANT
PROTECTION STATION PRESSURIZED (MISCELLANEOUS) ×2
SHEATH PINNACLE 7F 10CM (SHEATH) ×1 IMPLANT
SHEATH PROBE COVER 6X72 (BAG) ×1 IMPLANT
SLEEVE REPOSITIONING LENGTH 30 (MISCELLANEOUS) ×1 IMPLANT
STATION PROTECTION PRESSURIZED (MISCELLANEOUS) IMPLANT
TRANSDUCER W/STOPCOCK (MISCELLANEOUS) ×2 IMPLANT
TUBING ART PRESS 72  MALE/FEM (TUBING) ×2
TUBING ART PRESS 72 MALE/FEM (TUBING) IMPLANT
WIRE HI TORQ VERSACORE-J 145CM (WIRE) ×1 IMPLANT

## 2020-01-24 NOTE — TOC Initial Note (Signed)
Transition of Care [facility]) - Initial/Assessment Note    Patient Details  Name: Logan White MRN: [id number] Date of Birth: 08/23/1941  Transition of Care [facility]) CM/SW Contact:    Bethena Roys, RN Phone Number: 01/24/2020, 3:58 PM  Clinical Narrative: High risk for readmission assessment completed. Patient presented for scheduled Left bypass graft femoral-femoral artery w/ left iliac stent. Patient was a direct admit for heart failure monitoring prior to surgery. Prior to admission patient was from home with spouse. Patient is currently active with [facility] for Registered Nurse Services -patient will need resumption orders once stable to transition home. Patient has durable medical equipment cane in the home. Patient has a primary care provider and he gets his medications from  [facility]. Patient is currently on IV Milrinone. Patient is followed by [facility] in the community. Case Manager will continue to follow for additional transition of care needs.                  Expected Discharge Plan: [name] Barriers to Discharge: Continued Medical Work up   Patient Goals and CMS Choice Patient states their goals for this hospitalization and ongoing recovery are:: to return home and get better.    Expected Discharge Plan and Services Expected Discharge Plan: [place] In-house Referral: NA Discharge Planning Services: CM Consult Post Acute Care Choice: Home Health, Resumption of Svcs/PTA Provider(Active with [facility].) Living arrangements for the past 2 months: Single Family Home                  HH Arranged: RN, Disease Management [place] Agency: [facility] Date HH Agency Contacted: 01/24/20 Time HH Agency Contacted: 46 Representative spoke with at [place]: [place] Arrangements/Services Living arrangements for the past 2 months: [place] with:: Spouse Patient  language and need for interpreter reviewed:: Yes Do you feel safe going back to the place where you live?: Yes      Need for Family Participation in Patient Care: Yes (Comment) Care giver support system in place?: Yes (comment) Current home services: DME(Patient has a cane) Criminal Activity/Legal Involvement Pertinent to Current Situation/Hospitalization: No - Comment as needed  Activities of Daily Living Home Assistive Devices/Equipment: Cane (specify quad or straight) ADL Screening (condition at time of admission) Patient's cognitive ability adequate to safely complete daily activities?: Yes Is the patient deaf or have difficulty hearing?: No Does the patient have difficulty seeing, even when wearing glasses/contacts?: No Does the patient have difficulty concentrating, remembering, or making decisions?: No Patient able to express need for assistance with ADLs?: Yes Does the patient have difficulty dressing or bathing?: No Independently performs ADLs?: Yes (appropriate for developmental age) Does the patient have difficulty walking or climbing stairs?: Yes Weakness of Legs: Both Weakness of Arms/Hands: None  Permission Sought/Granted Permission sought to share information with : Family Supports, Chartered certified accountant granted to share information with : Yes, Verbal Permission Granted     Permission granted to share info w AGENCY: [place]        Emotional Assessment Appearance:: Appears stated age Attitude/Demeanor/Rapport: Engaged Affect (typically observed): Appropriate Orientation: : Oriented to Situation, Oriented to  Time, Oriented to Place, Oriented to Self Alcohol / Substance Use: Not Applicable Psych Involvement: No (comment)  Admission diagnosis:  Acute on chronic systolic CHF (congestive heart failure), NYHA class 3 ([place]) [I50.23] Patient Active Problem List   Diagnosis Date  Noted  . Acute on chronic systolic CHF (congestive heart failure), NYHA  class 3 ([place]) 01/22/2020  . Acute on chronic combined systolic and diastolic CHF (congestive heart failure) ([place]) 11/25/2019  . Palliative care by specialist   . Goals of care, counseling/discussion   . PVD (peripheral vascular disease) ([place]) 11/15/2019  . Gastritis and gastroduodenitis   . Benign neoplasm of transverse colon   . Microcytic anemia   . Acute blood loss anemia   . Melena   . CHF (congestive heart failure) ([place]) 11/04/2019  . CHF (congestive heart failure), NYHA class IV, acute, systolic ([place]) 90/24/0973  . Persistent atrial fibrillation ([place])   . Acute on chronic systolic CHF (congestive heart failure) ([place]) 06/22/2019  . Acute on chronic systolic (congestive) heart failure ([place]) 06/22/2019  . CAD S/P PCI 10/13/2015  . Cardiomyopathy, new. Etiology apeears to be NICM 10/13/2015  . AAA (abdominal aortic aneurysm) ([place])   . Peripheral vascular disease ([place])   . Carotid artery stenosis   . Hypertensive heart disease   . Bruit 10/24/2014  . Acute on chronic renal failure ([place]) 01/11/2013  . Acute MI, anterolateral wall, initial episode of care ([place]) 12/31/2012  . Cardiac arrest due to underlying cardiac condition ([place]) 12/31/2012  . VF (ventricular fibrillation) ([place]) 12/31/2012  . Abnormal LFTs 12/31/2012  . Acute on chronic renal insufficiency 12/31/2012  . Chronic anticoagulation 12/31/2012  . Hypokalemia 12/31/2012  . Weakness 04/23/2012  . Long term (current) use of anticoagulants 03/19/2012  . CKD (chronic kidney disease), stage III 03/16/2012  . Acute confusion/delerium 03/12/2012  . Delirium 03/12/2012  . Acute systolic CHF (congestive heart failure) ([place]) 03/11/2012  . Septic shock(785.52) 02/27/2012    Class: Acute  . Cardiogenic shock ([place]) 02/25/2012  . Acute renal failure ([place]) 02/25/2012  . Anoxic encephalopathy ([place]) 02/25/2012  . Ischemic cardiomyopathy 02/25/2012  . Cardiac arrest ([place]) 02/23/2012  . Acute respiratory failure with hypoxia ([place])  02/23/2012  . Ventricular fibrillation ([place]) 02/23/2012  . DM (diabetes mellitus) ([place]) 02/23/2012  . Screening for colon cancer 02/10/2011  . Hematemesis 01/13/2011  . Dyspnea 01/13/2011  . RENAL INSUFFICIENCY 08/26/2010  . HYPERPOTASSEMIA 09/15/2009  . TOBACCO ABUSE 08/11/2009  . GLUCOSE INTOLERANCE 10/02/2008  . Hyperlipidemia 10/02/2008  . Essential hypertension 10/02/2008  . Cerebrovascular disease 10/02/2008  . PERIPHERAL VASCULAR DISEASE 10/02/2008  . INGUINAL HERNIA 10/02/2008   PCP:  Patient, No Pcp Per Pharmacy:   [facility], [address] [place] [address] Phone: [phone] Fax: [phone]  [id number] - [place], [place] [address] Phone: [phone] Fax: [phone]  [facility] [place] [place] [address] Phone: [phone] Fax: [phone]   Readmission Risk Interventions Readmission Risk Prevention Plan 01/24/2020 12/04/2019 11/18/2019  Transportation Screening Complete Complete Complete  PCP or Specialist Appt within 3-5 Days - - -  HRI or [place] Work Consult for [place] - - -  Medication Review Press photographer) Complete Complete Complete  PCP or Specialist appointment within 3-5 days of discharge Complete Complete Complete  HRI or Home Care Consult Complete Complete Complete  SW Recovery Care/Counseling Consult Complete Complete Complete  Palliative Care Screening Not Applicable Not Applicable Complete  Skilled Nursing Facility Complete Not Applicable Not Applicable  Some recent data might be hidden

## 2020-01-24 NOTE — Anesthesia Preprocedure Evaluation (Signed)
Anesthesia Evaluation    Airway        Dental   Pulmonary former smoker,           Cardiovascular      Neuro/Psych    GI/Hepatic   Endo/Other  diabetes  Renal/GU      Musculoskeletal   Abdominal   Peds  Hematology   Anesthesia Other Findings   Reproductive/Obstetrics                             Anesthesia Physical Anesthesia Plan  ASA:   Anesthesia Plan:    Post-op Pain Management:    Induction:   PONV Risk Score and Plan:   Airway Management Planned:   Additional Equipment:   Intra-op Plan:   Post-operative Plan:   Informed Consent:   Plan Discussed with:   Anesthesia Plan Comments: (Case cancelled. Pt with multiple medical problems including ICM w/ EF 25%, hyponatremia, a-fib, ESRD, PVD. D/w Drs. Brabham and Bensimon who agreed to try to medically optimize patient over the weekend for a high risk procedure Monday.)        Anesthesia Quick Evaluation

## 2020-01-24 NOTE — Plan of Care (Signed)
  Problem: Education: Goal: Knowledge of General Education information will improve Description: Including pain rating scale, medication(s)/side effects and non-pharmacologic comfort measures Outcome: Progressing   Problem: Clinical Measurements: Goal: Will remain free from infection Outcome: Progressing   Problem: Nutrition: Goal: Adequate nutrition will be maintained Outcome: Progressing   Problem: Coping: Goal: Level of anxiety will decrease Outcome: Progressing   Problem: Pain Managment: Goal: General experience of comfort will improve Outcome: Progressing   Problem: Safety: Goal: Ability to remain free from injury will improve Outcome: Progressing

## 2020-01-24 NOTE — Progress Notes (Signed)
ANTICOAGULATION CONSULT NOTE  Pharmacy Consult for Heparin Indication: atrial fibrillation  Patient Measurements: Height: 6\' 3"  (190.5 cm) Weight: 72.9 kg (160 lb 12.8 oz) IBW/kg (Calculated) : 84.5 Heparin Dosing Weight: 73.5kg  Vital Signs: Temp: 98.7 F (37.1 C) (05/20 2226) Temp Source: Oral (05/20 2226) BP: 106/64 (05/20 2226) Pulse Rate: 103 (05/20 2259)  Labs: Recent Labs    01/22/20 1850 01/23/20 0343 01/23/20 1155 01/23/20 1408 01/23/20 2223  HGB 10.2*  --   --   --   --   HCT 29.8*  --   --   --   --   PLT 248  --   --   --   --   APTT  --  83* 67*  --  87*  HEPARINUNFRC  --  0.92*  --   --   --   CREATININE 2.66* 2.54*  --  2.66*  --     Estimated Creatinine Clearance: 24.4 mL/min (A) (by C-G formula based on SCr of 2.66 mg/dL (H)).  Medical History: Past Medical History:  Diagnosis Date  . AAA (abdominal aortic aneurysm) ([place])    a. 08/2015 Abd U/S: 2.7 x 2.9 x 3.2 cm infrarenal AAA.  . Bradycardia    a. Requiring discontinuation of use of BB/CCB  . Cardiac arrest - ventricular fibrillation    a. 03/2012 in setting of hypokalemia (prolonged hosp with VDRF, tracheobronchitis, ARF, shock liver, PAF, AMS felt secondary to post-anoxic encephalopathy/shock).  . Carotid artery stenosis    a. 01/2011 - 40-59% bilateral stenosis;  b. 06/2015 Carotid U/S: 40-59% bilat ICA stenosis->f/u 6 mos.  . CKD (chronic kidney disease), stage III   . Coronary artery disease    a. s/p aborted ant STEMI tx with Cypher DES to LAD 10/04 (residual at cath: D1 50%, CFX 40% and multiple dist 70%, EF 55%);   b. myoview 3/10: Ef 47%, infero-apical isch, LOW RISK - med Tx recommended;  c. 12/2012 VF Arrest/Cath: LAD 40isr, 80 apical, LCX 100 (failed PTCA), RCA nondom.  . Diabetes mellitus   . Diverticulosis 2001  . Elevated LFTs    Shock liver 03/2012  . H/O: CVA (cardiovascular accident)   . Hematemesis    a. 12/2010 felt 2/2 Mallory Weiss tear - pt could not afford colonscopy/EGD at  that time so Coumadin was deferred. Coumadin initiated 03/2012 without any evidence for bleeding.  Marland Kitchen History of Ischemic Cardiomyopathy    a. EF 50-55% by echo 03/09/12 (was 30% by echo 02/24/12); b. 03/2013 Echo: EF 55-60%, mild MR, sev dil LA, mod dil RA, PASP 70mmHg.  Marland Kitchen Hypertensive heart disease    a. echo 4/12: EF 50%, asymmetric septal hypertrophy, no SAM or LVOT gradient, LAE, PASP 35  . Inguinal hernia   . 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 Iron deficiency anemia   . Mitral regurgitation    a. Mild by echo 03/2012 and 03/2013.  . Mixed Hyperlipidemia   . Peripheral vascular disease ([place])    a. h/o LE angioplasty;  b. 08/2015 Duplex: >50% RCIA, 100% REIA, >50% LEIA, elev vel in SMA, patent IVC.  Marland Kitchen Persistent atrial fibrillation ([place])    a. Dx 12/2010-->coumadin (CHA2DS2VASc = 7).  . QT prolongation   . Tubular adenoma of colon 11/2019   Assessment: [name] starting on heparin for afib (CHADS2VASc = 6). PTA Apixaban on hold for planned vascular surgery 5/21. Last dose 5/19 AM.  Patient has been more anemic (Hgb < 11) since  March 2021, after undergoing fem-fem bypass. No reported bleeding.   aPTT came back therapeutic at 87 tonight. No s/sx of bleeding or infusion issues.   Goal of Therapy:  Heparin level 0.3-0.7 units/ml  APTT 66-102  Monitor platelets by anticoagulation protocol: Yes   Plan:  Continue IV heparin to 1100 units/hr. F/u aPTT until correlates with heparin level  Monitor daily aPTT/HL, CBC/plt Monitor for signs/symptoms of bleeding   Antonietta Jewel, PharmD, BCCCP Clinical Pharmacist  01/24/2020 12:34 AM  Please check AMION for all [place] phone numbers After 10:00 PM, call [place] [phone]

## 2020-01-24 NOTE — Progress Notes (Signed)
Procedure was canceled today after discussions with Dr. Ola Spurr and Dr. Haroldine Laws.  This was due to concerns of her medical issues.  The patient will be kept in the hospital over the weekend for further optimization of his heart failure and we will consider revascularization next week depending on how he does.  Dr. Donzetta Matters will be available this weekend if needed.  Annamarie Major

## 2020-01-24 NOTE — Progress Notes (Signed)
ANTICOAGULATION CONSULT NOTE  Pharmacy Consult for Heparin Indication: atrial fibrillation  Patient Measurements: Height: 6\' 3"  (190.5 cm) Weight: 72.4 kg (159 lb 9.6 oz) IBW/kg (Calculated) : 84.5 Heparin Dosing Weight: 73.5kg  Vital Signs: Temp: 98.1 F (36.7 C) (05/21 1247) Temp Source: Oral (05/21 1247) BP: 113/63 (05/21 1247) Pulse Rate: 108 (05/21 1247)  Labs: Recent Labs    01/22/20 1850 01/22/20 1850 01/23/20 0343 01/23/20 1155 01/23/20 1408 01/23/20 2223 01/23/20 2359 01/24/20 0340 01/24/20 1043 01/24/20 1155  HGB 10.2*  --   --   --   --   --  10.2*  --   --   --   HCT 29.8*  --   --   --   --   --  30.1*  --   --   --   PLT 248  --   --   --   --   --  254  --   --   --   APTT  --   --  83*   < >  --  87* 92* 82*  --   --   LABPROT  --   --   --   --   --   --  21.0*  --   --   --   INR  --   --   --   --   --   --  1.9*  --   --   --   HEPARINUNFRC  --   --  0.92*  --   --   --   --  0.69 0.41  --   CREATININE 2.66*   < > 2.54*   < >   < >  --  2.71* 2.64*  --  2.55*   < > = values in this interval not displayed.    Estimated Creatinine Clearance: 25.2 mL/min (A) (by C-G formula based on SCr of 2.55 mg/dL (H)).  Medical History: Past Medical History:  Diagnosis Date  . AAA (abdominal aortic aneurysm) ([place])    a. 08/2015 Abd U/S: 2.7 x 2.9 x 3.2 cm infrarenal AAA.  . Bradycardia    a. Requiring discontinuation of use of BB/CCB  . Cardiac arrest - ventricular fibrillation    a. 03/2012 in setting of hypokalemia (prolonged hosp with VDRF, tracheobronchitis, ARF, shock liver, PAF, AMS felt secondary to post-anoxic encephalopathy/shock).  . Carotid artery stenosis    a. 01/2011 - 40-59% bilateral stenosis;  b. 06/2015 Carotid U/S: 40-59% bilat ICA stenosis->f/u 6 mos.  . CHF (congestive heart failure) ([place])   . CKD (chronic kidney disease), stage III   . Coronary artery disease    a. s/p aborted ant STEMI tx with Cypher DES to LAD 10/04 (residual at  cath: D1 50%, CFX 40% and multiple dist 70%, EF 55%);   b. myoview 3/10: Ef 47%, infero-apical isch, LOW RISK - med Tx recommended;  c. 12/2012 VF Arrest/Cath: LAD 40isr, 80 apical, LCX 100 (failed PTCA), RCA nondom.  . Diabetes mellitus   . Diverticulosis 2001  . Elevated LFTs    Shock liver 03/2012  . H/O: CVA (cardiovascular accident)   . Hematemesis    a. 12/2010 felt 2/2 Mallory Weiss tear - pt could not afford colonscopy/EGD at that time so Coumadin was deferred. Coumadin initiated 03/2012 without any evidence for bleeding.  Marland Kitchen History of Ischemic Cardiomyopathy    a. EF 50-55% by echo 03/09/12 (was 30% by echo 02/24/12); b. 03/2013 Echo: EF  55-60%, mild MR, sev dil LA, mod dil RA, PASP 14mmHg.  Marland Kitchen Hypertensive heart disease    a. echo 4/12: EF 50%, asymmetric septal hypertrophy, no SAM or LVOT gradient, LAE, PASP 35  . Inguinal hernia   . 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 Iron deficiency anemia   . Mitral regurgitation    a. Mild by echo 03/2012 and 03/2013.  . Mixed Hyperlipidemia   . Peripheral vascular disease ([place])    a. h/o LE angioplasty;  b. 08/2015 Duplex: >50% RCIA, 100% REIA, >50% LEIA, elev vel in SMA, patent IVC.  Marland Kitchen Persistent atrial fibrillation ([place])    a. Dx 12/2010-->coumadin (CHA2DS2VASc = 7).  . QT prolongation   . Tubular adenoma of colon 11/2019   Assessment: [name] starting on heparin for afib (CHADS2VASc = 6). PTA Apixaban on hold for planned vascular surgery 5/21. Last dose 5/19 AM.  Patient has been more anemic (Hgb < 11) since March 2021, after undergoing fem-fem bypass. APTT and heparin levels now appear to be correlating.  S/p RHC on 5/21, marked volume overload with improved cardiac output on milrinone. Plan is to continue milrinone and diuresis. Pharmacy consulted to resume heparin 8 hours post-sheath removal. Sheath removed at 1656 per cath procedure log. CBC stable. No active bleed issues reported.  Goal of Therapy:   Heparin level 0.3-0.7 units/ml  Monitor platelets by anticoagulation protocol: Yes   Plan:  Resume IV heparin at previous rate 1100 units/hr on 5/22 at 0100 (8 hrs post-sheath removal) Heparin level/aPTT 8 hrs post-resumption Monitor daily heparin level and CBC, s/sx bleeding F/u plans to switch back to oral anticoagulation eventually.   Arturo Morton, PharmD, BCPS Please check AMION for all [place] contact numbers Clinical Pharmacist 01/24/2020 4:55 PM

## 2020-01-24 NOTE — Interval H&P Note (Signed)
History and Physical Interval Note:  01/24/2020 4:43 PM  Logan White  has presented today for surgery, with the diagnosis of heart failure.  The various methods of treatment have been discussed with the patient and family. After consideration of risks, benefits and other options for treatment, the patient has consented to  Procedure(s): RIGHT HEART CATH (N/A) as a surgical intervention.  The patient's history has been reviewed, patient examined, no change in status, stable for surgery.  I have reviewed the patient's chart and labs.  Questions were answered to the patient's satisfaction.     Yeila Morro

## 2020-01-24 NOTE — Progress Notes (Signed)
ANTICOAGULATION CONSULT NOTE  Pharmacy Consult for Heparin Indication: atrial fibrillation  Patient Measurements: Height: 6\' 3"  (190.5 cm) Weight: 72.4 kg (159 lb 9.6 oz) IBW/kg (Calculated) : 84.5 Heparin Dosing Weight: 73.5kg  Vital Signs: Temp: 98.1 F (36.7 C) (05/21 1247) Temp Source: Oral (05/21 1247) BP: 113/63 (05/21 1247) Pulse Rate: 108 (05/21 1247)  Labs: Recent Labs    01/22/20 1850 01/22/20 1850 01/23/20 0343 01/23/20 1155 01/23/20 1408 01/23/20 2223 01/23/20 2359 01/24/20 0340 01/24/20 1043 01/24/20 1155  HGB 10.2*  --   --   --   --   --  10.2*  --   --   --   HCT 29.8*  --   --   --   --   --  30.1*  --   --   --   PLT 248  --   --   --   --   --  254  --   --   --   APTT  --   --  83*   < >  --  87* 92* 82*  --   --   LABPROT  --   --   --   --   --   --  21.0*  --   --   --   INR  --   --   --   --   --   --  1.9*  --   --   --   HEPARINUNFRC  --   --  0.92*  --   --   --   --  0.69 0.41  --   CREATININE 2.66*   < > 2.54*   < >   < >  --  2.71* 2.64*  --  2.55*   < > = values in this interval not displayed.    Estimated Creatinine Clearance: 25.2 mL/min (A) (by C-G formula based on SCr of 2.55 mg/dL (H)).  Medical History: Past Medical History:  Diagnosis Date  . AAA (abdominal aortic aneurysm) ([place])    a. 08/2015 Abd U/S: 2.7 x 2.9 x 3.2 cm infrarenal AAA.  . Bradycardia    a. Requiring discontinuation of use of BB/CCB  . Cardiac arrest - ventricular fibrillation    a. 03/2012 in setting of hypokalemia (prolonged hosp with VDRF, tracheobronchitis, ARF, shock liver, PAF, AMS felt secondary to post-anoxic encephalopathy/shock).  . Carotid artery stenosis    a. 01/2011 - 40-59% bilateral stenosis;  b. 06/2015 Carotid U/S: 40-59% bilat ICA stenosis->f/u 6 mos.  . CHF (congestive heart failure) ([place])   . CKD (chronic kidney disease), stage III   . Coronary artery disease    a. s/p aborted ant STEMI tx with Cypher DES to LAD 10/04 (residual at  cath: D1 50%, CFX 40% and multiple dist 70%, EF 55%);   b. myoview 3/10: Ef 47%, infero-apical isch, LOW RISK - med Tx recommended;  c. 12/2012 VF Arrest/Cath: LAD 40isr, 80 apical, LCX 100 (failed PTCA), RCA nondom.  . Diabetes mellitus   . Diverticulosis 2001  . Elevated LFTs    Shock liver 03/2012  . H/O: CVA (cardiovascular accident)   . Hematemesis    a. 12/2010 felt 2/2 Mallory Weiss tear - pt could not afford colonscopy/EGD at that time so Coumadin was deferred. Coumadin initiated 03/2012 without any evidence for bleeding.  Marland Kitchen History of Ischemic Cardiomyopathy    a. EF 50-55% by echo 03/09/12 (was 30% by echo 02/24/12); b. 03/2013 Echo: EF  55-60%, mild MR, sev dil LA, mod dil RA, PASP 71mmHg.  Marland Kitchen Hypertensive heart disease    a. echo 4/12: EF 50%, asymmetric septal hypertrophy, no SAM or LVOT gradient, LAE, PASP 35  . Inguinal hernia   . 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 Iron deficiency anemia   . Mitral regurgitation    a. Mild by echo 03/2012 and 03/2013.  . Mixed Hyperlipidemia   . Peripheral vascular disease ([place])    a. h/o LE angioplasty;  b. 08/2015 Duplex: >50% RCIA, 100% REIA, >50% LEIA, elev vel in SMA, patent IVC.  Marland Kitchen Persistent atrial fibrillation ([place])    a. Dx 12/2010-->coumadin (CHA2DS2VASc = 7).  . QT prolongation   . Tubular adenoma of colon 11/2019   Assessment: [name] starting on heparin for afib (CHADS2VASc = 6). PTA Apixaban on hold for planned vascular surgery 5/21. Last dose 5/19 AM.  Patient has been more anemic (Hgb < 11) since March 2021, after undergoing fem-fem bypass. No reported bleeding.   APTT and heparin levels now appear to be correlating.  No overt bleeding or complications noted.  Goal of Therapy:  Heparin level 0.3-0.7 units/ml  APTT 66-102  Monitor platelets by anticoagulation protocol: Yes   Plan:  Continue IV heparin to 1100 units/hr. Daily heparin level/CBC. F/u plans to switch back to oral  anticoagulation eventually.  Marguerite Olea, [facility] Clinical Pharmacist Phone [phone]  01/24/2020 2:04 PM   Please check AMION for all [place] phone numbers After 10:00 PM, call [place] [phone]

## 2020-01-24 NOTE — Progress Notes (Signed)
ANTICOAGULATION CONSULT NOTE  Pharmacy Consult for Heparin Indication: atrial fibrillation  Patient Measurements: Height: 6\' 3"  (190.5 cm) Weight: 72.4 kg (159 lb 9.6 oz) IBW/kg (Calculated) : 84.5 Heparin Dosing Weight: 73.5kg  Vital Signs: Temp: 98.4 F (36.9 C) (05/21 1800) Temp Source: Oral (05/21 1247) BP: 130/61 (05/21 1800) Pulse Rate: 59 (05/21 1800)  Labs: Recent Labs    01/22/20 1850 01/22/20 1850 01/23/20 0343 01/23/20 1155 01/23/20 2223 01/23/20 2359 01/23/20 2359 01/24/20 0340 01/24/20 1043 01/24/20 1155 01/24/20 1624 01/24/20 1626  HGB 10.2*   < >  --   --   --  10.2*   < >  --   --   --  11.6* 11.2*  HCT 29.8*   < >  --   --   --  30.1*  --   --   --   --  34.0* 33.0*  PLT 248  --   --   --   --  254  --   --   --   --   --   --   APTT  --   --  83*   < > 87* 92*  --  82*  --   --   --   --   LABPROT  --   --   --   --   --  21.0*  --   --   --   --   --   --   INR  --   --   --   --   --  1.9*  --   --   --   --   --   --   HEPARINUNFRC  --   --  0.92*  --   --   --   --  0.69 0.41  --   --   --   CREATININE 2.66*   < > 2.54*   < >  --  2.71*  --  2.64*  --  2.55*  --   --    < > = values in this interval not displayed.    Estimated Creatinine Clearance: 25.2 mL/min (A) (by C-G formula based on SCr of 2.55 mg/dL (H)).  Medical History: Past Medical History:  Diagnosis Date  . AAA (abdominal aortic aneurysm) ([place])    a. 08/2015 Abd U/S: 2.7 x 2.9 x 3.2 cm infrarenal AAA.  . Bradycardia    a. Requiring discontinuation of use of BB/CCB  . Cardiac arrest - ventricular fibrillation    a. 03/2012 in setting of hypokalemia (prolonged hosp with VDRF, tracheobronchitis, ARF, shock liver, PAF, AMS felt secondary to post-anoxic encephalopathy/shock).  . Carotid artery stenosis    a. 01/2011 - 40-59% bilateral stenosis;  b. 06/2015 Carotid U/S: 40-59% bilat ICA stenosis->f/u 6 mos.  . CHF (congestive heart failure) ([place])   . CKD (chronic kidney disease),  stage III   . Coronary artery disease    a. s/p aborted ant STEMI tx with Cypher DES to LAD 10/04 (residual at cath: D1 50%, CFX 40% and multiple dist 70%, EF 55%);   b. myoview 3/10: Ef 47%, infero-apical isch, LOW RISK - med Tx recommended;  c. 12/2012 VF Arrest/Cath: LAD 40isr, 80 apical, LCX 100 (failed PTCA), RCA nondom.  . Diabetes mellitus   . Diverticulosis 2001  . Elevated LFTs    Shock liver 03/2012  . H/O: CVA (cardiovascular accident)   . Hematemesis    a. 12/2010 felt 2/2 Mack Guise  tear - pt could not afford colonscopy/EGD at that time so Coumadin was deferred. Coumadin initiated 03/2012 without any evidence for bleeding.  Marland Kitchen History of Ischemic Cardiomyopathy    a. EF 50-55% by echo 03/09/12 (was 30% by echo 02/24/12); b. 03/2013 Echo: EF 55-60%, mild MR, sev dil LA, mod dil RA, PASP 47mmHg.  Marland Kitchen Hypertensive heart disease    a. echo 4/12: EF 50%, asymmetric septal hypertrophy, no SAM or LVOT gradient, LAE, PASP 35  . Inguinal hernia   . 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 Iron deficiency anemia   . Mitral regurgitation    a. Mild by echo 03/2012 and 03/2013.  . Mixed Hyperlipidemia   . Peripheral vascular disease ([place])    a. h/o LE angioplasty;  b. 08/2015 Duplex: >50% RCIA, 100% REIA, >50% LEIA, elev vel in SMA, patent IVC.  Marland Kitchen Persistent atrial fibrillation ([place])    a. Dx 12/2010-->coumadin (CHA2DS2VASc = 7).  . QT prolongation   . Tubular adenoma of colon 11/2019   Assessment: [name] starting on heparin for afib (CHADS2VASc = 6). PTA Apixaban on hold for planned vascular surgery 5/21. Last dose 5/19 AM.  Patient has been more anemic (Hgb < 11) since March 2021, after undergoing fem-fem bypass. APTT and heparin levels now appear to be correlating.  S/p RHC on 5/21, marked volume overload with improved cardiac output on milrinone. Plan is to continue milrinone and diuresis. Pharmacy consulted to resume heparin 8 hours post-sheath removal.  Sheath removed at 1656 per cath procedure log. CBC stable. No active bleed issues reported.  Goal of Therapy:  Heparin level 0.3-0.7 units/ml  Monitor platelets by anticoagulation protocol: Yes   Plan:  Resume IV heparin at previous rate 1100 units/hr on 5/22 at 0100 (8 hrs post-sheath removal) Heparin level/aPTT 8 hrs post-resumption Monitor daily heparin level and CBC, s/sx bleeding F/u plans to switch back to oral anticoagulation eventually.   Arturo Morton, PharmD, BCPS Please check AMION for all [place] contact numbers Clinical Pharmacist 01/24/2020 7:19 PM   PM discussed with MD - restart heparin 4hr post sheath removal - order adjusted  Bonnita Nasuti Pharm.D. CPP, BCPS Clinical Pharmacist [phone] 01/24/2020 7:19 PM

## 2020-01-24 NOTE — Progress Notes (Signed)
Report given to receiving RN.

## 2020-01-24 NOTE — Progress Notes (Signed)
Surgery cancelled at this time. Report called to floor RN. Verbalized understanding. Pt transported back to floor.

## 2020-01-24 NOTE — Progress Notes (Addendum)
Advanced Heart Failure Rounding Note  PCP-Cardiologist: Kirk Ruths, MD   Subjective:    Yesterday milrinone was cut back to 0.125 mcg. Sluggish diuresis with high dose IV lasix.   SOB with minimal exertion.   Objective:   Weight Range: 72.4 kg Body mass index is 19.95 kg/m.   Vital Signs:   Temp:  [97.9 F (36.6 C)-98.7 F (37.1 C)] 97.9 F (36.6 C) (05/21 0526) Pulse Rate:  [94-103] 99 (05/21 0526) Resp:  [15-17] 15 (05/21 0526) BP: (91-106)/(64-71) 99/68 (05/21 0945) SpO2:  [97 %-99 %] 98 % (05/21 0526) Weight:  [72.4 kg] 72.4 kg (05/21 0849)    Weight change: Filed Weights   01/23/20 0449 01/24/20 0526 01/24/20 0849  Weight: 72.9 kg 72.4 kg 72.4 kg    Intake/Output:   Intake/Output Summary (Last 24 hours) at 01/24/2020 1115 Last data filed at 01/24/2020 0724 Gross per 24 hour  Intake 610.06 ml  Output 900 ml  Net -289.94 ml      Physical Exam    General:  Dyspneic with minimal exertion.  HEENT: normal Neck: supple. JVP to jaw . Carotids 2+ bilat; no bruits. No lymphadenopathy or thryomegaly appreciated. Cor: PMI nondisplaced. Irregular rate & rhythm. No rubs, gallops or murmurs. Lungs: Rhonchi with EW on 2 liters.  Abdomen: soft, nontender, nondistended. No hepatosplenomegaly. No bruits or masses. Good bowel sounds. Extremities: Cool, no cyanosis, clubbing, rash, R and LLE 1+  edema Neuro: alert & orientedx3, cranial nerves grossly intact. moves all 4 extremities w/o difficulty. Affect flat    Telemetry   A FIb 90-100s    Labs    CBC Recent Labs    01/22/20 1850 01/23/20 2359  WBC 9.1 10.6*  NEUTROABS 7.0  --   HGB 10.2* 10.2*  HCT 29.8* 30.1*  MCV 74.5* 75.4*  PLT 248 433   Basic Metabolic Panel Recent Labs    01/23/20 2359 01/24/20 0340  NA 123* 123*  K 5.1 4.7  CL 89* 88*  CO2 24 20*  GLUCOSE 117* 97  BUN 44* 45*  CREATININE 2.71* 2.64*  CALCIUM 8.2* 8.2*   Liver Function Tests Recent Labs    01/22/20 1850  01/23/20 2359  AST 30 25  ALT 19 16  ALKPHOS 213* 188*  BILITOT 2.0* 1.8*  PROT 7.3 7.0  ALBUMIN 2.8* 2.5*   No results for input(s): LIPASE, AMYLASE in the last 72 hours. Cardiac Enzymes No results for input(s): CKTOTAL, CKMB, CKMBINDEX, TROPONINI in the last 72 hours.  BNP: BNP (last 3 results) Recent Labs    11/25/19 1004 12/26/19 1515 01/22/20 1850  BNP 2,757.9* 1,843.7* 2,557.6*    ProBNP (last 3 results) Recent Labs    08/15/19 0949  PROBNP 16,511*     D-Dimer No results for input(s): DDIMER in the last 72 hours. Hemoglobin A1C No results for input(s): HGBA1C in the last 72 hours. Fasting Lipid Panel No results for input(s): CHOL, HDL, LDLCALC, TRIG, CHOLHDL, LDLDIRECT in the last 72 hours. Thyroid Function Tests No results for input(s): TSH, T4TOTAL, T3FREE, THYROIDAB in the last 72 hours.  Invalid input(s): FREET3  Other results:   Imaging    No results found.   Medications:     Scheduled Medications: . atorvastatin  80 mg Oral Daily  . Chlorhexidine Gluconate Cloth  6 each Topical Daily  . feeding supplement (ENSURE ENLIVE)  237 mL Oral Daily  . ferrous sulfate  325 mg Oral BID WC  . gabapentin  300 mg Oral  QHS  . hydrALAZINE  10 mg Oral BID  . isosorbide mononitrate  30 mg Oral Daily  . metoprolol succinate  12.5 mg Oral QHS  . mupirocin ointment  1 application Nasal BID  . pantoprazole  40 mg Oral BID  . sodium chloride flush  3 mL Intravenous Q12H  . sodium chloride flush  3 mL Intravenous Q12H  . [START ON 01/25/2020] spironolactone  25 mg Oral Daily    Infusions: . sodium chloride    . heparin 1,100 Units/hr (01/24/20 0604)  . milrinone 0.125 mcg/kg/min (01/24/20 0604)    PRN Medications: sodium chloride, acetaminophen, albuterol, ondansetron (ZOFRAN) IV, sodium chloride flush, zolpidem    Assessment/Plan   1. PAD w/ Claudication  - knownR external iliac artery occlusion with severe infrainguinal arterial occlusive  disease and peroneal runoff only. L SFA occlusion with diffuse infrainguinal arterial occlusive disease - Dr. Trula Slade plans on a left iliac stent, bilateral femoral endarterectomies, and a left to right femorofemoral bypass on 5/21  2. Chronic Biventricular CHF  - known ICM dating back to 2013.  - Echo 1/21showed LVEF 25-30%, global hypokinesis, mild LVH, RV mildly enlarged w/ severely reduced systolic function. Moderate MR. Mild TR. Severe bi atrial enlargement. - RHC on 3/25 showed elevated biventricular filling pressures and with equalization of R &L sided diastolic pressuresand preserved CO consistent withrestrictive physiology. - NYHA III but more so limited by leg claudication than exertional dyspnea.  -  Recent d/c wt was 153 lb. - Continue milrinone 0.125 mcg.   - Poor response to IV lasix. Given dose of 160 mg IV x1.  -  Continue 12.5 mg of Toprol XL at bed time. May toprol xl after RHC.  - Continue spiro 25 mg daily.  - Continue hydralazine 10 mg bid - Continue Imdur 30 mg qd   3. CKD IIIb/IV:  - Creatinine baseline ~2.5 - Trend 2.66>>2.54 >>2.64  - Monitor   4 CAD:  - prior LAD and LCx stenting - denies anginal symptoms  - no ASA due to apixaban - continue high dose statin   6.Chronic AF - Rate increased with addition of milrinone.  - Continue metoprolol for rate control, monitor on tele  - now on  IV heparin per pharmacy   7. Anemia , Iron Deficient  - Received Feraheme 3/29. - continue PO iron - Hgb 10.2 (up from 8.8 last month)  8. Hyponatremia - Na 121> 123   - suspect hypervolemic hyponatremia - Give another dose of tolvaptan. 15 mg x1.    9. COPD -EW noted. On albuterol. May need steroids. - WBC 10.6 on 5/20. CHeck CBC now.  - CXR now.    Plan for [place] with swan today. Move to ICU.  Length of Stay: 2  Logan Grinder, Logan White  01/24/2020, 11:15 AM  Advanced Heart Failure Team Pager [phone] (M-F; [place])  Please contact [facility] for  night-coverage after hours (4p -7a ) and weekends on [email or web address]  Agree with above. He is much worse today. More confused and SOB. Not diuresing well.   General:  Confused. + increased WOB HEENT: normal Neck: supple. JVP to jaw Carotids 2+ bilat; no bruits. No lymphadenopathy or thryomegaly appreciated. Cor: PMI nondisplaced.Irr + s3 Lungs: rhonchi Abdomen: soft, nontender, nondistended. No hepatosplenomegaly. No bruits or masses. Good bowel sounds. Extremities: no cyanosis, clubbing, rash, 1+ edema + wounds Neuro: confused. Lethargic  He appears to be in cardiogenic shock. Increase milrinone. Plan RHC/swan placement today.   D/w  [place]. Will postpone surgery for today.   CRITICAL CARE Performed by: Glori Bickers  Total critical care time: 35 minutes  Critical care time was exclusive of separately billable procedures and treating other patients.  Critical care was necessary to treat or prevent imminent or life-threatening deterioration.  Critical care was time spent personally by me (independent of midlevel providers or residents) on the following activities: development of treatment plan with patient and/or surrogate as well as nursing, discussions with consultants, evaluation of patient's response to treatment, examination of patient, obtaining history from patient or surrogate, ordering and performing treatments and interventions, ordering and review of laboratory studies, ordering and review of radiographic studies, pulse oximetry and re-evaluation of patient's condition.  Glori Bickers, MD  4:53 PM

## 2020-01-25 LAB — CBC
HCT: 27.1 % — ABNORMAL LOW (ref 39.0–52.0)
Hemoglobin: 9.4 g/dL — ABNORMAL LOW (ref 13.0–17.0)
MCH: 25.3 pg — ABNORMAL LOW (ref 26.0–34.0)
MCHC: 34.7 g/dL (ref 30.0–36.0)
MCV: 73 fL — ABNORMAL LOW (ref 80.0–100.0)
Platelets: 252 10*3/uL (ref 150–400)
RBC: 3.71 MIL/uL — ABNORMAL LOW (ref 4.22–5.81)
RDW: 18.1 % — ABNORMAL HIGH (ref 11.5–15.5)
WBC: 11 10*3/uL — ABNORMAL HIGH (ref 4.0–10.5)
nRBC: 0 % (ref 0.0–0.2)

## 2020-01-25 LAB — TYPE AND SCREEN
ABO/RH(D): O POS
Antibody Screen: NEGATIVE

## 2020-01-25 LAB — BASIC METABOLIC PANEL
Anion gap: 10 (ref 5–15)
BUN: 38 mg/dL — ABNORMAL HIGH (ref 8–23)
CO2: 26 mmol/L (ref 22–32)
Calcium: 8.1 mg/dL — ABNORMAL LOW (ref 8.9–10.3)
Chloride: 97 mmol/L — ABNORMAL LOW (ref 98–111)
Creatinine, Ser: 2.27 mg/dL — ABNORMAL HIGH (ref 0.61–1.24)
GFR calc Af Amer: 31 mL/min — ABNORMAL LOW (ref >60)
GFR calc non Af Amer: 27 mL/min — ABNORMAL LOW (ref >60)
Glucose, Bld: 116 mg/dL — ABNORMAL HIGH (ref 70–99)
Potassium: 3.5 mmol/L (ref 3.5–5.1)
Sodium: 133 mmol/L — ABNORMAL LOW (ref 135–145)

## 2020-01-25 LAB — COOXEMETRY PANEL
Carboxyhemoglobin: 1.9 % — ABNORMAL HIGH (ref 0.5–1.5)
Carboxyhemoglobin: 1.6 % — ABNORMAL HIGH (ref 0.5–1.5)
Methemoglobin: 1.2 % (ref 0.0–1.5)
Methemoglobin: 0.6 % (ref 0.0–1.5)
O2 Saturation: 63.3 %
O2 Saturation: 98.3 %
Total hemoglobin: 10 g/dL — ABNORMAL LOW (ref 12.0–16.0)
Total hemoglobin: 9.8 g/dL — ABNORMAL LOW (ref 12.0–16.0)

## 2020-01-25 LAB — HEPARIN LEVEL (UNFRACTIONATED): Heparin Unfractionated: 0.33 IU/mL (ref 0.30–0.70)

## 2020-01-25 LAB — APTT: aPTT: 122 seconds — ABNORMAL HIGH (ref 24–36)

## 2020-01-25 MED ORDER — SODIUM CHLORIDE 0.9% FLUSH: 3.0000 mL | Freq: Two times a day (BID) | INTRAVENOUS | Status: DC

## 2020-01-25 MED ORDER — ACETAMINOPHEN 325 MG PO TABS
650.0000 mg | ORAL_TABLET | ORAL | Status: DC | PRN
  Administered 2020-01-25 – 2020-01-26 (×2): 650 mg via ORAL
  Filled 2020-01-25 (×2): qty 2

## 2020-01-25 MED ORDER — ONDANSETRON HCL 4 MG/2ML IJ SOLN
4.0000 mg | Freq: Four times a day (QID) | INTRAMUSCULAR | Status: DC | PRN
Start: 2020-01-25 — End: 2020-01-25

## 2020-01-25 MED ORDER — LABETALOL HCL 5 MG/ML IV SOLN: 10.0000 mg | INTRAVENOUS | Status: AC | PRN

## 2020-01-25 MED ORDER — ORAL CARE MOUTH RINSE
15.0000 mL | Freq: Two times a day (BID) | OROMUCOSAL | Status: DC
  Administered 2020-01-25 – 2020-01-30 (×9): 15 mL via OROMUCOSAL

## 2020-01-25 MED ORDER — HYDRALAZINE HCL 20 MG/ML IJ SOLN: 10.0000 mg | INTRAMUSCULAR | Status: AC | PRN

## 2020-01-25 MED ORDER — TORSEMIDE 20 MG PO TABS
60.0000 mg | ORAL_TABLET | Freq: Every day | ORAL | Status: DC
  Administered 2020-01-25 – 2020-01-27 (×3): 60 mg via ORAL
  Filled 2020-01-25 (×4): qty 3

## 2020-01-25 MED ORDER — SODIUM CHLORIDE 0.9 % IV SOLN: 250.0000 mL | INTRAVENOUS | Status: DC | PRN

## 2020-01-25 MED ORDER — POTASSIUM CHLORIDE CRYS ER 20 MEQ PO TBCR
40.0000 meq | EXTENDED_RELEASE_TABLET | Freq: Once | ORAL | Status: DC
  Filled 2020-01-25: qty 2

## 2020-01-25 MED ORDER — POTASSIUM CHLORIDE 10 MEQ/50ML IV SOLN
10.0000 meq | INTRAVENOUS | Status: AC
  Administered 2020-01-25 (×2): 10 meq via INTRAVENOUS
  Filled 2020-01-25 (×2): qty 50

## 2020-01-25 MED ORDER — SODIUM CHLORIDE 0.9% FLUSH: 3.0000 mL | INTRAVENOUS | Status: DC | PRN

## 2020-01-25 NOTE — Progress Notes (Signed)
Called for IV consult regarding infiltration from earlier today with millrinone infusing.  Upon assessment the PIV is out.  Skin is pink, tender to touch.  Recommend ice, elevation, and for RN to call pharmacy for any further treatment.

## 2020-01-25 NOTE — Progress Notes (Signed)
  Progress Note    01/25/2020 9:57 AM 1 Day Post-Op  Subjective: Denies any new issues  Vitals:   01/25/20 0748 01/25/20 0800  BP:  118/82  Pulse: (!) 112 (!) 104  Resp: (!) 40 18  Temp: 99.3 F (37.4 C) 99.3 F (37.4 C)  SpO2: (!) 89% 99%    Physical Exam: Awake alert and oriented this morning Bilateral feet with stable wounds  CBC    Component Value Date/Time   WBC 11.0 (H) 01/25/2020 0432   RBC 3.71 (L) 01/25/2020 0432   HGB 9.4 (L) 01/25/2020 0432   HGB 12.7 (L) 07/29/2019 1004   HCT 27.1 (L) 01/25/2020 0432   HCT 38.3 07/29/2019 1004   PLT 252 01/25/2020 0432   PLT 191 07/29/2019 1004   MCV 73.0 (L) 01/25/2020 0432   MCV 80 07/29/2019 1004   MCH 25.3 (L) 01/25/2020 0432   MCHC 34.7 01/25/2020 0432   RDW 18.1 (H) 01/25/2020 0432   RDW 15.6 (H) 07/29/2019 1004   LYMPHSABS 0.9 01/22/2020 1850   MONOABS 1.0 01/22/2020 1850   EOSABS 0.1 01/22/2020 1850   BASOSABS 0.0 01/22/2020 1850    BMET    Component Value Date/Time   NA 133 (L) 01/25/2020 0432   NA 132 (L) 12/10/2019 1409   K 3.5 01/25/2020 0432   CL 97 (L) 01/25/2020 0432   CO2 26 01/25/2020 0432   GLUCOSE 116 (H) 01/25/2020 0432   BUN 38 (H) 01/25/2020 0432   BUN 63 (H) 12/10/2019 1409   CREATININE 2.27 (H) 01/25/2020 0432   CREATININE 1.34 (H) 10/31/2016 0844   CALCIUM 8.1 (L) 01/25/2020 0432   GFRNONAA 27 (L) 01/25/2020 0432   GFRNONAA 46 (L) 10/24/2014 0943   GFRAA 31 (L) 01/25/2020 0432   GFRAA 53 (L) 10/24/2014 0943    INR    Component Value Date/Time   INR 1.9 (H) 01/23/2020 2359     Intake/Output Summary (Last 24 hours) at 01/25/2020 0957 Last data filed at 01/25/2020 0800 Gross per 24 hour  Intake 346.22 ml  Output 3945 ml  Net -3598.78 ml     Assessment/plan:  79 y.o. male is here for left common iliac artery stent with left to right femorofemoral bypass which was canceled due to patient's high risk from heart failure.  Appreciate Dr. Haroldine Laws intervention and treatment  of this patient.  We will plan for above-noted procedure likely early next week.   Anndee Connett C. Donzetta Matters, MD [facility] Office: [phone] Pager: [phone]  01/25/2020 9:57 AM

## 2020-01-25 NOTE — Progress Notes (Signed)
Pt has been EXTREMELY restless all day, twisting and turning in his bed. It has been challenging keeping his swan line and iv lines untangled . While bathing patient I noticed his right upper forearm iv was pulled out and his arm was slightly swollen and tender to touch. Elevated extremity, pharmacy and iv team called. Ice applied for comfort. Dr. Haroldine Laws aware, monitor. Ellamae Sia

## 2020-01-25 NOTE — Progress Notes (Signed)
ANTICOAGULATION CONSULT NOTE  Pharmacy Consult for Heparin Indication: atrial fibrillation  Patient Measurements: Height: 6\' 3"  (190.5 cm) Weight: 68.7 kg (151 lb 7.3 oz) IBW/kg (Calculated) : 84.5 Heparin Dosing Weight: 73.5kg  Vital Signs: Temp: 99.3 F (37.4 C) (05/22 1000) BP: 119/74 (05/22 1000) Pulse Rate: 83 (05/22 1000)  Labs: Recent Labs    01/22/20 1850 01/23/20 0343 01/23/20 2359 01/23/20 2359 01/24/20 0340 01/24/20 1043 01/24/20 1155 01/24/20 1624 01/24/20 1624 01/24/20 1626 01/25/20 0432 01/25/20 0952  HGB 10.2*  --  10.2*   < >  --   --   --  11.6*   < > 11.2* 9.4*  --   HCT 29.8*  --  30.1*   < >  --   --   --  34.0*  --  33.0* 27.1*  --   PLT 248  --  254  --   --   --   --   --   --   --  252  --   APTT  --    < > 92*  --  82*  --   --   --   --   --   --  122*  LABPROT  --   --  21.0*  --   --   --   --   --   --   --   --   --   INR  --   --  1.9*  --   --   --   --   --   --   --   --   --   HEPARINUNFRC  --    < >  --   --  0.69 0.41  --   --   --   --   --  0.33  CREATININE 2.66*   < > 2.71*  --  2.64*  --  2.55*  --   --   --  2.27*  --    < > = values in this interval not displayed.    Estimated Creatinine Clearance: 26.9 mL/min (A) (by C-G formula based on SCr of 2.27 mg/dL (H)).  Medical History: Past Medical History:  Diagnosis Date  . AAA (abdominal aortic aneurysm) ([place])    a. 08/2015 Abd U/S: 2.7 x 2.9 x 3.2 cm infrarenal AAA.  . Bradycardia    a. Requiring discontinuation of use of BB/CCB  . Cardiac arrest - ventricular fibrillation    a. 03/2012 in setting of hypokalemia (prolonged hosp with VDRF, tracheobronchitis, ARF, shock liver, PAF, AMS felt secondary to post-anoxic encephalopathy/shock).  . Carotid artery stenosis    a. 01/2011 - 40-59% bilateral stenosis;  b. 06/2015 Carotid U/S: 40-59% bilat ICA stenosis->f/u 6 mos.  . CHF (congestive heart failure) ([place])   . CKD (chronic kidney disease), stage III   . Coronary artery  disease    a. s/p aborted ant STEMI tx with Cypher DES to LAD 10/04 (residual at cath: D1 50%, CFX 40% and multiple dist 70%, EF 55%);   b. myoview 3/10: Ef 47%, infero-apical isch, LOW RISK - med Tx recommended;  c. 12/2012 VF Arrest/Cath: LAD 40isr, 80 apical, LCX 100 (failed PTCA), RCA nondom.  . Diabetes mellitus   . Diverticulosis 2001  . Elevated LFTs    Shock liver 03/2012  . H/O: CVA (cardiovascular accident)   . Hematemesis    a. 12/2010 felt 2/2 Mallory Weiss tear - pt could not afford colonscopy/EGD at that  time so Coumadin was deferred. Coumadin initiated 03/2012 without any evidence for bleeding.  Marland Kitchen History of Ischemic Cardiomyopathy    a. EF 50-55% by echo 03/09/12 (was 30% by echo 02/24/12); b. 03/2013 Echo: EF 55-60%, mild MR, sev dil LA, mod dil RA, PASP 86mmHg.  Marland Kitchen Hypertensive heart disease    a. echo 4/12: EF 50%, asymmetric septal hypertrophy, no SAM or LVOT gradient, LAE, PASP 35  . Inguinal hernia   . 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 Iron deficiency anemia   . Mitral regurgitation    a. Mild by echo 03/2012 and 03/2013.  . Mixed Hyperlipidemia   . Peripheral vascular disease ([place])    a. h/o LE angioplasty;  b. 08/2015 Duplex: >50% RCIA, 100% REIA, >50% LEIA, elev vel in SMA, patent IVC.  Marland Kitchen Persistent atrial fibrillation ([place])    a. Dx 12/2010-->coumadin (CHA2DS2VASc = 7).  . QT prolongation   . Tubular adenoma of colon 11/2019   Assessment: [name] starting on heparin for afib (CHADS2VASc = 6). PTA Apixaban on hold for planned vascular surgery 5/21. Last dose 5/19 AM.  Patient has been more anemic (Hgb < 11) since March 2021, after undergoing fem-fem bypass.  S/p RHC on 5/21, marked volume overload with improved cardiac output on milrinone. Plan is to continue milrinone and diuresis. Pharmacy consulted to resume heparin 4 hours post-sheath removal. Sheath removed at 1656 per cath procedure log.  Heparin level is therapeutic at 0.33.  aPTT is elevated but given that the heparin level itself is much more reliable and reflective of heparin dosing (aPTTs can be erratic due to multiple factors), will dose solely off heparin levels now and will stop checking aPTTs.  CBC stable. No active bleed issues reported.  Goal of Therapy:  Heparin level 0.3-0.7 units/ml  Monitor platelets by anticoagulation protocol: Yes   Plan:  Continue IV heparin at 1100 units/hr Monitor daily heparin level and CBC, s/sx bleeding F/u plans to switch back to oral anticoagulation eventually.  Vertis Kelch, PharmD, BCPS PGY2 Cardiology Pharmacy Resident Phone [phone] 01/25/2020       11:51 AM  Please check [email or web address] for unit-specific pharmacist phone numbers

## 2020-01-25 NOTE — Progress Notes (Addendum)
Advanced Heart Failure Rounding Note  PCP-Cardiologist: Kirk Ruths, MD   Subjective:    Surgery cancelled on 5/20 sue to shock. Initial co-ox 36%.   Now has swan in. On milrinone 0.25. Hemodynamics much improved.   No longer confused. Frustrated about being NPO.   Denies SOB, orthopnea, PND or resting leg pain.   Swan #s CVP 11 PA 50/21 PCWP 15 Thermo 6.1/3.1 Co-ox  63%  Objective:   Weight Range: 68.7 kg Body mass index is 18.93 kg/m.   Vital Signs:   Temp:  [98.1 F (36.7 C)-99.9 F (37.7 C)] 99.3 F (37.4 C) (05/22 0800) Pulse Rate:  [52-135] 104 (05/22 0800) Resp:  [0-114] 18 (05/22 0800) BP: (95-131)/(56-82) 118/82 (05/22 0800) SpO2:  [0 %-100 %] 99 % (05/22 0800) Weight:  [68.7 kg] 68.7 kg (05/22 0500)    Weight change: Filed Weights   01/24/20 0526 01/24/20 0849 01/25/20 0500  Weight: 72.4 kg 72.4 kg 68.7 kg    Intake/Output:   Intake/Output Summary (Last 24 hours) at 01/25/2020 0946 Last data filed at 01/25/2020 0800 Gross per 24 hour  Intake 346.22 ml  Output 3945 ml  Net -3598.78 ml      Physical Exam    General:  Lying flat No resp difficulty HEENT: normal Neck: supple. RIJ swan. Carotids 2+ bilat; no bruits. No lymphadenopathy or thryomegaly appreciated. Cor: PMI nondisplaced. Irregular tachy Lungs: clear Abdomen: soft, nontender, nondistended. No hepatosplenomegaly. No bruits or masses. Good bowel sounds. Extremities: no cyanosis, clubbing, rash, edema no edema. + scars Neuro: alert & orientedx3, cranial nerves grossly intact. moves all 4 extremities w/o difficulty. Affect pleasant   Telemetry   A FIb 100-120s Personally reviewed    Labs    CBC Recent Labs    01/22/20 1850 01/22/20 1850 01/23/20 2359 01/24/20 1624 01/24/20 1626 01/25/20 0432  WBC 9.1   < > 10.6*  --   --  11.0*  NEUTROABS 7.0  --   --   --   --   --   HGB 10.2*   < > 10.2*   < > 11.2* 9.4*  HCT 29.8*   < > 30.1*   < > 33.0* 27.1*  MCV 74.5*    < > 75.4*  --   --  73.0*  PLT 248   < > 254  --   --  252   < > = values in this interval not displayed.   Basic Metabolic Panel Recent Labs    01/24/20 1155 01/24/20 1624 01/24/20 1626 01/24/20 1626 01/24/20 1755 01/25/20 0432  NA 126*   < > 129*   < > 129* 133*  K 4.5   < > 4.1  --   --  3.5  CL 90*  --   --   --   --  97*  CO2 21*  --   --   --   --  26  GLUCOSE 192*  --   --   --   --  116*  BUN 46*  --   --   --   --  38*  CREATININE 2.55*  --   --   --   --  2.27*  CALCIUM 8.4*  --   --   --   --  8.1*   < > = values in this interval not displayed.   Liver Function Tests Recent Labs    01/22/20 1850 01/23/20 2359  AST 30 25  ALT 19 16  ALKPHOS 213* 188*  BILITOT 2.0* 1.8*  PROT 7.3 7.0  ALBUMIN 2.8* 2.5*   No results for input(s): LIPASE, AMYLASE in the last 72 hours. Cardiac Enzymes No results for input(s): CKTOTAL, CKMB, CKMBINDEX, TROPONINI in the last 72 hours.  BNP: BNP (last 3 results) Recent Labs    11/25/19 1004 12/26/19 1515 01/22/20 1850  BNP 2,757.9* 1,843.7* 2,557.6*    ProBNP (last 3 results) Recent Labs    08/15/19 0949  PROBNP 16,511*     D-Dimer No results for input(s): DDIMER in the last 72 hours. Hemoglobin A1C No results for input(s): HGBA1C in the last 72 hours. Fasting Lipid Panel No results for input(s): CHOL, HDL, LDLCALC, TRIG, CHOLHDL, LDLDIRECT in the last 72 hours. Thyroid Function Tests No results for input(s): TSH, T4TOTAL, T3FREE, THYROIDAB in the last 72 hours.  Invalid input(s): FREET3  Other results:   Imaging    CARDIAC CATHETERIZATION  Result Date: 01/24/2020 Findings: On milrinone 0.25 RA = 16 RV = 59/17 PA = 58/30 (43) PCW = 28 Fick cardiac output/index = 4.9/2.5 PVR = 3.0 WU FA sat = 96% PA sat = 57%, 58% PaPI = 2.6 Assessment: 1. Marked volume overload with improved cardiac output on milrinone Plan/Discussion: Leave swan in. Continue milrinone and diuresis. Glori Bickers, MD 4:49 PM  DG  CHEST PORT 1 VIEW  Result Date: 01/24/2020 CLINICAL DATA:  Shortness of breath.  Cardiac disease. EXAM: PORTABLE CHEST 1 VIEW COMPARISON:  11/25/2019 FINDINGS: Enlarged cardiac silhouette that could be due to cardiomegaly and/or pericardial fluid. Coronary artery calcification and stents. Aortic atherosclerotic calcification. Pulmonary venous hypertension without frank edema. No infiltrate, effusion or collapse. IMPRESSION: Enlarged cardiac silhouette. Aortic atherosclerosis. Coronary artery calcification. Pulmonary venous hypertension without frank edema. Electronically Signed   By: Nelson Chimes M.D.   On: 01/24/2020 11:53     Medications:     Scheduled Medications: . atorvastatin  80 mg Oral Daily  . Chlorhexidine Gluconate Cloth  6 each Topical Daily  . feeding supplement (ENSURE ENLIVE)  237 mL Oral Daily  . ferrous sulfate  325 mg Oral BID WC  . gabapentin  300 mg Oral QHS  . hydrALAZINE  10 mg Oral BID  . isosorbide mononitrate  30 mg Oral Daily  . mouth rinse  15 mL Mouth Rinse BID  . mupirocin ointment  1 application Nasal BID  . pantoprazole  40 mg Oral BID  . potassium chloride  40 mEq Oral Once  . sodium chloride flush  3 mL Intravenous Q12H  . sodium chloride flush  3 mL Intravenous Q12H  . sodium chloride flush  3 mL Intravenous Q12H  . spironolactone  25 mg Oral Daily    Infusions: . sodium chloride    . sodium chloride    . heparin 1,100 Units/hr (01/25/20 0700)  . milrinone 0.2469 mcg/kg/min (01/25/20 0700)    PRN Medications: sodium chloride, sodium chloride, acetaminophen, albuterol, hydrALAZINE, labetalol, ondansetron (ZOFRAN) IV, sodium chloride flush, sodium chloride flush, zolpidem    Assessment/Plan    1. Acute on chronic Biventricular CHF  -> cardiogenic shock - known ICM dating back to 2013.  - Echo 1/21showed LVEF 25-30%, global hypokinesis, mild LVH, RV mildly enlarged w/ severely reduced systolic function. Moderate MR. Mild TR. Severe bi atrial  enlargement. - RHC on 3/25 showed elevated biventricular filling pressures and with equalization of R &L sided diastolic pressuresand preserved CO consistent withrestrictive physiology. - Developed cardiogenic shock on 5/20 with initial co-ox 36%.  - Hemodynamics  improved on milrinone 0.25. CO now normalized. Volume status looks good.  - Continue milrinone support.  - Continue hydralazine/nitrates & spiro - Start torsemide 60 daily (home dose is 100 bid) - No ARB/ARNI with CKD IV - No b-blocker with shock - Will leave swan in for OR  2 PAD w/ Claudication  - knownR external iliac artery occlusion with severe infrainguinal arterial occlusive disease and peroneal runoff only. L SFA occlusion with diffuse infrainguinal arterial occlusive disease - Dr. Trula Slade plans on a left iliac stent +/- bilateral femoral endarterectomies, and a left to right femorofemoral bypass - D/w Dr. Donzetta Matters at bedside. - This is likely as good as he is going to get from a CV standpoint.  - Can plan OR early next week if VVS and Anesthesia on board  3. CKD IV:  - Creatinine baseline ~2.5 - Trend 2.66>>2.54 >>2.64  -> 2.27 (improved with inotropic support) - Monitor   4 CAD:  - prior LAD and LCx stenting - denies anginal symptoms  - no ASA due to apixaban - continue high dose statin   6.Chronic AF - Rate up with addition of milrinon - Off b-blocker due to shock. Can add low-dose IV amio as needed for rate control while on milirinone - now on  IV heparin per pharmacy   7. Anemia , Iron Deficient  - Received Feraheme 3/29. - continue PO iron - Hgb 10.2 (up from 8.8 last month)  8. Hyponatremia - Na 121> 123  -> 133 - improved with tovlaptan and hemodynamic support  9. COPD -stable  CRITICAL CARE Performed by: Glori Bickers  Total critical care time: 35 minutes  Critical care time was exclusive of separately billable procedures and treating other patients.  Critical care was  necessary to treat or prevent imminent or life-threatening deterioration.  Critical care was time spent personally by me (independent of midlevel providers or residents) on the following activities: development of treatment plan with patient and/or surrogate as well as nursing, discussions with consultants, evaluation of patient's response to treatment, examination of patient, obtaining history from patient or surrogate, ordering and performing treatments and interventions, ordering and review of laboratory studies, ordering and review of radiographic studies, pulse oximetry and re-evaluation of patient's condition.    Length of Stay: 3  Glori Bickers, MD  01/25/2020, 9:46 AM  Advanced Heart Failure Team Pager [phone] (M-F; 7a - 4p)  Please contact [facility] for night-coverage after hours (4p -7a ) and weekends on [email or web address]

## 2020-01-26 ENCOUNTER — Inpatient Hospital Stay (HOSPITAL_COMMUNITY): Payer: Medicare HMO

## 2020-01-26 LAB — BASIC METABOLIC PANEL
Anion gap: 7 (ref 5–15)
BUN: 32 mg/dL — ABNORMAL HIGH (ref 8–23)
CO2: 26 mmol/L (ref 22–32)
Calcium: 8.3 mg/dL — ABNORMAL LOW (ref 8.9–10.3)
Chloride: 97 mmol/L — ABNORMAL LOW (ref 98–111)
Creatinine, Ser: 2.09 mg/dL — ABNORMAL HIGH (ref 0.61–1.24)
GFR calc Af Amer: 35 mL/min — ABNORMAL LOW (ref >60)
GFR calc non Af Amer: 30 mL/min — ABNORMAL LOW (ref >60)
Glucose, Bld: 128 mg/dL — ABNORMAL HIGH (ref 70–99)
Potassium: 3.4 mmol/L — ABNORMAL LOW (ref 3.5–5.1)
Sodium: 130 mmol/L — ABNORMAL LOW (ref 135–145)

## 2020-01-26 LAB — CBC
HCT: 27.4 % — ABNORMAL LOW (ref 39.0–52.0)
Hemoglobin: 9.6 g/dL — ABNORMAL LOW (ref 13.0–17.0)
MCH: 25.9 pg — ABNORMAL LOW (ref 26.0–34.0)
MCHC: 35 g/dL (ref 30.0–36.0)
MCV: 74.1 fL — ABNORMAL LOW (ref 80.0–100.0)
Platelets: 245 10*3/uL (ref 150–400)
RBC: 3.7 MIL/uL — ABNORMAL LOW (ref 4.22–5.81)
RDW: 18.2 % — ABNORMAL HIGH (ref 11.5–15.5)
WBC: 11.2 10*3/uL — ABNORMAL HIGH (ref 4.0–10.5)
nRBC: 0 % (ref 0.0–0.2)

## 2020-01-26 LAB — COOXEMETRY PANEL
Carboxyhemoglobin: 1.3 % (ref 0.5–1.5)
Methemoglobin: 0.7 % (ref 0.0–1.5)
O2 Saturation: 60 %
Total hemoglobin: 9.5 g/dL — ABNORMAL LOW (ref 12.0–16.0)

## 2020-01-26 LAB — EXPECTORATED SPUTUM ASSESSMENT W GRAM STAIN, RFLX TO RESP C

## 2020-01-26 LAB — HEPARIN LEVEL (UNFRACTIONATED): Heparin Unfractionated: 0.57 IU/mL (ref 0.30–0.70)

## 2020-01-26 MED ORDER — SALINE SPRAY 0.65 % NA SOLN
1.0000 | NASAL | Status: DC | PRN
  Filled 2020-01-26: qty 44

## 2020-01-26 MED ORDER — VANCOMYCIN HCL IN DEXTROSE 1-5 GM/200ML-% IV SOLN
1000.0000 mg | INTRAVENOUS | Status: DC
  Administered 2020-01-26 – 2020-01-30 (×6): 1000 mg via INTRAVENOUS
  Filled 2020-01-26 (×5): qty 200

## 2020-01-26 MED ORDER — POTASSIUM CHLORIDE CRYS ER 20 MEQ PO TBCR
40.0000 meq | EXTENDED_RELEASE_TABLET | Freq: Once | ORAL | Status: AC
  Administered 2020-01-26: 40 meq via ORAL
  Filled 2020-01-26: qty 2

## 2020-01-26 MED ORDER — SODIUM CHLORIDE 0.9 % IV SOLN
2.0000 g | Freq: Two times a day (BID) | INTRAVENOUS | Status: DC
  Administered 2020-01-26 – 2020-01-30 (×9): 2 g via INTRAVENOUS
  Filled 2020-01-26 (×11): qty 2

## 2020-01-26 NOTE — Progress Notes (Addendum)
Advanced Heart Failure Rounding Note  PCP-Cardiologist: Kirk Ruths, MD   Subjective:    Surgery cancelled on 5/20 sue to shock. Initial co-ox 36%.   Swan pulled out overnight. Remains on milrinone 0.25. Co-ox 60% Last CO/CI was 5.1/2.6. CVP 10 Denies CP or SOB. No leg pain. Breathing much better.   Coughing up brownish/green sputum. Febrile to 100.4  Creatinine improved  2.7 ->-> 2.09  Objective:   Weight Range: 74.7 kg Body mass index is 20.58 kg/m.   Vital Signs:   Temp:  [97.7 F (36.5 C)-100.4 F (38 C)] 100 F (37.8 C) (05/23 1000) Pulse Rate:  [30-155] 30 (05/23 1000) Resp:  [20-37] 28 (05/23 1000) BP: (86-181)/(51-147) 105/51 (05/23 1000) SpO2:  [87 %-100 %] 100 % (05/23 1000) Weight:  [74.7 kg] 74.7 kg (05/23 0500)    Weight change: Filed Weights   01/24/20 0849 01/25/20 0500 01/26/20 0500  Weight: 72.4 kg 68.7 kg 74.7 kg    Intake/Output:   Intake/Output Summary (Last 24 hours) at 01/26/2020 1141 Last data filed at 01/26/2020 0800 Gross per 24 hour  Intake 2071.96 ml  Output 2800 ml  Net -728.04 ml      Physical Exam    General:  Chronically ill appearing. Lying flat . No resp difficulty HEENT: normal Neck: supple. RIJ introduncer Carotids 2+ bilat; no bruits. No lymphadenopathy or thryomegaly appreciated. Cor: PMI nondisplaced. Irregular rate. Tachy No rubs, gallops or murmurs. Lungs: rhonchorous mild wheeze Abdomen: soft, nontender, nondistended. No hepatosplenomegaly. No bruits or masses. Good bowel sounds. Extremities: no cyanosis, clubbing, rash, edema + healed wounds Neuro: alert & orientedx3, cranial nerves grossly intact. moves all 4 extremities w/o difficulty. Affect pleasant   Telemetry   A FIb 110-120s Personally reviewed    Labs    CBC Recent Labs    01/25/20 0432 01/26/20 0348  WBC 11.0* 11.2*  HGB 9.4* 9.6*  HCT 27.1* 27.4*  MCV 73.0* 74.1*  PLT 252 474   Basic Metabolic Panel Recent Labs     01/25/20 0432 01/26/20 0348  NA 133* 130*  K 3.5 3.4*  CL 97* 97*  CO2 26 26  GLUCOSE 116* 128*  BUN 38* 32*  CREATININE 2.27* 2.09*  CALCIUM 8.1* 8.3*   Liver Function Tests Recent Labs    01/23/20 2359  AST 25  ALT 16  ALKPHOS 188*  BILITOT 1.8*  PROT 7.0  ALBUMIN 2.5*   No results for input(s): LIPASE, AMYLASE in the last 72 hours. Cardiac Enzymes No results for input(s): CKTOTAL, CKMB, CKMBINDEX, TROPONINI in the last 72 hours.  BNP: BNP (last 3 results) Recent Labs    11/25/19 1004 12/26/19 1515 01/22/20 1850  BNP 2,757.9* 1,843.7* 2,557.6*    ProBNP (last 3 results) Recent Labs    08/15/19 0949  PROBNP 16,511*     D-Dimer No results for input(s): DDIMER in the last 72 hours. Hemoglobin A1C No results for input(s): HGBA1C in the last 72 hours. Fasting Lipid Panel No results for input(s): CHOL, HDL, LDLCALC, TRIG, CHOLHDL, LDLDIRECT in the last 72 hours. Thyroid Function Tests No results for input(s): TSH, T4TOTAL, T3FREE, THYROIDAB in the last 72 hours.  Invalid input(s): FREET3  Other results:   Imaging    DG CHEST PORT 1 VIEW  Result Date: 01/26/2020 CLINICAL DATA:  Respiratory distress, chest pain EXAM: PORTABLE CHEST 1 VIEW COMPARISON:  02/03/2020 FINDINGS: Cardiomegaly with pulmonary vascular congestion. No frank interstitial edema. No pleural effusion or pneumothorax. Right IJ venous catheter terminates  in the upper right atrium. Thoracic aortic atherosclerosis. IMPRESSION: Cardiomegaly with pulmonary vascular congestion. No frank interstitial edema. Right IJ venous catheter terminates in the upper right atrium. Electronically Signed   By: Julian Hy M.D.   On: 01/26/2020 07:43     Medications:     Scheduled Medications: . atorvastatin  80 mg Oral Daily  . Chlorhexidine Gluconate Cloth  6 each Topical Daily  . feeding supplement (ENSURE ENLIVE)  237 mL Oral Daily  . ferrous sulfate  325 mg Oral BID WC  . gabapentin  300 mg  Oral QHS  . hydrALAZINE  10 mg Oral BID  . isosorbide mononitrate  30 mg Oral Daily  . mouth rinse  15 mL Mouth Rinse BID  . mupirocin ointment  1 application Nasal BID  . pantoprazole  40 mg Oral BID  . spironolactone  25 mg Oral Daily  . torsemide  60 mg Oral Daily    Infusions: . heparin 1,100 Units/hr (01/26/20 0800)  . milrinone 0.2469 mcg/kg/min (01/26/20 0800)    PRN Medications: acetaminophen, albuterol, ondansetron (ZOFRAN) IV, zolpidem    Assessment/Plan    1. Acute on chronic Biventricular CHF  -> cardiogenic shock - known ICM dating back to 2013.  - Echo 1/21showed LVEF 25-30%, global hypokinesis, mild LVH, RV mildly enlarged w/ severely reduced systolic function. Moderate MR. Mild TR. Severe bi atrial enlargement. - RHC on 3/25 showed elevated biventricular filling pressures and with equalization of R &L sided diastolic pressuresand preserved CO consistent withrestrictive physiology. - Developed cardiogenic shock on 5/20 with initial co-ox 36%.  - Hemodynamics markedly  improved on milrinone 0.25. CO now normalized. Volume status looks good.  - Continue milrinone support through OR - Continue hydralazine/nitrates & spiro -  Now off IV lasix and back on torsemide 60 daily (home dose is 100 bid) - No ARB/ARNI with CKD IV - No b-blocker with shock - Plan was to leave swan in for OR but got pulled out overnight. Leave introducer for now  2 PAD w/ Claudication  - knownR external iliac artery occlusion with severe infrainguinal arterial occlusive disease and peroneal runoff only. L SFA occlusion with diffuse infrainguinal arterial occlusive disease - Dr. Trula Slade plans on a left iliac stent +/- bilateral femoral endarterectomies, and a left to right femorofemoral bypass - This is likely as good as he is going to get from a CV standpoint but need pulmonary toilet for possible COPD flare prior to going to OR  3. CKD IV:  - Creatinine baseline ~2.5 - Trend  2.66>>2.54 >>2.64  -> 2.27 -> 2.09 (improved with inotropic support) - Continue milrinone. Monitor   4 CAD:  - prior LAD and LCx stenting - no s/s angina - no ASA due to apixaban - continue high dose statin   6.Chronic AF - Rate up with addition of milrinon - Off b-blocker due to shock. Can add low-dose IV amio as needed for rate control while on milirinone - now on  IV heparin per pharmacy   7. Anemia , Iron Deficient  - Received Feraheme 3/29. - continue PO iron - Hgb 9.5  8. Hyponatremia - Na 121> 123  -> 133 -> 130 - improved with tovlaptan and hemodynamic support  9. COPD -stable  10. Hypokalemia - K 3.4. Will supp  11. Fever/productive cough - ? COPD flare - CXR clear - get blood/sputum cx. - start abx  Can go to floor now that [place] out.    Length of Stay: 4  Glori Bickers, MD  01/26/2020, 11:41 AM  Advanced Heart Failure Team Pager [phone] (M-F; 7a - 4p)  Please contact [facility] for night-coverage after hours (4p -7a ) and weekends on [email or web address]

## 2020-01-26 NOTE — Progress Notes (Signed)
Pharmacy Antibiotic Note  Logan White is a 79 y.o. male admitted on 01/22/2020 for HF optimization prior to vascular surgery. He is now reporting symptoms concerning for COPD exacerbation. WBC elevated 11.2 (no steroids) and temp 100.2. CXR clear. Pharmacy has been consulted for vancomycin and cefepime dosing.  Plan: Vancomycin 1000 mg IV every 24 hours.  Goal trough 15-20 mcg/mL.  Cefepime 2 g IV q12h  Monitor renal function, cultures, and length of therapy  Height: 6\' 3"  (190.5 cm) Weight: 74.7 kg (164 lb 10.9 oz) IBW/kg (Calculated) : 84.5  Temp (24hrs), Avg:99.3 F (37.4 C), Min:97.7 F (36.5 C), Max:100.4 F (38 C)  Recent Labs  Lab 01/22/20 1850 01/23/20 0343 01/23/20 2359 01/24/20 0340 01/24/20 1155 01/25/20 0432 01/26/20 0348  WBC 9.1  --  10.6*  --   --  11.0* 11.2*  CREATININE 2.66*   < > 2.71* 2.64* 2.55* 2.27* 2.09*   < > = values in this interval not displayed.    Estimated Creatinine Clearance: 31.8 mL/min (A) (by C-G formula based on SCr of 2.09 mg/dL (H)).    No Known Allergies  Antimicrobials this admission: Vancomycin 5/23 >> Cefepime 5/23 >>  Dose adjustments this admission: None  Microbiology results: 5/23 BCx: sent 5/23 Sputum: sent  5/20 MRSA PCR: neg  Thank you for allowing pharmacy to be a part of this patient's care.  Vertis Kelch, PharmD, BCPS PGY2 Cardiology Pharmacy Resident Phone [phone] 01/26/2020       12:08 PM  Please check [email or web address] for unit-specific pharmacist phone numbers

## 2020-01-26 NOTE — Progress Notes (Signed)
Pt with lots of PVC's and runs of VT. Pa cath @ 60cm wave RV. Electrolyte per flowsheet and replaced per [facility]. Swan pulled back to RA. Cardiology notified.

## 2020-01-26 NOTE — Progress Notes (Signed)
ANTICOAGULATION CONSULT NOTE  Pharmacy Consult for Heparin Indication: atrial fibrillation  Patient Measurements: Height: 6\' 3"  (190.5 cm) Weight: 74.7 kg (164 lb 10.9 oz) IBW/kg (Calculated) : 84.5 Heparin Dosing Weight: 73.5kg  Vital Signs: Temp: 97.7 F (36.5 C) (05/23 0600) Temp Source: Core (Comment) (05/23 0400) BP: 75/63 (05/23 0600) Pulse Rate: 68 (05/23 0600)  Labs: Recent Labs    01/23/20 2359 01/23/20 2359 01/24/20 0340 01/24/20 0340 01/24/20 1043 01/24/20 1155 01/24/20 1624 01/24/20 1626 01/24/20 1626 01/25/20 0432 01/25/20 0952 01/26/20 0348  HGB 10.2*  --   --   --   --   --    < > 11.2*   < > 9.4*  --  9.6*  HCT 30.1*  --   --   --   --   --    < > 33.0*  --  27.1*  --  27.4*  PLT 254  --   --   --   --   --   --   --   --  252  --  245  APTT 92*  --  82*  --   --   --   --   --   --   --  122*  --   LABPROT 21.0*  --   --   --   --   --   --   --   --   --   --   --   INR 1.9*  --   --   --   --   --   --   --   --   --   --   --   HEPARINUNFRC  --   --  0.69   < > 0.41  --   --   --   --   --  0.33 0.57  CREATININE 2.71*   < > 2.64*   < >  --  2.55*  --   --   --  2.27*  --  2.09*   < > = values in this interval not displayed.    Estimated Creatinine Clearance: 31.8 mL/min (A) (by C-G formula based on SCr of 2.09 mg/dL (H)).  Medical History: Past Medical History:  Diagnosis Date  . AAA (abdominal aortic aneurysm) ([place])    a. 08/2015 Abd U/S: 2.7 x 2.9 x 3.2 cm infrarenal AAA.  . Bradycardia    a. Requiring discontinuation of use of BB/CCB  . Cardiac arrest - ventricular fibrillation    a. 03/2012 in setting of hypokalemia (prolonged hosp with VDRF, tracheobronchitis, ARF, shock liver, PAF, AMS felt secondary to post-anoxic encephalopathy/shock).  . Carotid artery stenosis    a. 01/2011 - 40-59% bilateral stenosis;  b. 06/2015 Carotid U/S: 40-59% bilat ICA stenosis->f/u 6 mos.  . CHF (congestive heart failure) ([place])   . CKD (chronic kidney  disease), stage III   . Coronary artery disease    a. s/p aborted ant STEMI tx with Cypher DES to LAD 10/04 (residual at cath: D1 50%, CFX 40% and multiple dist 70%, EF 55%);   b. myoview 3/10: Ef 47%, infero-apical isch, LOW RISK - med Tx recommended;  c. 12/2012 VF Arrest/Cath: LAD 40isr, 80 apical, LCX 100 (failed PTCA), RCA nondom.  . Diabetes mellitus   . Diverticulosis 2001  . Elevated LFTs    Shock liver 03/2012  . H/O: CVA (cardiovascular accident)   . Hematemesis    a. 12/2010 felt 2/2 Mack Guise tear -  pt could not afford colonscopy/EGD at that time so Coumadin was deferred. Coumadin initiated 03/2012 without any evidence for bleeding.  Marland Kitchen History of Ischemic Cardiomyopathy    a. EF 50-55% by echo 03/09/12 (was 30% by echo 02/24/12); b. 03/2013 Echo: EF 55-60%, mild MR, sev dil LA, mod dil RA, PASP 27mmHg.  Marland Kitchen Hypertensive heart disease    a. echo 4/12: EF 50%, asymmetric septal hypertrophy, no SAM or LVOT gradient, LAE, PASP 35  . Inguinal hernia   . 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 Iron deficiency anemia   . Mitral regurgitation    a. Mild by echo 03/2012 and 03/2013.  . Mixed Hyperlipidemia   . Peripheral vascular disease ([place])    a. h/o LE angioplasty;  b. 08/2015 Duplex: >50% RCIA, 100% REIA, >50% LEIA, elev vel in SMA, patent IVC.  Marland Kitchen Persistent atrial fibrillation ([place])    a. Dx 12/2010-->coumadin (CHA2DS2VASc = 7).  . QT prolongation   . Tubular adenoma of colon 11/2019   Assessment: [name] starting on heparin for afib (CHADS2VASc = 6). PTA Apixaban on hold for planned vascular surgery 5/21. Last dose 5/19 AM.  Patient has been more anemic (Hgb < 11) since March 2021, after undergoing fem-fem bypass.  S/p RHC on 5/21, marked volume overload with improved cardiac output on milrinone. Plan is to continue milrinone and diuresis. Pharmacy consulted to resume heparin 4 hours post-sheath removal.   Heparin level is therapeutic at 0.57. CBC  stable. No active bleed issues reported.  Goal of Therapy:  Heparin level 0.3-0.7 units/ml  Monitor platelets by anticoagulation protocol: Yes   Plan:  Continue IV heparin at 1100 units/hr Monitor daily heparin level and CBC, s/sx bleeding F/u plans to switch back to oral anticoagulation eventually  Vertis Kelch, PharmD, [facility] PGY2 Cardiology Pharmacy Resident Phone [phone] 01/26/2020       7:17 AM  Please check [email or web address] for unit-specific pharmacist phone numbers

## 2020-01-26 NOTE — Progress Notes (Addendum)
Pt's swan started having right ventricle  Waveform followed by frequent V-tach.   Gavin Pound RN assisted at bedside to pull swann out into right atrium while Cards fellow was advising on the phone. Chest x-ray confirmed swan placement in right atrium.  Cards fellow came to bedside and decided not to advance swan.  Pt remained hemodynamically stable, will pass along in report and monitor closely.

## 2020-01-27 ENCOUNTER — Inpatient Hospital Stay (HOSPITAL_COMMUNITY): Payer: Medicare HMO

## 2020-01-27 LAB — BASIC METABOLIC PANEL
Anion gap: 11 (ref 5–15)
BUN: 29 mg/dL — ABNORMAL HIGH (ref 8–23)
CO2: 24 mmol/L (ref 22–32)
Calcium: 8.5 mg/dL — ABNORMAL LOW (ref 8.9–10.3)
Chloride: 92 mmol/L — ABNORMAL LOW (ref 98–111)
Creatinine, Ser: 2.24 mg/dL — ABNORMAL HIGH (ref 0.61–1.24)
GFR calc Af Amer: 32 mL/min — ABNORMAL LOW (ref >60)
GFR calc non Af Amer: 27 mL/min — ABNORMAL LOW (ref >60)
Glucose, Bld: 124 mg/dL — ABNORMAL HIGH (ref 70–99)
Potassium: 4.5 mmol/L (ref 3.5–5.1)
Sodium: 127 mmol/L — ABNORMAL LOW (ref 135–145)

## 2020-01-27 LAB — CBC
HCT: 26 % — ABNORMAL LOW (ref 39.0–52.0)
Hemoglobin: 9 g/dL — ABNORMAL LOW (ref 13.0–17.0)
MCH: 25.6 pg — ABNORMAL LOW (ref 26.0–34.0)
MCHC: 34.6 g/dL (ref 30.0–36.0)
MCV: 73.9 fL — ABNORMAL LOW (ref 80.0–100.0)
Platelets: 224 10*3/uL (ref 150–400)
RBC: 3.52 MIL/uL — ABNORMAL LOW (ref 4.22–5.81)
RDW: 17.8 % — ABNORMAL HIGH (ref 11.5–15.5)
WBC: 10.8 10*3/uL — ABNORMAL HIGH (ref 4.0–10.5)
nRBC: 0 % (ref 0.0–0.2)

## 2020-01-27 LAB — COOXEMETRY PANEL
Carboxyhemoglobin: 1.5 % (ref 0.5–1.5)
Methemoglobin: 1.2 % (ref 0.0–1.5)
O2 Saturation: 56.9 %
Total hemoglobin: 9.7 g/dL — ABNORMAL LOW (ref 12.0–16.0)

## 2020-01-27 LAB — HEPARIN LEVEL (UNFRACTIONATED): Heparin Unfractionated: 0.41 IU/mL (ref 0.30–0.70)

## 2020-01-27 MED ORDER — SODIUM CHLORIDE 0.9% FLUSH
10.0000 mL | Freq: Two times a day (BID) | INTRAVENOUS | Status: DC
  Administered 2020-01-27 – 2020-01-29 (×5): 10 mL
  Administered 2020-01-30: 30 mL

## 2020-01-27 MED ORDER — SODIUM CHLORIDE 0.9% FLUSH: 10.0000 mL | INTRAVENOUS | Status: DC | PRN

## 2020-01-27 MED ORDER — MILRINONE LACTATE IN DEXTROSE 20-5 MG/100ML-% IV SOLN
0.2500 ug/kg/min | INTRAVENOUS | Status: DC
  Administered 2020-01-27 – 2020-01-29 (×3): 0.25 ug/kg/min via INTRAVENOUS
  Filled 2020-01-27 (×4): qty 100

## 2020-01-27 NOTE — Progress Notes (Signed)
Advanced Heart Failure Rounding Note  PCP-Cardiologist: Kirk Ruths, MD   Subjective:    Surgery cancelled on 5/20 sue to shock. Initial co-ox 36%.   Remains on milrinone 0.25. Co-ox 57% Back on po diuretics. Says he feels ok. Denies CP or SOB.  Cough improving   Started on abx for respiratory infection on 5/23. Tmax 100.4  Objective:   Weight Range: 72 kg Body mass index is 19.84 kg/m.   Vital Signs:   Temp:  [98.3 F (36.8 C)-100.4 F (38 C)] 98.3 F (36.8 C) (05/24 0726) Pulse Rate:  [33-125] 107 (05/24 0900) Resp:  [20-36] 26 (05/24 0900) BP: (85-120)/(57-107) 113/70 (05/24 0900) SpO2:  [96 %-100 %] 100 % (05/24 0900) Weight:  [72 kg] 72 kg (05/24 0500)    Weight change: Filed Weights   01/25/20 0500 01/26/20 0500 01/27/20 0500  Weight: 68.7 kg 74.7 kg 72 kg    Intake/Output:   Intake/Output Summary (Last 24 hours) at 01/27/2020 1005 Last data filed at 01/27/2020 0900 Gross per 24 hour  Intake 1617.67 ml  Output 1850 ml  Net -232.33 ml      Physical Exam    General:  Weak Chronically ill appearing. Lying flat . No resp difficulty HEENT: normal Neck: supple.RI introducer. Carotids 2+ bilat; no bruits. No lymphadenopathy or thryomegaly appreciated. Cor: PMI nondisplaced. Irregular tachy No rubs, gallops or murmurs. Lungs: mild diffuse exp wheeze Abdomen: soft, nontender, nondistended. No hepatosplenomegaly. No bruits or masses. Good bowel sounds. Extremities: no cyanosis, clubbing, rash, edema + healed wounds Neuro: alert & orientedx3, cranial nerves grossly intact. moves all 4 extremities w/o difficulty. Affect flat   Telemetry   A FIb 100-110 Personally reviewed   Labs    CBC Recent Labs    01/26/20 0348 01/27/20 0224  WBC 11.2* 10.8*  HGB 9.6* 9.0*  HCT 27.4* 26.0*  MCV 74.1* 73.9*  PLT 245 378   Basic Metabolic Panel Recent Labs    01/26/20 0348 01/27/20 0224  NA 130* 127*  K 3.4* 4.5  CL 97* 92*  CO2 26 24  GLUCOSE  128* 124*  BUN 32* 29*  CREATININE 2.09* 2.24*  CALCIUM 8.3* 8.5*   Liver Function Tests No results for input(s): AST, ALT, ALKPHOS, BILITOT, PROT, ALBUMIN in the last 72 hours. No results for input(s): LIPASE, AMYLASE in the last 72 hours. Cardiac Enzymes No results for input(s): CKTOTAL, CKMB, CKMBINDEX, TROPONINI in the last 72 hours.  BNP: BNP (last 3 results) Recent Labs    11/25/19 1004 12/26/19 1515 01/22/20 1850  BNP 2,757.9* 1,843.7* 2,557.6*    ProBNP (last 3 results) Recent Labs    08/15/19 0949  PROBNP 16,511*     D-Dimer No results for input(s): DDIMER in the last 72 hours. Hemoglobin A1C No results for input(s): HGBA1C in the last 72 hours. Fasting Lipid Panel No results for input(s): CHOL, HDL, LDLCALC, TRIG, CHOLHDL, LDLDIRECT in the last 72 hours. Thyroid Function Tests No results for input(s): TSH, T4TOTAL, T3FREE, THYROIDAB in the last 72 hours.  Invalid input(s): FREET3  Other results:   Imaging    No results found.   Medications:     Scheduled Medications: . atorvastatin  80 mg Oral Daily  . Chlorhexidine Gluconate Cloth  6 each Topical Daily  . feeding supplement (ENSURE ENLIVE)  237 mL Oral Daily  . ferrous sulfate  325 mg Oral BID WC  . gabapentin  300 mg Oral QHS  . hydrALAZINE  10 mg Oral BID  .  isosorbide mononitrate  30 mg Oral Daily  . mouth rinse  15 mL Mouth Rinse BID  . mupirocin ointment  1 application Nasal BID  . pantoprazole  40 mg Oral BID  . sodium chloride flush  10-40 mL Intracatheter Q12H  . spironolactone  25 mg Oral Daily  . torsemide  60 mg Oral Daily    Infusions: . ceFEPime (MAXIPIME) IV Stopped (01/27/20 0830)  . heparin 1,100 Units/hr (01/27/20 0900)  . milrinone 0.2469 mcg/kg/min (01/27/20 0900)  . vancomycin 1,000 mg (01/26/20 1413)    PRN Medications: acetaminophen, albuterol, ondansetron (ZOFRAN) IV, sodium chloride, sodium chloride flush, zolpidem    Assessment/Plan    1. Acute on  chronic Biventricular CHF  -> cardiogenic shock - known ICM dating back to 2013.  - Echo 1/21showed LVEF 25-30%, global hypokinesis, mild LVH, RV mildly enlarged w/ severely reduced systolic function. Moderate MR. Mild TR. Severe bi atrial enlargement. - RHC on 3/25 showed elevated biventricular filling pressures and with equalization of R &L sided diastolic pressuresand preserved CO consistent withrestrictive physiology. - Developed cardiogenic shock on 5/20 with initial co-ox 36%.  - Hemodynamics markedly  improved on milrinone 0.25. Co-ox 57%. CO now normalized. Volume status looks good.  - Continue milrinone support through OR.  - Continue hydralazine/nitrates & spiro -  Now off IV lasix and back on torsemide 60 daily (home dose is 100 bid). Weight down to baseline. - No ARB/ARNI with CKD IV - No b-blocker with shock   2 PAD w/ Claudication  - knownR external iliac artery occlusion with severe infrainguinal arterial occlusive disease and peroneal runoff only. L SFA occlusion with diffuse infrainguinal arterial occlusive disease - Dr. Trula Slade plans on a left iliac stent +/- bilateral femoral endarterectomies, and a left to right femorofemoral bypass - This is likely as good as he is going to get from a CV standpoint but need pulmonary toilet for possible COPD flare prior to going to OR - I have d/w Dr. Trula Slade and possibly to OR on Wednesday  3. Fever/productive cough - ? COPD flare - CXR clear - Sputum GS with MOD GNR - on vanc/cefipime - start nebs  4. CKD IV:  - Creatinine baseline ~2.5 - Trend 2.66>>2.54 >>2.64  -> 2.27 -> 2.09 -> 2.24 - Continue milrinone. Daily BMETs  5 CAD:  - prior LAD and LCx stenting - no s/s angina - no ASA due to apixaban - continue high dose statin   6.Chronic AF - Rate up with addition of milrinon - Off b-blocker due to shock. Can add low-dose IV amio as needed for rate control while on milirinone - now on  IV heparin per pharmacy    7. Hyponatremia - Na 121> 123  -> 133 -> 130 -> 127 - improved with tovlaptan and hemodynamic support  8. Hypokalemia - K 4.5  Overall very tenuous with chronic HF, debilitation and probable COPD flare. Will continue attempts at medical optimization. Possible OR on Wednesday.    Length of Stay: [place], MD  01/27/2020, 10:05 AM  Advanced Heart Failure Team Pager [phone] (M-F; 7a - 4p)  Please contact [facility] for night-coverage after hours (4p -7a ) and weekends on [email or web address]

## 2020-01-27 NOTE — Progress Notes (Signed)
  Progress Note    01/27/2020 7:36 AM 3 Days Post-Op  Subjective:  A&O this morning.  Denies rest pain BLE   Vitals:   01/27/20 0700 01/27/20 0726  BP: 115/69   Pulse: (!) 122   Resp: (!) 29   Temp:  98.3 F (36.8 C)  SpO2: 99%    Physical Exam: Lungs:  Non labored on O2 by Person Extremities:  Dry ulcerations bilateral feet Neurologic: A&O  CBC    Component Value Date/Time   WBC 10.8 (H) 01/27/2020 0224   RBC 3.52 (L) 01/27/2020 0224   HGB 9.0 (L) 01/27/2020 0224   HGB 12.7 (L) 07/29/2019 1004   HCT 26.0 (L) 01/27/2020 0224   HCT 38.3 07/29/2019 1004   PLT 224 01/27/2020 0224   PLT 191 07/29/2019 1004   MCV 73.9 (L) 01/27/2020 0224   MCV 80 07/29/2019 1004   MCH 25.6 (L) 01/27/2020 0224   MCHC 34.6 01/27/2020 0224   RDW 17.8 (H) 01/27/2020 0224   RDW 15.6 (H) 07/29/2019 1004   LYMPHSABS 0.9 01/22/2020 1850   MONOABS 1.0 01/22/2020 1850   EOSABS 0.1 01/22/2020 1850   BASOSABS 0.0 01/22/2020 1850    BMET    Component Value Date/Time   NA 127 (L) 01/27/2020 0224   NA 132 (L) 12/10/2019 1409   K 4.5 01/27/2020 0224   CL 92 (L) 01/27/2020 0224   CO2 24 01/27/2020 0224   GLUCOSE 124 (H) 01/27/2020 0224   BUN 29 (H) 01/27/2020 0224   BUN 63 (H) 12/10/2019 1409   CREATININE 2.24 (H) 01/27/2020 0224   CREATININE 1.34 (H) 10/31/2016 0844   CALCIUM 8.5 (L) 01/27/2020 0224   GFRNONAA 27 (L) 01/27/2020 0224   GFRNONAA 46 (L) 10/24/2014 0943   GFRAA 32 (L) 01/27/2020 0224   GFRAA 53 (L) 10/24/2014 0943    INR    Component Value Date/Time   INR 1.9 (H) 01/23/2020 2359     Intake/Output Summary (Last 24 hours) at 01/27/2020 0736 Last data filed at 01/27/2020 0700 Gross per 24 hour  Intake 1741.49 ml  Output 1850 ml  Net -108.51 ml     Assessment/Plan:  79 y.o. male here for left iliac stent, left to right femoral to femoral bypass, +/- femoral endarterectomies which was cancelled due to CHF  BLE wounds stable compared to last week Encouraged  IS Optimized from a CV standpoint per Dr. Haroldine Laws; Dr. Trula Slade will evaluate and schedule surgery for early this week   Dagoberto Ligas, PA-C [facility] [phone] 01/27/2020 7:36 AM

## 2020-01-27 NOTE — Progress Notes (Signed)
ANTICOAGULATION CONSULT NOTE  Pharmacy Consult for Heparin Indication: atrial fibrillation  Patient Measurements: Height: 6\' 3"  (190.5 cm) Weight: 72 kg (158 lb 11.7 oz) IBW/kg (Calculated) : 84.5 Heparin Dosing Weight: 73.5kg  Vital Signs: Temp: 99.2 F (37.3 C) (05/24 1101) Temp Source: Oral (05/24 0325) BP: 109/79 (05/24 1200) Pulse Rate: 125 (05/24 1200)  Labs: Recent Labs    01/25/20 0432 01/25/20 0432 01/25/20 0952 01/26/20 0348 01/27/20 0224  HGB 9.4*   < >  --  9.6* 9.0*  HCT 27.1*  --   --  27.4* 26.0*  PLT 252  --   --  245 224  APTT  --   --  122*  --   --   HEPARINUNFRC  --   --  0.33 0.57 0.41  CREATININE 2.27*  --   --  2.09* 2.24*   < > = values in this interval not displayed.    Estimated Creatinine Clearance: 28.6 mL/min (A) (by C-G formula based on SCr of 2.24 mg/dL (H)).  Medical History: Past Medical History:  Diagnosis Date  . AAA (abdominal aortic aneurysm) ([place])    a. 08/2015 Abd U/S: 2.7 x 2.9 x 3.2 cm infrarenal AAA.  . Bradycardia    a. Requiring discontinuation of use of BB/CCB  . Cardiac arrest - ventricular fibrillation    a. 03/2012 in setting of hypokalemia (prolonged hosp with VDRF, tracheobronchitis, ARF, shock liver, PAF, AMS felt secondary to post-anoxic encephalopathy/shock).  . Carotid artery stenosis    a. 01/2011 - 40-59% bilateral stenosis;  b. 06/2015 Carotid U/S: 40-59% bilat ICA stenosis->f/u 6 mos.  . CHF (congestive heart failure) ([place])   . CKD (chronic kidney disease), stage III   . Coronary artery disease    a. s/p aborted ant STEMI tx with Cypher DES to LAD 10/04 (residual at cath: D1 50%, CFX 40% and multiple dist 70%, EF 55%);   b. myoview 3/10: Ef 47%, infero-apical isch, LOW RISK - med Tx recommended;  c. 12/2012 VF Arrest/Cath: LAD 40isr, 80 apical, LCX 100 (failed PTCA), RCA nondom.  . Diabetes mellitus   . Diverticulosis 2001  . Elevated LFTs    Shock liver 03/2012  . H/O: CVA (cardiovascular accident)   .  Hematemesis    a. 12/2010 felt 2/2 Mallory Weiss tear - pt could not afford colonscopy/EGD at that time so Coumadin was deferred. Coumadin initiated 03/2012 without any evidence for bleeding.  Marland Kitchen History of Ischemic Cardiomyopathy    a. EF 50-55% by echo 03/09/12 (was 30% by echo 02/24/12); b. 03/2013 Echo: EF 55-60%, mild MR, sev dil LA, mod dil RA, PASP 36mmHg.  Marland Kitchen Hypertensive heart disease    a. echo 4/12: EF 50%, asymmetric septal hypertrophy, no SAM or LVOT gradient, LAE, PASP 35  . Inguinal hernia   . 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 Iron deficiency anemia   . Mitral regurgitation    a. Mild by echo 03/2012 and 03/2013.  . Mixed Hyperlipidemia   . Peripheral vascular disease ([place])    a. h/o LE angioplasty;  b. 08/2015 Duplex: >50% RCIA, 100% REIA, >50% LEIA, elev vel in SMA, patent IVC.  Marland Kitchen Persistent atrial fibrillation ([place])    a. Dx 12/2010-->coumadin (CHA2DS2VASc = 7).  . QT prolongation   . Tubular adenoma of colon 11/2019   Assessment: [name] starting on heparin for afib (CHADS2VASc = 6). PTA Apixaban on hold for planned vascular surgery 5/21. Last dose 5/19 AM.  Patient  has been more anemic (Hgb < 11) since March 2021, after undergoing fem-fem bypass. Surgery held for vol up and coox low 36 started on milrinone and iv furosemide - improved Pharmacy consulted to dose heparin while apixaban held   Heparin level is therapeutic at 0.41 on heparin drip 1100 uts/hr. CBC stable. No active bleed issues reported.  Goal of Therapy:  Heparin level 0.3-0.7 units/ml  Monitor platelets by anticoagulation protocol: Yes   Plan:  Continue IV heparin at 1100 units/hr Monitor daily heparin level and CBC, s/sx bleeding F/u plans to switch back to oral anticoagulation post surgery  Bonnita Nasuti Pharm.D. CPP, BCPS Clinical Pharmacist [phone] 01/27/2020 2:31 PM    Please check [email or web address] for unit-specific pharmacist phone numbers

## 2020-01-27 NOTE — CV Procedure (Signed)
Central Venous Catheter Insertion Procedure Note AREK SPADAFORE [id number] May 23, 1941    Procedure: Insertion of Central Venous Catheter Indications: Drug and/or fluid administration   Procedure Details Consent: Risks of procedure as well as the alternatives and risks of each were explained to the (patient/caregiver).  Consent for procedure obtained. Time Out: Verified patient identification, verified procedure, site/side was marked, verified correct patient position, special equipment/implants available, medications/allergies/relevent history reviewed, required imaging and test results available.  Performed   Maximum sterile technique was used including antiseptics, cap, gloves, gown, hand hygiene, mask and sheet. Skin prep: Chlorhexidine; local anesthetic administered Given previous difficulty with venous access the decision was made to exchange the existing venous sheath in the RIJ for a triple lumen catheter. The pre-existing sheath and entire right neck were prepped in a sterile fashion. The sheath was then wired and removed. A antimicrobial bonded/coated triple lumen catheter was placed in the right internal jugular vein using the Seldinger technique. All 3 ports were flushed and the catheter was sutured in placed.    Evaluation Blood flow good Complications: No apparent complications Patient did tolerate procedure well. Chest X-ray ordered to verify placement.  CXR: pending   Glori Bickers, MD  9:01 PM

## 2020-01-28 ENCOUNTER — Encounter (HOSPITAL_COMMUNITY): Payer: Medicare HMO

## 2020-01-28 LAB — BASIC METABOLIC PANEL
Anion gap: 11 (ref 5–15)
Anion gap: 13 (ref 5–15)
BUN: 27 mg/dL — ABNORMAL HIGH (ref 8–23)
BUN: 27 mg/dL — ABNORMAL HIGH (ref 8–23)
CO2: 24 mmol/L (ref 22–32)
CO2: 22 mmol/L (ref 22–32)
Calcium: 8.3 mg/dL — ABNORMAL LOW (ref 8.9–10.3)
Calcium: 7.8 mg/dL — ABNORMAL LOW (ref 8.9–10.3)
Chloride: 92 mmol/L — ABNORMAL LOW (ref 98–111)
Chloride: 89 mmol/L — ABNORMAL LOW (ref 98–111)
Creatinine, Ser: 1.98 mg/dL — ABNORMAL HIGH (ref 0.61–1.24)
Creatinine, Ser: 1.98 mg/dL — ABNORMAL HIGH (ref 0.61–1.24)
GFR calc Af Amer: 37 mL/min — ABNORMAL LOW (ref >60)
GFR calc Af Amer: 37 mL/min — ABNORMAL LOW (ref >60)
GFR calc non Af Amer: 32 mL/min — ABNORMAL LOW (ref >60)
GFR calc non Af Amer: 32 mL/min — ABNORMAL LOW (ref >60)
Glucose, Bld: 114 mg/dL — ABNORMAL HIGH (ref 70–99)
Glucose, Bld: 151 mg/dL — ABNORMAL HIGH (ref 70–99)
Potassium: 4 mmol/L (ref 3.5–5.1)
Potassium: 4 mmol/L (ref 3.5–5.1)
Sodium: 127 mmol/L — ABNORMAL LOW (ref 135–145)
Sodium: 124 mmol/L — ABNORMAL LOW (ref 135–145)

## 2020-01-28 LAB — CBC
HCT: 25.5 % — ABNORMAL LOW (ref 39.0–52.0)
Hemoglobin: 8.9 g/dL — ABNORMAL LOW (ref 13.0–17.0)
MCH: 25.4 pg — ABNORMAL LOW (ref 26.0–34.0)
MCHC: 34.9 g/dL (ref 30.0–36.0)
MCV: 72.9 fL — ABNORMAL LOW (ref 80.0–100.0)
Platelets: 221 10*3/uL (ref 150–400)
RBC: 3.5 MIL/uL — ABNORMAL LOW (ref 4.22–5.81)
RDW: 17.4 % — ABNORMAL HIGH (ref 11.5–15.5)
WBC: 9 10*3/uL (ref 4.0–10.5)
nRBC: 0 % (ref 0.0–0.2)

## 2020-01-28 LAB — COOXEMETRY PANEL
Carboxyhemoglobin: 1.3 % (ref 0.5–1.5)
Methemoglobin: 0.6 % (ref 0.0–1.5)
O2 Saturation: 54 %
Total hemoglobin: 9.8 g/dL — ABNORMAL LOW (ref 12.0–16.0)

## 2020-01-28 LAB — HEPARIN LEVEL (UNFRACTIONATED): Heparin Unfractionated: 0.28 IU/mL — ABNORMAL LOW (ref 0.30–0.70)

## 2020-01-28 LAB — GLUCOSE, CAPILLARY: Glucose-Capillary: 161 mg/dL — ABNORMAL HIGH (ref 70–99)

## 2020-01-28 LAB — IRON AND TIBC
Iron: 15 ug/dL — ABNORMAL LOW (ref 45–182)
Saturation Ratios: 6 % — ABNORMAL LOW (ref 17.9–39.5)
TIBC: 260 ug/dL (ref 250–450)
UIBC: 245 ug/dL

## 2020-01-28 MED ORDER — FUROSEMIDE 10 MG/ML IJ SOLN
80.0000 mg | Freq: Once | INTRAMUSCULAR | Status: AC
  Administered 2020-01-28: 80 mg via INTRAVENOUS
  Filled 2020-01-28: qty 8

## 2020-01-28 MED ORDER — AMIODARONE HCL IN DEXTROSE 360-4.14 MG/200ML-% IV SOLN
60.0000 mg/h | INTRAVENOUS | Status: AC
  Administered 2020-01-28 (×2): 60 mg/h via INTRAVENOUS

## 2020-01-28 MED ORDER — SODIUM CHLORIDE 0.9 % IV SOLN
INTRAVENOUS | Status: DC | PRN
  Administered 2020-01-29 (×2): 250 mL via INTRAVENOUS

## 2020-01-28 MED ORDER — CHLORHEXIDINE GLUCONATE CLOTH 2 % EX PADS
6.0000 | MEDICATED_PAD | Freq: Every day | CUTANEOUS | Status: DC
  Administered 2020-01-29 – 2020-01-30 (×3): 6 via TOPICAL

## 2020-01-28 MED ORDER — AMIODARONE HCL IN DEXTROSE 360-4.14 MG/200ML-% IV SOLN
30.0000 mg/h | INTRAVENOUS | Status: DC
  Administered 2020-01-28 – 2020-01-29 (×2): 30 mg/h via INTRAVENOUS
  Filled 2020-01-28 (×4): qty 200

## 2020-01-28 MED ORDER — CEFAZOLIN SODIUM-DEXTROSE 1-4 GM/50ML-% IV SOLN
1.0000 g | INTRAVENOUS | Status: AC
  Filled 2020-01-28: qty 50

## 2020-01-28 NOTE — Consult Note (Signed)
   [facility] Inpatient Consult   01/28/2020  DIAMONTE STAVELY 1941-07-29 [id number]   [facility] ACO Patient: Active status, [employer] [facility] referred to [facility].  Showing as extreme high risk score noted for unplanned readmissions with 5 admissions noted in the past 6 months in [facility]'s EMR.  Patient is currently active with [facility] for chronic disease management services.  Patient has been engaged by a [place] Management Coordinator.  Our community based plan of care has focused on disease management and community resource support. Patient has been involved with [facility] team.  Patient has had both COVID vaccines noted per community review note of [place] Coordinator note.  Plan: Continue to follow for progress and patient's disposition needs. [facility] Care Coordinator address for primary care provider needs. Patient noted at ICU level of care.   Of note, [facility] Care Management services does not replace or interfere with any services that are needed or arranged by inpatient [facility] care management team.  For additional questions or referrals please contact:  Natividad Brood, RN BSN [facility] Liaison  [phone] business mobile phone Toll free office [phone]  Fax number: [phone] [email or web address]

## 2020-01-28 NOTE — Progress Notes (Addendum)
Progress Note    01/28/2020 7:36 AM 4 Days Post-Op  Subjective:  No complaints this morning.  Denies any pain in his legs right now  Tm 100.7 now afebrile HR 120's afib 55'D-322'G systolic 25% RA  Vitals:   01/28/20 0400 01/28/20 0731  BP: 107/74   Pulse: (!) 117   Resp: (!) 31   Temp:  98.7 F (37.1 C)  SpO2: 97%     Physical Exam: Cardiac:  irregular Lungs:  Non labored on RA this morning.  Extremities:  right foot with wound b/w great toe and 2nd toe is moist this am-dry gauze bw these toes.  Left foot with stable wounds   CBC    Component Value Date/Time   WBC 9.0 01/28/2020 0344   RBC 3.50 (L) 01/28/2020 0344   HGB 8.9 (L) 01/28/2020 0344   HGB 12.7 (L) 07/29/2019 1004   HCT 25.5 (L) 01/28/2020 0344   HCT 38.3 07/29/2019 1004   PLT 221 01/28/2020 0344   PLT 191 07/29/2019 1004   MCV 72.9 (L) 01/28/2020 0344   MCV 80 07/29/2019 1004   MCH 25.4 (L) 01/28/2020 0344   MCHC 34.9 01/28/2020 0344   RDW 17.4 (H) 01/28/2020 0344   RDW 15.6 (H) 07/29/2019 1004   LYMPHSABS 0.9 01/22/2020 1850   MONOABS 1.0 01/22/2020 1850   EOSABS 0.1 01/22/2020 1850   BASOSABS 0.0 01/22/2020 1850    BMET    Component Value Date/Time   NA 127 (L) 01/28/2020 0344   NA 132 (L) 12/10/2019 1409   K 4.0 01/28/2020 0344   CL 92 (L) 01/28/2020 0344   CO2 24 01/28/2020 0344   GLUCOSE 114 (H) 01/28/2020 0344   BUN 27 (H) 01/28/2020 0344   BUN 63 (H) 12/10/2019 1409   CREATININE 1.98 (H) 01/28/2020 0344   CREATININE 1.34 (H) 10/31/2016 0844   CALCIUM 8.3 (L) 01/28/2020 0344   GFRNONAA 32 (L) 01/28/2020 0344   GFRNONAA 46 (L) 10/24/2014 0943   GFRAA 37 (L) 01/28/2020 0344   GFRAA 53 (L) 10/24/2014 0943    INR    Component Value Date/Time   INR 1.9 (H) 01/23/2020 2359     Intake/Output Summary (Last 24 hours) at 01/28/2020 0736 Last data filed at 01/28/2020 0600 Gross per 24 hour  Intake 2661.93 ml  Output 4800 ml  Net -2138.07 ml   Sputum cx  Component 2 d ago    Specimen Description EXPECTORATED SPUTUM   Special Requests NONE Reflexed from [id number]   Gram Stain ABUNDANT WBC PRESENT, PREDOMINANTLY PMN  FEW SQUAMOUS EPITHELIAL CELLS PRESENT  MODERATE GRAM NEGATIVE RODS  FEW GRAM POSITIVE RODS  FEW GRAM POSITIVE COCCI IN PAIRS   Culture TOO YOUNG TO READ  Performed at [facility], [address] [place], [place] [address]     Assessment:  79 y.o. male is s/p:  here for left common iliac artery stent with left to right femorofemoral bypass which was canceled due to patient's high risk from heart failure    Plan: -pt right foot with wound b/w great toe and 2nd toe is moist this am-dry gauze bw these toes.  Left foot with stable wounds -Dr. Tempie Hoist feels pt is tuned up for surgery - possibly for OR tomorrow but Dr. Trula Slade to make decision.  If surgery tomorrow, pt will need to have pre op orders placed.  -pt with low grade fever over night but white count down this morning.  Sputum gram stain with moderate GNR  few GPC rods and cocci in pairs.  Pt on vanc/cefipime -pt on heparin gtt for afib   Leontine Locket, PA-C [facility] [phone] 01/28/2020 7:36 AM   Planning for surgery tomorrow.  I spoke with Dr. Ola Spurr with anesthesia who will evaluate him later today to confirm that everyone is comfortable with proceeding.  Annamarie Major

## 2020-01-28 NOTE — Evaluation (Signed)
Physical Therapy Evaluation Patient Details Name: Logan White MRN: [id number] DOB: 1941-02-03 Today's Date: 01/28/2020   History of Present Illness  Pt is a 79 y/o male s/p R heart cath 01/24/20. Surgery cancelled 5/20 due to shock. Plan for OR 01/29/20 for left common iliac artery stent with left to right femorofemoral bypass. PMHCA, Afib, PVD, hyperlipidemia, anemia, HTN, CVA, DM, CHF, CKD, AAA, cardiac arrest-vfib.  Clinical Impression  Pt presents with an overall decrease in functional mobility, decrease in dynamic balance and instability with amb secondary to above. PTA, pt lives with wife in level entry apartment.Today, pt able to complete bed mobility supervision with assistance for lines, sit to stand and amb min guard with RW. Pt remains moderate fall risk based on instability with stand with increased medial-lateral weight shift and anterior trunk lean.Discussed recommendations for home with home health, pt and wife agreed. Pt would benefit from continued acute PT services to maximize functional mobility and independence prior to d/c to next venue of care. Will continue to follow acutely.      Follow Up Recommendations Home health PT    Equipment Recommendations  None recommended by PT    Recommendations for Other Services       Precautions / Restrictions Precautions Precautions: Fall Restrictions Weight Bearing Restrictions: No      Mobility  Bed Mobility Overal bed mobility: Needs Assistance Bed Mobility: Supine to Sit     Supine to sit: Supervision     General bed mobility comments: Pt required supervision for bed mobility with assistance for line management  Transfers Overall transfer level: Needs assistance Equipment used: Rolling walker (2 wheeled) Transfers: Sit to/from Stand Sit to Stand: Min guard         General transfer comment: Pt required min guard sit to stand, in stand pt appeared unstready with increase bilateral lateral sway and increased  trunk flexion  Ambulation/Gait Ambulation/Gait assistance: Min guard Gait Distance (Feet): 210 Feet Assistive device: Rolling walker (2 wheeled) Gait Pattern/deviations: Step-through pattern;Decreased stride length;Wide base of support;Trunk flexed Gait velocity: decreased   General Gait Details: Pt required verbal cueing for trunk extension with amb. Pt required 3 standing rest breaks.  Stairs            Wheelchair Mobility    Modified Rankin (Stroke Patients Only)       Balance Overall balance assessment: Needs assistance   Sitting balance-Leahy Scale: Fair Sitting balance - Comments: Pt able to sit EOB without LOB, with intermittant bil UE support     Standing balance-Leahy Scale: Poor Standing balance comment: Pt required bilateral UE support and min guard for safe standing balance                             Pertinent Vitals/Pain Pain Assessment: No/denies pain    Home Living Family/patient expects to be discharged to:: Private residence Living Arrangements: Spouse/significant other;Children Available Help at Discharge: Family;Available 24 hours/day Type of Home: Apartment Home Access: Level entry     Home Layout: One level Home Equipment: Cane - single point;Shower seat;Walker - 2 wheels Additional Comments: son works     Prior Function Level of Independence: Independent with assistive device(s)         Comments: Pt reports he enjoys sitting outside enjoying the breeze.  He wears 2L supplemental 02 at home.  Pt does not drive, wife drives, performs IADLs, wife manages pt medications. Pt uses a cane at baseline.  Hand Dominance   Dominant Hand: Right    Extremity/Trunk Assessment   Upper Extremity Assessment Upper Extremity Assessment: Defer to OT evaluation    Lower Extremity Assessment Lower Extremity Assessment: Overall WFL for tasks assessed    Cervical / Trunk Assessment Cervical / Trunk Assessment: Kyphotic   Communication   Communication: No difficulties  Cognition Arousal/Alertness: Awake/alert Behavior During Therapy: WFL for tasks assessed/performed Overall Cognitive Status: Within Functional Limits for tasks assessed                                 General Comments: Pt able to follow commands and aware of safety without verbal cueing      General Comments General comments (skin integrity, edema, etc.): Pt wife in room, pt able to sit at EOB without assistance at end of session to facetime grandson    Exercises     Assessment/Plan    PT Assessment Patient needs continued PT services  PT Problem List Decreased strength;Decreased mobility;Decreased activity tolerance;Cardiopulmonary status limiting activity;Decreased balance;Decreased knowledge of use of DME       PT Treatment Interventions DME instruction;Therapeutic activities;Gait training;Therapeutic exercise;Patient/family education;Balance training;Functional mobility training    PT Goals (Current goals can be found in the Care Plan section)  Acute Rehab PT Goals Patient Stated Goal: home PT Goal Formulation: With patient Time For Goal Achievement: 02/11/20 Potential to Achieve Goals: Good    Frequency Min 3X/week   Barriers to discharge   no barriers to d/c noted    Co-evaluation               AM-PAC PT "6 Clicks" Mobility  Outcome Measure Help needed turning from your back to your side while in a flat bed without using bedrails?: None Help needed moving from lying on your back to sitting on the side of a flat bed without using bedrails?: None Help needed moving to and from a bed to a chair (including a wheelchair)?: A Little Help needed standing up from a chair using your arms (e.g., wheelchair or bedside chair)?: A Little Help needed to walk in hospital room?: A Little Help needed climbing 3-5 steps with a railing? : A Lot 6 Click Score: 19    End of Session Equipment Utilized During  Treatment: Gait belt Activity Tolerance: Patient tolerated treatment well Patient left: in bed;with family/visitor present;with call bell/phone within reach Nurse Communication: Mobility status PT Visit Diagnosis: Unsteadiness on feet (R26.81);Muscle weakness (generalized) (M62.81);Difficulty in walking, not elsewhere classified (R26.2)    Time: 7619-5093 PT Time Calculation (min) (ACUTE ONLY): 25 min   Charges:   PT Evaluation $PT Eval Moderate Complexity: 1 Mod PT Treatments $Gait Training: 8-22 mins        [employer] SPT 01/28/2020   Rolland Porter 01/28/2020, 3:09 PM

## 2020-01-28 NOTE — Progress Notes (Signed)
ANTICOAGULATION CONSULT NOTE  Pharmacy Consult for Heparin Indication: atrial fibrillation  Patient Measurements: Height: 6\' 3"  (190.5 cm) Weight: 72.2 kg (159 lb 2.8 oz) IBW/kg (Calculated) : 84.5 Heparin Dosing Weight: 73.5kg  Vital Signs: Temp: 98.7 F (37.1 C) (05/25 0731) Temp Source: Oral (05/25 0731) BP: 107/74 (05/25 0400) Pulse Rate: 117 (05/25 0400)  Labs: Recent Labs    01/25/20 6546 01/25/20 5035 01/26/20 0348 01/26/20 0348 01/27/20 0224 01/28/20 0344  HGB  --   --  9.6*   < > 9.0* 8.9*  HCT  --   --  27.4*  --  26.0* 25.5*  PLT  --   --  245  --  224 221  APTT 122*  --   --   --   --   --   HEPARINUNFRC 0.33   < > 0.57  --  0.41 0.28*  CREATININE  --   --  2.09*  --  2.24* 1.98*   < > = values in this interval not displayed.    Estimated Creatinine Clearance: 32.4 mL/min (A) (by C-G formula based on SCr of 1.98 mg/dL (H)).  Medical History: Past Medical History:  Diagnosis Date  . AAA (abdominal aortic aneurysm) ([place])    a. 08/2015 Abd U/S: 2.7 x 2.9 x 3.2 cm infrarenal AAA.  . Bradycardia    a. Requiring discontinuation of use of BB/CCB  . Cardiac arrest - ventricular fibrillation    a. 03/2012 in setting of hypokalemia (prolonged hosp with VDRF, tracheobronchitis, ARF, shock liver, PAF, AMS felt secondary to post-anoxic encephalopathy/shock).  . Carotid artery stenosis    a. 01/2011 - 40-59% bilateral stenosis;  b. 06/2015 Carotid U/S: 40-59% bilat ICA stenosis->f/u 6 mos.  . CHF (congestive heart failure) ([place])   . CKD (chronic kidney disease), stage III   . Coronary artery disease    a. s/p aborted ant STEMI tx with Cypher DES to LAD 10/04 (residual at cath: D1 50%, CFX 40% and multiple dist 70%, EF 55%);   b. myoview 3/10: Ef 47%, infero-apical isch, LOW RISK - med Tx recommended;  c. 12/2012 VF Arrest/Cath: LAD 40isr, 80 apical, LCX 100 (failed PTCA), RCA nondom.  . Diabetes mellitus   . Diverticulosis 2001  . Elevated LFTs    Shock liver 03/2012   . H/O: CVA (cardiovascular accident)   . Hematemesis    a. 12/2010 felt 2/2 Mallory Weiss tear - pt could not afford colonscopy/EGD at that time so Coumadin was deferred. Coumadin initiated 03/2012 without any evidence for bleeding.  Marland Kitchen History of Ischemic Cardiomyopathy    a. EF 50-55% by echo 03/09/12 (was 30% by echo 02/24/12); b. 03/2013 Echo: EF 55-60%, mild MR, sev dil LA, mod dil RA, PASP 61mmHg.  Marland Kitchen Hypertensive heart disease    a. echo 4/12: EF 50%, asymmetric septal hypertrophy, no SAM or LVOT gradient, LAE, PASP 35  . Inguinal hernia   . 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 Iron deficiency anemia   . Mitral regurgitation    a. Mild by echo 03/2012 and 03/2013.  . Mixed Hyperlipidemia   . Peripheral vascular disease ([place])    a. h/o LE angioplasty;  b. 08/2015 Duplex: >50% RCIA, 100% REIA, >50% LEIA, elev vel in SMA, patent IVC.  Marland Kitchen Persistent atrial fibrillation ([place])    a. Dx 12/2010-->coumadin (CHA2DS2VASc = 7).  . QT prolongation   . Tubular adenoma of colon 11/2019   Assessment: [name] starting on heparin for  afib (CHADS2VASc = 6). PTA Apixaban on hold for planned vascular surgery 5/21. Last dose 5/19 AM.  Patient has been more anemic (Hgb < 11) since March 2021, after undergoing fem-fem bypass.  Pharmacy consulted to dose heparin while apixaban is held.   Heparin level is subtherapeutic this morning at 0.28 on heparin drip 1100 uts/hr. CBC stable. No active bleed issues reported.   Goal of Therapy:  Heparin level 0.3-0.7 units/ml  Monitor platelets by anticoagulation protocol: Yes   Plan:  Increase heparin infusion to 1200 units/hr Monitor daily heparin level and CBC, s/sx bleeding F/u plans to switch back to oral anticoagulation post surgery  Agnes Lawrence, PharmD PGY1 Pharmacy Resident   Please check [email or web address] for unit-specific pharmacist phone numbers

## 2020-01-28 NOTE — Progress Notes (Addendum)
Advanced Heart Failure Rounding Note  PCP-Cardiologist: Kirk Ruths, MD   Subjective:    Surgery cancelled on 5/20 due to shock. Initial co-ox 36%.   CO-OX 54% on milrinone 0.25 mcg   Denies SOB. Wants to get surgery so he can go home.   Objective:   Weight Range: 72.2 kg Body mass index is 19.9 kg/m.   Vital Signs:   Temp:  [98.7 F (37.1 C)-100.7 F (38.2 C)] 98.7 F (37.1 C) (05/25 0731) Pulse Rate:  [73-138] 117 (05/25 0400) Resp:  [18-33] 31 (05/25 0400) BP: (76-127)/(45-85) 107/74 (05/25 0400) SpO2:  [90 %-100 %] 97 % (05/25 0400) Weight:  [72.2 kg] 72.2 kg (05/25 0500)    Weight change: Filed Weights   01/26/20 0500 01/27/20 0500 01/28/20 0500  Weight: 74.7 kg 72 kg 72.2 kg    Intake/Output:   Intake/Output Summary (Last 24 hours) at 01/28/2020 0929 Last data filed at 01/28/2020 0600 Gross per 24 hour  Intake 2529.33 ml  Output 4800 ml  Net -2270.67 ml      Physical Exam   CVP 15-16  General:   No resp difficulty HEENT: normal Neck: supple. no JVD. Carotids 2+ bilat; no bruits. No lymphadenopathy or thryomegaly appreciated. RIJ  Cor: PMI nondisplaced. Irregular rate & rhythm. No rubs, gallops or murmurs. Lungs: EW in bases  Abdomen: soft, nontender, nondistended. No hepatosplenomegaly. No bruits or masses. Good bowel sounds. Extremities: no cyanosis, clubbing, rash, edema Neuro: alert & orientedx3, cranial nerves grossly intact. moves all 4 extremities w/o difficulty. Affect pleasant   Telemetry    A fib 110-130s     Labs    CBC Recent Labs    01/27/20 0224 01/28/20 0344  WBC 10.8* 9.0  HGB 9.0* 8.9*  HCT 26.0* 25.5*  MCV 73.9* 72.9*  PLT 224 347   Basic Metabolic Panel Recent Labs    01/27/20 0224 01/28/20 0344  NA 127* 127*  K 4.5 4.0  CL 92* 92*  CO2 24 24  GLUCOSE 124* 114*  BUN 29* 27*  CREATININE 2.24* 1.98*  CALCIUM 8.5* 8.3*   Liver Function Tests No results for input(s): AST, ALT, ALKPHOS, BILITOT,  PROT, ALBUMIN in the last 72 hours. No results for input(s): LIPASE, AMYLASE in the last 72 hours. Cardiac Enzymes No results for input(s): CKTOTAL, CKMB, CKMBINDEX, TROPONINI in the last 72 hours.  BNP: BNP (last 3 results) Recent Labs    11/25/19 1004 12/26/19 1515 01/22/20 1850  BNP 2,757.9* 1,843.7* 2,557.6*    ProBNP (last 3 results) Recent Labs    08/15/19 0949  PROBNP 16,511*     D-Dimer No results for input(s): DDIMER in the last 72 hours. Hemoglobin A1C No results for input(s): HGBA1C in the last 72 hours. Fasting Lipid Panel No results for input(s): CHOL, HDL, LDLCALC, TRIG, CHOLHDL, LDLDIRECT in the last 72 hours. Thyroid Function Tests No results for input(s): TSH, T4TOTAL, T3FREE, THYROIDAB in the last 72 hours.  Invalid input(s): FREET3  Other results:   Imaging    DG Chest 1 View  Result Date: 01/27/2020 CLINICAL DATA:  Central venous catheter placement EXAM: CHEST  1 VIEW COMPARISON:  Radiograph 01/26/2020 FINDINGS: Right IJ approach central venous catheter tip terminates near the superior cavoatrial junction accounting for slight right anterior obliquity of the projection. There are diffuse mixed interstitial and hazy opacities with a basilar and perihilar predominance as well as fissural and septal thickening and cardiomegaly which may suggest features of heart failure with vascular congestion  and likely developing edema. No visible pneumothorax or effusion. The aorta is calcified. The remaining cardiomediastinal contours are unremarkable. No acute osseous or soft tissue abnormality. Degenerative changes are present in the imaged spine and shoulders. Telemetry leads overlie the chest. IMPRESSION: 1. Right IJ approach central venous catheter tip terminates near the superior cavoatrial junction. 2. Findings suggesting CHF with edema and cardiomegaly. Electronically Signed   By: Lovena Le M.D.   On: 01/27/2020 21:53     Medications:     Scheduled  Medications: . atorvastatin  80 mg Oral Daily  . Chlorhexidine Gluconate Cloth  6 each Topical Daily  . feeding supplement (ENSURE ENLIVE)  237 mL Oral Daily  . ferrous sulfate  325 mg Oral BID WC  . gabapentin  300 mg Oral QHS  . hydrALAZINE  10 mg Oral BID  . isosorbide mononitrate  30 mg Oral Daily  . mouth rinse  15 mL Mouth Rinse BID  . pantoprazole  40 mg Oral BID  . sodium chloride flush  10-40 mL Intracatheter Q12H  . spironolactone  25 mg Oral Daily  . torsemide  60 mg Oral Daily    Infusions: . ceFEPime (MAXIPIME) IV Stopped (01/27/20 2229)  . heparin 1,100 Units/hr (01/28/20 0400)  . milrinone 0.25 mcg/kg/min (01/28/20 0400)  . vancomycin 1,000 mg (01/27/20 1455)    PRN Medications: acetaminophen, albuterol, ondansetron (ZOFRAN) IV, sodium chloride, sodium chloride flush, zolpidem    Assessment/Plan    1. Acute on chronic Biventricular CHF  -> cardiogenic shock - known ICM dating back to 2013.  - Echo 1/21showed LVEF 25-30%, global hypokinesis, mild LVH, RV mildly enlarged w/ severely reduced systolic function. Moderate MR. Mild TR. Severe bi atrial enlargement. - RHC on 3/25 showed elevated biventricular filling pressures and with equalization of R &L sided diastolic pressuresand preserved CO consistent withrestrictive physiology. - Developed cardiogenic shock on 5/20 with initial co-ox 36%.  - Hemodynamics markedly  improved on milrinone 0.25. Co-ox 54%. CO now normalized.  - Continue milrinone support through OR.  - Continue hydralazine/nitrates & spiro -  CVP 15-16. Given another dose of IV lasix today. .  - Hold torsemide.  - No ARB/ARNI with CKD IV - No b-blocker with shock  2 PAD w/ Claudication  - knownR external iliac artery occlusion with severe infrainguinal arterial occlusive disease and peroneal runoff only. L SFA occlusion with diffuse infrainguinal arterial occlusive disease - Dr. Trula Slade plans on a left iliac stent +/- bilateral femoral  endarterectomies, and a left to right femorofemoral bypass - This is likely as good as he is going to get from a CV standpoint but need pulmonary toilet for possible COPD flare prior to going to OR - Dr Haroldine Laws coordinating with vascular team,  Dr. Trula Slade and possibly to OR on Wednesday  3. Fever/productive cough - ? COPD flare -- Sputum GS with MOD GNR - on vanc/cefipime - continue nebs  4. CKD IV:  - Creatinine baseline ~2.5 - Trend 2.66>>2.54 >>2.64  -> 2.27 -> 2.09 -> 2.24->1.98  - Continue milrinone.   5 CAD:  - prior LAD and LCx stenting - no s/s angina - no ASA due to apixaban - continue high dose statin   6.Chronic AF - Rate up with addition of milrinon - Off b-blocker due to shock.  - Start amio for rate control while on milrinone.  - now on  IV heparin per pharmacy   7. Hyponatremia - Na 121> 123  -> 133 -> 130 ->  127 - improved with tovlaptan and hemodynamic support  8. Hypokalemia - K 4.0    Length of Stay: [place], NP  01/28/2020, 9:29 AM  Advanced Heart Failure Team Pager [phone] (M-F; [place])  Please contact [facility] for night-coverage after hours (4p -7a ) and weekends on [email or web address]  Patient seen and examined with the above-signed Advanced Practice Provider and/or Housestaff. I personally reviewed laboratory data, imaging studies and relevant notes. I independently examined the patient and formulated the important aspects of the plan. I have edited the note to reflect any of my changes or salient points. I have personally discussed the plan with the patient and/or family.  Remains very frail. Volume status trending back up on CXR. CVP up. Remains in AF now with RVR  General:  Frail appearing. No resp difficulty HEENT: normal Neck: supple. JVP to jaw. Carotids 2+ bilat; no bruits. No lymphadenopathy or thryomegaly appreciated. Cor: PMI nondisplaced. Irr tachy . No rubs, gallops or murmurs. Lungs: rhonchorous Abdomen: soft,  nontender, nondistended. No hepatosplenomegaly. No bruits or masses. Good bowel sounds. Extremities: no cyanosis, clubbing, rash, edema + wounds  Neuro: alert & orientedx3, cranial nerves grossly intact. moves all 4 extremities w/o difficulty. Affect pleasan   Co-ox marginal on milrinone. Willcontinue for OR. Agree with Iv lasix today - give 80 IV bid. (can give 120 if limited response). Start amio for rate control while in house. Continue abx.   Once diuresed today, I think this is as good as we can get him for limb salvage procedure. I d/w VVS who will have anesthesia re-eval today for possible surgery tomorrow.   Glori Bickers, MD  10:06 AM

## 2020-01-28 NOTE — Evaluation (Signed)
Occupational Therapy Evaluation Patient Details Name: Logan White MRN: [id number] DOB: [birth year] Today's Date: 01/28/2020    History of Present Illness Pt is a 79 y/o male s/p R heart cath 01/24/20. Surgery cancelled 5/20 due to shock. Plan for OR 01/29/20 for left common iliac artery stent with left to right femorofemoral bypass. PMHCA, Afib, PVD, hyperlipidemia, anemia, HTN, CVA, DM, CHF, CKD, AAA, cardiac arrest-vfib.   Clinical Impression   This 79 y/o male presents with the above. PTA pt reports mod independence with ADL and functional mobility using RW. Pt currently presenting with overall weakness and deconditioning impacting his functional performance. Pt tolerating OOB to recliner with overall minA using RW today. He currently requires minA for LB ADL and setup assist for seated UB ADL. Pt with fluctuating HR during activity, max noted briefly 142 when upright; SpO2 >94% on RA. Pt expressing strong desire to return to his PLOF and to return home. He will benefit from continued acute OT services and currently recommend follow up [facility] services after discharge to maximize his safety and independence with ADL and mobility.     Follow Up Recommendations  Home health OT;Supervision/Assistance - 24 hour    Equipment Recommendations  3 in 1 bedside commode           Precautions / Restrictions Precautions Precautions: Fall Restrictions Weight Bearing Restrictions: No      Mobility Bed Mobility Overal bed mobility: Needs Assistance Bed Mobility: Supine to Sit     Supine to sit: Supervision     General bed mobility comments: for lines and safety, HOB elevated   Transfers Overall transfer level: Needs assistance Equipment used: Rolling walker (2 wheeled) Transfers: Sit to/from Stand Sit to Stand: Min assist;+2 safety/equipment         General transfer comment: boosting assist to rise from EOB, pt initially with tremor-like/buckling of bil LEs with initial standing,  becoming more stable once he started taking steps, transfer OOB to recliner     Balance Overall balance assessment: Needs assistance Sitting-balance support: Feet supported Sitting balance-Leahy Scale: Good Sitting balance - Comments: pt able to bend forward to don socks without difficulty    Standing balance support: Bilateral upper extremity supported Standing balance-Leahy Scale: Poor Standing balance comment: reliant on UE support/external assist                            ADL either performed or assessed with clinical judgement   ADL Overall ADL's : Needs assistance/impaired Eating/Feeding: Modified independent;Sitting Eating/Feeding Details (indicate cue type and reason): pt managing cup/drink (pouring bottle of soda into cup of ice while seated EOB) without significant difficulty  Grooming: Wash/dry face;Set up;Sitting Grooming Details (indicate cue type and reason): seated in recliner  Upper Body Bathing: Set up;Supervision/ safety;Sitting   Lower Body Bathing: Minimal assistance;+2 for safety/equipment;Sit to/from stand   Upper Body Dressing : Set up;Supervision/safety;Sitting   Lower Body Dressing: Minimal assistance;+2 for safety/equipment;Sit to/from stand Lower Body Dressing Details (indicate cue type and reason): pt donning his socks while seated EOB Toilet Transfer: Minimal assistance;+2 for safety/equipment;Stand-pivot;RW Toilet Transfer Details (indicate cue type and reason): simulated via transfer to [place] and Hygiene: Minimal assistance;+2 for safety/equipment;Sit to/from stand       Functional mobility during ADLs: Minimal assistance;+2 for safety/equipment;Rolling walker       Vision         Perception     Praxis  Pertinent Vitals/Pain Pain Assessment: No/denies pain     Hand Dominance Right   Extremity/Trunk Assessment Upper Extremity Assessment Upper Extremity Assessment: Generalized  weakness   Lower Extremity Assessment Lower Extremity Assessment: Defer to PT evaluation   Cervical / Trunk Assessment Cervical / Trunk Assessment: Kyphotic   Communication Communication Communication: HOH   Cognition Arousal/Alertness: Awake/alert Behavior During Therapy: WFL for tasks assessed/performed Overall Cognitive Status: Within Functional Limits for tasks assessed                                 General Comments: slightly HOH but overall following commands and answering questions well    General Comments  max HR briefly noted 142, overall maintaining in the 120s-130s with activity, SpO2 >95% on RA    Exercises     Shoulder Instructions      Home Living Family/patient expects to be discharged to:: Private residence Living Arrangements: Spouse/significant other;Children Available Help at Discharge: Family;Available 24 hours/day Type of Home: Apartment Home Access: Level entry     Home Layout: One level     Bathroom Shower/Tub: Teacher, early years/pre: Standard     Home Equipment: Cane - single point;Shower seat;Walker - 2 wheels   Additional Comments: son works       Prior Functioning/Environment Level of Independence: Independent with assistive device(s)        Comments: Pt reports he enjoys sitting outside enjoying the breeze.  He wears 2L supplemental 02 at home.  Pt does not drive, wife drives, performs IADLs, wife manages pt medications. Pt uses a cane at baseline.        OT Problem List: Decreased strength;Decreased range of motion;Decreased activity tolerance;Impaired balance (sitting and/or standing);Cardiopulmonary status limiting activity      OT Treatment/Interventions: Self-care/ADL training;Therapeutic exercise;Energy conservation;DME and/or AE instruction;Therapeutic activities;Patient/family education;Balance training    OT Goals(Current goals can be found in the care plan section) Acute Rehab OT  Goals Patient Stated Goal: home OT Goal Formulation: With patient Time For Goal Achievement: 02/11/20 Potential to Achieve Goals: Good  OT Frequency: Min 2X/week   Barriers to D/C:            Co-evaluation              AM-PAC OT "6 Clicks" Daily Activity     Outcome Measure Help from another person eating meals?: None Help from another person taking care of personal grooming?: A Little Help from another person toileting, which includes using toliet, bedpan, or urinal?: A Little Help from another person bathing (including washing, rinsing, drying)?: A Little Help from another person to put on and taking off regular upper body clothing?: A Little Help from another person to put on and taking off regular lower body clothing?: A Little 6 Click Score: 19   End of Session Equipment Utilized During Treatment: Gait belt;Rolling walker Nurse Communication: Mobility status  Activity Tolerance: Patient tolerated treatment well Patient left: in chair;with call bell/phone within reach  OT Visit Diagnosis: Muscle weakness (generalized) (M62.81);Unsteadiness on feet (R26.81)                Time: 7673-4193 OT Time Calculation (min): 29 min Charges:  OT General Charges $OT Visit: 1 Visit OT Evaluation $OT Eval Moderate Complexity: 1 Mod OT Treatments $Self Care/Home Management : 8-22 mins  Lou Cal, OT Acute Rehabilitation Services Pager [phone] Office [phone]   Raymondo Band 01/28/2020, 5:21 PM

## 2020-01-29 ENCOUNTER — Inpatient Hospital Stay (HOSPITAL_COMMUNITY): Payer: Medicare HMO | Admitting: Anesthesiology

## 2020-01-29 ENCOUNTER — Encounter (HOSPITAL_COMMUNITY)
Admission: RE | Disposition: A | Discharge status: Home / Self Care | Payer: Self-pay | Source: Ambulatory Visit | Attending: Internal Medicine

## 2020-01-29 ENCOUNTER — Encounter (HOSPITAL_COMMUNITY): Payer: Self-pay | Admitting: Internal Medicine

## 2020-01-29 LAB — COOXEMETRY PANEL
Carboxyhemoglobin: 1.1 % (ref 0.5–1.5)
Carboxyhemoglobin: 1.6 % — ABNORMAL HIGH (ref 0.5–1.5)
Methemoglobin: 0.7 % (ref 0.0–1.5)
Methemoglobin: 1.1 % (ref 0.0–1.5)
O2 Saturation: 43.6 %
O2 Saturation: 55.5 %
Total hemoglobin: 9.3 g/dL — ABNORMAL LOW (ref 12.0–16.0)
Total hemoglobin: 10 g/dL — ABNORMAL LOW (ref 12.0–16.0)

## 2020-01-29 LAB — CBC
HCT: 24.9 % — ABNORMAL LOW (ref 39.0–52.0)
Hemoglobin: 8.8 g/dL — ABNORMAL LOW (ref 13.0–17.0)
MCH: 25.4 pg — ABNORMAL LOW (ref 26.0–34.0)
MCHC: 35.3 g/dL (ref 30.0–36.0)
MCV: 71.8 fL — ABNORMAL LOW (ref 80.0–100.0)
Platelets: 243 10*3/uL (ref 150–400)
RBC: 3.47 MIL/uL — ABNORMAL LOW (ref 4.22–5.81)
RDW: 17.5 % — ABNORMAL HIGH (ref 11.5–15.5)
WBC: 9.9 10*3/uL (ref 4.0–10.5)
nRBC: 0.3 % — ABNORMAL HIGH (ref 0.0–0.2)

## 2020-01-29 LAB — CULTURE, RESPIRATORY W GRAM STAIN: Culture: NORMAL

## 2020-01-29 LAB — BASIC METABOLIC PANEL
Anion gap: 12 (ref 5–15)
BUN: 29 mg/dL — ABNORMAL HIGH (ref 8–23)
CO2: 22 mmol/L (ref 22–32)
Calcium: 8 mg/dL — ABNORMAL LOW (ref 8.9–10.3)
Chloride: 90 mmol/L — ABNORMAL LOW (ref 98–111)
Creatinine, Ser: 2.08 mg/dL — ABNORMAL HIGH (ref 0.61–1.24)
GFR calc Af Amer: 35 mL/min — ABNORMAL LOW (ref >60)
GFR calc non Af Amer: 30 mL/min — ABNORMAL LOW (ref >60)
Glucose, Bld: 122 mg/dL — ABNORMAL HIGH (ref 70–99)
Potassium: 3.7 mmol/L (ref 3.5–5.1)
Sodium: 124 mmol/L — ABNORMAL LOW (ref 135–145)

## 2020-01-29 LAB — HEPARIN LEVEL (UNFRACTIONATED): Heparin Unfractionated: 0.41 IU/mL (ref 0.30–0.70)

## 2020-01-29 SURGERY — CREATION, BYPASS, ARTERIAL, FEMORAL TO FEMORAL, USING GRAFT
Anesthesia: General | Laterality: Bilateral

## 2020-01-29 MED ORDER — SODIUM CHLORIDE 0.9 % IV SOLN: INTRAVENOUS | Status: DC

## 2020-01-29 MED ORDER — ROCURONIUM BROMIDE 10 MG/ML (PF) SYRINGE
PREFILLED_SYRINGE | INTRAVENOUS | Status: AC
  Filled 2020-01-29: qty 10

## 2020-01-29 MED ORDER — EPHEDRINE 5 MG/ML INJ
INTRAVENOUS | Status: AC
  Filled 2020-01-29: qty 10

## 2020-01-29 MED ORDER — GLYCOPYRROLATE PF 0.2 MG/ML IJ SOSY
PREFILLED_SYRINGE | INTRAMUSCULAR | Status: AC
  Filled 2020-01-29: qty 1

## 2020-01-29 MED ORDER — POTASSIUM CHLORIDE CRYS ER 20 MEQ PO TBCR
30.0000 meq | EXTENDED_RELEASE_TABLET | Freq: Once | ORAL | Status: AC
  Administered 2020-01-29: 30 meq via ORAL
  Filled 2020-01-29: qty 1

## 2020-01-29 MED ORDER — LIDOCAINE 2% (20 MG/ML) 5 ML SYRINGE
INTRAMUSCULAR | Status: AC
  Filled 2020-01-29: qty 5

## 2020-01-29 MED ORDER — PHENYLEPHRINE 40 MCG/ML (10ML) SYRINGE FOR IV PUSH (FOR BLOOD PRESSURE SUPPORT)
PREFILLED_SYRINGE | INTRAVENOUS | Status: AC
  Filled 2020-01-29: qty 10

## 2020-01-29 MED ORDER — DEXAMETHASONE SODIUM PHOSPHATE 10 MG/ML IJ SOLN
INTRAMUSCULAR | Status: AC
  Filled 2020-01-29: qty 1

## 2020-01-29 MED ORDER — PROTAMINE SULFATE 10 MG/ML IV SOLN
INTRAVENOUS | Status: AC
  Filled 2020-01-29: qty 5

## 2020-01-29 MED ORDER — ONDANSETRON HCL 4 MG/2ML IJ SOLN
INTRAMUSCULAR | Status: AC
  Filled 2020-01-29: qty 4

## 2020-01-29 MED ORDER — HEPARIN SODIUM (PORCINE) 1000 UNIT/ML IJ SOLN
INTRAMUSCULAR | Status: AC
  Filled 2020-01-29: qty 2

## 2020-01-29 MED ORDER — PROMETHAZINE HCL 25 MG/ML IJ SOLN
12.5000 mg | Freq: Four times a day (QID) | INTRAMUSCULAR | Status: DC | PRN
  Administered 2020-01-29: 12.5 mg via INTRAVENOUS
  Filled 2020-01-29: qty 1

## 2020-01-29 MED ORDER — PROPOFOL 10 MG/ML IV BOLUS
INTRAVENOUS | Status: AC
  Filled 2020-01-29: qty 20

## 2020-01-29 MED ORDER — FENTANYL CITRATE (PF) 250 MCG/5ML IJ SOLN
INTRAMUSCULAR | Status: AC
  Filled 2020-01-29: qty 5

## 2020-01-29 MED ORDER — SODIUM CHLORIDE 0.9 % IV SOLN
510.0000 mg | INTRAVENOUS | Status: DC
  Administered 2020-01-29: 510 mg via INTRAVENOUS
  Filled 2020-01-29: qty 17

## 2020-01-29 NOTE — Plan of Care (Signed)
  Problem: Education: Goal: Ability to demonstrate management of disease process will improve Outcome: Progressing Goal: Ability to verbalize understanding of medication therapies will improve Outcome: Progressing Goal: Individualized Educational Video(s) Outcome: Progressing   Problem: Activity: Goal: Capacity to carry out activities will improve Outcome: Progressing   Problem: Cardiac: Goal: Ability to achieve and maintain adequate cardiopulmonary perfusion will improve Outcome: Progressing   Problem: Education: Goal: Knowledge of General Education information will improve Description: Including pain rating scale, medication(s)/side effects and non-pharmacologic comfort measures Outcome: Progressing   Problem: Health Behavior/Discharge Planning: Goal: Ability to manage health-related needs will improve Outcome: Progressing

## 2020-01-29 NOTE — Progress Notes (Addendum)
Subjective  -   No longer wants to have his surgery   Physical Exam:  Small bilateral toe ulcerations Abdomen soft and nontender No significant lower extremity edema    Assessment/Plan:    Bilateral lower extremity ulceration in the setting of multilevel vascular disease: The patient has been in the hospital for a week trying to get tuned up for surgery.  I discussed with him that the first operation would be bilateral femoral endarterectomies with patch angioplasty, a left iliac stent, and a left to right femoral-femoral bypass.  Following that I suspect he will need bilateral femoral-popliteal bypasses to heal his wounds.  After careful consideration and conversations with Dr. Haroldine Laws and myself, the patient no longer wants to proceed with surgical revascularization due to the high potential risks including cardiac issues, renal failure, and possibly death.  He understands that he will likely ultimately require bilateral amputations.  He was very clear that he does not want to proceed with revascularization.  I will continue to see him in follow-up to monitor his wounds.  I have a clinic visit scheduled in 3 to 4 weeks.  Logan White 01/29/2020 7:51 PM --  Vitals:   01/29/20 1800 01/29/20 1835  BP: (!) 125/96 (!) 114/52  Pulse: (!) 107 100  Resp: (!) 26 (!) 23  Temp:  98.9 F (37.2 C)  SpO2: 99% 100%    Intake/Output Summary (Last 24 hours) at 01/29/2020 1951 Last data filed at 01/29/2020 1800 Gross per 24 hour  Intake 2264.73 ml  Output 1125 ml  Net 1139.73 ml     Laboratory CBC    Component Value Date/Time   WBC 9.9 01/29/2020 0417   HGB 8.8 (L) 01/29/2020 0417   HGB 12.7 (L) 07/29/2019 1004   HCT 24.9 (L) 01/29/2020 0417   HCT 38.3 07/29/2019 1004   PLT 243 01/29/2020 0417   PLT 191 07/29/2019 1004    BMET    Component Value Date/Time   NA 124 (L) 01/29/2020 0417   NA 132 (L) 12/10/2019 1409   K 3.7 01/29/2020 0417   CL 90 (L) 01/29/2020 0417   CO2 22 01/29/2020 0417   GLUCOSE 122 (H) 01/29/2020 0417   BUN 29 (H) 01/29/2020 0417   BUN 63 (H) 12/10/2019 1409   CREATININE 2.08 (H) 01/29/2020 0417   CREATININE 1.34 (H) 10/31/2016 0844   CALCIUM 8.0 (L) 01/29/2020 0417   GFRNONAA 30 (L) 01/29/2020 0417   GFRNONAA 46 (L) 10/24/2014 0943   GFRAA 35 (L) 01/29/2020 0417   GFRAA 53 (L) 10/24/2014 0943    COAG Lab Results  Component Value Date   INR 1.9 (H) 01/23/2020   INR 2.5 (H) 11/25/2019   INR 2.5 (H) 09/30/2019   No results found for: PTT  Antibiotics Anti-infectives (From admission, onward)   Start     Dose/Rate Route Frequency Ordered Stop   01/29/20 0000  ceFAZolin (ANCEF) IVPB 1 g/50 mL premix    Note to Pharmacy: Send with pt to OR   1 g 100 mL/hr over 30 Minutes Intravenous On call 01/28/20 2004 01/30/20 0000   01/26/20 1300  vancomycin (VANCOCIN) IVPB 1000 mg/200 mL premix     1,000 mg 200 mL/hr over 60 Minutes Intravenous Every 24 hours 01/26/20 1208     01/26/20 1300  ceFEPIme (MAXIPIME) 2 g in sodium chloride 0.9 % 100 mL IVPB     2 g 200 mL/hr over 30 Minutes Intravenous Every 12 hours 01/26/20 1208  01/24/20 0600  ceFAZolin (ANCEF) IVPB 2g/100 mL premix  Status:  Discontinued     2 g 200 mL/hr over 30 Minutes Intravenous To Short Stay 01/23/20 0745 01/23/20 2324   01/24/20 0600  ceFAZolin (ANCEF) IVPB 2g/100 mL premix  Status:  Discontinued     2 g 200 mL/hr over 30 Minutes Intravenous To Short Stay 01/23/20 2319 01/24/20 0932       V. Leia Alf, M.D., [facility] [place] Office: [phone] Pager:  [phone]

## 2020-01-29 NOTE — Progress Notes (Signed)
Pharmacy Antibiotic Note  Logan White is a 79 y.o. male admitted on 01/22/2020 for HF optimization prior to vascular surgery. He is now reporting symptoms concerning for COPD exacerbation. Pharmacy has been consulted for vancomycin and cefepime dosing.  Serum creatinine has been stable. WBC WNL. Patient is afebrile. Will obtain levels soon if antimicrobial therapy with vancomycin is to be continued, but can likely discontinue with negative MRSA PCR, clinical improvement, and last CXR only suggesting CHF with edema and cardiomegaly.  Plan: Continue vancomycin 1000 mg IV every 24 hours.  Goal trough 15-20 mcg/mL.  Continue cefepime 2 g IV q12h - watch Scr, borderline for dose adjustment Monitor renal function, cultures, and length of therapy  Height: 6\' 3"  (190.5 cm) Weight: 74.1 kg (163 lb 5.8 oz) IBW/kg (Calculated) : 84.5  Temp (24hrs), Avg:99.5 F (37.5 C), Min:98.1 F (36.7 C), Max:100.8 F (38.2 C)  Recent Labs  Lab 01/25/20 0432 01/25/20 0432 01/26/20 0348 01/27/20 0224 01/28/20 0344 01/28/20 2012 01/29/20 0417  WBC 11.0*  --  11.2* 10.8* 9.0  --  9.9  CREATININE 2.27*   < > 2.09* 2.24* 1.98* 1.98* 2.08*   < > = values in this interval not displayed.    Estimated Creatinine Clearance: 31.7 mL/min (A) (by C-G formula based on SCr of 2.08 mg/dL (H)).    No Known Allergies  Antimicrobials this admission: Vancomycin 5/23 >> Cefepime 5/23 >>  Dose adjustments this admission: None  Microbiology results: 5/23 BCx: no growth x3 days 5/23 Sputum: normal respiratory flora 5/20 MRSA PCR: neg  Thank you for allowing pharmacy to be a part of this patient's care.  Agnes Lawrence, PharmD PGY1 Pharmacy Resident  Please check [email or web address] for unit-specific pharmacist phone numbers

## 2020-01-29 NOTE — Progress Notes (Addendum)
Advanced Heart Failure Rounding Note  PCP-Cardiologist: Kirk Ruths, MD   Subjective:    Surgery cancelled on 5/20 due to shock. Initial co-ox 36%.   CO-OX 43% on milrinone 0.25 mcg   Yesterday diuresed with IV lasix. I/Os not accurate. No weight today.   Denies SOB. Denies chest pain.       Objective:   Weight Range: 72.2 kg Body mass index is 19.9 kg/m.   Vital Signs:   Temp:  [97.3 F (36.3 C)-100.8 F (38.2 C)] 99.6 F (37.6 C) (05/26 0338) Pulse Rate:  [45-128] 45 (05/26 0600) Resp:  [16-32] 18 (05/26 0600) BP: (93-162)/(54-109) 110/73 (05/26 0600) SpO2:  [91 %-100 %] 100 % (05/26 0600) Last BM Date: 01/28/20  Weight change: Filed Weights   01/26/20 0500 01/27/20 0500 01/28/20 0500  Weight: 74.7 kg 72 kg 72.2 kg    Intake/Output:   Intake/Output Summary (Last 24 hours) at 01/29/2020 0735 Last data filed at 01/29/2020 0600 Gross per 24 hour  Intake 1400.16 ml  Output 1525 ml  Net -124.84 ml      Physical Exam   CVP 14.  General:  No resp difficulty HEENT: normal Neck: supple. JVP 11-12 . Carotids 2+ bilat; no bruits. No lymphadenopathy or thryomegaly appreciated. RIJ  Cor: PMI nondisplaced. Irregular rate & rhythm. No rubs, gallops or murmurs. Lungs: EW Abdomen: soft, nontender, nondistended. No hepatosplenomegaly. No bruits or masses. Good bowel sounds. Extremities: no cyanosis, clubbing, rash, edema Neuro: alert & orientedx3, cranial nerves grossly intact. moves all 4 extremities w/o difficulty. Affect pleasant    Telemetry   A fib 100s with PVCs     Labs    CBC Recent Labs    01/28/20 0344 01/29/20 0417  WBC 9.0 9.9  HGB 8.9* 8.8*  HCT 25.5* 24.9*  MCV 72.9* 71.8*  PLT 221 702   Basic Metabolic Panel Recent Labs    01/28/20 2012 01/29/20 0417  NA 124* 124*  K 4.0 3.7  CL 89* 90*  CO2 22 22  GLUCOSE 151* 122*  BUN 27* 29*  CREATININE 1.98* 2.08*  CALCIUM 7.8* 8.0*   Liver Function Tests No results for  input(s): AST, ALT, ALKPHOS, BILITOT, PROT, ALBUMIN in the last 72 hours. No results for input(s): LIPASE, AMYLASE in the last 72 hours. Cardiac Enzymes No results for input(s): CKTOTAL, CKMB, CKMBINDEX, TROPONINI in the last 72 hours.  BNP: BNP (last 3 results) Recent Labs    11/25/19 1004 12/26/19 1515 01/22/20 1850  BNP 2,757.9* 1,843.7* 2,557.6*    ProBNP (last 3 results) Recent Labs    08/15/19 0949  PROBNP 16,511*     D-Dimer No results for input(s): DDIMER in the last 72 hours. Hemoglobin A1C No results for input(s): HGBA1C in the last 72 hours. Fasting Lipid Panel No results for input(s): CHOL, HDL, LDLCALC, TRIG, CHOLHDL, LDLDIRECT in the last 72 hours. Thyroid Function Tests No results for input(s): TSH, T4TOTAL, T3FREE, THYROIDAB in the last 72 hours.  Invalid input(s): FREET3  Other results:   Imaging    No results found.   Medications:     Scheduled Medications: . atorvastatin  80 mg Oral Daily  . Chlorhexidine Gluconate Cloth  6 each Topical Daily  . feeding supplement (ENSURE ENLIVE)  237 mL Oral Daily  . ferrous sulfate  325 mg Oral BID WC  . gabapentin  300 mg Oral QHS  . hydrALAZINE  10 mg Oral BID  . isosorbide mononitrate  30 mg Oral Daily  .  mouth rinse  15 mL Mouth Rinse BID  . pantoprazole  40 mg Oral BID  . sodium chloride flush  10-40 mL Intracatheter Q12H  . spironolactone  25 mg Oral Daily    Infusions: . sodium chloride    . amiodarone 30 mg/hr (01/29/20 0600)  .  ceFAZolin (ANCEF) IV    . ceFEPime (MAXIPIME) IV 2 g (01/28/20 2205)  . heparin 1,200 Units/hr (01/29/20 0600)  . milrinone 0.25 mcg/kg/min (01/29/20 0600)  . vancomycin Stopped (01/28/20 1402)    PRN Medications: sodium chloride, acetaminophen, albuterol, ondansetron (ZOFRAN) IV, sodium chloride, sodium chloride flush, zolpidem    Assessment/Plan    1. Acute on chronic Biventricular CHF  -> cardiogenic shock - known ICM dating back to 2013.  - Echo  1/21showed LVEF 25-30%, global hypokinesis, mild LVH, RV mildly enlarged w/ severely reduced systolic function. Moderate MR. Mild TR. Severe bi atrial enlargement. - RHC on 3/25 showed elevated biventricular filling pressures and with equalization of R &L sided diastolic pressuresand preserved CO consistent withrestrictive physiology. - Developed cardiogenic shock on 5/20 with initial co-ox 36%.  - Hemodynamics markedly  improved on milrinone 0.25.  - CO-OX 44%. Repeat now. Continue milrinone support through OR.  - Continue hydralazine/nitrates & spiro -  CVP 14. Today.  - Diurese after surgery.  - No ARB/ARNI with CKD IV - No b-blocker with shock  2 PAD w/ Claudication  - knownR external iliac artery occlusion with severe infrainguinal arterial occlusive disease and peroneal runoff only. L SFA occlusion with diffuse infrainguinal arterial occlusive disease - Dr. Trula Slade plans on a left iliac stent +/- bilateral femoral endarterectomies, and a left to right femorofemoral bypass - This is likely as good as he is going to get from a CV standpoint but need pulmonary toilet for possible COPD flare prior to going to OR - Dr Haroldine Laws coordinating with vascular team - Planning for OR today.   3. Fever/productive cough - ? COPD flare -- Sputum GS with MOD GNR - on vanc/cefipime - continue nebs  4. CKD IV:  - Creatinine baseline ~2.5 - Creatinine stable today.   - Continue milrinone.   5 CAD:  - prior LAD and LCx stenting - no s/s angina - no ASA due to apixaban - continue high dose statin   6.Chronic AF - Rate up with addition of milrinon - Off b-blocker due to shock.  - Start amio for rate control while on milrinone.  - now on  IV heparin per pharmacy   7. Hyponatremia - Na has been low.  -  improved with tovlaptan and hemodynamic support  8. Hypokalemia - K 3.7    9. Anemia  Hgb 8.8 . No obvious source.  Iron sats low. Will give feraheme.    Length of Stay:  Villal[place], NP  01/29/2020, 7:35 AM  Advanced Heart Failure Team Pager [phone] (M-F; 7a - 4p)  Please contact [facility] for night-coverage after hours (4p -7a ) and weekends on [email or web address]  Agree with above.   Says he feel fine but co-ox was low this am and CVP up despite milrinone support. Co-ox improved to 56% on repeat. Respiratory status is improved. Creatinine stable.  Able to stand up for weight.   General:  Frail appearing. No resp difficulty HEENT: normal Neck: supple. RIJ TLC. JVP up Carotids 2+ bilat; no bruits. No lymphadenopathy or thryomegaly appreciated. Cor: PMI nondisplaced. irregular tachy  No rubs, gallops or murmurs. Lungs: coarse Abdomen: soft, nontender,  nondistended. No hepatosplenomegaly. No bruits or masses. Good bowel sounds. Extremities: no cyanosis, clubbing, rash, edema + wounds Neuro: alert & orientedx3, cranial nerves grossly intact. moves all 4 extremities w/o difficulty. Affect pleasant  He remains very tenuous but I suspect this will be as good as it gets. I have discussed his situation extensively with him as well as the anesthesia and VVS teams. I quoted him at least a 10% chace of CV/Renal complications. He now says that he wants to cancel surgery (he has never said this to me before). I said there was a good chance that if he doesn't have surgery he will become a double amputee in the next 6 months. He will d/w Dr. Trula Slade.   CRITICAL CARE Performed by: Glori Bickers  Total critical care time: 35 minutes  Critical care time was exclusive of separately billable procedures and treating other patients.  Critical care was necessary to treat or prevent imminent or life-threatening deterioration.  Critical care was time spent personally by me (independent of midlevel providers or residents) on the following activities: development of treatment plan with patient and/or surrogate as well as nursing, discussions with consultants, evaluation of  patient's response to treatment, examination of patient, obtaining history from patient or surrogate, ordering and performing treatments and interventions, ordering and review of laboratory studies, ordering and review of radiographic studies, pulse oximetry and re-evaluation of patient's condition.  Glori Bickers, MD  8:33 AM

## 2020-01-29 NOTE — Anesthesia Preprocedure Evaluation (Addendum)
Anesthesia Evaluation    Airway        Dental   Pulmonary COPD,  COPD inhaler, former smoker,  01/21/2020 SARS coronavirus NEG          Cardiovascular hypertension, Pt. on medications and Pt. on home beta blockers + CAD, + Cardiac Stents, + Peripheral Vascular Disease and +CHF (on milrinone)  + dysrhythmias (amiodarone) Atrial Fibrillation and Ventricular Fibrillation   01/24/2020 R cath: Marked volume overload with improved cardiac output on milrinone Plan/Discussion:  Leave swan in. Continue milrinone and diuresis.   Echo 1/21 showed LVEF 25-30%, global hypokinesis, mild LVH, RV mildly enlarged w/ severely reduced systolic function. Moderate MR. Mild TR. Severe bi atrial enlargement   Neuro/Psych CVA    GI/Hepatic GERD  Medicated,  Endo/Other  diabetes (glu 122)  Renal/GU Renal InsufficiencyRenal disease (creat 2.08)     Musculoskeletal   Abdominal   Peds  Hematology  (+) Blood dyscrasia (Hb 8.8), , eliquis   Anesthesia Other Findings   Reproductive/Obstetrics                            Anesthesia Physical Anesthesia Plan  ASA: IV  Anesthesia Plan: General   Post-op Pain Management:    Induction: Intravenous  PONV Risk Score and Plan: 2 and Ondansetron and Dexamethasone  Airway Management Planned: Oral ETT  Additional Equipment: Arterial line  Intra-op Plan:   Post-operative Plan:   Informed Consent:   Plan Discussed with:   Anesthesia Plan Comments:         Anesthesia Quick Evaluation

## 2020-01-29 NOTE — Progress Notes (Signed)
Physical Therapy Treatment Patient Details Name: Logan White MRN: [id number] DOB: May 24, 1941 Today's Date: 01/29/2020    History of Present Illness Pt is a 79 y/o male s/p R heart cath 01/24/20. Surgery cancelled 5/20 due to shock. Pt declined surgery 01/29/20 for left common iliac artery stent with left to right femorofemoral bypass. PMHCA, Afib, PVD, hyperlipidemia, anemia, HTN, CVA, DM, CHF, CKD, AAA, cardiac arrest-vfib.    PT Comments    Pt declined amb today due to increased pain in L LE. Pt agreed to try therex at EOB and transfer to recliner. Pt educated on importance of sitting in recliner and trying to stay out of bed. Pt able to complete bed mobility supervision, sit to stand and transfer to chair min guard without assistive device and assistance for line management. Attempted LE therex, pt reported too painful to complete. Will continue to follow acutely until d/c to next venue of care.     Follow Up Recommendations  Home health PT     Equipment Recommendations  None recommended by PT    Recommendations for Other Services       Precautions / Restrictions Precautions Precautions: Fall Restrictions Weight Bearing Restrictions: No    Mobility  Bed Mobility Overal bed mobility: Needs Assistance Bed Mobility: Supine to Sit     Supine to sit: Supervision     General bed mobility comments: for lines and safety, HOB elevated   Transfers Overall transfer level: Needs assistance Equipment used: None Transfers: Stand Pivot Transfers Sit to Stand: Min guard Stand pivot transfers: Min guard       General transfer comment: Pt required min guarf assistance to sit to stand, pt insisted on not using RW and was able to stand pivot over to the recliner with assistance for lines.  Ambulation/Gait             General Gait Details: declined amb due to LE pain   Stairs             Wheelchair Mobility    Modified Rankin (Stroke Patients Only)        Balance Overall balance assessment: Needs assistance Sitting-balance support: Feet supported Sitting balance-Leahy Scale: Good       Standing balance-Leahy Scale: Poor Standing balance comment: Pt declined use of walker, pt able to stand and get over to chair with min guard, at end of stand pivot pt exhbited poor eccentric control decending to arm rest before sitting on cushion of chair.                            Cognition Arousal/Alertness: Awake/alert Behavior During Therapy: WFL for tasks assessed/performed Overall Cognitive Status: Within Functional Limits for tasks assessed                                 General Comments: slightly HOH but overall following commands and answering questions well       Exercises Total Joint Exercises Ankle Circles/Pumps: AROM;10 reps;Both;Seated Long Arc Quad: AROM;Left;Other reps (comment)(1x3 limited due to pain)    General Comments General comments (skin integrity, edema, etc.): Pt required coaxing to complete physical therapy, pt reports LE in pain and declined amb due to pain.      Pertinent Vitals/Pain Pain Assessment: 0-10 Pain Score: 7  Pain Location: L LE Pain Descriptors / Indicators: Grimacing;Other (Comment);Moaning("hurt") Pain Intervention(s): Limited activity within patient's tolerance;Monitored  during session;Repositioned    Home Living                      Prior Function            PT Goals (current goals can now be found in the care plan section) Acute Rehab PT Goals Patient Stated Goal: home PT Goal Formulation: With patient Time For Goal Achievement: 02/11/20 Potential to Achieve Goals: Good Progress towards PT goals: Progressing toward goals    Frequency    Min 3X/week      PT Plan Current plan remains appropriate    Co-evaluation              AM-PAC PT "6 Clicks" Mobility   Outcome Measure  Help needed turning from your back to your side while in a flat  bed without using bedrails?: None Help needed moving from lying on your back to sitting on the side of a flat bed without using bedrails?: None Help needed moving to and from a bed to a chair (including a wheelchair)?: A Little Help needed standing up from a chair using your arms (e.g., wheelchair or bedside chair)?: A Little Help needed to walk in hospital room?: A Little Help needed climbing 3-5 steps with a railing? : A Lot 6 Click Score: 19    End of Session Equipment Utilized During Treatment: Gait belt Activity Tolerance: Patient limited by pain Patient left: in chair;with call bell/phone within reach;with chair alarm set Nurse Communication: Mobility status PT Visit Diagnosis: Unsteadiness on feet (R26.81);Muscle weakness (generalized) (M62.81);Difficulty in walking, not elsewhere classified (R26.2)     Time: 5300-5110 PT Time Calculation (min) (ACUTE ONLY): 28 min  Charges:  $Therapeutic Activity: 23-37 mins                     [employer] SPT 01/29/2020    Rolland Porter 01/29/2020, 12:15 PM

## 2020-01-29 NOTE — Progress Notes (Signed)
ANTICOAGULATION CONSULT NOTE  Pharmacy Consult for Heparin Indication: atrial fibrillation  Patient Measurements: Height: 6\' 3"  (190.5 cm) Weight: 74.1 kg (163 lb 5.8 oz) IBW/kg (Calculated) : 84.5 Heparin Dosing Weight: 73.5kg  Vital Signs: Temp: 98.1 F (36.7 C) (05/26 1119) Temp Source: Oral (05/26 1119) BP: 106/93 (05/26 1040) Pulse Rate: 103 (05/26 1040)  Labs: Recent Labs    01/27/20 0224 01/27/20 0224 01/28/20 0344 01/28/20 2012 01/29/20 0417  HGB 9.0*   < > 8.9*  --  8.8*  HCT 26.0*  --  25.5*  --  24.9*  PLT 224  --  221  --  243  HEPARINUNFRC 0.41  --  0.28*  --  0.41  CREATININE 2.24*   < > 1.98* 1.98* 2.08*   < > = values in this interval not displayed.    Estimated Creatinine Clearance: 31.7 mL/min (A) (by C-G formula based on SCr of 2.08 mg/dL (H)).  Medical History: Past Medical History:  Diagnosis Date  . AAA (abdominal aortic aneurysm) ([place])    a. 08/2015 Abd U/S: 2.7 x 2.9 x 3.2 cm infrarenal AAA.  . Bradycardia    a. Requiring discontinuation of use of BB/CCB  . Cardiac arrest - ventricular fibrillation    a. 03/2012 in setting of hypokalemia (prolonged hosp with VDRF, tracheobronchitis, ARF, shock liver, PAF, AMS felt secondary to post-anoxic encephalopathy/shock).  . Carotid artery stenosis    a. 01/2011 - 40-59% bilateral stenosis;  b. 06/2015 Carotid U/S: 40-59% bilat ICA stenosis->f/u 6 mos.  . CHF (congestive heart failure) ([place])   . CKD (chronic kidney disease), stage III   . Coronary artery disease    a. s/p aborted ant STEMI tx with Cypher DES to LAD 10/04 (residual at cath: D1 50%, CFX 40% and multiple dist 70%, EF 55%);   b. myoview 3/10: Ef 47%, infero-apical isch, LOW RISK - med Tx recommended;  c. 12/2012 VF Arrest/Cath: LAD 40isr, 80 apical, LCX 100 (failed PTCA), RCA nondom.  . Diabetes mellitus   . Diverticulosis 2001  . Elevated LFTs    Shock liver 03/2012  . H/O: CVA (cardiovascular accident)   . Hematemesis    a. 12/2010 felt  2/2 Mallory Weiss tear - pt could not afford colonscopy/EGD at that time so Coumadin was deferred. Coumadin initiated 03/2012 without any evidence for bleeding.  Marland Kitchen History of Ischemic Cardiomyopathy    a. EF 50-55% by echo 03/09/12 (was 30% by echo 02/24/12); b. 03/2013 Echo: EF 55-60%, mild MR, sev dil LA, mod dil RA, PASP 78mmHg.  Marland Kitchen Hypertensive heart disease    a. echo 4/12: EF 50%, asymmetric septal hypertrophy, no SAM or LVOT gradient, LAE, PASP 35  . Inguinal hernia   . 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 Iron deficiency anemia   . Mitral regurgitation    a. Mild by echo 03/2012 and 03/2013.  . Mixed Hyperlipidemia   . Peripheral vascular disease ([place])    a. h/o LE angioplasty;  b. 08/2015 Duplex: >50% RCIA, 100% REIA, >50% LEIA, elev vel in SMA, patent IVC.  Marland Kitchen Persistent atrial fibrillation ([place])    a. Dx 12/2010-->coumadin (CHA2DS2VASc = 7).  . QT prolongation   . Tubular adenoma of colon 11/2019   Assessment: [name] starting on heparin for afib (CHADS2VASc = 6). PTA Apixaban on hold for planned vascular surgery 5/21. Last dose 5/19 AM.  Patient has been more anemic (Hgb < 11) since March 2021, after undergoing fem-fem bypass.  Pharmacy  consulted to dose heparin while apixaban is held.   Heparin level is therapeutic this morning at 0.41 on heparin drip 1200 units/hr. CBC stable. No active bleed issues reported.   Goal of Therapy:  Heparin level 0.3-0.7 units/ml  Monitor platelets by anticoagulation protocol: Yes   Plan:  Continue heparin infusion at 1200 units/hr Monitor daily heparin level and CBC, s/sx bleeding F/u plans to switch back to oral anticoagulation post surgery (if patient agreeable to surgery)  Agnes Lawrence, PharmD PGY1 Pharmacy Resident   Please check [email or web address] for unit-specific pharmacist phone numbers

## 2020-01-29 NOTE — Progress Notes (Signed)
Patient refusing surgery at this time. Report called to [place], floor rn. Patient transported back to floor.

## 2020-01-30 LAB — CBC
HCT: 25.4 % — ABNORMAL LOW (ref 39.0–52.0)
Hemoglobin: 8.7 g/dL — ABNORMAL LOW (ref 13.0–17.0)
MCH: 24.6 pg — ABNORMAL LOW (ref 26.0–34.0)
MCHC: 34.3 g/dL (ref 30.0–36.0)
MCV: 72 fL — ABNORMAL LOW (ref 80.0–100.0)
Platelets: 225 10*3/uL (ref 150–400)
RBC: 3.53 MIL/uL — ABNORMAL LOW (ref 4.22–5.81)
RDW: 17.7 % — ABNORMAL HIGH (ref 11.5–15.5)
WBC: 10.1 10*3/uL (ref 4.0–10.5)
nRBC: 0.2 % (ref 0.0–0.2)

## 2020-01-30 LAB — BASIC METABOLIC PANEL
Anion gap: 12 (ref 5–15)
BUN: 35 mg/dL — ABNORMAL HIGH (ref 8–23)
CO2: 22 mmol/L (ref 22–32)
Calcium: 8.8 mg/dL — ABNORMAL LOW (ref 8.9–10.3)
Chloride: 91 mmol/L — ABNORMAL LOW (ref 98–111)
Creatinine, Ser: 2.43 mg/dL — ABNORMAL HIGH (ref 0.61–1.24)
GFR calc Af Amer: 29 mL/min — ABNORMAL LOW (ref >60)
GFR calc non Af Amer: 25 mL/min — ABNORMAL LOW (ref >60)
Glucose, Bld: 121 mg/dL — ABNORMAL HIGH (ref 70–99)
Potassium: 3.7 mmol/L (ref 3.5–5.1)
Sodium: 125 mmol/L — ABNORMAL LOW (ref 135–145)

## 2020-01-30 LAB — HEPARIN LEVEL (UNFRACTIONATED): Heparin Unfractionated: 0.29 IU/mL — ABNORMAL LOW (ref 0.30–0.70)

## 2020-01-30 LAB — COOXEMETRY PANEL
Carboxyhemoglobin: 1.3 % (ref 0.5–1.5)
Methemoglobin: 0.8 % (ref 0.0–1.5)
O2 Saturation: 54.3 %
Total hemoglobin: 8.7 g/dL — ABNORMAL LOW (ref 12.0–16.0)

## 2020-01-30 MED ORDER — APIXABAN 5 MG PO TABS
5.0000 mg | ORAL_TABLET | Freq: Two times a day (BID) | ORAL | Status: DC
  Administered 2020-01-30: 5 mg via ORAL
  Filled 2020-01-30: qty 1

## 2020-01-30 MED ORDER — METOPROLOL SUCCINATE ER 25 MG PO TB24
12.5000 mg | ORAL_TABLET | Freq: Every day | ORAL | Status: DC
  Administered 2020-01-30: 12.5 mg via ORAL
  Filled 2020-01-30: qty 1

## 2020-01-30 MED ORDER — ENSURE ENLIVE PO LIQD: 237.0000 mL | Freq: Two times a day (BID) | ORAL | Status: DC

## 2020-01-30 NOTE — Progress Notes (Signed)
Pt going home with wife with belongings. Discussed and explained discharge instructions to patient, no new medication, made aware of follow up visits.

## 2020-01-30 NOTE — Progress Notes (Signed)
Occupational Therapy Treatment Patient Details Name: Logan White MRN: [id number] DOB: 1940-11-02 Today's Date: 01/30/2020    History of present illness Pt is a 79 y/o male s/p R heart cath 01/24/20. Surgery cancelled 5/20 due to shock. Pt declined surgery 01/29/20 for left common iliac artery stent with left to right femorofemoral bypass. PMHCA, Afib, PVD, hyperlipidemia, anemia, HTN, CVA, DM, CHF, CKD, AAA, cardiac arrest-vfib.   OT comments  Pt seen for OT follow up with focus on BADL mobility progression and home safety in anticipation of d/c. Pt completed BSC transfer at min guard assist and functional mobility with min A. Pt did have one LOB With functional mobility requiring min A to correct and remains a fall risk. Educated pt on fall prevention strategies for the home and reinforced appropriate DME usage in the home environment. Pt was on 3L [place] with VSS this session and baseline is on 2L. D/c recs remain appropriate as listed below, will continue to follow.    Follow Up Recommendations  Home health OT;Supervision/Assistance - 24 hour    Equipment Recommendations  3 in 1 bedside commode    Recommendations for Other Services      Precautions / Restrictions Precautions Precautions: Fall Restrictions Weight Bearing Restrictions: No       Mobility Bed Mobility               General bed mobility comments: up about to t/f to [facility] with NT on arrival  Transfers Overall transfer level: Needs assistance Equipment used: 1 person hand held assist;Rolling walker (2 wheeled) Transfers: Stand Pivot Transfers;Sit to/from Stand Sit to Stand: Min guard Stand pivot transfers: Min assist       General transfer comment: min guard to rise and steady, min A to maintain balance once beginning transfer. Pt had one LOB requiring min A for correction after transferring from [place] Overall balance assessment: Needs assistance Sitting-balance support: Feet supported Sitting  balance-Leahy Scale: Good     Standing balance support: Bilateral upper extremity supported Standing balance-Leahy Scale: Poor Standing balance comment: reliant on external precautions                           ADL either performed or assessed with clinical judgement   ADL Overall ADL's : Needs assistance/impaired     Grooming: Set up;Sitting                   Toilet Transfer: Minimal assistance;RW;Ambulation Toilet Transfer Details (indicate cue type and reason): to [facility], min A to steady self with one person HHA Toileting- Clothing Manipulation and Hygiene: Minimal assistance;Sit to/from stand Toileting - Clothing Manipulation Details (indicate cue type and reason): assist to steady and to reach back to peri area for thoroughness     Functional mobility during ADLs: Minimal assistance;Rolling walker;Cueing for safety       Vision Patient Visual Report: No change from baseline     Perception     Praxis      Cognition Arousal/Alertness: Awake/alert Behavior During Therapy: WFL for tasks assessed/performed Overall Cognitive Status: Within Functional Limits for tasks assessed                                          Exercises     Shoulder Instructions       General Comments  Pertinent Vitals/ Pain       Pain Assessment: Faces Faces Pain Scale: Hurts a little bit Pain Location: L LE Pain Descriptors / Indicators: Aching;Sore Pain Intervention(s): Monitored during session;Repositioned  Home Living                                          Prior Functioning/Environment              Frequency  Min 2X/week        Progress Toward Goals  OT Goals(current goals can now be found in the care plan section)  Progress towards OT goals: Progressing toward goals  Acute Rehab OT Goals Patient Stated Goal: home OT Goal Formulation: With patient Time For Goal Achievement: 02/11/20 Potential to Achieve  Goals: Good  Plan Discharge plan remains appropriate    Co-evaluation                 AM-PAC OT "6 Clicks" Daily Activity     Outcome Measure   Help from another person eating meals?: None Help from another person taking care of personal grooming?: A Little Help from another person toileting, which includes using toliet, bedpan, or urinal?: A Little Help from another person bathing (including washing, rinsing, drying)?: A Little Help from another person to put on and taking off regular upper body clothing?: A Little Help from another person to put on and taking off regular lower body clothing?: A Little 6 Click Score: 19    End of Session Equipment Utilized During Treatment: Gait belt;Rolling walker  OT Visit Diagnosis: Muscle weakness (generalized) (M62.81);Unsteadiness on feet (R26.81)   Activity Tolerance Patient tolerated treatment well   Patient Left in chair;with call bell/phone within reach;with chair alarm set   Nurse Communication Mobility status        Time: 4315-4008 OT Time Calculation (min): 13 min  Charges: OT General Charges $OT Visit: 1 Visit OT Treatments $Self Care/Home Management : 8-22 mins  Zenovia Jarred, MSOT, OTR/L [facility] Office Number: [phone] Pager: [phone]  Zenovia Jarred 01/30/2020, 4:05 PM

## 2020-01-30 NOTE — TOC Initial Note (Addendum)
Transition of Care [facility]) - Initial/Assessment Note    Patient Details  Name: Logan White MRN: [id number] Date of Birth: Mar 07, 1941  [facility] CM/SW Contact:    Zenon Mayo, RN Phone Number: 01/30/2020, 4:29 PM  Clinical Narrative:                 Patient for dc today, he is active with [facility] for [facility] [place] informed [place] that he is for dc today and faxed orders over to her since epic is down for her. Soc will begin 24 to 48 hrs post dc.  Expected Discharge Plan: [place] Barriers to Discharge: No Barriers Identified   Patient Goals and CMS Choice Patient states their goals for this hospitalization and ongoing recovery are:: get better      Expected Discharge Plan and Services Expected Discharge Plan: [place] In-house Referral: NA Discharge Planning Services: CM Consult Post Acute Care Choice: Resumption of Svcs/PTA Provider Living arrangements for the past 2 months: Single Family Home Expected Discharge Date: 01/30/20                         HH Arranged: RN, Disease Management HH Agency: [facility] Date [place] Agency Contacted: 01/30/20 Time [place]: 1628 Representative spoke with at [place]: [place] Arrangements/Services Living arrangements for the past 2 months: [place] with:: Spouse Patient language and need for interpreter reviewed:: Yes Do you feel safe going back to the place where you live?: Yes      Need for Family Participation in Patient Care: Yes (Comment) Care giver support system in place?: Yes (comment) Current home services: DME(Patient has a cane) Criminal Activity/Legal Involvement Pertinent to Current Situation/Hospitalization: No - Comment as needed  Activities of Daily Living Home Assistive Devices/Equipment: Cane (specify quad or straight) ADL Screening (condition at time of admission) Patient's cognitive ability  adequate to safely complete daily activities?: Yes Is the patient deaf or have difficulty hearing?: No Does the patient have difficulty seeing, even when wearing glasses/contacts?: No Does the patient have difficulty concentrating, remembering, or making decisions?: No Patient able to express need for assistance with ADLs?: Yes Does the patient have difficulty dressing or bathing?: No Independently performs ADLs?: Yes (appropriate for developmental age) Does the patient have difficulty walking or climbing stairs?: Yes Weakness of Legs: Both Weakness of Arms/Hands: None  Permission Sought/Granted Permission sought to share information with : Family Supports, Chartered certified accountant granted to share information with : Yes, Verbal Permission Granted     Permission granted to share info w AGENCY: [place]        Emotional Assessment Appearance:: Appears stated age Attitude/Demeanor/Rapport: Engaged Affect (typically observed): Appropriate Orientation: : Oriented to Self, Oriented to Place, Oriented to  Time, Oriented to Situation Alcohol / Substance Use: Not Applicable Psych Involvement: No (comment)  Admission diagnosis:  Acute on chronic systolic CHF (congestive heart failure), NYHA class 3 ([place]) [I50.23] Patient Active Problem List   Diagnosis Date Noted  . Acute on chronic systolic CHF (congestive heart failure), NYHA class 3 ([place]) 01/22/2020  . Acute on chronic combined systolic and diastolic CHF (congestive heart failure) ([place]) 11/25/2019  . Palliative care by specialist   . Goals of care, counseling/discussion   . PVD (peripheral vascular disease) ([place]) 11/15/2019  . Gastritis and gastroduodenitis   . Benign neoplasm of transverse colon   . Microcytic anemia   .  Acute blood loss anemia   . Melena   . CHF (congestive heart failure) ([place]) 11/04/2019  . CHF (congestive heart failure), NYHA class IV, acute, systolic ([place]) 59/56/3875  . Persistent atrial  fibrillation ([place])   . Acute on chronic systolic CHF (congestive heart failure) ([place]) 06/22/2019  . Acute on chronic systolic (congestive) heart failure ([place]) 06/22/2019  . CAD S/P PCI 10/13/2015  . Cardiomyopathy, new. Etiology apeears to be NICM 10/13/2015  . AAA (abdominal aortic aneurysm) ([place])   . Peripheral vascular disease ([place])   . Carotid artery stenosis   . Hypertensive heart disease   . Bruit 10/24/2014  . Acute on chronic renal failure ([place]) 01/11/2013  . Acute MI, anterolateral wall, initial episode of care ([place]) 12/31/2012  . Cardiac arrest due to underlying cardiac condition ([place]) 12/31/2012  . VF (ventricular fibrillation) ([place]) 12/31/2012  . Abnormal LFTs 12/31/2012  . Acute on chronic renal insufficiency 12/31/2012  . Chronic anticoagulation 12/31/2012  . Hypokalemia 12/31/2012  . Weakness 04/23/2012  . Long term (current) use of anticoagulants 03/19/2012  . CKD (chronic kidney disease), stage III 03/16/2012  . Acute confusion/delerium 03/12/2012  . Delirium 03/12/2012  . Acute systolic CHF (congestive heart failure) ([place]) 03/11/2012  . Septic shock(785.52) 02/27/2012    Class: Acute  . Cardiogenic shock ([place]) 02/25/2012  . Acute renal failure ([place]) 02/25/2012  . Anoxic encephalopathy ([place]) 02/25/2012  . Ischemic cardiomyopathy 02/25/2012  . Cardiac arrest ([place]) 02/23/2012  . Acute respiratory failure with hypoxia ([place]) 02/23/2012  . Ventricular fibrillation ([place]) 02/23/2012  . DM (diabetes mellitus) ([place]) 02/23/2012  . Screening for colon cancer 02/10/2011  . Hematemesis 01/13/2011  . Dyspnea 01/13/2011  . RENAL INSUFFICIENCY 08/26/2010  . HYPERPOTASSEMIA 09/15/2009  . TOBACCO ABUSE 08/11/2009  . GLUCOSE INTOLERANCE 10/02/2008  . Hyperlipidemia 10/02/2008  . Essential hypertension 10/02/2008  . Cerebrovascular disease 10/02/2008  . PERIPHERAL VASCULAR DISEASE 10/02/2008  . INGUINAL HERNIA 10/02/2008   PCP:  Patient, No Pcp Per Pharmacy:   [place], [place] [address] Phone: [phone] Fax: [phone]  [id number] - [place], [place] [address] Phone: [phone] Fax: [phone]  [facility] [address] [place], [place] [address] Phone: [phone] Fax: [phone]     Social Determinants of Health (SDOH) Interventions    Readmission Risk Interventions Readmission Risk Prevention Plan 01/30/2020 01/24/2020 12/04/2019  Transportation Screening Complete Complete Complete  PCP or Specialist Appt within 3-5 Days - - -  HRI or [place] for [place] - - -  Medication Review Press photographer) Complete Complete Complete  PCP or Specialist appointment within 3-5 days of discharge - Complete Complete  HRI or [place] Complete Complete Complete  SW Recovery Care/Counseling Consult Complete Complete Complete  Palliative Care Screening Not Applicable Not Applicable Not [place] Not Applicable Complete Not Applicable  Some recent data might be hidden

## 2020-01-30 NOTE — Discharge Instructions (Signed)
Heart Failure, Diagnosis  Heart failure means that your heart is not able to pump blood in the right way. This makes it hard for your body to work well. Heart failure is usually a long-term (chronic) condition. You must take good care of yourself and follow your treatment plan from your doctor. What are the causes? This condition may be caused by:  High blood pressure.  Build up of cholesterol and fat in the arteries.  Heart attack. This injures the heart muscle.  Heart valves that do not open and close properly.  Damage of the heart muscle. This is also called cardiomyopathy.  Lung disease.  Abnormal heart rhythms. What increases the risk? The risk of heart failure goes up as a person ages. This condition is also more likely to develop in people who:  Are overweight.  Are male.  Smoke or chew tobacco.  Abuse alcohol or illegal drugs.  Have taken medicines that can damage the heart.  Have diabetes.  Have abnormal heart rhythms.  Have thyroid problems.  Have low blood counts (anemia). What are the signs or symptoms? Symptoms of this condition include:  Shortness of breath.  Coughing.  Swelling of the feet, ankles, legs, or belly.  Losing weight for no reason.  Trouble breathing.  Waking from sleep because of the need to sit up and get more air.  Rapid heartbeat.  Being very tired.  Feeling dizzy, or feeling like you may pass out (faint).  Having no desire to eat.  Feeling like you may vomit (nauseous).  Peeing (urinating) more at night.  Feeling confused. How is this treated?     This condition may be treated with:  Medicines. These can be given to treat blood pressure and to make the heart muscles stronger.  Changes in your daily life. These may include eating a healthy diet, staying at a healthy body weight, quitting tobacco and illegal drug use, or doing exercises.  Surgery. Surgery can be done to open blocked valves, or to put devices in  the heart, such as pacemakers.  A donor heart (heart transplant). You will receive a healthy heart from a donor. Follow these instructions at home:  Treat other conditions as told by your doctor. These may include high blood pressure, diabetes, thyroid disease, or abnormal heart rhythms.  Learn as much as you can about heart failure.  Get support as you need it.  Keep all follow-up visits as told by your doctor. This is important. Summary  Heart failure means that your heart is not able to pump blood in the right way.  This condition is caused by high blood pressure, heart attack, or damage of the heart muscle.  Symptoms of this condition include shortness of breath and swelling of the feet, ankles, legs, or belly. You may also feel very tired or feel like you may vomit.  You may be treated with medicines, surgery, or changes in your daily life.  Treat other health conditions as told by your doctor. This information is not intended to replace advice given to you by your health care provider. Make sure you discuss any questions you have with your health care provider. Document Revised: 11/09/2018 Document Reviewed: 11/09/2018 Elsevier Patient Education  [place].   Heart Failure Eating Plan Heart failure, also called congestive heart failure, occurs when your heart does not pump blood well enough to meet your body's needs for oxygen-rich blood. Heart failure is a long-term (chronic) condition. Living with heart failure can be  challenging. However, following your health care provider's instructions about a healthy lifestyle and working with a diet and nutrition specialist (dietitian) to choose the right foods may help to improve your symptoms. What are tips for following this plan? Reading food labels  Check food labels for the amount of sodium per serving. Choose foods that have less than 140 mg (milligrams) of sodium in each serving.  Check food labels for the number of  calories per serving. This is important if you need to limit your daily calorie intake to lose weight.  Check food labels for the serving size. If you eat more than one serving, you will be eating more sodium and calories than what is listed on the label.  Look for foods that are labeled as "sodium-free," "very low sodium," or "low sodium." ? Foods labeled as "reduced sodium" or "lightly salted" may still have more sodium than what is recommended for you. Cooking  Avoid adding salt when cooking. Ask your health care provider or dietitian before using salt substitutes.  Season food with salt-free seasonings, spices, or herbs. Check the label of seasoning mixes to make sure they do not contain salt.  Cook with heart-healthy oils, such as olive, canola, soybean, or sunflower oil.  Do not fry foods. Cook foods using low-fat methods, such as baking, boiling, grilling, and broiling.  Limit unhealthy fats when cooking by: ? Removing the skin from poultry, such as chicken. ? Removing all visible fats from meats. ? Skimming the fat off from stews, soups, and gravies before serving them. Meal planning   Limit your intake of: ? Processed, canned, or pre-packaged foods. ? Foods that are high in trans fat, such as fried foods. ? Sweets, desserts, sugary drinks, and other foods with added sugar. ? Full-fat dairy products, such as whole milk.  Eat a balanced diet that includes: ? 4-5 servings of fruit each day and 4-5 servings of vegetables each day. At each meal, try to fill half of your plate with fruits and vegetables. ? Up to 6-8 servings of whole grains each day. ? Up to 2 servings of lean meat, poultry, or fish each day. One serving of meat is equal to 3 oz. This is about the same size as a deck of cards. ? 2 servings of low-fat dairy each day. ? Heart-healthy fats. Healthy fats called omega-3 fatty acids are found in foods such as flaxseed and cold-water fish like sardines, salmon, and  mackerel.  Aim to eat 25-35 g (grams) of fiber a day. Foods that are high in fiber include apples, broccoli, carrots, beans, peas, and whole grains.  Do not add salt or condiments that contain salt (such as soy sauce) to foods before eating.  When eating at a restaurant, ask that your food be prepared with less salt or no salt, if possible.  Try to eat 2 or more vegetarian meals each week.  Eat more home-cooked food and eat less restaurant, buffet, and fast food. General information  Do not eat more than 2,300 mg of salt (sodium) a day. The amount of sodium that is recommended for you may be lower, depending on your condition.  Maintain a healthy body weight as directed. Ask your health care provider what a healthy weight is for you. ? Check your weight every day. ? Work with your health care provider and dietitian to make a plan that is right for you to lose weight or maintain your current weight.  Limit how much fluid you drink.  Ask your health care provider or dietitian how much fluid you can have each day.  Limit or avoid alcohol as told by your health care provider or dietitian. Recommended foods The items listed may not be a complete list. Talk with your dietitian about what dietary choices are best for you. Fruits All fresh, frozen, and canned fruits. Dried fruits, such as raisins, prunes, and cranberries. Vegetables All fresh vegetables. Vegetables that are frozen without sauce or added salt. Low-sodium or sodium-free canned vegetables. Grains Bread with less than 80 mg of sodium per slice. Whole-wheat pasta, quinoa, and brown rice. Oats and oatmeal. Barley. Millet. Grits and cream of wheat. Whole-grain and whole-wheat cold cereal. Meats and other protein foods Lean cuts of meat. Skinless chicken and turkey. Fish with high omega-3 fatty acids, such as salmon, sardines, and other cold-water fishes. Eggs. Dried beans, peas, and edamame. Unsalted nuts and nut  butters. Dairy Low-fat or nonfat (skim) milk and dried milk. Rice milk, soy milk, and almond milk. Low-fat or nonfat yogurt. Small amounts of reduced-sodium block cheese. Low-sodium cottage cheese. Fats and oils Olive, canola, soybean, flaxseed, or sunflower oil. Avocado. Sweets and desserts Apple sauce. Granola bars. Sugar-free pudding and gelatin. Frozen fruit bars. Seasoning and other foods Fresh and dried herbs. Lemon or lime juice. Vinegar. Low-sodium ketchup. Salt-free marinades, salad dressings, sauces, and seasonings. The items listed above may not be a complete list of foods and beverages you can eat. Contact a dietitian for more information. Foods to avoid The items listed may not be a complete list. Talk with your dietitian about what dietary choices are best for you. Fruits Fruits that are dried with sodium-containing preservatives. Vegetables Canned vegetables. Frozen vegetables with sauce or seasonings. Creamed vegetables. French fries. Onion rings. Pickled vegetables and sauerkraut. Grains Bread with more than 80 mg of sodium per slice. Hot or cold cereal with more than 140 mg sodium per serving. Salted pretzels and crackers. Pre-packaged breadcrumbs. Bagels, croissants, and biscuits. Meats and other protein foods Ribs and chicken wings. Bacon, ham, pepperoni, bologna, salami, and packaged luncheon meats. Hot dogs, bratwurst, and sausage. Canned meat. Smoked meat and fish. Salted nuts and seeds. Dairy Whole milk, half-and-half, and cream. Buttermilk. Processed cheese, cheese spreads, and cheese curds. Regular cottage cheese. Feta cheese. Shredded cheese. String cheese. Fats and oils Butter, lard, shortening, ghee, and bacon fat. Canned and packaged gravies. Seasoning and other foods Onion salt, garlic salt, table salt, and sea salt. Marinades. Regular salad dressings. Relishes, pickles, and olives. Meat flavorings and tenderizers, and bouillon cubes. Horseradish, ketchup, and  mustard. Worcestershire sauce. Teriyaki sauce, soy sauce (including reduced sodium). Hot sauce and Tabasco sauce. Steak sauce, fish sauce, oyster sauce, and cocktail sauce. Taco seasonings. Barbecue sauce. Tartar sauce. The items listed above may not be a complete list of foods and beverages you should avoid. Contact a dietitian for more information. Summary  A heart failure eating plan includes changes that limit your intake of sodium and unhealthy fat, and it may help you lose weight or maintain a healthy weight. Your health care provider may also recommend limiting how much fluid you drink.  Most people with heart failure should eat no more than 2,300 mg of salt (sodium) a day. The amount of sodium that is recommended for you may be lower, depending on your condition.  Contact your health care provider or dietitian before making any major changes to your diet. This information is not intended to replace advice given to you by your   health care provider. Make sure you discuss any questions you have with your health care provider. Document Revised: 10/18/2018 Document Reviewed: 01/06/2017 Elsevier Patient Education  [place].   Heart Failure Action Plan A heart failure action plan helps you understand what to do when you have symptoms of heart failure. Follow the plan that was created by you and your health care provider. Review your plan each time you visit your health care provider. Red zone These signs and symptoms mean you should get medical help right away:  You have trouble breathing when resting.  You have a dry cough that is getting worse.  You have swelling or pain in your legs or abdomen that is getting worse.  You suddenly gain more than 2-3 lb (0.9-1.4 kg) in a day, or more than 5 lb (2.3 kg) in one week. This amount may be more or less depending on your condition.  You have trouble staying awake or you feel confused.  You have chest pain.  You do not have an  appetite.  You pass out. If you experience any of these symptoms:  Call your local emergency services (911 in the [place] right away or seek help at the emergency department of the nearest hospital. Yellow zone These signs and symptoms mean your condition may be getting worse and you should make some changes:  You have trouble breathing when you are active or you need to sleep with extra pillows.  You have swelling in your legs or abdomen.  You gain 2-3 lb (0.9-1.4 kg) in one day, or 5 lb (2.3 kg) in one week. This amount may be more or less depending on your condition.  You get tired easily.  You have trouble sleeping.  You have a dry cough. If you experience any of these symptoms:  Contact your health care provider within the next day.  Your health care provider may adjust your medicines. Green zone These signs mean you are doing well and can continue what you are doing:  You do not have shortness of breath.  You have very little swelling or no new swelling.  Your weight is stable (no gain or loss).  You have a normal activity level.  You do not have chest pain or any other new symptoms. Follow these instructions at home:  Take over-the-counter and prescription medicines only as told by your health care provider.  Weigh yourself daily. Your target weight is __________ lb (__________ kg). ? Call your health care provider if you gain more than __________ lb (__________ kg) in a day, or more than __________ lb (__________ kg) in one week.  Eat a heart-healthy diet. Work with a diet and nutrition specialist (dietitian) to create an eating plan that is best for you.  Keep all follow-up visits as told by your health care provider. This is important. Where to find more information  American Heart Association: www.heart.org Summary  Follow the action plan that was created by you and your health care provider.  Get help right away if you have any symptoms in the Red  zone. This information is not intended to replace advice given to you by your health care provider. Make sure you discuss any questions you have with your health care provider. Document Revised: 08/04/2017 Document Reviewed: 10/01/2016 Elsevier Patient Education  2020 [employer].

## 2020-01-30 NOTE — Progress Notes (Signed)
Initial Nutrition Assessment  DOCUMENTATION CODES:   Not applicable  INTERVENTION:   - Ensure Enlive po BID, each supplement provides 350 kcal and 20 grams of protein  - Magic cup TID with meals, each supplement provides 290 kcal and 9 grams of protein  - Encourage adequate PO intake  NUTRITION DIAGNOSIS:   Increased nutrient needs related to chronic illness as evidenced by estimated needs.  GOAL:   Patient will meet greater than or equal to 90% of their needs  MONITOR:   PO intake, Supplement acceptance, Labs, I & O's, Skin, Weight trends  REASON FOR ASSESSMENT:   Malnutrition Screening Tool    ASSESSMENT:   79 year old male who presented on 5/19. PMH of CHF, CAD s/p stenting, remote history of VF arrest x 2, atrial fibrillation, CKD, severe LE PAD.   5/21 - s/p RHC, [facility]  Pt has decided not to proceed with surgery.  Spoke with pt at bedside. Pt reports appetite is "okay." Pt states that this is normal for him. Noted untouched meal tray at bedside. Pt reports that he is going home today which is why he hasn't eaten anything. No discharge orders noted.  Pt shares that yesterday he drank 3 Ensure supplements then vomited. Pt denies any nausea at this time. Pt states that he drinks oral nutrition supplements at home.  Pt denies any recent weight changes. Pt reports UBW as 160-170 lbs.  RD will order Ensure Enlive BID during admission.  Reviewed weight history in chart. Pt's weight has fluctuated between 69-92 kg over the last 7 months. Suspect weight fluctuations related to fluid shifts given CHF diagnosis. Pt with moderate pitting edema to BLE per RN edema assessment.  Meal Completion: 5-50%  Medications reviewed and include: Ensure Enlive daily, ferrous sulfate, protonix, spironolactone, IV abx  Labs reviewed: sodium 125, BUN 35, creatinine 2.43, hemoglobin 8.7  UOP: 725 ml x 24 hours  NUTRITION - FOCUSED PHYSICAL EXAM:  Pt refused RD physical exam.  Diet  Order:   Diet Order            Diet Heart Room service appropriate? Yes; Fluid consistency: Thin  Diet effective now              EDUCATION NEEDS:   No education needs have been identified at this time  Skin:  Skin Assessment: Skin Integrity Issues: Other: non-pressure wounds to bilateral feet  Last BM:  01/30/20 large type 3  Height:   Ht Readings from Last 1 Encounters:  01/29/20 6\' 3"  (1.905 m)    Weight:   Wt Readings from Last 1 Encounters:  01/30/20 73.8 kg    Ideal Body Weight:  89.1 kg  BMI:  Body mass index is 20.34 kg/m.  Estimated Nutritional Needs:   Kcal:  1900-2100  Protein:  95-110 grams  Fluid:  1.5 L/day    Gaynell Face, MS, RD, LDN Inpatient Clinical Dietitian Pager: [phone] Weekend/After Hours: [phone]

## 2020-01-30 NOTE — Progress Notes (Signed)
ANTICOAGULATION CONSULT NOTE  Pharmacy Consult for Heparin Indication: atrial fibrillation  Patient Measurements: Height: 6\' 3"  (190.5 cm) Weight: 73.8 kg (162 lb 11.2 oz) IBW/kg (Calculated) : 84.5 Heparin Dosing Weight: 73.5kg  Vital Signs: Temp: 98.2 F (36.8 C) (05/27 0338) Temp Source: Oral (05/27 0338) BP: 91/60 (05/27 0345) Pulse Rate: 82 (05/27 0345)  Labs: Recent Labs    01/28/20 0344 01/28/20 0344 01/28/20 2012 01/29/20 0417 01/30/20 0500  HGB 8.9*   < >  --  8.8* 8.7*  HCT 25.5*  --   --  24.9* 25.4*  PLT 221  --   --  243 225  HEPARINUNFRC 0.28*  --   --  0.41 0.29*  CREATININE 1.98*  --  1.98* 2.08*  --    < > = values in this interval not displayed.    Estimated Creatinine Clearance: 31.5 mL/min (A) (by C-G formula based on SCr of 2.08 mg/dL (H)).  Medical History: Past Medical History:  Diagnosis Date  . AAA (abdominal aortic aneurysm) ([place])    a. 08/2015 Abd U/S: 2.7 x 2.9 x 3.2 cm infrarenal AAA.  . Bradycardia    a. Requiring discontinuation of use of BB/CCB  . Cardiac arrest - ventricular fibrillation    a. 03/2012 in setting of hypokalemia (prolonged hosp with VDRF, tracheobronchitis, ARF, shock liver, PAF, AMS felt secondary to post-anoxic encephalopathy/shock).  . Carotid artery stenosis    a. 01/2011 - 40-59% bilateral stenosis;  b. 06/2015 Carotid U/S: 40-59% bilat ICA stenosis->f/u 6 mos.  . CHF (congestive heart failure) ([place])   . CKD (chronic kidney disease), stage III   . Coronary artery disease    a. s/p aborted ant STEMI tx with Cypher DES to LAD 10/04 (residual at cath: D1 50%, CFX 40% and multiple dist 70%, EF 55%);   b. myoview 3/10: Ef 47%, infero-apical isch, LOW RISK - med Tx recommended;  c. 12/2012 VF Arrest/Cath: LAD 40isr, 80 apical, LCX 100 (failed PTCA), RCA nondom.  . Diabetes mellitus   . Diverticulosis 2001  . Elevated LFTs    Shock liver 03/2012  . H/O: CVA (cardiovascular accident)   . Hematemesis    a. 12/2010 felt  2/2 Mallory Weiss tear - pt could not afford colonscopy/EGD at that time so Coumadin was deferred. Coumadin initiated 03/2012 without any evidence for bleeding.  [facility]an White History of Ischemic Cardiomyopathy    a. EF 50-55% by echo 03/09/12 (was 30% by echo 02/24/12); b. 03/2013 Echo: EF 55-60%, mild MR, sev dil LA, mod dil RA, PASP 36mmHg.  Logan White Hypertensive heart disease    a. echo 4/12: EF 50%, asymmetric septal hypertrophy, no SAM or LVOT gradient, LAE, PASP 35  . Inguinal hernia   . 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 Iron deficiency anemia   . Mitral regurgitation    a. Mild by echo 03/2012 and 03/2013.  . Mixed Hyperlipidemia   . Peripheral vascular disease ([place])    a. h/o LE angioplasty;  b. 08/2015 Duplex: >50% RCIA, 100% REIA, >50% LEIA, elev vel in SMA, patent IVC.  Logan White Persistent atrial fibrillation ([place])    a. Dx 12/2010-->coumadin (CHA2DS2VASc = 7).  . QT prolongation   . Tubular adenoma of colon 11/2019   Assessment: [name] starting on heparin for afib (CHADS2VASc = 6). PTA Apixaban on hold for planned vascular surgery 5/21. Last dose 5/19 AM.  Patient has been more anemic (Hgb < 11) since March 2021, after undergoing fem-fem bypass.  Pharmacy consulted to dose heparin while apixaban is held.   Heparin level is just below goal this morning at 0.29 on heparin drip 1200 units/hr. CBC stable. No active bleed issues reported.   Goal of Therapy:  Heparin level 0.3-0.7 units/ml  Monitor platelets by anticoagulation protocol: Yes   Plan:  Increase heparin infusion to 1300 units/hr Monitor daily heparin level and CBC, s/sx bleeding F/u plans to switch back to oral anticoagulation post surgery (if patient agreeable to surgery)  Erin Hearing PharmD., BCPS Clinical Pharmacist 01/30/2020 7:18 AM

## 2020-01-30 NOTE — Discharge Summary (Addendum)
Advanced Heart Failure Team  Discharge Summary   Patient ID: Logan White MRN: [id number], DOB/AGE: 77-May-1942 77 y.o. Admit date: 01/22/2020 D/C date:     01/30/2020   Primary Discharge Diagnoses:  1. Acute on chronic Biventricular CHF  -> cardiogenic shock 2 PAD w/ Claudication  3. Fever/productive cough, suspected COPD Flare  4. CKD IV:  - Creatinine baseline ~2.5 5 CAD 6.  Chronic AF  7. Hyponatremia 8. Hypokalemia 9. Anemia  10. Chronic Hypoxic Respiratory Failure   Oxygen dependent    Hospital Course:  79 y/o male with chronic combined systolic/ diastolic CHF/ biventricular failure 2/2 ischemic CM, CAD s/p prior PCI of the LAD and LCx, remote h/o VF arrest x 2 (first in 2013 in the setting of hypokalemia (K of 2.2) and again in 2014 in setting of MI), no ICD, h/o permanent atrial fibrillation on chronic a/c w/ apxiaban, CKD (basline SCr ~ 2.0-2.3) and severe LE PAD w/ known R external iliac artery occlusion with severe infrainguinal arterial occlusive disease and peroneal runoff only. L SFA occlusion with diffuse infrainguinal arterial occlusive disease, being followed by [facility] and awaiting surgical revascularization once medically cleared. Of note, he underwent LE arterial angiography on 3/16.   Admitted for heart failure optimization prior to scheduled vascular surgery, left  bypass graft femoral-femoral artery w/ left iliac stent w/ Dr. Trula Slade on 5/21. On admit he had a/c systolic heart failure and required IV lasix. He decompensated and developed cardiogenic shock with severely reduced mixed venous saturation and required inotropes.  He was gradually optimized for surgery .    Unfortunately due to a/c systolic heart failure  +cardiogenic shock he was  felt to be high risk for surgery. He was given the option to continue with surgery but elected to cancel surgery even if it meant he may need amputation down the road. Dr Radene Gunning met and discussed with him at length. Milrinone was  stopped after decision was made to cancel surgery.   Discharged to home and will resume home health with Logan White. He will continue to be followed closely in the HF clinic. See below for detailed problem list.   1. Acute on chronic Biventricular CHF  -> cardiogenic shock - known ICM dating back to 2013.  - Echo 1/21 showed LVEF 25-30%, global hypokinesis, mild LVH, RV mildly enlarged w/ severely reduced systolic function. Moderate MR. Mild TR. Severe bi atrial enlargement. - RHC on 3/25 showed elevated biventricular filling pressures and with equalization of R & L sided diastolic pressures and preserved CO consistent with restrictive physiology. -  Developed cardiogenic shock on 5/20 with initial co-ox 36%. Hemodynamics markedly  improved on milrinone 0.25.  - He has refused surgery for PAD due to significant risk.   - Milrinone weaned off and he was switched back to po torsemide.  - No ARB/ARNI with CKD IV - Placed on low-dose b-blocker for AF rate control    2 PAD w/ Claudication  - known R external iliac artery occlusion with severe infrainguinal arterial occlusive disease and peroneal runoff only. L SFA occlusion with diffuse infrainguinal arterial occlusive disease - Dr. Trula Slade planned for  left iliac stent +/- bilateral femoral endarterectomies, and a left to right femorofemoral bypass - He was optimized for surgery but after a detailed discussion of the risks he refused.    3. Fever/productive cough, Suspected COPD Exacerbation - ? COPD flare -- Sputum GS with MOD GNR - on vanc/cefipime - continue nebs   4. CKD IV:  -  Creatinine baseline ~2.5 - Creatinine stable today at 2.1 - Renal function was followed closely.    5 CAD:  - prior LAD and LCx stenting - no s/s angina - no ASA due to apixaban - continue high dose statin    6.  Chronic AF - Rate up with addition of milrinone.  Placed on amiodarone for rate control.  - Off b-blocker due to shock.  - Once milrinone was  stopped amiodarone was also stopped.  - Placed back on  low-dose b-blocker for rate control - Switch heparin to Eliquis.    7. Hyponatremia - Na 124 today. Likely due to HF.    8. Hypokalemia - K 3.7  supp   9. Anemia , chronic disease Hgb 8.7. No obvious source.  Iron sats low. Received feraheme.     Discharge Vitals: Blood pressure 95/64, pulse 92, temperature 98.6 F (37 C), temperature source Oral, resp. rate 19, height '6\' 3"'  (1.905 m), weight 73.8 kg, SpO2 99 %.  Labs: Lab Results  Component Value Date   WBC 10.1 01/30/2020   HGB 8.7 (L) 01/30/2020   HCT 25.4 (L) 01/30/2020   MCV 72.0 (L) 01/30/2020   PLT 225 01/30/2020    Recent Labs  Lab 01/23/20 2359 01/24/20 0340 01/30/20 0829  NA 123*   < > 125*  K 5.1   < > 3.7  CL 89*   < > 91*  CO2 24   < > 22  BUN 44*   < > 35*  CREATININE 2.71*   < > 2.43*  CALCIUM 8.2*   < > 8.8*  PROT 7.0  --   --   BILITOT 1.8*  --   --   ALKPHOS 188*  --   --   ALT 16  --   --   AST 25  --   --   GLUCOSE 117*   < > 121*   < > = values in this interval not displayed.   Lab Results  Component Value Date   CHOL 66 11/17/2019   HDL 27 (L) 11/17/2019   LDLCALC 34 11/17/2019   TRIG 27 11/17/2019   BNP (last 3 results) Recent Labs    11/25/19 1004 12/26/19 1515 01/22/20 1850  BNP 2,757.9* 1,843.7* 2,557.6*    ProBNP (last 3 results) Recent Labs    08/15/19 0949  PROBNP 16,511*     Diagnostic Studies/Procedures   RHC 01/24/2020  On milrinone 0.25  RA = 16 RV = 59/17 PA = 58/30 (43) PCW = 28 Fick cardiac output/index = 4.9/2.5 PVR = 3.0 WU FA sat = 96% PA sat = 57%, 58% PaPI = 2.6   Assessment: 1. Marked volume overload with improved cardiac output on milrinone  Discharge Medications   Allergies as of 01/30/2020   No Known Allergies      Medication List     TAKE these medications    acetaminophen 325 MG tablet Commonly known as: TYLENOL Take 650 mg by mouth every 6 (six) hours as needed for  mild pain.   albuterol 108 (90 Base) MCG/ACT inhaler Commonly known as: VENTOLIN HFA Inhale 2 puffs into the lungs every 6 (six) hours as needed for wheezing or shortness of breath.   atorvastatin 80 MG tablet Commonly known as: LIPITOR TAKE 1 TABLET BY MOUTH EVERY DAY   Eliquis 5 MG Tabs tablet Generic drug: apixaban TAKE 1 TABLET BY MOUTH TWICE A DAY What changed: how much to take   Ensure Take  237 mLs by mouth daily.   ferrous sulfate 325 (65 FE) MG tablet Take 325 mg by mouth 2 (two) times daily.   Fluticasone-Salmeterol 250-50 MCG/DOSE Aepb Commonly known as: Advair Diskus Inhale 1 puff into the lungs 2 (two) times daily for 14 days. What changed:  when to take this reasons to take this   gabapentin 300 MG capsule Commonly known as: NEURONTIN Take 300 mg by mouth at bedtime.   hydrALAZINE 10 MG tablet Commonly known as: APRESOLINE TAKE 1 TABLET BY MOUTH TWICE A DAY   hydrOXYzine 25 MG capsule Commonly known as: VISTARIL Take 25 mg by mouth every 8 (eight) hours as needed for itching.   isosorbide mononitrate 30 MG 24 hr tablet Commonly known as: IMDUR Take 1 tablet (30 mg total) by mouth daily.   Klor-Con M20 20 MEQ tablet Generic drug: potassium chloride SA TAKE 1 TABLET BY MOUTH TWICE A DAY What changed: how much to take   metoprolol succinate 25 MG 24 hr tablet Commonly known as: TOPROL-XL Take 0.5 tablets (12.5 mg total) by mouth at bedtime. Take with or immediately following a meal.   nitroGLYCERIN 0.4 MG SL tablet Commonly known as: Nitrostat Place 1 tablet (0.4 mg total) under the tongue every 5 (five) minutes as needed for chest pain (Up to 3 doses).   oxymetazoline 0.05 % nasal spray Commonly known as: AFRIN Place 1 spray into both nostrils 2 (two) times daily as needed for congestion.   pantoprazole 40 MG tablet Commonly known as: PROTONIX Take 1 tablet (40 mg total) by mouth 2 (two) times daily.   SARNA EX Apply 1 application topically  daily as needed (itching).   spironolactone 25 MG tablet Commonly known as: ALDACTONE Take 1 tablet (25 mg total) by mouth daily.   torsemide 100 MG tablet Commonly known as: DEMADEX Take 1 tablet (100 mg total) by mouth 2 (two) times daily.        Disposition   The patient will be discharged in stable condition to home. Discharge Instructions     (HEART FAILURE PATIENTS) Call MD:  Anytime you have any of the following symptoms: 1) 3 pound weight gain in 24 hours or 5 pounds in 1 week 2) shortness of breath, with or without a dry hacking cough 3) swelling in the hands, feet or stomach 4) if you have to sleep on extra pillows at night in order to breathe.   Complete by: As directed    Diet - low sodium heart healthy   Complete by: As directed    Heart Failure patients record your daily weight using the same scale at the same time of day   Complete by: As directed    Increase activity slowly   Complete by: As directed       Follow-up Information     [facility] Follow up on 02/24/2020.   Specialty: Cardiology Why: at 1:40 Garage Code [id number] Contact information: [address] [id number] [place] [phone]             Duration of Discharge Encounter: Greater than 35 minutes   Signed, Darrick Grinder NP-C  01/30/2020, 3:53 PM  Patient seen and examined with the above-signed Advanced Practice Provider and/or Housestaff. I personally reviewed laboratory data, imaging studies and relevant notes. I independently examined the patient and formulated the important aspects of the plan. I have edited the note to reflect any of my changes or salient  points. I have personally discussed the plan with the patient and/or family.  79 y/o with advanced systolic HF, CKD IV admitted for medical optimization prior to LE revascularization surgery. Required inotropic support. On day of surgery patient decided that he  did not want to proceed due to operative risks. Long discussion about alternative of possible B BKA. He remained steadfast in his decision. Inotropes weaned and he was discharged home.   Glori Bickers, MD  10:19 AM

## 2020-01-30 NOTE — Progress Notes (Signed)
Advanced Heart Failure Rounding Note  PCP-Cardiologist: Kirk Ruths, MD   Subjective:    Surgery cancelled on 5/20 due to shock. Initial co-ox 36%.   He was optimized for surgery yesterday but after a detailed discussion of the risks he refused.   Remains on milrinone, IV amio and heparin.  Breathing ok. No CP. Wants to go home. Co-ox 54%     Objective:   Weight Range: 73.8 kg Body mass index is 20.34 kg/m.   Vital Signs:   Temp:  [98 F (36.7 C)-99.5 F (37.5 C)] 98.6 F (37 C) (05/27 1059) Pulse Rate:  [65-107] 92 (05/27 1059) Resp:  [18-33] 19 (05/27 1059) BP: (87-125)/(52-96) 95/64 (05/27 1059) SpO2:  [92 %-100 %] 99 % (05/27 1059) Weight:  [73.8 kg] 73.8 kg (05/27 0456) Last BM Date: 01/30/20  Weight change: Filed Weights   01/28/20 0500 01/29/20 0800 01/30/20 0456  Weight: 72.2 kg 74.1 kg 73.8 kg    Intake/Output:   Intake/Output Summary (Last 24 hours) at 01/30/2020 1358 Last data filed at 01/30/2020 0733 Gross per 24 hour  Intake 1078.13 ml  Output 1025 ml  Net 53.13 ml      Physical Exam   General:  Weak appearing. No resp difficulty HEENT: normal Neck: supple.JVP to jaw Carotids 2+ bilat; no bruits. No lymphadenopathy or thryomegaly appreciated. Cor: PMI nondisplaced. Irregular rate & rhythm. No rubs, gallops or murmurs. Lungs: clear Abdomen: soft, nontender, nondistended. No hepatosplenomegaly. No bruits or masses. Good bowel sounds. Extremities: no cyanosis, clubbing, rash, edema  + wounds Neuro: alert & orientedx3, cranial nerves grossly intact. moves all 4 extremities w/o difficulty. Affect pleasant    Telemetry    AF 80-90s Personally reviewed   Labs    CBC Recent Labs    01/29/20 0417 01/30/20 0500  WBC 9.9 10.1  HGB 8.8* 8.7*  HCT 24.9* 25.4*  MCV 71.8* 72.0*  PLT 243 790   Basic Metabolic Panel Recent Labs    01/29/20 0417 01/30/20 0829  NA 124* 125*  K 3.7 3.7  CL 90* 91*  CO2 22 22  GLUCOSE 122* 121*    BUN 29* 35*  CREATININE 2.08* 2.43*  CALCIUM 8.0* 8.8*   Liver Function Tests No results for input(s): AST, ALT, ALKPHOS, BILITOT, PROT, ALBUMIN in the last 72 hours. No results for input(s): LIPASE, AMYLASE in the last 72 hours. Cardiac Enzymes No results for input(s): CKTOTAL, CKMB, CKMBINDEX, TROPONINI in the last 72 hours.  BNP: BNP (last 3 results) Recent Labs    11/25/19 1004 12/26/19 1515 01/22/20 1850  BNP 2,757.9* 1,843.7* 2,557.6*    ProBNP (last 3 results) Recent Labs    08/15/19 0949  PROBNP 16,511*     D-Dimer No results for input(s): DDIMER in the last 72 hours. Hemoglobin A1C No results for input(s): HGBA1C in the last 72 hours. Fasting Lipid Panel No results for input(s): CHOL, HDL, LDLCALC, TRIG, CHOLHDL, LDLDIRECT in the last 72 hours. Thyroid Function Tests No results for input(s): TSH, T4TOTAL, T3FREE, THYROIDAB in the last 72 hours.  Invalid input(s): FREET3  Other results:   Imaging    No results found.   Medications:     Scheduled Medications: . apixaban  5 mg Oral BID  . atorvastatin  80 mg Oral Daily  . Chlorhexidine Gluconate Cloth  6 each Topical Daily  . feeding supplement (ENSURE ENLIVE)  237 mL Oral Daily  . ferrous sulfate  325 mg Oral BID WC  . gabapentin  300 mg Oral QHS  . hydrALAZINE  10 mg Oral BID  . isosorbide mononitrate  30 mg Oral Daily  . mouth rinse  15 mL Mouth Rinse BID  . metoprolol succinate  12.5 mg Oral Daily  . pantoprazole  40 mg Oral BID  . sodium chloride flush  10-40 mL Intracatheter Q12H  . spironolactone  25 mg Oral Daily    Infusions: . sodium chloride Stopped (01/29/20 1749)  . ceFEPime (MAXIPIME) IV 2 g (01/30/20 1540)  . ferumoxytol 468 mL/hr at 01/29/20 1800  . vancomycin 1,000 mg (01/30/20 1146)    PRN Medications: sodium chloride, acetaminophen, albuterol, ondansetron (ZOFRAN) IV, promethazine, sodium chloride, sodium chloride flush, zolpidem    Assessment/Plan    1.  Acute on chronic Biventricular CHF  -> cardiogenic shock - known ICM dating back to 2013.  - Echo 1/21showed LVEF 25-30%, global hypokinesis, mild LVH, RV mildly enlarged w/ severely reduced systolic function. Moderate MR. Mild TR. Severe bi atrial enlargement. - RHC on 3/25 showed elevated biventricular filling pressures and with equalization of R &L sided diastolic pressuresand preserved CO consistent withrestrictive physiology. - Developed cardiogenic shock on 5/20 with initial co-ox 36%.  - Hemodynamics markedly  improved on milrinone 0.25.  - CO-OX 54%.  - He has refused surgery for PAD.  - Will stop milrinone. Watch closely for recurrent low output HF. - Volume status ok. Switch back to home po diuretic regimen - No ARB/ARNI with CKD IV - Restart low-dose b-blocker for AF rate control   2 PAD w/ Claudication  - knownR external iliac artery occlusion with severe infrainguinal arterial occlusive disease and peroneal runoff only. L SFA occlusion with diffuse infrainguinal arterial occlusive disease - Dr. Trula Slade plans on a left iliac stent +/- bilateral femoral endarterectomies, and a left to right femorofemoral bypass - He was optimized for surgery yesterday but after a detailed discussion of the risks he refused.   3. Fever/productive cough - ? COPD flare -- Sputum GS with MOD GNR - on vanc/cefipime - continue nebs  4. CKD IV:  - Creatinine baseline ~2.5 - Creatinine stable today at 2.1 - Stopping milrinone. Watch for worsening  5 CAD:  - prior LAD and LCx stenting - no s/s angina - no ASA due to apixaban - continue high dose statin   6.Chronic AF - Rate up with addition of milrinone - Off b-blocker due to shock.  - Stopping milrinone. Will stop IV amio as well - Add back low-dose b-blocker for rate control - Switch heparin to Eliquis. Discussed dosing with PharmD personally.  7. Hyponatremia - Na 124 today. Likely due to HF. Can give dose of tolvaptan if not  improving - Restrict free water.   8. Hypokalemia - K 3.7  supp  9. Anemia  Hgb 8.7. No obvious source.  Iron sats low. Received feraheme.    Length of Stay: [place], MD  01/30/2020, 1:58 PM  Advanced Heart Failure Team Pager [phone] (M-F; 7a - 4p)  Please contact [facility] for night-coverage after hours (4p -7a ) and weekends on [email or web address]

## 2020-01-30 NOTE — Plan of Care (Signed)
  Problem: Education: Goal: Ability to demonstrate management of disease process will improve 01/30/2020 1549 by Don Perking, RN Outcome: Completed/Met 01/30/2020 0751 by Don Perking, RN Outcome: Progressing Goal: Ability to verbalize understanding of medication therapies will improve 01/30/2020 1549 by Don Perking, RN Outcome: Completed/Met 01/30/2020 0751 by Don Perking, RN Outcome: Progressing Goal: Individualized Educational Video(s) 01/30/2020 1549 by Don Perking, RN Outcome: Completed/Met 01/30/2020 0751 by Don Perking, RN Outcome: Progressing   Problem: Activity: Goal: Capacity to carry out activities will improve 01/30/2020 1549 by Don Perking, RN Outcome: Completed/Met 01/30/2020 0751 by Don Perking, RN Outcome: Progressing   Problem: Cardiac: Goal: Ability to achieve and maintain adequate cardiopulmonary perfusion will improve 01/30/2020 1549 by Don Perking, RN Outcome: Completed/Met 01/30/2020 0751 by Don Perking, RN Outcome: Progressing   Problem: Education: Goal: Knowledge of General Education information will improve Description: Including pain rating scale, medication(s)/side effects and non-pharmacologic comfort measures 01/30/2020 1549 by Don Perking, RN Outcome: Completed/Met 01/30/2020 0751 by Don Perking, RN Outcome: Progressing   Problem: Health Behavior/Discharge Planning: Goal: Ability to manage health-related needs will improve 01/30/2020 1549 by Don Perking, RN Outcome: Completed/Met 01/30/2020 0751 by Don Perking, RN Outcome: Progressing   Problem: Clinical Measurements: Goal: Ability to maintain clinical measurements within normal limits will improve 01/30/2020 1549 by Don Perking, RN Outcome: Completed/Met 01/30/2020 0751 by Don Perking, RN Outcome: Progressing Goal: Will remain free from infection 01/30/2020 1549 by Don Perking,  RN Outcome: Completed/Met 01/30/2020 0751 by Don Perking, RN Outcome: Progressing Goal: Diagnostic test results will improve 01/30/2020 1549 by Don Perking, RN Outcome: Completed/Met 01/30/2020 0751 by Don Perking, RN Outcome: Progressing Goal: Respiratory complications will improve 01/30/2020 1549 by Don Perking, RN Outcome: Completed/Met 01/30/2020 0751 by Don Perking, RN Outcome: Progressing Goal: Cardiovascular complication will be avoided 01/30/2020 1549 by Don Perking, RN Outcome: Completed/Met 01/30/2020 0751 by Don Perking, RN Outcome: Progressing   Problem: Activity: Goal: Risk for activity intolerance will decrease 01/30/2020 1549 by Don Perking, RN Outcome: Completed/Met 01/30/2020 0751 by Don Perking, RN Outcome: Progressing   Problem: Nutrition: Goal: Adequate nutrition will be maintained 01/30/2020 1549 by Don Perking, RN Outcome: Completed/Met 01/30/2020 0751 by Don Perking, RN Outcome: Progressing   Problem: Coping: Goal: Level of anxiety will decrease 01/30/2020 1549 by Don Perking, RN Outcome: Completed/Met 01/30/2020 0751 by Don Perking, RN Outcome: Progressing   Problem: Elimination: Goal: Will not experience complications related to bowel motility 01/30/2020 1549 by Don Perking, RN Outcome: Completed/Met 01/30/2020 0751 by Don Perking, RN Outcome: Progressing Goal: Will not experience complications related to urinary retention 01/30/2020 1549 by Don Perking, RN Outcome: Completed/Met 01/30/2020 0751 by Don Perking, RN Outcome: Progressing   Problem: Pain Managment: Goal: General experience of comfort will improve 01/30/2020 1549 by Don Perking, RN Outcome: Completed/Met 01/30/2020 0751 by Don Perking, RN Outcome: Progressing   Problem: Safety: Goal: Ability to remain free from injury will improve 01/30/2020 1549 by  Don Perking, RN Outcome: Completed/Met 01/30/2020 0751 by Don Perking, RN Outcome: Progressing   Problem: Skin Integrity: Goal: Risk for impaired skin integrity will decrease 01/30/2020 1549 by Don Perking, RN Outcome: Completed/Met 01/30/2020 0751 by Don Perking, RN Outcome: Progressing

## 2020-01-30 NOTE — Plan of Care (Signed)

## 2020-01-31 ENCOUNTER — Other Ambulatory Visit: Payer: Self-pay | Admitting: *Deleted

## 2020-01-31 LAB — CULTURE, BLOOD (ROUTINE X 2)
Culture: NO GROWTH
Culture: NO GROWTH
Special Requests: ADEQUATE

## 2020-01-31 NOTE — Patient Outreach (Signed)
[facility]) Care Management  [place]  01/31/2020   Logan White 1941-01-01 [id number]   Transition of care referral   Referral received : 01/31/20 Referral source: [facility] discharge notification  Date of Admission :01/22/20 Diagnosis: Heart Failure  Date of Discharge: 01/30/20 Facility : [place]: [facility]   Patient active with [facility] complex  care management prior to Admission .   Subjective:  Unsuccessful outreach call to patient wife contact number, no answer able to leave a HIPAA compliant message for return call. [phone] Incoming return call from patient wife,Patricia, HIPAA confirmed x 2 identifiers. Patient familiar with RN from prior to admission outreaches.  She discussed patient discharge home on yesterday, she states that he is tired and resting at this time. She states patient changed his mind on having the surgery after hearing about the risk. She states that he has been up, this morning but back resting in bed.. She has not observed wound sites to right and left toe areas noted.  Wife states patient has not complained on leg discomfort since being home, he denies shortness of breath he continues to wear his oxygen at 2 liters.     Encounter Medications:  Outpatient Encounter Medications as of 01/31/2020  Medication Sig  . acetaminophen (TYLENOL) 325 MG tablet Take 650 mg by mouth every 6 (six) hours as needed for mild pain.   Marland Kitchen albuterol (VENTOLIN HFA) 108 (90 Base) MCG/ACT inhaler Inhale 2 puffs into the lungs every 6 (six) hours as needed for wheezing or shortness of breath.  Marland Kitchen atorvastatin (LIPITOR) 80 MG tablet TAKE 1 TABLET BY MOUTH EVERY DAY (Patient taking differently: Take 80 mg by mouth daily. )  . Camphor-Menthol (SARNA EX) Apply 1 application topically daily as needed (itching).  Marland Kitchen ELIQUIS 5 MG TABS tablet TAKE 1 TABLET BY MOUTH TWICE A DAY (Patient taking differently: Take 5 mg by mouth 2 (two) times daily. )  . Ensure  (ENSURE) Take 237 mLs by mouth daily.   . ferrous sulfate 325 (65 FE) MG tablet Take 325 mg by mouth 2 (two) times daily.  . Fluticasone-Salmeterol (ADVAIR DISKUS) 250-50 MCG/DOSE AEPB Inhale 1 puff into the lungs 2 (two) times daily for 14 days. (Patient taking differently: Inhale 1 puff into the lungs 2 (two) times daily as needed (shortness of breath). )  . gabapentin (NEURONTIN) 300 MG capsule Take 300 mg by mouth at bedtime.   . hydrALAZINE (APRESOLINE) 10 MG tablet TAKE 1 TABLET BY MOUTH TWICE A DAY (Patient taking differently: Take 10 mg by mouth 2 (two) times daily. )  . hydrOXYzine (VISTARIL) 25 MG capsule Take 25 mg by mouth every 8 (eight) hours as needed for itching.   . isosorbide mononitrate (IMDUR) 30 MG 24 hr tablet Take 1 tablet (30 mg total) by mouth daily.  Marland Kitchen KLOR-CON M20 20 MEQ tablet TAKE 1 TABLET BY MOUTH TWICE A DAY (Patient taking differently: Take 20 mEq by mouth 2 (two) times daily. )  . metoprolol succinate (TOPROL-XL) 25 MG 24 hr tablet Take 0.5 tablets (12.5 mg total) by mouth at bedtime. Take with or immediately following a meal.  . nitroGLYCERIN (NITROSTAT) 0.4 MG SL tablet Place 1 tablet (0.4 mg total) under the tongue every 5 (five) minutes as needed for chest pain (Up to 3 doses).  Marland Kitchen oxymetazoline (AFRIN) 0.05 % nasal spray Place 1 spray into both nostrils 2 (two) times daily as needed for congestion.  . pantoprazole (PROTONIX) 40 MG tablet  Take 1 tablet (40 mg total) by mouth 2 (two) times daily.  Marland Kitchen spironolactone (ALDACTONE) 25 MG tablet Take 1 tablet (25 mg total) by mouth daily.  Marland Kitchen torsemide (DEMADEX) 100 MG tablet Take 1 tablet (100 mg total) by mouth 2 (two) times daily.   No facility-administered encounter medications on file as of 01/31/2020.    Functional Status:  In your present state of health, do you have any difficulty performing the following activities: 01/22/2020 12/10/2019  Hearing? N N  Vision? N N  Difficulty concentrating or making decisions? N  N  Walking or climbing stairs? Y Y  Comment - using walker  Dressing or bathing? N Y  Comment - wife helps  Doing errands, shopping? Y Y  Comment - wife provides Copywriter, advertising and eating ? - N  Comment - wife prepares meals  Using the Toilet? - N  In the past six months, have you accidently leaked urine? - N  Do you have problems with loss of bowel control? - N  Managing your Medications? - Y  Comment - wife assist  Managing your Finances? - N  Housekeeping or managing your Housekeeping? - N  Comment - wife manages  Some recent data might be hidden    Fall/Depression Screening: Fall Risk  01/01/2020 12/17/2019  Falls in the past year? 0 0  Number falls in past yr: 0 -  Risk for fall due to : Impaired balance/gait Impaired balance/gait  Follow up Falls prevention discussed Falls prevention discussed   PHQ 2/9 Scores 12/17/2019  PHQ - 2 Score 0    Assessment:   Heart failure: No complaints of worsening symptoms , Discharge weight 78.8 kg ( 162.3 lbs). Patient/wife will benefit from ongoing reinforcement of heart failure education and support.   Atherosclerotic lower extremity disease: Patient declined surgery, not complaining of pain per wife. Will need continued home health/Remote health follow up wound care at right and left toe  areas.    Appointments  Dr. Sung Amabile 02/24/20 Dr.Stark , 02/26/20 [place] and wellness to establish for Primary Care to be rescheduled.   Plan:  Will plan return call for follow up  in the next 3 business days.  Will collaborate with Remote health as needed.  Verified patient wife has contact number for [facility] health and explained services to resume in 24 to 48 hours after discharge.     Joylene Draft, RN, BSN  [place] Management Coordinator  [phone]- Mobile [phone]- Toll Free Main Office

## 2020-02-02 DIAGNOSIS — N184 Chronic kidney disease, stage 4 (severe): Secondary | ICD-10-CM | POA: Diagnosis not present

## 2020-02-02 DIAGNOSIS — E11621 Type 2 diabetes mellitus with foot ulcer: Secondary | ICD-10-CM | POA: Diagnosis not present

## 2020-02-02 DIAGNOSIS — L97521 Non-pressure chronic ulcer of other part of left foot limited to breakdown of skin: Secondary | ICD-10-CM | POA: Diagnosis not present

## 2020-02-02 DIAGNOSIS — I5042 Chronic combined systolic (congestive) and diastolic (congestive) heart failure: Secondary | ICD-10-CM | POA: Diagnosis not present

## 2020-02-02 DIAGNOSIS — I130 Hypertensive heart and chronic kidney disease with heart failure and stage 1 through stage 4 chronic kidney disease, or unspecified chronic kidney disease: Secondary | ICD-10-CM | POA: Diagnosis not present

## 2020-02-02 DIAGNOSIS — I5082 Biventricular heart failure: Secondary | ICD-10-CM | POA: Diagnosis not present

## 2020-02-02 DIAGNOSIS — E1122 Type 2 diabetes mellitus with diabetic chronic kidney disease: Secondary | ICD-10-CM | POA: Diagnosis not present

## 2020-02-02 DIAGNOSIS — S91105D Unspecified open wound of left lesser toe(s) without damage to nail, subsequent encounter: Secondary | ICD-10-CM | POA: Diagnosis not present

## 2020-02-02 DIAGNOSIS — M5136 Other intervertebral disc degeneration, lumbar region: Secondary | ICD-10-CM | POA: Diagnosis not present

## 2020-02-03 DIAGNOSIS — I5023 Acute on chronic systolic (congestive) heart failure: Secondary | ICD-10-CM | POA: Diagnosis not present

## 2020-02-04 ENCOUNTER — Emergency Department (HOSPITAL_COMMUNITY): Payer: Medicare HMO

## 2020-02-04 ENCOUNTER — Other Ambulatory Visit: Payer: Self-pay | Admitting: *Deleted

## 2020-02-04 ENCOUNTER — Encounter (HOSPITAL_COMMUNITY): Payer: Self-pay | Admitting: Emergency Medicine

## 2020-02-04 ENCOUNTER — Other Ambulatory Visit: Payer: Self-pay

## 2020-02-04 ENCOUNTER — Inpatient Hospital Stay (HOSPITAL_COMMUNITY)
Admission: EM | Admit: 2020-02-04 | Discharge: 2020-02-10 | DRG: 871 | Disposition: A | Discharge status: Hospice | Payer: Medicare HMO | Attending: Internal Medicine | Admitting: Internal Medicine

## 2020-02-04 DIAGNOSIS — I5082 Biventricular heart failure: Secondary | ICD-10-CM | POA: Diagnosis present

## 2020-02-04 DIAGNOSIS — R6520 Severe sepsis without septic shock: Secondary | ICD-10-CM | POA: Diagnosis present

## 2020-02-04 DIAGNOSIS — E860 Dehydration: Secondary | ICD-10-CM | POA: Diagnosis not present

## 2020-02-04 DIAGNOSIS — E1122 Type 2 diabetes mellitus with diabetic chronic kidney disease: Secondary | ICD-10-CM | POA: Diagnosis present

## 2020-02-04 DIAGNOSIS — I130 Hypertensive heart and chronic kidney disease with heart failure and stage 1 through stage 4 chronic kidney disease, or unspecified chronic kidney disease: Secondary | ICD-10-CM | POA: Diagnosis not present

## 2020-02-04 DIAGNOSIS — I714 Abdominal aortic aneurysm, without rupture: Secondary | ICD-10-CM | POA: Diagnosis present

## 2020-02-04 DIAGNOSIS — Z515 Encounter for palliative care: Secondary | ICD-10-CM

## 2020-02-04 DIAGNOSIS — A419 Sepsis, unspecified organism: Principal | ICD-10-CM | POA: Diagnosis present

## 2020-02-04 DIAGNOSIS — E1152 Type 2 diabetes mellitus with diabetic peripheral angiopathy with gangrene: Secondary | ICD-10-CM | POA: Diagnosis not present

## 2020-02-04 DIAGNOSIS — Z8674 Personal history of sudden cardiac arrest: Secondary | ICD-10-CM

## 2020-02-04 DIAGNOSIS — I5043 Acute on chronic combined systolic (congestive) and diastolic (congestive) heart failure: Secondary | ICD-10-CM | POA: Diagnosis not present

## 2020-02-04 DIAGNOSIS — I70262 Atherosclerosis of native arteries of extremities with gangrene, left leg: Secondary | ICD-10-CM | POA: Diagnosis present

## 2020-02-04 DIAGNOSIS — N183 Chronic kidney disease, stage 3 unspecified: Secondary | ICD-10-CM | POA: Diagnosis present

## 2020-02-04 DIAGNOSIS — Z9861 Coronary angioplasty status: Secondary | ICD-10-CM

## 2020-02-04 DIAGNOSIS — I959 Hypotension, unspecified: Secondary | ICD-10-CM | POA: Diagnosis not present

## 2020-02-04 DIAGNOSIS — D631 Anemia in chronic kidney disease: Secondary | ICD-10-CM | POA: Diagnosis present

## 2020-02-04 DIAGNOSIS — N179 Acute kidney failure, unspecified: Secondary | ICD-10-CM | POA: Diagnosis present

## 2020-02-04 DIAGNOSIS — M2550 Pain in unspecified joint: Secondary | ICD-10-CM | POA: Diagnosis not present

## 2020-02-04 DIAGNOSIS — I10 Essential (primary) hypertension: Secondary | ICD-10-CM | POA: Diagnosis not present

## 2020-02-04 DIAGNOSIS — E782 Mixed hyperlipidemia: Secondary | ICD-10-CM | POA: Diagnosis present

## 2020-02-04 DIAGNOSIS — I255 Ischemic cardiomyopathy: Secondary | ICD-10-CM | POA: Diagnosis present

## 2020-02-04 DIAGNOSIS — E11621 Type 2 diabetes mellitus with foot ulcer: Secondary | ICD-10-CM | POA: Diagnosis present

## 2020-02-04 DIAGNOSIS — Z7189 Other specified counseling: Secondary | ICD-10-CM | POA: Diagnosis not present

## 2020-02-04 DIAGNOSIS — Z20822 Contact with and (suspected) exposure to covid-19: Secondary | ICD-10-CM | POA: Diagnosis present

## 2020-02-04 DIAGNOSIS — I5021 Acute systolic (congestive) heart failure: Secondary | ICD-10-CM | POA: Diagnosis not present

## 2020-02-04 DIAGNOSIS — E875 Hyperkalemia: Secondary | ICD-10-CM | POA: Diagnosis present

## 2020-02-04 DIAGNOSIS — I96 Gangrene, not elsewhere classified: Secondary | ICD-10-CM

## 2020-02-04 DIAGNOSIS — L0390 Cellulitis, unspecified: Secondary | ICD-10-CM

## 2020-02-04 DIAGNOSIS — I4821 Permanent atrial fibrillation: Secondary | ICD-10-CM | POA: Diagnosis not present

## 2020-02-04 DIAGNOSIS — L97529 Non-pressure chronic ulcer of other part of left foot with unspecified severity: Secondary | ICD-10-CM | POA: Diagnosis present

## 2020-02-04 DIAGNOSIS — E871 Hypo-osmolality and hyponatremia: Secondary | ICD-10-CM | POA: Diagnosis present

## 2020-02-04 DIAGNOSIS — L03032 Cellulitis of left toe: Secondary | ICD-10-CM | POA: Diagnosis not present

## 2020-02-04 DIAGNOSIS — I5084 End stage heart failure: Secondary | ICD-10-CM | POA: Diagnosis present

## 2020-02-04 DIAGNOSIS — I70235 Atherosclerosis of native arteries of right leg with ulceration of other part of foot: Secondary | ICD-10-CM | POA: Diagnosis not present

## 2020-02-04 DIAGNOSIS — Z8673 Personal history of transient ischemic attack (TIA), and cerebral infarction without residual deficits: Secondary | ICD-10-CM

## 2020-02-04 DIAGNOSIS — R58 Hemorrhage, not elsewhere classified: Secondary | ICD-10-CM | POA: Diagnosis not present

## 2020-02-04 DIAGNOSIS — Z66 Do not resuscitate: Secondary | ICD-10-CM | POA: Diagnosis not present

## 2020-02-04 DIAGNOSIS — I2510 Atherosclerotic heart disease of native coronary artery without angina pectoris: Secondary | ICD-10-CM | POA: Diagnosis present

## 2020-02-04 DIAGNOSIS — Z87891 Personal history of nicotine dependence: Secondary | ICD-10-CM

## 2020-02-04 DIAGNOSIS — R0602 Shortness of breath: Secondary | ICD-10-CM | POA: Diagnosis not present

## 2020-02-04 DIAGNOSIS — M19072 Primary osteoarthritis, left ankle and foot: Secondary | ICD-10-CM | POA: Diagnosis not present

## 2020-02-04 DIAGNOSIS — R062 Wheezing: Secondary | ICD-10-CM | POA: Diagnosis not present

## 2020-02-04 DIAGNOSIS — I998 Other disorder of circulatory system: Secondary | ICD-10-CM | POA: Diagnosis not present

## 2020-02-04 DIAGNOSIS — Z7901 Long term (current) use of anticoagulants: Secondary | ICD-10-CM

## 2020-02-04 DIAGNOSIS — Z79899 Other long term (current) drug therapy: Secondary | ICD-10-CM

## 2020-02-04 DIAGNOSIS — Z8249 Family history of ischemic heart disease and other diseases of the circulatory system: Secondary | ICD-10-CM

## 2020-02-04 DIAGNOSIS — I70229 Atherosclerosis of native arteries of extremities with rest pain, unspecified extremity: Secondary | ICD-10-CM

## 2020-02-04 DIAGNOSIS — R531 Weakness: Secondary | ICD-10-CM | POA: Diagnosis not present

## 2020-02-04 DIAGNOSIS — Z7401 Bed confinement status: Secondary | ICD-10-CM | POA: Diagnosis not present

## 2020-02-04 DIAGNOSIS — W19XXXA Unspecified fall, initial encounter: Secondary | ICD-10-CM | POA: Diagnosis not present

## 2020-02-04 LAB — URINALYSIS, ROUTINE W REFLEX MICROSCOPIC
Bacteria, UA: NONE SEEN
Bilirubin Urine: NEGATIVE
Glucose, UA: NEGATIVE mg/dL
Ketones, ur: NEGATIVE mg/dL
Leukocytes,Ua: NEGATIVE
Nitrite: NEGATIVE
Protein, ur: 30 mg/dL — AB
Specific Gravity, Urine: 1.01 (ref 1.005–1.030)
pH: 5 (ref 5.0–8.0)

## 2020-02-04 LAB — BASIC METABOLIC PANEL
Anion gap: 10 (ref 5–15)
BUN: 56 mg/dL — ABNORMAL HIGH (ref 8–23)
CO2: 22 mmol/L (ref 22–32)
Calcium: 8.5 mg/dL — ABNORMAL LOW (ref 8.9–10.3)
Chloride: 93 mmol/L — ABNORMAL LOW (ref 98–111)
Creatinine, Ser: 4.15 mg/dL — ABNORMAL HIGH (ref 0.61–1.24)
GFR calc Af Amer: 15 mL/min — ABNORMAL LOW (ref >60)
GFR calc non Af Amer: 13 mL/min — ABNORMAL LOW (ref >60)
Glucose, Bld: 125 mg/dL — ABNORMAL HIGH (ref 70–99)
Potassium: 5.5 mmol/L — ABNORMAL HIGH (ref 3.5–5.1)
Sodium: 125 mmol/L — ABNORMAL LOW (ref 135–145)

## 2020-02-04 LAB — CBC
HCT: 30.3 % — ABNORMAL LOW (ref 39.0–52.0)
Hemoglobin: 10.1 g/dL — ABNORMAL LOW (ref 13.0–17.0)
MCH: 25.3 pg — ABNORMAL LOW (ref 26.0–34.0)
MCHC: 33.3 g/dL (ref 30.0–36.0)
MCV: 75.8 fL — ABNORMAL LOW (ref 80.0–100.0)
Platelets: 475 10*3/uL — ABNORMAL HIGH (ref 150–400)
RBC: 4 MIL/uL — ABNORMAL LOW (ref 4.22–5.81)
RDW: 19.1 % — ABNORMAL HIGH (ref 11.5–15.5)
WBC: 15.5 10*3/uL — ABNORMAL HIGH (ref 4.0–10.5)
nRBC: 0.4 % — ABNORMAL HIGH (ref 0.0–0.2)

## 2020-02-04 LAB — BRAIN NATRIURETIC PEPTIDE: B Natriuretic Peptide: 1863.4 pg/mL — ABNORMAL HIGH (ref 0.0–100.0)

## 2020-02-04 MED ORDER — SODIUM CHLORIDE 0.9 % IV BOLUS
250.0000 mL | Freq: Once | INTRAVENOUS | Status: AC
  Administered 2020-02-04: 250 mL via INTRAVENOUS

## 2020-02-04 MED ORDER — SODIUM CHLORIDE 0.9% FLUSH
3.0000 mL | Freq: Once | INTRAVENOUS | Status: AC
  Administered 2020-02-04: 3 mL via INTRAVENOUS

## 2020-02-04 NOTE — ED Triage Notes (Signed)
Patient arrives to ED with complaints of increased weakness over the weekend. Patient states that he has gotten so weak he is unable to get around the house without getting SOB or without a walker. Patient states that his toe on his left foot could be infected.

## 2020-02-04 NOTE — ED Notes (Signed)
Pt in XRAY 

## 2020-02-04 NOTE — Patient Outreach (Signed)
[facility]) Care Management  [place]  02/04/2020   Logan White 05-17-1941 [id number]    Telephone Assessment   Referral received : 01/31/20 Referral source: [facility] discharge notification  Date of Admission :01/22/20 Diagnosis: Heart Failure  Date of Discharge: 01/30/20 Facility : [place]: [facility]   Subjective:  Received voicemail message from patient wife, with concerns regarding patient condition. Returned call to patient wife,Logan White. Wife discussed concerns regarding patient , she discussed patient 'really talking confused especially over the weekend". She reports on Saturday , [facility] visited was concerned over wound at toes, wound was cleaned and dressing placed , per wife [facility]  reporting wound looks worse than previous and would need redressing by midweek.  Wife discussed patient with increased weakness, hurting with walking, she has been able to assist to bathroom. She reports patient had fall from commode to over to tub  ,denies hitting his head, she called there son to assist up.  Wife reports calling paramedics patient was evaluated, vital signs good,  patient answered all questions appropriately he declined going to hospital.  She discussed patient having  episodes of vomiting on yesterday , she states  patient ate  2 roast beef sandwiches through out the day yesterday. She reports no vomiting this morning . She reports she was able to give patient medications on this morning. She denies patient having signs of fever, dressing in place to toe wound area, she has not observed closely to report redness at area , he denies  shortness of breath, she states no  increase in swelling , he is wearing oxygen on and off, and slept  with oxygen on last night. She reports patient sitting on side of bed at present .    Encounter Medications:  Outpatient Encounter Medications as of 02/04/2020  Medication Sig  . acetaminophen (TYLENOL) 325  MG tablet Take 650 mg by mouth every 6 (six) hours as needed for mild pain.   Marland Kitchen albuterol (VENTOLIN HFA) 108 (90 Base) MCG/ACT inhaler Inhale 2 puffs into the lungs every 6 (six) hours as needed for wheezing or shortness of breath.  Marland Kitchen atorvastatin (LIPITOR) 80 MG tablet TAKE 1 TABLET BY MOUTH EVERY DAY (Patient taking differently: Take 80 mg by mouth daily. )  . Camphor-Menthol (SARNA EX) Apply 1 application topically daily as needed (itching).  Marland Kitchen ELIQUIS 5 MG TABS tablet TAKE 1 TABLET BY MOUTH TWICE A DAY (Patient taking differently: Take 5 mg by mouth 2 (two) times daily. )  . Ensure (ENSURE) Take 237 mLs by mouth daily.   . ferrous sulfate 325 (65 FE) MG tablet Take 325 mg by mouth 2 (two) times daily.  . Fluticasone-Salmeterol (ADVAIR DISKUS) 250-50 MCG/DOSE AEPB Inhale 1 puff into the lungs 2 (two) times daily for 14 days. (Patient taking differently: Inhale 1 puff into the lungs 2 (two) times daily as needed (shortness of breath). )  . gabapentin (NEURONTIN) 300 MG capsule Take 300 mg by mouth at bedtime.   . hydrALAZINE (APRESOLINE) 10 MG tablet TAKE 1 TABLET BY MOUTH TWICE A DAY (Patient taking differently: Take 10 mg by mouth 2 (two) times daily. )  . hydrOXYzine (VISTARIL) 25 MG capsule Take 25 mg by mouth every 8 (eight) hours as needed for itching.   . isosorbide mononitrate (IMDUR) 30 MG 24 hr tablet Take 1 tablet (30 mg total) by mouth daily.  Marland Kitchen KLOR-CON M20 20 MEQ tablet TAKE 1 TABLET BY MOUTH TWICE A DAY (  Patient taking differently: Take 20 mEq by mouth 2 (two) times daily. )  . metoprolol succinate (TOPROL-XL) 25 MG 24 hr tablet Take 0.5 tablets (12.5 mg total) by mouth at bedtime. Take with or immediately following a meal.  . nitroGLYCERIN (NITROSTAT) 0.4 MG SL tablet Place 1 tablet (0.4 mg total) under the tongue every 5 (five) minutes as needed for chest pain (Up to 3 doses).  Marland Kitchen oxymetazoline (AFRIN) 0.05 % nasal spray Place 1 spray into both nostrils 2 (two) times daily as needed  for congestion.  . pantoprazole (PROTONIX) 40 MG tablet Take 1 tablet (40 mg total) by mouth 2 (two) times daily.  Marland Kitchen spironolactone (ALDACTONE) 25 MG tablet Take 1 tablet (25 mg total) by mouth daily.  Marland Kitchen torsemide (DEMADEX) 100 MG tablet Take 1 tablet (100 mg total) by mouth 2 (two) times daily.   No facility-administered encounter medications on file as of 02/04/2020.    Functional Status:  In your present state of health, do you have any difficulty performing the following activities: 01/22/2020 12/10/2019  Hearing? N N  Vision? N N  Difficulty concentrating or making decisions? N N  Walking or climbing stairs? Y Y  Comment - using walker  Dressing or bathing? N Y  Comment - wife helps  Doing errands, shopping? Y Y  Comment - wife provides Copywriter, advertising and eating ? - N  Comment - wife prepares meals  Using the Toilet? - N  In the past six months, have you accidently leaked urine? - N  Do you have problems with loss of bowel control? - N  Managing your Medications? - Y  Comment - wife assist  Managing your Finances? - N  Housekeeping or managing your Housekeeping? - N  Comment - wife manages  Some recent data might be hidden    Fall/Depression Screening: Fall Risk  01/01/2020 12/17/2019  Falls in the past year? 0 0  Number falls in past yr: 0 -  Risk for fall due to : Impaired balance/gait Impaired balance/gait  Follow up Falls prevention discussed Falls prevention discussed   PHQ 2/9 Scores 12/17/2019  PHQ - 2 Score 0    Assessment   Concerns regarding : Wound at toes, patient with confusion , weakness.  Patient will benefit from [facility] acute visit on today for evaluation of current concerns.   Social  Patient lives at home with wife as main support, she may benefit from  additional support of bath aide.     Plan:  Placed call to [facility]  , spoke with nurse Lattie Haw to update on wife report of patient condition concerns.  Lattie Haw will be able  to visit patient today between 4 and 4:30.  Return call to patient wife to inform her nurse visit this afternoon, verified wife has [facility] contact number to contact sooner if new concerns.  Reviewed worsening symptom to seek medical attention for sooner.  Will plan return call in the next 4 business days to assess for ongoing care needs.  Placed call to [place] health to collaborate regarding adding bath aide to home health services, able to leave a message for return call.    THN CM Care Plan Problem One     Most Recent Value  Care Plan Problem One  High risk for readmission related to recent hospitalization for Heart failure exaceration   Role Documenting the Problem One  Care Management [place] for Problem One  Active  THN Long Term Goal   Patient will experience hospital readmission due Heart failure over the next 60 days   THN Long Term Goal Start Date  01/31/20  Interventions for Problem One Long Term Goal  Discussed concerns regarding patient current condition, emphasized importance in contacting Remote [facility] for concerns in change in condition, discussed their 24 hour accessiibility medical services support they can provide.  Teachback on contact number. Reveiwed worsening symptoms to seek medical condition for, increased shortness of breath, weakness, confusion, signs of infection fever.    [facility]N CM Short Term Goal #1   Patient will attend all medical appointments over the next 30 days   [facility] CM Short Term Goal #1 Start Date  01/31/20  Interventions for Short Term Goal #1  Reinforced upcoming appointments and resceduling PCP appointment , wife request to address after today's concerns addressed.   THN CM Short Term Goal #2   Over the next 30 days patient will begin to weight at least 3 days a week and keep a record   [facility] CM Short Term Goal #2 Start Date  01/07/20       Joylene Draft, RN, BSN  [place] Management Coordinator  [phone]-  Mobile [phone]- [place]

## 2020-02-04 NOTE — ED Provider Notes (Addendum)
[facility] EMERGENCY DEPARTMENT Provider Note   CSN: [id number] Arrival date & time: 02/04/20  1753     History Chief Complaint  Patient presents with  . Weakness    Logan White is a 79 y.o. male with history of CHF EF 20-30%, CAD, PAD, CKD, HTN, CVA who presents with left foot pain. The patient states that over the past 2 weeks he has had progressively worsening pain in his left foot. The pain is across his toes and shoots up his leg. He also has pain in his right toes but it's not as bad. He is normally ambulatory without any assistive devices but in the past 2 weeks he has had to use a cane and walker. Tonight he fell when trying to go to the bathroom due to severe pain and so he came to the ED. He denies fever, chills, low back pain, radicular pain, weakness, CP/SOB, abdominal pain, N/V/D, urinary symptoms. He was discharged from the hospital a couple days ago after being admitted by vascular surgery. It was planned that he would undergo bilateral femoral endarterectomies followed by fem-pop bypass however he decompensated while admitted and after multiple discussions with vascular surgery and cards he decided to not undergo these procedures.   HPI     Past Medical History:  Diagnosis Date  . AAA (abdominal aortic aneurysm) ([place])    a. 08/2015 Abd U/S: 2.7 x 2.9 x 3.2 cm infrarenal AAA.  . Bradycardia    a. Requiring discontinuation of use of BB/CCB  . Cardiac arrest - ventricular fibrillation    a. 03/2012 in setting of hypokalemia (prolonged hosp with VDRF, tracheobronchitis, ARF, shock liver, PAF, AMS felt secondary to post-anoxic encephalopathy/shock).  . Carotid artery stenosis    a. 01/2011 - 40-59% bilateral stenosis;  b. 06/2015 Carotid U/S: 40-59% bilat ICA stenosis->f/u 6 mos.  . CHF (congestive heart failure) ([place])   . CKD (chronic kidney disease), stage III   . Coronary artery disease    a. s/p aborted ant STEMI tx with Cypher DES to LAD 10/04  (residual at cath: D1 50%, CFX 40% and multiple dist 70%, EF 55%);   b. myoview 3/10: Ef 47%, infero-apical isch, LOW RISK - med Tx recommended;  c. 12/2012 VF Arrest/Cath: LAD 40isr, 80 apical, LCX 100 (failed PTCA), RCA nondom.  . Diabetes mellitus   . Diverticulosis 2001  . Elevated LFTs    Shock liver 03/2012  . H/O: CVA (cardiovascular accident)   . Hematemesis    a. 12/2010 felt 2/2 Mallory Weiss tear - pt could not afford colonscopy/EGD at that time so Coumadin was deferred. Coumadin initiated 03/2012 without any evidence for bleeding.  Marland Kitchen History of Ischemic Cardiomyopathy    a. EF 50-55% by echo 03/09/12 (was 30% by echo 02/24/12); b. 03/2013 Echo: EF 55-60%, mild MR, sev dil LA, mod dil RA, PASP 48mmHg.  Marland Kitchen Hypertensive heart disease    a. echo 4/12: EF 50%, asymmetric septal hypertrophy, no SAM or LVOT gradient, LAE, PASP 35  . Inguinal hernia   . 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 Iron deficiency anemia   . Mitral regurgitation    a. Mild by echo 03/2012 and 03/2013.  . Mixed Hyperlipidemia   . Peripheral vascular disease ([place])    a. h/o LE angioplasty;  b. 08/2015 Duplex: >50% RCIA, 100% REIA, >50% LEIA, elev vel in SMA, patent IVC.  Marland Kitchen Persistent atrial fibrillation ([place])    a. Dx 12/2010-->coumadin (CHA2DS2VASc =  7).  . QT prolongation   . Tubular adenoma of colon 11/2019    Patient Active Problem List   Diagnosis Date Noted  . Acute on chronic systolic CHF (congestive heart failure), NYHA class 3 ([place]) 01/22/2020  . Acute on chronic combined systolic and diastolic CHF (congestive heart failure) ([place]) 11/25/2019  . Palliative care by specialist   . Goals of care, counseling/discussion   . PVD (peripheral vascular disease) ([place]) 11/15/2019  . Gastritis and gastroduodenitis   . Benign neoplasm of transverse colon   . Microcytic anemia   . Acute blood loss anemia   . Melena   . CHF (congestive heart failure) ([place]) 11/04/2019  . CHF  (congestive heart failure), NYHA class IV, acute, systolic ([place]) 03/04/1600  . Persistent atrial fibrillation ([place])   . Acute on chronic systolic CHF (congestive heart failure) ([place]) 06/22/2019  . Acute on chronic systolic (congestive) heart failure ([place]) 06/22/2019  . CAD S/P PCI 10/13/2015  . Cardiomyopathy, new. Etiology apeears to be NICM 10/13/2015  . AAA (abdominal aortic aneurysm) ([place])   . Peripheral vascular disease ([place])   . Carotid artery stenosis   . Hypertensive heart disease   . Bruit 10/24/2014  . Acute on chronic renal failure ([place]) 01/11/2013  . Acute MI, anterolateral wall, initial episode of care ([place]) 12/31/2012  . Cardiac arrest due to underlying cardiac condition ([place]) 12/31/2012  . VF (ventricular fibrillation) ([place]) 12/31/2012  . Abnormal LFTs 12/31/2012  . Acute on chronic renal insufficiency 12/31/2012  . Chronic anticoagulation 12/31/2012  . Hypokalemia 12/31/2012  . Weakness 04/23/2012  . Long term (current) use of anticoagulants 03/19/2012  . CKD (chronic kidney disease), stage III 03/16/2012  . Acute confusion/delerium 03/12/2012  . Delirium 03/12/2012  . Acute systolic CHF (congestive heart failure) ([place]) 03/11/2012  . Septic shock(785.52) 02/27/2012    Class: Acute  . Cardiogenic shock ([place]) 02/25/2012  . Acute renal failure ([place]) 02/25/2012  . Anoxic encephalopathy ([place]) 02/25/2012  . Ischemic cardiomyopathy 02/25/2012  . Cardiac arrest ([place]) 02/23/2012  . Acute respiratory failure with hypoxia ([place]) 02/23/2012  . Ventricular fibrillation ([place]) 02/23/2012  . DM (diabetes mellitus) ([place]) 02/23/2012  . Screening for colon cancer 02/10/2011  . Hematemesis 01/13/2011  . Dyspnea 01/13/2011  . RENAL INSUFFICIENCY 08/26/2010  . HYPERPOTASSEMIA 09/15/2009  . TOBACCO ABUSE 08/11/2009  . GLUCOSE INTOLERANCE 10/02/2008  . Hyperlipidemia 10/02/2008  . Essential hypertension 10/02/2008  . Cerebrovascular disease 10/02/2008  . PERIPHERAL VASCULAR  DISEASE 10/02/2008  . INGUINAL HERNIA 10/02/2008    Past Surgical History:  Procedure Laterality Date  . ABDOMINAL AORTOGRAM N/A 11/19/2019   Procedure: ABDOMINAL AORTOGRAM;  Surgeon: Serafina Mitchell, MD;  Location: [facility] CV LAB;  Service: Cardiovascular;  Laterality: N/A;  . BIOPSY  11/07/2019   Procedure: BIOPSY;  Surgeon: [name], MD;  Location: [facility] ENDOSCOPY;  Service: Endoscopy;;  . CENTRAL LINE INSERTION  11/28/2019   Procedure: CENTRAL LINE INSERTION;  Surgeon: Jolaine Artist, MD;  Location: [place] CV LAB;  Service: Cardiovascular;;  . COLONOSCOPY WITH PROPOFOL N/A 11/07/2019   Procedure: COLONOSCOPY WITH PROPOFOL;  Surgeon: [name], MD;  Location: [facility] ENDOSCOPY;  Service: Endoscopy;  Laterality: N/A;  . ESOPHAGOGASTRODUODENOSCOPY (EGD) WITH PROPOFOL N/A 11/07/2019   Procedure: ESOPHAGOGASTRODUODENOSCOPY (EGD) WITH PROPOFOL;  Surgeon: [name], MD;  Location: [facility] ENDOSCOPY;  Service: Endoscopy;  Laterality: N/A;  . HEMOSTASIS CLIP PLACEMENT  11/07/2019   Procedure: HEMOSTASIS CLIP PLACEMENT;  Surgeon: [name], MD;  Location:  [facility] ENDOSCOPY;  Service: Endoscopy;;  . HERNIA REPAIR    . LEFT HEART CATH N/A 12/30/2012   Procedure: LEFT HEART CATH;  Surgeon: [name], MD;  Location: [facility] CATH LAB;  Service: Cardiovascular;  Laterality: N/A;  . LOWER EXTREMITY ANGIOGRAPHY Bilateral 11/19/2019   Procedure: Lower Extremity Angiography;  Surgeon: Serafina Mitchell, MD;  Location: [place] [facility] CV LAB;  Service: Cardiovascular;  Laterality: Bilateral;  . ORIF TIBIA & FIBULA FRACTURES  01/11/07   OPEN TX OF UNICONDYLAR PLATEAU FRACTURE, IRRIGATION/DEBRIDEMENT OF OPEN FRACTURE INCLUDING BONE, REMOVAL OF EXTERNAL FIXATOR UNDER ANESTHESIA PER DR. MICHAEL HANDY  . PERIPHERAL VASCULAR CATHETERIZATION N/A 12/21/2015   Procedure: Lower Extremity Angiography;  Surgeon: [name], MD;  Location: [place] CV LAB;  Service: Cardiovascular;  Laterality: N/A;  .  POLYPECTOMY  11/07/2019   Procedure: POLYPECTOMY;  Surgeon: [name], MD;  Location: [facility];  Service: Endoscopy;;  . POPLITEAL ARTERY ANGIOPLASTY  01/07/07   s/p LEFT POPLITEAL ARTERY EXPLORATION AND VEIN PATCH ANGIOPLASTY, PER DR. EARLY, SECONDARY TO ISCHEMIC LEFT FOOT RELATED TO LEFT POPLITEAL ARTERY INJURY  . RIGHT HEART CATH N/A 10/02/2019   Procedure: RIGHT HEART CATH;  Surgeon: [name], MD;  Location: [place] CV LAB;  Service: Cardiovascular;  Laterality: N/A;  . RIGHT HEART CATH N/A 11/28/2019   Procedure: RIGHT HEART CATH;  Surgeon: Jolaine Artist, MD;  Location: [facility] CV LAB;  Service: Cardiovascular;  Laterality: N/A;  . RIGHT HEART CATH N/A 01/24/2020   Procedure: RIGHT HEART CATH;  Surgeon: Jolaine Artist, MD;  Location: [place] CV LAB;  Service: Cardiovascular;  Laterality: N/A;  . SKIN GRAFT    . TEE WITHOUT CARDIOVERSION N/A 08/08/2019   Procedure: TRANSESOPHAGEAL ECHOCARDIOGRAM (TEE);  Surgeon: Lelon Perla, MD;  Location: [facility] ENDOSCOPY;  Service: Cardiovascular;  Laterality: N/A;       Family History  Problem Relation Age of Onset  . Heart disease Father        ALSO UNCLE DIED HAD CAD  . Heart failure Father   . Brain cancer Mother   . Leukemia Mother   . Sickle cell anemia Mother   . Sickle cell anemia Other     Social History   Tobacco Use  . Smoking status: Former Smoker    Packs/day: 0.50    Years: 30.00    Pack years: 15.00    Types: Cigarettes    Quit date: 03/12/2012    Years since quitting: 7.9  . Smokeless tobacco: Never Used  Substance Use Topics  . Alcohol use: No    Alcohol/week: 0.0 standard drinks  . Drug use: No    Frequency: 2.0 times per week    Home Medications Prior to Admission medications   Medication Sig Start Date End Date Taking? Authorizing Provider  acetaminophen (TYLENOL) 325 MG tablet Take 650 mg by mouth every 6 (six) hours as needed for mild pain.     [provider]    albuterol (VENTOLIN HFA) 108 (90 Base) MCG/ACT inhaler Inhale 2 puffs into the lungs every 6 (six) hours as needed for wheezing or shortness of breath. 08/26/19   Lelon Perla, MD  atorvastatin (LIPITOR) 80 MG tablet TAKE 1 TABLET BY MOUTH EVERY DAY Patient taking differently: Take 80 mg by mouth daily.  11/26/19   Lelon Perla, MD  Camphor-Menthol [facility] EX) Apply 1 application topically daily as needed (itching).    [provider]  ELIQUIS 5 MG  TABS tablet TAKE 1 TABLET BY MOUTH TWICE A DAY Patient taking differently: Take 5 mg by mouth 2 (two) times daily.  01/13/20   Bensimhon, Shaune Pascal, MD  Ensure (ENSURE) Take 237 mLs by mouth daily.     [provider]  ferrous sulfate 325 (65 FE) MG tablet Take 325 mg by mouth 2 (two) times daily. 01/02/20   [provider]  Fluticasone-Salmeterol (ADVAIR DISKUS) 250-50 MCG/DOSE AEPB Inhale 1 puff into the lungs 2 (two) times daily for 14 days. Patient taking differently: Inhale 1 puff into the lungs 2 (two) times daily as needed (shortness of breath).  10/06/19 01/22/20  Kayleen Memos, DO  gabapentin (NEURONTIN) 300 MG capsule Take 300 mg by mouth at bedtime.  01/16/20   [provider]  hydrALAZINE (APRESOLINE) 10 MG tablet TAKE 1 TABLET BY MOUTH TWICE A DAY Patient taking differently: Take 10 mg by mouth 2 (two) times daily.  12/16/19   Lelon Perla, MD  hydrOXYzine (VISTARIL) 25 MG capsule Take 25 mg by mouth every 8 (eight) hours as needed for itching.  01/16/20   [provider]  isosorbide mononitrate (IMDUR) 30 MG 24 hr tablet Take 1 tablet (30 mg total) by mouth daily. 11/08/19   Domenic Polite, MD  KLOR-CON M20 20 MEQ tablet TAKE 1 TABLET BY MOUTH TWICE A DAY Patient taking differently: Take 20 mEq by mouth 2 (two) times daily.  01/08/20   Fulp, Cammie, MD  metoprolol succinate (TOPROL-XL) 25 MG 24 hr tablet Take 0.5 tablets (12.5 mg total) by mouth at bedtime. Take with or immediately following  a meal. 12/26/19 03/25/20  Clegg, Amy D, NP  nitroGLYCERIN (NITROSTAT) 0.4 MG SL tablet Place 1 tablet (0.4 mg total) under the tongue every 5 (five) minutes as needed for chest pain (Up to 3 doses). 12/26/19   Clegg, Amy D, NP  oxymetazoline (AFRIN) 0.05 % nasal spray Place 1 spray into both nostrils 2 (two) times daily as needed for congestion.    [provider]  pantoprazole (PROTONIX) 40 MG tablet Take 1 tablet (40 mg total) by mouth 2 (two) times daily. 01/02/20   Argentina Donovan, PA-C  spironolactone (ALDACTONE) 25 MG tablet Take 1 tablet (25 mg total) by mouth daily. 01/02/20   Argentina Donovan, PA-C  torsemide (DEMADEX) 100 MG tablet Take 1 tablet (100 mg total) by mouth 2 (two) times daily. 12/08/19 06/05/20  Conrad [place], NP    Allergies    Patient has no known allergies.  Review of Systems   Review of Systems  Constitutional: Negative for chills and fever.  Respiratory: Negative for shortness of breath.   Cardiovascular: Negative for chest pain.  Gastrointestinal: Negative for abdominal pain.  Musculoskeletal: Positive for arthralgias. Negative for back pain.  Skin: Positive for wound.  Neurological: Negative for weakness and numbness.  All other systems reviewed and are negative.   Physical Exam Updated Vital Signs BP 106/62 (BP Location: Right Arm)   Pulse 73   Temp 97.7 F (36.5 C) (Axillary)   Resp 15   SpO2 98%   Physical Exam Vitals and nursing note reviewed.  Constitutional:      General: He is not in acute distress.    Appearance: Normal appearance. He is well-developed. He is not ill-appearing.     Comments: Chronically ill appearing male in NAD  HENT:     Head: Normocephalic and atraumatic.  Eyes:     General: No scleral icterus.  Right eye: No discharge.        Left eye: No discharge.     Conjunctiva/sclera: Conjunctivae normal.     Pupils: Pupils are equal, round, and reactive to light.  Cardiovascular:     Rate and Rhythm: Normal rate  and regular rhythm.  Pulmonary:     Effort: Pulmonary effort is normal. No respiratory distress.     Breath sounds: Rhonchi (scattered) present.  Abdominal:     General: There is no distension.     Palpations: Abdomen is soft.     Tenderness: There is no abdominal tenderness.  Musculoskeletal:     Cervical back: Normal range of motion.     Comments: Left foot: Necrotic 2nd toe with purulent drainage at the base and around the 2nd metatarsal. Shallow ulceration of the great toe  Unable to palpate pedal pulses bilaterally  Skin:    General: Skin is warm and dry.  Neurological:     Mental Status: He is alert and oriented to person, place, and time.  Psychiatric:        Behavior: Behavior normal.         ED Results / Procedures / Treatments   Labs (all labs ordered are listed, but only abnormal results are displayed) Labs Reviewed  BASIC METABOLIC PANEL - Abnormal; Notable for the following components:      Result Value   Sodium 125 (*)    Potassium 5.5 (*)    Chloride 93 (*)    Glucose, Bld 125 (*)    BUN 56 (*)    Creatinine, Ser 4.15 (*)    Calcium 8.5 (*)    GFR calc non Af Amer 13 (*)    GFR calc Af Amer 15 (*)    All other components within normal limits  CBC - Abnormal; Notable for the following components:   WBC 15.5 (*)    RBC 4.00 (*)    Hemoglobin 10.1 (*)    HCT 30.3 (*)    MCV 75.8 (*)    MCH 25.3 (*)    RDW 19.1 (*)    Platelets 475 (*)    nRBC 0.4 (*)    All other components within normal limits  URINALYSIS, ROUTINE W REFLEX MICROSCOPIC - Abnormal; Notable for the following components:   Hgb urine dipstick SMALL (*)    Protein, ur 30 (*)    All other components within normal limits  BRAIN NATRIURETIC PEPTIDE - Abnormal; Notable for the following components:   B Natriuretic Peptide 1,863.4 (*)    All other components within normal limits  SARS CORONAVIRUS 2 BY RT PCR (HOSPITAL ORDER, [place])  TROPONIN I (HIGH  SENSITIVITY)  TROPONIN I (HIGH SENSITIVITY)    EKG None  Radiology DG Chest 2 View  Result Date: 02/04/2020 CLINICAL DATA:  79 year old male with shortness of breath. EXAM: CHEST - 2 VIEW COMPARISON:  Chest radiograph dated 01/27/2020. FINDINGS: Interval removal of the right IJ central venous line. There is mild diffuse interstitial edema. No consolidative changes. There is no pleural effusion pneumothorax. Stable moderate enlargement of the cardiopericardial silhouette. Atherosclerotic calcification of the aorta. Coronary vascular calcification or stent. No acute osseous pathology. IMPRESSION: 1. Interval removal of the right IJ central venous line. 2. Slight interval improvement of the edema since the prior radiograph. 3. Stable cardiomegaly. Electronically Signed   By: Anner Crete M.D.   On: 02/04/2020 22:46    Procedures Procedures (including critical care time)  CRITICAL CARE  Performed by: Recardo Evangelist   Total critical care time: 35 minutes  Critical care time was exclusive of separately billable procedures and treating other patients.  Critical care was necessary to treat or prevent imminent or life-threatening deterioration.  Critical care was time spent personally by me on the following activities: development of treatment plan with patient and/or surrogate as well as nursing, discussions with consultants, evaluation of patient's response to treatment, examination of patient, obtaining history from patient or surrogate, ordering and performing treatments and interventions, ordering and review of laboratory studies, ordering and review of radiographic studies, pulse oximetry and re-evaluation of patient's condition.   Medications Ordered in ED Medications  sodium chloride flush (NS) 0.9 % injection 3 mL (3 mLs Intravenous Given 02/04/20 2304)  sodium chloride 0.9 % bolus 250 mL (0 mLs Intravenous Stopped 02/05/20 0029)  piperacillin-tazobactam (ZOSYN) IVPB 3.375 g (0 g  Intravenous Stopped 02/05/20 0121)  vancomycin (VANCOCIN) IVPB 1000 mg/200 mL premix (0 mg Intravenous Stopped 02/05/20 0237)    ED Course  I have reviewed the triage vital signs and the nursing notes.  Pertinent labs & imaging results that were available during my care of the patient were reviewed by me and considered in my medical decision making (see chart for details).  79 year old male with known critical limb ischemia who presents with bilateral foot pain. BP is soft which is his baseline. Other vitals are normal. On exam he has a necrotic, infected appearing left 2nd toe. Per chart review this seems to have significantly progressed since he was hospitalized last week. EKG shows A.fib without significant change. CXR shows improvement in pulmonary edema. Labs show acute on chronic kidney disease as well as leukocytosis, polycythemia, and thrombocytosis which may be from hemoconcentration vs infectious etiology. BNP is stable from prior. Shared visit with Dr. Christy Gentles. Pt was asked whether or not he would consider surgery. He states he would like to think about it and talk to his wife. Will start Vancomycin and Zosyn and consult vascular surgery. Pt given 250cc bolus. Xray of the foot does not show any osteomyelitis  Discussed with Dr. Scot Dock - he will have Dr. Trula Slade see in the AM  Discussed with Dr. Marlowe Sax who will admit.  MDM Rules/Calculators/A&P                       Final Clinical Impression(s) / ED Diagnoses Final diagnoses:  Critical lower limb ischemia  Necrotic toes [facility])    Rx / DC Orders ED Discharge Orders    None       Recardo Evangelist, PA-C 02/05/20 0316    Recardo Evangelist, PA-C 02/05/20 0177    Ripley Fraise, MD 02/05/20 [phone]

## 2020-02-05 ENCOUNTER — Inpatient Hospital Stay (HOSPITAL_COMMUNITY): Payer: Medicare HMO

## 2020-02-05 DIAGNOSIS — I96 Gangrene, not elsewhere classified: Secondary | ICD-10-CM | POA: Diagnosis not present

## 2020-02-05 DIAGNOSIS — E11621 Type 2 diabetes mellitus with foot ulcer: Secondary | ICD-10-CM | POA: Diagnosis present

## 2020-02-05 DIAGNOSIS — L0390 Cellulitis, unspecified: Secondary | ICD-10-CM | POA: Diagnosis not present

## 2020-02-05 DIAGNOSIS — I5043 Acute on chronic combined systolic (congestive) and diastolic (congestive) heart failure: Secondary | ICD-10-CM | POA: Diagnosis present

## 2020-02-05 DIAGNOSIS — I2510 Atherosclerotic heart disease of native coronary artery without angina pectoris: Secondary | ICD-10-CM | POA: Diagnosis present

## 2020-02-05 DIAGNOSIS — L03032 Cellulitis of left toe: Secondary | ICD-10-CM

## 2020-02-05 DIAGNOSIS — E871 Hypo-osmolality and hyponatremia: Secondary | ICD-10-CM | POA: Diagnosis present

## 2020-02-05 DIAGNOSIS — E875 Hyperkalemia: Secondary | ICD-10-CM | POA: Diagnosis present

## 2020-02-05 DIAGNOSIS — I5082 Biventricular heart failure: Secondary | ICD-10-CM | POA: Diagnosis present

## 2020-02-05 DIAGNOSIS — Z20822 Contact with and (suspected) exposure to covid-19: Secondary | ICD-10-CM | POA: Diagnosis present

## 2020-02-05 DIAGNOSIS — I714 Abdominal aortic aneurysm, without rupture: Secondary | ICD-10-CM | POA: Diagnosis present

## 2020-02-05 DIAGNOSIS — Z7189 Other specified counseling: Secondary | ICD-10-CM | POA: Diagnosis not present

## 2020-02-05 DIAGNOSIS — D631 Anemia in chronic kidney disease: Secondary | ICD-10-CM | POA: Diagnosis present

## 2020-02-05 DIAGNOSIS — I130 Hypertensive heart and chronic kidney disease with heart failure and stage 1 through stage 4 chronic kidney disease, or unspecified chronic kidney disease: Secondary | ICD-10-CM | POA: Diagnosis present

## 2020-02-05 DIAGNOSIS — I70235 Atherosclerosis of native arteries of right leg with ulceration of other part of foot: Secondary | ICD-10-CM | POA: Diagnosis not present

## 2020-02-05 DIAGNOSIS — E782 Mixed hyperlipidemia: Secondary | ICD-10-CM | POA: Diagnosis present

## 2020-02-05 DIAGNOSIS — I255 Ischemic cardiomyopathy: Secondary | ICD-10-CM | POA: Diagnosis present

## 2020-02-05 DIAGNOSIS — Z515 Encounter for palliative care: Secondary | ICD-10-CM | POA: Diagnosis not present

## 2020-02-05 DIAGNOSIS — L97529 Non-pressure chronic ulcer of other part of left foot with unspecified severity: Secondary | ICD-10-CM | POA: Diagnosis present

## 2020-02-05 DIAGNOSIS — I5021 Acute systolic (congestive) heart failure: Secondary | ICD-10-CM | POA: Diagnosis not present

## 2020-02-05 DIAGNOSIS — I998 Other disorder of circulatory system: Secondary | ICD-10-CM | POA: Diagnosis present

## 2020-02-05 DIAGNOSIS — I70229 Atherosclerosis of native arteries of extremities with rest pain, unspecified extremity: Secondary | ICD-10-CM

## 2020-02-05 DIAGNOSIS — A419 Sepsis, unspecified organism: Secondary | ICD-10-CM

## 2020-02-05 DIAGNOSIS — I70262 Atherosclerosis of native arteries of extremities with gangrene, left leg: Secondary | ICD-10-CM | POA: Diagnosis present

## 2020-02-05 DIAGNOSIS — R6520 Severe sepsis without septic shock: Secondary | ICD-10-CM | POA: Diagnosis present

## 2020-02-05 DIAGNOSIS — N179 Acute kidney failure, unspecified: Secondary | ICD-10-CM | POA: Diagnosis present

## 2020-02-05 DIAGNOSIS — I4821 Permanent atrial fibrillation: Secondary | ICD-10-CM | POA: Diagnosis present

## 2020-02-05 DIAGNOSIS — Z66 Do not resuscitate: Secondary | ICD-10-CM | POA: Diagnosis present

## 2020-02-05 DIAGNOSIS — E1122 Type 2 diabetes mellitus with diabetic chronic kidney disease: Secondary | ICD-10-CM | POA: Diagnosis present

## 2020-02-05 DIAGNOSIS — E1152 Type 2 diabetes mellitus with diabetic peripheral angiopathy with gangrene: Secondary | ICD-10-CM | POA: Diagnosis present

## 2020-02-05 DIAGNOSIS — N183 Chronic kidney disease, stage 3 unspecified: Secondary | ICD-10-CM | POA: Diagnosis present

## 2020-02-05 DIAGNOSIS — I5084 End stage heart failure: Secondary | ICD-10-CM | POA: Diagnosis present

## 2020-02-05 LAB — RENAL FUNCTION PANEL
Albumin: 2.2 g/dL — ABNORMAL LOW (ref 3.5–5.0)
Anion gap: 13 (ref 5–15)
BUN: 59 mg/dL — ABNORMAL HIGH (ref 8–23)
CO2: 19 mmol/L — ABNORMAL LOW (ref 22–32)
Calcium: 8.6 mg/dL — ABNORMAL LOW (ref 8.9–10.3)
Chloride: 94 mmol/L — ABNORMAL LOW (ref 98–111)
Creatinine, Ser: 3.65 mg/dL — ABNORMAL HIGH (ref 0.61–1.24)
GFR calc Af Amer: 18 mL/min — ABNORMAL LOW (ref >60)
GFR calc non Af Amer: 15 mL/min — ABNORMAL LOW (ref >60)
Glucose, Bld: 90 mg/dL (ref 70–99)
Phosphorus: 3.4 mg/dL (ref 2.5–4.6)
Potassium: 5.5 mmol/L — ABNORMAL HIGH (ref 3.5–5.1)
Sodium: 126 mmol/L — ABNORMAL LOW (ref 135–145)

## 2020-02-05 LAB — APTT: aPTT: 130 seconds — ABNORMAL HIGH (ref 24–36)

## 2020-02-05 LAB — CBC
HCT: 28.5 % — ABNORMAL LOW (ref 39.0–52.0)
Hemoglobin: 9.7 g/dL — ABNORMAL LOW (ref 13.0–17.0)
MCH: 25.3 pg — ABNORMAL LOW (ref 26.0–34.0)
MCHC: 34 g/dL (ref 30.0–36.0)
MCV: 74.2 fL — ABNORMAL LOW (ref 80.0–100.0)
Platelets: 369 10*3/uL (ref 150–400)
RBC: 3.84 MIL/uL — ABNORMAL LOW (ref 4.22–5.81)
RDW: 19.5 % — ABNORMAL HIGH (ref 11.5–15.5)
WBC: 13.5 10*3/uL — ABNORMAL HIGH (ref 4.0–10.5)
nRBC: 0.4 % — ABNORMAL HIGH (ref 0.0–0.2)

## 2020-02-05 LAB — HEPARIN LEVEL (UNFRACTIONATED): Heparin Unfractionated: >2.2 IU/mL — ABNORMAL HIGH (ref 0.30–0.70)

## 2020-02-05 LAB — CREATININE, URINE, RANDOM: Creatinine, Urine: 115.68 mg/dL

## 2020-02-05 LAB — SODIUM, URINE, RANDOM: Sodium, Ur: 24 mmol/L

## 2020-02-05 LAB — OSMOLALITY: Osmolality: 284 mOsm/kg (ref 275–295)

## 2020-02-05 LAB — TROPONIN I (HIGH SENSITIVITY): Troponin I (High Sensitivity): 13 ng/L (ref <18)

## 2020-02-05 LAB — SARS CORONAVIRUS 2 BY RT PCR (HOSPITAL ORDER, PERFORMED IN ~~LOC~~ HOSPITAL LAB): SARS Coronavirus 2: NEGATIVE

## 2020-02-05 LAB — LACTIC ACID, PLASMA: Lactic Acid, Venous: 1.6 mmol/L (ref 0.5–1.9)

## 2020-02-05 MED ORDER — HYDROCODONE-ACETAMINOPHEN 5-325 MG PO TABS
1.0000 | ORAL_TABLET | Freq: Four times a day (QID) | ORAL | Status: DC | PRN
  Administered 2020-02-05 – 2020-02-07 (×6): 1 via ORAL
  Filled 2020-02-05 (×6): qty 1

## 2020-02-05 MED ORDER — GABAPENTIN 100 MG PO CAPS
100.0000 mg | ORAL_CAPSULE | Freq: Every day | ORAL | Status: DC
  Administered 2020-02-05 – 2020-02-08 (×4): 100 mg via ORAL
  Filled 2020-02-05 (×4): qty 1

## 2020-02-05 MED ORDER — VANCOMYCIN HCL IN DEXTROSE 1-5 GM/200ML-% IV SOLN
1000.0000 mg | Freq: Once | INTRAVENOUS | Status: AC
  Administered 2020-02-05: 1000 mg via INTRAVENOUS
  Filled 2020-02-05: qty 200

## 2020-02-05 MED ORDER — GABAPENTIN 300 MG PO CAPS: 300.0000 mg | ORAL_CAPSULE | Freq: Every day | ORAL | Status: DC

## 2020-02-05 MED ORDER — TORSEMIDE 20 MG PO TABS
100.0000 mg | ORAL_TABLET | Freq: Every day | ORAL | Status: DC
  Administered 2020-02-06 – 2020-02-10 (×5): 100 mg via ORAL
  Filled 2020-02-05 (×6): qty 5

## 2020-02-05 MED ORDER — SODIUM ZIRCONIUM CYCLOSILICATE 10 G PO PACK: 10.0000 g | PACK | Freq: Once | ORAL | Status: DC

## 2020-02-05 MED ORDER — MOMETASONE FURO-FORMOTEROL FUM 200-5 MCG/ACT IN AERO
2.0000 | INHALATION_SPRAY | Freq: Two times a day (BID) | RESPIRATORY_TRACT | Status: DC
  Administered 2020-02-05 – 2020-02-09 (×8): 2 via RESPIRATORY_TRACT
  Filled 2020-02-05: qty 8.8

## 2020-02-05 MED ORDER — SODIUM CHLORIDE 0.9 % IV SOLN: 250.0000 mL | INTRAVENOUS | Status: DC | PRN

## 2020-02-05 MED ORDER — ACETAMINOPHEN 325 MG PO TABS: 650.0000 mg | ORAL_TABLET | ORAL | Status: DC | PRN

## 2020-02-05 MED ORDER — HEPARIN (PORCINE) 25000 UT/250ML-% IV SOLN
1050.0000 [IU]/h | INTRAVENOUS | Status: DC
  Administered 2020-02-06 – 2020-02-09 (×4): 1050 [IU]/h via INTRAVENOUS
  Filled 2020-02-05 (×4): qty 250

## 2020-02-05 MED ORDER — FERROUS SULFATE 325 (65 FE) MG PO TABS
325.0000 mg | ORAL_TABLET | Freq: Two times a day (BID) | ORAL | Status: DC
  Administered 2020-02-05 – 2020-02-09 (×8): 325 mg via ORAL
  Filled 2020-02-05 (×8): qty 1

## 2020-02-05 MED ORDER — SODIUM CHLORIDE 0.9% FLUSH
3.0000 mL | Freq: Two times a day (BID) | INTRAVENOUS | Status: DC
  Administered 2020-02-05 – 2020-02-09 (×6): 3 mL via INTRAVENOUS

## 2020-02-05 MED ORDER — PIPERACILLIN-TAZOBACTAM 3.375 G IVPB 30 MIN
3.3750 g | Freq: Once | INTRAVENOUS | Status: AC
  Administered 2020-02-05: 3.375 g via INTRAVENOUS
  Filled 2020-02-05: qty 50

## 2020-02-05 MED ORDER — SODIUM CHLORIDE 0.9% FLUSH: 3.0000 mL | INTRAVENOUS | Status: DC | PRN

## 2020-02-05 MED ORDER — PANTOPRAZOLE SODIUM 40 MG PO TBEC
40.0000 mg | DELAYED_RELEASE_TABLET | Freq: Two times a day (BID) | ORAL | Status: DC
  Administered 2020-02-05 – 2020-02-10 (×11): 40 mg via ORAL
  Filled 2020-02-05 (×10): qty 1

## 2020-02-05 MED ORDER — HEPARIN (PORCINE) 25000 UT/250ML-% IV SOLN
1300.0000 [IU]/h | INTRAVENOUS | Status: DC
  Administered 2020-02-05: 1300 [IU]/h via INTRAVENOUS
  Filled 2020-02-05: qty 250

## 2020-02-05 MED ORDER — PIPERACILLIN-TAZOBACTAM IN DEX 2-0.25 GM/50ML IV SOLN
2.2500 g | Freq: Three times a day (TID) | INTRAVENOUS | Status: DC
  Administered 2020-02-05 – 2020-02-07 (×6): 2.25 g via INTRAVENOUS
  Filled 2020-02-05 (×9): qty 50

## 2020-02-05 MED ORDER — MILRINONE LACTATE IN DEXTROSE 20-5 MG/100ML-% IV SOLN
0.2500 ug/kg/min | INTRAVENOUS | Status: DC
  Administered 2020-02-05 – 2020-02-10 (×7): 0.25 ug/kg/min via INTRAVENOUS
  Filled 2020-02-05 (×7): qty 100

## 2020-02-05 MED ORDER — FENTANYL CITRATE (PF) 100 MCG/2ML IJ SOLN: 12.5000 ug | INTRAMUSCULAR | Status: DC | PRN

## 2020-02-05 NOTE — Plan of Care (Signed)

## 2020-02-05 NOTE — ED Notes (Signed)
Pt blood pressures becoming soft. Pt last pressure 104/41. Dr. Marlowe Sax notified.

## 2020-02-05 NOTE — Progress Notes (Signed)
The patient is is currently asleep.  I have been informed that palliative care is getting involved.  Last week, he refused revascularization due to the potential risks of anesthesia to his heart and life.  No further revascularization attempts are planned at this time.  He has had progression of the ulcerations on his toes.  I will follow-up after palliative care has seen him and determine the most appropriate plan  Logan White

## 2020-02-05 NOTE — Plan of Care (Signed)
°  Problem: Education: Goal: Knowledge of General Education information will improve Description: Including pain rating scale, medication(s)/side effects and non-pharmacologic comfort measures Outcome: Progressing   Problem: Clinical Measurements: Goal: Ability to maintain clinical measurements within normal limits will improve Outcome: Progressing   Problem: Clinical Measurements: Goal: Diagnostic test results will improve Outcome: Progressing   Problem: Clinical Measurements: Goal: Cardiovascular complication will be avoided Outcome: Progressing   Problem: Coping: Goal: Level of anxiety will decrease Outcome: Progressing   Problem: Pain Managment: Goal: General experience of comfort will improve Outcome: Progressing   Problem: Safety: Goal: Ability to remain free from injury will improve Outcome: Progressing   Problem: Skin Integrity: Goal: Risk for impaired skin integrity will decrease Outcome: Progressing

## 2020-02-05 NOTE — Progress Notes (Signed)
Pharmacy Antibiotic Note  Logan White is a 78 y.o. male admitted on 02/04/2020 with weakness/wound infection.  Pharmacy has been consulted for Vancomycin/Zosyn dosing. Progressive renal dysfunction. WBC elevated.   Plan: Vancomycin 1000 mg IV x 1, f/u SCr trend for additional dosing Zosyn 2.25g IV q8h Trend WBC, temp, renal function  F/U infectious work-up Drug levels as indicated  Temp (24hrs), Avg:97.7 F (36.5 C), Min:97.7 F (36.5 C), Max:97.7 F (36.5 C)  Recent Labs  Lab 01/30/20 0500 01/30/20 0829 02/04/20 1809  WBC 10.1  --  15.5*  CREATININE  --  2.43* 4.15*    Estimated Creatinine Clearance: 15.8 mL/min (A) (by C-G formula based on SCr of 4.15 mg/dL (H)).    No Known Allergies  Narda Bonds, PharmD, BCPS Clinical Pharmacist Phone: [phone]

## 2020-02-05 NOTE — ED Provider Notes (Signed)
Patient seen/examined in the Emergency Department in conjunction with Advanced Practice Provider [facility] Patient presents with multiple complaints, unable to walk due to weakness and has wounds on his feet.  Pt refused recent surgical management Exam : awake/alert, necrotic wound to 2nd toe of left foot Plan: patient will be admitted to the hospital    Ripley Fraise, MD 02/05/20 [phone]

## 2020-02-05 NOTE — Progress Notes (Signed)
He developed hypotension on 0.25 mcg of milrinone w/ SBP dropping to 84. I checked on pt. He feels ok.  Mentation ok. Discussed w/ Dr. Aundra Dubin. Will reduce milrinone to 0.125 mcg and monitor. If persistent hypotension in the 80s, can stop milrinone. RN notified of plan. \  Ariam Mol SimmonsPA-C 02/05/2020

## 2020-02-05 NOTE — Progress Notes (Signed)
ANTICOAGULATION CONSULT NOTE - Initial Consult  Pharmacy Consult for Heparin (Apixaban on hold) Indication: atrial fibrillation  No Known Allergies  Patient Measurements: Heparin Dosing Weight: 73kg  Vital Signs: Temp: 97.7 F (36.5 C) (06/01 2219) Temp Source: Axillary (06/01 2219) BP: 100/67 (06/02 0415) Pulse Rate: 83 (06/02 0345)  Labs: Recent Labs    02/04/20 1809 02/05/20 0059  HGB 10.1*  --   HCT 30.3*  --   PLT 475*  --   CREATININE 4.15*  --   TROPONINIHS  --  13    Estimated Creatinine Clearance: 15.8 mL/min (A) (by C-G formula based on SCr of 4.15 mg/dL (H)).   Medical History: Past Medical History:  Diagnosis Date  . AAA (abdominal aortic aneurysm) ([place])    a. 08/2015 Abd U/S: 2.7 x 2.9 x 3.2 cm infrarenal AAA.  . Bradycardia    a. Requiring discontinuation of use of BB/CCB  . Cardiac arrest - ventricular fibrillation    a. 03/2012 in setting of hypokalemia (prolonged hosp with VDRF, tracheobronchitis, ARF, shock liver, PAF, AMS felt secondary to post-anoxic encephalopathy/shock).  . Carotid artery stenosis    a. 01/2011 - 40-59% bilateral stenosis;  b. 06/2015 Carotid U/S: 40-59% bilat ICA stenosis->f/u 6 mos.  . CHF (congestive heart failure) ([place])   . CKD (chronic kidney disease), stage III   . Coronary artery disease    a. s/p aborted ant STEMI tx with Cypher DES to LAD 10/04 (residual at cath: D1 50%, CFX 40% and multiple dist 70%, EF 55%);   b. myoview 3/10: Ef 47%, infero-apical isch, LOW RISK - med Tx recommended;  c. 12/2012 VF Arrest/Cath: LAD 40isr, 80 apical, LCX 100 (failed PTCA), RCA nondom.  . Diabetes mellitus   . Diverticulosis 2001  . Elevated LFTs    Shock liver 03/2012  . H/O: CVA (cardiovascular accident)   . Hematemesis    a. 12/2010 felt 2/2 Mallory Weiss tear - pt could not afford colonscopy/EGD at that time so Coumadin was deferred. Coumadin initiated 03/2012 without any evidence for bleeding.  Marland Kitchen History of Ischemic Cardiomyopathy     a. EF 50-55% by echo 03/09/12 (was 30% by echo 02/24/12); b. 03/2013 Echo: EF 55-60%, mild MR, sev dil LA, mod dil RA, PASP 10mmHg.  Marland Kitchen Hypertensive heart disease    a. echo 4/12: EF 50%, asymmetric septal hypertrophy, no SAM or LVOT gradient, LAE, PASP 35  . Inguinal hernia   . 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 Iron deficiency anemia   . Mitral regurgitation    a. Mild by echo 03/2012 and 03/2013.  . Mixed Hyperlipidemia   . Peripheral vascular disease ([place])    a. h/o LE angioplasty;  b. 08/2015 Duplex: >50% RCIA, 100% REIA, >50% LEIA, elev vel in SMA, patent IVC.  Marland Kitchen Persistent atrial fibrillation ([place])    a. Dx 12/2010-->coumadin (CHA2DS2VASc = 7).  . QT prolongation   . Tubular adenoma of colon 11/2019   Assessment: 79 y/o M on apixaban PTA for afib. Holding apixaban and starting heparin in anticipation of surgery. It has been >12 hours since last apixaban dose, will start heparin now. Hgb 9.7. Noted renal dysfunction. Anticipate using aPTT to dose for now.   Goal of Therapy:  Heparin level 0.3-0.7 units/ml aPTT 66-102 seconds Monitor platelets by anticoagulation protocol: Yes   Plan:  Start heparin drip at 1300 units/hr 1400 aPTT/HL Daily CBC/HL/aPTT Monitor for bleeding  Narda Bonds, PharmD, BCPS Clinical Pharmacist Phone: [phone]

## 2020-02-05 NOTE — Progress Notes (Signed)
VASCULAR SURGERY:  This patient is well-known to Dr. Trula Slade. He was readmitted last night with weakness. I spoke with him this morning and there are no significant changes in his lower extremity symptoms. I will notify Dr. Trula Slade of his admission.  Deitra Mayo, MD Office: [phone]

## 2020-02-05 NOTE — ED Notes (Signed)
Pt manual BP 100/42. Dr. Marlowe Sax at bedside and made aware.

## 2020-02-05 NOTE — Progress Notes (Signed)
[facility] PLAN OF CARE NOTE Patient: KEIGAN TAFOYA ELF:[id number]   PCP: Patient, No Pcp Per DOB: 21-Dec-1940   DOA: 02/04/2020   DOS: 02/05/2020    Patient was admitted by my colleague Dr. Marlowe Sax earlier on 02/05/2020. I have reviewed the H&P as well as assessment and plan and agree with the same. Important changes in the plan are listed below.  Plan of care: Principal Problem:   Cellulitis Active Problems:   CHF (congestive heart failure), NYHA class IV, acute, systolic (HCC)   Necrosis of toe (HCC)   Critical lower limb ischemia   Severe sepsis [facility]) Patient refusing surgery for PVD causing progression of gangrene. Vascular surgery consulted Palliative care consult for goals of care discussion.  Acute on chronic systolic CHF. Heart failure services following. Started on [employer]. Continue torsemide.  Hyponatremia Hypervolemic, monitor  Hyperkalemia Monitor on telemetry and progressive care unit [facility] ordered. Renal diet  Author: Berle Mull, MD [facility] 02/05/2020 4:47 PM   If 7PM-7AM, please contact night-coverage at [email or web address]

## 2020-02-05 NOTE — H&P (Addendum)
History and Physical    Logan White GYK:[id number] DOB: November 28, 1940 DOA: 02/04/2020  PCP: Patient, No Pcp Per Patient coming from: Home  Chief Complaint: Generalized weakness  HPI: Logan White is a 79 y.o. 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 secondary to ischemic cardiomyopathy with EF 25 to 30%, CAD status post prior PCI, remote history of VF arrest x2 in 2013 in the setting of hypokalemia and again in 2014 in the setting of MI, no ICD, history of permanent atrial fibrillation on apixaban, CKD stage III-IV, hypertension, CVA, severe lower extremity PAD with known right external iliac artery occlusion with severe infrainguinal arterial occlusive disease and peroneal runoff only and left SFA occlusion with diffuse infrainguinal arterial occlusive disease followed by vascular surgery presenting with complaints of generalized weakness and left foot pain.  Per discharge summary from recent hospitalization, vascular surgery had recommended surgical revascularization once medically cleared.  Patient was admitted by the cardiology service for heart failure optimization.  He decompensated and developed cardiogenic shock during this hospitalization.  As such, he was felt to be high risk for surgery.  After discussions with cardiology and vascular surgery, patient decided not to undergo surgery at that time.  Patient is a poor historian.  Reports having pain bilateral lower extremities for very long time.  He is not sure how long his left second toe has appeared necrotic.  Denies fevers or chills.  Denies weakness, cough, shortness of breath, chest pain, nausea, vomiting, abdominal pain, or diarrhea.  No additional history could be obtained from him.  ED Course: Afebrile.  Not tachycardic.  Hypotensive with systolic in the 79T.  Not tachypneic or hypoxic.  WBC count 15.5.  Hemoglobin 10.1, stable compared to recent labs.  Sodium low  at 125 but stable compared to labs done during recent hospitalization.  Potassium 5.5.  BUN 36, creatinine 4.1.  Baseline creatinine around 2.0.  BNP elevated at 1863.  High-sensitivity troponin negative.  UA not suggestive of infection.  SARS-CoV-2 PCR test negative.  Chest x-ray showing stable cardiomegaly and slight interval improvement of pulmonary edema since prior radiograph.  X-ray of left foot without evidence of osteomyelitis.  Patient was given vancomycin, Zosyn, and 250 cc fluid bolus.  ED provider discussed the case with Dr. Scot Dock who stated that Dr. Trula Slade will see the patient in the morning.  Review of Systems:  All systems reviewed and apart from history of presenting illness, are negative.  Past Medical History:  Diagnosis Date  . AAA (abdominal aortic aneurysm) ([place])    a. 08/2015 Abd U/S: 2.7 x 2.9 x 3.2 cm infrarenal AAA.  . Bradycardia    a. Requiring discontinuation of use of BB/CCB  . Cardiac arrest - ventricular fibrillation    a. 03/2012 in setting of hypokalemia (prolonged hosp with VDRF, tracheobronchitis, ARF, shock liver, PAF, AMS felt secondary to post-anoxic encephalopathy/shock).  . Carotid artery stenosis    a. 01/2011 - 40-59% bilateral stenosis;  b. 06/2015 Carotid U/S: 40-59% bilat ICA stenosis->f/u 6 mos.  . CHF (congestive heart failure) ([place])   . CKD (chronic kidney disease), stage III   . Coronary artery disease    a. s/p aborted ant STEMI tx with Cypher DES to LAD 10/04 (residual at cath: D1 50%, CFX 40% and multiple dist 70%, EF 55%);   b. myoview 3/10: Ef 47%, infero-apical isch, LOW RISK - med Tx recommended;  c. 12/2012 VF Arrest/Cath: LAD 40isr, 80 apical, LCX 100 (  failed PTCA), RCA nondom.  . Diabetes mellitus   . Diverticulosis 2001  . Elevated LFTs    Shock liver 03/2012  . H/O: CVA (cardiovascular accident)   . Hematemesis    a. 12/2010 felt 2/2 Mallory Weiss tear - pt could not afford colonscopy/EGD at that time so Coumadin was deferred.  Coumadin initiated 03/2012 without any evidence for bleeding.  Marland Kitchen History of Ischemic Cardiomyopathy    a. EF 50-55% by echo 03/09/12 (was 30% by echo 02/24/12); b. 03/2013 Echo: EF 55-60%, mild MR, sev dil LA, mod dil RA, PASP 104mmHg.  Marland Kitchen Hypertensive heart disease    a. echo 4/12: EF 50%, asymmetric septal hypertrophy, no SAM or LVOT gradient, LAE, PASP 35  . Inguinal hernia   . 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 Iron deficiency anemia   . Mitral regurgitation    a. Mild by echo 03/2012 and 03/2013.  . Mixed Hyperlipidemia   . Peripheral vascular disease ([place])    a. h/o LE angioplasty;  b. 08/2015 Duplex: >50% RCIA, 100% REIA, >50% LEIA, elev vel in SMA, patent IVC.  Marland Kitchen Persistent atrial fibrillation ([place])    a. Dx 12/2010-->coumadin (CHA2DS2VASc = 7).  . QT prolongation   . Tubular adenoma of colon 11/2019    Past Surgical History:  Procedure Laterality Date  . ABDOMINAL AORTOGRAM N/A 11/19/2019   Procedure: ABDOMINAL AORTOGRAM;  Surgeon: Serafina Mitchell, MD;  Location: [place] [facility];  Service: Cardiovascular;  Laterality: N/A;  . BIOPSY  11/07/2019   Procedure: BIOPSY;  Surgeon: [name], MD;  Location: [facility] ENDOSCOPY;  Service: Endoscopy;;  . CENTRAL LINE INSERTION  11/28/2019   Procedure: CENTRAL LINE INSERTION;  Surgeon: Jolaine Artist, MD;  Location: [place] [facility];  Service: Cardiovascular;;  . COLONOSCOPY WITH PROPOFOL N/A 11/07/2019   Procedure: COLONOSCOPY WITH PROPOFOL;  Surgeon: [name], MD;  Location: [facility] ENDOSCOPY;  Service: Endoscopy;  Laterality: N/A;  . ESOPHAGOGASTRODUODENOSCOPY (EGD) WITH PROPOFOL N/A 11/07/2019   Procedure: ESOPHAGOGASTRODUODENOSCOPY (EGD) WITH PROPOFOL;  Surgeon: [name], MD;  Location: [facility] ENDOSCOPY;  Service: Endoscopy;  Laterality: N/A;  . HEMOSTASIS CLIP PLACEMENT  11/07/2019   Procedure: HEMOSTASIS CLIP PLACEMENT;  Surgeon: [name], MD;  Location: [facility] ENDOSCOPY;  Service:  Endoscopy;;  . HERNIA REPAIR    . LEFT HEART CATH N/A 12/30/2012   Procedure: LEFT HEART CATH;  Surgeon: [name], MD;  Location: [facility] CATH LAB;  Service: Cardiovascular;  Laterality: N/A;  . LOWER EXTREMITY ANGIOGRAPHY Bilateral 11/19/2019   Procedure: Lower Extremity Angiography;  Surgeon: Serafina Mitchell, MD;  Location: [place] [facility];  Service: Cardiovascular;  Laterality: Bilateral;  . ORIF TIBIA & FIBULA FRACTURES  01/11/07   OPEN TX OF UNICONDYLAR PLATEAU FRACTURE, IRRIGATION/DEBRIDEMENT OF OPEN FRACTURE INCLUDING BONE, REMOVAL OF EXTERNAL FIXATOR UNDER ANESTHESIA PER DR. MICHAEL HANDY  . PERIPHERAL VASCULAR CATHETERIZATION N/A 12/21/2015   Procedure: Lower Extremity Angiography;  Surgeon: [name], MD;  Location: [place] [facility];  Service: Cardiovascular;  Laterality: N/A;  . POLYPECTOMY  11/07/2019   Procedure: POLYPECTOMY;  Surgeon: [name], MD;  Location: [place];  Service: Endoscopy;;  . POPLITEAL ARTERY ANGIOPLASTY  01/07/07   s/p LEFT POPLITEAL ARTERY EXPLORATION AND VEIN PATCH ANGIOPLASTY, PER DR. EARLY, SECONDARY TO ISCHEMIC LEFT FOOT RELATED TO LEFT POPLITEAL ARTERY INJURY  . RIGHT HEART CATH N/A 10/02/2019   Procedure: RIGHT HEART CATH;  Surgeon: [name], MD;  Location: [place] [facility];  Service: Cardiovascular;  Laterality: N/A;  . RIGHT HEART CATH N/A 11/28/2019   Procedure: RIGHT HEART CATH;  Surgeon: Jolaine Artist, MD;  Location: [place] [facility];  Service: Cardiovascular;  Laterality: N/A;  . RIGHT HEART CATH N/A 01/24/2020   Procedure: RIGHT HEART CATH;  Surgeon: Jolaine Artist, MD;  Location: [place] [facility];  Service: Cardiovascular;  Laterality: N/A;  . SKIN GRAFT    . TEE WITHOUT CARDIOVERSION N/A 08/08/2019   Procedure: TRANSESOPHAGEAL ECHOCARDIOGRAM (TEE);  Surgeon: Lelon Perla, MD;  Location: [facility] ENDOSCOPY;  Service: Cardiovascular;  Laterality: N/A;     reports that he quit smoking about 7 years ago. His  smoking use included cigarettes. He has a 15.00 pack-year smoking history. He has never used smokeless tobacco. He reports that he does not drink alcohol or use drugs.  No Known Allergies  Family History  Problem Relation Age of Onset  . Heart disease Father        ALSO UNCLE DIED HAD CAD  . Heart failure Father   . Brain cancer Mother   . Leukemia Mother   . Sickle cell anemia Mother   . Sickle cell anemia Other     Prior to Admission medications   Medication Sig Start Date End Date Taking? Authorizing Provider  acetaminophen (TYLENOL) 325 MG tablet Take 650 mg by mouth every 6 (six) hours as needed for mild pain.     [provider]  albuterol (VENTOLIN HFA) 108 (90 Base) MCG/ACT inhaler Inhale 2 puffs into the lungs every 6 (six) hours as needed for wheezing or shortness of breath. 08/26/19   Lelon Perla, MD  atorvastatin (LIPITOR) 80 MG tablet TAKE 1 TABLET BY MOUTH EVERY DAY Patient taking differently: Take 80 mg by mouth daily.  11/26/19   Lelon Perla, MD  Camphor-Menthol [facility] EX) Apply 1 application topically daily as needed (itching).    [provider]  ELIQUIS 5 MG TABS tablet TAKE 1 TABLET BY MOUTH TWICE A DAY Patient taking differently: Take 5 mg by mouth 2 (two) times daily.  01/13/20   Bensimhon, Shaune Pascal, MD  Ensure (ENSURE) Take 237 mLs by mouth daily.     [provider]  ferrous sulfate 325 (65 FE) MG tablet Take 325 mg by mouth 2 (two) times daily. 01/02/20   [provider]  Fluticasone-Salmeterol (ADVAIR DISKUS) 250-50 MCG/DOSE AEPB Inhale 1 puff into the lungs 2 (two) times daily for 14 days. Patient taking differently: Inhale 1 puff into the lungs 2 (two) times daily as needed (shortness of breath).  10/06/19 01/22/20  Kayleen Memos, DO  gabapentin (NEURONTIN) 300 MG capsule Take 300 mg by mouth at bedtime.  01/16/20   [provider]  hydrALAZINE (APRESOLINE) 10 MG tablet TAKE 1 TABLET BY MOUTH TWICE A  DAY Patient taking differently: Take 10 mg by mouth 2 (two) times daily.  12/16/19   Lelon Perla, MD  hydrOXYzine (VISTARIL) 25 MG capsule Take 25 mg by mouth every 8 (eight) hours as needed for itching.  01/16/20   [provider]  isosorbide mononitrate (IMDUR) 30 MG 24 hr tablet Take 1 tablet (30 mg total) by mouth daily. 11/08/19   Domenic Polite, MD  KLOR-CON M20 20 MEQ tablet TAKE 1 TABLET BY MOUTH TWICE A DAY Patient taking differently: Take 20 mEq by mouth 2 (two) times daily.  01/08/20   Fulp, Cammie, MD  metoprolol succinate (TOPROL-XL)  25 MG 24 hr tablet Take 0.5 tablets (12.5 mg total) by mouth at bedtime. Take with or immediately following a meal. 12/26/19 03/25/20  Clegg, Amy D, NP  nitroGLYCERIN (NITROSTAT) 0.4 MG SL tablet Place 1 tablet (0.4 mg total) under the tongue every 5 (five) minutes as needed for chest pain (Up to 3 doses). 12/26/19   Clegg, Amy D, NP  oxymetazoline (AFRIN) 0.05 % nasal spray Place 1 spray into both nostrils 2 (two) times daily as needed for congestion.    [provider]  pantoprazole (PROTONIX) 40 MG tablet Take 1 tablet (40 mg total) by mouth 2 (two) times daily. 01/02/20   Argentina Donovan, PA-C  spironolactone (ALDACTONE) 25 MG tablet Take 1 tablet (25 mg total) by mouth daily. 01/02/20   Argentina Donovan, PA-C  torsemide (DEMADEX) 100 MG tablet Take 1 tablet (100 mg total) by mouth 2 (two) times daily. 12/08/19 06/05/20  Darrick Grinder D, NP    Physical Exam: Vitals:   02/05/20 0317 02/05/20 0330 02/05/20 0345 02/05/20 0400  BP: (!) 104/41 93/62 112/60 91/70  Pulse: (!) 51 67 83   Resp: (!) 26 (!) 27 13   Temp:      TempSrc:      SpO2: 96% 94% 99%     Physical Exam  Constitutional: He is oriented to person, place, and time. He appears well-developed and well-nourished. No distress.  HENT:  Head: Normocephalic.  Eyes: Right eye exhibits no discharge. Left eye exhibits no discharge.  Cardiovascular: Normal rate, regular rhythm and  intact distal pulses.  Pulmonary/Chest: Effort normal and breath sounds normal. No respiratory distress. He has no wheezes. He has no rales.  Abdominal: Soft. Bowel sounds are normal. He exhibits no distension. There is no abdominal tenderness. There is no guarding.  Musculoskeletal:        General: No edema.     Cervical back: Neck supple.     Comments: Left foot: DP and PT pulses appreciated on Doppler.  Foot is warm to touch.  Second toe appears necrotic with purulent drainage. Right foot: Unable to appreciate DP and PT pulses on Doppler.  Foot is cold to touch.  Neurological: He is alert and oriented to person, place, and time.  Skin: Skin is warm and dry. He is not diaphoretic.          Labs on Admission: I have personally reviewed following labs and imaging studies  CBC: Recent Labs  Lab 01/29/20 0417 01/30/20 0500 02/04/20 1809  WBC 9.9 10.1 15.5*  HGB 8.8* 8.7* 10.1*  HCT 24.9* 25.4* 30.3*  MCV 71.8* 72.0* 75.8*  PLT 243 225 409*   Basic Metabolic Panel: Recent Labs  Lab 01/29/20 0417 01/30/20 0829 02/04/20 1809  NA 124* 125* 125*  K 3.7 3.7 5.5*  CL 90* 91* 93*  CO2 22 22 22   GLUCOSE 122* 121* 125*  BUN 29* 35* 56*  CREATININE 2.08* 2.43* 4.15*  CALCIUM 8.0* 8.8* 8.5*   GFR: Estimated Creatinine Clearance: 15.8 mL/min (A) (by C-G formula based on SCr of 4.15 mg/dL (H)). Liver Function Tests: No results for input(s): AST, ALT, ALKPHOS, BILITOT, PROT, ALBUMIN in the last 168 hours. No results for input(s): LIPASE, AMYLASE in the last 168 hours. No results for input(s): AMMONIA in the last 168 hours. Coagulation Profile: No results for input(s): INR, PROTIME in the last 168 hours. Cardiac Enzymes: No results for input(s): CKTOTAL, CKMB, CKMBINDEX, TROPONINI in the last 168 hours. BNP (last 3 results)  Recent Labs    08/15/19 0949  PROBNP 16,511*   HbA1C: No results for input(s): HGBA1C in the last 72 hours. CBG: No results for input(s): GLUCAP in  the last 168 hours. Lipid Profile: No results for input(s): CHOL, HDL, LDLCALC, TRIG, CHOLHDL, LDLDIRECT in the last 72 hours. Thyroid Function Tests: No results for input(s): TSH, T4TOTAL, FREET4, T3FREE, THYROIDAB in the last 72 hours. Anemia Panel: No results for input(s): VITAMINB12, FOLATE, FERRITIN, TIBC, IRON, RETICCTPCT in the last 72 hours. Urine analysis:    Component Value Date/Time   COLORURINE YELLOW 02/04/2020 2319   APPEARANCEUR CLEAR 02/04/2020 2319   LABSPEC 1.010 02/04/2020 2319   PHURINE 5.0 02/04/2020 2319   GLUCOSEU NEGATIVE 02/04/2020 2319   HGBUR SMALL (A) 02/04/2020 2319   BILIRUBINUR NEGATIVE 02/04/2020 2319   [place] 02/04/2020 2319   PROTEINUR 30 (A) 02/04/2020 2319   UROBILINOGEN 0.2 03/23/2013 1740   NITRITE NEGATIVE 02/04/2020 2319   LEUKOCYTESUR NEGATIVE 02/04/2020 2319    Radiological Exams on Admission: DG Chest 2 View  Result Date: 02/04/2020 CLINICAL DATA:  79 year old male with shortness of breath. EXAM: CHEST - 2 VIEW COMPARISON:  Chest radiograph dated 01/27/2020. FINDINGS: Interval removal of the right IJ central venous line. There is mild diffuse interstitial edema. No consolidative changes. There is no pleural effusion pneumothorax. Stable moderate enlargement of the cardiopericardial silhouette. Atherosclerotic calcification of the aorta. Coronary vascular calcification or stent. No acute osseous pathology. IMPRESSION: 1. Interval removal of the right IJ central venous line. 2. Slight interval improvement of the edema since the prior radiograph. 3. Stable cardiomegaly. Electronically Signed   By: Anner Crete M.D.   On: 02/04/2020 22:46   DG Foot Complete Left  Result Date: 02/04/2020 CLINICAL DATA:  Necrotic toe EXAM: LEFT FOOT - COMPLETE 3+ VIEW COMPARISON:  None. FINDINGS: There is no evidence of fracture or dislocation. No definite area of cortical destruction, however somewhat limited views of the distal phalanges due to  flexion deformity. There is diffuse osteopenia. Dorsal soft tissue swelling. Cataract vascular calcifications are noted. First MTP joint osteoarthritis is noted. IMPRESSION: No definite acute osseous abnormality. Electronically Signed   By: Prudencio Pair M.D.   On: 02/04/2020 23:50    EKG: Independently reviewed.  Rate controlled A. fib.  No significant change since prior tracing.  Assessment/Plan Principal Problem:   Cellulitis Active Problems:   CHF (congestive heart failure), NYHA class IV, acute, systolic (HCC)   Necrosis of toe (HCC)   Critical lower limb ischemia   Severe sepsis (HCC)   Infected necrotic left second toe with signs of cellulitis/severe sepsis in the setting of known severe PAD: Patient is afebrile.  Does have leukocytosis on labs.  Hypotensive with systolic in the 38S.  X-ray of left foot without evidence of osteomyelitis.  Has known history of severe PAD for which patient had decided not to undergo surgery during his recent hospitalization given complicated clinical course/decompensated CHF with cardiogenic shock. -Vascular surgery will consult in a.m.  Continue vancomycin and Zosyn.  Patient received 250 cc fluid bolus in the ED.  Check lactic acid level.  Give IV fluid judiciously given advanced heart failure.  Goal is to keep MAP >65. Continue to monitor WBC count.  Severe PAD/critical lower limb ischemia: DP and PT pulses appreciated on Doppler in the left foot.  However, unable to appreciate pulses on Doppler in the right foot and it is cool to touch. -Vascular surgery will consult in a.m. He is already on  Eliquis for anticoagulation given history of A. Fib.  Will hold Eliquis at this time and start IV heparin.    Chronic combined systolic and diastolic CHF/biventricular failure secondary to ischemic cardiomyopathy: Last echo done January 2021 with EF 25 to 30%.  BNP continues to be significantly elevated at 1863.  However, patient does not appear volume overloaded on  exam and chest x-ray showing improvement of pulmonary edema since prior radiograph.  No hypoxia or signs of respiratory distress. -Hold diuretic at this time given hypotension.  Consult cardiology in a.m. as he will need medical optimization prior to surgery. ADDENDUM: Discussed with Dr. Kalman Shan from cardiology.  It seems patient's blood pressure is chronically low and BNP chronically elevated.  He feels patient is likely at his baseline as far as heart failure is concerned.  Cardiology will consult in a.m.  Chronic hyponatremia: Likely related to advanced heart failure.  Sodium 125 but stable compared to labs done during recent hospitalization. -Hold diuretic at this time given hypotension.  Check serum osmolarity.  Continue to monitor sodium level.  Mild hyperkalemia: Take spironolactone and potassium supplement at home.  Potassium 5.5.  EKG without acute changes. -Hold spironolactone and potassium supplement.  Repeat potassium level in a.m.  AKI on CKD stage III-IV: Etiology could be prerenal from sepsis/hypotension versus cardiorenal from decompensated heart failure.  BUN 36, creatinine 4.1.  Baseline creatinine around 2.0. -Patient received a small fluid bolus for hypotension.  Will avoid giving additional fluid given significantly elevated BNP.  Avoid giving diuretic due to hypotension.  Order renal ultrasound.  Check urine sodium, creatinine.  Avoid nephrotoxic agents/contrast.  Continue to monitor renal function and urine output.  Consult nephrology in a.m.  Permanent atrial fibrillation: Currently rate controlled. -Hold Eliquis and start IV heparin in anticipation of surgery for critical limb ischemia.  Hold beta-blocker given hypotension.  Hypertension -Hold antihypertensives given hypotension  Pharmacy med rec pending.  DVT prophylaxis: Heparin Code Status: Patient wishes to be full code. Family Communication: No family available at this time. Disposition Plan: Status is:  Inpatient  Remains inpatient appropriate because:Ongoing diagnostic testing needed not appropriate for outpatient work up, IV treatments appropriate due to intensity of illness or inability to take PO and Inpatient level of care appropriate due to severity of illness   Dispo: The patient is from: Home              Anticipated d/c is to: SNF              Anticipated d/c date is: > 3 days              Patient currently is not medically stable to d/c.  The medical decision making on this patient was of high complexity and the patient is at high risk for clinical deterioration, therefore this is a level 3 visit.  Shela Leff MD [facility]  If 7PM-7AM, please contact night-coverage [email or web address]  02/05/2020, 4:11 AM

## 2020-02-05 NOTE — ED Notes (Signed)
Pt transported to US

## 2020-02-05 NOTE — Consult Note (Addendum)
Advanced Heart Failure Team Consult Note   Primary Physician: Patient, No Pcp Per PCP-Cardiologist:  Kirk Ruths, MD  [facility]: Dr. Haroldine Laws   Reason for Consultation: low output systolic heart failure   HPI:    Logan White is seen today for evaluation of low output systolic heart failure at the request of Dr. Posey Pronto, Internal Medicine.   79 y/o male with chronic combined systolic/ diastolic CHF/ biventricular failure 2/2 ischemic CM, CAD s/p prior PCI of the LAD and LCx, remote h/o VF arrest x 2 (first in 2013 in the setting of hypokalemia (K of 2.2) and again in 2014 in setting of MI), no ICD, h/o permanent atrial fibrillation on chronic a/c w/ apxiaban, CKD (basline SCr ~ 2.0-2.3) and severe LE PAD w/ knownR external iliac artery occlusion with severe infrainguinal arterial occlusive disease and peroneal runoff only. L SFA occlusion with diffuse infrainguinal arterial occlusive disease, being followed by [facility].   He was scheduled for surgical revascularization w/ Dr. Trula Slade 2 weeks ago, but surgery was canceled as he was felt to be too high risk from a cardiac standpoint. He had been admitted, 01/22/20, prior to planned surgery for heart failure optimization, however on admit  he had a/c systolic heart failure and required IV lasix. He decompensated and developed cardiogenic shock with severely reduced mixed venous saturation (36%) and required inotropes.  Unfortunately due to a/c systolic heart failure + cardiogenic shock he was  felt to be high risk for surgery. He was given the option to continue with surgery but elected to cancel surgery even if it meant he may need amputation down the road. Dr Radene Gunning met and discussed with him at length. Milrinone was stopped after decision was made to cancel surgery. He was discharged home on 5/27 w/ home health services. Of note, he was placed on low dose  blocker for rate control of afib.   He presented to ED overnight w/ worsening toe pain and  generalized weakness. Found to have infected necrotic left second toe w/ signs of cellulitis and sepsis. WBC 16K. AF. Started on Vancomycin + Zosyn.   AHF consulted for management of his chronic systolic  HF. He appears grossly euvolemic on exam and denies dyspnea and no orthopnea. His SCr has worsened however, 4.15 on admit, down to 3.65 today . SCr last week was 2.43.   K on AM lab draw was 5.5. He received a dose of Lokelma.   His main complaint currently is LE pain. Remains undecided regarding reconsidering vascular surgery.     Review of Systems: [y] = yes, '[ ]'  = no   . General: Weight gain '[ ]' ; Weight loss '[ ]' ; Anorexia '[ ]' ; Fatigue '[ ]' ; Fever '[ ]' ; Chills '[ ]' ; Weakness '[ ]'   . Cardiac: Chest pain/pressure '[ ]' ; Resting SOB '[ ]' ; Exertional SOB '[ ]' ; Orthopnea '[ ]' ; Pedal Edema '[ ]' ; Palpitations '[ ]' ; Syncope '[ ]' ; Presyncope '[ ]' ; Paroxysmal nocturnal dyspnea'[ ]'   . Pulmonary: Cough '[ ]' ; Wheezing'[ ]' ; Hemoptysis'[ ]' ; Sputum '[ ]' ; Snoring '[ ]'   . GI: Vomiting'[ ]' ; Dysphagia'[ ]' ; Melena'[ ]' ; Hematochezia '[ ]' ; Heartburn'[ ]' ; Abdominal pain '[ ]' ; Constipation '[ ]' ; Diarrhea '[ ]' ; BRBPR '[ ]'   . GU: Hematuria'[ ]' ; Dysuria '[ ]' ; Nocturia'[ ]'   . Vascular: Pain in legs with walking '[ ]' ; Pain in feet with lying flat '[ ]' ; Non-healing sores '[ ]' ; Stroke '[ ]' ; TIA '[ ]' ; Slurred speech '[ ]' ;  . Neuro: Headaches'[ ]' ;  Vertigo'[ ]' ; Seizures'[ ]' ; Paresthesias'[ ]' ;Blurred vision '[ ]' ; Diplopia '[ ]' ; Vision changes '[ ]'   . Ortho/Skin: Arthritis '[ ]' ; Joint pain '[ ]' ; Muscle pain '[ ]' ; Joint swelling '[ ]' ; Back Pain '[ ]' ; Rash '[ ]'   . Psych: Depression'[ ]' ; Anxiety'[ ]'   . Heme: Bleeding problems '[ ]' ; Clotting disorders '[ ]' ; Anemia '[ ]'   . Endocrine: Diabetes '[ ]' ; Thyroid dysfunction'[ ]'   Home Medications Prior to Admission medications   Medication Sig Start Date End Date Taking? Authorizing Provider  acetaminophen (TYLENOL) 325 MG tablet Take 650 mg by mouth every 6 (six) hours as needed for mild pain.    Yes [provider]  albuterol  (VENTOLIN HFA) 108 (90 Base) MCG/ACT inhaler Inhale 2 puffs into the lungs every 6 (six) hours as needed for wheezing or shortness of breath. 08/26/19  Yes Lelon Perla, MD  atorvastatin (LIPITOR) 80 MG tablet TAKE 1 TABLET BY MOUTH EVERY DAY Patient taking differently: Take 80 mg by mouth daily.  11/26/19  Yes Lelon Perla, MD  Camphor-Menthol [facility] EX) Apply 1 application topically daily as needed (itching).   Yes [provider]  ELIQUIS 5 MG TABS tablet TAKE 1 TABLET BY MOUTH TWICE A DAY Patient taking differently: Take 5 mg by mouth 2 (two) times daily.  01/13/20  Yes Bensimhon, Shaune Pascal, MD  Ensure (ENSURE) Take 237 mLs by mouth daily.    Yes [provider]  ferrous sulfate 325 (65 FE) MG tablet Take 325 mg by mouth 2 (two) times daily. 01/02/20  Yes [provider]  Fluticasone-Salmeterol (ADVAIR DISKUS) 250-50 MCG/DOSE AEPB Inhale 1 puff into the lungs 2 (two) times daily for 14 days. Patient taking differently: Inhale 1 puff into the lungs 2 (two) times daily as needed (shortness of breath).  10/06/19 02/05/20 Yes Hall, Carole N, DO  gabapentin (NEURONTIN) 300 MG capsule Take 300 mg by mouth at bedtime.  01/16/20  Yes [provider]  hydrALAZINE (APRESOLINE) 10 MG tablet TAKE 1 TABLET BY MOUTH TWICE A DAY Patient taking differently: Take 10 mg by mouth 2 (two) times daily.  12/16/19  Yes Lelon Perla, MD  hydrOXYzine (VISTARIL) 25 MG capsule Take 25 mg by mouth every 8 (eight) hours as needed for itching.  01/16/20  Yes [provider]  isosorbide mononitrate (IMDUR) 30 MG 24 hr tablet Take 1 tablet (30 mg total) by mouth daily. 11/08/19  Yes Domenic Polite, MD  KLOR-CON M20 20 MEQ tablet TAKE 1 TABLET BY MOUTH TWICE A DAY Patient taking differently: Take 20 mEq by mouth 2 (two) times daily.  01/08/20  Yes Fulp, Cammie, MD  metoprolol succinate (TOPROL-XL) 25 MG 24 hr tablet Take 0.5 tablets (12.5 mg total) by mouth at bedtime. Take with or  immediately following a meal. 12/26/19 03/25/20 Yes Clegg, Amy D, NP  nitroGLYCERIN (NITROSTAT) 0.4 MG SL tablet Place 1 tablet (0.4 mg total) under the tongue every 5 (five) minutes as needed for chest pain (Up to 3 doses). 12/26/19  Yes Clegg, Amy D, NP  oxymetazoline (AFRIN) 0.05 % nasal spray Place 1 spray into both nostrils 2 (two) times daily as needed for congestion.   Yes [provider]  pantoprazole (PROTONIX) 40 MG tablet Take 1 tablet (40 mg total) by mouth 2 (two) times daily. 01/02/20  Yes Argentina Donovan, PA-C  spironolactone (ALDACTONE) 25 MG tablet Take 1 tablet (25 mg total) by mouth daily. 01/02/20  Yes Argentina Donovan, PA-C  torsemide (DEMADEX) 100 MG tablet Take 1 tablet (100 mg total) by mouth 2 (two) times daily. 12/08/19 06/05/20 Yes Clegg, Amy D, NP    Past Medical History: Past Medical History:  Diagnosis Date  . AAA (abdominal aortic aneurysm) ([place])    a. 08/2015 Abd U/S: 2.7 x 2.9 x 3.2 cm infrarenal AAA.  . Bradycardia    a. Requiring discontinuation of use of BB/CCB  . Cardiac arrest - ventricular fibrillation    a. 03/2012 in setting of hypokalemia (prolonged hosp with VDRF, tracheobronchitis, ARF, shock liver, PAF, AMS felt secondary to post-anoxic encephalopathy/shock).  . Carotid artery stenosis    a. 01/2011 - 40-59% bilateral stenosis;  b. 06/2015 Carotid U/S: 40-59% bilat ICA stenosis->f/u 6 mos.  . CHF (congestive heart failure) ([place])   . CKD (chronic kidney disease), stage III   . Coronary artery disease    a. s/p aborted ant STEMI tx with Cypher DES to LAD 10/04 (residual at cath: D1 50%, CFX 40% and multiple dist 70%, EF 55%);   b. myoview 3/10: Ef 47%, infero-apical isch, LOW RISK - med Tx recommended;  c. 12/2012 VF Arrest/Cath: LAD 40isr, 80 apical, LCX 100 (failed PTCA), RCA nondom.  . Diabetes mellitus   . Diverticulosis 2001  . Elevated LFTs    Shock liver 03/2012  . H/O: CVA (cardiovascular accident)   . Hematemesis    a. 12/2010 felt 2/2  Mallory Weiss tear - pt could not afford colonscopy/EGD at that time so Coumadin was deferred. Coumadin initiated 03/2012 without any evidence for bleeding.  Marland Kitchen History of Ischemic Cardiomyopathy    a. EF 50-55% by echo 03/09/12 (was 30% by echo 02/24/12); b. 03/2013 Echo: EF 55-60%, mild MR, sev dil LA, mod dil RA, PASP 53mHg.  .Marland KitchenHypertensive heart disease    a. echo 4/12: EF 50%, asymmetric septal hypertrophy, no SAM or LVOT gradient, LAE, PASP 35  . Inguinal hernia   . 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 Iron deficiency anemia   . Mitral regurgitation    a. Mild by echo 03/2012 and 03/2013.  . Mixed Hyperlipidemia   . Peripheral vascular disease ([place]    a. h/o LE angioplasty;  b. 08/2015 Duplex: >50% RCIA, 100% REIA, >50% LEIA, elev vel in SMA, patent IVC.  .Marland KitchenPersistent atrial fibrillation ([place]    a. Dx 12/2010-->coumadin (CHA2DS2VASc = 7).  . QT prolongation   . Tubular adenoma of colon 11/2019    Past Surgical History: Past Surgical History:  Procedure Laterality Date  . ABDOMINAL AORTOGRAM N/A 11/19/2019   Procedure: ABDOMINAL AORTOGRAM;  Surgeon: BSerafina Mitchell MD;  [facility];  Service: Cardiovascular;  Laterality: N/A;  . BIOPSY  11/07/2019   Procedure: BIOPSY;  Surgeon: SLadene Artist MD;  [facility];  Service: Endoscopy;;  . CENTRAL LINE INSERTION  11/28/2019   Procedure: CENTRAL LINE INSERTION;  Surgeon: BJolaine Artist MD;  [place];  Service: Cardiovascular;;  . COLONOSCOPY WITH PROPOFOL N/A 11/07/2019   Procedure: COLONOSCOPY WITH PROPOFOL;  Surgeon: SLadene Artist MD;  [facility];  Service: Endoscopy;  Laterality: N/A;  . ESOPHAGOGASTRODUODENOSCOPY (EGD) WITH PROPOFOL N/A 11/07/2019   Procedure: ESOPHAGOGASTRODUODENOSCOPY (EGD) WITH PROPOFOL;  Surgeon: SLadene Artist MD;  [facility];  Service: Endoscopy;  Laterality: N/A;  . HEMOSTASIS CLIP PLACEMENT   11/07/2019   Procedure: HEMOSTASIS CLIP PLACEMENT;  Surgeon: SLadene Artist MD;  [place]  Service:  Endoscopy;;  . HERNIA REPAIR    . LEFT HEART CATH N/A 12/30/2012   Procedure: LEFT HEART CATH;  Surgeon: [name], MD;  Location: [facility] CATH LAB;  Service: Cardiovascular;  Laterality: N/A;  . LOWER EXTREMITY ANGIOGRAPHY Bilateral 11/19/2019   Procedure: Lower Extremity Angiography;  Surgeon: Serafina Mitchell, MD;  Location: [place] CV LAB;  Service: Cardiovascular;  Laterality: Bilateral;  . ORIF TIBIA & FIBULA FRACTURES  01/11/07   OPEN TX OF UNICONDYLAR PLATEAU FRACTURE, IRRIGATION/DEBRIDEMENT OF OPEN FRACTURE INCLUDING BONE, REMOVAL OF EXTERNAL FIXATOR UNDER ANESTHESIA PER DR. MICHAEL HANDY  . PERIPHERAL VASCULAR CATHETERIZATION N/A 12/21/2015   Procedure: Lower Extremity Angiography;  Surgeon: [name], MD;  Location: [place] CV LAB;  Service: Cardiovascular;  Laterality: N/A;  . POLYPECTOMY  11/07/2019   Procedure: POLYPECTOMY;  Surgeon: [name], MD;  Location: [place];  Service: Endoscopy;;  . POPLITEAL ARTERY ANGIOPLASTY  01/07/07   s/p LEFT POPLITEAL ARTERY EXPLORATION AND VEIN PATCH ANGIOPLASTY, PER DR. EARLY, SECONDARY TO ISCHEMIC LEFT FOOT RELATED TO LEFT POPLITEAL ARTERY INJURY  . RIGHT HEART CATH N/A 10/02/2019   Procedure: RIGHT HEART CATH;  Surgeon: [name], MD;  Location: [place] CV LAB;  Service: Cardiovascular;  Laterality: N/A;  . RIGHT HEART CATH N/A 11/28/2019   Procedure: RIGHT HEART CATH;  Surgeon: Jolaine Artist, MD;  Location: [place] CV LAB;  Service: Cardiovascular;  Laterality: N/A;  . RIGHT HEART CATH N/A 01/24/2020   Procedure: RIGHT HEART CATH;  Surgeon: Jolaine Artist, MD;  Location: [place] CV LAB;  Service: Cardiovascular;  Laterality: N/A;  . SKIN GRAFT    . TEE WITHOUT CARDIOVERSION N/A 08/08/2019   Procedure: TRANSESOPHAGEAL ECHOCARDIOGRAM (TEE);  Surgeon: Lelon Perla, MD;  Location[facility]  END[facility];  Service: Cardiovascular;  Laterality: N/A;    Family History: Family History  Problem Relation Age of Onset  . Heart disease Father        ALSO UNCLE DIED HAD CAD  . Heart failure Father   . Brain cancer Mother   . Leukemia Mother   . Sickle cell anemia Mother   . Sickle cell anemia Other     Social History: Social History   Socioeconomic History  . Marital status: Married    Spouse name: Not on file  . Number of children: 4  . Years of education: Not on file  . Highest education level: Not on file  Occupational History  . 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 Smoking status: Former Smoker    Packs/day: 0.50    Years: 30.00    Pack years: 15.00    Types: Cigarettes    Quit date: 03/12/2012    Years since quitting: 7.9  . Smokeless tobacco: Never Used  Substance and Sexual Activity  . Alcohol use: No    Alcohol/week: 0.0 standard drinks  . Drug use: No    Frequency: 2.0 times per week  . Sexual activity: Not on file  Other Topics Concern  . 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 YES. 1/2 -1 PPD OF CIGARETTES    NO ETOH   Social Determinants of Health   Financial Resource Strain:   . Difficulty of Paying Living Expenses:   Food Insecurity: No Food  Insecurity  . Worried About Charity fundraiser in the Last Year: Never true  . Ran Out of Food in the Last Year: Never true  Transportation Needs: No Transportation Needs  . Lack of Transportation (Medical): No  . Lack of Transportation (Non-Medical): No  Physical Activity:   . Days of Exercise per Week:   . Minutes of Exercise per Session:   Stress:   . Feeling of Stress :   Social Connections:   . Frequency of Communication with Friends and Family:   . Frequency of Social Gatherings with Friends and Family:   . Attends Religious Services:   .  Active Member of Clubs or Organizations:   . Attends Archivist Meetings:   Marland Kitchen Marital Status:     Allergies:  No Known Allergies  Objective:    Vital Signs:   Temp:  [97.7 F (36.5 C)-98.5 F (36.9 C)] 98 F (36.7 C) (06/02 1111) Pulse Rate:  [51-91] 72 (06/02 1111) Resp:  [12-27] 17 (06/02 1111) BP: (85-115)/(41-86) 97/69 (06/02 1111) SpO2:  [93 %-100 %] 95 % (06/02 1111) Weight:  [76 kg] 76 kg (06/02 0521)    Weight change: Filed Weights   02/05/20 0521  Weight: 76 kg    Intake/Output:   Intake/Output Summary (Last 24 hours) at 02/05/2020 1322 Last data filed at 02/05/2020 0521 Gross per 24 hour  Intake 200 ml  Output --  Net 200 ml      Physical Exam    General:  Chronically ill appearing AAM, thin. No resp difficulty HEENT: normal Neck: supple. No JVP. Carotids 2+ bilat; no bruits. No lymphadenopathy or thyromegaly appreciated. Cor: PMI nondisplaced. Irregular rhythm, regular rate. No rubs, gallops or murmurs. Lungs: clear Abdomen: soft, nontender, nondistended. No hepatosplenomegaly. No bruits or masses. Good bowel sounds. Extremities: no cyanosis, clubbing, rash, no edema, left foot bandaged  Neuro: alert & orientedx3, cranial nerves grossly intact. moves all 4 extremities w/o difficulty. Affect pleasant   Telemetry   Atrial fibrillation w/ CVR   EKG    Afib 71 bpm   Labs   Basic Metabolic Panel: Recent Labs  Lab 01/30/20 0829 02/04/20 1809 02/05/20 0735  NA 125* 125* 126*  K 3.7 5.5* 5.5*  CL 91* 93* 94*  CO2 22 22 19*  GLUCOSE 121* 125* 90  BUN 35* 56* 59*  CREATININE 2.43* 4.15* 3.65*  CALCIUM 8.8* 8.5* 8.6*  PHOS  --   --  3.4    Liver Function Tests: Recent Labs  Lab 02/05/20 0735  ALBUMIN 2.2*   No results for input(s): LIPASE, AMYLASE in the last 168 hours. No results for input(s): AMMONIA in the last 168 hours.  CBC: Recent Labs  Lab 01/30/20 0500 02/04/20 1809 02/05/20 0433  WBC 10.1 15.5* 13.5*  HGB  8.7* 10.1* 9.7*  HCT 25.4* 30.3* 28.5*  MCV 72.0* 75.8* 74.2*  PLT 225 475* 369    Cardiac Enzymes: No results for input(s): CKTOTAL, CKMB, CKMBINDEX, TROPONINI in the last 168 hours.  BNP: BNP (last 3 results) Recent Labs    12/26/19 1515 01/22/20 1850 02/04/20 1845  BNP 1,843.7* 2,557.6* 1,863.4*    ProBNP (last 3 results) Recent Labs    08/15/19 0949  PROBNP 16,511*     CBG: No results for input(s): GLUCAP in the last 168 hours.  Coagulation Studies: No results for input(s): LABPROT, INR in the last 72 hours.   Imaging   DG Chest 2 View  Result Date: 02/04/2020 CLINICAL DATA:  79 year old male with shortness of breath. EXAM: CHEST - 2 VIEW COMPARISON:  Chest radiograph dated 01/27/2020. FINDINGS: Interval removal of the right IJ central venous line. There is mild diffuse interstitial edema. No consolidative changes. There is no pleural effusion pneumothorax. Stable moderate enlargement of the cardiopericardial silhouette. Atherosclerotic calcification of the aorta. Coronary vascular calcification or stent. No acute osseous pathology. IMPRESSION: 1. Interval removal of the right IJ central venous line. 2. Slight interval improvement of the edema since the prior radiograph. 3. Stable cardiomegaly. Electronically Signed   By: Anner Crete M.D.   On: 02/04/2020 22:46   US RENAL  Result Date: 02/05/2020 CLINICAL DATA:  Acute kidney injury EXAM: RENAL / URINARY TRACT ULTRASOUND COMPLETE COMPARISON:  None. FINDINGS: Right Kidney: Renal measurements: 9.5 x 3.9 x 2.7 cm = volume: 52 mL. Increased echogenicity seen throughout. No mass or hydronephrosis visualized. Left Kidney: Renal measurements: 10.0 x 4.9 x 3.4 = volume: 86 mL. Increased echogenicity seen throughout. No mass or hydronephrosis visualized. Bladder: Appears normal for degree of bladder distention. Other: There is a small amount of abdominopelvic ascites seen. IMPRESSION: Diffusely increased parenchymal  echogenicity, consistent with medical renal disease. Small amount of abdominopelvic ascites. Electronically Signed   By: Prudencio Pair M.D.   On: 02/05/2020 04:47   DG Foot Complete Left  Result Date: 02/04/2020 CLINICAL DATA:  Necrotic toe EXAM: LEFT FOOT - COMPLETE 3+ VIEW COMPARISON:  None. FINDINGS: There is no evidence of fracture or dislocation. No definite area of cortical destruction, however somewhat limited views of the distal phalanges due to flexion deformity. There is diffuse osteopenia. Dorsal soft tissue swelling. Cataract vascular calcifications are noted. First MTP joint osteoarthritis is noted. IMPRESSION: No definite acute osseous abnormality. Electronically Signed   By: Prudencio Pair M.D.   On: 02/04/2020 23:50      Medications:     Current Medications: . ferrous sulfate  325 mg Oral BID  . gabapentin  100 mg Oral QHS  . mometasone-formoterol  2 puff Inhalation BID  . pantoprazole  40 mg Oral BID  . sodium chloride flush  3 mL Intravenous Q12H  . sodium zirconium cyclosilicate  10 g Oral Once  . torsemide  100 mg Oral Daily     Infusions: . sodium chloride    . heparin 1,300 Units/hr (02/05/20 0541)  . piperacillin-tazobactam (ZOSYN)  IV 2.25 g (02/05/20 0546)       Assessment/Plan   1. Infected Necrotic Left 2nd Great Toe w/ Cellulitis - in the setting of severe PAD  - WBC 16>>13K. AF - continue Zosyn   2. Chronic Biventricular CHF ->cardiogenic shock - known ICM dating back to 2013.  - Echo 1/21showed LVEF 25-30%, global hypokinesis, mild LVH, RV mildly enlarged w/ severely reduced systolic function. Moderate MR. Mild TR. Severe bi atrial enlargement. - RHC on 3/25 showed elevated biventricular filling pressures and with equalization of R &L sided diastolic pressuresand preserved CO consistent withrestrictive physiology. - Developed cardiogenic shock last admission 5/20 with initial co-ox 36%. Hemodynamics markedly improved on milrinone 0.25 but  he was weaned off  - Now readmitted primarily for infected left great toe w/ cellulitis but showing signs of low output, w/ worsening renal function w/ SCr up to  4.15 (2.65 on 5/27).  - He previously declined vascular surgery as he was felt too high risk but now reconsidering.  - for now, we will restart empiric milrinone 0.25 mcg to try to optimize, in the event that he reconsiders.  -  Will place palliative care consult to discuss goals of care. If pt decides to transition to full palliative care, we can later stop milrinone.  - He appears stable from a volume standpoint. Continue PO torsemide  - No ARB/ARNI with CKD IV   3. PAD w/ Claudication  - knownR external iliac artery occlusion with severe infrainguinal arterial occlusive disease and peroneal runoff only. L SFA occlusion with diffuse infrainguinal arterial occlusive disease -Dr. Trula Slade planned for  left iliac stent +/- bilateral femoral endarterectomies, and a left to right femorofemoral bypass -He was optimized for surgery but after a detailed discussion of the risks he refused. - now possibly reconsidering, in the setting of infected great left toe. Will ask palliative care team to visit to discuss goals of care. If he decides to go w/ surgery,  will try to optimize from a heart failure standpoint. Starting milrinone as outlined above.   4.CKD IV:  - Creatinine baseline ~2.5 - Creatinine up to 4.15 on admit>>3.65 - suspect cardiorenal syndrome - start milrinone 0.25 mcg/kg/min - follow BMP daily  - treat hyperkalemia w/ PRN Loklema   5CAD:  - prior LAD and LCx stenting - nos/s angina - no ASA due to apixaban - continue high dose statin   6.Chronic AF - currently rate controlled.  - monitor closely w/ milrinone - can use amiodarone gtt if needed  -Off b-blocker due to shock. - on Eliquis as outpatient . Now on hold  - continue IV heparin for now   7. Hyponatremia - Na 126 today. Likely due to HF. -  mentating ok - consider tolvaptan if level decreases   8. Hyperkalemia - K 5.5, in the setting of worsening renal failure  - Received Lokelma - Continue to follow closely   9. Anemia , chronic disease - required feraheme last admission - Hgb 9.7 today. Monitor    Length of Stay: 0  Lyda Jester, PA-C  02/05/2020, 1:22 PM  Advanced Heart Failure Team Pager [phone] (M-F; [place])  Please contact [facility] for night-coverage after hours (4p -7a ) and weekends on [email or web address]  Patient seen with PA, agree with the above note.   Patient was readmitted with pain from infected, necrotic toe on left foot.  Last admission, he was on milrinone for low output HF but declined vascular surgery to treat gangrene/infection left foot.  Milrinone was stopped and he was discharged.   Creatinine was up to 4.15 at admission from around 2.5.  Down to 3.65 today.   General: NAD Neck: No JVD, no thyromegaly or thyroid nodule.  Lungs: Clear to auscultation bilaterally with normal respiratory effort. CV: Lateral PMI.  Heart regular S1/S2, no S3/S4, no murmur.  No peripheral edema. Unable to palpate pedal pulses.  Abdomen: Soft, nontender, no hepatosplenomegaly, no distention.  Skin: Intact without lesions or rashes.  Neurologic: Slow cognition  Psych: Normal affect. Extremities: Left foot wrapped.  HEENT: Normal.   Suspect recurrent low output with cardiorenal syndrome.  Creatinine up to 4.15, now 3.65.  Slowed cognition.  He does not appear volume overloaded on exam.  - I will restart milrinone 0.25 mcg/kg/min for now until treatment plan is firmed up.   - He does not need aggressive diuresis, can continue home milrinone for now.   Chronic atrial fibrillation, will continue on IV heparin gtt for now.  On Eliquis at home.  Rate is controlled.   Gangrenous/infected toe on left foot.  Vascular surgery (fem-fem bypass, etc) would be quite  high risk and patient does not want to do this.   Amputation would be a potential option that he is considering. If he opts to do neither vascular surgery nor amputation, I think that our best course here will be hospice/palliative care.   Given overall picture, with worsening renal function and end-stage CHF, I think that hospice/palliative care would certainly be a reasonable choice.  He is going to talk to our palliative care service today.  If he opts for hospice, will stop milrinone.   Loralie Champagne 02/05/2020 4:06 PM

## 2020-02-05 NOTE — Progress Notes (Signed)
ANTICOAGULATION CONSULT NOTE - Followup  Pharmacy Consult for Heparin (Apixaban on hold) Indication: atrial fibrillation  No Known Allergies  Patient Measurements: Heparin Dosing Weight: 73kg  Vital Signs: Temp: 98 F (36.7 C) (06/02 1111) Temp Source: Oral (06/02 1111) BP: 116/64 (06/02 1845) Pulse Rate: 70 (06/02 1815)  Labs: Recent Labs    02/04/20 1809 02/05/20 0059 02/05/20 0433 02/05/20 0735 02/05/20 1703  HGB 10.1*  --  9.7*  --   --   HCT 30.3*  --  28.5*  --   --   PLT 475*  --  369  --   --   APTT  --   --   --   --  130*  HEPARINUNFRC  --   --   --   --  >2.20*  CREATININE 4.15*  --   --  3.65*  --   TROPONINIHS  --  13  --   --   --     Estimated Creatinine Clearance: 18.5 mL/min (A) (by C-G formula based on SCr of 3.65 mg/dL (H)).   Medical History: Past Medical History:  Diagnosis Date   AAA (abdominal aortic aneurysm) ([place])    a. 08/2015 Abd U/S: 2.7 x 2.9 x 3.2 cm infrarenal AAA.   Bradycardia    a. Requiring discontinuation of use of BB/CCB   Cardiac arrest - ventricular fibrillation    a. 03/2012 in setting of hypokalemia (prolonged hosp with VDRF, tracheobronchitis, ARF, shock liver, PAF, AMS felt secondary to post-anoxic encephalopathy/shock).   Carotid artery stenosis    a. 01/2011 - 40-59% bilateral stenosis;  b. 06/2015 Carotid U/S: 40-59% bilat ICA stenosis->f/u 6 mos.   CHF (congestive heart failure) (HCC)    CKD (chronic kidney disease), stage III    Coronary artery disease    a. s/p aborted ant STEMI tx with Cypher DES to LAD 10/04 (residual at cath: D1 50%, CFX 40% and multiple dist 70%, EF 55%);   b. myoview 3/10: Ef 47%, infero-apical isch, LOW RISK - med Tx recommended;  c. 12/2012 VF Arrest/Cath: LAD 40isr, 80 apical, LCX 100 (failed PTCA), RCA nondom.   Diabetes mellitus    Diverticulosis 2001   Elevated LFTs    Shock liver 03/2012   H/O: CVA (cardiovascular accident)    Hematemesis    a. 12/2010 felt 2/2 Mallory Weiss  tear - pt could not afford colonscopy/EGD at that time so Coumadin was deferred. Coumadin initiated 03/2012 without any evidence for bleeding.   History of Ischemic Cardiomyopathy    a. EF 50-55% by echo 03/09/12 (was 30% by echo 02/24/12); b. 03/2013 Echo: EF 55-60%, mild MR, sev dil LA, mod dil RA, PASP 6mmHg.   Hypertensive heart disease    a. 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 Mild by echo 03/2012 and 03/2013.   Mixed Hyperlipidemia    Peripheral vascular disease ([place])    a. h/o LE angioplasty;  b. 08/2015 Duplex: >50% RCIA, 100% REIA, >50% LEIA, elev vel in SMA, patent IVC.   Persistent atrial fibrillation ([place])    a. Dx 12/2010-->coumadin (CHA2DS2VASc = 7).   QT prolongation    Tubular adenoma of colon 11/2019   Assessment: 79 y/o M on apixaban PTA for afib. Holding apixaban and was started on heparin in anticipation  of surgery.  No baseline heparin level or aPTT obtained.  Initial aPTT prolonged at 130, HL >2.20.  Hgb 9.7, Plts 369 earlier today.  Noted renal dysfunction. Using aPTT to dose for now. Per RN, Pamala Hurry, unsure if drawn from opposite arm or not as multiple bandages. No bleeding noted. No issues with infusion.  Will hold x1 hr and reduce.   Goal of Therapy:  Heparin level 0.3-0.7 units/ml aPTT 66-102 seconds Monitor platelets by anticoagulation protocol: Yes   Plan:  Hold Heparin x1 hour then reduce rate to 1050 units/hr.  Recheck  APTT/HL in 8 hours after restart (with AM labs) Daily CBC/HL/aPTT Monitor for bleeding  Sloan Leiter, PharmD, BCPS, BCCCP Clinical Pharmacist Please refer to [facility] for [place] numbers 02/05/2020, 7:12 PM

## 2020-02-06 ENCOUNTER — Other Ambulatory Visit: Payer: Self-pay | Admitting: Family Medicine

## 2020-02-06 DIAGNOSIS — I70235 Atherosclerosis of native arteries of right leg with ulceration of other part of foot: Secondary | ICD-10-CM

## 2020-02-06 DIAGNOSIS — Z7189 Other specified counseling: Secondary | ICD-10-CM

## 2020-02-06 DIAGNOSIS — Z515 Encounter for palliative care: Secondary | ICD-10-CM

## 2020-02-06 DIAGNOSIS — I5021 Acute systolic (congestive) heart failure: Secondary | ICD-10-CM

## 2020-02-06 LAB — BASIC METABOLIC PANEL
Anion gap: 10 (ref 5–15)
BUN: 57 mg/dL — ABNORMAL HIGH (ref 8–23)
CO2: 24 mmol/L (ref 22–32)
Calcium: 8.6 mg/dL — ABNORMAL LOW (ref 8.9–10.3)
Chloride: 94 mmol/L — ABNORMAL LOW (ref 98–111)
Creatinine, Ser: 3.17 mg/dL — ABNORMAL HIGH (ref 0.61–1.24)
GFR calc Af Amer: 21 mL/min — ABNORMAL LOW (ref >60)
GFR calc non Af Amer: 18 mL/min — ABNORMAL LOW (ref >60)
Glucose, Bld: 125 mg/dL — ABNORMAL HIGH (ref 70–99)
Potassium: 4.6 mmol/L (ref 3.5–5.1)
Sodium: 128 mmol/L — ABNORMAL LOW (ref 135–145)

## 2020-02-06 LAB — HEPARIN LEVEL (UNFRACTIONATED): Heparin Unfractionated: >2.2 IU/mL — ABNORMAL HIGH (ref 0.30–0.70)

## 2020-02-06 LAB — CBC
HCT: 27.6 % — ABNORMAL LOW (ref 39.0–52.0)
Hemoglobin: 9.6 g/dL — ABNORMAL LOW (ref 13.0–17.0)
MCH: 25.3 pg — ABNORMAL LOW (ref 26.0–34.0)
MCHC: 34.8 g/dL (ref 30.0–36.0)
MCV: 72.8 fL — ABNORMAL LOW (ref 80.0–100.0)
Platelets: 430 10*3/uL — ABNORMAL HIGH (ref 150–400)
RBC: 3.79 MIL/uL — ABNORMAL LOW (ref 4.22–5.81)
RDW: 18.8 % — ABNORMAL HIGH (ref 11.5–15.5)
WBC: 11.1 10*3/uL — ABNORMAL HIGH (ref 4.0–10.5)
nRBC: 0.4 % — ABNORMAL HIGH (ref 0.0–0.2)

## 2020-02-06 LAB — APTT
aPTT: 67 seconds — ABNORMAL HIGH (ref 24–36)
aPTT: 74 seconds — ABNORMAL HIGH (ref 24–36)

## 2020-02-06 NOTE — Progress Notes (Signed)
Palliative made aware about the needs to see him today before deciding whether to go for surgery or not,claimed that someone will be coming to see him today.

## 2020-02-06 NOTE — Progress Notes (Addendum)
Advanced Heart Failure Rounding Note  PCP-Cardiologist: Kirk Ruths, MD   Subjective:    Started on milrinone 0.25 mcg for suspected low output heart failure. .   Creatinine trending down.   Feels ok today. Denies SOB.   Objective:   Weight Range: 74.6 kg Body mass index is 20.56 kg/m.   Vital Signs:   Temp:  [98 F (36.7 C)-98.5 F (36.9 C)] 98 F (36.7 C) (06/03 0400) Pulse Rate:  [70-93] 90 (06/03 0400) Resp:  [11-24] 20 (06/03 0400) BP: (83-119)/(43-77) 112/63 (06/02 2327) SpO2:  [95 %-100 %] 100 % (06/02 2327) Weight:  [74.6 kg] 74.6 kg (06/03 0500)    Weight change: Filed Weights   02/05/20 0521 02/06/20 0500  Weight: 76 kg 74.6 kg    Intake/Output:   Intake/Output Summary (Last 24 hours) at 02/06/2020 0722 Last data filed at 02/06/2020 0500 Gross per 24 hour  Intake 480 ml  Output --  Net 480 ml      Physical Exam    General: . No resp difficulty HEENT: Normal Neck: Supple. JVP 6-7  . Carotids 2+ bilat; no bruits. No lymphadenopathy or thyromegaly appreciated. Cor: PMI nondisplaced. Irregular rate & rhythm. No rubs, gallops or murmurs. Lungs: Clear no wheeze on room air Abdomen: Soft, nontender, nondistended. No hepatosplenomegaly. No bruits or masses. Good bowel sounds. Extremities: No cyanosis, clubbing, rash, edema. LLE dressing.  Neuro: Alert & orientedx3, cranial nerves grossly intact. moves all 3 extremities w/o difficulty. Affect pleasant   Telemetry   A fib 80-100s   EKG    N/A  Labs    CBC Recent Labs    02/05/20 0433 02/06/20 0208  WBC 13.5* 11.1*  HGB 9.7* 9.6*  HCT 28.5* 27.6*  MCV 74.2* 72.8*  PLT 369 267*   Basic Metabolic Panel Recent Labs    02/05/20 0735 02/06/20 0208  NA 126* 128*  K 5.5* 4.6  CL 94* 94*  CO2 19* 24  GLUCOSE 90 125*  BUN 59* 57*  CREATININE 3.65* 3.17*  CALCIUM 8.6* 8.6*  PHOS 3.4  --    Liver Function Tests Recent Labs    02/05/20 0735  ALBUMIN 2.2*   No results for  input(s): LIPASE, AMYLASE in the last 72 hours. Cardiac Enzymes No results for input(s): CKTOTAL, CKMB, CKMBINDEX, TROPONINI in the last 72 hours.  BNP: BNP (last 3 results) Recent Labs    12/26/19 1515 01/22/20 1850 02/04/20 1845  BNP 1,843.7* 2,557.6* 1,863.4*    ProBNP (last 3 results) Recent Labs    08/15/19 0949  PROBNP 16,511*     D-Dimer No results for input(s): DDIMER in the last 72 hours. Hemoglobin A1C No results for input(s): HGBA1C in the last 72 hours. Fasting Lipid Panel No results for input(s): CHOL, HDL, LDLCALC, TRIG, CHOLHDL, LDLDIRECT in the last 72 hours. Thyroid Function Tests No results for input(s): TSH, T4TOTAL, T3FREE, THYROIDAB in the last 72 hours.  Invalid input(s): FREET3  Other results:   Imaging     No results found.   Medications:     Scheduled Medications: . ferrous sulfate  325 mg Oral BID  . gabapentin  100 mg Oral QHS  . mometasone-formoterol  2 puff Inhalation BID  . pantoprazole  40 mg Oral BID  . sodium chloride flush  3 mL Intravenous Q12H  . sodium zirconium cyclosilicate  10 g Oral Once  . torsemide  100 mg Oral Daily     Infusions: . sodium chloride    .  heparin 1,050 Units/hr (02/06/20 0312)  . milrinone 0.25 mcg/kg/min (02/05/20 1622)  . piperacillin-tazobactam (ZOSYN)  IV 2.25 g (02/06/20 0635)     PRN Medications:  sodium chloride, acetaminophen, fentaNYL (SUBLIMAZE) injection, HYDROcodone-acetaminophen, sodium chloride flush    Patient Profile  79 y/o male with chronic combined systolic/ diastolic CHF/ biventricular failure 2/2 ischemic CM, CAD s/p prior PCI of the LAD and LCx, remote h/o VF arrest x 2 (first in 2013 in the setting of hypokalemia (K of 2.2) and again in 2014 in setting of MI), no ICD, h/o permanent atrial fibrillation on chronic a/c w/ apxiaban, CKD (basline SCr ~ 2.0-2.3) and severe LE PAD w/ knownR external iliac artery occlusion with severe infrainguinal arterial occlusive  disease and peroneal runoff only. L SFA occlusion with diffuse infrainguinal arterial occlusive disease, being followed by [facility]VS.   Admitted with cellulitis, AKI, and recurrent low output.   Assessment/Plan   1. Infected Necrotic Left 2nd Great Toe w/ Cellulitis - in the setting of severe PAD  - WBC 16>>13K>11. Remains afebrile.  - continue Zosyn   2. Chronic Biventricular CHF ->cardiogenic shock - known ICM dating back to 2013.  - Echo 1/21showed LVEF 25-30%, global hypokinesis, mild LVH, RV mildly enlarged w/ severely reduced systolic function. Moderate MR. Mild TR. Severe bi atrial enlargement. - RHC on 3/25 showed elevated biventricular filling pressures and with equalization of R &L sided diastolic pressuresand preserved CO consistent withrestrictive physiology. - Developed cardiogenic shock last admission 5/20 with initial co-ox 36%. Hemodynamics markedly improved on milrinone 0.25 but he was weaned off - Readmitted primarily for infected left great toe w/ cellulitis but showing signs of low output, w/ worsening renal function w/ SCr up to 4.15 (2.65 on 5/27).  - He previously declined vascular surgery as he was felt too high risk but now reconsidering.  - Continue empiric milrinone 0.25 mcg to try to optimize, in the event that he reconsiders.  -- No ARB/ARNI with CKD IV - Volume status stable. Continue torsemide.   3. PAD w/ Claudication  - knownR external iliac artery occlusion with severe infrainguinal arterial occlusive disease and peroneal runoff only. L SFA occlusion with diffuse infrainguinal arterial occlusive disease -Dr. Trula Slade planned forleft iliac stent +/- bilateral femoral endarterectomies, and a left to right femorofemoral bypass -He was optimized for surgery but after a detailed discussion of the risks he refused. - now possibly reconsidering, in the setting of infected great left toe.  - Palliative Care consulted.   4.CKD IV:  - Creatinine  baseline ~2.5 - Creatinine up to 4.15 on admit>>3.65>>3.2  - suspect cardiorenal syndrome - Improved with milrinone 0.25 mcg/kg/min  5CAD:  - prior LAD and LCx stenting - nos/s angina - no ASA due to apixaban - continue high dose statin   6.Chronic AF - Rate controlled.  - monitor closely w/ milrinone - can use amiodarone gtt if needed  -Off b-blocker due to shock. - on Eliquis as outpatient . Now on hold  - continue IV heparin for now until plan further definced.   7. Hyponatremia - Na 128 today. Likely due to HF. - mentating ok - Restrict free water.    8. Hyperkalemia - K 5.5 on admit, in the setting of worsening renal failure  - Received Lokelma - Potassium down to 4.6 today.    9. Anemia, chronic disease - required feraheme last admission - Hgb 9.6. Stable.    10. Goals Of Care Palliative Care consulted.   Length of Stay:  [place], NP  02/06/2020, 7:22 AM  Advanced Heart Failure Team Pager [phone] (M-F; [place])  Please contact [facility] for night-coverage after hours (4p -7a ) and weekends on [email or web address]  Vascular and palliative care have not seen today.  He is not sure about surgery, wants to talk with family, vascular, and palliative care.    We started him on milrinone gtt yesterday to optimize in case it was decided to push forwards with amputation.  He feels the same today, no dyspnea at rest.  Creatinine trending down.  Reasonble UOP.   General: NAD Neck: No JVD, no thyromegaly or thyroid nodule.  Lungs: Clear to auscultation bilaterally with normal respiratory effort. CV: Lateral PMI.  Heart irregular S1/S2, no S3/S4, no murmur.  No peripheral edema.   Abdomen: Soft, nontender, no hepatosplenomegaly, no distention.  Skin: Intact without lesions or rashes.  Neurologic: Alert and oriented x 3.  Psych: Normal affect. Extremities: Left foot wrapped.  HEENT: Normal.   Suspect recurrent low output with cardiorenal syndrome.   Creatinine up to 4.15, now 3.17 after starting back on milrinone 0.25.  He does not appear volume overloaded on exam.  - I will continue milrinone 0.25 mcg/kg/min for now until treatment plan is firmed up.   - He does not need aggressive diuresis.   Chronic atrial fibrillation, will continue on IV heparin gtt for now.  On Eliquis at home.  Rate is controlled.   Gangrenous/infected toe on left foot.  Vascular surgery (fem-fem bypass, etc) would be quite high risk and patient does not want to do this.  Amputation would be a potential option that he is considering. If he opts to do neither vascular surgery nor amputation, I think that our best course here will be hospice/palliative care.  Given overall picture, with worsening renal function and end-stage CHF, I think that hospice/palliative care would certainly be a reasonable choice.  He is going to talk to our palliative care service hopefully today.  If he opts for hospice, will stop milrinone.   Loralie Champagne 02/06/2020 3:42 PM

## 2020-02-06 NOTE — Progress Notes (Signed)
[place] for Heparin (Apixaban on hold) Indication: atrial fibrillation  No Known Allergies  Patient Measurements: Heparin Dosing Weight: 73kg  Vital Signs: Temp: 98.1 F (36.7 C) (06/02 2327) Temp Source: Oral (06/02 2327) BP: 112/63 (06/02 2327) Pulse Rate: 82 (06/02 2327)  Labs: Recent Labs    02/04/20 1809 02/04/20 1809 02/05/20 0059 02/05/20 0433 02/05/20 0735 02/05/20 1703 02/06/20 0208  HGB 10.1*   < >  --  9.7*  --   --  9.6*  HCT 30.3*  --   --  28.5*  --   --  27.6*  PLT 475*  --   --  369  --   --  430*  APTT  --   --   --   --   --  130* 67*  HEPARINUNFRC  --   --   --   --   --  >2.20* >2.20*  CREATININE 4.15*  --   --   --  3.65*  --  3.17*  TROPONINIHS  --   --  13  --   --   --   --    < > = values in this interval not displayed.    Estimated Creatinine Clearance: 21.3 mL/min (A) (by C-G formula based on SCr of 3.17 mg/dL (H)).   Medical History: Past Medical History:  Diagnosis Date   AAA (abdominal aortic aneurysm) ([place])    a. 08/2015 Abd U/S: 2.7 x 2.9 x 3.2 cm infrarenal AAA.   Bradycardia    a. Requiring discontinuation of use of BB/CCB   Cardiac arrest - ventricular fibrillation    a. 03/2012 in setting of hypokalemia (prolonged hosp with VDRF, tracheobronchitis, ARF, shock liver, PAF, AMS felt secondary to post-anoxic encephalopathy/shock).   Carotid artery stenosis    a. 01/2011 - 40-59% bilateral stenosis;  b. 06/2015 Carotid U/S: 40-59% bilat ICA stenosis->f/u 6 mos.   CHF (congestive heart failure) (HCC)    CKD (chronic kidney disease), stage III    Coronary artery disease    a. s/p aborted ant STEMI tx with Cypher DES to LAD 10/04 (residual at cath: D1 50%, CFX 40% and multiple dist 70%, EF 55%);   b. myoview 3/10: Ef 47%, infero-apical isch, LOW RISK - med Tx recommended;  c. 12/2012 VF Arrest/Cath: LAD 40isr, 80 apical, LCX 100 (failed PTCA), RCA nondom.   Diabetes mellitus     Diverticulosis 2001   Elevated LFTs    Shock liver 03/2012   H/O: CVA (cardiovascular accident)    Hematemesis    a. 12/2010 felt 2/2 Mallory Weiss tear - pt could not afford colonscopy/EGD at that time so Coumadin was deferred. Coumadin initiated 03/2012 without any evidence for bleeding.   History of Ischemic Cardiomyopathy    a. EF 50-55% by echo 03/09/12 (was 30% by echo 02/24/12); b. 03/2013 Echo: EF 55-60%, mild MR, sev dil LA, mod dil RA, PASP 29mmHg.   Hypertensive heart disease    a. 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 Mild by echo 03/2012 and 03/2013.   Mixed Hyperlipidemia    Peripheral vascular disease ([place])    a. h/o LE angioplasty;  b. 08/2015 Duplex: >50% RCIA, 100% REIA, >50% LEIA, elev vel in SMA, patent IVC.  Persistent atrial fibrillation ([place])    a. Dx 12/2010-->coumadin (CHA2DS2VASc = 7).   QT prolongation    Tubular adenoma of colon 11/2019   Assessment: 79 y/o M on apixaban PTA for afib. Holding apixaban and starting heparin in anticipation of surgery. It has been >12 hours since last apixaban dose, will start heparin now. Hgb 9.7. Noted renal dysfunction. Anticipate using aPTT to dose for now.   6/3 AM update:  APTT therapeutic after rate decrease Hgb stable from yesterday   Goal of Therapy:  Heparin level 0.3-0.7 units/ml aPTT 66-102 seconds Monitor platelets by anticoagulation protocol: Yes   Plan:  Cont heparin at 1050 units/hr Confirmatory aPTT at [place], PharmD, [place] Pharmacist Phone: [phone]

## 2020-02-06 NOTE — Progress Notes (Signed)
[place] for Heparin (Apixaban on hold) Indication: atrial fibrillation  No Known Allergies  Patient Measurements: Heparin Dosing Weight: 73kg  Vital Signs: Temp: 98 F (36.7 C) (06/03 0400) Temp Source: Oral (06/03 0400) Pulse Rate: 90 (06/03 0400)  Labs: Recent Labs    02/04/20 1809 02/04/20 1809 02/05/20 0059 02/05/20 0433 02/05/20 0735 02/05/20 1703 02/06/20 0208 02/06/20 1054  HGB 10.1*   < >  --  9.7*  --   --  9.6*  --   HCT 30.3*  --   --  28.5*  --   --  27.6*  --   PLT 475*  --   --  369  --   --  430*  --   APTT  --   --   --   --   --  130* 67* 74*  HEPARINUNFRC  --   --   --   --   --  >2.20* >2.20*  --   CREATININE 4.15*  --   --   --  3.65*  --  3.17*  --   TROPONINIHS  --   --  13  --   --   --   --   --    < > = values in this interval not displayed.    Estimated Creatinine Clearance: 20.9 mL/min (A) (by C-G formula based on SCr of 3.17 mg/dL (H)).   Medical History: Past Medical History:  Diagnosis Date  . AAA (abdominal aortic aneurysm) ([place])    a. 08/2015 Abd U/S: 2.7 x 2.9 x 3.2 cm infrarenal AAA.  . Bradycardia    a. Requiring discontinuation of use of BB/CCB  . Cardiac arrest - ventricular fibrillation    a. 03/2012 in setting of hypokalemia (prolonged hosp with VDRF, tracheobronchitis, ARF, shock liver, PAF, AMS felt secondary to post-anoxic encephalopathy/shock).  . Carotid artery stenosis    a. 01/2011 - 40-59% bilateral stenosis;  b. 06/2015 Carotid U/S: 40-59% bilat ICA stenosis->f/u 6 mos.  . CHF (congestive heart failure) ([place])   . CKD (chronic kidney disease), stage III   . Coronary artery disease    a. s/p aborted ant STEMI tx with Cypher DES to LAD 10/04 (residual at cath: D1 50%, CFX 40% and multiple dist 70%, EF 55%);   b. myoview 3/10: Ef 47%, infero-apical isch, LOW RISK - med Tx recommended;  c. 12/2012 VF Arrest/Cath: LAD 40isr, 80 apical, LCX 100 (failed PTCA), RCA nondom.  . Diabetes  mellitus   . Diverticulosis 2001  . Elevated LFTs    Shock liver 03/2012  . H/O: CVA (cardiovascular accident)   . Hematemesis    a. 12/2010 felt 2/2 Mallory Weiss tear - pt could not afford colonscopy/EGD at that time so Coumadin was deferred. Coumadin initiated 03/2012 without any evidence for bleeding.  Marland Kitchen History of Ischemic Cardiomyopathy    a. EF 50-55% by echo 03/09/12 (was 30% by echo 02/24/12); b. 03/2013 Echo: EF 55-60%, mild MR, sev dil LA, mod dil RA, PASP 81mmHg.  Marland Kitchen Hypertensive heart disease    a. echo 4/12: EF 50%, asymmetric septal hypertrophy, no SAM or LVOT gradient, LAE, PASP 35  . Inguinal hernia   . 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 Iron deficiency anemia   . Mitral regurgitation    a. Mild by echo 03/2012 and 03/2013.  . Mixed Hyperlipidemia   . Peripheral vascular disease ([place])    a. h/o LE angioplasty;  b. 08/2015 Duplex: >50% RCIA, 100% REIA, >50% LEIA, elev vel in SMA, patent IVC.  Marland Kitchen Persistent atrial fibrillation ([place])    a. Dx 12/2010-->coumadin (CHA2DS2VASc = 7).  . QT prolongation   . Tubular adenoma of colon 11/2019   Assessment: 79 y/o M on apixaban PTA for afib. Holding apixaban and starting heparin in anticipation of surgery. It has been >12 hours since last apixaban dose, will start heparin now. Hgb 9.7. Noted renal dysfunction. Anticipate using aPTT to dose for now.   APTT therapeutic this afternoon, no bleeding issues noted.  Hgb stable from yesterday.  Goal of Therapy:  Heparin level 0.3-0.7 units/ml aPTT 66-102 seconds Monitor platelets by anticoagulation protocol: Yes   Plan:  Continue heparin at 1050 units/hr  Erin Hearing PharmD., BCPS Clinical Pharmacist 02/06/2020 1:17 PM

## 2020-02-06 NOTE — Consult Note (Addendum)
Palliative care consult note  Reason for consult: Goals of care in light of advanced heart failure, renal failure, and peripheral vascular disease with ischemia and infection of toe  Palliative care consult received.  Chart reviewed including personal review of pertinent labs and imaging.  Briefly, [name] is a 79 year old male with past medical history of combined chronic systolic/diastolic heart failure, biventricular failure secondary to ischemic cardiomyopathy, CAD status post prior PCI of LAD and LCx, remote history of VF arrest x2, permanent A. fib on chronic anticoagulation, CKD, severe lower extremity peripheral artery disease with known right external iliac occlusion and severe infrainguinal arterial occlusive disease with peroneal runoff only, L SFA occlusion with diffuse infrainguinal arterial occlusive disease who was recently scheduled for bypass surgery with vascular surgery on 5/21.  During that admission, goal was to maximize his cardiac status with assistance of heart failure team.  He worsened with cardiogenic shock but this improved with milrinone.  He was to a point where he had likely reached his medical maximization for consideration for surgery but declined to have surgical intervention after further discussion of risk versus benefit with vascular surgery and heart failure team.  He was transitioned off of milrinone and discharged home on 5/27.  After getting home, he developed worsening pain and presented back to the hospital.  His second toe had worsened with further ischemia and ulceration and is currently on IV antibiotics.  There is consideration for plan moving forward with either surgical intervention (likely amputation) versus consideration for transitioning to comfort care approach and possible election of hospice benefits.  Palliative consulted for goals of care.  I met today with Logan White.  We also called his wife who joined [place] in conversation via phone.  He reports  that feeling as well as he can, living as long as he can, and spending time with family and the most important things to him.  He most enjoys being at home on his patio where he watches TV or listens to the radio.  I asked his understanding of his condition and we further discussed his clinical course over the past several weeks with worsening of his peripheral arterial disease with ulceration and infection of his toe.  He reports that he has been in discussion with doctors for consideration for amputation at this point.  We discussed options for his care moving forward including prior scheduled revascularization procedure (he remains uninterested in this option due to high mortality risk that was discussed with him prior to surgery), consideration for amputation, refusal of amputation but continuation of other medical interventions (discussed that this would result in recurrence of infection and continued decline), or consideration for refocusing care on aggressive symptom management and more of a comfort based approach.  Logan White is clear that he is not at a point where he is interested in consideration for comfort care and hospice.  We discussed this as well as prior MOST form that was scanned in his chart that had outlined plan for DNR with continuation of other interventions.  He tells me this is a mistake and that he has never expressed desire for no attempts at resuscitation in the event of cardiac arrest.  I therefore voided the electronic copy of the most form on file in his chart.  He and his wife report that they have been considering his options and while he is not interested in large revascularization procedure, he would like to pursue surgical intervention for his infected, necrotic toe.  Discussed  that this is not without risk and he understands that surgical intervention for his foot is not going to change the fact that he has end-stage heart disease as well as renal failure.  He expressed  understanding this and his goal is to be able to return home eventually after having procedure completed.  Discussed that if he is not having desired outcomes following amputation, I would be happy to reassess his overall situation with further consideration for changing to a less aggressive care plan if this lines up better with his goals.  -Full code/full scope treatment -He is not interested in consideration for hospice. -He is not interested in large bypass procedure.   -He would like to pursue amputation for necrotic, infected wounds on his foot. -Discussed this in conjunction with him as well as his wife.  Total time: 75 minutes  Greater than 50%  of this time was spent counseling and coordinating care related to the above assessment and plan.  Micheline Rough, MD [facility] [phone]

## 2020-02-06 NOTE — Progress Notes (Signed)
[facility] Progress Note  Patient: Logan White    MGQ:[id number]  DOA: 02/04/2020     Date of Service: the patient was seen and examined on 02/06/2020  Chief Complaint  Patient presents with  . Weakness   Brief hospital course: Past medical history of combined CHF, CAD, V. fib arrest x2, A. fib on Eliquis, chronic kidney disease stage IV, hypertension, CVA.  Presented with progressive PVD and wound infection as well as ongoing acute on chronic CHF.  Cardiology palliative care and vascular surgery consulted. Currently further plan is continue to biotics and continue to engage with goals of care..  Assessment and Plan: Infected necrotic left second toe with signs of cellulitis/severe sepsis in the setting of known severe PAD:  X-ray of left foot without evidence of osteomyelitis. Has known history of severe PAD for which patient had decided not to undergo surgery during his recent hospitalization given complicated clinical course/decompensated CHF with cardiogenic shock. Vascular surgery consulted. MRSA PCR negative production continue with IV Zosyn.  Severe PAD/critical lower limb ischemia:  Vascular surgery been consulted for Eliquis changed to heparin. Currently awaiting clearance from cardiology and clarity from goals of care with palliative care consultation.  Chronic combined systolic and diastolic CHF/biventricular failure secondary to ischemic cardiomyopathy:  Last echo done January 2021 with EF 25 to 30%.  Heart failure services consulted. Currently receiving Primacor for optimization prior to consideration for surgery. Appreciate assistance.  Chronic hyponatremia: Likely related to advanced heart failure.  Sodium 125 but stable compared to labs done during recent hospitalization. Monitor.  Mild hyperkalemia: Take spironolactone and potassium supplement at home.  Potassium 5.5.  EKG without acute changes. -Hold spironolactone and potassium supplement.     Lokelma given  AKI on CKD stage III-IV:  Cardiorenal hemodynamics. Improving with much Primacor. Monitor.  Permanent atrial fibrillation: Currently rate controlled. -Hold Eliquis and start IV heparin in anticipation of surgery for critical limb ischemia.  Hold beta-blocker given hypotension.  Hypertension -Hold antihypertensives given hypotension  Diet: Cardiac diet DVT Prophylaxis: Therapeutic Anticoagulation with Heparin   Advance goals of care discussion: Full code  Family Communication: no family was present at bedside, at the time of interview.   Disposition:  Status is: Inpatient  Remains inpatient appropriate because:IV treatments appropriate due to intensity of illness or inability to take PO   Dispo: The patient is from: Home              Anticipated d/c is to: to be determined               Anticipated d/c date is: > 3 days              Patient currently is not medically stable to d/c.        Subjective: Denies any acute pain.  No nausea no vomiting.  No breathing.  No acute complaints.  Does not remember that he asked for pain medication yesterday.  Physical Exam: General:  alert oriented to time, place, and person.  Appear in mild distress, affect appropriate Eyes: PERRL ENT: Oral Mucosa Clear, moist  Neck: difficult to assess  JVD,  Cardiovascular: S1 and S2 Present, no Murmur,  Respiratory: increased respiratory effort, Bilateral Air entry equal and Decreased, bilateral  Crackles, no wheezes Abdomen: Bowel Sound present, Soft and no tenderness,  Skin: no rash Extremities: trace Pedal edema, no calf tenderness Neurologic: without any new focal findings Gait not checked due to patient safety concerns  Vitals:   02/06/20  0809 02/06/20 1432 02/06/20 1934 02/06/20 2031  BP:  (!) 88/47 109/63   Pulse:  87 95 89  Resp:  18 20 16   Temp:   98.6 F (37 C)   TempSrc:   Oral   SpO2: 92% 94% 99% 99%  Weight:      Height:        Intake/Output  Summary (Last 24 hours) at 02/06/2020 2052 Last data filed at 02/06/2020 1934 Gross per 24 hour  Intake 1249.74 ml  Output 1250 ml  Net -0.26 ml   Filed Weights   02/05/20 0521 02/06/20 0500  Weight: 76 kg 74.6 kg    Data Reviewed: I have personally reviewed and interpreted daily labs, tele strips, imagings as discussed above. I reviewed all nursing notes, pharmacy notes, vitals, pertinent old records I have discussed plan of care as described above with RN and patient/family.  CBC: Recent Labs  Lab 02/04/20 1809 02/05/20 0433 02/06/20 0208  WBC 15.5* 13.5* 11.1*  HGB 10.1* 9.7* 9.6*  HCT 30.3* 28.5* 27.6*  MCV 75.8* 74.2* 72.8*  PLT 475* 369 158*   Basic Metabolic Panel: Recent Labs  Lab 02/04/20 1809 02/05/20 0735 02/06/20 0208  NA 125* 126* 128*  K 5.5* 5.5* 4.6  CL 93* 94* 94*  CO2 22 19* 24  GLUCOSE 125* 90 125*  BUN 56* 59* 57*  CREATININE 4.15* 3.65* 3.17*  CALCIUM 8.5* 8.6* 8.6*  PHOS  --  3.4  --     Studies: No results found.  Scheduled Meds: . ferrous sulfate  325 mg Oral BID  . gabapentin  100 mg Oral QHS  . mometasone-formoterol  2 puff Inhalation BID  . pantoprazole  40 mg Oral BID  . sodium chloride flush  3 mL Intravenous Q12H  . torsemide  100 mg Oral Daily   Continuous Infusions: . sodium chloride    . heparin 1,050 Units/hr (02/06/20 1934)  . milrinone 0.25 mcg/kg/min (02/06/20 1934)  . piperacillin-tazobactam (ZOSYN)  IV 2.25 g (02/06/20 1332)   PRN Meds: sodium chloride, acetaminophen, fentaNYL (SUBLIMAZE) injection, HYDROcodone-acetaminophen, sodium chloride flush  Time spent: 35 minutes  Author: Berle Mull, MD [facility] 02/06/2020 8:52 PM  To reach On-call, see care teams to locate the attending and reach out to them via [email or web address]. If 7PM-7AM, please contact night-coverage If you still have difficulty reaching the attending provider, please page the [facility] (Director on Call) for [facility] on amion for  assistance.

## 2020-02-06 NOTE — Progress Notes (Signed)
Subjective  -   He is currently without pain in his legs however he does say that his left leg bothers him at times.   Physical Exam:  Open wound to left second toe.  Stable appearance of wound on right great toe Nonpalpable pedal pulses Coarse breath sounds but nonlabored breathing       Assessment/Plan:    Bilateral lower extremity ulcerations with progression of the wound on his left toe with cellulitis.  He has been admitted and placed on antibiotics.  His white blood cell count has improved with antibiotics.  He is being treated for his heart failure.  He is end-stage.  He was evaluated by palliative care today and wants to live as long as possible without comfort care at this time.  He continues to not be interested in lower extremity revascularization.  In an attempt for limb salvage he would require extensive surgery which I do not feel he is a candidate for.  Therefore his best option is going to be a left below versus above-knee amputation.  We will continue to follow him.  He has yet to decide on definitive plans for management of his left leg.  Wells Casia Corti 02/06/2020 9:29 PM --  Vitals:   02/06/20 1934 02/06/20 2031  BP: 109/63   Pulse: 95 89  Resp: 20 16  Temp: 98.6 F (37 C)   SpO2: 99% 99%    Intake/Output Summary (Last 24 hours) at 02/06/2020 2129 Last data filed at 02/06/2020 1934 Gross per 24 hour  Intake 1249.74 ml  Output 1250 ml  Net -0.26 ml     Laboratory CBC    Component Value Date/Time   WBC 11.1 (H) 02/06/2020 0208   HGB 9.6 (L) 02/06/2020 0208   HGB 12.7 (L) 07/29/2019 1004   HCT 27.6 (L) 02/06/2020 0208   HCT 38.3 07/29/2019 1004   PLT 430 (H) 02/06/2020 0208   PLT 191 07/29/2019 1004    BMET    Component Value Date/Time   NA 128 (L) 02/06/2020 0208   NA 132 (L) 12/10/2019 1409   K 4.6 02/06/2020 0208   CL 94 (L) 02/06/2020 0208   CO2 24 02/06/2020 0208   GLUCOSE 125 (H) 02/06/2020 0208   BUN 57 (H) 02/06/2020 0208   BUN  63 (H) 12/10/2019 1409   CREATININE 3.17 (H) 02/06/2020 0208   CREATININE 1.34 (H) 10/31/2016 0844   CALCIUM 8.6 (L) 02/06/2020 0208   GFRNONAA 18 (L) 02/06/2020 0208   GFRNONAA 46 (L) 10/24/2014 0943   GFRAA 21 (L) 02/06/2020 0208   GFRAA 53 (L) 10/24/2014 0943    COAG Lab Results  Component Value Date   INR 1.9 (H) 01/23/2020   INR 2.5 (H) 11/25/2019   INR 2.5 (H) 09/30/2019   No results found for: PTT  Antibiotics Anti-infectives (From admission, onward)   Start     Dose/Rate Route Frequency Ordered Stop   02/05/20 0600  piperacillin-tazobactam (ZOSYN) IVPB 2.25 g     2.25 g 100 mL/hr over 30 Minutes Intravenous Every 8 hours 02/05/20 0439     02/05/20 0030  piperacillin-tazobactam (ZOSYN) IVPB 3.375 g     3.375 g 100 mL/hr over 30 Minutes Intravenous  Once 02/05/20 0017 02/05/20 0121   02/05/20 0030  vancomycin (VANCOCIN) IVPB 1000 mg/200 mL premix     1,000 mg 200 mL/hr over 60 Minutes Intravenous  Once 02/05/20 0022 02/05/20 0237       V. Leia Alf, M.D.,  FACS [facility] Office: [phone] Pager:  [phone]

## 2020-02-07 ENCOUNTER — Other Ambulatory Visit: Payer: Self-pay | Admitting: *Deleted

## 2020-02-07 DIAGNOSIS — Z515 Encounter for palliative care: Secondary | ICD-10-CM

## 2020-02-07 DIAGNOSIS — I998 Other disorder of circulatory system: Secondary | ICD-10-CM

## 2020-02-07 DIAGNOSIS — N179 Acute kidney failure, unspecified: Secondary | ICD-10-CM

## 2020-02-07 LAB — CBC
HCT: 25.8 % — ABNORMAL LOW (ref 39.0–52.0)
Hemoglobin: 8.9 g/dL — ABNORMAL LOW (ref 13.0–17.0)
MCH: 24.9 pg — ABNORMAL LOW (ref 26.0–34.0)
MCHC: 34.5 g/dL (ref 30.0–36.0)
MCV: 72.3 fL — ABNORMAL LOW (ref 80.0–100.0)
Platelets: 384 10*3/uL (ref 150–400)
RBC: 3.57 MIL/uL — ABNORMAL LOW (ref 4.22–5.81)
RDW: 18.9 % — ABNORMAL HIGH (ref 11.5–15.5)
WBC: 9.5 10*3/uL (ref 4.0–10.5)
nRBC: 0.3 % — ABNORMAL HIGH (ref 0.0–0.2)

## 2020-02-07 LAB — BASIC METABOLIC PANEL
Anion gap: 13 (ref 5–15)
BUN: 50 mg/dL — ABNORMAL HIGH (ref 8–23)
CO2: 23 mmol/L (ref 22–32)
Calcium: 8.2 mg/dL — ABNORMAL LOW (ref 8.9–10.3)
Chloride: 95 mmol/L — ABNORMAL LOW (ref 98–111)
Creatinine, Ser: 2.53 mg/dL — ABNORMAL HIGH (ref 0.61–1.24)
GFR calc Af Amer: 27 mL/min — ABNORMAL LOW (ref >60)
GFR calc non Af Amer: 24 mL/min — ABNORMAL LOW (ref >60)
Glucose, Bld: 145 mg/dL — ABNORMAL HIGH (ref 70–99)
Potassium: 4.2 mmol/L (ref 3.5–5.1)
Sodium: 131 mmol/L — ABNORMAL LOW (ref 135–145)

## 2020-02-07 LAB — APTT
aPTT: 58 seconds — ABNORMAL HIGH (ref 24–36)
aPTT: 120 seconds — ABNORMAL HIGH (ref 24–36)
aPTT: 79 seconds — ABNORMAL HIGH (ref 24–36)

## 2020-02-07 LAB — HEPARIN LEVEL (UNFRACTIONATED): Heparin Unfractionated: 1.76 IU/mL — ABNORMAL HIGH (ref 0.30–0.70)

## 2020-02-07 MED ORDER — ACETAMINOPHEN 500 MG PO TABS
1000.0000 mg | ORAL_TABLET | Freq: Three times a day (TID) | ORAL | Status: DC
  Administered 2020-02-07 – 2020-02-10 (×9): 1000 mg via ORAL
  Filled 2020-02-07 (×9): qty 2

## 2020-02-07 MED ORDER — PIPERACILLIN-TAZOBACTAM 3.375 G IVPB
3.3750 g | Freq: Three times a day (TID) | INTRAVENOUS | Status: DC
  Administered 2020-02-07 – 2020-02-10 (×9): 3.375 g via INTRAVENOUS
  Filled 2020-02-07 (×8): qty 50

## 2020-02-07 MED ORDER — LIDOCAINE 5 % EX PTCH
1.0000 | MEDICATED_PATCH | CUTANEOUS | Status: DC
  Administered 2020-02-07 – 2020-02-09 (×3): 1 via TRANSDERMAL
  Filled 2020-02-07 (×3): qty 1

## 2020-02-07 MED ORDER — HYDROMORPHONE HCL 2 MG PO TABS
1.0000 mg | ORAL_TABLET | ORAL | Status: DC | PRN
  Administered 2020-02-07 – 2020-02-08 (×4): 1 mg via ORAL
  Filled 2020-02-07 (×4): qty 1

## 2020-02-07 NOTE — Progress Notes (Signed)
  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; L 2nd toe open wound Neurologic: A&O  CBC    Component Value Date/Time   WBC 9.5 02/07/2020 0252   RBC 3.57 (L) 02/07/2020 0252   HGB 8.9 (L) 02/07/2020 0252   HGB 12.7 (L) 07/29/2019 1004   HCT 25.8 (L) 02/07/2020 0252   HCT 38.3 07/29/2019 1004   PLT 384 02/07/2020 0252   PLT 191 07/29/2019 1004   MCV 72.3 (L) 02/07/2020 0252   MCV 80 07/29/2019 1004   MCH 24.9 (L) 02/07/2020 0252   MCHC 34.5 02/07/2020 0252   RDW 18.9 (H) 02/07/2020 0252   RDW 15.6 (H) 07/29/2019 1004   LYMPHSABS 0.9 01/22/2020 1850   MONOABS 1.0 01/22/2020 1850   EOSABS 0.1 01/22/2020 1850   BASOSABS 0.0 01/22/2020 1850    BMET    Component Value Date/Time   NA 131 (L) 02/07/2020 0252   NA 132 (L) 12/10/2019 1409   K 4.2 02/07/2020 0252   CL 95 (L) 02/07/2020 0252   CO2 23 02/07/2020 0252   GLUCOSE 145 (H) 02/07/2020 0252   BUN 50 (H) 02/07/2020 0252   BUN 63 (H) 12/10/2019 1409   CREATININE 2.53 (H) 02/07/2020 0252   CREATININE 1.34 (H) 10/31/2016 0844   CALCIUM 8.2 (L) 02/07/2020 0252   GFRNONAA 24 (L) 02/07/2020 0252   GFRNONAA 46 (L) 10/24/2014 0943   GFRAA 27 (L) 02/07/2020 0252   GFRAA 53 (L) 10/24/2014 0943    INR    Component Value Date/Time   INR 1.9 (H) 01/23/2020 2359     Intake/Output Summary (Last 24 hours) at 02/07/2020 0743 Last data filed at 02/07/2020 0354 Gross per 24 hour  Intake 1072.93 ml  Output 3050 ml  Net -1977.07 ml     Assessment/Plan:  80 y.o. male with worsening L 2nd toe wound  -Not a candidate for extensive revascularization surgery as previously discussed -Patient will need L above vs below knee amputation  however is not willing to proceed at this time -I explained to the patient a simple toe amputation will not heal; above or below knee amputation is performed to optimize his chances of healing and to try and prevent further surgery -Palliative care met with patient yesterday and plans to return to finish discussion today -Continue IV abx; WBC wnl   Dagoberto Ligas, PA-C [facility] [phone] 02/07/2020 7:43 AM

## 2020-02-07 NOTE — Progress Notes (Signed)
[facility] Progress Note  Patient: Logan White    VVO:[id number]  DOA: 02/04/2020     Date of Service: the patient was seen and examined on 02/07/2020  Chief Complaint  Patient presents with  . Weakness   Brief hospital course: Past medical history of combined CHF, CAD, V. fib arrest x2, A. fib on Eliquis, chronic kidney disease stage IV, hypertension, CVA.  Presented with progressive PVD and wound infection as well as ongoing acute on chronic CHF.  Cardiology palliative care and vascular surgery consulted. Currently further plan is continue to biotics and continue to engage with goals of care.  Assessment and Plan: Infected necrotic left second toe with signs of cellulitis/severe sepsis in the setting of known severe PAD: X-ray of left foot without evidence of osteomyelitis. Has known history of severe PAD for which patient had decided not to undergo surgery during his recent hospitalization given complicated clinical course/decompensated CHF with cardiogenic shock. Vascular surgery consulted. Per surgery patient is not a candidate for extensive revascularization. Will need left AKA or BKA however not willing to proceed at this time. Patient is looking forward to to amputation which will not heal well. Prognosis for surgery regardless of the level of amputation given his heart failure. MRSA PCR negative production continue with IV Zosyn.  Severe PAD/critical lower limb ischemia:  Vascular surgery been consulted for Eliquis changed to heparin. Currently awaiting clearance from cardiology and clarity from goals of care with palliative care consultation.  Chronic combined systolic and diastolic CHF/biventricular failure secondary to ischemic cardiomyopathy:  Last echo done January 2021 with EF 25 to 30%.  Heart failure services consulted. Currently receiving Primacor for optimization prior to consideration for surgery. Appreciate assistance.  Chronic hyponatremia: Likely  related to advanced heart failure.  Sodium 125 but stable compared to labs done during recent hospitalization. Monitor.  Mild hyperkalemia: Take spironolactone and potassium supplement at home.  Potassium 5.5.  EKG without acute changes. -Hold spironolactone and potassium supplement.   Lokelma given  AKI on CKD stage III-IV:  Cardiorenal hemodynamics. Improving with much Primacor. Monitor.  Permanent atrial fibrillation: Currently rate controlled. -Hold Eliquis and start IV heparin in anticipation of surgery for critical limb ischemia.  Hold beta-blocker given hypotension.  Hypertension -Hold antihypertensives given hypotension  Diet: Cardiac diet DVT Prophylaxis: Therapeutic Anticoagulation with Heparin   Advance goals of care discussion: Full code  Family Communication: no family was present at bedside, at the time of interview.   Disposition:  Status is: Inpatient  Remains inpatient appropriate because:IV treatments appropriate due to intensity of illness or inability to take PO   Dispo: The patient is from: Home              Anticipated d/c is to: to be determined               Anticipated d/c date is: > 3 days              Patient currently is not medically stable to d/c.        Subjective: Pain present.  No nausea no vomiting.  No fever no chills.  Breathing okay  Physical Exam: General:  alert oriented to time, place, and person.  Appear in mild distress, affect appropriate Eyes: PERRL ENT: Oral Mucosa Clear, moist  Neck: difficult to assess  JVD,  Cardiovascular: S1 and S2 Present, no Murmur,  Respiratory: increased respiratory effort, Bilateral Air entry equal and Decreased, bilateral  Crackles, no wheezes Abdomen: Bowel Sound  present, Soft and no tenderness,  Skin: no rash Extremities: trace Pedal edema, no calf tenderness Neurologic: without any new focal findings Gait not checked due to patient safety concerns  Vitals:   02/07/20 0802 02/07/20  0839 02/07/20 1138 02/07/20 1600  BP:  (!) 94/58 115/69 117/70  Pulse:  96 77 99  Resp:  18 (!) 21 19  Temp:  98.1 F (36.7 C) 98.5 F (36.9 C) 98.9 F (37.2 C)  TempSrc:  Oral Oral Oral  SpO2: 98% 96% 95% 96%  Weight:      Height:        Intake/Output Summary (Last 24 hours) at 02/07/2020 1922 Last data filed at 02/07/2020 1900 Gross per 24 hour  Intake 1099.23 ml  Output 1800 ml  Net -700.77 ml   Filed Weights   02/05/20 0521 02/06/20 0500 02/07/20 0358  Weight: 76 kg 74.6 kg 73.3 kg    Data Reviewed: I have personally reviewed and interpreted daily labs, tele strips, imagings as discussed above. I reviewed all nursing notes, pharmacy notes, vitals, pertinent old records I have discussed plan of care as described above with RN and patient/family.  CBC: Recent Labs  Lab 02/04/20 1809 02/05/20 0433 02/06/20 0208 02/07/20 0252  WBC 15.5* 13.5* 11.1* 9.5  HGB 10.1* 9.7* 9.6* 8.9*  HCT 30.3* 28.5* 27.6* 25.8*  MCV 75.8* 74.2* 72.8* 72.3*  PLT 475* 369 430* 700   Basic Metabolic Panel: Recent Labs  Lab 02/04/20 1809 02/05/20 0735 02/06/20 0208 02/07/20 0252  NA 125* 126* 128* 131*  K 5.5* 5.5* 4.6 4.2  CL 93* 94* 94* 95*  CO2 22 19* 24 23  GLUCOSE 125* 90 125* 145*  BUN 56* 59* 57* 50*  CREATININE 4.15* 3.65* 3.17* 2.53*  CALCIUM 8.5* 8.6* 8.6* 8.2*  PHOS  --  3.4  --   --     Studies: No results found.  Scheduled Meds: . acetaminophen  1,000 mg Oral TID  . ferrous sulfate  325 mg Oral BID  . gabapentin  100 mg Oral QHS  . lidocaine  1 patch Transdermal Q24H  . mometasone-formoterol  2 puff Inhalation BID  . pantoprazole  40 mg Oral BID  . sodium chloride flush  3 mL Intravenous Q12H  . torsemide  100 mg Oral Daily   Continuous Infusions: . sodium chloride    . heparin 1,050 Units/hr (02/07/20 1900)  . milrinone 0.25 mcg/kg/min (02/07/20 1900)  . piperacillin-tazobactam (ZOSYN)  IV 3.375 g (02/07/20 1452)   PRN Meds: sodium chloride,  acetaminophen, fentaNYL (SUBLIMAZE) injection, HYDROmorphone, sodium chloride flush  Time spent: 35 minutes  Author: Berle Mull, MD [facility] 02/07/2020 7:22 PM  To reach On-call, see care teams to locate the attending and reach out to them via [email or web address]. If 7PM-7AM, please contact night-coverage If you still have difficulty reaching the attending provider, please page the [facility] (Director on Call) for [facility] on amion for assistance.

## 2020-02-07 NOTE — Progress Notes (Signed)
Daily Progress Note   Patient Name: Logan White       Date: 02/07/2020 DOB: 1941-03-10  Age: 79 y.o. MRN#: [id number] Attending Physician: Lavina Hamman, MD Primary Care Physician: Patient, No Pcp Per Admit Date: 02/04/2020  Reason for Consultation/Follow-up: To discuss complex medical decision making related to patient's goals of care  Subjective: Spoke with patient at bedside and his wife on the phone.  Patient describes spasms of pain that are very severe in LLE.    We discussed the fact that Logan White has end stage heart disease and advanced kidney disease.  His nutritional status is dropping as well.  If he has surgery at all it will be high risk.  Estimated prognosis with surgery is months to perhaps 1 year.  Estimated prognosis without surgery is likely weeks to perhaps 1-2 months.  These time frames were very concerning to Logan White.  Logan White states that he always drives forward and nothing gets him down.   Both Logan White and Mardene Celeste wanted to pray on it and talk with the rest of the family.  We scheduled a meeting for 12:00 on Saturday for 6 family members to join [place] for a discussion.  Logan White is determined to decide this issue tomorrow.    Assessment: 19 yom ES biventricular heart failure, stage 3-4 CKD, albumin 2.2   Severe PVD.  Dry gangrene in LLE.  Would require a LLE amputation.  Would expect him to need bilateral lower extremity amputations eventually.   Patient Profile/HPI:  Logan White is a 79 year old male with past medical history of combined chronic systolic/diastolic heart failure, biventricular failure secondary to ischemic cardiomyopathy, CAD status post prior PCI of LAD and LCx, remote history of VF arrest x2, permanent A. fib on chronic anticoagulation, CKD,  severe lower extremity peripheral artery disease with known right external iliac occlusion and severe infrainguinal arterial occlusive disease with peroneal runoff only, L SFA occlusion with diffuse infrainguinal arterial occlusive disease who was recently scheduled for bypass surgery with vascular surgery on 5/21.  During that admission, goal was to maximize his cardiac status with assistance of heart failure team.  He worsened with cardiogenic shock but this improved with milrinone.  He was to a point where he had likely reached his medical maximization for consideration for surgery but declined to have surgical intervention  after further discussion of risk versus benefit with vascular surgery and heart failure team.  He was transitioned off of milrinone and discharged home on 5/27.  After getting home, he developed worsening pain and presented back to the hospital.  His second toe had worsened with further ischemia and ulceration and is currently on IV antibiotics.  There is consideration for plan moving forward with either surgical intervention (likely amputation) versus consideration for transitioning to comfort care approach and possible election of hospice benefits.  Palliative consulted for goals of care.    Length of Stay: 2   Vital Signs: BP 115/69 (BP Location: Right Arm)   Pulse 77   Temp 98.5 F (36.9 C) (Oral)   Resp (!) 21   Ht 6\' 3"  (1.905 m)   Wt 73.3 kg   SpO2 95%   BMI 20.20 kg/m  SpO2: SpO2: 95 % O2 Device: O2 Device: Room Air O2 Flow Rate:         Palliative Assessment/Data:  30%     Palliative Care Plan    Recommendations/Plan:  Extended family meeting on Saturday at 12:00 (anticipate 6 family members).    Scheduled tylenol.   Added oral dilaudid PRN instead of vicodin.  Added lidocaine patch.  Code Status:  Full code  Prognosis:   Unable to determine   Discharge Planning:  To Be Determined  Care plan was discussed with Dr. Aundra Dubin, Vascular PA-C,  Attending [facility] MD  Thank you for allowing the Palliative Medicine Team to assist in the care of this patient.  Total time spent: 60 min.     Greater than 50%  of this time was spent counseling and coordinating care related to the above assessment and plan.  Florentina Jenny, PA-C Palliative Medicine  Please contact Palliative MedicineTeam phone at [phone] for questions and concerns between 7 am - 7 pm.   Please see AMION for individual provider pager numbers.

## 2020-02-07 NOTE — Progress Notes (Addendum)
Advanced Heart Failure Rounding Note  PCP-Cardiologist: Kirk Ruths, MD   Subjective:    Started on milrinone 0.25 mcg for suspected low output heart failure. .   Complaining of LLE pain. Denies SOB  Objective:   Weight Range: 73.3 kg Body mass index is 20.2 kg/m.   Vital Signs:   Temp:  [98.1 F (36.7 C)-98.6 F (37 C)] 98.1 F (36.7 C) (06/04 0839) Pulse Rate:  [87-104] 96 (06/04 0839) Resp:  [16-20] 18 (06/04 0839) BP: (88-113)/(47-65) 94/58 (06/04 0839) SpO2:  [94 %-99 %] 96 % (06/04 0839) Weight:  [73.3 kg] 73.3 kg (06/04 0358) Last BM Date: (pt states couple of days ago)  Weight change: Filed Weights   02/05/20 0521 02/06/20 0500 02/07/20 0358  Weight: 76 kg 74.6 kg 73.3 kg    Intake/Output:   Intake/Output Summary (Last 24 hours) at 02/07/2020 1015 Last data filed at 02/07/2020 1000 Gross per 24 hour  Intake 1064.43 ml  Output 3050 ml  Net -1985.57 ml      Physical Exam    General:  In bed.  No resp difficulty HEENT: normal Neck: supple. JVP 7-8. Carotids 2+ bilat; no bruits. No lymphadenopathy or thryomegaly appreciated. Cor: PMI nondisplaced. Irregular rate & rhythm. No rubs, gallops or murmurs. Lungs: clear Abdomen: soft, nontender, nondistended. No hepatosplenomegaly. No bruits or masses. Good bowel sounds. Extremities: no cyanosis, clubbing, rash, edema. L foot dressing intact  Neuro: alert & orientedx3, cranial nerves grossly intact. moves all 4 extremities w/o difficulty. Affect pleasant   Telemetry   A fib 90-100s   EKG    N/A  Labs    CBC Recent Labs    02/06/20 0208 02/07/20 0252  WBC 11.1* 9.5  HGB 9.6* 8.9*  HCT 27.6* 25.8*  MCV 72.8* 72.3*  PLT 430* 811   Basic Metabolic Panel Recent Labs    02/05/20 0735 02/05/20 0735 02/06/20 0208 02/07/20 0252  NA 126*   < > 128* 131*  K 5.5*   < > 4.6 4.2  CL 94*   < > 94* 95*  CO2 19*   < > 24 23  GLUCOSE 90   < > 125* 145*  BUN 59*   < > 57* 50*  CREATININE 3.65*    < > 3.17* 2.53*  CALCIUM 8.6*   < > 8.6* 8.2*  PHOS 3.4  --   --   --    < > = values in this interval not displayed.   Liver Function Tests Recent Labs    02/05/20 0735  ALBUMIN 2.2*   No results for input(s): LIPASE, AMYLASE in the last 72 hours. Cardiac Enzymes No results for input(s): CKTOTAL, CKMB, CKMBINDEX, TROPONINI in the last 72 hours.  BNP: BNP (last 3 results) Recent Labs    12/26/19 1515 01/22/20 1850 02/04/20 1845  BNP 1,843.7* 2,557.6* 1,863.4*    ProBNP (last 3 results) Recent Labs    08/15/19 0949  PROBNP 16,511*     D-Dimer No results for input(s): DDIMER in the last 72 hours. Hemoglobin A1C No results for input(s): HGBA1C in the last 72 hours. Fasting Lipid Panel No results for input(s): CHOL, HDL, LDLCALC, TRIG, CHOLHDL, LDLDIRECT in the last 72 hours. Thyroid Function Tests No results for input(s): TSH, T4TOTAL, T3FREE, THYROIDAB in the last 72 hours.  Invalid input(s): FREET3  Other results:   Imaging    No results found.   Medications:     Scheduled Medications: . ferrous sulfate  325 mg  Oral BID  . gabapentin  100 mg Oral QHS  . mometasone-formoterol  2 puff Inhalation BID  . pantoprazole  40 mg Oral BID  . sodium chloride flush  3 mL Intravenous Q12H  . torsemide  100 mg Oral Daily    Infusions: . sodium chloride    . heparin 1,150 Units/hr (02/07/20 1000)  . milrinone 0.25 mcg/kg/min (02/07/20 1000)  . piperacillin-tazobactam (ZOSYN)  IV 2.25 g (02/07/20 0551)    PRN Medications: sodium chloride, acetaminophen, fentaNYL (SUBLIMAZE) injection, HYDROcodone-acetaminophen, sodium chloride flush    Patient Profile  79 y/o male with chronic combined systolic/ diastolic CHF/ biventricular failure 2/2 ischemic CM, CAD s/p prior PCI of the LAD and LCx, remote h/o VF arrest x 2 (first in 2013 in the setting of hypokalemia (K of 2.2) and again in 2014 in setting of MI), no ICD, h/o permanent atrial fibrillation on chronic  a/c w/ apxiaban, CKD (basline SCr ~ 2.0-2.3) and severe LE PAD w/ knownR external iliac artery occlusion with severe infrainguinal arterial occlusive disease and peroneal runoff only. L SFA occlusion with diffuse infrainguinal arterial occlusive disease, being followed by VVS.   Admitted with cellulitis, AKI, and recurrent low output.   Assessment/Plan   1. Infected Necrotic Left 2nd Great Toe w/ Cellulitis - in the setting of severe PAD  - WBC 16>>13K>11>>9.5 Remains afebrile.  - continue Zosyn   2. Chronic Biventricular CHF ->cardiogenic shock - known ICM dating back to 2013.  - Echo 1/21showed LVEF 25-30%, global hypokinesis, mild LVH, RV mildly enlarged w/ severely reduced systolic function. Moderate MR. Mild TR. Severe bi atrial enlargement. - RHC on 3/25 showed elevated biventricular filling pressures and with equalization of R &L sided diastolic pressuresand preserved CO consistent withrestrictive physiology. - Developed cardiogenic shock last admission 5/20 with initial co-ox 36%. Hemodynamics markedly improved on milrinone 0.25 but he was weaned off - Readmitted primarily for infected left great toe w/ cellulitis but showing signs of low output, w/ worsening renal function w/ SCr up to 4.15 (2.65 on 5/27).  - He previously declined vascular surgery as he was felt too high risk but now reconsidering.  - Continue empiric milrinone 0.25 mcg to try to optimize, in the event that he reconsiders.  -- No ARB/ARNI with CKD IV - Volume status stable. Continue torsemide.  - renal function improving.   3. PAD w/ Claudication  - knownR external iliac artery occlusion with severe infrainguinal arterial occlusive disease and peroneal runoff only. L SFA occlusion with diffuse infrainguinal arterial occlusive disease -Dr. Trula Slade planned forleft iliac stent +/- bilateral femoral endarterectomies, and a left to right femorofemoral bypass -He was optimized for surgery but after a  detailed discussion of the risks he refused. -he is wanting to pursue amputation,.   4.CKD IV:  - Creatinine baseline ~2.5 - Creatinine up to 4.15 on admit>>3.65>>3.2>>2.5  - suspect cardiorenal syndrome - Improved with milrinone 0.25 mcg/kg/min  5CAD:  - prior LAD and LCx stenting - nos/s angina - no ASA due to apixaban - continue high dose statin   6.Chronic AF - Rate controlled.  - monitor closely w/ milrinone - can use amiodarone gtt if needed  -Off b-blocker due to shock. - on Eliquis as outpatient . Now on hold  - continue IV heparin for now until plan further definced.   7. Hyponatremia - Na 131 today. Likely due to HF. - mentating ok - Restrict free water.    8. Hyperkalemia - K 5.5 on admit, in  the setting of worsening renal failure  - Received Lokelma - Potassium stable   9. Anemia, chronic disease - required feraheme last admission - Hgb trending down. No obvious source.     10. Goals Of Care Palliative Care consulted and appreciated.   He wants to consider amputation but his wife says it will be risky. I spoke to his wife on the phone.   He and his wife want to talk with Dr Trula Slade.    Length of Stay: 2  Amy Clegg, NP  02/07/2020, 10:15 AM  Advanced Heart Failure Team Pager [phone] (M-F; 7a - 4p)  Please contact [facility] for night-coverage after hours (4p -7a ) and weekends on [email or web address]  Patient seen with NP, agree with the above note.   He is stable today, creatinine down to 2.5.  No dyspnea at rest.  Remains on milrinone 0.25 + torsemide 100 mg daily.   General: NAD Neck: No JVD, no thyromegaly or thyroid nodule.  Lungs: Clear to auscultation bilaterally with normal respiratory effort. CV: Lateral PMI.  Heart irregular S1/S2, no S3/S4, no murmur.  No peripheral edema.   Abdomen: Soft, nontender, no hepatosplenomegaly, no distention.  Skin: Intact without lesions or rashes.  Neurologic: Alert and oriented x 3.  Psych:  Normal affect. Extremities: Left foot wrapped.  HEENT: Normal.   Low output HF => not candidate for advanced therapies.  End-stage HF.  Currently stable on milrinone with creatinine back to 2.5 (baseline) and volume status looks ok.  - Continue milrinone 0.25 for now.  - Continue torsemide 100 mg daily.   He is now on heparin gtt for chronic afib.   He has gangrenous left toe, needs L AKA or BKA per vascular surgery.  Not candidate for extensive revascularization.  He is not sure that he wants B/AKA.   Needs further discussion with surgery and palliative care.  - If he opts against surgery, would stop milrinone and aim for hospice/palliative care.  - If he opts for B/AKA, will continue milrinone through surgery.  Afterwards, would try to titrate off and home with palliative care.   - Regardless of approach, prognosis poor from end stage heart failure.  I would favor a palliative care approach.    We will see again Monday unless called.   Loralie Champagne 02/07/2020 2:15 PM

## 2020-02-07 NOTE — Progress Notes (Signed)
ANTICOAGULATION CONSULT NOTE   Pharmacy Consult for Heparin (Apixaban on hold) Indication: atrial fibrillation  No Known Allergies  Patient Measurements: Heparin Dosing Weight: 73kg  Vital Signs: Temp: 98.5 F (36.9 C) (06/04 0354) Temp Source: Oral (06/04 0354) BP: 105/65 (06/04 0354) Pulse Rate: 104 (06/04 0354)  Labs: Recent Labs    02/04/20 1809 02/05/20 0059 02/05/20 0433 02/05/20 0433 02/05/20 0735 02/05/20 1703 02/06/20 0208 02/06/20 1054 02/07/20 0252  HGB   < >  --  9.7*   < >  --   --  9.6*  --  8.9*  HCT   < >  --  28.5*  --   --   --  27.6*  --  25.8*  PLT   < >  --  369  --   --   --  430*  --  384  APTT  --   --   --    < >  --  130* 67* 74* 58*  HEPARINUNFRC  --   --   --   --   --  >2.20* >2.20*  --  1.76*  CREATININE   < >  --   --   --  3.65*  --  3.17*  --  2.53*  TROPONINIHS  --  13  --   --   --   --   --   --   --    < > = values in this interval not displayed.    Estimated Creatinine Clearance: 25.8 mL/min (A) (by C-G formula based on SCr of 2.53 mg/dL (H)). Assessment: 79 y/o M on apixaban PTA for afib. Holding apixaban and starting heparin in anticipation of surgery.  Hep lvl and aptt not correlating - so adjusting based on aPTT  APTT low this am 58  Cbc low but stable  Goal of Therapy:  Heparin level 0.3-0.7 units/ml aPTT 66-102 seconds Monitor platelets by anticoagulation protocol: Yes   Plan:  Increase heparin to 1150 units/hr Recheck aPTT at [place], PharmD, BCPS, BCCCP Clinical Pharmacist [phone]  Please check AMION for all [place] numbers  02/07/2020 6:23 AM

## 2020-02-07 NOTE — Plan of Care (Signed)
  Problem: Education: Goal: Knowledge of General Education information will improve Description: Including pain rating scale, medication(s)/side effects and non-pharmacologic comfort measures Outcome: Progressing   Problem: Clinical Measurements: Goal: Cardiovascular complication will be avoided Outcome: Progressing   Problem: Nutrition: Goal: Adequate nutrition will be maintained Outcome: Progressing   Problem: Coping: Goal: Level of anxiety will decrease Outcome: Progressing   Problem: Pain Managment: Goal: General experience of comfort will improve Outcome: Progressing   Problem: Safety: Goal: Ability to remain free from injury will improve Outcome: Progressing   Problem: Skin Integrity: Goal: Risk for impaired skin integrity will decrease Outcome: Progressing

## 2020-02-07 NOTE — Progress Notes (Signed)
[place] for Heparin (Apixaban on hold) Indication: atrial fibrillation  No Known Allergies  Patient Measurements: Heparin Dosing Weight: 73kg  Vital Signs: Temp: 98.4 F (36.9 C) (06/04 2008) Temp Source: Oral (06/04 2008) BP: 94/66 (06/04 2028) Pulse Rate: 94 (06/04 2008)  Labs: Recent Labs    02/05/20 0059 02/05/20 0433 02/05/20 0433 02/05/20 0735 02/05/20 1703 02/06/20 0208 02/06/20 1054 02/07/20 0252 02/07/20 1142 02/07/20 2111  HGB  --  9.7*   < >  --   --  9.6*  --  8.9*  --   --   HCT  --  28.5*  --   --   --  27.6*  --  25.8*  --   --   PLT  --  369  --   --   --  430*  --  384  --   --   APTT  --   --    < >  --  130* 67*   < > 58* 120* 79*  HEPARINUNFRC  --   --   --   --  >2.20* >2.20*  --  1.76*  --   --   CREATININE  --   --   --  3.65*  --  3.17*  --  2.53*  --   --   TROPONINIHS 13  --   --   --   --   --   --   --   --   --    < > = values in this interval not displayed.    Estimated Creatinine Clearance: 25.8 mL/min (A) (by C-G formula based on SCr of 2.53 mg/dL (H)). Assessment: 79 y/o M on apixaban PTA for afib. Holding apixaban and starting heparin in anticipation of surgery.  Hep lvl and aptt not correlating - so adjusting based on aPTT  Repeat aPTT tonight therapeutic at 79 seconds.  Goal of Therapy:  Heparin level 0.3-0.7 units/ml aPTT 66-102 seconds Monitor platelets by anticoagulation protocol: Yes   Plan:  -Continue heparin 1050 units/h -Daily aPTT and heparin level   Arrie Senate, PharmD, BCPS Clinical Pharmacist [phone] Please check AMION for all [place] numbers 02/07/2020

## 2020-02-07 NOTE — Progress Notes (Signed)
ANTICOAGULATION CONSULT NOTE   Pharmacy Consult for Heparin (Apixaban on hold) Indication: atrial fibrillation  No Known Allergies  Patient Measurements: Heparin Dosing Weight: 73kg  Vital Signs: Temp: 98.1 F (36.7 C) (06/04 0839) Temp Source: Oral (06/04 0839) BP: 94/58 (06/04 0839) Pulse Rate: 96 (06/04 0839)  Labs: Recent Labs    02/04/20 1809 02/05/20 0059 02/05/20 0433 02/05/20 0433 02/05/20 0735 02/05/20 1703 02/06/20 0208 02/06/20 1054 02/07/20 0252  HGB   < >  --  9.7*   < >  --   --  9.6*  --  8.9*  HCT   < >  --  28.5*  --   --   --  27.6*  --  25.8*  PLT   < >  --  369  --   --   --  430*  --  384  APTT  --   --   --    < >  --  130* 67* 74* 58*  HEPARINUNFRC  --   --   --   --   --  >2.20* >2.20*  --  1.76*  CREATININE   < >  --   --   --  3.65*  --  3.17*  --  2.53*  TROPONINIHS  --  13  --   --   --   --   --   --   --    < > = values in this interval not displayed.    Estimated Creatinine Clearance: 25.8 mL/min (A) (by C-G formula based on SCr of 2.53 mg/dL (H)). Assessment: 79 y/o M on apixaban PTA for afib. Holding apixaban and starting heparin in anticipation of surgery.  Hep lvl and aptt not correlating - so adjusting based on aPTT  Aptt above goal after rate change this am, will reduce back to 1050 units/hr.  Hgb trending down to 8.9, plt ok. No bleeding noted.   Goal of Therapy:  Heparin level 0.3-0.7 units/ml aPTT 66-102 seconds Monitor platelets by anticoagulation protocol: Yes   Plan:  Decrease heparin to 1050 units/hr Recheck aPTT at 2100  Erin Hearing PharmD., BCPS Clinical Pharmacist 02/07/2020 10:46 AM   Please check AMION for all [place] numbers

## 2020-02-07 NOTE — Patient Outreach (Signed)
[facility]) Care Management  02/07/2020  ABRON NEDDO 1941-06-17 [id number]   Care coordination  [facility] complex care management   Referral Received: 11/22/19 Initial outreach : 11/22/19 Referral Reason : Notification of discharge of inpatient discharge from [place] : [facility] Admission  5/19-5/27/21 Per MD note pt :  Admitted for heart failure optimization prior to scheduled vascular surgery, Due to  acute/chronic systolic heart failure + cardiogenic shock felt patient high risk for surgery given option   patient elected to cancel surgery,   Noted Patient has been readmitted to [facility] on 02/04/20, Dx: Critical limb ischemia, necrotic toe.   Plan Communicated with [place] via in basket message. Will follow for disposition plans    Joylene Draft, RN, BSN  [place] Management Coordinator  [phone]- Mobile [phone]- [place]

## 2020-02-08 DIAGNOSIS — Z66 Do not resuscitate: Secondary | ICD-10-CM

## 2020-02-08 DIAGNOSIS — N179 Acute kidney failure, unspecified: Secondary | ICD-10-CM

## 2020-02-08 LAB — CBC
HCT: 26.1 % — ABNORMAL LOW (ref 39.0–52.0)
Hemoglobin: 9.1 g/dL — ABNORMAL LOW (ref 13.0–17.0)
MCH: 25.1 pg — ABNORMAL LOW (ref 26.0–34.0)
MCHC: 34.9 g/dL (ref 30.0–36.0)
MCV: 71.9 fL — ABNORMAL LOW (ref 80.0–100.0)
Platelets: 409 10*3/uL — ABNORMAL HIGH (ref 150–400)
RBC: 3.63 MIL/uL — ABNORMAL LOW (ref 4.22–5.81)
RDW: 18.7 % — ABNORMAL HIGH (ref 11.5–15.5)
WBC: 8.2 10*3/uL (ref 4.0–10.5)
nRBC: 0.2 % (ref 0.0–0.2)

## 2020-02-08 LAB — BASIC METABOLIC PANEL
Anion gap: 10 (ref 5–15)
BUN: 43 mg/dL — ABNORMAL HIGH (ref 8–23)
CO2: 24 mmol/L (ref 22–32)
Calcium: 8.1 mg/dL — ABNORMAL LOW (ref 8.9–10.3)
Chloride: 95 mmol/L — ABNORMAL LOW (ref 98–111)
Creatinine, Ser: 2.18 mg/dL — ABNORMAL HIGH (ref 0.61–1.24)
GFR calc Af Amer: 33 mL/min — ABNORMAL LOW (ref >60)
GFR calc non Af Amer: 28 mL/min — ABNORMAL LOW (ref >60)
Glucose, Bld: 96 mg/dL (ref 70–99)
Potassium: 3.6 mmol/L (ref 3.5–5.1)
Sodium: 129 mmol/L — ABNORMAL LOW (ref 135–145)

## 2020-02-08 LAB — HEPARIN LEVEL (UNFRACTIONATED): Heparin Unfractionated: 1.1 IU/mL — ABNORMAL HIGH (ref 0.30–0.70)

## 2020-02-08 LAB — APTT: aPTT: 82 seconds — ABNORMAL HIGH (ref 24–36)

## 2020-02-08 MED ORDER — POTASSIUM CHLORIDE CRYS ER 20 MEQ PO TBCR
20.0000 meq | EXTENDED_RELEASE_TABLET | Freq: Once | ORAL | Status: AC
  Administered 2020-02-08: 20 meq via ORAL
  Filled 2020-02-08: qty 1

## 2020-02-08 MED ORDER — PROSIGHT PO TABS: 1.0000 | ORAL_TABLET | Freq: Every day | ORAL | Status: DC

## 2020-02-08 MED ORDER — NEPRO/CARBSTEADY PO LIQD
237.0000 mL | Freq: Two times a day (BID) | ORAL | Status: DC
  Administered 2020-02-08 – 2020-02-10 (×4): 237 mL via ORAL

## 2020-02-08 MED ORDER — HYDROMORPHONE HCL 2 MG PO TABS
1.0000 mg | ORAL_TABLET | ORAL | Status: DC | PRN
  Administered 2020-02-08: 2 mg via ORAL
  Administered 2020-02-08: 1 mg via ORAL
  Administered 2020-02-09: 2 mg via ORAL
  Filled 2020-02-08 (×4): qty 1

## 2020-02-08 NOTE — Plan of Care (Signed)

## 2020-02-08 NOTE — Progress Notes (Signed)
Patient and family member came for meeting with palliative PA regarding making the goal of care. Dr. Posey Pronto informed that patient and family member had decided to be comfort care at home. However, continue to give medications while patient is in the hospital. Patient may have unlimited visitors due to comfort care. Informed family members that three persons at a time. They understood it well. Controled pain on Lt. 2nd toe with pain medications. [employer]

## 2020-02-08 NOTE — Progress Notes (Signed)
Daily Progress Note   Patient Name: Logan White       Date: 02/08/2020 DOB: 1941/04/02  Age: 79 y.o. MRN#: [id number] Attending Physician: Lavina Hamman, MD Primary Care Physician: Patient, No Pcp Per Admit Date: 02/04/2020  Reason for Consultation/Follow-up: To discuss complex medical decision making related to patient's goals of care  Subjective: Dr. Posey Pronto and I met with patient, wife, 2 brothers, 2 sisters, and grandson - also two other family members were added on by phone.   Had detailed discussion about PMH - cardiac arrest x 2, biventricular heart failure, kidney failure, peripheral vascular disease, poor nutritional status.   Then discussed potential surgery - best possible outcome and worst case scenario.   Family asked multiple questions - these were answered to the best of our ability.  Medical recommendation was given for avoiding surgery and going home with hospice to enjoy family for what time is left.   Family agreed.  Patient still reluctant but made the decision to go home and spend time with family avoiding surgery.  Pain control and code status discussed.  Family and patient agree to focus on comfort as the priority.  If he arrests we will not resuscitate.  However we will continue to active support him with antibiotics, milrinone, etc... until he discharges home with hospice.  Assessment: 77 yom biventricular HF, CKD 4, PVD with ischemic toes.   Patient Profile/HPI:  Logan White is a 79 year old male with past medical history of combined chronic systolic/diastolic heart failure, biventricular failure secondary to ischemic cardiomyopathy, CAD status post prior PCI of LAD and LCx, remote history of VF arrest x2, permanent A. fib on chronic anticoagulation, CKD, severe lower  extremity peripheral artery disease with known right external iliac occlusion and severe infrainguinal arterial occlusive disease with peroneal runoff only, L SFA occlusion with diffuse infrainguinal arterial occlusive disease who was recently scheduled for bypass surgery with vascular surgery on 5/21.  During that admission, goal was to maximize his cardiac status with assistance of heart failure team.  He worsened with cardiogenic shock but this improved with milrinone.  He was to a point where he had likely reached his medical maximization for consideration for surgery but declined to have surgical intervention after further discussion of risk versus benefit with vascular surgery and heart failure team.  He was transitioned off of milrinone and discharged home on 5/27.  After getting home, he developed worsening pain and presented back to the hospital.  His second toe had worsened with further ischemia and ulceration and is currently on IV antibiotics.  There is consideration for plan moving forward with either surgical intervention (likely amputation) versus consideration for transitioning to comfort care approach and possible election of hospice benefits.  Palliative consulted for goals of care.    Length of Stay: 3   Vital Signs: BP 106/61 (BP Location: Right Arm)   Pulse 88   Temp 98 F (36.7 C) (Oral)   Resp 18   Ht _0  (1.905 m)   Wt 73.1 kg   SpO2 94%   BMI 20.14 kg/m  SpO2: SpO2: 94 % O2 Device: O2 Device: Room Air O2 Flow Rate:         Palliative Assessment/Data: 30%     Palliative Care Plan    Recommendations/Plan:  TOC order placed for home with hospice.  Will need bed in home.  Will need Hospice wound care.  Wife will need full support.  Code status changed to DNR.  Please continue milrinone until discharge  Please continue antibiotic therapy as appropriate (even oral therapy on DC if appropriate)  Will increase oral dilaudid to 1 - 2 mg.  Anticipate DC home  as soon as bed is in place - most likely Monday.  Code Status:  DNR  Prognosis:   < 3 months.  Most likely weeks given end-stage HF, CKD4, necrotic toes and need for significant pain control   Discharge Planning:  Home with Hospice  Care plan was discussed with [facility] attending MD, Family, patient and RN.  Thank you for allowing the Palliative Medicine Team to assist in the care of this patient.  Total time spent:  60 min.     Greater than 50%  of this time was spent counseling and coordinating care related to the above assessment and plan.  Florentina Jenny, PA-C Palliative Medicine  Please contact Palliative MedicineTeam phone at [phone] for questions and concerns between 7 am - 7 pm.   Please see AMION for individual provider pager numbers.

## 2020-02-08 NOTE — Progress Notes (Signed)
Pharmacy Antibiotic Note  Logan White is a 79 y.o. male admitted on 02/04/2020 with weakness/wound infection.  Pharmacy has been consulted for Vancomycin/Zosyn dosing. Progressive renal dysfunction. WBC elevated.   Plan: Zosyn 3.375g IV q 8 hrs. Continue for now - could we d/c given palliative care plan?  Temp (24hrs), Avg:98.2 F (36.8 C), Min:97.7 F (36.5 C), Max:98.9 F (37.2 C)  Recent Labs  Lab 02/04/20 1809 02/05/20 0406 02/05/20 0433 02/05/20 0735 02/06/20 0208 02/07/20 0252 02/08/20 0228  WBC 15.5*  --  13.5*  --  11.1* 9.5 8.2  CREATININE 4.15*  --   --  3.65* 3.17* 2.53* 2.18*  LATICACIDVEN  --  1.6  --   --   --   --   --     Estimated Creatinine Clearance: 29.8 mL/min (A) (by C-G formula based on SCr of 2.18 mg/dL (H)).    No Known Allergies  [place] Crane, Roylene Reason, BCCP Clinical Pharmacist  02/08/2020 3:14 PM   [facility] pharmacy phone numbers are listed on [email or web address]

## 2020-02-08 NOTE — Progress Notes (Signed)
Gangrene toes left foot Intermittent left foot pain Apparently family meeting for goals of care today Left leg not salvageable options include pain control or amp Dr Trula Slade will see again next week  Ruta Hinds, MD [facility] Office: [phone]

## 2020-02-08 NOTE — Progress Notes (Signed)
[facility] Progress Note  Patient: Logan White    PJK:[id number]  DOA: 02/04/2020     Date of Service: the patient was seen and examined on 02/08/2020  Chief Complaint  Patient presents with  . Weakness   Brief hospital course: Past medical history of combined CHF, CAD, V. fib arrest x2, A. fib on Eliquis, chronic kidney disease stage IV, hypertension, CVA.  Presented with progressive PVD and wound infection as well as ongoing acute on chronic CHF.  Cardiology palliative care and vascular surgery consulted. Currently further plan is continue to biotics and continue to engage with goals of care.  Assessment and Plan: Infected necrotic left second toe with signs of cellulitis/severe sepsis in the setting of known severe PAD: X-ray of left foot without evidence of osteomyelitis. Has known history of severe PAD for which patient had decided not to undergo surgery during his recent hospitalization given complicated clinical course/decompensated CHF with cardiogenic shock. Vascular surgery consulted. Per surgery patient is not a candidate for extensive revascularization. Will need left AKA or BKA however not willing to proceed at this time. Patient is looking forward to to amputation which will not heal well. Prognosis for surgery regardless of the level of amputation given his heart failure. MRSA PCR negative production continue with IV Zosyn.  Severe PAD/critical lower limb ischemia:  Vascular surgery been consulted for Eliquis changed to heparin. Currently awaiting clearance from cardiology and clarity from goals of care with palliative care consultation.  Chronic combined systolic and diastolic CHF/biventricular failure secondary to ischemic cardiomyopathy:  Last echo done January 2021 with EF 25 to 30%.  Heart failure services consulted. Currently receiving Primacor for optimization prior to consideration for surgery. Appreciate assistance.  Chronic hyponatremia: Likely  related to advanced heart failure.  Sodium 125 but stable compared to labs done during recent hospitalization. Monitor.  Mild hyperkalemia: Take spironolactone and potassium supplement at home.  Potassium 5.5.  EKG without acute changes. -Hold spironolactone and potassium supplement.   Lokelma given  AKI on CKD stage III-IV:  Cardiorenal hemodynamics. Improving with much Primacor. Monitor.  Permanent atrial fibrillation: Currently rate controlled. -Hold Eliquis and start IV heparin in anticipation of surgery for critical limb ischemia.  Hold beta-blocker given hypotension.  Hypertension -Hold antihypertensives given hypotension  Diet: Cardiac diet DVT Prophylaxis: Therapeutic Anticoagulation with Heparin   Advance goals of care discussion: DNR/DNI.  Plan to go home with hospice. Extensive discussion with family at bedside as well as with the patient regarding prognosis and outcomes.  Appreciate palliative care consultation.  Family Communication: Family at bedside.  Discussed in detail.  All questions answered.  Disposition:  Status is: Inpatient  Remains inpatient appropriate because:IV treatments appropriate due to intensity of illness or inability to take PO   Dispo: The patient is from: Home              Anticipated d/c is to: Home with hospice              Anticipated d/c date is: Monday              Patient currently is not medically stable to d/c.   Subjective: Pain still present.  No nausea no vomiting.  No fever no chills.  Physical Exam: General:  alert oriented to time, place, and person.  Appear in mild distress, affect appropriate Eyes: PERRL ENT: Oral Mucosa Clear, moist  Neck: difficult to assess  JVD,  Cardiovascular: S1 and S2 Present, no Murmur,  Respiratory: increased  respiratory effort, Bilateral Air entry equal and Decreased, bilateral  Crackles, no wheezes Abdomen: Bowel Sound present, Soft and no tenderness,  Skin: no rash Extremities:  trace Pedal edema, no calf tenderness Neurologic: without any new focal findings Gait not checked due to patient safety concerns  Vitals:   02/08/20 1355 02/08/20 1500 02/08/20 1629 02/08/20 1700  BP:   (!) 94/58   Pulse:      Resp: 14 (!) 23 17 (!) 26  Temp:   97.7 F (36.5 C)   TempSrc:   Oral   SpO2:   97%   Weight:      Height:        Intake/Output Summary (Last 24 hours) at 02/08/2020 1943 Last data filed at 02/08/2020 1600 Gross per 24 hour  Intake 736.89 ml  Output 1350 ml  Net -613.11 ml   Filed Weights   02/06/20 0500 02/07/20 0358 02/08/20 0344  Weight: 74.6 kg 73.3 kg 73.1 kg    Data Reviewed: I have personally reviewed and interpreted daily labs, tele strips, imagings as discussed above. I reviewed all nursing notes, pharmacy notes, vitals, pertinent old records I have discussed plan of care as described above with RN and patient/family.  CBC: Recent Labs  Lab 02/04/20 1809 02/05/20 0433 02/06/20 0208 02/07/20 0252 02/08/20 0228  WBC 15.5* 13.5* 11.1* 9.5 8.2  HGB 10.1* 9.7* 9.6* 8.9* 9.1*  HCT 30.3* 28.5* 27.6* 25.8* 26.1*  MCV 75.8* 74.2* 72.8* 72.3* 71.9*  PLT 475* 369 430* 384 768*   Basic Metabolic Panel: Recent Labs  Lab 02/04/20 1809 02/05/20 0735 02/06/20 0208 02/07/20 0252 02/08/20 0228  NA 125* 126* 128* 131* 129*  K 5.5* 5.5* 4.6 4.2 3.6  CL 93* 94* 94* 95* 95*  CO2 22 19* 24 23 24   GLUCOSE 125* 90 125* 145* 96  BUN 56* 59* 57* 50* 43*  CREATININE 4.15* 3.65* 3.17* 2.53* 2.18*  CALCIUM 8.5* 8.6* 8.6* 8.2* 8.1*  PHOS  --  3.4  --   --   --     Studies: No results found.  Scheduled Meds: . acetaminophen  1,000 mg Oral TID  . feeding supplement (NEPRO CARB STEADY)  237 mL Oral BID BM  . ferrous sulfate  325 mg Oral BID  . gabapentin  100 mg Oral QHS  . lidocaine  1 patch Transdermal Q24H  . mometasone-formoterol  2 puff Inhalation BID  . pantoprazole  40 mg Oral BID  . sodium chloride flush  3 mL Intravenous Q12H  .  torsemide  100 mg Oral Daily   Continuous Infusions: . sodium chloride    . heparin 1,050 Units/hr (02/08/20 1600)  . milrinone 0.25 mcg/kg/min (02/08/20 1600)  . piperacillin-tazobactam (ZOSYN)  IV 3.375 g (02/08/20 1349)   PRN Meds: sodium chloride, acetaminophen, fentaNYL (SUBLIMAZE) injection, HYDROmorphone, sodium chloride flush  Time spent: 35 minutes  Author: Berle Mull, MD [facility] 02/08/2020 7:43 PM  To reach On-call, see care teams to locate the attending and reach out to them via [email or web address]. If 7PM-7AM, please contact night-coverage If you still have difficulty reaching the attending provider, please page the [facility] (Director on Call) for [facility] on amion for assistance.

## 2020-02-08 NOTE — Progress Notes (Signed)
Initial Nutrition Assessment  DOCUMENTATION CODES:   Not applicable  INTERVENTION:   Nepro Shake po BID, each supplement provides 425 kcal and 19 grams protein  Ocuvite daily for wound healing (provides zinc, vitamin A, vitamin C, Vitamin E, copper, and selenium)  Liberalize diet   NUTRITION DIAGNOSIS:   Increased nutrient needs related to wound healing as evidenced by increased estimated needs.  GOAL:   Patient will meet greater than or equal to 90% of their needs  MONITOR:   PO intake, Supplement acceptance, Labs, Weight trends, Skin, I & O's  REASON FOR ASSESSMENT:   Malnutrition Screening Tool   ASSESSMENT:   79 y.o. 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 secondary to ischemic cardiomyopathy with EF 25 to 30%, CAD status post prior PCI, remote history of VF arrest x2 in 2013 in the setting of hypokalemia and again in 2014 in the setting of MI, no ICD, history of permanent atrial fibrillation on apixaban, CKD stage III-IV, hypertension, CVA, severe lower extremity PAD with known right external iliac artery occlusion with severe infrainguinal arterial occlusive disease who is admitted with complaints of generalized weakness and left foot pain.  RD working remotely.  Unable to reach patient via phone. Per chart review, pt with fair appetite and oral intake at baseline. Pt documented to be eating 50-80% of meals in hospital. RD will add supplements and Ocuvite to help pt meet his estimated needs and to support wound healing. RD will also liberalize pt's diet as a renal diet is restrictive and pt is not eating enough to exceed nutrient limits. Per chart, pt appears fairly weight stable over the past 6 months; however, this is difficult to truly determine as pt has had multiple admits with CHF and weight gain. Plan is for family meeting today to discuss [place].    Pt is at high risk for malnutrition but unable to  diagnose at this time as NFPE cannot be performed.   Medications reviewed and include: ferrous sulfate, protonix, torsemide, heparin, zosyn   Labs reviewed: Na 129(L), BUN 43(H), creat 2.18(H) P 3.4 wnl, Mg 2.2(L)- 6/2 Hgb 9.1(L), Hct 26.1(L), MCV 71.9(L), MCH 25.1(L)  NUTRITION - FOCUSED PHYSICAL EXAM: Unable to perform at this time   Diet Order:   Diet Order            Diet renal with fluid restriction Fluid restriction: 1200 mL Fluid; Room service appropriate? Yes; Fluid consistency: Thin  Diet effective now             EDUCATION NEEDS:   Not appropriate for education at this time  Skin:  Skin Assessment: Reviewed RN Assessment(wounds toes and feet)  Last BM:  6/4- type 3  Height:   Ht Readings from Last 1 Encounters:  02/07/20 6\' 3"  (1.905 m)    Weight:   Wt Readings from Last 1 Encounters:  02/08/20 73.1 kg    Ideal Body Weight:  89 kg  BMI:  Body mass index is 20.14 kg/m.  Estimated Nutritional Needs:   Kcal:  2000-2300kcal/day  Protein:  95-110g/day  Fluid:  1.8L/day  Koleen Distance MS, RD, LDN Please refer to [facility] for RD and/or RD on-call/weekend/after hours pager

## 2020-02-09 LAB — HEPARIN LEVEL (UNFRACTIONATED): Heparin Unfractionated: 0.69 IU/mL (ref 0.30–0.70)

## 2020-02-09 LAB — BASIC METABOLIC PANEL
Anion gap: 13 (ref 5–15)
BUN: 36 mg/dL — ABNORMAL HIGH (ref 8–23)
CO2: 25 mmol/L (ref 22–32)
Calcium: 8.6 mg/dL — ABNORMAL LOW (ref 8.9–10.3)
Chloride: 95 mmol/L — ABNORMAL LOW (ref 98–111)
Creatinine, Ser: 1.85 mg/dL — ABNORMAL HIGH (ref 0.61–1.24)
GFR calc Af Amer: 40 mL/min — ABNORMAL LOW (ref >60)
GFR calc non Af Amer: 35 mL/min — ABNORMAL LOW (ref >60)
Glucose, Bld: 127 mg/dL — ABNORMAL HIGH (ref 70–99)
Potassium: 3.4 mmol/L — ABNORMAL LOW (ref 3.5–5.1)
Sodium: 133 mmol/L — ABNORMAL LOW (ref 135–145)

## 2020-02-09 LAB — CBC
HCT: 27.6 % — ABNORMAL LOW (ref 39.0–52.0)
Hemoglobin: 9.5 g/dL — ABNORMAL LOW (ref 13.0–17.0)
MCH: 25.1 pg — ABNORMAL LOW (ref 26.0–34.0)
MCHC: 34.4 g/dL (ref 30.0–36.0)
MCV: 73 fL — ABNORMAL LOW (ref 80.0–100.0)
Platelets: 379 10*3/uL (ref 150–400)
RBC: 3.78 MIL/uL — ABNORMAL LOW (ref 4.22–5.81)
RDW: 19.2 % — ABNORMAL HIGH (ref 11.5–15.5)
WBC: 7.9 10*3/uL (ref 4.0–10.5)
nRBC: 0 % (ref 0.0–0.2)

## 2020-02-09 LAB — APTT: aPTT: 122 seconds — ABNORMAL HIGH (ref 24–36)

## 2020-02-09 MED ORDER — FENTANYL CITRATE (PF) 100 MCG/2ML IJ SOLN: 12.5000 ug | INTRAMUSCULAR | Status: DC | PRN

## 2020-02-09 MED ORDER — POTASSIUM CHLORIDE CRYS ER 20 MEQ PO TBCR
40.0000 meq | EXTENDED_RELEASE_TABLET | Freq: Once | ORAL | Status: AC
  Administered 2020-02-09: 40 meq via ORAL
  Filled 2020-02-09: qty 2

## 2020-02-09 MED ORDER — APIXABAN 5 MG PO TABS
5.0000 mg | ORAL_TABLET | Freq: Two times a day (BID) | ORAL | Status: DC
  Administered 2020-02-09 – 2020-02-10 (×3): 5 mg via ORAL
  Filled 2020-02-09 (×3): qty 1

## 2020-02-09 MED ORDER — OXYCODONE HCL ER 10 MG PO T12A
10.0000 mg | EXTENDED_RELEASE_TABLET | Freq: Two times a day (BID) | ORAL | Status: DC
  Administered 2020-02-09 – 2020-02-10 (×3): 10 mg via ORAL
  Filled 2020-02-09 (×3): qty 1

## 2020-02-09 MED ORDER — SENNOSIDES-DOCUSATE SODIUM 8.6-50 MG PO TABS: 1.0000 | ORAL_TABLET | Freq: Two times a day (BID) | ORAL | Status: DC | PRN

## 2020-02-09 NOTE — TOC Initial Note (Signed)
Transition of Care [facility]) - Initial/Assessment Note    Patient Details  Name: Logan White MRN: [id number] Date of Birth: December 03, 1940  [facility]) CM/SW Contact:    Carles Collet, RN Phone Number: 02/09/2020, 9:06 AM  Clinical Narrative:       Damaris Schooner w wife. We discussed potential need for residential Hospice eventually, and which would be closest. This guided her decision to choose [facility].  Referral placed to [facility], accepted. Will need hospital bed, has RW, shower chair, and O2 ([facility]) at home. DME will be ordered by hospice. Patient will need PTAR transport home. Please have signed Gold DNR on chart.             Expected Discharge Plan: Home w Hospice Care Barriers to Discharge: Continued Medical Work up   Patient Goals and CMS Choice Patient states their goals for this hospitalization and ongoing recovery are:: wife- go home w hospice CMS Medicare.gov Compare Post Acute Care list provided to:: Other (Comment Required) Choice offered to / list presented to : Spouse  Expected Discharge Plan and Services Expected Discharge Plan: [place]   Discharge Planning Services: CM Consult   Living arrangements for the past 2 months: [place] Agency: Hospice and [place] Date [facility] Agency Contacted: 02/09/20 Time HH Agency Contacted: [id number] Representative spoke with at [place]: Latanya Presser  Prior Living Arrangements/Services Living arrangements for the past 2 months: [place] with:: Siblings                   Activities of Daily Living      Permission Sought/Granted                  Emotional Assessment              Admission diagnosis:  Critical lower limb ischemia [I99.8] AKI (acute kidney injury) ([place]) [N17.9] Necrotic toes ([place]) [I96] Sepsis ([place]) [A41.9] Patient Active Problem List   Diagnosis Date Noted  . DNR (do not resuscitate)   .  AKI (acute kidney injury) ([place])   . Palliative care encounter   . Necrosis of toe ([place]) 02/05/2020  . Cellulitis 02/05/2020  . Critical lower limb ischemia 02/05/2020  . Severe sepsis ([place]) 02/05/2020  . Acute on chronic systolic CHF (congestive heart failure), NYHA class 3 ([place]) 01/22/2020  . Acute on chronic combined systolic and diastolic CHF (congestive heart failure) ([place]) 11/25/2019  . Palliative care by specialist   . Goals of care, counseling/discussion   . PVD (peripheral vascular disease) ([place]) 11/15/2019  . Gastritis and gastroduodenitis   . Benign neoplasm of transverse colon   . Microcytic anemia   . Acute blood loss anemia   . Melena   . CHF (congestive heart failure) ([place]) 11/04/2019  . CHF (congestive heart failure), NYHA class IV, acute, systolic ([place]) 46/27/0350  . Persistent atrial fibrillation ([place])   . Acute on chronic systolic CHF (congestive heart failure) ([place]) 06/22/2019  . Acute on chronic systolic (congestive) heart failure ([place]) 06/22/2019  . CAD S/P PCI 10/13/2015  . Cardiomyopathy, new. Etiology apeears to be NICM 10/13/2015  . AAA (abdominal aortic aneurysm) ([place])   . Peripheral vascular disease ([place])   . Carotid artery stenosis   . Hypertensive heart disease   . Bruit  10/24/2014  . Acute on chronic renal failure ([place]) 01/11/2013  . Acute MI, anterolateral wall, initial episode of care ([place]) 12/31/2012  . Cardiac arrest due to underlying cardiac condition ([place]) 12/31/2012  . VF (ventricular fibrillation) ([place]) 12/31/2012  . Abnormal LFTs 12/31/2012  . Acute on chronic renal insufficiency 12/31/2012  . Chronic anticoagulation 12/31/2012  . Hypokalemia 12/31/2012  . Weakness 04/23/2012  . Long term (current) use of anticoagulants 03/19/2012  . CKD (chronic kidney disease), stage III 03/16/2012  . Acute confusion/delerium 03/12/2012  . Delirium 03/12/2012  . Acute systolic CHF (congestive heart failure) ([place]) 03/11/2012  . Septic shock(785.52)  02/27/2012    Class: Acute  . Cardiogenic shock ([place]) 02/25/2012  . Acute renal failure ([place]) 02/25/2012  . Anoxic encephalopathy ([place]) 02/25/2012  . Ischemic cardiomyopathy 02/25/2012  . Cardiac arrest ([place]) 02/23/2012  . Acute respiratory failure with hypoxia ([place]) 02/23/2012  . Ventricular fibrillation ([place]) 02/23/2012  . DM (diabetes mellitus) ([place]) 02/23/2012  . Screening for colon cancer 02/10/2011  . Hematemesis 01/13/2011  . Dyspnea 01/13/2011  . RENAL INSUFFICIENCY 08/26/2010  . HYPERPOTASSEMIA 09/15/2009  . TOBACCO ABUSE 08/11/2009  . GLUCOSE INTOLERANCE 10/02/2008  . Hyperlipidemia 10/02/2008  . Essential hypertension 10/02/2008  . Cerebrovascular disease 10/02/2008  . PERIPHERAL VASCULAR DISEASE 10/02/2008  . INGUINAL HERNIA 10/02/2008   PCP:  Patient, No Pcp Per Pharmacy:   [facility], [place] [address] Phone: [phone] Fax: [phone]  [id number] - [place], [place] [address] Phone: [phone] Fax: [phone]  [facility] [address] [place], [place] [address] Phone: [phone] Fax: [phone]     Social Determinants of Health (SDOH) Interventions    Readmission Risk Interventions Readmission Risk Prevention Plan 01/30/2020 01/24/2020 12/04/2019  Transportation Screening Complete Complete Complete  PCP or Specialist Appt within 3-5 Days - - -  HRI or [place] for [place] - - -  Medication Review Press photographer) Complete Complete Complete  PCP or Specialist appointment within 3-5 days of discharge - Complete Complete  HRI or [place] Complete Complete Complete  SW Recovery Care/Counseling Consult Complete Complete Complete  Palliative Care Screening  Not Applicable Not Applicable Not [place] Not Applicable Complete Not Applicable  Some recent data might be hidden

## 2020-02-09 NOTE — Plan of Care (Signed)
  Problem: Education: Goal: Knowledge of General Education information will improve Description: Including pain rating scale, medication(s)/side effects and non-pharmacologic comfort measures Outcome: Progressing   Problem: Clinical Measurements: Goal: Cardiovascular complication will be avoided Outcome: Progressing   Problem: Nutrition: Goal: Adequate nutrition will be maintained Outcome: Progressing   Problem: Coping: Goal: Level of anxiety will decrease Outcome: Progressing   Problem: Pain Managment: Goal: General experience of comfort will improve Outcome: Progressing   Problem: Skin Integrity: Goal: Risk for impaired skin integrity will decrease Outcome: Progressing

## 2020-02-09 NOTE — Progress Notes (Signed)
Daily Progress Note   Patient Name: Logan White       Date: 02/09/2020 DOB: 20-Jul-1941  Age: 79 y.o. MRN#: [id number] Attending Physician: Lavina Hamman, MD Primary Care Physician: Logan White Admit Date: 02/04/2020  Reason for Consultation/Follow-up: symptom management   Subjective: Patient looks comfortable at 4:30 pm this afternoon.  G'dtr at bedside very pleased with how her grandfather is doing.  Family anticipating him coming home tomorrow via ambulance.  Patient reports he does have some pain spasms in his leg but they are much better controlled.  He is having bowel movements, eating, resting at night.   Assessment: 33 yom ES biventricular HF, CKD 4, PVD, necrotic toes.  To go home with hospice tomorrow.  Pain as well controlled as possible.  Patient is alert.   Patient Profile/HPI:  79 year old male with past medical history of combined chronic systolic/diastolic heart failure, biventricular failure secondary to ischemic cardiomyopathy, CAD status post prior PCI of LAD and LCx, remote history of VF arrest x2, permanent A. fib on chronic anticoagulation, CKD, severe lower extremity peripheral artery disease with known right external iliac occlusion and severe infrainguinal arterial occlusive disease with peroneal runoff only, L SFA occlusion with diffuse infrainguinal arterial occlusive disease who was recently scheduled for bypass surgery with vascular surgery on 5/21.  During that admission, goal was to maximize his cardiac status with assistance of heart failure team.  He worsened with cardiogenic shock but this improved with milrinone.  He was to a point where he had likely reached his medical maximization for consideration for surgery but declined to have surgical  intervention after further discussion of risk versus benefit with vascular surgery and heart failure team.  He was transitioned off of milrinone and discharged home on 5/27.  After getting home, he developed worsening pain and presented back to the hospital.  His second toe had worsened with further ischemia and ulceration and is currently on IV antibiotics.  There is consideration for plan moving forward with either surgical intervention (likely amputation) versus consideration for transitioning to comfort care approach and possible election of hospice benefits.    Length of Stay: 4   Vital Signs: BP (!) 88/63 (BP Location: Right Arm)   Pulse 70   Temp 98 F (36.7 C) (Oral)   Resp 18  Ht 6\' 3"  (1.905 m)   Wt 72.7 kg   SpO2 92%   BMI 20.03 kg/m  SpO2: SpO2: 92 % O2 Device: O2 Device: Room Air O2 Flow Rate:         Palliative Assessment/Data: 20%     Palliative Care Plan    Recommendations/Plan:  Home with hospice.   Would continue anticoagulation, diuretics, protonix, oral antibiotics (if appropriate) to maintain comfort as long as possible.  For pain control would recommend continuing as is - Oxycontin 10 mg q 12 hours with dilaudid 2 mg PO q 4 hours PRN breakthru pain.  Continue lidocaine patches.  Code Status:  DNR - Needs golden DNR form  Prognosis:   < 3 months   Discharge Planning:  Home with Hospice  Care plan was discussed with patient and grand daughter  Thank you for allowing the Palliative Medicine Team to assist in the care of this patient.  Total time spent:   35 min.     Greater than 50%  of this time was spent counseling and coordinating care related to the above assessment and plan.  Florentina Jenny, PA-C Palliative Medicine  Please contact Palliative MedicineTeam phone at [phone] for questions and concerns between 7 am - 7 pm.   Please see AMION for individual provider pager numbers.

## 2020-02-09 NOTE — Progress Notes (Signed)
[facility] Progress Note  Patient: Logan White    ZOX:[id number]  DOA: 02/04/2020     Date of Service: the patient was seen and examined on 02/09/2020  Chief Complaint  Patient presents with  . Weakness   Brief hospital course: Past medical history of combined CHF, CAD, V. fib arrest x2, A. fib on Eliquis, chronic kidney disease stage IV, hypertension, CVA.  Presented with progressive PVD and wound infection as well as ongoing acute on chronic CHF.  Cardiology palliative care and vascular surgery consulted. Currently further plan is continue current care while awaiting arrangement for going home with hospice  Assessment and Plan: Infected necrotic left second toe  cellulitis/severesepsis POA in the setting of known severe PAD: X-ray of left foot without evidence of osteomyelitis. Has known history of severe PAD for which patient had decided not to undergo surgery during his recent hospitalization given complicated postop course/decompensated CHF with cardiogenic shock. Vascular surgery consulted. Per surgery patient is not a candidate for extensive revascularization. Will need left AKA or BKA however not willing to proceed at this time. Poor prognosis for postop recovery and in general regardless of the level of amputation given his heart failure. MRSA PCR negative  continue with IV Zosyn.  Severe PAD/critical lower limb ischemia:  Vascular surgery been consulted for Eliquis changed to heparin in the discretion of the surgery, will change back to Eliquis.  Chronic combined systolic and diastolic CHF/biventricular failure secondary to ischemic cardiomyopathy: Last echo done January 2021 with EF 25 to 30%.  Heart failure services consulted. Currently receiving Primacor for stabilization prior to discharge with hospice. Appreciate assistance.  Chronic hyponatremia:  related to advanced heart failure. Monitor.  Mild hyperkalemia:  Take spironolactone and potassium  supplement at home.  Potassium 5.5. EKG without acute changes. Hold spironolactone and potassium supplement.  Lokelma given  AKI on CKD stageIII-IV: Cardiorenal hemodynamics. Improving with much Primacor. Worsening renal function without Primacor support heralding poor prognosis.  Permanent atrial fibrillation: Currently rate controlled. Hold beta-blocker given hypotension. Back on Eliquis  Hypertension -Hold antihypertensives given hypotension  Goals of care discussion. Palliative care meeting with family and patient on 02/08/2020. Highly appreciate palliative care assistance in this patient. After understanding patient's overall poor prognosis, very high risk nature of the surgery and more likely poor postoperative outcome family and patient has decided to transition to hospice at home. Case management consulted. Anticipating discharge to home with hospice on Monday. Continue antibiotics and Primacor until discharge to maintain best possible state for the patient Given that the patient has ongoing pain due to PVD I will add OxyContin 10 mg twice daily scheduled.  Diet: Regular diet DVT Prophylaxis: Therapeutic Anticoagulation with Eliquis   Advance goals of care discussion: DNR  Family Communication: no family was present at bedside, at the time of interview.   Disposition:  Status is: Inpatient  Remains inpatient appropriate because:Unsafe d/c plan   Dispo: The patient is from: Home              Anticipated d/c is to: Home              Anticipated d/c date is: 1 day              Patient currently is not medically stable to d/c.  Subjective: Reports pain.  No nausea no vomiting.  No fever no chills.  Physical Exam:  General: Appear in mild distress, no Rash; Oral Mucosa Clear, moist. no Abnormal Neck Mass Or lumps,  Conjunctiva normal  Cardiovascular: S1 and S2 Present, aortic systolic  Murmur, Respiratory: good respiratory effort, Bilateral Air entry  present and bilateral  Crackles, Occasional  wheezes Abdomen: Bowel Sound present, Soft and no tenderness Extremities: no Pedal edema, no calf tenderness Neurology: alert and oriented to time, place, and person affect appropriate. no new focal deficit Gait not checked due to patient safety concerns  Vitals:   02/08/20 2052 02/08/20 2325 02/09/20 0415 02/09/20 0741  BP:  93/62 95/61 (!) 92/59  Pulse: 69  93   Resp: 18 15 18 19   Temp:  98.4 F (36.9 C) 97.6 F (36.4 C) 98.5 F (36.9 C)  TempSrc:  Oral Oral Oral  SpO2: 98%  97% 97%  Weight:   72.7 kg   Height:        Intake/Output Summary (Last 24 hours) at 02/09/2020 0748 Last data filed at 02/09/2020 0630 Gross per 24 hour  Intake 901.91 ml  Output --  Net 901.91 ml   Filed Weights   02/07/20 0358 02/08/20 0344 02/09/20 0415  Weight: 73.3 kg 73.1 kg 72.7 kg    Data Reviewed: I have personally reviewed and interpreted daily labs, tele strips, imagings as discussed above. I reviewed all nursing notes, pharmacy notes, vitals, pertinent old records I have discussed plan of care as described above with RN and patient/family.  CBC: Recent Labs  Lab 02/05/20 0433 02/06/20 0208 02/07/20 0252 02/08/20 0228 02/09/20 0506  WBC 13.5* 11.1* 9.5 8.2 7.9  HGB 9.7* 9.6* 8.9* 9.1* 9.5*  HCT 28.5* 27.6* 25.8* 26.1* 27.6*  MCV 74.2* 72.8* 72.3* 71.9* 73.0*  PLT 369 430* 384 409* 678   Basic Metabolic Panel: Recent Labs  Lab 02/05/20 0735 02/06/20 0208 02/07/20 0252 02/08/20 0228 02/09/20 0506  NA 126* 128* 131* 129* 133*  K 5.5* 4.6 4.2 3.6 3.4*  CL 94* 94* 95* 95* 95*  CO2 19* 24 23 24 25   GLUCOSE 90 125* 145* 96 127*  BUN 59* 57* 50* 43* 36*  CREATININE 3.65* 3.17* 2.53* 2.18* 1.85*  CALCIUM 8.6* 8.6* 8.2* 8.1* 8.6*  PHOS 3.4  --   --   --   --     Studies: No results found.  Scheduled Meds: . acetaminophen  1,000 mg Oral TID  . apixaban  5 mg Oral BID  . feeding supplement (NEPRO CARB STEADY)  237 mL Oral BID  BM  . ferrous sulfate  325 mg Oral BID  . gabapentin  100 mg Oral QHS  . lidocaine  1 patch Transdermal Q24H  . mometasone-formoterol  2 puff Inhalation BID  . pantoprazole  40 mg Oral BID  . sodium chloride flush  3 mL Intravenous Q12H  . torsemide  100 mg Oral Daily   Continuous Infusions: . sodium chloride    . milrinone 0.25 mcg/kg/min (02/08/20 2217)  . piperacillin-tazobactam (ZOSYN)  IV 3.375 g (02/09/20 0634)   PRN Meds: sodium chloride, acetaminophen, fentaNYL (SUBLIMAZE) injection, HYDROmorphone, sodium chloride flush  Time spent: 35 minutes  Author: Berle Mull, MD [facility] 02/09/2020 7:48 AM  To reach On-call, see care teams to locate the attending and reach out via [email or web address]. Between 7PM-7AM, please contact night-coverage If you still have difficulty reaching the attending provider, please page the [facility] (Director on Call) for [facility] on amion for assistance.

## 2020-02-09 NOTE — Progress Notes (Signed)
ANTICOAGULATION CONSULT NOTE   Pharmacy Consult for Heparin > change back to Eliquis Indication: atrial fibrillation  No Known Allergies  Patient Measurements: Heparin Dosing Weight: 73kg  Vital Signs: Temp: 97.6 F (36.4 C) (06/06 0415) Temp Source: Oral (06/06 0415) BP: 95/61 (06/06 0415) Pulse Rate: 93 (06/06 0415)  Labs: Recent Labs    02/07/20 0252 02/07/20 0252 02/07/20 1142 02/07/20 2111 02/08/20 0228 02/09/20 0506 02/09/20 0508  HGB 8.9*   < >  --   --  9.1* 9.5*  --   HCT 25.8*  --   --   --  26.1* 27.6*  --   PLT 384  --   --   --  409* 379  --   APTT 58*  --    < > 79* 82* 122*  --   HEPARINUNFRC 1.76*  --   --   --  1.10*  --  0.69  CREATININE 2.53*  --   --   --  2.18* 1.85*  --    < > = values in this interval not displayed.    Estimated Creatinine Clearance: 34.9 mL/min (A) (by C-G formula based on SCr of 1.85 mg/dL (H)). Assessment: 79 y/o M on apixaban PTA for afib. Holding apixaban and starting heparin in anticipation of surgery.  Heparin level and aptt not correlating; aPTT elevated this morning.  Aptt above goal after rate change this am, will reduce back to 1050 units/hr.  Hgb low but fairly stable, plt ok. No bleeding noted.   Now plan is for home with hospice, no amputations.  Discussed with Dr. Posey Pronto, will switch back to oral anticoagulation in anticipation of discharge.  Goal of Therapy:  Monitor platelets by anticoagulation protocol: Yes   Plan:  Stop IV heparin Resume Eliquis 5 mg BID.   [place] Crane, Roylene Reason, BCCP Clinical Pharmacist  02/09/2020 7:18 AM   [facility] pharmacy phone numbers are listed on [email or web address]

## 2020-02-10 MED ORDER — LORAZEPAM 0.5 MG PO TABS: 0.5000 mg | ORAL_TABLET | Freq: Three times a day (TID) | ORAL | 0 refills | Status: AC | PRN

## 2020-02-10 MED ORDER — OXYCODONE HCL ER 10 MG PO T12A: 10.0000 mg | EXTENDED_RELEASE_TABLET | Freq: Two times a day (BID) | ORAL | 0 refills | Status: AC

## 2020-02-10 MED ORDER — TORSEMIDE 100 MG PO TABS: 100.0000 mg | ORAL_TABLET | Freq: Every day | ORAL | 0 refills | Status: AC

## 2020-02-10 MED ORDER — OXYCODONE-ACETAMINOPHEN 5-325 MG PO TABS: 1.0000 | ORAL_TABLET | ORAL | 0 refills | Status: AC | PRN

## 2020-02-10 NOTE — Progress Notes (Signed)
[facility]) Hospital Liaison: RN note     Notified by Transition of Care Manger of patient/family request for [facility] services at home after discharge. Chart and patient information under review by [facility] physician. Hospice eligibility pending currently.     Writer spoke with wife, Mardene Celeste  to initiate education related to hospice philosophy, services and team approach to care. Mardene Celeste  verbalized understanding of information given. Per discussion, plan is for discharge to home by  PTAR.     Please send signed and completed DNR form home with patient/family. Patient will need prescriptions for discharge comfort medications.      DME needs have been discussed, patient currently has the following equipment in the home: oxygen, walker, shower chair .  Patient/family requests the following DME for delivery to the home: hospital bed. [employer] equipment manager has been notified and will contact DME provider to arrange delivery to the home. Home address has been verified and is correct in the chart.   Mardene Celeste is the family member to contact to arrange time of delivery.      [facility] aware of the above. Please notify [facility] when patient is ready to leave the unit at discharge. (Call [phone] or [phone] after 5pm.) [facility] information and contact numbers given to [facility].       Please call with any hospice related questions.      Thank you for this referral.      Farrel Gordon, RN, [facility] (listed on [place] under [place])   [phone]

## 2020-02-10 NOTE — Discharge Summary (Signed)
[facility] Discharge Summary   Patient: Logan White HQI:[id number]  PCP: Patient, No Pcp Per  Date of admission: 02/04/2020   Date of discharge:  02/10/2020     Discharge Diagnoses:  Principal Problem:   Cellulitis Active Problems:   CHF (congestive heart failure), NYHA class IV, acute, systolic (HCC)   Necrosis of toe (HCC)   Critical lower limb ischemia   Severe sepsis ([place])   AKI (acute kidney injury) ([place])   Palliative care encounter   DNR (do not resuscitate)  Admitted From: Home Disposition: Home with hospice  Recommendations for Outpatient Follow-up:  1. PCP: Establish care with hospice 2. Follow up LABS/TEST: None  Follow-up Information    [facility] Follow up.   Why: home hospice Contact information: [place] [phone]         Diet recommendation: Regular diet  Activity: The patient is advised to gradually reintroduce usual activities, as tolerated  Discharge Condition: stable  Code Status: DNR   History of present illness: As per the H and P dictated on admission, "Logan White is a 79 y.o. 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 secondary to ischemic cardiomyopathy with EF 25 to 30%, CAD status post prior PCI, remote history of VF arrest x2 in 2013 in the setting of hypokalemia and again in 2014 in the setting of MI, no ICD, history of permanent atrial fibrillation on apixaban, CKD stage III-IV, hypertension, CVA, severe lower extremity PAD with known right external iliac artery occlusion with severe infrainguinal arterial occlusive disease and peroneal runoff only and left SFA occlusion with diffuse infrainguinal arterial occlusive disease followed by vascular surgery presenting with complaints of generalized weakness and left foot pain.  Per discharge summary from recent hospitalization, vascular surgery had recommended  surgical revascularization once medically cleared.  Patient was admitted by the cardiology service for heart failure optimization.  He decompensated and developed cardiogenic shock during this hospitalization.  As such, he was felt to be high risk for surgery.  After discussions with cardiology and vascular surgery, patient decided not to undergo surgery at that time.  Patient is a poor historian.  Reports having pain bilateral lower extremities for very long time.  He is not sure how long his left second toe has appeared necrotic.  Denies fevers or chills.  Denies weakness, cough, shortness of breath, chest pain, nausea, vomiting, abdominal pain, or diarrhea.  No additional history could be obtained from him."  Hospital Course:  Summary of his active problems in the hospital is as following. Infected necrotic left second toe  cellulitis/severesepsis POA in the setting of known severe PAD X-ray of left foot without evidence of osteomyelitis. Has known history of severe PAD for which patient had decided not to undergo surgery during his recent hospitalization given complicated postop course/decompensated CHF with cardiogenic shock. Vascular surgery consulted. Per surgery patient is not a candidate for extensive revascularization. Will need left AKA or BKA however not willing to proceed at this time. Poor prognosis for postop recovery and in general regardless of the level of amputation given his heart failure. MRSA PCR negative  Treated with IV Zosyn. No indication to continue Antibiotics on discharge.   Severe PAD/critical lower limb ischemia:  Vascular surgery been consulted for Eliquis changed to heparin in the discretion of the surgery, will change back to Eliquis.  Chronic combined systolic and diastolic CHF/biventricular failure secondary to ischemic cardiomyopathy: Last echo done January  2021 with EF 25 to 30%.  Heart failure services consulted. Currently receiving Primacor for  stabilization prior to discharge with hospice. Appreciate assistance.  Chronic hyponatremia:  related to advanced heart failure. Monitor.  Mild hyperkalemia:  Take spironolactone and potassium supplement at home.  Potassium 5.5. EKG without acute changes. Hold spironolactone and potassium supplement.  Lokelma given  AKI on CKD stageIII-IV: Cardiorenal hemodynamics. Improving with much Primacor. Worsening renal function without Primacor support heralding poor prognosis.  Permanent atrial fibrillation: Currently rate controlled. Hold beta-blocker given hypotension. Back on Eliquis  Hypertension -Hold antihypertensives given hypotension  Goals of care discussion. Palliative care meeting with family and patient on 02/08/2020. Highly appreciate palliative care assistance in this patient. After understanding patient's overall poor prognosis, very high risk nature of the surgery and more likely poor postoperative outcome family and patient has decided to transition to hospice at home. Case management consulted. Home hospice arranged. Continued antibiotics and Primacor until discharge to maintain best possible state for the patient. Given that the patient has ongoing pain due to PVD I will add OxyContin 10 mg twice daily scheduled.  Pain control  - [place] Controlled Substance Reporting System database was reviewed. Patient will be discharged home with hospice - 5 day supply was provided. - Patient was instructed, not to drive, operate heavy machinery, perform activities at heights, swimming or participation in water activities or provide baby sitting services while on Pain, Sleep and Anxiety Medications; until his outpatient Physician has advised to do so again.   On the day of the discharge the patient's vitals were stable, and no other acute medical condition were reported by patient. the patient was felt safe to be discharge at Home with hospice.  Consultants:  Cardiology Palliative care vascular surgery Procedures: Echocardiogram. Primacor infusion  Discharge Exam: General: Appear in mild distress, no Rash; Oral Mucosa Clear, moist. Cardiovascular: S1 and S2 Present, aortic systolic  Murmur, Respiratory: normal respiratory effort, Bilateral Air entry present and bilateral  Crackles, no wheezes Abdomen: Bowel Sound present, Soft and no tenderness, no hernia Extremities: trace Pedal edema, no calf tenderness Neurology: alert and oriented to time, place, and person affect appropriate.  Filed Weights   02/07/20 0358 02/08/20 0344 02/09/20 0415  Weight: 73.3 kg 73.1 kg 72.7 kg   Vitals:   02/10/20 0722 02/10/20 0800  BP: 116/84   Pulse: (!) 114   Resp: 20 20  Temp: (!) 97.4 F (36.3 C)   SpO2: 99%     DISCHARGE MEDICATION: Allergies as of 02/10/2020   No Known Allergies     Medication List    STOP taking these medications   atorvastatin 80 MG tablet Commonly known as: LIPITOR   ferrous sulfate 325 (65 FE) MG tablet   hydrALAZINE 10 MG tablet Commonly known as: APRESOLINE   Klor-Con M20 20 MEQ tablet Generic drug: potassium chloride SA   spironolactone 25 MG tablet Commonly known as: ALDACTONE     TAKE these medications   acetaminophen 325 MG tablet Commonly known as: TYLENOL Take 650 mg by mouth every 6 (six) hours as needed for mild pain.   albuterol 108 (90 Base) MCG/ACT inhaler Commonly known as: VENTOLIN HFA Inhale 2 puffs into the lungs every 6 (six) hours as needed for wheezing or shortness of breath.   Eliquis 5 MG Tabs tablet Generic drug: apixaban TAKE 1 TABLET BY MOUTH TWICE A DAY What changed: how much to take   Ensure Take 237 mLs by mouth daily.  Fluticasone-Salmeterol 250-50 MCG/DOSE Aepb Commonly known as: Advair Diskus Inhale 1 puff into the lungs 2 (two) times daily for 14 days. What changed:   when to take this  reasons to take this   gabapentin 300 MG capsule Commonly known as:  NEURONTIN Take 300 mg by mouth at bedtime.   hydrOXYzine 25 MG capsule Commonly known as: VISTARIL Take 25 mg by mouth every 8 (eight) hours as needed for itching.   isosorbide mononitrate 30 MG 24 hr tablet Commonly known as: IMDUR Take 1 tablet (30 mg total) by mouth daily.   LORazepam 0.5 MG tablet Commonly known as: Ativan Take 1 tablet (0.5 mg total) by mouth every 8 (eight) hours as needed for anxiety.   metoprolol succinate 25 MG 24 hr tablet Commonly known as: TOPROL-XL Take 0.5 tablets (12.5 mg total) by mouth at bedtime. Take with or immediately following a meal.   nitroGLYCERIN 0.4 MG SL tablet Commonly known as: Nitrostat Place 1 tablet (0.4 mg total) under the tongue every 5 (five) minutes as needed for chest pain (Up to 3 doses).   oxyCODONE 10 mg 12 hr tablet Commonly known as: OXYCONTIN Take 1 tablet (10 mg total) by mouth every 12 (twelve) hours.   oxyCODONE-acetaminophen 5-325 MG tablet Commonly known as: Percocet Take 1 tablet by mouth every 4 (four) hours as needed for severe pain.   oxymetazoline 0.05 % nasal spray Commonly known as: AFRIN Place 1 spray into both nostrils 2 (two) times daily as needed for congestion.   pantoprazole 40 MG tablet Commonly known as: PROTONIX Take 1 tablet (40 mg total) by mouth 2 (two) times daily.   SARNA EX Apply 1 application topically daily as needed (itching).   torsemide 100 MG tablet Commonly known as: DEMADEX Take 1 tablet (100 mg total) by mouth daily. What changed: when to take this      No Known Allergies Discharge Instructions    Diet - low sodium heart healthy   Complete by: As directed    Increase activity slowly   Complete by: As directed    No wound care   Complete by: As directed       The results of significant diagnostics from this hospitalization (including imaging, microbiology, ancillary and laboratory) are listed below for reference.    Significant Diagnostic Studies: DG Chest 1  View  Result Date: 01/27/2020 CLINICAL DATA:  Central venous catheter placement EXAM: CHEST  1 VIEW COMPARISON:  Radiograph 01/26/2020 FINDINGS: Right IJ approach central venous catheter tip terminates near the superior cavoatrial junction accounting for slight right anterior obliquity of the projection. There are diffuse mixed interstitial and hazy opacities with a basilar and perihilar predominance as well as fissural and septal thickening and cardiomegaly which may suggest features of heart failure with vascular congestion and likely developing edema. No visible pneumothorax or effusion. The aorta is calcified. The remaining cardiomediastinal contours are unremarkable. No acute osseous or soft tissue abnormality. Degenerative changes are present in the imaged spine and shoulders. Telemetry leads overlie the chest. IMPRESSION: 1. Right IJ approach central venous catheter tip terminates near the superior cavoatrial junction. 2. Findings suggesting CHF with edema and cardiomegaly. Electronically Signed   By: Lovena Le M.D.   On: 01/27/2020 21:53   DG Chest 2 View  Result Date: 02/04/2020 CLINICAL DATA:  79 year old male with shortness of breath. EXAM: CHEST - 2 VIEW COMPARISON:  Chest radiograph dated 01/27/2020. FINDINGS: Interval removal of the right IJ central venous line. There is  mild diffuse interstitial edema. No consolidative changes. There is no pleural effusion pneumothorax. Stable moderate enlargement of the cardiopericardial silhouette. Atherosclerotic calcification of the aorta. Coronary vascular calcification or stent. No acute osseous pathology. IMPRESSION: 1. Interval removal of the right IJ central venous line. 2. Slight interval improvement of the edema since the prior radiograph. 3. Stable cardiomegaly. Electronically Signed   By: Anner Crete M.D.   On: 02/04/2020 22:46   CARDIAC CATHETERIZATION  Result Date: 01/24/2020 Findings: On milrinone 0.25 RA = 16 RV = 59/17 PA = 58/30  (43) PCW = 28 Fick cardiac output/index = 4.9/2.5 PVR = 3.0 WU FA sat = 96% PA sat = 57%, 58% PaPI = 2.6 Assessment: 1. Marked volume overload with improved cardiac output on milrinone Plan/Discussion: Leave swan in. Continue milrinone and diuresis. Glori Bickers, MD 4:49 PM  US RENAL  Result Date: 02/05/2020 CLINICAL DATA:  Acute kidney injury EXAM: RENAL / URINARY TRACT ULTRASOUND COMPLETE COMPARISON:  None. FINDINGS: Right Kidney: Renal measurements: 9.5 x 3.9 x 2.7 cm = volume: 52 mL. Increased echogenicity seen throughout. No mass or hydronephrosis visualized. Left Kidney: Renal measurements: 10.0 x 4.9 x 3.4 = volume: 86 mL. Increased echogenicity seen throughout. No mass or hydronephrosis visualized. Bladder: Appears normal for degree of bladder distention. Other: There is a small amount of abdominopelvic ascites seen. IMPRESSION: Diffusely increased parenchymal echogenicity, consistent with medical renal disease. Small amount of abdominopelvic ascites. Electronically Signed   By: Prudencio Pair M.D.   On: 02/05/2020 04:47   DG CHEST PORT 1 VIEW  Result Date: 01/26/2020 CLINICAL DATA:  Respiratory distress, chest pain EXAM: PORTABLE CHEST 1 VIEW COMPARISON:  02/03/2020 FINDINGS: Cardiomegaly with pulmonary vascular congestion. No frank interstitial edema. No pleural effusion or pneumothorax. Right IJ venous catheter terminates in the upper right atrium. Thoracic aortic atherosclerosis. IMPRESSION: Cardiomegaly with pulmonary vascular congestion. No frank interstitial edema. Right IJ venous catheter terminates in the upper right atrium. Electronically Signed   By: Julian Hy M.D.   On: 01/26/2020 07:43   DG CHEST PORT 1 VIEW  Result Date: 01/24/2020 CLINICAL DATA:  Shortness of breath.  Cardiac disease. EXAM: PORTABLE CHEST 1 VIEW COMPARISON:  11/25/2019 FINDINGS: Enlarged cardiac silhouette that could be due to cardiomegaly and/or pericardial fluid. Coronary artery calcification and stents.  Aortic atherosclerotic calcification. Pulmonary venous hypertension without frank edema. No infiltrate, effusion or collapse. IMPRESSION: Enlarged cardiac silhouette. Aortic atherosclerosis. Coronary artery calcification. Pulmonary venous hypertension without frank edema. Electronically Signed   By: Nelson Chimes M.D.   On: 01/24/2020 11:53   DG Foot Complete Left  Result Date: 02/04/2020 CLINICAL DATA:  Necrotic toe EXAM: LEFT FOOT - COMPLETE 3+ VIEW COMPARISON:  None. FINDINGS: There is no evidence of fracture or dislocation. No definite area of cortical destruction, however somewhat limited views of the distal phalanges due to flexion deformity. There is diffuse osteopenia. Dorsal soft tissue swelling. Cataract vascular calcifications are noted. First MTP joint osteoarthritis is noted. IMPRESSION: No definite acute osseous abnormality. Electronically Signed   By: Prudencio Pair M.D.   On: 02/04/2020 23:50    Microbiology: Recent Results (from the past 240 hour(s))  SARS Coronavirus 2 by RT PCR (hospital order, performed in [facility] hospital lab) Nasopharyngeal Nasopharyngeal Swab     Status: None   Collection Time: 02/05/20  1:21 AM   Specimen: Nasopharyngeal Swab  Result Value Ref Range Status   SARS Coronavirus 2 NEGATIVE NEGATIVE Final    Comment: (NOTE) SARS-CoV-2 target nucleic acids  are NOT DETECTED. The SARS-CoV-2 RNA is generally detectable in upper and lower respiratory specimens during the acute phase of infection. The lowest concentration of SARS-CoV-2 viral copies this assay can detect is 250 copies / mL. A negative result does not preclude SARS-CoV-2 infection and should not be used as the sole basis for treatment or other patient management decisions.  A negative result may occur with improper specimen collection / handling, submission of specimen other than nasopharyngeal swab, presence of viral mutation(s) within the areas targeted by this assay, and inadequate number of  viral copies (<250 copies / mL). A negative result must be combined with clinical observations, patient history, and epidemiological information. Fact Sheet for Patients:   StrictlyIdeas.no Fact Sheet for Healthcare Providers: BankingDealers.co.za This test is not yet approved or cleared  by the [place] FDA and has been authorized for detection and/or diagnosis of SARS-CoV-2 by FDA under an Emergency Use Authorization (EUA).  This EUA will remain in effect (meaning this test can be used) for the duration of the COVID-19 declaration under Section 564(b)(1) of the Act, 21 U.S.C. section 360bbb-3(b)(1), unless the authorization is terminated or revoked sooner. Performed at [facility], [place] [address], [place], [place] [address]      Labs: CBC: Recent Labs  Lab 02/05/20 [phone] 02/06/20 0208 02/07/20 0252 02/08/20 0228 02/09/20 0506  WBC 13.5* 11.1* 9.5 8.2 7.9  HGB 9.7* 9.6* 8.9* 9.1* 9.5*  HCT 28.5* 27.6* 25.8* 26.1* 27.6*  MCV 74.2* 72.8* 72.3* 71.9* 73.0*  PLT 369 430* 384 409* 008   Basic Metabolic Panel: Recent Labs  Lab 02/05/20 0735 02/06/20 0208 02/07/20 0252 02/08/20 0228 02/09/20 0506  NA 126* 128* 131* 129* 133*  K 5.5* 4.6 4.2 3.6 3.4*  CL 94* 94* 95* 95* 95*  CO2 19* 24 23 24 25   GLUCOSE 90 125* 145* 96 127*  BUN 59* 57* 50* 43* 36*  CREATININE 3.65* 3.17* 2.53* 2.18* 1.85*  CALCIUM 8.6* 8.6* 8.2* 8.1* 8.6*  PHOS 3.4  --   --   --   --    Liver Function Tests: Recent Labs  Lab 02/05/20 0735  ALBUMIN 2.2*   No results for input(s): LIPASE, AMYLASE in the last 168 hours. No results for input(s): AMMONIA in the last 168 hours. Cardiac Enzymes: No results for input(s): CKTOTAL, CKMB, CKMBINDEX, TROPONINI in the last 168 hours. BNP (last 3 results) Recent Labs    12/26/19 1515 01/22/20 1850 02/04/20 1845  BNP 1,843.7* 2,557.6* 1,863.4*   CBG: No results for input(s): GLUCAP in the last  168 hours.  Time spent: 35 minutes  Signed:  Berle Mull  [facility]  02/10/2020 12:56 PM

## 2020-02-10 NOTE — TOC Transition Note (Signed)
Transition of Care [facility]) - CM/SW Discharge Note   Patient Details  Name: Logan White MRN: [id number] Date of Birth: 31-Oct-1940  [facility]) CM/SW Contact:  Zenon Mayo, RN Phone Number: 02/10/2020, 1:02 PM   Clinical Narrative:    Patient for dc home with hospice with [facility] today via [facility].  [facility] has been called.  Staff RN Suezanne Jacquet aware. Ambulance forms on chart.    Final next level of care: Home w Hospice Care Barriers to Discharge: No Barriers Identified   Patient Goals and CMS Choice Patient states their goals for this hospitalization and ongoing recovery are:: home with hospice CMS Medicare.gov Compare Post Acute Care list provided to:: Other (Comment Required) Choice offered to / list presented to : Spouse  Discharge Placement                       Discharge Plan and Services   Discharge Planning Services: CM Consult              DME Agency: (Hospice will arrange DME)       HH Arranged: RN [place] Agency: Lonia Chimera) Date [place]: 02/10/20 Time [place]: [place] Representative spoke with at [place]: [place] ([place]) Interventions     Readmission Risk Interventions Readmission Risk Prevention Plan 01/30/2020 01/24/2020 12/04/2019  Transportation Screening Complete Complete Complete  PCP or Specialist Appt within 3-5 Days - - -  HRI or [place] Work Consult for [place] Planning/Counseling - - -  [place] - - -  Medication Review Press photographer) Complete Complete Complete  PCP or Specialist appointment within 3-5 days of discharge - Complete Complete  HRI or [place] Complete Complete Complete  SW Recovery Care/Counseling Consult Complete Complete Complete  Palliative Care Screening Not Applicable Not Applicable Not [place] Not Applicable Complete Not Applicable  Some recent data might be  hidden

## 2020-02-10 NOTE — TOC Progression Note (Addendum)
Transition of Care [facility]) - Progression Note    Patient Details  Name: Logan White MRN: [id number] Date of Birth: November 29, 1940  Transition of Care [facility]) CM/SW Contact  Zenon Mayo, RN Phone Number: 02/10/2020, 9:21 AM  Clinical Narrative:    NCM spoke with wife, she states , the DME has not been delivered yet.  NCM informed her that as soon as DME is delivered will tranport patient home via [place], address confirmed.  NCM left VM for Maryanne with [facility]. Awaiting call back. NCM received call from [facility] with [facility] , she is working on getting the DME.  NCM spoke with wife and she states to go ahead and call ptar transport because patient can lay on the recliner or the chase recliner until the bed gets there. NCM informed Maryann with [facility].    Expected Discharge Plan: Home w Hospice Care Barriers to Discharge: Continued Medical Work up  Expected Discharge Plan and Services Expected Discharge Plan: [place]   Discharge Planning Services: CM Consult   Living arrangements for the past 2 months: [place]: Hospice and [place] Date [place]: 02/09/20 Time [place]: 0092 Representative spoke with at [place]: [place] ([place]) Interventions    Readmission Risk Interventions Readmission Risk Prevention Plan 01/30/2020 01/24/2020 12/04/2019  Transportation Screening Complete Complete Complete  PCP or Specialist Appt within 3-5 Days - - -  HRI or [place] Work Consult for [place] Planning/Counseling - - -  [place] - - -  Medication Review Press photographer) Complete Complete Complete  PCP or Specialist appointment within 3-5 days of discharge - Complete Complete  HRI or [place] Complete Complete Complete  SW Recovery Care/Counseling Consult Complete Complete  Complete  Palliative Care Screening Not Applicable Not Applicable Not [place] Not Applicable Complete Not Applicable  Some recent data might be hidden

## 2020-02-10 NOTE — Consult Note (Signed)
   [facility] Inpatient Consult   02/10/2020  DYSON SEVEY [birth year] [id number]   [place] Patient: [facility] Medicare  Patient is currently active with [place] Management for chronic disease management services.  Patient has been engaged by a [place] Management Coordinator.   Our community based plan of care has focused on disease management and community resource support.    Plan: Chart review reveals patient is for home with Hospice is recommended per Palliative Care consult notes. Will update [facility] RNCM and close case as the patient will have full care management under Hospice care.  Of note, [facility] Care Management services does not replace or interfere with any services that are needed or arranged by inpatient [facility] care management team.  For additional questions or referrals please contact:  Natividad Brood, RN BSN [place] [facility] Liaison  [phone] business mobile phone Toll free office [phone]  Fax number: [phone] [email or web address]

## 2020-02-10 NOTE — Plan of Care (Signed)
  Problem: Education: Goal: Knowledge of General Education information will improve Description: Including pain rating scale, medication(s)/side effects and non-pharmacologic comfort measures Outcome: Progressing   Problem: Clinical Measurements: Goal: Cardiovascular complication will be avoided Outcome: Progressing   Problem: Nutrition: Goal: Adequate nutrition will be maintained Outcome: Progressing   Problem: Coping: Goal: Level of anxiety will decrease Outcome: Progressing   Problem: Pain Managment: Goal: General experience of comfort will improve Outcome: Progressing   

## 2020-02-11 ENCOUNTER — Other Ambulatory Visit: Payer: Self-pay | Admitting: *Deleted

## 2020-02-11 NOTE — Patient Outreach (Signed)
[facility]) Care Management  02/11/2020  LAVONTA TILLIS [birth year] [id number]   Case Closure    Outreach call to patient wife, Nana [place], she discussed patient recent hospital admission and plans for comfort care. She discussed patient being high risk for surgery.  She verifies home  visit from [place] on yesterday to admit to Hospice services. Emotional support provided.  Explained to Mrs. [facility] services with will now be managing his care , [facility] care management services with close, she voiced understanding.     Plan. Will plan case closure  Will send PCP case closure letter    Joylene Draft, RN, BSN  [place] Management Coordinator  [phone]- Mobile [phone]- [place]

## 2020-02-15 DIAGNOSIS — Z7189 Other specified counseling: Secondary | ICD-10-CM | POA: Diagnosis not present

## 2020-02-24 ENCOUNTER — Ambulatory Visit: Payer: Medicare HMO

## 2020-02-24 ENCOUNTER — Inpatient Hospital Stay (HOSPITAL_COMMUNITY): Admission: RE | Admit: 2020-02-24 | Payer: Medicare HMO | Source: Ambulatory Visit | Admitting: Internal Medicine

## 2020-02-26 ENCOUNTER — Ambulatory Visit: Payer: Medicare HMO | Admitting: Gastroenterology

## 2020-02-28 DIAGNOSIS — I10 Essential (primary) hypertension: Secondary | ICD-10-CM | POA: Diagnosis not present

## 2020-02-28 DIAGNOSIS — I509 Heart failure, unspecified: Secondary | ICD-10-CM | POA: Diagnosis not present

## 2020-03-04 DIAGNOSIS — I5023 Acute on chronic systolic (congestive) heart failure: Secondary | ICD-10-CM | POA: Diagnosis not present

## 2020-03-05 DIAGNOSIS — R4020 Unspecified coma: Secondary | ICD-10-CM | POA: Diagnosis not present

## 2020-03-05 DIAGNOSIS — I499 Cardiac arrhythmia, unspecified: Secondary | ICD-10-CM | POA: Diagnosis not present

## 2020-04-05 DIAGNOSIS — 419620001 Death: Secondary | SNOMED CT | POA: Diagnosis not present

## 2020-04-05 DEATH — deceased

## 2021-01-28 IMAGING — DX DG CHEST 1V PORT
1 series · 1 of 1 positions shown · non-contrast
Comparison: The single-view of the chest 11/04/2019 and 09/28/2019.

CLINICAL DATA: Shortness of breath and bilateral leg swelling for a
few days.

EXAM:
PORTABLE CHEST 1 VIEW

[chest ap]
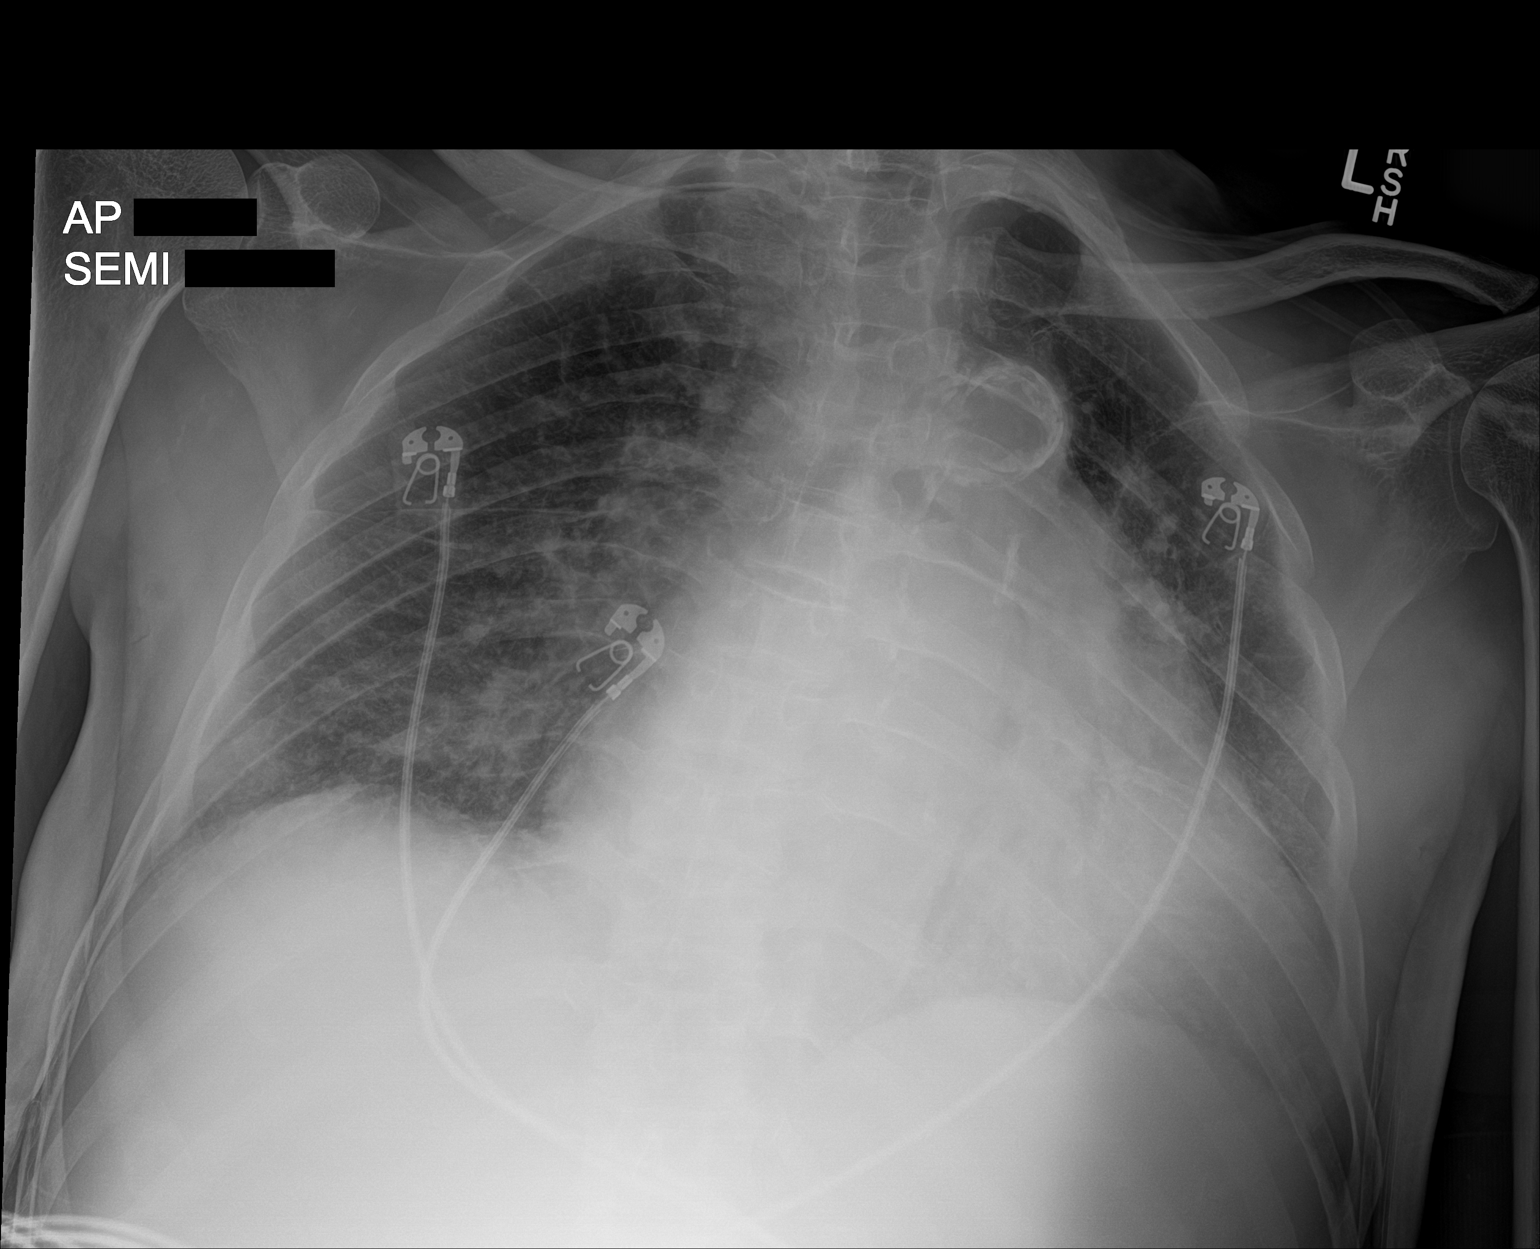

[1 of 1 positions shown; findings below may reference images not displayed]

FINDINGS: There is cardiomegaly mild interstitial edema. Extensive
atherosclerosis. No pneumothorax or pleural fluid. No acute bony
abnormality.
IMPRESSION: Cardiomegaly and interstitial edema.

Atherosclerosis.
